# Patient Record
Sex: Male | Born: 1955
Health system: Southern US, Community
[De-identification: ages and names within clinical notes are randomized; demographics above are authoritative.]

## PROBLEM LIST (undated history)

## (undated) DIAGNOSIS — L219 Seborrheic dermatitis, unspecified: Secondary | ICD-10-CM

## (undated) DIAGNOSIS — N529 Male erectile dysfunction, unspecified: Secondary | ICD-10-CM

## (undated) DIAGNOSIS — Z8669 Personal history of other diseases of the nervous system and sense organs: Secondary | ICD-10-CM

## (undated) DIAGNOSIS — Z9989 Dependence on other enabling machines and devices: Secondary | ICD-10-CM

## (undated) DIAGNOSIS — E669 Obesity, unspecified: Secondary | ICD-10-CM

## (undated) DIAGNOSIS — Z8701 Personal history of pneumonia (recurrent): Secondary | ICD-10-CM

## (undated) DIAGNOSIS — K439 Ventral hernia without obstruction or gangrene: Secondary | ICD-10-CM

## (undated) DIAGNOSIS — G4733 Obstructive sleep apnea (adult) (pediatric): Secondary | ICD-10-CM

## (undated) DIAGNOSIS — K529 Noninfective gastroenteritis and colitis, unspecified: Secondary | ICD-10-CM

## (undated) DIAGNOSIS — I10 Essential (primary) hypertension: Secondary | ICD-10-CM

## (undated) DIAGNOSIS — F909 Attention-deficit hyperactivity disorder, unspecified type: Secondary | ICD-10-CM

## (undated) DIAGNOSIS — E119 Type 2 diabetes mellitus without complications: Secondary | ICD-10-CM

## (undated) DIAGNOSIS — E785 Hyperlipidemia, unspecified: Secondary | ICD-10-CM

## (undated) DIAGNOSIS — R030 Elevated blood-pressure reading, without diagnosis of hypertension: Secondary | ICD-10-CM

## (undated) DIAGNOSIS — K5090 Crohn's disease, unspecified, without complications: Secondary | ICD-10-CM

## (undated) DIAGNOSIS — K635 Polyp of colon: Secondary | ICD-10-CM

## (undated) DIAGNOSIS — K649 Unspecified hemorrhoids: Secondary | ICD-10-CM

## (undated) DIAGNOSIS — L02215 Cutaneous abscess of perineum: Secondary | ICD-10-CM

## (undated) DIAGNOSIS — Z973 Presence of spectacles and contact lenses: Secondary | ICD-10-CM

## (undated) DIAGNOSIS — I82401 Acute embolism and thrombosis of unspecified deep veins of right lower extremity: Secondary | ICD-10-CM

## (undated) DIAGNOSIS — J189 Pneumonia, unspecified organism: Secondary | ICD-10-CM

## (undated) DIAGNOSIS — G629 Polyneuropathy, unspecified: Secondary | ICD-10-CM

## (undated) DIAGNOSIS — R740 Nonspecific elevation of levels of transaminase and lactic acid dehydrogenase [LDH]: Secondary | ICD-10-CM

## (undated) DIAGNOSIS — R809 Proteinuria, unspecified: Secondary | ICD-10-CM

## (undated) DIAGNOSIS — E1129 Type 2 diabetes mellitus with other diabetic kidney complication: Secondary | ICD-10-CM

## (undated) DIAGNOSIS — F419 Anxiety disorder, unspecified: Secondary | ICD-10-CM

## (undated) HISTORY — DX: Seborrheic dermatitis, unspecified: L21.9

## (undated) HISTORY — PX: EYE SURGERY: SHX253

## (undated) HISTORY — DX: Cutaneous abscess of perineum: L02.215

## (undated) HISTORY — DX: Male erectile dysfunction, unspecified: N52.9

## (undated) HISTORY — DX: Anxiety disorder, unspecified: F41.9

## (undated) HISTORY — PX: COLONOSCOPY W/ POLYPECTOMY: SHX1380

## (undated) HISTORY — PX: TRANSTHORACIC ECHOCARDIOGRAM: SHX275

## (undated) HISTORY — DX: Elevated blood-pressure reading, without diagnosis of hypertension: R03.0

## (undated) HISTORY — DX: Hyperlipidemia, unspecified: E78.5

## (undated) HISTORY — DX: Attention-deficit hyperactivity disorder, unspecified type: F90.9

## (undated) HISTORY — DX: Dependence on other enabling machines and devices: Z99.89

## (undated) HISTORY — DX: Type 2 diabetes mellitus without complications: E11.9

## (undated) HISTORY — DX: Nonspecific elevation of levels of transaminase and lactic acid dehydrogenase (ldh): R74.0

## (undated) HISTORY — DX: Proteinuria, unspecified: R80.9

## (undated) HISTORY — DX: Proteinuria, unspecified: E11.29

## (undated) HISTORY — DX: Polyp of colon: K63.5

## (undated) HISTORY — DX: Obesity, unspecified: E66.9

## (undated) HISTORY — PX: MOUTH SURGERY: SHX715

## (undated) HISTORY — DX: Essential (primary) hypertension: I10

## (undated) HISTORY — DX: Obstructive sleep apnea (adult) (pediatric): G47.33

## (undated) HISTORY — DX: Unspecified hemorrhoids: K64.9

## (undated) HISTORY — DX: Crohn's disease, unspecified, without complications: K50.90

## (undated) HISTORY — DX: Noninfective gastroenteritis and colitis, unspecified: K52.9

## (undated) HISTORY — DX: Acute embolism and thrombosis of unspecified deep veins of right lower extremity: I82.401

## (undated) HISTORY — DX: Personal history of other diseases of the nervous system and sense organs: Z86.69

---

## 2005-10-08 ENCOUNTER — Emergency Department (HOSPITAL_COMMUNITY): Admission: EM | Admit: 2005-10-08 | Discharge: 2005-10-08 | Payer: Self-pay | Admitting: Family Medicine

## 2006-01-10 ENCOUNTER — Ambulatory Visit: Payer: Self-pay | Admitting: Internal Medicine

## 2006-12-09 ENCOUNTER — Ambulatory Visit: Payer: Self-pay | Admitting: Internal Medicine

## 2009-02-24 ENCOUNTER — Telehealth (INDEPENDENT_AMBULATORY_CARE_PROVIDER_SITE_OTHER): Payer: Self-pay | Admitting: *Deleted

## 2009-03-06 DIAGNOSIS — L259 Unspecified contact dermatitis, unspecified cause: Secondary | ICD-10-CM

## 2009-03-06 DIAGNOSIS — J309 Allergic rhinitis, unspecified: Secondary | ICD-10-CM

## 2009-03-07 ENCOUNTER — Ambulatory Visit: Payer: Self-pay | Admitting: Internal Medicine

## 2009-03-07 DIAGNOSIS — F909 Attention-deficit hyperactivity disorder, unspecified type: Secondary | ICD-10-CM

## 2010-02-06 ENCOUNTER — Ambulatory Visit: Payer: Self-pay | Admitting: Internal Medicine

## 2010-02-06 ENCOUNTER — Encounter: Payer: Self-pay | Admitting: Gastroenterology

## 2010-02-06 DIAGNOSIS — G479 Sleep disorder, unspecified: Secondary | ICD-10-CM | POA: Insufficient documentation

## 2010-02-06 LAB — CONVERTED CEMR LAB
ALT: 39 units/L (ref 0–53)
AST: 24 units/L (ref 0–37)
BUN: 17 mg/dL (ref 6–23)
Bilirubin Urine: NEGATIVE
CO2: 25 meq/L (ref 19–32)
Calcium: 9.5 mg/dL (ref 8.4–10.5)
Chloride: 103 meq/L (ref 96–112)
Cholesterol: 203 mg/dL — ABNORMAL HIGH (ref 0–200)
Creatinine, Ser: 0.8 mg/dL (ref 0.4–1.5)
Glucose, Bld: 105 mg/dL — ABNORMAL HIGH (ref 70–99)
Ketones, ur: NEGATIVE mg/dL
Leukocytes, UA: NEGATIVE
Lymphocytes Relative: 23.4 % (ref 12.0–46.0)
Lymphs Abs: 1.9 10*3/uL (ref 0.7–4.0)
MCHC: 35 g/dL (ref 30.0–36.0)
MCV: 98.9 fL (ref 78.0–100.0)
Neutro Abs: 5.3 10*3/uL (ref 1.4–7.7)
Neutrophils Relative %: 64.7 % (ref 43.0–77.0)
RDW: 12.7 % (ref 11.5–14.6)
TSH: 1.18 microintl units/mL (ref 0.35–5.50)
Total Bilirubin: 0.6 mg/dL (ref 0.3–1.2)
Total Protein, Urine: NEGATIVE mg/dL
Triglycerides: 85 mg/dL (ref 0.0–149.0)
pH: 5.5 (ref 5.0–8.0)

## 2010-02-07 ENCOUNTER — Telehealth (INDEPENDENT_AMBULATORY_CARE_PROVIDER_SITE_OTHER): Payer: Self-pay | Admitting: *Deleted

## 2010-02-22 ENCOUNTER — Ambulatory Visit (HOSPITAL_BASED_OUTPATIENT_CLINIC_OR_DEPARTMENT_OTHER)
Admission: RE | Admit: 2010-02-22 | Discharge: 2010-02-22 | Payer: Self-pay | Source: Home / Self Care | Attending: Internal Medicine | Admitting: Internal Medicine

## 2010-03-07 ENCOUNTER — Encounter (INDEPENDENT_AMBULATORY_CARE_PROVIDER_SITE_OTHER): Payer: Self-pay | Admitting: *Deleted

## 2010-03-07 ENCOUNTER — Telehealth (INDEPENDENT_AMBULATORY_CARE_PROVIDER_SITE_OTHER): Payer: Self-pay | Admitting: *Deleted

## 2010-03-13 ENCOUNTER — Ambulatory Visit: Payer: Self-pay | Admitting: Gastroenterology

## 2010-03-20 ENCOUNTER — Encounter: Payer: Self-pay | Admitting: Internal Medicine

## 2010-03-23 ENCOUNTER — Ambulatory Visit: Admit: 2010-03-23 | Payer: Self-pay | Admitting: Gastroenterology

## 2010-03-28 ENCOUNTER — Encounter: Payer: Self-pay | Admitting: Internal Medicine

## 2010-03-30 ENCOUNTER — Ambulatory Visit
Admission: RE | Admit: 2010-03-30 | Discharge: 2010-03-30 | Payer: Self-pay | Source: Home / Self Care | Attending: Pulmonary Disease | Admitting: Pulmonary Disease

## 2010-03-30 DIAGNOSIS — G4733 Obstructive sleep apnea (adult) (pediatric): Secondary | ICD-10-CM | POA: Insufficient documentation

## 2010-04-17 NOTE — Progress Notes (Signed)
Summary: ? ritalin rx  ---- Converted from flag ---- ---- 02/07/2010 6:58 AM, Nyoka Cowden MD wrote: I forgot to give him the info on the phone tree - I put all the info on it ------------------------------  Prescriptions: RITALIN SR 20 MG CR-TABS (METHYLPHENIDATE HCL) one twice daily as needed  #60 x 0   Entered by:   Vernie Murders   Authorized by:   Nyoka Cowden MD   Signed by:   Vernie Murders on 02/09/2010   Method used:   Print then Give to Patient   RxID:   [id number]  I spoke with pt and made aware that all results are on PT and gave number to call.  He verbalized understanding.  He states that he was supposed to have been given rx for ritalin at [facility] and this was not done.  Pls advise thanks! Vernie Murders  February 07, 2010 12:08 PM ok to fill x  one year \  Appended Document: ? ritalin rx Rx mailed to pt per his request.

## 2010-04-17 NOTE — Letter (Signed)
Summary: Pre Visit Letter Revised  [facility]  [address] [place], [place] [address]   Phone: [phone]  Fax: [phone]        02/06/2010 MRN: [id number]  Richard [name] [address] [place], [place]  [address]             Procedure Date:  03-23-2010 9am           Dr Arlyce Dice   Welcome to the Gastroenterology Division at [facility].    You are scheduled to see a nurse for your pre-procedure visit on 03-13-10 at 10:30am on the 3rd floor at [facility], [address]  We ask that you try to arrive at our office 15 minutes prior to your appointment time to allow for check-in.  Please take a minute to review the attached form.  If you answer "Yes" to one or more of the questions on the first page, we ask that you call the person listed at your earliest opportunity.  If you answer "No" to all of the questions, please complete the rest of the form and bring it to your appointment.    Your nurse visit will consist of discussing your medical and surgical history, your immediate family medical history, and your medications.   If you are unable to list all of your medications on the form, please bring the medication bottles to your appointment and we will list them.  We will need to be aware of both prescribed and over the counter drugs.  We will need to know exact dosage information as well.    Please be prepared to read and sign documents such as consent forms, a financial agreement, and acknowledgement forms.  If necessary, and with your consent, a friend or relative is welcome to sit-in on the nurse visit with you.  Please bring your insurance card so that we may make a copy of it.  If your insurance requires a referral to see a specialist, please bring your referral form from your primary care physician.  No co-pay is required for this nurse visit.     If you cannot keep your appointment, please call [phone] to cancel or reschedule prior to your  appointment date.  This allows [place] the opportunity to schedule an appointment for another patient in need of care.    Thank you for choosing [facility] for your medical needs.  We appreciate the opportunity to care for you.  Please visit [place] at our website  to learn more about our practice.  Sincerely, The Gastroenterology Division

## 2010-04-17 NOTE — Assessment & Plan Note (Signed)
Summary: Primary svc/ cpx    Primary Provider/Referring Provider:  Sherene Sires  CC:  Rash-resolved.  [name] Kitchen  History of Present Illness: 71  yowm with moderate obesity and h/o mild eczema.  March 07, 2009  to re-establish Acute visit.  Pt c/o itchy rash on left wrist x 2 wks.  Also concerned about "knot" on right  knee.  Also he has noticed some numbess in right hand x 6 months- relates to working with computers.     February 06, 2010 cpx  cc excessive sleepiness, no cp or sob. Pt denies any significant sore throat, dysphagia, itching, sneezing,  nasal congestion or excess secretions,  fever, chills, sweats, unintended wt loss, pleuritic or exertional cp, hempoptysis, change in activity tolerance  orthopnea pnd or leg swelling   Current Medications (verified): 1)  Ritalin Sr 20 Mg Cr-Tabs (Methylphenidate Hcl) .... One Twice Daily As Needed 2)  Triamcinolone Acetonide 0.1 % Crea (Triamcinolone Acetonide) .... Apply Twice Daily As Needed  Allergies (verified): No Known Drug Allergies  Past History:  Past Medical History: ALLERGIC RHINITIS (ICD-477.9) DERMATITIS (ICD-692.9) OBESITY    - Target wt  =   208  for BMI < 30  ? OSA      - PSS ordered February 06, 2010  Health Maintenance...................................[name] KitchenWert     - Td February 06, 2010     - Colonoscopy ordered February 06, 2010      - CPX February 06, 2010 declined  psa study  Family History: Reviewed history from 03/06/2009 and no changes required. cancer: father (lung)   Social History: Reviewed history from 03/07/2009 and no changes required. Patient states former smoker.  Quit May 2006. Pt works as an Special educational needs teacher. ETOH daily Married  Vital Signs:  Patient profile:   55 year old male Weight:      268 pounds BMI:     37.51 O2 Sat:      93 % on Room air Pulse rate:   115 / minute BP sitting:   128 / 80  (left arm)  Vitals Entered By: Vernie Murders (February 06, 2010 10:40 AM)  O2 Flow:  Room  air  Physical Exam  Additional Exam:   Ambulatory obese wm  in no acute distress.  wt 265 > 268 February 06, 2010  HEENT: nl dentition, turbinates, and orophanx. Nl external ear canals without cough reflex Neck without JVD/Nodes/TM Lungs clear to A and P bilaterally without cough on insp or exp maneuvers RRR no s3 or murmur or increase in P2 Abd soft and benign with nl excursion in the supine position. No bruits or organomegaly Ext warm without calf tenderness, cyanosis clubbing or edema Skin warm and dry with seb keratoses Neuro nl sensorium, no nl gait, no motor deficits MS  no joint limitations.   Cholesterol          [H]  203 mg/dL                   1-610     ATP III Classification            Desirable:  < 200 mg/dL                    Borderline High:  200 - 239 mg/dL               High:  > = 240 mg/dL   Triglycerides             85.0 mg/dL  0.0-149.0     Normal:  <150 mg/dL     Borderline High:  161 - 199 mg/dL   HDL                       09.60 mg/dL                 >45.40   VLDL Cholesterol          17.0 mg/dL                  9.8-11.9  CHO/HDL Ratio:  CHD Risk                             4                    Men          Women     1/2 Average Risk     3.4          3.3     Average Risk          5.0          4.4     2X Average Risk          9.6          7.1     3X Average Risk          15.0          11.0                           Tests: (2) BMP (METABOL)   Sodium                    138 mEq/L                   135-145   Potassium                 4.5 mEq/L                   3.5-5.1   Chloride                  103 mEq/L                   96-112   Carbon Dioxide            25 mEq/L                    19-32   Glucose              [H]  105 mg/dL                   14-78   BUN                       17 mg/dL                    2-95   Creatinine                0.8 mg/dL                   6.2-1.3   Calcium  9.5 mg/dL                   5.7-32.2    GFR                       110.31 mL/min               >60  Tests: (3) CBC Platelet w/Diff (CBCD)   White Cell Count          8.1 K/uL                    4.5-10.5   Red Cell Count            4.61 Mil/uL                 4.22-5.81   Hemoglobin                15.9 g/dL                   02.5-42.7   Hematocrit                45.6 %                      39.0-52.0   MCV                       98.9 fl                     78.0-100.0   MCHC                      35.0 g/dL                   06.2-37.6   RDW                       12.7 %                      11.5-14.6   Platelet Count            242.0 K/uL                  150.0-400.0   Neutrophil %              64.7 %                      43.0-77.0   Lymphocyte %              23.4 %                      12.0-46.0   Monocyte %                8.7 %                       3.0-12.0   Eosinophils%              2.8 %                       0.0-5.0   Basophils %               0.4 %  0.0-3.0   Neutrophill Absolute      5.3 K/uL                    1.4-7.7   Lymphocyte Absolute       1.9 K/uL                    0.7-4.0   Monocyte Absolute         0.7 K/uL                    0.1-1.0  Eosinophils, Absolute                             0.2 K/uL                    0.0-0.7   Basophils Absolute        0.0 K/uL                    0.0-0.1  Tests: (4) Hepatic/Liver Function Panel (HEPATIC)   Total Bilirubin           0.6 mg/dL                   0.3-4.7   Direct Bilirubin          0.1 mg/dL                   4.2-5.9   Alkaline Phosphatase      67 U/L                      39-117   AST                       24 U/L                      0-37   ALT                       39 U/L                      0-53   Total Protein             7.0 g/dL                    5.6-3.8   Albumin                   4.1 g/dL                    7.5-6.4  Tests: (5) TSH (TSH)   FastTSH                   1.18 uIU/mL                 0.35-5.50  Tests: (6) UDip Only (UDIP)   Color                      LT. YELLOW       RANGE:  Yellow;Lt. Yellow   Clarity                   CLEAR  Clear   Specific Gravity          1.025                       1.000 - 1.030   Urine Ph                  5.5                         5.0-8.0   Protein                   NEGATIVE                    Negative   Urine Glucose             NEGATIVE                    Negative   Ketones                   NEGATIVE                    Negative   Urine Bilirubin           NEGATIVE                    Negative   Blood                     NEGATIVE                    Negative   Urobilinogen              0.2                         0.0 - 1.0   Leukocyte Esterace        NEGATIVE                    Negative   Nitrite                   NEGATIVE                    Negative  Tests: (7) Cholesterol LDL - Direct (DIRLDL)  Cholesterol LDL - Direct                             131.3 mg/dL     Optimal:  <295 mg/dL     Near or Above Optimal:  100-129 mg/dL     Borderline High:  621-308 mg/dL     High:  657-846 mg/dL     Very High:  >962 mg/dL  CXR  Procedure date:  02/06/2010  Findings:        Findings: There is cardiomegaly but no edema.  Lungs are clear.  No effusion.   IMPRESSION: Cardiomegaly without acute disease.  Impression & Recommendations:  Problem # 1:  ATTENTION DEFICIT DISORDER, ADULT (ICD-314.00) ok to continue rx  Problem # 2:  SLEEP DISORDER UNSPECIFIED (ICD-780.50)  needs formal sleep study  Problem # 3:  MORBID OBESITY (ICD-278.01)  Weight control is a matter of calorie balance which needs to be tilted in the pt's favor by eating less and exercising more.  Specifically, I recommended  exercise  at a level where pt  is short of breath but not out of breath 30 minutes daily.  If not losing weight on this program, I would strongly recommend pt see a nutritionist with a food diary recorded for two weeks prior to the visit.     Medications Added to Medication List This  Visit: 1)  Ritalin Sr 20 Mg Cr-tabs (Methylphenidate hcl) .... One twice daily as needed 2)  Triamcinolone Acetonide 0.1 % Crea (Triamcinolone acetonide) .... Apply twice daily as needed  Other Orders: Misc. Referral (Misc. Ref) Misc. Referral (Misc. Ref) Tdap => 59yrs IM (40981) Admin 1st Vaccine (19147) Admin 1st Vaccine (State) [phone]) TLB-Lipid Panel (80061-LIPID) TLB-BMP (Basic Metabolic Panel-BMET) (80048-METABOL) TLB-CBC Platelet - w/Differential (85025-CBCD) TLB-Hepatic/Liver Function Pnl (80076-HEPATIC) TLB-TSH (Thyroid Stimulating Hormone) (84443-TSH) TLB-Udip ONLY (81003-UDIP) Est. Patient 40-64 years (13086) T-2 View CXR (71020TC)  Patient Instructions: 1)  Weight control is simply a matter of calorie balance which needs to be tilted in your favor by eating less and exercising more.  To get the most out of exercise, you need to be continuously aware that you are short of breath, but never out of breath, for 30 minutes daily. As you improve, it will actually be easier for you to do the same amount in  30 minutes so always push to the level where you are short of breath.  If this does not result in gradual weight reduction,  I recommend  a nutritionist for a food diary 2)  See Patient Care Coordinator before leaving for colonsocopy and sleep study.   Tetanus/Td Vaccine    Vaccine Type: Tdap    Site: left deltoid    Mfr: GlaxoSmithKline    Dose: 0.5 ml    Route: IM    Given by: Michel Bickers CMA    Exp. Date: 06/10/2011    Lot #: [id number]

## 2010-04-19 NOTE — Miscellaneous (Signed)
Summary: [facility] Previsit/prep  Clinical Lists Changes  Medications: Added new medication of MOVIPREP 100 GM  SOLR (PEG-KCL-NACL-NASULF-NA ASC-C) As per prep instructions. - Signed Rx of MOVIPREP 100 GM  SOLR (PEG-KCL-NACL-NASULF-NA ASC-C) As per prep instructions.;  #1 x 0;  Signed;  Entered by: Wyona Almas RN;  Authorized by: Louis Meckel MD;  Method used: Electronically to [facility]  [phone]*, [address] [place], [place], [place]  [address], Ph: [phone] or [phone], Fax: [phone] Observations: Added new observation of NKA: T (03/13/2010 10:21)    Prescriptions: MOVIPREP 100 GM  SOLR (PEG-KCL-NACL-NASULF-NA ASC-C) As per prep instructions.  #1 x 0   Entered by:   Wyona Almas RN   Authorized by:   Louis Meckel MD   Signed by:   Wyona Almas RN on 03/13/2010   Method used:   [facility]ronically to        [facility]  [phone][facility] (retail)       [address]       [place], [place]  [address]       Ph: [phone] or [phone]       Fax: [phone]   RxID:   [id number]

## 2010-04-19 NOTE — Progress Notes (Signed)
Summary: Severe sleep apnea, refer to [facility]  Phone Note Outgoing Call   Summary of Call: let him know he has severe sleep apnea and needs to get started on 10 cm and get set up with [facility] for next available sleep consult Initial call taken by: Nyoka Cowden MD,  March 07, 2010 1:39 PM  Follow-up for Phone Call        appt to see dr clance 03/30/10@1 :30pm pt aware of this appt Follow-up by: Oneita Jolly,  March 07, 2010 3:47 PM

## 2010-04-19 NOTE — Letter (Signed)
Summary: [facility] Instructions  [facility]  [address] [place], [place] [address]   Phone: [phone]  Fax: [phone]       Richard [name]    Mar 21, 1955    MRN: [id number]        Procedure Day Dorna Bloom:  Farrell Ours  04/24/10     Arrival Time: 10:30AM     Procedure Time: 11:30AM     Location of Procedure:                    Juliann Pares _  [facility] (4th Floor)                      PREPARATION FOR COLONOSCOPY WITH MOVIPREP   Starting 5 days prior to your procedure 04/19/10 do not eat nuts, seeds, popcorn, corn, beans, peas,  salads, or any raw vegetables.  Do not take any fiber supplements (e.g. Metamucil, Citrucel, and Benefiber).  THE DAY BEFORE YOUR PROCEDURE         DATE: 04/23/10  DAY: THURSDAY  1.  Drink clear liquids the entire day-NO SOLID FOOD  2.  Do not drink anything colored red or purple.  Avoid juices with pulp.  No orange juice.  3.  Drink at least 64 oz. (8 glasses) of fluid/clear liquids during the day to prevent dehydration and help the prep work efficiently.  CLEAR LIQUIDS INCLUDE: Water Jello Ice Popsicles Tea (sugar ok, no milk/cream) Powdered fruit flavored drinks Coffee (sugar ok, no milk/cream) Gatorade Juice: apple, white grape, white cranberry  Lemonade Clear bullion, consomm, broth Carbonated beverages (any kind) Strained chicken noodle soup Hard Candy                             4.  In the morning, mix first dose of MoviPrep solution:    Empty 1 Pouch A and 1 Pouch B into the disposable container    Add lukewarm drinking water to the top line of the container. Mix to dissolve    Refrigerate (mixed solution should be used within 24 hrs)  5.  Begin drinking the prep at 5:00 p.m. The MoviPrep container is divided by 4 marks.   Every 15 minutes drink the solution down to the next mark (approximately 8 oz) until the full liter is complete.   6.  Follow completed prep with 16 oz of clear liquid of your choice (Nothing red  or purple).  Continue to drink clear liquids until bedtime.  7.  Before going to bed, mix second dose of MoviPrep solution:    Empty 1 Pouch A and 1 Pouch B into the disposable container    Add lukewarm drinking water to the top line of the container. Mix to dissolve    Refrigerate  THE DAY OF YOUR PROCEDURE      DATE: 04/24/10   DAY: FRIDAY  Beginning at 6:30AM (5 hours before procedure):         1. Every 15 minutes, drink the solution down to the next mark (approx 8 oz) until the full liter is complete.  2. Follow completed prep with 16 oz. of clear liquid of your choice.    3. You may drink clear liquids until 9:30AM (2 HOURS BEFORE PROCEDURE).   MEDICATION INSTRUCTIONS  Unless otherwise instructed, you should take regular prescription medications with a small sip of water   as early as possible the morning of your procedure.  OTHER INSTRUCTIONS  You will need a responsible adult at least 54 years of age to accompany you and drive you home.   This person must remain in the waiting room during your procedure.  Wear loose fitting clothing that is easily removed.  Leave jewelry and other valuables at home.  However, you may wish to bring a book to read or  an iPod/MP3 player to listen to music as you wait for your procedure to start.  Remove all body piercing jewelry and leave at home.  Total time from sign-in until discharge is approximately 2-3 hours.  You should go home directly after your procedure and rest.  You can resume normal activities the  day after your procedure.  The day of your procedure you should not:   Drive   Make legal decisions   Operate machinery   Drink alcohol   Return to work  You will receive specific instructions about eating, activities and medications before you leave.    The above instructions have been reviewed and explained to me by   Wyona Almas RN  March 13, 2010 11:08 AM     I fully understand and can  verbalize these instructions _____________________________ Date _________

## 2010-04-19 NOTE — Letter (Signed)
Summary: [facility] for Oxygen / [facility]  CMN for Oxygen / [facility]   Imported By: Lennie Odor 04/06/2010 16:37:00  _____________________________________________________________________  External Attachment:    Type:   Image     Comment:   External Document

## 2010-04-19 NOTE — Assessment & Plan Note (Signed)
Summary: consult for management of osa   Visit Type:  Initial Consult Copy to:  Sandrea Hughs MD Primary Provider/Referring Provider:  Sandrea Hughs MD  CC:  Sleep Consult. pt currently on cpap x 1 week. pt states he wears cpap everynight x 5-7 hrs a night. .  History of Present Illness: The pt is a 55y/o male who I have been asked to see for management of osa.  He recently underwent npsg, which showed very severe osa with AHI 94/hr and desat as low as 75%.  The pt notes that he has had loud snoring, fragmented and nonrestorative sleep, and significant daytime sleepiness with periods of inactivity.  He has been started on cpap on auto mode with full face mask, and has seen a significant improvement in his sleep and daytime alertness.  He states that it is almost "life changing".  He currently denies sleepiness with inactivity during the day, and does not get sleepy driving.  He does not have issues with mask fit currently or pressure.    Current Medications (verified): 1)  Ritalin Sr 20 Mg Cr-Tabs (Methylphenidate Hcl) .... One Twice Daily As Needed 2)  Triamcinolone Acetonide 0.1 % Crea (Triamcinolone Acetonide) .... Apply Twice Daily As Needed  Allergies (verified): No Known Drug Allergies  Past History:  Past Medical History: ALLERGIC RHINITIS (ICD-477.9) DERMATITIS (ICD-692.9) OBESITY    - Target wt  =   208  for BMI < 30  OSA--AHI 94/hr with desat 75%      - PSS ordered February 06, 2010  Health Maintenance...................................[name] KitchenWert     - Td February 06, 2010     - Colonoscopy ordered February 06, 2010      - CPX February 06, 2010 declined  psa study  Past Surgical History: none  Family History: Reviewed history from 03/06/2009 and no changes required. cancer: father (lung and nasal) Allergies: father  Social History: Reviewed history from 03/07/2009 and no changes required. Patient states former smoker.  Quit May 2004. 1 ppd. 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  The patient denies shortness of breath with activity, shortness of breath at rest, productive cough, non-productive cough, coughing up blood, chest pain, irregular heartbeats, indigestion, loss of appetite, weight change, abdominal pain, difficulty swallowing, sore throat, tooth/dental problems, headaches, sneezing, itching, ear ache, depression, hand/feet swelling, joint stiffness or pain, rash, change in color of mucus, and fever.    Vital Signs:  Patient profile:   55 year old male Height:      71 inches Weight:      274.13 pounds BMI:     38.37 O2 Sat:      95 % on Room air Temp:     98.4 degrees F oral Pulse rate:   100 / minute BP sitting:   122 / 78  (left arm) Cuff size:   large  Vitals Entered By: Carver Fila (March 30, 2010 1:26 PM)  O2 Flow:  Room air CC: Sleep Consult. pt currently on cpap x 1 week. pt states he wears cpap everynight x 5-7 hrs a night.  Comments meds and allergies updated Phone number updated Carver Fila  March 30, 2010 1:26 PM    Physical Exam  General:  obese male in nad Eyes:  PERRLA and EOMI.   Nose:  narrowed but patent.  no  purulence or crusting noted.   Mouth:  severe soft tissue redundancy in posterior pharynx, with crowding.  Elongation of soft palate and uvula Neck:  no jvd, tmg, LN Lungs:  clear to auscultation Heart:  rrr, no mrg Abdomen:  soft and nontender, bs+ Extremities:  mild edema, no cyanosis  pulses intact distally Neurologic:  alert and oriented, moves all 4.   Impression & Recommendations:  Problem # 1:  OBSTRUCTIVE SLEEP APNEA (ICD-327.23) the pt has very severe osa that is much improved since he has been on cpap.  He has seen a dramatic improvment in his sleep and daytime alertness.  He is having no issue with his pressure or mask  fit, but currently is on auto mode.  Most pt's prefer a set pressure ultimately, and therefore would like to get a download off his current machine in order to establish his set pressure.  Once this is done, the pt can decide for himself which mode he prefers.  I have also encouraged him to work on weight reduction.  Other Orders: Consultation Level IV (11914) DME Referral (DME)  Patient Instructions: 1)  will get your pressure optimized for you over the next few weeks.  I will let you know your optimal pressure. 2)  work on weight loss 3)  followup with me in 6mos, but call if having issues.

## 2010-04-24 ENCOUNTER — Other Ambulatory Visit: Payer: Self-pay | Admitting: Gastroenterology

## 2010-04-24 ENCOUNTER — Other Ambulatory Visit (AMBULATORY_SURGERY_CENTER): Payer: BC Managed Care – PPO | Admitting: Gastroenterology

## 2010-04-24 DIAGNOSIS — K5730 Diverticulosis of large intestine without perforation or abscess without bleeding: Secondary | ICD-10-CM

## 2010-04-24 DIAGNOSIS — D126 Benign neoplasm of colon, unspecified: Secondary | ICD-10-CM

## 2010-04-24 DIAGNOSIS — Z1211 Encounter for screening for malignant neoplasm of colon: Secondary | ICD-10-CM

## 2010-05-01 ENCOUNTER — Encounter: Payer: Self-pay | Admitting: Gastroenterology

## 2010-05-05 ENCOUNTER — Encounter: Payer: Self-pay | Admitting: Pulmonary Disease

## 2010-05-09 NOTE — Procedures (Signed)
Summary: Colonoscopy   Colonoscopy  Procedure date:  04/24/2010  Findings:      Location:  [facility].   COLONOSCOPY PROCEDURE REPORT  PATIENT:  Richard [name], Richard [name]  MR#:  [id number] BIRTHDATE:   07/24/1955, 55 yrs. old   GENDER:   male  ENDOSCOPIST:   Barbette Hair. Arlyce Dice, MD Referred by: Charlaine Dalton. Sherene Sires, M.D.  PROCEDURE DATE:  04/24/2010 PROCEDURE:  Colonoscopy with polypectomy and submucosal injection, Colon with cold biopsy polypectomy ASA CLASS:   Class II INDICATIONS: 1) Routine Risk Screening   MEDICATIONS:    Fentanyl 75 mcg IV, Versed 10 mg IV  DESCRIPTION OF PROCEDURE:   After the risks benefits and alternatives of the procedure were thoroughly explained, informed consent was obtained.  Digital rectal exam was performed and revealed no abnormalities.   The  endoscope was introduced through the anus and advanced to the cecum, which was identified by both the appendix and ileocecal valve, without limitations.  The quality of the prep was good, using MoviPrep.  The instrument was then slowly withdrawn as the colon was fully examined. <<PROCEDUREIMAGES>>                        <<OLD IMAGES>>  FINDINGS:  A sessile polyp was found in the ascending colon. It was 2 cm in size. Largest portion of polyp could not be located. Remnant of the polyp was removed with hot bx forceps, submitted to pathology submucosal injection Polyp was snared, then cauterized with monopolar cautery. Retrieval was unsuccessful (see image4 and image6). snare polyp 7cc NS  Two polyps were found in the descending colon (see image9 and image14). 1cm and 5mm polyps - removed with cold polypectomy snare, submitted to patholgy  There were multiple polyps identified and removed. in the sigmoid colon. Multiple smooth hyperplastic appearing polyps (see image16). 2 polyps measuring 4mm adenomatous appearing) at 15cm were removed and submitted to pathology  Moderate diverticulosis was found in the sigmoid to  descending colon segments (see image10).  This was otherwise a normal examination of the colon (see image2, image5, image7, image8, image17, and image18).  A diverticulum was found in the ascending colon (see image3).   Retroflexed views in the rectum revealed no abnormalities.    The time to cecum =  2.25  minutes. The scope was then withdrawn (time =  26.50  min) from the patient and the procedure completed.  COMPLICATIONS:   None  ENDOSCOPIC IMPRESSION:  1) 2 cm sessile polyp in the ascending colon  2) Two polyps in the descending colon  3) Polyps, multiple in the sigmoid colon  4) Moderate diverticulosis in the sigmoid to descending colon segments  5) Diverticulum in the ascending colon  6) Otherwise normal examination RECOMMENDATIONS:  1) Colonoscopy 1 year due to multiplicity and size of polyps  REPEAT EXAM:    1 year(s) Colonoscopy   _______________________________ Barbette Hair. Arlyce Dice, MD    CC:     Appended Document: Colonoscopy     Procedures Next Due Date:    Colonoscopy: 04/2011

## 2010-05-09 NOTE — Letter (Signed)
Summary: Patient Notice- Polyp Results  [facility]  [address] [place], [place] [address]   Phone: [phone]  Fax: [phone]        May 01, 2010 MRN: [id number]    Richard PARISEAU [address] [place], [place]  [address]    Dear Mr. FERNS,  I am pleased to inform you that the colon polyp(s) removed during your recent colonoscopy was (were) found to be benign (no cancer detected) upon pathologic examination.  I recommend you have a repeat colonoscopy examination in 1_ years to look for recurrent polyps, as having colon polyps increases your risk for having recurrent polyps or even colon cancer in the future.  Should you develop new or worsening symptoms of abdominal pain, bowel habit changes or bleeding from the rectum or bowels, please schedule an evaluation with either your primary care physician or with me.  Additional information/recommendations:  __ No further action with gastroenterology is needed at this time. Please      follow-up with your primary care physician for your other healthcare      needs.  __ Please call [phone] to schedule a return visit to review your      situation.  __ Please keep your follow-up visit as already scheduled.  _x_ Continue treatment plan as outlined the day of your exam.  Please call us if you are having persistent problems or have questions about your condition that have not been fully answered at this time.  Sincerely,  Louis Meckel MD  This letter has been electronically signed by your physician.  Appended Document: Patient Notice- Polyp Results letter mailed

## 2010-05-09 NOTE — Miscellaneous (Signed)
Summary: optimal pressure 10cm  Clinical Lists Changes  Orders: Added new Referral order of DME Referral (DME) - Signed optimal pressure 10cm, no big leaks, good compliance

## 2010-07-02 ENCOUNTER — Telehealth: Payer: Self-pay | Admitting: Internal Medicine

## 2010-07-02 NOTE — Telephone Encounter (Signed)
Pt states he was just here a few months ago but would now like RX for either Viagra or Cialis. Pt would like to speak with MW if possible. Pt aware MW is not in the office this afternoon and is okay to wait for callback.

## 2010-07-03 MED ORDER — SILDENAFIL CITRATE 50 MG PO TABS: 50.0000 mg | ORAL_TABLET | Freq: Every day | ORAL | Status: DC | PRN

## 2010-07-03 NOTE — Telephone Encounter (Signed)
Discussed in detail all the  indications, usual  risks and alternatives  relative to the benefits with patient who agrees to proceed with trial of viagra 50 taken one hour before activity

## 2010-07-05 ENCOUNTER — Telehealth: Payer: Self-pay | Admitting: Internal Medicine

## 2010-07-05 NOTE — Telephone Encounter (Signed)
Per phone note on 07/02/2010, MW ok'd to start pt on Viagra and rx was sent to pharmacy.  However, pt is stating the pharmacy never received this rx.  Therefore [facility].  Pt aware.

## 2010-07-09 ENCOUNTER — Telehealth: Payer: Self-pay | Admitting: *Deleted

## 2010-07-09 MED ORDER — SILDENAFIL CITRATE 50 MG PO TABS: 50.0000 mg | ORAL_TABLET | Freq: Every day | ORAL | Status: DC | PRN

## 2010-07-09 NOTE — Telephone Encounter (Signed)
Member ID # [id number]. Pt's plan will only cover 6 pills in a 30 day period. New rx called to the pharmacy to reflect this and pt is aware.

## 2010-07-10 ENCOUNTER — Telehealth: Payer: Self-pay | Admitting: Pulmonary Disease

## 2010-07-10 NOTE — Telephone Encounter (Signed)
Spoke with Richard [name] and she states that even the quantity of 6 pills for Viagra would require PA. She also spoke with [employer] and they would not initiate PA with her. [facility] [phone]. Member ID # [id number]. Called for PA and awaiting faxed form.

## 2010-07-10 NOTE — Telephone Encounter (Signed)
PA form received and placed in MW look at. Will forward to Taylor so she is aware.

## 2010-07-12 NOTE — Telephone Encounter (Signed)
PA completed and faxed back to [employer].

## 2010-07-13 ENCOUNTER — Telehealth: Payer: Self-pay | Admitting: *Deleted

## 2010-07-13 NOTE — Telephone Encounter (Signed)
Pt is aware of denial for Viagra and has already picked up the prescription and paid for it out of pocket. Nothing further is need.

## 2010-07-31 NOTE — Assessment & Plan Note (Signed)
Richard [name]                             PULMONARY OFFICE NOTE   Richard [name], Richard [name]                       MRN:          [id number]  DATE:12/09/2006                            DOB:          07/01/1955    HISTORY OF PRESENT ILLNESS:  The patient is a 55 year old white male  patient of Dr. Thurston Hole who has a history of attention-deficit disorder,  mild hypertension - diet controlled that presents today for an acute  office visit. The patient complains of a one-week history of productive  cough with thick, white-yellow sputum, nasal congestion, fever, chills  and wheezing. The patient denies any hemoptysis, orthopnea, PND or leg  swelling.   PAST MEDICAL HISTORY:  Reviewed.   CURRENT MEDICATIONS:  Reviewed.   PHYSICAL EXAMINATION:  The patient is a pleasant male in no acute  distress. He is afebrile with stable vital signs. O2 saturation 93% on  room air.  HEENT:  Nasal mucosa with mild erythema. Nontender sinuses. Posterior  pharynx is clear.  NECK:  Is supple without adenopathy. No JVD.  LUNGS:  Sounds reveal some course rhonchi bilaterally with a few  expiratory wheezes.  CARDIAC:  Regular rate.  ABDOMEN:  Morbidly obese and soft.  EXTREMITIES:  Warm without any edema.   IMPRESSION AND PLAN:  Acute tracheobronchitis. The patient is to begin  Augmentin for 7 days. Mucinex DM twice daily. Prednisone taper over the  next week. A Xopenex nebulizer treatment was given in the office. Chest  x-ray is pending at time of dictation. Will follow up accordingly. The  patient is to return back in two weeks with Dr. Sherene Sires or sooner if  needed. The patient is to contact the office if symptoms do not improve  or worsen.      Rubye Oaks, NP  Electronically Signed      Charlaine Dalton. Sherene Sires, MD, [facility]  Electronically Signed   TP/MedQ  DD: 12/09/2006  DT: 12/10/2006  Job #: [id number]

## 2010-08-03 NOTE — Assessment & Plan Note (Signed)
[name]                               PULMONARY OFFICE NOTE   Richard [name], Richard [name]                       MRN:          [id number]  DATE:01/10/2006                            DOB:          [birth year]    HISTORY:  A 55 year old white male in for evaluation of an itchy rash on the  hand for the last two months.  He has tried antifungals and over-the-counter  cortisone with minimal improvement.  He has no previous history of eczema or  unusual exposure.   In addition, he has been having problems with nasal congestion for the last  month or two with slightly unusual smell sensation.  He denies the  appearance of secretions, sinus pain or cough.   PHYSICAL EXAMINATION:  GENERAL:  He is a pleasant, ambulatory, white male in  no acute distress.  VITAL SIGNS:  Stable.  HEENT:  Moderate nonspecific turbinate edema with minimal mucoid secretions  present.  Oropharynx is clear with no cobblestoning, tonsillar enlargement.  NECK:  No cervical adenopathy.  Trachea benign.  CHEST:  Lung fields clear bilaterally to auscultation and percussion.  HEART:  Regular rhythm without murmur, gallop or rub.  ABDOMEN:  Soft, benign.  EXTREMITIES:  Warm without calf tenderness, cyanosis or clubbing or edema.  He did have one patch of scaly erythema over the dorsum of the middle finger  and several satellite lesions as well similar to it.   IMPRESSION:  1. Dermatitis of unknown clear etiology, possibly eczematous, possibly      fungal.  Recommended Lotrisone cream b.i.d. for 14 days and followup by      dermatology if needed.  2. Acute rhinitis superimposed on a patient who has a history of allergic      rhinitis.  Recommended nasal saline, Advil Cold and Sinus and five to      10 day course of Omnicef (five days if better, 10 days if not) and a      sinus CT scan to follow this up if not better after 10 days of Omnicef.  3. Chart read indicates the patient is due for  a comprehensive medical      evaluation which is at age 26 which will come up in January 2008.  We      will see him sooner if needed.    ______________________________  Charlaine Dalton Sherene Sires, MD, [facility]    MBW/MedQ  DD: 01/10/2006  DT: 01/11/2006  Job #: [id number]

## 2010-09-25 ENCOUNTER — Encounter: Payer: Self-pay | Admitting: Pulmonary Disease

## 2010-09-26 ENCOUNTER — Encounter: Payer: Self-pay | Admitting: Pulmonary Disease

## 2010-09-26 ENCOUNTER — Ambulatory Visit (INDEPENDENT_AMBULATORY_CARE_PROVIDER_SITE_OTHER): Payer: BC Managed Care – PPO | Admitting: Pulmonary Disease

## 2010-09-26 VITALS — BP 124/82 | HR 105 | Temp 99.1°F | Ht 71.0 in | Wt 273.0 lb

## 2010-09-26 DIAGNOSIS — G4733 Obstructive sleep apnea (adult) (pediatric): Secondary | ICD-10-CM

## 2010-09-26 NOTE — Progress Notes (Signed)
  Subjective:    Patient ID: Richard [name], male    DOB: 1955/06/22, 55 y.o.   MRN: [id number]  HPI The pt comes in today for f/u of his known osa.  He is wearing cpap compliantly, and feels he is sleeping well with adequate daytime alertness.  His only complaint is that his mask has to be pulled tightly in order to keep from leaking, and leaves "marks on my face".  I have asked him to discuss trying different masks with his dme.    Review of Systems  Constitutional: Negative for fever and unexpected weight change.  HENT: Negative for ear pain, nosebleeds, congestion, sore throat, rhinorrhea, sneezing, trouble swallowing, dental problem, postnasal drip and sinus pressure.   Eyes: Negative for redness and itching.  Respiratory: Negative for cough, chest tightness, shortness of breath and wheezing.   Cardiovascular: Negative for palpitations and leg swelling.  Gastrointestinal: Negative for nausea and vomiting.  Genitourinary: Negative for dysuria.  Musculoskeletal: Negative for joint swelling.  Skin: Negative for rash.  Neurological: Negative for headaches.  Hematological: Does not bruise/bleed easily.  Psychiatric/Behavioral: Negative for dysphoric mood. The patient is not nervous/anxious.        Objective:   Physical Exam Obese male in nad No skin breakdown or pressure necrosis from cpap mask LE without edema, no cyanosis noted. Alert, does not appear sleepy, moves all 4        Assessment & Plan:

## 2010-09-26 NOTE — Patient Instructions (Signed)
Continue with cpap, but try different masks to see if better fit. Work on weight loss followup with me in one year.

## 2010-09-26 NOTE — Assessment & Plan Note (Addendum)
The pt is doing well with cpap, and has seen great improvement in his sleep and daytime alertness. He is having no issues with pressure, but is having some mask leaking that is requiring pulling tight.  Unclear if he is not replacing the seals soon enough, or if the mask fit is the issue.  I have asked him to discuss this with his dme, and have encouraged him to work on weight loss.

## 2010-11-15 ENCOUNTER — Telehealth: Payer: Self-pay | Admitting: Internal Medicine

## 2010-11-15 NOTE — Telephone Encounter (Signed)
Dr. Sherene Sires, are you okay with refilling ritalin for him? I do not see where you have given him this before. Please advise, thanks!

## 2010-11-15 NOTE — Telephone Encounter (Signed)
Strongly prefer this come from a psychologist - if doesn't have one we can give him enough refills until next ov with me or his psych and regroup then

## 2010-11-16 MED ORDER — METHYLPHENIDATE HCL 20 MG PO TBCR: 20.0000 mg | EXTENDED_RELEASE_TABLET | Freq: Two times a day (BID) | ORAL | Status: DC | PRN

## 2010-11-16 NOTE — Telephone Encounter (Signed)
That will be fine, thanks.

## 2010-11-16 NOTE — Telephone Encounter (Signed)
Pt aware rx is ready for pick up adn will pick up on Tuesday. Rx is put upfront

## 2010-11-16 NOTE — Telephone Encounter (Signed)
Called and spoke with pt. Pt states he has spoke with Dr. Sherene Sires about this in the past.  He states his psychiatrist retired and had asked MW to refill the Ritalin.  Pt was last seen by [facility] Nov 2011 for a cpx.  He has no pending appts. MW, please advise.  Do you want to refill until his yearly cpx with you in Nov?

## 2010-11-16 NOTE — Telephone Encounter (Signed)
Printed rx and put on MW's dictation desk for him to sign.

## 2011-04-16 ENCOUNTER — Encounter: Payer: Self-pay | Admitting: Gastroenterology

## 2011-05-15 ENCOUNTER — Telehealth: Payer: Self-pay | Admitting: Pulmonary Disease

## 2011-05-15 ENCOUNTER — Telehealth: Payer: Self-pay | Admitting: Internal Medicine

## 2011-05-15 NOTE — Telephone Encounter (Signed)
Ok to refill and schedule cpx next available

## 2011-05-15 NOTE — Telephone Encounter (Signed)
Reviewed pt's chart. Pt hasn't seen MW since 01/2010!!! And has no pending appts.  First avail appt with MW isn't until 05/29/11.  MW, please advise if ok to refill Ritalin rx or not or does pt need appt with you first?

## 2011-05-15 NOTE — Telephone Encounter (Signed)
Error. Will start new msg. Richard [name]

## 2011-05-16 MED ORDER — METHYLPHENIDATE HCL ER 20 MG PO TBCR: 20.0000 mg | EXTENDED_RELEASE_TABLET | Freq: Two times a day (BID) | ORAL | Status: DC | PRN

## 2011-05-16 NOTE — Telephone Encounter (Signed)
Pt informed that rx refill for Ritalin was approved by Dr Sherene Sires.  Pt to schedule cps with Dr Sherene Sires.  Rx mailed to pt's home.

## 2011-05-21 ENCOUNTER — Telehealth: Payer: Self-pay | Admitting: Internal Medicine

## 2011-05-21 NOTE — Telephone Encounter (Signed)
Spoke with patient-aware that MW is out of office until Thursday morning; pt is fine with waiting for MW to return and respond to this message.

## 2011-05-23 MED ORDER — METHYLPHENIDATE HCL ER 20 MG PO TBCR: 20.0000 mg | EXTENDED_RELEASE_TABLET | ORAL | Status: DC

## 2011-05-23 NOTE — Telephone Encounter (Signed)
Per MW- okay to refill the ritilin sr 20 mg. Rx mailed to pt per his request and he is aware. Nothing further needed.

## 2011-05-23 NOTE — Telephone Encounter (Signed)
This is not something we normally prescribe so I don't know how to help him - if he can find a source for the med he wants I'll be happy to write the rx

## 2011-06-13 ENCOUNTER — Encounter: Payer: Self-pay | Admitting: Internal Medicine

## 2011-06-13 ENCOUNTER — Other Ambulatory Visit (INDEPENDENT_AMBULATORY_CARE_PROVIDER_SITE_OTHER): Payer: BC Managed Care – PPO

## 2011-06-13 ENCOUNTER — Ambulatory Visit (INDEPENDENT_AMBULATORY_CARE_PROVIDER_SITE_OTHER): Payer: BC Managed Care – PPO | Admitting: Internal Medicine

## 2011-06-13 ENCOUNTER — Ambulatory Visit (INDEPENDENT_AMBULATORY_CARE_PROVIDER_SITE_OTHER)
Admission: RE | Admit: 2011-06-13 | Discharge: 2011-06-13 | Disposition: A | Discharge status: Home / Self Care | Payer: BC Managed Care – PPO | Source: Ambulatory Visit | Attending: Internal Medicine | Admitting: Internal Medicine

## 2011-06-13 DIAGNOSIS — G4733 Obstructive sleep apnea (adult) (pediatric): Secondary | ICD-10-CM

## 2011-06-13 DIAGNOSIS — Z0000 Encounter for general adult medical examination without abnormal findings: Secondary | ICD-10-CM

## 2011-06-13 DIAGNOSIS — F988 Other specified behavioral and emotional disorders with onset usually occurring in childhood and adolescence: Secondary | ICD-10-CM

## 2011-06-13 DIAGNOSIS — E785 Hyperlipidemia, unspecified: Secondary | ICD-10-CM

## 2011-06-13 LAB — URINALYSIS
Leukocytes, UA: NEGATIVE
Nitrite: NEGATIVE
Specific Gravity, Urine: 1.02 (ref 1.000–1.030)
Urobilinogen, UA: 0.2 (ref 0.0–1.0)

## 2011-06-13 LAB — CBC WITH DIFFERENTIAL/PLATELET
Basophils Relative: 0.4 % (ref 0.0–3.0)
Eosinophils Relative: 3.4 % (ref 0.0–5.0)
HCT: 45.6 % (ref 39.0–52.0)
Lymphs Abs: 1.3 10*3/uL (ref 0.7–4.0)
Monocytes Relative: 9.3 % (ref 3.0–12.0)
Neutrophils Relative %: 67.8 % (ref 43.0–77.0)
Platelets: 200 10*3/uL (ref 150.0–400.0)
RBC: 4.47 Mil/uL (ref 4.22–5.81)
WBC: 6.7 10*3/uL (ref 4.5–10.5)

## 2011-06-13 LAB — HEPATIC FUNCTION PANEL
ALT: 62 U/L — ABNORMAL HIGH (ref 0–53)
AST: 45 U/L — ABNORMAL HIGH (ref 0–37)
Bilirubin, Direct: 0.1 mg/dL (ref 0.0–0.3)
Total Bilirubin: 0.4 mg/dL (ref 0.3–1.2)

## 2011-06-13 LAB — LDL CHOLESTEROL, DIRECT: Direct LDL: 139.9 mg/dL

## 2011-06-13 LAB — BASIC METABOLIC PANEL
BUN: 12 mg/dL (ref 6–23)
GFR: 124.36 mL/min (ref >60.00)
Potassium: 4.3 mEq/L (ref 3.5–5.1)

## 2011-06-13 LAB — LIPID PANEL
Total CHOL/HDL Ratio: 4
VLDL: 20.6 mg/dL (ref 0.0–40.0)

## 2011-06-13 NOTE — Patient Instructions (Addendum)
Please remember to go to the lab and x-ray department downstairs for your tests - we will call you with the results when they are available.    You have declined psa screening and I am in agreement.  You have seborrheic keratoses on your trunk they are cosmetic but can be easily removed if they bother you - call for referral  See Tammy NP for ear wax if the over the counter ear wax kits aren't helping  Weight control is simply a matter of calorie balance which needs to be tilted in your favor by eating less and exercising more.  To get the most out of exercise, you need to be continuously aware that you are short of breath, but never out of breath, for 30 minutes daily. As you improve, it will actually be easier for you to do the same amount of exercise  in  30 minutes so always push to the level where you are short of breath.  If this does not result in gradual weight reduction then I strongly recommend you see a nutritionist with a food diary x 2 weeks so that we can work out a negative calorie balance which is universally effective in steady weight loss programs.  Think of your calorie balance like you do your bank account where in this case you want the balance to go down so you must take in less calories than you burn up.  It's just that simple:  Hard to do, but easy to understand.  Good luck!   Follow up yearly

## 2011-06-13 NOTE — Progress Notes (Signed)
  Subjective:    Patient ID: Richard [name], male    DOB: Mar 31, 1955   MRN: [id number]  HPI  22 yowm with ADHD moderate obesity and h/o mild eczema.   February 06, 2010 cpx cc excessive sleepiness, no cp or sob. rec sleep eval > pos severe osa, see eval by [facility] for autoset cpap  06/13/11 CPX no new complaints, sleeping better.   Sleeping ok without nocturnal  or early am exacerbation  of respiratoryAlso denies any obvious fluctuation of symptoms with weather or environmental changes or other aggravating or alleviating factors except as outlined above     Past Medical History:  ALLERGIC RHINITIS (ICD-477.9)  DERMATITIS (ICD-692.9)  OBESITY  - Target wt = 208 for BMI < 30  ? OSA  - PSS  2011 Severe OSA > autoset cpap per [facility] Maintenance...................................[name] KitchenWert  - Td February 06, 2010  - Colonoscopy rec February 06, 2010 and again 06/13/11 - CPX June 13 2011 declined psa study   Family History:  cancer: father (lung)   Social History:  Patient states former smoker. Quit May 2006.  Pt works as an Special educational needs teacher.  ETOH daily  Married     Review of Systems  Constitutional: Negative for fever, chills, diaphoresis, activity change, appetite change, fatigue and unexpected weight change.  HENT: Negative for hearing loss, ear pain, nosebleeds, congestion, sore throat, facial swelling, rhinorrhea, sneezing, mouth sores, trouble swallowing, neck pain, neck stiffness, dental problem, voice change, postnasal drip, sinus pressure, tinnitus and ear discharge.   Eyes: Positive for visual disturbance. Negative for photophobia, discharge and itching.  Respiratory: Negative for apnea, cough, choking, chest tightness, shortness of breath, wheezing and stridor.   Cardiovascular: Negative for chest pain, palpitations and leg swelling.  Gastrointestinal: Negative for nausea, vomiting, abdominal pain, constipation, blood in stool and abdominal distention.    Genitourinary: Negative for dysuria, urgency, frequency, hematuria, flank pain, decreased urine volume and difficulty urinating.  Musculoskeletal: Negative for myalgias, back pain, joint swelling, arthralgias and gait problem.  Skin: Negative for color change, pallor and rash.  Neurological: Negative for dizziness, tremors, seizures, syncope, speech difficulty, weakness, light-headedness, numbness and headaches.  Hematological: Negative for adenopathy. Does not bruise/bleed easily.  Psychiatric/Behavioral: Positive for sleep disturbance and agitation. Negative for confusion. The patient is nervous/anxious.        Objective:   Physical Exam   Wt Readings from Last 3 Encounters:  06/13/11 271 lb (122.925 kg)  09/26/10 273 lb (123.832 kg)  03/30/10 274 lb 2.1 oz (124.345 kg)     Ambulatory obese wm in no acute distress.  wt 265 > 268 February 06, 2010 > 271 06/13/11 HEENT: nl dentition, turbinates, and orophanx. Nl external ear canals without cough reflex  Neck without JVD/Nodes/TM  Lungs clear to A and P bilaterally without cough on insp or exp maneuvers  RRR no s3 or murmur or increase in P2  Abd soft and benign with nl excursion in the supine position. No bruits or organomegaly  Ext warm without calf tenderness, cyanosis clubbing or edema  Skin warm and dry with seb keratoses  Neuro nl sensorium, no nl gait, no motor deficits  MS no joint limitations and nl gait GU Testes down bilaterally, no nodules or IH Rectal stool g neg, no nodules, min bph changes   CXR  06/13/2011 :  Stable chest x-ray with no evidence of acute cardiac or pulmonary process.      Assessment & Plan:

## 2011-06-15 DIAGNOSIS — E785 Hyperlipidemia, unspecified: Secondary | ICD-10-CM | POA: Insufficient documentation

## 2011-06-15 NOTE — Assessment & Plan Note (Addendum)
-   Target wt = 208 for BMI < 30  Complicated by borderline resting hyperglycemia  Discussed with pt: Weight control is simply a matter of calorie balance which needs to be tilted in your favor by eating less and exercising more.  To get the most out of exercise, you need to be continuously aware that you are short of breath, but never out of breath, for 30 minutes daily. As you improve, it will actually be easier for you to do the same amount of exercise  in  30 minutes so always push to the level where you are short of breath.  If this does not result in gradual weight reduction then I strongly recommend you see a nutritionist with a food diary x 2 weeks so that we can work out a negative calorie balance which is universally effective in steady weight loss programs.  Think of your calorie balance like you do your bank account where in this case you want the balance to go down so you must take in less calories than you burn up.  It's just that simple:  Hard to do, but easy to understand.  Good luck!

## 2011-06-15 NOTE — Assessment & Plan Note (Signed)
Adequate control on present rx, reviewed limited options, strongly prefer he be under the care of a psychologist but he declined

## 2011-06-15 NOTE — Assessment & Plan Note (Addendum)
NPSG 2011:  AHI 94/hr >  cpap per Dr Shelle Iron  Adequate control on present rx, reviewed absence of hypersomnolence

## 2011-06-15 NOTE — Assessment & Plan Note (Signed)
Ideally LDL should be < 130 and easily reached by diet and ex, reviewed

## 2011-06-17 NOTE — Progress Notes (Signed)
Quick Note:  Spoke with pt and notified of results per Dr. Wert. Pt verbalized understanding and denied any questions.  ______ 

## 2011-09-17 ENCOUNTER — Telehealth: Payer: Self-pay | Admitting: Internal Medicine

## 2011-09-17 MED ORDER — METHYLPHENIDATE HCL ER 20 MG PO TBCR: 20.0000 mg | EXTENDED_RELEASE_TABLET | ORAL | Status: DC

## 2011-09-17 NOTE — Telephone Encounter (Signed)
I have printed off rx and was placed in MW look at for him to sign. Please advise Dr. Sherene Sires, thanks

## 2011-09-17 NOTE — Telephone Encounter (Signed)
Rx was signed. Spoke with pt and verified his address. Rx mailed.

## 2011-09-23 ENCOUNTER — Telehealth: Payer: Self-pay | Admitting: Internal Medicine

## 2011-09-23 NOTE — Telephone Encounter (Signed)
Returning call can be reached at [phone].Richard [name] ° °

## 2011-09-23 NOTE — Telephone Encounter (Signed)
Called spoke with patient who stated that the last time his rx had "ER" on it, he couldn't find a pharmacy to fill it and is concerned this will be the issue.  Per pt's chart, methylphenidate ER was filled on 2.27.13.  Pt called back on 3.5.13 and stated that he was having difficulty finding a pharmacy that would fill the ER and was subsequently given the SR that was just filled.  Pt okay with this and verbalized his understanding to call back if he runs into any issues.  Will sign off.

## 2011-09-23 NOTE — Telephone Encounter (Signed)
LMOM TCB x1.  Per pt's med list, he is taking methylphenidate (RITALIN SR) 20mg  ER tablet, 1 tab by mouth every morning.  So the SR is what was filled; this is also what was last filled on 3.7.13.  Not sure what the actual script says, but it looks like the correct medication was filled.

## 2011-10-01 ENCOUNTER — Ambulatory Visit: Payer: BC Managed Care – PPO | Admitting: Pulmonary Disease

## 2011-10-04 ENCOUNTER — Ambulatory Visit: Payer: BC Managed Care – PPO | Admitting: Pulmonary Disease

## 2011-10-14 ENCOUNTER — Encounter: Payer: Self-pay | Admitting: Gastroenterology

## 2011-10-25 ENCOUNTER — Ambulatory Visit (AMBULATORY_SURGERY_CENTER): Payer: BC Managed Care – PPO | Admitting: *Deleted

## 2011-10-25 VITALS — Ht 71.0 in | Wt 265.0 lb

## 2011-10-25 DIAGNOSIS — Z1211 Encounter for screening for malignant neoplasm of colon: Secondary | ICD-10-CM

## 2011-10-25 MED ORDER — SUPREP BOWEL PREP KIT 17.5-3.13-1.6 GM/177ML PO SOLN: ORAL | Status: DC

## 2011-10-28 ENCOUNTER — Encounter: Payer: Self-pay | Admitting: Gastroenterology

## 2011-11-05 ENCOUNTER — Encounter: Payer: Self-pay | Admitting: Pulmonary Disease

## 2011-11-05 ENCOUNTER — Ambulatory Visit (INDEPENDENT_AMBULATORY_CARE_PROVIDER_SITE_OTHER): Payer: BC Managed Care – PPO | Admitting: Pulmonary Disease

## 2011-11-05 VITALS — BP 120/80 | HR 78 | Temp 98.9°F | Ht 71.0 in | Wt 265.0 lb

## 2011-11-05 DIAGNOSIS — G4733 Obstructive sleep apnea (adult) (pediatric): Secondary | ICD-10-CM

## 2011-11-05 NOTE — Assessment & Plan Note (Signed)
The patient is doing very well on his current CPAP, and has no issues with the device or mask.  I have asked him to keep up with supplies and mask changes, and to continue working on weight loss.

## 2011-11-05 NOTE — Patient Instructions (Addendum)
Continue with cpap, keep up with supplies Work on weight loss followup with me in one year. 

## 2011-11-05 NOTE — Progress Notes (Signed)
  Subjective:    Patient ID: Richard [name], male    DOB: [birth year], 56 y.o.   MRN: [id number]  HPI The patient comes in today for followup of his known sleep apnea.  He has been wearing CPAP compliantly, and is having no issues with his mask fit or pressure.  He feels that he sleeps well, and is satisfied with his daytime alertness.  He has lost 8 pounds since last visit.   Review of Systems  Constitutional: Negative for fever and unexpected weight change.  HENT: Positive for sneezing and postnasal drip. Negative for ear pain, nosebleeds, congestion, sore throat, rhinorrhea, trouble swallowing, dental problem and sinus pressure.   Eyes: Negative for redness and itching.  Respiratory: Negative for cough, chest tightness, shortness of breath and wheezing.   Cardiovascular: Negative for palpitations and leg swelling.  Gastrointestinal: Negative for nausea and vomiting.  Genitourinary: Negative for dysuria.  Musculoskeletal: Negative for joint swelling.  Skin: Negative for rash.  Neurological: Negative for headaches.  Hematological: Does not bruise/bleed easily.  Psychiatric/Behavioral: Negative for dysphoric mood. The patient is not nervous/anxious.   All other systems reviewed and are negative.       Objective:   Physical Exam Overweight male in no acute distress No skin breakdown or pressure necrosis from the CPAP mask Lower extremities with no significant edema, no cyanosis Alert and oriented, moves all 4 extremities.       Assessment & Plan:

## 2011-11-12 ENCOUNTER — Telehealth: Payer: Self-pay | Admitting: *Deleted

## 2011-11-12 ENCOUNTER — Encounter: Payer: Self-pay | Admitting: Gastroenterology

## 2011-11-12 ENCOUNTER — Ambulatory Visit (AMBULATORY_SURGERY_CENTER): Payer: BC Managed Care – PPO | Admitting: Gastroenterology

## 2011-11-12 VITALS — BP 141/60 | HR 83 | Temp 97.7°F | Resp 18 | Ht 71.0 in | Wt 265.0 lb

## 2011-11-12 DIAGNOSIS — K5730 Diverticulosis of large intestine without perforation or abscess without bleeding: Secondary | ICD-10-CM

## 2011-11-12 DIAGNOSIS — Z1211 Encounter for screening for malignant neoplasm of colon: Secondary | ICD-10-CM

## 2011-11-12 DIAGNOSIS — D126 Benign neoplasm of colon, unspecified: Secondary | ICD-10-CM

## 2011-11-12 DIAGNOSIS — K635 Polyp of colon: Secondary | ICD-10-CM

## 2011-11-12 MED ORDER — SODIUM CHLORIDE 0.9 % IV SOLN: 500.0000 mL | INTRAVENOUS | Status: DC

## 2011-11-12 NOTE — Op Note (Signed)
[facility] [address] [place], [address]   COLONOSCOPY PROCEDURE REPORT  PATIENT: [name], [name]  MR#: [id number] BIRTHDATE: Jun 08, 1955 , 56  yrs. old GENDER: Male ENDOSCOPIST: Louis Meckel, MD REFERRED BY: PROCEDURE DATE:  11/12/2011 PROCEDURE:   Colonoscopy with snare polypectomy ASA CLASS:   Class II INDICATIONS:follow up for previously diagnosed colonic polyps. MEDICATIONS: MAC sedation, administered by CRNA and Propofol (Diprivan) 160 mg IV  DESCRIPTION OF PROCEDURE:   After the risks benefits and alternatives of the procedure were thoroughly explained, informed consent was obtained.  A digital rectal exam revealed no abnormalities of the rectum.   The LB CF-[id number]  endoscope was introduced through the anus and advanced to the cecum, which was identified by both the appendix and ileocecal valve. No adverse events experienced.   The quality of the prep was good, using MoviPrep  The instrument was then slowly withdrawn as the colon was fully examined.      COLON FINDINGS: A sessile polyp measuring 3 mm in size was found in the ascending colon.  A polypectomy was performed with a cold snare.  The resection was complete and the polyp tissue was completely retrieved.   Mild diverticulosis was noted in the descending colon and transverse colon.   The colon mucosa was otherwise normal.  Retroflexed views revealed no abnormalities. The time to cecum=1 minutes 57 seconds.  Withdrawal time=9 minutes 40 seconds.  The scope was withdrawn and the procedure completed. COMPLICATIONS: There were no complications.  ENDOSCOPIC IMPRESSION: 1.   Sessile polyp measuring 3-4 mm in size was found in the ascending colon; polypectomy was performed with a cold snare 2.   Mild diverticulosis was noted in the descending colon and transverse colon 3.   The colon  was otherwise normal  RECOMMENDATIONS: Colonoscopy in 5 years   eSigned:  Louis Meckel,  MD 11/12/2011 3:20 PM   cc: Nyoka Cowden, MD   PATIENT NAME:  Richard [name], Richard [name] MR#: [id number]

## 2011-11-12 NOTE — Telephone Encounter (Signed)
Called patient back and he had already started the second half of his prep.  Wasn't suppose to start until 9:30 am.  He states the rash has gotten better so I advised him to finish the prep he has started and call if the rash gets worse again.  I also advised him to use OTC anti itching cream for rash to see if it helps.  He agreed to do so.

## 2011-11-12 NOTE — Patient Instructions (Addendum)
YOU HAD AN ENDOSCOPIC PROCEDURE TODAY AT THE [facility]: Refer to the procedure report that was given to you for any specific questions about what was found during the examination.  If the procedure report does not answer your questions, please call your gastroenterologist to clarify.  If you requested that your care partner not be given the details of your procedure findings, then the procedure report has been included in a sealed envelope for you to review at your convenience later.  YOU SHOULD EXPECT: Some feelings of bloating in the abdomen. Passage of more gas than usual.  Walking can help get rid of the air that was put into your GI tract during the procedure and reduce the bloating. If you had a lower endoscopy (such as a colonoscopy or flexible sigmoidoscopy) you may notice spotting of blood in your stool or on the toilet paper. If you underwent a bowel prep for your procedure, then you may not have a normal bowel movement for a few days.  DIET: Your first meal following the procedure should be a light meal and then it is ok to progress to your normal diet.  A half-sandwich or bowl of soup is an example of a good first meal.  Heavy or fried foods are harder to digest and may make you feel nauseous or bloated.  Likewise meals heavy in dairy and vegetables can cause extra gas to form and this can also increase the bloating.  Drink plenty of fluids but you should avoid alcoholic beverages for 24 hours.  ACTIVITY: Your care partner should take you home directly after the procedure.  You should plan to take it easy, moving slowly for the rest of the day.  You can resume normal activity the day after the procedure however you should NOT DRIVE or use heavy machinery for 24 hours (because of the sedation medicines used during the test).    SYMPTOMS TO REPORT IMMEDIATELY: A gastroenterologist can be reached at any hour.  During normal business hours, 8:30 AM to 5:00 PM Monday through Friday,  call [phone].  After hours and on weekends, please call the GI answering service at [phone] who will take a message and have the physician on call contact you.   Following lower endoscopy (colonoscopy or flexible sigmoidoscopy):  Excessive amounts of blood in the stool  Significant tenderness or worsening of abdominal pains  Swelling of the abdomen that is new, acute  Fever of 100F or higher  Following upper endoscopy (EGD)  Vomiting of blood or coffee ground material  New chest pain or pain under the shoulder blades  Painful or persistently difficult swallowing  New shortness of breath  Fever of 100F or higher  Black, tarry-looking stools  FOLLOW UP: If any biopsies were taken you will be contacted by phone or by letter within the next 1-3 weeks.  Call your gastroenterologist if you have not heard about the biopsies in 3 weeks.  Our staff will call the home number listed on your records the next business day following your procedure to check on you and address any questions or concerns that you may have at that time regarding the information given to you following your procedure. This is a courtesy call and so if there is no answer at the home number and we have not heard from you through the emergency physician on call, we will assume that you have returned to your regular daily activities without incident.  SIGNATURES/CONFIDENTIALITY: You and/or your care   partner have signed paperwork which will be entered into your electronic medical record.  These signatures attest to the fact that that the information above on your After Visit Summary has been reviewed and is understood.  Full responsibility of the confidentiality of this discharge information lies with you and/or your care-partner.  

## 2011-11-12 NOTE — Telephone Encounter (Signed)
Patient's wife called and stated the patient took his first half of prep yesterday as ordered and woke up this a.m. With a rash on both forearms.  His wife states that it woke him up itching and has small bumps on the forearms.  She was concerned about him taking the second half of the prep as she says it may be a reaction to the prep.  What do you advise?

## 2011-11-12 NOTE — Progress Notes (Signed)
Patient did not experience any of the following events: a burn prior to discharge; a fall within the facility; wrong site/side/patient/procedure/implant event; or a hospital transfer or hospital admission upon discharge from the facility. (G8907) Patient did not have preoperative order for IV antibiotic SSI prophylaxis. (G8918)  

## 2011-11-13 ENCOUNTER — Telehealth: Payer: Self-pay | Admitting: *Deleted

## 2011-11-13 NOTE — Telephone Encounter (Signed)
  Follow up Call-  Call back number 11/12/2011  Post procedure Call Back phone  # [phone]  Permission to leave phone message Yes     Patient questions:  Do you have a fever, pain , or abdominal swelling? no Pain Score  0 *  Have you tolerated food without any problems? yes  Have you been able to return to your normal activities? yes  Do you have any questions about your discharge instructions: Diet   no Medications  no Follow up visit  no  Do you have questions or concerns about your Care? no  Actions: * If pain score is 4 or above: No action needed, pain <4.

## 2011-11-20 ENCOUNTER — Encounter: Payer: Self-pay | Admitting: Gastroenterology

## 2012-01-31 ENCOUNTER — Telehealth: Payer: Self-pay | Admitting: Internal Medicine

## 2012-01-31 MED ORDER — METHYLPHENIDATE HCL ER (CD) 20 MG PO CPCR: 20.0000 mg | ORAL_CAPSULE | Freq: Every day | ORAL | Status: DC | PRN

## 2012-01-31 NOTE — Telephone Encounter (Signed)
I spoke with pt and he stated he needed his RX for methylphenidate mailed to his home address. I have confirmed address. rx was placed in MW look at.

## 2012-02-05 NOTE — Telephone Encounter (Signed)
Done

## 2012-04-14 ENCOUNTER — Telehealth: Payer: Self-pay | Admitting: Internal Medicine

## 2012-04-14 MED ORDER — TRIAMCINOLONE ACETONIDE 0.1 % EX CREA: TOPICAL_CREAM | Freq: Two times a day (BID) | CUTANEOUS | Status: DC | PRN

## 2012-04-14 MED ORDER — METHYLPHENIDATE HCL ER (CD) 20 MG PO CPCR: 20.0000 mg | ORAL_CAPSULE | Freq: Every day | ORAL | Status: DC | PRN

## 2012-04-14 NOTE — Telephone Encounter (Signed)
I spoke with spouse and confirmed rx's and directions. I also confirmed mailing address. She stated it was fine to send the kenalog cream to the pharmacy. RX for ritalin has been printed out and placed in MW look out for signature.

## 2012-04-14 NOTE — Telephone Encounter (Signed)
Last ov 3.28.13 w/ MW for physical > follow up yearly.  LMOM TCB x1 > would like to verify ritalin strength and directions.  Can also send the kenalog cream electronically to verified pharmacy.  MW not in office today but will return tomorrow.

## 2012-04-14 NOTE — Telephone Encounter (Signed)
Returning call can be reached at [phone].Richard [name]

## 2012-04-16 MED ORDER — METHYLPHENIDATE HCL ER (CD) 20 MG PO CPCR: 20.0000 mg | ORAL_CAPSULE | Freq: Every day | ORAL | Status: DC | PRN

## 2012-04-16 NOTE — Telephone Encounter (Signed)
Dr. Sherene Sires, have you had a chance to sign this prescription? Did this get placed in the mail or did you give this to someone to handle?

## 2012-04-16 NOTE — Telephone Encounter (Signed)
Don't see it in my look at stack

## 2012-04-16 NOTE — Telephone Encounter (Signed)
RX reprinted, MW signed and this was placed in the mail.

## 2012-04-17 ENCOUNTER — Telehealth: Payer: Self-pay | Admitting: Internal Medicine

## 2012-04-17 MED ORDER — METHYLPHENIDATE HCL ER (CD) 20 MG PO CPCR: 20.0000 mg | ORAL_CAPSULE | Freq: Every day | ORAL | Status: DC | PRN

## 2012-04-17 NOTE — Telephone Encounter (Signed)
Called Dr. Sherene Sires.  Spoke with him regarding blow situation.  Per Dr. Sherene Sires, ok to give pt this rx today and have another provider in office sign rx.  If provider has questions, they can call Dr. Sherene Sires.  Spoke with TP.  She will sign rx for pt.  Rx printed and signed by TP and placed at front for pick up.  Called, spoke with pt.  Informed him rx has been signed by TP and ok to come pick up.  Pt states it may take him close to 1 hour to get here now with the "rush hr traffic."  Pt was advised to call office BEFORE 5:30 pm if he will not be here by 5:30 and advise how far out he is.  He verbalized understanding of this and states he will leave home now.

## 2012-04-17 NOTE — Telephone Encounter (Signed)
Called, spoke with pt.  (Pls see phone note from 04/14/12 for additional information.)  Ritalin SR 20 mg rx was placed in the mail yesterday.  We verified this was ok with pt's spouse.  Pt calling today stating this rx did not come in the mail today, and his insurance will expire in "7 hours and 4 mins."  Pt states he knew his insurance would expire when he called in on Tuesday and "thought I made it clear that I needed it."  I see it mentioned in phone msg from 04/17/12 that pt requested this to be done prior to 1/31 but no mention of insurance expiring.  Pt is requesting to pick up ritalin sr 20 mg rx TODAY.  Dr. Sherene Sires, if we can get another provider in office to sign rx, are you ok with [place] giving pt another rx considering there is one in the mail?  Pls advise.  Thank you.    **Note:  Pt states it will take him 30-45 minutes to get to office once called if this is approved.

## 2012-08-12 ENCOUNTER — Encounter: Payer: Self-pay | Admitting: Nurse Practitioner

## 2012-08-12 ENCOUNTER — Ambulatory Visit (INDEPENDENT_AMBULATORY_CARE_PROVIDER_SITE_OTHER): Payer: BC Managed Care – PPO | Admitting: Nurse Practitioner

## 2012-08-12 VITALS — BP 130/70 | HR 113 | Temp 98.4°F | Ht 70.18 in | Wt 265.5 lb

## 2012-08-12 DIAGNOSIS — S80869A Insect bite (nonvenomous), unspecified lower leg, initial encounter: Secondary | ICD-10-CM

## 2012-08-12 DIAGNOSIS — S90569A Insect bite (nonvenomous), unspecified ankle, initial encounter: Secondary | ICD-10-CM

## 2012-08-12 MED ORDER — MUPIROCIN 2 % EX OINT: TOPICAL_OINTMENT | Freq: Three times a day (TID) | CUTANEOUS | Status: DC

## 2012-08-12 NOTE — Patient Instructions (Signed)
Wash areas twice daily with mild soap & water. Pat dry. Apply bactroban cream & cover. Watch for signs of infection like fever, increase pain, enlarging redness. Also, you must monitor for symptoms of tick fever: Headache, joint pain, muscle aches, fatigue, fever within 21 days from exposure. Feel better!

## 2012-08-12 NOTE — Progress Notes (Signed)
Subjective:     Richard [name] is a 57 y.o. male who presents for evaluation of a rash involving the leg. Rash started a few days ago. Lesions are pink, purulent and weeping, and raised in texture. Rash has changed over time. Rash is pruritic. Associated symptoms: none. Patient denies: abdominal pain, arthralgia, decrease in appetite, fever, headache, irritability and myalgia. Patient has not had contacts with similar rash. Patient has had new exposures (pulled tick off of his L leg 3 days ago. Also noted that he slept in unfinished basement 4 nights ago).  The following portions of the patient's history were reviewed and updated as appropriate: allergies, current medications, past medical history and problem list.  Review of Systems A comprehensive review of systems was negative except for: Integument/breast: positive for rash    Objective:    BP 130/70  Pulse 113  Temp(Src) 98.4 F (36.9 C) (Oral)  Ht 5' 10.18" (1.783 m)  Wt 265 lb 8 oz (120.43 kg)  BMI 37.88 kg/m2  SpO2 96% General:  alert, cooperative, appears stated age and no distress  Skin:  pustules noted on 3, 4mm lesions R thigh; 1, 6 mm lesion L lower leg     Assessment:    bites, insect    Plan:    Medications: bactroban. Written patient instruction given. [name] Kitchen

## 2012-11-04 ENCOUNTER — Ambulatory Visit: Payer: BC Managed Care – PPO | Admitting: Pulmonary Disease

## 2012-12-07 ENCOUNTER — Ambulatory Visit (INDEPENDENT_AMBULATORY_CARE_PROVIDER_SITE_OTHER): Payer: BC Managed Care – PPO | Admitting: Pulmonary Disease

## 2012-12-07 ENCOUNTER — Encounter: Payer: Self-pay | Admitting: Pulmonary Disease

## 2012-12-07 VITALS — BP 132/80 | HR 83 | Temp 98.9°F | Ht 71.0 in | Wt 271.4 lb

## 2012-12-07 DIAGNOSIS — G4733 Obstructive sleep apnea (adult) (pediatric): Secondary | ICD-10-CM

## 2012-12-07 NOTE — Patient Instructions (Addendum)
continue with cpap, and keep up with mask cushions and supplies. Work on weight loss followup with me in one year.

## 2012-12-07 NOTE — Progress Notes (Signed)
  Subjective:    Patient ID: Richard [name], male    DOB: 04/19/1955, 57 y.o.   MRN: [id number]  HPI The patient comes in today for followup of his obstructive sleep apnea.  He is wearing CPAP compliantly, and is having no issues with mask fit or pressure.  He feels that he sleeps well with the device, and is satisfied with his daytime alertness.  He has gained some weight since the last visit.   Review of Systems  Constitutional: Negative for fever and unexpected weight change.  HENT: Negative for ear pain, nosebleeds, congestion, sore throat, rhinorrhea, sneezing, trouble swallowing, dental problem, postnasal drip and sinus pressure.        Allergies//sinus  Eyes: Negative for redness and itching.  Respiratory: Negative for cough, chest tightness, shortness of breath and wheezing.   Cardiovascular: Negative for palpitations and leg swelling.  Gastrointestinal: Negative for nausea and vomiting.  Genitourinary: Negative for dysuria.  Musculoskeletal: Negative for joint swelling.  Skin: Negative for rash.  Neurological: Negative for headaches.  Hematological: Does not bruise/bleed easily.  Psychiatric/Behavioral: Negative for dysphoric mood. The patient is not nervous/anxious.        Objective:   Physical Exam Obese male in no acute distress Nose without purulence or discharge noted No skin breakdown or pressure necrosis from the CPAP mask Neck without lymphadenopathy or thyromegaly Lower extremities without edema, cyanosis Alert and oriented, moves all 4 extremities.       Assessment & Plan:

## 2012-12-07 NOTE — Assessment & Plan Note (Signed)
The patient is doing very well on CPAP, and feels that it is totally controlling his symptoms.  I have asked him to keep up with his mask changes and supplies, and to work aggressively on weight loss.  I will see him back in one year if he is doing well.

## 2013-02-26 ENCOUNTER — Encounter: Payer: Self-pay | Admitting: Family Medicine

## 2013-02-26 ENCOUNTER — Ambulatory Visit (INDEPENDENT_AMBULATORY_CARE_PROVIDER_SITE_OTHER): Payer: 59 | Admitting: Family Medicine

## 2013-02-26 VITALS — BP 148/86 | HR 105 | Temp 98.8°F | Ht 70.18 in | Wt 271.0 lb

## 2013-02-26 DIAGNOSIS — Z0389 Encounter for observation for other suspected diseases and conditions ruled out: Secondary | ICD-10-CM

## 2013-02-26 DIAGNOSIS — Z0000 Encounter for general adult medical examination without abnormal findings: Secondary | ICD-10-CM

## 2013-02-26 MED ORDER — METHYLPHENIDATE HCL ER (CD) 20 MG PO CPCR: 20.0000 mg | ORAL_CAPSULE | Freq: Every day | ORAL | Status: DC | PRN

## 2013-02-26 NOTE — Progress Notes (Signed)
Pre-visit discussion using our clinic review tool. No additional management support is needed unless otherwise documented below in the visit note.       Office Note 03/02/2013  CC:  Chief Complaint  Patient presents with  . Establish Care    HPI:  Richard [name] is a 57 y.o. White male who is here today for CPE. He is new to our practice but has been established with [facility]. No acute complaints.  Wants insurance CPE verification form signed to help with his rates. Gets regular dental visits as well as nearly annual vision exams. No hearing complaints.  Says he tolerates his ADD for the most part, but on occasion needs to take a metadate CD, need RF of this med today.  Denies any adverse side effects from this med.  Says pulm MD Dr. Sherene Sires has been refilling this for him in the past since he has not had a PMD.   Past Medical History  Diagnosis Date  . Allergic rhinitis   . Dermatitis   . Obesity   . OSA (obstructive sleep apnea)     wears CPAP  . Anxiety     claustrophobic  . Colon polyps 2012 and 2013    All hyperplastic except one adenomatous w/out high grade dysplasia (recall 2023 per Dr. Arlyce Dice)    Past Surgical History  Procedure Laterality Date  . Mouth surgery    . Colonoscopy w/ polypectomy  04/2010; 10/2011    Initial screening TCS showed 6 polyps (one adenomatous).  Pt says he returned for repeat TCS 10/2011 showed one hyperplastic polyp: Recall 10 yrs per Dr. Arlyce Dice    Family History  Problem Relation Age of Onset  . Lung cancer Father   . Cancer Father     nasal  . Allergies Father   . Colon cancer Neg Hx   . Esophageal cancer Neg Hx   . Rectal cancer Neg Hx   . Stomach cancer Neg Hx     History   Social History  . Marital Status: Married    Spouse Name: N/A    Number of Children: N/A  . Years of Education: N/A   Occupational History  . Special educational needs teacher    Social History Main Topics  . Smoking status: Former Smoker -- 1.00  packs/day    Types: Cigarettes    Quit date: 07/17/2002  . Smokeless tobacco: Never Used  . Alcohol Use: 3.5 oz/week    7 drink(s) per week     Comment: wine every night with dinner  . Drug Use: No  . Sexual Activity: Not on file   Other Topics Concern  . Not on file   Social History Narrative   Married, 1 child--grown.   Occupation: Child psychotherapist"   Tobacco: 20 pack-yr hx; quit 2004.   Alcohol: nightly cocktail   Drugs: none   Exercise: "walk some when I can".   Diet: normal.          Outpatient Prescriptions Prior to Visit  Medication Sig Dispense Refill  . mupirocin ointment (BACTROBAN) 2 % Apply topically 3 (three) times daily.  22 g  0  . triamcinolone cream (KENALOG) 0.1 % Apply topically 2 (two) times daily as needed.  30 g  0  . methylphenidate (METADATE CD) 20 MG CR capsule Take 1 capsule (20 mg total) by mouth daily as needed.  30 capsule  0   No facility-administered medications prior to visit.    No Known Allergies  ROS Review of Systems  Constitutional: Negative for fever, chills, appetite change and fatigue.  HENT: Negative for congestion, dental problem, ear pain and sore throat.   Eyes: Negative for discharge, redness and visual disturbance.  Respiratory: Negative for cough, chest tightness, shortness of breath and wheezing.   Cardiovascular: Negative for chest pain, palpitations and leg swelling.  Gastrointestinal: Negative for nausea, vomiting, abdominal pain, diarrhea and blood in stool.  Genitourinary: Negative for dysuria, urgency, frequency, hematuria, flank pain and difficulty urinating.  Musculoskeletal: Negative for arthralgias, back pain, joint swelling, myalgias and neck stiffness.  Skin: Negative for pallor and rash.  Neurological: Negative for dizziness, speech difficulty, weakness and headaches.  Hematological: Negative for adenopathy. Does not bruise/bleed easily.  Psychiatric/Behavioral: Negative for confusion and sleep disturbance. The  patient is not nervous/anxious.      PE; Blood pressure 148/86, pulse 105, temperature 98.8 F (37.1 C), temperature source Oral, height 5' 10.18" (1.783 m), weight 271 lb (122.925 kg), SpO2 96.00%. Gen: Alert, well appearing, obese-appearing WM in NAD.  Patient is oriented to person, place, time, and situation. AFFECT: pleasant, lucid thought and speech. ENT: Ears: EACs clear, normal epithelium.  TMs with good light reflex and landmarks bilaterally.  Eyes: no injection, icteris, swelling, or exudate.  EOMI, PERRLA. Nose: no drainage or turbinate edema/swelling.  No injection or focal lesion.  Mouth: lips without lesion/swelling.  Oral mucosa pink and moist.  Dentition intact and without obvious caries or gingival swelling.  Oropharynx without erythema, exudate, or swelling.  Neck: supple/nontender.  No LAD, mass, or TM.  Carotid pulses 2+ bilaterally, without bruits. CV: RRR, no m/r/g.   LUNGS: CTA bilat, nonlabored resps, good aeration in all lung fields. ABD: soft, NT, rotund but ND, BS normal.  No hepatospenomegaly or mass.  No bruits. EXT: no clubbing, cyanosis, or edema.  Musculoskeletal: no joint swelling, erythema, warmth, or tenderness.  ROM of all joints intact. Skin - no sores or suspicious lesions or rashes or color changes Rectal exam: negative without mass, lesions or tenderness, PROSTATE EXAM: smooth and symmetric without nodules or tenderness.    Pertinent labs:  None today  ASSESSMENT AND PLAN:   Health maintenance examination Reviewed age and gender appropriate health maintenance issues (prudent diet, regular exercise, health risks of tobacco and excessive alcohol, use of seatbelts, fire alarms in home, use of sunscreen).  Also reviewed age and gender appropriate health screening as well as vaccine recommendations. UTD on vacc's. Signed pt's wellness paperwork for his insurance plan today. Ordered health panel labs + screening PSA for [name]. DRE today normal.   He is UTD on colon cancer screening.  An After Visit Summary was printed and given to the patient.  FOLLOW UP:  Return if symptoms worsen or fail to improve.

## 2013-02-26 NOTE — Assessment & Plan Note (Addendum)
Reviewed age and gender appropriate health maintenance issues (prudent diet, regular exercise, health risks of tobacco and excessive alcohol, use of seatbelts, fire alarms in home, use of sunscreen).  Also reviewed age and gender appropriate health screening as well as vaccine recommendations. UTD on vacc's except he declined flu vaccine today. Signed pt's wellness paperwork for his insurance plan today. Ordered health panel labs + screening PSA for Richard [name]. DRE today normal.  He is UTD on colon cancer screening.

## 2013-03-02 ENCOUNTER — Encounter: Payer: Self-pay | Admitting: Family Medicine

## 2013-07-26 ENCOUNTER — Telehealth: Payer: Self-pay | Admitting: Family Medicine

## 2013-07-26 MED ORDER — METHYLPHENIDATE HCL ER (CD) 20 MG PO CPCR: 20.0000 mg | ORAL_CAPSULE | Freq: Every day | ORAL | Status: DC | PRN

## 2013-07-26 NOTE — Telephone Encounter (Signed)
Rx for [employer].

## 2013-07-26 NOTE — Telephone Encounter (Signed)
Pt requesting refill of metadate.  Pt last OV was 02/26/13.  Last Rx was printed on 02/26/13.  Pt has no upcoming OV. Please advise rf.

## 2013-07-26 NOTE — Telephone Encounter (Signed)
Please contact patient when ready for pu

## 2013-07-27 NOTE — Telephone Encounter (Signed)
Rx at front desk.  Pt aware it is ready for pick up.

## 2013-10-04 ENCOUNTER — Encounter: Payer: Self-pay | Admitting: Family Medicine

## 2013-10-04 ENCOUNTER — Ambulatory Visit (INDEPENDENT_AMBULATORY_CARE_PROVIDER_SITE_OTHER): Payer: 59 | Admitting: Family Medicine

## 2013-10-04 VITALS — BP 148/91 | HR 98 | Temp 99.4°F | Resp 18 | Ht 70.0 in | Wt 267.0 lb

## 2013-10-04 DIAGNOSIS — M25521 Pain in right elbow: Secondary | ICD-10-CM | POA: Insufficient documentation

## 2013-10-04 DIAGNOSIS — H9190 Unspecified hearing loss, unspecified ear: Secondary | ICD-10-CM

## 2013-10-04 DIAGNOSIS — M25519 Pain in unspecified shoulder: Secondary | ICD-10-CM

## 2013-10-04 DIAGNOSIS — M25529 Pain in unspecified elbow: Secondary | ICD-10-CM

## 2013-10-04 DIAGNOSIS — F988 Other specified behavioral and emotional disorders with onset usually occurring in childhood and adolescence: Secondary | ICD-10-CM

## 2013-10-04 DIAGNOSIS — IMO0001 Reserved for inherently not codable concepts without codable children: Secondary | ICD-10-CM | POA: Insufficient documentation

## 2013-10-04 DIAGNOSIS — M25511 Pain in right shoulder: Secondary | ICD-10-CM | POA: Insufficient documentation

## 2013-10-04 DIAGNOSIS — H9193 Unspecified hearing loss, bilateral: Secondary | ICD-10-CM

## 2013-10-04 MED ORDER — METHYLPHENIDATE HCL ER (CD) 20 MG PO CPCR: 20.0000 mg | ORAL_CAPSULE | Freq: Every day | ORAL | Status: DC | PRN

## 2013-10-04 NOTE — Assessment & Plan Note (Signed)
Lateral epicondylitis, possibly a tear in extensor tendon complex right where it inserts on lat epicondyle. Continue tennis elbow strap for now. Refer to Dr. Tamala Julian in Sports Medicine for further evaluation and management.

## 2013-10-04 NOTE — Assessment & Plan Note (Signed)
Mild presbycusia on audiometry testing today. Reassured pt, no further testing needed at this time.

## 2013-10-04 NOTE — Progress Notes (Signed)
Pre visit review using our clinic review tool, if applicable. No additional management support is needed unless otherwise documented below in the visit note. 

## 2013-10-04 NOTE — Progress Notes (Signed)
OFFICE NOTE  10/04/2013  CC:  Chief Complaint  Patient presents with  . Arm Injury    R sided in May  . Medication Refill    ritalin rf   HPI: Patient is a 58 y.o. Caucasian male who is here for right arm injury. Hurt it a couple of months ago when he fell on it setting up his CPAP machine. No arm swelling.  He got a few wrist abrasions that have now healed.   He is not really sure the mechanism of the arm injury.  Repetitive use of right arm with keyboard during day has it aggravated. Right tennis elbow band not helping this time.  Needs metadate CD refill, uses this rarely, no adverse side effects (has a wedding coming up with a lot of family members).  Wants hearing screen today b/c wife says he doesn't hear her well.  He thinks he hears just fine.  Pertinent PMH:  Past Medical History  Diagnosis Date  . Allergic rhinitis   . Dermatitis   . Obesity   . OSA (obstructive sleep apnea)     Severe.  Wears CPAP but pt does not know his setting.  (Dr. Melvyn Novas follows him)  . Anxiety     claustrophobic  . Colon polyps 2012 and 2013    All hyperplastic except one adenomatous w/out high grade dysplasia (recall 2023 per Dr. Deatra Ina)  . Adult ADHD    Past Surgical History  Procedure Laterality Date  . Mouth surgery    . Colonoscopy w/ polypectomy  04/2010; 10/2011    Initial screening TCS showed 6 polyps (one adenomatous).  Pt says he returned for repeat TCS 10/2011 showed one hyperplastic polyp: Recall 10 yrs per Dr. Deatra Ina   History   Social History Narrative   Married, 1 child--grown.   Occupation: Financial planner"   Tobacco: 20 pack-yr hx; quit 2004.   Alcohol: nightly cocktail   Drugs: none   Exercise: "walk some when I can".   Diet: normal.          MEDS:  Outpatient Prescriptions Prior to Visit  Medication Sig Dispense Refill  . methylphenidate (METADATE CD) 20 MG CR capsule Take 1 capsule (20 mg total) by mouth daily as needed.  30 capsule  0  . mupirocin ointment  (BACTROBAN) 2 % Apply topically 3 (three) times daily.  22 g  0  . triamcinolone cream (KENALOG) 0.1 % Apply topically 2 (two) times daily as needed.  30 g  0   No facility-administered medications prior to visit.    PE: Blood pressure 148/91, pulse 98, temperature 99.4 F (37.4 C), temperature source Temporal, resp. rate 18, height 5\' 10"  (1.778 m), weight 267 lb (121.11 kg), SpO2 95.00%. Gen: Alert, well appearing.  Patient is oriented to person, place, time, and situation. ROM of shoulders intact, with the exception of slight pain in Right AC joint area with full EF and abduction of right arm. Mild pain with palpation of biceps tendon near where it runs under edge of deltoid medially.  Mild pain in this same area with resisted supination of right forearm.   Right elbow with point tenderness over the proximal 1-2 cm of the extensor mechanism near it's insertion into right elbow. No swelling or erythema or limitation of ROM.  NO right arm weakness. Pain in right elbow with resisted supination of right forearm.  Hand grip is strong, no muscle atrophy.  LAB: none today   Hearing Screening   125Hz  250Hz  500Hz   1000Hz  2000Hz  4000Hz  8000Hz   Right ear:   20 20 20  40   Left ear:   20 20 20  35    IMPRESSION AND PLAN:  Right elbow pain Lateral epicondylitis, possibly a tear in extensor tendon complex right where it inserts on lat epicondyle. Continue tennis elbow strap for now. Refer to Dr. Tamala Julian in Sports Medicine for further evaluation and management.  Right shoulder pain Suspect combination of mild right AC joint strain plus right biceps tendon strain. Reassured pt, this should just need more time to completely heal.  Hearing impairment Mild presbycusia on audiometry testing today. Reassured pt, no further testing needed at this time.  ATTENTION DEFICIT DISORDER, ADULT The current medical regimen is effective;  continue present plan and medications. Rx for #30 metadate CD 20mg   printed for pt today.  He rarely uses this med.   An After Visit Summary was printed and given to the patient.  FOLLOW UP: 61mo for CPE

## 2013-10-04 NOTE — Assessment & Plan Note (Signed)
The current medical regimen is effective;  continue present plan and medications. Rx for #30 metadate CD 20mg  printed for pt today.  He rarely uses this med.

## 2013-10-04 NOTE — Assessment & Plan Note (Signed)
Suspect combination of mild right AC joint strain plus right biceps tendon strain. Reassured pt, this should just need more time to completely heal.

## 2013-10-25 ENCOUNTER — Other Ambulatory Visit (INDEPENDENT_AMBULATORY_CARE_PROVIDER_SITE_OTHER): Payer: 59

## 2013-10-25 ENCOUNTER — Encounter: Payer: Self-pay | Admitting: Family Medicine

## 2013-10-25 ENCOUNTER — Ambulatory Visit (INDEPENDENT_AMBULATORY_CARE_PROVIDER_SITE_OTHER): Payer: 59 | Admitting: Family Medicine

## 2013-10-25 VITALS — BP 150/84 | HR 81 | Ht 71.0 in | Wt 270.0 lb

## 2013-10-25 DIAGNOSIS — M25521 Pain in right elbow: Secondary | ICD-10-CM

## 2013-10-25 DIAGNOSIS — G5631 Lesion of radial nerve, right upper limb: Secondary | ICD-10-CM

## 2013-10-25 DIAGNOSIS — G5630 Lesion of radial nerve, unspecified upper limb: Secondary | ICD-10-CM | POA: Insufficient documentation

## 2013-10-25 DIAGNOSIS — M25519 Pain in unspecified shoulder: Secondary | ICD-10-CM

## 2013-10-25 DIAGNOSIS — M25511 Pain in right shoulder: Secondary | ICD-10-CM

## 2013-10-25 DIAGNOSIS — M25529 Pain in unspecified elbow: Secondary | ICD-10-CM

## 2013-10-25 MED ORDER — MELOXICAM 15 MG PO TABS: 15.0000 mg | ORAL_TABLET | Freq: Every day | ORAL | Status: DC

## 2013-10-25 NOTE — Assessment & Plan Note (Signed)
Patient shoulder pain he showing impingement is likely secondary to subacromial bursitis. Patient has full strength so I do not think a rotator cuff injury was occurring. Patient given home exercise program and will to anti-inflammatories. Patient continues to have pain like to see him again in 2-3 weeks. At that time we'll to ultrasound likely an intra-articular injection.

## 2013-10-25 NOTE — Patient Instructions (Addendum)
Very nice to meet you.  Ice 20 minutes 2 times daily. Usually after activity and before bed Wear brace day and night for next 2 weeks then nightly for 2 weeks.  Meloxicam daily for 10 days then as needed. . Exercises 3 times a week. Start that in 3 days  Come back in 2 weeks and we will look at shoulder.

## 2013-10-25 NOTE — Assessment & Plan Note (Signed)
Patient has not improved because he has been using a counterforce over the nerve which is causing worsening pain. Patient was given a wrist brace that he can wear daily and night for the next 2 weeks and then daily for 2 weeks. We discussed icing regimen as well as a home exercise program 3 times a week. Patient is also to do prescription anti-inflammatories were prescribed today. Patient and will come back in 23 weeks. If continued pain we may need to consider nitroglycerin.

## 2013-10-25 NOTE — Progress Notes (Signed)
[facility] [address] [place], [place] [address] Phone: [phone] Subjective:    I'm seeing this patient by the request  of:  MCGOWEN,PHILIP H, MD   CC: Right elbow pain, right shoulder pain  [id number] Richard [name] is a 58 y.o. male coming in with complaint of right elbow pain and right shoulder pain. Patient did have a fall back in May. Patient states that he has had this pain for multiple months.  regarding patient's right elbow pain he states that it hurts while he is doing work. Patient does use a keyboard repetitively. Patient states that he's been trying encounter brace with no significant improvement. Patient has not tried icing or any over-the-counter medication. Patient states that this is very sore over the lateral epicondylar region. Denies any weakness or numbness.  Regarding patient's right shoulder pain he notices when he tries to move his arm in internal rotation he has some discomfort mostly over the anterior lateral aspect of the shoulder. Denies any radiation down the arm or any numbness or weakness. Patient states that it is only with certain movements and can have no pain at rest he has not tried any over-the-counter medications at this time. Severity is 7/10.     Past medical history, social, surgical and family history all reviewed in electronic medical record.   Review of Systems: No headache, visual changes, nausea, vomiting, diarrhea, constipation, dizziness, abdominal pain, skin rash, fevers, chills, night sweats, weight loss, swollen lymph nodes, body aches, joint swelling, muscle aches, chest pain, shortness of breath, mood changes.   Objective Blood pressure 150/84, pulse 81, height 5\' 11"  (1.803 m), weight 270 lb (122.471 kg).  General: No apparent distress alert and oriented x3 mood and affect normal, dressed appropriately.  HEENT: Pupils equal, extraocular movements intact  Respiratory: Patient's speak in full  sentences and does not appear short of breath  Cardiovascular: No lower extremity edema, non tender, no erythema  Skin: Warm dry intact with no signs of infection or rash on extremities or on axial skeleton.  Abdomen: Soft nontender  Neuro: Cranial nerves II through XII are intact, neurovascularly intact in all extremities with 2+ DTRs and 2+ pulses.  Lymph: No lymphadenopathy of posterior or anterior cervical chain or axillae bilaterally.  Gait normal with good balance and coordination.  MSK:  Non tender with full range of motion and good stability and symmetric strength and tone of  wrist, hip, knee and ankles bilaterally.  Shoulder: Right Inspection reveals no abnormalities, atrophy or asymmetry. Palpation is normal with no tenderness over AC joint or bicipital groove. ROM is full in all planes. Rotator cuff strength normal throughout. signs of impingement with positive Neer and Hawkin's tests, empty can sign. Speeds and Yergason's tests normal. No labral pathology noted with negative Obrien's, negative clunk and good stability. Normal scapular function observed. No painful arc and no drop arm sign. No apprehension sign    Elbow: Right Unremarkable to inspection. Range of motion full pronation, supination, flexion, extension. Strength is full to all of the above directions Stable to varus, valgus stress. Negative moving valgus stress test. Tenderness over the posterior interosseous nerve as well as mildly over the lateral epicondylar region. Ulnar nerve does not sublux. Negative cubital tunnel Tinel's. Contralateral elbow unremarkable  Musculoskeletal ultrasound was performed and interpreted by Charlann Boxer D.O.   Elbow: Right Lateral epicondyle and common extensor tendon origin visualized. Patient does have some mild hypoechoic changes over the lateral epicondylar tendon.  Patient though does have severe hypoechoic changes over the posterior interosseous nerve approximately 3 cm  from epicondylar region .  Radial head unremarkable and located in annular ligament Medial epicondyle and common flexor tendon origin visualized.  No edema, effusions, or avulsions seen. Ulnar nerve in cubital tunnel unremarkable. Olecranon and triceps insertion visualized and unremarkable without edema, effusion, or avulsion.  No signs olecranon bursitis. Power doppler signal normal.  IMPRESSION: Mild lateral epicondylitis as well as posterior interosseous nerve entrapment    Impression and Recommendations:     This case required medical decision making of moderate complexity.

## 2013-11-03 ENCOUNTER — Encounter: Payer: Self-pay | Admitting: Family Medicine

## 2013-11-03 ENCOUNTER — Ambulatory Visit (INDEPENDENT_AMBULATORY_CARE_PROVIDER_SITE_OTHER): Payer: 59 | Admitting: Family Medicine

## 2013-11-03 VITALS — BP 154/90 | HR 118 | Temp 99.6°F | Resp 24 | Ht 70.0 in | Wt 265.0 lb

## 2013-11-03 DIAGNOSIS — H6120 Impacted cerumen, unspecified ear: Secondary | ICD-10-CM | POA: Insufficient documentation

## 2013-11-03 DIAGNOSIS — H6123 Impacted cerumen, bilateral: Secondary | ICD-10-CM

## 2013-11-03 DIAGNOSIS — J180 Bronchopneumonia, unspecified organism: Secondary | ICD-10-CM

## 2013-11-03 HISTORY — DX: Bronchopneumonia, unspecified organism: J18.0

## 2013-11-03 MED ORDER — IPRATROPIUM BROMIDE 0.02 % IN SOLN
0.5000 mg | Freq: Once | RESPIRATORY_TRACT | Status: AC
Start: 2013-11-03 — End: 2013-11-03
  Administered 2013-11-03: 0.5 mg via RESPIRATORY_TRACT

## 2013-11-03 MED ORDER — ALBUTEROL SULFATE (2.5 MG/3ML) 0.083% IN NEBU
2.5000 mg | INHALATION_SOLUTION | Freq: Once | RESPIRATORY_TRACT | Status: AC
  Administered 2013-11-03: 2.5 mg via RESPIRATORY_TRACT

## 2013-11-03 MED ORDER — ALBUTEROL SULFATE HFA 108 (90 BASE) MCG/ACT IN AERS: 1.0000 | INHALATION_SPRAY | RESPIRATORY_TRACT | Status: DC | PRN

## 2013-11-03 MED ORDER — AZITHROMYCIN 250 MG PO TABS: ORAL_TABLET | ORAL | Status: DC

## 2013-11-03 MED ORDER — HYDROCODONE-HOMATROPINE 5-1.5 MG/5ML PO SYRP: ORAL_SOLUTION | ORAL | Status: DC

## 2013-11-03 MED ORDER — PREDNISONE 20 MG PO TABS: ORAL_TABLET | ORAL | Status: DC

## 2013-11-03 NOTE — Patient Instructions (Signed)
Take OTC mucinex DM every morning for cough. Stop alka seltzer cold plus. Stop tessalon perles (benzonatate).

## 2013-11-03 NOTE — Progress Notes (Signed)
OFFICE NOTE  11/03/2013  CC:  Chief Complaint  Patient presents with  . URI    x 10 days   . Cough     HPI: Patient is a 58 y.o. Caucasian male who is here for resp complaints--getting worse.   Onset 10d/a with nasal mucous/PND, cough, watery eyes, some cloggy feeling in ears, has some fatigue and SOB. Cough worse hs when supine, lots of mucous production.  +Wheezing --coarse girgle--intermittently.  Low grade fever for about the last week or so.  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  Pertinent PMH:  Past medical, surgical, social, and family history reviewed and no changes are noted since last office visit.  MEDS:  Outpatient Prescriptions Prior to Visit  Medication Sig Dispense Refill  . meloxicam (MOBIC) 15 MG tablet Take 1 tablet (15 mg total) by mouth daily.  30 tablet  0  . methylphenidate (METADATE CD) 20 MG CR capsule Take 1 capsule (20 mg total) by mouth daily as needed.  30 capsule  0  . mupirocin ointment (BACTROBAN) 2 % Apply topically 3 (three) times daily.  22 g  0  . triamcinolone cream (KENALOG) 0.1 % Apply topically 2 (two) times daily as needed.  30 g  0   No facility-administered medications prior to visit.    PE: Blood pressure 154/90, pulse 118, temperature 99.6 F (37.6 C), temperature source Temporal, resp. rate 24, height 5\' 10"  (1.778 m), weight 265 lb (120.203 kg), SpO2 91.00%. Gen: tired appearing but Nontoxic.  RR 40s but no distress. HEENT: eyes without injection, drainage, or swelling.  Ears: EACs with 100 % impactions of cerumen bilat.   After nurse irrigated them, TMs with normal light reflex and landmarks.  Nose: Clear rhinorrhea, with some dried, crusty exudate adherent to mildly injected mucosa.  No purulent d/c.  No paranasal sinus TTP.  No facial swelling.  Throat and mouth without focal lesion.  No pharyngial swelling, erythema, or exudate.   Neck: supple, no LAD.   LUNGS: Diffuse coarse insp exp rhonchi which  clear with coughing.  However, some coarse exp wheezing and prolonged exp phase are present and he has mildly diminished breath sounds in both bases, nonlabored resps.  +Post-exhalation coughing. He had great improvement of aeration and clearing of wheezing s/p alb/atr neb in office today. CV: Regular, tachy to 120, no m/r/g. EXT: no c/c/e SKIN: no rash  LAB: none   IMPRESSION AND PLAN:  1) Bronchopneumonia, suspect bacterial etiology. Alb/atr neb in office today: Prednisone 40mg  qd x 5d, then 20mg  qd x 5d. Z-pack. Albut HFA rx'd.   Recommended mucinex DM qAM and may use hycodan susp qhs (#120 ml). Signs/symptoms to call or return for were reviewed and pt expressed understanding.  2) Cerumen impaction: nurse irrigated both ears today 100% clear.  An After Visit Summary was printed and given to the patient.  FOLLOW UP: prn

## 2013-11-03 NOTE — Progress Notes (Signed)
Pre visit review using our clinic review tool, if applicable. No additional management support is needed unless otherwise documented below in the visit note. 

## 2013-11-09 ENCOUNTER — Ambulatory Visit (INDEPENDENT_AMBULATORY_CARE_PROVIDER_SITE_OTHER): Payer: 59 | Admitting: Family Medicine

## 2013-11-09 ENCOUNTER — Other Ambulatory Visit (INDEPENDENT_AMBULATORY_CARE_PROVIDER_SITE_OTHER): Payer: 59

## 2013-11-09 ENCOUNTER — Encounter: Payer: Self-pay | Admitting: Family Medicine

## 2013-11-09 VITALS — BP 126/84 | HR 76 | Ht 70.0 in | Wt 264.0 lb

## 2013-11-09 DIAGNOSIS — G5631 Lesion of radial nerve, right upper limb: Secondary | ICD-10-CM

## 2013-11-09 DIAGNOSIS — G5630 Lesion of radial nerve, unspecified upper limb: Secondary | ICD-10-CM

## 2013-11-09 DIAGNOSIS — M25511 Pain in right shoulder: Secondary | ICD-10-CM

## 2013-11-09 DIAGNOSIS — M7551 Bursitis of right shoulder: Secondary | ICD-10-CM

## 2013-11-09 DIAGNOSIS — M25519 Pain in unspecified shoulder: Secondary | ICD-10-CM

## 2013-11-09 DIAGNOSIS — IMO0002 Reserved for concepts with insufficient information to code with codable children: Secondary | ICD-10-CM

## 2013-11-09 DIAGNOSIS — M7550 Bursitis of unspecified shoulder: Secondary | ICD-10-CM | POA: Insufficient documentation

## 2013-11-09 DIAGNOSIS — M75100 Unspecified rotator cuff tear or rupture of unspecified shoulder, not specified as traumatic: Secondary | ICD-10-CM

## 2013-11-09 NOTE — Assessment & Plan Note (Signed)
The patient is doing remarkably well with conservative therapy. We will continue with bracing, icing, and home exercises. Patient is doing very well at this time and will come back in 3 weeks for further evaluation and treatment.

## 2013-11-09 NOTE — Progress Notes (Signed)
[facility] [address] [place], [place] [address] Phone: [phone] Subjective:      CC: Right elbow pain, right shoulder pain follow up  [id number] Richard [name] is a 58 y.o. male coming in with complaint of right elbow pain and right shoulder pain.   Patient did have lateral epicondylitis at last visit. Patient was given home exercises, bracing, icing protocol. Patient states he is approximately 90% better. Patient has some soreness when he is not wearing the wrist brace but overall doing very well. Denies any nighttime awakening. Denies any radiation of pain or any weakness. Patient is very happy with the results so far.  Patient also last time had what appeared to be more impingement signs of his shoulder. Patient was given home exercises to attempt. Patient was also given a prescription for anti-inflammatories. Patient states that unfortunately his shoulder continues to give him pain. Worse with certain movements. He lays on that side is in some pain. Denies any radiation to his neck though. Did respond minorly to the anti-inflammatories.    Past medical history, social, surgical and family history all reviewed in electronic medical record.   Review of Systems: No headache, visual changes, nausea, vomiting, diarrhea, constipation, dizziness, abdominal pain, skin rash, fevers, chills, night sweats, weight loss, swollen lymph nodes, body aches, joint swelling, muscle aches, chest pain, shortness of breath, mood changes.   Objective Blood pressure 126/84, pulse 76, height 5\' 10"  (1.778 m), weight 264 lb (119.75 kg), SpO2 93.00%.  General: No apparent distress alert and oriented x3 mood and affect normal, dressed appropriately.  HEENT: Pupils equal, extraocular movements intact  Respiratory: Patient's speak in full sentences and does not appear short of breath  Cardiovascular: No lower extremity edema, non tender, no erythema  Skin: Warm dry intact  with no signs of infection or rash on extremities or on axial skeleton.  Abdomen: Soft nontender  Neuro: Cranial nerves II through XII are intact, neurovascularly intact in all extremities with 2+ DTRs and 2+ pulses.  Lymph: No lymphadenopathy of posterior or anterior cervical chain or axillae bilaterally.  Gait normal with good balance and coordination.  MSK:  Non tender with full range of motion and good stability and symmetric strength and tone of  wrist, hip, knee and ankles bilaterally.  Shoulder: Right Inspection reveals no abnormalities, atrophy or asymmetry. Palpation is normal with no tenderness over AC joint or bicipital groove. ROM is full in all planes passively. Rotator cuff strength normal throughout. signs of impingement with positive Neer and Hawkin's tests, but negative empty can sign. Speeds and Yergason's tests normal. No labral pathology noted with negative Obrien's, negative clunk and good stability. Normal scapular function observed. No painful arc and no drop arm sign. No apprehension sign  MSK US performed of: Right This study was ordered, performed, and interpreted by Charlann Boxer D.O.  Shoulder:   Supraspinatus:  Appears normal on long and transverse views, Bursal bulge seen with shoulder abduction on impingement view. Infraspinatus:  Appears normal on long and transverse views. Significant increase in Doppler flow Subscapularis:  Appears normal on long and transverse views. Positive bursa Teres Minor:  Appears normal on long and transverse views. AC joint:  Capsule undistended, no geyser sign. Glenohumeral Joint:  Appears normal without effusion. Glenoid Labrum:  Intact without visualized tears. Biceps Tendon:  Appears normal on long and transverse views, no fraying of tendon, tendon located in intertubercular groove, no subluxation with shoulder internal or external rotation.  Impression: Subacromial bursitis  Procedure: Real-time Ultrasound Guided Injection  of right glenohumeral joint Device: GE Logiq E  Ultrasound guided injection is preferred based studies that show increased duration, increased effect, greater accuracy, decreased procedural pain, increased response rate with ultrasound guided versus blind injection.  Verbal informed consent obtained.  Time-out conducted.  Noted no overlying erythema, induration, or other signs of local infection.  Skin prepped in a sterile fashion.  Local anesthesia: Topical Ethyl chloride.  With sterile technique and under real time ultrasound guidance:  Joint visualized.  23g 1  inch needle inserted posterior approach. Pictures taken for needle placement. Patient did have injection of 2 cc of 1% lidocaine, 2 cc of 0.5% Marcaine, and 1.0 cc of Kenalog 40 mg/dL. Completed without difficulty  Pain immediately resolved suggesting accurate placement of the medication.  Advised to call if fevers/chills, erythema, induration, drainage, or persistent bleeding.  Images permanently stored and available for review in the ultrasound unit.  Impression: Technically successful ultrasound guided injection.     Elbow: Right Unremarkable to inspection. Range of motion full pronation, supination, flexion, extension. Strength is full to all of the above directions Stable to varus, valgus stress. Negative moving valgus stress test. Tender mildly over the lateral epicondylar region but significantly better. Ulnar nerve does not sublux. Negative cubital tunnel Tinel's. Contralateral elbow unremarkable     Impression and Recommendations:     This case required medical decision making of moderate complexity.

## 2013-11-09 NOTE — Patient Instructions (Signed)
Great to see you Try to wear the brace less during the day.  Slowly decrease 30 minutes daily.  Wear the brace  Ice is still your friend.  Meloxicam if you need it.  See me again in 3 weeks.

## 2013-11-09 NOTE — Assessment & Plan Note (Signed)
Patient was given an injection today. Patient tolerated the procedure well with good resolution of pain. Home exercise programs given and we discussed icing protocol. Patient is going to start these interventions are more regular basis and come back and see me again in 3 weeks.  Spent greater than 25 minutes with patient face-to-face and had greater than 50% of counseling including as described above in assessment and plan.

## 2013-12-07 ENCOUNTER — Ambulatory Visit: Payer: BC Managed Care – PPO | Admitting: Pulmonary Disease

## 2013-12-07 ENCOUNTER — Ambulatory Visit: Payer: 59 | Admitting: Family Medicine

## 2013-12-14 ENCOUNTER — Ambulatory Visit (INDEPENDENT_AMBULATORY_CARE_PROVIDER_SITE_OTHER): Payer: 59 | Admitting: Family Medicine

## 2013-12-14 ENCOUNTER — Encounter: Payer: Self-pay | Admitting: Family Medicine

## 2013-12-14 VITALS — BP 132/82 | HR 76 | Ht 71.0 in | Wt 258.0 lb

## 2013-12-14 DIAGNOSIS — IMO0002 Reserved for concepts with insufficient information to code with codable children: Secondary | ICD-10-CM

## 2013-12-14 DIAGNOSIS — M75100 Unspecified rotator cuff tear or rupture of unspecified shoulder, not specified as traumatic: Secondary | ICD-10-CM

## 2013-12-14 DIAGNOSIS — M7551 Bursitis of right shoulder: Secondary | ICD-10-CM

## 2013-12-14 NOTE — Progress Notes (Signed)
  [facility] [address] [place], [place] [address] Phone: [phone] Subjective:    CC: Right elbow pain, right shoulder pain follow up  [id number] Richard [name] is a 58 y.o. male coming in with complaint of right elbow pain and right shoulder pain.   Patient did have lateral epicondylitis. Is doing well with conservative therapy. Patient denies any re\re exacerbation and overall able to do all ADL's. Patient denies any radiation of pain or any numbness.   Patient also last time had what appeared to be more impingement signs of his shoulder. Patient was found to have a subacromial bursitis on ultrasound and was given a corticosteroid injection. Patient was to do home exercises, icing, as well as topical medication. Patient states he is approximately 99% better. Patient states it does have sometimes a sharp pain that lasts second tendon seems to go away. Patient states it only happens when he reaches down. Patient denies though any weakness. States that he is able to sleep comfortably. Is not taking any medications for pain at all at this time.    Past medical history, social, surgical and family history all reviewed in electronic medical record.   Review of Systems: No headache, visual changes, nausea, vomiting, diarrhea, constipation, dizziness, abdominal pain, skin rash, fevers, chills, night sweats, weight loss, swollen lymph nodes, body aches, joint swelling, muscle aches, chest pain, shortness of breath, mood changes.   Objective Blood pressure 132/82, pulse 76, height 5\' 11"  (1.803 m), weight 258 lb (117.028 kg), SpO2 97.00%.  General: No apparent distress alert and oriented x3 mood and affect normal, dressed appropriately.  HEENT: Pupils equal, extraocular movements intact  Respiratory: Patient's speak in full sentences and does not appear short of breath  Cardiovascular: No lower extremity edema, non tender, no erythema  Skin: Warm dry intact  with no signs of infection or rash on extremities or on axial skeleton.  Abdomen: Soft nontender  Neuro: Cranial nerves II through XII are intact, neurovascularly intact in all extremities with 2+ DTRs and 2+ pulses.  Lymph: No lymphadenopathy of posterior or anterior cervical chain or axillae bilaterally.  Gait normal with good balance and coordination.  MSK:  Non tender with full range of motion and good stability and symmetric strength and tone of  wrist, hip, knee and ankles bilaterally.   Elbow: Right Unremarkable to inspection. Range of motion full pronation, supination, flexion, extension. Strength is full to all of the above directions Stable to varus, valgus stress. Negative moving valgus stress test. Non tender Ulnar nerve does not sublux. Negative cubital tunnel Tinel's. Contralateral elbow unremarkable Shoulder: right Inspection reveals no abnormalities, atrophy or asymmetry. Palpation is normal with no tenderness over AC joint or bicipital groove. ROM is full in all planes. Rotator cuff strength normal throughout. No signs of impingement with negative Neer and Hawkin's tests, empty can sign. Speeds and Yergason's tests normal. No labral pathology noted with negative Obrien's, negative clunk and good stability. Normal scapular function observed. No painful arc and no drop arm sign. No apprehension sign        Impression and Recommendations:     This case required medical decision making of moderate complexity.

## 2013-12-14 NOTE — Patient Instructions (Signed)
It is good to see you Compression to upper arm if pain continues Stop brace at night.  Continue any activity except heavy bench press or flys See me when you need me.

## 2013-12-14 NOTE — Assessment & Plan Note (Signed)
To have resolved after a steroid injection. We are going to continue to monitor. Encourage him to continue to do the exercises 3 times a week for the next 6 weeks. Patient will followup on an as-needed basis.

## 2013-12-15 ENCOUNTER — Encounter: Payer: Self-pay | Admitting: Pulmonary Disease

## 2013-12-15 ENCOUNTER — Ambulatory Visit (INDEPENDENT_AMBULATORY_CARE_PROVIDER_SITE_OTHER): Payer: 59 | Admitting: Pulmonary Disease

## 2013-12-15 VITALS — BP 122/64 | HR 96 | Temp 98.0°F | Ht 71.0 in | Wt 257.6 lb

## 2013-12-15 DIAGNOSIS — G4733 Obstructive sleep apnea (adult) (pediatric): Secondary | ICD-10-CM

## 2013-12-15 NOTE — Progress Notes (Signed)
   Subjective:    Patient ID: Richard [name], male    DOB: July 03, 1955, 58 y.o.   MRN: [id number]  HPI The patient comes in today for followup of his obstructive sleep apnea. He is wearing CPAP compliantly, and is having no issues with his mask fit or pressure. He feels that he sleeps well with the device, and is satisfied with his daytime alertness. He has lost 14 pounds since the last visit.   Review of Systems  Constitutional: Negative for fever and unexpected weight change.  HENT: Negative for congestion, dental problem, ear pain, nosebleeds, postnasal drip, rhinorrhea, sinus pressure, sneezing, sore throat and trouble swallowing.   Eyes: Negative for redness and itching.  Respiratory: Negative for cough, chest tightness, shortness of breath and wheezing.   Cardiovascular: Negative for palpitations and leg swelling.  Gastrointestinal: Negative for nausea and vomiting.  Genitourinary: Negative for dysuria.  Musculoskeletal: Negative for joint swelling.  Skin: Negative for rash.  Neurological: Negative for headaches.  Hematological: Does not bruise/bleed easily.  Psychiatric/Behavioral: Negative for dysphoric mood. The patient is not nervous/anxious.        Objective:   Physical Exam Overweight male in no acute distress Nose without purulence or discharge noted No skin breakdown or pressure necrosis from the CPAP mask Neck without lymphadenopathy or thyromegaly Lower extremities without edema, no cyanosis Alert and oriented, moves all 4 extremities.       Assessment & Plan:

## 2013-12-15 NOTE — Assessment & Plan Note (Signed)
The patient is doing very well from a sleep apnea standpoint on CPAP. He has lost 14 pounds since last visit, and is staying compliant on his device.

## 2013-12-15 NOTE — Patient Instructions (Signed)
Stay on cpap, and keep up with mask changes and supplies Keep working on weight loss, you are doing great. followup with me again in one year.

## 2014-02-23 ENCOUNTER — Encounter: Payer: 59 | Admitting: Family Medicine

## 2014-02-28 ENCOUNTER — Ambulatory Visit (INDEPENDENT_AMBULATORY_CARE_PROVIDER_SITE_OTHER): Payer: 59 | Admitting: Family Medicine

## 2014-02-28 ENCOUNTER — Encounter: Payer: Self-pay | Admitting: Family Medicine

## 2014-02-28 VITALS — BP 170/90 | HR 115 | Temp 99.6°F | Resp 20 | Ht 70.0 in | Wt 256.0 lb

## 2014-02-28 DIAGNOSIS — F900 Attention-deficit hyperactivity disorder, predominantly inattentive type: Secondary | ICD-10-CM

## 2014-02-28 DIAGNOSIS — Z0000 Encounter for general adult medical examination without abnormal findings: Secondary | ICD-10-CM

## 2014-02-28 DIAGNOSIS — L219 Seborrheic dermatitis, unspecified: Secondary | ICD-10-CM

## 2014-02-28 DIAGNOSIS — Z125 Encounter for screening for malignant neoplasm of prostate: Secondary | ICD-10-CM

## 2014-02-28 DIAGNOSIS — F909 Attention-deficit hyperactivity disorder, unspecified type: Secondary | ICD-10-CM

## 2014-02-28 MED ORDER — KETOCONAZOLE 2 % EX CREA: TOPICAL_CREAM | CUTANEOUS | Status: DC

## 2014-02-28 MED ORDER — METHYLPHENIDATE HCL ER (CD) 20 MG PO CPCR: 20.0000 mg | ORAL_CAPSULE | Freq: Every day | ORAL | Status: DC | PRN

## 2014-02-28 NOTE — Assessment & Plan Note (Signed)
Trial of ketoconazole 2% cream bid prn.

## 2014-02-28 NOTE — Assessment & Plan Note (Signed)
The current medical regimen is effective;  continue present plan and medications. I printed rx's for Metadate CD 20mg , 1 qd #30 today for this month, Jan 2016, and Feb 2016.  Appropriate fill on/after date was noted on each rx.

## 2014-02-28 NOTE — Progress Notes (Signed)
Office Note 02/28/2014  CC:  Chief Complaint  Patient presents with  . Annual Exam   HPI:  Richard [name] is a 58 y.o. White male who is here for annual CPE. Feeling well, no acute complaints except for dry and flaky skin on face and scalp lately that feels somewhat irritated.  No red or flesh colored bumps, no itching. He is not fasting. He takes his metadate CD intermittently "when I have to behave", says he needs RF. Pt says occ bp check outside of medical office has been normal in the past.   Past Medical History  Diagnosis Date  . Allergic rhinitis   . Dermatitis   . Obesity   . OSA on CPAP     Severe.  Wears CPAP but pt does not know his setting.  (Dr. Melvyn Novas follows him)  . Anxiety     claustrophobic  . Colon polyps 2012 and 2013    All hyperplastic except one adenomatous w/out high grade dysplasia (recall 2023 per Dr. Deatra Ina)  . Adult ADHD   . Hyperlipidemia     Past Surgical History  Procedure Laterality Date  . Mouth surgery    . Colonoscopy w/ polypectomy  04/2010; 10/2011    Initial screening TCS showed 6 polyps (one adenomatous).  Pt says he returned for repeat TCS 10/2011 showed one hyperplastic polyp: Recall 10 yrs per Dr. Deatra Ina    Family History  Problem Relation Age of Onset  . Lung cancer Father   . Cancer Father     nasal  . Allergies Father   . Colon cancer Neg Hx   . Esophageal cancer Neg Hx   . Rectal cancer Neg Hx   . Stomach cancer Neg Hx     History   Social History  . Marital Status: Married    Spouse Name: N/A    Number of Children: N/A  . Years of Education: N/A   Occupational History  . Counselling psychologist    Social History Main Topics  . Smoking status: Former Smoker -- 1.00 packs/day    Types: Cigarettes    Quit date: 07/17/2002  . Smokeless tobacco: Never Used  . Alcohol Use: 3.5 oz/week    7 drink(s) per week     Comment: wine every night with dinner  . Drug Use: No  . Sexual Activity: Not on file   Other  Topics Concern  . Not on file   Social History Narrative   Married, 1 child--grown.   Occupation: Financial planner"   Tobacco: 20 pack-yr hx; quit 2004.   Alcohol: nightly cocktail   Drugs: none   Exercise: "walk some when I can".   Diet: normal.          Outpatient Prescriptions Prior to Visit  Medication Sig Dispense Refill  . mupirocin ointment (BACTROBAN) 2 % Apply topically 3 (three) times daily. 22 g 0  . triamcinolone cream (KENALOG) 0.1 % Apply topically 2 (two) times daily as needed. 30 g 0  . methylphenidate (METADATE CD) 20 MG CR capsule Take 1 capsule (20 mg total) by mouth daily as needed. 30 capsule 0   No facility-administered medications prior to visit.    No Known Allergies  ROS Review of Systems  Constitutional: Negative for fever, chills, appetite change and fatigue.  HENT: Negative for congestion, dental problem, ear pain and sore throat.   Eyes: Negative for discharge, redness and visual disturbance.  Respiratory: Negative for cough, chest tightness, shortness of breath and wheezing.  Cardiovascular: Negative for chest pain, palpitations and leg swelling.  Gastrointestinal: Negative for nausea, vomiting, abdominal pain, diarrhea and blood in stool.  Genitourinary: Negative for dysuria, urgency, frequency, hematuria, flank pain and difficulty urinating.  Musculoskeletal: Negative for myalgias, back pain, joint swelling, arthralgias and neck stiffness.  Skin: Negative for pallor and rash.  Neurological: Negative for dizziness, speech difficulty, weakness and headaches.  Hematological: Negative for adenopathy. Does not bruise/bleed easily.  Psychiatric/Behavioral: Negative for confusion and sleep disturbance. The patient is not nervous/anxious.     PE; Blood pressure 170/90, pulse 115, temperature 99.6 F (37.6 C), temperature source Temporal, resp. rate 20, height 5\' 10"  (1.778 m), weight 256 lb (116.121 kg), SpO2 95 %. Gen: Alert, well appearing.  Patient  is oriented to person, place, time, and situation. AFFECT: pleasant, lucid thought and speech. ENT: Ears: EACs clear, normal epithelium.  TMs with good light reflex and landmarks bilaterally.  Eyes: no injection, icteris, swelling, or exudate.  EOMI, PERRLA. Nose: no drainage or turbinate edema/swelling.  No injection or focal lesion.  Mouth: lips without lesion/swelling.  Oral mucosa pink and moist.  Dentition intact and without obvious caries or gingival swelling.  Oropharynx without erythema, exudate, or swelling.  Neck: supple/nontender.  No LAD, mass, or TM.  Carotid pulses 2+ bilaterally, without bruits. CV: RRR, no m/r/g.   LUNGS: CTA bilat, nonlabored resps, good aeration in all lung fields. ABD: soft, NT, ND (large diastasis recti protrusion when he attempts to sit up, BS normal.  No hepatospenomegaly or mass.  No bruits. EXT: no clubbing, cyanosis, or edema.  Musculoskeletal: no joint swelling, erythema, warmth, or tenderness.  ROM of all joints intact. Skin - no sores or suspicious lesions or rashes or color changes  Pertinent labs:  none  ASSESSMENT AND PLAN:   Health maintenance examination Reviewed age and gender appropriate health maintenance issues (prudent diet, regular exercise, health risks of tobacco and excessive alcohol, use of seatbelts, fire alarms in home, use of sunscreen).  Also reviewed age and gender appropriate health screening as well as vaccine recommendations. Pt declines flu vaccine.  Pt to return when fasting to get CBC, CMET, TSH, FLP, and screening PSA. DRE normal today.  He is UTD on colon cancer screening. He'll call his resp therapy/CPAP supplier and inquire about getting mask change/help with fitting.  If he needs our office's help he'll call and let us know.  Seborrheic dermatitis Trial of ketoconazole 2% cream bid prn.  Adult ADHD The current medical regimen is effective;  continue present plan and medications. I printed rx's for Metadate CD  20mg , 1 qd #30 today for this month, Jan 2016, and Feb 2016.  Appropriate fill on/after date was noted on each rx.   An After Visit Summary was printed and given to the patient.  FOLLOW UP:  Return for pt to return for fasting labs at his convenience.

## 2014-02-28 NOTE — Progress Notes (Signed)
Pre visit review using our clinic review tool, if applicable. No additional management support is needed unless otherwise documented below in the visit note. 

## 2014-02-28 NOTE — Assessment & Plan Note (Signed)
Reviewed age and gender appropriate health maintenance issues (prudent diet, regular exercise, health risks of tobacco and excessive alcohol, use of seatbelts, fire alarms in home, use of sunscreen).  Also reviewed age and gender appropriate health screening as well as vaccine recommendations. Pt declines flu vaccine.  Pt to return when fasting to get CBC, CMET, TSH, FLP, and screening PSA. DRE normal today.  He is UTD on colon cancer screening. He'll call his resp therapy/CPAP supplier and inquire about getting mask change/help with fitting.  If he needs our office's help he'll call and let us know.

## 2014-04-18 ENCOUNTER — Telehealth: Payer: Self-pay | Admitting: Family Medicine

## 2014-04-18 DIAGNOSIS — L219 Seborrheic dermatitis, unspecified: Secondary | ICD-10-CM

## 2014-04-18 NOTE — Telephone Encounter (Signed)
Derm referral ordered as per pt request.

## 2014-04-18 NOTE — Telephone Encounter (Signed)
Please advise 

## 2014-04-18 NOTE — Telephone Encounter (Signed)
Pt is requestin a referral to the Dermatologist as his rash is not getting better and is very bothersome. Please contact him with information/DH

## 2014-04-20 ENCOUNTER — Encounter: Payer: Self-pay | Admitting: Family Medicine

## 2014-05-09 ENCOUNTER — Telehealth: Payer: Self-pay | Admitting: Pulmonary Disease

## 2014-05-09 NOTE — Telephone Encounter (Signed)
Spoke with pt, states that he will be doing some international travel in the next couple of months and needs a travel cpap through [place] as well as a letter stating the necessity of him bringing his cpap with him on his flight.  This letter can be mailed to his home address, verified address on file.    Pt also wants to know if [facility] has a recommendation on a travel cpap.  [facility] had suggested either the Z1 or Transcend.    [place] please advise if you are ok with this letter, and if you have any recs on a travel cpap.  Thanks!

## 2014-05-09 NOTE — Telephone Encounter (Signed)
Letter done. I do not know a lot about the various travel device.

## 2014-05-09 NOTE — Telephone Encounter (Signed)
Called spoke w/ pt and made aware. Letter mailed to pt. Nothing further needed

## 2014-05-11 ENCOUNTER — Ambulatory Visit (HOSPITAL_BASED_OUTPATIENT_CLINIC_OR_DEPARTMENT_OTHER)
Admission: RE | Admit: 2014-05-11 | Discharge: 2014-05-11 | Disposition: A | Discharge status: Home / Self Care | Payer: 59 | Source: Ambulatory Visit | Attending: Nurse Practitioner | Admitting: Nurse Practitioner

## 2014-05-11 ENCOUNTER — Encounter: Payer: Self-pay | Admitting: Nurse Practitioner

## 2014-05-11 ENCOUNTER — Ambulatory Visit (INDEPENDENT_AMBULATORY_CARE_PROVIDER_SITE_OTHER): Payer: 59 | Admitting: Nurse Practitioner

## 2014-05-11 VITALS — BP 130/64 | HR 110 | Temp 98.7°F | Ht 70.0 in | Wt 259.0 lb

## 2014-05-11 DIAGNOSIS — M79671 Pain in right foot: Secondary | ICD-10-CM

## 2014-05-11 DIAGNOSIS — S92424A Nondisplaced fracture of distal phalanx of right great toe, initial encounter for closed fracture: Secondary | ICD-10-CM | POA: Diagnosis not present

## 2014-05-11 DIAGNOSIS — M79674 Pain in right toe(s): Secondary | ICD-10-CM | POA: Diagnosis present

## 2014-05-11 DIAGNOSIS — W109XXA Fall (on) (from) unspecified stairs and steps, initial encounter: Secondary | ICD-10-CM | POA: Insufficient documentation

## 2014-05-11 NOTE — Progress Notes (Signed)
   Subjective:    Patient ID: Richard [name], male    DOB: 1956-02-04, 59 y.o.   MRN: [id number]  Foot Injury  The incident occurred 3 to 5 days ago. The incident occurred at home (slipped on top stair, fell down 16 steps w/R foot under body). The injury mechanism was a fall. The pain is present in the right foot. The pain is mild. The pain has been intermittent since onset. Pertinent negatives include no inability to bear weight, loss of motion, loss of sensation, muscle weakness, numbness or tingling. Associated symptoms comments: Pain in top of foot for plantar flexes. He reports no foreign bodies present. The symptoms are aggravated by movement. He has tried NSAIDs for the symptoms. The treatment provided mild relief.      Review of Systems  Musculoskeletal:       Bruising & swelling of R foot & toes No c/o pain in R knee & hip  Neurological: Negative for tingling and numbness.       Objective:   Physical Exam  Constitutional: He is oriented to person, place, and time. He appears well-developed and well-nourished. No distress.  Obese, fidgety  HENT:  Head: Normocephalic and atraumatic.  Eyes: Conjunctivae are normal. Right eye exhibits no discharge. Left eye exhibits no discharge.  Wearing glasses  Cardiovascular:  Mild tachycardia, takes metadate  Pulmonary/Chest: Effort normal.  Musculoskeletal: He exhibits edema and tenderness.       Feet:  Tender at top of foot & 1st MTP. FROM in ankle. No malleolar tenderness.  Neurological: He is alert and oriented to person, place, and time.  Skin: Skin is warm and dry.  Superficial abrasions just above ankle, scabbed.  Psychiatric: He has a normal mood and affect. His behavior is normal. Thought content normal.  Vitals reviewed.         Assessment & Plan:  1. Foot pain, right Fall, injury, 16 steps - DG Foot Complete Right; Future Continue wearing hard bottomed shoe Wear nonslip socks or shoes in house F/u based on  xrays.

## 2014-05-11 NOTE — Progress Notes (Signed)
Pre visit review using our clinic review tool, if applicable. No additional management support is needed unless otherwise documented below in the visit note. 

## 2014-05-11 NOTE — Patient Instructions (Signed)
pleASE GET xRAY OF FOOT. My office will call with results and follow up. Continue to wear stiff bottom shoe.

## 2014-05-12 ENCOUNTER — Encounter: Payer: Self-pay | Admitting: Family Medicine

## 2014-05-12 ENCOUNTER — Telehealth: Payer: Self-pay | Admitting: Nurse Practitioner

## 2014-05-12 DIAGNOSIS — S92911A Unspecified fracture of right toe(s), initial encounter for closed fracture: Secondary | ICD-10-CM

## 2014-05-12 NOTE — Telephone Encounter (Signed)
Patient is scheduled with Dr. Andrez [facility] 05/13/14

## 2014-05-12 NOTE — Telephone Encounter (Signed)
Patient notified of results and referral 

## 2014-05-18 ENCOUNTER — Encounter: Payer: Self-pay | Admitting: Nurse Practitioner

## 2014-05-18 DIAGNOSIS — S92919A Unspecified fracture of unspecified toe(s), initial encounter for closed fracture: Secondary | ICD-10-CM | POA: Insufficient documentation

## 2014-05-25 ENCOUNTER — Ambulatory Visit: Payer: Self-pay | Admitting: Nurse Practitioner

## 2014-08-09 ENCOUNTER — Encounter: Payer: Self-pay | Admitting: Gastroenterology

## 2014-12-14 ENCOUNTER — Ambulatory Visit: Payer: 59 | Admitting: Pulmonary Disease

## 2015-01-26 ENCOUNTER — Other Ambulatory Visit: Payer: Self-pay | Admitting: *Deleted

## 2015-01-26 MED ORDER — METHYLPHENIDATE HCL ER (CD) 20 MG PO CPCR: 20.0000 mg | ORAL_CAPSULE | Freq: Every day | ORAL | Status: DC | PRN

## 2015-01-26 NOTE — Telephone Encounter (Signed)
Pt LMOM on 01/26/15 at 2:22pm  RF request for ritalin LOV: 02/28/14 Next ov: 03/03/15 Last written: 02/28/14 #30 w/ 0RF  Please advise. Thanks.

## 2015-01-27 ENCOUNTER — Other Ambulatory Visit (INDEPENDENT_AMBULATORY_CARE_PROVIDER_SITE_OTHER): Payer: 59

## 2015-01-27 ENCOUNTER — Encounter: Payer: Self-pay | Admitting: Family Medicine

## 2015-01-27 ENCOUNTER — Ambulatory Visit (INDEPENDENT_AMBULATORY_CARE_PROVIDER_SITE_OTHER): Payer: 59 | Admitting: Family Medicine

## 2015-01-27 VITALS — BP 136/78 | HR 90 | Ht 70.0 in | Wt 265.0 lb

## 2015-01-27 DIAGNOSIS — M25522 Pain in left elbow: Secondary | ICD-10-CM

## 2015-01-27 DIAGNOSIS — M7712 Lateral epicondylitis, left elbow: Secondary | ICD-10-CM

## 2015-01-27 NOTE — Assessment & Plan Note (Signed)
Lateral Epicondylitis: Elbow anatomy was reviewed, and tendinopathy was explained.  Pt. given a formal rehab program. Series of concentric and eccentric exercises should be done starting with no weight, work up to 1 lb, hammer, etc.  Use counterforce strap if working or using hands.  Formal PT would be beneficial. Emphasized stretching an cross-friction massage Emphasized proper palms up lifting biomechanics to unload ECRB Topical anti-inflammatories given, icing protocol, wrist immobilizer return in 3-4 weeks.

## 2015-01-27 NOTE — Patient Instructions (Addendum)
Great to see you it has been a long time.  Wear brace day and night for next week then nightly for 2 weeks.  Ice 20 minutes 2 times daily. Usually after activity and before bed. Exercises 3 times a week.  pennsaid pinkie amount topically 2 times daily as needed.   See me againi in 3 weeks and we may consider injection or patches.

## 2015-01-27 NOTE — Progress Notes (Signed)
Pre visit review using our clinic review tool, if applicable. No additional management support is needed unless otherwise documented below in the visit note. 

## 2015-01-27 NOTE — Progress Notes (Addendum)
[facility] [address], [place] [address] Phone: [phone] Subjective:    I'm seeing this patient by the request  of:  MCGOWEN,PHILIP H, MD   CC: Left elbow pain  [id number] Richard [name] is a 59 y.o. male coming in with complaint of left elbow pain. Patient states that this is more of an insidious onset. Starting to occur over the course of time. Does not remember any true injury. Patient rates this pain is 8 out of 10. Worse with certain movements. Denies any weakness or numbness. Seems to be localized over the lateral aspect of the elbow. No swelling. No bruising. Not waking him up at night but starting to affect his daily activities.  Past Medical History  Diagnosis Date  . Allergic rhinitis   . Seborrheic dermatitis of scalp     and face: hydrocort 2% cream bid prn to face, and fluocinonide 0.5% sol'n to scalp bid prn per Dr. Allyson Sabal (04/2014)  . Obesity   . OSA on CPAP     Severe.  Wears CPAP but pt does not know his setting.  (Dr. Melvyn Novas follows him)  . Anxiety     claustrophobic  . Colon polyps 2012 and 2013    All hyperplastic except one adenomatous w/out high grade dysplasia (recall 2023 per Dr. Deatra Ina)  . Adult ADHD   . Hyperlipidemia    Past Surgical History  Procedure Laterality Date  . Mouth surgery    . Colonoscopy w/ polypectomy  04/2010; 10/2011    Initial screening TCS showed 6 polyps (one adenomatous).  Pt says he returned for repeat TCS 10/2011 showed one hyperplastic polyp: Recall 10 yrs per Dr. Deatra Ina   Social History  Substance Use Topics  . Smoking status: Former Smoker -- 1.00 packs/day    Types: Cigarettes    Quit date: 07/17/2002  . Smokeless tobacco: Never Used  . Alcohol Use: 3.5 oz/week    7 drink(s) per week     Comment: wine every night with dinner   No Known Allergies Family History  Problem Relation Age of Onset  . Lung cancer Father   . Cancer Father     nasal  . Allergies Father   . Colon  cancer Neg Hx   . Esophageal cancer Neg Hx   . Rectal cancer Neg Hx   . Stomach cancer Neg Hx         Past medical history, social, surgical and family history all reviewed in electronic medical record.   Review of Systems: No headache, visual changes, nausea, vomiting, diarrhea, constipation, dizziness, abdominal pain, skin rash, fevers, chills, night sweats, weight loss, swollen lymph nodes, body aches, joint swelling, muscle aches, chest pain, shortness of breath, mood changes.   Objective Blood pressure 136/78, pulse 90, height 5\' 10"  (1.778 m), weight 265 lb (120.203 kg), SpO2 95 %.  General: No apparent distress alert and oriented x3 mood and affect normal, dressed appropriately.  HEENT: Pupils equal, extraocular movements intact  Respiratory: Patient's speak in full sentences and does not appear short of breath  Cardiovascular: No lower extremity edema, non tender, no erythema  Skin: Warm dry intact with no signs of infection or rash on extremities or on axial skeleton.  Abdomen: Soft nontender  Neuro: Cranial nerves II through XII are intact, neurovascularly intact in all extremities with 2+ DTRs and 2+ pulses.  Lymph: No lymphadenopathy of posterior or anterior cervical chain or axillae bilaterally.  Gait normal with good  balance and coordination.  MSK:  Non tender with full range of motion and good stability and symmetric strength and tone of shoulders,  wrist, hip, knee and ankles bilaterally.  Elbow: Left Unremarkable to inspection. Range of motion full pronation, supination, flexion, extension. Strength is full to all of the above directions Stable to varus, valgus stress. Negative moving valgus stress test. Tender to palpation over the lateral epicondylar region and patient does have pain when trying to flex the extensor common tendon against resistance. Ulnar nerve does not sublux. Negative cubital tunnel Tinel's. Contralateral elbow unremarkable  Musculoskeletal  ultrasound was performed and interpreted by Charlann Boxer D.O.   Elbow:  Lateral epicondyle and common extensor tendon origin visualized.  Partial tear noted. Patient also has what seems to be chronic changes of the bone at the insertion with calcific changes. Mild increase in hypoechoic changes but no increase in Doppler flow. Radial head unremarkable and located in annular ligament Medial epicondyle and common flexor tendon origin visualized.  No edema, effusions, or avulsions seen. Ulnar nerve in cubital tunnel unremarkable. Olecranon and triceps insertion visualized and unremarkable without edema, effusion, or avulsion.  No signs olecranon bursitis. Images saved in hard drive.   IMPRESSION:  Lateral epicondylitis with very small tear of the extensor common tendon.        Impression and Recommendations:     This case required medical decision making of moderate complexity.

## 2015-02-17 ENCOUNTER — Encounter: Payer: Self-pay | Admitting: Family Medicine

## 2015-02-17 ENCOUNTER — Ambulatory Visit (INDEPENDENT_AMBULATORY_CARE_PROVIDER_SITE_OTHER): Payer: 59 | Admitting: Family Medicine

## 2015-02-17 VITALS — BP 138/80 | HR 99 | Ht 70.0 in | Wt 268.0 lb

## 2015-02-17 DIAGNOSIS — M7712 Lateral epicondylitis, left elbow: Secondary | ICD-10-CM | POA: Diagnosis not present

## 2015-02-17 NOTE — Progress Notes (Signed)
[facility] [address] [place], [place] [address] Phone: [phone] Subjective:    I'm seeing this patient by the request  of:  MCGOWEN,PHILIP H, MD   CC: Left elbow pain  [id number] Richard [name] is a 59 y.o. male coming in with complaint of left elbow pain. Patient states that this is more of an insidious onset. Patient had lateral epicondylitis. Patient was to do home exercises, bracing, icing protocol, as well as topical anti-inflammatory. Patient states he is 85% better. Still some minimal discomfort. Nothing that his stopping him from his daily activities. Has not changed his ergonomics  Past Medical History  Diagnosis Date  . Allergic rhinitis   . Seborrheic dermatitis of scalp     and face: hydrocort 2% cream bid prn to face, and fluocinonide 0.5% sol'n to scalp bid prn per Dr. Allyson Sabal (04/2014)  . Obesity   . OSA on CPAP     Severe.  Wears CPAP but pt does not know his setting.  (Dr. Melvyn Novas follows him)  . Anxiety     claustrophobic  . Colon polyps 2012 and 2013    All hyperplastic except one adenomatous w/out high grade dysplasia (recall 2023 per Dr. Deatra Ina)  . Adult ADHD   . Hyperlipidemia    Past Surgical History  Procedure Laterality Date  . Mouth surgery    . Colonoscopy w/ polypectomy  04/2010; 10/2011    Initial screening TCS showed 6 polyps (one adenomatous).  Pt says he returned for repeat TCS 10/2011 showed one hyperplastic polyp: Recall 10 yrs per Dr. Deatra Ina   Social History  Substance Use Topics  . Smoking status: Former Smoker -- 1.00 packs/day    Types: Cigarettes    Quit date: 07/17/2002  . Smokeless tobacco: Never Used  . Alcohol Use: 3.5 oz/week    7 drink(s) per week     Comment: wine every night with dinner   No Known Allergies Family History  Problem Relation Age of Onset  . Lung cancer Father   . Cancer Father     nasal  . Allergies Father   . Colon cancer Neg Hx   . Esophageal cancer Neg Hx   . Rectal  cancer Neg Hx   . Stomach cancer Neg Hx         Past medical history, social, surgical and family history all reviewed in electronic medical record.   Review of Systems: No headache, visual changes, nausea, vomiting, diarrhea, constipation, dizziness, abdominal pain, skin rash, fevers, chills, night sweats, weight loss, swollen lymph nodes, body aches, joint swelling, muscle aches, chest pain, shortness of breath, mood changes.   Objective Blood pressure 138/80, pulse 99, height 5\' 10"  (1.778 m), weight 268 lb (121.564 kg), SpO2 94 %.  General: No apparent distress alert and oriented x3 mood and affect normal, dressed appropriately.  HEENT: Pupils equal, extraocular movements intact  Respiratory: Patient's speak in full sentences and does not appear short of breath  Cardiovascular: No lower extremity edema, non tender, no erythema  Skin: Warm dry intact with no signs of infection or rash on extremities or on axial skeleton.  Abdomen: Soft nontender  Neuro: Cranial nerves II through XII are intact, neurovascularly intact in all extremities with 2+ DTRs and 2+ pulses.  Lymph: No lymphadenopathy of posterior or anterior cervical chain or axillae bilaterally.  Gait normal with good balance and coordination.  MSK:  Non tender with full range of motion and good stability and symmetric  strength and tone of shoulders,  wrist, hip, knee and ankles bilaterally.  Elbow: Left Unremarkable to inspection. Range of motion full pronation, supination, flexion, extension. Strength is full to all of the above directions Stable to varus, valgus stress. Negative moving valgus stress test. Minimal tenderness to palpation over the lateral epicondylar region. Improved from previous exam. common tendon against resistance. Ulnar nerve does not sublux. Negative cubital tunnel Tinel's. Contralateral elbow unremarkable         Impression and Recommendations:     This case required medical decision  making of moderate complexity.

## 2015-02-17 NOTE — Assessment & Plan Note (Signed)
Making improvements overall. We discussed icing regimen and continuing the bracing at night for the next 2-4 weeks. Patient has been trying to continue to do the exercises fairly regularly. We discussed the ergonomics with him being on the computer. Patient will come back and see me again in 6 weeks if not completely resolved.

## 2015-02-17 NOTE — Progress Notes (Signed)
Pre visit review using our clinic review tool, if applicable. No additional management support is needed unless otherwise documented below in the visit note. 

## 2015-02-17 NOTE — Patient Instructions (Signed)
Verbal instructions given

## 2015-02-27 ENCOUNTER — Other Ambulatory Visit (INDEPENDENT_AMBULATORY_CARE_PROVIDER_SITE_OTHER): Payer: 59

## 2015-02-27 DIAGNOSIS — Z125 Encounter for screening for malignant neoplasm of prostate: Secondary | ICD-10-CM

## 2015-02-27 DIAGNOSIS — Z0000 Encounter for general adult medical examination without abnormal findings: Secondary | ICD-10-CM | POA: Diagnosis not present

## 2015-02-27 LAB — COMPREHENSIVE METABOLIC PANEL
ALT: 49 U/L (ref 0–53)
AST: 47 U/L — ABNORMAL HIGH (ref 0–37)
Albumin: 4.5 g/dL (ref 3.5–5.2)
Alkaline Phosphatase: 76 U/L (ref 39–117)
BUN: 10 mg/dL (ref 6–23)
CHLORIDE: 98 meq/L (ref 96–112)
CO2: 28 meq/L (ref 19–32)
Calcium: 10 mg/dL (ref 8.4–10.5)
Creatinine, Ser: 0.68 mg/dL (ref 0.40–1.50)
GFR: 126.9 mL/min (ref >60.00)
GLUCOSE: 117 mg/dL — AB (ref 70–99)
POTASSIUM: 4.1 meq/L (ref 3.5–5.1)
SODIUM: 136 meq/L (ref 135–145)
Total Bilirubin: 0.6 mg/dL (ref 0.2–1.2)
Total Protein: 7.2 g/dL (ref 6.0–8.3)

## 2015-02-27 LAB — CBC WITH DIFFERENTIAL/PLATELET
Basophils Absolute: 0.1 10*3/uL (ref 0.0–0.1)
Basophils Relative: 0.6 % (ref 0.0–3.0)
EOS PCT: 2 % (ref 0.0–5.0)
Eosinophils Absolute: 0.2 10*3/uL (ref 0.0–0.7)
HCT: 47.3 % (ref 39.0–52.0)
Hemoglobin: 16 g/dL (ref 13.0–17.0)
Lymphocytes Relative: 21.4 % (ref 12.0–46.0)
Lymphs Abs: 2.1 10*3/uL (ref 0.7–4.0)
MCHC: 33.8 g/dL (ref 30.0–36.0)
MCV: 104.9 fl — AB (ref 78.0–100.0)
MONOS PCT: 9.2 % (ref 3.0–12.0)
Monocytes Absolute: 0.9 10*3/uL (ref 0.1–1.0)
NEUTROS ABS: 6.5 10*3/uL (ref 1.4–7.7)
Neutrophils Relative %: 66.8 % (ref 43.0–77.0)
PLATELETS: 229 10*3/uL (ref 150.0–400.0)
RBC: 4.51 Mil/uL (ref 4.22–5.81)
RDW: 14.3 % (ref 11.5–15.5)
WBC: 9.8 10*3/uL (ref 4.0–10.5)

## 2015-02-27 LAB — LIPID PANEL
CHOLESTEROL: 208 mg/dL — AB (ref 0–200)
HDL: 69.4 mg/dL (ref >39.00)
LDL CALC: 121 mg/dL — AB (ref 0–99)
NonHDL: 138.8
TRIGLYCERIDES: 88 mg/dL (ref 0.0–149.0)
Total CHOL/HDL Ratio: 3
VLDL: 17.6 mg/dL (ref 0.0–40.0)

## 2015-02-28 LAB — PSA: PSA: 0.34 ng/mL (ref 0.10–4.00)

## 2015-02-28 LAB — TSH: TSH: 1.58 u[IU]/mL (ref 0.35–4.50)

## 2015-03-02 ENCOUNTER — Other Ambulatory Visit (INDEPENDENT_AMBULATORY_CARE_PROVIDER_SITE_OTHER): Payer: 59

## 2015-03-02 ENCOUNTER — Other Ambulatory Visit: Payer: Self-pay | Admitting: *Deleted

## 2015-03-02 DIAGNOSIS — R7301 Impaired fasting glucose: Secondary | ICD-10-CM

## 2015-03-02 LAB — HEMOGLOBIN A1C: Hgb A1c MFr Bld: 6.2 % (ref 4.6–6.5)

## 2015-03-03 ENCOUNTER — Ambulatory Visit (INDEPENDENT_AMBULATORY_CARE_PROVIDER_SITE_OTHER): Payer: 59 | Admitting: Family Medicine

## 2015-03-03 ENCOUNTER — Encounter: Payer: Self-pay | Admitting: Family Medicine

## 2015-03-03 VITALS — BP 150/90 | HR 107 | Temp 98.5°F | Resp 16 | Ht 69.5 in | Wt 265.5 lb

## 2015-03-03 DIAGNOSIS — Z0000 Encounter for general adult medical examination without abnormal findings: Secondary | ICD-10-CM

## 2015-03-03 DIAGNOSIS — Z125 Encounter for screening for malignant neoplasm of prostate: Secondary | ICD-10-CM

## 2015-03-03 DIAGNOSIS — R7303 Prediabetes: Secondary | ICD-10-CM

## 2015-03-03 DIAGNOSIS — S39012D Strain of muscle, fascia and tendon of lower back, subsequent encounter: Secondary | ICD-10-CM

## 2015-03-03 DIAGNOSIS — Z0001 Encounter for general adult medical examination with abnormal findings: Secondary | ICD-10-CM

## 2015-03-03 DIAGNOSIS — R030 Elevated blood-pressure reading, without diagnosis of hypertension: Secondary | ICD-10-CM

## 2015-03-03 NOTE — Progress Notes (Signed)
Office Note 03/04/2015  CC:  Chief Complaint  Patient presents with  . Annual Exam    Labs done on 02/27/15    HPI:  Richard [name] is a 59 y.o. White male who is here for annual health maintenance exam. Recent fasting labs reviewed in detail (see below, most significant was fasting gluc of 117 and HbA1c of 6.2%). He does not diet and does not exercise any.   Hx of recurrent musculoskeletal LBP, most recent episode lasting from 04/2014 until now, happened after he fell down stairs awkwardly and broke a toe and hurt his foot.  Since then his LB and L hip have hurt. Saw ortho/sports med and was told he had LB strain.  He has not done any PT.  He takes ibup 600mg  1-2 time a day.    He has hx of elevated bp on some occasions in MD offices.  Has never monitored bp at home.  Currently the only med he takes is metadate CD on a prn basis.   Past Medical History  Diagnosis Date  . Allergic rhinitis   . Seborrheic dermatitis of scalp     and face: hydrocort 2% cream bid prn to face, and fluocinonide 0.5% sol'n to scalp bid prn per Dr. Allyson Sabal (04/2014)  . Obesity   . OSA on CPAP     Severe.  Wears CPAP but pt does not know his setting.  (Dr. Melvyn Novas follows him)  . Anxiety     claustrophobic  . Colon polyps 2012 and 2013    All hyperplastic except one adenomatous w/out high grade dysplasia (recall 2023 per Dr. Deatra Ina)  . Adult ADHD   . Hyperlipidemia     Past Surgical History  Procedure Laterality Date  . Mouth surgery    . Colonoscopy w/ polypectomy  04/2010; 10/2011    Initial screening TCS showed 6 polyps (one adenomatous).  Pt says he returned for repeat TCS 10/2011 showed one hyperplastic polyp: Recall 10 yrs per Dr. Deatra Ina    Family History  Problem Relation Age of Onset  . Lung cancer Father   . Cancer Father     nasal  . Allergies Father   . Colon cancer Neg Hx   . Esophageal cancer Neg Hx   . Rectal cancer Neg Hx   . Stomach cancer Neg Hx     Social History    Social History  . Marital Status: Married    Spouse Name: N/A  . Number of Children: N/A  . Years of Education: N/A   Occupational History  . Counselling psychologist    Social History Main Topics  . Smoking status: Former Smoker -- 1.00 packs/day    Types: Cigarettes    Quit date: 07/17/2002  . Smokeless tobacco: Never Used  . Alcohol Use: 3.5 oz/week    7 drink(s) per week     Comment: wine every night with dinner  . Drug Use: No  . Sexual Activity: Not on file   Other Topics Concern  . Not on file   Social History Narrative   Married, 1 child--grown.   Occupation: Financial planner"   Tobacco: 20 pack-yr hx; quit 2004.   Alcohol: nightly cocktail   Drugs: none   Exercise: "walk some when I can".   Diet: normal.        Pt typically drinks 2 glasses of wine per night  MED: methylphenidate CD 20mg  qd prn  No Known Allergies  ROS Review of Systems  Constitutional: Negative for  fever, chills, appetite change and fatigue.  HENT: Negative for congestion, dental problem, ear pain and sore throat.   Eyes: Negative for discharge, redness and visual disturbance.  Respiratory: Negative for cough, chest tightness, shortness of breath and wheezing.   Cardiovascular: Negative for chest pain, palpitations and leg swelling.  Gastrointestinal: Negative for nausea, vomiting, abdominal pain, diarrhea and blood in stool.  Genitourinary: Negative for dysuria, urgency, frequency, hematuria, flank pain and difficulty urinating.  Musculoskeletal: Positive for back pain and arthralgias (left hip/glut). Negative for myalgias, joint swelling and neck stiffness.  Skin: Negative for pallor and rash.  Neurological: Negative for dizziness, speech difficulty, weakness and headaches.  Hematological: Negative for adenopathy. Does not bruise/bleed easily.  Psychiatric/Behavioral: Negative for confusion and sleep disturbance. The patient is not nervous/anxious.     PE; Blood pressure 150/90, pulse  107, temperature 98.5 F (36.9 C), temperature source Oral, resp. rate 16, height 5' 9.5" (1.765 m), weight 265 lb 8 oz (120.43 kg), SpO2 94 %. BMI 38.6  Repeat bp 150/94 Gen: Alert, well appearing, obese-appearing WM in NAD.  Patient is oriented to person, place, time, and situation. AFFECT: pleasant, lucid thought and speech. ENT: Ears: EACs clear, normal epithelium.  TMs with good light reflex and landmarks bilaterally.  Eyes: no injection, icteris, swelling, or exudate.  EOMI, PERRLA. Nose: no drainage or turbinate edema/swelling.  No injection or focal lesion.  Mouth: lips without lesion/swelling.  Oral mucosa pink and moist.  Dentition intact and without obvious caries or gingival swelling.  Oropharynx without erythema, exudate, or swelling.  Neck: supple/nontender.  No LAD, mass, or TM.  Carotid pulses 2+ bilaterally, without bruits. CV: RRR, no m/r/g.   LUNGS: CTA bilat, nonlabored resps, good aeration in all lung fields. ABD: soft, NT, rotund but ND, BS normal.  No hepatospenomegaly or mass.  No bruits. EXT: no clubbing, cyanosis, or edema.  Musculoskeletal: no joint swelling, erythema, warmth, or tenderness.  ROM of all joints intact. Skin - no sores or suspicious lesions or rashes or color changes Rectal exam: negative without mass, lesions or tenderness, PROSTATE EXAM: smooth and symmetric without nodules or tenderness. BACK: pain with all ROM, flexion limited due to pain.  No signif TTP of L/s spine.  Mild discomfort with firm palpation over L greater troch region.  No groin TTP.  NO pain with L hip ER/IR/Flexion/Extension. Sitting SLR neg bilat.  Pertinent labs:  Lab Results  Component Value Date   TSH 1.58 02/27/2015   Lab Results  Component Value Date   WBC 9.8 02/27/2015   HGB 16.0 02/27/2015   HCT 47.3 02/27/2015   MCV 104.9* 02/27/2015   PLT 229.0 02/27/2015   Lab Results  Component Value Date   CREATININE 0.68 02/27/2015   BUN 10 02/27/2015   NA 136 02/27/2015    K 4.1 02/27/2015   CL 98 02/27/2015   CO2 28 02/27/2015  Glucose 02/27/15 was 117  Lab Results  Component Value Date   ALT 49 02/27/2015   AST 47* 02/27/2015   ALKPHOS 76 02/27/2015   BILITOT 0.6 02/27/2015   Lab Results  Component Value Date   CHOL 208* 02/27/2015   Lab Results  Component Value Date   HDL 69.40 02/27/2015   Lab Results  Component Value Date   LDLCALC 121* 02/27/2015   Lab Results  Component Value Date   TRIG 88.0 02/27/2015   Lab Results  Component Value Date   CHOLHDL 3 02/27/2015   Lab Results  Component Value  Date   PSA 0.34 02/27/2015   Lab Results  Component Value Date   HGBA1C 6.2 03/02/2015    ASSESSMENT AND PLAN:   1) IFG/Prediabetes: nutritionist referral discussed.  Will get him into PT for his back/hip and hopefully he can start meaningful exercise in attempts to lose wt soon. Plan on repeating fasting gluc, A1c, and lipid panel in 6 mo.  2) Low back strain, recurrent, with some L SI joint pain.  No sign of L hip arthritis or lumbar radiculopathy: [facility] PT referral ordered.  May continue ibup 600 qd-bid with food.  3) Elevated bp w/out dx of HTN: Buy blood pressure cuff OTC at drug store. Check blood pressure and pulse at least 3 times per week and write these numbers down. Bring numbers to follow up appointment in 6 wks  4) Health maintenance exam: Reviewed age and gender appropriate health maintenance issues (prudent diet, regular exercise, health risks of tobacco and excessive alcohol, use of seatbelts, fire alarms in home, use of sunscreen).  Also reviewed age and gender appropriate health screening as well as vaccine recommendations. He declined flu vaccine today. Next colonoscopy due 2023. DRE normal, PSA normal.  An After Visit Summary was printed and given to the patient.  FOLLOW UP:  Return in about 6 weeks (around 04/14/2015) for 30 min f/u BP.

## 2015-03-03 NOTE — Patient Instructions (Signed)
Buy blood pressure cuff OTC at drug store. Check blood pressure and pulse at least 3 times per week and write these numbers down. Bring numbers to follow up appointment in 6 wks

## 2015-03-03 NOTE — Progress Notes (Signed)
Pre visit review using our clinic review tool, if applicable. No additional management support is needed unless otherwise documented below in the visit note. 

## 2015-03-19 DIAGNOSIS — R7401 Elevation of levels of liver transaminase levels: Secondary | ICD-10-CM

## 2015-03-19 HISTORY — DX: Elevation of levels of liver transaminase levels: R74.01

## 2015-04-05 ENCOUNTER — Encounter: Payer: Self-pay | Admitting: Skilled Nursing Facility1

## 2015-04-05 ENCOUNTER — Encounter: Payer: 59 | Attending: Family Medicine | Admitting: Skilled Nursing Facility1

## 2015-04-05 VITALS — Ht 69.0 in | Wt 265.0 lb

## 2015-04-05 DIAGNOSIS — Z713 Dietary counseling and surveillance: Secondary | ICD-10-CM | POA: Insufficient documentation

## 2015-04-05 DIAGNOSIS — R7303 Prediabetes: Secondary | ICD-10-CM | POA: Diagnosis not present

## 2015-04-05 NOTE — Progress Notes (Signed)
  Medical Nutrition Therapy:  Appt start time: 1600 end time:  1700.   Assessment:  Primary concerns today: referred for prediabetes. Pt states he would like information on prediabetes. Pt states he Works at home. Pt states he naps for three hours and then a couple hours goes to sleep until 9am. Pt states he does not like to take his ADD medications because it "makes him like everyone else", he "likes this mid all over the place". Pt states he has been working with PT for about 3 weeks and has seen a lot of improvement in his back pain.  Pts A1C 6.2.  Preferred Learning Style:   No preference indicated   Learning Readiness:   Not ready  Contemplating   MEDICATIONS: See List   DIETARY INTAKE:  Usual eating pattern includes 3 meals and 0 snacks per day.  Everyday foods include none stated.  Avoided foods include none stated.    24-hr recall:  B ( AM): leftovers Snk ( AM): none L ( PM): sadnwhich Snk ( PM): chips D ( PM): spagetti----bbq chicken and green beans Snk ( PM): none Beverages: water, unsweet tea, wine  Usual physical activity: stretching/balaning exercises   Estimated energy needs: 1800 calories 200 g carbohydrates 135 g protein 50 g fat  Progress Towards Goal(s):  In progress.   Nutritional Diagnosis:  [place]-3.3 Overweight/obesity As related to overconsumption at meal times.  As evidenced by pt report, 24 hr recall, and BMI 39.13.    Intervention:  Nutrition counseling for prediabetes. Dietitian educated the pt on prediabetes/diabetes/A1C, balanced meals, eating throughout the day, and physical activity.   Goals: -Try to go for a walk three days a week -Be sure your meal is a meal: carbohydrate, vegetables, meat -Set an alarm to remind you to eat when you are on your ADD medication  Teaching Method Utilized:  Visual Auditory  Handouts given during visit include:  Snack sheet  Barriers to learning/adherence to lifestyle change: Attention deficit    Demonstrated degree of understanding via:  Teach Back   Monitoring/Evaluation:  Dietary intake, exercise, A1C, and body weight prn.

## 2015-04-05 NOTE — Patient Instructions (Signed)
-  Try to go for a walk three days a week -Be sure your meal is a meal: carbohydrate, vegetables, meat -Set an alarm to remind you to eat when you are on your ADD medication

## 2015-05-03 ENCOUNTER — Ambulatory Visit (INDEPENDENT_AMBULATORY_CARE_PROVIDER_SITE_OTHER): Payer: 59 | Admitting: Family Medicine

## 2015-05-03 ENCOUNTER — Encounter: Payer: Self-pay | Admitting: Family Medicine

## 2015-05-03 VITALS — BP 156/94 | HR 111 | Temp 99.3°F | Resp 16 | Ht 69.5 in | Wt 264.5 lb

## 2015-05-03 DIAGNOSIS — M545 Low back pain, unspecified: Secondary | ICD-10-CM

## 2015-05-03 DIAGNOSIS — G8929 Other chronic pain: Secondary | ICD-10-CM

## 2015-05-03 DIAGNOSIS — R030 Elevated blood-pressure reading, without diagnosis of hypertension: Secondary | ICD-10-CM | POA: Diagnosis not present

## 2015-05-03 MED ORDER — METHYLPHENIDATE HCL ER (CD) 20 MG PO CPCR: 20.0000 mg | ORAL_CAPSULE | Freq: Every day | ORAL | Status: DC | PRN

## 2015-05-03 NOTE — Progress Notes (Signed)
OFFICE VISIT  05/03/2015   CC:  Chief Complaint  Patient presents with  . Follow-up    BP. Pt is not fasting.      HPI:    Patient is a 60 y.o. Caucasian male who presents for 2 mo f/u HTN, chronic episodic LBP, adult ADHD.  Of note, his back feels much better after his 10 sessions of PT. Saw a nutritionist and feels like he can make the changes necessary to be healthier. Home bp monitoring from 03/23/15 to 05/03/15 shows avg 140 over avg 82, with a few measurements in stage 1 range and only one in stage 2 range.    Adult ADHD: focus/concentration/motivation/behavior good on current dosing of metadate CD, needs RF of this med today. He does not take this med daily, never has.  Past Medical History  Diagnosis Date  . Allergic rhinitis   . Seborrheic dermatitis of scalp     and face: hydrocort 2% cream bid prn to face, and fluocinonide 0.5% sol'n to scalp bid prn per Dr. Allyson Sabal (04/2014)  . Obesity   . OSA on CPAP     Severe.  Wears CPAP but pt does not know his setting.  (Dr. Melvyn Novas follows him)  . Anxiety     claustrophobic  . Colon polyps 2012 and 2013    All hyperplastic except one adenomatous w/out high grade dysplasia (recall 2023 per Dr. Deatra Ina)  . Adult ADHD   . Hyperlipidemia   . Elevated blood pressure reading without diagnosis of hypertension   . IFG (impaired fasting glucose)     repeat A1c 08/2015    Past Surgical History  Procedure Laterality Date  . Mouth surgery    . Colonoscopy w/ polypectomy  04/2010; 10/2011    Initial screening TCS showed 6 polyps (one adenomatous).  Pt says he returned for repeat TCS 10/2011 showed one hyperplastic polyp: Recall 10 yrs per Dr. Deatra Ina    Outpatient Prescriptions Prior to Visit  Medication Sig Dispense Refill  . methylphenidate (METADATE CD) 20 MG CR capsule Take 1 capsule (20 mg total) by mouth daily as needed. 30 capsule 0   No facility-administered medications prior to visit.    No Known Allergies  ROS As per  HPI  PE: Blood pressure 156/94, pulse 111, temperature 99.3 F (37.4 C), temperature source Oral, resp. rate 16, height 5' 9.5" (1.765 m), weight 264 lb 8 oz (119.976 kg), SpO2 95 %. Gen: Alert, well appearing.  Patient is oriented to person, place, time, and situation. AFFECT: pleasant, lucid thought and speech. No further exam today.  LABS:  None today  IMPRESSION AND PLAN:  1) Elev bp w/out dx of HTN: he is definitely borderline, but I want to give him a chance with diet/exercise to manage things, plus this is the way he prefers to proceed as well.  Continue to monitor bp at home, call if avg >140 over>90 persistently.  2) Chronic episodice LBP: much improved s/p PT: continue home stretching/strengthening, work on wt loss.  3) Adult ADHD: The current medical regimen is effective;  continue present plan and medications. I printed rx for metadate CD 20 mg, 1 cap po qd prn today, #30.  Appropriate fill on/after date was noted on each rx.  An After Visit Summary was printed and given to the patient.  FOLLOW UP: Return in about 4 months (around 08/31/2015) for routine chronic illness f/u (30 min)--fasting.

## 2015-05-03 NOTE — Progress Notes (Signed)
Pre visit review using our clinic review tool, if applicable. No additional management support is needed unless otherwise documented below in the visit note. 

## 2015-05-10 ENCOUNTER — Telehealth: Payer: Self-pay | Admitting: Family Medicine

## 2015-05-10 NOTE — Telephone Encounter (Signed)
Pt needs a PA for his Methylphenidate please.

## 2015-05-10 NOTE — Telephone Encounter (Signed)
Working on [place]. Pt is to contact pharmacy and give them his updated insurance information.

## 2015-09-13 ENCOUNTER — Ambulatory Visit: Payer: 59 | Admitting: Family Medicine

## 2015-09-27 ENCOUNTER — Ambulatory Visit (INDEPENDENT_AMBULATORY_CARE_PROVIDER_SITE_OTHER): Payer: 59 | Admitting: Family Medicine

## 2015-09-27 ENCOUNTER — Encounter: Payer: Self-pay | Admitting: Family Medicine

## 2015-09-27 VITALS — BP 148/99 | HR 102 | Temp 99.3°F | Resp 16 | Ht 69.5 in | Wt 262.5 lb

## 2015-09-27 DIAGNOSIS — R7301 Impaired fasting glucose: Secondary | ICD-10-CM | POA: Diagnosis not present

## 2015-09-27 DIAGNOSIS — R030 Elevated blood-pressure reading, without diagnosis of hypertension: Secondary | ICD-10-CM | POA: Diagnosis not present

## 2015-09-27 DIAGNOSIS — F909 Attention-deficit hyperactivity disorder, unspecified type: Secondary | ICD-10-CM

## 2015-09-27 DIAGNOSIS — F900 Attention-deficit hyperactivity disorder, predominantly inattentive type: Secondary | ICD-10-CM

## 2015-09-27 DIAGNOSIS — E785 Hyperlipidemia, unspecified: Secondary | ICD-10-CM

## 2015-09-27 LAB — LIPID PANEL
CHOLESTEROL: 217 mg/dL — AB (ref 0–200)
HDL: 56.9 mg/dL (ref >39.00)
LDL CALC: 129 mg/dL — AB (ref 0–99)
NonHDL: 159.63
TRIGLYCERIDES: 151 mg/dL — AB (ref 0.0–149.0)
Total CHOL/HDL Ratio: 4
VLDL: 30.2 mg/dL (ref 0.0–40.0)

## 2015-09-27 LAB — HEMOGLOBIN A1C: HEMOGLOBIN A1C: 7 % — AB (ref 4.6–6.5)

## 2015-09-27 MED ORDER — METHYLPHENIDATE HCL ER (CD) 20 MG PO CPCR: 20.0000 mg | ORAL_CAPSULE | Freq: Every day | ORAL | Status: DC | PRN

## 2015-09-27 NOTE — Progress Notes (Signed)
OFFICE VISIT  09/27/2015   CC:  Chief Complaint  Patient presents with  . Follow-up    Pt is fasting.    HPI:    Patient is a 60 y.o. Caucasian male who presents for 4 mo f/u hyperlipidemia, borderline HTN, IFG, and adult ADHD. Home bp monitoring shows avg <140/90.   Diet has gotten better, exercising more.  Adult ADHD: doing well with current med--concentrates well, focuses well, low frustration level on it. He uses this sporadically only----as per his usual history with this med.  ROS: no CP, no SOB, no HA's, no dizziness, no blurry vision, no myalgias.   Past Medical History  Diagnosis Date  . Allergic rhinitis   . Seborrheic dermatitis of scalp     and face: hydrocort 2% cream bid prn to face, and fluocinonide 0.5% sol'n to scalp bid prn per Dr. Allyson Sabal (04/2014)  . Obesity   . OSA on CPAP     Severe.  Wears CPAP but pt does not know his setting.  (Dr. Melvyn Novas follows him)  . Anxiety     claustrophobic  . Colon polyps 2012 and 2013    All hyperplastic except one adenomatous w/out high grade dysplasia (recall 2023 per Dr. Deatra Ina)  . Adult ADHD   . Hyperlipidemia   . Elevated blood pressure reading without diagnosis of hypertension   . IFG (impaired fasting glucose)     repeat A1c 08/2015    Past Surgical History  Procedure Laterality Date  . Mouth surgery    . Colonoscopy w/ polypectomy  04/2010; 10/2011    Initial screening TCS showed 6 polyps (one adenomatous).  Pt says he returned for repeat TCS 10/2011 showed one hyperplastic polyp: Recall 10 yrs per Dr. Deatra Ina    Outpatient Prescriptions Prior to Visit  Medication Sig Dispense Refill  . methylphenidate (METADATE CD) 20 MG CR capsule Take 1 capsule (20 mg total) by mouth daily as needed. 30 capsule 0   No facility-administered medications prior to visit.    No Known Allergies  ROS As per HPI  PE: Blood pressure 148/99, pulse 102, temperature 99.3 F (37.4 C), temperature source Oral, resp. rate 16, height  5' 9.5" (1.765 m), weight 262 lb 8 oz (119.069 kg), SpO2 94 %. Gen: Alert, well appearing.  Patient is oriented to person, place, time, and situation. AFFECT: pleasant, lucid thought and speech. CV: RRR, no m/r/g.   LUNGS: CTA bilat, nonlabored resps, good aeration in all lung fields. EXT: no clubbing or cyanosis.  Trace bilater LL pitting edema  LABS:  Lab Results  Component Value Date   TSH 1.58 02/27/2015   Lab Results  Component Value Date   WBC 9.8 02/27/2015   HGB 16.0 02/27/2015   HCT 47.3 02/27/2015   MCV 104.9* 02/27/2015   PLT 229.0 02/27/2015   Lab Results  Component Value Date   CREATININE 0.68 02/27/2015   BUN 10 02/27/2015   NA 136 02/27/2015   K 4.1 02/27/2015   CL 98 02/27/2015   CO2 28 02/27/2015   Lab Results  Component Value Date   ALT 49 02/27/2015   AST 47* 02/27/2015   ALKPHOS 76 02/27/2015   BILITOT 0.6 02/27/2015   Lab Results  Component Value Date   CHOL 208* 02/27/2015   Lab Results  Component Value Date   HDL 69.40 02/27/2015   Lab Results  Component Value Date   LDLCALC 121* 02/27/2015   Lab Results  Component Value Date   TRIG 88.0  02/27/2015   Lab Results  Component Value Date   CHOLHDL 3 02/27/2015   Lab Results  Component Value Date   PSA 0.34 02/27/2015   Lab Results  Component Value Date   HGBA1C 6.2 03/02/2015   IMPRESSION AND PLAN:  1) Hyperlipidemia: he chose TLC over meds. Recheck lipid panel today.  2) IFG: he has seen nutritionist, has picked up a bit on his TLC. Recheck HbA1c today. He is down 2 lb from last o/v.  3) Borderline HTN: home bp monitoring once per week is showing bp wnl.  His bp is up in stage I HTN range at many of his office visits.  We discussed this and decided to continue with home monitoring, no meds at this time.  4) Adult ADHD: he uses metadate CD on a prn basis.  His most recent rx was done 04/2015.  I gave him a rx for this medication today, #30.  An After Visit Summary was  printed and given to the patient.  FOLLOW UP: Return in about 6 months (around 03/29/2016) for annual CPE (fasting).  Signed:  Crissie Sickles, MD           09/27/2015

## 2015-09-27 NOTE — Progress Notes (Signed)
Pre visit review using our clinic review tool, if applicable. No additional management support is needed unless otherwise documented below in the visit note. 

## 2015-09-28 ENCOUNTER — Encounter: Payer: Self-pay | Admitting: Family Medicine

## 2015-09-28 ENCOUNTER — Other Ambulatory Visit: Payer: Self-pay | Admitting: Family Medicine

## 2015-09-28 MED ORDER — METFORMIN HCL 500 MG PO TABS: 500.0000 mg | ORAL_TABLET | Freq: Two times a day (BID) | ORAL | Status: DC

## 2015-09-29 ENCOUNTER — Telehealth: Payer: Self-pay | Admitting: Family Medicine

## 2015-09-29 NOTE — Telephone Encounter (Addendum)
Patient called he went [facility] to pick up methylphenidate. They no longer have the medication. Please advise

## 2015-09-29 NOTE — Telephone Encounter (Signed)
Spoke to John at [facility] OR and he stated that the brand Metadate which pts insurance prefers has been d/c by the manufacture. He stated that they can fill Rx for generic methylphenidate but they will have to contact pts insurance to get override. John stated that pt will need to bring back the Rx for them to do this. Pt advised and voiced understanding.

## 2016-01-03 ENCOUNTER — Encounter: Payer: Self-pay | Admitting: Family Medicine

## 2016-01-03 ENCOUNTER — Ambulatory Visit (INDEPENDENT_AMBULATORY_CARE_PROVIDER_SITE_OTHER): Payer: 59 | Admitting: Family Medicine

## 2016-01-03 VITALS — BP 145/92 | HR 102 | Temp 98.2°F | Resp 16 | Wt 254.0 lb

## 2016-01-03 DIAGNOSIS — F909 Attention-deficit hyperactivity disorder, unspecified type: Secondary | ICD-10-CM | POA: Diagnosis not present

## 2016-01-03 DIAGNOSIS — Z23 Encounter for immunization: Secondary | ICD-10-CM

## 2016-01-03 DIAGNOSIS — E7800 Pure hypercholesterolemia, unspecified: Secondary | ICD-10-CM | POA: Diagnosis not present

## 2016-01-03 DIAGNOSIS — E119 Type 2 diabetes mellitus without complications: Secondary | ICD-10-CM

## 2016-01-03 LAB — LIPID PANEL
CHOLESTEROL: 219 mg/dL — AB (ref 0–200)
HDL: 63.4 mg/dL (ref >39.00)
LDL CALC: 132 mg/dL — AB (ref 0–99)
NonHDL: 156.07
TRIGLYCERIDES: 122 mg/dL (ref 0.0–149.0)
Total CHOL/HDL Ratio: 3
VLDL: 24.4 mg/dL (ref 0.0–40.0)

## 2016-01-03 LAB — HEMOGLOBIN A1C: Hgb A1c MFr Bld: 6.2 % (ref 4.6–6.5)

## 2016-01-03 MED ORDER — METFORMIN HCL 500 MG PO TABS: 500.0000 mg | ORAL_TABLET | Freq: Two times a day (BID) | ORAL | 6 refills | Status: DC

## 2016-01-03 MED ORDER — METHYLPHENIDATE HCL ER (CD) 20 MG PO CPCR: 20.0000 mg | ORAL_CAPSULE | Freq: Every day | ORAL | 0 refills | Status: DC | PRN

## 2016-01-03 NOTE — Progress Notes (Signed)
OFFICE VISIT  01/03/2016   CC:  Chief Complaint  Patient presents with  . Follow-up    labs   HPI:    Patient is a 60 y.o. Caucasian male who presents for f/u DM 2, HLD, adult ADHD. New dx DM 2 last visit 3 mo ago, started metformin.   Tolerating metformin fine.  Has lost 8 lbs.  He is walking a lot more but definitely has changed his diet.  Fasting today.  No new complaints. We discussed routine diabetic testing throughout every year: A1c, feet exam, urine microalb/cr, and diabetic retinopathy screening exam.  I discussed his elevated cholesterol and said I would be touching base with him periodically to start a med for this: statin.  He is not open to this med at this time.  Doing well on metadate CD 20mg  qd, takes it infrequently--"just when I need to behave".  No side effects.  Past Medical History:  Diagnosis Date  . Adult ADHD   . Allergic rhinitis   . Anxiety    claustrophobic  . Colon polyps 2012 and 2013   All hyperplastic except one adenomatous w/out high grade dysplasia (recall 2023 per Dr. Deatra Ina)  . Diabetes mellitus, type 2 ([place]) 09/27/2015   A1c 7.0%   . Elevated blood pressure reading without diagnosis of hypertension   . Hyperlipidemia    No improvement with TLC 2017  . Obesity   . OSA on CPAP    Severe.  Wears CPAP but pt does not know his setting.  (Dr. Melvyn Novas follows him)  . Seborrheic dermatitis of scalp    and face: hydrocort 2% cream bid prn to face, and fluocinonide 0.5% sol'n to scalp bid prn per Dr. Allyson Sabal (04/2014)    Past Surgical History:  Procedure Laterality Date  . COLONOSCOPY W/ POLYPECTOMY  04/2010; 10/2011   Initial screening TCS showed 6 polyps (one adenomatous).  Pt says he returned for repeat TCS 10/2011 showed one hyperplastic polyp: Recall 10 yrs per Dr. Deatra Ina  . MOUTH SURGERY      Outpatient Medications Prior to Visit  Medication Sig Dispense Refill  . metFORMIN (GLUCOPHAGE) 500 MG tablet Take 1 tablet (500 mg total) by mouth 2  (two) times daily with a meal. 60 tablet 3  . methylphenidate (METADATE CD) 20 MG CR capsule Take 1 capsule (20 mg total) by mouth daily as needed. 30 capsule 0   No facility-administered medications prior to visit.     No Known Allergies  ROS As per HPI  PE: Blood pressure (!) 145/92, pulse (!) 102, temperature 98.2 F (36.8 C), temperature source Oral, resp. rate 16, weight 254 lb (115.2 kg), SpO2 96 %. Gen: Alert, well appearing.  Patient is oriented to person, place, time, and situation. AFFECT: pleasant, lucid thought and speech. No further exam today.  LABS:  Lab Results  Component Value Date   HGBA1C 7.0 (H) 09/27/2015    Lab Results  Component Value Date   TSH 1.58 02/27/2015   Lab Results  Component Value Date   WBC 9.8 02/27/2015   HGB 16.0 02/27/2015   HCT 47.3 02/27/2015   MCV 104.9 (H) 02/27/2015   PLT 229.0 02/27/2015   Lab Results  Component Value Date   CREATININE 0.68 02/27/2015   BUN 10 02/27/2015   NA 136 02/27/2015   K 4.1 02/27/2015   CL 98 02/27/2015   CO2 28 02/27/2015   Lab Results  Component Value Date   ALT 49 02/27/2015   AST  47 (H) 02/27/2015   ALKPHOS 76 02/27/2015   BILITOT 0.6 02/27/2015   Lab Results  Component Value Date   CHOL 217 (H) 09/27/2015   Lab Results  Component Value Date   HDL 56.90 09/27/2015   Lab Results  Component Value Date   LDLCALC 129 (H) 09/27/2015   Lab Results  Component Value Date   TRIG 151.0 (H) 09/27/2015   Lab Results  Component Value Date   CHOLHDL 4 09/27/2015   Lab Results  Component Value Date   PSA 0.34 02/27/2015    IMPRESSION AND PLAN:  1) New dx DM 3 mo ago: doing well on metformin and diet/exercise. Continue these.  Recheck HbA1c today.  2) Hyperlipidemia: pt not open to statin trial at this time, but I'll keep harping on this in the future.  Recheck FLP today.  3) ADHD, stable.  The current medical regimen is effective;  continue present plan and medications. I  printed rx's for metadate CD generic 20mg , 1 qd, #30 today.    Flu vaccine today.  An After Visit Summary was printed and given to the patient.  FOLLOW UP: Return for keep appt 03/04/16.  At this appt I'll do feet exam and urine microalb/cr.  Signed:  Crissie Sickles, MD           01/03/2016

## 2016-01-03 NOTE — Addendum Note (Signed)
Addended by: Gordy Councilman on: 01/03/2016 11:55 AM   Modules accepted: Orders

## 2016-01-03 NOTE — Progress Notes (Signed)
Pre visit review using our clinic review tool, if applicable. No additional management support is needed unless otherwise documented below in the visit note. 

## 2016-03-04 ENCOUNTER — Encounter: Payer: Self-pay | Admitting: Family Medicine

## 2016-03-04 ENCOUNTER — Ambulatory Visit (INDEPENDENT_AMBULATORY_CARE_PROVIDER_SITE_OTHER): Payer: 59 | Admitting: Family Medicine

## 2016-03-04 VITALS — BP 168/82 | HR 104 | Temp 98.6°F | Resp 16 | Ht 69.5 in | Wt 258.5 lb

## 2016-03-04 DIAGNOSIS — Z125 Encounter for screening for malignant neoplasm of prostate: Secondary | ICD-10-CM

## 2016-03-04 DIAGNOSIS — Z0000 Encounter for general adult medical examination without abnormal findings: Secondary | ICD-10-CM

## 2016-03-04 DIAGNOSIS — Z23 Encounter for immunization: Secondary | ICD-10-CM

## 2016-03-04 DIAGNOSIS — E119 Type 2 diabetes mellitus without complications: Secondary | ICD-10-CM

## 2016-03-04 LAB — CBC WITH DIFFERENTIAL/PLATELET
BASOS ABS: 0 10*3/uL (ref 0.0–0.1)
Basophils Relative: 0.4 % (ref 0.0–3.0)
Eosinophils Absolute: 0.3 10*3/uL (ref 0.0–0.7)
Eosinophils Relative: 4 % (ref 0.0–5.0)
HEMATOCRIT: 44.4 % (ref 39.0–52.0)
Hemoglobin: 15.3 g/dL (ref 13.0–17.0)
LYMPHS PCT: 20.2 % (ref 12.0–46.0)
Lymphs Abs: 1.6 10*3/uL (ref 0.7–4.0)
MCHC: 34.5 g/dL (ref 30.0–36.0)
MCV: 106.1 fl — AB (ref 78.0–100.0)
MONOS PCT: 9.7 % (ref 3.0–12.0)
Monocytes Absolute: 0.8 10*3/uL (ref 0.1–1.0)
NEUTROS ABS: 5.1 10*3/uL (ref 1.4–7.7)
Neutrophils Relative %: 65.7 % (ref 43.0–77.0)
PLATELETS: 202 10*3/uL (ref 150.0–400.0)
RBC: 4.19 Mil/uL — AB (ref 4.22–5.81)
RDW: 14.4 % (ref 11.5–15.5)
WBC: 7.8 10*3/uL (ref 4.0–10.5)

## 2016-03-04 LAB — COMPREHENSIVE METABOLIC PANEL
ALBUMIN: 4.5 g/dL (ref 3.5–5.2)
ALT: 61 U/L — AB (ref 0–53)
AST: 55 U/L — AB (ref 0–37)
Alkaline Phosphatase: 79 U/L (ref 39–117)
BILIRUBIN TOTAL: 0.6 mg/dL (ref 0.2–1.2)
BUN: 13 mg/dL (ref 6–23)
CALCIUM: 9.6 mg/dL (ref 8.4–10.5)
CO2: 24 mEq/L (ref 19–32)
CREATININE: 0.8 mg/dL (ref 0.40–1.50)
Chloride: 102 mEq/L (ref 96–112)
GFR: 104.83 mL/min (ref >60.00)
Glucose, Bld: 124 mg/dL — ABNORMAL HIGH (ref 70–99)
Potassium: 4.2 mEq/L (ref 3.5–5.1)
Sodium: 138 mEq/L (ref 135–145)
Total Protein: 7.1 g/dL (ref 6.0–8.3)

## 2016-03-04 LAB — TSH: TSH: 1.78 u[IU]/mL (ref 0.35–4.50)

## 2016-03-04 LAB — MICROALBUMIN / CREATININE URINE RATIO
Creatinine,U: 171.7 mg/dL
Microalb Creat Ratio: 5.9 mg/g (ref 0.0–30.0)
Microalb, Ur: 10.2 mg/dL — ABNORMAL HIGH (ref 0.0–1.9)

## 2016-03-04 LAB — PSA: PSA: 0.54 ng/mL (ref 0.10–4.00)

## 2016-03-04 MED ORDER — METHYLPHENIDATE HCL ER (CD) 20 MG PO CPCR: 20.0000 mg | ORAL_CAPSULE | Freq: Every day | ORAL | 0 refills | Status: DC | PRN

## 2016-03-04 NOTE — Progress Notes (Signed)
Pre visit review using our clinic review tool, if applicable. No additional management support is needed unless otherwise documented below in the visit note. 

## 2016-03-04 NOTE — Progress Notes (Signed)
Office Note 03/04/2016  CC:  Chief Complaint  Patient presents with  . Annual Exam    Pt is not fasting, pt did have some labs done in October 2017.     HPI:  Richard [name] is a 60 y.o. White male who is here for annual health maintenance exam. Last visit 2 mo ago his lipids were elevated and I recommended a statin.  He said he wanted to think about it and discuss or call with his decision later. Home bp monitoring usually normal per pt. He is upset about something today and says this is why his bp is up.  No signif exerc, not focusing on anything in particular from dietary standpoint.  Past Medical History:  Diagnosis Date  . Adult ADHD   . Allergic rhinitis   . Anxiety    claustrophobic  . Colon polyps 2012 and 2013   All hyperplastic except one adenomatous w/out high grade dysplasia (recall 2023 per Dr. Deatra Ina)  . Diabetes mellitus, type 2 ([place]) 09/27/2015   A1c 7.0%   . Elevated blood pressure reading without diagnosis of hypertension   . Hyperlipidemia    No improvement with TLC 2017  . Obesity   . OSA on CPAP    Severe.  Wears CPAP but pt does not know his setting.  (Dr. Melvyn Novas follows him)  . Seborrheic dermatitis of scalp    and face: hydrocort 2% cream bid prn to face, and fluocinonide 0.5% sol'n to scalp bid prn per Dr. Allyson Sabal (04/2014)    Past Surgical History:  Procedure Laterality Date  . COLONOSCOPY W/ POLYPECTOMY  04/2010; 10/2011   Initial screening TCS showed 6 polyps (one adenomatous).  Pt says he returned for repeat TCS 10/2011 showed one hyperplastic polyp: Recall 10 yrs per Dr. Deatra Ina  . MOUTH SURGERY      Family History  Problem Relation Age of Onset  . Lung cancer Father   . Cancer Father     nasal  . Allergies Father   . Colon cancer Neg Hx   . Esophageal cancer Neg Hx   . Rectal cancer Neg Hx   . Stomach cancer Neg Hx     Social History   Social History  . Marital status: Married    Spouse name: N/A  . Number of children: N/A  .  Years of education: N/A   Occupational History  . Counselling psychologist    Social History Main Topics  . Smoking status: Former Smoker    Packs/day: 1.00    Types: Cigarettes    Quit date: 07/17/2002  . Smokeless tobacco: Never Used  . Alcohol use 3.5 oz/week    7 Standard drinks or equivalent per week     Comment: wine every night with dinner  . Drug use: No  . Sexual activity: Not on file   Other Topics Concern  . Not on file   Social History Narrative   Married, 1 child--grown.   Occupation: Financial planner"   Tobacco: 20 pack-yr hx; quit 2004.   Alcohol: nightly cocktail   Drugs: none   Exercise: "walk some when I can".   Diet: normal.          Outpatient Medications Prior to Visit  Medication Sig Dispense Refill  . metFORMIN (GLUCOPHAGE) 500 MG tablet Take 1 tablet (500 mg total) by mouth 2 (two) times daily with a meal. 60 tablet 6  . methylphenidate (METADATE CD) 20 MG CR capsule Take 1 capsule (20 mg total) by  mouth daily as needed. 30 capsule 0   No facility-administered medications prior to visit.     No Known Allergies  ROS Review of Systems  Constitutional: Negative for appetite change, chills, fatigue and fever.  HENT: Negative for congestion, dental problem, ear pain and sore throat.   Eyes: Negative for discharge, redness and visual disturbance.  Respiratory: Negative for cough, chest tightness, shortness of breath and wheezing.   Cardiovascular: Negative for chest pain, palpitations and leg swelling.  Gastrointestinal: Negative for abdominal pain, blood in stool, diarrhea, nausea and vomiting.  Genitourinary: Negative for difficulty urinating, dysuria, flank pain, frequency, hematuria and urgency.  Musculoskeletal: Negative for arthralgias, back pain, joint swelling, myalgias and neck stiffness.  Skin: Negative for pallor and rash.  Neurological: Negative for dizziness, speech difficulty, weakness and headaches.  Hematological: Negative for  adenopathy. Does not bruise/bleed easily.  Psychiatric/Behavioral: Negative for confusion and sleep disturbance. The patient is not nervous/anxious.     PE; Blood pressure (!) 168/82, pulse (!) 104, temperature 98.6 F (37 C), temperature source Oral, resp. rate 16, height 5' 9.5" (1.765 m), weight 258 lb 8 oz (117.3 kg), SpO2 95 %. Gen: Alert, well appearing.  Patient is oriented to person, place, time, and situation. AFFECT: pleasant, lucid thought and speech. ENT: Ears: EACs clear, normal epithelium.  TMs with good light reflex and landmarks bilaterally.  Eyes: no injection, icteris, swelling, or exudate.  EOMI, PERRLA. Nose: no drainage or turbinate edema/swelling.  No injection or focal lesion.  Mouth: lips without lesion/swelling.  Oral mucosa pink and moist.  Dentition intact and without obvious caries or gingival swelling.  Oropharynx without erythema, exudate, or swelling.  Neck: supple/nontender.  No LAD, mass, or TM.  Carotid pulses 2+ bilaterally, without bruits. CV: RRR, no m/r/g.   LUNGS: CTA bilat, nonlabored resps, good aeration in all lung fields. ABD: soft, NT, ND, BS normal.  No hepatospenomegaly or mass.  No bruits. EXT: no clubbing, cyanosis, or edema.  Musculoskeletal: no joint swelling, erythema, warmth, or tenderness.  ROM of all joints intact. Skin - no sores or suspicious lesions or rashes or color changes Rectal exam: negative without mass, lesions or tenderness, PROSTATE EXAM: smooth and symmetric without nodules or tenderness.   Pertinent labs:  Lab Results  Component Value Date   TSH 1.58 02/27/2015   Lab Results  Component Value Date   WBC 9.8 02/27/2015   HGB 16.0 02/27/2015   HCT 47.3 02/27/2015   MCV 104.9 (H) 02/27/2015   PLT 229.0 02/27/2015   Lab Results  Component Value Date   CREATININE 0.68 02/27/2015   BUN 10 02/27/2015   NA 136 02/27/2015   K 4.1 02/27/2015   CL 98 02/27/2015   CO2 28 02/27/2015   Lab Results  Component Value Date    ALT 49 02/27/2015   AST 47 (H) 02/27/2015   ALKPHOS 76 02/27/2015   BILITOT 0.6 02/27/2015   Lab Results  Component Value Date   CHOL 219 (H) 01/03/2016   Lab Results  Component Value Date   HDL 63.40 01/03/2016   Lab Results  Component Value Date   LDLCALC 132 (H) 01/03/2016   Lab Results  Component Value Date   TRIG 122.0 01/03/2016   Lab Results  Component Value Date   CHOLHDL 3 01/03/2016   Lab Results  Component Value Date   PSA 0.34 02/27/2015   Lab Results  Component Value Date   HGBA1C 6.2 01/03/2016    ASSESSMENT AND PLAN:  Health maintenance exam: Reviewed age and gender appropriate health maintenance issues (prudent diet, regular exercise, health risks of tobacco and excessive alcohol, use of seatbelts, fire alarms in home, use of sunscreen).  Also reviewed age and gender appropriate health screening as well as vaccine recommendations. Pneumovax 23 given today.  Flu UTD. HP labs (minus the lipid panel--pt is not fasting) drawn. Prostate ca screening: DRE normal today, PSA drawn. Colon cancer screening: next colonoscopy due 10/2021 ([facility]).  Regarding pt's hyperlipidemia, he still needs time to consider my recommendation of starting a statin.  An After Visit Summary was printed and given to the patient.  FOLLOW UP:  Return in about 3 months (around 06/02/2016) for routine chronic illness f/u.  Signed:  Crissie Sickles, MD           03/04/2016

## 2016-03-05 ENCOUNTER — Other Ambulatory Visit: Payer: 59

## 2016-03-05 ENCOUNTER — Other Ambulatory Visit: Payer: Self-pay | Admitting: Family Medicine

## 2016-03-05 DIAGNOSIS — R7401 Elevation of levels of liver transaminase levels: Secondary | ICD-10-CM

## 2016-03-05 DIAGNOSIS — R740 Nonspecific elevation of levels of transaminase and lactic acid dehydrogenase [LDH]: Principal | ICD-10-CM

## 2016-03-06 ENCOUNTER — Encounter: Payer: Self-pay | Admitting: *Deleted

## 2016-03-06 LAB — HEPATITIS C ANTIBODY: HCV AB: NEGATIVE

## 2016-03-06 LAB — HEPATITIS B SURFACE ANTIGEN: Hepatitis B Surface Ag: NEGATIVE

## 2016-06-03 ENCOUNTER — Ambulatory Visit: Payer: 59 | Admitting: Family Medicine

## 2016-06-12 ENCOUNTER — Encounter: Payer: Self-pay | Admitting: Family Medicine

## 2016-06-12 ENCOUNTER — Ambulatory Visit (INDEPENDENT_AMBULATORY_CARE_PROVIDER_SITE_OTHER): Payer: 59 | Admitting: Family Medicine

## 2016-06-12 VITALS — BP 149/90 | HR 85 | Temp 99.3°F | Resp 16 | Ht 69.5 in | Wt 257.8 lb

## 2016-06-12 DIAGNOSIS — E7800 Pure hypercholesterolemia, unspecified: Secondary | ICD-10-CM | POA: Diagnosis not present

## 2016-06-12 DIAGNOSIS — E119 Type 2 diabetes mellitus without complications: Secondary | ICD-10-CM | POA: Diagnosis not present

## 2016-06-12 DIAGNOSIS — F909 Attention-deficit hyperactivity disorder, unspecified type: Secondary | ICD-10-CM | POA: Diagnosis not present

## 2016-06-12 LAB — POCT GLYCOSYLATED HEMOGLOBIN (HGB A1C): HEMOGLOBIN A1C: 6

## 2016-06-12 MED ORDER — ATORVASTATIN CALCIUM 40 MG PO TABS
40.0000 mg | ORAL_TABLET | Freq: Every day | ORAL | 6 refills | Status: DC
Start: 2016-06-12 — End: 2017-01-05

## 2016-06-12 MED ORDER — METHYLPHENIDATE HCL ER (CD) 20 MG PO CPCR: 20.0000 mg | ORAL_CAPSULE | Freq: Every day | ORAL | 0 refills | Status: DC | PRN

## 2016-06-12 NOTE — Progress Notes (Signed)
OFFICE VISIT  06/12/2016   CC:  Chief Complaint  Patient presents with  . Follow-up    RCI, pt is not fasting.   HPI:    Patient is a 61 y.o. Caucasian male who presents for 3 mo f/u adult ADHD, DM 2, hyperlipidemia. Lipids have been elevated, did not improve with TLC.  I recommended statin back in 12/2015 and he has repeatedly declined this and wanted to continue to think med option over.  ADHD: metadate CD 20mg  helping sufficiently with focus/concentration/motivation when he needs it. He uses it fairly infrequently---for certain situations.  DM: no home glucose monitoring.  Diabetic diet--doing well.  Exercise: very little.  Cholest: discussed this today, benefits in people with DM regardless of cholesterol levels.   Past Medical History:  Diagnosis Date  . Adult ADHD   . Allergic rhinitis   . Anxiety    claustrophobic  . Colon polyps 2012 and 2013   All hyperplastic except one adenomatous w/out high grade dysplasia (recall 2023 per Dr. Deatra Ina)  . Diabetes mellitus, type 2 ([place]) 09/27/2015   A1c 7.0%   . Elevated blood pressure reading without diagnosis of hypertension   . Elevated transaminase level 2017   Viral hep screening neg.  Suspect fatty liver.  . Hyperlipidemia    No improvement with TLC 2017  . Obesity   . OSA on CPAP    Severe.  Wears CPAP but pt does not know his setting.  (Dr. Melvyn Novas follows him)  . Seborrheic dermatitis of scalp    and face: hydrocort 2% cream bid prn to face, and fluocinonide 0.5% sol'n to scalp bid prn per Dr. Allyson Sabal (04/2014)    Past Surgical History:  Procedure Laterality Date  . COLONOSCOPY W/ POLYPECTOMY  04/2010; 10/2011   Initial screening TCS showed 6 polyps (one adenomatous).  Pt says he returned for repeat TCS 10/2011 showed one hyperplastic polyp: Recall 10 yrs per Dr. Deatra Ina  . MOUTH SURGERY      Outpatient Medications Prior to Visit  Medication Sig Dispense Refill  . metFORMIN (GLUCOPHAGE) 500 MG tablet Take 1 tablet (500  mg total) by mouth 2 (two) times daily with a meal. 60 tablet 6  . methylphenidate (METADATE CD) 20 MG CR capsule Take 1 capsule (20 mg total) by mouth daily as needed. 30 capsule 0   No facility-administered medications prior to visit.     No Known Allergies  ROS As per HPI  PE: Blood pressure (!) 149/90, pulse 85, temperature 99.3 F (37.4 C), temperature source Oral, resp. rate 16, height 5' 9.5" (1.765 m), weight 257 lb 12 oz (116.9 kg), SpO2 93 %. Wt Readings from Last 2 Encounters:  06/12/16 257 lb 12 oz (116.9 kg)  03/04/16 258 lb 8 oz (117.3 kg)    Gen: alert, oriented x 4, affect pleasant.  Lucid thinking and conversation noted. HEENT: PERRLA, EOMI.   Neck: no LAD, mass, or thyromegaly. CV: RRR, no m/r/g LUNGS: CTA bilat, nonlabored. NEURO: no tremor or tics noted on observation.  Coordination intact. CN 2-12 grossly intact bilaterally, strength 5/5 in all extremeties.  No ataxia.   LABS:  Lab Results  Component Value Date   TSH 1.78 03/04/2016   Lab Results  Component Value Date   WBC 7.8 03/04/2016   HGB 15.3 03/04/2016   HCT 44.4 03/04/2016   MCV 106.1 (H) 03/04/2016   PLT 202.0 03/04/2016   Lab Results  Component Value Date   CREATININE 0.80 03/04/2016   BUN  13 03/04/2016   NA 138 03/04/2016   K 4.2 03/04/2016   CL 102 03/04/2016   CO2 24 03/04/2016   Lab Results  Component Value Date   ALT 61 (H) 03/04/2016   AST 55 (H) 03/04/2016   ALKPHOS 79 03/04/2016   BILITOT 0.6 03/04/2016   Lab Results  Component Value Date   CHOL 219 (H) 01/03/2016   Lab Results  Component Value Date   HDL 63.40 01/03/2016   Lab Results  Component Value Date   LDLCALC 132 (H) 01/03/2016   Lab Results  Component Value Date   TRIG 122.0 01/03/2016   Lab Results  Component Value Date   CHOLHDL 3 01/03/2016   Lab Results  Component Value Date   PSA 0.54 03/04/2016   PSA 0.34 02/27/2015   Lab Results  Component Value Date   HGBA1C 6.2 01/03/2016    POC HbA1c today = 6.0 %  IMPRESSION AND PLAN:  1) DM 2; relatively recent dx: control has been good on metformin 500mg  bid. Hba1c today: 6.0 %.  Excellent.  No changes in med. Eye exam: needs to get diabetic retinopathy screening exam.  Says last eye exam was 18 mo ago.  2) Adult ADHD: The current medical regimen is effective;  continue present plan and medications. Metadate CD 20mg , 1 qd prn, #30 given today.  3) Hyperlipidemia in a diabetic: discussed the fact that statins decrease CV risk in pt's with DM 2 even if their cholesterol is normal.  He agreed to trial of atorvastatin 40mg  once daily today. Therapeutic expectations and side effect profile of medication discussed today.  Patient's questions answered.  An After Visit Summary was printed and given to the patient.  FOLLOW UP: Return in about 6 months (around 12/13/2016) for annual CPE (fasting).  Signed:  Crissie Sickles, MD           06/12/2016

## 2016-06-12 NOTE — Progress Notes (Signed)
Pre visit review using our clinic review tool, if applicable. No additional management support is needed unless otherwise documented below in the visit note. 

## 2016-09-06 ENCOUNTER — Other Ambulatory Visit: Payer: Self-pay

## 2016-09-06 MED ORDER — METFORMIN HCL 500 MG PO TABS: 500.0000 mg | ORAL_TABLET | Freq: Two times a day (BID) | ORAL | 3 refills | Status: DC

## 2016-09-06 NOTE — Telephone Encounter (Signed)
Refill sent on Metformin.

## 2016-11-26 ENCOUNTER — Encounter: Payer: Self-pay | Admitting: Family Medicine

## 2016-11-26 ENCOUNTER — Ambulatory Visit (INDEPENDENT_AMBULATORY_CARE_PROVIDER_SITE_OTHER): Payer: 59 | Admitting: Family Medicine

## 2016-11-26 VITALS — BP 128/74 | HR 101 | Temp 98.7°F | Resp 16 | Ht 69.5 in | Wt 257.5 lb

## 2016-11-26 DIAGNOSIS — J069 Acute upper respiratory infection, unspecified: Secondary | ICD-10-CM | POA: Diagnosis not present

## 2016-11-26 DIAGNOSIS — J209 Acute bronchitis, unspecified: Secondary | ICD-10-CM

## 2016-11-26 MED ORDER — PREDNISONE 20 MG PO TABS: ORAL_TABLET | ORAL | 0 refills | Status: DC

## 2016-11-26 MED ORDER — HYDROCODONE-HOMATROPINE 5-1.5 MG/5ML PO SYRP: ORAL_SOLUTION | ORAL | 0 refills | Status: DC

## 2016-11-26 NOTE — Progress Notes (Signed)
OFFICE VISIT  11/26/2016   CC:  Chief Complaint  Patient presents with  . URI  . Cough   HPI:    Patient is a 61 y.o. Caucasian male who presents for respiratory complaints. Onset 4 d/a, started with cough.  A little nasal cong and runny nose.  Slight ST and HA (yesterday). No fever.  Flew back from [facility] yesterday. No SOB or wheezing but hears a "rattle" when coughs  Worse cough when lying supine.  No face pain or upper teeth pain. Cough productive of thickish white mucous. Alka seltzer cold not helping much.  Eating and drinking ok.  Wife with similar, milder resp sx's.  Past Medical History:  Diagnosis Date  . Adult ADHD   . Allergic rhinitis   . Anxiety    claustrophobic  . Colon polyps 2012 and 2013   All hyperplastic except one adenomatous w/out high grade dysplasia (recall 2023 per Dr. Deatra Ina)  . Diabetes mellitus, type 2 ([place]) 09/27/2015   A1c 7.0%   . Elevated blood pressure reading without diagnosis of hypertension   . Elevated transaminase level 2017   Viral hep screening neg.  Suspect fatty liver.  . Hyperlipidemia    No improvement with TLC 2017  . Obesity   . OSA on CPAP    Severe.  Wears CPAP but pt does not know his setting.  (Dr. Melvyn Novas follows him)  . Seborrheic dermatitis of scalp    and face: hydrocort 2% cream bid prn to face, and fluocinonide 0.5% sol'n to scalp bid prn per Dr. Allyson Sabal (04/2014)    Past Surgical History:  Procedure Laterality Date  . COLONOSCOPY W/ POLYPECTOMY  04/2010; 10/2011   Initial screening TCS showed 6 polyps (one adenomatous).  Pt says he returned for repeat TCS 10/2011 showed one hyperplastic polyp: Recall 10 yrs per Dr. Deatra Ina  . MOUTH SURGERY     Social History   Social History  . Marital status: Married    Spouse name: N/A  . Number of children: N/A  . Years of education: N/A   Occupational History  . Counselling psychologist    Social History Main Topics  . Smoking status: Former Smoker    Packs/day: 1.00   Types: Cigarettes    Quit date: 07/17/2002  . Smokeless tobacco: Never Used  . Alcohol use 3.5 oz/week    7 Standard drinks or equivalent per week     Comment: wine every night with dinner  . Drug use: No  . Sexual activity: Not on file   Other Topics Concern  . Not on file   Social History Narrative   Married, 1 child--grown.   Occupation: Financial planner"   Tobacco: 20 pack-yr hx; quit 2004.   Alcohol: nightly cocktail   Drugs: none   Exercise: "walk some when I can".   Diet: normal.          Outpatient Medications Prior to Visit  Medication Sig Dispense Refill  . atorvastatin (LIPITOR) 40 MG tablet Take 1 tablet (40 mg total) by mouth daily. 30 tablet 6  . metFORMIN (GLUCOPHAGE) 500 MG tablet Take 1 tablet (500 mg total) by mouth 2 (two) times daily with a meal. 60 tablet 3  . methylphenidate (METADATE CD) 20 MG CR capsule Take 1 capsule (20 mg total) by mouth daily as needed. 30 capsule 0   No facility-administered medications prior to visit.     No Known Allergies  ROS As per HPI  PE: Blood pressure 128/74, pulse [name] Kitchen)  101, temperature 98.7 F (37.1 C), temperature source Oral, resp. rate 16, height 5' 9.5" (1.765 m), weight 257 lb 8 oz (116.8 kg), SpO2 96 %. VS: noted--normal. Gen: alert, NAD, NONTOXIC APPEARING. HEENT: eyes without injection, drainage, or swelling.  Ears: EACs clear, TMs with normal light reflex and landmarks.  Nose: Clear rhinorrhea, with some dried, crusty exudate adherent to injected and edematous mucosa.  No purulent d/c.  No paranasal sinus TTP.  No facial swelling.  Throat and mouth without focal lesion.  No pharyngial swelling but mild diffuse soft palate and pharyngeal erythema.  No exudate.   Neck: supple, no LAD.   LUNGS: CTA bilat, nonlabored resps.  Exp phase not prolonged. CV: RRR, no m/r/g. EXT: no c/c/e SKIN: no rash   LABS:    Chemistry      Component Value Date/Time   NA 138 03/04/2016 1334   K 4.2 03/04/2016 1334   CL 102  03/04/2016 1334   CO2 24 03/04/2016 1334   BUN 13 03/04/2016 1334   CREATININE 0.80 03/04/2016 1334      Component Value Date/Time   CALCIUM 9.6 03/04/2016 1334   ALKPHOS 79 03/04/2016 1334   AST 55 (H) 03/04/2016 1334   ALT 61 (H) 03/04/2016 1334   BILITOT 0.6 03/04/2016 1334       IMPRESSION AND PLAN:  1) Viral URI with acute bronchitis. Prednisone taper: 40x 5, 20 x 5, 10 x 4. Hycodan syrup: 1-2 tsp bid prn cough, #180 ml. Therapeutic expectations and side effect profile of medications discussed today.  Patient's questions answered. Push fluids, rest. Signs/symptoms to call or return for were reviewed and pt expressed understanding.  FOLLOW UP: Return if symptoms worsen (temp> 100.5 or shortness of breath) or fail to improve in 7d.  Signed:  Crissie Sickles, MD           11/26/2016

## 2016-12-02 ENCOUNTER — Telehealth: Payer: Self-pay | Admitting: Family Medicine

## 2016-12-02 ENCOUNTER — Ambulatory Visit (INDEPENDENT_AMBULATORY_CARE_PROVIDER_SITE_OTHER): Payer: 59 | Admitting: Family Medicine

## 2016-12-02 ENCOUNTER — Encounter: Payer: Self-pay | Admitting: Family Medicine

## 2016-12-02 VITALS — BP 149/78 | HR 98 | Temp 98.8°F | Resp 16 | Ht 69.5 in | Wt 256.5 lb

## 2016-12-02 DIAGNOSIS — J180 Bronchopneumonia, unspecified organism: Secondary | ICD-10-CM

## 2016-12-02 DIAGNOSIS — J069 Acute upper respiratory infection, unspecified: Secondary | ICD-10-CM

## 2016-12-02 MED ORDER — ALBUTEROL SULFATE HFA 108 (90 BASE) MCG/ACT IN AERS: 1.0000 | INHALATION_SPRAY | RESPIRATORY_TRACT | 0 refills | Status: DC | PRN

## 2016-12-02 MED ORDER — METHYLPREDNISOLONE ACETATE 80 MG/ML IJ SUSP
80.0000 mg | Freq: Once | INTRAMUSCULAR | Status: AC
  Administered 2016-12-02: 80 mg via INTRAMUSCULAR

## 2016-12-02 MED ORDER — CEFDINIR 300 MG PO CAPS: 300.0000 mg | ORAL_CAPSULE | Freq: Two times a day (BID) | ORAL | 0 refills | Status: DC

## 2016-12-02 NOTE — Telephone Encounter (Signed)
Patient states that he is not able to get rid of cough since he saw you.  Patient states cough is keeping him awake at night.  Please advise.

## 2016-12-02 NOTE — Addendum Note (Signed)
Addended by: Gordy Councilman on: 12/02/2016 01:55 PM   Modules accepted: Orders

## 2016-12-02 NOTE — Patient Instructions (Signed)
Get otc generic robitussin DM OR Mucinex DM and use as directed on the packaging for cough and congestion. Use otc generic saline nasal spray 2-3 times per day to irrigate/moisturize your nasal passages.  Finish your prednisone as prescribed on the bottle.

## 2016-12-02 NOTE — Telephone Encounter (Signed)
SW pt and advised him that Dr. Anitra Lauth wanted to see him back in the office if he was not improving in 7 days. Apt made for today at 1:15pm.

## 2016-12-02 NOTE — Progress Notes (Signed)
OFFICE VISIT  12/02/2016   CC:  Chief Complaint  Patient presents with  . Cough   HPI:    Patient is a 61 y.o. Caucasian male who presents for ongoing respiratory symptoms. I saw him 11/26/16 for a 4d hx of rattly cough and dx'd him with acute viral URI with bronchitis, rx'd prednisone taper and hycodan cough syrup. He improved some but cough is still very bothersome, esp in morning and hs. No fevers.  Cough productive of whitish, thick sputum, sometimes copious.  Rattling still described.  Feels like lungs "filled up with gunk".  ROS: no CP, no dizziness, no HA, no ST, no HA.   Past Medical History:  Diagnosis Date  . Adult ADHD   . Allergic rhinitis   . Anxiety    claustrophobic  . Colon polyps 2012 and 2013   All hyperplastic except one adenomatous w/out high grade dysplasia (recall 2023 per Dr. Deatra Ina)  . Diabetes mellitus, type 2 ([place]) 09/27/2015   A1c 7.0%   . Elevated blood pressure reading without diagnosis of hypertension   . Elevated transaminase level 2017   Viral hep screening neg.  Suspect fatty liver.  . Hyperlipidemia    No improvement with TLC 2017  . Obesity   . OSA on CPAP    Severe.  Wears CPAP but pt does not know his setting.  (Dr. Melvyn Novas follows him)  . Seborrheic dermatitis of scalp    and face: hydrocort 2% cream bid prn to face, and fluocinonide 0.5% sol'n to scalp bid prn per Dr. Allyson Sabal (04/2014)    Past Surgical History:  Procedure Laterality Date  . COLONOSCOPY W/ POLYPECTOMY  04/2010; 10/2011   Initial screening TCS showed 6 polyps (one adenomatous).  Pt says he returned for repeat TCS 10/2011 showed one hyperplastic polyp: Recall 10 yrs per Dr. Deatra Ina  . MOUTH SURGERY      Outpatient Medications Prior to Visit  Medication Sig Dispense Refill  . atorvastatin (LIPITOR) 40 MG tablet Take 1 tablet (40 mg total) by mouth daily. 30 tablet 6  . HYDROcodone-homatropine (HYCODAN) 5-1.5 MG/5ML syrup 1-2 tsp po bid as needed for cough 180 mL 0  .  metFORMIN (GLUCOPHAGE) 500 MG tablet Take 1 tablet (500 mg total) by mouth 2 (two) times daily with a meal. 60 tablet 3  . methylphenidate (METADATE CD) 20 MG CR capsule Take 1 capsule (20 mg total) by mouth daily as needed. 30 capsule 0  . predniSONE (DELTASONE) 20 MG tablet 2 tabs po qd x 5d, then 1 tab po qd x 5d, then 1/2 tab po qd x 4d 17 tablet 0   No facility-administered medications prior to visit.     No Known Allergies  ROS As per HPI  PE: Blood pressure (!) 149/78, pulse 98, temperature 98.8 F (37.1 C), temperature source Oral, resp. rate 16, height 5' 9.5" (1.765 m), weight 256 lb 8 oz (116.3 kg), SpO2 95 %. VS: noted--normal. Gen: alert, NAD, NONTOXIC APPEARING. HEENT: eyes without injection, drainage, or swelling.  Ears: EACs clear, TMs with normal light reflex and landmarks.  Nose: Clear rhinorrhea, with some dried, crusty exudate adherent to mildly injected mucosa.  No purulent d/c.  No paranasal sinus TTP.  No facial swelling.  Throat and mouth without focal lesion.  No pharyngial swelling, erythema, or exudate.   Neck: supple, no LAD.   LUNGS: CTA bilat except for trace, coarse end exp wheeze that partially clears with coughing, nonlabored resps.  Mildly prolonged exp  phase with forced exp maneuver.  Lots of coughing with foreced expiration.  CV: RRR, no m/r/g. EXT: no c/c/e SKIN: no rash   LABS:    Chemistry      Component Value Date/Time   NA 138 03/04/2016 1334   K 4.2 03/04/2016 1334   CL 102 03/04/2016 1334   CO2 24 03/04/2016 1334   BUN 13 03/04/2016 1334   CREATININE 0.80 03/04/2016 1334      Component Value Date/Time   CALCIUM 9.6 03/04/2016 1334   ALKPHOS 79 03/04/2016 1334   AST 55 (H) 03/04/2016 1334   ALT 61 (H) 03/04/2016 1334   BILITOT 0.6 03/04/2016 1334       IMPRESSION AND PLAN:  1) URI improving but acute bronchitis not--suspect possible bacterial bronchopneumonia. Depo medrol 80mg  IM in office today. Cefdinir 300 mg bid x  10d. Albuterol HFA, 1-2 p q4h prn. Get otc generic robitussin DM OR Mucinex DM and use as directed on the packaging for cough and congestion. Use otc generic saline nasal spray 2-3 times per day to irrigate/moisturize your nasal passages.  An After Visit Summary was printed and given to the patient.  FOLLOW UP: Return if symptoms worsen or fail to improve.  Signed:  Crissie Sickles, MD           12/02/2016

## 2016-12-12 ENCOUNTER — Ambulatory Visit (INDEPENDENT_AMBULATORY_CARE_PROVIDER_SITE_OTHER): Payer: 59 | Admitting: Family Medicine

## 2016-12-12 ENCOUNTER — Encounter: Payer: Self-pay | Admitting: Family Medicine

## 2016-12-12 VITALS — BP 132/80 | HR 86 | Temp 98.6°F | Resp 16 | Ht 69.5 in | Wt 249.5 lb

## 2016-12-12 DIAGNOSIS — E7800 Pure hypercholesterolemia, unspecified: Secondary | ICD-10-CM | POA: Diagnosis not present

## 2016-12-12 DIAGNOSIS — J180 Bronchopneumonia, unspecified organism: Secondary | ICD-10-CM

## 2016-12-12 DIAGNOSIS — R740 Nonspecific elevation of levels of transaminase and lactic acid dehydrogenase [LDH]: Secondary | ICD-10-CM

## 2016-12-12 DIAGNOSIS — E119 Type 2 diabetes mellitus without complications: Secondary | ICD-10-CM

## 2016-12-12 DIAGNOSIS — R7401 Elevation of levels of liver transaminase levels: Secondary | ICD-10-CM

## 2016-12-12 LAB — COMPREHENSIVE METABOLIC PANEL
ALK PHOS: 92 U/L (ref 39–117)
ALT: 42 U/L (ref 0–53)
AST: 31 U/L (ref 0–37)
Albumin: 4.2 g/dL (ref 3.5–5.2)
BUN: 10 mg/dL (ref 6–23)
CO2: 26 mEq/L (ref 19–32)
Calcium: 9.5 mg/dL (ref 8.4–10.5)
Chloride: 102 mEq/L (ref 96–112)
Creatinine, Ser: 0.58 mg/dL (ref 0.40–1.50)
GFR: 151.54 mL/min (ref >60.00)
GLUCOSE: 132 mg/dL — AB (ref 70–99)
POTASSIUM: 4.3 meq/L (ref 3.5–5.1)
SODIUM: 138 meq/L (ref 135–145)
TOTAL PROTEIN: 6.8 g/dL (ref 6.0–8.3)
Total Bilirubin: 0.5 mg/dL (ref 0.2–1.2)

## 2016-12-12 LAB — LIPID PANEL
Cholesterol: 130 mg/dL (ref 0–200)
HDL: 65.5 mg/dL (ref >39.00)
LDL Cholesterol: 48 mg/dL (ref 0–99)
NONHDL: 64.18
Total CHOL/HDL Ratio: 2
Triglycerides: 79 mg/dL (ref 0.0–149.0)
VLDL: 15.8 mg/dL (ref 0.0–40.0)

## 2016-12-12 LAB — HEMOGLOBIN A1C: HEMOGLOBIN A1C: 6.6 % — AB (ref 4.6–6.5)

## 2016-12-12 MED ORDER — METHYLPHENIDATE HCL ER (CD) 20 MG PO CPCR: 20.0000 mg | ORAL_CAPSULE | Freq: Every day | ORAL | 0 refills | Status: DC | PRN

## 2016-12-12 NOTE — Progress Notes (Signed)
OFFICE VISIT  12/12/2016   CC:  Chief Complaint  Patient presents with  . Follow-up    RCI, pt is fasting.     HPI:    Patient is a 61 y.o. Caucasian male who presents for DM 2, HLD, hx of mildly elevated transaminases, adult ADD. Last f/u 05/2016 he agreed to a trial of atorvastatin. Feeling much better regarding resp sx's.  "I was able to give a 4 hour presentation without coughing".   DM: eats fairly good diet.  No polyuria or polydipsia.   Still is overdue for his diab retpthy eye exam. No signif exercise.  HLD: tolerating atorva well.  Elev trans: Hep B and Hep C screening neg 2017 (prior to starting statin).  Suspect dx of fatty liver.  Adult ADD: doing well, still using this only prn/infrequently.  He says he takes this only when he has to "behave himself".  ROS: no CP, no myalgias or arthalgias, no melena or hematochezia, no SOB/wheeze, no fevers.  No tremor, palpitations, or HA's.     Past Medical History:  Diagnosis Date  . Adult ADHD   . Allergic rhinitis   . Anxiety    claustrophobic  . Colon polyps 2012 and 2013   All hyperplastic except one adenomatous w/out high grade dysplasia (recall 2023 per Dr. Deatra Ina)  . Diabetes mellitus, type 2 ([place]) 09/27/2015   A1c 7.0%   . Elevated blood pressure reading without diagnosis of hypertension   . Elevated transaminase level 2017   Viral hep screening neg.  Suspect fatty liver.  . Hyperlipidemia    No improvement with TLC 2017  . Obesity   . OSA on CPAP    Severe.  Wears CPAP but pt does not know his setting.  (Dr. Melvyn Novas follows him)  . Seborrheic dermatitis of scalp    and face: hydrocort 2% cream bid prn to face, and fluocinonide 0.5% sol'n to scalp bid prn per Dr. Allyson Sabal (04/2014)    Past Surgical History:  Procedure Laterality Date  . COLONOSCOPY W/ POLYPECTOMY  04/2010; 10/2011   Initial screening TCS showed 6 polyps (one adenomatous).  Pt says he returned for repeat TCS 10/2011 showed one hyperplastic polyp:  Recall 10 yrs per Dr. Deatra Ina  . MOUTH SURGERY      Outpatient Medications Prior to Visit  Medication Sig Dispense Refill  . albuterol (VENTOLIN HFA) 108 (90 Base) MCG/ACT inhaler Inhale 1-2 puffs into the lungs every 4 (four) hours as needed for wheezing or shortness of breath. 1 Inhaler 0  . atorvastatin (LIPITOR) 40 MG tablet Take 1 tablet (40 mg total) by mouth daily. 30 tablet 6  . metFORMIN (GLUCOPHAGE) 500 MG tablet Take 1 tablet (500 mg total) by mouth 2 (two) times daily with a meal. 60 tablet 3  . cefdinir (OMNICEF) 300 MG capsule Take 1 capsule (300 mg total) by mouth 2 (two) times daily. 20 capsule 0  . methylphenidate (METADATE CD) 20 MG CR capsule Take 1 capsule (20 mg total) by mouth daily as needed. 30 capsule 0  . HYDROcodone-homatropine (HYCODAN) 5-1.5 MG/5ML syrup 1-2 tsp po bid as needed for cough (Patient not taking: Reported on 12/12/2016) 180 mL 0  . predniSONE (DELTASONE) 20 MG tablet 2 tabs po qd x 5d, then 1 tab po qd x 5d, then 1/2 tab po qd x 4d (Patient not taking: Reported on 12/12/2016) 17 tablet 0   No facility-administered medications prior to visit.     No Known Allergies  ROS  As per HPI  PE: Blood pressure 132/80, pulse 86, temperature 98.6 F (37 C), temperature source Oral, resp. rate 16, height 5' 9.5" (1.765 m), weight 249 lb 8 oz (113.2 kg), SpO2 95 %. Wt Readings from Last 2 Encounters:  12/12/16 249 lb 8 oz (113.2 kg)  12/02/16 256 lb 8 oz (116.3 kg)    Gen: alert, oriented x 4, affect pleasant.  Lucid thinking and conversation noted. HEENT: PERRLA, EOMI.   Neck: no LAD, mass, or thyromegaly. CV: RRR, no m/r/g LUNGS: CTA bilat, nonlabored. NEURO: no tremor or tics noted on observation.  Coordination intact. CN 2-12 grossly intact bilaterally, strength 5/5 in all extremeties.  No ataxia.   LABS:    Chemistry      Component Value Date/Time   NA 138 03/04/2016 1334   K 4.2 03/04/2016 1334   CL 102 03/04/2016 1334   CO2 24 03/04/2016  1334   BUN 13 03/04/2016 1334   CREATININE 0.80 03/04/2016 1334      Component Value Date/Time   CALCIUM 9.6 03/04/2016 1334   ALKPHOS 79 03/04/2016 1334   AST 55 (H) 03/04/2016 1334   ALT 61 (H) 03/04/2016 1334   BILITOT 0.6 03/04/2016 1334     Lab Results  Component Value Date   WBC 7.8 03/04/2016   HGB 15.3 03/04/2016   HCT 44.4 03/04/2016   MCV 106.1 (H) 03/04/2016   PLT 202.0 03/04/2016   Lab Results  Component Value Date   TSH 1.78 03/04/2016   Lab Results  Component Value Date   CHOL 219 (H) 01/03/2016   HDL 63.40 01/03/2016   LDLCALC 132 (H) 01/03/2016   LDLDIRECT 139.9 06/13/2011   TRIG 122.0 01/03/2016   CHOLHDL 3 01/03/2016   Lab Results  Component Value Date   HGBA1C 6.0 06/12/2016   IMPRESSION AND PLAN:  1) Acute bronchopneumonia: MUCH improved.  Finish out prednisone taper, finish abx.  2) DM 2, hx of good control on metformin 500 mg bid. Reminded pt again of need for diab retpthy screening exam. HbA1c today. BMET today.  3) HLD: tolerating atorvastatin well.  Check FLP today.  4) hx of elevated transaminases: mild.  Hep B and C screening neg. Recheck these today.  5) Adult ADD: The current medical regimen is effective;  continue present plan and medications. I printed rx for metadate cd 20mg , 1 qd prn, #30 today.  This generally lasts him 6 mo.    An After Visit Summary was printed and given to the patient.  FOLLOW UP: Return for pt to make CPE appt for 02/2017.  Signed:  Crissie Sickles, MD           12/12/2016

## 2016-12-13 ENCOUNTER — Other Ambulatory Visit: Payer: Self-pay | Admitting: *Deleted

## 2016-12-13 MED ORDER — METFORMIN HCL 1000 MG PO TABS: 1000.0000 mg | ORAL_TABLET | Freq: Two times a day (BID) | ORAL | 6 refills | Status: DC

## 2016-12-20 ENCOUNTER — Telehealth: Payer: Self-pay | Admitting: *Deleted

## 2016-12-20 DIAGNOSIS — G4733 Obstructive sleep apnea (adult) (pediatric): Secondary | ICD-10-CM | POA: Diagnosis not present

## 2016-12-20 NOTE — Telephone Encounter (Signed)
OK to continue the metformin at 500 mg twice daily like he is currently doing. No new med at this time. We'll reconsider adding a new med at next f/u visit if his Hba1c continues to rise.-thx

## 2016-12-20 NOTE — Telephone Encounter (Signed)
Pt called to let Dr. Anitra Lauth know that increased his metformin to 1000mg  BID. He stated that he started having sever abdominal pain/cramps, diarrhea and noticed some blood in his stool. He did say he was taking this medication with food. He stated that he has dropped back down to the 500mg  BID. Please advise. Thanks.

## 2016-12-23 NOTE — Telephone Encounter (Signed)
Patient advised of information, expressed understanding.

## 2017-01-05 ENCOUNTER — Other Ambulatory Visit: Payer: Self-pay | Admitting: Family Medicine

## 2017-01-08 ENCOUNTER — Telehealth: Payer: Self-pay | Admitting: Family Medicine

## 2017-01-08 MED ORDER — METFORMIN HCL 1000 MG PO TABS: 1000.0000 mg | ORAL_TABLET | Freq: Two times a day (BID) | ORAL | 1 refills | Status: DC

## 2017-01-08 NOTE — Telephone Encounter (Signed)
Received fax from [facility] requesting 90 day supply for pts metformin. Rx sent.

## 2017-01-08 NOTE — Telephone Encounter (Signed)
SW John at [facility] and he stated that they did not receive the Rx from 01/06/17. Gave John verbal order to fill Rx atorvastatin. Pt should still have refills for metformin. Tried calling pharmacy back, on hold for 6 minutes had to end call.

## 2017-01-08 NOTE — Telephone Encounter (Signed)
Patient is out of Metformin & [facility] does not have lipitor Rx either. Please send in Rx.

## 2017-01-09 MED ORDER — METFORMIN HCL 500 MG PO TABS: 500.0000 mg | ORAL_TABLET | Freq: Two times a day (BID) | ORAL | 1 refills | Status: DC

## 2017-01-09 NOTE — Addendum Note (Signed)
Addended by: Onalee Hua on: 01/09/2017 08:43 AM   Modules accepted: Orders

## 2017-01-09 NOTE — Telephone Encounter (Signed)
Received fax from [facility] stating that pt told them he is taking Metformin 500mg  BID. Reviewed chart and seen in phone note from 12/20/16 pt reduced metformin to 500mg  because he did not tolerate 1000mg . Okay per Dr. Anitra Lauth. Rx resent for 500mg  BID.

## 2017-02-26 ENCOUNTER — Other Ambulatory Visit: Payer: Self-pay

## 2017-02-26 ENCOUNTER — Encounter: Payer: Self-pay | Admitting: Family Medicine

## 2017-02-26 ENCOUNTER — Ambulatory Visit (INDEPENDENT_AMBULATORY_CARE_PROVIDER_SITE_OTHER): Payer: 59 | Admitting: Family Medicine

## 2017-02-26 VITALS — BP 135/75 | HR 78 | Temp 98.8°F | Resp 16 | Ht 69.5 in | Wt 251.2 lb

## 2017-02-26 DIAGNOSIS — Z23 Encounter for immunization: Secondary | ICD-10-CM | POA: Diagnosis not present

## 2017-02-26 DIAGNOSIS — Z125 Encounter for screening for malignant neoplasm of prostate: Secondary | ICD-10-CM | POA: Diagnosis not present

## 2017-02-26 DIAGNOSIS — E119 Type 2 diabetes mellitus without complications: Secondary | ICD-10-CM

## 2017-02-26 DIAGNOSIS — E7800 Pure hypercholesterolemia, unspecified: Secondary | ICD-10-CM

## 2017-02-26 DIAGNOSIS — Z0000 Encounter for general adult medical examination without abnormal findings: Secondary | ICD-10-CM

## 2017-02-26 LAB — BASIC METABOLIC PANEL
BUN: 12 mg/dL (ref 6–23)
CALCIUM: 9.6 mg/dL (ref 8.4–10.5)
CHLORIDE: 102 meq/L (ref 96–112)
CO2: 25 meq/L (ref 19–32)
CREATININE: 0.59 mg/dL (ref 0.40–1.50)
GFR: 148.48 mL/min (ref >60.00)
Glucose, Bld: 141 mg/dL — ABNORMAL HIGH (ref 70–99)
POTASSIUM: 4.4 meq/L (ref 3.5–5.1)
SODIUM: 138 meq/L (ref 135–145)

## 2017-02-26 LAB — HEMOGLOBIN A1C: HEMOGLOBIN A1C: 6.3 % (ref 4.6–6.5)

## 2017-02-26 LAB — PSA: PSA: 0.4 ng/mL (ref 0.10–4.00)

## 2017-02-26 MED ORDER — METHYLPHENIDATE HCL ER (CD) 20 MG PO CPCR: 20.0000 mg | ORAL_CAPSULE | Freq: Every day | ORAL | 0 refills | Status: DC | PRN

## 2017-02-26 MED ORDER — ZOSTER VAC RECOMB ADJUVANTED 50 MCG/0.5ML IM SUSR: 0.5000 mL | Freq: Once | INTRAMUSCULAR | 1 refills | Status: AC

## 2017-02-26 NOTE — Progress Notes (Signed)
Office Note 02/26/2017  CC:  Chief Complaint  Patient presents with  . Annual Exam    Pt is fasting.    HPI:  Richard [name] is a 61 y.o. White male who is here for annual health maintenance exam. Of note, last f/u visit his HbA1c was up from 6.0 to 6.6% so I recommended he increase metformin to 1000 mg bid. He did not increase his dose as I had intended b/c we recalled he had lots of diarrhea and other GI effects from high doses of metformin.  DM: no home monitoring.   No formal exercise but he is pretty active. Diet: trying to make gradual changes c/w diabetic diet. Eyes: due for diab retin screening exam.   Due for metadate today.  Doing well on this med, only takes it on prn basis, usually 30 tabs lasts about 3 mo. No adverse side effects from the med.   Past Medical History:  Diagnosis Date  . Adult ADHD   . Allergic rhinitis   . Anxiety    claustrophobic  . Colon polyps 2012 and 2013   All hyperplastic except one adenomatous w/out high grade dysplasia (recall 2023 per Dr. Deatra Ina)  . Diabetes mellitus, type 2 ([place]) 09/27/2015   A1c 7.0%   . Elevated blood pressure reading without diagnosis of hypertension   . Elevated transaminase level 2017   Viral hep screening neg.  Suspect fatty liver.  . Hyperlipidemia    No improvement with TLC 2017  . Obesity   . OSA on CPAP    Severe.  Wears CPAP but pt does not know his setting.  (Dr. Melvyn Novas follows him)  . Seborrheic dermatitis of scalp    and face: hydrocort 2% cream bid prn to face, and fluocinonide 0.5% sol'n to scalp bid prn per Dr. Allyson Sabal (04/2014)    Past Surgical History:  Procedure Laterality Date  . COLONOSCOPY W/ POLYPECTOMY  04/2010; 10/2011   Initial screening TCS showed 6 polyps (one adenomatous).  Pt says he returned for repeat TCS 10/2011 showed one hyperplastic polyp: Recall 10 yrs per Dr. Deatra Ina  . MOUTH SURGERY      Family History  Problem Relation Age of Onset  . Lung cancer Father   . Cancer  Father        nasal  . Allergies Father   . Colon cancer Neg Hx   . Esophageal cancer Neg Hx   . Rectal cancer Neg Hx   . Stomach cancer Neg Hx     Social History   Socioeconomic History  . Marital status: Married    Spouse name: Not on file  . Number of children: Not on file  . Years of education: Not on file  . Highest education level: Not on file  Social Needs  . Financial resource strain: Not on file  . Food insecurity - worry: Not on file  . Food insecurity - inability: Not on file  . Transportation needs - medical: Not on file  . Transportation needs - non-medical: Not on file  Occupational History  . Occupation: Counselling psychologist  Tobacco Use  . Smoking status: Former Smoker    Packs/day: 1.00    Types: Cigarettes    Last attempt to quit: 07/17/2002    Years since quitting: 14.6  . Smokeless tobacco: Never Used  Substance and Sexual Activity  . Alcohol use: Yes    Alcohol/week: 3.5 oz    Types: 7 Standard drinks or equivalent per week  Comment: wine every night with dinner  . Drug use: No  . Sexual activity: Not on file  Other Topics Concern  . Not on file  Social History Narrative   Married, 1 child--grown.   Occupation: Financial planner"   Tobacco: 20 pack-yr hx; quit 2004.   Alcohol: nightly cocktail   Drugs: none   Exercise: "walk some when I can".   Diet: normal.          Outpatient Medications Prior to Visit  Medication Sig Dispense Refill  . albuterol (VENTOLIN HFA) 108 (90 Base) MCG/ACT inhaler Inhale 1-2 puffs into the lungs every 4 (four) hours as needed for wheezing or shortness of breath. 1 Inhaler 0  . atorvastatin (LIPITOR) 40 MG tablet TAKE 1 TABLET BY MOUTH EVERY DAY 30 tablet 6  . metFORMIN (GLUCOPHAGE) 500 MG tablet Take 1 tablet (500 mg total) by mouth 2 (two) times daily with a meal. 180 tablet 1  . methylphenidate (METADATE CD) 20 MG CR capsule Take 1 capsule (20 mg total) by mouth daily as needed. 30 capsule 0   No  facility-administered medications prior to visit.     No Known Allergies  ROS Review of Systems  Constitutional: Negative for appetite change, chills, fatigue and fever.  HENT: Negative for congestion, dental problem, ear pain and sore throat.   Eyes: Negative for discharge, redness and visual disturbance.  Respiratory: Negative for cough, chest tightness, shortness of breath and wheezing.   Cardiovascular: Negative for chest pain, palpitations and leg swelling.  Gastrointestinal: Negative for abdominal pain, blood in stool, diarrhea, nausea and vomiting.  Genitourinary: Negative for difficulty urinating, dysuria, flank pain, frequency, hematuria and urgency.  Musculoskeletal: Negative for arthralgias, back pain, joint swelling, myalgias and neck stiffness.  Skin: Negative for pallor and rash.  Neurological: Negative for dizziness, speech difficulty, weakness and headaches.  Hematological: Negative for adenopathy. Does not bruise/bleed easily.  Psychiatric/Behavioral: Negative for confusion and sleep disturbance. The patient is not nervous/anxious.     PE; Blood pressure 135/75, pulse 78, temperature 98.8 F (37.1 C), temperature source Oral, resp. rate 16, height 5' 9.5" (1.765 m), weight 251 lb 4 oz (114 kg), SpO2 94 %. Gen: Alert, well appearing.  Patient is oriented to person, place, time, and situation. AFFECT: pleasant, lucid thought and speech. ENT: Ears: EACs clear, normal epithelium.  TMs with good light reflex and landmarks bilaterally.  Eyes: no injection, icteris, swelling, or exudate.  EOMI, PERRLA. Nose: no drainage or turbinate edema/swelling.  No injection or focal lesion.  Mouth: lips without lesion/swelling.  Oral mucosa pink and moist.  Dentition intact and without obvious caries or gingival swelling.  Oropharynx without erythema, exudate, or swelling.  Neck: supple/nontender.  No LAD, mass, or TM.  Carotid pulses 2+ bilaterally, without bruits. CV: RRR, no m/r/g.    LUNGS: CTA bilat, nonlabored resps, good aeration in all lung fields. ABD: soft, NT, ND, BS normal.  No hepatospenomegaly or mass.  No bruits. EXT: no clubbing, cyanosis, or edema.  Musculoskeletal: no joint swelling, erythema, warmth, or tenderness.  ROM of all joints intact. Skin - no sores or suspicious lesions or rashes or color changes Rectal exam: negative without mass, lesions or tenderness, PROSTATE EXAM: smooth and symmetric without nodules or tenderness.   Pertinent labs:  Lab Results  Component Value Date   TSH 1.78 03/04/2016   Lab Results  Component Value Date   WBC 7.8 03/04/2016   HGB 15.3 03/04/2016   HCT 44.4 03/04/2016   MCV  106.1 (H) 03/04/2016   PLT 202.0 03/04/2016   Lab Results  Component Value Date   CREATININE 0.58 12/12/2016   BUN 10 12/12/2016   NA 138 12/12/2016   K 4.3 12/12/2016   CL 102 12/12/2016   CO2 26 12/12/2016   Lab Results  Component Value Date   ALT 42 12/12/2016   AST 31 12/12/2016   ALKPHOS 92 12/12/2016   BILITOT 0.5 12/12/2016   Lab Results  Component Value Date   CHOL 130 12/12/2016   Lab Results  Component Value Date   HDL 65.50 12/12/2016   Lab Results  Component Value Date   LDLCALC 48 12/12/2016   Lab Results  Component Value Date   TRIG 79.0 12/12/2016   Lab Results  Component Value Date   CHOLHDL 2 12/12/2016   Lab Results  Component Value Date   PSA 0.54 03/04/2016   PSA 0.34 02/27/2015   Lab Results  Component Value Date   HGBA1C 6.6 (H) 12/12/2016    ASSESSMENT AND PLAN:   Health maintenance exam:  Reviewed age and gender appropriate health maintenance issues (prudent diet, regular exercise, health risks of tobacco and excessive alcohol, use of seatbelts, fire alarms in home, use of sunscreen).  Also reviewed age and gender appropriate health screening as well as vaccine recommendations. Vaccines: Tetanus UTD.  Flu -- given today.  Shingrix discussed: rx printed and handed to pt. LABS: BMET,  HbA1c, PSA today (fasting). Prostate ca screening: DRE normal today , PSA. Colon ca screening: next colonoscopy due 2023.  An After Visit Summary was printed and given to the patient.  FOLLOW UP:  Return in about 3 months (around 05/27/2017) for routine chronic illness f/u.  Signed:  Crissie Sickles, MD           02/26/2017

## 2017-02-26 NOTE — Patient Instructions (Signed)

## 2017-02-26 NOTE — Addendum Note (Signed)
Addended by: Gordy Councilman on: 02/26/2017 11:45 AM   Modules accepted: Orders

## 2017-04-29 ENCOUNTER — Ambulatory Visit: Payer: 59 | Admitting: Family Medicine

## 2017-04-29 ENCOUNTER — Encounter: Payer: Self-pay | Admitting: Family Medicine

## 2017-04-29 VITALS — BP 145/75 | HR 119 | Temp 99.0°F | Ht 69.5 in | Wt 261.0 lb

## 2017-04-29 DIAGNOSIS — H6123 Impacted cerumen, bilateral: Secondary | ICD-10-CM | POA: Diagnosis not present

## 2017-04-29 DIAGNOSIS — R030 Elevated blood-pressure reading, without diagnosis of hypertension: Secondary | ICD-10-CM | POA: Diagnosis not present

## 2017-04-29 NOTE — Patient Instructions (Addendum)
The cerumen was removed today. If this does not improve you hearing or ringing in ears, then please be seen by PCP for further evaluation and baseline hearing test. The wax load was minimal today.    Earwax Buildup, Adult The ears produce a substance called earwax that helps keep bacteria out of the ear and protects the skin in the ear canal. Occasionally, earwax can build up in the ear and cause discomfort or hearing loss. What increases the risk? This condition is more likely to develop in people who:  Are male.  Are elderly.  Naturally produce more earwax.  Clean their ears often with cotton swabs.  Use earplugs often.  Use in-ear headphones often.  Wear hearing aids.  Have narrow ear canals.  Have earwax that is overly thick or sticky.  Have eczema.  Are dehydrated.  Have excess hair in the ear canal.  What are the signs or symptoms? Symptoms of this condition include:  Reduced or muffled hearing.  A feeling of fullness in the ear or feeling that the ear is plugged.  Fluid coming from the ear.  Ear pain.  Ear itch.  Ringing in the ear.  Coughing.  An obvious piece of earwax that can be seen inside the ear canal.  How is this diagnosed? This condition may be diagnosed based on:  Your symptoms.  Your medical history.  An ear exam. During the exam, your health care provider will look into your ear with an instrument called an otoscope.  You may have tests, including a hearing test. How is this treated? This condition may be treated by:  Using ear drops to soften the earwax.  Having the earwax removed by a health care provider. The health care provider may: ? Flush the ear with water. ? Use an instrument that has a loop on the end (curette). ? Use a suction device.  Surgery to remove the wax buildup. This may be done in severe cases.  Follow these instructions at home:  Take over-the-counter and prescription medicines only as told by your  health care provider.  Do not put any objects, including cotton swabs, into your ear. You can clean the opening of your ear canal with a washcloth or facial tissue.  Follow instructions from your health care provider about cleaning your ears. Do not over-clean your ears.  Drink enough fluid to keep your urine clear or pale yellow. This will help to thin the earwax.  Keep all follow-up visits as told by your health care provider. If earwax builds up in your ears often or if you use hearing aids, consider seeing your health care provider for routine, preventive ear cleanings. Ask your health care provider how often you should schedule your cleanings.  If you have hearing aids, clean them according to instructions from the manufacturer and your health care provider. Contact a health care provider if:  You have ear pain.  You develop a fever.  You have blood, pus, or other fluid coming from your ear.  You have hearing loss.  You have ringing in your ears that does not go away.  Your symptoms do not improve with treatment.  You feel like the room is spinning (vertigo). Summary  Earwax can build up in the ear and cause discomfort or hearing loss.  The most common symptoms of this condition include reduced or muffled hearing and a feeling of fullness in the ear or feeling that the ear is plugged.  This condition may be diagnosed  based on your symptoms, your medical history, and an ear exam.  This condition may be treated by using ear drops to soften the earwax or by having the earwax removed by a health care provider.  Do not put any objects, including cotton swabs, into your ear. You can clean the opening of your ear canal with a washcloth or facial tissue. This information is not intended to replace advice given to you by your health care provider. Make sure you discuss any questions you have with your health care provider. Document Released: 04/11/2004 Document Revised: 05/15/2016  Document Reviewed: 05/15/2016 Elsevier Interactive Patient Education  [employer].

## 2017-04-29 NOTE — Progress Notes (Signed)
Richard [name] , 07-29-1955, 62 y.o., male MRN: [id number] Patient Care Team    Relationship Specialty Notifications Start End  McGowen, Richard Blackwater, MD PCP - General Family Medicine  03/01/13   Richard Pulley, DO Consulting Physician Sports Medicine  10/25/13   Richard Brownie, MD Consulting Physician Dermatology  05/12/14     Chief Complaint  Patient presents with  . Cerumen Impaction    pt c/o of difficulty in hearing with both ears and ringing X 12 days.     Subjective: Pt presents for an OV with complaints of difficulty hearing and ringing of ears of 12 days duration.  He has had cerumen impaction needing lavage many times.   Depression screen [facility] 2/9 12/12/2016 04/05/2015  Decreased Interest 0 0  Down, Depressed, Hopeless 0 0  PHQ - 2 Score 0 0    No Known Allergies Social History   Tobacco Use  . Smoking status: Former Smoker    Packs/day: 1.00    Types: Cigarettes    Last attempt to quit: 07/17/2002    Years since quitting: 14.7  . Smokeless tobacco: Never Used  Substance Use Topics  . Alcohol use: Yes    Alcohol/week: 3.5 oz    Types: 7 Standard drinks or equivalent per week    Comment: wine every night with dinner   Past Medical History:  Diagnosis Date  . Adult ADHD   . Allergic rhinitis   . Anxiety    claustrophobic  . Colon polyps 2012 and 2013   All hyperplastic except one adenomatous w/out high grade dysplasia (recall 2023 per Dr. Deatra [name])  . Diabetes mellitus, type 2 ([place]) 09/27/2015   A1c 7.0%   . Elevated blood pressure reading without diagnosis of hypertension   . Elevated transaminase level 2017   Viral hep screening neg.  Suspect fatty liver.  . Hyperlipidemia    No improvement with TLC 2017  . Obesity   . OSA on CPAP    Severe.  Wears CPAP but pt does not know his setting.  (Dr. Melvyn [name] follows him)  . Seborrheic dermatitis of scalp    and face: hydrocort 2% cream bid prn to face, and fluocinonide 0.5% sol'n to scalp bid prn per Dr. Allyson [name]  (04/2014)   Past Surgical History:  Procedure Laterality Date  . COLONOSCOPY W/ POLYPECTOMY  04/2010; 10/2011   Initial screening TCS showed 6 polyps (one adenomatous).  Pt says he returned for repeat TCS 10/2011 showed one hyperplastic polyp: Recall 10 yrs per Dr. Deatra [name]  . MOUTH SURGERY     Family History  Problem Relation Age of Onset  . Lung cancer Father   . Cancer Father        nasal  . Allergies Father   . Colon cancer Neg Hx   . Esophageal cancer Neg Hx   . Rectal cancer Neg Hx   . Stomach cancer Neg Hx    Allergies as of 04/29/2017   No Known Allergies     Medication List        Accurate as of 04/29/17  4:49 PM. Always use your most recent med list.          albuterol 108 (90 Base) MCG/ACT inhaler Commonly known as:  VENTOLIN HFA Inhale 1-2 puffs into the lungs every 4 (four) hours as needed for wheezing or shortness of breath.   atorvastatin 40 MG tablet Commonly known as:  LIPITOR TAKE 1 TABLET BY MOUTH EVERY DAY   metFORMIN  500 MG tablet Commonly known as:  GLUCOPHAGE Take 1 tablet (500 mg total) by mouth 2 (two) times daily with a meal.   methylphenidate 20 MG CR capsule Commonly known as:  METADATE CD Take 1 capsule (20 mg total) by mouth daily as needed.       All past medical history, surgical history, allergies, family history, immunizations andmedications were updated in the EMR today and reviewed under the history and medication portions of their EMR.     ROS: Negative, with the exception of above mentioned in HPI   Objective:  BP (!) 145/75 (BP Location: Left Arm, Patient Position: Sitting, Cuff Size: Large)   Pulse (!) 119   Temp 99 F (37.2 C) (Oral)   Ht 5' 9.5" (1.765 m)   Wt 261 lb (118.4 kg)   SpO2 96%   BMI 37.99 kg/m  Body mass index is 37.99 kg/m. Gen: Afebrile. No acute distress. Nontoxic in appearance, well developed, well nourished.  HENT: AT. [place]. Bilateral TM visualized with out erythema, fullness of membrane or debris  against TM. Minimal ear cerumen build up in canal.  Eyes:Pupils Equal Round Reactive to light, Extraocular movements intact,  Conjunctiva without redness, discharge or icterus.  No exam data present No results found. No results found for this or any previous visit (from the past 24 hour(s)).  Assessment/Plan: Richard [name] is a 62 y.o. male present for OV for  Bilateral impacted cerumen Cerumen load is minimal today. Lavage bilateral ears for him per his request. He is to followup with PCP in 2 weeks if his symptoms are not resolved, so a hearing test can be completed.  Elevated BP/tachycardia: - pt encouraged to follow PCP. Possibly secondary to ADHD tx.   Reviewed expectations re: course of current medical issues.  Discussed self-management of symptoms.  Outlined signs and symptoms indicating need for more acute intervention.  Patient verbalized understanding and all questions were answered.  Patient received an After-Visit Summary.    No orders of the defined types were placed in this encounter.    Note is dictated utilizing voice recognition software. Although note has been proof read prior to signing, occasional typographical errors still can be missed. If any questions arise, please do not hesitate to call for verification.   electronically signed by:  Richard Pouch, DO  [place]

## 2017-05-13 DIAGNOSIS — G4733 Obstructive sleep apnea (adult) (pediatric): Secondary | ICD-10-CM | POA: Diagnosis not present

## 2017-05-27 ENCOUNTER — Ambulatory Visit: Payer: 59 | Admitting: Family Medicine

## 2017-06-06 ENCOUNTER — Ambulatory Visit: Payer: 59 | Admitting: Family Medicine

## 2017-06-06 ENCOUNTER — Encounter: Payer: Self-pay | Admitting: Family Medicine

## 2017-06-06 VITALS — BP 150/80 | HR 83 | Temp 98.5°F | Ht 69.5 in | Wt 258.0 lb

## 2017-06-06 DIAGNOSIS — E7800 Pure hypercholesterolemia, unspecified: Secondary | ICD-10-CM | POA: Diagnosis not present

## 2017-06-06 DIAGNOSIS — I10 Essential (primary) hypertension: Secondary | ICD-10-CM | POA: Diagnosis not present

## 2017-06-06 DIAGNOSIS — E119 Type 2 diabetes mellitus without complications: Secondary | ICD-10-CM

## 2017-06-06 DIAGNOSIS — F909 Attention-deficit hyperactivity disorder, unspecified type: Secondary | ICD-10-CM

## 2017-06-06 LAB — LIPID PANEL
CHOL/HDL RATIO: 2
CHOLESTEROL: 128 mg/dL (ref 0–200)
HDL: 55.5 mg/dL (ref >39.00)
LDL CALC: 45 mg/dL (ref 0–99)
NonHDL: 72.15
Triglycerides: 136 mg/dL (ref 0.0–149.0)
VLDL: 27.2 mg/dL (ref 0.0–40.0)

## 2017-06-06 LAB — HEMOGLOBIN A1C: Hgb A1c MFr Bld: 6.3 % (ref 4.6–6.5)

## 2017-06-06 LAB — MICROALBUMIN / CREATININE URINE RATIO
Creatinine,U: 189 mg/dL
MICROALB/CREAT RATIO: 17.9 mg/g (ref 0.0–30.0)
Microalb, Ur: 33.8 mg/dL — ABNORMAL HIGH (ref 0.0–1.9)

## 2017-06-06 MED ORDER — METHYLPHENIDATE HCL ER (CD) 20 MG PO CPCR: 20.0000 mg | ORAL_CAPSULE | Freq: Every day | ORAL | 0 refills | Status: DC | PRN

## 2017-06-06 NOTE — Progress Notes (Signed)
OFFICE VISIT  06/06/2017   CC:  Chief Complaint  Patient presents with  . Follow-up    RCI   HPI:    Patient is a 62 y.o. Caucasian male who presents for 3 mo f/u adult ADHD, DM 2, HLD.  Adult ADHD: Pt states all is going well with the med at current dosing: much improved focus, concentration, task completion.  Less frustration, better multitasking, less impulsivity and restlessness.  Mood is stable. No side effects from the medication.  As usual he uses this med infrequently--last rx was filled 3 mo ago, needs new rx today.   DM 2: no home glucose monitoring but is compliant with metformin 500 mg bid. Denies polyuria or polydipsia. Wants to put feet exam off until next f/u visit.  HLD: tolerating statin. Fasting today.  BPs: avg home systolic high 295J, doesn't recall diastolics or HR's. Review of avg bp here in office is 140s over 90, HRs avg 90 or so.  ROS: no CP, SOB, fatigue, dizziness, palpitations, or HAs.  No melena/hematochezia.  No myalgias or arthralgias.  Past Medical History:  Diagnosis Date  . Adult ADHD   . Allergic rhinitis   . Anxiety    claustrophobic  . Colon polyps 2012 and 2013   All hyperplastic except one adenomatous w/out high grade dysplasia (recall 2023 per Dr. Deatra Ina)  . Diabetes mellitus, type 2 ([place]) 09/27/2015   A1c 7.0%   . Elevated blood pressure reading without diagnosis of hypertension   . Elevated transaminase level 2017   Viral hep screening neg.  Suspect fatty liver.  . Hyperlipidemia    No improvement with TLC 2017  . Obesity   . OSA on CPAP    Severe.  Wears CPAP but pt does not know his setting.  (Dr. Melvyn Novas follows him)  . Seborrheic dermatitis of scalp    and face: hydrocort 2% cream bid prn to face, and fluocinonide 0.5% sol'n to scalp bid prn per Dr. Allyson Sabal (04/2014)    Past Surgical History:  Procedure Laterality Date  . COLONOSCOPY W/ POLYPECTOMY  04/2010; 10/2011   Initial screening TCS showed 6 polyps (one adenomatous).   Pt says he returned for repeat TCS 10/2011 showed one hyperplastic polyp: Recall 10 yrs per Dr. Deatra Ina  . MOUTH SURGERY      Outpatient Medications Prior to Visit  Medication Sig Dispense Refill  . atorvastatin (LIPITOR) 40 MG tablet TAKE 1 TABLET BY MOUTH EVERY DAY 30 tablet 6  . metFORMIN (GLUCOPHAGE) 500 MG tablet Take 1 tablet (500 mg total) by mouth 2 (two) times daily with a meal. 180 tablet 1  . methylphenidate (METADATE CD) 20 MG CR capsule Take 1 capsule (20 mg total) by mouth daily as needed. 30 capsule 0  . albuterol (VENTOLIN HFA) 108 (90 Base) MCG/ACT inhaler Inhale 1-2 puffs into the lungs every 4 (four) hours as needed for wheezing or shortness of breath. (Patient not taking: Reported on 06/06/2017) 1 Inhaler 0   No facility-administered medications prior to visit.     No Known Allergies  ROS As per HPI  PE: Blood pressure (!) 150/80, pulse 83, temperature 98.5 F (36.9 C), temperature source Oral, height 5' 9.5" (1.765 m), weight 258 lb (117 kg), SpO2 95 %. Gen: Alert, well appearing.  Patient is oriented to person, place, time, and situation. AFFECT: pleasant, lucid thought and speech. CV: RRR, no m/r/g.   LUNGS: CTA bilat, nonlabored resps, good aeration in all lung fields. EXT: no clubbing or  cyanosis.  R LL 2+ pitting edema.  L LL no edema.  LABS:  Lab Results  Component Value Date   TSH 1.78 03/04/2016   Lab Results  Component Value Date   WBC 7.8 03/04/2016   HGB 15.3 03/04/2016   HCT 44.4 03/04/2016   MCV 106.1 (H) 03/04/2016   PLT 202.0 03/04/2016   Lab Results  Component Value Date   CREATININE 0.59 02/26/2017   BUN 12 02/26/2017   NA 138 02/26/2017   K 4.4 02/26/2017   CL 102 02/26/2017   CO2 25 02/26/2017   Lab Results  Component Value Date   ALT 42 12/12/2016   AST 31 12/12/2016   ALKPHOS 92 12/12/2016   BILITOT 0.5 12/12/2016   Lab Results  Component Value Date   CHOL 130 12/12/2016   Lab Results  Component Value Date   HDL  65.50 12/12/2016   Lab Results  Component Value Date   LDLCALC 48 12/12/2016   Lab Results  Component Value Date   TRIG 79.0 12/12/2016   Lab Results  Component Value Date   CHOLHDL 2 12/12/2016   Lab Results  Component Value Date   PSA 0.40 02/26/2017   PSA 0.54 03/04/2016   PSA 0.34 02/27/2015   Lab Results  Component Value Date   HGBA1C 6.3 02/26/2017    IMPRESSION AND PLAN:  1) Adult ADHD: The current medical regimen is effective;  continue present plan and medications. Metadate CD 20mg , 1 tab qd ---he uses this on a prn basis such as work [employer], extended work from home.  2) DM 2: compliant with med. He deferred feet exam until next visit. Will do HbA1c and urine microalb/cr today. He is aware of need to arrange diab retpthy screening exam.  3) HLD: tolerating statin.  Repeat FLP today.  4) HTN: he is against a trial of med today, but will consider it in the future. He will take more consistent records of his home bp's and bring these to next o/v.  An After Visit Summary was printed and given to the patient.  FOLLOW UP: Return in about 3 months (around 09/06/2017) for routine chronic illness f/u.  Signed:  Crissie Sickles, MD           06/06/2017

## 2017-06-09 ENCOUNTER — Encounter: Payer: Self-pay | Admitting: *Deleted

## 2017-07-09 ENCOUNTER — Other Ambulatory Visit: Payer: Self-pay | Admitting: Family Medicine

## 2017-08-09 ENCOUNTER — Other Ambulatory Visit: Payer: Self-pay | Admitting: Family Medicine

## 2017-09-09 ENCOUNTER — Ambulatory Visit: Payer: 59 | Admitting: Family Medicine

## 2017-09-11 ENCOUNTER — Encounter: Payer: Self-pay | Admitting: Family Medicine

## 2017-09-11 ENCOUNTER — Ambulatory Visit: Payer: 59 | Admitting: Family Medicine

## 2017-09-11 VITALS — BP 150/79 | HR 79 | Temp 98.9°F | Resp 16 | Ht 69.5 in | Wt 255.0 lb

## 2017-09-11 DIAGNOSIS — I872 Venous insufficiency (chronic) (peripheral): Secondary | ICD-10-CM

## 2017-09-11 DIAGNOSIS — E7800 Pure hypercholesterolemia, unspecified: Secondary | ICD-10-CM | POA: Diagnosis not present

## 2017-09-11 DIAGNOSIS — F988 Other specified behavioral and emotional disorders with onset usually occurring in childhood and adolescence: Secondary | ICD-10-CM | POA: Diagnosis not present

## 2017-09-11 DIAGNOSIS — E119 Type 2 diabetes mellitus without complications: Secondary | ICD-10-CM | POA: Diagnosis not present

## 2017-09-11 DIAGNOSIS — E669 Obesity, unspecified: Secondary | ICD-10-CM | POA: Diagnosis not present

## 2017-09-11 LAB — BASIC METABOLIC PANEL
BUN: 9 mg/dL (ref 6–23)
CHLORIDE: 101 meq/L (ref 96–112)
CO2: 27 meq/L (ref 19–32)
CREATININE: 0.62 mg/dL (ref 0.40–1.50)
Calcium: 10 mg/dL (ref 8.4–10.5)
GFR: 139.97 mL/min (ref >60.00)
Glucose, Bld: 133 mg/dL — ABNORMAL HIGH (ref 70–99)
POTASSIUM: 4.4 meq/L (ref 3.5–5.1)
Sodium: 137 mEq/L (ref 135–145)

## 2017-09-11 LAB — HEMOGLOBIN A1C: Hgb A1c MFr Bld: 6.5 % (ref 4.6–6.5)

## 2017-09-11 MED ORDER — METHYLPHENIDATE HCL ER (CD) 20 MG PO CPCR: 20.0000 mg | ORAL_CAPSULE | Freq: Every day | ORAL | 0 refills | Status: DC | PRN

## 2017-09-11 NOTE — Progress Notes (Signed)
OFFICE VISIT  09/11/2017   CC:  Chief Complaint  Patient presents with  . Follow-up    RCI, pt is fasting.    HPI:    Patient is a 62 y.o. Caucasian male who presents for 3 mo f/u DM 2, HLD, and adult ADD.  Adult ADD: Pt states all is going well with the med at current dosing: much improved focus, concentration, task completion.  Less frustration, better multitasking, less impulsivity and restlessness.  Mood is stable. No side effects from the medication.  DM: no home glucose checks.  Compliant with metformin 500mg  bid. No burning, tingling, or numbness in feet.  Wife noted his right foot swollen a [place] ago at the beach.  HLD: compliant with statin, no side effects. Diet fairly good. Exercise: walking a lot more now.  ROS: no CP, no SOB, no myalgias, no polyuria or polydipsia, no HA's, no palpitations, no dizziness.  Past Medical History:  Diagnosis Date  . Adult ADHD   . Allergic rhinitis   . Anxiety    claustrophobic  . Colon polyps 2012 and 2013   All hyperplastic except one adenomatous w/out high grade dysplasia (recall 2023 per Dr. Deatra Ina)  . Diabetes mellitus, type 2 ([place]) 09/27/2015   A1c 7.0%   . Elevated blood pressure reading without diagnosis of hypertension   . Elevated transaminase level 2017   Viral hep screening neg.  Suspect fatty liver.  . Hyperlipidemia    No improvement with TLC 2017  . Obesity   . OSA on CPAP    Severe.  Wears CPAP but pt does not know his setting.  (Dr. Melvyn Novas follows him)  . Seborrheic dermatitis of scalp    and face: hydrocort 2% cream bid prn to face, and fluocinonide 0.5% sol'n to scalp bid prn per Dr. Allyson Sabal (04/2014)    Past Surgical History:  Procedure Laterality Date  . COLONOSCOPY W/ POLYPECTOMY  04/2010; 10/2011   Initial screening TCS showed 6 polyps (one adenomatous).  Pt says he returned for repeat TCS 10/2011 showed one hyperplastic polyp: Recall 10 yrs per Dr. Deatra Ina  . MOUTH SURGERY      Outpatient  Medications Prior to Visit  Medication Sig Dispense Refill  . atorvastatin (LIPITOR) 40 MG tablet TAKE 1 TABLET BY MOUTH EVERY DAY 90 tablet 1  . metFORMIN (GLUCOPHAGE) 500 MG tablet TAKE 1 TABLET (500 MG TOTAL) BY MOUTH 2 (TWO) TIMES DAILY WITH A MEAL. 180 tablet 1  . methylphenidate (METADATE CD) 20 MG CR capsule Take 1 capsule (20 mg total) by mouth daily as needed. 30 capsule 0  . albuterol (VENTOLIN HFA) 108 (90 Base) MCG/ACT inhaler Inhale 1-2 puffs into the lungs every 4 (four) hours as needed for wheezing or shortness of breath. (Patient not taking: Reported on 06/06/2017) 1 Inhaler 0   No facility-administered medications prior to visit.     No Known Allergies  ROS As per HPI  PE: Blood pressure (!) 150/79, pulse 79, temperature 98.9 F (37.2 C), temperature source Oral, resp. rate 16, height 5' 9.5" (1.765 m), weight 255 lb (115.7 kg), SpO2 96 %. Body mass index is 37.12 kg/m.  Wt Readings from Last 2 Encounters:  09/11/17 255 lb (115.7 kg)  06/06/17 258 lb (117 kg)    Gen: alert, oriented x 4, affect pleasant.  Lucid thinking and conversation noted. HEENT: PERRLA, EOMI.   Neck: no LAD, mass, or thyromegaly. CV: RRR, no m/r/g LUNGS: CTA bilat, nonlabored. NEURO: no tremor or tics  noted on observation.  Coordination intact. CN 2-12 grossly intact bilaterally, strength 5/5 in all extremeties.  No ataxia. LL's: trace pitting L LL, none in foot.  1-2 + pitting R LL into foot.  Diffuse bronzing/freckling hemosiderin skin changes in pretibial regions and dorsum of both feet.  Some small varicosities noted.  LABS:  Lab Results  Component Value Date   TSH 1.78 03/04/2016   Lab Results  Component Value Date   WBC 7.8 03/04/2016   HGB 15.3 03/04/2016   HCT 44.4 03/04/2016   MCV 106.1 (H) 03/04/2016   PLT 202.0 03/04/2016   Lab Results  Component Value Date   CREATININE 0.59 02/26/2017   BUN 12 02/26/2017   NA 138 02/26/2017   K 4.4 02/26/2017   CL 102 02/26/2017    CO2 25 02/26/2017   Lab Results  Component Value Date   ALT 42 12/12/2016   AST 31 12/12/2016   ALKPHOS 92 12/12/2016   BILITOT 0.5 12/12/2016   Lab Results  Component Value Date   CHOL 128 06/06/2017   Lab Results  Component Value Date   HDL 55.50 06/06/2017   Lab Results  Component Value Date   LDLCALC 45 06/06/2017   Lab Results  Component Value Date   TRIG 136.0 06/06/2017   Lab Results  Component Value Date   CHOLHDL 2 06/06/2017   Lab Results  Component Value Date   PSA 0.40 02/26/2017   PSA 0.54 03/04/2016   PSA 0.34 02/27/2015   Lab Results  Component Value Date   HGBA1C 6.3 06/06/2017    IMPRESSION AND PLAN:  1) DM 2: has been well controlled. HbA1c today. Feet exam normal today. Lytes/cr today.  2) HLD: tolerating statin.  Last lipid panel excellent 3 mo ago. Repeat in 6 mo at CPE.  3) Adult ADD: The current medical regimen is effective;  continue present plan and medications. Controlled substance contract reviewed with patient today.  Patient signed this and it will be placed in the chart.   Metadate CD 20mg , 1 qd prn, #30 rx given to pt today.  4) Chronic venous insufficiency edema: R>L.  Reassured pt. Low Na diet encouraged.  Elevate legs prn. No compression stockings or diuretics needed.  An After Visit Summary was printed and given to the patient.  FOLLOW UP: Return in about 6 months (around 03/13/2018) for annual CPE (fasting).  Signed:  Crissie Sickles, MD           09/11/2017

## 2017-09-12 ENCOUNTER — Encounter: Payer: Self-pay | Admitting: *Deleted

## 2017-10-20 DIAGNOSIS — G4733 Obstructive sleep apnea (adult) (pediatric): Secondary | ICD-10-CM | POA: Diagnosis not present

## 2017-11-10 DIAGNOSIS — H40013 Open angle with borderline findings, low risk, bilateral: Secondary | ICD-10-CM | POA: Diagnosis not present

## 2017-11-10 DIAGNOSIS — E119 Type 2 diabetes mellitus without complications: Secondary | ICD-10-CM | POA: Diagnosis not present

## 2017-11-10 LAB — HM DIABETES EYE EXAM

## 2017-11-12 ENCOUNTER — Encounter: Payer: Self-pay | Admitting: Family Medicine

## 2017-11-27 ENCOUNTER — Other Ambulatory Visit: Payer: Self-pay | Admitting: Family Medicine

## 2017-11-27 NOTE — Telephone Encounter (Signed)
Refill of methylphenidate  LOV 09/11/17  LRF 09/11/17  #30  0 refills  [id number] - [place], [place] - [address] (Phone) [phone] (Fax)

## 2017-11-27 NOTE — Telephone Encounter (Signed)
Copied from [place] [id number]. Topic: Quick Communication - Rx Refill/Question >> Nov 27, 2017  4:33 PM Mcneil, Ja-Kwan wrote: Medication: methylphenidate (METADATE CD) 20 MG CR capsule  Has the patient contacted their pharmacy? No  Preferred Pharmacy (with phone number or street name): [id number] - [place], [place] [phone] (Phone) [phone] (Fax)  Agent: Please be advised that RX refills may take up to 3 business days. We ask that you follow-up with your pharmacy.

## 2017-11-28 NOTE — Telephone Encounter (Signed)
RF request for methylphenidate LOV: 09/11/17 Next ov: 03/03/18 Last written: 09/11/17 #30 w/ 0RF  Please advise. Thanks.

## 2017-12-01 MED ORDER — METHYLPHENIDATE HCL ER (CD) 20 MG PO CPCR: 20.0000 mg | ORAL_CAPSULE | Freq: Every day | ORAL | 0 refills | Status: DC | PRN

## 2017-12-12 ENCOUNTER — Ambulatory Visit (INDEPENDENT_AMBULATORY_CARE_PROVIDER_SITE_OTHER): Payer: 59

## 2017-12-12 DIAGNOSIS — Z23 Encounter for immunization: Secondary | ICD-10-CM | POA: Diagnosis not present

## 2017-12-19 ENCOUNTER — Encounter: Payer: Self-pay | Admitting: Family Medicine

## 2017-12-19 ENCOUNTER — Ambulatory Visit: Payer: 59 | Admitting: Family Medicine

## 2017-12-19 VITALS — BP 131/82 | HR 116 | Temp 99.3°F | Resp 16 | Ht 69.5 in | Wt 255.0 lb

## 2017-12-19 DIAGNOSIS — J069 Acute upper respiratory infection, unspecified: Secondary | ICD-10-CM | POA: Diagnosis not present

## 2017-12-19 DIAGNOSIS — J209 Acute bronchitis, unspecified: Secondary | ICD-10-CM | POA: Diagnosis not present

## 2017-12-19 MED ORDER — PREDNISONE 20 MG PO TABS: ORAL_TABLET | ORAL | 0 refills | Status: DC

## 2017-12-19 MED ORDER — AZITHROMYCIN 250 MG PO TABS: ORAL_TABLET | ORAL | 0 refills | Status: DC

## 2017-12-19 MED ORDER — HYDROCODONE-HOMATROPINE 5-1.5 MG/5ML PO SYRP: ORAL_SOLUTION | ORAL | 0 refills | Status: DC

## 2017-12-19 MED ORDER — ALBUTEROL SULFATE HFA 108 (90 BASE) MCG/ACT IN AERS: 1.0000 | INHALATION_SPRAY | RESPIRATORY_TRACT | 0 refills | Status: DC | PRN

## 2017-12-19 NOTE — Progress Notes (Signed)
OFFICE VISIT  12/19/2017   CC:  Chief Complaint  Patient presents with  . Cough   HPI:    Patient is a 62 y.o. Caucasian male who presents for cough. Onset 4-5d ago, nasal congestion/runny nose, PND, chest feels tight, hears rattle but no wheezing. No fever.  Took some mucinex dm and albuterol ---  Past Medical History:  Diagnosis Date  . Adult ADHD   . Allergic rhinitis   . Anxiety    claustrophobic  . Colon polyps 2012 and 2013   All hyperplastic except one adenomatous w/out high grade dysplasia (recall 2023 per Richard [name])  . Diabetes mellitus, type 2 ([place]) 09/27/2015   A1c 7.0%   . Elevated blood pressure reading without diagnosis of hypertension   . Elevated transaminase level 2017   Viral hep screening neg.  Suspect fatty liver.  . Hyperlipidemia    No improvement with TLC 2017  . Obesity   . OSA on CPAP    Severe.  Wears CPAP but pt does not know his setting.  (Richard [name] follows him)  . Seborrheic dermatitis of scalp    and face: hydrocort 2% cream bid prn to face, and fluocinonide 0.5% sol'n to scalp bid prn per Richard [name] (04/2014)    Past Surgical History:  Procedure Laterality Date  . COLONOSCOPY W/ POLYPECTOMY  04/2010; 10/2011   Initial screening TCS showed 6 polyps (one adenomatous).  Pt says he returned for repeat TCS 10/2011 showed one hyperplastic polyp: Recall 10 yrs per Richard [name]  . MOUTH SURGERY      Outpatient Medications Prior to Visit  Medication Sig Dispense Refill  . atorvastatin (LIPITOR) 40 MG tablet TAKE 1 TABLET BY MOUTH EVERY DAY 90 tablet 1  . metFORMIN (GLUCOPHAGE) 500 MG tablet TAKE 1 TABLET (500 MG TOTAL) BY MOUTH 2 (TWO) TIMES DAILY WITH A MEAL. 180 tablet 1  . methylphenidate (METADATE CD) 20 MG CR capsule Take 1 capsule (20 mg total) by mouth daily as needed. 30 capsule 0   No facility-administered medications prior to visit.     No Known Allergies  ROS As per HPI  PE: Blood pressure 131/82, pulse (!) 116, temperature 99.3 F  (37.4 C), temperature source Oral, resp. rate 16, height 5' 9.5" (1.765 m), weight 255 lb (115.7 kg), SpO2 93 %. Body mass index is 37.12 kg/m.  VS: noted--normal. Gen: alert, NAD, NONTOXIC APPEARING. HEENT: eyes without injection, drainage, or swelling.  Ears: EACs clear, TMs with normal light reflex and landmarks.  Nose: Clear rhinorrhea, with some dried, crusty exudate adherent to mildly injected mucosa.  No purulent d/c.  No paranasal sinus TTP.  No facial swelling.  Throat and mouth without focal lesion.  No pharyngial swelling, erythema, or exudate.   Neck: supple, no LAD.   LUNGS: CTA bilat, nonlabored resps.  He has coarse exp rhonchi that clear with coughing, but also has trace end-exp wheeze with mildly prolonged exp phase.  Excessive coughing on exhalation. CV: RRR, no m/r/g. EXT: no c/c/e SKIN: no rash    LABS:   Lab Results  Component Value Date   CHOL 128 06/06/2017   HDL 55.50 06/06/2017   LDLCALC 45 06/06/2017   LDLDIRECT 139.9 06/13/2011   TRIG 136.0 06/06/2017   CHOLHDL 2 06/06/2017    Lab Results  Component Value Date   HGBA1C 6.5 09/11/2017      Chemistry      Component Value Date/Time   NA 137 09/11/2017 1156   K  4.4 09/11/2017 1156   CL 101 09/11/2017 1156   CO2 27 09/11/2017 1156   BUN 9 09/11/2017 1156   CREATININE 0.62 09/11/2017 1156      Component Value Date/Time   CALCIUM 10.0 09/11/2017 1156   ALKPHOS 92 12/12/2016 1134   AST 31 12/12/2016 1134   ALT 42 12/12/2016 1134   BILITOT 0.5 12/12/2016 1134      IMPRESSION AND PLAN:  Viral URI with acute bronchitis--RAD component present. Prednisone 40mg  qd x 5d, then 20mg  qd x 5d. Albuterol HFA eRx'd. Get otc generic robitussin DM OR Mucinex DM and use as directed on the packaging for cough and congestion. Use otc generic saline nasal spray 2-3 times per day to irrigate/moisturize your nasal passages. Hycodan syrup, 1-2 tsp po qhs prn cough, #120 ml.  Therapeutic expectations and side  effect profile of medication discussed today.  Patient's questions answered.  An After Visit Summary was printed and given to the patient.  FOLLOW UP: Return if symptoms worsen or fail to improve.  Signed:  Crissie Sickles, MD           12/19/2017

## 2018-01-09 ENCOUNTER — Other Ambulatory Visit: Payer: Self-pay | Admitting: Family Medicine

## 2018-02-07 ENCOUNTER — Other Ambulatory Visit: Payer: Self-pay | Admitting: Family Medicine

## 2018-02-24 DIAGNOSIS — G4733 Obstructive sleep apnea (adult) (pediatric): Secondary | ICD-10-CM | POA: Diagnosis not present

## 2018-03-03 ENCOUNTER — Ambulatory Visit (INDEPENDENT_AMBULATORY_CARE_PROVIDER_SITE_OTHER): Payer: 59 | Admitting: Family Medicine

## 2018-03-03 ENCOUNTER — Encounter: Payer: Self-pay | Admitting: Family Medicine

## 2018-03-03 VITALS — BP 130/77 | HR 94 | Temp 99.2°F | Resp 16 | Ht 69.5 in | Wt 257.0 lb

## 2018-03-03 DIAGNOSIS — Z23 Encounter for immunization: Secondary | ICD-10-CM

## 2018-03-03 DIAGNOSIS — Z0000 Encounter for general adult medical examination without abnormal findings: Secondary | ICD-10-CM

## 2018-03-03 DIAGNOSIS — E7800 Pure hypercholesterolemia, unspecified: Secondary | ICD-10-CM | POA: Diagnosis not present

## 2018-03-03 DIAGNOSIS — E669 Obesity, unspecified: Secondary | ICD-10-CM

## 2018-03-03 DIAGNOSIS — E119 Type 2 diabetes mellitus without complications: Secondary | ICD-10-CM | POA: Diagnosis not present

## 2018-03-03 DIAGNOSIS — Z125 Encounter for screening for malignant neoplasm of prostate: Secondary | ICD-10-CM | POA: Diagnosis not present

## 2018-03-03 LAB — COMPREHENSIVE METABOLIC PANEL
ALBUMIN: 4.6 g/dL (ref 3.5–5.2)
ALK PHOS: 86 U/L (ref 39–117)
ALT: 32 U/L (ref 0–53)
AST: 32 U/L (ref 0–37)
BILIRUBIN TOTAL: 0.5 mg/dL (ref 0.2–1.2)
BUN: 10 mg/dL (ref 6–23)
CO2: 26 mEq/L (ref 19–32)
CREATININE: 0.58 mg/dL (ref 0.40–1.50)
Calcium: 10.1 mg/dL (ref 8.4–10.5)
Chloride: 103 mEq/L (ref 96–112)
GFR: 150.93 mL/min (ref >60.00)
Glucose, Bld: 119 mg/dL — ABNORMAL HIGH (ref 70–99)
Potassium: 4.6 mEq/L (ref 3.5–5.1)
Sodium: 138 mEq/L (ref 135–145)
Total Protein: 7.2 g/dL (ref 6.0–8.3)

## 2018-03-03 LAB — CBC WITH DIFFERENTIAL/PLATELET
Basophils Absolute: 0.2 10*3/uL — ABNORMAL HIGH (ref 0.0–0.1)
Basophils Relative: 2.5 % (ref 0.0–3.0)
Eosinophils Absolute: 0.2 10*3/uL (ref 0.0–0.7)
Eosinophils Relative: 3.5 % (ref 0.0–5.0)
HCT: 46.4 % (ref 39.0–52.0)
Hemoglobin: 15.6 g/dL (ref 13.0–17.0)
LYMPHS ABS: 1.3 10*3/uL (ref 0.7–4.0)
Lymphocytes Relative: 20.3 % (ref 12.0–46.0)
MCHC: 33.7 g/dL (ref 30.0–36.0)
MCV: 109.3 fl — ABNORMAL HIGH (ref 78.0–100.0)
MONO ABS: 0.5 10*3/uL (ref 0.1–1.0)
Monocytes Relative: 7.3 % (ref 3.0–12.0)
Neutro Abs: 4.2 10*3/uL (ref 1.4–7.7)
Neutrophils Relative %: 66.4 % (ref 43.0–77.0)
Platelets: 215 10*3/uL (ref 150.0–400.0)
RBC: 4.25 Mil/uL (ref 4.22–5.81)
RDW: 14.3 % (ref 11.5–15.5)
WBC: 6.4 10*3/uL (ref 4.0–10.5)

## 2018-03-03 LAB — PSA: PSA: 0.3 ng/mL (ref 0.10–4.00)

## 2018-03-03 LAB — TSH: TSH: 1.67 u[IU]/mL (ref 0.35–4.50)

## 2018-03-03 LAB — LIPID PANEL
CHOL/HDL RATIO: 2
CHOLESTEROL: 131 mg/dL (ref 0–200)
HDL: 59.1 mg/dL (ref >39.00)
LDL Cholesterol: 56 mg/dL (ref 0–99)
NonHDL: 72.1
TRIGLYCERIDES: 80 mg/dL (ref 0.0–149.0)
VLDL: 16 mg/dL (ref 0.0–40.0)

## 2018-03-03 LAB — HEMOGLOBIN A1C: HEMOGLOBIN A1C: 6.3 % (ref 4.6–6.5)

## 2018-03-03 MED ORDER — METHYLPHENIDATE HCL ER (CD) 20 MG PO CPCR: 20.0000 mg | ORAL_CAPSULE | Freq: Every day | ORAL | 0 refills | Status: DC | PRN

## 2018-03-03 NOTE — Patient Instructions (Signed)

## 2018-03-03 NOTE — Addendum Note (Signed)
Addended by: Onalee Hua on: 03/03/2018 11:43 AM   Modules accepted: Orders

## 2018-03-03 NOTE — Progress Notes (Signed)
Office Note 03/03/2018  CC:  Chief Complaint  Patient presents with  . Annual Exam    Pt is not fasting.     HPI:  Richard [name] is a 62 y.o. White male who is here for annual health maintenance exam and f/u adult ADD.  Exercise: none Diet: minimizing carbs, drinking lots of water. Dentals UTD. Diab retpthy screening NEG recently.   Past Medical History:  Diagnosis Date  . Adult ADHD   . Allergic rhinitis   . Anxiety    claustrophobic  . Colon polyps 2012 and 2013   All hyperplastic except one adenomatous w/out high grade dysplasia (recall 2023 per Dr. Deatra Ina)  . Diabetes mellitus, type 2 ([place]) 09/27/2015   A1c 7.0%   . Elevated blood pressure reading without diagnosis of hypertension   . Elevated transaminase level 2017   Viral hep screening neg.  Suspect fatty liver.  . Hyperlipidemia    No improvement with TLC 2017  . Obesity   . OSA on CPAP    Severe.  Wears CPAP but pt does not know his setting.  (Dr. Melvyn Novas follows him)  . Seborrheic dermatitis of scalp    and face: hydrocort 2% cream bid prn to face, and fluocinonide 0.5% sol'n to scalp bid prn per Dr. Allyson Sabal (04/2014)    Past Surgical History:  Procedure Laterality Date  . COLONOSCOPY W/ POLYPECTOMY  04/2010; 10/2011   Initial screening TCS showed 6 polyps (one adenomatous).  Pt says he returned for repeat TCS 10/2011 showed one hyperplastic polyp: Recall 10 yrs per Dr. Deatra Ina  . MOUTH SURGERY      Family History  Problem Relation Age of Onset  . Lung cancer Father   . Cancer Father        nasal  . Allergies Father   . Colon cancer Neg Hx   . Esophageal cancer Neg Hx   . Rectal cancer Neg Hx   . Stomach cancer Neg Hx     Social History   Socioeconomic History  . Marital status: Married    Spouse name: Not on file  . Number of children: Not on file  . Years of education: Not on file  . Highest education level: Not on file  Occupational History  . Occupation: Counselling psychologist  Social  Needs  . Financial resource strain: Not on file  . Food insecurity:    Worry: Not on file    Inability: Not on file  . Transportation needs:    Medical: Not on file    Non-medical: Not on file  Tobacco Use  . Smoking status: Former Smoker    Packs/day: 1.00    Types: Cigarettes    Last attempt to quit: 07/17/2002    Years since quitting: 15.6  . Smokeless tobacco: Never Used  Substance and Sexual Activity  . Alcohol use: Yes    Alcohol/week: 7.0 standard drinks    Types: 7 Standard drinks or equivalent per week    Comment: wine every night with dinner  . Drug use: No  . Sexual activity: Not on file  Lifestyle  . Physical activity:    Days per week: Not on file    Minutes per session: Not on file  . Stress: Not on file  Relationships  . Social connections:    Talks on phone: Not on file    Gets together: Not on file    Attends religious service: Not on file    Active member of club or  organization: Not on file    Attends meetings of clubs or organizations: Not on file    Relationship status: Not on file  . Intimate partner violence:    Fear of current or ex partner: Not on file    Emotionally abused: Not on file    Physically abused: Not on file    Forced sexual activity: Not on file  Other Topics Concern  . Not on file  Social History Narrative   Married, 1 child--grown.   Occupation: Financial planner"   Tobacco: 20 pack-yr hx; quit 2004.   Alcohol: nightly cocktail   Drugs: none   Exercise: "walk some when I can".   Diet: normal.          Outpatient Medications Prior to Visit  Medication Sig Dispense Refill  . albuterol (VENTOLIN HFA) 108 (90 Base) MCG/ACT inhaler Inhale 1-2 puffs into the lungs every 4 (four) hours as needed for wheezing or shortness of breath. 1 Inhaler 0  . atorvastatin (LIPITOR) 40 MG tablet TAKE 1 TABLET BY MOUTH EVERY DAY 30 tablet 5  . metFORMIN (GLUCOPHAGE) 500 MG tablet TAKE 1 TABLET (500 MG TOTAL) BY MOUTH 2 (TWO) TIMES DAILY WITH A MEAL.  60 tablet 5  . methylphenidate (METADATE CD) 20 MG CR capsule Take 1 capsule (20 mg total) by mouth daily as needed. 30 capsule 0  . HYDROcodone-homatropine (HYCODAN) 5-1.5 MG/5ML syrup 1-2 tsp po qhs prn cough (Patient not taking: Reported on 03/03/2018) 120 mL 0  . predniSONE (DELTASONE) 20 MG tablet 2 tabs po qd x 5d, then 1 tab po qd x 5d (Patient not taking: Reported on 03/03/2018) 15 tablet 0   No facility-administered medications prior to visit.     No Known Allergies  ROS Review of Systems  Constitutional: Negative for appetite change, chills, fatigue and fever.  HENT: Negative for congestion, dental problem, ear pain and sore throat.   Eyes: Negative for discharge, redness and visual disturbance.  Respiratory: Negative for cough, chest tightness, shortness of breath and wheezing.   Cardiovascular: Negative for chest pain, palpitations and leg swelling.  Gastrointestinal: Negative for abdominal pain, blood in stool, diarrhea, nausea and vomiting.  Genitourinary: Negative for difficulty urinating, dysuria, flank pain, frequency, hematuria and urgency.  Musculoskeletal: Negative for arthralgias, back pain, joint swelling, myalgias and neck stiffness.  Skin: Negative for pallor and rash.  Neurological: Negative for dizziness, speech difficulty, weakness and headaches.  Hematological: Negative for adenopathy. Does not bruise/bleed easily.  Psychiatric/Behavioral: Negative for confusion and sleep disturbance. The patient is not nervous/anxious.     PE; Blood pressure 130/77, pulse 94, temperature 99.2 F (37.3 C), temperature source Oral, resp. rate 16, height 5' 9.5" (1.765 m), weight 257 lb (116.6 kg), SpO2 94 %. Gen: Alert, well appearing.  Patient is oriented to person, place, time, and situation. AFFECT: pleasant, lucid thought and speech. ENT: Ears: EACs clear, normal epithelium.  TMs with good light reflex and landmarks bilaterally.  Eyes: no injection, icteris, swelling, or  exudate.  EOMI, PERRLA. Nose: no drainage or turbinate edema/swelling.  No injection or focal lesion.  Mouth: lips without lesion/swelling.  Oral mucosa pink and moist.  Dentition intact and without obvious caries or gingival swelling.  Oropharynx without erythema, exudate, or swelling.  Neck: supple/nontender.  No LAD, mass, or TM.  Carotid pulses 2+ bilaterally, without bruits. CV: RRR, no m/r/g.   LUNGS: CTA bilat, nonlabored resps, good aeration in all lung fields. ABD: soft, NT, ND, BS normal.  No hepatospenomegaly  or mass.  No bruits. EXT: no clubbing, cyanosis, or edema.  Musculoskeletal: no joint swelling, erythema, warmth, or tenderness.  ROM of all joints intact. Skin - no sores or suspicious lesions or rashes or color changes Rectal exam: negative without mass, lesions or tenderness, PROSTATE EXAM: smooth and symmetric without nodules or tenderness.   Pertinent labs:  Lab Results  Component Value Date   TSH 1.78 03/04/2016   Lab Results  Component Value Date   WBC 7.8 03/04/2016   HGB 15.3 03/04/2016   HCT 44.4 03/04/2016   MCV 106.1 (H) 03/04/2016   PLT 202.0 03/04/2016   Lab Results  Component Value Date   CREATININE 0.62 09/11/2017   BUN 9 09/11/2017   NA 137 09/11/2017   K 4.4 09/11/2017   CL 101 09/11/2017   CO2 27 09/11/2017   Lab Results  Component Value Date   ALT 42 12/12/2016   AST 31 12/12/2016   ALKPHOS 92 12/12/2016   BILITOT 0.5 12/12/2016   Lab Results  Component Value Date   CHOL 128 06/06/2017   Lab Results  Component Value Date   HDL 55.50 06/06/2017   Lab Results  Component Value Date   LDLCALC 45 06/06/2017   Lab Results  Component Value Date   TRIG 136.0 06/06/2017   Lab Results  Component Value Date   CHOLHDL 2 06/06/2017   Lab Results  Component Value Date   PSA 0.40 02/26/2017   PSA 0.54 03/04/2016   PSA 0.34 02/27/2015   Lab Results  Component Value Date   HGBA1C 6.5 09/11/2017    ASSESSMENT AND PLAN:    Health maintenance exam: Reviewed age and gender appropriate health maintenance issues (prudent diet, regular exercise, health risks of tobacco and excessive alcohol, use of seatbelts, fire alarms in home, use of sunscreen).  Also reviewed age and gender appropriate health screening as well as vaccine recommendations. Vaccines: all UTD.  Shingrix discussed-->#1 today. Labs: fasting HP + PSA+ HbA1c. Prostate ca screening: DRE normal today, PSA. Colon ca screening: next colonoscopy 2023.  Adult ADD: The current medical regimen is effective;  continue present plan and medications. Uses metadate CD pretty infrequently.  Most recent rx fill was 12/01/17. Erx'd metadate CD 20mg , 1 cap po qd prn, #30 today.  An After Visit Summary was printed and given to the patient.  FOLLOW UP:  Return in about 3 months (around 06/02/2018).  Signed:  Crissie Sickles, MD           03/03/2018

## 2018-04-09 ENCOUNTER — Ambulatory Visit: Payer: 59 | Admitting: Family Medicine

## 2018-04-09 ENCOUNTER — Encounter: Payer: Self-pay | Admitting: Family Medicine

## 2018-04-09 VITALS — BP 142/84 | HR 117 | Temp 98.1°F | Resp 17 | Ht 69.5 in | Wt 257.5 lb

## 2018-04-09 DIAGNOSIS — R062 Wheezing: Secondary | ICD-10-CM

## 2018-04-09 DIAGNOSIS — J209 Acute bronchitis, unspecified: Secondary | ICD-10-CM | POA: Diagnosis not present

## 2018-04-09 DIAGNOSIS — R000 Tachycardia, unspecified: Secondary | ICD-10-CM

## 2018-04-09 DIAGNOSIS — J069 Acute upper respiratory infection, unspecified: Secondary | ICD-10-CM | POA: Diagnosis not present

## 2018-04-09 MED ORDER — PREDNISONE 20 MG PO TABS: ORAL_TABLET | ORAL | 0 refills | Status: DC

## 2018-04-09 NOTE — Patient Instructions (Signed)
Get otc generic robitussin DM OR Mucinex DM and use as directed on the packaging for cough and congestion. Use otc generic saline nasal spray 2-3 times per day to irrigate/moisturize your nasal passages.   

## 2018-04-09 NOTE — Progress Notes (Signed)
OFFICE VISIT  04/09/2018   CC:  Chief Complaint  Patient presents with  . Cough    productive cough x6 days, SOB all the time, Pt has been taking mucinex    HPI:    Patient is a 63 y.o. Caucasian male who presents for cough.  (Of note, he has a hx of RAD when he gets URI/bronchitis, most recently 12/2017-->required prednisone x 10d, albuterol).  Onset 5-6 d/a, gradual onset. Nasal congestion/runny nose w/PND.  Then cough came along.  Cough mostly productive now, esp in mornings. No subjective fever.  Chest tightness and wheezing.  No ST or HA.   Albut inhaler helps some, for a short period.  Mucinex DM.  ROS: no CP, no n/v/d, no dizziness, no rashes.  No myalgias or arthralgias.   Past Medical History:  Diagnosis Date  . Adult ADHD   . Allergic rhinitis   . Anxiety    claustrophobic  . Colon polyps 2012 and 2013   All hyperplastic except one adenomatous w/out high grade dysplasia (recall 2023 per Dr. Deatra Ina)  . Diabetes mellitus, type 2 ([place]) 09/27/2015   A1c 7.0%   . Elevated blood pressure reading without diagnosis of hypertension   . Elevated transaminase level 2017   Viral hep screening neg.  Suspect fatty liver.  . Hyperlipidemia    No improvement with TLC 2017  . Obesity   . OSA on CPAP    Severe.  Wears CPAP but pt does not know his setting.  (Dr. Melvyn Novas follows him)  . Seborrheic dermatitis of scalp    and face: hydrocort 2% cream bid prn to face, and fluocinonide 0.5% sol'n to scalp bid prn per Dr. Allyson Sabal (04/2014)    Past Surgical History:  Procedure Laterality Date  . COLONOSCOPY W/ POLYPECTOMY  04/2010; 10/2011   Initial screening TCS showed 6 polyps (one adenomatous).  Pt says he returned for repeat TCS 10/2011 showed one hyperplastic polyp: Recall 10 yrs per Dr. Deatra Ina  . MOUTH SURGERY      Outpatient Medications Prior to Visit  Medication Sig Dispense Refill  . albuterol (VENTOLIN HFA) 108 (90 Base) MCG/ACT inhaler Inhale 1-2 puffs into the lungs every 4  (four) hours as needed for wheezing or shortness of breath. 1 Inhaler 0  . atorvastatin (LIPITOR) 40 MG tablet TAKE 1 TABLET BY MOUTH EVERY DAY 30 tablet 5  . metFORMIN (GLUCOPHAGE) 500 MG tablet TAKE 1 TABLET (500 MG TOTAL) BY MOUTH 2 (TWO) TIMES DAILY WITH A MEAL. 60 tablet 5  . methylphenidate (METADATE CD) 20 MG CR capsule Take 1 capsule (20 mg total) by mouth daily as needed. 30 capsule 0   No facility-administered medications prior to visit.     No Known Allergies  ROS As per HPI  PE: Temp initially 98.1. oral.  Recheck 99.0 with temporal thermometer. Blood pressure (!) 142/84, pulse (!) 117, temperature 98.1 F (36.7 C), temperature source Oral, resp. rate 17, height 5' 9.5" (1.765 m), weight 257 lb 8 oz (116.8 kg), SpO2 94 %. Body mass index is 37.48 kg/m.  VS: noted--normal. Gen: alert, NAD, NONTOXIC APPEARING. HEENT: eyes without injection, drainage, or swelling.  Ears: EACs clear, TMs with normal light reflex and landmarks.  Nose: Clear rhinorrhea, with some dried, crusty exudate adherent to mildly injected mucosa.  No purulent d/c.  No paranasal sinus TTP.  No facial swelling.  Throat and mouth without focal lesion.  No pharyngial swelling, erythema, or exudate.   Neck: supple, no LAD.  LUNGS: Insp rhonchi diffusely that clear with cough.  He has coarse exp wheezing that clears with coughing, but some fine, end-exp wheezing remains.  Aeration is fine, exp phase is not prolonged.  Nonlabored resps.   CV: RR, tachy to 115-120, no m/r/g. EXT: no c/c/e SKIN: no rash    LABS:    Chemistry      Component Value Date/Time   NA 138 03/03/2018 1140   K 4.6 03/03/2018 1140   CL 103 03/03/2018 1140   CO2 26 03/03/2018 1140   BUN 10 03/03/2018 1140   CREATININE 0.58 03/03/2018 1140      Component Value Date/Time   CALCIUM 10.1 03/03/2018 1140   ALKPHOS 86 03/03/2018 1140   AST 32 03/03/2018 1140   ALT 32 03/03/2018 1140   BILITOT 0.5 03/03/2018 1140      IMPRESSION  AND PLAN:  Viral URI with acute bronchitis, with RAD. He is comfortable, mildly tachycardic. Prednisone 40mg  qd x 5d, then 20mg  qd x 5d. Continue albut HFA 2p q4h prn. Continue mucinex DM q12h prn. Signs/symptoms to call or return for were reviewed and pt expressed understanding.  An After Visit Summary was printed and given to the patient.  FOLLOW UP: Return if symptoms worsen or fail to improve.  Signed:  Crissie Sickles, MD           04/09/2018

## 2018-06-02 ENCOUNTER — Ambulatory Visit: Payer: 59 | Admitting: Family Medicine

## 2018-06-04 ENCOUNTER — Telehealth: Payer: Self-pay

## 2018-06-04 ENCOUNTER — Ambulatory Visit: Payer: 59 | Admitting: Family Medicine

## 2018-06-04 ENCOUNTER — Other Ambulatory Visit: Payer: Self-pay

## 2018-06-04 ENCOUNTER — Encounter: Payer: Self-pay | Admitting: Family Medicine

## 2018-06-04 VITALS — BP 143/80 | HR 78 | Temp 98.6°F | Resp 16 | Ht 69.5 in | Wt 260.4 lb

## 2018-06-04 DIAGNOSIS — E119 Type 2 diabetes mellitus without complications: Secondary | ICD-10-CM

## 2018-06-04 DIAGNOSIS — F988 Other specified behavioral and emotional disorders with onset usually occurring in childhood and adolescence: Secondary | ICD-10-CM

## 2018-06-04 DIAGNOSIS — E7800 Pure hypercholesterolemia, unspecified: Secondary | ICD-10-CM | POA: Diagnosis not present

## 2018-06-04 DIAGNOSIS — S60222A Contusion of left hand, initial encounter: Secondary | ICD-10-CM | POA: Diagnosis not present

## 2018-06-04 LAB — POCT GLYCOSYLATED HEMOGLOBIN (HGB A1C): Hemoglobin A1C: 6.1 % — AB (ref 4.0–5.6)

## 2018-06-04 MED ORDER — METHYLPHENIDATE HCL ER (CD) 20 MG PO CPCR: 20.0000 mg | ORAL_CAPSULE | Freq: Every day | ORAL | 0 refills | Status: DC | PRN

## 2018-06-04 NOTE — Progress Notes (Signed)
OFFICE VISIT  06/04/2018   CC:  Chief Complaint  Patient presents with  . Follow-up    RCI, pt is fasting   HPI:    Patient is a 63 y.o. Caucasian male who presents for 3 mo f/u adult ADD., DM 2, and HLD.  ADD: Pt states all is going well with the med at current dosing: much improved focus, concentration, task completion.  Less frustration, better multitasking, less impulsivity and restlessness.  Mood is stable. No side effects from the medication.   DM 2: tolerating metformin fine.  No home gluc monitoring. Doing diabetic diet. Walking for exercise.  HLD: tolerating statin.  Large board fell on left hand and forearm about 1 mo ago.  Some swelling still present and has numbness and tenderness on top of left hand.  He has been using the hand normally w/out pain.  ROS: no CP, no SOB, no wheezing, no cough, no dizziness, no HAs, no rashes, no melena/hematochezia.  No polyuria or polydipsia.  No myalgias or arthralgias.   Past Medical History:  Diagnosis Date  . Adult ADHD   . Allergic rhinitis   . Anxiety    claustrophobic  . Colon polyps 2012 and 2013   All hyperplastic except one adenomatous w/out high grade dysplasia (recall 2023 per Dr. Deatra Ina)  . Diabetes mellitus, type 2 ([place]) 09/27/2015   A1c 7.0%   . Elevated blood pressure reading without diagnosis of hypertension   . Elevated transaminase level 2017   Viral hep screening neg.  Suspect fatty liver.  . Hyperlipidemia    No improvement with TLC 2017  . Obesity   . OSA on CPAP    Severe.  Wears CPAP but pt does not know his setting.  (Dr. Melvyn Novas follows him)  . Seborrheic dermatitis of scalp    and face: hydrocort 2% cream bid prn to face, and fluocinonide 0.5% sol'n to scalp bid prn per Dr. Allyson Sabal (04/2014)    Past Surgical History:  Procedure Laterality Date  . COLONOSCOPY W/ POLYPECTOMY  04/2010; 10/2011   Initial screening TCS showed 6 polyps (one adenomatous).  Pt says he returned for repeat TCS 10/2011 showed  one hyperplastic polyp: Recall 10 yrs per Dr. Deatra Ina  . MOUTH SURGERY      Outpatient Medications Prior to Visit  Medication Sig Dispense Refill  . albuterol (VENTOLIN HFA) 108 (90 Base) MCG/ACT inhaler Inhale 1-2 puffs into the lungs every 4 (four) hours as needed for wheezing or shortness of breath. 1 Inhaler 0  . atorvastatin (LIPITOR) 40 MG tablet TAKE 1 TABLET BY MOUTH EVERY DAY 30 tablet 5  . metFORMIN (GLUCOPHAGE) 500 MG tablet TAKE 1 TABLET (500 MG TOTAL) BY MOUTH 2 (TWO) TIMES DAILY WITH A MEAL. 60 tablet 5  . methylphenidate (METADATE CD) 20 MG CR capsule Take 1 capsule (20 mg total) by mouth daily as needed. 30 capsule 0  . predniSONE (DELTASONE) 20 MG tablet 2 tabs po qd x 5d, then 1 tab po qd x 5d (Patient not taking: Reported on 06/04/2018) 15 tablet 0   No facility-administered medications prior to visit.     No Known Allergies  ROS As per HPI  PE: Blood pressure (!) 143/80, pulse 78, temperature 98.6 F (37 C), temperature source Oral, resp. rate 16, height 5' 9.5" (1.765 m), weight 260 lb 6.4 oz (118.1 kg), SpO2 97 %. Body mass index is 37.9 kg/m.  Gen: Alert, well appearing.  Patient is oriented to person, place, time, and situation.  Left hand: mild soft tissue swelling over dorsal aspect of wrist and prox thumb. +Finkelstein's (mild).  Mild TTP over anatomic snuff box.  Sensation intact/normal. ROM of wrist and fingers all normal.  Pulses intact, skin is normal warmth. No erythema.  LABS:  Lab Results  Component Value Date   TSH 1.67 03/03/2018   Lab Results  Component Value Date   WBC 6.4 03/03/2018   HGB 15.6 03/03/2018   HCT 46.4 03/03/2018   MCV 109.3 (H) 03/03/2018   PLT 215.0 03/03/2018   Lab Results  Component Value Date   CREATININE 0.58 03/03/2018   BUN 10 03/03/2018   NA 138 03/03/2018   K 4.6 03/03/2018   CL 103 03/03/2018   CO2 26 03/03/2018   Lab Results  Component Value Date   ALT 32 03/03/2018   AST 32 03/03/2018   ALKPHOS 86  03/03/2018   BILITOT 0.5 03/03/2018   Lab Results  Component Value Date   CHOL 131 03/03/2018   Lab Results  Component Value Date   HDL 59.10 03/03/2018   Lab Results  Component Value Date   LDLCALC 56 03/03/2018   Lab Results  Component Value Date   TRIG 80.0 03/03/2018   Lab Results  Component Value Date   CHOLHDL 2 03/03/2018   Lab Results  Component Value Date   PSA 0.30 03/03/2018   PSA 0.40 02/26/2017   PSA 0.54 03/04/2016   Lab Results  Component Value Date   HGBA1C 6.3 03/03/2018   POC HbA1c today= 6.1%  IMPRESSION AND PLAN:  1) DM: doing great!  HbA1c in office today= 6.1%.  2) HLD: The current medical regimen is effective;  continue present plan and medications. Tolerating statin.  Lipids 02/2018 excellent.  3) Adult ADD: The current medical regimen is effective;  continue present plan and medications. I did electronic rx's for metadate CD 20mg , 1 qd prn, #30 today.  Only 1 rx given b/c he typically only needs this med prn/RF on quarterly basis approximately.   4) Left hand contusion, dorsal aspect: need to r/o scaphoid fx or scapholunate lig prob with x-ray today. Fortunately, he is relatively asymptomatic with this except when the area is pressed on + has some focal numbness over the area.  An After Visit Summary was printed and given to the patient.  FOLLOW UP: Return in about 3 months (around 09/04/2018) for routine chronic illness f/u.  Signed:  Crissie Sickles, MD           06/04/2018

## 2018-06-04 NOTE — Telephone Encounter (Signed)
PA sent via covermymed on 06/04/18   Key: [id number]   Medication: Methylphenidate HCl ER   Dx: F90.9   Per Dr. Anitra Lauth pt has tried and failed none.   Waiting for response.

## 2018-06-08 NOTE — Telephone Encounter (Signed)
PA approved for Methylphenidate on 06/05/18  05/2018-05/2019 #30 w/ 12 RF.

## 2018-06-10 ENCOUNTER — Encounter: Payer: Self-pay | Admitting: Family Medicine

## 2018-07-10 ENCOUNTER — Other Ambulatory Visit: Payer: Self-pay | Admitting: Family Medicine

## 2018-07-31 ENCOUNTER — Other Ambulatory Visit: Payer: Self-pay

## 2018-07-31 ENCOUNTER — Ambulatory Visit (HOSPITAL_BASED_OUTPATIENT_CLINIC_OR_DEPARTMENT_OTHER)
Admission: RE | Admit: 2018-07-31 | Discharge: 2018-07-31 | Disposition: A | Discharge status: Home / Self Care | Payer: 59 | Source: Ambulatory Visit | Attending: Family Medicine | Admitting: Family Medicine

## 2018-07-31 DIAGNOSIS — S60222A Contusion of left hand, initial encounter: Secondary | ICD-10-CM | POA: Diagnosis present

## 2018-08-06 ENCOUNTER — Other Ambulatory Visit: Payer: Self-pay | Admitting: Family Medicine

## 2018-08-06 ENCOUNTER — Other Ambulatory Visit: Payer: Self-pay

## 2018-08-06 MED ORDER — ATORVASTATIN CALCIUM 40 MG PO TABS: 40.0000 mg | ORAL_TABLET | Freq: Every day | ORAL | 1 refills | Status: DC

## 2018-08-07 ENCOUNTER — Other Ambulatory Visit: Payer: Self-pay | Admitting: Family Medicine

## 2018-08-07 NOTE — Telephone Encounter (Signed)
Copied from [place] [id number]. Topic: Quick Communication - Rx Refill/Question >> Aug 07, 2018  3:26 PM Mathis Bud wrote: Medication: atorvastatin (LIPITOR) 40 MG tablet   Has the patient contacted their pharmacy? Yes, no more refills  Preferred Pharmacy (with phone number or street name): [id number] - [place], [place] [phone] (Phone) [phone] (Fax)    Agent: Please be advised that RX refills may take up to 3 business days. We ask that you follow-up with your pharmacy.

## 2018-08-07 NOTE — Telephone Encounter (Signed)
Pt was notified refill has been sent.

## 2018-08-07 NOTE — Telephone Encounter (Signed)
Pt was notified refill sent.

## 2018-08-07 NOTE — Telephone Encounter (Signed)
Copied from [place] [id number]. Topic: Quick Communication - Rx Refill/Question >> Aug 07, 2018  3:27 PM Mathis Bud wrote: Medication: methylphenidate (METADATE CD) 20 MG CR capsule  Has the patient contacted their pharmacy? Yes, no refill  Preferred Pharmacy (with phone number or street name): [id number] - [place], [place] [phone] (Phone) [phone] (Fax)    Agent: Please be advised that RX refills may take up to 3 business days. We ask that you follow-up with your pharmacy.

## 2018-08-13 ENCOUNTER — Other Ambulatory Visit: Payer: Self-pay | Admitting: Family Medicine

## 2018-08-14 MED ORDER — METHYLPHENIDATE HCL ER (CD) 20 MG PO CPCR: 20.0000 mg | ORAL_CAPSULE | Freq: Every day | ORAL | 0 refills | Status: DC | PRN

## 2018-08-14 NOTE — Telephone Encounter (Signed)
RF request for methylphenidate 20mg . Last RF 06/04/2018 # 30, 0 RF.  Last OV 06/04/2018 Next OV 09/04/2018  Please advise.

## 2018-09-04 ENCOUNTER — Other Ambulatory Visit: Payer: Self-pay

## 2018-09-04 ENCOUNTER — Encounter: Payer: Self-pay | Admitting: Family Medicine

## 2018-09-04 ENCOUNTER — Other Ambulatory Visit: Payer: Self-pay | Admitting: Family Medicine

## 2018-09-04 ENCOUNTER — Ambulatory Visit (INDEPENDENT_AMBULATORY_CARE_PROVIDER_SITE_OTHER): Payer: 59 | Admitting: Family Medicine

## 2018-09-04 ENCOUNTER — Other Ambulatory Visit (INDEPENDENT_AMBULATORY_CARE_PROVIDER_SITE_OTHER): Payer: 59

## 2018-09-04 VITALS — BP 161/80 | HR 100 | Temp 98.0°F | Resp 16 | Ht 69.5 in | Wt 262.4 lb

## 2018-09-04 DIAGNOSIS — E7800 Pure hypercholesterolemia, unspecified: Secondary | ICD-10-CM | POA: Diagnosis not present

## 2018-09-04 DIAGNOSIS — Z23 Encounter for immunization: Secondary | ICD-10-CM

## 2018-09-04 DIAGNOSIS — E119 Type 2 diabetes mellitus without complications: Secondary | ICD-10-CM

## 2018-09-04 DIAGNOSIS — F988 Other specified behavioral and emotional disorders with onset usually occurring in childhood and adolescence: Secondary | ICD-10-CM

## 2018-09-04 DIAGNOSIS — I10 Essential (primary) hypertension: Secondary | ICD-10-CM

## 2018-09-04 DIAGNOSIS — Z79899 Other long term (current) drug therapy: Secondary | ICD-10-CM

## 2018-09-04 LAB — POCT GLYCOSYLATED HEMOGLOBIN (HGB A1C): Hemoglobin A1C: 6.2 % — AB (ref 4.0–5.6)

## 2018-09-04 NOTE — Progress Notes (Signed)
OFFICE VISIT  09/04/2018   CC:  Chief Complaint  Patient presents with  . Follow-up    RCI, pt is fasting   HPI:    Patient is a 63 y.o. Caucasian male who presents for 3 mo f/u adult ADD, DM 2, HLD, hx of HTN vs elevated bp w/out dx htn.  Most recent methylphenidate rx filled 08/14/18, #30 per review of PMP aware.  This amount typically lasts him 2-3 mo. Pt states all is going well with the med at current dosing: much improved focus, concentration, task completion.  Less frustration, better multitasking, less impulsivity and restlessness.  Mood is stable. No side effects from the medication.   DM2: no home gluc monitoring.  Compliant with metformin 500 mg bid. Diabetic diet--not really.  HLD: compliant with statin w/out side effects. Diet is fair, exercise-->essentially none.  Elevated bp w/out dx HTN: rare home bp check 140'ish.  Doesn't recall diastolics or HR. Review of past BPs in emr avg 140s/80, hr avg 105.    ROS: no CP, no SOB, no wheezing, no cough, no dizziness, no HAs, no rashes, no melena/hematochezia.  No polyuria or polydipsia.  No myalgias or arthralgias.    Past Medical History:  Diagnosis Date  . Adult ADHD   . Allergic rhinitis   . Anxiety    claustrophobic  . Colon polyps 2012 and 2013   All hyperplastic except one adenomatous w/out high grade dysplasia (recall 2023 per Dr. Deatra Ina)  . Diabetes mellitus, type 2 ([place]) 09/27/2015   A1c 7.0%   . Elevated blood pressure reading without diagnosis of hypertension   . Elevated transaminase level 2017   Viral hep screening neg.  Suspect fatty liver.  . Hyperlipidemia    No improvement with TLC 2017  . Obesity   . OSA on CPAP    Severe.  Wears CPAP but pt does not know his setting.  (Dr. Melvyn Novas follows him)  . Seborrheic dermatitis of scalp    and face: hydrocort 2% cream bid prn to face, and fluocinonide 0.5% sol'n to scalp bid prn per Dr. Allyson Sabal (04/2014)    Past Surgical History:  Procedure Laterality  Date  . COLONOSCOPY W/ POLYPECTOMY  04/2010; 10/2011   Initial screening TCS showed 6 polyps (one adenomatous).  Pt says he returned for repeat TCS 10/2011 showed one hyperplastic polyp: Recall 10 yrs per Dr. Deatra Ina  . MOUTH SURGERY      Outpatient Medications Prior to Visit  Medication Sig Dispense Refill  . albuterol (VENTOLIN HFA) 108 (90 Base) MCG/ACT inhaler Inhale 1-2 puffs into the lungs every 4 (four) hours as needed for wheezing or shortness of breath. 1 Inhaler 0  . atorvastatin (LIPITOR) 40 MG tablet Take 1 tablet (40 mg total) by mouth daily. 30 tablet 1  . metFORMIN (GLUCOPHAGE) 500 MG tablet TAKE 1 TABLET (500 MG TOTAL) BY MOUTH 2 (TWO) TIMES DAILY WITH A MEAL. 60 tablet 1  . methylphenidate (METADATE CD) 20 MG CR capsule Take 1 capsule (20 mg total) by mouth daily as needed. 30 capsule 0   No facility-administered medications prior to visit.     Allergies  Allergen Reactions  . Lobster [Shellfish Allergy] Nausea Only  . Poison Ivy Extract Itching    ROS As per HPI  PE: Blood pressure (!) 161/80, pulse 100, temperature 98 F (36.7 C), temperature source Temporal, resp. rate 16, height 5' 9.5" (1.765 m), weight 262 lb 6.4 oz (119 kg), SpO2 95 %. Gen: Alert, well appearing.  Patient is oriented to person, place, time, and situation. AFFECT: pleasant, lucid thought and speech.   LABS:  Lab Results  Component Value Date   TSH 1.67 03/03/2018   Lab Results  Component Value Date   WBC 6.4 03/03/2018   HGB 15.6 03/03/2018   HCT 46.4 03/03/2018   MCV 109.3 (H) 03/03/2018   PLT 215.0 03/03/2018   Lab Results  Component Value Date   CREATININE 0.58 03/03/2018   BUN 10 03/03/2018   NA 138 03/03/2018   K 4.6 03/03/2018   CL 103 03/03/2018   CO2 26 03/03/2018   Lab Results  Component Value Date   ALT 32 03/03/2018   AST 32 03/03/2018   ALKPHOS 86 03/03/2018   BILITOT 0.5 03/03/2018   Lab Results  Component Value Date   CHOL 131 03/03/2018   Lab Results   Component Value Date   HDL 59.10 03/03/2018   Lab Results  Component Value Date   LDLCALC 56 03/03/2018   Lab Results  Component Value Date   TRIG 80.0 03/03/2018   Lab Results  Component Value Date   CHOLHDL 2 03/03/2018   Lab Results  Component Value Date   PSA 0.30 03/03/2018   PSA 0.40 02/26/2017   PSA 0.54 03/04/2016   Lab Results  Component Value Date   HGBA1C 6.1 (A) 06/04/2018   POC HBA1c today= 6.2%  IMPRESSION AND PLAN:  1) HTN: new dx, although he's had this for a while and has been in denial for the most part.  He wants to decline bp med at this time and work on diet, exercise, wt loss. Will obtain lytes/cr at next f/u visit in 6 mo.  2) DM 2: compliant with med.   Great A1c last visit. POC A1c today: 6.2%. No changes recommended.  3) Adult ADD: The current medical regimen is effective;  continue present plan and medications. CSC 09/11/17--will update this at next f/u visit in 6 mo. UDS today (last metadate dose was 2 d/a or so). He does not need metadate rx today.  4) HLD: tolerating statin.  Not doing well with exercise, diet is fair. Will repeat hepatic panel and FLP 6 mo.  An After Visit Summary was printed and given to the patient.  FOLLOW UP: Return in about 6 months (around 03/06/2019) for annual CPE (fasting).  Signed:  Crissie Sickles, MD           09/04/2018

## 2018-09-04 NOTE — Patient Instructions (Signed)
Check your blood pressure and heart rate 2 to 3 times per week and write these numbers down to review with me at your next visit in 6 mo.    DASH Eating Plan DASH stands for "Dietary Approaches to Stop Hypertension." The DASH eating plan is a healthy eating plan that has been shown to reduce high blood pressure (hypertension). It may also reduce your risk for type 2 diabetes, heart disease, and stroke. The DASH eating plan may also help with weight loss. What are tips for following this plan?  General guidelines  Avoid eating more than 2,300 mg (milligrams) of salt (sodium) a day. If you have hypertension, you may need to reduce your sodium intake to 1,500 mg a day.  Limit alcohol intake to no more than 1 drink a day for nonpregnant women and 2 drinks a day for men. One drink equals 12 oz of beer, 5 oz of wine, or 1 oz of hard liquor.  Work with your health care provider to maintain a healthy body weight or to lose weight. Ask what an ideal weight is for you.  Get at least 30 minutes of exercise that causes your heart to beat faster (aerobic exercise) most days of the week. Activities may include walking, swimming, or biking.  Work with your health care provider or diet and nutrition specialist (dietitian) to adjust your eating plan to your individual calorie needs. Reading food labels   Check food labels for the amount of sodium per serving. Choose foods with less than 5 percent of the Daily Value of sodium. Generally, foods with less than 300 mg of sodium per serving fit into this eating plan.  To find whole grains, look for the word "whole" as the first word in the ingredient list. Shopping  Buy products labeled as "low-sodium" or "no salt added."  Buy fresh foods. Avoid canned foods and premade or frozen meals. Cooking  Avoid adding salt when cooking. Use salt-free seasonings or herbs instead of table salt or sea salt. Check with your health care provider or pharmacist before  using salt substitutes.  Do not fry foods. Cook foods using healthy methods such as baking, boiling, grilling, and broiling instead.  Cook with heart-healthy oils, such as olive, canola, soybean, or sunflower oil. Meal planning  Eat a balanced diet that includes: ? 5 or more servings of fruits and vegetables each day. At each meal, try to fill half of your plate with fruits and vegetables. ? Up to 6-8 servings of whole grains each day. ? Less than 6 oz of lean meat, poultry, or fish each day. A 3-oz serving of meat is about the same size as a deck of cards. One egg equals 1 oz. ? 2 servings of low-fat dairy each day. ? A serving of nuts, seeds, or beans 5 times each week. ? Heart-healthy fats. Healthy fats called Omega-3 fatty acids are found in foods such as flaxseeds and coldwater fish, like sardines, salmon, and mackerel.  Limit how much you eat of the following: ? Canned or prepackaged foods. ? Food that is high in trans fat, such as fried foods. ? Food that is high in saturated fat, such as fatty meat. ? Sweets, desserts, sugary drinks, and other foods with added sugar. ? Full-fat dairy products.  Do not salt foods before eating.  Try to eat at least 2 vegetarian meals each week.  Eat more home-cooked food and less restaurant, buffet, and fast food.  When eating at a restaurant,  ask that your food be prepared with less salt or no salt, if possible. What foods are recommended? The items listed may not be a complete list. Talk with your dietitian about what dietary choices are best for you. Grains Whole-grain or whole-wheat bread. Whole-grain or whole-wheat pasta. Brown rice. [place]. Bulgur. Whole-grain and low-sodium cereals. Pita bread. Low-fat, low-sodium crackers. Whole-wheat flour tortillas. Vegetables Fresh or frozen vegetables (raw, steamed, roasted, or grilled). Low-sodium or reduced-sodium tomato and vegetable juice. Low-sodium or reduced-sodium tomato sauce and  tomato paste. Low-sodium or reduced-sodium canned vegetables. Fruits All fresh, dried, or frozen fruit. Canned fruit in natural juice (without added sugar). Meat and other protein foods Skinless chicken or [place]. Ground chicken or [place]. Pork with fat trimmed off. Fish and seafood. Egg whites. Dried beans, peas, or lentils. Unsalted nuts, nut butters, and seeds. Unsalted canned beans. Lean cuts of beef with fat trimmed off. Low-sodium, lean deli meat. Dairy Low-fat (1%) or fat-free (skim) milk. Fat-free, low-fat, or reduced-fat cheeses. Nonfat, low-sodium ricotta or cottage cheese. Low-fat or nonfat yogurt. Low-fat, low-sodium cheese. Fats and oils Soft margarine without trans fats. Vegetable oil. Low-fat, reduced-fat, or light mayonnaise and salad dressings (reduced-sodium). Canola, safflower, olive, soybean, and sunflower oils. Avocado. Seasoning and other foods Herbs. Spices. Seasoning mixes without salt. Unsalted popcorn and pretzels. Fat-free sweets. What foods are not recommended? The items listed may not be a complete list. Talk with your dietitian about what dietary choices are best for you. Grains Baked goods made with fat, such as croissants, muffins, or some breads. Dry pasta or rice meal packs. Vegetables Creamed or fried vegetables. Vegetables in a cheese sauce. Regular canned vegetables (not low-sodium or reduced-sodium). Regular canned tomato sauce and paste (not low-sodium or reduced-sodium). Regular tomato and vegetable juice (not low-sodium or reduced-sodium). Angie Fava. Olives. Fruits Canned fruit in a light or heavy syrup. Fried fruit. Fruit in cream or butter sauce. Meat and other protein foods Fatty cuts of meat. Ribs. Fried meat. Berniece Salines. Sausage. Bologna and other processed lunch meats. Salami. Fatback. Hotdogs. Bratwurst. Salted nuts and seeds. Canned beans with added salt. Canned or smoked fish. Whole eggs or egg yolks. Chicken or [place] with skin. Dairy Whole or 2%  milk, cream, and half-and-half. Whole or full-fat cream cheese. Whole-fat or sweetened yogurt. Full-fat cheese. Nondairy creamers. Whipped toppings. Processed cheese and cheese spreads. Fats and oils Butter. Stick margarine. Lard. Shortening. Ghee. Bacon fat. Tropical oils, such as coconut, palm kernel, or palm oil. Seasoning and other foods Salted popcorn and pretzels. Onion salt, garlic salt, seasoned salt, table salt, and sea salt. Worcestershire sauce. Tartar sauce. Barbecue sauce. Teriyaki sauce. Soy sauce, including reduced-sodium. Steak sauce. Canned and packaged gravies. Fish sauce. Oyster sauce. Cocktail sauce. Horseradish that you find on the shelf. Ketchup. Mustard. Meat flavorings and tenderizers. Bouillon cubes. Hot sauce and Tabasco sauce. Premade or packaged marinades. Premade or packaged taco seasonings. Relishes. Regular salad dressings. Where to find more information:  National Heart, Lung, and [place]: https://wilson-eaton.com/  American Heart Association: www.heart.org Summary  The DASH eating plan is a healthy eating plan that has been shown to reduce high blood pressure (hypertension). It may also reduce your risk for type 2 diabetes, heart disease, and stroke.  With the DASH eating plan, you should limit salt (sodium) intake to 2,300 mg a day. If you have hypertension, you may need to reduce your sodium intake to 1,500 mg a day.  When on the DASH eating plan, aim to eat more fresh  fruits and vegetables, whole grains, lean proteins, low-fat dairy, and heart-healthy fats.  Work with your health care provider or diet and nutrition specialist (dietitian) to adjust your eating plan to your individual calorie needs. This information is not intended to replace advice given to you by your health care provider. Make sure you discuss any questions you have with your health care provider. Document Released: 02/21/2011 Document Revised: 02/26/2016 Document Reviewed: 02/26/2016  Elsevier Interactive Patient Education  2019 [employer].

## 2018-09-06 LAB — PAIN MGMT, PROFILE 8 W/CONF, U
6 Acetylmorphine: NEGATIVE ng/mL
Alcohol Metabolites: POSITIVE ng/mL — AB (ref <500)
Amphetamines: NEGATIVE ng/mL
Benzodiazepines: NEGATIVE ng/mL
Buprenorphine, Urine: NEGATIVE ng/mL
Cocaine Metabolite: NEGATIVE ng/mL
Creatinine: 106.2 mg/dL
Ethyl Glucuronide (ETG): 93570 ng/mL
Ethyl Sulfate (ETS): 13532 ng/mL
MDMA: NEGATIVE ng/mL
Marijuana Metabolite: NEGATIVE ng/mL
Opiates: NEGATIVE ng/mL
Oxidant: NEGATIVE ug/mL
Oxycodone: NEGATIVE ng/mL
pH: 5.2 (ref 4.5–9.0)

## 2018-09-07 ENCOUNTER — Telehealth: Payer: Self-pay

## 2018-09-07 NOTE — Telephone Encounter (Signed)
My Chart message sent

## 2018-10-19 ENCOUNTER — Telehealth: Payer: Self-pay | Admitting: Family Medicine

## 2018-10-19 MED ORDER — METHYLPHENIDATE HCL ER (CD) 20 MG PO CPCR: 20.0000 mg | ORAL_CAPSULE | Freq: Every day | ORAL | 0 refills | Status: DC | PRN

## 2018-10-19 NOTE — Telephone Encounter (Signed)
metadate cd eRx'd.

## 2018-10-19 NOTE — Telephone Encounter (Signed)
RF request for Methylphenidate LOV: 09/14/18 Next ov: advised to f/u 6 mo. Last written: 08/14/18 (30,0) Last CSC: 09/11/17 w/ no UDS  Please advise, thanks.

## 2018-10-19 NOTE — Telephone Encounter (Signed)
Copied from [place] [id number]. Topic: Quick Communication - Rx Refill/Question >> Oct 19, 2018  3:49 PM Erick Blinks wrote: Medication: methylphenidate (METADATE CD) 20 MG CR capsule  Has the patient contacted their pharmacy? Yes.   (Agent: If no, request that the patient contact the pharmacy for the refill.) (Agent: If yes, when and what did the pharmacy advise?)  Preferred Pharmacy (with phone number or street name): [id number] - [place], [place] [address] [place]  [address] Phone: [phone] Fax: [phone]    Agent: Please be advised that RX refills may take up to 3 business days. We ask that you follow-up with your pharmacy.

## 2018-10-20 NOTE — Telephone Encounter (Signed)
MyChart message read.

## 2018-12-07 ENCOUNTER — Other Ambulatory Visit: Payer: Self-pay | Admitting: Family Medicine

## 2018-12-07 NOTE — Telephone Encounter (Signed)
Patient requests methylphenidate (METADATE CD) 20 MG CR capsule sent to [facility]

## 2018-12-07 NOTE — Telephone Encounter (Signed)
RF request for Methylphenidate LOV: 09/04/18 Next ov: advised to f/u Dec for CPE Last written: 10/19/18 (30,0) Last CSC: 08/2717 w/ UDS: 09/04/18  Please advise, thanks. Medication pending

## 2018-12-08 MED ORDER — METHYLPHENIDATE HCL ER (CD) 20 MG PO CPCR: 20.0000 mg | ORAL_CAPSULE | Freq: Every day | ORAL | 0 refills | Status: DC | PRN

## 2019-01-15 ENCOUNTER — Other Ambulatory Visit: Payer: Self-pay

## 2019-01-15 ENCOUNTER — Ambulatory Visit (INDEPENDENT_AMBULATORY_CARE_PROVIDER_SITE_OTHER): Payer: 59

## 2019-01-15 DIAGNOSIS — Z23 Encounter for immunization: Secondary | ICD-10-CM | POA: Diagnosis not present

## 2019-01-25 ENCOUNTER — Other Ambulatory Visit: Payer: Self-pay | Admitting: Family Medicine

## 2019-01-25 MED ORDER — METHYLPHENIDATE HCL ER (CD) 20 MG PO CPCR: 20.0000 mg | ORAL_CAPSULE | Freq: Every day | ORAL | 0 refills | Status: DC | PRN

## 2019-01-25 NOTE — Telephone Encounter (Signed)
Requesting: Metadate Contract: 09/11/17 UDS: 09/04/18 Last Visit: 09/04/18 Next Visit: advised to f/u 6 mo. Last Refill: 12/08/18 (30,0)  Please Advise. Medication pending

## 2019-01-25 NOTE — Telephone Encounter (Signed)
Patient is requesting refill for methylphenidate (METADATE CD) 20 MG CR capsule to be sent to [facility].

## 2019-03-02 ENCOUNTER — Other Ambulatory Visit: Payer: Self-pay | Admitting: Family Medicine

## 2019-03-02 NOTE — Telephone Encounter (Signed)
Requesting: methylphenidate Contract:09/11/17 UDS:09/04/18 Last Visit:09/04/18 Next Visit:03/10/19 Last Refill:01/25/19 (30,0)  Please Advise. Medication pending

## 2019-03-03 MED ORDER — METHYLPHENIDATE HCL ER (CD) 20 MG PO CPCR: 20.0000 mg | ORAL_CAPSULE | Freq: Every day | ORAL | 0 refills | Status: DC | PRN

## 2019-03-08 ENCOUNTER — Other Ambulatory Visit: Payer: Self-pay | Admitting: Family Medicine

## 2019-03-10 ENCOUNTER — Encounter: Payer: Self-pay | Admitting: Family Medicine

## 2019-03-10 ENCOUNTER — Ambulatory Visit (INDEPENDENT_AMBULATORY_CARE_PROVIDER_SITE_OTHER): Payer: 59 | Admitting: Family Medicine

## 2019-03-10 ENCOUNTER — Other Ambulatory Visit: Payer: Self-pay

## 2019-03-10 VITALS — BP 132/82 | HR 99 | Temp 98.8°F | Resp 16 | Ht 69.5 in | Wt 264.4 lb

## 2019-03-10 DIAGNOSIS — E119 Type 2 diabetes mellitus without complications: Secondary | ICD-10-CM

## 2019-03-10 DIAGNOSIS — F909 Attention-deficit hyperactivity disorder, unspecified type: Secondary | ICD-10-CM

## 2019-03-10 DIAGNOSIS — R030 Elevated blood-pressure reading, without diagnosis of hypertension: Secondary | ICD-10-CM

## 2019-03-10 DIAGNOSIS — Z125 Encounter for screening for malignant neoplasm of prostate: Secondary | ICD-10-CM | POA: Diagnosis not present

## 2019-03-10 DIAGNOSIS — Z0000 Encounter for general adult medical examination without abnormal findings: Secondary | ICD-10-CM | POA: Diagnosis not present

## 2019-03-10 DIAGNOSIS — E7800 Pure hypercholesterolemia, unspecified: Secondary | ICD-10-CM

## 2019-03-10 MED ORDER — METHYLPHENIDATE HCL ER (CD) 20 MG PO CPCR: 20.0000 mg | ORAL_CAPSULE | Freq: Every day | ORAL | 0 refills | Status: DC | PRN

## 2019-03-10 NOTE — Progress Notes (Signed)
Office Note 03/10/2019  CC:  Chief Complaint  Patient presents with  . Annual Exam    pt is fasting    HPI:  Richard [name] is a 63 y.o. White male who is here for annual health maintenance exam.  Just had to put a cat down, is sad about this obviously.  Pt states all is going well with the med at current dosing: much improved focus, concentration, task completion.  Less frustration, better multitasking, less impulsivity and restlessness.  Mood is stable. No side effects from the medication.  He is taking the med more now that his wife and him are at home, more distracted.  Taking it avg of 5 d/week now. PMP AWARE reviewed today: most recent rx for metadate CD was filled 03/03/19, # 10, rx by me. No red flags.  DM: No home glucose checks.  Diet is fair. Hx of elev bp: No home bp measurements like we had planned after last visit.  Left knee "twisted" a couple months ago stepping on something in his house.  Gradually getting better. Takes ibup occ and this helps.  No buckling or popping or locking up.  Pain mild at most.  Past Medical History:  Diagnosis Date  . Adult ADHD   . Allergic rhinitis   . Anxiety    claustrophobic  . Colon polyps 2012 and 2013   All hyperplastic except one adenomatous w/out high grade dysplasia (recall 2023 per Dr. Deatra Ina)  . Diabetes mellitus, type 2 ([place]) 09/27/2015   A1c 7.0%   . Elevated blood pressure reading without diagnosis of hypertension   . Elevated transaminase level 2017   Viral hep screening neg.  Suspect fatty liver.  . Hyperlipidemia    No improvement with TLC 2017  . Obesity   . OSA on CPAP    Severe.  Wears CPAP but pt does not know his setting.  (Dr. Melvyn Novas follows him)  . Seborrheic dermatitis of scalp    and face: hydrocort 2% cream bid prn to face, and fluocinonide 0.5% sol'n to scalp bid prn per Dr. Allyson Sabal (04/2014)    Past Surgical History:  Procedure Laterality Date  . COLONOSCOPY W/ POLYPECTOMY  04/2010; 10/2011    Initial screening TCS showed 6 polyps (one adenomatous).  Pt says he returned for repeat TCS 10/2011 showed one hyperplastic polyp: Recall 10 yrs per Dr. Deatra Ina  . MOUTH SURGERY      Family History  Problem Relation Age of Onset  . Lung cancer Father   . Cancer Father        nasal  . Allergies Father   . Colon cancer Neg Hx   . Esophageal cancer Neg Hx   . Rectal cancer Neg Hx   . Stomach cancer Neg Hx     Social History   Socioeconomic History  . Marital status: Married    Spouse name: Not on file  . Number of children: Not on file  . Years of education: Not on file  . Highest education level: Not on file  Occupational History  . Occupation: Counselling psychologist  Tobacco Use  . Smoking status: Former Smoker    Packs/day: 1.00    Types: Cigarettes    Quit date: 07/17/2002    Years since quitting: 16.6  . Smokeless tobacco: Never Used  Substance and Sexual Activity  . Alcohol use: Yes    Alcohol/week: 7.0 standard drinks    Types: 7 Standard drinks or equivalent per week    Comment:  wine every night with dinner  . Drug use: No  . Sexual activity: Not on file  Other Topics Concern  . Not on file  Social History Narrative   Married, 1 child--grown.   Occupation: Financial planner"   Tobacco: 20 pack-yr hx; quit 2004.   Alcohol: nightly cocktail   Drugs: none   Exercise: "walk some when I can".   Diet: normal.         Social Determinants of Health   Financial Resource Strain:   . Difficulty of Paying Living Expenses: Not on file  Food Insecurity:   . Worried About Charity fundraiser in the Last Year: Not on file  . Ran Out of Food in the Last Year: Not on file  Transportation Needs:   . Lack of Transportation (Medical): Not on file  . Lack of Transportation (Non-Medical): Not on file  Physical Activity:   . Days of Exercise per Week: Not on file  . Minutes of Exercise per Session: Not on file  Stress:   . Feeling of Stress : Not on file  Social Connections:    . Frequency of Communication with Friends and Family: Not on file  . Frequency of Social Gatherings with Friends and Family: Not on file  . Attends Religious Services: Not on file  . Active Member of Clubs or Organizations: Not on file  . Attends Archivist Meetings: Not on file  . Marital Status: Not on file  Intimate Partner Violence:   . Fear of Current or Ex-Partner: Not on file  . Emotionally Abused: Not on file  . Physically Abused: Not on file  . Sexually Abused: Not on file    Outpatient Medications Prior to Visit  Medication Sig Dispense Refill  . albuterol (VENTOLIN HFA) 108 (90 Base) MCG/ACT inhaler Inhale 1-2 puffs into the lungs every 4 (four) hours as needed for wheezing or shortness of breath. 1 Inhaler 0  . atorvastatin (LIPITOR) 40 MG tablet TAKE 1 TABLET BY MOUTH EVERY DAY 90 tablet 0  . metFORMIN (GLUCOPHAGE) 500 MG tablet TAKE 1 TABLET (500 MG TOTAL) BY MOUTH 2 (TWO) TIMES DAILY WITH A MEAL. 180 tablet 0  . methylphenidate (METADATE CD) 20 MG CR capsule Take 1 capsule (20 mg total) by mouth daily as needed. 30 capsule 0   No facility-administered medications prior to visit.    Allergies  Allergen Reactions  . Lobster [Shellfish Allergy] Nausea Only  . Poison Ivy Extract Itching    ROS Review of Systems  Constitutional: Negative for appetite change, chills, fatigue and fever.  HENT: Negative for congestion, dental problem, ear pain and sore throat.   Eyes: Negative for discharge, redness and visual disturbance.  Respiratory: Negative for cough, chest tightness, shortness of breath and wheezing.   Cardiovascular: Negative for chest pain, palpitations and leg swelling.  Gastrointestinal: Negative for abdominal pain, blood in stool, diarrhea, nausea and vomiting.  Genitourinary: Negative for difficulty urinating, dysuria, flank pain, frequency, hematuria and urgency.  Musculoskeletal: Positive for arthralgias (left knee pain). Negative for back pain,  joint swelling, myalgias and neck stiffness.  Skin: Negative for pallor and rash.  Neurological: Negative for dizziness, speech difficulty, weakness and headaches.  Hematological: Negative for adenopathy. Does not bruise/bleed easily.  Psychiatric/Behavioral: Negative for confusion and sleep disturbance. The patient is not nervous/anxious.     PE; Blood pressure 132/82, pulse 99, temperature 98.8 F (37.1 C), temperature source Temporal, resp. rate 16, height 5' 9.5" (1.765 m), weight 264 lb  6.4 oz (119.9 kg), SpO2 96 %. Body mass index is 38.49 kg/m.  Gen: Alert, well appearing.  Patient is oriented to person, place, time, and situation. AFFECT: pleasant, lucid thought and speech. ENT: Ears: EACs clear, normal epithelium.  TMs with good light reflex and landmarks bilaterally.  Eyes: no injection, icteris, swelling, or exudate.  EOMI, PERRLA. Nose: no drainage or turbinate edema/swelling.  No injection or focal lesion.  Mouth: lips without lesion/swelling.  Oral mucosa pink and moist.  Dentition intact and without obvious caries or gingival swelling.  Oropharynx without erythema, exudate, or swelling.  Neck: supple/nontender.  No LAD, mass, or TM.  Carotid pulses 2+ bilaterally, without bruits. CV: RRR, no m/r/g.   LUNGS: CTA bilat, nonlabored resps, good aeration in all lung fields. ABD: soft, NT, ND, BS normal.  No hepatospenomegaly or mass.  No bruits. EXT: no clubbing, cyanosis, or edema.  Some brauny pretibial skin changesR>L w/out pitting,  Left knee with full ROM, no swelling, no erythema, no instability.  Mild TTP over Med collat ligament.  No laxity varus or valgus. Musculoskeletal: no joint swelling, erythema, warmth, or tenderness.  ROM of all joints intact. Skin - no sores or suspicious lesions or rashes or color changes   Pertinent labs:  Lab Results  Component Value Date   TSH 1.67 03/03/2018   Lab Results  Component Value Date   WBC 6.4 03/03/2018   HGB 15.6  03/03/2018   HCT 46.4 03/03/2018   MCV 109.3 (H) 03/03/2018   PLT 215.0 03/03/2018   Lab Results  Component Value Date   CREATININE 0.58 03/03/2018   BUN 10 03/03/2018   NA 138 03/03/2018   K 4.6 03/03/2018   CL 103 03/03/2018   CO2 26 03/03/2018   Lab Results  Component Value Date   ALT 32 03/03/2018   AST 32 03/03/2018   ALKPHOS 86 03/03/2018   BILITOT 0.5 03/03/2018   Lab Results  Component Value Date   CHOL 131 03/03/2018   Lab Results  Component Value Date   HDL 59.10 03/03/2018   Lab Results  Component Value Date   LDLCALC 56 03/03/2018   Lab Results  Component Value Date   TRIG 80.0 03/03/2018   Lab Results  Component Value Date   CHOLHDL 2 03/03/2018   Lab Results  Component Value Date   PSA 0.30 03/03/2018   PSA 0.40 02/26/2017   PSA 0.54 03/04/2016    Lab Results  Component Value Date   HGBA1C 6.2 (A) 09/04/2018    ASSESSMENT AND PLAN:   1) Adult ADD: using metadate more frequently b/c of home/work circumstances interfering with focus. Sent in [place] CD 20mg , 1 every day prn, #30 today, fill on or after 04/02/19.  2) Med collat ligament strain, L knee: continue ROM, strengthening, and prn NSAID.  3) Health maintenance exam: Reviewed age and gender appropriate health maintenance issues (prudent diet, regular exercise, health risks of tobacco and excessive alcohol, use of seatbelts, fire alarms in home, use of sunscreen).  Also reviewed age and gender appropriate health screening as well as vaccine recommendations. Vaccines:All UTD. Labs: HP + HbA1c (DM) and PSA. Prostate ca screening:  PSA. Colon ca screening: recall 2023.  An After Visit Summary was printed and given to the patient.  FOLLOW UP:  Return in about 2 weeks (around 03/24/2019) for telemed f/u High blood pressure.  He is to check bp and hr daily x 2 wks and discuss at next visit in 2 wks.  Signed:  Crissie Sickles, MD           03/10/2019

## 2019-03-12 LAB — LIPID PANEL
Cholesterol: 121 mg/dL (ref <200)
HDL: 52 mg/dL (ref >=40)
LDL Cholesterol (Calc): 52 mg/dL (calc)
Non-HDL Cholesterol (Calc): 69 mg/dL (calc) (ref <130)
Total CHOL/HDL Ratio: 2.3 (calc) (ref <5.0)
Triglycerides: 86 mg/dL (ref <150)

## 2019-03-12 LAB — COMPREHENSIVE METABOLIC PANEL
AG Ratio: 1.8 (calc) (ref 1.0–2.5)
ALT: 38 U/L (ref 9–46)
AST: 55 U/L — ABNORMAL HIGH (ref 10–35)
Albumin: 4.4 g/dL (ref 3.6–5.1)
Alkaline phosphatase (APISO): 100 U/L (ref 35–144)
BUN/Creatinine Ratio: 19 (calc) (ref 6–22)
BUN: 12 mg/dL (ref 7–25)
CO2: 20 mmol/L (ref 20–32)
Calcium: 9.8 mg/dL (ref 8.6–10.3)
Chloride: 103 mmol/L (ref 98–110)
Creat: 0.63 mg/dL — ABNORMAL LOW (ref 0.70–1.25)
Globulin: 2.5 g/dL (calc) (ref 1.9–3.7)
Glucose, Bld: 133 mg/dL — ABNORMAL HIGH (ref 65–99)
Potassium: 4.4 mmol/L (ref 3.5–5.3)
Sodium: 137 mmol/L (ref 135–146)
Total Bilirubin: 0.6 mg/dL (ref 0.2–1.2)
Total Protein: 6.9 g/dL (ref 6.1–8.1)

## 2019-03-12 LAB — CBC WITH DIFFERENTIAL/PLATELET
Absolute Monocytes: 705 cells/uL (ref 200–950)
Basophils Absolute: 41 cells/uL (ref 0–200)
Basophils Relative: 0.5 %
Eosinophils Absolute: 259 cells/uL (ref 15–500)
Eosinophils Relative: 3.2 %
HCT: 41.7 % (ref 38.5–50.0)
Hemoglobin: 14.6 g/dL (ref 13.2–17.1)
Lymphs Abs: 1685 cells/uL (ref 850–3900)
MCH: 36.3 pg — ABNORMAL HIGH (ref 27.0–33.0)
MCHC: 35 g/dL (ref 32.0–36.0)
MCV: 103.7 fL — ABNORMAL HIGH (ref 80.0–100.0)
MPV: 10.5 fL (ref 7.5–12.5)
Monocytes Relative: 8.7 %
Neutro Abs: 5411 cells/uL (ref 1500–7800)
Neutrophils Relative %: 66.8 %
Platelets: 201 10*3/uL (ref 140–400)
RBC: 4.02 10*6/uL — ABNORMAL LOW (ref 4.20–5.80)
RDW: 13 % (ref 11.0–15.0)
Total Lymphocyte: 20.8 %
WBC: 8.1 10*3/uL (ref 3.8–10.8)

## 2019-03-12 LAB — TSH: TSH: 2.58 mIU/L (ref 0.40–4.50)

## 2019-03-12 LAB — TEST AUTHORIZATION

## 2019-03-12 LAB — HEMOGLOBIN A1C W/OUT EAG: Hgb A1c MFr Bld: 6.8 % of total Hgb — ABNORMAL HIGH (ref <5.7)

## 2019-03-12 LAB — PSA: PSA: 0.2 ng/mL (ref <=4.0)

## 2019-03-12 LAB — MICROALBUMIN / CREATININE URINE RATIO
Creatinine, Urine: 73 mg/dL (ref 20–320)
Microalb Creat Ratio: 127 mcg/mg creat — ABNORMAL HIGH (ref <30)
Microalb, Ur: 9.3 mg/dL

## 2019-03-14 ENCOUNTER — Encounter: Payer: Self-pay | Admitting: Family Medicine

## 2019-04-19 ENCOUNTER — Encounter (HOSPITAL_BASED_OUTPATIENT_CLINIC_OR_DEPARTMENT_OTHER): Payer: Self-pay | Admitting: *Deleted

## 2019-04-19 ENCOUNTER — Emergency Department (HOSPITAL_BASED_OUTPATIENT_CLINIC_OR_DEPARTMENT_OTHER)
Admission: EM | Admit: 2019-04-19 | Discharge: 2019-04-19 | Disposition: A | Discharge status: Home / Self Care | Payer: 59 | Attending: Emergency Medicine | Admitting: Emergency Medicine

## 2019-04-19 ENCOUNTER — Other Ambulatory Visit: Payer: Self-pay

## 2019-04-19 DIAGNOSIS — Z79899 Other long term (current) drug therapy: Secondary | ICD-10-CM | POA: Insufficient documentation

## 2019-04-19 DIAGNOSIS — R102 Pelvic and perineal pain: Secondary | ICD-10-CM | POA: Diagnosis present

## 2019-04-19 DIAGNOSIS — Z87891 Personal history of nicotine dependence: Secondary | ICD-10-CM | POA: Diagnosis not present

## 2019-04-19 DIAGNOSIS — L02215 Cutaneous abscess of perineum: Secondary | ICD-10-CM | POA: Insufficient documentation

## 2019-04-19 DIAGNOSIS — E119 Type 2 diabetes mellitus without complications: Secondary | ICD-10-CM | POA: Diagnosis not present

## 2019-04-19 MED ORDER — CEPHALEXIN 500 MG PO CAPS: 500.0000 mg | ORAL_CAPSULE | Freq: Four times a day (QID) | ORAL | 0 refills | Status: DC

## 2019-04-19 MED ORDER — SULFAMETHOXAZOLE-TRIMETHOPRIM 800-160 MG PO TABS: 1.0000 | ORAL_TABLET | Freq: Two times a day (BID) | ORAL | 0 refills | Status: AC

## 2019-04-19 NOTE — ED Triage Notes (Signed)
C/o scrotum/ rectum x 3 days

## 2019-04-19 NOTE — ED Provider Notes (Signed)
[facility] EMERGENCY DEPARTMENT Provider Note   CSN: [id number] Arrival date & time: 04/19/19  1628     History Chief Complaint  Patient presents with  . Abscess    Richard [name] is a 64 y.o. male.  Patient is a 64 year old male with past medical history of hyperlipidemia, type 2 diabetes.  He presents today for evaluation of pain and swelling in the area between his testicles and rectum for the past 2 days.  This afternoon he was walking when he felt a wet sensation in his underwear.  He looked and noted there was blood.  This continued and he presents for evaluation of it.  He denies any fevers or chills.  He denies any trouble with defecation or urination.  The history is provided by the patient.  Abscess Abscess quality: draining, induration and painful   Progression:  Improving Pain details:    Severity:  Moderate Chronicity:  New      Past Medical History:  Diagnosis Date  . Adult ADHD   . Allergic rhinitis   . Anxiety    claustrophobic  . Colon polyps 2012 and 2013   All hyperplastic except one adenomatous w/out high grade dysplasia (recall 2023 per Dr. Deatra Ina)  . Elevated blood pressure reading without diagnosis of hypertension   . Elevated transaminase level 2017   Viral hep screening neg.  Suspect fatty liver.  . Hyperlipidemia    No improvement with TLC 2017  . Obesity   . OSA on CPAP    Severe.  Wears CPAP but pt does not know his setting.  (Dr. Melvyn Novas follows him)  . Seborrheic dermatitis of scalp    and face: hydrocort 2% cream bid prn to face, and fluocinonide 0.5% sol'n to scalp bid prn per Dr. Allyson Sabal (04/2014)  . Type 2 diabetes mellitus with microalbuminuria ([facility]) DM dx-09/27/2015. Microalb 2020    Patient Active Problem List   Diagnosis Date Noted  . IFG (impaired fasting glucose) 09/27/2015  . Borderline hypertension 09/27/2015  . Lateral epicondylitis of left elbow 01/27/2015  . Fracture of toe, closed 05/18/2014  . Seborrheic  dermatitis 02/28/2014  . Subacromial bursitis 11/09/2013  . Bronchopneumonia 11/03/2013  . Cerumen impaction 11/03/2013  . Posterior interosseous nerve syndrome 10/25/2013  . Hearing impairment 10/04/2013  . Right shoulder pain 10/04/2013  . Health maintenance examination 02/26/2013  . Hyperlipidemia 06/15/2011  . SEVERE OBSTRUCTIVE SLEEP APNEA with AHI 94 03/30/2010  . Morbid obesity ([place]) 02/06/2010  . Adult ADHD 03/07/2009  . ALLERGIC RHINITIS 03/06/2009  . DERMATITIS 03/06/2009    Past Surgical History:  Procedure Laterality Date  . COLONOSCOPY W/ POLYPECTOMY  04/2010; 10/2011   Initial screening TCS showed 6 polyps (one adenomatous).  Pt says he returned for repeat TCS 10/2011 showed one hyperplastic polyp: Recall 10 yrs per Dr. Deatra Ina  . MOUTH SURGERY         Family History  Problem Relation Age of Onset  . Lung cancer Father   . Cancer Father        nasal  . Allergies Father   . Colon cancer Neg Hx   . Esophageal cancer Neg Hx   . Rectal cancer Neg Hx   . Stomach cancer Neg Hx     Social History   Tobacco Use  . Smoking status: Former Smoker    Packs/day: 1.00    Types: Cigarettes    Quit date: 07/17/2002    Years since quitting: 16.7  . Smokeless tobacco: Never  Used  Substance Use Topics  . Alcohol use: Yes    Alcohol/week: 7.0 standard drinks    Types: 7 Standard drinks or equivalent per week    Comment: wine every night with dinner  . Drug use: No    Home Medications Prior to Admission medications   Medication Sig Start Date End Date Taking? Authorizing Provider  albuterol (VENTOLIN HFA) 108 (90 Base) MCG/ACT inhaler Inhale 1-2 puffs into the lungs every 4 (four) hours as needed for wheezing or shortness of breath. 12/19/17   McGowen, Adrian Blackwater, MD  atorvastatin (LIPITOR) 40 MG tablet TAKE 1 TABLET BY MOUTH EVERY DAY 03/08/19   McGowen, Adrian Blackwater, MD  metFORMIN (GLUCOPHAGE) 500 MG tablet TAKE 1 TABLET (500 MG TOTAL) BY MOUTH 2 (TWO) TIMES DAILY WITH A  MEAL. 03/08/19   McGowen, Adrian Blackwater, MD  methylphenidate (METADATE CD) 20 MG CR capsule Take 1 capsule (20 mg total) by mouth daily as needed. 03/10/19   McGowen, Adrian Blackwater, MD    Allergies    Lobster [shellfish allergy] and Poison ivy extract  Review of Systems   Review of Systems  All other systems reviewed and are negative.   Physical Exam Updated Vital Signs BP (!) 161/100   Pulse (!) 110   Temp 99.8 F (37.7 C) (Oral)   Resp 18   Ht 5' 10.5" (1.791 m)   Wt 117.5 kg   SpO2 96%   BMI 36.64 kg/m   Physical Exam Vitals and nursing note reviewed.  Constitutional:      General: He is not in acute distress.    Appearance: Normal appearance. He is not ill-appearing.  HENT:     Head: Normocephalic and atraumatic.  Pulmonary:     Effort: Pulmonary effort is normal.  Genitourinary:    Comments: In the soft tissues between the testicle and anus, there is an area of indurated skin with underlying fluctuance.  I was able to express a moderate quantity of dark blood/pus mixed fluid.  The fluctuance has significantly improved. Skin:    General: Skin is warm and dry.  Neurological:     Mental Status: He is alert.     ED Results / Procedures / Treatments   Labs (all labs ordered are listed, but only abnormal results are displayed) Labs Reviewed - No data to display  EKG None  Radiology No results found.  Procedures Procedures (including critical care time)  Medications Ordered in ED Medications - No data to display  ED Course  I have reviewed the triage vital signs and the nursing notes.  Pertinent labs & imaging results that were available during my care of the patient were reviewed by me and considered in my medical decision making (see chart for details).    MDM Rules/Calculators/A&P  Patient with an abscess of the perineum that has spontaneously ruptured.  I was also able to squeeze additional quantity of bloody/purulent material.  This does not appear to  involve the the rectum to my exam.  Patient will be treated with antibiotics, sitz baths, and return as needed for any problems.  Final Clinical Impression(s) / ED Diagnoses Final diagnoses:  None    Rx / DC Orders ED Discharge Orders    None       Veryl Speak, MD 04/19/19 1744

## 2019-04-19 NOTE — ED Notes (Signed)
PT changed into down and I&D tray to bedside

## 2019-04-19 NOTE — Discharge Instructions (Addendum)
Begin taking Keflex and Bactrim as prescribed.  Perform sitz baths as frequently as possible for the next several days.  Return to the emergency department if you develop worsening pain, high fever, or other new and concerning symptoms.

## 2019-04-24 ENCOUNTER — Other Ambulatory Visit: Payer: Self-pay | Admitting: Family Medicine

## 2019-04-26 NOTE — Telephone Encounter (Signed)
Requesting: metadate Contract:03/10/19 UDS:09/04/18 Last Visit:03/10/19 Next Visit:advised to f/u 2 wks BP Last Refill:03/10/19(30,0) fill on/after 04/02/19  Please Advise. Medication pending

## 2019-04-26 NOTE — Telephone Encounter (Signed)
Pt should have a rx on file at pharmacy. I'm going to deny the RF.

## 2019-06-08 ENCOUNTER — Other Ambulatory Visit: Payer: Self-pay | Admitting: Family Medicine

## 2019-06-08 NOTE — Telephone Encounter (Signed)
RF request for Metadate  LOV:03/10/2019 Next ov: Not scheduled  Last written: 03/10/2019 #30 x0 refills    Please advise

## 2019-06-09 MED ORDER — METHYLPHENIDATE HCL ER (CD) 20 MG PO CPCR: 20.0000 mg | ORAL_CAPSULE | Freq: Every day | ORAL | 0 refills | Status: DC | PRN

## 2019-06-14 ENCOUNTER — Other Ambulatory Visit: Payer: Self-pay

## 2019-06-14 MED ORDER — METFORMIN HCL 500 MG PO TABS: 500.0000 mg | ORAL_TABLET | Freq: Two times a day (BID) | ORAL | 0 refills | Status: DC

## 2019-06-15 ENCOUNTER — Other Ambulatory Visit: Payer: Self-pay

## 2019-06-15 MED ORDER — ATORVASTATIN CALCIUM 40 MG PO TABS: 40.0000 mg | ORAL_TABLET | Freq: Every day | ORAL | 0 refills | Status: DC

## 2019-06-17 ENCOUNTER — Telehealth: Payer: Self-pay

## 2019-06-17 NOTE — Telephone Encounter (Signed)
PA sent via covermymed on 06/10/19   Key: [id number]   Medication: Methylphenidate 20mg  capsule   Dx: Adult ADHD, F90.9   Per Dr. Anitra Lauth pt has tried and failed N/A   Waiting for response.    PA approved 06/14/19-06/12/20. Reference # [id number]. Pharmacy notified.

## 2019-06-19 ENCOUNTER — Ambulatory Visit: Payer: 59 | Attending: Internal Medicine

## 2019-06-19 DIAGNOSIS — Z23 Encounter for immunization: Secondary | ICD-10-CM

## 2019-06-19 NOTE — Progress Notes (Signed)
   Covid-19 Vaccination Clinic  Name:  Richard [name]    MRN: [id number] DOB: 01/27/1956  06/19/2019  Richard [name] was observed post Covid-19 immunization for 15 minutes without incident. He was provided with Vaccine Information Sheet and instruction to access the V-Safe system.   Richard [name] was instructed to call 911 with any severe reactions post vaccine: [name] Kitchen Difficulty breathing  . Swelling of face and throat  . A fast heartbeat  . A bad rash all over body  . Dizziness and weakness   Immunizations Administered    Name Date Dose VIS Date Route   Pfizer COVID-19 Vaccine 06/19/2019 11:15 AM 0.3 mL 02/26/2019 Intramuscular   Manufacturer: [employer]   Lot: [id number]   [place]: [id number]

## 2019-07-14 ENCOUNTER — Ambulatory Visit: Payer: 59 | Attending: Internal Medicine

## 2019-07-14 DIAGNOSIS — Z23 Encounter for immunization: Secondary | ICD-10-CM

## 2019-07-14 NOTE — Progress Notes (Signed)
   Covid-19 Vaccination Clinic  Name:  Richard [name]    MRN: [id number] DOB: 10/13/1955  07/14/2019  [name] was observed post Covid-19 immunization for 15 minutes without incident. He was provided with Vaccine Information Sheet and instruction to access the V-Safe system.   [name] was instructed to call 911 with any severe reactions post vaccine: [name] Kitchen Difficulty breathing  . Swelling of face and throat  . A fast heartbeat  . A bad rash all over body  . Dizziness and weakness   Immunizations Administered    Name Date Dose VIS Date Route   Pfizer COVID-19 Vaccine 07/14/2019 11:50 AM 0.3 mL 05/12/2018 Intramuscular   Manufacturer: [place]   Lot: [id number]   [place]: [id number]

## 2019-07-26 ENCOUNTER — Other Ambulatory Visit: Payer: Self-pay | Admitting: Family Medicine

## 2019-07-26 MED ORDER — METHYLPHENIDATE HCL ER (CD) 20 MG PO CPCR: 20.0000 mg | ORAL_CAPSULE | Freq: Every day | ORAL | 0 refills | Status: DC | PRN

## 2019-07-26 NOTE — Telephone Encounter (Signed)
Requesting: methylphenidate Contract:03/10/19 UDS:09/04/18 Last Visit:03/10/19 Next Visit: advised to f/u 2 weeks BP Last Refill:06/09/19(30,0)  Please Advise. Medication pending

## 2019-07-27 NOTE — Telephone Encounter (Signed)
MyChart message read.

## 2019-08-01 ENCOUNTER — Other Ambulatory Visit: Payer: Self-pay

## 2019-08-01 ENCOUNTER — Encounter (HOSPITAL_BASED_OUTPATIENT_CLINIC_OR_DEPARTMENT_OTHER): Payer: Self-pay | Admitting: Emergency Medicine

## 2019-08-01 ENCOUNTER — Emergency Department (HOSPITAL_BASED_OUTPATIENT_CLINIC_OR_DEPARTMENT_OTHER)
Admission: EM | Admit: 2019-08-01 | Discharge: 2019-08-01 | Disposition: A | Discharge status: Home / Self Care | Payer: 59 | Attending: Emergency Medicine | Admitting: Emergency Medicine

## 2019-08-01 DIAGNOSIS — Z79899 Other long term (current) drug therapy: Secondary | ICD-10-CM | POA: Insufficient documentation

## 2019-08-01 DIAGNOSIS — Z87891 Personal history of nicotine dependence: Secondary | ICD-10-CM | POA: Insufficient documentation

## 2019-08-01 DIAGNOSIS — L0291 Cutaneous abscess, unspecified: Secondary | ICD-10-CM | POA: Diagnosis present

## 2019-08-01 DIAGNOSIS — E119 Type 2 diabetes mellitus without complications: Secondary | ICD-10-CM | POA: Diagnosis not present

## 2019-08-01 DIAGNOSIS — L02215 Cutaneous abscess of perineum: Secondary | ICD-10-CM | POA: Diagnosis not present

## 2019-08-01 MED ORDER — CLINDAMYCIN PHOSPHATE 600 MG/50ML IV SOLN
600.0000 mg | Freq: Once | INTRAVENOUS | Status: AC
  Administered 2019-08-01: 600 mg via INTRAVENOUS
  Filled 2019-08-01: qty 50

## 2019-08-01 MED ORDER — CLINDAMYCIN HCL 300 MG PO CAPS
300.0000 mg | ORAL_CAPSULE | Freq: Four times a day (QID) | ORAL | 0 refills | Status: DC
Start: 2019-08-01 — End: 2019-09-01

## 2019-08-01 NOTE — Discharge Instructions (Signed)
Begin taking clindamycin as prescribed.  Perform sitz baths or apply warm compresses as frequently as possible for the next several days.  Follow-up with general surgery if the abscess recurs or does not improve.

## 2019-08-01 NOTE — ED Provider Notes (Signed)
[facility] EMERGENCY DEPARTMENT Provider Note   CSN: [id number] Arrival date & time: 08/01/19  [id number]     History Chief Complaint  Patient presents with  . Abscess    Richard [name] is a 64 y.o. male.  Patient is a 64 year old male with past medical history of diabetes, hyperlipidemia, hypertension.  He presents today for evaluation of pain and swelling to the area between his testicles and anus.  He was seen with similar complaints 3-1/2 months ago and was diagnosed with an abscess.  He was given antibiotics after the abscess spontaneously ruptured and his symptoms improved.  They have returned over the past 2 days.  He denies any fevers or chills.  The history is provided by the patient.  Abscess Abscess location: Perineum. Abscess quality: draining, fluctuance, induration and painful   Duration:  2 days Progression:  Worsening Pain details:    Quality:  Throbbing   Severity:  Moderate   Timing:  Constant   Progression:  Worsening      Past Medical History:  Diagnosis Date  . Adult ADHD   . Allergic rhinitis   . Anxiety    claustrophobic  . Colon polyps 2012 and 2013   All hyperplastic except one adenomatous w/out high grade dysplasia (recall 2023 per Dr. Deatra Ina)  . Elevated blood pressure reading without diagnosis of hypertension   . Elevated transaminase level 2017   Viral hep screening neg.  Suspect fatty liver.  . Hyperlipidemia    No improvement with TLC 2017  . Obesity   . OSA on CPAP    Severe.  Wears CPAP but pt does not know his setting.  (Dr. Melvyn Novas follows him)  . Seborrheic dermatitis of scalp    and face: hydrocort 2% cream bid prn to face, and fluocinonide 0.5% sol'n to scalp bid prn per Dr. Allyson Sabal (04/2014)  . Type 2 diabetes mellitus with microalbuminuria (HCC) DM dx-09/27/2015. Microalb 2020    Patient Active Problem List   Diagnosis Date Noted  . IFG (impaired fasting glucose) 09/27/2015  . Borderline hypertension 09/27/2015  .  Lateral epicondylitis of left elbow 01/27/2015  . Fracture of toe, closed 05/18/2014  . Seborrheic dermatitis 02/28/2014  . Subacromial bursitis 11/09/2013  . Bronchopneumonia 11/03/2013  . Cerumen impaction 11/03/2013  . Posterior interosseous nerve syndrome 10/25/2013  . Hearing impairment 10/04/2013  . Right shoulder pain 10/04/2013  . Health maintenance examination 02/26/2013  . Hyperlipidemia 06/15/2011  . SEVERE OBSTRUCTIVE SLEEP APNEA with AHI 94 03/30/2010  . Morbid obesity ([place]) 02/06/2010  . Adult ADHD 03/07/2009  . ALLERGIC RHINITIS 03/06/2009  . DERMATITIS 03/06/2009    Past Surgical History:  Procedure Laterality Date  . COLONOSCOPY W/ POLYPECTOMY  04/2010; 10/2011   Initial screening TCS showed 6 polyps (one adenomatous).  Pt says he returned for repeat TCS 10/2011 showed one hyperplastic polyp: Recall 10 yrs per Dr. Deatra Ina  . MOUTH SURGERY         Family History  Problem Relation Age of Onset  . Lung cancer Father   . Cancer Father        nasal  . Allergies Father   . Colon cancer Neg Hx   . Esophageal cancer Neg Hx   . Rectal cancer Neg Hx   . Stomach cancer Neg Hx     Social History   Tobacco Use  . Smoking status: Former Smoker    Packs/day: 1.00    Types: Cigarettes    Quit date: 07/17/2002  Years since quitting: 17.0  . Smokeless tobacco: Never Used  Substance Use Topics  . Alcohol use: Yes    Alcohol/week: 7.0 standard drinks    Types: 7 Standard drinks or equivalent per week    Comment: wine every night with dinner  . Drug use: No    Home Medications Prior to Admission medications   Medication Sig Start Date End Date Taking? Authorizing Provider  albuterol (VENTOLIN HFA) 108 (90 Base) MCG/ACT inhaler Inhale 1-2 puffs into the lungs every 4 (four) hours as needed for wheezing or shortness of breath. 12/19/17   McGowen, Adrian Blackwater, MD  atorvastatin (LIPITOR) 40 MG tablet Take 1 tablet (40 mg total) by mouth daily. 06/15/19   McGowen, Adrian Blackwater,  MD  cephALEXin (KEFLEX) 500 MG capsule Take 1 capsule (500 mg total) by mouth 4 (four) times daily. 04/19/19   Veryl Speak, MD  metFORMIN (GLUCOPHAGE) 500 MG tablet Take 1 tablet (500 mg total) by mouth 2 (two) times daily with a meal. 06/14/19   McGowen, Adrian Blackwater, MD  methylphenidate (METADATE CD) 20 MG CR capsule Take 1 capsule (20 mg total) by mouth daily as needed. 07/26/19   McGowen, Adrian Blackwater, MD    Allergies    Lobster [shellfish allergy] and Poison ivy extract  Review of Systems   Review of Systems  All other systems reviewed and are negative.   Physical Exam Updated Vital Signs BP (!) 147/94 (BP Location: Left Arm)   Pulse (!) 112   Temp 99.6 F (37.6 C) (Oral)   Resp 20   Ht 5\' 11"  (1.803 m)   Wt 118 kg   SpO2 99%   BMI 36.28 kg/m   Physical Exam Vitals and nursing note reviewed.  Constitutional:      General: He is not in acute distress.    Appearance: Normal appearance. He is not ill-appearing.  HENT:     Head: Normocephalic and atraumatic.  Pulmonary:     Effort: Pulmonary effort is normal.  Genitourinary:    Comments: There is a swollen, fluctuant, tender area to the perineum just behind the testicles.  There is dark, malodorous, purulent material draining from this area. Skin:    General: Skin is warm and dry.  Neurological:     Mental Status: He is alert.     ED Results / Procedures / Treatments   Labs (all labs ordered are listed, but only abnormal results are displayed) Labs Reviewed - No data to display  EKG None  Radiology No results found.  Procedures Procedures (including critical care time)  Medications Ordered in ED Medications  clindamycin (CLEOCIN) IVPB 600 mg (has no administration in time range)    ED Course  I have reviewed the triage vital signs and the nursing notes.  Pertinent labs & imaging results that were available during my care of the patient were reviewed by me and considered in my medical decision making (see  chart for details).    MDM Rules/Calculators/A&P  Patient presenting here with complaints of a perineal abscess.  This has spontaneously ruptured while he was in the ER waiting to be seen by provider.  A moderate amount of pus was expressed and it appears to be draining adequately.  I do not feel as though further incision is indicated at this time.  He will be given IV clindamycin and discharged with clindamycin.  He is to perform sitz baths or warm compresses as frequently as possible for the next several days.  This  is the second time he has had this happen in the past 4 months.  Referral to general surgery to discuss options if patient so desires.  Final Clinical Impression(s) / ED Diagnoses Final diagnoses:  None    Rx / DC Orders ED Discharge Orders    None       Veryl Speak, MD 08/01/19 1202

## 2019-08-01 NOTE — ED Triage Notes (Signed)
Pt reports recurring abscess to testicles that returned last night.

## 2019-08-28 ENCOUNTER — Other Ambulatory Visit: Payer: Self-pay | Admitting: Family Medicine

## 2019-08-30 MED ORDER — METHYLPHENIDATE HCL ER (CD) 20 MG PO CPCR: 20.0000 mg | ORAL_CAPSULE | Freq: Every day | ORAL | 0 refills | Status: DC | PRN

## 2019-08-30 NOTE — Telephone Encounter (Signed)
RF request for Methylphenidate. Last OV 03/10/2019 Next OV 09/01/19 Last RX 07/26/19 # 30  Please advise.

## 2019-09-01 ENCOUNTER — Encounter: Payer: Self-pay | Admitting: Family Medicine

## 2019-09-01 ENCOUNTER — Encounter: Payer: Self-pay | Admitting: Neurology

## 2019-09-01 ENCOUNTER — Other Ambulatory Visit: Payer: Self-pay

## 2019-09-01 ENCOUNTER — Ambulatory Visit (INDEPENDENT_AMBULATORY_CARE_PROVIDER_SITE_OTHER): Payer: 59 | Admitting: Family Medicine

## 2019-09-01 VITALS — BP 155/85 | HR 104 | Temp 98.0°F | Resp 16 | Ht 69.5 in | Wt 268.2 lb

## 2019-09-01 DIAGNOSIS — I10 Essential (primary) hypertension: Secondary | ICD-10-CM

## 2019-09-01 DIAGNOSIS — R202 Paresthesia of skin: Secondary | ICD-10-CM

## 2019-09-01 DIAGNOSIS — E7800 Pure hypercholesterolemia, unspecified: Secondary | ICD-10-CM

## 2019-09-01 DIAGNOSIS — E119 Type 2 diabetes mellitus without complications: Secondary | ICD-10-CM

## 2019-09-01 DIAGNOSIS — L02215 Cutaneous abscess of perineum: Secondary | ICD-10-CM

## 2019-09-01 DIAGNOSIS — R200 Anesthesia of skin: Secondary | ICD-10-CM

## 2019-09-01 DIAGNOSIS — I872 Venous insufficiency (chronic) (peripheral): Secondary | ICD-10-CM

## 2019-09-01 MED ORDER — LISINOPRIL 10 MG PO TABS
10.0000 mg | ORAL_TABLET | Freq: Every day | ORAL | 0 refills | Status: DC
Start: 2019-09-01 — End: 2019-10-04

## 2019-09-01 MED ORDER — MUPIROCIN 2 % EX OINT
TOPICAL_OINTMENT | CUTANEOUS | 3 refills | Status: DC
Start: 2019-09-01 — End: 2021-08-06

## 2019-09-01 NOTE — Progress Notes (Signed)
OFFICE VISIT  09/17/2019   CC:  Chief Complaint  Patient presents with  . Discuss feet and abscess    he has been having numbness and loss of feeling for 1 year now off and on. The abscess first occurred 4 months ago and has had it drained since then but reocurring 1 month ago   HPI:    Patient is a 64 y.o. Caucasian male who presents for "I want to have a discussion about my feet and an abscess".  Hx of recurrent perineum abscesses that have drained spontaneously, went to ED x 2 this year, abx given each time and it completely resolved.  He is wondering why they occur and what can be done to prevent it, if anything. He had no f/c/malaise or other signs of systemic illness during these. Prior to this year he never had this problem before.  Bottoms of both feet feeling numb on and off, hard to feel the accelerator when driving, some burning and tingling in feet and toes as well.  BP's at home 381 systolic avg, he doesn't know any diastolics or HR's. Not on bp med at this time. Not dieting, no exercise.  ROS: no fevers, no CP, no SOB, no wheezing, no cough, no dizziness, no HAs, no rashes, no melena/hematochezia.  No polyuria or polydipsia.  No myalgias or arthralgias.  No focal weakness or tremors.  No acute vision or hearing abnormalities. No n/v/d or abd pain.  No palpitations.     Past Medical History:  Diagnosis Date  . Adult ADHD   . Allergic rhinitis   . Anxiety    claustrophobic  . Colon polyps 2012 and 2013   All hyperplastic except one adenomatous w/out high grade dysplasia (recall 2023 per Dr. Deatra Ina)  . Elevated blood pressure reading without diagnosis of hypertension   . Elevated transaminase level 2017   Viral hep screening neg.  Suspect fatty liver.  . Hyperlipidemia    No improvement with TLC 2017  . Obesity   . OSA on CPAP    Severe.  Wears CPAP but pt does not know his setting.  (Dr. Melvyn Novas follows him)  . Seborrheic dermatitis of scalp    and face: hydrocort  2% cream bid prn to face, and fluocinonide 0.5% sol'n to scalp bid prn per Dr. Allyson Sabal (04/2014)  . Type 2 diabetes mellitus with microalbuminuria (HCC) DM dx-09/27/2015. Microalb 2020    Past Surgical History:  Procedure Laterality Date  . COLONOSCOPY W/ POLYPECTOMY  04/2010; 10/2011   Initial screening TCS showed 6 polyps (one adenomatous).  Pt says he returned for repeat TCS 10/2011 showed one hyperplastic polyp: Recall 10 yrs per Dr. Deatra Ina  . MOUTH SURGERY      Outpatient Medications Prior to Visit  Medication Sig Dispense Refill  . methylphenidate (METADATE CD) 20 MG CR capsule Take 1 capsule (20 mg total) by mouth daily as needed. 30 capsule 0  . atorvastatin (LIPITOR) 40 MG tablet Take 1 tablet (40 mg total) by mouth daily. 90 tablet 0  . metFORMIN (GLUCOPHAGE) 500 MG tablet Take 1 tablet (500 mg total) by mouth 2 (two) times daily with a meal. 180 tablet 0  . albuterol (VENTOLIN HFA) 108 (90 Base) MCG/ACT inhaler Inhale 1-2 puffs into the lungs every 4 (four) hours as needed for wheezing or shortness of breath. (Patient not taking: Reported on 09/01/2019) 1 Inhaler 0  . cephALEXin (KEFLEX) 500 MG capsule Take 1 capsule (500 mg total) by mouth 4 (four)  times daily. (Patient not taking: Reported on 09/01/2019) 28 capsule 0  . clindamycin (CLEOCIN) 300 MG capsule Take 1 capsule (300 mg total) by mouth 4 (four) times daily. X 7 days (Patient not taking: Reported on 09/01/2019) 28 capsule 0   No facility-administered medications prior to visit.    Allergies  Allergen Reactions  . Lobster [Shellfish Allergy] Nausea Only  . Poison Ivy Extract Itching    ROS As per HPI  PE: Vitals with BMI 09/01/2019 08/01/2019 04/19/2019  Height 5' 9.5" 5\' 11"  5' 10.5"  Weight 268 lbs 3 oz 260 lbs 2 oz 259 lbs  BMI 39.05 78.2 95.62  Systolic 130 865 784  Diastolic 85 94 696  Pulse 104 112 110    Gen: Alert, well appearing.  Patient is oriented to person, place, time, and situation. AFFECT: pleasant,  lucid thought and speech. Foot exam -  no swelling, tenderness or skin lesions.  Some spider veins are present as are a couple of small, non-inflamed varicosities in both LL's.  Some 1+ pitting edema in both LL's with slight freckling pigmentation changes. No erythema or tenderness.  R a bit worse than L. Color and temperature is normal. Impaired sensation to monofilament testing bilat.. Peripheral pulses are palpable. Toenails are normal. GU: no focal lesions, nodules, or lymphadenopathy. Penis and testes/scrotum normal.  Anus normal, no fistula. Small hypopigmented patch of skin in perineum with question of small (bee-bee sized) subQ mobile nodule palpable at periphery of the hypopigmented area. No tenderness, no abscess, no cyst.  No erythema, no pustule or vesicle.   LABS:  Lab Results  Component Value Date   TSH 2.58 03/10/2019   Lab Results  Component Value Date   WBC 8.1 03/10/2019   HGB 14.6 03/10/2019   HCT 41.7 03/10/2019   MCV 103.7 (H) 03/10/2019   PLT 201 03/10/2019   Lab Results  Component Value Date   CREATININE 0.67 09/17/2019   BUN 15 09/17/2019   NA 135 09/17/2019   K 4.7 09/17/2019   CL 99 09/17/2019   CO2 25 09/17/2019   Lab Results  Component Value Date   ALT 38 03/10/2019   AST 55 (H) 03/10/2019   ALKPHOS 86 03/03/2018   BILITOT 0.6 03/10/2019   Lab Results  Component Value Date   CHOL 121 03/10/2019   Lab Results  Component Value Date   HDL 52 03/10/2019   Lab Results  Component Value Date   LDLCALC 52 03/10/2019   Lab Results  Component Value Date   TRIG 86 03/10/2019   Lab Results  Component Value Date   CHOLHDL 2.3 03/10/2019   Lab Results  Component Value Date   PSA 0.2 03/10/2019   PSA 0.30 03/03/2018   PSA 0.40 02/26/2017   Lab Results  Component Value Date   HGBA1C 6.8 (H) 03/10/2019   IMPRESSION AND PLAN:  1) Recurrent perineum abscess, currently w/out any: no known culture done in the past. ? MRSA. Will do  bactroban ointment decolonization attempt: apply bid to nostrils, axillae, groin, anal crease, perineum for 7d for two consecutive months.  2) HTN: not controlled.  Start lisinopril 10mg  qd.  BMET 1 wk. ONgoing home bp monitoring.  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 Discussed options: pt in favor of referral to neurologist for further evaluation of this.  4) Lower ext venous insuff edema: discussed low Na intake, elevation of legs prn, otc compression hose.  An After Visit Summary was printed and given  to the patient.  FOLLOW UP: Return in about 4 weeks (around 09/29/2019) for f/u HTN. and DM.  Signed:  Crissie Sickles, MD           09/17/2019

## 2019-09-08 ENCOUNTER — Other Ambulatory Visit: Payer: Self-pay | Admitting: Family Medicine

## 2019-09-17 ENCOUNTER — Other Ambulatory Visit: Payer: Self-pay

## 2019-09-17 ENCOUNTER — Ambulatory Visit (INDEPENDENT_AMBULATORY_CARE_PROVIDER_SITE_OTHER): Payer: 59 | Admitting: Family Medicine

## 2019-09-17 DIAGNOSIS — I10 Essential (primary) hypertension: Secondary | ICD-10-CM

## 2019-09-17 LAB — BASIC METABOLIC PANEL
BUN: 15 mg/dL (ref 6–23)
CO2: 25 mEq/L (ref 19–32)
Calcium: 10.6 mg/dL — ABNORMAL HIGH (ref 8.4–10.5)
Chloride: 99 mEq/L (ref 96–112)
Creatinine, Ser: 0.67 mg/dL (ref 0.40–1.50)
GFR: 119.63 mL/min (ref >60.00)
Glucose, Bld: 159 mg/dL — ABNORMAL HIGH (ref 70–99)
Potassium: 4.7 mEq/L (ref 3.5–5.1)
Sodium: 135 mEq/L (ref 135–145)

## 2019-09-23 ENCOUNTER — Other Ambulatory Visit: Payer: Self-pay | Admitting: Family Medicine

## 2019-09-23 NOTE — Telephone Encounter (Signed)
Patient should have enough rx to make it until his appt 10/01/19.

## 2019-10-01 ENCOUNTER — Encounter: Payer: Self-pay | Admitting: Family Medicine

## 2019-10-01 ENCOUNTER — Ambulatory Visit (INDEPENDENT_AMBULATORY_CARE_PROVIDER_SITE_OTHER): Payer: 59 | Admitting: Family Medicine

## 2019-10-01 ENCOUNTER — Other Ambulatory Visit: Payer: Self-pay

## 2019-10-01 VITALS — BP 126/83 | HR 115 | Temp 97.6°F | Resp 16 | Ht 69.5 in | Wt 263.2 lb

## 2019-10-01 DIAGNOSIS — I10 Essential (primary) hypertension: Secondary | ICD-10-CM

## 2019-10-01 DIAGNOSIS — E7800 Pure hypercholesterolemia, unspecified: Secondary | ICD-10-CM

## 2019-10-01 DIAGNOSIS — F988 Other specified behavioral and emotional disorders with onset usually occurring in childhood and adolescence: Secondary | ICD-10-CM | POA: Diagnosis not present

## 2019-10-01 DIAGNOSIS — E1149 Type 2 diabetes mellitus with other diabetic neurological complication: Secondary | ICD-10-CM | POA: Diagnosis not present

## 2019-10-01 LAB — POCT GLYCOSYLATED HEMOGLOBIN (HGB A1C)
HbA1c POC (<> result, manual entry): 8.3 % (ref 4.0–5.6)
HbA1c, POC (controlled diabetic range): 8.3 % — AB (ref 0.0–7.0)
HbA1c, POC (prediabetic range): 8.3 % — AB (ref 5.7–6.4)
Hemoglobin A1C: 8.3 % — AB (ref 4.0–5.6)

## 2019-10-01 MED ORDER — METHYLPHENIDATE HCL ER (CD) 20 MG PO CPCR: 20.0000 mg | ORAL_CAPSULE | Freq: Every day | ORAL | 0 refills | Status: DC | PRN

## 2019-10-01 NOTE — Progress Notes (Signed)
OFFICE VISIT  10/01/2019   CC:  Chief Complaint  Patient presents with  . Follow-up    HTN, 4 week    HPI:    Patient is a 64 y.o. Caucasian male who presents for 1 mo f/u DM 2, HTN, and adult ADD. A/P as of last visit: "1) Recurrent perineum abscess, currently w/out any: no known culture done in the past. ? MRSA. Will do bactroban ointment decolonization attempt: apply bid to nostrils, axillae, groin, anal crease, perineum for 7d for two consecutive months.  2) HTN: not controlled.  Start lisinopril 10mg  qd.  BMET 1 wk. ONgoing home bp monitoring.  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 Discussed options: pt in favor of referral to neurologist for further evaluation of this.  4) Lower ext venous insuff edema: discussed low Na intake, elevation of legs prn, otc compression hose."  INTERIM HX: F/u BMET was normal except mildly elevated glucose. No home bp checks--misplaced his cuff. Drinks 3/4 gallon of tea per day.  No home glucose monitoring being done. Taking metformin 500 mg bid. Shooting for low carb diet. He walks daily w/out CP, tightness, wheezing, or SOB. Taking atorva well w/out complaints.  Pt states all is going well with the med at current dosing: much improved focus, concentration, task completion.  Less frustration, better multitasking, less impulsivity and restlessness.  Mood is stable. No side effects from the medication.   PMP AWARE reviewed today: most recent rx for methylphenidate was filled 08/30/19, # 30, rx by me. No red flags.  ROS: no fevers, no cough, no dizziness, no HAs, no rashes, no melena/hematochezia.  No polyuria or polydipsia.  No myalgias or arthralgias.  No focal weakness, paresthesias, or tremors.  No acute vision or hearing abnormalities. No n/v/d or abd pain.  No palpitations.     Past Medical History:  Diagnosis Date  . Adult ADHD   . Allergic rhinitis   . Anxiety    claustrophobic  . Colon polyps 2012 and  2013   All hyperplastic except one adenomatous w/out high grade dysplasia (recall 2023 per Dr. Deatra Ina)  . Elevated blood pressure reading without diagnosis of hypertension   . Elevated transaminase level 2017   Viral hep screening neg.  Suspect fatty liver.  . Hyperlipidemia    No improvement with TLC 2017  . Obesity   . OSA on CPAP    Severe.  Wears CPAP but pt does not know his setting.  (Dr. Melvyn Novas follows him)  . Seborrheic dermatitis of scalp    and face: hydrocort 2% cream bid prn to face, and fluocinonide 0.5% sol'n to scalp bid prn per Dr. Allyson Sabal (04/2014)  . Type 2 diabetes mellitus with microalbuminuria (HCC) DM dx-09/27/2015. Microalb 2020    Past Surgical History:  Procedure Laterality Date  . COLONOSCOPY W/ POLYPECTOMY  04/2010; 10/2011   Initial screening TCS showed 6 polyps (one adenomatous).  Pt says he returned for repeat TCS 10/2011 showed one hyperplastic polyp: Recall 10 yrs per Dr. Deatra Ina  . MOUTH SURGERY      Outpatient Medications Prior to Visit  Medication Sig Dispense Refill  . atorvastatin (LIPITOR) 40 MG tablet TAKE 1 TABLET BY MOUTH EVERY DAY 90 tablet 0  . lisinopril (ZESTRIL) 10 MG tablet Take 1 tablet (10 mg total) by mouth daily. 30 tablet 0  . metFORMIN (GLUCOPHAGE) 500 MG tablet TAKE 1 TABLET (500 MG TOTAL) BY MOUTH 2 (TWO) TIMES DAILY WITH A MEAL. 180 tablet 0  .  methylphenidate (METADATE CD) 20 MG CR capsule Take 1 capsule (20 mg total) by mouth daily as needed. 30 capsule 0  . mupirocin ointment (BACTROBAN) 2 % 1 application twice a day to each nostril, each armpit, both groin creases, anal crease, and perineum for 7 days Repeat this process in 1 month 30 g 3  . albuterol (VENTOLIN HFA) 108 (90 Base) MCG/ACT inhaler Inhale 1-2 puffs into the lungs every 4 (four) hours as needed for wheezing or shortness of breath. (Patient not taking: Reported on 09/01/2019) 1 Inhaler 0   No facility-administered medications prior to visit.    Allergies  Allergen  Reactions  . Lobster [Shellfish Allergy] Nausea Only  . Poison Ivy Extract Itching    ROS As per HPI  PE: Vitals with BMI 10/01/2019 09/01/2019 08/01/2019  Height 5' 9.5" 5' 9.5" 5\' 11"   Weight 263 lbs 3 oz 268 lbs 3 oz 260 lbs 2 oz  BMI 38.32 44.03 47.4  Systolic 259 563 875  Diastolic 83 85 94  Pulse 643 104 112  O2 sat on RA today is 95%  Gen: Alert, well appearing.  Patient is oriented to person, place, time, and situation. AFFECT: pleasant, lucid thought and speech. CV: Regular, tachy to 110, no m/r/g.   LUNGS: CTA bilat, nonlabored resps, good aeration in all lung fields. EXT: no clubbing or cyanosis.  1+ pitting edema.    LABS:  Lab Results  Component Value Date   TSH 2.58 03/10/2019   Lab Results  Component Value Date   WBC 8.1 03/10/2019   HGB 14.6 03/10/2019   HCT 41.7 03/10/2019   MCV 103.7 (H) 03/10/2019   PLT 201 03/10/2019   Lab Results  Component Value Date   CREATININE 0.67 09/17/2019   BUN 15 09/17/2019   NA 135 09/17/2019   K 4.7 09/17/2019   CL 99 09/17/2019   CO2 25 09/17/2019   Lab Results  Component Value Date   ALT 38 03/10/2019   AST 55 (H) 03/10/2019   ALKPHOS 86 03/03/2018   BILITOT 0.6 03/10/2019   Lab Results  Component Value Date   CHOL 121 03/10/2019   Lab Results  Component Value Date   HDL 52 03/10/2019   Lab Results  Component Value Date   LDLCALC 52 03/10/2019   Lab Results  Component Value Date   TRIG 86 03/10/2019   Lab Results  Component Value Date   CHOLHDL 2.3 03/10/2019   Lab Results  Component Value Date   PSA 0.2 03/10/2019   PSA 0.30 03/03/2018   PSA 0.40 02/26/2017   Lab Results  Component Value Date   HGBA1C 6.8 (H) 03/10/2019   POC Hba1c today is 8.3%  IMPRESSION AND PLAN:  1) DM 2, 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  He has appt with a neurologist in a few months to further evaluate his peripheral neuropathy sx's. POC a1c today 8.3%, up 1.5% from 7 months ago. Will keep metformin at 500 mg bid b/c he  had diarrhea on higher dosing. I want to add a [id number]-Inh. Instructions: Call your insurer and see which of these three meds are on your formulary: Noni Saupe Call us back and tell which one and I'll send in rx.  2) HTN: well controlled now on lisinopril 10mg  qd. Encouraged him to monitor this a couple times per week at home.  3) HLD: tolerating statin.  LDL was 52 back in 02/2019. Plan repeat 6 mo.  4) Adult ADD: stable. I did  electronic rx's for methylphenidate  CR 20 mg, 1 qd, #30 today for each of the next 3 mo.  Appropriate fill on/after date was noted on each rx.  An After Visit Summary was printed and given to the patient.  FOLLOW UP: No follow-ups on file.  Signed:  Crissie Sickles, MD           10/01/2019

## 2019-10-01 NOTE — Patient Instructions (Signed)
Call your insurer and see which of these three meds are on your formulary: Noni Saupe  Call us back and tell which one and I'll send in rx.

## 2019-10-04 ENCOUNTER — Other Ambulatory Visit: Payer: Self-pay

## 2019-10-04 ENCOUNTER — Telehealth: Payer: Self-pay

## 2019-10-04 MED ORDER — LISINOPRIL 10 MG PO TABS: 10.0000 mg | ORAL_TABLET | Freq: Every day | ORAL | 0 refills | Status: DC

## 2019-10-04 NOTE — Telephone Encounter (Signed)
RF sent for 90 day supply

## 2019-10-04 NOTE — Telephone Encounter (Signed)
Patient was seen on Friday, he thought that his prescription would have be sent to his pharmacy since that why he was here.  He has not received any messages from [facility] saying that a med was ready, so he called, and says he cannot get filled until after appt. (again, his appt was on Friday 7/16 Dr.McGowen)   New prescription needed.   90 d/s please  lisinopril (ZESTRIL) 10 MG tablet [[id number]]    [facility]   Patient okay to wait until tomorrow 7/20 because of time of call today is after 4pm. Patient can be reached at [phone].

## 2019-10-05 NOTE — Telephone Encounter (Signed)
Tried to contact patient to inform refill sent but unable to leave message. VM is not set up

## 2019-12-07 ENCOUNTER — Other Ambulatory Visit: Payer: Self-pay | Admitting: Family Medicine

## 2019-12-13 ENCOUNTER — Encounter: Payer: Self-pay | Admitting: Neurology

## 2019-12-13 ENCOUNTER — Other Ambulatory Visit: Payer: Self-pay

## 2019-12-13 ENCOUNTER — Ambulatory Visit (INDEPENDENT_AMBULATORY_CARE_PROVIDER_SITE_OTHER): Payer: 59 | Admitting: Neurology

## 2019-12-13 ENCOUNTER — Other Ambulatory Visit: Payer: 59

## 2019-12-13 VITALS — BP 170/96 | HR 108 | Ht 69.0 in | Wt 264.0 lb

## 2019-12-13 DIAGNOSIS — E1142 Type 2 diabetes mellitus with diabetic polyneuropathy: Secondary | ICD-10-CM

## 2019-12-13 DIAGNOSIS — Z7289 Other problems related to lifestyle: Secondary | ICD-10-CM | POA: Diagnosis not present

## 2019-12-13 DIAGNOSIS — G629 Polyneuropathy, unspecified: Secondary | ICD-10-CM

## 2019-12-13 DIAGNOSIS — Z789 Other specified health status: Secondary | ICD-10-CM

## 2019-12-13 LAB — SEDIMENTATION RATE: Sed Rate: 31 mm/hr — ABNORMAL HIGH (ref 0–20)

## 2019-12-13 LAB — C-REACTIVE PROTEIN: CRP: 1.4 mg/dL (ref 0.5–20.0)

## 2019-12-13 NOTE — Progress Notes (Signed)
[facility] Clinic Note - Initial Visit   Date: 12/13/19  Richard [name] MRN: [id number] DOB: 1956-01-12   Dear Dr. Anitra Lauth:  Thank you for your kind referral of Richard [name] for consultation of neuropathy. Although his history is well known to you, please allow [place] to reiterate it for the purpose of our medical record. The patient was accompanied to the clinic by self.   History of Present Illness: Richard [name] is a 64 y.o. right-handed male with diabetes mellitus, hypertension, hyperlipidemia, ADHD,  presenting for evaluation of bilateral feet numbness.   Starting in summer 2020, he began having tight sensation in the feet and by the winter, he noticed reduced sensation in the feet especially when using car pedals.  He feels as if he is walking on legos.  He also complaints of imbalance, which is worse on uneven ground or going down a ramp. He has numbness involving both feet and intermittent tingling/burning.   He drinks 2 glasses of wine nightly x 30 years.  He has been diabetic since July 2017, last HbA1c is 8.3.  Out-side paper records, electronic medical record, and images have been reviewed where available and summarized as:  Lab Results  Component Value Date   HGBA1C 8.3 (A) 10/01/2019   HGBA1C 8.3 10/01/2019   HGBA1C 8.3 (A) 10/01/2019   HGBA1C 8.3 (A) 10/01/2019   No results found for: [id number] Lab Results  Component Value Date   TSH 2.58 03/10/2019     Past Medical History:  Diagnosis Date  . Adult ADHD   . Allergic rhinitis   . Anxiety    claustrophobic  . Colon polyps 2012 and 2013   All hyperplastic except one adenomatous w/out high grade dysplasia (recall 2023 per Dr. Deatra Ina)  . Elevated blood pressure reading without diagnosis of hypertension   . Elevated transaminase level 2017   Viral hep screening neg.  Suspect fatty liver.  . Hyperlipidemia    No improvement with TLC 2017  . Hypertension   . Obesity   . OSA on  CPAP    Severe.  Wears CPAP but pt does not know his setting.  (Dr. Melvyn Novas follows him)  . Seborrheic dermatitis of scalp    and face: hydrocort 2% cream bid prn to face, and fluocinonide 0.5% sol'n to scalp bid prn per Dr. Allyson Sabal (04/2014)  . Type 2 diabetes mellitus with microalbuminuria (HCC) DM dx-09/27/2015. Microalb 2020    Past Surgical History:  Procedure Laterality Date  . COLONOSCOPY W/ POLYPECTOMY  04/2010; 10/2011   Initial screening TCS showed 6 polyps (one adenomatous).  Pt says he returned for repeat TCS 10/2011 showed one hyperplastic polyp: Recall 10 yrs per Dr. Deatra Ina  . MOUTH SURGERY       Medications:  Outpatient Encounter Medications as of 12/13/2019  Medication Sig  . atorvastatin (LIPITOR) 40 MG tablet TAKE 1 TABLET BY MOUTH EVERY DAY  . lisinopril (ZESTRIL) 10 MG tablet Take 1 tablet (10 mg total) by mouth daily.  . metFORMIN (GLUCOPHAGE) 500 MG tablet TAKE 1 TABLET (500 MG TOTAL) BY MOUTH 2 (TWO) TIMES DAILY WITH A MEAL.  . methylphenidate (METADATE CD) 20 MG CR capsule Take 1 capsule (20 mg total) by mouth daily as needed.  . mupirocin ointment (BACTROBAN) 2 % 1 application twice a day to each nostril, each armpit, both groin creases, anal crease, and perineum for 7 days Repeat this process in 1 month  . albuterol (VENTOLIN HFA) 108 (90 Base)  MCG/ACT inhaler Inhale 1-2 puffs into the lungs every 4 (four) hours as needed for wheezing or shortness of breath. (Patient not taking: Reported on 09/01/2019)   No facility-administered encounter medications on file as of 12/13/2019.    Allergies:  Allergies  Allergen Reactions  . Lobster [Shellfish Allergy] Nausea Only  . Poison Ivy Extract Itching    Family History: Family History  Problem Relation Age of Onset  . Lung cancer Father   . Cancer Father        nasal  . Allergies Father   . Colon cancer Neg Hx   . Esophageal cancer Neg Hx   . Rectal cancer Neg Hx   . Stomach cancer Neg Hx     Social  History: Social History   Tobacco Use  . Smoking status: Former Smoker    Packs/day: 1.00    Types: Cigarettes    Quit date: 07/17/2002    Years since quitting: 17.4  . Smokeless tobacco: Never Used  Vaping Use  . Vaping Use: Never used  Substance Use Topics  . Alcohol use: Yes    Alcohol/week: 7.0 standard drinks    Types: 7 Standard drinks or equivalent per week    Comment: wine every night with dinner  . Drug use: No   Social History   Social History Narrative   Married, 1 child--grown.   Occupation: Financial planner"   Tobacco: 20 pack-yr hx; quit 2004.   Alcohol: nightly cocktail   Drugs: none   Exercise: "walk some when I can".   Diet: normal.   Right Handed. Used to be left handed   Three story home   Drinks a lot of tea and occasional coffee    Vital Signs:  BP (!) 170/96   Pulse (!) 108   Ht 5' 9"  (1.753 m)   Wt 264 lb (119.7 kg)   SpO2 95%   BMI 38.99 kg/m   Neurological Exam: MENTAL STATUS including orientation to time, place, person, recent and remote memory, attention span and concentration, language, and fund of knowledge is normal.  Speech is not dysarthric.  CRANIAL NERVES: II:  No visual field defects.   III-IV-VI: Pupils equal round and reactive.  Normal conjugate, extra-ocular eye movements in all directions of gaze.  No nystagmus.  No ptosis.   V:  Normal facial sensation.    VII:  Normal facial symmetry and movements.   VIII:  Normal hearing and vestibular function.   IX-X:  Normal palatal movement.   XI:  Normal shoulder shrug and head rotation.   XII:  Normal tongue strength and range of motion, no deviation or fasciculation.  MOTOR:  No atrophy, fasciculations or abnormal movements.  No pronator drift.   Upper Extremity:  Right  Left  Deltoid  5/5   5/5   Biceps  5/5   5/5   Triceps  5/5   5/5   Infraspinatus 5/5  5/5  Medial pectoralis 5/5  5/5  Wrist extensors  5/5   5/5   Wrist flexors  5/5   5/5   Finger extensors  5/5   5/5    Finger flexors  5/5   5/5   Dorsal interossei  5/5   5/5   Abductor pollicis  5/5   5/5   Tone (Ashworth scale)  0  0   Lower Extremity:  Right  Left  Hip flexors  5/5   5/5   Hip extensors  5/5   5/5   Adductor 5/5  5/5  Abductor 5/5  5/5  Knee flexors  5/5   5/5   Knee extensors  5/5   5/5   Dorsiflexors  5/5   5/5   Plantarflexors  5/5   5/5   Toe extensors  5/5   5/5   Toe flexors  5/5   5/5   Tone (Ashworth scale)  0  0   MSRs:  Right        Left                  brachioradialis 2+  2+  biceps 2+  2+  triceps 2+  2+  patellar 2+  2+  ankle jerk tr  tr  Hoffman no  no  plantar response down  down   SENSORY:  Absent vibration distal to feet bilaterally, gradient pattern of temperature and pin prick loss from the mid-lower leg down.  Rhomberg testing is positive.  COORDINATION/GAIT: Normal finger-to- nose-finger.  Intact rapid alternating movements bilaterally.  Gait is mildly wide-based, unassisted and stable. Unsteady with tandem gait. Stressed gait intact.  IMPRESSION: Peripheral neuropathy manifesting with distal sensory loss and sensory ataxia. His neurological examination shows a distal large fiber peripheral neuropathy. I had extensive discussion with the patient regarding the pathogenesis, etiology, management, and natural course of neuropathy. Neuropathy tends to be slowly progressive, especially if underlying etiology is not adequately managed.  Neuropathy risk factors include: diabetes mellitus, alcohol use.  Management is symptomatic.    PLAN/RECOMMENDATIONS:  1.  NCS/EMG bilateral legs 2.  Check ESR, CRP, vitamin B12, folate, vitamin B1, copper, SPEP with IFE 3.  Consider PT for balance pending EMG results 4.  Patient educated on daily foot inspection, fall prevention, and safety precautions around the home. 5.  Tight diabetes management 6.  Reduce alcohol consumption 7.  Driving precautions  Return to clinic in 4 months   Thank you for allowing me to  participate in patient's care.  If I can answer any additional questions, I would be pleased to do so.    Sincerely,    Shine Mikes K. Posey Pronto, DO

## 2019-12-13 NOTE — Patient Instructions (Signed)
1.  Nerve testing of the legs 2.  Check labs 3.  Cut back on alcohol  4.  Tight diabetes management 5.  Check soles of the feet daily to monitor for cuts/bruises 6.  Take extra caution on uneven ground 7.  Based on your results, we can decide about balance therapy  Return to clinic in 4 months

## 2019-12-14 LAB — B12 AND FOLATE PANEL
Folate: 7 ng/mL (ref >5.9)
Vitamin B-12: 359 pg/mL (ref 211–911)

## 2019-12-17 ENCOUNTER — Other Ambulatory Visit: Payer: Self-pay | Admitting: Family Medicine

## 2019-12-17 ENCOUNTER — Telehealth: Payer: Self-pay

## 2019-12-17 LAB — PROTEIN ELECTROPHORESIS, SERUM
Albumin ELP: 3.6 g/dL — ABNORMAL LOW (ref 3.8–4.8)
Alpha 1: 0.3 g/dL (ref 0.2–0.3)
Alpha 2: 0.7 g/dL (ref 0.5–0.9)
Beta 2: 0.7 g/dL — ABNORMAL HIGH (ref 0.2–0.5)
Beta Globulin: 0.5 g/dL (ref 0.4–0.6)
Gamma Globulin: 1 g/dL (ref 0.8–1.7)
Total Protein: 6.8 g/dL (ref 6.1–8.1)

## 2019-12-17 LAB — VITAMIN B1: Vitamin B1 (Thiamine): 7 nmol/L — ABNORMAL LOW (ref 8–30)

## 2019-12-17 LAB — COPPER, SERUM: Copper: 102 ug/dL (ref 70–175)

## 2019-12-17 NOTE — Telephone Encounter (Signed)
OK, rx's denied.

## 2019-12-17 NOTE — Telephone Encounter (Signed)
Called patient and informed him of results and patient  verbalized understanding.

## 2019-12-17 NOTE — Telephone Encounter (Signed)
I spoke with the pharmacist at [facility] regarding requested medications. The methylphenidate was filled today and is available for pick up. The other requested medications were filled for a 90 day supply and should last him until his appointment on the 15th. Please deny refill request at this time.

## 2019-12-17 NOTE — Telephone Encounter (Signed)
-----   Message from Alda Berthold, DO sent at 12/17/2019 11:55 AM EDT ----- Please inform patient that labs show thiamine deficiency which can be seen with alcohol use.  Thiamine deficiency also worsens neuropathy. Remaining labs are normal. Please start over the counter vitamin B1 100mg  daily. Thanks.

## 2019-12-31 ENCOUNTER — Ambulatory Visit (INDEPENDENT_AMBULATORY_CARE_PROVIDER_SITE_OTHER): Payer: 59 | Admitting: Family Medicine

## 2019-12-31 ENCOUNTER — Encounter: Payer: Self-pay | Admitting: Family Medicine

## 2019-12-31 ENCOUNTER — Other Ambulatory Visit: Payer: Self-pay

## 2019-12-31 VITALS — BP 144/80 | HR 110 | Temp 98.6°F | Resp 16 | Ht 69.0 in | Wt 265.2 lb

## 2019-12-31 DIAGNOSIS — J069 Acute upper respiratory infection, unspecified: Secondary | ICD-10-CM

## 2019-12-31 DIAGNOSIS — R809 Proteinuria, unspecified: Secondary | ICD-10-CM

## 2019-12-31 DIAGNOSIS — F988 Other specified behavioral and emotional disorders with onset usually occurring in childhood and adolescence: Secondary | ICD-10-CM | POA: Diagnosis not present

## 2019-12-31 DIAGNOSIS — E7800 Pure hypercholesterolemia, unspecified: Secondary | ICD-10-CM

## 2019-12-31 DIAGNOSIS — I10 Essential (primary) hypertension: Secondary | ICD-10-CM

## 2019-12-31 DIAGNOSIS — E1129 Type 2 diabetes mellitus with other diabetic kidney complication: Secondary | ICD-10-CM | POA: Diagnosis not present

## 2019-12-31 LAB — COMPREHENSIVE METABOLIC PANEL WITH GFR
ALT: 34 U/L (ref 0–53)
AST: 63 U/L — ABNORMAL HIGH (ref 0–37)
Albumin: 4.1 g/dL (ref 3.5–5.2)
Alkaline Phosphatase: 110 U/L (ref 39–117)
BUN: 13 mg/dL (ref 6–23)
CO2: 25 meq/L (ref 19–32)
Calcium: 9.7 mg/dL (ref 8.4–10.5)
Chloride: 96 meq/L (ref 96–112)
Creatinine, Ser: 0.62 mg/dL (ref 0.40–1.50)
GFR: 105.02 mL/min (ref >60.00)
Glucose, Bld: 186 mg/dL — ABNORMAL HIGH (ref 70–99)
Potassium: 4.2 meq/L (ref 3.5–5.1)
Sodium: 132 meq/L — ABNORMAL LOW (ref 135–145)
Total Bilirubin: 0.6 mg/dL (ref 0.2–1.2)
Total Protein: 7.2 g/dL (ref 6.0–8.3)

## 2019-12-31 LAB — LIPID PANEL
Cholesterol: 124 mg/dL (ref 0–200)
HDL: 41.5 mg/dL (ref >39.00)
LDL Cholesterol: 52 mg/dL (ref 0–99)
NonHDL: 82.71
Total CHOL/HDL Ratio: 3
Triglycerides: 156 mg/dL — ABNORMAL HIGH (ref 0.0–149.0)
VLDL: 31.2 mg/dL (ref 0.0–40.0)

## 2019-12-31 LAB — HEMOGLOBIN A1C: Hgb A1c MFr Bld: 9.9 % — ABNORMAL HIGH (ref 4.6–6.5)

## 2019-12-31 MED ORDER — EMPAGLIFLOZIN 10 MG PO TABS
10.0000 mg | ORAL_TABLET | Freq: Every day | ORAL | 2 refills | Status: DC
Start: 2019-12-31 — End: 2020-03-07

## 2019-12-31 MED ORDER — LISINOPRIL 10 MG PO TABS: 10.0000 mg | ORAL_TABLET | Freq: Every day | ORAL | 3 refills | Status: DC

## 2019-12-31 NOTE — Progress Notes (Signed)
OFFICE VISIT  12/31/2019  CC:  Chief Complaint  Patient presents with  . Follow-up    RCI, pt is fasting    HPI:    Patient is a 64 y.o. Caucasian male who presents for f/u adult ADD, HTN, DM 2. I last saw him 5 mo ago. A/P as of last visit: ") DM 2, 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  He has appt with a neurologist in a few months to further evaluate his peripheral neuropathy sx's. POC a1c today 8.3%, up 1.5% from 7 months ago. Will keep metformin at 500 mg bid b/c he had diarrhea on higher dosing. I want to add a SGLT2-Inh. Instructions: Call your insurer and see which of these three meds are on your formulary: Noni Saupe Call us back and tell which one and I'll send in rx.  2) HTN: well controlled now on lisinopril 10mg  qd. Encouraged him to monitor this a couple times per week at home.  3) HLD: tolerating statin.  LDL was 52 back in 02/2019. Plan repeat 6 mo.  4) Adult ADD: stable. I did electronic rx's for methylphenidate  CR 20 mg, 1 qd, #30 today for each of the next 3 mo.  Appropriate fill on/after date was noted on each rx."  INTERIM HX: Has had 4-5 d nasal congestion, PND, some rattly cough.  No wheezing or SOB or fevers.  Alka seltzer cold/cough, no decongestant (?) in it. Says "I know its not covid".  He somehow slipped through our office screening process regarding protocol around sx's with resp sx's needing to be seen virtually. I gowned appropriately.  He has been covid vaccinated, as has his family members he lives with. He does not leave his home--is quarantining other than coming in here today.  He is pleasant and says all else seems to be going fine.   No home gluc monitoring or bp monitoring. Compliant with all meds, tolerating statin. He is fasting today.  Pt states all is going well with the med at current dosing: much improved focus, concentration, task completion.  Less frustration, better multitasking, less impulsivity and restlessness.  Mood  is stable. No side effects from the medication.  PMP AWARE reviewed today: most recent rx for methylph CD was filled 12/17/19, # 65, rx by me. No red flags.  Past Medical History:  Diagnosis Date  . Adult ADHD   . Allergic rhinitis   . Anxiety    claustrophobic  . Colon polyps 2012 and 2013   All hyperplastic except one adenomatous w/out high grade dysplasia (recall 2023 per Dr. Deatra Ina)  . Elevated blood pressure reading without diagnosis of hypertension   . Elevated transaminase level 2017   Viral hep screening neg.  Suspect fatty liver.  . Hyperlipidemia    No improvement with TLC 2017  . Hypertension   . Obesity   . OSA on CPAP    Severe.  Wears CPAP but pt does not know his setting.  (Dr. Melvyn Novas follows him)  . Seborrheic dermatitis of scalp    and face: hydrocort 2% cream bid prn to face, and fluocinonide 0.5% sol'n to scalp bid prn per Dr. Allyson Sabal (04/2014)  . Type 2 diabetes mellitus with microalbuminuria (HCC) DM dx-09/27/2015. Microalb 2020    Past Surgical History:  Procedure Laterality Date  . COLONOSCOPY W/ POLYPECTOMY  04/2010; 10/2011   Initial screening TCS showed 6 polyps (one adenomatous).  Pt says he returned for repeat TCS 10/2011 showed one hyperplastic polyp: Recall 10 yrs  per Dr. Deatra Ina  . MOUTH SURGERY      Outpatient Medications Prior to Visit  Medication Sig Dispense Refill  . atorvastatin (LIPITOR) 40 MG tablet TAKE 1 TABLET BY MOUTH EVERY DAY 90 tablet 0  . azithromycin (ZITHROMAX) 500 MG tablet Take 500 mg by mouth daily.    . metFORMIN (GLUCOPHAGE) 500 MG tablet TAKE 1 TABLET (500 MG TOTAL) BY MOUTH 2 (TWO) TIMES DAILY WITH A MEAL. 180 tablet 0  . methylphenidate (METADATE CD) 20 MG CR capsule Take 1 capsule (20 mg total) by mouth daily as needed. 30 capsule 0  . mupirocin ointment (BACTROBAN) 2 % 1 application twice a day to each nostril, each armpit, both groin creases, anal crease, and perineum for 7 days Repeat this process in 1 month 30 g 3  .  lisinopril (ZESTRIL) 10 MG tablet Take 1 tablet (10 mg total) by mouth daily. 90 tablet 0  . albuterol (VENTOLIN HFA) 108 (90 Base) MCG/ACT inhaler Inhale 1-2 puffs into the lungs every 4 (four) hours as needed for wheezing or shortness of breath. (Patient not taking: Reported on 09/01/2019) 1 Inhaler 0   No facility-administered medications prior to visit.    Allergies  Allergen Reactions  . Lobster [Shellfish Allergy] Nausea Only  . Poison Ivy Extract Itching    ROS As per HPI  PE: Vitals with BMI 12/31/2019 12/13/2019 10/01/2019  Height 5\' 9"  5\' 9"  5' 9.5"  Weight 265 lbs 3 oz 264 lbs 263 lbs 3 oz  BMI 39.15 22.02 54.27  Systolic 062 376 283  Diastolic 80 96 83  Pulse 151 108 115  bp repeat manually 140/90   Gen: Alert, well appearing.  Patient is oriented to person, place, time, and situation. AFFECT: pleasant, lucid thought and speech. CV: Regular,tachy to 105, no m/r/g LUNGS: CTA bilat, nonlabored.  Occ rhonchi sounds that clear with coughing. EXT: no clubbing or cyanosis.  1+ bilat LL pitting edema.    LABS:  Lab Results  Component Value Date   TSH 2.58 03/10/2019   Lab Results  Component Value Date   WBC 8.1 03/10/2019   HGB 14.6 03/10/2019   HCT 41.7 03/10/2019   MCV 103.7 (H) 03/10/2019   PLT 201 03/10/2019   Lab Results  Component Value Date   CREATININE 0.67 09/17/2019   BUN 15 09/17/2019   NA 135 09/17/2019   K 4.7 09/17/2019   CL 99 09/17/2019   CO2 25 09/17/2019   Lab Results  Component Value Date   ALT 38 03/10/2019   AST 55 (H) 03/10/2019   ALKPHOS 86 03/03/2018   BILITOT 0.6 03/10/2019   Lab Results  Component Value Date   CHOL 121 03/10/2019   Lab Results  Component Value Date   HDL 52 03/10/2019   Lab Results  Component Value Date   LDLCALC 52 03/10/2019   Lab Results  Component Value Date   TRIG 86 03/10/2019   Lab Results  Component Value Date   CHOLHDL 2.3 03/10/2019   Lab Results  Component Value Date   PSA 0.2  03/10/2019   PSA 0.30 03/03/2018   PSA 0.40 02/26/2017   Lab Results  Component Value Date   HGBA1C 8.3 (A) 10/01/2019   HGBA1C 8.3 10/01/2019   HGBA1C 8.3 (A) 10/01/2019   HGBA1C 8.3 (A) 10/01/2019    IMPRESSION AND PLAN:  1) Adult ADD: doing well on methylphenidate CR 20mg  qd. CSC UTD, needs renewed at next f/u visit. No new rx's today.  When he calls/e-mails for RF request in a couple weeks I will eRx this med for 30d supply for each of the next 3 mo, appropriate fill on/after dates will be on rx's.  2) DM 2: no home monitoring. Last a1c 8.3%. He checked with insurer about SGLT-I meds and they all require prior auth. I'll start off with jardiance 10mg  qd.  Continue metformin 500 mg bid. HbA1c today.  Lytes/cr today.  3) HTN: not controlled as per measurements here today. However, I suspect his cold med does have a decongestant in it, plus I'd like some home bp measurements to compare to.  Encouraged regular home bp checks, write down numbers and review with me in 3 mo, earlier if >150/90 consistently.  RF'd lisinopril 10mg  qd today. Lytes/cr today.  4) HLD: tolerating crestor 40mg  qd. FLP today, hepatic panel today.  5) Viral URI with cough: symptomatic care discussed. Quarantine discussed.  He has been covid vaccinated. Looks well.  No covid testing recommended at this time.  An After Visit Summary was printed and given to the patient.  FOLLOW UP: Return in about 3 months (around 04/01/2020) for routine chronic illness f/u.  Signed:  Crissie Sickles, MD           12/31/2019

## 2020-01-03 ENCOUNTER — Telehealth: Payer: Self-pay

## 2020-01-03 NOTE — Telephone Encounter (Signed)
Noted  

## 2020-01-03 NOTE — Telephone Encounter (Signed)
Spoke with pt regarding labs and instructions. Pt will call to sched for education glucometer NV.

## 2020-01-03 NOTE — Telephone Encounter (Signed)
-----   Message from [name], MD sent at 01/02/2020  7:04 PM EDT ----- Cholesterol levels excellent. Electrolytes and kidney function normal. His sugar was 186, and his Hba1c is up from 8.3% last time to 9.9% this time. I rx'd jardiance at his recent visit so we'll see if prior auth needed. No matter which med I end up starting him on in the next couple days I want him to start checking his sugars at home.  Pls eRx glucometer with strips (check glucose twice per day: fasting in morning and again 2 hours after his largest meal of the day).  Write numbers down for review with me at an appt in the office in 1 month (fasting NOT necessary).   Also, tell him to write down the bp and heart rate checks that I talked about last visit and we'll review these at that time as well. -thx

## 2020-01-18 ENCOUNTER — Ambulatory Visit (INDEPENDENT_AMBULATORY_CARE_PROVIDER_SITE_OTHER): Payer: 59 | Admitting: Neurology

## 2020-01-18 ENCOUNTER — Other Ambulatory Visit: Payer: Self-pay

## 2020-01-18 DIAGNOSIS — G629 Polyneuropathy, unspecified: Secondary | ICD-10-CM

## 2020-01-18 NOTE — Progress Notes (Signed)
Follow-up Visit   Date: 01/18/20   Richard [name] MRN: [id number] DOB: 02-11-1956   Interim History: Richard [name] is a 64 y.o. right-handed Caucasian male with diabetes mellitus, hypertension, hyperlipidemia, ADHD returning to the clinic for follow-up of neuropathy.  The patient was accompanied to the clinic by self.  History of present illness: Starting in summer 2020, he began having tight sensation in the feet and by the winter, he noticed reduced sensation in the feet especially when using car pedals.  He feels as if he is walking on legos.  He also complaints of imbalance, which is worse on uneven ground or going down a ramp. He has numbness involving both feet and intermittent tingling/burning.   He drinks 2 glasses of wine nightly x 30 years.  He has been diabetic since July 2017, last HbA1c is 8.3.  UPDATE 01/18/2020:  He is here for EDX of the legs and to discuss results (see below).  Labs indicated thiamine deficiency and he has started supplementation.  He reports that the supplements are very foul tasting, but he has noticed some improvement in sensation.  No new complaints.   Medications:  Current Outpatient Medications on File Prior to Visit  Medication Sig Dispense Refill  . albuterol (VENTOLIN HFA) 108 (90 Base) MCG/ACT inhaler Inhale 1-2 puffs into the lungs every 4 (four) hours as needed for wheezing or shortness of breath. (Patient not taking: Reported on 09/01/2019) 1 Inhaler 0  . atorvastatin (LIPITOR) 40 MG tablet TAKE 1 TABLET BY MOUTH EVERY DAY 90 tablet 0  . azithromycin (ZITHROMAX) 500 MG tablet Take 500 mg by mouth daily.    . empagliflozin (JARDIANCE) 10 MG TABS tablet Take 1 tablet (10 mg total) by mouth daily before breakfast. 30 tablet 2  . lisinopril (ZESTRIL) 10 MG tablet Take 1 tablet (10 mg total) by mouth daily. 90 tablet 3  . metFORMIN (GLUCOPHAGE) 500 MG tablet TAKE 1 TABLET (500 MG TOTAL) BY MOUTH 2 (TWO) TIMES DAILY WITH A MEAL. 180 tablet 0   . methylphenidate (METADATE CD) 20 MG CR capsule Take 1 capsule (20 mg total) by mouth daily as needed. 30 capsule 0  . mupirocin ointment (BACTROBAN) 2 % 1 application twice a day to each nostril, each armpit, both groin creases, anal crease, and perineum for 7 days Repeat this process in 1 month 30 g 3   No current facility-administered medications on file prior to visit.    Allergies:  Allergies  Allergen Reactions  . Lobster [Shellfish Allergy] Nausea Only  . Poison Ivy Extract Itching    Vital Signs:  There were no vitals taken for this visit.  Neurological Exam: Deferred   Data: NCS/EMG of the legs 01/18/2020: The electrophysiologic findings are consistent with a sensorimotor polyneuropathy, with predominantly axonal features, affecting the lower extremities.   Labs 12/13/2019:  Vitamin B1 <7*,  ESR 31, CRP 1.4, SPEP no M protein  IMPRESSION/PLAN: Subacute onset of sensory predominant neuropathy affecting the feet, confirmed by EDX.  No active denervation is seen.  Risk factors:  Diabetes, alcohol use, thiamine deficiency Results of EMG and labs discussed.  Given that his onset was subacute and not insidious as expected with classic neuropathy, I offered CSF testing to be sure a immune-mediated neuropathy is not missed.  He would like to think about this and will contact the office, if he chooses to pursue testing. In the meantime, encouraged tight diabetes management, limiting alcohol, and continue vitamin B1 $RemoveBe'100mg'[id number]$ /d.  Thank you for allowing me to participate in patient's care.  If I can answer any additional questions, I would be pleased to do so.    Sincerely,    Tahani Potier K. Posey Pronto, DO

## 2020-01-18 NOTE — Procedures (Signed)
[facility]  [place] [place] [place]  [place], [place] [address] Tel: [phone] Fax:  [phone] Test Date:  01/18/2020  Patient: Richard [name] DOB: 07/04/1955 Physician: Narda Amber, DO  Sex: Male Height: 5\' 9"  Ref Phys: Narda Amber, DO  ID#: [id number]   Technician:    Patient Complaints: This is a 64 year old man referred for evaluation of bilateral feet paresthesias.  NCV & EMG Findings: Extensive electrodiagnostic testing of the right lower extremity and additional studies of the left shows:  1. Bilateral sural and superficial peroneal sensory responses are absent. 2. Bilateral peroneal motor responses are within normal limits.  Bilateral tibial motor responses show reduced amplitude (L2.6, R1.5 mV) and decreased conduction velocity (Knee-Ankle, L38, R37 m/s).   3. Bilateral tibial H reflex studies show prolonged latency.   4. Sparse chronic motor axonal changes are seen affecting bilateral medial gastrocnemius and flexor digitorum longus muscles, without accompanied active denervation.    Impression: The electrophysiologic findings are consistent with a sensorimotor polyneuropathy, with predominantly axonal features, affecting the lower extremities.    ___________________________ Narda Amber, DO    Nerve Conduction Studies Anti Sensory Summary Table   Stim Site NR Peak (ms) Norm Peak (ms) P-T Amp (V) Norm P-T Amp  Left Sup Peroneal Anti Sensory (Ant Lat Mall)  32C  12 cm NR  <4.6  >3  Right Sup Peroneal Anti Sensory (Ant Lat Mall)  12 cm NR  <4.6  >3  Left Sural Anti Sensory (Lat Mall)  32C  Calf NR  <4.6  >3  Right Sural Anti Sensory (Lat Mall)  Calf NR  <4.6  >3   Motor Summary Table   Stim Site NR Onset (ms) Norm Onset (ms) O-P Amp (mV) Norm O-P Amp Site1 Site2 Delta-0 (ms) Dist (cm) Vel (m/s) Norm Vel (m/s)  Left Peroneal Motor (Ext Dig Brev)  32C  Ankle    5.5 <6.0 2.5 >2.5 B Fib Ankle 7.9 37.0 47 >40  B Fib    13.4  1.5  Poplt B Fib  2.5 10.0 40 >40  Poplt    15.9  1.5         Right Peroneal Motor (Ext Dig Brev)  Ankle    4.5 <6.0 3.0 >2.5 B Fib Ankle 10.1 40.0 40 >40  B Fib    14.6  2.9  Poplt B Fib 2.1 9.0 43 >40  Poplt    16.7  3.0         Left Tibial Motor (Abd Hall Brev)  32C  Ankle    5.2 <6.0 2.6 >4 Knee Ankle 10.8 41.0 38 >40  Knee    16.0  2.3         Right Tibial Motor (Abd Hall Brev)  Ankle    6.0 <6.0 1.5 >4 Knee Ankle 11.7 43.0 37 >40  Knee    17.7  1.4          H Reflex Studies   NR H-Lat (ms) Lat Norm (ms) L-R H-Lat (ms)  Left Tibial (Gastroc)  32C     40.00 <35 0.00  Right Tibial (Gastroc)  32C     40.00 <35 0.00   EMG   Side Muscle Ins Act Fibs Psw Fasc Number Recrt Dur Dur. Amp Amp. Poly Poly. Comment  Left BicepsFemS Nml Nml Nml Nml Nml Nml Nml Nml Nml Nml Nml Nml N/A  Left AntTibialis Nml Nml Nml Nml Nml Nml Nml Nml Nml Nml Nml Nml N/A  Left Gastroc  Nml Nml Nml Nml 1- Rapid Few 1+ Few 1+ Nml Nml N/A  Left Flex Dig Long Nml Nml Nml Nml 1- Rapid Few 1+ Few 1+ Nml Nml N/A  Left RectFemoris Nml Nml Nml Nml Nml Nml Nml Nml Nml Nml Nml Nml N/A  Right AntTibialis Nml Nml Nml Nml Nml Nml Nml Nml Nml Nml Nml Nml N/A  Right BicepsFemS Nml Nml Nml Nml Nml Nml Nml Nml Nml Nml Nml Nml N/A  Right Gastroc Nml Nml Nml Nml 1- Rapid Few 1+ Few 1+ Nml Nml N/A  Right RectFemoris Nml Nml Nml Nml Nml Nml Nml Nml Nml Nml Nml Nml N/A  Right Flex Dig Long Nml Nml Nml Nml 1- Rapid Few 1+ Few 1+ Nml Nml N/A      Waveforms:

## 2020-01-19 ENCOUNTER — Other Ambulatory Visit: Payer: Self-pay

## 2020-01-19 ENCOUNTER — Telehealth: Payer: Self-pay | Admitting: Family Medicine

## 2020-01-19 DIAGNOSIS — E1129 Type 2 diabetes mellitus with other diabetic kidney complication: Secondary | ICD-10-CM

## 2020-01-19 DIAGNOSIS — R809 Proteinuria, unspecified: Secondary | ICD-10-CM

## 2020-01-19 MED ORDER — BLOOD GLUCOSE MONITOR KIT: PACK | 0 refills | Status: DC

## 2020-01-19 MED ORDER — ALBUTEROL SULFATE HFA 108 (90 BASE) MCG/ACT IN AERS: 1.0000 | INHALATION_SPRAY | RESPIRATORY_TRACT | 1 refills | Status: DC | PRN

## 2020-01-19 NOTE — Telephone Encounter (Signed)
Patient states at last visit, Dr. Anitra Lauth instructed him to get a glucose meter. Patient was under the impression this was a prescription item and needs help getting this. Please call patient to advise.

## 2020-01-19 NOTE — Telephone Encounter (Signed)
Patient advised we were supposed to send in a prescription for glucometer and supplies, checking sugar twice per day. I apologized for the delay. He voiced understanding. Rx faxed to pharmacy. I tried to explain how to use glucometer but told patient he could ask pharmacist if further assistance needed. He also inquired about refill for albuterol inhaler as well. Refill submitted. Patient made aware both will be available at the pharmacy.

## 2020-01-20 ENCOUNTER — Ambulatory Visit (INDEPENDENT_AMBULATORY_CARE_PROVIDER_SITE_OTHER): Payer: 59

## 2020-01-20 ENCOUNTER — Other Ambulatory Visit: Payer: Self-pay

## 2020-01-20 DIAGNOSIS — Z23 Encounter for immunization: Secondary | ICD-10-CM | POA: Diagnosis not present

## 2020-01-24 IMAGING — DX LEFT HAND - COMPLETE 3+ VIEW
4 series · 4 of 4 positions shown · non-contrast
Comparison: None.

CLINICAL DATA: Trauma, pain

EXAM:
LEFT HAND - COMPLETE 3+ VIEW

[hand pa]
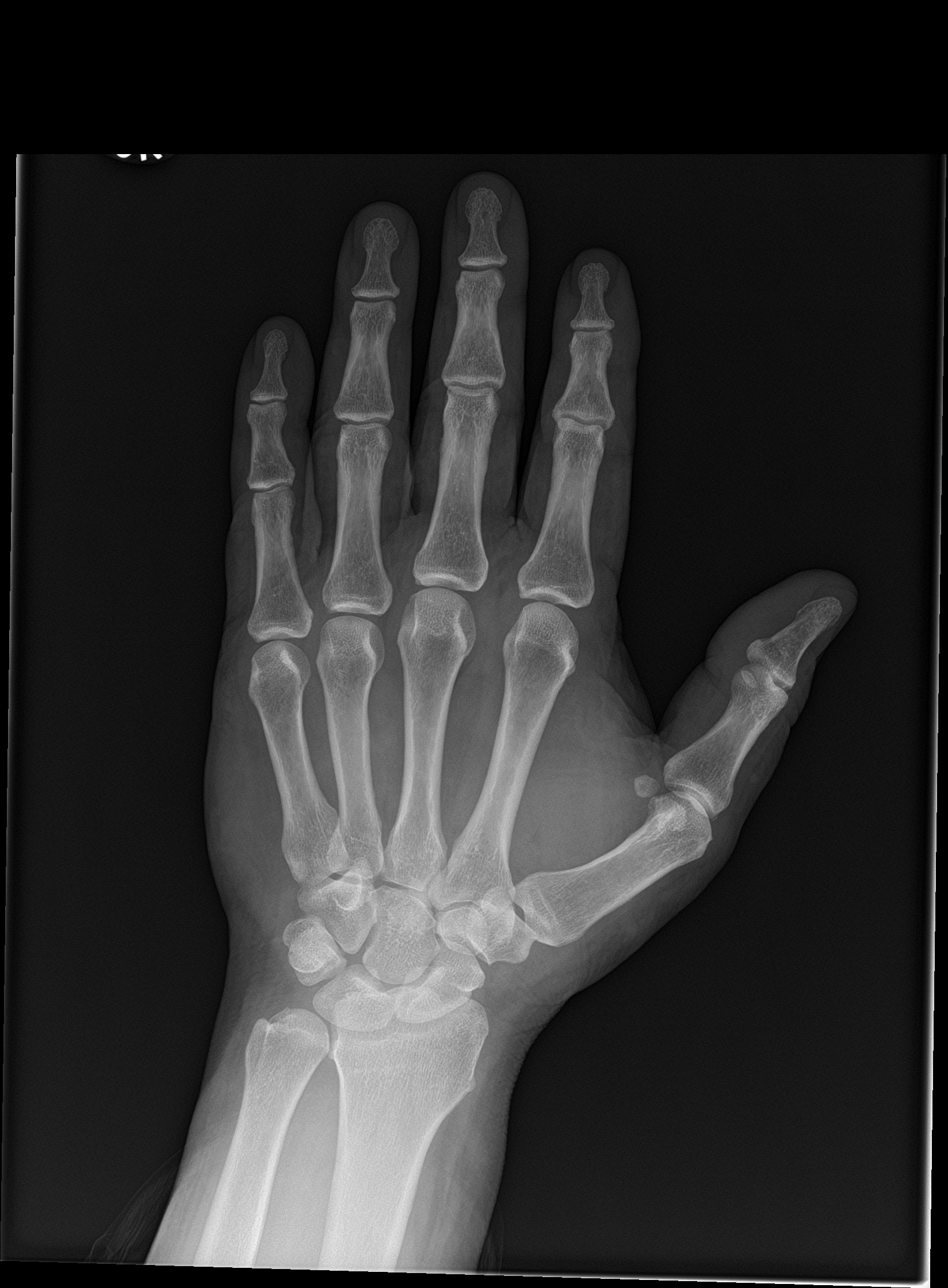

[hand obl]
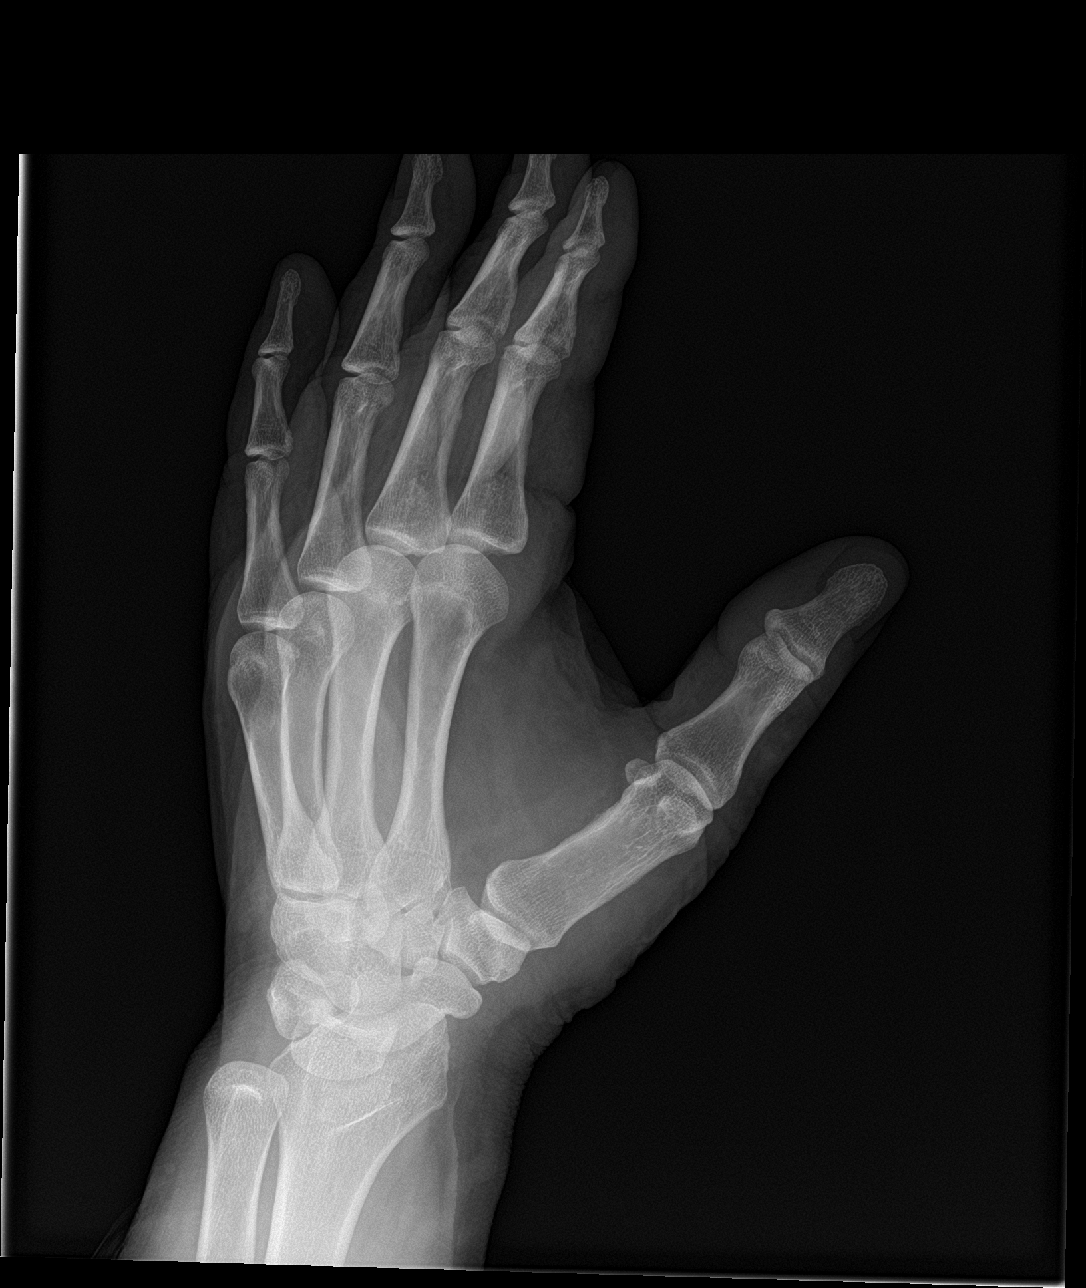

[hand lat]
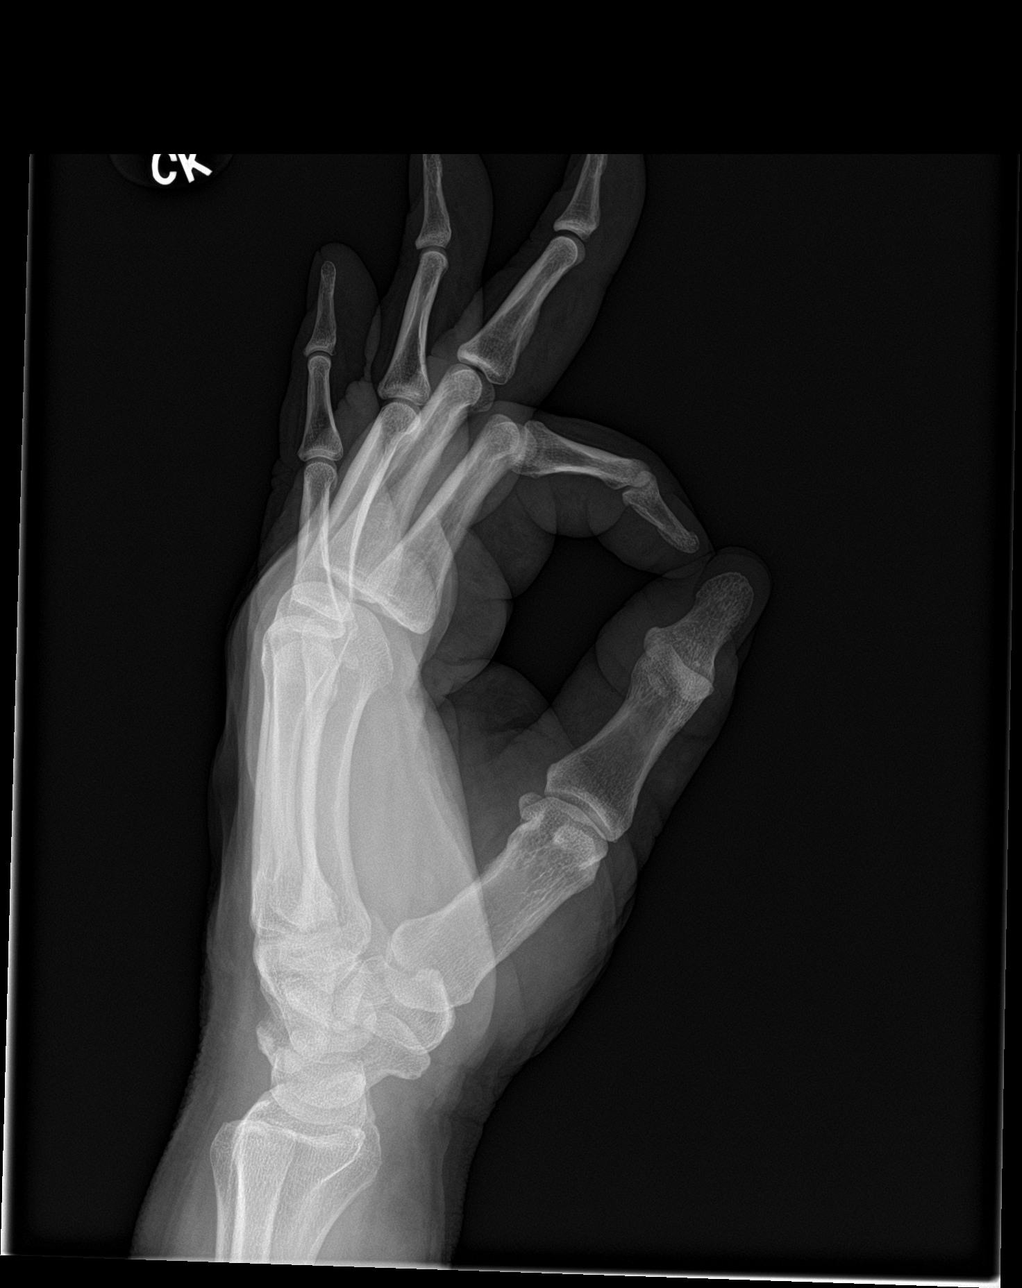

[wrist navicular]
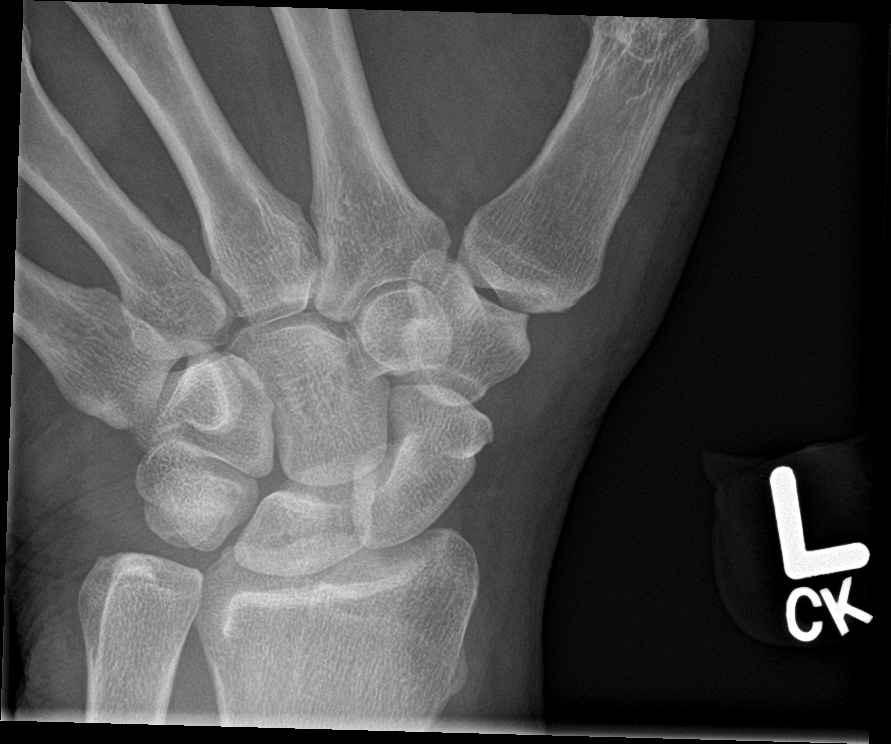

[4 of 4 positions shown; findings below may reference images not displayed]

FINDINGS: There is no evidence of fracture or dislocation. There is no
evidence of arthropathy or other focal bone abnormality. Soft
tissues are unremarkable.
IMPRESSION: No fracture or dislocation of the left hand. Joint spaces are well
preserved.

## 2020-01-31 ENCOUNTER — Other Ambulatory Visit: Payer: Self-pay | Admitting: Family Medicine

## 2020-02-02 ENCOUNTER — Ambulatory Visit: Payer: 59 | Admitting: Family Medicine

## 2020-02-03 MED ORDER — METHYLPHENIDATE HCL ER (CD) 20 MG PO CPCR: 20.0000 mg | ORAL_CAPSULE | Freq: Every day | ORAL | 0 refills | Status: DC | PRN

## 2020-02-03 MED ORDER — METHYLPHENIDATE HCL ER (CD) 20 MG PO CPCR
20.0000 mg | ORAL_CAPSULE | Freq: Every day | ORAL | 0 refills | Status: DC | PRN
Start: 2020-02-03 — End: 2020-04-11

## 2020-02-03 MED ORDER — METHYLPHENIDATE HCL ER (CD) 20 MG PO CPCR
20.0000 mg | ORAL_CAPSULE | Freq: Every day | ORAL | 0 refills | Status: DC | PRN
Start: 2020-02-03 — End: 2020-02-03

## 2020-02-03 NOTE — Telephone Encounter (Signed)
Requesting: Metadate Contract: UDS: Last Visit: Next Visit: Last Refill:10/01/19(30,0) electronic refill for August and Sept  Please Advise. Medication pending

## 2020-02-03 NOTE — Telephone Encounter (Signed)
OK, metadate rx's for each of the next 3 mo eRx'd today.

## 2020-02-18 ENCOUNTER — Other Ambulatory Visit: Payer: Self-pay

## 2020-02-23 ENCOUNTER — Ambulatory Visit (INDEPENDENT_AMBULATORY_CARE_PROVIDER_SITE_OTHER): Payer: 59 | Admitting: Family Medicine

## 2020-02-23 ENCOUNTER — Other Ambulatory Visit: Payer: Self-pay

## 2020-02-23 ENCOUNTER — Encounter: Payer: Self-pay | Admitting: Family Medicine

## 2020-02-23 VITALS — BP 135/73 | HR 80 | Temp 98.3°F | Resp 16 | Ht 69.0 in | Wt 259.2 lb

## 2020-02-23 DIAGNOSIS — E119 Type 2 diabetes mellitus without complications: Secondary | ICD-10-CM | POA: Diagnosis not present

## 2020-02-23 NOTE — Progress Notes (Signed)
OFFICE VISIT  02/23/2020  CC:  Chief Complaint  Patient presents with  . Follow-up    DM, HTN. Pt is fasting    HPI:    Patient is a 64 y.o. Caucasian male who presents for 2 mo f/u DM and HTN.  A/P as of last visit: "1) Adult ADD: doing well on methylphenidate CR 35m qd. CSC UTD, needs renewed at next f/u visit. No new rx's today.  When he calls/e-mails for RF request in a couple weeks I will eRx this med for 30d supply for each of the next 3 mo, appropriate fill on/after dates will be on rx's.  2) DM 2: no home monitoring. Last a1c 8.3%. He checked with insurer about SGLT-I meds and they all require prior auth. I'll start off with jardiance 167mqd.  Continue metformin 500 mg bid. HbA1c today.  Lytes/cr today.  3) HTN: not controlled as per measurements here today. However, I suspect his cold med does have a decongestant in it, plus I'd like some home bp measurements to compare to.  Encouraged regular home bp checks, write down numbers and review with me in 3 mo, earlier if >150/90 consistently.  RF'd lisinopril 1051md today. Lytes/cr today.  4) HLD: tolerating crestor 25m32m. FLP today, hepatic panel today.  5) Viral URI with cough: symptomatic care discussed. Quarantine discussed.  He has been covid vaccinated. Looks well.  No covid testing recommended at this time."  INTERIM HX: A1c up to 9.9% 2 mo ago, jardiance started, started bid home glucose monitoring.  He just started jardiance 1 week ago, avg fasting 125, avg 2h pp 135. He has cut back quite a bit on carbs. No probs with meds at this time.  No home bp monitoring lately at all.   PMP AWARE reviewed today: most recent rx for metadate CD was filled 02/03/20, # 30, 27 by me. No red flags.  Past Medical History:  Diagnosis Date  . Adult ADHD   . Allergic rhinitis   . Anxiety    claustrophobic  . Colon polyps 2012 and 2013   All hyperplastic except one adenomatous w/out high grade dysplasia  (recall 2023 per Dr. KaplDeatra Ina Elevated blood pressure reading without diagnosis of hypertension   . Elevated transaminase level 2017   Viral hep screening neg.  Suspect fatty liver.  . Hyperlipidemia    No improvement with TLC 2017  . Hypertension   . Obesity   . OSA on CPAP    Severe.  Wears CPAP but pt does not know his setting.  (Dr. WertMelvyn Novaslows him)  . Seborrheic dermatitis of scalp    and face: hydrocort 2% cream bid prn to face, and fluocinonide 0.5% sol'n to scalp bid prn per Dr. LuptAllyson Sabal2016)  . Type 2 diabetes mellitus with microalbuminuria (HCC) DM dx-09/27/2015. Microalb 2020    Past Surgical History:  Procedure Laterality Date  . COLONOSCOPY W/ POLYPECTOMY  04/2010; 10/2011   Initial screening TCS showed 6 polyps (one adenomatous).  Pt says he returned for repeat TCS 10/2011 showed one hyperplastic polyp: Recall 10 yrs per Dr. KaplDeatra InaMOUTH SURGERY      Outpatient Medications Prior to Visit  Medication Sig Dispense Refill  . ACCU-CHEK GUIDE test strip     . Accu-Chek Softclix Lancets lancets SMARTSIG:Topical    . albuterol (VENTOLIN HFA) 108 (90 Base) MCG/ACT inhaler Inhale 1-2 puffs into the lungs every 4 (four) hours as needed for wheezing or shortness of  breath. 18 g 1  . atorvastatin (LIPITOR) 40 MG tablet TAKE 1 TABLET BY MOUTH EVERY DAY 90 tablet 0  . blood glucose meter kit and supplies KIT Use to check sugar twice daily. 1 each 0  . empagliflozin (JARDIANCE) 10 MG TABS tablet Take 1 tablet (10 mg total) by mouth daily before breakfast. 30 tablet 2  . lisinopril (ZESTRIL) 10 MG tablet Take 1 tablet (10 mg total) by mouth daily. 90 tablet 3  . metFORMIN (GLUCOPHAGE) 500 MG tablet TAKE 1 TABLET (500 MG TOTAL) BY MOUTH 2 (TWO) TIMES DAILY WITH A MEAL. 180 tablet 0  . methylphenidate (METADATE CD) 20 MG CR capsule Take 1 capsule (20 mg total) by mouth daily as needed. 30 capsule 0  . mupirocin ointment (BACTROBAN) 2 % 1 application twice a day to each nostril, each  armpit, both groin creases, anal crease, and perineum for 7 days Repeat this process in 1 month (Patient not taking: Reported on 02/23/2020) 30 g 3  . azithromycin (ZITHROMAX) 500 MG tablet Take 500 mg by mouth daily. (Patient not taking: Reported on 02/23/2020)     No facility-administered medications prior to visit.    Allergies  Allergen Reactions  . Lobster [Shellfish Allergy] Nausea Only  . Poison Ivy Extract Itching    ROS As per HPI  PE: Vitals with BMI 02/23/2020 12/31/2019 12/13/2019  Height _0  _1  _2   Weight 259 lbs 3 oz 265 lbs 3 oz 264 lbs  BMI 38.26 88.91 69.45  Systolic 038 882 800  Diastolic 73 80 96  Pulse 80 110 108     Gen: Alert, well appearing.  Patient is oriented to person, place, time, and situation. AFFECT: pleasant, lucid thought and speech. No further exam today.  LABS:  Lab Results  Component Value Date   TSH 2.58 03/10/2019   Lab Results  Component Value Date   WBC 8.1 03/10/2019   HGB 14.6 03/10/2019   HCT 41.7 03/10/2019   MCV 103.7 (H) 03/10/2019   PLT 201 03/10/2019   Lab Results  Component Value Date   CREATININE 0.62 12/31/2019   BUN 13 12/31/2019   NA 132 (L) 12/31/2019   K 4.2 12/31/2019   CL 96 12/31/2019   CO2 25 12/31/2019   Lab Results  Component Value Date   ALT 34 12/31/2019   AST 63 (H) 12/31/2019   ALKPHOS 110 12/31/2019   BILITOT 0.6 12/31/2019   Lab Results  Component Value Date   CHOL 124 12/31/2019   Lab Results  Component Value Date   HDL 41.50 12/31/2019   Lab Results  Component Value Date   LDLCALC 52 12/31/2019   Lab Results  Component Value Date   TRIG 156.0 (H) 12/31/2019   Lab Results  Component Value Date   CHOLHDL 3 12/31/2019   Lab Results  Component Value Date   PSA 0.2 03/10/2019   PSA 0.30 03/03/2018   PSA 0.40 02/26/2017   Lab Results  Component Value Date   HGBA1C 9.9 (H) 12/31/2019   IMPRESSION AND PLAN:  DM 2; glucoses improving lately with addition of  jardiance 29m qd plus some dietary changes. Cont home gluc monitoring and jardiance 134mqd + metformin 500 mg bid.  No repeat labs due today.  An After Visit Summary was printed and given to the patient.  FOLLOW UP: Return for 6-8 wk f/u dm/hld (will do fasting labs).  Signed:  PhCrissie SicklesMD  02/23/2020

## 2020-03-06 ENCOUNTER — Telehealth: Payer: Self-pay | Admitting: Family Medicine

## 2020-03-06 NOTE — Telephone Encounter (Signed)
Patient states Vania Rea has become too expensive at current pharmacy. Please send RX to: [facility], phone [phone]. Patient states he is currently out of medication.

## 2020-03-07 ENCOUNTER — Other Ambulatory Visit: Payer: Self-pay

## 2020-03-07 ENCOUNTER — Other Ambulatory Visit: Payer: Self-pay | Admitting: Family Medicine

## 2020-03-07 MED ORDER — EMPAGLIFLOZIN 10 MG PO TABS: 10.0000 mg | ORAL_TABLET | Freq: Every day | ORAL | 1 refills | Status: DC

## 2020-03-07 NOTE — Telephone Encounter (Signed)
[facility]. Wife (DPR) notified and advised to speak with the pharmacy about what can be done in the mean time while waiting on [facility].

## 2020-04-07 ENCOUNTER — Other Ambulatory Visit: Payer: Self-pay

## 2020-04-07 ENCOUNTER — Encounter: Payer: Self-pay | Admitting: Neurology

## 2020-04-07 ENCOUNTER — Telehealth (INDEPENDENT_AMBULATORY_CARE_PROVIDER_SITE_OTHER): Payer: 59 | Admitting: Neurology

## 2020-04-07 VITALS — Ht 69.0 in | Wt 260.0 lb

## 2020-04-07 DIAGNOSIS — E519 Thiamine deficiency, unspecified: Secondary | ICD-10-CM | POA: Diagnosis not present

## 2020-04-07 DIAGNOSIS — E1142 Type 2 diabetes mellitus with diabetic polyneuropathy: Secondary | ICD-10-CM | POA: Diagnosis not present

## 2020-04-07 DIAGNOSIS — G629 Polyneuropathy, unspecified: Secondary | ICD-10-CM | POA: Diagnosis not present

## 2020-04-07 NOTE — Progress Notes (Signed)
   Virtual Visit via Video Note The purpose of this virtual visit is to provide medical care while limiting exposure to the novel coronavirus.    Consent was obtained for video visit:  Yes.   Answered questions that patient had about telehealth interaction:  Yes.   I discussed the limitations, risks, security and privacy concerns of performing an evaluation and management service by telemedicine. I also discussed with the patient that there may be a patient responsible charge related to this service. The patient expressed understanding and agreed to proceed.  Pt location: Home Physician Location: office Name of referring provider:  [name], MD I connected with Richard [name] at patients initiation/request on 04/07/2020 at 12:50 PM EST by video enabled telemedicine application and verified that I am speaking with the correct person using two identifiers. Pt MRN:  [id number] Pt DOB:  July 10, 1955 Video Participants:  Richard [name]   History of Present Illness: This is a 65 y.o. male returning for follow-up of peripheral neuropathy. He underwent electrodiagnostic testing which was consistent with axonal neuropathy affecting the legs.  Labs notable for thiamine deficiency. Since starting thiamine supplementation, his legs feel slightly better and less painful.  His primary concern is ongoing issues with balance and concern for falls.  Fortunately, he has not suffered a fall ever since he was last here but is very cautious, especially on stairs.   Observations/Objective:   Vitals:   04/07/20 1113  Weight: 260 lb (117.9 kg)  Height: 5\' 9"  (1.753 m)   Patient is awake, alert, and appears comfortable.  Oriented x 4.   Extraocular muscles are intact. No ptosis.  Face is symmetric.   Antigravity in all extremities.    Assessment and Plan:  Peripheral neuropathy contributed by diabetes, thiamine deficiency, and alcohol use manifesting with sensory loss (numbness) and ataxia. I had  extensive discussion with the patient regarding the pathogenesis, etiology, management, and natural course of neuropathy. Neuropathy tends to be slowly progressive.  It is important to keep sugars under optimal control and limit alcohol to minimize progression. For thiamine deficiency, continue thiamine 100mg  daily. Discussed physical therapy for balance training and he will contact my office, if he chooses to proceed.  He has exercise that he may do from doing PT in the past, so will try to be more compliant with this. Patient educated on daily foot inspection, fall prevention, and safety precautions around the home.       Follow Up Instructions:   I discussed the assessment and treatment plan with the patient. The patient was provided an opportunity to ask questions and all were answered. The patient agreed with the plan and demonstrated an understanding of the instructions.   The patient was advised to call back or seek an in-person evaluation if the symptoms worsen or if the condition fails to improve as anticipated.  Follow-up as needed    Alda Berthold, DO

## 2020-04-10 ENCOUNTER — Other Ambulatory Visit: Payer: Self-pay

## 2020-04-11 ENCOUNTER — Encounter: Payer: Self-pay | Admitting: Family Medicine

## 2020-04-11 ENCOUNTER — Ambulatory Visit (INDEPENDENT_AMBULATORY_CARE_PROVIDER_SITE_OTHER): Payer: 59 | Admitting: Family Medicine

## 2020-04-11 VITALS — BP 148/82 | HR 106 | Temp 98.3°F | Resp 16 | Ht 69.0 in | Wt 258.4 lb

## 2020-04-11 DIAGNOSIS — N529 Male erectile dysfunction, unspecified: Secondary | ICD-10-CM

## 2020-04-11 DIAGNOSIS — F988 Other specified behavioral and emotional disorders with onset usually occurring in childhood and adolescence: Secondary | ICD-10-CM

## 2020-04-11 DIAGNOSIS — R809 Proteinuria, unspecified: Secondary | ICD-10-CM

## 2020-04-11 DIAGNOSIS — E7800 Pure hypercholesterolemia, unspecified: Secondary | ICD-10-CM | POA: Diagnosis not present

## 2020-04-11 DIAGNOSIS — I10 Essential (primary) hypertension: Secondary | ICD-10-CM | POA: Diagnosis not present

## 2020-04-11 DIAGNOSIS — E1129 Type 2 diabetes mellitus with other diabetic kidney complication: Secondary | ICD-10-CM | POA: Diagnosis not present

## 2020-04-11 LAB — BASIC METABOLIC PANEL
BUN: 17 mg/dL (ref 6–23)
CO2: 26 mEq/L (ref 19–32)
Calcium: 10.8 mg/dL — ABNORMAL HIGH (ref 8.4–10.5)
Chloride: 98 mEq/L (ref 96–112)
Creatinine, Ser: 0.67 mg/dL (ref 0.40–1.50)
GFR: 99.01 mL/min (ref >60.00)
Glucose, Bld: 128 mg/dL — ABNORMAL HIGH (ref 70–99)
Potassium: 4.8 mEq/L (ref 3.5–5.1)
Sodium: 134 mEq/L — ABNORMAL LOW (ref 135–145)

## 2020-04-11 LAB — HEMOGLOBIN A1C: Hgb A1c MFr Bld: 7.2 % — ABNORMAL HIGH (ref 4.6–6.5)

## 2020-04-11 MED ORDER — METHYLPHENIDATE HCL ER (CD) 20 MG PO CPCR: 20.0000 mg | ORAL_CAPSULE | Freq: Every day | ORAL | 0 refills | Status: DC | PRN

## 2020-04-11 NOTE — Progress Notes (Signed)
OFFICE VISIT  04/11/2020  CC:  Chief Complaint  Patient presents with  . Follow-up    DM, pt is fasting   HPI:    Patient is a 65 y.o. Caucasian male who presents for 6 wk f/u DM. Main chronic issues are DM, HTN, HLD, and adult ADD. A/P as of last visit: "DM 2; glucoses improving lately with addition of jardiance 34m qd plus some dietary changes. Cont home gluc monitoring and jardiance 118mqd + metformin 500 mg bid."  INTERIM HX: Feeling well. Has some dec ability to maintain erections lately, thinks maybe it came on after starting one of his meds but not sure which one (not a gradual/insidious onset over months/years).  Libido intact.   DM: has been on jardiance 1068mor about 3 mo now (also metformin 500 mg bid).  Home glucoses : last two months fasting avg 140 and 2H PP avg 150 .  No hypoglycemia.  Was off jardiance for a week and glucoses were definitely higher.  HTN: takes lisinopril 70m24m, no home bp monitoring.  Pt states all is going well with the med at current dosing--methylphenidate cd 20mg73m much improved focus, concentration, task completion.  Less frustration, better multitasking, less impulsivity and restlessness.  Mood is stable. No side effects from the medication.  PMP AWARE reviewed today: most recent rx for methylphenidate cd 20mg 12mfilled 03/07/20, # 30, rx by me. No red flags.  Past Medical History:  Diagnosis Date  . Adult ADHD   . Allergic rhinitis   . Anxiety    claustrophobic  . Colon polyps 2012 and 2013   All hyperplastic except one adenomatous w/out high grade dysplasia (recall 2023 per Dr. KaplanDeatra Inalevated blood pressure reading without diagnosis of hypertension   . Elevated transaminase level 2017   Viral hep screening neg.  Suspect fatty liver.  . Hyperlipidemia    No improvement with TLC 2017  . Hypertension   . Obesity   . OSA on CPAP    Severe.  Wears CPAP but pt does not know his setting.  (Dr. Wert fMelvyn Novasws him)  .  Seborrheic dermatitis of scalp    and face: hydrocort 2% cream bid prn to face, and fluocinonide 0.5% sol'n to scalp bid prn per Dr. LuptonAllyson Sabal16)  . Type 2 diabetes mellitus with microalbuminuria (HCC) DM dx-09/27/2015. Microalb 2020    Past Surgical History:  Procedure Laterality Date  . COLONOSCOPY W/ POLYPECTOMY  04/2010; 10/2011   Initial screening TCS showed 6 polyps (one adenomatous).  Pt says he returned for repeat TCS 10/2011 showed one hyperplastic polyp: Recall 10 yrs per Dr. KaplanDeatra InaUTH SURGERY      Outpatient Medications Prior to Visit  Medication Sig Dispense Refill  . Accu-Chek Softclix Lancets lancets SMARTSIG:Topical    . albuterol (VENTOLIN HFA) 108 (90 Base) MCG/ACT inhaler Inhale 1-2 puffs into the lungs every 4 (four) hours as needed for wheezing or shortness of breath. 18 g 1  . atorvastatin (LIPITOR) 40 MG tablet TAKE 1 TABLET BY MOUTH EVERY DAY 90 tablet 0  . empagliflozin (JARDIANCE) 10 MG TABS tablet Take 1 tablet (10 mg total) by mouth daily before breakfast. 90 tablet 1  . glucose blood (ACCU-CHEK GUIDE) test strip USE TO TEST BLOOD SUGAR TWICE DAILY 200 strip 0  . lisinopril (ZESTRIL) 10 MG tablet Take 1 tablet (10 mg total) by mouth daily. 90 tablet 3  . metFORMIN (GLUCOPHAGE) 500 MG tablet TAKE 1 TABLET (  500 MG TOTAL) BY MOUTH 2 (TWO) TIMES DAILY WITH A MEAL. 180 tablet 0  . mupirocin ointment (BACTROBAN) 2 % 1 application twice a day to each nostril, each armpit, both groin creases, anal crease, and perineum for 7 days Repeat this process in 1 month 30 g 3  . methylphenidate (METADATE CD) 20 MG CR capsule Take 1 capsule (20 mg total) by mouth daily as needed. 30 capsule 0  . blood glucose meter kit and supplies KIT Use to check sugar twice daily. (Patient not taking: Reported on 04/11/2020) 1 each 0   No facility-administered medications prior to visit.    Allergies  Allergen Reactions  . Lobster [Shellfish Allergy] Nausea Only  . Poison Ivy Extract  Itching    ROS As per HPI  PE: Vitals with BMI 04/11/2020 04/07/2020 02/23/2020  Height 5' 9"  5' 9"  5' 9"   Weight 258 lbs 6 oz 260 lbs 259 lbs 3 oz  BMI 38.14 77.82 42.35  Systolic 361 - 443  Diastolic 82 - 73  Pulse 154 - 80    Gen: Alert, well appearing.  Patient is oriented to person, place, time, and situation. AFFECT: pleasant, lucid thought and speech. No further exam today.  LABS:    Chemistry      Component Value Date/Time   NA 132 (L) 12/31/2019 1338   K 4.2 12/31/2019 1338   CL 96 12/31/2019 1338   CO2 25 12/31/2019 1338   BUN 13 12/31/2019 1338   CREATININE 0.62 12/31/2019 1338   CREATININE 0.63 (L) 03/10/2019 1436      Component Value Date/Time   CALCIUM 9.7 12/31/2019 1338   ALKPHOS 110 12/31/2019 1338   AST 63 (H) 12/31/2019 1338   ALT 34 12/31/2019 1338   BILITOT 0.6 12/31/2019 1338     Lab Results  Component Value Date   WBC 8.1 03/10/2019   HGB 14.6 03/10/2019   HCT 41.7 03/10/2019   MCV 103.7 (H) 03/10/2019   PLT 201 03/10/2019   Lab Results  Component Value Date   TSH 2.58 03/10/2019   Lab Results  Component Value Date   CHOL 124 12/31/2019   HDL 41.50 12/31/2019   LDLCALC 52 12/31/2019   LDLDIRECT 139.9 06/13/2011   TRIG 156.0 (H) 12/31/2019   CHOLHDL 3 12/31/2019   Lab Results  Component Value Date   HGBA1C 9.9 (H) 12/31/2019    IMPRESSION AND PLAN:  1) DM 2, improved control.  Jardiance 34m added to metfomin 5029mbid 3 mo ago. Hba1c and lytes/cr today.  Will do feet exam and Urine microalb/cr at next check in 3 mo. OK to cut back on frequency of home gluc checks to qod or so.  2) HTN, bp elevated today. OK to start home bp monitoring and have some numbers to compare to at next f/u visit.  Cont lisin 10110md. BMET today.  3) ED; maintenance issue. Possibly related to med (lisin?, ?jardiance) vs vascular. Pt wants to hold off on med holiday trial-->obs for now.  4) HLD: tolerating atorva 8m37m. LDL 52 about 3 mo  ago. Plan recheck flp and hepatic panel in 3 mo.  5) Adult ADD: doing well on methylphenidate cd 20mg81m Has rx for this month at pharmacy on hold. Sent in rx today for Feb for pharmacy to hold.  An After Visit Summary was printed and given to the patient.  FOLLOW UP: Return in about 3 months (around 07/10/2020) for annual CPE (fasting) + RCI.  Signed:  Crissie Sickles, MD           04/11/2020

## 2020-04-14 ENCOUNTER — Ambulatory Visit: Payer: 59 | Admitting: Neurology

## 2020-05-24 ENCOUNTER — Other Ambulatory Visit: Payer: Self-pay

## 2020-05-24 MED ORDER — METFORMIN HCL 500 MG PO TABS: 500.0000 mg | ORAL_TABLET | Freq: Two times a day (BID) | ORAL | 0 refills | Status: DC

## 2020-05-24 MED ORDER — ATORVASTATIN CALCIUM 40 MG PO TABS: 40.0000 mg | ORAL_TABLET | Freq: Every day | ORAL | 0 refills | Status: DC

## 2020-05-25 ENCOUNTER — Other Ambulatory Visit: Payer: Self-pay | Admitting: Family Medicine

## 2020-05-26 MED ORDER — METHYLPHENIDATE HCL ER (CD) 20 MG PO CPCR: 20.0000 mg | ORAL_CAPSULE | Freq: Every day | ORAL | 0 refills | Status: DC | PRN

## 2020-05-26 NOTE — Telephone Encounter (Signed)
Requesting: methylphenidate Contract: 02/23/20 UDS: 09/04/18 Last Visit: 04/11/20 Next Visit: 07/11/20 Last Refill: 04/11/20 (30,0) electronic refill for Feb  Please Advise. Medication pending

## 2020-05-29 ENCOUNTER — Telehealth: Payer: Self-pay

## 2020-05-29 ENCOUNTER — Other Ambulatory Visit: Payer: Self-pay

## 2020-05-29 MED ORDER — LISINOPRIL 10 MG PO TABS: 10.0000 mg | ORAL_TABLET | Freq: Every day | ORAL | 1 refills | Status: DC

## 2020-05-29 MED ORDER — ALBUTEROL SULFATE HFA 108 (90 BASE) MCG/ACT IN AERS: 1.0000 | INHALATION_SPRAY | RESPIRATORY_TRACT | 1 refills | Status: DC | PRN

## 2020-05-29 MED ORDER — ALBUTEROL SULFATE HFA 108 (90 BASE) MCG/ACT IN AERS
1.0000 | INHALATION_SPRAY | RESPIRATORY_TRACT | 1 refills | Status: DC | PRN
Start: 2020-05-29 — End: 2020-05-29

## 2020-05-29 NOTE — Telephone Encounter (Signed)
LM for pt to return call regarding rx. Refill sent for lisinopril 90 day supply with 1 refill, all other medications are current with [facility].

## 2020-05-29 NOTE — Telephone Encounter (Signed)
Patient calling in regards to phone call he rec'd over the weekend from his new mail order pharmacy "[facility]'.  [facility] stated that [facility] stated they have sent in a request to have meds filled and have not rec'd approval from his provider.  I told patient that I could not see a request from them but if it happen over the weekend, it should have been placed on Dr. Idelle Leech desk for approval to fill meds. He requested for medication to be approved and sent to [facility]. And for any other meds he is taking, place prescription on file, until he needs. Patient can be reached at [phone].   lisinopril (ZESTRIL) 10 MG tablet [[id number]]   [place], [place]

## 2020-05-30 ENCOUNTER — Other Ambulatory Visit: Payer: Self-pay | Admitting: Family Medicine

## 2020-05-30 NOTE — Telephone Encounter (Signed)
MyChart message read.

## 2020-07-05 ENCOUNTER — Other Ambulatory Visit: Payer: Self-pay

## 2020-07-05 NOTE — Telephone Encounter (Signed)
Patient wife, [name] [facility]) calling regarding medication that is having some issues with getting refilled.   methylphenidate (METADATE CD) 20 MG CR capsule [[id number]]   1) [facility] not [facility] any in [facility].   2) requiring Prior Authorization   Please send to [place]

## 2020-07-06 MED ORDER — METHYLPHENIDATE HCL ER (CD) 20 MG PO CPCR: 20.0000 mg | ORAL_CAPSULE | Freq: Every day | ORAL | 0 refills | Status: DC | PRN

## 2020-07-06 NOTE — Telephone Encounter (Signed)
Patient notified PA still pending review, we will receive determination notice by fax.

## 2020-07-06 NOTE — Telephone Encounter (Signed)
Spoke with patient regarding new PA completion and [facility] d supply [facility] different [facility] due to being [facility] of [facility]. Will call patient back to notify if PA approved yet

## 2020-07-06 NOTE — Telephone Encounter (Signed)
New PA submitted thru rx benefits portal instead of covermymeds due to pt's insurance. Out of pocket cost of med is $170. [facility] [place] [facility] d [facility].   Contract: 02/23/20 UDS: 09/04/18 Last Visit: 04/11/20 Next Visit: 07/11/20 Last Refill: 05/26/20 (30,0)  Please Advise. Medication pending

## 2020-07-07 NOTE — Telephone Encounter (Signed)
PA approval notification received by fax. Pt notified med coverage approval

## 2020-07-11 ENCOUNTER — Encounter: Payer: Self-pay | Admitting: Family Medicine

## 2020-07-11 ENCOUNTER — Other Ambulatory Visit: Payer: Self-pay

## 2020-07-11 ENCOUNTER — Ambulatory Visit (INDEPENDENT_AMBULATORY_CARE_PROVIDER_SITE_OTHER): Payer: 59 | Admitting: Family Medicine

## 2020-07-11 VITALS — BP 120/72 | HR 95 | Temp 98.6°F | Resp 16 | Ht 69.5 in | Wt 259.4 lb

## 2020-07-11 DIAGNOSIS — Z125 Encounter for screening for malignant neoplasm of prostate: Secondary | ICD-10-CM | POA: Diagnosis not present

## 2020-07-11 DIAGNOSIS — E7800 Pure hypercholesterolemia, unspecified: Secondary | ICD-10-CM

## 2020-07-11 DIAGNOSIS — I10 Essential (primary) hypertension: Secondary | ICD-10-CM | POA: Diagnosis not present

## 2020-07-11 DIAGNOSIS — R809 Proteinuria, unspecified: Secondary | ICD-10-CM

## 2020-07-11 DIAGNOSIS — Z0000 Encounter for general adult medical examination without abnormal findings: Secondary | ICD-10-CM | POA: Diagnosis not present

## 2020-07-11 DIAGNOSIS — E1129 Type 2 diabetes mellitus with other diabetic kidney complication: Secondary | ICD-10-CM | POA: Diagnosis not present

## 2020-07-11 DIAGNOSIS — Z1211 Encounter for screening for malignant neoplasm of colon: Secondary | ICD-10-CM

## 2020-07-11 DIAGNOSIS — Z23 Encounter for immunization: Secondary | ICD-10-CM | POA: Diagnosis not present

## 2020-07-11 LAB — PSA: PSA: 0.25 ng/mL (ref 0.10–4.00)

## 2020-07-11 LAB — COMPREHENSIVE METABOLIC PANEL
ALT: 25 U/L (ref 0–53)
AST: 33 U/L (ref 0–37)
Albumin: 4.3 g/dL (ref 3.5–5.2)
Alkaline Phosphatase: 99 U/L (ref 39–117)
BUN: 19 mg/dL (ref 6–23)
CO2: 23 mEq/L (ref 19–32)
Calcium: 10.2 mg/dL (ref 8.4–10.5)
Chloride: 100 mEq/L (ref 96–112)
Creatinine, Ser: 0.66 mg/dL (ref 0.40–1.50)
GFR: 99.28 mL/min (ref >60.00)
Glucose, Bld: 104 mg/dL — ABNORMAL HIGH (ref 70–99)
Potassium: 4.7 mEq/L (ref 3.5–5.1)
Sodium: 135 mEq/L (ref 135–145)
Total Bilirubin: 0.6 mg/dL (ref 0.2–1.2)
Total Protein: 7.5 g/dL (ref 6.0–8.3)

## 2020-07-11 LAB — LIPID PANEL
Cholesterol: 122 mg/dL (ref 0–200)
HDL: 55.4 mg/dL (ref >39.00)
LDL Cholesterol: 48 mg/dL (ref 0–99)
NonHDL: 66.68
Total CHOL/HDL Ratio: 2
Triglycerides: 92 mg/dL (ref 0.0–149.0)
VLDL: 18.4 mg/dL (ref 0.0–40.0)

## 2020-07-11 LAB — CBC WITH DIFFERENTIAL/PLATELET
Basophils Absolute: 0.1 10*3/uL (ref 0.0–0.1)
Basophils Relative: 0.8 % (ref 0.0–3.0)
Eosinophils Absolute: 0.2 10*3/uL (ref 0.0–0.7)
Eosinophils Relative: 2.5 % (ref 0.0–5.0)
HCT: 42.8 % (ref 39.0–52.0)
Hemoglobin: 14.5 g/dL (ref 13.0–17.0)
Lymphocytes Relative: 18.7 % (ref 12.0–46.0)
Lymphs Abs: 1.3 10*3/uL (ref 0.7–4.0)
MCHC: 33.9 g/dL (ref 30.0–36.0)
MCV: 105.8 fl — ABNORMAL HIGH (ref 78.0–100.0)
Monocytes Absolute: 0.6 10*3/uL (ref 0.1–1.0)
Monocytes Relative: 8.6 % (ref 3.0–12.0)
Neutro Abs: 4.9 10*3/uL (ref 1.4–7.7)
Neutrophils Relative %: 69.4 % (ref 43.0–77.0)
Platelets: 194 10*3/uL (ref 150.0–400.0)
RBC: 4.05 Mil/uL — ABNORMAL LOW (ref 4.22–5.81)
RDW: 14.3 % (ref 11.5–15.5)
WBC: 7 10*3/uL (ref 4.0–10.5)

## 2020-07-11 LAB — MICROALBUMIN / CREATININE URINE RATIO
Creatinine,U: 77.1 mg/dL
Microalb Creat Ratio: 9.9 mg/g (ref 0.0–30.0)
Microalb, Ur: 7.7 mg/dL — ABNORMAL HIGH (ref 0.0–1.9)

## 2020-07-11 LAB — HEMOGLOBIN A1C: Hgb A1c MFr Bld: 7.1 % — ABNORMAL HIGH (ref 4.6–6.5)

## 2020-07-11 NOTE — Patient Instructions (Signed)

## 2020-07-11 NOTE — Progress Notes (Signed)
Office Note 07/11/2020  CC:  Chief Complaint  Patient presents with  . Annual Exam    fasting    HPI:  Richard [name] is a 65 y.o. White male who is here for annual health maintenance exam and f/u DM 2, HTN, HLD. A/P as of last visit: "1) DM 2, improved control.  Jardiance 10mg  added to metfomin 500mg  bid 3 mo ago. Hba1c and lytes/cr today.  Will do feet exam and Urine microalb/cr at next check in 3 mo. OK to cut back on frequency of home gluc checks to qod or so.  2) HTN, bp elevated today. OK to start home bp monitoring and have some numbers to compare to at next f/u visit.  Cont lisin 10mg  qd. BMET today.  3) ED; maintenance issue. Possibly related to med (lisin?, ?jardiance) vs vascular. Pt wants to hold off on med holiday trial-->obs for now.  4) HLD: tolerating atorva 40mg  qd. LDL 52 about 3 mo ago. Plan recheck flp and hepatic panel in 3 mo.  5) Adult ADD: doing well on methylphenidate cd 20mg  qd. Has rx for this month at pharmacy on hold. Sent in rx today for Feb for pharmacy to hold."  INTERIM HX: Doing fine, no acute complaints.  No home bp monitoring.  DM: Hba1c down to 7.2% last check after having added jardiance 10mg  qd to his metformin. Avg fasting gluc 129, max 157, min 109.  No postprandial checks. Any burning, tingling, or numbness of feet?->no.   PMP AWARE reviewed today: most recent rx for metadate cd 20mg  was filled 07/06/20, # 30, rx by me. No red flags.  Past Medical History:  Diagnosis Date  . Adult ADHD   . Allergic rhinitis   . Anxiety    claustrophobic  . Colon polyps 2012 and 2013   All hyperplastic except one adenomatous w/out high grade dysplasia (recall 2023 per Dr. Deatra Ina)  . Elevated transaminase level 2017   Viral hep screening neg.  Suspect fatty liver.  . Hyperlipidemia   . Hypertension   . Obesity   . OSA on CPAP    Severe.  Wears CPAP but pt does not know his setting.  (Dr. Melvyn Novas follows him)  . Seborrheic  dermatitis of scalp    and face: hydrocort 2% cream bid prn to face, and fluocinonide 0.5% sol'n to scalp bid prn per Dr. Allyson Sabal (04/2014)  . Type 2 diabetes mellitus with microalbuminuria ([facility]) DM dx-09/27/2015. Microalb 2020    Past Surgical History:  Procedure Laterality Date  . COLONOSCOPY W/ POLYPECTOMY  04/2010; 10/2011   Initial screening TCS showed 6 polyps (one adenomatous).  Pt says he returned for repeat TCS 10/2011 showed one hyperplastic polyp: Recall 10 yrs per Dr. Deatra Ina  . MOUTH SURGERY      Family History  Problem Relation Age of Onset  . Lung cancer Father   . Cancer Father        nasal  . Allergies Father   . Colon cancer Neg Hx   . Esophageal cancer Neg Hx   . Rectal cancer Neg Hx   . Stomach cancer Neg Hx     Social History   Socioeconomic History  . Marital status: Married    Spouse name: Not on file  . Number of children: Not on file  . Years of education: Not on file  . Highest education level: Not on file  Occupational History  . Occupation: Counselling psychologist  Tobacco Use  . Smoking status: Former  Smoker    Packs/day: 1.00    Types: Cigarettes    Quit date: 07/17/2002    Years since quitting: 17.9  . Smokeless tobacco: Never Used  Vaping Use  . Vaping Use: Never used  Substance and Sexual Activity  . Alcohol use: Yes    Alcohol/week: 7.0 standard drinks    Types: 7 Standard drinks or equivalent per week    Comment: wine every night with dinner  . Drug use: No  . Sexual activity: Not on file  Other Topics Concern  . Not on file  Social History Narrative   Married, 1 child--grown.   Occupation: Financial planner"   Tobacco: 20 pack-yr hx; quit 2004.   Alcohol: nightly cocktail   Drugs: none   Exercise: "walk some when I can".   Diet: normal.   Right Handed. Used to be left handed   Three story home   Drinks a lot of tea and occasional coffee   Social Determinants of Radio broadcast assistant Strain: Not on file  Food Insecurity:  Not on file  Transportation Needs: Not on file  Physical Activity: Not on file  Stress: Not on file  Social Connections: Not on file  Intimate Partner Violence: Not on file    Outpatient Medications Prior to Visit  Medication Sig Dispense Refill  . Accu-Chek Softclix Lancets lancets SMARTSIG:Topical    . atorvastatin (LIPITOR) 40 MG tablet Take 1 tablet (40 mg total) by mouth daily. 90 tablet 0  . empagliflozin (JARDIANCE) 10 MG TABS tablet Take 1 tablet (10 mg total) by mouth daily before breakfast. 90 tablet 1  . glucose blood (ACCU-CHEK GUIDE) test strip USE TO TEST BLOOD SUGAR TWICE DAILY 200 strip 0  . lisinopril (ZESTRIL) 10 MG tablet Take 1 tablet (10 mg total) by mouth daily. 90 tablet 1  . metFORMIN (GLUCOPHAGE) 500 MG tablet Take 1 tablet (500 mg total) by mouth 2 (two) times daily with a meal. 180 tablet 0  . methylphenidate (METADATE CD) 20 MG CR capsule Take 1 capsule (20 mg total) by mouth daily as needed. 30 capsule 0  . mupirocin ointment (BACTROBAN) 2 % 1 application twice a day to each nostril, each armpit, both groin creases, anal crease, and perineum for 7 days Repeat this process in 1 month 30 g 3  . albuterol (VENTOLIN HFA) 108 (90 Base) MCG/ACT inhaler Inhale 1-2 puffs into the lungs every 4 (four) hours as needed for wheezing or shortness of breath. 48 g 1   No facility-administered medications prior to visit.    Allergies  Allergen Reactions  . Lobster [Shellfish Allergy] Nausea Only  . Poison Ivy Extract Itching    ROS Review of Systems  Constitutional: Negative for appetite change, chills, fatigue and fever.  HENT: Negative for congestion, dental problem, ear pain and sore throat.   Eyes: Negative for discharge, redness and visual disturbance.  Respiratory: Negative for cough, chest tightness, shortness of breath and wheezing.   Cardiovascular: Negative for chest pain, palpitations and leg swelling.  Gastrointestinal: Negative for abdominal pain, blood  in stool, diarrhea, nausea and vomiting.  Genitourinary: Negative for difficulty urinating, dysuria, flank pain, frequency, hematuria and urgency.  Musculoskeletal: Negative for arthralgias, back pain, joint swelling, myalgias and neck stiffness.  Skin: Negative for pallor and rash.  Neurological: Negative for dizziness, speech difficulty, weakness and headaches.  Hematological: Negative for adenopathy. Does not bruise/bleed easily.  Psychiatric/Behavioral: Negative for confusion and sleep disturbance. The patient is not nervous/anxious.  PE; Vitals with BMI 07/11/2020 04/11/2020 04/07/2020  Height 5' 9.5" 5\' 9"  5\' 9"   Weight 259 lbs 6 oz 258 lbs 6 oz 260 lbs  BMI 37.77 98.11 91.47  Systolic 829 562 -  Diastolic 72 82 -  Pulse 95 106 -     Gen: Alert, well appearing.  Patient is oriented to person, place, time, and situation. AFFECT: pleasant, lucid thought and speech. ENT: Ears: EACs clear, normal epithelium.  TMs with good light reflex and landmarks bilaterally.  Eyes: no injection, icteris, swelling, or exudate.  EOMI, PERRLA. Nose: no drainage or turbinate edema/swelling.  No injection or focal lesion.  Mouth: lips without lesion/swelling.  Oral mucosa pink and moist.  Dentition intact and without obvious caries or gingival swelling.  Oropharynx without erythema, exudate, or swelling.  Neck: supple/nontender.  No LAD, mass, or TM.  Carotid pulses 2+ bilaterally, without bruits. CV: RRR, no m/r/g.   LUNGS: CTA bilat, nonlabored resps, good aeration in all lung fields. ABD: soft, NT, ND, BS normal.  No hepatospenomegaly or mass.  No bruits. EXT: no clubbing or cyanosis.  He has 2+ bilat LL pitting edema with some bronzing and spider veins.  No skin breakdown or inflamed varicosities. Musculoskeletal: no joint swelling, erythema, warmth, or tenderness.  ROM of all joints intact. Skin - no sores or suspicious lesions or rashes or color changes   Pertinent labs:  Lab Results   Component Value Date   TSH 2.58 03/10/2019   Lab Results  Component Value Date   WBC 8.1 03/10/2019   HGB 14.6 03/10/2019   HCT 41.7 03/10/2019   MCV 103.7 (H) 03/10/2019   PLT 201 03/10/2019   Lab Results  Component Value Date   CREATININE 0.67 04/11/2020   BUN 17 04/11/2020   NA 134 (L) 04/11/2020   K 4.8 04/11/2020   CL 98 04/11/2020   CO2 26 04/11/2020   Lab Results  Component Value Date   ALT 34 12/31/2019   AST 63 (H) 12/31/2019   ALKPHOS 110 12/31/2019   BILITOT 0.6 12/31/2019   Lab Results  Component Value Date   CHOL 124 12/31/2019   Lab Results  Component Value Date   HDL 41.50 12/31/2019   Lab Results  Component Value Date   LDLCALC 52 12/31/2019   Lab Results  Component Value Date   TRIG 156.0 (H) 12/31/2019   Lab Results  Component Value Date   CHOLHDL 3 12/31/2019   Lab Results  Component Value Date   PSA 0.2 03/10/2019   PSA 0.30 03/03/2018   PSA 0.40 02/26/2017   Lab Results  Component Value Date   HGBA1C 7.2 (H) 04/11/2020   ASSESSMENT AND PLAN:   1) DM: good control per fastings. Feet exam today normal. Hba1c and urine microalb/cr today.  2) HTN: well controlled on lisin 10mg  qd. Lytes/cr today.  3) HLD: tolerating atorva 40mg  qd. LDL goal<70 and it was 52 about 6 mo ago. FLP and hepatic panel today.  4) Health maintenance exam: Reviewed age and gender appropriate health maintenance issues (prudent diet, regular exercise, health risks of tobacco and excessive alcohol, use of seatbelts, fire alarms in home, use of sunscreen).  Also reviewed age and gender appropriate health screening as well as vaccine recommendations. Vaccines: Tdap due->booster today.  Prevnar 20 is indicated->given today.  All other vaccines UTD. Labs: cbc, cmet, flp, psa, Hba1c, urine microalb/cr. Prostate ca screening: PSA today. Colon ca screening: recall 10/2021.  An After Visit Summary was  printed and given to the patient.  FOLLOW UP:  No  follow-ups on file.  Signed:  Crissie Sickles, MD           07/11/2020

## 2020-07-11 NOTE — Addendum Note (Signed)
Addended by: Deveron Furlong D on: 07/11/2020 12:13 PM   Modules accepted: Orders

## 2020-08-11 ENCOUNTER — Other Ambulatory Visit: Payer: Self-pay | Admitting: Family Medicine

## 2020-08-16 ENCOUNTER — Encounter: Payer: Self-pay | Admitting: Family Medicine

## 2020-08-16 NOTE — Telephone Encounter (Signed)
FYI  Please see below

## 2020-08-28 ENCOUNTER — Other Ambulatory Visit: Payer: Self-pay | Admitting: Family Medicine

## 2020-08-28 MED ORDER — METHYLPHENIDATE HCL ER (CD) 20 MG PO CPCR: 20.0000 mg | ORAL_CAPSULE | Freq: Every day | ORAL | 0 refills | Status: DC | PRN

## 2020-08-28 NOTE — Telephone Encounter (Signed)
Requesting: Metadate Contract: 02/23/20 UDS:09/04/18 Last Visit:07/11/20 Next Visit:10/10/20 Last Refill: 07/06/20(30,0)  Please Advise. Medication pending

## 2020-09-29 DIAGNOSIS — U071 COVID-19: Secondary | ICD-10-CM

## 2020-09-29 HISTORY — DX: COVID-19: U07.1

## 2020-10-02 ENCOUNTER — Encounter: Payer: Self-pay | Admitting: Family Medicine

## 2020-10-02 ENCOUNTER — Telehealth (INDEPENDENT_AMBULATORY_CARE_PROVIDER_SITE_OTHER): Payer: 59 | Admitting: Family Medicine

## 2020-10-02 ENCOUNTER — Other Ambulatory Visit: Payer: Self-pay

## 2020-10-02 VITALS — Temp 97.6°F

## 2020-10-02 DIAGNOSIS — U071 COVID-19: Secondary | ICD-10-CM

## 2020-10-02 MED ORDER — NIRMATRELVIR/RITONAVIR (PAXLOVID)TABLET: 3.0000 | ORAL_TABLET | Freq: Two times a day (BID) | ORAL | 0 refills | Status: AC

## 2020-10-02 MED ORDER — METHYLPHENIDATE HCL ER (CD) 20 MG PO CPCR: 20.0000 mg | ORAL_CAPSULE | Freq: Every day | ORAL | 0 refills | Status: DC | PRN

## 2020-10-02 NOTE — Progress Notes (Signed)
Virtual Visit via Video Note  I connected withNAME pt on 10/02/20 at  8:30 AM EDT by a video enabled telemedicine application and verified that I am speaking with the correct person using two identifiers.  Location patient: home, [place] Location provider:work or home office Persons participating in the virtual visit: patient, provider  I discussed the limitations of evaluation and management by telemedicine and the availability of in person appointments. The patient expressed understanding and agreed to proceed.   HPI: 65 y/o WM being seen today for "cough, covid test positive 7/15". Onset of nasal congestion, cough about 4 days ago.  Mild SOB and chest tightness that he relates to his signif cough.  Very tired, slight HA. No fever. Taking mucinex dm.  Wife tested pos a few days prior to him.  ROS: See pertinent positives and negatives per HPI.  Past Medical History:  Diagnosis Date   Adult ADHD    Allergic rhinitis    Anxiety    claustrophobic   Colon polyps 2012 and 2013   All hyperplastic except one adenomatous w/out high grade dysplasia (recall 2023 per Dr. Deatra Ina)   COVID-19 virus infection 09/29/2020   Elevated transaminase level 2017   Viral hep screening neg.  Suspect fatty liver.   Hyperlipidemia    Hypertension    Obesity    OSA on CPAP    Severe.  Wears CPAP but pt does not know his setting.  (Dr. Melvyn Novas follows him)   Seborrheic dermatitis of scalp    and face: hydrocort 2% cream bid prn to face, and fluocinonide 0.5% sol'n to scalp bid prn per Dr. Allyson Sabal (04/2014)   Type 2 diabetes mellitus with microalbuminuria ([facility]) DM dx-09/27/2015. Microalb 2020    Past Surgical History:  Procedure Laterality Date   COLONOSCOPY W/ POLYPECTOMY  04/2010; 10/2011   Initial screening TCS showed 6 polyps (one adenomatous).  Pt says he returned for repeat TCS 10/2011 showed one hyperplastic polyp: Recall 10 yrs per Dr. Deatra Ina   MOUTH SURGERY       Current Outpatient Medications:     Accu-Chek Softclix Lancets lancets, SMARTSIG:Topical, Disp: , Rfl:    atorvastatin (LIPITOR) 40 MG tablet, TAKE 1 TABLET DAILY, Disp: 90 tablet, Rfl: 1   glucose blood (ACCU-CHEK GUIDE) test strip, USE TO TEST BLOOD SUGAR TWICE DAILY, Disp: 200 strip, Rfl: 0   JARDIANCE 10 MG TABS tablet, TAKE 1 TABLET DAILY BEFORE BREAKFAST, Disp: 90 tablet, Rfl: 1   lisinopril (ZESTRIL) 10 MG tablet, Take 1 tablet (10 mg total) by mouth daily., Disp: 90 tablet, Rfl: 1   metFORMIN (GLUCOPHAGE) 500 MG tablet, TAKE 1 TABLET TWICE A DAY WITH MEALS, Disp: 180 tablet, Rfl: 1   methylphenidate (METADATE CD) 20 MG CR capsule, Take 1 capsule (20 mg total) by mouth daily as needed., Disp: 30 capsule, Rfl: 0   mupirocin ointment (BACTROBAN) 2 %, 1 application twice a day to each nostril, each armpit, both groin creases, anal crease, and perineum for 7 days Repeat this process in 1 month, Disp: 30 g, Rfl: 3  EXAM:  VITALS per patient if applicable:  Vitals with BMI 07/11/2020 04/11/2020 04/07/2020  Height 5' 9.5" 5\' 9"  5\' 9"   Weight 259 lbs 6 oz 258 lbs 6 oz 260 lbs  BMI 37.77 55.73 22.02  Systolic 542 706 -  Diastolic 72 82 -  Pulse 95 106 -     GENERAL: alert, oriented, appears well and in no acute distress  HEENT: atraumatic, conjunttiva clear, no  obvious abnormalities on inspection of external nose and ears  NECK: normal movements of the head and neck  LUNGS: on inspection no signs of respiratory distress, breathing rate appears normal, no obvious gross SOB, gasping or wheezing  CV: no obvious cyanosis  MS: moves all visible extremities without noticeable abnormality  PSYCH/NEURO: pleasant and cooperative, no obvious depression or anxiety, speech and thought processing grossly intact  LABS: none today    Chemistry      Component Value Date/Time   NA 135 07/11/2020 1151   K 4.7 07/11/2020 1151   CL 100 07/11/2020 1151   CO2 23 07/11/2020 1151   BUN 19 07/11/2020 1151   CREATININE 0.66 07/11/2020  1151   CREATININE 0.63 (L) 03/10/2019 1436      Component Value Date/Time   CALCIUM 10.2 07/11/2020 1151   ALKPHOS 99 07/11/2020 1151   AST 33 07/11/2020 1151   ALT 25 07/11/2020 1151   BILITOT 0.6 07/11/2020 1151     ASSESSMENT AND PLAN:  Discussed the following assessment and plan:  Covid 19 respiratory infection.  Mild/mod sx's. Pt characteristics that make him high risk for complications from covid infection: age>55 with obesity, DM. Pt's GFR is 100 (07/11/20). Vaccinated with 4 covid injections at this time. Paxlovid eRx'd today.  Cont current symptomatic care. Hold atorvastatin while taking paxlovid. Quarantine precautions discussed.   I discussed the assessment and treatment plan with the patient. The patient was provided an opportunity to ask questions and all were answered. The patient agreed with the plan and demonstrated an understanding of the instructions.   F/U: if not signif improving in 5-6 days.  Signed:  Crissie Sickles, MD           10/02/2020

## 2020-10-10 ENCOUNTER — Ambulatory Visit: Payer: 59 | Admitting: Family Medicine

## 2020-10-17 ENCOUNTER — Ambulatory Visit (INDEPENDENT_AMBULATORY_CARE_PROVIDER_SITE_OTHER): Payer: 59 | Admitting: Family Medicine

## 2020-10-17 ENCOUNTER — Encounter: Payer: Self-pay | Admitting: Family Medicine

## 2020-10-17 ENCOUNTER — Other Ambulatory Visit: Payer: Self-pay

## 2020-10-17 VITALS — BP 126/73 | HR 99 | Temp 98.1°F | Resp 16 | Ht 69.5 in | Wt 255.6 lb

## 2020-10-17 DIAGNOSIS — F988 Other specified behavioral and emotional disorders with onset usually occurring in childhood and adolescence: Secondary | ICD-10-CM | POA: Diagnosis not present

## 2020-10-17 DIAGNOSIS — E118 Type 2 diabetes mellitus with unspecified complications: Secondary | ICD-10-CM

## 2020-10-17 DIAGNOSIS — E1140 Type 2 diabetes mellitus with diabetic neuropathy, unspecified: Secondary | ICD-10-CM | POA: Diagnosis not present

## 2020-10-17 DIAGNOSIS — I10 Essential (primary) hypertension: Secondary | ICD-10-CM | POA: Diagnosis not present

## 2020-10-17 LAB — POCT GLYCOSYLATED HEMOGLOBIN (HGB A1C)
HbA1c POC (<> result, manual entry): 6.6 % (ref 4.0–5.6)
HbA1c, POC (controlled diabetic range): 6.6 % (ref 0.0–7.0)
HbA1c, POC (prediabetic range): 6.6 % — AB (ref 5.7–6.4)
Hemoglobin A1C: 6.6 % — AB (ref 4.0–5.6)

## 2020-10-17 MED ORDER — LISINOPRIL 10 MG PO TABS: 10.0000 mg | ORAL_TABLET | Freq: Every day | ORAL | 3 refills | Status: DC

## 2020-10-17 MED ORDER — METHYLPHENIDATE HCL ER (CD) 20 MG PO CPCR: 20.0000 mg | ORAL_CAPSULE | Freq: Every day | ORAL | 0 refills | Status: DC | PRN

## 2020-10-17 NOTE — Progress Notes (Signed)
OFFICE VISIT  10/17/2020  CC:  Chief Complaint  Patient presents with   Follow-up    RCI, pt is fasting   HPI:    Patient is a 66 y.o. Caucasian male who presents for 3 mo f/u DM 2, HTN, and adult ADD. A/P as of last visit: "1) DM: good control per fastings. Feet exam today normal. Hba1c and urine microalb/cr today.   2) HTN: well controlled on lisin '10mg'$  qd. Lytes/cr today.   3) HLD: tolerating atorva '40mg'$  qd. LDL goal<70 and it was 52 about 6 mo ago. FLP and hepatic panel today.   4) Health maintenance exam: Reviewed age and gender appropriate health maintenance issues (prudent diet, regular exercise, health risks of tobacco and excessive alcohol, use of seatbelts, fire alarms in home, use of sunscreen).  Also reviewed age and gender appropriate health screening as well as vaccine recommendations. Vaccines: Tdap due->booster today.  Prevnar 20 is indicated->given today.  All other vaccines UTD. Labs: cbc, cmet, flp, psa, Hba1c, urine microalb/cr. Prostate ca screening: PSA today. Colon ca screening: recall 10/2021."  INTERIM HX: All labs great last visit, Hba1c 7.1%.  DM: no home glucoses b/c it had been good for so long.  Compliant with jardiance 10 qd and metformin 500 bid.  HTN: no home monitoring b/c it had been stable/normal for so long. Compliant with lisin 10 qd Adult ADD: Pt states all is going well with the med at current dosing: much improved focus, concentration, task completion.  Less frustration, better multitasking, less impulsivity and restlessness.  Mood is stable. No side effects from the medication.   PMP AWARE reviewed today: most recent rx for metadate cd '20mg'$  was filled 10/02/20, # 30, rx by me. No red flags.   Past Medical History:  Diagnosis Date   Adult ADHD    Allergic rhinitis    Anxiety    claustrophobic   Colon polyps 2012 and 2013   All hyperplastic except one adenomatous w/out high grade dysplasia (recall 2023 per Dr. Deatra Ina)    COVID-19 virus infection 09/29/2020   Elevated transaminase level 2017   Viral hep screening neg.  Suspect fatty liver.   Hyperlipidemia    Hypertension    Obesity    OSA on CPAP    Severe.  Wears CPAP but pt does not know his setting.  (Dr. Melvyn Novas follows him)   Seborrheic dermatitis of scalp    and face: hydrocort 2% cream bid prn to face, and fluocinonide 0.5% sol'n to scalp bid prn per Dr. Allyson Sabal (04/2014)   Type 2 diabetes mellitus with microalbuminuria ([facility]) DM dx-09/27/2015. Microalb 2020    Past Surgical History:  Procedure Laterality Date   COLONOSCOPY W/ POLYPECTOMY  04/2010; 10/2011   Initial screening TCS showed 6 polyps (one adenomatous).  Pt says he returned for repeat TCS 10/2011 showed one hyperplastic polyp: Recall 10 yrs per Dr. Deatra Ina   MOUTH SURGERY      Outpatient Medications Prior to Visit  Medication Sig Dispense Refill   Accu-Chek Softclix Lancets lancets SMARTSIG:Topical     atorvastatin (LIPITOR) 40 MG tablet TAKE 1 TABLET DAILY 90 tablet 1   glucose blood (ACCU-CHEK GUIDE) test strip USE TO TEST BLOOD SUGAR TWICE DAILY 200 strip 0   JARDIANCE 10 MG TABS tablet TAKE 1 TABLET DAILY BEFORE BREAKFAST 90 tablet 1   metFORMIN (GLUCOPHAGE) 500 MG tablet TAKE 1 TABLET TWICE A DAY WITH MEALS 180 tablet 1   mupirocin ointment (BACTROBAN) 2 % 1 application twice  a day to each nostril, each armpit, both groin creases, anal crease, and perineum for 7 days Repeat this process in 1 month 30 g 3   lisinopril (ZESTRIL) 10 MG tablet Take 1 tablet (10 mg total) by mouth daily. 90 tablet 1   methylphenidate (METADATE CD) 20 MG CR capsule Take 1 capsule (20 mg total) by mouth daily as needed. 30 capsule 0   No facility-administered medications prior to visit.    Allergies  Allergen Reactions   Lobster [Shellfish Allergy] Nausea Only   Poison Ivy Extract Itching    ROS As per HPI  PE: Vitals with BMI 10/17/2020 07/11/2020 04/11/2020  Height 5' 9.5" 5' 9.5" '5\' 9"'$   Weight 255  lbs 10 oz 259 lbs 6 oz 258 lbs 6 oz  BMI 37.22 [id number]  Systolic 123XX123 123456 123456  Diastolic 73 72 82  Pulse 99 95 106   Gen: Alert, well appearing.  Patient is oriented to person, place, time, and situation. AFFECT: pleasant, lucid thought and speech. No further exam today.  LABS:  Lab Results  Component Value Date   TSH 2.58 03/10/2019   Lab Results  Component Value Date   WBC 7.0 07/11/2020   HGB 14.5 07/11/2020   HCT 42.8 07/11/2020   MCV 105.8 (H) 07/11/2020   PLT 194.0 07/11/2020   Lab Results  Component Value Date   CREATININE 0.66 07/11/2020   BUN 19 07/11/2020   NA 135 07/11/2020   K 4.7 07/11/2020   CL 100 07/11/2020   CO2 23 07/11/2020   Lab Results  Component Value Date   ALT 25 07/11/2020   AST 33 07/11/2020   ALKPHOS 99 07/11/2020   BILITOT 0.6 07/11/2020   Lab Results  Component Value Date   CHOL 122 07/11/2020   Lab Results  Component Value Date   HDL 55.40 07/11/2020   Lab Results  Component Value Date   LDLCALC 48 07/11/2020   Lab Results  Component Value Date   TRIG 92.0 07/11/2020   Lab Results  Component Value Date   CHOLHDL 2 07/11/2020   Lab Results  Component Value Date   PSA 0.25 07/11/2020   PSA 0.2 03/10/2019   PSA 0.30 03/03/2018   Lab Results  Component Value Date   HGBA1C 6.6 (A) 10/17/2020   HGBA1C 6.6 10/17/2020   HGBA1C 6.6 (A) 10/17/2020   HGBA1C 6.6 10/17/2020   POC Hba1c today->6.6%.  IMPRESSION AND PLAN:  1) DM 2, well controlled. POC Hba1c today 6.6%. Cont jardiance 10 qd and metformin 500 bid. Lytes/cr repeatedly normal, most recent 3 mo ago. Plan rpt next f/u in 3 mo.  2) HLD: tolerating atorvastatin 40 mg qd. LDL was 48 three mo ago, hepatic panel normal. Plan rpt FLP and hepatic panel 3 mo.  3) HTN: well controlled on lisinopril '10mg'$  qd. Lytes/cr repeatedly normal, most recent 3 mo ago. Plan rpt next f/u in 25mo  4) Adult ADD: stable on metadate CD '20mg'$  qd. I did electronic rx's for this  med today for each of the next 3 months.  Appropriate fill on/after date was noted on each rx.  An After Visit Summary was printed and given to the patient.  FOLLOW UP: Return in about 3 months (around 01/17/2021) for routine chronic illness f/u. Next cpe 06/2021  Signed:  PCrissie Sickles MD           10/17/2020

## 2020-10-20 ENCOUNTER — Telehealth: Payer: Self-pay | Admitting: Family Medicine

## 2020-10-20 MED ORDER — PREDNISONE 20 MG PO TABS: ORAL_TABLET | ORAL | 0 refills | Status: DC

## 2020-10-20 MED ORDER — BENZONATATE 200 MG PO CAPS
200.0000 mg | ORAL_CAPSULE | Freq: Three times a day (TID) | ORAL | 1 refills | Status: DC | PRN
Start: 2020-10-20 — End: 2021-04-25

## 2020-10-20 NOTE — Telephone Encounter (Signed)
LM for pt to returncall

## 2020-10-20 NOTE — Telephone Encounter (Signed)
Patient returned call and I relayed Dr. Idelle Leech message as follows: Prednisone and tessalon pearls eRx'd. Please call patient back if other information needed.

## 2020-10-20 NOTE — Telephone Encounter (Signed)
Noted. Nothing further needed. 

## 2020-10-20 NOTE — Telephone Encounter (Signed)
Prednisone and tessalon pearls eRx'd

## 2020-10-20 NOTE — Telephone Encounter (Signed)
Pt was last seen 07/18 for covid, rx given for paxlovid.    Please Advise

## 2020-10-20 NOTE — Telephone Encounter (Signed)
Caller Name: pt Call back phone #: [phone]  Reason for Call: Pt had OV 8/2 and said he is post-covid and still has a cough. He's been using albuterol inhaler and mucinex dm. Pt is not getting relief and coughing thru the night. Pt asking for a prescription to help reduce the cough.   Please call back.   [id number] [place]:  [phone] Fax:  [phone]

## 2020-11-16 ENCOUNTER — Other Ambulatory Visit: Payer: Self-pay | Admitting: Family Medicine

## 2021-01-25 ENCOUNTER — Ambulatory Visit: Payer: 59 | Admitting: Family Medicine

## 2021-01-26 ENCOUNTER — Other Ambulatory Visit: Payer: Self-pay | Admitting: Family Medicine

## 2021-01-26 ENCOUNTER — Other Ambulatory Visit: Payer: Self-pay

## 2021-01-26 ENCOUNTER — Ambulatory Visit: Payer: 59 | Admitting: Family Medicine

## 2021-01-26 ENCOUNTER — Encounter: Payer: Self-pay | Admitting: Family Medicine

## 2021-01-26 VITALS — BP 126/81 | HR 103 | Temp 98.7°F | Ht 69.5 in | Wt 249.8 lb

## 2021-01-26 DIAGNOSIS — E1129 Type 2 diabetes mellitus with other diabetic kidney complication: Secondary | ICD-10-CM

## 2021-01-26 DIAGNOSIS — F988 Other specified behavioral and emotional disorders with onset usually occurring in childhood and adolescence: Secondary | ICD-10-CM

## 2021-01-26 DIAGNOSIS — Z23 Encounter for immunization: Secondary | ICD-10-CM

## 2021-01-26 DIAGNOSIS — R052 Subacute cough: Secondary | ICD-10-CM

## 2021-01-26 DIAGNOSIS — I10 Essential (primary) hypertension: Secondary | ICD-10-CM

## 2021-01-26 DIAGNOSIS — R809 Proteinuria, unspecified: Secondary | ICD-10-CM

## 2021-01-26 DIAGNOSIS — E7800 Pure hypercholesterolemia, unspecified: Secondary | ICD-10-CM

## 2021-01-26 DIAGNOSIS — Z79899 Other long term (current) drug therapy: Secondary | ICD-10-CM

## 2021-01-26 LAB — LIPID PANEL
Cholesterol: 118 mg/dL (ref 0–200)
HDL: 53.3 mg/dL (ref >39.00)
LDL Cholesterol: 45 mg/dL (ref 0–99)
NonHDL: 65.14
Total CHOL/HDL Ratio: 2
Triglycerides: 101 mg/dL (ref 0.0–149.0)
VLDL: 20.2 mg/dL (ref 0.0–40.0)

## 2021-01-26 LAB — COMPREHENSIVE METABOLIC PANEL
ALT: 27 U/L (ref 0–53)
AST: 38 U/L — ABNORMAL HIGH (ref 0–37)
Albumin: 4.6 g/dL (ref 3.5–5.2)
Alkaline Phosphatase: 95 U/L (ref 39–117)
BUN: 16 mg/dL (ref 6–23)
CO2: 23 mEq/L (ref 19–32)
Calcium: 9.6 mg/dL (ref 8.4–10.5)
Chloride: 99 mEq/L (ref 96–112)
Creatinine, Ser: 0.65 mg/dL (ref 0.40–1.50)
GFR: 99.36 mL/min (ref >60.00)
Glucose, Bld: 97 mg/dL (ref 70–99)
Potassium: 4.3 mEq/L (ref 3.5–5.1)
Sodium: 135 mEq/L (ref 135–145)
Total Bilirubin: 0.6 mg/dL (ref 0.2–1.2)
Total Protein: 7.4 g/dL (ref 6.0–8.3)

## 2021-01-26 LAB — HEMOGLOBIN A1C: Hgb A1c MFr Bld: 6.5 % (ref 4.6–6.5)

## 2021-01-26 MED ORDER — METHYLPHENIDATE HCL ER (CD) 20 MG PO CPCR: 20.0000 mg | ORAL_CAPSULE | Freq: Every day | ORAL | 0 refills | Status: DC | PRN

## 2021-01-26 MED ORDER — PREDNISONE 20 MG PO TABS: ORAL_TABLET | ORAL | 0 refills | Status: DC

## 2021-01-26 NOTE — Progress Notes (Signed)
OFFICE VISIT  01/26/2021  CC:  Chief Complaint  Patient presents with   Follow-up    RCI; pt is fasting    HPI:    Patient is a 65 y.o. male who presents for 3 mo f/u DM 2, HTN, HLD, and adult ADD. A/P as of last visit: "1) DM 2, well controlled. POC Hba1c today 6.6%. Cont jardiance 10 qd and metformin 500 bid. Lytes/cr repeatedly normal, most recent 3 mo ago. Plan rpt next f/u in 3 mo.   2) HLD: tolerating atorvastatin 40 mg qd. LDL was 48 three mo ago, hepatic panel normal. Plan rpt FLP and hepatic panel 3 mo.   3) HTN: well controlled on lisinopril 10mg  qd. Lytes/cr repeatedly normal, most recent 3 mo ago. Plan rpt next f/u in 15mo.   4) Adult ADD: stable on metadate CD 20mg  qd. I did electronic rx's for this med today for each of the next 3 months.  Appropriate fill on/after date was noted on each rx."  INTERIM HX: Richard [name] feeling well other than being pretty stressed out for the last 6 to 8 months.  Lots of changes with his son (divorced and then remarried this year and has baby on the way) and is trying to get things arranged for his mom and uncle to get into an assisted living facility.  Richard [name] continues to work full-time. No home blood pressure or glucose monitoring.  Richard [name] is compliant with all his meds.  Pt states all is going well with the med at current dosing: much improved focus, concentration, task completion.  Less frustration, better multitasking, less impulsivity and restlessness.  Mood is stable. No side effects from the medication.  PMP AWARE reviewed today: most recent rx for methylphenidate cd 20 was filled 12/23/20, # 30, rx by me. No red flags.  Richard [name] continues to have cough (since having covid 4 mo ago) that mostly bothers him at night and then in the morning.  Says it is kind of rattly.  No wheezing, chest tightness, chest pain, or shortness of breath.  No malaise and no fevers.  Richard [name] takes Mucinex and uses his albuterol inhaler and this seems to help  well.  ROS as above, plus--> no fevers, no CP, no SOB, no wheezing, no cough, no dizziness, no HAs, no rashes, no melena/hematochezia.  No polyuria or polydipsia.  No myalgias or arthralgias.  No focal weakness, paresthesias, or tremors.  No acute vision or hearing abnormalities.  No dysuria or unusual/new urinary urgency or frequency.  No recent changes in lower legs. No n/v/d or abd pain.  No palpitations.    Past Medical History:  Diagnosis Date   Adult ADHD    Allergic rhinitis    Anxiety    claustrophobic   Colon polyps 2012 and 2013   All hyperplastic except one adenomatous w/out high grade dysplasia (recall 2023 per Dr. Deatra Ina)   COVID-19 virus infection 09/29/2020   Elevated transaminase level 2017   Viral hep screening neg.  Suspect fatty liver.   Hyperlipidemia    Hypertension    Obesity    OSA on CPAP    Severe.  Wears CPAP but pt does not know his setting.  (Dr. Melvyn Novas follows him)   Seborrheic dermatitis of scalp    and face: hydrocort 2% cream bid prn to face, and fluocinonide 0.5% sol'n to scalp bid prn per Dr. Allyson Sabal (04/2014)   Type 2 diabetes mellitus with microalbuminuria ([place]) DM dx-09/27/2015. Microalb 2020    Past Surgical  History:  Procedure Laterality Date   COLONOSCOPY W/ POLYPECTOMY  04/2010; 10/2011   Initial screening TCS showed 6 polyps (one adenomatous).  Pt says Richard [name] returned for repeat TCS 10/2011 showed one hyperplastic polyp: Recall 10 yrs per Dr. Deatra Ina   MOUTH SURGERY      Outpatient Medications Prior to Visit  Medication Sig Dispense Refill   Accu-Chek Softclix Lancets lancets SMARTSIG:Topical     benzonatate (TESSALON) 200 MG capsule Take 1 capsule (200 mg total) by mouth 3 (three) times daily as needed for cough. 20 capsule 1   glucose blood (ACCU-CHEK GUIDE) test strip USE TO TEST BLOOD SUGAR TWICE DAILY 200 strip 0   JARDIANCE 10 MG TABS tablet TAKE 1 TABLET DAILY BEFORE BREAKFAST 90 tablet 1   lisinopril (ZESTRIL) 10 MG tablet Take 1 tablet (10  mg total) by mouth daily. 90 tablet 3   mupirocin ointment (BACTROBAN) 2 % 1 application twice a day to each nostril, each armpit, both groin creases, anal crease, and perineum for 7 days Repeat this process in 1 month 30 g 3   atorvastatin (LIPITOR) 40 MG tablet TAKE 1 TABLET DAILY 90 tablet 1   metFORMIN (GLUCOPHAGE) 500 MG tablet TAKE 1 TABLET TWICE A DAY WITH MEALS 180 tablet 1   methylphenidate (METADATE CD) 20 MG CR capsule Take 1 capsule (20 mg total) by mouth daily as needed. 30 capsule 0   predniSONE (DELTASONE) 20 MG tablet 2 tabs po qd x 5d (Patient not taking: Reported on 01/26/2021) 10 tablet 0   No facility-administered medications prior to visit.    Allergies  Allergen Reactions   Lobster [Shellfish Allergy] Nausea Only   Poison Ivy Extract Itching    PE: Vitals with BMI 01/26/2021 10/17/2020 07/11/2020  Height 5' 9.5" 5' 9.5" 5' 9.5"  Weight 249 lbs 13 oz 255 lbs 10 oz 259 lbs 6 oz  BMI 36.37 71.24 58.09  Systolic 983 382 505  Diastolic 81 73 72  Pulse 397 99 95   Wt Readings from Last 2 Encounters:  01/26/21 249 lb 12.8 oz (113.3 kg)  10/17/20 255 lb 9.6 oz (115.9 kg)    Gen: alert, oriented x 4, affect pleasant.  Lucid thinking and conversation noted. HEENT: PERRLA, EOMI.   Neck: no LAD, mass, or thyromegaly. CV: RRR, no m/r/g LUNGS: CTA bilat, nonlabored. NEURO: no tremor or tics noted on observation.  Coordination intact. CN 2-12 grossly intact bilaterally, strength 5/5 in all extremeties.  No ataxia.   LABS:    Chemistry      Component Value Date/Time   NA 135 07/11/2020 1151   K 4.7 07/11/2020 1151   CL 100 07/11/2020 1151   CO2 23 07/11/2020 1151   BUN 19 07/11/2020 1151   CREATININE 0.66 07/11/2020 1151   CREATININE 0.63 (L) 03/10/2019 1436      Component Value Date/Time   CALCIUM 10.2 07/11/2020 1151   ALKPHOS 99 07/11/2020 1151   AST 33 07/11/2020 1151   ALT 25 07/11/2020 1151   BILITOT 0.6 07/11/2020 1151     Lab Results  Component  Value Date   HGBA1C 6.6 (A) 10/17/2020   HGBA1C 6.6 10/17/2020   HGBA1C 6.6 (A) 10/17/2020   HGBA1C 6.6 10/17/2020   Lab Results  Component Value Date   CHOL 122 07/11/2020   HDL 55.40 07/11/2020   LDLCALC 48 07/11/2020   LDLDIRECT 139.9 06/13/2011   TRIG 92.0 07/11/2020   CHOLHDL 2 07/11/2020    IMPRESSION AND PLAN:  #  1 diabetes, well controlled.  Continue Jardiance 10 mg a day and metformin 500 twice a day.  Checking hemoglobin A1c and fasting glucose today.  2.  Hypertension.  Well-controlled on lisinopril 10 mg a day. Electrolytes and creatinine checked today.  3.  Hyperlipidemia.  Last LDL 6 months ago was 48.  Tolerating atorvastatin 40 mg a day.  Lipid panel and hepatic panel check today.  #4.  Persistent cough--for the last 4 months since having COVID.  I think this is a postviral pneumonitis.  We will do trial of prednisone 40 mg a day for 5 days.  Continue albuterol inhaler and Mucinex as needed.  5. adult ADD.  Richard [name] is doing well on Metadate CD20 milligrams a day long-term. CSC UTD. UDS today.  An After Visit Summary was printed and given to the patient.  FOLLOW UP: Return in about 3 months (around 04/28/2021) for routine chronic illness f/u. Next cpe 06/2021  Signed:  Crissie Sickles, MD           01/26/2021

## 2021-01-26 NOTE — Telephone Encounter (Signed)
Pt has pending appt today with provider. Will verify pharmacy for refills

## 2021-01-28 ENCOUNTER — Other Ambulatory Visit: Payer: Self-pay | Admitting: Family Medicine

## 2021-01-28 LAB — DRUG MONITORING PANEL 376104, URINE
Amphetamines: NEGATIVE ng/mL (ref <500)
Barbiturates: NEGATIVE ng/mL (ref <300)
Benzodiazepines: NEGATIVE ng/mL (ref <100)
Cocaine Metabolite: NEGATIVE ng/mL (ref <150)
Desmethyltramadol: NEGATIVE ng/mL (ref <100)
Opiates: NEGATIVE ng/mL (ref <100)
Oxycodone: NEGATIVE ng/mL (ref <100)
Tramadol: NEGATIVE ng/mL (ref <100)

## 2021-01-28 LAB — DM TEMPLATE

## 2021-03-27 ENCOUNTER — Other Ambulatory Visit: Payer: Self-pay

## 2021-03-27 ENCOUNTER — Encounter: Payer: Self-pay | Admitting: Pulmonary Disease

## 2021-03-27 ENCOUNTER — Ambulatory Visit (INDEPENDENT_AMBULATORY_CARE_PROVIDER_SITE_OTHER): Payer: 59 | Admitting: Pulmonary Disease

## 2021-03-27 VITALS — BP 128/80 | HR 99 | Temp 99.1°F | Ht 69.5 in | Wt 253.0 lb

## 2021-03-27 DIAGNOSIS — Z9989 Dependence on other enabling machines and devices: Secondary | ICD-10-CM

## 2021-03-27 DIAGNOSIS — G4733 Obstructive sleep apnea (adult) (pediatric): Secondary | ICD-10-CM

## 2021-03-27 NOTE — Progress Notes (Signed)
Richard [name]    [id number]    Aug 29, 1955  Primary Care Physician:McGowen, Adrian Blackwater, MD  Referring Physician: [name], MD [id number] [place],  [place] [id number]  Chief complaint:   History of obstructive sleep apnea  HPI:  Uses CPAP nightly Benefit from CPAP nightly  Other health problems are well controlled including diabetes, hypercholesterolemia, hypertension  Usually goes to bed between 830 and 11:30 PM Wakes up about 9 AM  Falls asleep in minutes 1-2 awakenings Weight has been relatively stable  Last sleep study was 2011 at [facility]  He has a machine that he has at home and a travel machine His travel machine recently quit working he is compliant with CPAP and continues to benefit from it on a regular basis   Outpatient Encounter Medications as of 03/27/2021  Medication Sig   Accu-Chek Softclix Lancets lancets SMARTSIG:Topical   albuterol (VENTOLIN HFA) 108 (90 Base) MCG/ACT inhaler Inhale into the lungs.   atorvastatin (LIPITOR) 40 MG tablet TAKE 1 TABLET DAILY   benzonatate (TESSALON) 200 MG capsule Take 1 capsule (200 mg total) by mouth 3 (three) times daily as needed for cough.   glucose blood (ACCU-CHEK GUIDE) test strip USE TO TEST BLOOD SUGAR TWICE DAILY   JARDIANCE 10 MG TABS tablet TAKE 1 TABLET DAILY BEFORE BREAKFAST   lisinopril (ZESTRIL) 10 MG tablet Take 1 tablet (10 mg total) by mouth daily.   metFORMIN (GLUCOPHAGE) 500 MG tablet TAKE 1 TABLET TWICE A DAY WITH MEALS   methylphenidate (METADATE CD) 20 MG CR capsule Take 1 capsule (20 mg total) by mouth daily as needed.   mupirocin ointment (BACTROBAN) 2 % 1 application twice a day to each nostril, each armpit, both groin creases, anal crease, and perineum for 7 days Repeat this process in 1 month   [DISCONTINUED] predniSONE (DELTASONE) 20 MG tablet 2 tabs po qd x 5d (Patient not taking: Reported on 03/27/2021)   No facility-administered encounter medications on file as of  03/27/2021.    Allergies as of 03/27/2021 - Review Complete 03/27/2021  Allergen Reaction Noted   Lobster [shellfish allergy] Nausea Only 09/04/2018   Poison ivy extract Itching 09/04/2018    Past Medical History:  Diagnosis Date   Adult ADHD    Allergic rhinitis    Anxiety    claustrophobic   Colon polyps 2012 and 2013   All hyperplastic except one adenomatous w/out high grade dysplasia (recall 2023 per Dr. Deatra Ina)   COVID-19 virus infection 09/29/2020   Elevated transaminase level 2017   Viral hep screening neg.  Suspect fatty liver.   Hyperlipidemia    Hypertension    Obesity    OSA on CPAP    Severe.  Wears CPAP but pt does not know his setting.  (Dr. Melvyn Novas follows him)   Seborrheic dermatitis of scalp    and face: hydrocort 2% cream bid prn to face, and fluocinonide 0.5% sol'n to scalp bid prn per Dr. Allyson Sabal (04/2014)   Type 2 diabetes mellitus with microalbuminuria ([place]) DM dx-09/27/2015. Microalb 2020    Past Surgical History:  Procedure Laterality Date   COLONOSCOPY W/ POLYPECTOMY  04/2010; 10/2011   Initial screening TCS showed 6 polyps (one adenomatous).  Pt says he returned for repeat TCS 10/2011 showed one hyperplastic polyp: Recall 10 yrs per Dr. Deatra Ina   MOUTH SURGERY      Family History  Problem Relation Age of Onset  Lung cancer Father    Cancer Father        nasal   Allergies Father    Colon cancer Neg Hx    Esophageal cancer Neg Hx    Rectal cancer Neg Hx    Stomach cancer Neg Hx     Social History   Socioeconomic History   Marital status: Married    Spouse name: Not on file   Number of children: Not on file   Years of education: Not on file   Highest education level: Not on file  Occupational History   Occupation: Counselling psychologist  Tobacco Use   Smoking status: Former    Packs/day: 1.00    Types: Cigarettes    Quit date: 07/17/2002    Years since quitting: 18.7   Smokeless tobacco: Never  Vaping Use   Vaping Use: Never used   Substance and Sexual Activity   Alcohol use: Yes    Alcohol/week: 7.0 standard drinks    Types: 7 Standard drinks or equivalent per week    Comment: wine every night with dinner   Drug use: No   Sexual activity: Not on file  Other Topics Concern   Not on file  Social History Narrative   Married, 1 child--grown.   Occupation: Financial planner"   Tobacco: 20 pack-yr hx; quit 2004.   Alcohol: nightly cocktail   Drugs: none   Exercise: "walk some when I can".   Diet: normal.   Right Handed. Used to be left handed   Three story home   Drinks a lot of tea and occasional coffee   Social Determinants of Radio broadcast assistant Strain: Not on file  Food Insecurity: Not on file  Transportation Needs: Not on file  Physical Activity: Not on file  Stress: Not on file  Social Connections: Not on file  Intimate Partner Violence: Not on file    Review of Systems  Constitutional:  Negative for fatigue.  Psychiatric/Behavioral:  Positive for sleep disturbance.    There were no vitals filed for this visit.   Physical Exam Constitutional:      Appearance: He is obese.  HENT:     Head: Normocephalic.     Nose: Nose normal.     Mouth/Throat:     Mouth: Mucous membranes are moist.     Comments: Crowded oropharynx, Mallampati 3 Eyes:     Pupils: Pupils are equal, round, and reactive to light.  Cardiovascular:     Rate and Rhythm: Normal rate and regular rhythm.     Heart sounds: No murmur heard.   No friction rub.  Pulmonary:     Effort: No respiratory distress.     Breath sounds: No stridor. No wheezing or rhonchi.  Musculoskeletal:     Cervical back: No rigidity or tenderness.  Neurological:     Mental Status: He is alert.  Psychiatric:        Mood and Affect: Mood normal.     Data Reviewed: Previous study reveals severe obstructive sleep apnea  Assessment:  Severe obstructive sleep apnea  Compliant with CPAP use  Continues to benefit from CPAP  Machine recently  quit working, will contact medical supply company  Plan/Recommendations: Contact DME company for CPAP settings and CPAP prescription  Encouraged to continue using CPAP  He has been self monitoring for many years and continues to benefit from it  I will see him as needed  Encouraged to give [place] a call if he has any significant concerns  Sherrilyn Rist MD [facility] 03/27/2021, 3:47 PM  CC: McGowen, Adrian Blackwater, MD

## 2021-03-27 NOTE — Addendum Note (Signed)
Addended by: Dessie Coma on: 03/27/2021 04:16 PM   Modules accepted: Orders

## 2021-03-27 NOTE — Patient Instructions (Signed)
We will contact your [facility] to ascertain what is needed  Prescription for a Z2 travel machine  Continue using your CPAP on a regular basis  Weight loss efforts  Call us with any significant concerns  Will be glad to see you as needed

## 2021-04-03 ENCOUNTER — Other Ambulatory Visit: Payer: Self-pay | Admitting: Family Medicine

## 2021-04-04 MED ORDER — METHYLPHENIDATE HCL ER (CD) 20 MG PO CPCR: 20.0000 mg | ORAL_CAPSULE | Freq: Every day | ORAL | 0 refills | Status: DC | PRN

## 2021-04-04 NOTE — Telephone Encounter (Signed)
Requesting: methylphenidate 20mg  Contract:01/26/21 UDS:01/26/21 Last Visit: 01/26/21 Next Visit: 04/25/21 Last Refill: 01/26/21(30,0)  Please Advise. Med pending

## 2021-04-18 DIAGNOSIS — E291 Testicular hypofunction: Secondary | ICD-10-CM

## 2021-04-18 HISTORY — DX: Testicular hypofunction: E29.1

## 2021-04-25 ENCOUNTER — Other Ambulatory Visit: Payer: Self-pay

## 2021-04-25 ENCOUNTER — Ambulatory Visit: Payer: 59 | Admitting: Family Medicine

## 2021-04-25 ENCOUNTER — Encounter: Payer: Self-pay | Admitting: Family Medicine

## 2021-04-25 VITALS — BP 130/74 | HR 78 | Temp 98.2°F | Ht 69.5 in | Wt 251.0 lb

## 2021-04-25 DIAGNOSIS — R6882 Decreased libido: Secondary | ICD-10-CM

## 2021-04-25 DIAGNOSIS — F988 Other specified behavioral and emotional disorders with onset usually occurring in childhood and adolescence: Secondary | ICD-10-CM

## 2021-04-25 DIAGNOSIS — I10 Essential (primary) hypertension: Secondary | ICD-10-CM | POA: Diagnosis not present

## 2021-04-25 DIAGNOSIS — E119 Type 2 diabetes mellitus without complications: Secondary | ICD-10-CM | POA: Diagnosis not present

## 2021-04-25 LAB — BASIC METABOLIC PANEL
BUN: 14 mg/dL (ref 6–23)
CO2: 27 mEq/L (ref 19–32)
Calcium: 9.8 mg/dL (ref 8.4–10.5)
Chloride: 102 mEq/L (ref 96–112)
Creatinine, Ser: 0.69 mg/dL (ref 0.40–1.50)
GFR: 97.42 mL/min (ref >60.00)
Glucose, Bld: 108 mg/dL — ABNORMAL HIGH (ref 70–99)
Potassium: 4.7 mEq/L (ref 3.5–5.1)
Sodium: 138 mEq/L (ref 135–145)

## 2021-04-25 LAB — HEMOGLOBIN A1C: Hgb A1c MFr Bld: 6.9 % — ABNORMAL HIGH (ref 4.6–6.5)

## 2021-04-25 LAB — LUTEINIZING HORMONE: LH: 6.97 m[IU]/mL (ref 1.50–9.30)

## 2021-04-25 MED ORDER — METHYLPHENIDATE HCL ER (CD) 20 MG PO CPCR: 20.0000 mg | ORAL_CAPSULE | Freq: Every day | ORAL | 0 refills | Status: DC | PRN

## 2021-04-25 NOTE — Progress Notes (Signed)
OFFICE VISIT  04/25/2021  CC:  Chief Complaint  Patient presents with   Follow-up    RCI; pt is fasting   HPI:    Patient is a 66 y.o. male who presents for 3 mo f/u adult ADD, HTN, and DM2. A/P as of last visit: "#1 diabetes, well controlled.  Continue Jardiance 10 mg a day and metformin 500 twice a day.  Checking hemoglobin A1c and fasting glucose today.  2.  Hypertension.  Well-controlled on lisinopril 10 mg a day. Electrolytes and creatinine checked today.  3.  Hyperlipidemia.  Last LDL 6 months ago was 48.  Tolerating atorvastatin 40 mg a day.  Lipid panel and hepatic panel check today.   #4.  Persistent cough--for the last 4 months since having COVID.  I think this is a postviral pneumonitis.  We will do trial of prednisone 40 mg a day for 5 days.  Continue albuterol inhaler and Mucinex as needed.   5. adult ADD.  He is doing well on Metadate CD20 milligrams a day long-term. CSC UTD. UDS today."  INTERIM HX: Feeling well.  The only thing he has complaint of his a pretty rapid loss of his libido and energy over the last year.  He has erectile dysfunction as well.  He is not doing any home blood pressure or diabetic monitoring because everything had been stable for long time.  Pt states all is going well with the med at current dosing: much improved focus, concentration, task completion.  Less frustration, better multitasking, less impulsivity and restlessness.  Mood is stable. No side effects from the medication.  PMP AWARE reviewed today: most recent rx for methylphenidate cd was filled 04/04/21, # 30, rx by me. No red flags.  Past Medical History:  Diagnosis Date   Adult ADHD    Allergic rhinitis    Anxiety    claustrophobic   Colon polyps 2012 and 2013   All hyperplastic except one adenomatous w/out high grade dysplasia (recall 2023 per Dr. Deatra Ina)   COVID-19 virus infection 09/29/2020   Elevated transaminase level 2017   Viral hep screening neg.  Suspect fatty  liver.   Hyperlipidemia    Hypertension    Obesity    OSA on CPAP    Severe.  Wears CPAP but pt does not know his setting.  (Dr. Melvyn Novas follows him)   Seborrheic dermatitis of scalp    and face: hydrocort 2% cream bid prn to face, and fluocinonide 0.5% sol'n to scalp bid prn per Dr. Allyson Sabal (04/2014)   Type 2 diabetes mellitus with microalbuminuria ([place]) DM dx-09/27/2015. Microalb 2020    Past Surgical History:  Procedure Laterality Date   COLONOSCOPY W/ POLYPECTOMY  04/2010; 10/2011   Initial screening TCS showed 6 polyps (one adenomatous).  Pt says he returned for repeat TCS 10/2011 showed one hyperplastic polyp: Recall 10 yrs per Dr. Deatra Ina   MOUTH SURGERY      Outpatient Medications Prior to Visit  Medication Sig Dispense Refill   Accu-Chek Softclix Lancets lancets SMARTSIG:Topical     albuterol (VENTOLIN HFA) 108 (90 Base) MCG/ACT inhaler Inhale into the lungs.     atorvastatin (LIPITOR) 40 MG tablet TAKE 1 TABLET DAILY 90 tablet 1   glucose blood (ACCU-CHEK GUIDE) test strip USE TO TEST BLOOD SUGAR TWICE DAILY 200 strip 0   JARDIANCE 10 MG TABS tablet TAKE 1 TABLET DAILY BEFORE BREAKFAST 90 tablet 1   lisinopril (ZESTRIL) 10 MG tablet Take 1 tablet (10 mg total) by  mouth daily. 90 tablet 3   metFORMIN (GLUCOPHAGE) 500 MG tablet TAKE 1 TABLET TWICE A DAY WITH MEALS 180 tablet 1   mupirocin ointment (BACTROBAN) 2 % 1 application twice a day to each nostril, each armpit, both groin creases, anal crease, and perineum for 7 days Repeat this process in 1 month 30 g 3   methylphenidate (METADATE CD) 20 MG CR capsule Take 1 capsule (20 mg total) by mouth daily as needed. 30 capsule 0   benzonatate (TESSALON) 200 MG capsule Take 1 capsule (200 mg total) by mouth 3 (three) times daily as needed for cough. (Patient not taking: Reported on 04/25/2021) 20 capsule 1   No facility-administered medications prior to visit.    Allergies  Allergen Reactions   Lobster [Shellfish Allergy] Nausea Only    Poison Ivy Extract Itching    ROS As per HPI  PE: Vitals with BMI 04/25/2021 03/27/2021 01/26/2021  Height 5' 9.5" 5' 9.5" 5' 9.5"  Weight 251 lbs 253 lbs 249 lbs 13 oz  BMI 36.55 77.93 90.30  Systolic 092 330 076  Diastolic 74 80 81  Pulse 78 99 103    Physical Exam  Gen: Alert, well appearing.  Patient is oriented to person, place, time, and situation. AFFECT: pleasant, lucid thought and speech. No further exam today.  LABS:  Last CBC Lab Results  Component Value Date   WBC 7.0 07/11/2020   HGB 14.5 07/11/2020   HCT 42.8 07/11/2020   MCV 105.8 (H) 07/11/2020   MCH 36.3 (H) 03/10/2019   RDW 14.3 07/11/2020   PLT 194.0 22/63/3354   Last metabolic panel Lab Results  Component Value Date   GLUCOSE 97 01/26/2021   NA 135 01/26/2021   K 4.3 01/26/2021   CL 99 01/26/2021   CO2 23 01/26/2021   BUN 16 01/26/2021   CREATININE 0.65 01/26/2021   CALCIUM 9.6 01/26/2021   PROT 7.4 01/26/2021   ALBUMIN 4.6 01/26/2021   BILITOT 0.6 01/26/2021   ALKPHOS 95 01/26/2021   AST 38 (H) 01/26/2021   ALT 27 01/26/2021   Last lipids Lab Results  Component Value Date   CHOL 118 01/26/2021   HDL 53.30 01/26/2021   LDLCALC 45 01/26/2021   LDLDIRECT 139.9 06/13/2011   TRIG 101.0 01/26/2021   CHOLHDL 2 01/26/2021   Last hemoglobin A1c Lab Results  Component Value Date   HGBA1C 6.5 01/26/2021   Last thyroid functions Lab Results  Component Value Date   TSH 2.58 03/10/2019   IMPRESSION AND PLAN:  #1 loss of libido.  Erectile dysfunction accompanying this. Ordered testosterone level, prolactin, and LH today.  2.  Diabetes type 2.  Doing well on Glucophage 500 mg twice a day and Jardiance 10 mg a day.  Hemoglobin A1c and electrolytes/creatinine today.  3.  Hypertension, well controlled on lisinopril 10 mg a day. Electrolytes and creatinine today.  #4 hyperlipidemia.  Lipid panel excellent 3 months ago.  Tolerating atorvastatin 40 mg a day well. Recheck lipids and  hepatic panel at next follow-up in 3 months.  5.  Adult ADD.  Stable. Controlled substance contract up-to-date. Methylphenidate CD20 MG, 1 cap daily, prescription for each of the next 3 months sent in today  An After Visit Summary was printed and given to the patient.  FOLLOW UP: Return for annual CPE (fasting). Next cpe 66mo  Signed:  Crissie Sickles, MD           04/25/2021

## 2021-04-26 ENCOUNTER — Encounter: Payer: Self-pay | Admitting: Family Medicine

## 2021-04-26 ENCOUNTER — Other Ambulatory Visit: Payer: Self-pay | Admitting: Family Medicine

## 2021-04-26 LAB — TESTOSTERONE TOTAL,FREE,BIO, MALES
Albumin: 4.1 g/dL (ref 3.6–5.1)
Sex Hormone Binding: 34 nmol/L (ref 22–77)
Testosterone, Bioavailable: 66.6 ng/dL — ABNORMAL LOW (ref 110.0–575.0)
Testosterone, Free: 35.4 pg/mL — ABNORMAL LOW (ref 46.0–224.0)
Testosterone: 281 ng/dL (ref 250–827)

## 2021-04-26 LAB — PROLACTIN: Prolactin: 4.7 ng/mL (ref 2.0–18.0)

## 2021-04-26 MED ORDER — TESTOSTERONE CYPIONATE 200 MG/ML IJ SOLN: INTRAMUSCULAR | 0 refills | Status: DC

## 2021-04-27 ENCOUNTER — Telehealth: Payer: Self-pay

## 2021-04-27 DIAGNOSIS — E291 Testicular hypofunction: Secondary | ICD-10-CM

## 2021-04-27 NOTE — Telephone Encounter (Signed)
-----   Message from [name], MD sent at 04/26/2021  4:57 PM EST ----- Faythe Ghee, prescription sent in. Nonfasting lab visit as early in the morning as possible about 7 days after first injection.  Diagnosis secondary hypogonadism.

## 2021-05-16 ENCOUNTER — Encounter: Payer: Self-pay | Admitting: Family Medicine

## 2021-05-17 ENCOUNTER — Telehealth: Payer: Self-pay | Admitting: Family Medicine

## 2021-05-17 MED ORDER — DOXYCYCLINE HYCLATE 100 MG PO CAPS: 100.0000 mg | ORAL_CAPSULE | Freq: Two times a day (BID) | ORAL | 0 refills | Status: AC

## 2021-05-17 NOTE — Telephone Encounter (Signed)
Noted  

## 2021-05-17 NOTE — Telephone Encounter (Signed)
My fault. ?I called patient to let him know that I just sent in a prescription for doxycycline. ?

## 2021-05-17 NOTE — Telephone Encounter (Signed)
Pt sent mychart message requesting abx for recurrent abscess. No recent rx sent in. ? ? ?Please review and advise ? ?

## 2021-05-17 NOTE — Telephone Encounter (Signed)
Pt calling about antibiotic that he said would be called in for him. ? ?[facility], they informed him to call his PCP. ?  ? ?Pt cell: [phone] ? ? ?[id number] [place], [place] - [address] AT [address] Phone:  [phone]  ?Fax:  [phone]  ?  ? ? ?

## 2021-05-18 ENCOUNTER — Encounter: Payer: Self-pay | Admitting: Family Medicine

## 2021-06-04 ENCOUNTER — Telehealth: Payer: Self-pay | Admitting: Family Medicine

## 2021-06-04 NOTE — Telephone Encounter (Signed)
Yes he can just make a nurse appointment for the testosterone shot. ?His last Tdap was 07/11/2020.  He does not need another one for 10 years. ?Okay for diabetic supplies ?

## 2021-06-04 NOTE — Telephone Encounter (Signed)
Spoke with pt regarding medication and instructions.   ?Note:  ?Pt will like to know if he can have needle and supplies [facility] to [facility].  ?

## 2021-06-04 NOTE — Telephone Encounter (Signed)
Pt called about when is the last time he got his hooping cough shot.  ?Pt has question about Testerone shot. ?His last visit he was suppose to get the shot. ?However he went out of town, he wants to know can he still get the shot?  ? ?This is his first shot of Testerone & how to properly inject his self with it ? ?Pt cell: [phone] ?

## 2021-06-05 NOTE — Telephone Encounter (Signed)
Pt is scheduled for nurse visit and is aware of date of tdap ?

## 2021-06-06 ENCOUNTER — Other Ambulatory Visit: Payer: Self-pay

## 2021-06-06 ENCOUNTER — Ambulatory Visit (INDEPENDENT_AMBULATORY_CARE_PROVIDER_SITE_OTHER): Payer: 59

## 2021-06-06 DIAGNOSIS — E291 Testicular hypofunction: Secondary | ICD-10-CM | POA: Diagnosis not present

## 2021-06-06 MED ORDER — TESTOSTERONE CYPIONATE 200 MG/ML IM SOLN
200.0000 mg | Freq: Once | INTRAMUSCULAR | Status: AC
  Administered 2021-06-06: 200 mg via INTRAMUSCULAR

## 2021-06-06 MED ORDER — "SYRINGE/NEEDLE (DISP) 22G X 1-1/2"" 3 ML MISC": 1 refills | Status: DC

## 2021-06-06 MED ORDER — "SYRINGE/NEEDLE (DISP) 18G X 1"" 3 ML MISC": 1 refills | Status: DC

## 2021-06-06 NOTE — Progress Notes (Signed)
Pt came for education on how to do testosterone injection. Injection was done in office today by CMA. Pt tolerated injection well. ?

## 2021-06-14 ENCOUNTER — Ambulatory Visit (INDEPENDENT_AMBULATORY_CARE_PROVIDER_SITE_OTHER): Payer: 59

## 2021-06-14 ENCOUNTER — Ambulatory Visit: Payer: 59 | Admitting: Family Medicine

## 2021-06-14 DIAGNOSIS — E291 Testicular hypofunction: Secondary | ICD-10-CM

## 2021-06-15 ENCOUNTER — Telehealth: Payer: Self-pay | Admitting: Family Medicine

## 2021-06-15 LAB — TESTOSTERONE TOTAL,FREE,BIO, MALES
Albumin: 4.1 g/dL (ref 3.6–5.1)
Sex Hormone Binding: 30 nmol/L (ref 22–77)
Testosterone, Bioavailable: 143.1 ng/dL (ref 110.0–575.0)
Testosterone, Free: 76 pg/mL (ref 46.0–224.0)
Testosterone: 504 ng/dL (ref 250–827)

## 2021-06-15 NOTE — Telephone Encounter (Signed)
Spoke with patient regarding results/recommendations.  

## 2021-06-15 NOTE — Telephone Encounter (Signed)
Pt calling about lab results. ?Told pt someone will call him regarding results. ?Pt cell: [phone] ?

## 2021-06-26 ENCOUNTER — Telehealth: Payer: Self-pay | Admitting: Family Medicine

## 2021-06-26 MED ORDER — METHYLPHENIDATE HCL ER (CD) 20 MG PO CPCR: 20.0000 mg | ORAL_CAPSULE | Freq: Every day | ORAL | 0 refills | Status: DC | PRN

## 2021-06-26 NOTE — Telephone Encounter (Signed)
Requesting: methylphenidate ?Contract:01/26/21 ?UDS:01/26/21 ?Last Visit:04/25/21 ?Next Visit:07/12/21 ?Last Refill:04/25/21(30,0) ? ?Please Advise. Med pending ?

## 2021-07-05 ENCOUNTER — Other Ambulatory Visit: Payer: Self-pay | Admitting: Family Medicine

## 2021-07-05 MED ORDER — METHYLPHENIDATE HCL ER (CD) 20 MG PO CPCR: 20.0000 mg | ORAL_CAPSULE | Freq: Every day | ORAL | 0 refills | Status: DC | PRN

## 2021-07-05 NOTE — Telephone Encounter (Signed)
Please review and advise.

## 2021-07-05 NOTE — Telephone Encounter (Signed)
[facility] to [place] ? ?[facility] does not have med in stock.  [place] location does have med in stock ? ? ? methylphenidate (METADATE CD) 20 MG CR capsule ?

## 2021-07-05 NOTE — Telephone Encounter (Signed)
Ok, new methylphenidate rx sent ?

## 2021-07-06 NOTE — Telephone Encounter (Signed)
Pt advised refill sent. °

## 2021-07-12 ENCOUNTER — Telehealth: Payer: Self-pay

## 2021-07-12 ENCOUNTER — Encounter: Payer: Self-pay | Admitting: Family Medicine

## 2021-07-12 ENCOUNTER — Ambulatory Visit (INDEPENDENT_AMBULATORY_CARE_PROVIDER_SITE_OTHER): Payer: 59 | Admitting: Family Medicine

## 2021-07-12 VITALS — BP 117/68 | HR 98 | Temp 99.1°F | Ht 69.0 in | Wt 251.4 lb

## 2021-07-12 DIAGNOSIS — I10 Essential (primary) hypertension: Secondary | ICD-10-CM | POA: Diagnosis not present

## 2021-07-12 DIAGNOSIS — E291 Testicular hypofunction: Secondary | ICD-10-CM | POA: Diagnosis not present

## 2021-07-12 DIAGNOSIS — F988 Other specified behavioral and emotional disorders with onset usually occurring in childhood and adolescence: Secondary | ICD-10-CM

## 2021-07-12 DIAGNOSIS — E119 Type 2 diabetes mellitus without complications: Secondary | ICD-10-CM

## 2021-07-12 DIAGNOSIS — Z0000 Encounter for general adult medical examination without abnormal findings: Secondary | ICD-10-CM | POA: Diagnosis not present

## 2021-07-12 DIAGNOSIS — E7800 Pure hypercholesterolemia, unspecified: Secondary | ICD-10-CM | POA: Diagnosis not present

## 2021-07-12 DIAGNOSIS — Z125 Encounter for screening for malignant neoplasm of prostate: Secondary | ICD-10-CM | POA: Diagnosis not present

## 2021-07-12 DIAGNOSIS — R7989 Other specified abnormal findings of blood chemistry: Secondary | ICD-10-CM

## 2021-07-12 LAB — COMPREHENSIVE METABOLIC PANEL
ALT: 29 U/L (ref 0–53)
AST: 33 U/L (ref 0–37)
Albumin: 4.5 g/dL (ref 3.5–5.2)
Alkaline Phosphatase: 90 U/L (ref 39–117)
BUN: 19 mg/dL (ref 6–23)
CO2: 23 mEq/L (ref 19–32)
Calcium: 9.9 mg/dL (ref 8.4–10.5)
Chloride: 99 mEq/L (ref 96–112)
Creatinine, Ser: 0.72 mg/dL (ref 0.40–1.50)
GFR: 96.03 mL/min (ref >60.00)
Glucose, Bld: 117 mg/dL — ABNORMAL HIGH (ref 70–99)
Potassium: 5.1 mEq/L (ref 3.5–5.1)
Sodium: 133 mEq/L — ABNORMAL LOW (ref 135–145)
Total Bilirubin: 0.7 mg/dL (ref 0.2–1.2)
Total Protein: 7.3 g/dL (ref 6.0–8.3)

## 2021-07-12 LAB — LIPID PANEL
Cholesterol: 109 mg/dL (ref 0–200)
HDL: 54.6 mg/dL (ref >39.00)
LDL Cholesterol: 34 mg/dL (ref 0–99)
NonHDL: 54.62
Total CHOL/HDL Ratio: 2
Triglycerides: 105 mg/dL (ref 0.0–149.0)
VLDL: 21 mg/dL (ref 0.0–40.0)

## 2021-07-12 LAB — CBC
HCT: 46.2 % (ref 39.0–52.0)
Hemoglobin: 15.4 g/dL (ref 13.0–17.0)
MCHC: 33.4 g/dL (ref 30.0–36.0)
MCV: 108.3 fl — ABNORMAL HIGH (ref 78.0–100.0)
Platelets: 191 10*3/uL (ref 150.0–400.0)
RBC: 4.27 Mil/uL (ref 4.22–5.81)
RDW: 15.3 % (ref 11.5–15.5)
WBC: 6.6 10*3/uL (ref 4.0–10.5)

## 2021-07-12 LAB — MICROALBUMIN / CREATININE URINE RATIO
Creatinine,U: 61.8 mg/dL
Microalb Creat Ratio: 11.4 mg/g (ref 0.0–30.0)
Microalb, Ur: 7 mg/dL — ABNORMAL HIGH (ref 0.0–1.9)

## 2021-07-12 LAB — HEMOGLOBIN A1C: Hgb A1c MFr Bld: 6.6 % — ABNORMAL HIGH (ref 4.6–6.5)

## 2021-07-12 LAB — PSA, MEDICARE: PSA: 0.36 ng/ml (ref 0.10–4.00)

## 2021-07-12 MED ORDER — METHYLPHENIDATE HCL ER (CD) 20 MG PO CPCR: 20.0000 mg | ORAL_CAPSULE | Freq: Every day | ORAL | 0 refills | Status: DC | PRN

## 2021-07-12 MED ORDER — EMPAGLIFLOZIN 10 MG PO TABS: 10.0000 mg | ORAL_TABLET | Freq: Every day | ORAL | 1 refills | Status: DC

## 2021-07-12 MED ORDER — ACCU-CHEK GUIDE VI STRP: ORAL_STRIP | 0 refills | Status: DC

## 2021-07-12 MED ORDER — METFORMIN HCL 500 MG PO TABS: 500.0000 mg | ORAL_TABLET | Freq: Two times a day (BID) | ORAL | 1 refills | Status: DC

## 2021-07-12 MED ORDER — ATORVASTATIN CALCIUM 40 MG PO TABS: 40.0000 mg | ORAL_TABLET | Freq: Every day | ORAL | 1 refills | Status: DC

## 2021-07-12 NOTE — Telephone Encounter (Signed)
FYI ? ?[place] from [facility] called because they received a fax from our office regarding medical records for said patient.  ? ?Jilda Roche stated patient has not been seen in over 6 years there. ?No current records on file for patient.  ?

## 2021-07-12 NOTE — Patient Instructions (Addendum)
Call your insurer and ask about coverage for cialis, levitra, and viagra. ? ?Health Maintenance, Male ?Adopting a healthy lifestyle and getting preventive care are important in promoting health and wellness. Ask your health care provider about: ?The right schedule for you to have regular tests and exams. ?Things you can do on your own to prevent diseases and keep yourself healthy. ?What should I know about diet, weight, and exercise? ?Eat a healthy diet ? ?Eat a diet that includes plenty of vegetables, fruits, low-fat dairy products, and lean protein. ?Do not eat a lot of foods that are high in solid fats, added sugars, or sodium. ?Maintain a healthy weight ?Body mass index (BMI) is a measurement that can be used to identify possible weight problems. It estimates body fat based on height and weight. Your health care provider can help determine your BMI and help you achieve or maintain a healthy weight. ?Get regular exercise ?Get regular exercise. This is one of the most important things you can do for your health. Most adults should: ?Exercise for at least 150 minutes each week. The exercise should increase your heart rate and make you sweat (moderate-intensity exercise). ?Do strengthening exercises at least twice a week. This is in addition to the moderate-intensity exercise. ?Spend less time sitting. Even light physical activity can be beneficial. ?Watch cholesterol and blood lipids ?Have your blood tested for lipids and cholesterol at 65 years of age, then have this test every 5 years. ?You may need to have your cholesterol levels checked more often if: ?Your lipid or cholesterol levels are high. ?You are older than 65 years of age. ?You are at high risk for heart disease. ?What should I know about cancer screening? ?Many types of cancers can be detected early and may often be prevented. Depending on your health history and family history, you may need to have cancer screening at various ages. This may include  screening for: ?Colorectal cancer. ?Prostate cancer. ?Skin cancer. ?Lung cancer. ?What should I know about heart disease, diabetes, and high blood pressure? ?Blood pressure and heart disease ?High blood pressure causes heart disease and increases the risk of stroke. This is more likely to develop in people who have high blood pressure readings or are overweight. ?Talk with your health care provider about your target blood pressure readings. ?Have your blood pressure checked: ?Every 3-5 years if you are 49-64 years of age. ?Every year if you are 71 years old or older. ?If you are between the ages of 36 and 87 and are a current or former smoker, ask your health care provider if you should have a one-time screening for abdominal aortic aneurysm (AAA). ?Diabetes ?Have regular diabetes screenings. This checks your fasting blood sugar level. Have the screening done: ?Once every three years after age 36 if you are at a normal weight and have a low risk for diabetes. ?More often and at a younger age if you are overweight or have a high risk for diabetes. ?What should I know about preventing infection? ?Hepatitis B ?If you have a higher risk for hepatitis B, you should be screened for this virus. Talk with your health care provider to find out if you are at risk for hepatitis B infection. ?Hepatitis C ?Blood testing is recommended for: ?Everyone born from [birth year] through 1964. ?Anyone with known risk factors for hepatitis C. ?Sexually transmitted infections (STIs) ?You should be screened each year for STIs, including gonorrhea and chlamydia, if: ?You are sexually active and are younger than 65  years of age. ?You are older than 65 years of age and your health care provider tells you that you are at risk for this type of infection. ?Your sexual activity has changed since you were last screened, and you are at increased risk for chlamydia or gonorrhea. Ask your health care provider if you are at risk. ?Ask your health care  provider about whether you are at high risk for HIV. Your health care provider may recommend a prescription medicine to help prevent HIV infection. If you choose to take medicine to prevent HIV, you should first get tested for HIV. You should then be tested every 3 months for as long as you are taking the medicine. ?Follow these instructions at home: ?Alcohol use ?Do not drink alcohol if your health care provider tells you not to drink. ?If you drink alcohol: ?Limit how much you have to 0-2 drinks a day. ?Know how much alcohol is in your drink. In the [place] one drink equals one 12 oz bottle of beer (355 mL), one 5 oz glass of wine (148 mL), or one 1? oz glass of hard liquor (44 mL). ?Lifestyle ?Do not use any products that contain nicotine or tobacco. These products include cigarettes, chewing tobacco, and vaping devices, such as e-cigarettes. If you need help quitting, ask your health care provider. ?Do not use street drugs. ?Do not share needles. ?Ask your health care provider for help if you need support or information about quitting drugs. ?General instructions ?Schedule regular health, dental, and eye exams. ?Stay current with your vaccines. ?Tell your health care provider if: ?You often feel depressed. ?You have ever been abused or do not feel safe at home. ?Summary ?Adopting a healthy lifestyle and getting preventive care are important in promoting health and wellness. ?Follow your health care provider's instructions about healthy diet, exercising, and getting tested or screened for diseases. ?Follow your health care provider's instructions on monitoring your cholesterol and blood pressure. ?This information is not intended to replace advice given to you by your health care provider. Make sure you discuss any questions you have with your health care provider. ?Document Revised: 07/24/2020 Document Reviewed: 07/24/2020 ?Elsevier Patient Education ? [place]. ? ?

## 2021-07-12 NOTE — Progress Notes (Signed)
Office Note ?07/12/2021 ? ?CC:  ?Chief Complaint  ?Patient presents with  ? Annual Exam  ?  Pt is fasting  ? ?Patient is a 66 y.o. male who is here for annual health maintenance exam and 2 and 1/2 mo f/u DM 2, male hypogonadism, adult ADD. ?A/P as of last visit: ?"#1 loss of libido.  Erectile dysfunction accompanying this. ?Ordered testosterone level, prolactin, and LH today. ? ?2.  Diabetes type 2.  Doing well on Glucophage 500 mg twice a day and Jardiance 10 mg a day.  Hemoglobin A1c and electrolytes/creatinine today. ? ?3.  Hypertension, well controlled on lisinopril 10 mg a day. ?Electrolytes and creatinine today. ?  ?#4 hyperlipidemia.  Lipid panel excellent 3 months ago.  Tolerating atorvastatin 40 mg a day well. ?Recheck lipids and hepatic panel at next follow-up in 3 months. ? ?5.  Adult ADD.  Stable. ?Controlled substance contract up-to-date. ?Methylphenidate CD20 MG, 1 cap daily, prescription for each of the next 3 months sent in today" ? ?INTERIM HX: ?Kidneys feeling well. ?Compliant with all medications. ? ?He states that the testosterone injections have not helped any with his libido or energy level. ?Ongoing ED problems, asks about alternative to viagra (cost). ? ?ADD: Pt states all is going well with the med at current dosing: much improved focus, concentration, task completion.  Less frustration, better multitasking, less impulsivity and restlessness.  Mood is stable. ?No side effects from the medication. ? ? ?PMP AWARE reviewed today: most recent rx for methylphenidate was filled 07/05/2021, #30, rx by me. ?Most recent testosterone prescription filled 06/04/2021, 2 mL, prescription by me. ?No red flags. ? ? ?Past Medical History:  ?Diagnosis Date  ? Adult ADHD   ? Allergic rhinitis   ? Anxiety   ? claustrophobic  ? Colon polyps 2012 and 2013  ? All hyperplastic except one adenomatous w/out high grade dysplasia (recall 2023 per Dr. Deatra Ina)  ? COVID-19 virus infection 09/29/2020  ? Elevated  transaminase level 2017  ? Viral hep screening neg.  Suspect fatty liver.  ? Hyperlipidemia   ? Hypertension   ? Obesity   ? OSA on CPAP   ? Severe.  Wears CPAP but pt does not know his setting.  (Dr. Melvyn Novas follows him)  ? Seborrheic dermatitis of scalp   ? and face: hydrocort 2% cream bid prn to face, and fluocinonide 0.5% sol'n to scalp bid prn per Dr. Allyson Sabal (04/2014)  ? Secondary male hypogonadism 04/2021  ? Type 2 diabetes mellitus with microalbuminuria (HCC) DM dx-09/27/2015. Microalb 2020  ? ? ?Past Surgical History:  ?Procedure Laterality Date  ? COLONOSCOPY W/ POLYPECTOMY  04/2010; 10/2011  ? Initial screening TCS showed 6 polyps (one adenomatous).  Pt says he returned for repeat TCS 10/2011 showed one hyperplastic polyp: Recall 10 yrs per Dr. Deatra Ina  ? MOUTH SURGERY    ? ? ?Family History  ?Problem Relation Age of Onset  ? Lung cancer Father   ? Cancer Father   ?     nasal  ? Allergies Father   ? Colon cancer Neg Hx   ? Esophageal cancer Neg Hx   ? Rectal cancer Neg Hx   ? Stomach cancer Neg Hx   ? ? ?Social History  ? ?Socioeconomic History  ? Marital status: Married  ?  Spouse name: Not on file  ? Number of children: Not on file  ? Years of education: Not on file  ? Highest education level: Not on file  ?Occupational History  ?  Occupation: Counselling psychologist  ?Tobacco Use  ? Smoking status: Former  ?  Packs/day: 1.00  ?  Types: Cigarettes  ?  Quit date: 07/17/2002  ?  Years since quitting: 19.0  ? Smokeless tobacco: Never  ?Vaping Use  ? Vaping Use: Never used  ?Substance and Sexual Activity  ? Alcohol use: Yes  ?  Alcohol/week: 7.0 standard drinks  ?  Types: 7 Standard drinks or equivalent per week  ?  Comment: wine every night with dinner  ? Drug use: No  ? Sexual activity: Not on file  ?Other Topics Concern  ? Not on file  ?Social History Narrative  ? Married, 1 child--grown.  ? Occupation: Financial planner"  ? Tobacco: 20 pack-yr hx; quit 2004.  ? Alcohol: nightly cocktail  ? Drugs: none  ? Exercise: "walk  some when I can".  ? Diet: normal.  ? Right Handed. Used to be left handed  ? Three story home  ? Drinks a lot of tea and occasional coffee  ? ?Social Determinants of Health  ? ?Financial Resource Strain: Not on file  ?Food Insecurity: Not on file  ?Transportation Needs: Not on file  ?Physical Activity: Not on file  ?Stress: Not on file  ?Social Connections: Not on file  ?Intimate Partner Violence: Not on file  ? ? ?Outpatient Medications Prior to Visit  ?Medication Sig Dispense Refill  ? Accu-Chek Softclix Lancets lancets SMARTSIG:Topical    ? albuterol (VENTOLIN HFA) 108 (90 Base) MCG/ACT inhaler Inhale into the lungs.    ? lisinopril (ZESTRIL) 10 MG tablet Take 1 tablet (10 mg total) by mouth daily. 90 tablet 3  ? mupirocin ointment (BACTROBAN) 2 % 1 application twice a day to each nostril, each armpit, both groin creases, anal crease, and perineum for 7 days Repeat this process in 1 month 30 g 3  ? SYRINGE-NEEDLE, DISP, 3 ML 22G X 1-1/2" 3 ML MISC USE TO ADMINISTER TESTOSTERONE INJECTION EVERY 2 WEEKS 50 each 1  ? Syringe/Needle, Disp, 18G X 1" 3 ML MISC USE TO ADMINISTER TESTOSTERONE INJECTION EVERY 2 WEEKS 50 each 1  ? methylphenidate (METADATE CD) 20 MG CR capsule Take 1 capsule (20 mg total) by mouth daily as needed. 30 capsule 0  ? Testosterone Cypionate 200 MG/ML SOLN 200 mg (1 ml) SQ q 2 weeks 5 mL 0  ? atorvastatin (LIPITOR) 40 MG tablet TAKE 1 TABLET DAILY 90 tablet 1  ? glucose blood (ACCU-CHEK GUIDE) test strip USE TO TEST BLOOD SUGAR TWICE DAILY 200 strip 0  ? JARDIANCE 10 MG TABS tablet TAKE 1 TABLET DAILY BEFORE BREAKFAST 90 tablet 1  ? metFORMIN (GLUCOPHAGE) 500 MG tablet TAKE 1 TABLET TWICE A DAY WITH MEALS 180 tablet 1  ? ?No facility-administered medications prior to visit.  ? ? ?Allergies  ?Allergen Reactions  ? Lobster [Shellfish Allergy] Nausea Only  ? Poison Ivy Extract Itching  ? ? ?ROS ?Review of Systems  ?Constitutional:  Negative for appetite change, chills, fatigue and fever.  ?HENT:   Negative for congestion, dental problem, ear pain and sore throat.   ?Eyes:  Negative for discharge, redness and visual disturbance.  ?Respiratory:  Negative for cough, chest tightness, shortness of breath and wheezing.   ?Cardiovascular:  Negative for chest pain, palpitations and leg swelling.  ?Gastrointestinal:  Negative for abdominal pain, blood in stool, diarrhea, nausea and vomiting.  ?Genitourinary:  Negative for difficulty urinating, dysuria, flank pain, frequency, hematuria and urgency.  ?Musculoskeletal:  Negative for arthralgias, back pain, joint  swelling, myalgias and neck stiffness.  ?Skin:  Negative for pallor and rash.  ?Neurological:  Negative for dizziness, speech difficulty, weakness and headaches.  ?Hematological:  Negative for adenopathy. Does not bruise/bleed easily.  ?Psychiatric/Behavioral:  Negative for confusion and sleep disturbance. The patient is not nervous/anxious.   ? ?PE; ? ?  07/12/2021  ? 10:59 AM 04/25/2021  ? 11:28 AM 03/27/2021  ?  3:44 PM  ?Vitals with BMI  ?Height '5\' 9"'$  5' 9.5" 5' 9.5"  ?Weight 251 lbs 6 oz 251 lbs 253 lbs  ?BMI 37.11 36.55 36.84  ?Systolic 233 435 686  ?Diastolic 68 74 80  ?Pulse 98 78 99  ? ? ?Gen: Alert, well appearing.  Patient is oriented to person, place, time, and situation. ?AFFECT: pleasant, lucid thought and speech. ?ENT: Ears: EACs clear, normal epithelium.  TMs with good light reflex and landmarks bilaterally.  Eyes: no injection, icteris, swelling, or exudate.  EOMI, PERRLA. ?Nose: no drainage or turbinate edema/swelling.  No injection or focal lesion.  Mouth: lips without lesion/swelling.  Oral mucosa pink and moist.  Dentition intact and without obvious caries or gingival swelling.  Oropharynx without erythema, exudate, or swelling.  ?Neck: supple/nontender.  No LAD, mass, or TM.  Carotid pulses 2+ bilaterally, without bruits. ?CV: RRR, no m/r/g.   ?LUNGS: CTA bilat, nonlabored resps, good aeration in all lung fields. ?ABD: soft, NT, ND, BS normal.   No hepatospenomegaly or mass.  No bruits. ?EXT: no clubbing, cyanosis, or edema.  ?Musculoskeletal: no joint swelling, erythema, warmth, or tenderness.  ROM of all joints intact. ?Skin - no sores or suspiciou

## 2021-07-12 NOTE — Telephone Encounter (Signed)
noted 

## 2021-07-16 ENCOUNTER — Other Ambulatory Visit: Payer: Self-pay | Admitting: Family Medicine

## 2021-07-17 ENCOUNTER — Telehealth: Payer: Self-pay

## 2021-07-17 ENCOUNTER — Other Ambulatory Visit: Payer: Self-pay

## 2021-07-17 MED ORDER — LISINOPRIL 10 MG PO TABS: 10.0000 mg | ORAL_TABLET | Freq: Every day | ORAL | 1 refills | Status: DC

## 2021-07-17 NOTE — Telephone Encounter (Signed)
Refill sent to [facility], pt's wife advised. ?

## 2021-07-17 NOTE — Telephone Encounter (Signed)
Patient states  he should have 6 prescriptions at pharmacy.  They only have 5.  Missing Lisinopril.  Please call patient when completed. ? ?Please send to [facility] (NOT mail order) ? ?lisinopril (ZESTRIL) 10 MG tablet [[id number]]  ?

## 2021-08-04 ENCOUNTER — Other Ambulatory Visit: Payer: Self-pay | Admitting: Family Medicine

## 2021-08-06 ENCOUNTER — Other Ambulatory Visit: Payer: Self-pay

## 2021-08-06 MED ORDER — MUPIROCIN 2 % EX OINT: TOPICAL_OINTMENT | CUTANEOUS | 3 refills | Status: DC

## 2021-08-06 MED ORDER — METHYLPHENIDATE HCL ER (CD) 20 MG PO CPCR: 20.0000 mg | ORAL_CAPSULE | Freq: Every day | ORAL | 0 refills | Status: DC | PRN

## 2021-08-06 MED ORDER — DOXYCYCLINE HYCLATE 100 MG PO CAPS: 100.0000 mg | ORAL_CAPSULE | Freq: Two times a day (BID) | ORAL | 0 refills | Status: AC

## 2021-08-06 NOTE — Telephone Encounter (Signed)
Okay, I sent in doxycycline pills and authorized refill of Bactroban ointment today.

## 2021-08-06 NOTE — Addendum Note (Signed)
Addended by: [name] on: 08/06/2021 04:05 PM   Modules accepted: Orders

## 2021-08-06 NOTE — Telephone Encounter (Signed)
Pt advised refills sent. °

## 2021-08-06 NOTE — Telephone Encounter (Signed)
Patient states perineum abscess is back and he is leaving to go out of town tomorrow.  Patient requesting prescription for the antibiodic that Dr. Anitra Lauth prescribes for this.  [facility]

## 2021-08-06 NOTE — Telephone Encounter (Signed)
Pt called stating that he is needing a Rx for abscess and will be leaving to go out of town tomorrow. Pt states that he has had abx for it before and did not need to come in office then. Asked pt if he would be able to come for appt before leaving town and pt states he would not be able to make appt.

## 2021-08-06 NOTE — Telephone Encounter (Signed)
Pt advised.

## 2021-08-06 NOTE — Telephone Encounter (Signed)
Last refill completed 09/01/19 (30g,3). Please fill, if appropriate.

## 2021-08-06 NOTE — Telephone Encounter (Signed)
Requesting: methylphenidate Contract: 01/26/21 UDS: 01/26/21 Last Visit: 07/12/21 Next Visit: 10/11/21 Last Refill:07/12/21(30,0)  Please Advise. Med pending

## 2021-08-30 ENCOUNTER — Ambulatory Visit: Payer: 59 | Admitting: Family Medicine

## 2021-08-30 ENCOUNTER — Encounter: Payer: Self-pay | Admitting: Family Medicine

## 2021-08-30 VITALS — BP 126/72 | HR 105 | Temp 99.1°F | Ht 69.0 in | Wt 252.4 lb

## 2021-08-30 DIAGNOSIS — L02215 Cutaneous abscess of perineum: Secondary | ICD-10-CM

## 2021-08-30 DIAGNOSIS — L988 Other specified disorders of the skin and subcutaneous tissue: Secondary | ICD-10-CM

## 2021-08-30 MED ORDER — DOXYCYCLINE HYCLATE 100 MG PO CAPS: 100.0000 mg | ORAL_CAPSULE | Freq: Two times a day (BID) | ORAL | 0 refills | Status: AC

## 2021-08-30 MED ORDER — TERBINAFINE HCL 250 MG PO TABS: 250.0000 mg | ORAL_TABLET | Freq: Every day | ORAL | 0 refills | Status: DC

## 2021-08-30 NOTE — Progress Notes (Signed)
OFFICE VISIT  08/30/2021  CC:  Chief Complaint  Patient presents with   Perineal Abscess    Had one in Feb and May 2023; recurrent    Patient is a 66 y.o. male who presents for a boil.  HPI: She has had a problem with recurrent boil in the perineal area, near the anal opening. We have done a couple of rounds of antibiotics and it does get better for a while but then recurs a day or 2 after finishing the antibiotic.  It is draining pretty profusely, soiling his pants and underwear regularly, has to sit on towels, is in a lot of discomfort/pain.  No fevers.  Of note, this problem did seem to become more frequent/recurrent when we got him on Jardiance last year.   Past Medical History:  Diagnosis Date   Adult ADHD    Allergic rhinitis    Anxiety    claustrophobic   Colon polyps 2012 and 2013   All hyperplastic except one adenomatous w/out high grade dysplasia (recall 2023 per Dr. Deatra Ina)   COVID-19 virus infection 09/29/2020   Elevated transaminase level 2017   Viral hep screening neg.  Suspect fatty liver.   Hyperlipidemia    Hypertension    Obesity    OSA on CPAP    Severe.  Wears CPAP but pt does not know his setting.  (Dr. Melvyn Novas follows him)   Seborrheic dermatitis of scalp    and face: hydrocort 2% cream bid prn to face, and fluocinonide 0.5% sol'n to scalp bid prn per Dr. Allyson Sabal (04/2014)   Secondary male hypogonadism 04/2021   Type 2 diabetes mellitus with microalbuminuria ([place]) DM dx-09/27/2015. Microalb 2020    Past Surgical History:  Procedure Laterality Date   COLONOSCOPY W/ POLYPECTOMY  04/2010; 10/2011   Initial screening TCS showed 6 polyps (one adenomatous).  Pt says he returned for repeat TCS 10/2011 showed one hyperplastic polyp: Recall 10 yrs per Dr. Deatra Ina   MOUTH SURGERY      Outpatient Medications Prior to Visit  Medication Sig Dispense Refill   Accu-Chek Softclix Lancets lancets SMARTSIG:Topical     atorvastatin (LIPITOR) 40 MG tablet Take 1 tablet (40  mg total) by mouth daily. 90 tablet 1   lisinopril (ZESTRIL) 10 MG tablet Take 1 tablet (10 mg total) by mouth daily. 90 tablet 1   metFORMIN (GLUCOPHAGE) 500 MG tablet Take 1 tablet (500 mg total) by mouth 2 (two) times daily with a meal. 180 tablet 1   methylphenidate (METADATE CD) 20 MG CR capsule Take 1 capsule (20 mg total) by mouth daily as needed. 30 capsule 0   mupirocin ointment (BACTROBAN) 2 % 1 application twice a day to each nostril, each armpit, both groin creases, anal crease, and perineum for 7 days Repeat this process in 1 month 30 g 3   SYRINGE-NEEDLE, DISP, 3 ML 22G X 1-1/2" 3 ML MISC USE TO ADMINISTER TESTOSTERONE INJECTION EVERY 2 WEEKS 50 each 1   Syringe/Needle, Disp, 18G X 1" 3 ML MISC USE TO ADMINISTER TESTOSTERONE INJECTION EVERY 2 WEEKS 50 each 1   empagliflozin (JARDIANCE) 10 MG TABS tablet Take 1 tablet (10 mg total) by mouth daily before breakfast. 90 tablet 1   albuterol (VENTOLIN HFA) 108 (90 Base) MCG/ACT inhaler Inhale into the lungs. (Patient not taking: Reported on 08/30/2021)     glucose blood (ACCU-CHEK GUIDE) test strip USE TO TEST BLOOD SUGAR TWICE DAILY (Patient not taking: Reported on 08/30/2021) 200 strip 0  No facility-administered medications prior to visit.    Allergies  Allergen Reactions   Lobster [Shellfish Allergy] Nausea Only   Poison Ivy Extract Itching    ROS As per HPI  PE:    08/30/2021   11:15 AM 07/12/2021   10:59 AM 04/25/2021   11:28 AM  Vitals with BMI  Height '5\' 9"'$  '5\' 9"'$  5' 9.5"  Weight 252 lbs 6 oz 251 lbs 6 oz 251 lbs  BMI 37.26 54.98 26.41  Systolic 583 094 076  Diastolic 72 68 74  Pulse 808 98 78     Physical Exam  Gen: Alert, well appearing.  Patient is oriented to person, place, time, and situation. AFFECT: pleasant, lucid thought and speech. In the center of suprapubic area there is a 1 cm superficial ulceration with some induration around it but no fluctuance to suggest abscess.  Minimal drainage at the  surface. Diffuse tinea rash in the groin. In the perineum there is a 2 to 3 cm lobular subcutaneous fullness palpable and then a fistula is noted that 5 centimeters inferior to this, essentially at the anal verge. This fistula is continuously draining thin, clear fluid. Rectal exam: No fluctuant mass palpable.  LABS:  Last CBC Lab Results  Component Value Date   WBC 6.6 07/12/2021   HGB 15.4 07/12/2021   HCT 46.2 07/12/2021   MCV 108.3 (H) 07/12/2021   MCH 36.3 (H) 03/10/2019   RDW 15.3 07/12/2021   PLT 191.0 81/12/3157   Last metabolic panel Lab Results  Component Value Date   GLUCOSE 117 (H) 07/12/2021   NA 133 (L) 07/12/2021   K 5.1 07/12/2021   CL 99 07/12/2021   CO2 23 07/12/2021   BUN 19 07/12/2021   CREATININE 0.72 07/12/2021   CALCIUM 9.9 07/12/2021   PROT 7.3 07/12/2021   ALBUMIN 4.5 07/12/2021   BILITOT 0.7 07/12/2021   ALKPHOS 90 07/12/2021   AST 33 07/12/2021   ALT 29 07/12/2021   Last hemoglobin A1c Lab Results  Component Value Date   HGBA1C 6.6 (H) 07/12/2021   IMPRESSION AND PLAN:  #1 recurrent perineal abscess.  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 Restart doxycycline 100 twice daily.  Referral to general surgery. Stop Jardiance. I also prescribed terbinafine 250 mg to take once a day for 2 weeks for his tinea cruris.  An After Visit Summary was printed and given to the patient.  FOLLOW UP: Return if symptoms worsen or fail to improve.  Signed:  Crissie Sickles, MD           08/30/2021

## 2021-09-04 ENCOUNTER — Telehealth: Payer: Self-pay

## 2021-09-04 NOTE — Telephone Encounter (Signed)
Pt called stating that he is wanting to find out if any progress has been made for general surgery. Pt states he already has the number to call and was told that if he did not hear from office to call us. Pt is wanting to be scheduled before he goes on vacation on 09/20/21. Please advise

## 2021-10-01 ENCOUNTER — Ambulatory Visit: Payer: Self-pay | Admitting: General Surgery

## 2021-10-01 NOTE — H&P (Signed)
PROVIDER:  Monico Blitz, MD  MRN: [id number] DOB: [birth year] DATE OF ENCOUNTER: 10/01/2021  Subjective   Chief Complaint: No chief complaint on file.    History of Present Illness: Richard [name] is a 66 y.o. male who is presenting per request of McGowen, Adrian Blackwater, MD for evaluation of recurrent perineal abscesses.  He states his first occurrence was in February 2021 at which time he was evaluated in the ER and was found to have a spontaneously draining perineal abscess.  He was prescribed antibiotics.  The second occurrence was in May 2021 at which time it spontaneously drained in the ER and he had another round of antibiotics.  He did not have any issues in this region until February 2023.  He was given a course of antibiotics but he feels like his symptoms have been lingering since then.  He complains of constant mild pain but worse with movement.  Denies any obvious rectal bleeding but states this area has been draining intermittently for several months.  Has been taking antibiotics for the last couple weeks  He states he had a colonoscopy in 2012 and 2013.  He is due for colonoscopy in August 2023.  Review of Systems: A complete review of systems was obtained from the patient.  I have reviewed this information and discussed as appropriate with the patient.  See HPI as well for other ROS.  All other review of systems negative.   Medical History: Past Medical History:  Diagnosis Date   Diabetes mellitus without complication (CMS-HCC)    Sleep apnea     There is no problem list on file for this patient.   History reviewed. No pertinent surgical history.   No Known Allergies  Current Outpatient Medications on File Prior to Visit  Medication Sig Dispense Refill   albuterol 90 mcg/actuation inhaler Inhale into the lungs     atorvastatin (LIPITOR) 40 MG tablet Take 40 mg by mouth once daily     lisinopriL (ZESTRIL) 10 MG tablet Take 1 tablet by mouth once daily      metFORMIN (GLUCOPHAGE) 500 MG tablet Take by mouth     methylphenidate HCl (METADATE CD) 20 MG CR capsule Take by mouth     mupirocin (BACTROBAN) 2 % ointment 1 application twice a day to each nostril, each armpit, both groin creases, anal crease, and perineum for 7 days Repeat this process in 1 month     terbinafine HCL (LAMISIL) 250 mg tablet Take by mouth     No current facility-administered medications on file prior to visit.    Family History  Problem Relation Age of Onset   Breast cancer Mother    Skin cancer Mother      Social History   Tobacco Use  Smoking Status Former   Types: Cigarettes  Smokeless Tobacco Never     Social History   Socioeconomic History   Marital status: Married  Tobacco Use   Smoking status: Former    Types: Cigarettes   Smokeless tobacco: Never  Vaping Use   Vaping Use: Never used  Substance and Sexual Activity   Alcohol use: Yes   Drug use: Never    Objective:   There were no vitals taken for this visit. There is no height or weight on file to calculate BMI.  General: Well-developed, well-nourished, in no acute distress.   Eyes: No scleral icterus.  Pupils equal, lids normal Respiratory: Normal effort, no use of accessory muscles Musculoskeletal: Normal gait. Grossly  normal ROM upper and lower extremities  Psychiatric: Normal judgement and insight.  Normal mood and affect.  Alert, oriented x 3  Rectal: There is skin irritation/rawness around the rectum.  No external hemorrhoids or masses Perineum: Thin purulent drainage from multiple sites in the posterior scrotum, previously seen right-sided perianal sinus could not be visualized. Assessment and Plan:   Diagnoses and all orders for this visit:  Perineal abscess     Patient is presenting to the office with a draining anterior perineal wound.  This may be due to a perianal fistula or it could be due to a scrotal abscess.  Either way I recommend exam under anesthesia with I&D and  evaluation for possible fistula.  We discussed that if a fistula is found, we may be able to do a fistulotomy or place a seton depending on the amount of sphincter involvement.  This will be decided during the procedure.  If a scrotal abscess is found, we will drain this as much as possible and have him follow-up with urology.  No follow-ups on file.  Rosario Adie, MD Colon and Rectal Surgery [facility]

## 2021-10-04 ENCOUNTER — Encounter: Payer: Self-pay | Admitting: Family Medicine

## 2021-10-11 ENCOUNTER — Encounter: Payer: Self-pay | Admitting: Family Medicine

## 2021-10-11 ENCOUNTER — Ambulatory Visit: Payer: 59 | Admitting: Family Medicine

## 2021-10-11 VITALS — BP 133/77 | HR 79 | Temp 99.2°F | Ht 69.0 in | Wt 256.4 lb

## 2021-10-11 DIAGNOSIS — I10 Essential (primary) hypertension: Secondary | ICD-10-CM

## 2021-10-11 DIAGNOSIS — E291 Testicular hypofunction: Secondary | ICD-10-CM | POA: Diagnosis not present

## 2021-10-11 DIAGNOSIS — N529 Male erectile dysfunction, unspecified: Secondary | ICD-10-CM

## 2021-10-11 DIAGNOSIS — E1129 Type 2 diabetes mellitus with other diabetic kidney complication: Secondary | ICD-10-CM

## 2021-10-11 DIAGNOSIS — I872 Venous insufficiency (chronic) (peripheral): Secondary | ICD-10-CM

## 2021-10-11 DIAGNOSIS — R809 Proteinuria, unspecified: Secondary | ICD-10-CM

## 2021-10-11 DIAGNOSIS — F988 Other specified behavioral and emotional disorders with onset usually occurring in childhood and adolescence: Secondary | ICD-10-CM | POA: Diagnosis not present

## 2021-10-11 DIAGNOSIS — H9193 Unspecified hearing loss, bilateral: Secondary | ICD-10-CM

## 2021-10-11 LAB — POCT GLYCOSYLATED HEMOGLOBIN (HGB A1C)
HbA1c POC (<> result, manual entry): 7.2 % (ref 4.0–5.6)
HbA1c, POC (controlled diabetic range): 7.2 % — AB (ref 0.0–7.0)
HbA1c, POC (prediabetic range): 7.2 % — AB (ref 5.7–6.4)
Hemoglobin A1C: 7.2 % — AB (ref 4.0–5.6)

## 2021-10-11 MED ORDER — TESTOSTERONE CYPIONATE 200 MG/ML IJ SOLN
INTRAMUSCULAR | 1 refills | Status: DC
Start: 2021-10-11 — End: 2021-12-12

## 2021-10-11 MED ORDER — EMPAGLIFLOZIN 10 MG PO TABS: 10.0000 mg | ORAL_TABLET | Freq: Every day | ORAL | 1 refills | Status: DC

## 2021-10-11 NOTE — Progress Notes (Signed)
OFFICE VISIT  10/11/2021  CC:  Chief Complaint  Patient presents with   Diabetes   Hypertension   Patient is a 66 y.o. male who presents for 35-monthfollow-up diabetes, hypertension, and adult ADD.  INTERIM HX: Feeling well.  Diabetes: We stopped his Jardiance about 6 weeks ago due to suspicion that it may be contributing to his recurrent perineal abscess.  However, he said the surgeon that he saw does not think the Jardiance was related.  Currently says his abscess is not draining but still bothers him from a standpoint of soreness/pain. There are plans for him to have surgery on this in August.  No home glucose monitoring  HTN: No home blood pressure monitoring  Hypogonadism: He started testosterone supplements earlier this year, 200 mg IM every 2 weeks and this did bring his testosterone level up into the mid 500s and he did feel significant improvement in his libido However, he states he still has erectile dysfunction and wonders if he can use any of the Viagra preparations offered/advertised online.  ADD: Pt states all is going well with the med at current dosing: much improved focus, concentration, task completion.  Less frustration, better multitasking, less impulsivity and restlessness.  Mood is stable. No side effects from the medication. PMP AWARE reviewed today: most recent rx for Metadate was filled 10/06/2021, #30, rx by me. No red flags.  ROS as above, plus--> no fevers, no CP, no SOB, no wheezing, no cough, no dizziness, no HAs, no rashes, no melena/hematochezia.  No polyuria or polydipsia.  No myalgias or arthralgias.  No focal weakness, paresthesias, or tremors.  No acute vision or hearing abnormalities.  No dysuria or unusual/new urinary urgency or frequency.  He has chronic bilateral lower extremity edema that is little bit worse lately after going to the beach and eating excessive sodium-laden foods. No n/v/d or abd pain.  No palpitations.     Past Medical History:   Diagnosis Date   Adult ADHD    Allergic rhinitis    Anxiety    claustrophobic   Colon polyps 2012 and 2013   All hyperplastic except one adenomatous w/out high grade dysplasia (recall 2023 per Dr. KDeatra Ina   COVID-19 virus infection 09/29/2020   Elevated transaminase level 2017   Viral hep screening neg.  Suspect fatty liver.   Hyperlipidemia    Hypertension    Obesity    OSA on CPAP    Severe.  Wears CPAP but pt does not know his setting.  (Dr. WMelvyn Novasfollows him)   Perineal abscess    2022/2023, recurrent-->fistula->Dr. TMarcello Mooresto do surg   Seborrheic dermatitis of scalp    and face: hydrocort 2% cream bid prn to face, and fluocinonide 0.5% sol'n to scalp bid prn per Dr. LAllyson Sabal(04/2014)   Secondary male hypogonadism 04/2021   Type 2 diabetes mellitus with microalbuminuria ([facility]-09/27/2015. Microalb 2020    Past Surgical History:  Procedure Laterality Date   COLONOSCOPY W/ POLYPECTOMY  04/2010; 10/2011   Initial screening TCS showed 6 polyps (one adenomatous).  Pt says he returned for repeat TCS 10/2011 showed one hyperplastic polyp: Recall 10 yrs per Dr. KDeatra Ina  MOUTH SURGERY      Outpatient Medications Prior to Visit  Medication Sig Dispense Refill   Accu-Chek Softclix Lancets lancets SMARTSIG:Topical     atorvastatin (LIPITOR) 40 MG tablet Take 1 tablet (40 mg total) by mouth daily. 90 tablet 1   lisinopril (ZESTRIL) 10 MG tablet Take 1 tablet (  10 mg total) by mouth daily. 90 tablet 1   metFORMIN (GLUCOPHAGE) 500 MG tablet Take 1 tablet (500 mg total) by mouth 2 (two) times daily with a meal. 180 tablet 1   methylphenidate (METADATE CD) 20 MG CR capsule Take 1 capsule (20 mg total) by mouth daily as needed. 30 capsule 0   mupirocin ointment (BACTROBAN) 2 % 1 application twice a day to each nostril, each armpit, both groin creases, anal crease, and perineum for 7 days Repeat this process in 1 month 30 g 3   SYRINGE-NEEDLE, DISP, 3 ML 22G X 1-1/2" 3 ML MISC USE TO ADMINISTER  TESTOSTERONE INJECTION EVERY 2 WEEKS 50 each 1   Syringe/Needle, Disp, 18G X 1" 3 ML MISC USE TO ADMINISTER TESTOSTERONE INJECTION EVERY 2 WEEKS 50 each 1   terbinafine (LAMISIL) 250 MG tablet Take 1 tablet (250 mg total) by mouth daily. 1 tab po qd x 14d 14 tablet 0   albuterol (VENTOLIN HFA) 108 (90 Base) MCG/ACT inhaler Inhale into the lungs. (Patient not taking: Reported on 08/30/2021)     glucose blood (ACCU-CHEK GUIDE) test strip USE TO TEST BLOOD SUGAR TWICE DAILY (Patient not taking: Reported on 08/30/2021) 200 strip 0   No facility-administered medications prior to visit.    Allergies  Allergen Reactions   Lobster [Shellfish Allergy] Nausea Only   Poison Ivy Extract Itching    ROS As per HPI  PE:    10/11/2021    1:58 PM 10/11/2021    1:51 PM 08/30/2021   11:15 AM  Vitals with BMI  Height  '5\' 9"'$  '5\' 9"'$   Weight  256 lbs 6 oz 252 lbs 6 oz  BMI  08.67 61.95  Systolic 093 267 124  Diastolic 77 70 72  Pulse  79 105     Physical Exam  Gen: Alert, well appearing.  Patient is oriented to person, place, time, and situation.. AFFECT: pleasant, lucid thought and speech. EXT: 2-3 + bilat LL pitting edema, bronzing and fibrotic skin changes mild/mod degree  No weeping.  LABS:  Last CBC Lab Results  Component Value Date   WBC 6.6 07/12/2021   HGB 15.4 07/12/2021   HCT 46.2 07/12/2021   MCV 108.3 (H) 07/12/2021   MCH 36.3 (H) 03/10/2019   RDW 15.3 07/12/2021   PLT 191.0 58/11/9831   Last metabolic panel Lab Results  Component Value Date   GLUCOSE 117 (H) 07/12/2021   NA 133 (L) 07/12/2021   K 5.1 07/12/2021   CL 99 07/12/2021   CO2 23 07/12/2021   BUN 19 07/12/2021   CREATININE 0.72 07/12/2021   CALCIUM 9.9 07/12/2021   PROT 7.3 07/12/2021   ALBUMIN 4.5 07/12/2021   BILITOT 0.7 07/12/2021   ALKPHOS 90 07/12/2021   AST 33 07/12/2021   ALT 29 07/12/2021   Last lipids Lab Results  Component Value Date   CHOL 109 07/12/2021   HDL 54.60 07/12/2021   LDLCALC  34 07/12/2021   LDLDIRECT 139.9 06/13/2011   TRIG 105.0 07/12/2021   CHOLHDL 2 07/12/2021   Last hemoglobin A1c Lab Results  Component Value Date   HGBA1C 7.2 (A) 10/11/2021   HGBA1C 7.2 10/11/2021   HGBA1C 7.2 (A) 10/11/2021   HGBA1C 7.2 (A) 10/11/2021   Last thyroid functions Lab Results  Component Value Date   TSH 2.58 03/10/2019   Lab Results  Component Value Date   TESTOSTERONE 504 06/14/2021    IMPRESSION AND PLAN:  #1 diabetes type 2, control is  pretty good but worsened since being off Jardiance POC Hba1c today is 7.2%. Restart Jardiance 10 mg a day.  Continue metformin 500 mg twice daily.  #2 hypertension, well controlled on lisinopril 10 mg a day. Electrolytes and creatinine today.  3.  Adult ADD.  Long-term stability on Metadate CD 20 milligrams daily.  A new prescription was not needed for this today.  #4 secondary male hypogonadism.  Good response to testosterone 200 mg IM every 2 weeks. He has been a month or so since he had an injection.  I recommend he restarts this.  #5 erectile dysfunction.  Okay to try online advertised Viagra preparations.  #6 bilateral lower extremity edema/venous insufficiency. Reassured. Emphasized importance of low-sodium diet and periodic elevation.  No diuretics at this time.  #7 recurrent perineal abscess. Surgery planned for next month  #8 hearing impairment.  His wife complained that he cannot hear but he says he has no concern about his hearing.  He simply states he is focused/distracte. Audiogram here today showed adequate hearing at the 500, 1000, and 2000 Hz ranges but the 4000 Hz range is required 55 dB bilat for him to respond.  Consistent with presbycusis, no treatment needed at this time  An After Visit Summary was printed and given to the patient.  FOLLOW UP: Return in about 3 months (around 01/11/2022) for routine chronic illness f/u. Next cpe 06/2022  Signed:  Crissie Sickles, MD           10/11/2021

## 2021-10-12 ENCOUNTER — Other Ambulatory Visit: Payer: Self-pay | Admitting: Family Medicine

## 2021-10-12 LAB — BASIC METABOLIC PANEL
BUN: 17 mg/dL (ref 6–23)
CO2: 25 mEq/L (ref 19–32)
Calcium: 10.1 mg/dL (ref 8.4–10.5)
Chloride: 100 mEq/L (ref 96–112)
Creatinine, Ser: 0.65 mg/dL (ref 0.40–1.50)
GFR: 98.87 mL/min (ref >60.00)
Glucose, Bld: 121 mg/dL — ABNORMAL HIGH (ref 70–99)
Potassium: 4.5 mEq/L (ref 3.5–5.1)
Sodium: 135 mEq/L (ref 135–145)

## 2021-10-31 NOTE — Progress Notes (Signed)
COVID Vaccine Completed: yes x3  Date of COVID positive in last 90 days:  PCP - Shawnie Dapper, MD Cardiologist -   Chest x-ray -  EKG -  Stress Test -  ECHO -  Cardiac Cath -  Pacemaker/ICD device last checked: Spinal Cord Stimulator:  Bowel Prep -   Sleep Study -  CPAP -   Fasting Blood Sugar -  Checks Blood Sugar _____ times a day  Blood Thinner Instructions: Aspirin Instructions: Last Dose:  Activity level:  Can go up a flight of stairs and perform activities of daily living without stopping and without symptoms of chest pain or shortness of breath.  Able to exercise without symptoms  Unable to go up a flight of stairs without symptoms of     Anesthesia review:   Patient denies shortness of breath, fever, cough and chest pain at PAT appointment  Patient verbalized understanding of instructions that were given to them at the PAT appointment. Patient was also instructed that they will need to review over the PAT instructions again at home before surgery.

## 2021-11-01 ENCOUNTER — Encounter (HOSPITAL_COMMUNITY): Payer: Self-pay

## 2021-11-01 ENCOUNTER — Encounter (HOSPITAL_COMMUNITY)
Admission: RE | Admit: 2021-11-01 | Discharge: 2021-11-01 | Disposition: A | Discharge status: Home / Self Care | Payer: 59 | Source: Ambulatory Visit | Attending: General Surgery | Admitting: General Surgery

## 2021-11-01 VITALS — BP 161/85 | HR 91 | Temp 99.0°F | Resp 14 | Ht 70.0 in | Wt 251.0 lb

## 2021-11-01 DIAGNOSIS — E119 Type 2 diabetes mellitus without complications: Secondary | ICD-10-CM

## 2021-11-01 DIAGNOSIS — R030 Elevated blood-pressure reading, without diagnosis of hypertension: Secondary | ICD-10-CM | POA: Insufficient documentation

## 2021-11-01 DIAGNOSIS — Z01818 Encounter for other preprocedural examination: Secondary | ICD-10-CM | POA: Diagnosis present

## 2021-11-01 HISTORY — DX: Pneumonia, unspecified organism: J18.9

## 2021-11-01 LAB — CBC
HCT: 40.3 % (ref 39.0–52.0)
Hemoglobin: 14.1 g/dL (ref 13.0–17.0)
MCH: 36.4 pg — ABNORMAL HIGH (ref 26.0–34.0)
MCHC: 35 g/dL (ref 30.0–36.0)
MCV: 104.1 fL — ABNORMAL HIGH (ref 80.0–100.0)
Platelets: 200 10*3/uL (ref 150–400)
RBC: 3.87 MIL/uL — ABNORMAL LOW (ref 4.22–5.81)
RDW: 13.7 % (ref 11.5–15.5)
WBC: 6.9 10*3/uL (ref 4.0–10.5)
nRBC: 0 % (ref 0.0–0.2)

## 2021-11-01 LAB — GLUCOSE, CAPILLARY: Glucose-Capillary: 132 mg/dL — ABNORMAL HIGH (ref 70–99)

## 2021-11-01 NOTE — Patient Instructions (Addendum)
SURGICAL WAITING ROOM VISITATION Patients having surgery or a procedure may have no more than 2 support people in the waiting area - these visitors may rotate.   Children under the age of 17 must have an adult with them who is not the patient. If the patient needs to stay at the hospital during part of their recovery, the visitor guidelines for inpatient rooms apply. Pre-op nurse will coordinate an appropriate time for 1 support person to accompany patient in pre-op.  This support person may not rotate.    Please refer to the [facility] website for the visitor guidelines for Inpatients (after your surgery is over and you are in a regular room).    Your procedure is scheduled on: 11/07/21   Report to [facility] Main Entrance    Report to admitting at 4:30 AM   Call this number if you have problems the morning of surgery [phone]   Do not eat food :After Midnight.   After Midnight you may have the following liquids until 5:15 AM DAY OF SURGERY  Water Non-Citrus Juices (without pulp, NO RED) Carbonated Beverages Black Coffee (NO MILK/CREAM OR CREAMERS, sugar ok)  Clear Tea (NO MILK/CREAM OR CREAMERS, sugar ok) regular and decaf                             Plain Jell-O (NO RED)                                           Fruit ices (not with fruit pulp, NO RED)                                     Popsicles (NO RED)                                                               Sports drinks like Gatorade (NO RED)  FOLLOW BOWEL PREP AND ANY ADDITIONAL PRE OP INSTRUCTIONS YOU RECEIVED FROM YOUR SURGEON'S OFFICE!!!     Oral Hygiene is also important to reduce your risk of infection.                                    Remember - BRUSH YOUR TEETH THE MORNING OF SURGERY WITH YOUR REGULAR TOOTHPASTE   Take these medicines the morning of surgery with A SIP OF WATER: Inhalers, Atorvastatin   DO NOT TAKE ANY ORAL DIABETIC MEDICATIONS DAY OF YOUR SURGERY  How to Manage Your  Diabetes Before and After Surgery  Why is it important to control my blood sugar before and after surgery? Improving blood sugar levels before and after surgery helps healing and can limit problems. A way of improving blood sugar control is eating a healthy diet by:  Eating less sugar and carbohydrates  Increasing activity/exercise  Talking with your doctor about reaching your blood sugar goals High blood sugars (greater than 180 mg/dL) can raise your risk of infections and slow your recovery, so you  will need to focus on controlling your diabetes during the weeks before surgery. Make sure that the doctor who takes care of your diabetes knows about your planned surgery including the date and location.  How do I manage my blood sugar before surgery? Check your blood sugar at least 4 times a day, starting 2 days before surgery, to make sure that the level is not too high or low. Check your blood sugar the morning of your surgery when you wake up and every 2 hours until you get to the Short Stay unit. If your blood sugar is less than 70 mg/dL, you will need to treat for low blood sugar: Do not take insulin. Treat a low blood sugar (less than 70 mg/dL) with  cup of clear juice (cranberry or apple), 4 glucose tablets, OR glucose gel. Recheck blood sugar in 15 minutes after treatment (to make sure it is greater than 70 mg/dL). If your blood sugar is not greater than 70 mg/dL on recheck, call [phone] for further instructions. Report your blood sugar to the short stay nurse when you get to Short Stay.  If you are admitted to the hospital after surgery: Your blood sugar will be checked by the staff and you will probably be given insulin after surgery (instead of oral diabetes medicines) to make sure you have good blood sugar levels. The goal for blood sugar control after surgery is 80-180 mg/dL.   WHAT DO I DO ABOUT MY DIABETES MEDICATION?  Do not take oral diabetes medicines (pills) the  morning of surgery.  DO NOT TAKE JARDIANCE FOR 72 HOURS.  THE NIGHT BEFORE SURGERY, take Metformin as prescribed .      THE MORNING OF SURGERY, do not take Metformin.  Reviewed and Endorsed by [facility] Patient Education Committee, August 2015   Bring CPAP mask and tubing day of surgery.                              You may not have any metal on your body including hair pins, jewelry, and body piercing             Do not wear make-up, lotions, powders, perfumes/cologne, or deodorant  Do not wear nail polish including gel and S&S, artificial/acrylic nails, or any other type of covering on natural nails including finger and toenails. If you have artificial nails, gel coating, etc. that needs to be removed by a nail salon please have this removed prior to surgery or surgery may need to be canceled/ delayed if the surgeon/ anesthesia feels like they are unable to be safely monitored.   Do not shave  48 hours prior to surgery.               Men may shave face and neck.   Do not bring valuables to the hospital. [place].   Bring small overnight bag day of surgery.   DO NOT [place]. PHARMACY WILL DISPENSE MEDICATIONS LISTED ON YOUR MEDICATION LIST TO YOU DURING YOUR ADMISSION [place]!    Patients discharged on the day of surgery will not be allowed to drive home.  Someone NEEDS to stay with you for the first 24 hours after anesthesia.   Special Instructions: Bring a copy of your healthcare power of attorney and living  will documents         the day of surgery if you haven't scanned them before.              Please read over the following fact sheets you were given: IF YOU HAVE QUESTIONS ABOUT YOUR PRE-OP INSTRUCTIONS PLEASE CALL [phone]     [facility] - Preparing for Surgery Before surgery, you can play an important role.  Because skin is not sterile, your skin needs to be as free of  germs as possible.  You can reduce the number of germs on your skin by washing with CHG (chlorahexidine gluconate) soap before surgery.  CHG is an antiseptic cleaner which kills germs and bonds with the skin to continue killing germs even after washing. Please DO NOT use if you have an allergy to CHG or antibacterial soaps.  If your skin becomes reddened/irritated stop using the CHG and inform your nurse when you arrive at Short Stay. Do not shave (including legs and underarms) for at least 48 hours prior to the first CHG shower.  You may shave your face/neck.  Please follow these instructions carefully:  1.  Shower with CHG Soap the night before surgery and the  morning of surgery.  2.  If you choose to wash your hair, wash your hair first as usual with your normal  shampoo.  3.  After you shampoo, rinse your hair and body thoroughly to remove the shampoo.                             4.  Use CHG as you would any other liquid soap.  You can apply chg directly to the skin and wash.  Gently with a scrungie or clean washcloth.  5.  Apply the CHG Soap to your body ONLY FROM THE NECK DOWN.   Do   not use on face/ open                           Wound or open sores. Avoid contact with eyes, ears mouth and   genitals (private parts).                       Wash face,  Genitals (private parts) with your normal soap.             6.  Wash thoroughly, paying special attention to the area where your    surgery  will be performed.  7.  Thoroughly rinse your body with warm water from the neck down.  8.  DO NOT shower/wash with your normal soap after using and rinsing off the CHG Soap.                9.  Pat yourself dry with a clean towel.            10.  Wear clean pajamas.            11.  Place clean sheets on your bed the night of your first shower and do not  sleep with pets. Day of Surgery : Do not apply any lotions/deodorants the morning of surgery.  Please wear clean clothes to the hospital/surgery  center.  FAILURE TO FOLLOW THESE INSTRUCTIONS MAY RESULT IN THE CANCELLATION OF YOUR SURGERY  PATIENT SIGNATURE_________________________________  NURSE SIGNATURE__________________________________  ________________________________________________________________________

## 2021-11-06 NOTE — Anesthesia Preprocedure Evaluation (Signed)
Anesthesia Evaluation  Patient identified by MRN, date of birth, ID band Patient awake    Reviewed: Allergy & Precautions, Patient's Chart, lab work & pertinent test results  History of Anesthesia Complications Negative for: history of anesthetic complications  Airway Mallampati: III  TM Distance: >3 FB Neck ROM: Full    Dental  (+) Teeth Intact, Dental Advisory Given   Pulmonary sleep apnea and Continuous Positive Airway Pressure Ventilation , former smoker,    Pulmonary exam normal breath sounds clear to auscultation       Cardiovascular hypertension, Pt. on medications (-) angina(-) Past MI Normal cardiovascular exam Rhythm:Regular Rate:Normal     Neuro/Psych PSYCHIATRIC DISORDERS Anxiety negative neurological ROS     GI/Hepatic Neg liver ROS, PERINEAL ABSCESS   Endo/Other  diabetes, Type 2, Oral Hypoglycemic AgentsObesity   Renal/GU negative Renal ROS     Musculoskeletal  (+) Arthritis ,   Abdominal   Peds  Hematology negative hematology ROS (+)   Anesthesia Other Findings   Reproductive/Obstetrics                            Anesthesia Physical Anesthesia Plan  ASA: 3  Anesthesia Plan: General   Post-op Pain Management: Tylenol PO (pre-op)* and Toradol IV (intra-op)*   Induction: Intravenous  PONV Risk Score and Plan: 2 and Midazolam, Dexamethasone and Ondansetron  Airway Management Planned: Oral ETT  Additional Equipment:   Intra-op Plan:   Post-operative Plan: Extubation in OR  Informed Consent: I have reviewed the patients History and Physical, chart, labs and discussed the procedure including the risks, benefits and alternatives for the proposed anesthesia with the patient or authorized representative who has indicated his/her understanding and acceptance.     Dental advisory given  Plan Discussed with: CRNA  Anesthesia Plan Comments:        Anesthesia  Quick Evaluation

## 2021-11-07 ENCOUNTER — Ambulatory Visit (HOSPITAL_COMMUNITY)
Admission: RE | Admit: 2021-11-07 | Discharge: 2021-11-07 | Disposition: A | Discharge status: Home / Self Care | Payer: 59 | Attending: General Surgery | Admitting: General Surgery

## 2021-11-07 ENCOUNTER — Ambulatory Visit (HOSPITAL_COMMUNITY): Payer: 59 | Admitting: Anesthesiology

## 2021-11-07 ENCOUNTER — Other Ambulatory Visit: Payer: Self-pay

## 2021-11-07 ENCOUNTER — Ambulatory Visit (HOSPITAL_BASED_OUTPATIENT_CLINIC_OR_DEPARTMENT_OTHER): Payer: 59 | Admitting: Anesthesiology

## 2021-11-07 ENCOUNTER — Encounter (HOSPITAL_COMMUNITY): Payer: Self-pay | Admitting: General Surgery

## 2021-11-07 ENCOUNTER — Encounter (HOSPITAL_COMMUNITY)
Admission: RE | Disposition: A | Discharge status: Home / Self Care | Payer: Self-pay | Source: Home / Self Care | Attending: General Surgery

## 2021-11-07 DIAGNOSIS — Z87891 Personal history of nicotine dependence: Secondary | ICD-10-CM | POA: Insufficient documentation

## 2021-11-07 DIAGNOSIS — E119 Type 2 diabetes mellitus without complications: Secondary | ICD-10-CM | POA: Insufficient documentation

## 2021-11-07 DIAGNOSIS — Z9989 Dependence on other enabling machines and devices: Secondary | ICD-10-CM | POA: Diagnosis not present

## 2021-11-07 DIAGNOSIS — Z79899 Other long term (current) drug therapy: Secondary | ICD-10-CM | POA: Insufficient documentation

## 2021-11-07 DIAGNOSIS — F419 Anxiety disorder, unspecified: Secondary | ICD-10-CM | POA: Insufficient documentation

## 2021-11-07 DIAGNOSIS — Z7984 Long term (current) use of oral hypoglycemic drugs: Secondary | ICD-10-CM | POA: Insufficient documentation

## 2021-11-07 DIAGNOSIS — I10 Essential (primary) hypertension: Secondary | ICD-10-CM

## 2021-11-07 DIAGNOSIS — N492 Inflammatory disorders of scrotum: Secondary | ICD-10-CM | POA: Insufficient documentation

## 2021-11-07 DIAGNOSIS — G4733 Obstructive sleep apnea (adult) (pediatric): Secondary | ICD-10-CM | POA: Diagnosis not present

## 2021-11-07 DIAGNOSIS — M1990 Unspecified osteoarthritis, unspecified site: Secondary | ICD-10-CM | POA: Insufficient documentation

## 2021-11-07 HISTORY — PX: INCISION AND DRAINAGE ABSCESS: SHX5864

## 2021-11-07 LAB — GLUCOSE, CAPILLARY: Glucose-Capillary: 167 mg/dL — ABNORMAL HIGH (ref 70–99)

## 2021-11-07 SURGERY — INCISION AND DRAINAGE, ABSCESS
Anesthesia: General

## 2021-11-07 MED ORDER — CHLORHEXIDINE GLUCONATE 0.12 % MT SOLN
15.0000 mL | Freq: Once | OROMUCOSAL | Status: AC
  Administered 2021-11-07: 15 mL via OROMUCOSAL

## 2021-11-07 MED ORDER — OXYCODONE HCL 5 MG PO TABS: 5.0000 mg | ORAL_TABLET | ORAL | Status: DC | PRN

## 2021-11-07 MED ORDER — SODIUM CHLORIDE 0.9 % IV SOLN: 250.0000 mL | INTRAVENOUS | Status: DC | PRN

## 2021-11-07 MED ORDER — FENTANYL CITRATE PF 50 MCG/ML IJ SOSY: 25.0000 ug | PREFILLED_SYRINGE | INTRAMUSCULAR | Status: DC | PRN

## 2021-11-07 MED ORDER — DEXAMETHASONE SODIUM PHOSPHATE 10 MG/ML IJ SOLN
INTRAMUSCULAR | Status: AC
  Filled 2021-11-07: qty 1

## 2021-11-07 MED ORDER — BUPIVACAINE-EPINEPHRINE 0.5% -1:200000 IJ SOLN
INTRAMUSCULAR | Status: DC | PRN
  Administered 2021-11-07: 30 mL

## 2021-11-07 MED ORDER — LIDOCAINE 2% (20 MG/ML) 5 ML SYRINGE
INTRAMUSCULAR | Status: DC | PRN
  Administered 2021-11-07: 100 mg via INTRAVENOUS

## 2021-11-07 MED ORDER — ACETAMINOPHEN 650 MG RE SUPP: 650.0000 mg | RECTAL | Status: DC | PRN

## 2021-11-07 MED ORDER — PROPOFOL 10 MG/ML IV BOLUS
INTRAVENOUS | Status: DC | PRN
  Administered 2021-11-07: 50 mg via INTRAVENOUS
  Administered 2021-11-07: 150 mg via INTRAVENOUS

## 2021-11-07 MED ORDER — PROPOFOL 10 MG/ML IV BOLUS
INTRAVENOUS | Status: AC
  Filled 2021-11-07: qty 20

## 2021-11-07 MED ORDER — ONDANSETRON HCL 4 MG/2ML IJ SOLN
INTRAMUSCULAR | Status: AC
  Filled 2021-11-07: qty 2

## 2021-11-07 MED ORDER — BUPIVACAINE-EPINEPHRINE (PF) 0.5% -1:200000 IJ SOLN
INTRAMUSCULAR | Status: AC
  Filled 2021-11-07: qty 30

## 2021-11-07 MED ORDER — LACTATED RINGERS IV SOLN: INTRAVENOUS | Status: DC

## 2021-11-07 MED ORDER — BUPIVACAINE LIPOSOME 1.3 % IJ SUSP
INTRAMUSCULAR | Status: AC
  Filled 2021-11-07: qty 20

## 2021-11-07 MED ORDER — ORAL CARE MOUTH RINSE: 15.0000 mL | Freq: Once | OROMUCOSAL | Status: AC

## 2021-11-07 MED ORDER — ONDANSETRON HCL 4 MG/2ML IJ SOLN: 4.0000 mg | Freq: Once | INTRAMUSCULAR | Status: DC | PRN

## 2021-11-07 MED ORDER — 0.9 % SODIUM CHLORIDE (POUR BTL) OPTIME
TOPICAL | Status: DC | PRN
  Administered 2021-11-07: 1000 mL

## 2021-11-07 MED ORDER — PHENYLEPHRINE 80 MCG/ML (10ML) SYRINGE FOR IV PUSH (FOR BLOOD PRESSURE SUPPORT)
PREFILLED_SYRINGE | INTRAVENOUS | Status: AC
  Filled 2021-11-07: qty 10

## 2021-11-07 MED ORDER — ACETAMINOPHEN 325 MG PO TABS: 650.0000 mg | ORAL_TABLET | ORAL | Status: DC | PRN

## 2021-11-07 MED ORDER — ONDANSETRON HCL 4 MG/2ML IJ SOLN
INTRAMUSCULAR | Status: DC | PRN
  Administered 2021-11-07: 4 mg via INTRAVENOUS

## 2021-11-07 MED ORDER — TRAMADOL HCL 50 MG PO TABS: 50.0000 mg | ORAL_TABLET | Freq: Four times a day (QID) | ORAL | 0 refills | Status: DC | PRN

## 2021-11-07 MED ORDER — MIDAZOLAM HCL 2 MG/2ML IJ SOLN
INTRAMUSCULAR | Status: AC
  Filled 2021-11-07: qty 2

## 2021-11-07 MED ORDER — FENTANYL CITRATE (PF) 100 MCG/2ML IJ SOLN
INTRAMUSCULAR | Status: AC
  Filled 2021-11-07: qty 2

## 2021-11-07 MED ORDER — SODIUM CHLORIDE 0.9% FLUSH: 3.0000 mL | Freq: Two times a day (BID) | INTRAVENOUS | Status: DC

## 2021-11-07 MED ORDER — SUGAMMADEX SODIUM 200 MG/2ML IV SOLN
INTRAVENOUS | Status: DC | PRN
  Administered 2021-11-07: 200 mg via INTRAVENOUS

## 2021-11-07 MED ORDER — FENTANYL CITRATE (PF) 100 MCG/2ML IJ SOLN
INTRAMUSCULAR | Status: DC | PRN
  Administered 2021-11-07: 100 ug via INTRAVENOUS

## 2021-11-07 MED ORDER — LIDOCAINE 2% (20 MG/ML) 5 ML SYRINGE
INTRAMUSCULAR | Status: AC
  Filled 2021-11-07: qty 5

## 2021-11-07 MED ORDER — MIDAZOLAM HCL 5 MG/5ML IJ SOLN
INTRAMUSCULAR | Status: DC | PRN
  Administered 2021-11-07: 2 mg via INTRAVENOUS

## 2021-11-07 MED ORDER — ROCURONIUM BROMIDE 10 MG/ML (PF) SYRINGE
PREFILLED_SYRINGE | INTRAVENOUS | Status: AC
  Filled 2021-11-07: qty 10

## 2021-11-07 MED ORDER — ACETAMINOPHEN 500 MG PO TABS
1000.0000 mg | ORAL_TABLET | ORAL | Status: AC
  Administered 2021-11-07: 1000 mg via ORAL
  Filled 2021-11-07: qty 2

## 2021-11-07 MED ORDER — ROCURONIUM BROMIDE 10 MG/ML (PF) SYRINGE
PREFILLED_SYRINGE | INTRAVENOUS | Status: DC | PRN
  Administered 2021-11-07: 40 mg via INTRAVENOUS
  Administered 2021-11-07: 10 mg via INTRAVENOUS

## 2021-11-07 MED ORDER — SODIUM CHLORIDE 0.9% FLUSH: 3.0000 mL | INTRAVENOUS | Status: DC | PRN

## 2021-11-07 MED ORDER — BUPIVACAINE LIPOSOME 1.3 % IJ SUSP
INTRAMUSCULAR | Status: DC | PRN
  Administered 2021-11-07: 20 mL

## 2021-11-07 MED ORDER — DEXAMETHASONE SODIUM PHOSPHATE 10 MG/ML IJ SOLN
INTRAMUSCULAR | Status: DC | PRN
  Administered 2021-11-07: 10 mg via INTRAVENOUS

## 2021-11-07 MED ORDER — PHENYLEPHRINE 80 MCG/ML (10ML) SYRINGE FOR IV PUSH (FOR BLOOD PRESSURE SUPPORT)
PREFILLED_SYRINGE | INTRAVENOUS | Status: DC | PRN
  Administered 2021-11-07 (×3): 80 ug via INTRAVENOUS

## 2021-11-07 SURGICAL SUPPLY — 55 items
ADH SKN CLS APL DERMABOND .7 (GAUZE/BANDAGES/DRESSINGS)
APL SKNCLS STERI-STRIP NONHPOA (GAUZE/BANDAGES/DRESSINGS) ×1
BAG COUNTER SPONGE SURGICOUNT (BAG) IMPLANT
BAG SPNG CNTER NS LX DISP (BAG)
BENZOIN TINCTURE PRP APPL 2/3 (GAUZE/BANDAGES/DRESSINGS) ×1 IMPLANT
BLADE SURG 15 STRL LF DISP TIS (BLADE) IMPLANT
BLADE SURG 15 STRL SS (BLADE)
BLADE SURG SZ10 CARB STEEL (BLADE) ×1 IMPLANT
COVER SURGICAL LIGHT HANDLE (MISCELLANEOUS) ×1 IMPLANT
DERMABOND ADVANCED (GAUZE/BANDAGES/DRESSINGS)
DERMABOND ADVANCED .7 DNX12 (GAUZE/BANDAGES/DRESSINGS) IMPLANT
DRAIN PENROSE 0.5X18 (DRAIN) IMPLANT
DRAPE LAPAROTOMY TRNSV 102X78 (DRAPES) IMPLANT
DRAPE UTILITY XL STRL (DRAPES) ×1 IMPLANT
DRSG PAD ABDOMINAL 8X10 ST (GAUZE/BANDAGES/DRESSINGS) IMPLANT
DRSG VASELINE 3X18 (GAUZE/BANDAGES/DRESSINGS) IMPLANT
ELECT REM PT RETURN 15FT ADLT (MISCELLANEOUS) ×1 IMPLANT
GAUZE 4X4 16PLY ~~LOC~~+RFID DBL (SPONGE) IMPLANT
GAUZE SPONGE 4X4 12PLY STRL (GAUZE/BANDAGES/DRESSINGS) ×1 IMPLANT
GLOVE BIO SURGEON STRL SZ 6.5 (GLOVE) ×1 IMPLANT
GLOVE BIOGEL PI IND STRL 7.0 (GLOVE) ×1 IMPLANT
GLOVE BIOGEL PI INDICATOR 7.0 (GLOVE) ×1
GLOVE INDICATOR 6.5 STRL GRN (GLOVE) ×1 IMPLANT
GOWN STRL REUS W/ TWL XL LVL3 (GOWN DISPOSABLE) ×2 IMPLANT
GOWN STRL REUS W/TWL XL LVL3 (GOWN DISPOSABLE) ×2
KIT BASIN OR (CUSTOM PROCEDURE TRAY) ×1 IMPLANT
KIT TURNOVER KIT A (KITS) IMPLANT
LEGGING LITHOTOMY PAIR STRL (DRAPES) IMPLANT
LOOP VESSEL MAXI BLUE (MISCELLANEOUS) IMPLANT
NDL HYPO 25X1 1.5 SAFETY (NEEDLE) ×1 IMPLANT
NDL SAFETY ECLIPSE 18X1.5 (NEEDLE) IMPLANT
NEEDLE HYPO 18GX1.5 SHARP (NEEDLE)
NEEDLE HYPO 22GX1.5 SAFETY (NEEDLE) ×1 IMPLANT
NEEDLE HYPO 25X1 1.5 SAFETY (NEEDLE) ×1 IMPLANT
PACK BASIC VI WITH GOWN DISP (CUSTOM PROCEDURE TRAY) ×1 IMPLANT
PACK LITHOTOMY IV (CUSTOM PROCEDURE TRAY) ×1 IMPLANT
PANTS MESH DISP LRG (UNDERPADS AND DIAPERS) ×2 IMPLANT
PENCIL SMOKE EVACUATOR (MISCELLANEOUS) IMPLANT
SPIKE FLUID TRANSFER (MISCELLANEOUS) ×1 IMPLANT
SPONGE SURGIFOAM ABS GEL 12-7 (HEMOSTASIS) IMPLANT
STAPLER VISISTAT 35W (STAPLE) IMPLANT
SUT CHROMIC 2 0 SH (SUTURE) IMPLANT
SUT ETHIBOND 0 (SUTURE) IMPLANT
SUT ETHILON 2 0 PS N (SUTURE) IMPLANT
SUT GUT CHROMIC 3 0 (SUTURE) IMPLANT
SUT MON AB 3-0 SH 27 (SUTURE)
SUT MON AB 3-0 SH27 (SUTURE) ×1 IMPLANT
SUT VIC AB 3-0 SH 18 (SUTURE) IMPLANT
SUT VIC AB 4-0 P-3 18XBRD (SUTURE) IMPLANT
SUT VIC AB 4-0 P3 18 (SUTURE)
SUT VIC AB 4-0 PS2 18 (SUTURE) IMPLANT
SYR 20ML LL LF (SYRINGE) ×1 IMPLANT
SYR CONTROL 10ML LL (SYRINGE) ×1 IMPLANT
TOWEL OR 17X26 10 PK STRL BLUE (TOWEL DISPOSABLE) ×1 IMPLANT
TOWEL OR NON WOVEN STRL DISP B (DISPOSABLE) ×1 IMPLANT

## 2021-11-07 NOTE — Transfer of Care (Signed)
Immediate Anesthesia Transfer of Care Note  Patient: Richard [name]  Procedure(s) Performed: Incision and drainage of scrotal abscess  Patient Location: PACU  Anesthesia Type:General  Level of Consciousness: sedated  Airway & Oxygen Therapy: Patient Spontanous Breathing and Patient connected to face mask oxygen  Post-op Assessment: Report given to RN and Post -op Vital signs reviewed and stable  Post vital signs: Reviewed and stable  Last Vitals:  Vitals Value Taken Time  BP 150/88 11/07/21 0827  Temp    Pulse 108 11/07/21 0828  Resp 26 11/07/21 0828  SpO2 98 % 11/07/21 0828  Vitals shown include unvalidated device data.  Last Pain:  Vitals:   11/07/21 0630  TempSrc: Oral  PainSc:          Complications: No notable events documented.

## 2021-11-07 NOTE — Discharge Instructions (Addendum)

## 2021-11-07 NOTE — Anesthesia Postprocedure Evaluation (Signed)
Anesthesia Post Note  Patient: Richard [name]  Procedure(s) Performed: Incision and drainage of scrotal abscess     Patient location during evaluation: PACU Anesthesia Type: General Level of consciousness: awake and alert Pain management: pain level controlled Vital Signs Assessment: post-procedure vital signs reviewed and stable Respiratory status: spontaneous breathing, nonlabored ventilation, respiratory function stable and patient connected to nasal cannula oxygen Cardiovascular status: blood pressure returned to baseline and stable Postop Assessment: no apparent nausea or vomiting Anesthetic complications: no   No notable events documented.  Last Vitals:  Vitals:   11/07/21 0900 11/07/21 0904  BP: 119/72 119/72  Pulse: 75 70  Resp: 18 18  Temp: 36.7 C 36.7 C  SpO2: 94% 95%    Last Pain:  Vitals:   11/07/21 0915  TempSrc:   PainSc: 0-No pain                 Santa Lighter

## 2021-11-07 NOTE — Op Note (Signed)
11/07/2021  8:31 AM  PATIENT:  Richard [name]  66 y.o. male  Patient Care Team: [name], MD as PCP - General (Family Medicine) Lyndal Pulley, DO as Consulting Physician (Sports Medicine) Druscilla Brownie, MD as Consulting Physician (Dermatology) Idolina Primer, Warnell Bureau (Optometry) Alda Berthold, DO as Consulting Physician (Neurology) Maczis, Carlena Hurl, PA-C as Physician Assistant (General Surgery) Leighton Ruff, MD as Consulting Physician (General Surgery)  PRE-OPERATIVE DIAGNOSIS:  PERINEAL ABSCESS  POST-OPERATIVE DIAGNOSIS:  SCROTAL ABSCESS  PROCEDURE:  Incision and drainage of scrotal abscess    Surgeon(s): Leighton Ruff, MD  ASSISTANT: none   ANESTHESIA:   local and general  SPECIMEN:  No Specimen  DISPOSITION OF SPECIMEN:  N/A  COUNTS:  YES  PLAN OF CARE: Discharge to home after PACU  PATIENT DISPOSITION:  PACU - hemodynamically stable.  INDICATION: 66 y.o. M with draining perineal sinus   OR FINDINGS: posterior scrotal abscess  DESCRIPTION: the patient was identified in the preoperative holding area and taken to the OR where they were laid on the operating room table.  General anesthesia was induced without difficulty. The patient was then positioned in prone jackknife position with buttocks gently taped apart.  The patient was then prepped and draped in usual sterile fashion.  SCDs were noted to be in place prior to the initiation of anesthesia. A surgical timeout was performed indicating the correct patient, procedure, positioning and need for preoperative antibiotics.  A field block was performed using Marcaine with epinephrine mixed with Experel.    I began by identifying the draining sinus.  I then used an S shaped fistula probe to tract the fistula.  This easily tracked into the scrotal region.  It did not track towards the anal canal at first.  An incision was made over the posterior scrotum with electrocautery.  The cavity was opened and drained.   There was no communication with the scrotal contents.  I used a fistula probe to identify any further fistula tracts.  There was one tract that headed anteriorly toward the anal canal for approximately 2 to 3 cm.  This was opened as well.  It did not track any further towards the anal canal.  It did not traverse the sphincter complex.  With this information in mind, it was felt to be a scrotal abscess.  Given the size of the incision I decided to place a Penrose drain within the abscess cavity and closed the skin over this using 2-0 nylon mattress sutures.  Additional Marcaine was placed around the incision sites.  A sterile dressing was applied.  The patient was awakened from anesthesia and sent to the postanesthesia care unit in stable condition.  All counts were correct per operating room staff.  Rosario Adie, MD  [facility]

## 2021-11-07 NOTE — Anesthesia Procedure Notes (Signed)
Procedure Name: Intubation Date/Time: 11/07/2021 7:33 AM  Performed by: Lind Covert, CRNAPre-anesthesia Checklist: Patient identified, Emergency Drugs available, Patient being monitored, Suction available and Timeout performed Patient Re-evaluated:Patient Re-evaluated prior to induction Oxygen Delivery Method: Circle system utilized Preoxygenation: Pre-oxygenation with 100% oxygen Induction Type: IV induction Ventilation: Mask ventilation without difficulty and Oral airway inserted - appropriate to patient size Laryngoscope Size: Mac and 4 Grade View: Grade II Tube type: Oral Tube size: 8.0 mm Number of attempts: 1 Airway Equipment and Method: Stylet Placement Confirmation: ETT inserted through vocal cords under direct vision, positive ETCO2 and breath sounds checked- equal and bilateral Secured at: 23 cm Tube secured with: Tape Dental Injury: Teeth and Oropharynx as per pre-operative assessment

## 2021-11-07 NOTE — H&P (Signed)
PROVIDER:  Monico Blitz, MD   MRN: [id number] DOB: January 13, 1956    Subjective    Chief Complaint: No chief complaint on file.     History of Present Illness: Richard [name] is a 66 y.o. male who is presenting per request of McGowen, Adrian Blackwater, MD for evaluation of recurrent perineal abscesses.  He states his first occurrence was in February 2021 at which time he was evaluated in the ER and was found to have a spontaneously draining perineal abscess.  He was prescribed antibiotics.  The second occurrence was in May 2021 at which time it spontaneously drained in the ER and he had another round of antibiotics.  He did not have any issues in this region until February 2023.  He was given a course of antibiotics but he feels like his symptoms have been lingering since then.  He complains of constant mild pain but worse with movement.  Denies any obvious rectal bleeding but states this area has been draining intermittently for several months.  Has been taking antibiotics for the last couple weeks   He states he had a colonoscopy in 2012 and 2013.  He is due for colonoscopy in August 2023.   Review of Systems: A complete review of systems was obtained from the patient.  I have reviewed this information and discussed as appropriate with the patient.  See HPI as well for other ROS.   All other review of systems negative.     Medical History:     Past Medical History:  Diagnosis Date   Diabetes mellitus without complication (CMS-HCC)     Sleep apnea        There is no problem list on file for this patient.     History reviewed. No pertinent surgical history.    No Known Allergies         Current Outpatient Medications on File Prior to Visit  Medication Sig Dispense Refill   albuterol 90 mcg/actuation inhaler Inhale into the lungs       atorvastatin (LIPITOR) 40 MG tablet Take 40 mg by mouth once daily       lisinopriL (ZESTRIL) 10 MG tablet Take 1 tablet by mouth once daily        metFORMIN (GLUCOPHAGE) 500 MG tablet Take by mouth       methylphenidate HCl (METADATE CD) 20 MG CR capsule Take by mouth       mupirocin (BACTROBAN) 2 % ointment 1 application twice a day to each nostril, each armpit, both groin creases, anal crease, and perineum for 7 days Repeat this process in 1 month       terbinafine HCL (LAMISIL) 250 mg tablet Take by mouth        No current facility-administered medications on file prior to visit.           Family History  Problem Relation Age of Onset   Breast cancer Mother     Skin cancer Mother        Social History        Tobacco Use  Smoking Status Former   Types: Cigarettes  Smokeless Tobacco Never      Social History         Socioeconomic History   Marital status: Married  Tobacco Use   Smoking status: Former      Types: Cigarettes   Smokeless tobacco: Never  Vaping Use   Vaping Use: Never used  Substance and Sexual Activity   Alcohol  use: Yes   Drug use: Never      Objective:    Vitals:   11/07/21 0630  BP: (!) 150/73  Pulse: (!) 114  Resp: 18  Temp: 99.2 F (37.3 C)  SpO2: 93%      General: Well-developed, well-nourished, in no acute distress.   Eyes: No scleral icterus.  Pupils equal, lids normal Respiratory: Normal effort, no use of accessory muscles Musculoskeletal: Normal gait. Grossly normal ROM upper and lower extremities  Psychiatric: Normal judgement and insight.  Normal mood and affect.  Alert, oriented x 3   Rectal: There is skin irritation/rawness around the rectum.  No external hemorrhoids or masses Perineum: Thin purulent drainage from multiple sites in the posterior scrotum, previously seen right-sided perianal sinus could not be visualized. Assessment and Plan:    Diagnoses and all orders for this visit:   Perineal abscess       Patient is presenting to the office with a draining anterior perineal wound.  This may be due to a perianal fistula or it could be due to a scrotal  abscess.  Either way I recommend exam under anesthesia with I&D and evaluation for possible fistula.  We discussed that if a fistula is found, we may be able to do a fistulotomy or place a seton depending on the amount of sphincter involvement.  This will be decided during the procedure.  If a scrotal abscess is found, we will drain this as much as possible and have him follow-up with urology.   No follow-ups on file.   Rosario Adie, MD Colon and Rectal Surgery [facility]

## 2021-11-08 ENCOUNTER — Encounter (HOSPITAL_COMMUNITY): Payer: Self-pay | Admitting: General Surgery

## 2021-11-28 ENCOUNTER — Encounter: Payer: Self-pay | Admitting: Gastroenterology

## 2021-12-12 ENCOUNTER — Other Ambulatory Visit: Payer: Self-pay | Admitting: Family Medicine

## 2021-12-12 MED ORDER — METHYLPHENIDATE HCL ER (CD) 20 MG PO CPCR: 20.0000 mg | ORAL_CAPSULE | Freq: Every day | ORAL | 0 refills | Status: DC | PRN

## 2021-12-12 NOTE — Telephone Encounter (Signed)
Requesting: methylphenidate '20mg'$  Contract: 01/26/21 UDS: 01/26/21 Last Visit: 10/11/21 Next Visit: 01/23/22 Last Refill: 08/06/21(30,0)  Please Advise. Medication pending

## 2021-12-12 NOTE — Telephone Encounter (Signed)
Requesting: testosterone Contract: n/a UDS: labs 06/14/21 Last Visit: 10/11/21 Next Visit: 01/23/22 Last Refill: 10/11/21 (56m,1)  Please Advise. Med pending

## 2021-12-13 NOTE — Telephone Encounter (Signed)
Pt advised refill sent via mychart.

## 2021-12-25 ENCOUNTER — Telehealth: Payer: Self-pay | Admitting: Family Medicine

## 2021-12-25 NOTE — Telephone Encounter (Signed)
Pt is requesting medication for diarrhea, he has been having it for about 4 days now. He reports that he will be traveling out of town this weekend for work, and that something can be called in to help with this ongoing issue.

## 2021-12-26 MED ORDER — DIPHENOXYLATE-ATROPINE 2.5-0.025 MG PO TABS: ORAL_TABLET | ORAL | 0 refills | Status: DC

## 2021-12-26 NOTE — Telephone Encounter (Signed)
Okay, Lomotil prescription sent.

## 2021-12-26 NOTE — Telephone Encounter (Signed)
Spoke with patient regarding results/recommendations.  

## 2021-12-26 NOTE — Telephone Encounter (Signed)
LM for pt to return call to discuss.  

## 2022-01-09 ENCOUNTER — Other Ambulatory Visit: Payer: Self-pay | Admitting: Family Medicine

## 2022-01-10 ENCOUNTER — Other Ambulatory Visit: Payer: Self-pay | Admitting: Family Medicine

## 2022-01-12 ENCOUNTER — Telehealth: Payer: Self-pay | Admitting: Internal Medicine

## 2022-01-12 MED ORDER — DIPHENOXYLATE-ATROPINE 2.5-0.025 MG PO TABS: 1.0000 | ORAL_TABLET | Freq: Four times a day (QID) | ORAL | 0 refills | Status: DC | PRN

## 2022-01-12 NOTE — Telephone Encounter (Signed)
Prior patient of Dr. Deatra Ina.  He called me as the on-call provider today.  He has had ongoing diarrhea for the last 3 weeks.  Started somewhat abruptly without fever or other illness.  Has fluctuated some but been rather consistent.  Watery, brown, nonbloody and nonmelenic.  Some occasional mid abdominal pain but not a major component.  No response to probiotic.  Primary care tried Lomotil but it does not seem to help.  He also has tried Pepto-Bismol without much relief.  Is planning to try loperamide today.  Bowel movements can occur more than 6 times per day but not nocturnal. Urgent and somewhat explosive  Requesting further evaluation and to establish with a new [facility] provider  At this point I recommend the following:  Establish with new [facility] provider; I will ask Dr. [name] Stool studies to include GI pathogen panel and fecal leukocytes I will refill Lomotil until test results

## 2022-01-14 ENCOUNTER — Telehealth: Payer: Self-pay

## 2022-01-14 ENCOUNTER — Other Ambulatory Visit: Payer: Self-pay

## 2022-01-14 DIAGNOSIS — R197 Diarrhea, unspecified: Secondary | ICD-10-CM

## 2022-01-14 NOTE — Telephone Encounter (Signed)
Cirigliano, Dominic Pea, DO  Marice Potter, RN Mickel Baas,   Can you please assist with the following for this patient:  - GI PCR panel, calprotectin, ESR, CRP, CBC, BMP  - Expedited OV with me. 10 or 2:40 appt perfectly fine. To have labs done first.

## 2022-01-14 NOTE — Telephone Encounter (Signed)
Spoke with pt to see when he would be able to come in for labs. Pt stated that he could come to lab on Friday 01/18/22. Appointment scheduled Tuesday November 7th at 10 am. Pt verbalized understanding and had no other concerns at end of call.

## 2022-01-16 ENCOUNTER — Other Ambulatory Visit: Payer: Self-pay | Admitting: Family Medicine

## 2022-01-16 DIAGNOSIS — Z933 Colostomy status: Secondary | ICD-10-CM

## 2022-01-16 HISTORY — PX: COLECTOMY: SHX59

## 2022-01-16 HISTORY — DX: Colostomy status: Z93.3

## 2022-01-16 MED ORDER — METHYLPHENIDATE HCL ER (CD) 20 MG PO CPCR
20.0000 mg | ORAL_CAPSULE | Freq: Every day | ORAL | 0 refills | Status: DC | PRN
Start: 2022-01-16 — End: 2022-03-04

## 2022-01-16 NOTE — Telephone Encounter (Signed)
Pt needing refill on Methylphenidate.

## 2022-01-16 NOTE — Telephone Encounter (Signed)
Requesting:Methylphenidate Contract:01/26/21 UDS:01/26/21 Last Visit:10/11/21 Next Visit:01/23/22 Last Refill:12/12/21 (30,0)  Please Advise

## 2022-01-18 ENCOUNTER — Other Ambulatory Visit (INDEPENDENT_AMBULATORY_CARE_PROVIDER_SITE_OTHER): Payer: 59

## 2022-01-18 DIAGNOSIS — R197 Diarrhea, unspecified: Secondary | ICD-10-CM

## 2022-01-18 LAB — SEDIMENTATION RATE: Sed Rate: 72 mm/hr — ABNORMAL HIGH (ref 0–20)

## 2022-01-18 LAB — BASIC METABOLIC PANEL
BUN: 12 mg/dL (ref 6–23)
CO2: 26 mEq/L (ref 19–32)
Calcium: 9 mg/dL (ref 8.4–10.5)
Chloride: 95 mEq/L — ABNORMAL LOW (ref 96–112)
Creatinine, Ser: 0.52 mg/dL (ref 0.40–1.50)
GFR: 105.56 mL/min (ref >60.00)
Glucose, Bld: 108 mg/dL — ABNORMAL HIGH (ref 70–99)
Potassium: 5.2 mEq/L — ABNORMAL HIGH (ref 3.5–5.1)
Sodium: 132 mEq/L — ABNORMAL LOW (ref 135–145)

## 2022-01-18 LAB — C-REACTIVE PROTEIN: CRP: 17.9 mg/dL (ref 0.5–20.0)

## 2022-01-18 LAB — CBC WITH DIFFERENTIAL/PLATELET
Basophils Absolute: 0.1 10*3/uL (ref 0.0–0.1)
Basophils Relative: 0.6 % (ref 0.0–3.0)
Eosinophils Absolute: 0.2 10*3/uL (ref 0.0–0.7)
Eosinophils Relative: 2 % (ref 0.0–5.0)
HCT: 39.2 % (ref 39.0–52.0)
Hemoglobin: 13.3 g/dL (ref 13.0–17.0)
Lymphocytes Relative: 8.2 % — ABNORMAL LOW (ref 12.0–46.0)
Lymphs Abs: 0.9 10*3/uL (ref 0.7–4.0)
MCHC: 34 g/dL (ref 30.0–36.0)
MCV: 105.5 fl — ABNORMAL HIGH (ref 78.0–100.0)
Monocytes Absolute: 0.9 10*3/uL (ref 0.1–1.0)
Monocytes Relative: 8.3 % (ref 3.0–12.0)
Neutro Abs: 8.6 10*3/uL — ABNORMAL HIGH (ref 1.4–7.7)
Neutrophils Relative %: 80.9 % — ABNORMAL HIGH (ref 43.0–77.0)
Platelets: 282 10*3/uL (ref 150.0–400.0)
RBC: 3.71 Mil/uL — ABNORMAL LOW (ref 4.22–5.81)
RDW: 14.1 % (ref 11.5–15.5)
WBC: 10.6 10*3/uL — ABNORMAL HIGH (ref 4.0–10.5)

## 2022-01-21 ENCOUNTER — Other Ambulatory Visit: Payer: 59

## 2022-01-21 DIAGNOSIS — R197 Diarrhea, unspecified: Secondary | ICD-10-CM

## 2022-01-22 ENCOUNTER — Ambulatory Visit (INDEPENDENT_AMBULATORY_CARE_PROVIDER_SITE_OTHER): Payer: 59 | Admitting: Gastroenterology

## 2022-01-22 ENCOUNTER — Encounter: Payer: Self-pay | Admitting: Gastroenterology

## 2022-01-22 VITALS — BP 122/82 | HR 124 | Ht 70.0 in | Wt 243.0 lb

## 2022-01-22 DIAGNOSIS — R197 Diarrhea, unspecified: Secondary | ICD-10-CM | POA: Diagnosis not present

## 2022-01-22 DIAGNOSIS — R152 Fecal urgency: Secondary | ICD-10-CM

## 2022-01-22 DIAGNOSIS — Z860100 Personal history of colon polyps, unspecified: Secondary | ICD-10-CM

## 2022-01-22 DIAGNOSIS — R194 Change in bowel habit: Secondary | ICD-10-CM

## 2022-01-22 NOTE — Progress Notes (Signed)
Chief Complaint: Diarrhea   Referring Provider:     Self   HPI:     Richard [name] is a 66 y.o. male referred to the Gastroenterology Clinic for evaluation of nonbloody diarrhea.  Symptoms started abruptly on 10/7.  No associated fever, chills, hematochezia, melena.  Does have some associated mid/upper abdominal pain, but this is not terribly bothersome.    Having up to 4-8 loose, watery, nonbloody stools per day with urgency. +fecal leakage. No nocturnal stools. No n/v/f/c.   No response to trial of probiotics, Pepto-Bismol.  Has had improvement with Lomotil and Imodium.  Still with fecal leakage.  Has been wearing depends, but has actually been wearing them since July due to scrotal abscess.  He underwent scrotal abscess surgery by Dr. [name] on 11/07/2021.  Did receive doxycycline in June.  No other antibiotic exposure.  Has follow-up with Dr. [name] later today.  No prior similar symptoms.  Otherwise his usual state of health prior to 10/7.    - WBC 10.6, H/H 13.3/39, PLT 282, MCV 105 (stable from previous), RDW 14 - NA 132, K 5.2, BUN/creatinine 12/0.5 - ESR 72, CRP 17.9 (ULN 20) - GI PCR panel and calprotectin collected yesterday.  Results pending - No abdominal imaging for review    Endoscopic History: - 04/24/2010: Colonoscopy: 2 cm polyp in the ascending colon resected with submucosal injection and hot snare (path HP).  2 polyps in the descending colon measuring 5-10 mm removed with cold snare (path TA).  Multiple subcentimeter polyps in the sigmoid colon (path HP).  Moderate left-sided diverticulosis and single diverticulum in ascending colon.  Recommended repeat in 1 year -11/12/2011: Colonoscopy: 3 mm ascending polyp removed with cold snare (path benign).  Mild diverticulosis in descending and transverse colon.  Recommended repeat in 10 years    Past Medical History:  Diagnosis Date   Adult ADHD    Allergic rhinitis    Anxiety    claustrophobic   Colon  polyps 2012 and 2013   All hyperplastic except one adenomatous w/out high grade dysplasia (recall 2023 per Dr. Deatra Ina)   COVID-19 virus infection 09/29/2020   Elevated transaminase level 2017   Viral hep screening neg.  Suspect fatty liver.   Hyperlipidemia    Hypertension    Obesity    OSA on CPAP    Severe.  Wears CPAP but pt does not know his setting.  (Dr. 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 [name] to do surg   Pneumonia    Seborrheic dermatitis of scalp    and face: hydrocort 2% cream bid prn to face, and fluocinonide 0.5% sol'n to scalp bid prn per Dr. Allyson Sabal (04/2014)   Secondary male hypogonadism 04/2021   Type 2 diabetes mellitus with microalbuminuria ([place]) DM dx-09/27/2015. Microalb 2020     Past Surgical History:  Procedure Laterality Date   COLONOSCOPY W/ POLYPECTOMY  04/2010; 10/2011   Initial screening TCS showed 6 polyps (one adenomatous).  Pt says he returned for repeat TCS 10/2011 showed one hyperplastic polyp: Recall 10 yrs per Dr. Deatra Ina   INCISION AND DRAINAGE ABSCESS N/A 11/07/2021   Procedure: Incision and drainage of scrotal abscess;  Surgeon: Leighton Ruff, MD;  Location: [facility];  Service: General;  Laterality: N/A;   MOUTH SURGERY     Family History  Problem Relation Age of Onset   Lung cancer Father  Cancer Father        nasal   Allergies Father    Colon cancer Neg Hx    Esophageal cancer Neg Hx    Rectal cancer Neg Hx    Stomach cancer Neg Hx    Social History   Tobacco Use   Smoking status: Former    Packs/day: 1.00    Types: Cigarettes    Quit date: 07/17/2002    Years since quitting: 19.5   Smokeless tobacco: Never  Vaping Use   Vaping Use: Never used  Substance Use Topics   Alcohol use: Yes    Alcohol/week: 7.0 standard drinks of alcohol    Types: 7 Standard drinks or equivalent per week    Comment: wine every night with dinner   Drug use: No   Current Outpatient Medications  Medication Sig  Dispense Refill   Accu-Chek Softclix Lancets lancets SMARTSIG:Topical     albuterol (VENTOLIN HFA) 108 (90 Base) MCG/ACT inhaler Inhale 1-2 puffs into the lungs every 4 (four) hours as needed for wheezing or shortness of breath.     atorvastatin (LIPITOR) 40 MG tablet TAKE 1 TABLET BY MOUTH EVERY DAY 30 tablet 0   diphenoxylate-atropine (LOMOTIL) 2.5-0.025 MG tablet Take 1-2 tablets by mouth 4 (four) times daily as needed for diarrhea or loose stools. 1-2 tabs po qid prn diarrhea 60 tablet 0   JARDIANCE 10 MG TABS tablet TAKE 1 TABLET BY MOUTH DAILY BEFORE BREAKFAST. 30 tablet 0   lisinopril (ZESTRIL) 10 MG tablet TAKE 1 TABLET DAILY 90 tablet 0   metFORMIN (GLUCOPHAGE) 500 MG tablet TAKE 1 TABLET BY MOUTH 2 TIMES DAILY WITH A MEAL. 60 tablet 0   methylphenidate (METADATE CD) 20 MG CR capsule Take 1 capsule (20 mg total) by mouth daily as needed. 30 capsule 0   SYRINGE-NEEDLE, DISP, 3 ML 22G X 1-1/2" 3 ML MISC USE TO ADMINISTER TESTOSTERONE INJECTION EVERY 2 WEEKS 50 each 1   Syringe/Needle, Disp, 18G X 1" 3 ML MISC USE TO ADMINISTER TESTOSTERONE INJECTION EVERY 2 WEEKS 50 each 1   testosterone cypionate (DEPOTESTOSTERONE CYPIONATE) 200 MG/ML injection 200 MG (1 ML) SQ Q 2 WEEKS 5 mL 1   traMADol (ULTRAM) 50 MG tablet Take 1-2 tablets (50-100 mg total) by mouth every 6 (six) hours as needed. 30 tablet 0   glucose blood (ACCU-CHEK GUIDE) test strip USE TO TEST BLOOD SUGAR TWICE DAILY (Patient not taking: Reported on 08/30/2021) 200 strip 0   mupirocin ointment (BACTROBAN) 2 % 1 application twice a day to each nostril, each armpit, both groin creases, anal crease, and perineum for 7 days Repeat this process in 1 month (Patient not taking: Reported on 10/29/2021) 30 g 3   terbinafine (LAMISIL) 250 MG tablet Take 1 tablet (250 mg total) by mouth daily. 1 tab po qd x 14d (Patient not taking: Reported on 10/29/2021) 14 tablet 0   No current facility-administered medications for this visit.   Allergies   Allergen Reactions   Lobster [Shellfish Allergy] Nausea Only   Poison Ivy Extract Itching     Review of Systems: All systems reviewed and negative except where noted in HPI.     Physical Exam:    Wt Readings from Last 3 Encounters:  01/22/22 243 lb (110.2 kg)  11/07/21 251 lb 1.7 oz (113.9 kg)  11/01/21 251 lb (113.9 kg)    BP 122/82   Pulse (!) 124   Ht _0  (1.778 m)   Wt 243 lb (110.2  kg)   BMI 34.87 kg/m  Constitutional:  Pleasant, in no acute distress. Psychiatric: Normal mood and affect. Behavior is normal. Cardiovascular: Normal rate, regular rhythm. No edema Pulmonary/chest: Effort normal and breath sounds normal. No wheezing, rales or rhonchi. Abdominal: Soft, nondistended, nontender. Bowel sounds active throughout. There are no masses palpable. No hepatomegaly. Neurological: Alert and oriented to person place and time. Skin: Skin is warm and dry. No rashes noted.   ASSESSMENT AND PLAN;   1) Diarrhea 2) Fecal Urgency 3) Fecal leakage/seepage 4) Change in bowel habits  Abrupt onset change in bowel habits with watery, nonbloody diarrhea, fecal urgency, fecal seepage.  Potentially related to antibiotic exposure back in June.  Not sure if he received additional antibiotics during his scrotal abscess surgery in August.  Mildly elevated WBC, elevated ESR with otherwise normal CRP.  Discussed additional etiologies with plan as follows: - Follow-up on pending stool studies - If stool studies unremarkable, plan for expedited colonoscopy with random and directed biopsies - If stool studies unremarkable for infectious etiology, ok to resume Imodium and Lomotil - Continue adequate hydration  5) History of colon polyps - Would be due for repeat colonoscopy this year for polyp surveillance.  Timing pending above evaluation.    Lavena Bullion, DO, FACG  01/22/2022, 10:19 AM   McGowen, Adrian Blackwater, MD

## 2022-01-22 NOTE — Patient Instructions (Addendum)
If you are age 65 or older, your body mass index should be between 23-30. Your Body mass index is 34.87 kg/m. If this is out of the aforementioned range listed, please consider follow up with your Primary Care Provider. __________________________________________________________  The [facility] providers would like to encourage you to use [facility] to communicate with providers for non-urgent requests or questions.  Due to long hold times on the telephone, sending your provider a message by [facility] may be a faster and more efficient way to get a response.  Please allow 48 business hours for a response.  Please remember that this is for non-urgent requests.   Due to recent changes in healthcare laws, you may see the results of your imaging and laboratory studies on MyChart before your provider has had a chance to review them.  We understand that in some cases there may be results that are confusing or concerning to you. Not all laboratory results come back in the same time frame and the provider may be waiting for multiple results in order to interpret others.  Please give [place] 48 hours in order for your provider to thoroughly review all the results before contacting the office for clarification of your results.    Thank you for choosing me and [facility].  Vito Cirigliano, D.O.

## 2022-01-23 ENCOUNTER — Ambulatory Visit: Payer: 59 | Admitting: Family Medicine

## 2022-01-23 ENCOUNTER — Encounter: Payer: Self-pay | Admitting: Family Medicine

## 2022-01-23 VITALS — BP 129/80 | HR 113 | Temp 98.7°F | Ht 70.0 in | Wt 241.0 lb

## 2022-01-23 DIAGNOSIS — R197 Diarrhea, unspecified: Secondary | ICD-10-CM | POA: Diagnosis not present

## 2022-01-23 DIAGNOSIS — I10 Essential (primary) hypertension: Secondary | ICD-10-CM | POA: Diagnosis not present

## 2022-01-23 DIAGNOSIS — Z23 Encounter for immunization: Secondary | ICD-10-CM | POA: Diagnosis not present

## 2022-01-23 DIAGNOSIS — E119 Type 2 diabetes mellitus without complications: Secondary | ICD-10-CM | POA: Diagnosis not present

## 2022-01-23 DIAGNOSIS — F900 Attention-deficit hyperactivity disorder, predominantly inattentive type: Secondary | ICD-10-CM | POA: Diagnosis not present

## 2022-01-23 LAB — GI PROFILE, STOOL, PCR

## 2022-01-23 LAB — POCT GLYCOSYLATED HEMOGLOBIN (HGB A1C)
HbA1c POC (<> result, manual entry): 6.6 % (ref 4.0–5.6)
HbA1c, POC (controlled diabetic range): 6.6 % (ref 0.0–7.0)
HbA1c, POC (prediabetic range): 6.6 % — AB (ref 5.7–6.4)
Hemoglobin A1C: 6.6 % — AB (ref 4.0–5.6)

## 2022-01-23 MED ORDER — CLOTRIMAZOLE-BETAMETHASONE 1-0.05 % EX CREA: 1.0000 | TOPICAL_CREAM | Freq: Two times a day (BID) | CUTANEOUS | 1 refills | Status: DC

## 2022-01-23 NOTE — Progress Notes (Addendum)
OFFICE VISIT  01/23/2022  CC:  Chief Complaint  Patient presents with   Diabetes   Hypertension    Patient is a 66 y.o. male who presents for 26-monthfollow-up diabetes is, hypertension, and adult ADD. A/P as of last visit: "1 diabetes type 2, control is pretty good but worsened since being off Jardiance POC Hba1c today is 7.2%. Restart Jardiance 10 mg a day.  Continue metformin 500 mg twice daily.   #2 hypertension, well controlled on lisinopril 10 mg a day. Electrolytes and creatinine today.  3.  Adult ADD.  Long-term stability on Metadate CD 20 milligrams daily.  A new prescription was not needed for this today.   #4 secondary male hypogonadism.  Good response to testosterone 200 mg IM every 2 weeks. He has been a month or so since he had an injection.  I recommend he restarts this.   #5 erectile dysfunction.  Okay to try online advertised Viagra preparations.   #6 bilateral lower extremity edema/venous insufficiency. Reassured. Emphasized importance of low-sodium diet and periodic elevation.  No diuretics at this time.   #7 recurrent perineal abscess. Surgery planned for next month   #8 hearing impairment.  His wife complained that he cannot hear but he says he has no concern about his hearing.  He simply states he is focused/distracte. Audiogram here today showed adequate hearing at the 500, 1000, and 2000 Hz ranges but the 4000 Hz range is required 55 dB bilat for him to respond.  Consistent with presbycusis, no treatment needed at this time"  INTERIM HX: Since I last saw him he got incision and drainage of his scrotal abscess on 11/07/2021. He is nearly 100% recovered.  Diarrhea started 12/22/2021, many loose stools per day. He got evaluated by GI yesterday and the plan is for stool evaluation and most likely colonoscopy for biopsies.  No home glucose or blood pressure monitoring. Pt states all is going well with the med at current dosing-> methylphenidate CD20  milligrams a day: much improved focus, concentration, task completion.  Less frustration, better multitasking, less impulsivity and restlessness.  Mood is stable. No side effects from the medication.  PMP AWARE reviewed today: most recent rx methylphenidate CD was filled 01/16/2022, #30, rx by me. No red flags.  For about a week now he has had an itchy patch of skin under left breast region.  A little bit of blood-tinged fluid noted the last few days.    ROS as above, plus--> no fevers, no CP, no SOB, no wheezing, no cough, no dizziness, no HAs,  no melena/hematochezia.  No polyuria or polydipsia.  No myalgias or arthralgias.  No focal weakness, paresthesias, or tremors.  No acute vision or hearing abnormalities.  No dysuria or unusual/new urinary urgency or frequency.  No recent changes in lower legs. No n/v/d or abd pain.  No palpitations.    Past Medical History:  Diagnosis Date   Adult ADHD    Allergic rhinitis    Anxiety    claustrophobic   Colon polyps 2012 and 2013   All hyperplastic except one adenomatous w/out high grade dysplasia (recall 2023 per Dr. KDeatra Ina   COVID-19 virus infection 09/29/2020   Elevated transaminase level 2017   Viral hep screening neg.  Suspect fatty liver.   Hyperlipidemia    Hypertension    Obesity    OSA on CPAP    Severe.  Wears CPAP but pt does not know his setting.  (Dr. WMelvyn Novasfollows him)  Perineal abscess    2022/2023, recurrent-->fistula->Dr. [name] to do surg   Pneumonia    Seborrheic dermatitis of scalp    and face: hydrocort 2% cream bid prn to face, and fluocinonide 0.5% sol'n to scalp bid prn per Dr. Allyson Sabal (04/2014)   Secondary male hypogonadism 04/2021   Type 2 diabetes mellitus with microalbuminuria ([place]) DM dx-09/27/2015. Microalb 2020    Past Surgical History:  Procedure Laterality Date   COLONOSCOPY W/ POLYPECTOMY  04/2010; 10/2011   Initial screening TCS showed 6 polyps (one adenomatous).  Pt says he returned for repeat TCS 10/2011  showed one hyperplastic polyp: Recall 10 yrs per Dr. Deatra Ina   INCISION AND DRAINAGE ABSCESS N/A 11/07/2021   Procedure: Incision and drainage of scrotal abscess;  Surgeon: Leighton Ruff, MD;  Location: [facility];  Service: General;  Laterality: N/A;   MOUTH SURGERY      Outpatient Medications Prior to Visit  Medication Sig Dispense Refill   Accu-Chek Softclix Lancets lancets SMARTSIG:Topical     atorvastatin (LIPITOR) 40 MG tablet TAKE 1 TABLET BY MOUTH EVERY DAY 30 tablet 0   diphenoxylate-atropine (LOMOTIL) 2.5-0.025 MG tablet Take 1-2 tablets by mouth 4 (four) times daily as needed for diarrhea or loose stools. 1-2 tabs po qid prn diarrhea 60 tablet 0   JARDIANCE 10 MG TABS tablet TAKE 1 TABLET BY MOUTH DAILY BEFORE BREAKFAST. 30 tablet 0   lisinopril (ZESTRIL) 10 MG tablet TAKE 1 TABLET DAILY 90 tablet 0   metFORMIN (GLUCOPHAGE) 500 MG tablet TAKE 1 TABLET BY MOUTH 2 TIMES DAILY WITH A MEAL. 60 tablet 0   methylphenidate (METADATE CD) 20 MG CR capsule Take 1 capsule (20 mg total) by mouth daily as needed. 30 capsule 0   SYRINGE-NEEDLE, DISP, 3 ML 22G X 1-1/2" 3 ML MISC USE TO ADMINISTER TESTOSTERONE INJECTION EVERY 2 WEEKS 50 each 1   Syringe/Needle, Disp, 18G X 1" 3 ML MISC USE TO ADMINISTER TESTOSTERONE INJECTION EVERY 2 WEEKS 50 each 1   testosterone cypionate (DEPOTESTOSTERONE CYPIONATE) 200 MG/ML injection 200 MG (1 ML) SQ Q 2 WEEKS 5 mL 1   traMADol (ULTRAM) 50 MG tablet Take 1-2 tablets (50-100 mg total) by mouth every 6 (six) hours as needed. 30 tablet 0   albuterol (VENTOLIN HFA) 108 (90 Base) MCG/ACT inhaler Inhale 1-2 puffs into the lungs every 4 (four) hours as needed for wheezing or shortness of breath. (Patient not taking: Reported on 01/23/2022)     glucose blood (ACCU-CHEK GUIDE) test strip USE TO TEST BLOOD SUGAR TWICE DAILY (Patient not taking: Reported on 08/30/2021) 200 strip 0   mupirocin ointment (BACTROBAN) 2 % 1 application twice a day to each nostril, each armpit, both  groin creases, anal crease, and perineum for 7 days Repeat this process in 1 month (Patient not taking: Reported on 10/29/2021) 30 g 3   terbinafine (LAMISIL) 250 MG tablet Take 1 tablet (250 mg total) by mouth daily. 1 tab po qd x 14d (Patient not taking: Reported on 10/29/2021) 14 tablet 0   No facility-administered medications prior to visit.    Allergies  Allergen Reactions   Lobster [Shellfish Allergy] Nausea Only   Poison Ivy Extract Itching    ROS As per HPI  PE:    01/23/2022    1:04 PM 01/22/2022   10:07 AM 11/07/2021    9:04 AM  Vitals with BMI  Height '5\' 10"'$  '5\' 10"'$    Weight 241 lbs 243 lbs   BMI 34.58 34.87  Systolic 161 096 045  Diastolic 80 82 72  Pulse 409 124 70   Physical Exam  Wt Readings from Last 2 Encounters:  01/23/22 241 lb (109.3 kg)  01/22/22 243 lb (110.2 kg)    Gen: alert, oriented x 4, affect pleasant.  Lucid thinking and conversation noted. HEENT: PERRLA, EOMI.   Neck: no LAD, mass, or thyromegaly. CV: RRR, no m/r/g LUNGS: CTA bilat, nonlabored. NEURO: no tremor or tics noted on observation.  Coordination intact. CN 2-12 grossly intact bilaterally, strength 5/5 in all extremeties.  No ataxia. Skin: Left upper chest wall with small approximately 10 cm x 4 cm area of pinkish/hyperpigmented macerated skin between skin folds.  Small area in the center with superficial ulcer.  No nodularity or fluctuance. The area of rash well-demarcated.  LABS:  Last CBC Lab Results  Component Value Date   WBC 10.6 (H) 01/18/2022   HGB 13.3 01/18/2022   HCT 39.2 01/18/2022   MCV 105.5 (H) 01/18/2022   MCH 36.4 (H) 11/01/2021   RDW 14.1 01/18/2022   PLT 282.0 81/19/1478   Last metabolic panel Lab Results  Component Value Date   GLUCOSE 108 (H) 01/18/2022   NA 132 (L) 01/18/2022   K 5.2 No hemolysis seen (H) 01/18/2022   CL 95 (L) 01/18/2022   CO2 26 01/18/2022   BUN 12 01/18/2022   CREATININE 0.52 01/18/2022   CALCIUM 9.0 01/18/2022   PROT 7.3  07/12/2021   ALBUMIN 4.5 07/12/2021   BILITOT 0.7 07/12/2021   ALKPHOS 90 07/12/2021   AST 33 07/12/2021   ALT 29 07/12/2021   Last lipids Lab Results  Component Value Date   CHOL 109 07/12/2021   HDL 54.60 07/12/2021   LDLCALC 34 07/12/2021   LDLDIRECT 139.9 06/13/2011   TRIG 105.0 07/12/2021   CHOLHDL 2 07/12/2021   Last hemoglobin A1c Lab Results  Component Value Date   HGBA1C 6.6 (A) 01/23/2022   HGBA1C 6.6 01/23/2022   HGBA1C 6.6 (A) 01/23/2022   HGBA1C 6.6 01/23/2022   Lab Results  Component Value Date   TESTOSTERONE 504 06/14/2021   IMPRESSION AND PLAN:  #1 diabetes with microalbuminuria.  Improved control. POC A1c today is 6.6% Continue Jardiance 10 mg a day and metformin 500 mg twice a day.  #2 hypertension, well controlled on lisinopril 10 mg a day. Electrolytes and kidney function stable 5 days ago.  #3 adult ADD. Doing well long-term on methylphenidate CD20 milligrams once daily.  #4 secondary male hypogonadism.  Good response to testosterone 200 mg IM every 2 weeks in the past. He has not had any injections in the last several months but does plan to restart this soon.  #5 scrotal abscess, doing great status post incision and drainage a couple of months ago.  #6 intertrigo, left upper chest wall. Lotrisone cream prescribed. Also recommended over-the-counter zinc oxide ointment.  #7 diarrhea, he is status post GI evaluation yesterday. Stool studies to be done. Possible colonoscopy if stool studies negative.  An After Visit Summary was printed and given to the patient.  FOLLOW UP: Return in about 3 months (around 04/25/2022) for routine chronic illness f/u. Next CPE 06/2022  Signed:  Crissie Sickles, MD           01/23/2022

## 2022-01-24 ENCOUNTER — Telehealth: Payer: Self-pay

## 2022-01-24 NOTE — Telephone Encounter (Signed)
Received call from pt. Pt saw lab results in mychart and was requesting further recommendations. Let pt know that it looks like we are still waiting on the results of the fecal calprotectin. Also let pt know that Dr. [name] is out of the office today and tomorrow but once he reviews the results we would contact him with recommendations.

## 2022-01-28 ENCOUNTER — Other Ambulatory Visit: Payer: Self-pay

## 2022-01-28 DIAGNOSIS — Z860100 Personal history of colon polyps, unspecified: Secondary | ICD-10-CM

## 2022-01-28 DIAGNOSIS — R197 Diarrhea, unspecified: Secondary | ICD-10-CM

## 2022-01-28 DIAGNOSIS — R152 Fecal urgency: Secondary | ICD-10-CM

## 2022-01-28 DIAGNOSIS — R194 Change in bowel habit: Secondary | ICD-10-CM

## 2022-01-28 LAB — CALPROTECTIN, FECAL: Calprotectin, Fecal: 3570 ug/g — ABNORMAL HIGH (ref 0–120)

## 2022-01-28 MED ORDER — NA SULFATE-K SULFATE-MG SULF 17.5-3.13-1.6 GM/177ML PO SOLN: 1.0000 | Freq: Once | ORAL | 0 refills | Status: AC

## 2022-01-28 NOTE — Telephone Encounter (Signed)
See separate message, but significantly elevated fecal calprotectin of 3570.  GI PCR panel negative for infection.  Plan for colonoscopy with me. Ok to [place] into 7:30 slot if no current availability.

## 2022-01-28 NOTE — Telephone Encounter (Signed)
Patient calling to get stool test results.

## 2022-01-29 NOTE — Telephone Encounter (Signed)
Noted. See 01/21/22 stool test result note.

## 2022-01-31 ENCOUNTER — Encounter: Payer: Self-pay | Admitting: Family Medicine

## 2022-02-01 ENCOUNTER — Other Ambulatory Visit: Payer: Self-pay | Admitting: Family Medicine

## 2022-02-04 ENCOUNTER — Other Ambulatory Visit: Payer: Self-pay

## 2022-02-04 ENCOUNTER — Ambulatory Visit (AMBULATORY_SURGERY_CENTER): Payer: 59 | Admitting: Gastroenterology

## 2022-02-04 ENCOUNTER — Encounter: Payer: Self-pay | Admitting: Gastroenterology

## 2022-02-04 ENCOUNTER — Other Ambulatory Visit (INDEPENDENT_AMBULATORY_CARE_PROVIDER_SITE_OTHER): Payer: 59

## 2022-02-04 VITALS — BP 118/67 | HR 99 | Temp 99.1°F | Resp 18 | Ht 70.0 in | Wt 243.0 lb

## 2022-02-04 DIAGNOSIS — K633 Ulcer of intestine: Secondary | ICD-10-CM | POA: Diagnosis not present

## 2022-02-04 DIAGNOSIS — K529 Noninfective gastroenteritis and colitis, unspecified: Secondary | ICD-10-CM

## 2022-02-04 DIAGNOSIS — D62 Acute posthemorrhagic anemia: Secondary | ICD-10-CM | POA: Diagnosis not present

## 2022-02-04 DIAGNOSIS — K921 Melena: Secondary | ICD-10-CM

## 2022-02-04 DIAGNOSIS — K5289 Other specified noninfective gastroenteritis and colitis: Secondary | ICD-10-CM

## 2022-02-04 DIAGNOSIS — R194 Change in bowel habit: Secondary | ICD-10-CM | POA: Diagnosis not present

## 2022-02-04 DIAGNOSIS — R197 Diarrhea, unspecified: Secondary | ICD-10-CM

## 2022-02-04 DIAGNOSIS — Z86010 Personal history of colonic polyps: Secondary | ICD-10-CM

## 2022-02-04 DIAGNOSIS — K50119 Crohn's disease of large intestine with unspecified complications: Secondary | ICD-10-CM

## 2022-02-04 DIAGNOSIS — K6289 Other specified diseases of anus and rectum: Secondary | ICD-10-CM

## 2022-02-04 LAB — CBC WITH DIFFERENTIAL/PLATELET
Basophils Absolute: 0 10*3/uL (ref 0.0–0.1)
Basophils Relative: 0.2 % (ref 0.0–3.0)
Eosinophils Absolute: 0 10*3/uL (ref 0.0–0.7)
Eosinophils Relative: 0.1 % (ref 0.0–5.0)
HCT: 28.8 % — ABNORMAL LOW (ref 39.0–52.0)
Hemoglobin: 9.9 g/dL — ABNORMAL LOW (ref 13.0–17.0)
Lymphocytes Relative: 9 % — ABNORMAL LOW (ref 12.0–46.0)
Lymphs Abs: 0.4 10*3/uL — ABNORMAL LOW (ref 0.7–4.0)
MCHC: 34.3 g/dL (ref 30.0–36.0)
MCV: 102.4 fl — ABNORMAL HIGH (ref 78.0–100.0)
Monocytes Absolute: 0.9 10*3/uL (ref 0.1–1.0)
Monocytes Relative: 19.9 % — ABNORMAL HIGH (ref 3.0–12.0)
Neutro Abs: 3.1 10*3/uL (ref 1.4–7.7)
Neutrophils Relative %: 70.8 % (ref 43.0–77.0)
Platelets: 263 10*3/uL (ref 150.0–400.0)
RBC: 2.81 Mil/uL — ABNORMAL LOW (ref 4.22–5.81)
RDW: 13.8 % (ref 11.5–15.5)
WBC: 4.4 10*3/uL (ref 4.0–10.5)

## 2022-02-04 MED ORDER — SODIUM CHLORIDE 0.9 % IV SOLN: 500.0000 mL | Freq: Once | INTRAVENOUS | Status: DC

## 2022-02-04 MED ORDER — PREDNISONE 10 MG PO TABS: 40.0000 mg | ORAL_TABLET | Freq: Every day | ORAL | 0 refills | Status: DC

## 2022-02-04 NOTE — Progress Notes (Signed)
Pt resting comfortably. VSS. Airway intact. SBAR complete to RN. All questions answered.   

## 2022-02-04 NOTE — Progress Notes (Signed)
Called to room to assist during endoscopic procedure.  Patient ID and intended procedure confirmed with present staff. Received instructions for my participation in the procedure from the performing physician.  

## 2022-02-04 NOTE — Progress Notes (Signed)
Pt has been having bloody stools frequently  since his office visit. He is dizzy and weak this am. B 128/76, HR 110 and regular O2 sat 97%. Dr. [name] made aware and at bedside.

## 2022-02-04 NOTE — Progress Notes (Signed)
RN attempted to make f/u appointment w/ Dr. [name] for 3 weeks out, no availability until January 2024.  MD was notified and states he will have his office nurse, Mickel Baas, work patient into his schedule.

## 2022-02-04 NOTE — Progress Notes (Signed)
Per Dr. [name], pt needs a CBC prior to procedure.  Lab notified and stat CBC order put in.  Lab will come up to admitting [facility] as soon as they can

## 2022-02-04 NOTE — Op Note (Signed)
Richard [name] Patient Name: Richard [name] Procedure Date: 02/04/2022 10:22 AM MRN: [id number] Endoscopist: Gerrit Heck , MD, [id number] Age: 66 Referring MD:  Date of Birth: 1956/01/01 Gender: Male Account #: [id number] Procedure:                Colonoscopy Indications:              Hematochezia, Change in bowel habits, Diarrhea,                            Acute blood loss anemia                           Elevated CRP and fecal calprotectin with                            normal/negative GI PCR panel Medicines:                Monitored Anesthesia Care Procedure:                Pre-Anesthesia Assessment:                           - Prior to the procedure, a History and Physical                            was performed, and patient medications and                            allergies were reviewed. The patient's tolerance of                            previous anesthesia was also reviewed. The risks                            and benefits of the procedure and the sedation                            options and risks were discussed with the patient.                            All questions were answered, and informed consent                            was obtained. Prior Anticoagulants: The patient has                            taken no anticoagulant or antiplatelet agents. ASA                            Grade Assessment: III - A patient with severe                            systemic disease. After reviewing the risks and  benefits, the patient was deemed in satisfactory                            condition to undergo the procedure.                           After obtaining informed consent, the colonoscope                            was passed under direct vision. Throughout the                            procedure, the patient's blood pressure, pulse, and                            oxygen saturations were monitored continuously. The                             CF HQ190L #[id number] was introduced through the anus                            and advanced to the 5 cm into the ileum. The CF                            HQ190L #[id number] was introduced through the and                            advanced to the. The colonoscopy was performed                            without difficulty. The patient tolerated the                            procedure well. The quality of the bowel                            preparation was fair, but adequate for the purposes                            of this study. The terminal ileum, ileocecal valve,                            appendiceal orifice, and rectum were photographed. Scope In: 12:11:38 PM Scope Out: 12:35:28 PM Scope Withdrawal Time: 0 hours 17 minutes 53 seconds  Total Procedure Duration: 0 hours 23 minutes 50 seconds  Findings:                 An anal fissure was found on perianal exam.                           Diffuse severe inflammation characterized by                            congestion (edema), erythema, and deep  ulcerations                            was found in the entire colon. There was mucosal                            friability. Biopsies were taken with a cold forceps                            for histology. Estimated blood loss was minimal.                           Retroflexion in the rectum was not performed due to                            the extensive mucosal edema. Anterograde views of                            the rectum were notable for erythematous mucosa,                            but no ulcers in the rectum.                           The terminal ileum appeared normal. Biopsies were                            taken with a cold forceps for histology. Estimated                            blood loss was minimal. Complications:            No immediate complications. Estimated Blood Loss:     Estimated blood loss was minimal. Impression:               - Preparation of  the colon was fair, but adequate                            for the purposes of this study.                           - Anal fissure found on perianal exam.                           - Diffuse severe inflammation was found in the                            entire examined colon secondary to Crohn's disease                            with colonic involvement. Biopsied.                           - The examined portion of the ileum was normal.  Biopsied. Recommendation:           - Patient has a contact number available for                            emergencies. The signs and symptoms of potential                            delayed complications were discussed with the                            patient. Return to normal activities tomorrow.                            Written discharge instructions were provided to the                            patient.                           - Resume previous diet.                           - Continue present medications.                           - Await pathology results.                           - Use prednisone 40 mg PO once a day for 2 weeks,                            then slow taper by 5 mg every 7 days.                           - Check quantiferon gold, TPMT, Hepatitis BsAb,                            Hepatitis BsAg in anticipation of starting biologic                            therapy/                           - Check CT Enterography to evaluate for additional                            small bowel disease and possible fistulizing                            disease given depth of cratered ulcers and history                            of scrotal abscess.                           -  Return to GI clinic in 3 weeks. Gerrit Heck, MD 02/04/2022 12:45:41 PM

## 2022-02-04 NOTE — Patient Instructions (Signed)
Await pathology results.  A RX for Prednisone has been sent to your pharmacy.  Take '40mg'$  once a day for 2 weeks, then slow taper by '5mg'$  every 7 days.  Please call Dr. Creta Levin office and ask for his nurse to schedule an appt w/ him in 3 weeks.  She will also order the CT enteropgraphy.  YOU HAD AN ENDOSCOPIC PROCEDURE TODAY AT [facility]:   Refer to the procedure report that was given to you for any specific questions about what was found during the examination.  If the procedure report does not answer your questions, please call your gastroenterologist to clarify.  If you requested that your care partner not be given the details of your procedure findings, then the procedure report has been included in a sealed envelope for you to review at your convenience later.  YOU SHOULD EXPECT: Some feelings of bloating in the abdomen. Passage of more gas than usual.  Walking can help get rid of the air that was put into your GI tract during the procedure and reduce the bloating. If you had a lower endoscopy (such as a colonoscopy or flexible sigmoidoscopy) you may notice spotting of blood in your stool or on the toilet paper. If you underwent a bowel prep for your procedure, you may not have a normal bowel movement for a few days.  Please Note:  You might notice some irritation and congestion in your nose or some drainage.  This is from the oxygen used during your procedure.  There is no need for concern and it should clear up in a day or so.  SYMPTOMS TO REPORT IMMEDIATELY:  Following lower endoscopy (colonoscopy or flexible sigmoidoscopy):  Excessive amounts of blood in the stool  Significant tenderness or worsening of abdominal pains  Swelling of the abdomen that is new, acute  Fever of 100F or higher  For urgent or emergent issues, a gastroenterologist can be reached at any hour by calling [phone]. Do not use MyChart messaging for urgent concerns.    DIET:  We do  recommend a small meal at first, but then you may proceed to your regular diet.  Drink plenty of fluids but you should avoid alcoholic beverages for 24 hours.  ACTIVITY:  You should plan to take it easy for the rest of today and you should NOT DRIVE or use heavy machinery until tomorrow (because of the sedation medicines used during the test).    FOLLOW UP: Our staff will call the number listed on your records the next business day following your procedure.  We will call around 7:15- 8:00 am to check on you and address any questions or concerns that you may have regarding the information given to you following your procedure. If we do not reach you, we will leave a message.     If any biopsies were taken you will be contacted by phone or by letter within the next 1-3 weeks.  Please call us at [phone] if you have not heard about the biopsies in 3 weeks.    SIGNATURES/CONFIDENTIALITY: You and/or your care partner have signed paperwork which will be entered into your electronic medical record.  These signatures attest to the fact that that the information above on your After Visit Summary has been reviewed and is understood.  Full responsibility of the confidentiality of this discharge information lies with you and/or your care-partner.

## 2022-02-04 NOTE — Progress Notes (Signed)
GASTROENTEROLOGY PROCEDURE H&P NOTE   Primary Care Physician: [name], MD    Reason for Procedure:  Diarrhea, hematochezia, change in bowel habits, elevated inflammatory markers  Plan:    Colonoscopy  Patient is appropriate for endoscopic procedure(s) in the ambulatory ([place]) setting.  The nature of the procedure, as well as the risks, benefits, and alternatives were carefully and thoroughly reviewed with the patient. Ample time for discussion and questions allowed. The patient understood, was satisfied, and agreed to proceed.     HPI: Richard [name] is a 66 y.o. male who presents for colonoscopy for evaluation of relatively acute onset diarrhea.  He was seen in the office by me on 01/22/2022 with c/o essentially acute onset nonbloody diarrhea, fecal urgency, and intermittent fecal leakage (please refer to that note for full details).  Evaluation notable for elevated ESR (72), elevated fecal calprotectin (> 3500), and WBC 10.6.  GI PCR panel was negative for infection.  Over the last week, has developed bloody stools which have been progressively worsening.  Completed bowel prep last night with watery and bloody stools.  Does report having increasing fatigue, lightheadedness,  DOE over the last couple of days, but no chest pain, syncope, falls.  Per staff discussion with his wife today, she reports that she has been away for the last week or so caring for her family member in [place], and when she returned noticed a decline in his overall state of health.  In preoperative check-in, BP 120s over 70s, HR low 110s.  Started IV fluids and elected for stat CBC prior to proceeding with any procedure.  Stat CBC today with H/H 9.9/28.8, WBC 4.4, PLT 263.  Comparison lab from 01/18/2022 with WBC 10.6, H/H 13.3/39.2.  Endoscopic History: - 04/24/2010: Colonoscopy: 2 cm polyp in the ascending colon resected with submucosal injection and hot snare (path HP).  2 polyps in the descending colon  measuring 5-10 mm removed with cold snare (path TA).  Multiple subcentimeter polyps in the sigmoid colon (path HP).  Moderate left-sided diverticulosis and single diverticulum in ascending colon.  Recommended repeat in 1 year -11/12/2011: Colonoscopy: 3 mm ascending polyp removed with cold snare (path benign).  Mild diverticulosis in descending and transverse colon.  Recommended repeat in 10 years   Past Medical History:  Diagnosis Date   Adult ADHD    Allergic rhinitis    Anxiety    claustrophobic   Colon polyps 2012 and 2013   All hyperplastic except one adenomatous w/out high grade dysplasia (recall 2023 per Dr. Deatra Ina)   COVID-19 virus infection 09/29/2020   Elevated transaminase level 2017   Viral hep screening neg.  Suspect fatty liver.   Hyperlipidemia    Hypertension    Obesity    OSA on CPAP    Severe.  Wears CPAP but pt does not know his setting.  (Dr. 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 [name] to do surg   Pneumonia    Seborrheic dermatitis of scalp    and face: hydrocort 2% cream bid prn to face, and fluocinonide 0.5% sol'n to scalp bid prn per Dr. Allyson Sabal (04/2014)   Secondary male hypogonadism 04/2021   Type 2 diabetes mellitus with microalbuminuria ([place]) DM dx-09/27/2015. Microalb 2020    Past Surgical History:  Procedure Laterality Date   COLONOSCOPY W/ POLYPECTOMY  04/2010; 10/2011   Initial screening TCS showed 6 polyps (one adenomatous).  Pt says he returned for repeat TCS 10/2011 showed  one hyperplastic polyp: Recall 10 yrs per Dr. Deatra Ina   INCISION AND DRAINAGE ABSCESS N/A 11/07/2021   Procedure: Incision and drainage of scrotal abscess;  Surgeon: Leighton Ruff, MD;  Location: [facility];  Service: General;  Laterality: N/A;   MOUTH SURGERY      Prior to Admission medications   Medication Sig Start Date End Date Taking? Authorizing Provider  Accu-Chek Softclix Lancets lancets SMARTSIG:Topical 01/19/20  Yes [provider]  atorvastatin (LIPITOR) 40 MG tablet TAKE 1 TABLET BY MOUTH EVERY DAY 02/01/22  Yes McGowen, Adrian Blackwater, MD  diphenoxylate-atropine (LOMOTIL) 2.5-0.025 MG tablet Take 1-2 tablets by mouth 4 (four) times daily as needed for diarrhea or loose stools. 1-2 tabs po qid prn diarrhea 01/12/22  Yes Pyrtle, Lajuan Lines, MD  glucose blood (ACCU-CHEK GUIDE) test strip USE TO TEST BLOOD SUGAR TWICE DAILY 07/12/21  Yes McGowen, Adrian Blackwater, MD  JARDIANCE 10 MG TABS tablet TAKE 1 TABLET BY MOUTH DAILY BEFORE BREAKFAST. 01/09/22  Yes McGowen, Adrian Blackwater, MD  lisinopril (ZESTRIL) 10 MG tablet TAKE 1 TABLET DAILY 01/10/22  Yes McGowen, Adrian Blackwater, MD  metFORMIN (GLUCOPHAGE) 500 MG tablet TAKE 1 TABLET BY MOUTH TWICE A DAY WITH FOOD 02/01/22  Yes McGowen, Adrian Blackwater, MD  SYRINGE-NEEDLE, DISP, 3 ML 22G X 1-1/2" 3 ML MISC USE TO ADMINISTER TESTOSTERONE INJECTION EVERY 2 WEEKS 06/06/21  Yes McGowen, Adrian Blackwater, MD  Syringe/Needle, Disp, 18G X 1" 3 ML MISC USE TO ADMINISTER TESTOSTERONE INJECTION EVERY 2 WEEKS 06/06/21  Yes McGowen, Adrian Blackwater, MD  albuterol (VENTOLIN HFA) 108 (90 Base) MCG/ACT inhaler Inhale 1-2 puffs into the lungs every 4 (four) hours as needed for wheezing or shortness of breath. Patient not taking: Reported on 01/23/2022 02/02/21   [provider]  clotrimazole-betamethasone (LOTRISONE) cream Apply 1 Application topically 2 (two) times daily. 01/23/22   McGowen, Adrian Blackwater, MD  methylphenidate (METADATE CD) 20 MG CR capsule Take 1 capsule (20 mg total) by mouth daily as needed. 01/16/22   McGowen, Adrian Blackwater, MD  testosterone cypionate (DEPOTESTOSTERONE CYPIONATE) 200 MG/ML injection 200 MG (1 ML) SQ Q 2 WEEKS 12/12/21   McGowen, Adrian Blackwater, MD    Current Outpatient Medications  Medication Sig Dispense Refill   Accu-Chek Softclix Lancets lancets SMARTSIG:Topical     atorvastatin (LIPITOR) 40 MG tablet TAKE 1 TABLET BY MOUTH EVERY DAY 90 tablet 1   diphenoxylate-atropine (LOMOTIL) 2.5-0.025 MG tablet Take 1-2 tablets  by mouth 4 (four) times daily as needed for diarrhea or loose stools. 1-2 tabs po qid prn diarrhea 60 tablet 0   glucose blood (ACCU-CHEK GUIDE) test strip USE TO TEST BLOOD SUGAR TWICE DAILY 200 strip 0   JARDIANCE 10 MG TABS tablet TAKE 1 TABLET BY MOUTH DAILY BEFORE BREAKFAST. 30 tablet 0   lisinopril (ZESTRIL) 10 MG tablet TAKE 1 TABLET DAILY 90 tablet 0   metFORMIN (GLUCOPHAGE) 500 MG tablet TAKE 1 TABLET BY MOUTH TWICE A DAY WITH FOOD 180 tablet 1   SYRINGE-NEEDLE, DISP, 3 ML 22G X 1-1/2" 3 ML MISC USE TO ADMINISTER TESTOSTERONE INJECTION EVERY 2 WEEKS 50 each 1   Syringe/Needle, Disp, 18G X 1" 3 ML MISC USE TO ADMINISTER TESTOSTERONE INJECTION EVERY 2 WEEKS 50 each 1   albuterol (VENTOLIN HFA) 108 (90 Base) MCG/ACT inhaler Inhale 1-2 puffs into the lungs every 4 (four) hours as needed for wheezing or shortness of breath. (Patient not taking: Reported on 01/23/2022)     clotrimazole-betamethasone (LOTRISONE) cream Apply 1 Application  topically 2 (two) times daily. 30 g 1   methylphenidate (METADATE CD) 20 MG CR capsule Take 1 capsule (20 mg total) by mouth daily as needed. 30 capsule 0   testosterone cypionate (DEPOTESTOSTERONE CYPIONATE) 200 MG/ML injection 200 MG (1 ML) SQ Q 2 WEEKS 5 mL 1   Current Facility-Administered Medications  Medication Dose Route Frequency Provider Last Rate Last Admin   0.9 %  sodium chloride infusion  500 mL Intravenous Once Jaquelyn Sakamoto V, DO        Allergies as of 02/04/2022 - Review Complete 02/04/2022  Allergen Reaction Noted   Lobster [shellfish allergy] Nausea Only 09/04/2018   Poison ivy extract Itching 09/04/2018    Family History  Problem Relation Age of Onset   Lung cancer Father    Cancer Father        nasal   Allergies Father    Colon cancer Neg Hx    Esophageal cancer Neg Hx    Rectal cancer Neg Hx    Stomach cancer Neg Hx     Social History   Socioeconomic History   Marital status: Married    Spouse name: Not on file    Number of children: Not on file   Years of education: Not on file   Highest education level: Not on file  Occupational History   Occupation: Counselling psychologist  Tobacco Use   Smoking status: Former    Packs/day: 1.00    Types: Cigarettes    Quit date: 07/17/2002    Years since quitting: 19.5   Smokeless tobacco: Never  Vaping Use   Vaping Use: Never used  Substance and Sexual Activity   Alcohol use: Yes    Alcohol/week: 7.0 standard drinks of alcohol    Types: 7 Standard drinks or equivalent per week    Comment: wine every night with dinner   Drug use: No   Sexual activity: Not on file  Other Topics Concern   Not on file  Social History Narrative   Married, 1 child--grown.   Occupation: Financial planner"   Tobacco: 20 pack-yr hx; quit 2004.   Alcohol: nightly cocktail   Drugs: none   Exercise: "walk some when I can".   Diet: normal.   Right Handed. Used to be left handed   Three story home   Drinks a lot of tea and occasional coffee   Social Determinants of Health   Financial Resource Strain: Unknown (10/11/2021)   Overall Financial Resource Strain (CARDIA)    Difficulty of Paying Living Expenses: Patient refused  Food Insecurity: Unknown (10/11/2021)   Hunger Vital Sign    Worried About Running Out of Food in the Last Year: Patient refused    [place] in the Last Year: Patient refused  Transportation Needs: Unknown (10/11/2021)   [place] - Hydrologist (Medical): Patient refused    Lack of Transportation (Non-Medical): Patient refused  Physical Activity: Unknown (10/11/2021)   Exercise Vital Sign    Days of Exercise per Week: Patient refused    Minutes of Exercise per Session: Not on file  Stress: No Stress Concern Present (10/11/2021)   [place] of Stress : Not at all  Social Connections: Unknown (10/11/2021)   Social Connection and Isolation Panel  [NHANES]    Frequency of Communication with Friends and Family: Patient refused    Frequency of Social Gatherings with Friends and Family: Patient refused  Attends Religious Services: Patient refused    Active Member of Clubs or Organizations: Patient refused    Attends Archivist Meetings: Not on file    Marital Status: Patient refused  Intimate Partner Violence: Not on file    Physical Exam: Vital signs in last 24 hours: _0  128/72   Pulse (!) 115   Temp 99.1 F (37.3 C)   Ht _1  (1.778 m)   Wt 243 lb (110.2 kg)   SpO2 94%   BMI 34.87 kg/m  GEN: NAD EYE: Sclerae anicteric ENT: MMM CV: Non-tachycardic Pulm: CTA b/l GI: Soft, NT/ND NEURO:  Alert & Oriented x 3   Gerrit Heck, DO [facility]   02/04/2022 10:29 AM

## 2022-02-05 ENCOUNTER — Other Ambulatory Visit: Payer: Self-pay | Admitting: Family Medicine

## 2022-02-05 ENCOUNTER — Telehealth: Payer: Self-pay | Admitting: *Deleted

## 2022-02-05 NOTE — Telephone Encounter (Signed)
Attempted to call patient for their post-procedure follow-up call. No answer. Left voicemail.   

## 2022-02-06 ENCOUNTER — Telehealth: Payer: Self-pay | Admitting: Gastroenterology

## 2022-02-06 ENCOUNTER — Other Ambulatory Visit: Payer: Self-pay

## 2022-02-06 DIAGNOSIS — R197 Diarrhea, unspecified: Secondary | ICD-10-CM

## 2022-02-06 DIAGNOSIS — R194 Change in bowel habit: Secondary | ICD-10-CM

## 2022-02-06 DIAGNOSIS — K50119 Crohn's disease of large intestine with unspecified complications: Secondary | ICD-10-CM

## 2022-02-06 NOTE — Telephone Encounter (Signed)
Spoke with pt. Pt had colonoscopy on 02/04/22. Pt reports he has had increased weakness recently. Pt reports that yesterday he started to feel weak and his legs started shaking and he had to drop to the floor. Pt states he stayed on the floor for an hour and a half before getting up. Pt also has a band of pain in his upper abdomen and he has not been able to eat much due to pain. Pt reports he is able to drink fluids and has been trying to drink enough fluids. Pt has taken prednisone for the past 3 days. Pt reports diarrhea is improving. Pt also stated he has felt a little dizzy as well.  Dr. [name] patient. Dr. Fuller Plan as DOD please advise.

## 2022-02-06 NOTE — Telephone Encounter (Signed)
Order placed for CT enterography as requested on 11/20 colonoscopy report. Message sent to radiology scheduling for scheduling purposes. Pt also scheduled for follow up on 02/20/22 at 10:00 am. Called pt and left voicemail for pt to return call.

## 2022-02-06 NOTE — Telephone Encounter (Signed)
Spoke with pt and gave pt recommendations. Pt verbalized understanding.

## 2022-02-06 NOTE — Telephone Encounter (Signed)
Recently diagnosed Crohn's colitis, started prednisone. ED evaluation for weakness, abdominal pain, possible inadequate po intake.

## 2022-02-06 NOTE — Telephone Encounter (Signed)
Inbound call from patient stating that he had a colonoscopy on 11/20 and stated that he had some questions and concerns. Requesting a call back to discuss. Please advise.

## 2022-02-07 ENCOUNTER — Emergency Department (HOSPITAL_COMMUNITY): Payer: 59

## 2022-02-07 ENCOUNTER — Inpatient Hospital Stay (HOSPITAL_COMMUNITY)
Admission: EM | Admit: 2022-02-07 | Discharge: 2022-03-01 | DRG: 853 | Disposition: A | Discharge status: Rehabilitation Facility | Payer: 59 | Attending: Internal Medicine | Admitting: Internal Medicine

## 2022-02-07 ENCOUNTER — Encounter (HOSPITAL_COMMUNITY)
Admission: EM | Disposition: A | Discharge status: Rehabilitation Facility | Payer: Self-pay | Source: Home / Self Care | Attending: Internal Medicine

## 2022-02-07 ENCOUNTER — Other Ambulatory Visit: Payer: Self-pay

## 2022-02-07 DIAGNOSIS — E8770 Fluid overload, unspecified: Secondary | ICD-10-CM | POA: Diagnosis present

## 2022-02-07 DIAGNOSIS — Z794 Long term (current) use of insulin: Secondary | ICD-10-CM | POA: Diagnosis present

## 2022-02-07 DIAGNOSIS — R7612 Nonspecific reaction to cell mediated immunity measurement of gamma interferon antigen response without active tuberculosis: Secondary | ICD-10-CM | POA: Diagnosis present

## 2022-02-07 DIAGNOSIS — Z9989 Dependence on other enabling machines and devices: Secondary | ICD-10-CM

## 2022-02-07 DIAGNOSIS — E873 Alkalosis: Secondary | ICD-10-CM | POA: Diagnosis present

## 2022-02-07 DIAGNOSIS — Z8616 Personal history of COVID-19: Secondary | ICD-10-CM

## 2022-02-07 DIAGNOSIS — R0689 Other abnormalities of breathing: Secondary | ICD-10-CM | POA: Diagnosis not present

## 2022-02-07 DIAGNOSIS — J9811 Atelectasis: Secondary | ICD-10-CM | POA: Diagnosis present

## 2022-02-07 DIAGNOSIS — I10 Essential (primary) hypertension: Secondary | ICD-10-CM | POA: Diagnosis present

## 2022-02-07 DIAGNOSIS — R2689 Other abnormalities of gait and mobility: Secondary | ICD-10-CM | POA: Diagnosis present

## 2022-02-07 DIAGNOSIS — R198 Other specified symptoms and signs involving the digestive system and abdomen: Secondary | ICD-10-CM

## 2022-02-07 DIAGNOSIS — R933 Abnormal findings on diagnostic imaging of other parts of digestive tract: Secondary | ICD-10-CM

## 2022-02-07 DIAGNOSIS — Z79899 Other long term (current) drug therapy: Secondary | ICD-10-CM

## 2022-02-07 DIAGNOSIS — R21 Rash and other nonspecific skin eruption: Secondary | ICD-10-CM | POA: Insufficient documentation

## 2022-02-07 DIAGNOSIS — K529 Noninfective gastroenteritis and colitis, unspecified: Secondary | ICD-10-CM | POA: Diagnosis present

## 2022-02-07 DIAGNOSIS — J90 Pleural effusion, not elsewhere classified: Secondary | ICD-10-CM | POA: Diagnosis present

## 2022-02-07 DIAGNOSIS — E860 Dehydration: Secondary | ICD-10-CM | POA: Diagnosis present

## 2022-02-07 DIAGNOSIS — K922 Gastrointestinal hemorrhage, unspecified: Secondary | ICD-10-CM | POA: Diagnosis present

## 2022-02-07 DIAGNOSIS — K631 Perforation of intestine (nontraumatic): Secondary | ICD-10-CM | POA: Diagnosis present

## 2022-02-07 DIAGNOSIS — L539 Erythematous condition, unspecified: Secondary | ICD-10-CM | POA: Diagnosis present

## 2022-02-07 DIAGNOSIS — G4733 Obstructive sleep apnea (adult) (pediatric): Secondary | ICD-10-CM | POA: Diagnosis present

## 2022-02-07 DIAGNOSIS — R030 Elevated blood-pressure reading, without diagnosis of hypertension: Secondary | ICD-10-CM | POA: Diagnosis present

## 2022-02-07 DIAGNOSIS — K626 Ulcer of anus and rectum: Secondary | ICD-10-CM

## 2022-02-07 DIAGNOSIS — R188 Other ascites: Secondary | ICD-10-CM | POA: Diagnosis present

## 2022-02-07 DIAGNOSIS — K5790 Diverticulosis of intestine, part unspecified, without perforation or abscess without bleeding: Secondary | ICD-10-CM | POA: Diagnosis present

## 2022-02-07 DIAGNOSIS — E875 Hyperkalemia: Secondary | ICD-10-CM | POA: Diagnosis present

## 2022-02-07 DIAGNOSIS — Z933 Colostomy status: Secondary | ICD-10-CM

## 2022-02-07 DIAGNOSIS — A419 Sepsis, unspecified organism: Principal | ICD-10-CM | POA: Diagnosis present

## 2022-02-07 DIAGNOSIS — K641 Second degree hemorrhoids: Secondary | ICD-10-CM | POA: Diagnosis present

## 2022-02-07 DIAGNOSIS — F909 Attention-deficit hyperactivity disorder, unspecified type: Secondary | ICD-10-CM | POA: Diagnosis present

## 2022-02-07 DIAGNOSIS — K6389 Other specified diseases of intestine: Secondary | ICD-10-CM | POA: Diagnosis present

## 2022-02-07 DIAGNOSIS — Z6837 Body mass index (BMI) 37.0-37.9, adult: Secondary | ICD-10-CM

## 2022-02-07 DIAGNOSIS — Z7952 Long term (current) use of systemic steroids: Secondary | ICD-10-CM

## 2022-02-07 DIAGNOSIS — K50111 Crohn's disease of large intestine with rectal bleeding: Secondary | ICD-10-CM | POA: Diagnosis present

## 2022-02-07 DIAGNOSIS — E1129 Type 2 diabetes mellitus with other diabetic kidney complication: Secondary | ICD-10-CM | POA: Diagnosis present

## 2022-02-07 DIAGNOSIS — E43 Unspecified severe protein-calorie malnutrition: Secondary | ICD-10-CM | POA: Diagnosis not present

## 2022-02-07 DIAGNOSIS — I872 Venous insufficiency (chronic) (peripheral): Secondary | ICD-10-CM | POA: Diagnosis present

## 2022-02-07 DIAGNOSIS — E785 Hyperlipidemia, unspecified: Secondary | ICD-10-CM | POA: Diagnosis present

## 2022-02-07 DIAGNOSIS — Z1152 Encounter for screening for COVID-19: Secondary | ICD-10-CM

## 2022-02-07 DIAGNOSIS — Z7984 Long term (current) use of oral hypoglycemic drugs: Secondary | ICD-10-CM

## 2022-02-07 DIAGNOSIS — E876 Hypokalemia: Secondary | ICD-10-CM | POA: Diagnosis present

## 2022-02-07 DIAGNOSIS — K635 Polyp of colon: Secondary | ICD-10-CM | POA: Diagnosis present

## 2022-02-07 DIAGNOSIS — K651 Peritoneal abscess: Secondary | ICD-10-CM | POA: Diagnosis present

## 2022-02-07 DIAGNOSIS — E8809 Other disorders of plasma-protein metabolism, not elsewhere classified: Secondary | ICD-10-CM | POA: Diagnosis present

## 2022-02-07 DIAGNOSIS — Z91013 Allergy to seafood: Secondary | ICD-10-CM

## 2022-02-07 DIAGNOSIS — E861 Hypovolemia: Secondary | ICD-10-CM | POA: Diagnosis present

## 2022-02-07 DIAGNOSIS — H9190 Unspecified hearing loss, unspecified ear: Secondary | ICD-10-CM | POA: Diagnosis present

## 2022-02-07 DIAGNOSIS — G9341 Metabolic encephalopathy: Secondary | ICD-10-CM | POA: Diagnosis present

## 2022-02-07 DIAGNOSIS — E1142 Type 2 diabetes mellitus with diabetic polyneuropathy: Secondary | ICD-10-CM | POA: Diagnosis present

## 2022-02-07 DIAGNOSIS — Z79890 Hormone replacement therapy (postmenopausal): Secondary | ICD-10-CM

## 2022-02-07 DIAGNOSIS — F40240 Claustrophobia: Secondary | ICD-10-CM | POA: Diagnosis present

## 2022-02-07 DIAGNOSIS — D62 Acute posthemorrhagic anemia: Secondary | ICD-10-CM

## 2022-02-07 DIAGNOSIS — E1165 Type 2 diabetes mellitus with hyperglycemia: Secondary | ICD-10-CM | POA: Diagnosis present

## 2022-02-07 DIAGNOSIS — D539 Nutritional anemia, unspecified: Secondary | ICD-10-CM | POA: Diagnosis present

## 2022-02-07 DIAGNOSIS — Z87891 Personal history of nicotine dependence: Secondary | ICD-10-CM

## 2022-02-07 DIAGNOSIS — F419 Anxiety disorder, unspecified: Secondary | ICD-10-CM | POA: Diagnosis present

## 2022-02-07 DIAGNOSIS — D7589 Other specified diseases of blood and blood-forming organs: Secondary | ICD-10-CM | POA: Diagnosis present

## 2022-02-07 DIAGNOSIS — K567 Ileus, unspecified: Secondary | ICD-10-CM | POA: Diagnosis present

## 2022-02-07 DIAGNOSIS — K50114 Crohn's disease of large intestine with abscess: Secondary | ICD-10-CM | POA: Diagnosis present

## 2022-02-07 DIAGNOSIS — K650 Generalized (acute) peritonitis: Secondary | ICD-10-CM | POA: Diagnosis present

## 2022-02-07 DIAGNOSIS — D638 Anemia in other chronic diseases classified elsewhere: Secondary | ICD-10-CM | POA: Diagnosis present

## 2022-02-07 DIAGNOSIS — K668 Other specified disorders of peritoneum: Secondary | ICD-10-CM

## 2022-02-07 DIAGNOSIS — E291 Testicular hypofunction: Secondary | ICD-10-CM | POA: Diagnosis present

## 2022-02-07 DIAGNOSIS — K50118 Crohn's disease of large intestine with other complication: Secondary | ICD-10-CM

## 2022-02-07 DIAGNOSIS — J9589 Other postprocedural complications and disorders of respiratory system, not elsewhere classified: Secondary | ICD-10-CM | POA: Diagnosis not present

## 2022-02-07 DIAGNOSIS — K435 Parastomal hernia without obstruction or  gangrene: Secondary | ICD-10-CM | POA: Diagnosis present

## 2022-02-07 DIAGNOSIS — K625 Hemorrhage of anus and rectum: Secondary | ICD-10-CM

## 2022-02-07 DIAGNOSIS — Z86010 Personal history of colonic polyps: Secondary | ICD-10-CM

## 2022-02-07 DIAGNOSIS — K50119 Crohn's disease of large intestine with unspecified complications: Secondary | ICD-10-CM | POA: Diagnosis present

## 2022-02-07 DIAGNOSIS — K9189 Other postprocedural complications and disorders of digestive system: Secondary | ICD-10-CM | POA: Diagnosis present

## 2022-02-07 HISTORY — PX: LAPAROTOMY: SHX154

## 2022-02-07 LAB — I-STAT CHEM 8, ED
BUN: 15 mg/dL (ref 8–23)
Calcium, Ion: 1.06 mmol/L — ABNORMAL LOW (ref 1.15–1.40)
Chloride: 95 mmol/L — ABNORMAL LOW (ref 98–111)
Creatinine, Ser: 0.5 mg/dL — ABNORMAL LOW (ref 0.61–1.24)
Glucose, Bld: 155 mg/dL — ABNORMAL HIGH (ref 70–99)
HCT: 41 % (ref 39.0–52.0)
Hemoglobin: 13.9 g/dL (ref 13.0–17.0)
Potassium: 3.9 mmol/L (ref 3.5–5.1)
Sodium: 131 mmol/L — ABNORMAL LOW (ref 135–145)
TCO2: 22 mmol/L (ref 22–32)

## 2022-02-07 LAB — COMPREHENSIVE METABOLIC PANEL
ALT: 29 U/L (ref 0–44)
AST: 40 U/L (ref 15–41)
Albumin: 2.3 g/dL — ABNORMAL LOW (ref 3.5–5.0)
Alkaline Phosphatase: 87 U/L (ref 38–126)
Anion gap: 18 — ABNORMAL HIGH (ref 5–15)
BUN: 16 mg/dL (ref 8–23)
CO2: 19 mmol/L — ABNORMAL LOW (ref 22–32)
Calcium: 8.3 mg/dL — ABNORMAL LOW (ref 8.9–10.3)
Chloride: 96 mmol/L — ABNORMAL LOW (ref 98–111)
Creatinine, Ser: 0.67 mg/dL (ref 0.61–1.24)
GFR, Estimated: >60 mL/min (ref >60)
Glucose, Bld: 155 mg/dL — ABNORMAL HIGH (ref 70–99)
Potassium: 4 mmol/L (ref 3.5–5.1)
Sodium: 133 mmol/L — ABNORMAL LOW (ref 135–145)
Total Bilirubin: 1.4 mg/dL — ABNORMAL HIGH (ref 0.3–1.2)
Total Protein: 7.2 g/dL (ref 6.5–8.1)

## 2022-02-07 LAB — CBC WITH DIFFERENTIAL/PLATELET
Abs Immature Granulocytes: 0.02 10*3/uL (ref 0.00–0.07)
Basophils Absolute: 0 10*3/uL (ref 0.0–0.1)
Basophils Relative: 1 %
Eosinophils Absolute: 0 10*3/uL (ref 0.0–0.5)
Eosinophils Relative: 0 %
HCT: 38.1 % — ABNORMAL LOW (ref 39.0–52.0)
Hemoglobin: 12.9 g/dL — ABNORMAL LOW (ref 13.0–17.0)
Immature Granulocytes: 1 %
Lymphocytes Relative: 12 %
Lymphs Abs: 0.3 10*3/uL — ABNORMAL LOW (ref 0.7–4.0)
MCH: 34.4 pg — ABNORMAL HIGH (ref 26.0–34.0)
MCHC: 33.9 g/dL (ref 30.0–36.0)
MCV: 101.6 fL — ABNORMAL HIGH (ref 80.0–100.0)
Monocytes Absolute: 0.1 10*3/uL (ref 0.1–1.0)
Monocytes Relative: 6 %
Neutro Abs: 1.8 10*3/uL (ref 1.7–7.7)
Neutrophils Relative %: 80 %
Platelets: 404 10*3/uL — ABNORMAL HIGH (ref 150–400)
RBC: 3.75 MIL/uL — ABNORMAL LOW (ref 4.22–5.81)
RDW: 13.2 % (ref 11.5–15.5)
WBC: 2.2 10*3/uL — ABNORMAL LOW (ref 4.0–10.5)
nRBC: 0 % (ref 0.0–0.2)

## 2022-02-07 LAB — URINALYSIS, ROUTINE W REFLEX MICROSCOPIC
Bacteria, UA: NONE SEEN
Bilirubin Urine: NEGATIVE
Glucose, UA: >=500 mg/dL — AB
Ketones, ur: 20 mg/dL — AB
Leukocytes,Ua: NEGATIVE
Nitrite: NEGATIVE
Protein, ur: 30 mg/dL — AB
Specific Gravity, Urine: 1.036 — ABNORMAL HIGH (ref 1.005–1.030)
pH: 6 (ref 5.0–8.0)

## 2022-02-07 LAB — RESP PANEL BY RT-PCR (FLU A&B, COVID) ARPGX2
Influenza A by PCR: NEGATIVE
Influenza B by PCR: NEGATIVE
SARS Coronavirus 2 by RT PCR: NEGATIVE

## 2022-02-07 LAB — LACTIC ACID, PLASMA
Lactic Acid, Venous: 2.3 mmol/L (ref 0.5–1.9)
Lactic Acid, Venous: 1.3 mmol/L (ref 0.5–1.9)

## 2022-02-07 LAB — TYPE AND SCREEN
ABO/RH(D): O NEG
Antibody Screen: NEGATIVE

## 2022-02-07 LAB — TROPONIN I (HIGH SENSITIVITY)
Troponin I (High Sensitivity): 10 ng/L (ref <18)
Troponin I (High Sensitivity): 14 ng/L (ref <18)

## 2022-02-07 LAB — APTT: aPTT: 30 seconds (ref 24–36)

## 2022-02-07 LAB — PROTIME-INR
INR: 1.3 — ABNORMAL HIGH (ref 0.8–1.2)
Prothrombin Time: 15.8 seconds — ABNORMAL HIGH (ref 11.4–15.2)

## 2022-02-07 LAB — CK: Total CK: 231 U/L (ref 49–397)

## 2022-02-07 LAB — D-DIMER, QUANTITATIVE: D-Dimer, Quant: 11.05 ug/mL-FEU — ABNORMAL HIGH (ref 0.00–0.50)

## 2022-02-07 LAB — CBG MONITORING, ED: Glucose-Capillary: 145 mg/dL — ABNORMAL HIGH (ref 70–99)

## 2022-02-07 SURGERY — LAPAROTOMY, EXPLORATORY
Anesthesia: General | Site: Abdomen

## 2022-02-07 MED ORDER — SODIUM CHLORIDE 0.9 % IV SOLN
2.0000 g | Freq: Once | INTRAVENOUS | Status: AC
  Administered 2022-02-07: 2 g via INTRAVENOUS
  Filled 2022-02-07: qty 12.5

## 2022-02-07 MED ORDER — PROPOFOL 10 MG/ML IV BOLUS
INTRAVENOUS | Status: AC
  Filled 2022-02-07: qty 20

## 2022-02-07 MED ORDER — LACTATED RINGERS IV BOLUS (SEPSIS)
1000.0000 mL | Freq: Once | INTRAVENOUS | Status: AC
  Administered 2022-02-07: 1000 mL via INTRAVENOUS

## 2022-02-07 MED ORDER — LACTATED RINGERS IV BOLUS
1000.0000 mL | Freq: Once | INTRAVENOUS | Status: AC
  Administered 2022-02-07: 1000 mL via INTRAVENOUS

## 2022-02-07 MED ORDER — IOHEXOL 350 MG/ML SOLN
100.0000 mL | Freq: Once | INTRAVENOUS | Status: AC | PRN
  Administered 2022-02-07: 100 mL via INTRAVENOUS

## 2022-02-07 MED ORDER — ACETAMINOPHEN 500 MG PO TABS
1000.0000 mg | ORAL_TABLET | Freq: Once | ORAL | Status: AC
  Administered 2022-02-07: 1000 mg via ORAL
  Filled 2022-02-07: qty 2

## 2022-02-07 MED ORDER — METRONIDAZOLE 500 MG/100ML IV SOLN
500.0000 mg | Freq: Once | INTRAVENOUS | Status: AC
  Administered 2022-02-07: 500 mg via INTRAVENOUS
  Filled 2022-02-07: qty 100

## 2022-02-07 MED ORDER — LACTATED RINGERS IV SOLN: INTRAVENOUS | Status: AC

## 2022-02-07 MED ORDER — VANCOMYCIN HCL IN DEXTROSE 1-5 GM/200ML-% IV SOLN
1000.0000 mg | Freq: Once | INTRAVENOUS | Status: AC
  Administered 2022-02-07: 1000 mg via INTRAVENOUS
  Filled 2022-02-07: qty 200

## 2022-02-07 MED ORDER — ONDANSETRON HCL 4 MG/2ML IJ SOLN
4.0000 mg | Freq: Once | INTRAMUSCULAR | Status: AC
  Administered 2022-02-07: 4 mg via INTRAVENOUS
  Filled 2022-02-07: qty 2

## 2022-02-07 MED ORDER — FENTANYL CITRATE PF 50 MCG/ML IJ SOSY
50.0000 ug | PREFILLED_SYRINGE | Freq: Once | INTRAMUSCULAR | Status: AC
  Administered 2022-02-07: 50 ug via INTRAVENOUS
  Filled 2022-02-07: qty 1

## 2022-02-07 SURGICAL SUPPLY — 50 items
APL PRP STRL LF DISP 70% ISPRP (MISCELLANEOUS) ×2
BAG COUNTER SPONGE SURGICOUNT (BAG) IMPLANT
BAG SPNG CNTER NS LX DISP (BAG)
BLADE EXTENDED COATED 6.5IN (ELECTRODE) IMPLANT
CELLS DAT CNTRL 66122 CELL SVR (MISCELLANEOUS) IMPLANT
CHLORAPREP W/TINT 26 (MISCELLANEOUS) ×1 IMPLANT
DRAIN CHANNEL 19F RND (DRAIN) IMPLANT
DRAPE LAPAROSCOPIC ABDOMINAL (DRAPES) ×1 IMPLANT
DRAPE SHEET LG 3/4 BI-LAMINATE (DRAPES) IMPLANT
DRSG OPSITE POSTOP 4X10 (GAUZE/BANDAGES/DRESSINGS) IMPLANT
DRSG OPSITE POSTOP 4X6 (GAUZE/BANDAGES/DRESSINGS) IMPLANT
DRSG OPSITE POSTOP 4X8 (GAUZE/BANDAGES/DRESSINGS) IMPLANT
ELECT REM PT RETURN 15FT ADLT (MISCELLANEOUS) ×1 IMPLANT
EVACUATOR SILICONE 100CC (DRAIN) IMPLANT
GAUZE PAD ABD 8X10 STRL (GAUZE/BANDAGES/DRESSINGS) IMPLANT
GAUZE SPONGE 4X4 12PLY STRL (GAUZE/BANDAGES/DRESSINGS) IMPLANT
GLOVE BIO SURGEON STRL SZ 6.5 (GLOVE) ×2 IMPLANT
GLOVE BIOGEL PI IND STRL 7.0 (GLOVE) ×2 IMPLANT
GLOVE INDICATOR 6.5 STRL GRN (GLOVE) ×1 IMPLANT
GOWN STRL REUS W/ TWL XL LVL3 (GOWN DISPOSABLE) ×3 IMPLANT
GOWN STRL REUS W/TWL XL LVL3 (GOWN DISPOSABLE) ×3
HANDLE SUCTION POOLE (INSTRUMENTS) IMPLANT
KIT TURNOVER KIT A (KITS) IMPLANT
LEGGING LITHOTOMY PAIR STRL (DRAPES) IMPLANT
LIGASURE IMPACT 36 18CM CVD LR (INSTRUMENTS) IMPLANT
PACK COLON (CUSTOM PROCEDURE TRAY) ×1 IMPLANT
PENCIL SMOKE EVACUATOR (MISCELLANEOUS) IMPLANT
RELOAD PROXIMATE 75MM BLUE (ENDOMECHANICALS) ×1 IMPLANT
RELOAD STAPLE 75 3.8 BLU REG (ENDOMECHANICALS) IMPLANT
RETRACTOR WND ALEXIS 18 MED (MISCELLANEOUS) IMPLANT
RETRACTOR WND ALEXIS 25 LRG (MISCELLANEOUS) IMPLANT
RTRCTR WOUND ALEXIS 18CM MED (MISCELLANEOUS)
RTRCTR WOUND ALEXIS 25CM LRG (MISCELLANEOUS)
STAPLER PROXIMATE 75MM BLUE (STAPLE) IMPLANT
STAPLER VISISTAT 35W (STAPLE) ×1 IMPLANT
SUCTION POOLE HANDLE (INSTRUMENTS) ×1
SUT ETHILON 3 0 PS 1 (SUTURE) IMPLANT
SUT NOVA 1 T20/GS 25DT (SUTURE) ×2 IMPLANT
SUT PDS AB 0 CTX 36 PDP370T (SUTURE) IMPLANT
SUT SILK 2 0 (SUTURE) ×1
SUT SILK 2 0 SH CR/8 (SUTURE) ×1 IMPLANT
SUT SILK 2-0 18XBRD TIE 12 (SUTURE) ×1 IMPLANT
SUT SILK 3 0 (SUTURE) ×1
SUT SILK 3 0 SH CR/8 (SUTURE) ×1 IMPLANT
SUT SILK 3-0 18XBRD TIE 12 (SUTURE) ×1 IMPLANT
SUT VIC AB 2-0 SH 18 (SUTURE) ×1 IMPLANT
TOWEL OR 17X26 10 PK STRL BLUE (TOWEL DISPOSABLE) IMPLANT
TOWEL OR NON WOVEN STRL DISP B (DISPOSABLE) ×1 IMPLANT
TRAY FOLEY MTR SLVR 16FR STAT (SET/KITS/TRAYS/PACK) ×1 IMPLANT
TUBING CONNECTING 10 (TUBING) ×2 IMPLANT

## 2022-02-07 NOTE — Sepsis Progress Note (Signed)
Elink monitoring for the code sepsis protocol.  

## 2022-02-07 NOTE — ED Provider Notes (Signed)
[facility] DEPT Provider Note   CSN: [id number] Arrival date & time: 02/07/22  2000     History  Chief Complaint  Patient presents with   Fall   Respiratory Distress    Richard [name] is a 66 y.o. male.  Level 5 Appreciated condition.  Patient with a history of hypertension, diabetes, recent diagnosis of probable Crohn's disease after colonoscopy 3 days ago presenting with loss of consciousness, dizziness, lightheadedness, abdominal pain.  Today he had an episode of near syncope falling to his knees but did not lose consciousness did not hit his head.  Has had lightheadedness, dizziness, difficulty breathing and difficulty walking for the past several days worsening since his colonoscopy.  He is tachycardic, tachypneic and febrile on arrival.  Denies chest pain.  States he been having upper abdominal pain for the past several weeks which is worsened since his colonoscopy.  Has had rectal bleeding and bloody stools prior to his colonoscopy but this is improving.  Denies blood thinner use. Does not think he hit his head.  Complains of pain to his abdomen and lower chest wall.  Denies any head, neck, back pain.  Wife at bedside reports he is not been eating or drinking very well for several days.  She was here earlier with a different patient did not realize that this patient was so ill.  The history is provided by the patient and a relative. The history is limited by the condition of the patient.  Fall Associated symptoms include abdominal pain. Pertinent negatives include no shortness of breath.       Home Medications Prior to Admission medications   Medication Sig Start Date End Date Taking? Authorizing Provider  Accu-Chek Softclix Lancets lancets SMARTSIG:Topical 01/19/20   [provider]  albuterol (VENTOLIN HFA) 108 (90 Base) MCG/ACT inhaler Inhale 1-2 puffs into the lungs every 4 (four) hours as needed for wheezing or shortness of  breath. Patient not taking: Reported on 01/23/2022 02/02/21   [provider]  atorvastatin (LIPITOR) 40 MG tablet TAKE 1 TABLET BY MOUTH EVERY DAY 02/01/22   McGowen, Adrian Blackwater, MD  clotrimazole-betamethasone (LOTRISONE) cream Apply 1 Application topically 2 (two) times daily. 01/23/22   McGowen, Adrian Blackwater, MD  diphenoxylate-atropine (LOMOTIL) 2.5-0.025 MG tablet Take 1-2 tablets by mouth 4 (four) times daily as needed for diarrhea or loose stools. 1-2 tabs po qid prn diarrhea 01/12/22   Pyrtle, Lajuan Lines, MD  empagliflozin (JARDIANCE) 10 MG TABS tablet TAKE 1 TABLET BY MOUTH EVERY DAY BEFORE BREAKFAST 02/05/22   McGowen, Adrian Blackwater, MD  glucose blood (ACCU-CHEK GUIDE) test strip USE TO TEST BLOOD SUGAR TWICE DAILY 07/12/21   McGowen, Adrian Blackwater, MD  lisinopril (ZESTRIL) 10 MG tablet TAKE 1 TABLET DAILY 01/10/22   McGowen, Adrian Blackwater, MD  metFORMIN (GLUCOPHAGE) 500 MG tablet TAKE 1 TABLET BY MOUTH TWICE A DAY WITH FOOD 02/01/22   McGowen, Adrian Blackwater, MD  methylphenidate (METADATE CD) 20 MG CR capsule Take 1 capsule (20 mg total) by mouth daily as needed. 01/16/22   McGowen, Adrian Blackwater, MD  predniSONE (DELTASONE) 10 MG tablet Take 4 tablets (40 mg total) by mouth daily with breakfast for 14 days. After 14 days, then slow taper by '5mg'$  every 7 days. 02/04/22 02/18/22  Cirigliano, Vito V, DO  SYRINGE-NEEDLE, DISP, 3 ML 22G X 1-1/2" 3 ML MISC USE TO ADMINISTER TESTOSTERONE INJECTION EVERY 2 WEEKS 06/06/21   McGowen, Adrian Blackwater, MD  Syringe/Needle, Disp, 18G X 1"  3 ML MISC USE TO ADMINISTER TESTOSTERONE INJECTION EVERY 2 WEEKS 06/06/21   McGowen, Adrian Blackwater, MD  testosterone cypionate (DEPOTESTOSTERONE CYPIONATE) 200 MG/ML injection 200 MG (1 ML) SQ Q 2 WEEKS 12/12/21   McGowen, Adrian Blackwater, MD      Allergies    Lobster [shellfish allergy] and Poison ivy extract    Review of Systems   Review of Systems  Constitutional:  Positive for activity change, appetite change and fever.  Respiratory:  Negative for cough, chest  tightness and shortness of breath.   Gastrointestinal:  Positive for abdominal pain, blood in stool, diarrhea and nausea. Negative for vomiting.  Musculoskeletal:  Negative for arthralgias and myalgias.  Skin:  Negative for wound.  Neurological:  Positive for dizziness and weakness.   all other systems are negative except as noted in the HPI and PMH.    Physical Exam Updated Vital Signs BP 115/64   Pulse (!) 132   Temp (!) 102.1 F (38.9 C) (Oral)   Resp (!) 29   SpO2 95%  Physical Exam Vitals and nursing note reviewed.  Constitutional:      General: He is not in acute distress.    Appearance: He is well-developed. He is ill-appearing.     Comments: Ill-appearing, tachypneic, tachycardic  HENT:     Head: Normocephalic and atraumatic.     Mouth/Throat:     Pharynx: No oropharyngeal exudate.  Eyes:     Conjunctiva/sclera: Conjunctivae normal.     Pupils: Pupils are equal, round, and reactive to light.  Neck:     Comments: No meningismus. Cardiovascular:     Rate and Rhythm: Regular rhythm. Tachycardia present.     Heart sounds: Normal heart sounds. No murmur heard. Pulmonary:     Effort: Pulmonary effort is normal. No respiratory distress.     Breath sounds: Normal breath sounds.  Abdominal:     General: There is distension.     Palpations: Abdomen is soft.     Tenderness: There is abdominal tenderness. There is no guarding or rebound.     Comments: Large distention of abdomen, diffusely tender with guarding, worse on the right side.  Musculoskeletal:        General: No tenderness. Normal range of motion.     Cervical back: Normal range of motion and neck supple.     Right lower leg: Edema present.     Left lower leg: Edema present.  Skin:    General: Skin is warm.  Neurological:     Mental Status: He is alert and oriented to person, place, and time.     Cranial Nerves: No cranial nerve deficit.     Motor: No abnormal muscle tone.     Coordination: Coordination  normal.     Comments: No ataxia on finger to nose bilaterally. No pronator drift. 5/5 strength throughout. CN 2-12 intact.Equal grip strength. Sensation intact.   Psychiatric:        Behavior: Behavior normal.     ED Results / Procedures / Treatments   Labs (all labs ordered are listed, but only abnormal results are displayed) Labs Reviewed  LACTIC ACID, PLASMA - Abnormal; Notable for the following components:      Result Value   Lactic Acid, Venous 2.3 (*)    All other components within normal limits  COMPREHENSIVE METABOLIC PANEL - Abnormal; Notable for the following components:   Sodium 133 (*)    Chloride 96 (*)    CO2 19 (*)  Glucose, Bld 155 (*)    Calcium 8.3 (*)    Albumin 2.3 (*)    Total Bilirubin 1.4 (*)    Anion gap 18 (*)    All other components within normal limits  CBC WITH DIFFERENTIAL/PLATELET - Abnormal; Notable for the following components:   WBC 2.2 (*)    RBC 3.75 (*)    Hemoglobin 12.9 (*)    HCT 38.1 (*)    MCV 101.6 (*)    MCH 34.4 (*)    Platelets 404 (*)    Lymphs Abs 0.3 (*)    All other components within normal limits  PROTIME-INR - Abnormal; Notable for the following components:   Prothrombin Time 15.8 (*)    INR 1.3 (*)    All other components within normal limits  URINALYSIS, ROUTINE W REFLEX MICROSCOPIC - Abnormal; Notable for the following components:   Specific Gravity, Urine 1.036 (*)    Glucose, UA >=500 (*)    Hgb urine dipstick SMALL (*)    Ketones, ur 20 (*)    Protein, ur 30 (*)    All other components within normal limits  D-DIMER, QUANTITATIVE - Abnormal; Notable for the following components:   D-Dimer, Quant 11.05 (*)    All other components within normal limits  CBG MONITORING, ED - Abnormal; Notable for the following components:   Glucose-Capillary 145 (*)    All other components within normal limits  I-STAT CHEM 8, ED - Abnormal; Notable for the following components:   Sodium 131 (*)    Chloride 95 (*)     Creatinine, Ser 0.50 (*)    Glucose, Bld 155 (*)    Calcium, Ion 1.06 (*)    All other components within normal limits  RESP PANEL BY RT-PCR (FLU A&B, COVID) ARPGX2  CULTURE, BLOOD (ROUTINE X 2)  CULTURE, BLOOD (ROUTINE X 2)  URINE CULTURE  LACTIC ACID, PLASMA  APTT  CK  TYPE AND SCREEN  ABO/RH  TROPONIN I (HIGH SENSITIVITY)  TROPONIN I (HIGH SENSITIVITY)    EKG EKG Interpretation  Date/Time:  Thursday February 07 2022 20:08:37 EST Ventricular Rate:  149 PR Interval:  148 QRS Duration: 86 QT Interval:  316 QTC Calculation: 498 R Axis:   50 Text Interpretation: Sinus tachycardia Probable left atrial enlargement Low voltage, extremity and precordial leads Borderline prolonged QT interval Artifact in lead(s) I II III aVR aVL aVF and baseline wander in lead(s) V2 Rate faster Confirmed by Ezequiel Essex [phone]) on 02/07/2022 8:16:06 PM  Radiology DG Abd Portable 2 Views  Result Date: 02/07/2022 CLINICAL DATA:  sepsis EXAM: PORTABLE ABDOMEN - 2 VIEW COMPARISON:  None Available. FINDINGS: Limited evaluation due to overlapping osseous structures and overlying soft tissues. Gaseous distension of the large bowel. Question gaseous dilatation of a focal loop of small bowel within the right lower abdomen. There is no evidence of free air. No radio-opaque calculi or other significant radiographic abnormality is seen. IMPRESSION: Question gaseous dilatation of a focal loop of small bowel within the right lower abdomen. Limited evaluation due to overlapping osseous structures and overlying soft tissues. If clinically indicated, please consider CT abdomen pelvis with intravenous contrast for further evaluation. Electronically Signed   By: Iven Finn M.D.   On: 02/07/2022 21:28   DG Chest Port 1 View  Result Date: 02/07/2022 CLINICAL DATA:  Questionable sepsis - evaluate for abnormality EXAM: PORTABLE CHEST 1 VIEW COMPARISON:  Chest x-ray 06/13/2011 FINDINGS: The heart and mediastinal  contours are unchanged. Low lung  volumes with bibasilar airspace opacities likely representing atelectasis. No focal consolidation. No pulmonary edema. No pleural effusion. No pneumothorax. No acute osseous abnormality. IMPRESSION: Low lung volumes with no definite acute cardiopulmonary abnormality. Electronically Signed   By: Iven Finn M.D.   On: 02/07/2022 21:19    Procedures .Critical Care  Performed by: Ezequiel Essex, MD Authorized by: Ezequiel Essex, MD   Critical care provider statement:    Critical care time (minutes):  45   Critical care time was exclusive of:  Separately billable procedures and treating other patients   Critical care was necessary to treat or prevent imminent or life-threatening deterioration of the following conditions:  Sepsis   Critical care was time spent personally by me on the following activities:  Development of treatment plan with patient or surrogate, discussions with consultants, evaluation of patient's response to treatment, examination of patient, ordering and review of laboratory studies, ordering and review of radiographic studies, ordering and performing treatments and interventions, pulse oximetry, re-evaluation of patient's condition and review of old charts   I assumed direction of critical care for this patient from another provider in my specialty: no     Care discussed with: admitting provider       Medications Ordered in ED Medications  lactated ringers infusion (has no administration in time range)  lactated ringers bolus 1,000 mL (has no administration in time range)  ceFEPIme (MAXIPIME) 2 g in sodium chloride 0.9 % 100 mL IVPB (has no administration in time range)  metroNIDAZOLE (FLAGYL) IVPB 500 mg (has no administration in time range)  vancomycin (VANCOCIN) IVPB 1000 mg/200 mL premix (has no administration in time range)  lactated ringers bolus 1,000 mL (has no administration in time range)  acetaminophen (TYLENOL) tablet 1,000  mg (has no administration in time range)    ED Course/ Medical Decision Making/ A&P                           Medical Decision Making Amount and/or Complexity of Data Reviewed Independent Historian: spouse Labs: ordered. Decision-making details documented in ED Course. Radiology: ordered and independent interpretation performed. Decision-making details documented in ED Course. ECG/medicine tests: ordered and independent interpretation performed. Decision-making details documented in ED Course.  Risk OTC drugs. Prescription drug management. Decision regarding hospitalization.  Generalized weakness, near syncope, lightheadedness, dizziness, difficulty breathing for the past 3 days since colonoscopy.  Did not fall or hit his head.  Code sepsis activated on arrival given his fever, tachypnea, tachycardia.  Initiate broad-spectrum antibiotics and IV fluids after cultures are obtained.  Will rule out surgical complication from his colonoscopy.  Lactate mildly elevated 2.6.  Labs show leukopenia of 2.2.  Creatinine is normal.  X-rays obtained to rule out free air or bowel obstruction.  There is no obvious free air.  Results reviewed interpreted by me.  Heart rate remains elevated.  Febrile to 102.1.  CT scan obtained to evaluate for surgical pathology after his colonoscopy versus other source of infection.  Will also rule out pneumonia and pulmonary embolism given his tachycardia and tachypnea.  Will require admission to the hospital after CT scans.  Imaging is concerning for pneumoperitoneum with free fluid as well.  Results reviewed interpreted by me.  Discussed with Dr. Ardeen Garland of radiology.  He suspect perforation of transverse colon or small intestine.  Discussed with Dr. [name] of general surgery.  She will evaluate patient and recommends ICU admission.  ICU admission discussed with Dr.  Sood.  He will evaluate.  Unclear whether patient will be going to the OR tonight.  Appears to have  perforation of transverse colon versus small intestine. Blood pressure and mental status remained stable.  Patient remains tachycardic and tachypneic.  Will likely require ex lap tonight and ICU admission.        Final Clinical Impression(s) / ED Diagnoses Final diagnoses:  Sepsis with encephalopathy without septic shock, due to unspecified organism [facility])  Pneumoperitoneum    Rx / DC Orders ED Discharge Orders     None         Keoki Mchargue, Annie Main, MD 02/07/22 2333

## 2022-02-07 NOTE — Progress Notes (Signed)
A consult was received from an ED physician for vancomycin and cefepime per pharmacy dosing (for an indication other than meningitis). The patient's profile has been reviewed for ht/wt/allergies/indication/available labs. A one time order has been placed for the above antibiotics.  Further antibiotics/pharmacy consults should be ordered by admitting physician if indicated.                       Reuel Boom, PharmD, BCPS [phone] 02/07/2022, 8:32 PM

## 2022-02-07 NOTE — ED Triage Notes (Signed)
Patient coming to ED for evaluation of "something running high and it causes me to fall."  Reports he has had dizziness, difficulty breathing, increased "shaking," and has been losing his balance.  Wife reports he fall today and fire department had to assist him up.  No reports of chest pain.  States symptoms having been going on for a few days.  Unable to state chief complaint.  States "I really don't know."  Increased work of breathing noted in triage.  HR 150.  No reports of palpations.

## 2022-02-08 ENCOUNTER — Other Ambulatory Visit: Payer: Self-pay

## 2022-02-08 ENCOUNTER — Encounter (HOSPITAL_COMMUNITY): Payer: Self-pay | Admitting: Pulmonary Disease

## 2022-02-08 ENCOUNTER — Inpatient Hospital Stay (HOSPITAL_COMMUNITY): Payer: 59 | Admitting: Certified Registered Nurse Anesthetist

## 2022-02-08 DIAGNOSIS — K529 Noninfective gastroenteritis and colitis, unspecified: Secondary | ICD-10-CM | POA: Diagnosis not present

## 2022-02-08 DIAGNOSIS — K626 Ulcer of anus and rectum: Secondary | ICD-10-CM | POA: Diagnosis present

## 2022-02-08 DIAGNOSIS — K50119 Crohn's disease of large intestine with unspecified complications: Secondary | ICD-10-CM | POA: Diagnosis not present

## 2022-02-08 DIAGNOSIS — J9589 Other postprocedural complications and disorders of respiratory system, not elsewhere classified: Secondary | ICD-10-CM | POA: Diagnosis not present

## 2022-02-08 DIAGNOSIS — Z9989 Dependence on other enabling machines and devices: Secondary | ICD-10-CM | POA: Diagnosis not present

## 2022-02-08 DIAGNOSIS — K631 Perforation of intestine (nontraumatic): Secondary | ICD-10-CM | POA: Diagnosis present

## 2022-02-08 DIAGNOSIS — D8989 Other specified disorders involving the immune mechanism, not elsewhere classified: Secondary | ICD-10-CM | POA: Diagnosis not present

## 2022-02-08 DIAGNOSIS — E669 Obesity, unspecified: Secondary | ICD-10-CM | POA: Diagnosis not present

## 2022-02-08 DIAGNOSIS — K50114 Crohn's disease of large intestine with abscess: Secondary | ICD-10-CM | POA: Diagnosis not present

## 2022-02-08 DIAGNOSIS — Z1152 Encounter for screening for COVID-19: Secondary | ICD-10-CM | POA: Diagnosis not present

## 2022-02-08 DIAGNOSIS — J90 Pleural effusion, not elsewhere classified: Secondary | ICD-10-CM | POA: Diagnosis present

## 2022-02-08 DIAGNOSIS — K51018 Ulcerative (chronic) pancolitis with other complication: Secondary | ICD-10-CM | POA: Diagnosis not present

## 2022-02-08 DIAGNOSIS — T8141XA Infection following a procedure, superficial incisional surgical site, initial encounter: Secondary | ICD-10-CM | POA: Diagnosis present

## 2022-02-08 DIAGNOSIS — K921 Melena: Secondary | ICD-10-CM | POA: Diagnosis not present

## 2022-02-08 DIAGNOSIS — G9341 Metabolic encephalopathy: Secondary | ICD-10-CM | POA: Diagnosis present

## 2022-02-08 DIAGNOSIS — K50118 Crohn's disease of large intestine with other complication: Secondary | ICD-10-CM | POA: Diagnosis present

## 2022-02-08 DIAGNOSIS — K50111 Crohn's disease of large intestine with rectal bleeding: Secondary | ICD-10-CM | POA: Diagnosis present

## 2022-02-08 DIAGNOSIS — K625 Hemorrhage of anus and rectum: Secondary | ICD-10-CM | POA: Diagnosis not present

## 2022-02-08 DIAGNOSIS — J9621 Acute and chronic respiratory failure with hypoxia: Secondary | ICD-10-CM | POA: Diagnosis not present

## 2022-02-08 DIAGNOSIS — R198 Other specified symptoms and signs involving the digestive system and abdomen: Secondary | ICD-10-CM | POA: Diagnosis present

## 2022-02-08 DIAGNOSIS — R933 Abnormal findings on diagnostic imaging of other parts of digestive tract: Secondary | ICD-10-CM | POA: Diagnosis not present

## 2022-02-08 DIAGNOSIS — Z87891 Personal history of nicotine dependence: Secondary | ICD-10-CM | POA: Diagnosis not present

## 2022-02-08 DIAGNOSIS — I110 Hypertensive heart disease with heart failure: Secondary | ICD-10-CM | POA: Diagnosis present

## 2022-02-08 DIAGNOSIS — E1165 Type 2 diabetes mellitus with hyperglycemia: Secondary | ICD-10-CM | POA: Diagnosis not present

## 2022-02-08 DIAGNOSIS — E440 Moderate protein-calorie malnutrition: Secondary | ICD-10-CM | POA: Diagnosis present

## 2022-02-08 DIAGNOSIS — E1169 Type 2 diabetes mellitus with other specified complication: Secondary | ICD-10-CM | POA: Diagnosis not present

## 2022-02-08 DIAGNOSIS — G4733 Obstructive sleep apnea (adult) (pediatric): Secondary | ICD-10-CM | POA: Diagnosis not present

## 2022-02-08 DIAGNOSIS — K650 Generalized (acute) peritonitis: Secondary | ICD-10-CM | POA: Diagnosis present

## 2022-02-08 DIAGNOSIS — I10 Essential (primary) hypertension: Secondary | ICD-10-CM

## 2022-02-08 DIAGNOSIS — K50918 Crohn's disease, unspecified, with other complication: Secondary | ICD-10-CM | POA: Diagnosis not present

## 2022-02-08 DIAGNOSIS — L02211 Cutaneous abscess of abdominal wall: Secondary | ICD-10-CM | POA: Diagnosis present

## 2022-02-08 DIAGNOSIS — K922 Gastrointestinal hemorrhage, unspecified: Secondary | ICD-10-CM | POA: Diagnosis not present

## 2022-02-08 DIAGNOSIS — D638 Anemia in other chronic diseases classified elsewhere: Secondary | ICD-10-CM | POA: Diagnosis present

## 2022-02-08 DIAGNOSIS — Z8616 Personal history of COVID-19: Secondary | ICD-10-CM | POA: Diagnosis not present

## 2022-02-08 DIAGNOSIS — R5381 Other malaise: Secondary | ICD-10-CM | POA: Diagnosis present

## 2022-02-08 DIAGNOSIS — A419 Sepsis, unspecified organism: Secondary | ICD-10-CM | POA: Diagnosis present

## 2022-02-08 DIAGNOSIS — K668 Other specified disorders of peritoneum: Secondary | ICD-10-CM | POA: Diagnosis present

## 2022-02-08 DIAGNOSIS — D62 Acute posthemorrhagic anemia: Secondary | ICD-10-CM | POA: Diagnosis not present

## 2022-02-08 DIAGNOSIS — I509 Heart failure, unspecified: Secondary | ICD-10-CM | POA: Diagnosis present

## 2022-02-08 DIAGNOSIS — L02215 Cutaneous abscess of perineum: Secondary | ICD-10-CM | POA: Diagnosis present

## 2022-02-08 DIAGNOSIS — R6520 Severe sepsis without septic shock: Secondary | ICD-10-CM | POA: Diagnosis not present

## 2022-02-08 DIAGNOSIS — E873 Alkalosis: Secondary | ICD-10-CM | POA: Diagnosis present

## 2022-02-08 DIAGNOSIS — E1142 Type 2 diabetes mellitus with diabetic polyneuropathy: Secondary | ICD-10-CM | POA: Diagnosis present

## 2022-02-08 DIAGNOSIS — F909 Attention-deficit hyperactivity disorder, unspecified type: Secondary | ICD-10-CM | POA: Diagnosis not present

## 2022-02-08 DIAGNOSIS — Z933 Colostomy status: Secondary | ICD-10-CM | POA: Diagnosis not present

## 2022-02-08 DIAGNOSIS — K659 Peritonitis, unspecified: Secondary | ICD-10-CM | POA: Diagnosis not present

## 2022-02-08 DIAGNOSIS — R609 Edema, unspecified: Secondary | ICD-10-CM | POA: Diagnosis not present

## 2022-02-08 DIAGNOSIS — A4151 Sepsis due to Escherichia coli [E. coli]: Secondary | ICD-10-CM | POA: Diagnosis not present

## 2022-02-08 DIAGNOSIS — K567 Ileus, unspecified: Secondary | ICD-10-CM | POA: Diagnosis present

## 2022-02-08 DIAGNOSIS — K9189 Other postprocedural complications and disorders of digestive system: Secondary | ICD-10-CM | POA: Diagnosis present

## 2022-02-08 DIAGNOSIS — E119 Type 2 diabetes mellitus without complications: Secondary | ICD-10-CM | POA: Diagnosis present

## 2022-02-08 DIAGNOSIS — R188 Other ascites: Secondary | ICD-10-CM | POA: Diagnosis present

## 2022-02-08 DIAGNOSIS — Z9049 Acquired absence of other specified parts of digestive tract: Secondary | ICD-10-CM | POA: Diagnosis not present

## 2022-02-08 DIAGNOSIS — J9811 Atelectasis: Secondary | ICD-10-CM | POA: Diagnosis present

## 2022-02-08 DIAGNOSIS — K651 Peritoneal abscess: Secondary | ICD-10-CM | POA: Diagnosis present

## 2022-02-08 DIAGNOSIS — E43 Unspecified severe protein-calorie malnutrition: Secondary | ICD-10-CM | POA: Diagnosis present

## 2022-02-08 LAB — CBC
HCT: 31.7 % — ABNORMAL LOW (ref 39.0–52.0)
Hemoglobin: 10.5 g/dL — ABNORMAL LOW (ref 13.0–17.0)
MCH: 34.1 pg — ABNORMAL HIGH (ref 26.0–34.0)
MCHC: 33.1 g/dL (ref 30.0–36.0)
MCV: 102.9 fL — ABNORMAL HIGH (ref 80.0–100.0)
Platelets: 302 10*3/uL (ref 150–400)
RBC: 3.08 MIL/uL — ABNORMAL LOW (ref 4.22–5.81)
RDW: 13.7 % (ref 11.5–15.5)
WBC: 7 10*3/uL (ref 4.0–10.5)
nRBC: 0 % (ref 0.0–0.2)

## 2022-02-08 LAB — COMPREHENSIVE METABOLIC PANEL
ALT: 18 U/L (ref 0–44)
AST: 22 U/L (ref 15–41)
Albumin: 1.8 g/dL — ABNORMAL LOW (ref 3.5–5.0)
Alkaline Phosphatase: 44 U/L (ref 38–126)
Anion gap: 14 (ref 5–15)
BUN: 17 mg/dL (ref 8–23)
CO2: 16 mmol/L — ABNORMAL LOW (ref 22–32)
Calcium: 7.3 mg/dL — ABNORMAL LOW (ref 8.9–10.3)
Chloride: 104 mmol/L (ref 98–111)
Creatinine, Ser: 0.65 mg/dL (ref 0.61–1.24)
GFR, Estimated: >60 mL/min (ref >60)
Glucose, Bld: 158 mg/dL — ABNORMAL HIGH (ref 70–99)
Potassium: 4.4 mmol/L (ref 3.5–5.1)
Sodium: 134 mmol/L — ABNORMAL LOW (ref 135–145)
Total Bilirubin: 1.6 mg/dL — ABNORMAL HIGH (ref 0.3–1.2)
Total Protein: 4.7 g/dL — ABNORMAL LOW (ref 6.5–8.1)

## 2022-02-08 LAB — ABO/RH: ABO/RH(D): O NEG

## 2022-02-08 LAB — MRSA NEXT GEN BY PCR, NASAL: MRSA by PCR Next Gen: NOT DETECTED

## 2022-02-08 LAB — GLUCOSE, CAPILLARY
Glucose-Capillary: 137 mg/dL — ABNORMAL HIGH (ref 70–99)
Glucose-Capillary: 135 mg/dL — ABNORMAL HIGH (ref 70–99)
Glucose-Capillary: 166 mg/dL — ABNORMAL HIGH (ref 70–99)
Glucose-Capillary: 157 mg/dL — ABNORMAL HIGH (ref 70–99)

## 2022-02-08 MED ORDER — PHENYLEPHRINE 80 MCG/ML (10ML) SYRINGE FOR IV PUSH (FOR BLOOD PRESSURE SUPPORT)
PREFILLED_SYRINGE | INTRAVENOUS | Status: DC | PRN
  Administered 2022-02-08 (×2): 160 ug via INTRAVENOUS
  Administered 2022-02-08: 80 ug via INTRAVENOUS
  Administered 2022-02-08: 240 ug via INTRAVENOUS
  Administered 2022-02-08: 160 ug via INTRAVENOUS
  Administered 2022-02-08: 240 ug via INTRAVENOUS
  Administered 2022-02-08: 160 ug via INTRAVENOUS

## 2022-02-08 MED ORDER — HYDROMORPHONE HCL 1 MG/ML IJ SOLN: 0.2500 mg | INTRAMUSCULAR | Status: DC | PRN

## 2022-02-08 MED ORDER — PHENYLEPHRINE HCL (PRESSORS) 10 MG/ML IV SOLN
INTRAVENOUS | Status: AC
  Filled 2022-02-08: qty 1

## 2022-02-08 MED ORDER — PIPERACILLIN-TAZOBACTAM 3.375 G IVPB
3.3750 g | Freq: Three times a day (TID) | INTRAVENOUS | Status: DC
  Administered 2022-02-08: 3.375 g via INTRAVENOUS
  Filled 2022-02-08: qty 50

## 2022-02-08 MED ORDER — ORAL CARE MOUTH RINSE: 15.0000 mL | OROMUCOSAL | Status: DC | PRN

## 2022-02-08 MED ORDER — SUCCINYLCHOLINE CHLORIDE 200 MG/10ML IV SOSY
PREFILLED_SYRINGE | INTRAVENOUS | Status: DC | PRN
  Administered 2022-02-08: 200 mg via INTRAVENOUS

## 2022-02-08 MED ORDER — KETAMINE HCL 10 MG/ML IJ SOLN
INTRAMUSCULAR | Status: DC | PRN
  Administered 2022-02-08: 20 mg via INTRAVENOUS
  Administered 2022-02-08: 30 mg via INTRAVENOUS

## 2022-02-08 MED ORDER — ROCURONIUM BROMIDE 10 MG/ML (PF) SYRINGE
PREFILLED_SYRINGE | INTRAVENOUS | Status: AC
  Filled 2022-02-08: qty 10

## 2022-02-08 MED ORDER — HYDROMORPHONE HCL 1 MG/ML IJ SOLN
INTRAMUSCULAR | Status: DC | PRN
  Administered 2022-02-08 (×2): .5 mg via INTRAVENOUS

## 2022-02-08 MED ORDER — ONDANSETRON HCL 4 MG/2ML IJ SOLN
4.0000 mg | Freq: Four times a day (QID) | INTRAMUSCULAR | Status: DC | PRN
  Administered 2022-02-10: 4 mg via INTRAVENOUS
  Filled 2022-02-08: qty 2

## 2022-02-08 MED ORDER — LIDOCAINE HCL (PF) 2 % IJ SOLN
INTRAMUSCULAR | Status: AC
  Filled 2022-02-08: qty 5

## 2022-02-08 MED ORDER — 0.9 % SODIUM CHLORIDE (POUR BTL) OPTIME
TOPICAL | Status: DC | PRN
  Administered 2022-02-08: 3000 mL
  Administered 2022-02-08: 5000 mL

## 2022-02-08 MED ORDER — FENTANYL CITRATE (PF) 100 MCG/2ML IJ SOLN
INTRAMUSCULAR | Status: AC
  Filled 2022-02-08: qty 2

## 2022-02-08 MED ORDER — LACTATED RINGERS IV SOLN: INTRAVENOUS | Status: DC | PRN

## 2022-02-08 MED ORDER — CHLORHEXIDINE GLUCONATE CLOTH 2 % EX PADS
6.0000 | MEDICATED_PAD | Freq: Every day | CUTANEOUS | Status: DC
  Administered 2022-02-08 – 2022-02-28 (×23): 6 via TOPICAL

## 2022-02-08 MED ORDER — ONDANSETRON HCL 4 MG/2ML IJ SOLN
INTRAMUSCULAR | Status: AC
  Filled 2022-02-08: qty 2

## 2022-02-08 MED ORDER — ONDANSETRON HCL 4 MG/2ML IJ SOLN
INTRAMUSCULAR | Status: DC | PRN
  Administered 2022-02-08: 4 mg via INTRAVENOUS

## 2022-02-08 MED ORDER — PIPERACILLIN-TAZOBACTAM 3.375 G IVPB
3.3750 g | Freq: Three times a day (TID) | INTRAVENOUS | Status: DC
  Administered 2022-02-08 – 2022-02-15 (×22): 3.375 g via INTRAVENOUS
  Filled 2022-02-08 (×22): qty 50

## 2022-02-08 MED ORDER — CHLORHEXIDINE GLUCONATE 0.12 % MT SOLN
15.0000 mL | Freq: Once | OROMUCOSAL | Status: AC
  Administered 2022-02-08: 15 mL via OROMUCOSAL
  Filled 2022-02-08: qty 15

## 2022-02-08 MED ORDER — ALBUMIN HUMAN 5 % IV SOLN: INTRAVENOUS | Status: DC | PRN

## 2022-02-08 MED ORDER — CHLORHEXIDINE GLUCONATE 0.12 % MT SOLN
15.0000 mL | Freq: Once | OROMUCOSAL | Status: DC
  Filled 2022-02-08: qty 15

## 2022-02-08 MED ORDER — ORAL CARE MOUTH RINSE
15.0000 mL | OROMUCOSAL | Status: DC
  Administered 2022-02-08 – 2022-02-10 (×7): 15 mL via OROMUCOSAL

## 2022-02-08 MED ORDER — KETAMINE HCL 50 MG/5ML IJ SOSY
PREFILLED_SYRINGE | INTRAMUSCULAR | Status: AC
  Filled 2022-02-08: qty 5

## 2022-02-08 MED ORDER — HYDROMORPHONE HCL 1 MG/ML IJ SOLN
0.5000 mg | INTRAMUSCULAR | Status: DC | PRN
  Administered 2022-02-08 – 2022-02-17 (×2): 1 mg via INTRAVENOUS
  Filled 2022-02-08 (×2): qty 1

## 2022-02-08 MED ORDER — SUCCINYLCHOLINE CHLORIDE 200 MG/10ML IV SOSY
PREFILLED_SYRINGE | INTRAVENOUS | Status: AC
  Filled 2022-02-08: qty 10

## 2022-02-08 MED ORDER — FENTANYL CITRATE (PF) 100 MCG/2ML IJ SOLN
INTRAMUSCULAR | Status: DC | PRN
  Administered 2022-02-08: 100 ug via INTRAVENOUS

## 2022-02-08 MED ORDER — ALBUMIN HUMAN 5 % IV SOLN
INTRAVENOUS | Status: AC
  Filled 2022-02-08: qty 500

## 2022-02-08 MED ORDER — INSULIN ASPART 100 UNIT/ML IJ SOLN
0.0000 [IU] | INTRAMUSCULAR | Status: DC
  Administered 2022-02-08: 4 [IU] via SUBCUTANEOUS
  Administered 2022-02-08: 3 [IU] via SUBCUTANEOUS
  Administered 2022-02-08 – 2022-02-09 (×3): 4 [IU] via SUBCUTANEOUS
  Administered 2022-02-09 (×2): 3 [IU] via SUBCUTANEOUS
  Administered 2022-02-09 (×2): 4 [IU] via SUBCUTANEOUS
  Administered 2022-02-10: 3 [IU] via SUBCUTANEOUS
  Administered 2022-02-10: 11 [IU] via SUBCUTANEOUS
  Administered 2022-02-10: 4 [IU] via SUBCUTANEOUS
  Administered 2022-02-10 (×2): 3 [IU] via SUBCUTANEOUS
  Administered 2022-02-10: 4 [IU] via SUBCUTANEOUS
  Administered 2022-02-11 – 2022-02-13 (×7): 3 [IU] via SUBCUTANEOUS
  Administered 2022-02-13: 4 [IU] via SUBCUTANEOUS
  Administered 2022-02-13 (×2): 3 [IU] via SUBCUTANEOUS
  Administered 2022-02-14 (×6): 4 [IU] via SUBCUTANEOUS
  Administered 2022-02-15: 3 [IU] via SUBCUTANEOUS
  Administered 2022-02-15 – 2022-02-16 (×11): 4 [IU] via SUBCUTANEOUS
  Administered 2022-02-17 (×3): 3 [IU] via SUBCUTANEOUS
  Administered 2022-02-17 – 2022-02-18 (×2): 4 [IU] via SUBCUTANEOUS
  Administered 2022-02-18 (×2): 3 [IU] via SUBCUTANEOUS
  Administered 2022-02-18 (×3): 4 [IU] via SUBCUTANEOUS
  Administered 2022-02-18 – 2022-02-19 (×2): 3 [IU] via SUBCUTANEOUS
  Administered 2022-02-19: 4 [IU] via SUBCUTANEOUS
  Administered 2022-02-19 (×2): 3 [IU] via SUBCUTANEOUS
  Administered 2022-02-19: 4 [IU] via SUBCUTANEOUS
  Administered 2022-02-19: 3 [IU] via SUBCUTANEOUS
  Administered 2022-02-20: 4 [IU] via SUBCUTANEOUS
  Administered 2022-02-20: 3 [IU] via SUBCUTANEOUS
  Administered 2022-02-20 – 2022-02-21 (×8): 4 [IU] via SUBCUTANEOUS
  Administered 2022-02-21: 3 [IU] via SUBCUTANEOUS
  Administered 2022-02-21: 4 [IU] via SUBCUTANEOUS
  Administered 2022-02-22: 3 [IU] via SUBCUTANEOUS
  Administered 2022-02-22: 4 [IU] via SUBCUTANEOUS
  Administered 2022-02-22: 3 [IU] via SUBCUTANEOUS
  Administered 2022-02-22 (×2): 4 [IU] via SUBCUTANEOUS
  Administered 2022-02-23 (×2): 3 [IU] via SUBCUTANEOUS
  Filled 2022-02-08: qty 0.2

## 2022-02-08 MED ORDER — ROCURONIUM BROMIDE 100 MG/10ML IV SOLN
INTRAVENOUS | Status: DC | PRN
  Administered 2022-02-08: 10 mg via INTRAVENOUS
  Administered 2022-02-08: 60 mg via INTRAVENOUS

## 2022-02-08 MED ORDER — ORAL CARE MOUTH RINSE
15.0000 mL | OROMUCOSAL | Status: DC
  Administered 2022-02-08: 15 mL via OROMUCOSAL

## 2022-02-08 MED ORDER — PIPERACILLIN-TAZOBACTAM 3.375 G IVPB
INTRAVENOUS | Status: AC
  Filled 2022-02-08: qty 50

## 2022-02-08 MED ORDER — SUGAMMADEX SODIUM 200 MG/2ML IV SOLN
INTRAVENOUS | Status: DC | PRN
  Administered 2022-02-08: 200 mg via INTRAVENOUS

## 2022-02-08 MED ORDER — PROPOFOL 10 MG/ML IV BOLUS
INTRAVENOUS | Status: DC | PRN
  Administered 2022-02-08: 130 mg via INTRAVENOUS

## 2022-02-08 MED ORDER — PHENYLEPHRINE HCL-NACL 20-0.9 MG/250ML-% IV SOLN
INTRAVENOUS | Status: DC | PRN
  Administered 2022-02-08: 40 ug/min via INTRAVENOUS

## 2022-02-08 MED ORDER — LIDOCAINE HCL (CARDIAC) PF 100 MG/5ML IV SOSY
PREFILLED_SYRINGE | INTRAVENOUS | Status: DC | PRN
  Administered 2022-02-08: 80 mg via INTRAVENOUS

## 2022-02-08 MED ORDER — HYDROMORPHONE HCL 2 MG/ML IJ SOLN
INTRAMUSCULAR | Status: AC
  Filled 2022-02-08: qty 1

## 2022-02-08 NOTE — Progress Notes (Signed)
eLink Physician-Brief Progress Note Patient Name: Richard [name] DOB: 02/08/1956 MRN: [id number]   Date of Service  02/08/2022  HPI/Events of Note  41 M HTN, dyslipidemia, DM, OSA on CPAP, presented with abdominal pain. pre-syncopal episode and fever after recent colonoscopy (11/20) and diagnosed with Crohn's colitis on prednisone 40 daily. CT with perforated bowel underwent exlap with mobilization fo splenic flexure, descending and sigmoid colon resection, transverse colostomy. EBL 100 ml.  eICU Interventions  Perforated bowel, recently diagnosed crohn's colitis on steroids. Given vancomycin, cefepime and metronidazole, continued on piperacillin-tazobactam. LR at 150 cc/hr ongoing.     Intervention Category Evaluation Type: New Patient Evaluation  Judd Lien 02/08/2022, 4:56 AM

## 2022-02-08 NOTE — Consult Note (Signed)
Consultation  Referring Provider: CCM/Sood Primary Care Physician:  [name], MD Primary Gastroenterologist:  Dr. [name]  Reason for Consultation: New diagnosis of inflammatory bowel disease, acute bowel perforation  HPI: Richard [name] is a 66 y.o. male, recently established with Dr. [name] who was initially seen in the office on 01/22/2022 with complaints of diarrhea which she said had been present for about a month with at least 6-8 bowel movements per day most of which contained blood, no nocturnal episodes of diarrhea.  Over the 3 to 4 months prior to that he was having some mild diarrhea. Had been having some upper abdominal cramping but not severe and had some episodes of significant weakness at home just over the past few weeks, 1 with presyncope. After he was seen on 01/22/2022 he had stool studies done which were negative for infectious etiologies, he did have a markedly elevated sed rate at 72, and markedly elevated fecal calprotectin at 3570 .the underwent colonoscopy on 02/04/2022 with finding of a diffuse colitis with deep ulcerations throughout the entire colon and friable mucosa, on retroflex he had extensive mucosal edema in the rectum but no ulcers.  He had been started on prednisone 40 mg daily at the time of colonoscopy, path is still pending today He did develop worsening abdominal pain after the colonoscopy though they are unable to say whether he felt worse immediately after the colonoscopy or just progressively worse over the past few days.  He had an episode of presyncope at home yesterday, and had developed a fever and expressed feeling very weak over the past few days complaining of abdominal pain in the upper and lower abdomen.  He had not been eating or drinking very well over the previous few days. Temp 102 on admit, tachycardic to 130  Labs show WBC of 2.2/hemoglobin 12.9/hematocrit 38.1/platelets 404 Lactate 2.3 Blood culture pending BUN  16/creatinine 0.67 Albumin 2.3 T. bili 1.4/transaminases normal INR 1.3  CT angio of the chest was done with no evidence for pulmonary embolus CT abdomen and pelvis with findings compatible with perforated bowel with moderate free fluid and free air exact source not definitely visualized, there was a thick walled small bowel loop in the lower abdomen/upper pelvis.  He was taken to the OR last night by Dr. [name] and had exploratory lap, mobilization of the splenic flexure, descending and sigmoid colon resection and transverse colostomy.  There was a pinhole perforation identified in the proximal sigmoid colon, also noted purulent ascites throughout the abdomen and bile staining in the left lower quadrant.  Today WBC 7.0/hemoglobin 10.5/ Potassium 4.4 BUN 17/creatinine 0.65 albumin 1.8  Patient does have history of previous scrotal abscess, most recent in July 2023  He is currently alert postoperatively, says his pain level is a 3-4, just has a dull constant full feeling in his abdomen, already asking how long he will be here.    Past Medical History:  Diagnosis Date   Adult ADHD    Allergic rhinitis    Anxiety    claustrophobic   Colon polyps 2012 and 2013   All hyperplastic except one adenomatous w/out high grade dysplasia (recall 2023 per Dr. Deatra Ina)   COVID-19 virus infection 09/29/2020   Elevated transaminase level 2017   Viral hep screening neg.  Suspect fatty liver.   Hyperlipidemia    Hypertension    Obesity    OSA on CPAP    Severe.  CPAP 10 cm H2O.  Follows Dr. Ander Slade.  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 [name] to do surg   Pneumonia    Seborrheic dermatitis of scalp    and face: hydrocort 2% cream bid prn to face, and fluocinonide 0.5% sol'n to scalp bid prn per Dr. Allyson Sabal (04/2014)   Secondary male hypogonadism 04/2021   Type 2 diabetes mellitus with microalbuminuria ([place]) DM dx-09/27/2015. Microalb 2020    Past Surgical History:   Procedure Laterality Date   COLONOSCOPY W/ POLYPECTOMY  04/2010; 10/2011   Initial screening TCS showed 6 polyps (one adenomatous).  Pt says he returned for repeat TCS 10/2011 showed one hyperplastic polyp: Recall 10 yrs per Dr. Deatra Ina   INCISION AND DRAINAGE ABSCESS N/A 11/07/2021   Procedure: Incision and drainage of scrotal abscess;  Surgeon: Leighton Ruff, MD;  Location: [facility];  Service: General;  Laterality: N/A;   MOUTH SURGERY      Prior to Admission medications   Medication Sig Start Date End Date Taking? Authorizing Provider  acetaminophen (TYLENOL) 500 MG tablet Take 1,000 mg by mouth every 6 (six) hours as needed for mild pain.   Yes [provider]  atorvastatin (LIPITOR) 40 MG tablet TAKE 1 TABLET BY MOUTH EVERY DAY 02/01/22  Yes McGowen, Adrian Blackwater, MD  clotrimazole-betamethasone (LOTRISONE) cream Apply 1 Application topically 2 (two) times daily. 01/23/22  Yes McGowen, Adrian Blackwater, MD  diphenhydrAMINE (BENADRYL) 25 mg capsule Take 25 mg by mouth daily as needed for allergies.   Yes [provider]  diphenoxylate-atropine (LOMOTIL) 2.5-0.025 MG tablet Take 1-2 tablets by mouth 4 (four) times daily as needed for diarrhea or loose stools. 1-2 tabs po qid prn diarrhea 01/12/22  Yes Pyrtle, Lajuan Lines, MD  empagliflozin (JARDIANCE) 10 MG TABS tablet TAKE 1 TABLET BY MOUTH EVERY DAY BEFORE BREAKFAST Patient taking differently: Take 10 mg by mouth daily. 02/05/22  Yes McGowen, Adrian Blackwater, MD  lisinopril (ZESTRIL) 10 MG tablet TAKE 1 TABLET DAILY 01/10/22  Yes McGowen, Adrian Blackwater, MD  metFORMIN (GLUCOPHAGE) 500 MG tablet TAKE 1 TABLET BY MOUTH TWICE A DAY WITH FOOD 02/01/22  Yes McGowen, Adrian Blackwater, MD  methylphenidate (METADATE CD) 20 MG CR capsule Take 1 capsule (20 mg total) by mouth daily as needed. Patient taking differently: Take 20 mg by mouth daily as needed (for focuswhile at work). 01/16/22  Yes McGowen, Adrian Blackwater, MD  predniSONE (DELTASONE) 10 MG tablet Take 4 tablets (40 mg  total) by mouth daily with breakfast for 14 days. After 14 days, then slow taper by '5mg'$  every 7 days. 02/04/22 02/18/22 Yes Cirigliano, Vito V, DO  Accu-Chek Softclix Lancets lancets SMARTSIG:Topical 01/19/20   [provider]  glucose blood (ACCU-CHEK GUIDE) test strip USE TO TEST BLOOD SUGAR TWICE DAILY 07/12/21   McGowen, Adrian Blackwater, MD  SYRINGE-NEEDLE, DISP, 3 ML 22G X 1-1/2" 3 ML MISC USE TO ADMINISTER TESTOSTERONE INJECTION EVERY 2 WEEKS 06/06/21   McGowen, Adrian Blackwater, MD  Syringe/Needle, Disp, 18G X 1" 3 ML MISC USE TO ADMINISTER TESTOSTERONE INJECTION EVERY 2 WEEKS 06/06/21   McGowen, Adrian Blackwater, MD  testosterone cypionate (DEPOTESTOSTERONE CYPIONATE) 200 MG/ML injection 200 MG (1 ML) SQ Q 2 WEEKS Patient not taking: Reported on 02/08/2022 12/12/21   [name], MD    Current Facility-Administered Medications  Medication Dose Route Frequency Provider Last Rate Last Admin   chlorhexidine (PERIDEX) 0.12 % solution 15 mL  15 mL Mouth/Throat Once Leighton Ruff, MD       Chlorhexidine Gluconate Cloth 2 % PADS 6  each  6 each Topical Daily Chesley Mires, MD   6 each at 02/08/22 0502   HYDROmorphone (DILAUDID) injection 0.5-1 mg  0.5-1 mg Intravenous K2H PRN Leighton Ruff, MD       insulin aspart (novoLOG) injection 0-20 Units  0-20 Units Subcutaneous C6C Leighton Ruff, MD   3 Units at 02/08/22 3762   lactated ringers infusion   Intravenous Continuous Leighton Ruff, MD 831 mL/hr at 02/08/22 0824 New Bag at 02/08/22 0824   ondansetron (ZOFRAN) injection 4 mg  4 mg Intravenous D1V PRN Leighton Ruff, MD       Oral care mouth rinse  15 mL Mouth Rinse 4 times per day Chesley Mires, MD   15 mL at 02/08/22 1201   Oral care mouth rinse  15 mL Mouth Rinse PRN Chesley Mires, MD       Oral care mouth rinse  15 mL Mouth Rinse PRN Chesley Mires, MD       piperacillin-tazobactam (ZOSYN) IVPB 3.375 g  3.375 g Intravenous Q8H Chesley Mires, MD 12.5 mL/hr at 02/08/22 1201 3.375 g at 02/08/22 1201     Allergies as of 02/07/2022 - Review Complete 02/07/2022  Allergen Reaction Noted   Lobster [shellfish allergy] Nausea Only 09/04/2018   Poison ivy extract Itching 09/04/2018    Family History  Problem Relation Age of Onset   Lung cancer Father    Cancer Father        nasal   Allergies Father    Colon cancer Neg Hx    Esophageal cancer Neg Hx    Rectal cancer Neg Hx    Stomach cancer Neg Hx     Social History   Socioeconomic History   Marital status: Married    Spouse name: Not on file   Number of children: Not on file   Years of education: Not on file   Highest education level: Not on file  Occupational History   Occupation: Counselling psychologist  Tobacco Use   Smoking status: Former    Packs/day: 1.00    Types: Cigarettes    Quit date: 07/17/2002    Years since quitting: 19.5   Smokeless tobacco: Never  Vaping Use   Vaping Use: Never used  Substance and Sexual Activity   Alcohol use: Yes    Alcohol/week: 7.0 standard drinks of alcohol    Types: 7 Standard drinks or equivalent per week    Comment: wine every night with dinner   Drug use: No   Sexual activity: Yes    Birth control/protection: None  Other Topics Concern   Not on file  Social History Narrative   Married, 1 child--grown.   Occupation: Financial planner"   Tobacco: 20 pack-yr hx; quit 2004.   Alcohol: nightly cocktail   Drugs: none   Exercise: "walk some when I can".   Diet: normal.   Right Handed. Used to be left handed   Three story home   Drinks a lot of tea and occasional coffee   Social Determinants of Health   Financial Resource Strain: Unknown (10/11/2021)   Overall Financial Resource Strain (CARDIA)    Difficulty of Paying Living Expenses: Patient refused  Food Insecurity: No Food Insecurity (02/08/2022)   Hunger Vital Sign    Worried About Running Out of Food in the Last Year: Never true    Ran Out of Food in the Last Year: Never true  Transportation Needs: No Transportation  Needs (02/08/2022)   PRAPARE - Hydrologist (  Medical): No    Lack of Transportation (Non-Medical): No  Physical Activity: Unknown (10/11/2021)   Exercise Vital Sign    Days of Exercise per Week: Patient refused    Minutes of Exercise per Session: Not on file  Stress: No Stress Concern Present (10/11/2021)   [place]    Feeling of Stress : Not at all  Social Connections: Unknown (10/11/2021)   Social Connection and Isolation Panel [NHANES]    Frequency of Communication with Friends and Family: Patient refused    Frequency of Social Gatherings with Friends and Family: Patient refused    Attends Religious Services: Patient refused    Active Member of Clubs or Organizations: Patient refused    Attends Archivist Meetings: Not on file    Marital Status: Patient refused  Intimate Partner Violence: Not At Risk (02/08/2022)   Humiliation, Afraid, Rape, and Kick questionnaire    Fear of Current or Ex-Partner: No    Emotionally Abused: No    Physically Abused: No    Sexually Abused: No    Review of Systems:Pertinent positive and negative review of systems were noted in the above HPI section.  All other review of systems was otherwise negative.    Physical Exam: Vital signs in last 24 hours: Temp:  [97.4 F (36.3 C)-102.1 F (38.9 C)] 98.9 F (37.2 C) (11/24 0700) Pulse Rate:  [107-142] 123 (11/24 1100) Resp:  [11-42] 21 (11/24 1100) BP: (89-132)/(54-102) 120/69 (11/24 1100) SpO2:  [85 %-100 %] 92 % (11/24 1100) Weight:  [109.1 kg] 109.1 kg (11/24 0500) Last BM Date :  (PTA) General:   Alert,  Well-developed, well-nourished, acutely ill-appearing older white male, pleasant and cooperative in NAD-wife at bedside NG in place Head:  Normocephalic and atraumatic. Eyes:  Sclera clear, no icterus.   Conjunctiva pink. Ears:  Normal auditory acuity. Nose:  No deformity, discharge,  or  lesions. Mouth:  No deformity or lesions.   Neck:  Supple; no masses or thyromegaly. Lungs:  Clear throughout to auscultation.   No wheezes, crackles, or rhonchi. Heart: tachy Regular rate and rhythm; no murmurs, clicks, rubs,  or gallops. Abdomen: Obese, protuberant , surgical incision covered, colostomy left mid quadrant, bowel sounds quiet Rectal: Not done Msk:  Symmetrical without gross deformities. .  Extremities:  Without clubbing or edema. Neurologic:  Alert and  oriented x4;  grossly normal neurologically. Skin:  Intact without significant lesions or rashes.. Psych:  Alert and cooperative. Normal mood and affect.  Intake/Output from previous day: 11/23 0701 - 11/24 0700 In: 4885.1 [I.V.:4362.2; IV Piggyback:522.9] Out: 975 [Urine:175; Emesis/NG output:500; Blood:300] Intake/Output this shift: Total I/O In: 387.5 [I.V.:387.5] Out: -   Lab Results: Recent Labs    02/07/22 2030 02/07/22 2031 02/08/22 0726  WBC 2.2*  --  7.0  HGB 12.9* 13.9 10.5*  HCT 38.1* 41.0 31.7*  PLT 404*  --  302   BMET Recent Labs    02/07/22 2030 02/07/22 2031 02/08/22 0726  NA 133* 131* 134*  K 4.0 3.9 4.4  CL 96* 95* 104  CO2 19*  --  16*  GLUCOSE 155* 155* 158*  BUN '16 15 17  '$ CREATININE 0.67 0.50* 0.65  CALCIUM 8.3*  --  7.3*   LFT Recent Labs    02/08/22 0726  PROT 4.7*  ALBUMIN 1.8*  AST 22  ALT 18  ALKPHOS 44  BILITOT 1.6*   PT/INR Recent Labs    02/07/22 2030  LABPROT 15.8*  INR 1.3*   Hepatitis Panel No results for input(s): "HEPBSAG", "HCVAB", "HEPAIGM", "HEPBIGM" in the last 72 hours.    IMPRESSION:  #67 66 year old white male with new diagnosis of inflammatory bowel disease made at the time of colonoscopy 4 days ago on 02/04/2022 at which time he was found to have diffuse colitis with deep ulcerations throughout the entire colon and extensive mucosal edema in the rectum, normal-appearing TI. He was started on prednisone 40 mg daily, path is still  pending Unfortunately he had progressive worsening of symptoms over the past 3 days, then developed fever to 102, weakness, presyncope and complaints of upper and lower abdominal pain and presented to the emergency room last night. Work-up confirmed bowel perforation site unclear on imaging but had moderate free fluid and moderate free air  He is now status post exploratory lap, mobilization of the splenic flexure, resection of the descending and sigmoid colon where there was found to be a pinhole perforation in the sigmoid, and transverse colostomy created. Did have a lot of of purulent ascites noted at the time of surgery  It is unclear at this time whether he has ulcerative colitis or Crohn's disease, he has had a prior scrotal abscess as recently as July 2023 somewhat raising the concern for underlying Crohn's disease  Stable postoperatively, on IV Zosyn  #2 diverticulosis #3.  Adult onset diabetes mellitus with peripheral neuropathy #4.  hx Hypertension   PLAN: Await path from colonoscopy and surgical path Steroids have been discontinued  Continue IV Zosyn  QuantiFERON gold and hepatitis serologies had been drawn as an outpatient.  He will eventually need biologic therapy, clearly not until he has resolved peritonitis, and likely at least 4 weeks out from surgery He still has diseased bowel and the remaining portion of his colon. No other specific therapy from a GI perspective over the next few weeks, until he recovers fully from surgery.    Denzel Etienne PA-C 02/08/2022, 12:17 PM

## 2022-02-08 NOTE — Anesthesia Procedure Notes (Signed)
Procedure Name: Intubation Date/Time: 02/08/2022 12:51 AM  Performed by: [place] Manus Rudd, CRNAPre-anesthesia Checklist: Patient identified, Emergency Drugs available, Suction available and Patient being monitored Patient Re-evaluated:Patient Re-evaluated prior to induction Oxygen Delivery Method: Circle system utilized Preoxygenation: Pre-oxygenation with 100% oxygen Induction Type: IV induction Ventilation: Mask ventilation without difficulty Laryngoscope Size: Mac and 4 Grade View: Grade I Tube type: Oral Tube size: 7.5 mm Number of attempts: 1 Airway Equipment and Method: Stylet and Oral airway Placement Confirmation: ETT inserted through vocal cords under direct vision, positive ETCO2 and breath sounds checked- equal and bilateral Secured at: 22 cm Tube secured with: Tape Dental Injury: Teeth and Oropharynx as per pre-operative assessment

## 2022-02-08 NOTE — Progress Notes (Signed)
Pharmacy Antibiotic Note  Richard [name] is a 66 y.o. male admitted on 02/07/2022 with intra-abdominal infection.  In the ED patient received Vancomycin, Cefepime and Metronidazole x 1 dose each.  Upon admission, Pharmacy has been consulted for Zosyn dosing.  Plan: Zosyn 3.375g IV q8h (4 hour infusion). Need for further dosage adjustment appears unlikely at present.    Will sign off at this time.  Please reconsult if a change in clinical status warrants re-evaluation of dosage.     Temp (24hrs), Avg:102.1 F (38.9 C), Min:102.1 F (38.9 C), Max:102.1 F (38.9 C)  Recent Labs  Lab 02/04/22 1038 02/07/22 2030 02/07/22 2031 02/07/22 2224  WBC 4.4 2.2*  --   --   CREATININE  --  0.67 0.50*  --   LATICACIDVEN  --  2.3*  --  1.3    Estimated Creatinine Clearance: 114.5 mL/min (A) (by C-G formula based on SCr of 0.5 mg/dL (L)).    Allergies  Allergen Reactions   Lobster [Shellfish Allergy] Nausea Only   Poison Ivy Extract Itching    Thank you for allowing pharmacy to be a part of this patient's care.  Everette Rank, PharmD 02/08/2022 12:06 AM

## 2022-02-08 NOTE — Progress Notes (Signed)
Pt refused cpap tonight. Pt still got NGT in place and said it will be very uncomfortable. Hold cpap until NG tube removed.

## 2022-02-08 NOTE — Progress Notes (Signed)
1 Day Post-Op Ex lap, L colectomy and colostomy Subjective: Feeling better, Good UOP, NG output clearing  Objective: Vital signs in last 24 hours: Temp:  [97.4 F (36.3 C)-102.1 F (38.9 C)] 98.9 F (37.2 C) (11/24 0700) Pulse Rate:  [107-142] 112 (11/24 0700) Resp:  [11-42] 14 (11/24 0700) BP: (89-132)/(54-102) 90/58 (11/24 0700) SpO2:  [85 %-100 %] 96 % (11/24 0700) Weight:  [109.1 kg] 109.1 kg (11/24 0500)   Intake/Output from previous day: 11/23 0701 - 11/24 0700 In: 4885.1 [I.V.:4362.2; IV Piggyback:522.9] Out: 975 [Urine:175; Emesis/NG output:500; Blood:300] Intake/Output this shift: No intake/output data recorded.   General appearance: alert and cooperative GI: normal findings: soft, ostomy viable  Incision: no significant drainage  Lab Results:  Recent Labs    02/07/22 2030 02/07/22 2031 02/08/22 0726  WBC 2.2*  --  7.0  HGB 12.9* 13.9 10.5*  HCT 38.1* 41.0 31.7*  PLT 404*  --  302   BMET Recent Labs    02/07/22 2030 02/07/22 2031 02/08/22 0726  NA 133* 131* 134*  K 4.0 3.9 4.4  CL 96* 95* 104  CO2 19*  --  16*  GLUCOSE 155* 155* 158*  BUN '16 15 17  '$ CREATININE 0.67 0.50* 0.65  CALCIUM 8.3*  --  7.3*   PT/INR Recent Labs    02/07/22 2030  LABPROT 15.8*  INR 1.3*   ABG No results for input(s): "PHART", "HCO3" in the last 72 hours.  Invalid input(s): "PCO2", "PO2"  MEDS, Scheduled  chlorhexidine  15 mL Mouth/Throat Once   Chlorhexidine Gluconate Cloth  6 each Topical Daily   insulin aspart  0-20 Units Subcutaneous Q4H   mouth rinse  15 mL Mouth Rinse 4 times per day   mouth rinse  15 mL Mouth Rinse 4 times per day    Studies/Results: CT Angio Chest PE W and/or Wo Contrast  Result Date: 02/07/2022 CLINICAL DATA:  Pulmonary embolism (PE) suspected, high prob EXAM: CT ANGIOGRAPHY CHEST WITH CONTRAST TECHNIQUE: Multidetector CT imaging of the chest was performed using the standard protocol during bolus administration of intravenous  contrast. Multiplanar CT image reconstructions and MIPs were obtained to evaluate the vascular anatomy. RADIATION DOSE REDUCTION: This exam was performed according to the departmental dose-optimization program which includes automated exposure control, adjustment of the mA and/or kV according to patient size and/or use of iterative reconstruction technique. CONTRAST:  182m OMNIPAQUE IOHEXOL 350 MG/ML SOLN COMPARISON:  None Available. FINDINGS: Cardiovascular: Mild cardiomegaly. Scattered coronary artery and aortic calcifications. No aneurysm. Mediastinum/Nodes: No mediastinal, hilar, or axillary adenopathy. Trachea and esophagus are unremarkable. Thyroid unremarkable. Lungs/Pleura: No pleural effusions.  Bibasilar atelectasis. Upper Abdomen: See abdominal CT report Musculoskeletal: Chest wall soft tissues are unremarkable. No acute bony abnormality. Review of the MIP images confirms the above findings. IMPRESSION: No evidence of pulmonary embolus. Bibasilar atelectasis. No acute cardiopulmonary disease. Electronically Signed   By: KRolm BaptiseM.D.   On: 02/07/2022 23:32   CT ABDOMEN PELVIS W CONTRAST  Result Date: 02/07/2022 CLINICAL DATA:  Sepsis EXAM: CT ABDOMEN AND PELVIS WITH CONTRAST TECHNIQUE: Multidetector CT imaging of the abdomen and pelvis was performed using the standard protocol following bolus administration of intravenous contrast. RADIATION DOSE REDUCTION: This exam was performed according to the departmental dose-optimization program which includes automated exposure control, adjustment of the mA and/or kV according to patient size and/or use of iterative reconstruction technique. CONTRAST:  1026mOMNIPAQUE IOHEXOL 350 MG/ML SOLN COMPARISON:  None Available. FINDINGS: Lower chest: Bibasilar atelectasis. Hepatobiliary: No  focal hepatic abnormality. Gallbladder unremarkable. Pancreas: No focal abnormality or ductal dilatation. Spleen: No focal abnormality.  Normal size. Adrenals/Urinary Tract:  No adrenal abnormality. No focal renal abnormality. No stones or hydronephrosis. Urinary bladder is unremarkable. Stomach/Bowel: Areas of bowel thickening within the transverse colon and descending colon. There also several mid small bowel loops which are thick walled in the lower abdomen and upper pelvis. Stomach grossly unremarkable. No bowel obstruction. Vascular/Lymphatic: Scattered aortic calcifications. No evidence of aneurysm or adenopathy. Reproductive: No visible focal abnormality. Other: There is moderate free fluid around the liver, spleen, in the right paracolic gutter and in the pelvis. Moderate free air is noted. Exact source is difficult to determine. Fluid and gas noted near the thick walled small bowel loop in the lower abdomen/upper pelvis which is possibly the source. Musculoskeletal: No acute bony abnormality. IMPRESSION: Findings compatible with perforated bowel with moderate free fluid and free air. Exact source not definitely visualized. Possibilities include a thick walled small bowel loop in the lower abdomen/upper pelvis, likely mid small bowel for the transverse colon/descending colon. Bibasilar atelectasis. Aortic atherosclerosis. Critical Value/emergent results were called by telephone at the time of interpretation on 02/07/2022 at 11:30 pm to provider [facility] , who verbally acknowledged these results. Electronically Signed   By: Rolm Baptise M.D.   On: 02/07/2022 23:30   DG Abd Portable 2 Views  Result Date: 02/07/2022 CLINICAL DATA:  sepsis EXAM: PORTABLE ABDOMEN - 2 VIEW COMPARISON:  None Available. FINDINGS: Limited evaluation due to overlapping osseous structures and overlying soft tissues. Gaseous distension of the large bowel. Question gaseous dilatation of a focal loop of small bowel within the right lower abdomen. There is no evidence of free air. No radio-opaque calculi or other significant radiographic abnormality is seen. IMPRESSION: Question gaseous dilatation  of a focal loop of small bowel within the right lower abdomen. Limited evaluation due to overlapping osseous structures and overlying soft tissues. If clinically indicated, please consider CT abdomen pelvis with intravenous contrast for further evaluation. Electronically Signed   By: Iven Finn M.D.   On: 02/07/2022 21:28   DG Chest Port 1 View  Result Date: 02/07/2022 CLINICAL DATA:  Questionable sepsis - evaluate for abnormality EXAM: PORTABLE CHEST 1 VIEW COMPARISON:  Chest x-ray 06/13/2011 FINDINGS: The heart and mediastinal contours are unchanged. Low lung volumes with bibasilar airspace opacities likely representing atelectasis. No focal consolidation. No pulmonary edema. No pleural effusion. No pneumothorax. No acute osseous abnormality. IMPRESSION: Low lung volumes with no definite acute cardiopulmonary abnormality. Electronically Signed   By: Iven Finn M.D.   On: 02/07/2022 21:19    Assessment: s/p Procedure(s): EXPLORATORY LAPAROTOMY, COLON RESECTION, OSTOMY Patient Active Problem List   Diagnosis Date Noted   Perforated abdominal viscus 02/08/2022   IFG (impaired fasting glucose) 09/27/2015   Borderline hypertension 09/27/2015   Lateral epicondylitis of left elbow 01/27/2015   Fracture of toe, closed 05/18/2014   Seborrheic dermatitis 02/28/2014   Subacromial bursitis 11/09/2013   Bronchopneumonia 11/03/2013   Cerumen impaction 11/03/2013   Posterior interosseous nerve syndrome 10/25/2013   Hearing impairment 10/04/2013   Right shoulder pain 10/04/2013   Health maintenance examination 02/26/2013   Hyperlipidemia 06/15/2011   SEVERE OBSTRUCTIVE SLEEP APNEA with AHI 94 03/30/2010   Morbid obesity ([place]) 02/06/2010   Adult ADHD 03/07/2009   ALLERGIC RHINITIS 03/06/2009   DERMATITIS 03/06/2009    Expected post op course  Plan: Cont IV Abx x 5 days Cont NG: await return of bowel function Ok  for sips of clears for comfort Ambulate  Cont foley Per CCM for  medical issues   LOS: 0 days     .Rosario Adie, [facility], [place]    02/08/2022 8:52 AM

## 2022-02-08 NOTE — Progress Notes (Addendum)
NAME:  Richard [name], MRN:  [id number], DOB:  May 10, 1955, LOS: 0 ADMISSION DATE:  02/07/2022, CONSULTATION DATE:  02/08/2022 REFERRING MD:  Dr. Wyvonnia Dusky, ER, CHIEF COMPLAINT:  Abdominal pain   History of Present Illness:  66 y/o male, former smoker, developed diarrhea about 3 to 4 weeks prior to admission.  He was seen by Dr. [name] with GI.  He had colonoscopy on 02/04/22.  This showed diffuse, severe inflammation throughout the colon with mucosal friability and biopsies were performed.  He was found to have Crohn's colitis and started on prednisone.  He started having discomfort in his abdomen on 02/06/22.  This started in his lower abdomen and migrated toward his upper abdomen.  He started feeling bloated as well.  He had trouble getting off the commode due to feeling weak "like he was frozen".  His wife called EMS and he was brought to the ER.  He had fever 102.1 F with tachycardia.  CT abd/pelvis showed findings compatible with perforated bowel with moderate free fluid and free air.  He was started on antibiotics and IV fluids.  Surgery consulted to assess for emergent laparotomy.  PCCM consulted to admit to ICU.  Pertinent  Medical History  Scrotal abscess August 2023, Colon polyp, Diverticulosis, ADHD, Anxiety, Rhinitis, COVID July 2022, HLD, HTN, OSA, PNA, Seborrheic dermatitis, Hypogonadism, DM type 2, Peripheral neuropathy  Significant Hospital Events: Including procedures, antibiotic start and stop dates in addition to other pertinent events   11/24 Admit, surgery consulted  Interim History / Subjective:  Pt reports he feels thirsty, doing "ok considering everything" Afebrile  Wife at bedside  4L [place]  Blood cultures pending  I/O 143m UOP, 500 ml NGT, ~300 EBL from surgery, +3.9L since admit   Objective   Blood pressure 116/64, pulse (!) 122, temperature 98.9 F (37.2 C), temperature source Axillary, resp. rate 18, height '5\' 9"'$  (1.753 m), weight 109.1 kg, SpO2 92 %.         Intake/Output Summary (Last 24 hours) at 02/08/2022 0914 Last data filed at 02/08/2022 0800 Gross per 24 hour  Intake 5272.6 ml  Output 975 ml  Net 4297.6 ml   Filed Weights   02/08/22 0500  Weight: 109.1 kg    Examination: General: adult male sitting in bed in NAD, wife at bedside HEENT: MM pink/dry, pupils =/reactive, anicteric , NGT in place  Neuro: AAOx4, speech clear, MAE CV: s1s2 RRR, no m/r/g PULM: non-labored at rest, lungs bilaterally clear with good air entry  GI: soft, bsx4 hypoactive, dressing middle lower abd clean/dry, ostomy on left with pink stoma Extremities: warm/dry, trace to 1+ edema  Skin: no rashes or lesions  Resolved Hospital Problem list     Assessment & Plan:   Acute peritonitis with sepsis from perforated abdominal viscus s/p emergent laparotomy 11/24. Recent colonoscopy with diagnosis of Crohn's colitis.   -appreciate CCS  -NPO x ice chips -NGT in place LIS -PRN dilaudid for pain  -continue zosyn -follow culture results  -continue IVF, LR at 159mhr for 20 hr, may need some IVF given NPO status after  -[facility] consulted to assist in ongoing management of Chron's Colitis   At Risk AKI  In setting of contrast administration -remove foley if sr cr remains stable  -Trend BMP / urinary output -Replace electrolytes as indicated -Avoid nephrotoxic agents, ensure adequate renal perfusion  Hx of HTN, HLD. -hold outpatient lisinopril, lipitor   DM type 2 poorly controlled with hyperglycemia. -SSI  -hold outpatient metformin,  jardiance  Hx of OSA. Baseline CPAP 10 cm H20, followed by Dr. Ander Slade -QSH CPAP  -NGT may impede mask seal   Hx of ADHD, Anxiety. -hold home methylphenidate   Hx of Hypogonadism. -hold outpatient testosterone   Moderate Protein Calorie Malnutrition (POA) -nutrition when ok per CCS  Best Practice (right click and "Reselect all SmartList Selections" daily)  Diet/type: NPO DVT prophylaxis: SCD GI  prophylaxis: N/A Lines: N/A Foley:  Yes, and it is still needed Code Status:  full code Last date of multidisciplinary goals of care discussion: wife updated on plan of care 11/24   Critical care time: n/a    Noe Gens, MSN, APRN, NP-C, AGACNP-BC [facility] 02/08/2022, 9:14 AM   Please see [email or web address] for pager details.   From 7A-7P if no response, please call [phone] After hours, please call Warren Lacy [phone]    I have taken an interval history, reviewed the chart and examined the patient. I agree with the Advanced Practitioner's note, impression, and recommendations as outlined.   Briefly, 66 year old male with Chrohns colitis on prednisone s/p ex-lap for perforated abdomen. No complaints this morning. Not on pressors and on supplemental O2. On exam, well appearing male laying in bed, nad. Lungs CTA no wheezing or rhonchi. Heart RRR, no murmur. Abdomen soft with midline dressing in place, clean and dry, ostomy. Extremities trace edema, warm to touch.  Imaging, labs and test noted above have been reviewed independently by me.  Assessment/Plan  Acute peritonitis 2/2 perforated abdomen s/p ex-lap with bowel resection and ostomy creation - post-op care and pain management per surgical team. Continue Zosyn x 5days, f/u cultures. Maintenance fluids. Crohn's colitis - GI consulted. OSA - CPAP nightly  No critical care needs. Will transfer to [facility] for pick up tomorrow for further medical managment  Independent Care Time: 35 Minutes.   Rodman Pickle, M.D. [facility]Critical Care Medicine 02/08/2022 12:24 PM   Please see Amion for pager number to reach on-call Pulmonary and Critical Care Team.

## 2022-02-08 NOTE — Plan of Care (Signed)
Discussed with patient plan of care, pain management and admission questions with some teach back displayed  Problem: Education: Goal: Ability to describe self-care measures that may prevent or decrease complications (Diabetes Survival Skills Education) will improve Outcome: Progressing

## 2022-02-08 NOTE — Anesthesia Preprocedure Evaluation (Addendum)
Anesthesia Evaluation  Patient identified by MRN, date of birth, ID band Patient awake    Reviewed: Allergy & Precautions, NPO status , Patient's Chart, lab work & pertinent test results  Airway Mallampati: III  TM Distance: >3 FB Neck ROM: Full    Dental no notable dental hx. (+) Teeth Intact, Dental Advisory Given   Pulmonary sleep apnea and Continuous Positive Airway Pressure Ventilation , former smoker   Pulmonary exam normal breath sounds clear to auscultation       Cardiovascular hypertension, Pt. on medications Normal cardiovascular exam Rhythm:Regular Rate:Normal     Neuro/Psych  PSYCHIATRIC DISORDERS Anxiety     negative neurological ROS     GI/Hepatic negative GI ROS, Neg liver ROS,,,Recent diagnosis of Crohn's    Endo/Other  diabetes, Type 2, Oral Hypoglycemic Agents    Renal/GU negative Renal ROS  negative genitourinary   Musculoskeletal negative musculoskeletal ROS (+)    Abdominal   Peds  Hematology negative hematology ROS (+)   Anesthesia Other Findings Acute peritonitis with sepsis from perforated abdominal viscus after recent colonoscopy with diagnosis of Crohn's colitis.  Reproductive/Obstetrics                             Anesthesia Physical Anesthesia Plan  ASA: 3 and emergent  Anesthesia Plan: General   Post-op Pain Management: Ketamine IV* and Dilaudid IV   Induction: Intravenous and Rapid sequence  PONV Risk Score and Plan: 2 and Midazolam, Dexamethasone and Ondansetron  Airway Management Planned: Oral ETT  Additional Equipment:   Intra-op Plan:   Post-operative Plan: Extubation in OR and Possible Post-op intubation/ventilation  Informed Consent: I have reviewed the patients History and Physical, chart, labs and discussed the procedure including the risks, benefits and alternatives for the proposed anesthesia with the patient or authorized representative  who has indicated his/her understanding and acceptance.     Dental advisory given  Plan Discussed with: CRNA  Anesthesia Plan Comments:        Anesthesia Quick Evaluation

## 2022-02-08 NOTE — H&P (Signed)
NAME:  Richard [name], MRN:  [id number], DOB:  27-Jun-1955, LOS: 0 ADMISSION DATE:  02/07/2022, CONSULTATION DATE:  02/08/2022 REFERRING MD:  Dr. Wyvonnia Dusky, ER, CHIEF COMPLAINT:  Abdominal pain   History of Present Illness:  66 yo male former smoker developed diarrhea about 3 to 4 weeks prior to admission.  He was seen by Dr. [name] with GI.  He had colonoscopy on 02/04/22.  This showed diffuse, severe inflammation throughout the colon with mucosal friability and biopsies were performed.  He was found to have Crohn's colitis and started on prednisone.  He started having discomfort in his abdomen on 02/06/22.  This started in his lower abdomen and migrated toward his upper abdomen.  He started feeling bloated as well.  He had trouble getting off the commode due to feeling weak "like he was frozen".  His wife called EMS and he was brought to the ER.  He had fever 102.1 F with tachycardia.  CT abd/pelvis showed findings compatible with perforated bowel with moderate free fluid and free air.  He was started on antibiotics and IV fluids.  Surgery consulted to assess for emergent laparotomy.  PCCM consulted to admit to ICU.  Pertinent  Medical History  Scrotal abscess August 2023, Colon polyp, Diverticulosis, ADHD, Anxiety, Rhinitis, COVID July 2022, HLD, HTN, OSA, PNA, Seborrheic dermatitis, Hypogonadism, DM type 2, Peripheral neuropathy  Significant Hospital Events: Including procedures, antibiotic start and stop dates in addition to other pertinent events   11/24 Admit, surgery consulted  Interim History / Subjective:  He feels thirsty.  Has discomfort throughout his abdomen.  Not having nausea, chest pain, or dyspnea.  Objective   Blood pressure 122/71, pulse (!) 130, temperature (!) 102.1 F (38.9 C), temperature source Oral, resp. rate (!) 29, SpO2 96 %.       No intake or output data in the 24 hours ending 02/08/22 0003 There were no vitals filed for this visit.  Examination:  General  - alert Eyes - pupils reactive ENT - no sinus tenderness, no stridor Cardiac - regular, tachycardic Chest - equal breath sounds b/l, no wheezing or rales Abdomen - distended, increased tympany, absent bowel sounds Extremities - 1 +edema Skin - no rashes Neuro - normal strength, moves extremities, follows commands Psych - normal mood and behavior  Resolved Hospital Problem list     Assessment & Plan:   Acute peritonitis with sepsis from perforated abdominal viscus after recent colonoscopy with diagnosis of Crohn's colitis. - surgery assessing for emergent laparotomy - continue ABx, IV fluids - f/u culture results - will need to consult [facility] in the morning to assist with ongoing management of Crohn's colitis  Hx of HTN, HLD. - hold outpt lipitor, lisinopril  DM type 2 poorly controlled with hyperglycemia. - SSI - hold outpt jardiance, metformin  Hx of OSA. - will need to resume CPAP after surgery - uses CPAP 10 cm H2O and followed by Dr. Ander Slade  Hx of ADHD, Anxiety. - hold outpt methylphenidate  Hx of Hypogonadism. - hold outpt testosterone  Best Practice (right click and "Reselect all SmartList Selections" daily)   Diet/type: NPO DVT prophylaxis: SCD GI prophylaxis: N/A Lines: N/A Foley:  N/A Code Status:  full code Last date of multidisciplinary goals of care discussion [updated his wife at bedside]  Labs   CBC: Recent Labs  Lab 02/04/22 1038 02/07/22 2030 02/07/22 2031  WBC 4.4 2.2*  --   NEUTROABS 3.1 1.8  --   HGB 9.9* 12.9*  13.9  HCT 28.8* 38.1* 41.0  MCV 102.4* 101.6*  --   PLT 263.0 404*  --     Basic Metabolic Panel: Recent Labs  Lab 02/07/22 2030 02/07/22 2031  NA 133* 131*  K 4.0 3.9  CL 96* 95*  CO2 19*  --   GLUCOSE 155* 155*  BUN 16 15  CREATININE 0.67 0.50*  CALCIUM 8.3*  --    GFR: Estimated Creatinine Clearance: 114.5 mL/min (A) (by C-G formula based on SCr of 0.5 mg/dL (L)). Recent Labs  Lab 02/04/22 1038  02/07/22 2030 02/07/22 2224  WBC 4.4 2.2*  --   LATICACIDVEN  --  2.3* 1.3    Liver Function Tests: Recent Labs  Lab 02/07/22 2030  AST 40  ALT 29  ALKPHOS 87  BILITOT 1.4*  PROT 7.2  ALBUMIN 2.3*   No results for input(s): "LIPASE", "AMYLASE" in the last 168 hours. No results for input(s): "AMMONIA" in the last 168 hours.  ABG    Component Value Date/Time   TCO2 22 02/07/2022 2031     Coagulation Profile: Recent Labs  Lab 02/07/22 2030  INR 1.3*    Cardiac Enzymes: Recent Labs  Lab 02/07/22 2255  CKTOTAL 231    HbA1C: Hemoglobin A1C  Date/Time Value Ref Range Status  01/23/2022 01:58 PM 6.6 (A) 4.0 - 5.6 % Final  10/11/2021 02:12 PM 7.2 (A) 4.0 - 5.6 % Final   HbA1c, POC (prediabetic range)  Date/Time Value Ref Range Status  01/23/2022 01:58 PM 6.6 (A) 5.7 - 6.4 % Final  10/11/2021 02:12 PM 7.2 (A) 5.7 - 6.4 % Final   HbA1c, POC (controlled diabetic range)  Date/Time Value Ref Range Status  01/23/2022 01:58 PM 6.6 0.0 - 7.0 % Final  10/11/2021 02:12 PM 7.2 (A) 0.0 - 7.0 % Final   HbA1c POC (<> result, manual entry)  Date/Time Value Ref Range Status  01/23/2022 01:58 PM 6.6 4.0 - 5.6 % Final  10/11/2021 02:12 PM 7.2 4.0 - 5.6 % Final    CBG: Recent Labs  Lab 02/07/22 2010  GLUCAP 145*    Review of Systems:   Reviewed and negative  Past Medical History:  He,  has a past medical history of Adult ADHD, Allergic rhinitis, Anxiety, Colon polyps (2012 and 2013), COVID-19 virus infection (09/29/2020), Elevated transaminase level (2017), Hyperlipidemia, Hypertension, Obesity, OSA on CPAP, Perineal abscess, Pneumonia, Seborrheic dermatitis of scalp, Secondary male hypogonadism (04/2021), and Type 2 diabetes mellitus with microalbuminuria ([place]) (DM dx-09/27/2015. Microalb 2020).   Surgical History:   Past Surgical History:  Procedure Laterality Date   COLONOSCOPY W/ POLYPECTOMY  04/2010; 10/2011   Initial screening TCS showed 6 polyps (one  adenomatous).  Pt says he returned for repeat TCS 10/2011 showed one hyperplastic polyp: Recall 10 yrs per Dr. Deatra Ina   INCISION AND DRAINAGE ABSCESS N/A 11/07/2021   Procedure: Incision and drainage of scrotal abscess;  Surgeon: Leighton Ruff, MD;  Location: [facility];  Service: General;  Laterality: N/A;   MOUTH SURGERY       Social History:   reports that he quit smoking about 19 years ago. His smoking use included cigarettes. He smoked an average of 1 pack per day. He has never used smokeless tobacco. He reports current alcohol use of about 7.0 standard drinks of alcohol per week. He reports that he does not use drugs.   Family History:  His family history includes Allergies in his father; Cancer in his father; Lung cancer in his father.  There is no history of Colon cancer, Esophageal cancer, Rectal cancer, or Stomach cancer.   Allergies Allergies  Allergen Reactions   Lobster [Shellfish Allergy] Nausea Only   Poison Ivy Extract Itching     Home Medications  Prior to Admission medications   Medication Sig Start Date End Date Taking? Authorizing Provider  Accu-Chek Softclix Lancets lancets SMARTSIG:Topical 01/19/20   [provider]  atorvastatin (LIPITOR) 40 MG tablet TAKE 1 TABLET BY MOUTH EVERY DAY 02/01/22   McGowen, Adrian Blackwater, MD  clotrimazole-betamethasone (LOTRISONE) cream Apply 1 Application topically 2 (two) times daily. 01/23/22   McGowen, Adrian Blackwater, MD  diphenoxylate-atropine (LOMOTIL) 2.5-0.025 MG tablet Take 1-2 tablets by mouth 4 (four) times daily as needed for diarrhea or loose stools. 1-2 tabs po qid prn diarrhea 01/12/22   Pyrtle, Lajuan Lines, MD  empagliflozin (JARDIANCE) 10 MG TABS tablet TAKE 1 TABLET BY MOUTH EVERY DAY BEFORE BREAKFAST 02/05/22   McGowen, Adrian Blackwater, MD  glucose blood (ACCU-CHEK GUIDE) test strip USE TO TEST BLOOD SUGAR TWICE DAILY 07/12/21   McGowen, Adrian Blackwater, MD  lisinopril (ZESTRIL) 10 MG tablet TAKE 1 TABLET DAILY 01/10/22   McGowen, Adrian Blackwater, MD   metFORMIN (GLUCOPHAGE) 500 MG tablet TAKE 1 TABLET BY MOUTH TWICE A DAY WITH FOOD 02/01/22   McGowen, Adrian Blackwater, MD  methylphenidate (METADATE CD) 20 MG CR capsule Take 1 capsule (20 mg total) by mouth daily as needed. 01/16/22   McGowen, Adrian Blackwater, MD  predniSONE (DELTASONE) 10 MG tablet Take 4 tablets (40 mg total) by mouth daily with breakfast for 14 days. After 14 days, then slow taper by '5mg'$  every 7 days. 02/04/22 02/18/22  Cirigliano, Vito V, DO  SYRINGE-NEEDLE, DISP, 3 ML 22G X 1-1/2" 3 ML MISC USE TO ADMINISTER TESTOSTERONE INJECTION EVERY 2 WEEKS 06/06/21   McGowen, Adrian Blackwater, MD  Syringe/Needle, Disp, 18G X 1" 3 ML MISC USE TO ADMINISTER TESTOSTERONE INJECTION EVERY 2 WEEKS 06/06/21   McGowen, Adrian Blackwater, MD  testosterone cypionate (DEPOTESTOSTERONE CYPIONATE) 200 MG/ML injection 200 MG (1 ML) SQ Q 2 WEEKS 12/12/21   McGowen, Adrian Blackwater, MD     Critical care time: 36 minutes  Chesley Mires, MD [place] Pager - [phone] 02/08/2022, 12:21 AM

## 2022-02-08 NOTE — H&P (Signed)
CC: abd pain  Requesting provider: Dr Wyvonnia Dusky  HPI: Richard [name] is an 66 y.o. male who is here for worsening abd pain, near syncope and fevers after recent colonoscopy.  Pt with new diagnosis of IBD and taking '40mg'$  Prednisone for the past few days.    Past Medical History:  Diagnosis Date   Adult ADHD    Allergic rhinitis    Anxiety    claustrophobic   Colon polyps 2012 and 2013   All hyperplastic except one adenomatous w/out high grade dysplasia (recall 2023 per Dr. Deatra Ina)   COVID-19 virus infection 09/29/2020   Elevated transaminase level 2017   Viral hep screening neg.  Suspect fatty liver.   Hyperlipidemia    Hypertension    Obesity    OSA on CPAP    Severe.  Wears CPAP but pt does not know his setting.  (Dr. 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 [name] to do surg   Pneumonia    Seborrheic dermatitis of scalp    and face: hydrocort 2% cream bid prn to face, and fluocinonide 0.5% sol'n to scalp bid prn per Dr. 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 Microalb 2020    Past Surgical History:  Procedure Laterality Date   COLONOSCOPY W/ POLYPECTOMY  04/2010; 10/2011   Initial screening TCS showed 6 polyps (one adenomatous).  Pt says he returned for repeat TCS 10/2011 showed one hyperplastic polyp: Recall 10 yrs per Dr. Deatra Ina   INCISION AND DRAINAGE ABSCESS N/A 11/07/2021   Procedure: Incision and drainage of scrotal abscess;  Surgeon: Leighton Ruff, MD;  Location: [facility];  Service: General;  Laterality: N/A;   MOUTH SURGERY      Family History  Problem Relation Age of Onset   Lung cancer Father    Cancer Father        nasal   Allergies Father    Colon cancer Neg Hx    Esophageal cancer Neg Hx    Rectal cancer Neg Hx    Stomach cancer Neg Hx     Social:  reports that he quit smoking about 19 years ago. His smoking use included  cigarettes. He smoked an average of 1 pack per day. He has never used smokeless tobacco. He reports current alcohol use of about 7.0 standard drinks of alcohol per week. He reports that he does not use drugs.  Allergies:  Allergies  Allergen Reactions   Lobster [Shellfish Allergy] Nausea Only   Poison Ivy Extract Itching    Medications: I have reviewed the patient's current medications.  Results for orders placed or performed during the hospital encounter of 02/07/22 (from the past 48 hour(s))  CBG monitoring, ED     Status: Abnormal   Collection Time: 02/07/22  8:10 PM  Result Value Ref Range   Glucose-Capillary 145 (H) 70 - 99 mg/dL    Comment: Glucose reference range applies only to samples taken after fasting for at least 8 hours.  Lactic acid, plasma     Status: Abnormal   Collection Time: 02/07/22  8:30 PM  Result Value Ref Range   Lactic Acid, Venous 2.3 (HH) 0.5 - 1.9 mmol/L    Comment: CRITICAL RESULT CALLED TO, READ BACK BY AND VERIFIED WITH GIBSON,K RN AT 2129 ON 02/07/22 BY VAZQUEZJ Performed at [facility], [place] [address], [place], [place] [address]   Comprehensive  metabolic panel     Status: Abnormal   Collection Time: 02/07/22  8:30 PM  Result Value Ref Range   Sodium 133 (L) 135 - 145 mmol/L   Potassium 4.0 3.5 - 5.1 mmol/L   Chloride 96 (L) 98 - 111 mmol/L   CO2 19 (L) 22 - 32 mmol/L   Glucose, Bld 155 (H) 70 - 99 mg/dL    Comment: Glucose reference range applies only to samples taken after fasting for at least 8 hours.   BUN 16 8 - 23 mg/dL   Creatinine, Ser 0.67 0.61 - 1.24 mg/dL   Calcium 8.3 (L) 8.9 - 10.3 mg/dL   Total Protein 7.2 6.5 - 8.1 g/dL   Albumin 2.3 (L) 3.5 - 5.0 g/dL   AST 40 15 - 41 U/L   ALT 29 0 - 44 U/L   Alkaline Phosphatase 87 38 - 126 U/L   Total Bilirubin 1.4 (H) 0.3 - 1.2 mg/dL   GFR, Estimated >60 >60 mL/min    Comment: (NOTE) Calculated using the CKD-EPI Creatinine Equation (2021)    Anion gap 18 (H) 5 - 15     Comment: Performed at [facility], [place] [address], [place], [place] [address]  CBC with Differential     Status: Abnormal   Collection Time: 02/07/22  8:30 PM  Result Value Ref Range   WBC 2.2 (L) 4.0 - 10.5 K/uL   RBC 3.75 (L) 4.22 - 5.81 MIL/uL   Hemoglobin 12.9 (L) 13.0 - 17.0 g/dL   HCT 38.1 (L) 39.0 - 52.0 %   MCV 101.6 (H) 80.0 - 100.0 fL   MCH 34.4 (H) 26.0 - 34.0 pg   MCHC 33.9 30.0 - 36.0 g/dL   RDW 13.2 11.5 - 15.5 %   Platelets 404 (H) 150 - 400 K/uL   nRBC 0.0 0.0 - 0.2 %   Neutrophils Relative % 80 %   Neutro Abs 1.8 1.7 - 7.7 K/uL   Lymphocytes Relative 12 %   Lymphs Abs 0.3 (L) 0.7 - 4.0 K/uL   Monocytes Relative 6 %   Monocytes Absolute 0.1 0.1 - 1.0 K/uL   Eosinophils Relative 0 %   Eosinophils Absolute 0.0 0.0 - 0.5 K/uL   Basophils Relative 1 %   Basophils Absolute 0.0 0.0 - 0.1 K/uL   Immature Granulocytes 1 %   Abs Immature Granulocytes 0.02 0.00 - 0.07 K/uL    Comment: Performed at [facility], [place] [address], [place], [place] [address]  Protime-INR     Status: Abnormal   Collection Time: 02/07/22  8:30 PM  Result Value Ref Range   Prothrombin Time 15.8 (H) 11.4 - 15.2 seconds   INR 1.3 (H) 0.8 - 1.2    Comment: (NOTE) INR goal varies based on device and disease states. Performed at [facility], [place] [address] [place], [place] [address]   APTT     Status: None   Collection Time: 02/07/22  8:30 PM  Result Value Ref Range   aPTT 30 24 - 36 seconds    Comment: Performed at [facility], [place] [address], [place], [place] [address]  Blood Culture (routine x 2)     Status: None (Preliminary result)   Collection Time: 02/07/22  8:30 PM   Specimen: BLOOD LEFT ARM  Result Value Ref Range   Specimen Description      BLOOD LEFT ARM Performed at [place],  [place] [address]    Special Requests      BOTTLES DRAWN AEROBIC AND ANAEROBIC Blood  Culture adequate volume Performed at [place] [address], [place], [place] [address]    Culture PENDING    Report Status PENDING   D-dimer, quantitative     Status: Abnormal   Collection Time: 02/07/22  8:30 PM  Result Value Ref Range   D-Dimer, Quant 11.05 (H) 0.00 - 0.50 ug/mL-FEU    Comment: (NOTE) At the manufacturer cut-off value of 0.5 g/mL FEU, this assay has a negative predictive value of 95-100%.This assay is intended for use in conjunction with a clinical pretest probability (PTP) assessment model to exclude pulmonary embolism (PE) and deep venous thrombosis (DVT) in outpatients suspected of PE or DVT. Results should be correlated with clinical presentation. Performed at [facility], [place] [address] [place], [place] [address]   Troponin I (High Sensitivity)     Status: None   Collection Time: 02/07/22  8:30 PM  Result Value Ref Range   Troponin I (High Sensitivity) 10 <18 ng/L    Comment: (NOTE) Elevated high sensitivity troponin I (hsTnI) values and significant  changes across serial measurements may suggest ACS but many other  chronic and acute conditions are known to elevate hsTnI results.  Refer to the "Links" section for chest pain algorithms and additional  guidance. Performed at [facility], [place] [address] [place], [place] [address]   Type and screen [place]     Status: None   Collection Time: 02/07/22  8:30 PM  Result Value Ref Range   ABO/RH(D) O NEG    Antibody Screen NEG    Sample Expiration      02/10/2022,2359 Performed at [facility], [place] [address], [place], [place] [address]   I-stat chem 8, ED (not at [facility] or [facility])     Status: Abnormal   Collection Time: 02/07/22  8:31 PM  Result Value Ref Range   Sodium 131 (L) 135 - 145 mmol/L   Potassium 3.9 3.5 - 5.1 mmol/L   Chloride 95 (L) 98 - 111 mmol/L   BUN 15 8 - 23 mg/dL   Creatinine,  Ser 0.50 (L) 0.61 - 1.24 mg/dL   Glucose, Bld 155 (H) 70 - 99 mg/dL    Comment: Glucose reference range applies only to samples taken after fasting for at least 8 hours.   Calcium, Ion 1.06 (L) 1.15 - 1.40 mmol/L   TCO2 22 22 - 32 mmol/L   Hemoglobin 13.9 13.0 - 17.0 g/dL   HCT 41.0 39.0 - 52.0 %  Urinalysis, Routine w reflex microscopic Urine, Clean Catch     Status: Abnormal   Collection Time: 02/07/22  9:14 PM  Result Value Ref Range   Color, Urine YELLOW YELLOW   APPearance CLEAR CLEAR   Specific Gravity, Urine 1.036 (H) 1.005 - 1.030   pH 6.0 5.0 - 8.0   Glucose, UA >=500 (A) NEGATIVE mg/dL   Hgb urine dipstick SMALL (A) NEGATIVE   Bilirubin Urine NEGATIVE NEGATIVE   Ketones, ur 20 (A) NEGATIVE mg/dL   Protein, ur 30 (A) NEGATIVE mg/dL   Nitrite NEGATIVE NEGATIVE   Leukocytes,Ua NEGATIVE NEGATIVE   RBC / HPF 0-5 0 - 5 RBC/hpf   WBC, UA 0-5 0 - 5 WBC/hpf   Bacteria, UA NONE SEEN NONE SEEN    Comment: Performed at [facility], [place] [address], [place], [place] [address]  Resp Panel by RT-PCR (Flu A&B, Covid) Anterior Nasal Swab     Status: None   Collection Time: 02/07/22  9:25 PM   Specimen: Anterior Nasal Swab  Result Value Ref Range   SARS Coronavirus 2 by RT PCR NEGATIVE NEGATIVE    Comment: (NOTE) SARS-CoV-2 target nucleic acids are NOT DETECTED.  The SARS-CoV-2 RNA is generally detectable in upper respiratory specimens during the acute phase of infection. The lowest concentration of SARS-CoV-2 viral copies this assay can detect is 138 copies/mL. A negative result does not preclude SARS-Cov-2 infection and should not be used as the sole basis for treatment or other patient management decisions. A negative result may occur with  improper specimen collection/handling, submission of specimen other than nasopharyngeal swab, presence of viral mutation(s) within the areas targeted by this assay, and inadequate number of viral copies(<138 copies/mL). A  negative result must be combined with clinical observations, patient history, and epidemiological information. The expected result is Negative.  Fact Sheet for Patients:  EntrepreneurPulse.com.au  Fact Sheet for Healthcare Providers:  IncredibleEmployment.be  This test is no t yet approved or cleared by the [place] FDA and  has been authorized for detection and/or diagnosis of SARS-CoV-2 by FDA under an Emergency Use Authorization (EUA). This EUA will remain  in effect (meaning this test can be used) for the duration of the COVID-19 declaration under Section 564(b)(1) of the Act, 21 U.S.C.section 360bbb-3(b)(1), unless the authorization is terminated  or revoked sooner.       Influenza A by PCR NEGATIVE NEGATIVE   Influenza B by PCR NEGATIVE NEGATIVE    Comment: (NOTE) The Xpert Xpress SARS-CoV-2/FLU/RSV plus assay is intended as an aid in the diagnosis of influenza from Nasopharyngeal swab specimens and should not be used as a sole basis for treatment. Nasal washings and aspirates are unacceptable for Xpert Xpress SARS-CoV-2/FLU/RSV testing.  Fact Sheet for Patients: EntrepreneurPulse.com.au  Fact Sheet for Healthcare Providers: IncredibleEmployment.be  This test is not yet approved or cleared by the [place] FDA and has been authorized for detection and/or diagnosis of SARS-CoV-2 by FDA under an Emergency Use Authorization (EUA). This EUA will remain in effect (meaning this test can be used) for the duration of the COVID-19 declaration under Section 564(b)(1) of the Act, 21 U.S.C. section 360bbb-3(b)(1), unless the authorization is terminated or revoked.  Performed at [facility], [place] [address], [place], [place] [address]   Lactic acid, plasma     Status: None   Collection Time: 02/07/22 10:24 PM  Result Value Ref Range   Lactic Acid, Venous 1.3 0.5 - 1.9 mmol/L     Comment: Performed at [facility], [place] [address] [place], [place] [address]  CK     Status: None   Collection Time: 02/07/22 10:55 PM  Result Value Ref Range   Total CK 231 49 - 397 U/L    Comment: Performed at [facility], [place] [address], [place], [place] [address]  Troponin I (High Sensitivity)     Status: None   Collection Time: 02/07/22 10:55 PM  Result Value Ref Range   Troponin I (High Sensitivity) 14 <18 ng/L    Comment: (NOTE) Elevated high sensitivity troponin I (hsTnI) values and significant  changes across serial measurements may suggest ACS but many other  chronic and acute conditions are known to elevate hsTnI results.  Refer to the "Links" section for chest pain algorithms and additional  guidance. Performed at [facility], [place] [address], [place], [place] [address]     CT Angio Chest PE W and/or Wo Contrast  Result Date: 02/07/2022 CLINICAL DATA:  Pulmonary embolism (PE) suspected, high prob EXAM: CT ANGIOGRAPHY CHEST WITH CONTRAST TECHNIQUE: Multidetector CT imaging of the chest was performed using the standard protocol during bolus administration of intravenous contrast. Multiplanar CT image reconstructions and MIPs were obtained to evaluate the vascular anatomy. RADIATION DOSE REDUCTION: This exam was performed according to the departmental dose-optimization program which includes automated exposure control, adjustment of the mA and/or kV according to patient size and/or use of iterative reconstruction technique. CONTRAST:  151m OMNIPAQUE IOHEXOL 350 MG/ML SOLN COMPARISON:  None Available. FINDINGS: Cardiovascular: Mild cardiomegaly. Scattered coronary artery and aortic calcifications. No aneurysm. Mediastinum/Nodes: No mediastinal, hilar, or axillary adenopathy. Trachea and esophagus are unremarkable. Thyroid unremarkable. Lungs/Pleura: No pleural effusions.  Bibasilar atelectasis. Upper Abdomen: See abdominal  CT report Musculoskeletal: Chest wall soft tissues are unremarkable. No acute bony abnormality. Review of the MIP images confirms the above findings. IMPRESSION: No evidence of pulmonary embolus. Bibasilar atelectasis. No acute cardiopulmonary disease. Electronically Signed   By: KRolm BaptiseM.D.   On: 02/07/2022 23:32   CT ABDOMEN PELVIS W CONTRAST  Result Date: 02/07/2022 CLINICAL DATA:  Sepsis EXAM: CT ABDOMEN AND PELVIS WITH CONTRAST TECHNIQUE: Multidetector CT imaging of the abdomen and pelvis was performed using the standard protocol following bolus administration of intravenous contrast. RADIATION DOSE REDUCTION: This exam was performed according to the departmental dose-optimization program which includes automated exposure control, adjustment of the mA and/or kV according to patient size and/or use of iterative reconstruction technique. CONTRAST:  1061mOMNIPAQUE IOHEXOL 350 MG/ML SOLN COMPARISON:  None Available. FINDINGS: Lower chest: Bibasilar atelectasis. Hepatobiliary: No focal hepatic abnormality. Gallbladder unremarkable. Pancreas: No focal abnormality or ductal dilatation. Spleen: No focal abnormality.  Normal size. Adrenals/Urinary Tract: No adrenal abnormality. No focal renal abnormality. No stones or hydronephrosis. Urinary bladder is unremarkable. Stomach/Bowel: Areas of bowel thickening within the transverse colon and descending colon. There also several mid small bowel loops which are thick walled in the lower abdomen and upper pelvis. Stomach grossly unremarkable. No bowel obstruction. Vascular/Lymphatic: Scattered aortic calcifications. No evidence of aneurysm or adenopathy. Reproductive: No visible focal abnormality. Other: There is moderate free fluid around the liver, spleen, in the right paracolic gutter and in the pelvis. Moderate free air is noted. Exact source is difficult to determine. Fluid and gas noted near the thick walled small bowel loop in the lower abdomen/upper pelvis  which is possibly the source. Musculoskeletal: No acute bony abnormality. IMPRESSION: Findings compatible with perforated bowel with moderate free fluid and free air. Exact source not definitely visualized. Possibilities include a thick walled small bowel loop in the lower abdomen/upper pelvis, likely mid small bowel for the transverse colon/descending colon. Bibasilar atelectasis. Aortic atherosclerosis. Critical Value/emergent results were called by telephone at the time of interpretation on 02/07/2022 at 11:30 pm to provider [facility] who verbally acknowledged these results. Electronically Signed   By: KeRolm Baptise.D.   On: 02/07/2022 23:30   DG Abd Portable 2 Views  Result Date: 02/07/2022 CLINICAL DATA:  sepsis EXAM: PORTABLE ABDOMEN - 2 VIEW COMPARISON:  None Available. FINDINGS: Limited evaluation due to overlapping osseous structures and overlying soft tissues. Gaseous distension of the large bowel. Question gaseous dilatation of a focal loop of small bowel within the right lower abdomen. There is no evidence of free air. No radio-opaque calculi or other significant radiographic  abnormality is seen. IMPRESSION: Question gaseous dilatation of a focal loop of small bowel within the right lower abdomen. Limited evaluation due to overlapping osseous structures and overlying soft tissues. If clinically indicated, please consider CT abdomen pelvis with intravenous contrast for further evaluation. Electronically Signed   By: Iven Finn M.D.   On: 02/07/2022 21:28   DG Chest Port 1 View  Result Date: 02/07/2022 CLINICAL DATA:  Questionable sepsis - evaluate for abnormality EXAM: PORTABLE CHEST 1 VIEW COMPARISON:  Chest x-ray 06/13/2011 FINDINGS: The heart and mediastinal contours are unchanged. Low lung volumes with bibasilar airspace opacities likely representing atelectasis. No focal consolidation. No pulmonary edema. No pleural effusion. No pneumothorax. No acute osseous abnormality.  IMPRESSION: Low lung volumes with no definite acute cardiopulmonary abnormality. Electronically Signed   By: Iven Finn M.D.   On: 02/07/2022 21:19    ROS - all of the below systems have been reviewed with the patient and positives are indicated with bold text General: chills, fever or night sweats Eyes: blurry vision or double vision ENT: epistaxis or sore throat Hematologic/Lymphatic: bleeding problems, blood clots or swollen lymph nodes Endocrine: temperature intolerance or unexpected weight changes Breast: new or changing breast lumps or nipple discharge Resp: cough, shortness of breath, or wheezing CV: chest pain or dyspnea on exertion GI: as per HPI GU: dysuria, trouble voiding, or hematuria Neuro: TIA or stroke symptoms    PE Blood pressure 122/71, pulse (!) 130, temperature (!) 102.1 F (38.9 C), temperature source Oral, resp. rate (!) 29, SpO2 96 %. Constitutional: NAD; conversant; no deformities Eyes: Moist conjunctiva; no lid lag; anicteric; PERRL Neck: Trachea midline; no thyromegaly Lungs: Normal respiratory effort CV: RRR GI: Abd diffuse TTP with mild peritonitis  MSK: Normal range of motion of extremities; no clubbing/cyanosis Psychiatric: Appropriate affect; alert and oriented x3  Results for orders placed or performed during the hospital encounter of 02/07/22 (from the past 48 hour(s))  CBG monitoring, ED     Status: Abnormal   Collection Time: 02/07/22  8:10 PM  Result Value Ref Range   Glucose-Capillary 145 (H) 70 - 99 mg/dL    Comment: Glucose reference range applies only to samples taken after fasting for at least 8 hours.  Lactic acid, plasma     Status: Abnormal   Collection Time: 02/07/22  8:30 PM  Result Value Ref Range   Lactic Acid, Venous 2.3 (HH) 0.5 - 1.9 mmol/L    Comment: CRITICAL RESULT CALLED TO, READ BACK BY AND VERIFIED WITH GIBSON,K RN AT 2129 ON 02/07/22 BY VAZQUEZJ Performed at [facility], [place] [address],  [place], [place] [address]   Comprehensive metabolic panel     Status: Abnormal   Collection Time: 02/07/22  8:30 PM  Result Value Ref Range   Sodium 133 (L) 135 - 145 mmol/L   Potassium 4.0 3.5 - 5.1 mmol/L   Chloride 96 (L) 98 - 111 mmol/L   CO2 19 (L) 22 - 32 mmol/L   Glucose, Bld 155 (H) 70 - 99 mg/dL    Comment: Glucose reference range applies only to samples taken after fasting for at least 8 hours.   BUN 16 8 - 23 mg/dL   Creatinine, Ser 0.67 0.61 - 1.24 mg/dL   Calcium 8.3 (L) 8.9 - 10.3 mg/dL   Total Protein 7.2 6.5 - 8.1 g/dL   Albumin 2.3 (L) 3.5 - 5.0 g/dL   AST 40 15 - 41 U/L   ALT 29 0 - 44  U/L   Alkaline Phosphatase 87 38 - 126 U/L   Total Bilirubin 1.4 (H) 0.3 - 1.2 mg/dL   GFR, Estimated >60 >60 mL/min    Comment: (NOTE) Calculated using the CKD-EPI Creatinine Equation (2021)    Anion gap 18 (H) 5 - 15    Comment: Performed at [facility], [place] [address] [place], [place] [address]  CBC with Differential     Status: Abnormal   Collection Time: 02/07/22  8:30 PM  Result Value Ref Range   WBC 2.2 (L) 4.0 - 10.5 K/uL   RBC 3.75 (L) 4.22 - 5.81 MIL/uL   Hemoglobin 12.9 (L) 13.0 - 17.0 g/dL   HCT 38.1 (L) 39.0 - 52.0 %   MCV 101.6 (H) 80.0 - 100.0 fL   MCH 34.4 (H) 26.0 - 34.0 pg   MCHC 33.9 30.0 - 36.0 g/dL   RDW 13.2 11.5 - 15.5 %   Platelets 404 (H) 150 - 400 K/uL   nRBC 0.0 0.0 - 0.2 %   Neutrophils Relative % 80 %   Neutro Abs 1.8 1.7 - 7.7 K/uL   Lymphocytes Relative 12 %   Lymphs Abs 0.3 (L) 0.7 - 4.0 K/uL   Monocytes Relative 6 %   Monocytes Absolute 0.1 0.1 - 1.0 K/uL   Eosinophils Relative 0 %   Eosinophils Absolute 0.0 0.0 - 0.5 K/uL   Basophils Relative 1 %   Basophils Absolute 0.0 0.0 - 0.1 K/uL   Immature Granulocytes 1 %   Abs Immature Granulocytes 0.02 0.00 - 0.07 K/uL    Comment: Performed at [facility], [place] [address], [place], [place] [address]  Protime-INR     Status: Abnormal   Collection Time:  02/07/22  8:30 PM  Result Value Ref Range   Prothrombin Time 15.8 (H) 11.4 - 15.2 seconds   INR 1.3 (H) 0.8 - 1.2    Comment: (NOTE) INR goal varies based on device and disease states. Performed at [facility], [place] [address], [place], [place] [address]   APTT     Status: None   Collection Time: 02/07/22  8:30 PM  Result Value Ref Range   aPTT 30 24 - 36 seconds    Comment: Performed at [facility], [place] [address] [place], [place] [address]  Blood Culture (routine x 2)     Status: None (Preliminary result)   Collection Time: 02/07/22  8:30 PM   Specimen: BLOOD LEFT ARM  Result Value Ref Range   Specimen Description      BLOOD LEFT ARM Performed at [place] [address], [place], [place] [address]    Special Requests      BOTTLES DRAWN AEROBIC AND ANAEROBIC Blood Culture adequate volume Performed at [place] [address], [place], [place] [address]    Culture PENDING    Report Status PENDING   D-dimer, quantitative     Status: Abnormal   Collection Time: 02/07/22  8:30 PM  Result Value Ref Range   D-Dimer, Quant 11.05 (H) 0.00 - 0.50 ug/mL-FEU    Comment: (NOTE) At the manufacturer cut-off value of 0.5 g/mL FEU, this assay has a negative predictive value of 95-100%.This assay is intended for use in conjunction with a clinical pretest probability (PTP) assessment model to exclude pulmonary embolism (PE) and deep venous thrombosis (DVT) in outpatients suspected of PE or DVT. Results should be correlated with clinical presentation. Performed at [facility], [place] [address], [place], [place] [address]   Troponin I (High Sensitivity)     Status: None   Collection Time: 02/07/22  8:30 PM  Result Value Ref Range   Troponin I (High Sensitivity) 10 <18 ng/L    Comment: (NOTE) Elevated high sensitivity troponin I (hsTnI) values and significant  changes across serial measurements  may suggest ACS but many other  chronic and acute conditions are known to elevate hsTnI results.  Refer to the "Links" section for chest pain algorithms and additional  guidance. Performed at [facility], [place] [address] [place], [place] [address]   Type and screen [place]     Status: None   Collection Time: 02/07/22  8:30 PM  Result Value Ref Range   ABO/RH(D) O NEG    Antibody Screen NEG    Sample Expiration      02/10/2022,2359 Performed at [facility], [place] [address] [place], [place] [address]   I-stat chem 8, ED (not at [facility] or [facility])     Status: Abnormal   Collection Time: 02/07/22  8:31 PM  Result Value Ref Range   Sodium 131 (L) 135 - 145 mmol/L   Potassium 3.9 3.5 - 5.1 mmol/L   Chloride 95 (L) 98 - 111 mmol/L   BUN 15 8 - 23 mg/dL   Creatinine, Ser 0.50 (L) 0.61 - 1.24 mg/dL   Glucose, Bld 155 (H) 70 - 99 mg/dL    Comment: Glucose reference range applies only to samples taken after fasting for at least 8 hours.   Calcium, Ion 1.06 (L) 1.15 - 1.40 mmol/L   TCO2 22 22 - 32 mmol/L   Hemoglobin 13.9 13.0 - 17.0 g/dL   HCT 41.0 39.0 - 52.0 %  Urinalysis, Routine w reflex microscopic Urine, Clean Catch     Status: Abnormal   Collection Time: 02/07/22  9:14 PM  Result Value Ref Range   Color, Urine YELLOW YELLOW   APPearance CLEAR CLEAR   Specific Gravity, Urine 1.036 (H) 1.005 - 1.030   pH 6.0 5.0 - 8.0   Glucose, UA >=500 (A) NEGATIVE mg/dL   Hgb urine dipstick SMALL (A) NEGATIVE   Bilirubin Urine NEGATIVE NEGATIVE   Ketones, ur 20 (A) NEGATIVE mg/dL   Protein, ur 30 (A) NEGATIVE mg/dL   Nitrite NEGATIVE NEGATIVE   Leukocytes,Ua NEGATIVE NEGATIVE   RBC / HPF 0-5 0 - 5 RBC/hpf   WBC, UA 0-5 0 - 5 WBC/hpf   Bacteria, UA NONE SEEN NONE SEEN    Comment: Performed at [facility], [place] [address] [place], [place] [address]  Resp Panel by RT-PCR (Flu A&B, Covid) Anterior Nasal Swab      Status: None   Collection Time: 02/07/22  9:25 PM   Specimen: Anterior Nasal Swab  Result Value Ref Range   SARS Coronavirus 2 by RT PCR NEGATIVE NEGATIVE    Comment: (NOTE) SARS-CoV-2 target nucleic acids are NOT DETECTED.  The SARS-CoV-2 RNA is generally detectable in upper respiratory specimens during the acute phase of infection. The lowest concentration of SARS-CoV-2 viral copies this assay can detect is 138 copies/mL. A negative result does not preclude SARS-Cov-2 infection and should not be used as the sole basis for treatment or other patient management decisions. A negative result may occur with  improper specimen collection/handling, submission of specimen other than nasopharyngeal swab, presence of viral mutation(s) within the areas targeted by this assay, and inadequate number  of viral copies(<138 copies/mL). A negative result must be combined with clinical observations, patient history, and epidemiological information. The expected result is Negative.  Fact Sheet for Patients:  EntrepreneurPulse.com.au  Fact Sheet for Healthcare Providers:  IncredibleEmployment.be  This test is no t yet approved or cleared by the [place] FDA and  has been authorized for detection and/or diagnosis of SARS-CoV-2 by FDA under an Emergency Use Authorization (EUA). This EUA will remain  in effect (meaning this test can be used) for the duration of the COVID-19 declaration under Section 564(b)(1) of the Act, 21 U.S.C.section 360bbb-3(b)(1), unless the authorization is terminated  or revoked sooner.       Influenza A by PCR NEGATIVE NEGATIVE   Influenza B by PCR NEGATIVE NEGATIVE    Comment: (NOTE) The Xpert Xpress SARS-CoV-2/FLU/RSV plus assay is intended as an aid in the diagnosis of influenza from Nasopharyngeal swab specimens and should not be used as a sole basis for treatment. Nasal washings and aspirates are unacceptable for Xpert Xpress  SARS-CoV-2/FLU/RSV testing.  Fact Sheet for Patients: EntrepreneurPulse.com.au  Fact Sheet for Healthcare Providers: IncredibleEmployment.be  This test is not yet approved or cleared by the [place] FDA and has been authorized for detection and/or diagnosis of SARS-CoV-2 by FDA under an Emergency Use Authorization (EUA). This EUA will remain in effect (meaning this test can be used) for the duration of the COVID-19 declaration under Section 564(b)(1) of the Act, 21 U.S.C. section 360bbb-3(b)(1), unless the authorization is terminated or revoked.  Performed at [facility], [place] [address], [place], [place] [address]   Lactic acid, plasma     Status: None   Collection Time: 02/07/22 10:24 PM  Result Value Ref Range   Lactic Acid, Venous 1.3 0.5 - 1.9 mmol/L    Comment: Performed at [facility], [place] [address] [place], [place] [address]  CK     Status: None   Collection Time: 02/07/22 10:55 PM  Result Value Ref Range   Total CK 231 49 - 397 U/L    Comment: Performed at [facility], [place] [address], [place], [place] [address]  Troponin I (High Sensitivity)     Status: None   Collection Time: 02/07/22 10:55 PM  Result Value Ref Range   Troponin I (High Sensitivity) 14 <18 ng/L    Comment: (NOTE) Elevated high sensitivity troponin I (hsTnI) values and significant  changes across serial measurements may suggest ACS but many other  chronic and acute conditions are known to elevate hsTnI results.  Refer to the "Links" section for chest pain algorithms and additional  guidance. Performed at [facility], [place] [address], [place], [place] [address]     CT Angio Chest PE W and/or Wo Contrast  Result Date: 02/07/2022 CLINICAL DATA:  Pulmonary embolism (PE) suspected, high prob EXAM: CT ANGIOGRAPHY CHEST WITH CONTRAST TECHNIQUE: Multidetector CT imaging of the chest  was performed using the standard protocol during bolus administration of intravenous contrast. Multiplanar CT image reconstructions and MIPs were obtained to evaluate the vascular anatomy. RADIATION DOSE REDUCTION: This exam was performed according to the departmental dose-optimization program which includes automated exposure control, adjustment of the mA and/or kV according to patient size and/or use of iterative reconstruction technique. CONTRAST:  132m OMNIPAQUE IOHEXOL 350 MG/ML SOLN COMPARISON:  None Available. FINDINGS: Cardiovascular: Mild cardiomegaly. Scattered coronary artery and aortic calcifications. No aneurysm. Mediastinum/Nodes: No mediastinal, hilar, or axillary adenopathy. Trachea and esophagus are unremarkable. Thyroid unremarkable.  Lungs/Pleura: No pleural effusions.  Bibasilar atelectasis. Upper Abdomen: See abdominal CT report Musculoskeletal: Chest wall soft tissues are unremarkable. No acute bony abnormality. Review of the MIP images confirms the above findings. IMPRESSION: No evidence of pulmonary embolus. Bibasilar atelectasis. No acute cardiopulmonary disease. Electronically Signed   By: Rolm Baptise M.D.   On: 02/07/2022 23:32   CT ABDOMEN PELVIS W CONTRAST  Result Date: 02/07/2022 CLINICAL DATA:  Sepsis EXAM: CT ABDOMEN AND PELVIS WITH CONTRAST TECHNIQUE: Multidetector CT imaging of the abdomen and pelvis was performed using the standard protocol following bolus administration of intravenous contrast. RADIATION DOSE REDUCTION: This exam was performed according to the departmental dose-optimization program which includes automated exposure control, adjustment of the mA and/or kV according to patient size and/or use of iterative reconstruction technique. CONTRAST:  178m OMNIPAQUE IOHEXOL 350 MG/ML SOLN COMPARISON:  None Available. FINDINGS: Lower chest: Bibasilar atelectasis. Hepatobiliary: No focal hepatic abnormality. Gallbladder unremarkable. Pancreas: No focal abnormality or  ductal dilatation. Spleen: No focal abnormality.  Normal size. Adrenals/Urinary Tract: No adrenal abnormality. No focal renal abnormality. No stones or hydronephrosis. Urinary bladder is unremarkable. Stomach/Bowel: Areas of bowel thickening within the transverse colon and descending colon. There also several mid small bowel loops which are thick walled in the lower abdomen and upper pelvis. Stomach grossly unremarkable. No bowel obstruction. Vascular/Lymphatic: Scattered aortic calcifications. No evidence of aneurysm or adenopathy. Reproductive: No visible focal abnormality. Other: There is moderate free fluid around the liver, spleen, in the right paracolic gutter and in the pelvis. Moderate free air is noted. Exact source is difficult to determine. Fluid and gas noted near the thick walled small bowel loop in the lower abdomen/upper pelvis which is possibly the source. Musculoskeletal: No acute bony abnormality. IMPRESSION: Findings compatible with perforated bowel with moderate free fluid and free air. Exact source not definitely visualized. Possibilities include a thick walled small bowel loop in the lower abdomen/upper pelvis, likely mid small bowel for the transverse colon/descending colon. Bibasilar atelectasis. Aortic atherosclerosis. Critical Value/emergent results were called by telephone at the time of interpretation on 02/07/2022 at 11:30 pm to provider [facility], who verbally acknowledged these results. Electronically Signed   By: KRolm BaptiseM.D.   On: 02/07/2022 23:30   DG Abd Portable 2 Views  Result Date: 02/07/2022 CLINICAL DATA:  sepsis EXAM: PORTABLE ABDOMEN - 2 VIEW COMPARISON:  None Available. FINDINGS: Limited evaluation due to overlapping osseous structures and overlying soft tissues. Gaseous distension of the large bowel. Question gaseous dilatation of a focal loop of small bowel within the right lower abdomen. There is no evidence of free air. No radio-opaque calculi or other  significant radiographic abnormality is seen. IMPRESSION: Question gaseous dilatation of a focal loop of small bowel within the right lower abdomen. Limited evaluation due to overlapping osseous structures and overlying soft tissues. If clinically indicated, please consider CT abdomen pelvis with intravenous contrast for further evaluation. Electronically Signed   By: MIven FinnM.D.   On: 02/07/2022 21:28   DG Chest Port 1 View  Result Date: 02/07/2022 CLINICAL DATA:  Questionable sepsis - evaluate for abnormality EXAM: PORTABLE CHEST 1 VIEW COMPARISON:  Chest x-ray 06/13/2011 FINDINGS: The heart and mediastinal contours are unchanged. Low lung volumes with bibasilar airspace opacities likely representing atelectasis. No focal consolidation. No pulmonary edema. No pleural effusion. No pneumothorax. No acute osseous abnormality. IMPRESSION: Low lung volumes with no definite acute cardiopulmonary abnormality. Electronically Signed   By: MIven FinnM.D.   On:  02/07/2022 21:19    CT images, ED reports, recent colonoscopy and CT reports reviewed in planning for surgery and treatment afterwards.  CCM also contacted for post operative care.    A/P: Richard [name] is an 66 y.o. male with recent colonoscopy and diagnosis of IBD with severe inflammation of the entire colon with skip lesions and sparing of the TI.  Pt has been on prednisone since 11/20.  CT scan today shows perforated bowel with free fluid and air.  Pt with mild peritonitis on physical exam with significant tachycardia, fevers and decreased wbc of 2.2.  Pt was determined to have a life threatening condition with sepsis and was given IV antibiotics.  I recommended emergent surgery with ex lap, probable bowel resection and probable ostomy.  Significant risk of continued infection, leak of resected margins, hernia and systemic damage to lungs and kidneys were discussed with the patient.  We also discussed the possibility of doing a total or  subtotal colectomy depending on the state of his inflammation.  All questions were answered.    The surgery and anatomy were described to the patient as well as the risks of surgery and the possible complications.  These include: Bleeding, deep abdominal infections and possible wound complications such as hernia and infection, damage to adjacent structures, possible need for other procedures, such as abscess drains in radiology, possible prolonged hospital stay, prolonged fatigue/weakness or appetite loss, possible complications of their medical problems such as heart disease or arrhythmias or lung problems, death. I believe the patient understands and wishes to proceed with the surgery.      Rosario Adie, MD  Colorectal and General Surgery [facility] Surgery   high medical decision making

## 2022-02-08 NOTE — Transfer of Care (Signed)
Immediate Anesthesia Transfer of Care Note  Patient: Richard [name]  Procedure(s) Performed: EXPLORATORY LAPAROTOMY, COLON RESECTION, OSTOMY (Abdomen)  Patient Location: PACU  Anesthesia Type:General  Level of Consciousness: awake, alert , and oriented  Airway & Oxygen Therapy: Patient Spontanous Breathing and Patient connected to face mask oxygen  Post-op Assessment: Report given to RN and Post -op Vital signs reviewed and stable  Post vital signs: Reviewed and stable  Last Vitals:  Vitals Value Taken Time  BP 114/80 02/08/22 0315  Temp 36.3 C 02/08/22 0306  Pulse 109 02/08/22 0318  Resp 14 02/08/22 0318  SpO2 97 % 02/08/22 0318  Vitals shown include unvalidated device data.  Last Pain:  Vitals:   02/07/22 2242  TempSrc: Oral  PainSc:          Complications: No notable events documented.

## 2022-02-08 NOTE — Op Note (Signed)
02/07/2022 - 02/08/2022  2:57 AM  PATIENT:  Richard [name]  66 y.o. male  Patient Care Team: [name], MD as PCP - General (Family Medicine) Lyndal Pulley, DO as Consulting Physician (Sports Medicine) Druscilla Brownie, MD as Consulting Physician (Dermatology) Idolina Primer, Warnell Bureau (Optometry) Alda Berthold, DO as Consulting Physician (Neurology) Maczis, Carlena Hurl, PA-C as Physician Assistant (General Surgery) Leighton Ruff, MD as Consulting Physician (General Surgery)  PRE-OPERATIVE DIAGNOSIS:  perforated viscus  POST-OPERATIVE DIAGNOSIS:  perforated sigmoid colon  PROCEDURE:  EXPLORATORY LAPAROTOMY, [place], DESCENDING AND SIGMOID COLON RESECTION, TRANSVERSE COLOSTOMY    Surgeon(s): Leighton Ruff, MD  ASSISTANT: none   ANESTHESIA:   general  EBL:125m  Total I/O In: 4500 [I.V.:4000; IV Piggyback:500] Out: -   DRAINS: none   SPECIMEN:  Source of Specimen:  Descending and Sigmoid colon  DISPOSITION OF SPECIMEN:  PATHOLOGY  COUNTS:  YES  PLAN OF CARE: Admit to inpatient   PATIENT DISPOSITION:  ICU - extubated and stable.  INDICATION: 66year old male status post recent diagnosis of Crohn's disease on a steroid taper who developed worsening abdominal pain and presented to the emergency department with sepsis.  CT showed perforated viscus.  Exploratory laparotomy was recommended.  Consent was signed and placed on chart.   OR FINDINGS: Pinhole perforation in the sigmoid colon with significant inflammation of the descending and sigmoid colon  DESCRIPTION: the patient was identified in the preoperative holding area and taken to the OR where they were laid supine on the operating room table.  General anesthesia was induced without difficulty. SCDs were also noted to be in place prior to the initiation of anesthesia.  The patient was then prepped and draped in the usual sterile fashion.   A surgical timeout was performed indicating the  correct patient, procedure, positioning and need for preoperative antibiotics.   I made an incision at midline using a 10 blade scalpel.  Dissection was carried down through the subcutaneous tissues using electrocautery.  The peritoneum was entered.  Upon entering the abdomen (organ space), I encountered purulent ascites throughout the abdomen.  There was bile staining noted in the left lower quadrant.  There was no obvious source of perforation upon aspiration of the ascites.  I began by evaluating the entire colon.  The pelvis appeared normal.  There was significant thickening of the sigmoid colon and descending colon.  This was bluntly mobilized.  The remainder of the transverse colon and ascending colon were inspected as well.  There was no sign of inflammation in these areas.  The entire small bowel was ran from the terminal ileum to the ligament of Treitz.  There was significant bile staining on the bowel but no inflammation or perforation was noted.  I evaluated the stomach.  Confirmed placement of the NG tube into the body of the stomach.  There was no sign of perforated gastric or duodenal ulcer.  The pylorus was palpated and felt to be normal.  I turned my attention to mobilizing the left colon.  I used electrocautery to divide the paracolic gutter up the left side of the abdomen.  The entire splenic flexure was then mobilized using blunt dissection and electrocautery.  The omentum was taken down from the transverse colon to allow for complete visualization.  Once this was complete and the colon was able to be mobilized out of the abdomen, we identified a small pinhole perforation in the proximal sigmoid colon.  Given the significant inflammation in the  descending colon I decided to resect this as well as the splenic flexure to allow for a well-perfused ostomy.  The colon was divided using a 75 mm GIA blue load stapler.  The mesentery was then divided using the LigaSure device down to the level of the  distal sigmoid colon which was soft and noninflamed.  I divided this using a second 75 mm GIA blue load stapler.  The remaining mesentery was also divided using the LigaSure.  The specimen was marked and sent to pathology for further examination.  Once this was complete, the abdomen was irrigated with approximately 5 L of warm normal saline.  The splenic flexure and left paracolic gutter were inspected for hemostasis.  There was no sign of active bleeding after warm irrigation.  At this point an ostomy aperture was created in the upper portion of the left abdomen.  I identified the rectus muscle and divided the skin over top of this.  Dissection was carried down through the subcutaneous tissues using electrocautery.  The fascia was divided in a cruciate manner.  The rectus muscle was then split and the peritoneum was divided longitudinally.  The transverse colon was brought out through this ostomy site and secured into place.  I then closed the fascia of the midline wound using #0 running PDS sutures with interrupted #1 Novafil internal retention sutures.  The skin was left open and packed.  The colostomy was matured in standard [place] fashion using interrupted 2-0 Vicryl sutures.  The patient was then awakened from anesthesia and sent to the postanesthesia esthesia care unit in stable condition.  He will be admitted to the ICU for ongoing care and resuscitation.    Rosario Adie, MD  Colorectal and [place] Surgery

## 2022-02-09 DIAGNOSIS — K529 Noninfective gastroenteritis and colitis, unspecified: Secondary | ICD-10-CM | POA: Diagnosis not present

## 2022-02-09 DIAGNOSIS — A419 Sepsis, unspecified organism: Secondary | ICD-10-CM

## 2022-02-09 DIAGNOSIS — G9341 Metabolic encephalopathy: Secondary | ICD-10-CM

## 2022-02-09 DIAGNOSIS — R6520 Severe sepsis without septic shock: Secondary | ICD-10-CM

## 2022-02-09 DIAGNOSIS — R198 Other specified symptoms and signs involving the digestive system and abdomen: Secondary | ICD-10-CM

## 2022-02-09 DIAGNOSIS — K668 Other specified disorders of peritoneum: Secondary | ICD-10-CM | POA: Diagnosis not present

## 2022-02-09 LAB — URINALYSIS, ROUTINE W REFLEX MICROSCOPIC
Bilirubin Urine: NEGATIVE
Glucose, UA: >=500 mg/dL — AB
Hgb urine dipstick: NEGATIVE
Ketones, ur: 20 mg/dL — AB
Leukocytes,Ua: NEGATIVE
Nitrite: NEGATIVE
Protein, ur: 30 mg/dL — AB
Specific Gravity, Urine: 1.041 — ABNORMAL HIGH (ref 1.005–1.030)
pH: 5 (ref 5.0–8.0)

## 2022-02-09 LAB — GLUCOSE, CAPILLARY
Glucose-Capillary: 153 mg/dL — ABNORMAL HIGH (ref 70–99)
Glucose-Capillary: 151 mg/dL — ABNORMAL HIGH (ref 70–99)
Glucose-Capillary: 152 mg/dL — ABNORMAL HIGH (ref 70–99)
Glucose-Capillary: 138 mg/dL — ABNORMAL HIGH (ref 70–99)
Glucose-Capillary: 152 mg/dL — ABNORMAL HIGH (ref 70–99)

## 2022-02-09 LAB — URINE CULTURE: Culture: 1000 — AB

## 2022-02-09 LAB — CBC
HCT: 29 % — ABNORMAL LOW (ref 39.0–52.0)
Hemoglobin: 10.1 g/dL — ABNORMAL LOW (ref 13.0–17.0)
MCH: 34.4 pg — ABNORMAL HIGH (ref 26.0–34.0)
MCHC: 34.8 g/dL (ref 30.0–36.0)
MCV: 98.6 fL (ref 80.0–100.0)
Platelets: 320 10*3/uL (ref 150–400)
RBC: 2.94 MIL/uL — ABNORMAL LOW (ref 4.22–5.81)
RDW: 13.3 % (ref 11.5–15.5)
WBC: 10.1 10*3/uL (ref 4.0–10.5)
nRBC: 0 % (ref 0.0–0.2)

## 2022-02-09 LAB — BASIC METABOLIC PANEL
Anion gap: 10 (ref 5–15)
BUN: 22 mg/dL (ref 8–23)
CO2: 28 mmol/L (ref 22–32)
Calcium: 7.5 mg/dL — ABNORMAL LOW (ref 8.9–10.3)
Chloride: 100 mmol/L (ref 98–111)
Creatinine, Ser: 0.68 mg/dL (ref 0.61–1.24)
GFR, Estimated: >60 mL/min (ref >60)
Glucose, Bld: 145 mg/dL — ABNORMAL HIGH (ref 70–99)
Potassium: 3.8 mmol/L (ref 3.5–5.1)
Sodium: 138 mmol/L (ref 135–145)

## 2022-02-09 MED ORDER — LACTATED RINGERS IV SOLN: INTRAVENOUS | Status: AC

## 2022-02-09 NOTE — Progress Notes (Signed)
NGT still in place. No cpap for tonight.

## 2022-02-09 NOTE — Anesthesia Postprocedure Evaluation (Signed)
Anesthesia Post Note  Patient: Richard [name]  Procedure(s) Performed: EXPLORATORY LAPAROTOMY, COLON RESECTION, OSTOMY (Abdomen)     Patient location during evaluation: PACU Anesthesia Type: General Level of consciousness: awake and alert Pain management: pain level controlled Vital Signs Assessment: post-procedure vital signs reviewed and stable Respiratory status: spontaneous breathing, nonlabored ventilation, respiratory function stable and patient connected to nasal cannula oxygen Cardiovascular status: blood pressure returned to baseline and stable Postop Assessment: no apparent nausea or vomiting Anesthetic complications: no  No notable events documented.  Last Vitals:  Vitals:   02/09/22 0600 02/09/22 0700  BP: 137/70 (!) 145/68  Pulse: (!) 117 (!) 107  Resp: (!) 25 16  Temp:    SpO2: 94% 94%    Last Pain:  Vitals:   02/09/22 0350  TempSrc: Oral  PainSc:                  Richard [name]

## 2022-02-09 NOTE — Progress Notes (Addendum)
Daily Progress Note  Hospital Day: 3  Chief Complaint: new IBD, bowel perforation  Brief History 66 y.o. male with a pmh not limited to DM, HTN, HLLDADD  Assessment:  # 32 male with newly diagnosed IBD ( probably Crohn's colitis) with bowel perforation s/p exploratory lap, sigmoid resection and transverse colostomy on 11/23 Today's labs reviewed: Hgb stable at 10.1, WBC 10.1. He has been macrocytic with MCV of ~ 103, Renal function normal, K+ normal.  Temp 99, slightly tachycardic, normotensive ~ 400 ml NGT output in cannister, + small amount of stool in ostomy bag.  Overall he feels okay. No significant abdominal pain    # Urine dark, appears concentrated.  He is NPO, not getting IVF. Will let TRH know I am starting starting LR at 100 ml /hr   Plan:    Await colon biopsies On Zosyn Continue NGT. He is taking some ice chips Hopefully will get return of bowel function soon and TNA will not be necessary.  Once biopsies are back will think about long term treatment plan for what is likely IBD  Subjective   Overall feels okay. No significant abdominal pain   Objective   Endoscopic studies:  02/04/22 colonoscopy -Preparation of the colon was fair, but adequate for the purposes of this study. - Anal fissure found on perianal exam. - Diffuse severe inflammation was found in the entire examined colon secondary to Crohn's disease with colonic involvement. Biopsied. - The examined portion of the ileum was normal. Biopsied  Imaging:  CT Angio Chest PE W and/or Wo Contrast  Result Date: 02/07/2022 CLINICAL DATA:  Pulmonary embolism (PE) suspected, high prob EXAM: CT ANGIOGRAPHY CHEST WITH CONTRAST TECHNIQUE: Multidetector CT imaging of the chest was performed using the standard protocol during bolus administration of intravenous contrast. Multiplanar CT image reconstructions and MIPs were obtained to evaluate the vascular anatomy. RADIATION DOSE REDUCTION: This exam was  performed according to the departmental dose-optimization program which includes automated exposure control, adjustment of the mA and/or kV according to patient size and/or use of iterative reconstruction technique. CONTRAST:  137m OMNIPAQUE IOHEXOL 350 MG/ML SOLN COMPARISON:  None Available. FINDINGS: Cardiovascular: Mild cardiomegaly. Scattered coronary artery and aortic calcifications. No aneurysm. Mediastinum/Nodes: No mediastinal, hilar, or axillary adenopathy. Trachea and esophagus are unremarkable. Thyroid unremarkable. Lungs/Pleura: No pleural effusions.  Bibasilar atelectasis. Upper Abdomen: See abdominal CT report Musculoskeletal: Chest wall soft tissues are unremarkable. No acute bony abnormality. Review of the MIP images confirms the above findings. IMPRESSION: No evidence of pulmonary embolus. Bibasilar atelectasis. No acute cardiopulmonary disease. Electronically Signed   By: KRolm BaptiseM.D.   On: 02/07/2022 23:32   CT ABDOMEN PELVIS W CONTRAST  Result Date: 02/07/2022 CLINICAL DATA:  Sepsis EXAM: CT ABDOMEN AND PELVIS WITH CONTRAST TECHNIQUE: Multidetector CT imaging of the abdomen and pelvis was performed using the standard protocol following bolus administration of intravenous contrast. RADIATION DOSE REDUCTION: This exam was performed according to the departmental dose-optimization program which includes automated exposure control, adjustment of the mA and/or kV according to patient size and/or use of iterative reconstruction technique. CONTRAST:  1037mOMNIPAQUE IOHEXOL 350 MG/ML SOLN COMPARISON:  None Available. FINDINGS: Lower chest: Bibasilar atelectasis. Hepatobiliary: No focal hepatic abnormality. Gallbladder unremarkable. Pancreas: No focal abnormality or ductal dilatation. Spleen: No focal abnormality.  Normal size. Adrenals/Urinary Tract: No adrenal abnormality. No focal renal abnormality. No stones or hydronephrosis. Urinary bladder is unremarkable. Stomach/Bowel: Areas of bowel  thickening within the transverse colon and descending colon.  There also several mid small bowel loops which are thick walled in the lower abdomen and upper pelvis. Stomach grossly unremarkable. No bowel obstruction. Vascular/Lymphatic: Scattered aortic calcifications. No evidence of aneurysm or adenopathy. Reproductive: No visible focal abnormality. Other: There is moderate free fluid around the liver, spleen, in the right paracolic gutter and in the pelvis. Moderate free air is noted. Exact source is difficult to determine. Fluid and gas noted near the thick walled small bowel loop in the lower abdomen/upper pelvis which is possibly the source. Musculoskeletal: No acute bony abnormality. IMPRESSION: Findings compatible with perforated bowel with moderate free fluid and free air. Exact source not definitely visualized. Possibilities include a thick walled small bowel loop in the lower abdomen/upper pelvis, likely mid small bowel for the transverse colon/descending colon. Bibasilar atelectasis. Aortic atherosclerosis. Critical Value/emergent results were called by telephone at the time of interpretation on 02/07/2022 at 11:30 pm to provider [facility] , who verbally acknowledged these results. Electronically Signed   By: Rolm Baptise M.D.   On: 02/07/2022 23:30   DG Abd Portable 2 Views  Result Date: 02/07/2022 CLINICAL DATA:  sepsis EXAM: PORTABLE ABDOMEN - 2 VIEW COMPARISON:  None Available. FINDINGS: Limited evaluation due to overlapping osseous structures and overlying soft tissues. Gaseous distension of the large bowel. Question gaseous dilatation of a focal loop of small bowel within the right lower abdomen. There is no evidence of free air. No radio-opaque calculi or other significant radiographic abnormality is seen. IMPRESSION: Question gaseous dilatation of a focal loop of small bowel within the right lower abdomen. Limited evaluation due to overlapping osseous structures and overlying soft  tissues. If clinically indicated, please consider CT abdomen pelvis with intravenous contrast for further evaluation. Electronically Signed   By: Iven Finn M.D.   On: 02/07/2022 21:28   DG Chest Port 1 View  Result Date: 02/07/2022 CLINICAL DATA:  Questionable sepsis - evaluate for abnormality EXAM: PORTABLE CHEST 1 VIEW COMPARISON:  Chest x-ray 06/13/2011 FINDINGS: The heart and mediastinal contours are unchanged. Low lung volumes with bibasilar airspace opacities likely representing atelectasis. No focal consolidation. No pulmonary edema. No pleural effusion. No pneumothorax. No acute osseous abnormality. IMPRESSION: Low lung volumes with no definite acute cardiopulmonary abnormality. Electronically Signed   By: Iven Finn M.D.   On: 02/07/2022 21:19    Lab Results: Recent Labs    02/07/22 2030 02/07/22 2031 02/08/22 0726 02/09/22 0246  WBC 2.2*  --  7.0 10.1  HGB 12.9* 13.9 10.5* 10.1*  HCT 38.1* 41.0 31.7* 29.0*  PLT 404*  --  302 320   BMET Recent Labs    02/07/22 2030 02/07/22 2031 02/08/22 0726 02/09/22 0246  NA 133* 131* 134* 138  K 4.0 3.9 4.4 3.8  CL 96* 95* 104 100  CO2 19*  --  16* 28  GLUCOSE 155* 155* 158* 145*  BUN '16 15 17 22  '$ CREATININE 0.67 0.50* 0.65 0.68  CALCIUM 8.3*  --  7.3* 7.5*   LFT Recent Labs    02/08/22 0726  PROT 4.7*  ALBUMIN 1.8*  AST 22  ALT 18  ALKPHOS 44  BILITOT 1.6*   PT/INR Recent Labs    02/07/22 2030  LABPROT 15.8*  INR 1.3*   01/20/22 Fecal calprotectin 3,570  Scheduled inpatient medications:   chlorhexidine  15 mL Mouth/Throat Once   Chlorhexidine Gluconate Cloth  6 each Topical Daily   insulin aspart  0-20 Units Subcutaneous Q4H   mouth rinse  15 mL  Mouth Rinse 4 times per day   Continuous inpatient infusions:   piperacillin-tazobactam (ZOSYN)  IV Stopped (02/09/22 0757)   PRN inpatient medications: HYDROmorphone (DILAUDID) injection, ondansetron (ZOFRAN) IV, mouth rinse, mouth rinse  Vital signs in  last 24 hours: Temp:  [98.5 F (36.9 C)-100.2 F (37.9 C)] 100.2 F (37.9 C) (11/25 0350) Pulse Rate:  [107-130] 107 (11/25 0700) Resp:  [16-25] 16 (11/25 0700) BP: (111-145)/(59-86) 145/68 (11/25 0700) SpO2:  [88 %-95 %] 94 % (11/25 0700) Last BM Date :  (PTA)  Intake/Output Summary (Last 24 hours) at 02/09/2022 0919 Last data filed at 02/09/2022 0700 Gross per 24 hour  Intake 3495.05 ml  Output 4450 ml  Net -954.95 ml    Intake/Output from previous day: 11/24 0701 - 11/25 0700 In: 3882.6 [P.O.:2400; I.V.:1391.4; IV Piggyback:91.2] Out: 4450 [Urine:800; Emesis/NG output:3650] Intake/Output this shift: No intake/output data recorded.   Physical Exam:  General: Alert male in NAD. NGT with ~ 400 ml dark brown drainage in cannister.  Heart:  Sinus tachycardia. No lower extremity edema Pulmonary: Normal respiratory effort Abdomen: Soft, nondistended, nontender. A few bowel sounds present . Small amount of green output in ostomy bag  Neurologic: Alert and oriented Psych: Pleasant. Cooperative.    Principal Problem:   Perforated abdominal viscus     LOS: 1 day   Richard [name] ,NP 02/09/2022, 9:19 AM    I have taken an interval history, thoroughly reviewed the chart and examined the patient. I agree with the Advanced Practitioner's note, impression and recommendations, and have recorded additional findings, impressions and recommendations below. I performed a substantive portion of this encounter (>50% time spent), including a complete performance of the medical decision making.  My additional thoughts are as follows:  Signout received from Dr. Rush Landmark, extensive chart review performed. The APP and I saw this patient together.  Newly diagnosed IBD colitis, in my opinion the appearance favors a Crohn's morphology. Status post partial colectomy (descending and sigmoid) and transverse colostomy with rectal pouch for perforation  Clinically stable on bowel rest, NG  tube drainage, antibiotics.  Persistently tachycardic, though expected with this physiologic stress.  We had a long conversation with him and his wife.  Eventual treatment plan for his colitis on hold while we await pathology results and allow more time for recovery before he starts any immunosuppressive therapy such as steroids or anti-TNF agent.  We noted he was on no IV fluids, his oral intake has been poor, so we started him on normal saline and message his hospitalist about this.  In addition, it sounds like the patient has had little or no nutrition since his colonoscopy on 02/04/2022 because he was feeling poorly.  The day after tomorrow will be 7 days from the colonoscopy, and if his postop ileus is not yet sufficiently improved to allow consideration of oral intake, he would benefit from TPN (already has a midline catheter).  We will follow him throughout his hospital stay.  Nelida Meuse III Office:[phone]

## 2022-02-09 NOTE — Progress Notes (Signed)
PROGRESS NOTE    Richard [name]  KTG:[id number] DOB: 03/15/1956 DOA: 02/07/2022 PCP: [name], MD     Brief Narrative:  66 y/o WM, PMHx Scrotal abscess August 2023, Colon polyp, Diverticulosis, ADHD, Anxiety, Rhinitis, COVID July 2022, HLD, HTN, OSA, PNA, Seborrheic dermatitis, Hypogonadism, DM type 2, Peripheral neuropathy, former smoker,   Developed diarrhea about 3 to 4 weeks prior to admission.  He was seen by Dr. [name] with GI.  He had colonoscopy on 02/04/22.  This showed diffuse, severe inflammation throughout the colon with mucosal friability and biopsies were performed.  He was found to have Crohn's colitis and started on prednisone.  He started having discomfort in his abdomen on 02/06/22.  This started in his lower abdomen and migrated toward his upper abdomen.  He started feeling bloated as well.  He had trouble getting off the commode due to feeling weak "like he was frozen".  His wife called EMS and he was brought to the ER.  He had fever 102.1 F with tachycardia.  CT abd/pelvis showed findings compatible with perforated bowel with moderate free fluid and free air.  He was started on antibiotics and IV fluids.  Surgery consulted to assess for emergent laparotomy.  PCCM consulted to admit to ICU.    Subjective: 11/25 A/O x4, states negative pain.  Big concern is that he cannot drink water or tea at this time.   Assessment & Plan: Covid vaccination;   Principal Problem:   Perforated abdominal viscus  Acute peritonitis with sepsis from perforated abdominal viscus -11/24. s/p emergent laparotomy Recent colonoscopy with diagnosis of Crohn's colitis.   -NPO x ice chips -NGT in place LIS -PRN dilaudid for pain  -continue zosyn -follow culture results  -[facility] consulted to assist in ongoing management of Chron's Colitis  -Lactated Ringer's 198m/hr   At Risk AKI  In setting of contrast administration -remove foley if sr cr remains stable  -Strict in and  out - Daily weight -Avoid nephrotoxic agents, ensure adequate renal perfusion   Essential HTN - Hold lisinopril  HLD -hold Lipitor    DM type 2 poorly controlled with hyperglycemia. -11/8 Hemoglobin A1c= 6.6 -hold outpatient metformin, jardiance CBG (last 3)  Recent Labs    02/09/22 0006 02/09/22 0339 02/09/22 0853  GLUCAP 153* 151* 152*  -Resistant SSI   OSA Baseline CPAP 10 cm H20, followed by Dr. OAnder Slade-CPAP QSH   -NGT may impede mask seal    Hx of ADHD, Anxiety. -hold home methylphenidate    Hx of Hypogonadism. -hold outpatient testosterone  -11/25 testosterone level pending   Moderate Protein Calorie Malnutrition (POA) -nutrition when ok per CCS   Obesity (BMI 35.7 to m/kg) -Address as outpatient     Mobility Assessment (last 72 hours)     Mobility Assessment   No documentation.                    DVT prophylaxis: SCD Code Status: Full Family Communication: 11/25 wife at bedside for discussion of plan of care all questions answered Status is: Inpatient    Dispo: The patient is from: Home              Anticipated d/c is to: Home              Anticipated d/c date is: > 3 days              Patient currently is not medically stable to d/c.  Consultants:  PCCM CCS    Procedures/Significant Events:  11/23 s/p exploratory lap, sigmoid resection and transverse colostomy      I have personally reviewed and interpreted all radiology studies and my findings are as above.  VENTILATOR SETTINGS:    Cultures 11/23 ex lap Path pending 11/25 urine pending    Antimicrobials: Anti-infectives (From admission, onward)    Start     Dose/Rate Route Frequency Ordered Stop   02/08/22 1200  piperacillin-tazobactam (ZOSYN) IVPB 3.375 g        3.375 g 12.5 mL/hr over 240 Minutes Intravenous Every 8 hours 02/08/22 0505     02/08/22 0500  piperacillin-tazobactam (ZOSYN) IVPB 3.375 g  Status:  Discontinued        3.375 g 12.5  mL/hr over 240 Minutes Intravenous Every 8 hours 02/08/22 0006 02/08/22 0505   02/08/22 0334  piperacillin-tazobactam (ZOSYN) 3.375 GM/50ML IVPB       Note to Pharmacy: Enis Gash W: cabinet override      02/08/22 0334 02/08/22 0345   02/07/22 2030  ceFEPIme (MAXIPIME) 2 g in sodium chloride 0.9 % 100 mL IVPB        2 g 200 mL/hr over 30 Minutes Intravenous  Once 02/07/22 2023 02/07/22 2248   02/07/22 2030  metroNIDAZOLE (FLAGYL) IVPB 500 mg        500 mg 100 mL/hr over 60 Minutes Intravenous  Once 02/07/22 2023 02/08/22 0506   02/07/22 2030  vancomycin (VANCOCIN) IVPB 1000 mg/200 mL premix        1,000 mg 200 mL/hr over 60 Minutes Intravenous  Once 02/07/22 2023 02/08/22 0506         Devices    LINES / TUBES:      Continuous Infusions:  piperacillin-tazobactam (ZOSYN)  IV Stopped (02/09/22 0757)     Objective: Vitals:   02/09/22 0400 02/09/22 0500 02/09/22 0600 02/09/22 0700  BP: 136/73 137/78 137/70 (!) 145/68  Pulse: (!) 121 (!) 119 (!) 117 (!) 107  Resp: 19 (!) 23 (!) 25 16  Temp:      TempSrc:      SpO2: 94% 93% 94% 94%  Weight:      Height:        Intake/Output Summary (Last 24 hours) at 02/09/2022 5621 Last data filed at 02/09/2022 0700 Gross per 24 hour  Intake 3495.05 ml  Output 4450 ml  Net -954.95 ml   Filed Weights   02/08/22 0500  Weight: 109.1 kg    Examination:  General: A/O x4, No acute respiratory distress Eyes: negative scleral hemorrhage, negative anisocoria, negative icterus ENT: Negative Runny nose, negative gingival bleeding, Neck:  Negative scars, masses, torticollis, lymphadenopathy, JVD Lungs: Clear to auscultation bilaterally without wheezes or crackles Cardiovascular: Tachycardic without murmur gallop or rub normal S1 and S2 Abdomen: negative abdominal pain, nondistended, positive soft, bowel sounds, no rebound, no ascites, no appreciable mass, midline ex lap incision covered in clean no sign of leakage.  Ostomy pouch in  place. Extremities: No significant cyanosis, clubbing, or edema bilateral lower extremities Skin: Negative rashes, lesions, ulcers Psychiatric:  Negative depression, negative anxiety, negative fatigue, negative mania  Central nervous system:  Cranial nerves II through XII intact, tongue/uvula midline, all extremities muscle strength 5/5, sensation intact throughout, negative dysarthria, negative expressive aphasia, negative receptive aphasia.  .     Data Reviewed: Care during the described time interval was provided by me .  I have reviewed this patient's available data, including medical history, events of note,  physical examination, and all test results as part of my evaluation.  CBC: Recent Labs  Lab 02/04/22 1038 02/07/22 2030 02/07/22 2031 02/08/22 0726 02/09/22 0246  WBC 4.4 2.2*  --  7.0 10.1  NEUTROABS 3.1 1.8  --   --   --   HGB 9.9* 12.9* 13.9 10.5* 10.1*  HCT 28.8* 38.1* 41.0 31.7* 29.0*  MCV 102.4* 101.6*  --  102.9* 98.6  PLT 263.0 404*  --  302 185   Basic Metabolic Panel: Recent Labs  Lab 02/07/22 2030 02/07/22 2031 02/08/22 0726 02/09/22 0246  NA 133* 131* 134* 138  K 4.0 3.9 4.4 3.8  CL 96* 95* 104 100  CO2 19*  --  16* 28  GLUCOSE 155* 155* 158* 145*  BUN '16 15 17 22  '$ CREATININE 0.67 0.50* 0.65 0.68  CALCIUM 8.3*  --  7.3* 7.5*   GFR: Estimated Creatinine Clearance: 112.1 mL/min (by C-G formula based on SCr of 0.68 mg/dL). Liver Function Tests: Recent Labs  Lab 02/07/22 2030 02/08/22 0726  AST 40 22  ALT 29 18  ALKPHOS 87 44  BILITOT 1.4* 1.6*  PROT 7.2 4.7*  ALBUMIN 2.3* 1.8*   No results for input(s): "LIPASE", "AMYLASE" in the last 168 hours. No results for input(s): "AMMONIA" in the last 168 hours. Coagulation Profile: Recent Labs  Lab 02/07/22 2030  INR 1.3*   Cardiac Enzymes: Recent Labs  Lab 02/07/22 2255  CKTOTAL 231   BNP (last 3 results) No results for input(s): "PROBNP" in the last 8760 hours. HbA1C: No results for  input(s): "HGBA1C" in the last 72 hours. CBG: Recent Labs  Lab 02/08/22 1558 02/08/22 2009 02/09/22 0006 02/09/22 0339 02/09/22 0853  GLUCAP 166* 157* 153* 151* 152*   Lipid Profile: No results for input(s): "CHOL", "HDL", "LDLCALC", "TRIG", "CHOLHDL", "LDLDIRECT" in the last 72 hours. Thyroid Function Tests: No results for input(s): "TSH", "T4TOTAL", "FREET4", "T3FREE", "THYROIDAB" in the last 72 hours. Anemia Panel: No results for input(s): "VITAMINB12", "FOLATE", "FERRITIN", "TIBC", "IRON", "RETICCTPCT" in the last 72 hours. Sepsis Labs: Recent Labs  Lab 02/07/22 2030 02/07/22 2224  LATICACIDVEN 2.3* 1.3    Recent Results (from the past 240 hour(s))  Urine Culture     Status: None (Preliminary result)   Collection Time: 02/07/22  9:14 AM   Specimen: In/Out Cath Urine  Result Value Ref Range Status   Specimen Description   Final    IN/OUT CATH URINE Performed at [facility], [place] [address], [place], [place] [address]    Special Requests   Final    NONE Performed at [facility], [place] [address], [place], [place] [address]    Culture   Final    CULTURE REINCUBATED FOR BETTER GROWTH Performed at [facility], [place] [address], [place], [place] [address]    Report Status PENDING  Incomplete  Blood Culture (routine x 2)     Status: None (Preliminary result)   Collection Time: 02/07/22  8:29 PM   Specimen: BLOOD  Result Value Ref Range Status   Specimen Description   Final    BLOOD LEFT ANTECUBITAL Performed at [place] [address], [place], [place] [address]    Special Requests   Final    BOTTLES DRAWN AEROBIC AND ANAEROBIC Blood Culture results may not be optimal due to an excessive volume of blood received in culture bottles Performed at [place] [address], [place],  [address]  Culture   Final    NO GROWTH 2 DAYS Performed at [facility], [place] [address], [place], [place] [address]    Report Status PENDING  Incomplete  Blood Culture (routine x 2)     Status: None (Preliminary result)   Collection Time: 02/07/22  8:30 PM   Specimen: BLOOD LEFT ARM  Result Value Ref Range Status   Specimen Description   Final    BLOOD LEFT ARM Performed at [facility], [place] [address], [place], [place] [address]    Special Requests   Final    BOTTLES DRAWN AEROBIC AND ANAEROBIC Blood Culture adequate volume Performed at [place] [address] [place], [place] [address]    Culture   Final    NO GROWTH 2 DAYS Performed at [place] [address] [place],  [address]    Report Status PENDING  Incomplete  Resp Panel by RT-PCR (Flu A&B, Covid) Anterior Nasal Swab     Status: None   Collection Time: 02/07/22  9:25 PM   Specimen: Anterior Nasal Swab  Result Value Ref Range Status   SARS Coronavirus 2 by RT PCR NEGATIVE NEGATIVE Final    Comment: (NOTE) SARS-CoV-2 target nucleic acids are NOT DETECTED.  The SARS-CoV-2 RNA is generally detectable in upper respiratory specimens during the acute phase of infection. The lowest concentration of SARS-CoV-2 viral copies this assay can detect is 138 copies/mL. A negative result does not preclude SARS-Cov-2 infection and should not be used as the sole basis for treatment or other patient management decisions. A negative result may occur with  improper specimen collection/handling, submission of specimen other than nasopharyngeal swab, presence of viral mutation(s) within the areas targeted by this assay, and inadequate number of viral copies(<138 copies/mL). A negative result must be combined with clinical observations, patient history, and epidemiological information. The expected result is Negative.  Fact Sheet for Patients:  EntrepreneurPulse.com.au  Fact Sheet for Healthcare Providers:   IncredibleEmployment.be  This test is no t yet approved or cleared by the [place] FDA and  has been authorized for detection and/or diagnosis of SARS-CoV-2 by FDA under an Emergency Use Authorization (EUA). This EUA will remain  in effect (meaning this test can be used) for the duration of the COVID-19 declaration under Section 564(b)(1) of the Act, 21 U.S.C.section 360bbb-3(b)(1), unless the authorization is terminated  or revoked sooner.       Influenza A by PCR NEGATIVE NEGATIVE Final   Influenza B by PCR NEGATIVE NEGATIVE Final    Comment: (NOTE) The Xpert Xpress SARS-CoV-2/FLU/RSV plus assay is intended as an aid in the diagnosis of influenza from Nasopharyngeal swab specimens and should not be used as a sole basis for treatment. Nasal washings and aspirates are unacceptable for Xpert Xpress SARS-CoV-2/FLU/RSV testing.  Fact Sheet for Patients: EntrepreneurPulse.com.au  Fact Sheet for Healthcare Providers: IncredibleEmployment.be  This test is not yet approved or cleared by the [place] FDA and has been authorized for detection and/or diagnosis of SARS-CoV-2 by FDA under an Emergency Use Authorization (EUA). This EUA will remain in effect (meaning this test can be used) for the duration of the COVID-19 declaration under Section 564(b)(1) of the Act, 21 U.S.C. section 360bbb-3(b)(1), unless the authorization is terminated or revoked.  Performed at [facility], [place] [address], [place],  [address]   MRSA Next Gen by PCR, Nasal     Status: None   Collection Time:  02/08/22  4:41 AM   Specimen: Nasal Mucosa; Nasal Swab  Result Value Ref Range Status   MRSA by PCR Next Gen NOT DETECTED NOT DETECTED Final    Comment: (NOTE) The GeneXpert MRSA Assay (FDA approved for NASAL specimens only), is one component of a comprehensive MRSA colonization surveillance program. It is not intended  to diagnose MRSA infection nor to guide or monitor treatment for MRSA infections. Test performance is not FDA approved in patients less than 84 years old. Performed at [facility], [place] [address], [place], [place] [address]          Radiology Studies: CT Angio Chest PE W and/or Wo Contrast  Result Date: 02/07/2022 CLINICAL DATA:  Pulmonary embolism (PE) suspected, high prob EXAM: CT ANGIOGRAPHY CHEST WITH CONTRAST TECHNIQUE: Multidetector CT imaging of the chest was performed using the standard protocol during bolus administration of intravenous contrast. Multiplanar CT image reconstructions and MIPs were obtained to evaluate the vascular anatomy. RADIATION DOSE REDUCTION: This exam was performed according to the departmental dose-optimization program which includes automated exposure control, adjustment of the mA and/or kV according to patient size and/or use of iterative reconstruction technique. CONTRAST:  163m OMNIPAQUE IOHEXOL 350 MG/ML SOLN COMPARISON:  None Available. FINDINGS: Cardiovascular: Mild cardiomegaly. Scattered coronary artery and aortic calcifications. No aneurysm. Mediastinum/Nodes: No mediastinal, hilar, or axillary adenopathy. Trachea and esophagus are unremarkable. Thyroid unremarkable. Lungs/Pleura: No pleural effusions.  Bibasilar atelectasis. Upper Abdomen: See abdominal CT report Musculoskeletal: Chest wall soft tissues are unremarkable. No acute bony abnormality. Review of the MIP images confirms the above findings. IMPRESSION: No evidence of pulmonary embolus. Bibasilar atelectasis. No acute cardiopulmonary disease. Electronically Signed   By: KRolm BaptiseM.D.   On: 02/07/2022 23:32   CT ABDOMEN PELVIS W CONTRAST  Result Date: 02/07/2022 CLINICAL DATA:  Sepsis EXAM: CT ABDOMEN AND PELVIS WITH CONTRAST TECHNIQUE: Multidetector CT imaging of the abdomen and pelvis was performed using the standard protocol following bolus administration of  intravenous contrast. RADIATION DOSE REDUCTION: This exam was performed according to the departmental dose-optimization program which includes automated exposure control, adjustment of the mA and/or kV according to patient size and/or use of iterative reconstruction technique. CONTRAST:  1094mOMNIPAQUE IOHEXOL 350 MG/ML SOLN COMPARISON:  None Available. FINDINGS: Lower chest: Bibasilar atelectasis. Hepatobiliary: No focal hepatic abnormality. Gallbladder unremarkable. Pancreas: No focal abnormality or ductal dilatation. Spleen: No focal abnormality.  Normal size. Adrenals/Urinary Tract: No adrenal abnormality. No focal renal abnormality. No stones or hydronephrosis. Urinary bladder is unremarkable. Stomach/Bowel: Areas of bowel thickening within the transverse colon and descending colon. There also several mid small bowel loops which are thick walled in the lower abdomen and upper pelvis. Stomach grossly unremarkable. No bowel obstruction. Vascular/Lymphatic: Scattered aortic calcifications. No evidence of aneurysm or adenopathy. Reproductive: No visible focal abnormality. Other: There is moderate free fluid around the liver, spleen, in the right paracolic gutter and in the pelvis. Moderate free air is noted. Exact source is difficult to determine. Fluid and gas noted near the thick walled small bowel loop in the lower abdomen/upper pelvis which is possibly the source. Musculoskeletal: No acute bony abnormality. IMPRESSION: Findings compatible with perforated bowel with moderate free fluid and free air. Exact source not definitely visualized. Possibilities include a thick walled small bowel loop in the lower abdomen/upper pelvis, likely mid small bowel for the transverse colon/descending colon. Bibasilar atelectasis. Aortic atherosclerosis. Critical Value/emergent results were called by telephone at the time of interpretation on 02/07/2022 at 11:30 pm  to provider Ezequiel Essex , who verbally acknowledged these  results. Electronically Signed   By: Rolm Baptise M.D.   On: 02/07/2022 23:30   DG Abd Portable 2 Views  Result Date: 02/07/2022 CLINICAL DATA:  sepsis EXAM: PORTABLE ABDOMEN - 2 VIEW COMPARISON:  None Available. FINDINGS: Limited evaluation due to overlapping osseous structures and overlying soft tissues. Gaseous distension of the large bowel. Question gaseous dilatation of a focal loop of small bowel within the right lower abdomen. There is no evidence of free air. No radio-opaque calculi or other significant radiographic abnormality is seen. IMPRESSION: Question gaseous dilatation of a focal loop of small bowel within the right lower abdomen. Limited evaluation due to overlapping osseous structures and overlying soft tissues. If clinically indicated, please consider CT abdomen pelvis with intravenous contrast for further evaluation. Electronically Signed   By: Iven Finn M.D.   On: 02/07/2022 21:28   DG Chest Port 1 View  Result Date: 02/07/2022 CLINICAL DATA:  Questionable sepsis - evaluate for abnormality EXAM: PORTABLE CHEST 1 VIEW COMPARISON:  Chest x-ray 06/13/2011 FINDINGS: The heart and mediastinal contours are unchanged. Low lung volumes with bibasilar airspace opacities likely representing atelectasis. No focal consolidation. No pulmonary edema. No pleural effusion. No pneumothorax. No acute osseous abnormality. IMPRESSION: Low lung volumes with no definite acute cardiopulmonary abnormality. Electronically Signed   By: Iven Finn M.D.   On: 02/07/2022 21:19        Scheduled Meds:  chlorhexidine  15 mL Mouth/Throat Once   Chlorhexidine Gluconate Cloth  6 each Topical Daily   insulin aspart  0-20 Units Subcutaneous Q4H   mouth rinse  15 mL Mouth Rinse 4 times per day   Continuous Infusions:  piperacillin-tazobactam (ZOSYN)  IV Stopped (02/09/22 0757)     LOS: 1 day    Time spent:40 min    Ashanti Littles, Geraldo Docker, MD [facility]   If 7PM-7AM, please contact  night-coverage 02/09/2022, 9:05 AM

## 2022-02-09 NOTE — Consult Note (Signed)
[place] Nurse ostomy consult note Patient consult received today for new ostomy created on 11/24 (Dr. [name]). Supply orders are provided for Nursing and guidance for the care of the ostomy and pouching system.  Provision of guidance for PI prevention (floatation of heels, placement of a sacral foam prophylactic dressing, and routine turning and repositioning) is done today. A pressure redistribution chair cushion for use when the patient is OOB in the chair is requested.  First [place] Nursing visit will be on Monday, 11/27 for first post operative pouch change, stoma assessment, and initiation of patient education.  [place] nursing team will follow, and will remain available to this patient, the nursing and medical teams.    Thank you for inviting [place] to participate in this patient's Plan of Care.  Maudie Flakes, MSN, RN, CNS, [place], Serita Grammes, [employer], [employer] phone:  [phone]

## 2022-02-09 NOTE — Progress Notes (Signed)
2 Days Post-Op Ex lap, L colectomy and colostomy Subjective: Feeling better, Good UOP, NG high but pt taking PO clears  Objective: Vital signs in last 24 hours: Temp:  [98.5 F (36.9 C)-100.2 F (37.9 C)] 100.2 F (37.9 C) (11/25 0350) Pulse Rate:  [107-130] 107 (11/25 0700) Resp:  [15-25] 16 (11/25 0700) BP: (108-145)/(59-86) 145/68 (11/25 0700) SpO2:  [88 %-95 %] 94 % (11/25 0700)   Intake/Output from previous day: 11/24 0701 - 11/25 0700 In: 3882.6 [P.O.:2400; I.V.:1391.4; IV Piggyback:91.2] Out: 4450 [Urine:800; Emesis/NG output:3650] Intake/Output this shift: No intake/output data recorded.   General appearance: alert and cooperative GI: normal findings: soft, ostomy viable  Incision: no significant drainage  Lab Results:  Recent Labs    02/08/22 0726 02/09/22 0246  WBC 7.0 10.1  HGB 10.5* 10.1*  HCT 31.7* 29.0*  PLT 302 320    BMET Recent Labs    02/08/22 0726 02/09/22 0246  NA 134* 138  K 4.4 3.8  CL 104 100  CO2 16* 28  GLUCOSE 158* 145*  BUN 17 22  CREATININE 0.65 0.68  CALCIUM 7.3* 7.5*    PT/INR Recent Labs    02/07/22 2030  LABPROT 15.8*  INR 1.3*    ABG No results for input(s): "PHART", "HCO3" in the last 72 hours.  Invalid input(s): "PCO2", "PO2"  MEDS, Scheduled  chlorhexidine  15 mL Mouth/Throat Once   Chlorhexidine Gluconate Cloth  6 each Topical Daily   insulin aspart  0-20 Units Subcutaneous Q4H   mouth rinse  15 mL Mouth Rinse 4 times per day    Studies/Results: CT Angio Chest PE W and/or Wo Contrast  Result Date: 02/07/2022 CLINICAL DATA:  Pulmonary embolism (PE) suspected, high prob EXAM: CT ANGIOGRAPHY CHEST WITH CONTRAST TECHNIQUE: Multidetector CT imaging of the chest was performed using the standard protocol during bolus administration of intravenous contrast. Multiplanar CT image reconstructions and MIPs were obtained to evaluate the vascular anatomy. RADIATION DOSE REDUCTION: This exam was performed according  to the departmental dose-optimization program which includes automated exposure control, adjustment of the mA and/or kV according to patient size and/or use of iterative reconstruction technique. CONTRAST:  128m OMNIPAQUE IOHEXOL 350 MG/ML SOLN COMPARISON:  None Available. FINDINGS: Cardiovascular: Mild cardiomegaly. Scattered coronary artery and aortic calcifications. No aneurysm. Mediastinum/Nodes: No mediastinal, hilar, or axillary adenopathy. Trachea and esophagus are unremarkable. Thyroid unremarkable. Lungs/Pleura: No pleural effusions.  Bibasilar atelectasis. Upper Abdomen: See abdominal CT report Musculoskeletal: Chest wall soft tissues are unremarkable. No acute bony abnormality. Review of the MIP images confirms the above findings. IMPRESSION: No evidence of pulmonary embolus. Bibasilar atelectasis. No acute cardiopulmonary disease. Electronically Signed   By: KRolm BaptiseM.D.   On: 02/07/2022 23:32   CT ABDOMEN PELVIS W CONTRAST  Result Date: 02/07/2022 CLINICAL DATA:  Sepsis EXAM: CT ABDOMEN AND PELVIS WITH CONTRAST TECHNIQUE: Multidetector CT imaging of the abdomen and pelvis was performed using the standard protocol following bolus administration of intravenous contrast. RADIATION DOSE REDUCTION: This exam was performed according to the departmental dose-optimization program which includes automated exposure control, adjustment of the mA and/or kV according to patient size and/or use of iterative reconstruction technique. CONTRAST:  1045mOMNIPAQUE IOHEXOL 350 MG/ML SOLN COMPARISON:  None Available. FINDINGS: Lower chest: Bibasilar atelectasis. Hepatobiliary: No focal hepatic abnormality. Gallbladder unremarkable. Pancreas: No focal abnormality or ductal dilatation. Spleen: No focal abnormality.  Normal size. Adrenals/Urinary Tract: No adrenal abnormality. No focal renal abnormality. No stones or hydronephrosis. Urinary bladder is unremarkable. Stomach/Bowel: Areas  of bowel thickening within the  transverse colon and descending colon. There also several mid small bowel loops which are thick walled in the lower abdomen and upper pelvis. Stomach grossly unremarkable. No bowel obstruction. Vascular/Lymphatic: Scattered aortic calcifications. No evidence of aneurysm or adenopathy. Reproductive: No visible focal abnormality. Other: There is moderate free fluid around the liver, spleen, in the right paracolic gutter and in the pelvis. Moderate free air is noted. Exact source is difficult to determine. Fluid and gas noted near the thick walled small bowel loop in the lower abdomen/upper pelvis which is possibly the source. Musculoskeletal: No acute bony abnormality. IMPRESSION: Findings compatible with perforated bowel with moderate free fluid and free air. Exact source not definitely visualized. Possibilities include a thick walled small bowel loop in the lower abdomen/upper pelvis, likely mid small bowel for the transverse colon/descending colon. Bibasilar atelectasis. Aortic atherosclerosis. Critical Value/emergent results were called by telephone at the time of interpretation on 02/07/2022 at 11:30 pm to provider [facility] , who verbally acknowledged these results. Electronically Signed   By: Rolm Baptise M.D.   On: 02/07/2022 23:30   DG Abd Portable 2 Views  Result Date: 02/07/2022 CLINICAL DATA:  sepsis EXAM: PORTABLE ABDOMEN - 2 VIEW COMPARISON:  None Available. FINDINGS: Limited evaluation due to overlapping osseous structures and overlying soft tissues. Gaseous distension of the large bowel. Question gaseous dilatation of a focal loop of small bowel within the right lower abdomen. There is no evidence of free air. No radio-opaque calculi or other significant radiographic abnormality is seen. IMPRESSION: Question gaseous dilatation of a focal loop of small bowel within the right lower abdomen. Limited evaluation due to overlapping osseous structures and overlying soft tissues. If clinically  indicated, please consider CT abdomen pelvis with intravenous contrast for further evaluation. Electronically Signed   By: Iven Finn M.D.   On: 02/07/2022 21:28   DG Chest Port 1 View  Result Date: 02/07/2022 CLINICAL DATA:  Questionable sepsis - evaluate for abnormality EXAM: PORTABLE CHEST 1 VIEW COMPARISON:  Chest x-ray 06/13/2011 FINDINGS: The heart and mediastinal contours are unchanged. Low lung volumes with bibasilar airspace opacities likely representing atelectasis. No focal consolidation. No pulmonary edema. No pleural effusion. No pneumothorax. No acute osseous abnormality. IMPRESSION: Low lung volumes with no definite acute cardiopulmonary abnormality. Electronically Signed   By: Iven Finn M.D.   On: 02/07/2022 21:19    Assessment: s/p Procedure(s): EXPLORATORY LAPAROTOMY, COLON RESECTION, OSTOMY Patient Active Problem List   Diagnosis Date Noted   Perforated abdominal viscus 02/08/2022   IFG (impaired fasting glucose) 09/27/2015   Borderline hypertension 09/27/2015   Lateral epicondylitis of left elbow 01/27/2015   Fracture of toe, closed 05/18/2014   Seborrheic dermatitis 02/28/2014   Subacromial bursitis 11/09/2013   Bronchopneumonia 11/03/2013   Cerumen impaction 11/03/2013   Posterior interosseous nerve syndrome 10/25/2013   Hearing impairment 10/04/2013   Right shoulder pain 10/04/2013   Health maintenance examination 02/26/2013   Hyperlipidemia 06/15/2011   SEVERE OBSTRUCTIVE SLEEP APNEA with AHI 94 03/30/2010   Morbid obesity ([place]) 02/06/2010   Adult ADHD 03/07/2009   ALLERGIC RHINITIS 03/06/2009   DERMATITIS 03/06/2009    Expected post op course  Plan: Cont IV Abx x 5 days Cont NG: await return of bowel function Ok for sips of clears for comfort Ambulate  Cont foley today to monitor UOP Per CCM for medical issues   LOS: 1 day     .Rosario Adie, MD [facility], [place]  02/09/2022 7:50 AM

## 2022-02-10 ENCOUNTER — Encounter (HOSPITAL_COMMUNITY): Payer: Self-pay | Admitting: General Surgery

## 2022-02-10 ENCOUNTER — Inpatient Hospital Stay (HOSPITAL_COMMUNITY): Payer: 59

## 2022-02-10 DIAGNOSIS — K668 Other specified disorders of peritoneum: Secondary | ICD-10-CM | POA: Diagnosis not present

## 2022-02-10 DIAGNOSIS — Z9049 Acquired absence of other specified parts of digestive tract: Secondary | ICD-10-CM

## 2022-02-10 DIAGNOSIS — K51018 Ulcerative (chronic) pancolitis with other complication: Secondary | ICD-10-CM | POA: Diagnosis not present

## 2022-02-10 DIAGNOSIS — R198 Other specified symptoms and signs involving the digestive system and abdomen: Secondary | ICD-10-CM | POA: Diagnosis not present

## 2022-02-10 DIAGNOSIS — A419 Sepsis, unspecified organism: Secondary | ICD-10-CM | POA: Diagnosis not present

## 2022-02-10 DIAGNOSIS — R6520 Severe sepsis without septic shock: Secondary | ICD-10-CM | POA: Diagnosis not present

## 2022-02-10 LAB — COMPREHENSIVE METABOLIC PANEL
ALT: 17 U/L (ref 0–44)
AST: 19 U/L (ref 15–41)
Albumin: 1.6 g/dL — ABNORMAL LOW (ref 3.5–5.0)
Alkaline Phosphatase: 59 U/L (ref 38–126)
Anion gap: 8 (ref 5–15)
BUN: 22 mg/dL (ref 8–23)
CO2: 40 mmol/L — ABNORMAL HIGH (ref 22–32)
Calcium: 7.8 mg/dL — ABNORMAL LOW (ref 8.9–10.3)
Chloride: 95 mmol/L — ABNORMAL LOW (ref 98–111)
Creatinine, Ser: 0.6 mg/dL — ABNORMAL LOW (ref 0.61–1.24)
GFR, Estimated: >60 mL/min (ref >60)
Glucose, Bld: 104 mg/dL — ABNORMAL HIGH (ref 70–99)
Potassium: 3.1 mmol/L — ABNORMAL LOW (ref 3.5–5.1)
Sodium: 143 mmol/L (ref 135–145)
Total Bilirubin: 0.7 mg/dL (ref 0.3–1.2)
Total Protein: 5.4 g/dL — ABNORMAL LOW (ref 6.5–8.1)

## 2022-02-10 LAB — CBC WITH DIFFERENTIAL/PLATELET
Abs Immature Granulocytes: 0.1 10*3/uL — ABNORMAL HIGH (ref 0.00–0.07)
Basophils Absolute: 0 10*3/uL (ref 0.0–0.1)
Basophils Relative: 0 %
Eosinophils Absolute: 0 10*3/uL (ref 0.0–0.5)
Eosinophils Relative: 0 %
HCT: 28.5 % — ABNORMAL LOW (ref 39.0–52.0)
Hemoglobin: 9.8 g/dL — ABNORMAL LOW (ref 13.0–17.0)
Immature Granulocytes: 1 %
Lymphocytes Relative: 4 %
Lymphs Abs: 0.3 10*3/uL — ABNORMAL LOW (ref 0.7–4.0)
MCH: 34.1 pg — ABNORMAL HIGH (ref 26.0–34.0)
MCHC: 34.4 g/dL (ref 30.0–36.0)
MCV: 99.3 fL (ref 80.0–100.0)
Monocytes Absolute: 0.3 10*3/uL (ref 0.1–1.0)
Monocytes Relative: 3 %
Neutro Abs: 8.2 10*3/uL — ABNORMAL HIGH (ref 1.7–7.7)
Neutrophils Relative %: 92 %
Platelets: 282 10*3/uL (ref 150–400)
RBC: 2.87 MIL/uL — ABNORMAL LOW (ref 4.22–5.81)
RDW: 13.4 % (ref 11.5–15.5)
WBC: 8.9 10*3/uL (ref 4.0–10.5)
nRBC: 0 % (ref 0.0–0.2)

## 2022-02-10 LAB — GLUCOSE, CAPILLARY
Glucose-Capillary: 115 mg/dL — ABNORMAL HIGH (ref 70–99)
Glucose-Capillary: 131 mg/dL — ABNORMAL HIGH (ref 70–99)
Glucose-Capillary: 139 mg/dL — ABNORMAL HIGH (ref 70–99)
Glucose-Capillary: 142 mg/dL — ABNORMAL HIGH (ref 70–99)
Glucose-Capillary: 149 mg/dL — ABNORMAL HIGH (ref 70–99)
Glucose-Capillary: 157 mg/dL — ABNORMAL HIGH (ref 70–99)
Glucose-Capillary: 162 mg/dL — ABNORMAL HIGH (ref 70–99)
Glucose-Capillary: 235 mg/dL — ABNORMAL HIGH (ref 70–99)

## 2022-02-10 LAB — QUANTIFERON-TB GOLD PLUS
Mitogen-NIL: 0.08 IU/mL
NIL: 0.07 IU/mL
QuantiFERON-TB Gold Plus: UNDETERMINED — AB
TB1-NIL: 0.01 IU/mL
TB2-NIL: 0 IU/mL

## 2022-02-10 LAB — URINE CULTURE: Culture: NO GROWTH

## 2022-02-10 LAB — MAGNESIUM: Magnesium: 2.2 mg/dL (ref 1.7–2.4)

## 2022-02-10 LAB — PHOSPHORUS: Phosphorus: 3 mg/dL (ref 2.5–4.6)

## 2022-02-10 MED ORDER — ACETAMINOPHEN 160 MG/5ML PO SOLN
650.0000 mg | Freq: Four times a day (QID) | ORAL | Status: DC | PRN
  Administered 2022-02-11 – 2022-02-15 (×6): 650 mg
  Filled 2022-02-10 (×8): qty 20.3

## 2022-02-10 MED ORDER — ACETAMINOPHEN 325 MG PO TABS
650.0000 mg | ORAL_TABLET | Freq: Four times a day (QID) | ORAL | Status: DC | PRN
  Administered 2022-02-10 (×2): 650 mg via ORAL
  Filled 2022-02-10 (×2): qty 2

## 2022-02-10 MED ORDER — POTASSIUM CHLORIDE 10 MEQ/100ML IV SOLN
10.0000 meq | INTRAVENOUS | Status: AC
  Administered 2022-02-10 (×6): 10 meq via INTRAVENOUS
  Filled 2022-02-10 (×6): qty 100

## 2022-02-10 NOTE — Progress Notes (Signed)
Spoke to Dr. Sherral Hammers, pt needs to remain NPO & NG tube stays in place. Per Dr. Sherral Hammers order. Do not remove NG tube or change diet without speaking to him first.

## 2022-02-10 NOTE — Progress Notes (Signed)
NGT still in place no cpap tonight.

## 2022-02-10 NOTE — Progress Notes (Addendum)
Pt's wife notified this RN that pt was nauseous & spitting some gastric content up. Pt was also shaking. Tempt checked. 101.3 oral. Ice applied & Dr. Sherral Hammers notified. Tylenol given.Pt belching. Very little was in barf bag, looks like bile in the bag. Pt stable at this time. Surgery notified.

## 2022-02-10 NOTE — Progress Notes (Signed)
PROGRESS NOTE    Richard [name]  INO:[id number] DOB: 1955-03-28 DOA: 02/07/2022 PCP: [name], MD     Brief Narrative:  66 y/o WM, PMHx Scrotal abscess August 2023, Colon polyp, Diverticulosis, ADHD, Anxiety, Rhinitis, COVID July 2022, HLD, HTN, OSA, PNA, Seborrheic dermatitis, Hypogonadism, DM type 2, Peripheral neuropathy, former smoker,   Developed diarrhea about 3 to 4 weeks prior to admission.  He was seen by Dr. [name] with GI.  He had colonoscopy on 02/04/22.  This showed diffuse, severe inflammation throughout the colon with mucosal friability and biopsies were performed.  He was found to have Crohn's colitis and started on prednisone.  He started having discomfort in his abdomen on 02/06/22.  This started in his lower abdomen and migrated toward his upper abdomen.  He started feeling bloated as well.  He had trouble getting off the commode due to feeling weak "like he was frozen".  His wife called EMS and he was brought to the ER.  He had fever 102.1 F with tachycardia.  CT abd/pelvis showed findings compatible with perforated bowel with moderate free fluid and free air.  He was started on antibiotics and IV fluids.  Surgery consulted to assess for emergent laparotomy.  PCCM consulted to admit to ICU.    Subjective: 11/26 afebrile overnight, tachycardic. ADDENDUM: Paged by RN patient with acute abdominal pain, diaphoretic, positive nausea, positive vomiting.  NG tube reinserted immediately 2072m of bilious fluid obtained     Assessment & Plan: Covid vaccination;   Principal Problem:   Perforated abdominal viscus  Acute peritonitis with sepsis from perforated abdominal viscus -11/24. s/p emergent laparotomy Recent colonoscopy with diagnosis of Crohn's colitis.   -NPO x ice chips -NGT in place LIS -PRN dilaudid for pain  -continue zosyn -follow culture results  -[place] [facility] consulted to assist in ongoing management of Chron's Colitis  -Lactated Ringer's  1036mhr   At Risk AKI  In setting of contrast administration -remove foley if sr cr remains stable  -Strict in and out - Daily weight -Avoid nephrotoxic agents, ensure adequate renal perfusion   Essential HTN - Hold lisinopril  HLD -hold Lipitor    DM type 2 poorly controlled with hyperglycemia. -11/8 Hemoglobin A1c= 6.6 -hold outpatient metformin, jardiance CBG (last 3)  Recent Labs    02/09/22 1622 02/10/22 0013 02/10/22 0327  GLUCAP 152* 139* 115*   -Resistant SSI   OSA Baseline CPAP 10 cm H20, followed by Dr. OlAnder SladeCPAP QSH   -NGT may impede mask seal    Hx of ADHD, Anxiety. -hold home methylphenidate    Hx of Hypogonadism. -hold outpatient testosterone  -11/25 testosterone level pending   Moderate Protein Calorie Malnutrition (POA) -nutrition when ok per CCS   Obesity (BMI 35.7 to m/kg) -Address as outpatient  Hypokalemia - Potassium goal> 4 - 11/26 Potassium IV 60 mEq     Mobility Assessment (last 72 hours)     Mobility Assessment   No documentation.                    DVT prophylaxis: SCD Code Status: Full Family Communication: 11/25 wife at bedside for discussion of plan of care all questions answered Status is: Inpatient    Dispo: The patient is from: Home              Anticipated d/c is to: Home              Anticipated d/c date is: > 3 days  Patient currently is not medically stable to d/c.      Consultants:  PCCM CCS    Procedures/Significant Events:  11/23 s/p exploratory lap, sigmoid resection and transverse colostomy      I have personally reviewed and interpreted all radiology studies and my findings are as above.  VENTILATOR SETTINGS: Flow 5 L/min 11/26 SPO2 94%   Cultures 11/23 ex lap Path pending 11/25 urine pending    Antimicrobials: Anti-infectives (From admission, onward)    Start     Ordered Stop   02/08/22 1200  piperacillin-tazobactam (ZOSYN) IVPB 3.375 g         02/08/22 0505     02/08/22 0500  piperacillin-tazobactam (ZOSYN) IVPB 3.375 g  Status:  Discontinued        02/08/22 0006 02/08/22 0505   02/08/22 0334  piperacillin-tazobactam (ZOSYN) 3.375 GM/50ML IVPB       Note to Pharmacy: Enis Gash W: cabinet override   02/08/22 0334 02/08/22 0345   02/07/22 2030  ceFEPIme (MAXIPIME) 2 g in sodium chloride 0.9 % 100 mL IVPB        02/07/22 2023 02/07/22 2248   02/07/22 2030  metroNIDAZOLE (FLAGYL) IVPB 500 mg        02/07/22 2023 02/08/22 0506   02/07/22 2030  vancomycin (VANCOCIN) IVPB 1000 mg/200 mL premix        02/07/22 2023 02/08/22 0506         Devices    LINES / TUBES:      Continuous Infusions:  lactated ringers 100 mL/hr at 02/10/22 0822   piperacillin-tazobactam (ZOSYN)  IV Stopped (02/10/22 0817)     Objective: Vitals:   02/10/22 0600 02/10/22 0700 02/10/22 0800 02/10/22 0900  BP: 131/68 129/63 (!) 152/72 133/73  Pulse: (!) 105 (!) 107 (!) 110 (!) 115  Resp: 18 19 (!) 21 (!) 25  Temp:   98 F (36.7 C)   TempSrc:   Oral   SpO2: 94% 92% 93% (!) 88%  Weight:      Height:        Intake/Output Summary (Last 24 hours) at 02/10/2022 0930 Last data filed at 02/10/2022 2130 Gross per 24 hour  Intake 2188.02 ml  Output 7275 ml  Net -5086.98 ml    Filed Weights   02/08/22 0500  Weight: 109.1 kg    Examination:  General: A/O x4, No acute respiratory distress Eyes: negative scleral hemorrhage, negative anisocoria, negative icterus ENT: Negative Runny nose, negative gingival bleeding, Neck:  Negative scars, masses, torticollis, lymphadenopathy, JVD Lungs: Clear to auscultation bilaterally without wheezes or crackles Cardiovascular: Tachycardic without murmur gallop or rub normal S1 and S2 Abdomen: negative abdominal pain, nondistended, positive soft, bowel sounds, no rebound, no ascites, no appreciable mass, midline ex lap incision covered in clean no sign of leakage.  Ostomy pouch in place. Extremities: No  significant cyanosis, clubbing, or edema bilateral lower extremities Skin: Negative rashes, lesions, ulcers Psychiatric:  Negative depression, negative anxiety, negative fatigue, negative mania  Central nervous system:  Cranial nerves II through XII intact, tongue/uvula midline, all extremities muscle strength 5/5, sensation intact throughout, negative dysarthria, negative expressive aphasia, negative receptive aphasia.  .     Data Reviewed: Care during the described time interval was provided by me .  I have reviewed this patient's available data, including medical history, events of note, physical examination, and all test results as part of my evaluation.  CBC: Recent Labs  Lab 02/04/22 1038 02/07/22 2030 02/07/22 2031 02/08/22 0726 02/09/22  0246 02/10/22 0252  WBC 4.4 2.2*  --  7.0 10.1 8.9  NEUTROABS 3.1 1.8  --   --   --  8.2*  HGB 9.9* 12.9* 13.9 10.5* 10.1* 9.8*  HCT 28.8* 38.1* 41.0 31.7* 29.0* 28.5*  MCV 102.4* 101.6*  --  102.9* 98.6 99.3  PLT 263.0 404*  --  302 320 323    Basic Metabolic Panel: Recent Labs  Lab 02/07/22 2030 02/07/22 2031 02/08/22 0726 02/09/22 0246 02/10/22 0252  NA 133* 131* 134* 138 143  K 4.0 3.9 4.4 3.8 3.1*  CL 96* 95* 104 100 95*  CO2 19*  --  16* 28 40*  GLUCOSE 155* 155* 158* 145* 104*  BUN '16 15 17 22 22  '$ CREATININE 0.67 0.50* 0.65 0.68 0.60*  CALCIUM 8.3*  --  7.3* 7.5* 7.8*  MG  --   --   --   --  2.2  PHOS  --   --   --   --  3.0    GFR: Estimated Creatinine Clearance: 112.1 mL/min (A) (by C-G formula based on SCr of 0.6 mg/dL (L)). Liver Function Tests: Recent Labs  Lab 02/07/22 2030 02/08/22 0726 02/10/22 0252  AST 40 22 19  ALT '29 18 17  '$ ALKPHOS 87 44 59  BILITOT 1.4* 1.6* 0.7  PROT 7.2 4.7* 5.4*  ALBUMIN 2.3* 1.8* 1.6*    No results for input(s): "LIPASE", "AMYLASE" in the last 168 hours. No results for input(s): "AMMONIA" in the last 168 hours. Coagulation Profile: Recent Labs  Lab 02/07/22 2030  INR  1.3*    Cardiac Enzymes: Recent Labs  Lab 02/07/22 2255  CKTOTAL 231    BNP (last 3 results) No results for input(s): "PROBNP" in the last 8760 hours. HbA1C: No results for input(s): "HGBA1C" in the last 72 hours. CBG: Recent Labs  Lab 02/09/22 0853 02/09/22 1138 02/09/22 1622 02/10/22 0013 02/10/22 0327  GLUCAP 152* 138* 152* 139* 115*    Lipid Profile: No results for input(s): "CHOL", "HDL", "LDLCALC", "TRIG", "CHOLHDL", "LDLDIRECT" in the last 72 hours. Thyroid Function Tests: No results for input(s): "TSH", "T4TOTAL", "FREET4", "T3FREE", "THYROIDAB" in the last 72 hours. Anemia Panel: No results for input(s): "VITAMINB12", "FOLATE", "FERRITIN", "TIBC", "IRON", "RETICCTPCT" in the last 72 hours. Sepsis Labs: Recent Labs  Lab 02/07/22 2030 02/07/22 2224  LATICACIDVEN 2.3* 1.3     Recent Results (from the past 240 hour(s))  Urine Culture     Status: Abnormal   Collection Time: 02/07/22  9:14 AM   Specimen: In/Out Cath Urine  Result Value Ref Range Status   Specimen Description   Final    IN/OUT CATH URINE Performed at [facility], [place] [address], [place], [place] [address]    Special Requests   Final    NONE Performed at [facility], [place] [address], [place], [place] [address]    Culture (A)  Final    1,000 COLONIES/mL STAPHYLOCOCCUS EPIDERMIDIS CALL MICROBIOLOGY LAB IF SENSITIVITIES ARE REQUIRED. Performed at [facility], [place] [address], [place], [place] [address]    Report Status 02/09/2022 FINAL  Final  Blood Culture (routine x 2)     Status: None (Preliminary result)   Collection Time: 02/07/22  8:29 PM   Specimen: BLOOD  Result Value Ref Range Status   Specimen Description   Final    BLOOD LEFT ANTECUBITAL Performed at [place] [address] [place], [place] [address]    Special  Requests   Final    BOTTLES DRAWN AEROBIC AND ANAEROBIC Blood Culture results may not be  optimal due to an excessive volume of blood received in culture bottles Performed at [place] [address], [place], [place] [address]    Culture   Final    NO GROWTH 3 DAYS Performed at [facility], [place] [address] [place], [place] [address]    Report Status PENDING  Incomplete  Blood Culture (routine x 2)     Status: None (Preliminary result)   Collection Time: 02/07/22  8:30 PM   Specimen: BLOOD LEFT ARM  Result Value Ref Range Status   Specimen Description   Final    BLOOD LEFT ARM Performed at [facility], [place] [address], [place], [place] [address]    Special Requests   Final    BOTTLES DRAWN AEROBIC AND ANAEROBIC Blood Culture adequate volume Performed at [place] [address], [place], [place] [address]    Culture   Final    NO GROWTH 3 DAYS Performed at [facility], [place] [address] [place],  [address]    Report Status PENDING  Incomplete  Resp Panel by RT-PCR (Flu A&B, Covid) Anterior Nasal Swab     Status: None   Collection Time: 02/07/22  9:25 PM   Specimen: Anterior Nasal Swab  Result Value Ref Range Status   SARS Coronavirus 2 by RT PCR NEGATIVE NEGATIVE Final    Comment: (NOTE) SARS-CoV-2 target nucleic acids are NOT DETECTED.  The SARS-CoV-2 RNA is generally detectable in upper respiratory specimens during the acute phase of infection. The lowest concentration of SARS-CoV-2 viral copies this assay can detect is 138 copies/mL. A negative result does not preclude SARS-Cov-2 infection and should not be used as the sole basis for treatment or other patient management decisions. A negative result may occur with  improper specimen collection/handling, submission of specimen other than nasopharyngeal swab, presence of viral mutation(s) within the areas targeted by this assay, and inadequate number of viral copies(<138 copies/mL). A negative result must be combined with clinical  observations, patient history, and epidemiological information. The expected result is Negative.  Fact Sheet for Patients:  EntrepreneurPulse.com.au  Fact Sheet for Healthcare Providers:  IncredibleEmployment.be  This test is no t yet approved or cleared by the [place] FDA and  has been authorized for detection and/or diagnosis of SARS-CoV-2 by FDA under an Emergency Use Authorization (EUA). This EUA will remain  in effect (meaning this test can be used) for the duration of the COVID-19 declaration under Section 564(b)(1) of the Act, 21 U.S.C.section 360bbb-3(b)(1), unless the authorization is terminated  or revoked sooner.       Influenza A by PCR NEGATIVE NEGATIVE Final   Influenza B by PCR NEGATIVE NEGATIVE Final    Comment: (NOTE) The Xpert Xpress SARS-CoV-2/FLU/RSV plus assay is intended as an aid in the diagnosis of influenza from Nasopharyngeal swab specimens and should not be used as a sole basis for treatment. Nasal washings and aspirates are unacceptable for Xpert Xpress SARS-CoV-2/FLU/RSV testing.  Fact Sheet for Patients: EntrepreneurPulse.com.au  Fact Sheet for Healthcare Providers: IncredibleEmployment.be  This test is not yet approved or cleared by the [place] FDA and has been authorized for detection and/or diagnosis of SARS-CoV-2 by FDA under an Emergency Use Authorization (EUA). This EUA will remain in effect (meaning this test can be used) for the duration of the COVID-19 declaration under Section 564(b)(1)  of the Act, 21 U.S.C. section 360bbb-3(b)(1), unless the authorization is terminated or revoked.  Performed at [facility], [place] [address], [place], [place] [address]   MRSA Next Gen by PCR, Nasal     Status: None   Collection Time: 02/08/22  4:41 AM   Specimen: Nasal Mucosa; Nasal Swab  Result Value Ref Range Status   MRSA by PCR Next Gen NOT  DETECTED NOT DETECTED Final    Comment: (NOTE) The GeneXpert MRSA Assay (FDA approved for NASAL specimens only), is one component of a comprehensive MRSA colonization surveillance program. It is not intended to diagnose MRSA infection nor to guide or monitor treatment for MRSA infections. Test performance is not FDA approved in patients less than 1 years old. Performed at [facility], [place] [address], [place], [place] [address]          Radiology Studies: No results found.      Scheduled Meds:  chlorhexidine  15 mL Mouth/Throat Once   Chlorhexidine Gluconate Cloth  6 each Topical Daily   insulin aspart  0-20 Units Subcutaneous Q4H   mouth rinse  15 mL Mouth Rinse 4 times per day   Continuous Infusions:  lactated ringers 100 mL/hr at 02/10/22 0822   piperacillin-tazobactam (ZOSYN)  IV Stopped (02/10/22 0817)     LOS: 2 days    Time spent:40 min    Evangelyne Loja, Geraldo Docker, MD [facility]   If 7PM-7AM, please contact night-coverage 02/10/2022, 9:30 AM

## 2022-02-10 NOTE — Progress Notes (Addendum)
Daily Progress Note  Hospital Day: 4  Chief Complaint: New IBD, bowel peforation  Brief History 66 y.o. male with a pmh not limited to DM, HTN, HLD, ADD   Assessment:  # 32 male with newly diagnosed IBD ( probably Crohn's colitis) with bowel perforation s/p exploratory lap, sigmoid resection and transverse colostomy on 11/23 Today's labs reviewed: Hgb stable at 9.8.  WBC 8.9.  He has been macrocytic with MCV of ~ 103, Renal function normal, K+ normal.  Afebrile, remains tachycardic at 116, normotensive NGT is out, tolerating ice chips. Brown stool in ostomy bag.   # Hypokalemia, K+ 3.1  Plan:    Awaiting colon path. IBD treatment plan to be determined after path reviewed.  Surgery starting clears.  Transferring to the floor Surgery wants total of 5 days of Zosyn ( on day # 3)  K+ repletion per TRH  Subjective   Overall feels okay. NGT out, tolerating ice chips. Ostomy bag with liquid brown stool  Objective   Endoscopic studies:   02/04/22 colonoscopy -Preparation of the colon was fair, but adequate for the purposes of this study. - Anal fissure found on perianal exam. - Diffuse severe inflammation was found in the entire examined colon secondary to Crohn's disease with colonic involvement. Biopsied. - The examined portion of the ileum was normal. Biopsied  Imaging:  CT Angio Chest PE W and/or Wo Contrast  Result Date: 02/07/2022 CLINICAL DATA:  Pulmonary embolism (PE) suspected, high prob EXAM: CT ANGIOGRAPHY CHEST WITH CONTRAST TECHNIQUE: Multidetector CT imaging of the chest was performed using the standard protocol during bolus administration of intravenous contrast. Multiplanar CT image reconstructions and MIPs were obtained to evaluate the vascular anatomy. RADIATION DOSE REDUCTION: This exam was performed according to the departmental dose-optimization program which includes automated exposure control, adjustment of the mA and/or kV according to patient size  and/or use of iterative reconstruction technique. CONTRAST:  137m OMNIPAQUE IOHEXOL 350 MG/ML SOLN COMPARISON:  None Available. FINDINGS: Cardiovascular: Mild cardiomegaly. Scattered coronary artery and aortic calcifications. No aneurysm. Mediastinum/Nodes: No mediastinal, hilar, or axillary adenopathy. Trachea and esophagus are unremarkable. Thyroid unremarkable. Lungs/Pleura: No pleural effusions.  Bibasilar atelectasis. Upper Abdomen: See abdominal CT report Musculoskeletal: Chest wall soft tissues are unremarkable. No acute bony abnormality. Review of the MIP images confirms the above findings. IMPRESSION: No evidence of pulmonary embolus. Bibasilar atelectasis. No acute cardiopulmonary disease. Electronically Signed   By: KRolm BaptiseM.D.   On: 02/07/2022 23:32   CT ABDOMEN PELVIS W CONTRAST  Result Date: 02/07/2022 CLINICAL DATA:  Sepsis EXAM: CT ABDOMEN AND PELVIS WITH CONTRAST TECHNIQUE: Multidetector CT imaging of the abdomen and pelvis was performed using the standard protocol following bolus administration of intravenous contrast. RADIATION DOSE REDUCTION: This exam was performed according to the departmental dose-optimization program which includes automated exposure control, adjustment of the mA and/or kV according to patient size and/or use of iterative reconstruction technique. CONTRAST:  104mOMNIPAQUE IOHEXOL 350 MG/ML SOLN COMPARISON:  None Available. FINDINGS: Lower chest: Bibasilar atelectasis. Hepatobiliary: No focal hepatic abnormality. Gallbladder unremarkable. Pancreas: No focal abnormality or ductal dilatation. Spleen: No focal abnormality.  Normal size. Adrenals/Urinary Tract: No adrenal abnormality. No focal renal abnormality. No stones or hydronephrosis. Urinary bladder is unremarkable. Stomach/Bowel: Areas of bowel thickening within the transverse colon and descending colon. There also several mid small bowel loops which are thick walled in the lower abdomen and upper pelvis.  Stomach grossly unremarkable. No bowel obstruction. Vascular/Lymphatic: Scattered aortic calcifications. No  evidence of aneurysm or adenopathy. Reproductive: No visible focal abnormality. Other: There is moderate free fluid around the liver, spleen, in the right paracolic gutter and in the pelvis. Moderate free air is noted. Exact source is difficult to determine. Fluid and gas noted near the thick walled small bowel loop in the lower abdomen/upper pelvis which is possibly the source. Musculoskeletal: No acute bony abnormality. IMPRESSION: Findings compatible with perforated bowel with moderate free fluid and free air. Exact source not definitely visualized. Possibilities include a thick walled small bowel loop in the lower abdomen/upper pelvis, likely mid small bowel for the transverse colon/descending colon. Bibasilar atelectasis. Aortic atherosclerosis. Critical Value/emergent results were called by telephone at the time of interpretation on 02/07/2022 at 11:30 pm to provider [facility] , who verbally acknowledged these results. Electronically Signed   By: Rolm Baptise M.D.   On: 02/07/2022 23:30    Lab Results: Recent Labs    02/08/22 0726 02/09/22 0246 02/10/22 0252  WBC 7.0 10.1 8.9  HGB 10.5* 10.1* 9.8*  HCT 31.7* 29.0* 28.5*  PLT 302 320 282   BMET Recent Labs    02/08/22 0726 02/09/22 0246 02/10/22 0252  NA 134* 138 143  K 4.4 3.8 3.1*  CL 104 100 95*  CO2 16* 28 40*  GLUCOSE 158* 145* 104*  BUN '17 22 22  '$ CREATININE 0.65 0.68 0.60*  CALCIUM 7.3* 7.5* 7.8*   LFT Recent Labs    02/10/22 0252  PROT 5.4*  ALBUMIN 1.6*  AST 19  ALT 17  ALKPHOS 59  BILITOT 0.7   PT/INR Recent Labs    02/07/22 2030  LABPROT 15.8*  INR 1.3*     Scheduled inpatient medications:   chlorhexidine  15 mL Mouth/Throat Once   Chlorhexidine Gluconate Cloth  6 each Topical Daily   insulin aspart  0-20 Units Subcutaneous Q4H   mouth rinse  15 mL Mouth Rinse 4 times per day    Continuous inpatient infusions:   lactated ringers 100 mL/hr at 02/10/22 0822   piperacillin-tazobactam (ZOSYN)  IV Stopped (02/10/22 0817)   PRN inpatient medications: HYDROmorphone (DILAUDID) injection, ondansetron (ZOFRAN) IV, mouth rinse, mouth rinse  Vital signs in last 24 hours: Temp:  [97.4 F (36.3 C)-99.8 F (37.7 C)] 98 F (36.7 C) (11/26 0800) Pulse Rate:  [97-117] 115 (11/26 0900) Resp:  [13-25] 25 (11/26 0900) BP: (118-152)/(57-80) 133/73 (11/26 0900) SpO2:  [88 %-95 %] 88 % (11/26 0900) Last BM Date :  (PTA)  Intake/Output Summary (Last 24 hours) at 02/10/2022 0927 Last data filed at 02/10/2022 6812 Gross per 24 hour  Intake 2188.02 ml  Output 7275 ml  Net -5086.98 ml    Intake/Output from previous day: 11/25 0701 - 11/26 0700 In: 1937.8 [I.V.:1804.8; IV Piggyback:133.1] Out: 7517 [Urine:1025; Emesis/NG output:5600] Intake/Output this shift: Total I/O In: 250.2 [I.V.:221.7; IV Piggyback:28.5] Out: 650 [Emesis/NG output:650]   Physical Exam:  General: Alert male in NAD. Wife in room Heart:  sinus tachycardia.  Pulmonary: Normal respiratory effort Abdomen: Soft, nondistended.  Normal bowel sounds.  Neurologic: Alert and oriented Psych: Pleasant. Cooperative.    Principal Problem:   Perforated abdominal viscus     LOS: 2 days   Tye Savoy ,NP 02/10/2022, 9:27 AM

## 2022-02-10 NOTE — Progress Notes (Signed)
3 Days Post-Op Ex lap, L colectomy and colostomy Subjective: Feeling better, NG high but pt taking PO clears, having bowel function from ostomy  Objective: Vital signs in last 24 hours: Temp:  [97.4 F (36.3 C)-99.8 F (37.7 C)] 99.8 F (37.7 C) (11/26 0400) Pulse Rate:  [97-117] 107 (11/26 0700) Resp:  [13-22] 19 (11/26 0700) BP: (118-150)/(57-80) 129/63 (11/26 0700) SpO2:  [90 %-95 %] 92 % (11/26 0700)   Intake/Output from previous day: 11/25 0701 - 11/26 0700 In: 1937.8 [I.V.:1804.8; IV Piggyback:133.1] Out: 5009 [Urine:1025; Emesis/NG output:5600] Intake/Output this shift: No intake/output data recorded.   General appearance: alert and cooperative GI: normal findings: soft, ostomy viable  Incision: no significant drainage  Lab Results:  Recent Labs    02/09/22 0246 02/10/22 0252  WBC 10.1 8.9  HGB 10.1* 9.8*  HCT 29.0* 28.5*  PLT 320 282    BMET Recent Labs    02/09/22 0246 02/10/22 0252  NA 138 143  K 3.8 3.1*  CL 100 95*  CO2 28 40*  GLUCOSE 145* 104*  BUN 22 22  CREATININE 0.68 0.60*  CALCIUM 7.5* 7.8*    PT/INR Recent Labs    02/07/22 2030  LABPROT 15.8*  INR 1.3*    ABG No results for input(s): "PHART", "HCO3" in the last 72 hours.  Invalid input(s): "PCO2", "PO2"  MEDS, Scheduled  chlorhexidine  15 mL Mouth/Throat Once   Chlorhexidine Gluconate Cloth  6 each Topical Daily   insulin aspart  0-20 Units Subcutaneous Q4H   mouth rinse  15 mL Mouth Rinse 4 times per day    Studies/Results: No results found.  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   Hearing impairment 10/04/2013   Right shoulder pain 10/04/2013   Health maintenance examination 02/26/2013   Hyperlipidemia 06/15/2011   SEVERE OBSTRUCTIVE SLEEP APNEA with AHI 94 03/30/2010   Morbid obesity ([place]) 02/06/2010   Adult ADHD 03/07/2009   ALLERGIC RHINITIS 03/06/2009   DERMATITIS 03/06/2009    Expected post op course  Plan: Cont IV Abx x 5 days from surgery D/C NG and start clears now that he is having bowel function Ambulate, Pt very weak, PT consult ordered D/C foley today  [place] for xfer to floor from surgical standpoint IBD: awaiting path report, GI on board and following.  Hold on steroids for now to allow for post surgical healing   LOS: 2 days     .Rosario Adie, [facility], [place]    02/10/2022 8:09 AM

## 2022-02-11 ENCOUNTER — Inpatient Hospital Stay (HOSPITAL_COMMUNITY): Payer: 59

## 2022-02-11 DIAGNOSIS — R6520 Severe sepsis without septic shock: Secondary | ICD-10-CM | POA: Diagnosis not present

## 2022-02-11 DIAGNOSIS — K668 Other specified disorders of peritoneum: Secondary | ICD-10-CM | POA: Diagnosis not present

## 2022-02-11 DIAGNOSIS — R198 Other specified symptoms and signs involving the digestive system and abdomen: Secondary | ICD-10-CM | POA: Diagnosis not present

## 2022-02-11 DIAGNOSIS — A419 Sepsis, unspecified organism: Secondary | ICD-10-CM | POA: Diagnosis not present

## 2022-02-11 LAB — CBC WITH DIFFERENTIAL/PLATELET
Abs Immature Granulocytes: 0.09 10*3/uL — ABNORMAL HIGH (ref 0.00–0.07)
Basophils Absolute: 0 10*3/uL (ref 0.0–0.1)
Basophils Relative: 0 %
Eosinophils Absolute: 0 10*3/uL (ref 0.0–0.5)
Eosinophils Relative: 0 %
HCT: 29.9 % — ABNORMAL LOW (ref 39.0–52.0)
Hemoglobin: 9.8 g/dL — ABNORMAL LOW (ref 13.0–17.0)
Immature Granulocytes: 1 %
Lymphocytes Relative: 5 %
Lymphs Abs: 0.5 10*3/uL — ABNORMAL LOW (ref 0.7–4.0)
MCH: 33.6 pg (ref 26.0–34.0)
MCHC: 32.8 g/dL (ref 30.0–36.0)
MCV: 102.4 fL — ABNORMAL HIGH (ref 80.0–100.0)
Monocytes Absolute: 0.3 10*3/uL (ref 0.1–1.0)
Monocytes Relative: 2 %
Neutro Abs: 10 10*3/uL — ABNORMAL HIGH (ref 1.7–7.7)
Neutrophils Relative %: 92 %
Platelets: 276 10*3/uL (ref 150–400)
RBC: 2.92 MIL/uL — ABNORMAL LOW (ref 4.22–5.81)
RDW: 13.5 % (ref 11.5–15.5)
WBC: 10.9 10*3/uL — ABNORMAL HIGH (ref 4.0–10.5)
nRBC: 0 % (ref 0.0–0.2)

## 2022-02-11 LAB — GLUCOSE, CAPILLARY
Glucose-Capillary: 86 mg/dL (ref 70–99)
Glucose-Capillary: 133 mg/dL — ABNORMAL HIGH (ref 70–99)
Glucose-Capillary: 121 mg/dL — ABNORMAL HIGH (ref 70–99)
Glucose-Capillary: 125 mg/dL — ABNORMAL HIGH (ref 70–99)
Glucose-Capillary: 114 mg/dL — ABNORMAL HIGH (ref 70–99)

## 2022-02-11 LAB — COMPREHENSIVE METABOLIC PANEL
ALT: 15 U/L (ref 0–44)
AST: 21 U/L (ref 15–41)
Albumin: 1.6 g/dL — ABNORMAL LOW (ref 3.5–5.0)
Alkaline Phosphatase: 67 U/L (ref 38–126)
Anion gap: 10 (ref 5–15)
BUN: 15 mg/dL (ref 8–23)
CO2: 42 mmol/L — ABNORMAL HIGH (ref 22–32)
Calcium: 7.9 mg/dL — ABNORMAL LOW (ref 8.9–10.3)
Chloride: 91 mmol/L — ABNORMAL LOW (ref 98–111)
Creatinine, Ser: 0.6 mg/dL — ABNORMAL LOW (ref 0.61–1.24)
GFR, Estimated: >60 mL/min (ref >60)
Glucose, Bld: 86 mg/dL (ref 70–99)
Potassium: 2.9 mmol/L — ABNORMAL LOW (ref 3.5–5.1)
Sodium: 143 mmol/L (ref 135–145)
Total Bilirubin: 0.6 mg/dL (ref 0.3–1.2)
Total Protein: 5.6 g/dL — ABNORMAL LOW (ref 6.5–8.1)

## 2022-02-11 LAB — MAGNESIUM: Magnesium: 2 mg/dL (ref 1.7–2.4)

## 2022-02-11 LAB — PHOSPHORUS: Phosphorus: 4.1 mg/dL (ref 2.5–4.6)

## 2022-02-11 MED ORDER — POTASSIUM CHLORIDE 10 MEQ/100ML IV SOLN
10.0000 meq | INTRAVENOUS | Status: AC
  Administered 2022-02-11 (×6): 10 meq via INTRAVENOUS
  Filled 2022-02-11 (×6): qty 100

## 2022-02-11 MED ORDER — POTASSIUM CHLORIDE 10 MEQ/100ML IV SOLN
10.0000 meq | INTRAVENOUS | Status: AC
  Administered 2022-02-11 (×2): 10 meq via INTRAVENOUS
  Filled 2022-02-11 (×2): qty 100

## 2022-02-11 MED ORDER — ORAL CARE MOUTH RINSE
15.0000 mL | OROMUCOSAL | Status: DC | PRN
  Administered 2022-02-16: 15 mL via OROMUCOSAL

## 2022-02-11 NOTE — Progress Notes (Signed)
PROGRESS NOTE    Richard [name]  SEG:[id number] DOB: 1955/10/12 DOA: 02/07/2022 PCP: [name], MD     Brief Narrative:  66 y/o WM, PMHx Scrotal abscess August 2023, Colon polyp, Diverticulosis, ADHD, Anxiety, Rhinitis, COVID July 2022, HLD, HTN, OSA, PNA, Seborrheic dermatitis, Hypogonadism, DM type 2, Peripheral neuropathy, former smoker,   Developed diarrhea about 3 to 4 weeks prior to admission.  He was seen by Dr. [name] with GI.  He had colonoscopy on 02/04/22.  This showed diffuse, severe inflammation throughout the colon with mucosal friability and biopsies were performed.  He was found to have Crohn's colitis and started on prednisone.  He started having discomfort in his abdomen on 02/06/22.  This started in his lower abdomen and migrated toward his upper abdomen.  He started feeling bloated as well.  He had trouble getting off the commode due to feeling weak "like he was frozen".  His wife called EMS and he was brought to the ER.  He had fever 102.1 F with tachycardia.  CT abd/pelvis showed findings compatible with perforated bowel with moderate free fluid and free air.  He was started on antibiotics and IV fluids.  Surgery consulted to assess for emergent laparotomy.  PCCM consulted to admit to ICU.    Subjective: 11/27 Tmax overnight 39.1 C.  A/O x4.  Positive abdominal discomfort.   Assessment & Plan: Covid vaccination;   Principal Problem:   Perforated abdominal viscus  Acute peritonitis with sepsis from perforated abdominal viscus -11/24. s/p emergent laparotomy Recent colonoscopy with diagnosis of Crohn's colitis.   -NPO x ice chips -NGT in place LIS -PRN dilaudid for pain  -continue zosyn -follow culture results  -[facility] consulted to assist in ongoing management of Chron's Colitis  -Lactated Ringer's 157m/hr -11/27 NG tube urgently replaced 20047mbilious fluid immediately suctioned.  Consult to maintain NG tube in place until PRIMARY TEAM  removed.   At Risk AKI  In setting of contrast administration -remove foley if sr cr remains stable  -Strict in and out -4.8 L - Daily weight Filed Weights   02/08/22 0500  Weight: 109.1 kg  -Avoid nephrotoxic agents, ensure adequate renal perfusion   Essential HTN - Hold lisinopril  HLD -hold Lipitor    DM type 2 poorly controlled with hyperglycemia. -11/8 Hemoglobin A1c= 6.6 -hold outpatient metformin, jardiance CBG (last 3)  Recent Labs    02/10/22 2333 02/11/22 0339 02/11/22 0749  GLUCAP 131* 86 133*   -Resistant SSI   OSA Baseline CPAP 10 cm H20, followed by Dr. OlAnder SladeCPAP QSH   -NGT may impede mask seal    Hx of ADHD, Anxiety. -hold home methylphenidate    Hx of Hypogonadism. -hold outpatient testosterone  -11/25 testosterone level pending   Moderate Protein Calorie Malnutrition (POA) -nutrition when ok per CCS   Obesity (BMI 35.7 to m/kg) -Address as outpatient  Hypokalemia - Potassium goal> 4 - 11/26 Potassium IV 60 mEq -11/27 Potassium IV 80 mEq      Mobility Assessment (last 72 hours)     Mobility Assessment   No documentation.                    DVT prophylaxis: SCD Code Status: Full Family Communication: 11/27 wife at bedside for discussion of plan of care all questions answered Status is: Inpatient    Dispo: The patient is from: Home              Anticipated d/c is  to: Home              Anticipated d/c date is: > 3 days              Patient currently is not medically stable to d/c.      Consultants:  PCCM CCS    Procedures/Significant Events:  11/23 s/p exploratory lap, sigmoid resection and transverse colostomy      I have personally reviewed and interpreted all radiology studies and my findings are as above.  VENTILATOR SETTINGS: Flow 5 L/min 11/27 SPO2 98%   Cultures 11/23 ex lap Path pending 11/25 urine pending 11/27 QuantiFERON gold pending   Antimicrobials: Anti-infectives (From  admission, onward)    Start     Ordered Stop   02/08/22 1200  piperacillin-tazobactam (ZOSYN) IVPB 3.375 g        02/08/22 0505     02/08/22 0500  piperacillin-tazobactam (ZOSYN) IVPB 3.375 g  Status:  Discontinued        02/08/22 0006 02/08/22 0505   02/08/22 0334  piperacillin-tazobactam (ZOSYN) 3.375 GM/50ML IVPB       Note to Pharmacy: Enis Gash W: cabinet override   02/08/22 0334 02/08/22 0345   02/07/22 2030  ceFEPIme (MAXIPIME) 2 g in sodium chloride 0.9 % 100 mL IVPB        02/07/22 2023 02/07/22 2248   02/07/22 2030  metroNIDAZOLE (FLAGYL) IVPB 500 mg        02/07/22 2023 02/08/22 0506   02/07/22 2030  vancomycin (VANCOCIN) IVPB 1000 mg/200 mL premix        02/07/22 2023 02/08/22 0506         Devices    LINES / TUBES:      Continuous Infusions:  lactated ringers 100 mL/hr at 02/11/22 1000   piperacillin-tazobactam (ZOSYN)  IV Stopped (02/11/22 5638)   potassium chloride 10 mEq (02/11/22 1001)     Objective: Vitals:   02/11/22 0555 02/11/22 0600 02/11/22 0700 02/11/22 0800  BP:  120/63 96/62   Pulse:  (!) 110 (!) 108   Resp:  18 20   Temp: (!) 101.4 F (38.6 C)   98.7 F (37.1 C)  TempSrc: Oral   Oral  SpO2:  96% 94%   Weight:      Height:        Intake/Output Summary (Last 24 hours) at 02/11/2022 1030 Last data filed at 02/11/2022 1000 Gross per 24 hour  Intake 2993.36 ml  Output 5075 ml  Net -2081.64 ml    Filed Weights   02/08/22 0500  Weight: 109.1 kg    Examination:  General: A/O x4, No acute respiratory distress Eyes: negative scleral hemorrhage, negative anisocoria, negative icterus ENT: Negative Runny nose, negative gingival bleeding, Neck:  Negative scars, masses, torticollis, lymphadenopathy, JVD Lungs: Clear to auscultation bilaterally without wheezes or crackles Cardiovascular: Tachycardic without murmur gallop or rub normal S1 and S2 Abdomen: Positive abdominal pain, nondistended, positive soft, bowel sounds, no  rebound, no ascites, no appreciable mass, midline ex lap incision covered in clean no sign of leakage.  Ostomy pouch in place. Extremities: No significant cyanosis, clubbing, or edema bilateral lower extremities Skin: Negative rashes, lesions, ulcers Psychiatric:  Negative depression, negative anxiety, negative fatigue, negative mania  Central nervous system:  Cranial nerves II through XII intact, tongue/uvula midline, all extremities muscle strength 5/5, sensation intact throughout, negative dysarthria, negative expressive aphasia, negative receptive aphasia.  .     Data Reviewed: Care during the described time interval was  provided by me .  I have reviewed this patient's available data, including medical history, events of note, physical examination, and all test results as part of my evaluation.  CBC: Recent Labs  Lab 02/04/22 1038 02/07/22 2030 02/07/22 2031 02/08/22 0726 02/09/22 0246 02/10/22 0252 02/11/22 0249  WBC 4.4 2.2*  --  7.0 10.1 8.9 10.9*  NEUTROABS 3.1 1.8  --   --   --  8.2* 10.0*  HGB 9.9* 12.9* 13.9 10.5* 10.1* 9.8* 9.8*  HCT 28.8* 38.1* 41.0 31.7* 29.0* 28.5* 29.9*  MCV 102.4* 101.6*  --  102.9* 98.6 99.3 102.4*  PLT 263.0 404*  --  302 320 282 440    Basic Metabolic Panel: Recent Labs  Lab 02/07/22 2030 02/07/22 2031 02/08/22 0726 02/09/22 0246 02/10/22 0252 02/11/22 0249  NA 133* 131* 134* 138 143 143  K 4.0 3.9 4.4 3.8 3.1* 2.9*  CL 96* 95* 104 100 95* 91*  CO2 19*  --  16* 28 40* 42*  GLUCOSE 155* 155* 158* 145* 104* 86  BUN '16 15 17 22 22 15  '$ CREATININE 0.67 0.50* 0.65 0.68 0.60* 0.60*  CALCIUM 8.3*  --  7.3* 7.5* 7.8* 7.9*  MG  --   --   --   --  2.2 2.0  PHOS  --   --   --   --  3.0 4.1    GFR: Estimated Creatinine Clearance: 112.1 mL/min (A) (by C-G formula based on SCr of 0.6 mg/dL (L)). Liver Function Tests: Recent Labs  Lab 02/07/22 2030 02/08/22 0726 02/10/22 0252 02/11/22 0249  AST 40 '22 19 21  '$ ALT '29 18 17 15  '$ ALKPHOS 87  44 59 67  BILITOT 1.4* 1.6* 0.7 0.6  PROT 7.2 4.7* 5.4* 5.6*  ALBUMIN 2.3* 1.8* 1.6* 1.6*    No results for input(s): "LIPASE", "AMYLASE" in the last 168 hours. No results for input(s): "AMMONIA" in the last 168 hours. Coagulation Profile: Recent Labs  Lab 02/07/22 2030  INR 1.3*    Cardiac Enzymes: Recent Labs  Lab 02/07/22 2255  CKTOTAL 231    BNP (last 3 results) No results for input(s): "PROBNP" in the last 8760 hours. HbA1C: No results for input(s): "HGBA1C" in the last 72 hours. CBG: Recent Labs  Lab 02/10/22 1605 02/10/22 1934 02/10/22 2333 02/11/22 0339 02/11/22 0749  GLUCAP 235* 162* 131* 86 133*    Lipid Profile: No results for input(s): "CHOL", "HDL", "LDLCALC", "TRIG", "CHOLHDL", "LDLDIRECT" in the last 72 hours. Thyroid Function Tests: No results for input(s): "TSH", "T4TOTAL", "FREET4", "T3FREE", "THYROIDAB" in the last 72 hours. Anemia Panel: No results for input(s): "VITAMINB12", "FOLATE", "FERRITIN", "TIBC", "IRON", "RETICCTPCT" in the last 72 hours. Sepsis Labs: Recent Labs  Lab 02/07/22 2030 02/07/22 2224  LATICACIDVEN 2.3* 1.3     Recent Results (from the past 240 hour(s))  Urine Culture     Status: Abnormal   Collection Time: 02/07/22  9:14 AM   Specimen: In/Out Cath Urine  Result Value Ref Range Status   Specimen Description   Final    IN/OUT CATH URINE Performed at [facility], [place] [address], [place], [place] [address]    Special Requests   Final    NONE Performed at [facility], [place] [address] [place], [place] [address]    Culture (A)  Final    1,000 COLONIES/mL STAPHYLOCOCCUS EPIDERMIDIS CALL MICROBIOLOGY LAB IF SENSITIVITIES ARE REQUIRED. Performed at [facility], [place] [address] [place], [place]  [address]    Report Status 02/09/2022 FINAL  Final  Blood Culture (routine x 2)     Status: None (Preliminary result)   Collection Time: 02/07/22  8:29 PM   Specimen: BLOOD   Result Value Ref Range Status   Specimen Description   Final    BLOOD LEFT ANTECUBITAL Performed at [facility] [address] [place], [place] [address]    Special Requests   Final    BOTTLES DRAWN AEROBIC AND ANAEROBIC Blood Culture results may not be optimal due to an excessive volume of blood received in culture bottles Performed at [facility] [address], [place], [place] [address]    Culture   Final    NO GROWTH 4 DAYS Performed at [facility], [place] [address] [place], [place] [address]    Report Status PENDING  Incomplete  Blood Culture (routine x 2)     Status: None (Preliminary result)   Collection Time: 02/07/22  8:30 PM   Specimen: BLOOD LEFT ARM  Result Value Ref Range Status   Specimen Description   Final    BLOOD LEFT ARM Performed at [facility], [place] [address], [place], [place] [address]    Special Requests   Final    BOTTLES DRAWN AEROBIC AND ANAEROBIC Blood Culture adequate volume Performed at [facility] [address] [place], [place] [address]    Culture   Final    NO GROWTH 4 DAYS Performed at [facility], [place] [address], [place],  [address]    Report Status PENDING  Incomplete  Resp Panel by RT-PCR (Flu A&B, Covid) Anterior Nasal Swab     Status: None   Collection Time: 02/07/22  9:25 PM   Specimen: Anterior Nasal Swab  Result Value Ref Range Status   SARS Coronavirus 2 by RT PCR NEGATIVE NEGATIVE Final    Comment: (NOTE) SARS-CoV-2 target nucleic acids are NOT DETECTED.  The SARS-CoV-2 RNA is generally detectable in upper respiratory specimens during the acute phase of infection. The lowest concentration of SARS-CoV-2 viral copies this assay can detect is 138 copies/mL. A negative result does not preclude SARS-Cov-2 infection and should not be used as the sole basis for treatment or other patient management decisions. A negative result may occur with   improper specimen collection/handling, submission of specimen other than nasopharyngeal swab, presence of viral mutation(s) within the areas targeted by this assay, and inadequate number of viral copies(<138 copies/mL). A negative result must be combined with clinical observations, patient history, and epidemiological information. The expected result is Negative.  Fact Sheet for Patients:  EntrepreneurPulse.com.au  Fact Sheet for Healthcare Providers:  IncredibleEmployment.be  This test is no t yet approved or cleared by the [place] FDA and  has been authorized for detection and/or diagnosis of SARS-CoV-2 by FDA under an Emergency Use Authorization (EUA). This EUA will remain  in effect (meaning this test can be used) for the duration of the COVID-19 declaration under Section 564(b)(1) of the Act, 21 U.S.C.section 360bbb-3(b)(1), unless the authorization is terminated  or revoked sooner.       Influenza A by PCR NEGATIVE NEGATIVE Final   Influenza B by PCR NEGATIVE NEGATIVE Final    Comment: (NOTE) The Xpert Xpress SARS-CoV-2/FLU/RSV plus assay is intended as an aid in the diagnosis of influenza from Nasopharyngeal swab specimens and should not be used as a sole basis for treatment. Nasal washings and aspirates are unacceptable  for Xpert Xpress SARS-CoV-2/FLU/RSV testing.  Fact Sheet for Patients: EntrepreneurPulse.com.au  Fact Sheet for Healthcare Providers: IncredibleEmployment.be  This test is not yet approved or cleared by the [place] FDA and has been authorized for detection and/or diagnosis of SARS-CoV-2 by FDA under an Emergency Use Authorization (EUA). This EUA will remain in effect (meaning this test can be used) for the duration of the COVID-19 declaration under Section 564(b)(1) of the Act, 21 U.S.C. section 360bbb-3(b)(1), unless the authorization is terminated  or revoked.  Performed at [facility], [place] [address], [place], [place] [address]   MRSA Next Gen by PCR, Nasal     Status: None   Collection Time: 02/08/22  4:41 AM   Specimen: Nasal Mucosa; Nasal Swab  Result Value Ref Range Status   MRSA by PCR Next Gen NOT DETECTED NOT DETECTED Final    Comment: (NOTE) The GeneXpert MRSA Assay (FDA approved for NASAL specimens only), is one component of a comprehensive MRSA colonization surveillance program. It is not intended to diagnose MRSA infection nor to guide or monitor treatment for MRSA infections. Test performance is not FDA approved in patients less than 41 years old. Performed at [facility], [place] [address] [place], [place] [address]   Urine Culture     Status: None   Collection Time: 02/09/22 12:56 PM   Specimen: Urine, Clean Catch  Result Value Ref Range Status   Specimen Description   Final    URINE, CLEAN CATCH Performed at [facility], [place] [address], [place], [place] [address]    Special Requests   Final    NONE Performed at [facility], [place] [address] [place], [place] [address]    Culture   Final    NO GROWTH Performed at [facility], [place] [address] [place], [place] [address]    Report Status 02/10/2022 FINAL  Final         Radiology Studies: DG CHEST PORT 1 VIEW  Result Date: 02/11/2022 CLINICAL DATA:  Fever. EXAM: PORTABLE CHEST 1 VIEW COMPARISON:  AP chest 02/07/2022, CT chest 02/07/2022 FINDINGS: An enteric tube descends below the diaphragm with the tip excluded by collimation, new from prior 02/07/2022 radiograph. Cardiac silhouette is again moderately enlarged. Mediastinal contours are within normal limits. Moderately decreased lung volumes. Lateral left lower lung horizontal linear subsegmental atelectasis versus scarring, similar to prior radiograph and CT. No pneumothorax. No definite pleural effusion. No acute  skeletal abnormality. IMPRESSION: 1. Moderately decreased lung volumes with unchanged left basilar subsegmental atelectasis. 2. New enteric tube descends below the diaphragm. Electronically Signed   By: Yvonne Kendall M.D.   On: 02/11/2022 09:48   DG Abd 1 View  Result Date: 02/10/2022 CLINICAL DATA:  Check gastric catheter placement EXAM: ABDOMEN - 1 VIEW COMPARISON:  None Available. FINDINGS: Gastric catheter is noted extending into the stomach. Scattered large and small bowel gas is noted. No free air is seen. IMPRESSION: Gastric catheter within the stomach. Electronically Signed   By: Inez Catalina M.D.   On: 02/10/2022 19:49        Scheduled Meds:  chlorhexidine  15 mL Mouth/Throat Once   Chlorhexidine Gluconate Cloth  6 each Topical Daily   insulin aspart  0-20 Units Subcutaneous Q4H   Continuous Infusions:  lactated ringers 100 mL/hr at 02/11/22 1000   piperacillin-tazobactam (ZOSYN)  IV Stopped (02/11/22 9326)   potassium chloride 10 mEq (02/11/22 1001)     LOS: 3  days    Time spent:40 min    Samariyah Cowles, Geraldo Docker, MD [facility]   If 7PM-7AM, please contact night-coverage 02/11/2022, 10:30 AM

## 2022-02-11 NOTE — Consult Note (Signed)
[facility] Nurse ostomy consult note Stoma type/location: end colostomy Stomal assessment/size: 2 1/2" round, budded, tension along medial aspect of mucocutaneous junction  Peristomal assessment: intact, mild contact dermatitis; along tape border  Treatment options for stomal/peristomal skin: 2" skin barrier ring Output liquid brown stool  Ostomy pouching: 2pc. 2 3/4" used with 2" skin barrier ring, however opening can only be cut to 2 1/4 and I feel like this is a little too snug around stoma, would prefer to have a little clearance between stoma and cut to fit skin barrier  Education provided:  Explained role of ostomy nurse and creation of stoma  Explained stoma characteristics (budded, flush, color, texture, care) Demonstrated pouch change (cutting new skin barrier, measuring stoma, cleaning peristomal skin and stoma, use of barrier ring) Education on emptying when 1/3 to 1/2 full and how to empty Demonstrated use of wick to clean spout  Wife at bedside she is engaged to learn and very supportive of care.  Educational materials left and reviewed.    Enrolled patient in [place] program: Yes  [place] Nurse will follow along with you for continued support with ostomy teaching and care [place] MSN, [place], [place], [place], [place]

## 2022-02-11 NOTE — Evaluation (Addendum)
Physical Therapy Evaluation Patient Details Name: Richard [name] MRN: [id number] DOB: [birth year] Today's Date: 02/11/2022  History of Present Illness  66yo  male with newly diagnosed IBD ( probably Crohn's colitis)admitted 02/07/22  with bowel perforation , s/p exploratory lap, sigmoid resection and transverse colostomy on 11/23  Clinical Impression  Pt admitted with above diagnosis.  Pt currently with functional limitations due to the deficits listed below (see PT Problem List). Pt will benefit from skilled PT to increase their independence and safety with mobility to allow discharge to the venue listed below.    The patient reports that he is ready to get OOB and room. Patient  required mod assistance to sit up to bed edge, Using Rw, able  to step to recliner with min assist and +1 for lines.  Patient has multiple lines and NG to suction so not able to mobilize beyond  recliner. Patient should  progress to return home.  Patient's wife present.  SPO2 dropped to 88 % on RA at rest so remained on  4 LPM for activity. HR noted up to 127  , BP stable, no dizziness.         Recommendations for follow up therapy are one component of a multi-disciplinary discharge planning process, led by the attending physician.  Recommendations may be updated based on patient status, additional functional criteria and insurance authorization.  Follow Up Recommendations Home health PT      Assistance Recommended at Discharge Frequent or constant Supervision/Assistance  Patient can return home with the following  Two people to help with walking and/or transfers;A lot of help with bathing/dressing/bathroom;Assistance with cooking/housework;Assist for transportation;Help with stairs or ramp for entrance    Equipment Recommendations Rolling walker (2 wheels)  Recommendations for Other Services  OT consult    Functional Status Assessment Patient has had a recent decline in their functional status and demonstrates  the ability to make significant improvements in function in a reasonable and predictable amount of time.     Precautions / Restrictions Precautions Precaution Comments: NG suction, needs O2, Required Braces or Orthoses:  (abdominal binder) Placed in supine\ Left colostomy     Mobility  Bed Mobility Overal bed mobility: Needs Assistance Bed Mobility: Rolling, Sidelying to Sit Rolling: Mod assist Sidelying to sit: Mod assist, +2 for safety/equipment       General bed mobility comments: moves slowly, able to roll and pull on therapist, mod assist to move to upright    Transfers Overall transfer level: Needs assistance Equipment used: Rolling walker (2 wheels) Transfers: Sit to/from Stand, Bed to chair/wheelchair/BSC Sit to Stand: Min assist, +2 safety/equipment, From elevated surface   Step pivot transfers: Min assist, +2 safety/equipment       General transfer comment: able to steady and step to recliner, +2 for lines and tubes/safety    Ambulation/Gait                  Stairs            Wheelchair Mobility    Modified Rankin (Stroke Patients Only)       Balance Overall balance assessment: Needs assistance Sitting-balance support: Bilateral upper extremity supported, Feet supported Sitting balance-Leahy Scale: Fair     Standing balance support: Bilateral upper extremity supported, During functional activity, Reliant on assistive device for balance Standing balance-Leahy Scale: Poor  Pertinent Vitals/Pain Pain Assessment Pain Assessment: 0-10 Faces Pain Scale: Hurts little more Pain Location: abdomen when rolling and sitting up Pain Descriptors / Indicators: Grimacing, Guarding, Discomfort Pain Intervention(s): Monitored during session, Repositioned, Limited activity within patient's tolerance    Home Living Family/patient expects to be discharged to:: Private residence Living Arrangements:  Spouse/significant other Available Help at Discharge: Family Type of Home: House Home Access: Stairs to enter Entrance Stairs-Rails: Psychiatric nurse of Steps: 5 Alternate Level Stairs-Number of Steps: 15 down to his  office Home Layout: Two level Home Equipment: None      Prior Function Prior Level of Function : Independent/Modified Independent             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 ,       Journalist, newspaper   Dominant Hand: Right    Extremity/Trunk Assessment   Upper Extremity Assessment Upper Extremity Assessment: Overall WFL for tasks assessed    Lower Extremity Assessment Lower Extremity Assessment: RLE deficits/detail;LLE deficits/detail RLE Sensation: history of peripheral neuropathy LLE Sensation: history of peripheral neuropathy    Cervical / Trunk Assessment Cervical / Trunk Assessment: Other exceptions Cervical / Trunk Exceptions: guarded- surgery  Communication   Communication: HOH  Cognition Arousal/Alertness: Awake/alert Behavior During Therapy: WFL for tasks assessed/performed Overall Cognitive Status: Within Functional Limits for tasks assessed                                          General Comments      Exercises     Assessment/Plan    PT Assessment Patient needs continued PT services  PT Problem List Decreased strength;Decreased activity tolerance;Decreased mobility;Cardiopulmonary status limiting activity;Decreased knowledge of precautions;Pain;Impaired sensation       PT Treatment Interventions DME instruction;Functional mobility training;Therapeutic activities;Patient/family education    PT Goals (Current goals can be found in the Care Plan section)  Acute Rehab PT Goals Patient Stated Goal: to go outside, get out of room. PT Goal Formulation: With patient/family Time For Goal Achievement: 02/25/22 Potential to Achieve Goals: Good    Frequency Min 3X/week     Co-evaluation                AM-PAC PT "6 Clicks" Mobility  Outcome Measure Help needed turning from your back to your side while in a flat bed without using bedrails?: A Lot Help needed moving from lying on your back to sitting on the side of a flat bed without using bedrails?: A Lot Help needed moving to and from a bed to a chair (including a wheelchair)?: A Lot Help needed standing up from a chair using your arms (e.g., wheelchair or bedside chair)?: A Lot Help needed to walk in hospital room?: A Lot Help needed climbing 3-5 steps with a railing? : Total 6 Click Score: 11    End of Session Equipment Utilized During Treatment: Oxygen Activity Tolerance: Patient tolerated treatment well Patient left: in chair;with chair alarm set;with call bell/phone within reach;with family/visitor present Nurse Communication: Mobility status PT Visit Diagnosis: Difficulty in walking, not elsewhere classified (R26.2);Pain    Time: 1010-1050 PT Time Calculation (min) (ACUTE ONLY): 40 min   Charges:   PT Evaluation $PT Eval Moderate Complexity: 1 Mod PT Treatments $Therapeutic Activity: 23-37 mins        Tresa Endo PT Acute Rehabilitation Services Office [phone] Weekend [phone]   Claretha Cooper 02/11/2022, 1:19  PM

## 2022-02-11 NOTE — Progress Notes (Addendum)
    OVERNIGHT PROGRESS REPORT  Notified for "Indeterminate " value on Quantiferon TB test. Redraw required . Original test is from GI procedural Lab. Resulted currently by lab notification::      Component Ref Range & Units 7 d ago  QuantiFERON-TB Gold Plus NEGATIVE INDETERMINATE Abnormal        Reordered as advised, Precautions ordered and in place.   Gershon Cull MSNA MSN ACNPC-AG Acute Care Nurse Practitioner [place]

## 2022-02-11 NOTE — Progress Notes (Signed)
4 Days Post-Op Ex lap, L colectomy and colostomy Subjective: Pt and wife state he looked much better yesterday than today. Pt had emesis yesterday after ng was removed and then, once it was replaced, NG put out over 2 cannister of dark bile. Patient does report some stool in ostomy pouch but not really any gas. Wife reports his abdomen is more distended and his incision looks bigger. Pt expressed concern about missing work deadlines.    Objective: Vital signs in last 24 hours: Temp:  [97.5 F (36.4 C)-102.4 F (39.1 C)] 98.7 F (37.1 C) (11/27 0800) Pulse Rate:  [94-127] 108 (11/27 0700) Resp:  [13-27] 20 (11/27 0700) BP: (96-162)/(61-96) 96/62 (11/27 0700) SpO2:  [89 %-98 %] 94 % (11/27 0700)   Intake/Output from previous day: 11/26 0701 - 11/27 0700 In: 3047.9 [P.O.:420; I.V.:1735.6; IV Piggyback:892.2] Out: 9485 [Urine:1350; Emesis/NG output:4000; Stool:375] Intake/Output this shift: No intake/output data recorded.   Gen: alert, NAD  CV: HR !02 bpm  Pulm: normal effort on 5L [place] Abd: soft, protuberant, midline incision c/d/I with fascial sutures in tact. End colostomy with viable, edematous stoma, some light brown stool in pouch. No gas. Appropriately tender without peritonitis  NG- 800 cc  Ostomy -375 cc documented in epic last 24h  Lab Results:  Recent Labs    02/10/22 0252 02/11/22 0249  WBC 8.9 10.9*  HGB 9.8* 9.8*  HCT 28.5* 29.9*  PLT 282 276   BMET Recent Labs    02/10/22 0252 02/11/22 0249  NA 143 143  K 3.1* 2.9*  CL 95* 91*  CO2 40* 42*  GLUCOSE 104* 86  BUN 22 15  CREATININE 0.60* 0.60*  CALCIUM 7.8* 7.9*   PT/INR No results for input(s): "LABPROT", "INR" in the last 72 hours. ABG No results for input(s): "PHART", "HCO3" in the last 72 hours.  Invalid input(s): "PCO2", "PO2"  MEDS, Scheduled  chlorhexidine  15 mL Mouth/Throat Once   Chlorhexidine Gluconate Cloth  6 each Topical Daily   insulin aspart  0-20 Units Subcutaneous Q4H     Studies/Results: DG Abd 1 View  Result Date: 02/10/2022 CLINICAL DATA:  Check gastric catheter placement EXAM: ABDOMEN - 1 VIEW COMPARISON:  None Available. FINDINGS: Gastric catheter is noted extending into the stomach. Scattered large and small bowel gas is noted. No free air is seen. IMPRESSION: Gastric catheter within the stomach. Electronically Signed   By: Inez Catalina M.D.   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 [name] - await path - TMAX101.4, tachycardic, WBC 10.9 (up from 8)  - NG removed yesterday, replaced for nausea and  emesis. Continue NG to LIWS today and allow a few bites of ice chips. Await further bowel function. - Cont IV Abx x 5 days from surgery - PT/OT  IBD: awaiting path report, GI on board and following.  Hold on steroids for now to allow for post surgical healing  FEN: NPO, IVF, NG LIWS, ok for limited ICE ID: Zosyn 11/24 >> ; febrile, tachycardic, WBC  10.9; check CXR today VTE: SCDs, hgb stable - ok for chemical DVT ppx from CCS standpoint Foley: removed yesterday Dispo: ICU    LOS: 3 days     Obie Dredge, [facility], [place]    02/11/2022 9:25 AM

## 2022-02-12 ENCOUNTER — Inpatient Hospital Stay (HOSPITAL_COMMUNITY): Payer: 59

## 2022-02-12 ENCOUNTER — Encounter (HOSPITAL_COMMUNITY): Payer: Self-pay | Admitting: Pulmonary Disease

## 2022-02-12 DIAGNOSIS — K668 Other specified disorders of peritoneum: Secondary | ICD-10-CM | POA: Diagnosis not present

## 2022-02-12 DIAGNOSIS — R198 Other specified symptoms and signs involving the digestive system and abdomen: Secondary | ICD-10-CM | POA: Diagnosis not present

## 2022-02-12 DIAGNOSIS — K567 Ileus, unspecified: Secondary | ICD-10-CM | POA: Diagnosis not present

## 2022-02-12 DIAGNOSIS — A419 Sepsis, unspecified organism: Secondary | ICD-10-CM | POA: Diagnosis not present

## 2022-02-12 LAB — CBC WITH DIFFERENTIAL/PLATELET
Abs Immature Granulocytes: 0.07 10*3/uL (ref 0.00–0.07)
Basophils Absolute: 0 10*3/uL (ref 0.0–0.1)
Basophils Relative: 0 %
Eosinophils Absolute: 0 10*3/uL (ref 0.0–0.5)
Eosinophils Relative: 0 %
HCT: 27.6 % — ABNORMAL LOW (ref 39.0–52.0)
Hemoglobin: 9 g/dL — ABNORMAL LOW (ref 13.0–17.0)
Immature Granulocytes: 1 %
Lymphocytes Relative: 3 %
Lymphs Abs: 0.3 10*3/uL — ABNORMAL LOW (ref 0.7–4.0)
MCH: 33.5 pg (ref 26.0–34.0)
MCHC: 32.6 g/dL (ref 30.0–36.0)
MCV: 102.6 fL — ABNORMAL HIGH (ref 80.0–100.0)
Monocytes Absolute: 0.2 10*3/uL (ref 0.1–1.0)
Monocytes Relative: 2 %
Neutro Abs: 8.2 10*3/uL — ABNORMAL HIGH (ref 1.7–7.7)
Neutrophils Relative %: 94 %
Platelets: 251 10*3/uL (ref 150–400)
RBC: 2.69 MIL/uL — ABNORMAL LOW (ref 4.22–5.81)
RDW: 13.7 % (ref 11.5–15.5)
WBC: 8.8 10*3/uL (ref 4.0–10.5)
nRBC: 0 % (ref 0.0–0.2)

## 2022-02-12 LAB — MAGNESIUM: Magnesium: 2 mg/dL (ref 1.7–2.4)

## 2022-02-12 LAB — ACID FAST SMEAR (AFB, MYCOBACTERIA)
Acid Fast Smear: NEGATIVE
Source (AFB): UNDETERMINED

## 2022-02-12 LAB — COMPREHENSIVE METABOLIC PANEL
ALT: 14 U/L (ref 0–44)
AST: 18 U/L (ref 15–41)
Albumin: <1.5 g/dL — ABNORMAL LOW (ref 3.5–5.0)
Alkaline Phosphatase: 64 U/L (ref 38–126)
Anion gap: 12 (ref 5–15)
BUN: 16 mg/dL (ref 8–23)
CO2: 42 mmol/L — ABNORMAL HIGH (ref 22–32)
Calcium: 7.4 mg/dL — ABNORMAL LOW (ref 8.9–10.3)
Chloride: 87 mmol/L — ABNORMAL LOW (ref 98–111)
Creatinine, Ser: 0.67 mg/dL (ref 0.61–1.24)
GFR, Estimated: >60 mL/min (ref >60)
Glucose, Bld: 115 mg/dL — ABNORMAL HIGH (ref 70–99)
Potassium: 2.6 mmol/L — CL (ref 3.5–5.1)
Sodium: 141 mmol/L (ref 135–145)
Total Bilirubin: 0.9 mg/dL (ref 0.3–1.2)
Total Protein: 5.2 g/dL — ABNORMAL LOW (ref 6.5–8.1)

## 2022-02-12 LAB — QUANTIFERON-TB GOLD PLUS (RQFGPL)
QuantiFERON Mitogen Value: 0.9 IU/mL
QuantiFERON Nil Value: 0.76 IU/mL
QuantiFERON TB1 Ag Value: 0.72 IU/mL
QuantiFERON TB2 Ag Value: 0.77 IU/mL

## 2022-02-12 LAB — CULTURE, BLOOD (ROUTINE X 2)
Culture: NO GROWTH
Culture: NO GROWTH
Special Requests: ADEQUATE

## 2022-02-12 LAB — QUANTIFERON-TB GOLD PLUS: QuantiFERON-TB Gold Plus: UNDETERMINED — AB

## 2022-02-12 LAB — GLUCOSE, CAPILLARY
Glucose-Capillary: 110 mg/dL — ABNORMAL HIGH (ref 70–99)
Glucose-Capillary: 113 mg/dL — ABNORMAL HIGH (ref 70–99)
Glucose-Capillary: 120 mg/dL — ABNORMAL HIGH (ref 70–99)
Glucose-Capillary: 126 mg/dL — ABNORMAL HIGH (ref 70–99)
Glucose-Capillary: 130 mg/dL — ABNORMAL HIGH (ref 70–99)
Glucose-Capillary: 139 mg/dL — ABNORMAL HIGH (ref 70–99)
Glucose-Capillary: 152 mg/dL — ABNORMAL HIGH (ref 70–99)

## 2022-02-12 LAB — SURGICAL PATHOLOGY

## 2022-02-12 LAB — PHOSPHORUS: Phosphorus: 3.1 mg/dL (ref 2.5–4.6)

## 2022-02-12 MED ORDER — POTASSIUM CHLORIDE 10 MEQ/100ML IV SOLN
10.0000 meq | INTRAVENOUS | Status: AC
  Administered 2022-02-12 (×6): 10 meq via INTRAVENOUS
  Filled 2022-02-12 (×6): qty 100

## 2022-02-12 MED ORDER — POTASSIUM CHLORIDE 10 MEQ/100ML IV SOLN: 10.0000 meq | INTRAVENOUS | Status: DC

## 2022-02-12 MED ORDER — ENOXAPARIN SODIUM 60 MG/0.6ML IJ SOSY
0.5000 mg/kg | PREFILLED_SYRINGE | Freq: Every day | INTRAMUSCULAR | Status: DC
  Administered 2022-02-12 – 2022-02-21 (×10): 55 mg via SUBCUTANEOUS
  Filled 2022-02-12 (×10): qty 0.6

## 2022-02-12 NOTE — Progress Notes (Signed)
NGT in place no CPAP

## 2022-02-12 NOTE — Progress Notes (Addendum)
PROGRESS NOTE    Richard [name]  XQJ:[id number] DOB: 01-01-1956 DOA: 02/07/2022 PCP: [name], MD     Brief Narrative:  66 y/o WM, PMHx Scrotal abscess August 2023, Colon polyp, Diverticulosis, ADHD, Anxiety, Rhinitis, COVID July 2022, HLD, HTN, OSA, PNA, Seborrheic dermatitis, Hypogonadism, DM type 2, Peripheral neuropathy, former smoker,   Developed diarrhea about 3 to 4 weeks prior to admission.  He was seen by Dr. [name] with GI.  He had colonoscopy on 02/04/22.  This showed diffuse, severe inflammation throughout the colon with mucosal friability and biopsies were performed.  He was found to have Crohn's colitis and started on prednisone.  He started having discomfort in his abdomen on 02/06/22.  This started in his lower abdomen and migrated toward his upper abdomen.  He started feeling bloated as well.  He had trouble getting off the commode due to feeling weak "like he was frozen".  His wife called EMS and he was brought to the ER.  He had fever 102.1 F with tachycardia.  CT abd/pelvis showed findings compatible with perforated bowel with moderate free fluid and free air.  He was started on antibiotics and IV fluids.  Surgery consulted to assess for emergent laparotomy.  PCCM consulted to admit to ICU.    Subjective: 11/28 Tmax overnight 38.6 C.  Tachycardic overnight.  A/O x4.  Negative nausea, negative abdominal pain with ice chips.    Assessment & Plan: Covid vaccination;   Principal Problem:   Perforated abdominal viscus  Acute peritonitis with sepsis from perforated abdominal viscus -11/24. s/p emergent laparotomy Recent colonoscopy with diagnosis of Crohn's colitis.   -NPO x ice chips -NGT in place LIS -PRN dilaudid for pain  -continue zosyn -follow culture results  -[facility] consulted to assist in ongoing management of Chron's Colitis  -Lactated Ringer's 139m/hr -11/27 NG tube urgently replaced 20031mbilious fluid immediately suctioned.  Consult to  maintain NG tube in place until PRIMARY TEAM removed. -11/28 NG tube clamped -11/28 patient with continued fever without leukocytosis.  CT abdomen and pelvis pending -11/28 ADDENDUM: Failed NG tube clamp trial.  Discussed case with PA in the [facility] Will begin TPN in the A.m. -11/28 PICC line order placed   At Risk AKI  In setting of contrast administration -remove foley if sr cr remains stable  -Strict in and out -5.3 L - Daily weight Filed Weights   02/08/22 0500  Weight: 109.1 kg  -Avoid nephrotoxic agents, ensure adequate renal perfusion   Essential HTN - Hold lisinopril  HLD -hold Lipitor    DM type 2 poorly controlled with hyperglycemia. -11/8 Hemoglobin A1c= 6.6 -hold outpatient metformin, jardiance CBG (last 3)  Recent Labs    02/12/22 0014 02/12/22 0119 02/12/22 0323  GLUCAP 152* 139* 110*   -Resistant SSI   OSA Baseline CPAP 10 cm H20, followed by Dr. OlAnder SladeCPAP QSH   -NGT may impede mask seal    Hx of ADHD, Anxiety. -hold home methylphenidate    Hx of Hypogonadism. -hold outpatient testosterone  -11/28 testosterone level pending   Moderate Protein Calorie Malnutrition (POA) -nutrition when ok per CCS   Obesity (BMI 35.7 to m/kg) -Address as outpatient  Hypokalemia - Potassium goal> 4 - 11/26 Potassium IV 60 mEq -11/27 Potassium IV 80 mEq  -11/28 Potassium IV 100 mEq     Mobility Assessment (last 72 hours)     Mobility Assessment     Row Name 02/11/22 1316  What is the highest level of mobility based on the progressive mobility assessment? Level 4 (Walks with assist in room) - Balance while marching in place and cannot step forward and back - Complete                         DVT prophylaxis: SCD Code Status: Full Family Communication: 11/28 wife at bedside for discussion of plan of care all questions answered Status is: Inpatient    Dispo: The patient is from: Home              Anticipated d/c is to:  Home              Anticipated d/c date is: > 3 days              Patient currently is not medically stable to d/c.      Consultants:  PCCM CCS    Procedures/Significant Events:  11/23 s/p exploratory lap, sigmoid resection and transverse colostomy      I have personally reviewed and interpreted all radiology studies and my findings are as above.  VENTILATOR SETTINGS: Flow 5 L/min 11/27 SPO2 98%   Cultures 11/23 ex lap Path pending 11/25 urine pending 11/27 QuantiFERON gold pending 11/27 blood NGTD 11/28 AFB sputum pending x2     Antimicrobials: Anti-infectives (From admission, onward)    Start     Ordered Stop   02/08/22 1200  piperacillin-tazobactam (ZOSYN) IVPB 3.375 g        02/08/22 0505     02/08/22 0500  piperacillin-tazobactam (ZOSYN) IVPB 3.375 g  Status:  Discontinued        02/08/22 0006 02/08/22 0505   02/08/22 0334  piperacillin-tazobactam (ZOSYN) 3.375 GM/50ML IVPB       Note to Pharmacy: Enis Gash W: cabinet override   02/08/22 0334 02/08/22 0345   02/07/22 2030  ceFEPIme (MAXIPIME) 2 g in sodium chloride 0.9 % 100 mL IVPB        02/07/22 2023 02/07/22 2248   02/07/22 2030  metroNIDAZOLE (FLAGYL) IVPB 500 mg        02/07/22 2023 02/08/22 0506   02/07/22 2030  vancomycin (VANCOCIN) IVPB 1000 mg/200 mL premix        02/07/22 2023 02/08/22 0506         Devices    LINES / TUBES:      Continuous Infusions:  lactated ringers 100 mL/hr at 02/12/22 0837   piperacillin-tazobactam (ZOSYN)  IV Stopped (02/12/22 0803)   potassium chloride 10 mEq (02/12/22 0846)     Objective: Vitals:   02/12/22 0500 02/12/22 0649 02/12/22 0700 02/12/22 0800  BP: (!) 101/41 (!) 116/51 107/64 108/63  Pulse: 98 97 (!) 109 (!) 106  Resp: 18 (!) '21 20 16  '$ Temp:    100.3 F (37.9 C)  TempSrc:    Oral  SpO2: 95% 95% 95% 95%  Weight:      Height:        Intake/Output Summary (Last 24 hours) at 02/12/2022 0919 Last data filed at 02/12/2022  9163 Gross per 24 hour  Intake 2663.46 ml  Output 4025 ml  Net -1361.54 ml    Filed Weights   02/08/22 0500  Weight: 109.1 kg    Examination:  General: A/O x4, No acute respiratory distress Eyes: negative scleral hemorrhage, negative anisocoria, negative icterus ENT: Negative Runny nose, negative gingival bleeding, Neck:  Negative scars, masses, torticollis, lymphadenopathy, JVD Lungs: Clear to auscultation bilaterally  without wheezes or crackles Cardiovascular: Tachycardic without murmur gallop or rub normal S1 and S2 Abdomen: Positive abdominal pain minimal, nondistended, positive soft, bowel sounds, no rebound, no ascites, no appreciable mass, midline ex lap incision covered in clean no sign of leakage.  Ostomy pouch in place. Extremities: No significant cyanosis, clubbing, or edema bilateral lower extremities Skin: Negative rashes, lesions, ulcers Psychiatric:  Negative depression, negative anxiety, negative fatigue, negative mania  Central nervous system:  Cranial nerves II through XII intact, tongue/uvula midline, all extremities muscle strength 5/5, sensation intact throughout, negative dysarthria, negative expressive aphasia, negative receptive aphasia.  .     Data Reviewed: Care during the described time interval was provided by me .  I have reviewed this patient's available data, including medical history, events of note, physical examination, and all test results as part of my evaluation.  CBC: Recent Labs  Lab 02/07/22 2030 02/07/22 2031 02/08/22 0726 02/09/22 0246 02/10/22 0252 02/11/22 0249 02/12/22 0300  WBC 2.2*  --  7.0 10.1 8.9 10.9* 8.8  NEUTROABS 1.8  --   --   --  8.2* 10.0* 8.2*  HGB 12.9*   < > 10.5* 10.1* 9.8* 9.8* 9.0*  HCT 38.1*   < > 31.7* 29.0* 28.5* 29.9* 27.6*  MCV 101.6*  --  102.9* 98.6 99.3 102.4* 102.6*  PLT 404*  --  302 320 282 276 251   < > = values in this interval not displayed.    Basic Metabolic Panel: Recent Labs  Lab  02/08/22 0726 02/09/22 0246 02/10/22 0252 02/11/22 0249 02/12/22 0300  NA 134* 138 143 143 141  K 4.4 3.8 3.1* 2.9* 2.6*  CL 104 100 95* 91* 87*  CO2 16* 28 40* 42* 42*  GLUCOSE 158* 145* 104* 86 115*  BUN '17 22 22 15 16  '$ CREATININE 0.65 0.68 0.60* 0.60* 0.67  CALCIUM 7.3* 7.5* 7.8* 7.9* 7.4*  MG  --   --  2.2 2.0 2.0  PHOS  --   --  3.0 4.1 3.1    GFR: Estimated Creatinine Clearance: 112.1 mL/min (by C-G formula based on SCr of 0.67 mg/dL). Liver Function Tests: Recent Labs  Lab 02/07/22 2030 02/08/22 0726 02/10/22 0252 02/11/22 0249 02/12/22 0300  AST 40 '22 19 21 18  '$ ALT '29 18 17 15 14  '$ ALKPHOS 87 44 59 67 64  BILITOT 1.4* 1.6* 0.7 0.6 0.9  PROT 7.2 4.7* 5.4* 5.6* 5.2*  ALBUMIN 2.3* 1.8* 1.6* 1.6* <1.5*    No results for input(s): "LIPASE", "AMYLASE" in the last 168 hours. No results for input(s): "AMMONIA" in the last 168 hours. Coagulation Profile: Recent Labs  Lab 02/07/22 2030  INR 1.3*    Cardiac Enzymes: Recent Labs  Lab 02/07/22 2255  CKTOTAL 231    BNP (last 3 results) No results for input(s): "PROBNP" in the last 8760 hours. HbA1C: No results for input(s): "HGBA1C" in the last 72 hours. CBG: Recent Labs  Lab 02/11/22 1606 02/11/22 2051 02/12/22 0014 02/12/22 0119 02/12/22 0323  GLUCAP 125* 114* 152* 139* 110*    Lipid Profile: No results for input(s): "CHOL", "HDL", "LDLCALC", "TRIG", "CHOLHDL", "LDLDIRECT" in the last 72 hours. Thyroid Function Tests: No results for input(s): "TSH", "T4TOTAL", "FREET4", "T3FREE", "THYROIDAB" in the last 72 hours. Anemia Panel: No results for input(s): "VITAMINB12", "FOLATE", "FERRITIN", "TIBC", "IRON", "RETICCTPCT" in the last 72 hours. Sepsis Labs: Recent Labs  Lab 02/07/22 2030 02/07/22 2224  LATICACIDVEN 2.3* 1.3     Recent Results (from the  past 240 hour(s))  Urine Culture     Status: Abnormal   Collection Time: 02/07/22  9:14 AM   Specimen: In/Out Cath Urine  Result Value Ref Range  Status   Specimen Description   Final    IN/OUT CATH URINE Performed at [facility] [address], [place], [place] [address]    Special Requests   Final    NONE Performed at [facility], [place] [address] [place], [place] [address]    Culture (A)  Final    1,000 COLONIES/mL STAPHYLOCOCCUS EPIDERMIDIS CALL MICROBIOLOGY LAB IF SENSITIVITIES ARE REQUIRED. Performed at [facility], [place] [address], [place], [place] [address]    Report Status 02/09/2022 FINAL  Final  Blood Culture (routine x 2)     Status: None   Collection Time: 02/07/22  8:29 PM   Specimen: BLOOD  Result Value Ref Range Status   Specimen Description   Final    BLOOD LEFT ANTECUBITAL Performed at [facility] [address] [place], [place] [address]    Special Requests   Final    BOTTLES DRAWN AEROBIC AND ANAEROBIC Blood Culture results may not be optimal due to an excessive volume of blood received in culture bottles Performed at [facility] [address] [place], [place] [address]    Culture   Final    NO GROWTH 5 DAYS Performed at [facility], [place] [address], [place], [place] [address]    Report Status 02/12/2022 FINAL  Final  Blood Culture (routine x 2)     Status: None   Collection Time: 02/07/22  8:30 PM   Specimen: BLOOD LEFT ARM  Result Value Ref Range Status   Specimen Description   Final    BLOOD LEFT ARM Performed at [facility], [place] [address] [place], [place] [address]    Special Requests   Final    BOTTLES DRAWN AEROBIC AND ANAEROBIC Blood Culture adequate volume Performed at [place] [address], [place], [place] [address]    Culture   Final    NO GROWTH 5 DAYS Performed at [facility], [place] [address], [place], [place] [address]    Report Status 02/12/2022 FINAL  Final  Resp Panel by RT-PCR (Flu A&B, Covid) Anterior Nasal Swab     Status: None    Collection Time: 02/07/22  9:25 PM   Specimen: Anterior Nasal Swab  Result Value Ref Range Status   SARS Coronavirus 2 by RT PCR NEGATIVE NEGATIVE Final    Comment: (NOTE) SARS-CoV-2 target nucleic acids are NOT DETECTED.  The SARS-CoV-2 RNA is generally detectable in upper respiratory specimens during the acute phase of infection. The lowest concentration of SARS-CoV-2 viral copies this assay can detect is 138 copies/mL. A negative result does not preclude SARS-Cov-2 infection and should not be used as the sole basis for treatment or other patient management decisions. A negative result may occur with  improper specimen collection/handling, submission of specimen other than nasopharyngeal swab, presence of viral mutation(s) within the areas targeted by this assay, and inadequate number of viral copies(<138 copies/mL). A negative result must be combined with clinical observations, patient history, and epidemiological information. The expected result is Negative.  Fact Sheet for Patients:  EntrepreneurPulse.com.au  Fact Sheet for Healthcare Providers:  IncredibleEmployment.be  This test is no t yet approved or cleared by the [place] FDA and  has been authorized for detection  and/or diagnosis of SARS-CoV-2 by FDA under an Emergency Use Authorization (EUA). This EUA will remain  in effect (meaning this test can be used) for the duration of the COVID-19 declaration under Section 564(b)(1) of the Act, 21 U.S.C.section 360bbb-3(b)(1), unless the authorization is terminated  or revoked sooner.       Influenza A by PCR NEGATIVE NEGATIVE Final   Influenza B by PCR NEGATIVE NEGATIVE Final    Comment: (NOTE) The Xpert Xpress SARS-CoV-2/FLU/RSV plus assay is intended as an aid in the diagnosis of influenza from Nasopharyngeal swab specimens and should not be used as a sole basis for treatment. Nasal washings and aspirates are unacceptable for  Xpert Xpress SARS-CoV-2/FLU/RSV testing.  Fact Sheet for Patients: EntrepreneurPulse.com.au  Fact Sheet for Healthcare Providers: IncredibleEmployment.be  This test is not yet approved or cleared by the [place] FDA and has been authorized for detection and/or diagnosis of SARS-CoV-2 by FDA under an Emergency Use Authorization (EUA). This EUA will remain in effect (meaning this test can be used) for the duration of the COVID-19 declaration under Section 564(b)(1) of the Act, 21 U.S.C. section 360bbb-3(b)(1), unless the authorization is terminated or revoked.  Performed at [facility], [place] [address] [place], [place] [address]   MRSA Next Gen by PCR, Nasal     Status: None   Collection Time: 02/08/22  4:41 AM   Specimen: Nasal Mucosa; Nasal Swab  Result Value Ref Range Status   MRSA by PCR Next Gen NOT DETECTED NOT DETECTED Final    Comment: (NOTE) The GeneXpert MRSA Assay (FDA approved for NASAL specimens only), is one component of a comprehensive MRSA colonization surveillance program. It is not intended to diagnose MRSA infection nor to guide or monitor treatment for MRSA infections. Test performance is not FDA approved in patients less than 32 years old. Performed at [facility], [place] [address] [place], [place] [address]   Urine Culture     Status: None   Collection Time: 02/09/22 12:56 PM   Specimen: Urine, Clean Catch  Result Value Ref Range Status   Specimen Description   Final    URINE, CLEAN CATCH Performed at [facility], [place] [address] [place], [place] [address]    Special Requests   Final    NONE Performed at [facility], [place] [address] [place], [place] [address]    Culture   Final    NO GROWTH Performed at [facility], [place] [address] [place], [place] [address]    Report Status 02/10/2022 FINAL  Final  Culture, blood (Routine  X 2) w Reflex to ID Panel     Status: None (Preliminary result)   Collection Time: 02/11/22  6:33 AM   Specimen: BLOOD  Result Value Ref Range Status   Specimen Description   Final    BLOOD SITE NOT SPECIFIED Performed at [place] [facility] [place] [address] [place], [place] [address]    Special Requests   Final    IN PEDIATRIC BOTTLE Blood Culture results may not be optimal due to an inadequate volume of blood received in culture bottles Performed at [facility] [address] [place], [place] [address]    Culture   Final    NO GROWTH < 24 HOURS Performed at [place] [address], [place], [place] [address]    Report Status PENDING  Incomplete  Culture, blood (Routine X 2) w Reflex to ID  Panel     Status: None (Preliminary result)   Collection Time: 02/11/22  6:33 AM   Specimen: BLOOD  Result Value Ref Range Status   Specimen Description   Final    BLOOD SITE NOT SPECIFIED Performed at [facility] [address], [place], [place] [address]    Special Requests   Final    IN PEDIATRIC BOTTLE Blood Culture results may not be optimal due to an inadequate volume of blood received in culture bottles Performed at [facility] [address], [place], [place] [address]    Culture   Final    NO GROWTH < 24 HOURS Performed at [facility] [address] [place], [place] [address]    Report Status PENDING  Incomplete         Radiology Studies: DG CHEST PORT 1 VIEW  Result Date: 02/11/2022 CLINICAL DATA:  Fever. EXAM: PORTABLE CHEST 1 VIEW COMPARISON:  AP chest 02/07/2022, CT chest 02/07/2022 FINDINGS: An enteric tube descends below the diaphragm with the tip excluded by collimation, new from prior 02/07/2022 radiograph. Cardiac silhouette is again moderately enlarged. Mediastinal contours are within normal limits. Moderately decreased lung volumes. Lateral left lower lung horizontal  linear subsegmental atelectasis versus scarring, similar to prior radiograph and CT. No pneumothorax. No definite pleural effusion. No acute skeletal abnormality. IMPRESSION: 1. Moderately decreased lung volumes with unchanged left basilar subsegmental atelectasis. 2. New enteric tube descends below the diaphragm. Electronically Signed   By: Yvonne Kendall M.D.   On: 02/11/2022 09:48   DG Abd 1 View  Result Date: 02/10/2022 CLINICAL DATA:  Check gastric catheter placement EXAM: ABDOMEN - 1 VIEW COMPARISON:  None Available. FINDINGS: Gastric catheter is noted extending into the stomach. Scattered large and small bowel gas is noted. No free air is seen. IMPRESSION: Gastric catheter within the stomach. Electronically Signed   By: Inez Catalina M.D.   On: 02/10/2022 19:49        Scheduled Meds:  chlorhexidine  15 mL Mouth/Throat Once   Chlorhexidine Gluconate Cloth  6 each Topical Daily   insulin aspart  0-20 Units Subcutaneous Q4H   Continuous Infusions:  lactated ringers 100 mL/hr at 02/12/22 0837   piperacillin-tazobactam (ZOSYN)  IV Stopped (02/12/22 0803)   potassium chloride 10 mEq (02/12/22 0846)     LOS: 4 days    Time spent:40 min    Siddhi Dornbush, Geraldo Docker, MD [facility]   If 7PM-7AM, please contact night-coverage 02/12/2022, 9:19 AM

## 2022-02-12 NOTE — Progress Notes (Signed)
5 Days Post-Op Ex lap, L colectomy and colostomy Subjective:  Patient states he overall feels better today. He and his wife report increased gas in his colostomy bag. He says he has been compliant with limited ice chips but continues to have high NG output. He reports a low grade temp of 100 this AM.    Objective: Vital signs in last 24 hours: Temp:  [97.8 F (36.6 C)-101.4 F (38.6 C)] 100.3 F (37.9 C) (11/28 0800) Pulse Rate:  [93-123] 99 (11/28 1000) Resp:  [13-25] 15 (11/28 1000) BP: (92-151)/(41-79) 113/59 (11/28 1000) SpO2:  [92 %-98 %] 94 % (11/28 1000)   Intake/Output from previous day: 11/27 0701 - 11/28 0700 In: 2376 [I.V.:1575.1; IV Piggyback:800.9] Out: 2650 [Urine:500; Emesis/NG output:2150] Intake/Output this shift: Total I/O In: 750.8 [I.V.:524.9; IV Piggyback:225.9] Out: 1775 [Urine:400; Emesis/NG output:1150; Stool:225]   Gen: alert, NAD  CV: HR !02 bpm  Pulm: normal effort on 5L [place] Abd: soft, mild to omderate distention, midline dressing c/d/I, End colostomy with viable, edematous stoma, some light brown stool in pouch and gas. Appropriately tender without peritonitis  NG- >2,200 cc/ 24h  Ostomy -225 cc documented in epic last 24h  Lab Results:  Recent Labs    02/11/22 0249 02/12/22 0300  WBC 10.9* 8.8  HGB 9.8* 9.0*  HCT 29.9* 27.6*  PLT 276 251   BMET Recent Labs    02/11/22 0249 02/12/22 0300  NA 143 141  K 2.9* 2.6*  CL 91* 87*  CO2 42* 42*  GLUCOSE 86 115*  BUN 15 16  CREATININE 0.60* 0.67  CALCIUM 7.9* 7.4*   PT/INR No results for input(s): "LABPROT", "INR" in the last 72 hours. ABG No results for input(s): "PHART", "HCO3" in the last 72 hours.  Invalid input(s): "PCO2", "PO2"  MEDS, Scheduled  chlorhexidine  15 mL Mouth/Throat Once   Chlorhexidine Gluconate Cloth  6 each Topical Daily   insulin aspart  0-20 Units Subcutaneous Q4H    Studies/Results: DG CHEST PORT 1 VIEW  Result Date: 02/11/2022 CLINICAL DATA:   Fever. EXAM: PORTABLE CHEST 1 VIEW COMPARISON:  AP chest 02/07/2022, CT chest 02/07/2022 FINDINGS: An enteric tube descends below the diaphragm with the tip excluded by collimation, new from prior 02/07/2022 radiograph. Cardiac silhouette is again moderately enlarged. Mediastinal contours are within normal limits. Moderately decreased lung volumes. Lateral left lower lung horizontal linear subsegmental atelectasis versus scarring, similar to prior radiograph and CT. No pneumothorax. No definite pleural effusion. No acute skeletal abnormality. IMPRESSION: 1. Moderately decreased lung volumes with unchanged left basilar subsegmental atelectasis. 2. New enteric tube descends below the diaphragm. Electronically Signed   By: Yvonne Kendall M.D.   On: 02/11/2022 09:48   DG Abd 1 View  Result Date: 02/10/2022 CLINICAL DATA:  Check gastric catheter placement EXAM: ABDOMEN - 1 VIEW COMPARISON:  None Available. FINDINGS: Gastric catheter is noted extending into the stomach. Scattered large and small bowel gas is noted. No free air is seen. IMPRESSION: Gastric catheter within the stomach. Electronically Signed   By: Inez Catalina M.D.   On: 02/10/2022 19:49    Assessment: s/p Procedure(s): EXPLORATORY LAPAROTOMY, COLON RESECTION, OSTOMY Patient Active Problem List   Diagnosis Date Noted   Perforated abdominal viscus 02/08/2022   IFG (impaired fasting glucose) 09/27/2015   Borderline hypertension 09/27/2015   Lateral epicondylitis of left elbow 01/27/2015   Fracture of toe, closed 05/18/2014   Seborrheic dermatitis 02/28/2014   Subacromial bursitis 11/09/2013   Bronchopneumonia 11/03/2013   Cerumen  impaction 11/03/2013   Posterior interosseous nerve syndrome 10/25/2013   Hearing impairment 10/04/2013   Right shoulder pain 10/04/2013   Health maintenance examination 02/26/2013   Hyperlipidemia 06/15/2011   SEVERE OBSTRUCTIVE SLEEP APNEA with AHI 94 03/30/2010   Morbid obesity ([place]) 02/06/2010   Adult  ADHD 03/07/2009   ALLERGIC RHINITIS 03/06/2009   DERMATITIS 03/06/2009    Expected post op course  Plan: Perforated sigmoid colon S/p EXPLORATORY LAPAROTOMY, MOBILIZATION OF SPLENIC FLEXURE, DESCENDING AND SIGMOID COLON RESECTION, TRANSVERSE COLOSTOMY 02/08/22 Dr. [name] - POD#4 - await path - fever curve and HR are improved today. WBC 8.8 from 10.9 - patient with increasing bowel function but also with high output NG. Ok to attempt clamp trial today. Plan to resume LIWS at 1600, or sooner if he develops nausea.  - for ongoing ileus, or if he has signs of increasing infection (fever, tachycardia, increasing abd pain) he will need a repeat CT scan with IV contrast. I do not seen an urgent need to get this today. Would try to wait at least another 24-48h.  - Cont IV Abx x 5 days from surgery, tentatively  - PT/OT  IBD: awaiting path report, GI on board and following.  Hold on steroids for now to allow for post surgical healing  FEN: NPO, IVF, NG clamp trial, ok for limited ICE; hypokalemia (2.6) - 6 runs KCl ordered, will likely need more. Mg is 2.0 ID: Zosyn 11/24 >> ; febrile, tachycardic, WBC 10.9; check CXR today VTE: SCDs, recommend DVT ppx with SQH or lovenox Foley: removed yesterday Dispo: ICU    LOS: 4 days     Richard [name], [facility], [place]    02/12/2022 11:17 AM

## 2022-02-12 NOTE — Evaluation (Addendum)
Occupational Therapy Evaluation Patient Details Name: BRANNON LEVENE MRN: [id number] DOB: 1956/01/26 Today's Date: 02/12/2022   History of Present Illness Patient is a 66 year old male who was admitted on 11/23 with bowel preforation. patient underwent exploratogy lap, sigmoid resection and transverse colostomy. PMH: IBD, crohn's colitis,COVID July 2022, HLD, HTN, OSA, PNA, Seborrheic dermatitis, Hypogonadism, DM type 2, and peripheral neuopathy.   Clinical Impression   Patient is a 66 year old male who was admitted for above. Patient was living at home independently with wife prior level. Patient was noted to have increased pain impacting participation in session today. Patient is motivated to participate in therapy even with fatigue and pain in place. Patient was TD for LB ADLs and max A to don abdominal binder. Patients HR was noted to be 110s sitting EOB. Patient stated goal was to be able to go outside with wife soon. Patient would continue to benefit from skilled OT services at this time while admitted and after d/c to address noted deficits in order to improve overall safety and independence in ADLs.        Recommendations for follow up therapy are one component of a multi-disciplinary discharge planning process, led by the attending physician.  Recommendations may be updated based on patient status, additional functional criteria and insurance authorization.   Follow Up Recommendations  Skilled nursing-short term rehab (<3 hours/day)     Assistance Recommended at Discharge Frequent or constant Supervision/Assistance  Patient can return home with the following Two people to help with walking and/or transfers;A lot of help with bathing/dressing/bathroom;Assistance with cooking/housework;Direct supervision/assist for medications management;Assist for transportation;Direct supervision/assist for financial management;Help with stairs or ramp for entrance    Functional Status Assessment   Patient has had a recent decline in their functional status and demonstrates the ability to make significant improvements in function in a reasonable and predictable amount of time.  Equipment Recommendations  Other (comment) (RW)    Recommendations for Other Services       Precautions / Restrictions Precautions Precaution Comments: NG suction, L colostomy,needs O2, Required Braces or Orthoses: Other Brace Other Brace: abdominal binder Restrictions Weight Bearing Restrictions: No      Mobility Bed Mobility Overal bed mobility: Needs Assistance Bed Mobility: Rolling, Sidelying to Sit Rolling: Mod assist Sidelying to sit: Mod assist       General bed mobility comments: requried education on log rolling to sequence transition to EOB. needed increased physical A to complete task.    Transfers                          Balance Overall balance assessment: Needs assistance Sitting-balance support: Bilateral upper extremity supported, Feet supported Sitting balance-Leahy Scale: Fair         Standing balance comment: unable                           ADL either performed or assessed with clinical judgement   ADL Overall ADL's : Needs assistance/impaired   Eating/Feeding Details (indicate cue type and reason): can only have some ice chips at this time. Grooming: Set up;Sitting   Upper Body Bathing: Moderate assistance Upper Body Bathing Details (indicate cue type and reason): too many lines and leads to assess fully at this time Lower Body Bathing: Total assistance;Bed level   Upper Body Dressing : Maximal assistance;Sitting   Lower Body Dressing: Bed level;Total assistance   Toilet Transfer: +  2 for safety/equipment;+2 for physical assistance Toilet Transfer Details (indicate cue type and reason): patient attempted sitting EOB with noted cramps in BLE with repositioning on edge of bed. returned to supine with +2 Toileting- Clothing Manipulation and  Hygiene: Total assistance;Bed level       Functional mobility during ADLs: +2 for physical assistance;+2 for safety/equipment       Vision Patient Visual Report: No change from baseline       Perception     Praxis      Pertinent Vitals/Pain Pain Assessment Pain Assessment: 0-10 Faces Pain Scale: Hurts little more Pain Location: abdomen when rolling and sitting up Pain Descriptors / Indicators: Grimacing, Guarding, Discomfort Pain Intervention(s): Limited activity within patient's tolerance, Monitored during session, Repositioned     Hand Dominance Right   Extremity/Trunk Assessment Upper Extremity Assessment Upper Extremity Assessment: Overall WFL for tasks assessed   Lower Extremity Assessment Lower Extremity Assessment: Defer to PT evaluation   Cervical / Trunk Assessment Cervical / Trunk Assessment: Other exceptions Cervical / Trunk Exceptions: abdominal surgery and new colostomy   Communication Communication Communication: HOH   Cognition Arousal/Alertness: Awake/alert Behavior During Therapy: WFL for tasks assessed/performed Overall Cognitive Status: Within Functional Limits for tasks assessed                                 General Comments: wife is present in room as well.     General Comments       Exercises     Shoulder Instructions      Home Living Family/patient expects to be discharged to:: Private residence Living Arrangements: Spouse/significant other Available Help at Discharge: Family Type of Home: House Home Access: Stairs to enter Technical brewer of Steps: 5 Entrance Stairs-Rails: Right;Left Home Layout: Two level Alternate Level Stairs-Number of Steps: 15 down to his  office Alternate Level Stairs-Rails: Left Bathroom Shower/Tub: Walk-in shower         Home Equipment: None          Prior Functioning/Environment Prior Level of Function : Independent/Modified Independent             Mobility  Comments: works from home ,          OT Problem List: Decreased activity tolerance;Decreased safety awareness;Decreased knowledge of precautions;Pain;Cardiopulmonary status limiting activity;Decreased knowledge of use of DME or AE;Decreased strength;Impaired balance (sitting and/or standing);Increased edema      OT Treatment/Interventions: Self-care/ADL training;Therapeutic exercise;Energy conservation;DME and/or AE instruction;Neuromuscular education;Therapeutic activities;Patient/family education;Balance training    OT Goals(Current goals can be found in the care plan section) Acute Rehab OT Goals Patient Stated Goal: to get better OT Goal Formulation: With patient/family Time For Goal Achievement: 02/26/22 Potential to Achieve Goals: Fair ADL Goals Pt Will Perform Grooming: with supervision;sitting Pt Will Perform Lower Body Dressing: with min assist;with adaptive equipment;sit to/from stand;sitting/lateral leans Pt Will Transfer to Toilet: with supervision;ambulating;regular height toilet  OT Frequency: Min 2X/week    Co-evaluation              AM-PAC OT "6 Clicks" Daily Activity     Outcome Measure Help from another person eating meals?: Total Help from another person taking care of personal grooming?: A Little Help from another person toileting, which includes using toliet, bedpan, or urinal?: Total Help from another person bathing (including washing, rinsing, drying)?: Total Help from another person to put on and taking off regular upper body clothing?: A Lot Help from  another person to put on and taking off regular lower body clothing?: Total 6 Click Score: 9   End of Session Equipment Utilized During Treatment: Rolling walker (2 wheels);Other (comment) (abdominal binder) Nurse Communication: Mobility status;Other (comment) (nurse ok for patient to participate in session and  in room at end of session to assit with repositioning)  Activity Tolerance: Patient  tolerated treatment well Patient left: in bed;with call bell/phone within reach;with family/visitor present;with nursing/sitter in room  OT Visit Diagnosis: Unsteadiness on feet (R26.81);Other abnormalities of gait and mobility (R26.89);Pain                Time: 1530-1603 OT Time Calculation (min): 33 min Charges:  OT General Charges $OT Visit: 1 Visit OT Evaluation $OT Eval Moderate Complexity: 1 Mod OT Treatments $Self Care/Home Management : 8-22 mins  Rennie Plowman, MS Acute Rehabilitation Department Office# [phone]   Willa Rough 02/12/2022, 4:37 PM

## 2022-02-12 NOTE — Progress Notes (Addendum)
Patient ID: Richard [name], male   DOB: 1955-11-26, 66 y.o.   MRN: [id number]    Progress Note   Subjective   Day # 5 CC; new diagnosis of IBD, colonic perforation status post exploratory lap, partial colectomy and transverse colostomy 02/08/2022 IV Zosyn  Surgical path pending Colonoscopic biopsies pending  WBC 8.8/hemoglobin 9.0/hematocrit 27.6 Potassium 2.6/BUN 16/creatinine 0.67/albumin less than 1.5 Blood cultures 02/11/2022 no growth thus far QuantiFERON gold indeterminant-AFB smear and culture in progress  Chest x-ray 02/11/2022 moderately decreased lung volumes unchanged left basilar atelectasis  Tmax 100.3 NG output 750 cc past shift-clamped currently  He is having ostomy output but denies any significant abdominal pain today very thirsty and wants fluids.    Objective   Vital signs in last 24 hours: Temp:  [97.8 F (36.6 C)-101.4 F (38.6 C)] 100.3 F (37.9 C) (11/28 0800) Pulse Rate:  [93-123] 106 (11/28 0800) Resp:  [13-25] 16 (11/28 0800) BP: (92-151)/(41-79) 108/63 (11/28 0800) SpO2:  [92 %-98 %] 95 % (11/28 0800) Last BM Date : 02/11/22 General:    Older white male in NAD, NG in place with bilious effluent Heart:  Regular rate and rhythm; no murmurs Lungs: Respirations even and unlabored, lungs CTA bilaterally Abdomen: Protuberant, few bowel sounds present, diffusely tender though no guarding or rebound at present , ostomy with brown liquid stool Extremities:  Without edema. Neurologic:  Alert and oriented,  grossly normal neurologically. Psych:  Cooperative. Normal mood and affect.  Intake/Output from previous day: 11/27 0701 - 11/28 0700 In: 2376 [I.V.:1575.1; IV Piggyback:800.9] Out: 2650 [Urine:500; Emesis/NG output:2150] Intake/Output this shift: Total I/O In: 287.5 [I.V.:261.6; IV Piggyback:25.8] Out: 1150 [Urine:400; Emesis/NG output:750]  Lab Results: Recent Labs    02/10/22 0252 02/11/22 0249 02/12/22 0300  WBC 8.9 10.9* 8.8  HGB  9.8* 9.8* 9.0*  HCT 28.5* 29.9* 27.6*  PLT 282 276 251   BMET Recent Labs    02/10/22 0252 02/11/22 0249 02/12/22 0300  NA 143 143 141  K 3.1* 2.9* 2.6*  CL 95* 91* 87*  CO2 40* 42* 42*  GLUCOSE 104* 86 115*  BUN '22 15 16  '$ CREATININE 0.60* 0.60* 0.67  CALCIUM 7.8* 7.9* 7.4*   LFT Recent Labs    02/12/22 0300  PROT 5.2*  ALBUMIN <1.5*  AST 18  ALT 14  ALKPHOS 64  BILITOT 0.9   PT/INR No results for input(s): "LABPROT", "INR" in the last 72 hours.  Studies/Results: DG CHEST PORT 1 VIEW  Result Date: 02/11/2022 CLINICAL DATA:  Fever. EXAM: PORTABLE CHEST 1 VIEW COMPARISON:  AP chest 02/07/2022, CT chest 02/07/2022 FINDINGS: An enteric tube descends below the diaphragm with the tip excluded by collimation, new from prior 02/07/2022 radiograph. Cardiac silhouette is again moderately enlarged. Mediastinal contours are within normal limits. Moderately decreased lung volumes. Lateral left lower lung horizontal linear subsegmental atelectasis versus scarring, similar to prior radiograph and CT. No pneumothorax. No definite pleural effusion. No acute skeletal abnormality. IMPRESSION: 1. Moderately decreased lung volumes with unchanged left basilar subsegmental atelectasis. 2. New enteric tube descends below the diaphragm. Electronically Signed   By: Yvonne Kendall M.D.   On: 02/11/2022 09:48   DG Abd 1 View  Result Date: 02/10/2022 CLINICAL DATA:  Check gastric catheter placement EXAM: ABDOMEN - 1 VIEW COMPARISON:  None Available. FINDINGS: Gastric catheter is noted extending into the stomach. Scattered large and small bowel gas is noted. No free air is seen. IMPRESSION: Gastric catheter within the stomach. Electronically Signed  By: Inez Catalina M.D.   On: 02/10/2022 19:49       Assessment / Plan:    #70 66 year old white male with new diagnosis of IBD, suspect Crohn's but surgical path and colonoscopic biopsies still pending Acute bowel perforation/sigmoid colon  02/08/2022-descending and sigmoid resection with transverse colostomy Significant purulent ascites at time of surgery  Patient has had intermittent low-grade fevers, WBC currently normal On IV Zosyn   If Has further temp spike, will need CT abdomen and pelvis  Await path Eventually will need biologic Rx probable Remicade but this cannot be entertained until he has recovered postoperatively, and we are assured he has not developed any abscesses.  Expect will be at least 6 weeks out  Also note QuantiFERON gold was indeterminant and is undergoing further TB testing while inpatient.  #2-malnutrition-albumin quite low and patient has not had any significant p.o. intake in 7 days-if unable to get NG out and start liquids today then need to start parenteral nutrition  #3 hypokalemia-being replaced IV   GI will continue to follow peripherally during hospitalization      Principal Problem:   Perforated abdominal viscus     LOS: 4 days   Amy Esterwood PA-C 02/12/2022, 8:56 AM     Attending physician's note   I have taken history, reviewed the chart and examined the patient. I performed a substantive portion of this encounter, including complete performance of at least one of the key components, in conjunction with the APP. I agree with the Advanced Practitioner's note, impression and recommendations.   Continue supportive care OK to have ice chips. Maintain accurate NG output Hopefully clears in 24 hrs, otherwise TPN for nurtition Correct lytes. Keep K>4, Mg>2 Rpt imaging per Surgery. Follow final path.   Carmell Austria, MD [facility] [phone]

## 2022-02-12 NOTE — Progress Notes (Signed)
Pharmacy consulted to dose TPN  Consult submitted after compounding cutoff time for today; will start tomorrow. TPN labs and ancillary orders entered.  Reuel Boom, PharmD, BCPS [phone] 02/12/2022, 6:21 PM

## 2022-02-12 NOTE — Progress Notes (Signed)
Pt still with Nasogastric tube in and unable to tolerate CPAP.

## 2022-02-13 ENCOUNTER — Inpatient Hospital Stay: Payer: Self-pay

## 2022-02-13 DIAGNOSIS — R198 Other specified symptoms and signs involving the digestive system and abdomen: Secondary | ICD-10-CM | POA: Diagnosis not present

## 2022-02-13 DIAGNOSIS — E43 Unspecified severe protein-calorie malnutrition: Secondary | ICD-10-CM | POA: Diagnosis not present

## 2022-02-13 LAB — COMPREHENSIVE METABOLIC PANEL
ALT: 14 U/L (ref 0–44)
AST: 26 U/L (ref 15–41)
Albumin: 1.7 g/dL — ABNORMAL LOW (ref 3.5–5.0)
Alkaline Phosphatase: 73 U/L (ref 38–126)
Anion gap: 14 (ref 5–15)
BUN: 14 mg/dL (ref 8–23)
CO2: 43 mmol/L — ABNORMAL HIGH (ref 22–32)
Calcium: 7.7 mg/dL — ABNORMAL LOW (ref 8.9–10.3)
Chloride: 86 mmol/L — ABNORMAL LOW (ref 98–111)
Creatinine, Ser: 0.65 mg/dL (ref 0.61–1.24)
GFR, Estimated: >60 mL/min (ref >60)
Glucose, Bld: 140 mg/dL — ABNORMAL HIGH (ref 70–99)
Potassium: 2.9 mmol/L — ABNORMAL LOW (ref 3.5–5.1)
Sodium: 143 mmol/L (ref 135–145)
Total Bilirubin: 1 mg/dL (ref 0.3–1.2)
Total Protein: 6 g/dL — ABNORMAL LOW (ref 6.5–8.1)

## 2022-02-13 LAB — CBC WITH DIFFERENTIAL/PLATELET
Abs Immature Granulocytes: 0.04 10*3/uL (ref 0.00–0.07)
Basophils Absolute: 0 10*3/uL (ref 0.0–0.1)
Basophils Relative: 0 %
Eosinophils Absolute: 0 10*3/uL (ref 0.0–0.5)
Eosinophils Relative: 1 %
HCT: 32.1 % — ABNORMAL LOW (ref 39.0–52.0)
Hemoglobin: 10.2 g/dL — ABNORMAL LOW (ref 13.0–17.0)
Immature Granulocytes: 1 %
Lymphocytes Relative: 4 %
Lymphs Abs: 0.3 10*3/uL — ABNORMAL LOW (ref 0.7–4.0)
MCH: 33.4 pg (ref 26.0–34.0)
MCHC: 31.8 g/dL (ref 30.0–36.0)
MCV: 105.2 fL — ABNORMAL HIGH (ref 80.0–100.0)
Monocytes Absolute: 0.1 10*3/uL (ref 0.1–1.0)
Monocytes Relative: 2 %
Neutro Abs: 7.2 10*3/uL (ref 1.7–7.7)
Neutrophils Relative %: 92 %
Platelets: 281 10*3/uL (ref 150–400)
RBC: 3.05 MIL/uL — ABNORMAL LOW (ref 4.22–5.81)
RDW: 13.7 % (ref 11.5–15.5)
WBC: 7.8 10*3/uL (ref 4.0–10.5)
nRBC: 0 % (ref 0.0–0.2)

## 2022-02-13 LAB — ACID FAST SMEAR (AFB, MYCOBACTERIA): Acid Fast Smear: NEGATIVE

## 2022-02-13 LAB — GLUCOSE, CAPILLARY
Glucose-Capillary: 113 mg/dL — ABNORMAL HIGH (ref 70–99)
Glucose-Capillary: 139 mg/dL — ABNORMAL HIGH (ref 70–99)
Glucose-Capillary: 132 mg/dL — ABNORMAL HIGH (ref 70–99)
Glucose-Capillary: 116 mg/dL — ABNORMAL HIGH (ref 70–99)
Glucose-Capillary: 147 mg/dL — ABNORMAL HIGH (ref 70–99)
Glucose-Capillary: 151 mg/dL — ABNORMAL HIGH (ref 70–99)

## 2022-02-13 LAB — HEPATITIS B SURFACE ANTIBODY,QUALITATIVE: Hep B S Ab: NONREACTIVE

## 2022-02-13 LAB — PHOSPHORUS: Phosphorus: 2.7 mg/dL (ref 2.5–4.6)

## 2022-02-13 LAB — THIOPURINE METHYLTRANSFERASE (TPMT), RBC: Thiopurine Methyltransferase, RBC: 7 nmol/hr/mL RBC — ABNORMAL LOW

## 2022-02-13 LAB — HEPATITIS B SURFACE ANTIGEN: Hepatitis B Surface Ag: NONREACTIVE

## 2022-02-13 LAB — MAGNESIUM: Magnesium: 1.9 mg/dL (ref 1.7–2.4)

## 2022-02-13 LAB — TRIGLYCERIDES: Triglycerides: 143 mg/dL (ref <150)

## 2022-02-13 MED ORDER — POTASSIUM CHLORIDE 10 MEQ/100ML IV SOLN
10.0000 meq | INTRAVENOUS | Status: DC
  Administered 2022-02-13: 10 meq via INTRAVENOUS
  Filled 2022-02-13: qty 100

## 2022-02-13 MED ORDER — SODIUM CHLORIDE 0.9 % IV SOLN
12.5000 mg | Freq: Four times a day (QID) | INTRAVENOUS | Status: DC | PRN
  Administered 2022-02-13 – 2022-02-14 (×2): 12.5 mg via INTRAVENOUS
  Filled 2022-02-13 (×2): qty 0.5

## 2022-02-13 MED ORDER — MAGNESIUM SULFATE 2 GM/50ML IV SOLN
2.0000 g | Freq: Once | INTRAVENOUS | Status: AC
  Administered 2022-02-13: 2 g via INTRAVENOUS
  Filled 2022-02-13: qty 50

## 2022-02-13 MED ORDER — THIAMINE HCL 100 MG/ML IJ SOLN
100.0000 mg | Freq: Once | INTRAMUSCULAR | Status: AC
  Administered 2022-02-13: 100 mg via INTRAVENOUS
  Filled 2022-02-13: qty 2

## 2022-02-13 MED ORDER — POTASSIUM CHLORIDE 10 MEQ/100ML IV SOLN
10.0000 meq | INTRAVENOUS | Status: DC
  Administered 2022-02-13 (×4): 10 meq via INTRAVENOUS
  Filled 2022-02-13 (×4): qty 100

## 2022-02-13 MED ORDER — SODIUM CHLORIDE 0.9% FLUSH
10.0000 mL | Freq: Two times a day (BID) | INTRAVENOUS | Status: DC
  Administered 2022-02-13 – 2022-02-25 (×21): 10 mL
  Administered 2022-02-25: 20 mL
  Administered 2022-02-26 – 2022-02-27 (×4): 10 mL
  Administered 2022-02-28: 20 mL
  Administered 2022-02-28: 30 mL
  Administered 2022-03-01: 10 mL

## 2022-02-13 MED ORDER — POTASSIUM CHLORIDE 10 MEQ/100ML IV SOLN
10.0000 meq | INTRAVENOUS | Status: AC
  Administered 2022-02-13 (×3): 10 meq via INTRAVENOUS
  Filled 2022-02-13 (×3): qty 100

## 2022-02-13 MED ORDER — LACTATED RINGERS IV SOLN: INTRAVENOUS | Status: DC

## 2022-02-13 MED ORDER — TRAVASOL 10 % IV SOLN
INTRAVENOUS | Status: AC
  Filled 2022-02-13: qty 576

## 2022-02-13 MED ORDER — SODIUM CHLORIDE 0.9% FLUSH
10.0000 mL | INTRAVENOUS | Status: DC | PRN
  Administered 2022-02-16: 10 mL

## 2022-02-13 NOTE — Progress Notes (Addendum)
PHARMACY - TOTAL PARENTERAL NUTRITION CONSULT NOTE   Indication: Prolonged ileus  Patient Measurements: Height: '5\' 9"'$  (175.3 cm) Weight: 104.5 kg (230 lb 6.1 oz) IBW/kg (Calculated) : 70.7 TPN AdjBW (KG): 80.3 Body mass index is 34.02 kg/m. Usual Weight:   Assessment: 66 yo M s/p colonic perforation status post exploratory lap, partial colectomy and transverse colostomy 02/08/2022  Pharmacy consulted to manage TPN for prolonged ileus.  Failed NGT clamping trial 11/28.   PMHx Scrotal abscess August 2023, Colon polyp, Diverticulosis, ADHD, Anxiety, Rhinitis, COVID July 2022, HLD, HTN, OSA, PNA, Seborrheic dermatitis, Hypogonadism, DM type 2, Peripheral neuropathy, former smoker,    Glucose / Insulin: hx DM2 on metformin, Jardiance PTA Goal CBG < 180. CBGs all < 140 prior to TPN Q4h resistant SSI Electrolytes: K 2.9, 6 runs ordered by TRH; Phos 2.7, Mag 1.9, CoCa 9.54 Renal: SCr WNL Hepatic: LFT WNL, Trig 143 (11/29), ALb 1.7 Intake / Output;I/O neg 1748 NGO: 3000/24 hrs. Stool out 470/24 hrs. UOP 1350/24 hrs.   MIVF: LR 100 ml/hr GI Imaging:  11/28 CT A/P: post op ileus GI Surgeries / Procedures:  02/08/22:  colonic perforation status post exploratory lap, partial colectomy and transverse colostomy  Central access: PICC placed 11/28 for TPN TPN start date: 02/13/22  Nutritional Goals: Goal TPN rate is 96 mL/hr (provides 138 g of protein and 2304 kcals per day)  RD Assessment: 11/29  Kcal: 2300-2600 kcals Protein: 135-155 grams  Fluid: >/= 2.3  Current Nutrition:  NPO  Plan:  6 runs K per MD, will add 2 more for total of 8 Mag 2 gm IV x 1,  Start TPN at 40 mL/hr at 1800 to provide 47.6 g protein & 960 Kcal and assess tolerance Electrolytes in TPN: Na 82mq/L, K 532m/L, Ca 71m58mL, Mg 71mE11m, and Phos 171mm81m. Cl:Ac 1:1 Add standard MVI and trace elements to TPN continue Resistant q4h SSI and adjust as needed  Reduce MIVF to 60 mL/hr at 1800 to keep total IVF 100 ml/hr  or per MD Monitor TPN labs on Mon/Thurs  MicheEudelia [email or web address] Use secure chat for questions 02/13/2022 9:13 AM  Addendum: add thiamine per RD request

## 2022-02-13 NOTE — Progress Notes (Signed)
Pt still with NG tube in and unable to wear CPAP.

## 2022-02-13 NOTE — Progress Notes (Addendum)
Patient ID: Richard [name], male   DOB: [birth year], 66 y.o.   MRN: [id number]     Progress Note   Subjective   Day # 6 CC; new diagnosis of IBD, acute sigmoid perforation Day #6 status post exploratory lap, left colectomy and transverse colostomy  IV Zosyn TPN to start today  WBC 7.8/hemoglobin 10.2/hematocrit 32.1/platelets 281 Potassium 2.9/BUN 14/creatinine 0.65/LFTs within normal limits AFB culture in progress  Blood cultures 02/11/2022 no growth  Surgical path- Colonic mucosa with focal transmural inflammation/edema and serositis compatible with perforation margins appear viable 4 benign lymph nodes  Colon Path from 02/04/2022-all of the colonic specimen showed chronic active colitis, mild changes of chronicity, no granulomas, no dysplasia-consistent with nonspecific IBD  CT abdomen and pelvis without contrast yesterday showed status post left colectomy with transverse colostomy and Hartmann pouch scattered free fluid in the abdomen and pelvis and small amount of free air and free fluid in the hepatorenal fossa no discrete drainable abscess, multiple mildly dilated small bowel loops and scattered air-fluid level fulls consistent with postop ileus, small bilateral effusions/moderate atelectasis  Patient is comfortable continues to ask for liquids, able to have some ice chips, intermittent hiccups. Continues to have high NG output Colostomy functioning Tmax 100.9 last p.m.     Objective   Vital signs in last 24 hours: Temp:  [98.6 F (37 C)-100.9 F (38.3 C)] 100 F (37.8 C) (11/29 0800) Pulse Rate:  [92-115] 110 (11/29 0900) Resp:  [13-26] 19 (11/29 0900) BP: (108-146)/(55-111) 146/71 (11/29 0800) SpO2:  [94 %-99 %] 94 % (11/29 0900) Weight:  [104.5 kg-105 kg] 104.5 kg (11/29 0436) Last BM Date : 02/13/22 General:    Older white male in NAD wife at bedside Heart: tachy Regular rate and rhythm; no murmurs Lungs: Respirations even and unlabored, lungs CTA  bilaterally Abdomen: Not reexamined this morning Extremities:  Without edema. Neurologic:  Alert and oriented,  grossly normal neurologically. Psych:  Cooperative. Normal mood and affect.  Intake/Output from previous day: 11/28 0701 - 11/29 0700 In: 3071.8 [P.O.:230; I.V.:1956.3; NG/GT:40; IV Piggyback:845.5] Out: 0034 [Urine:1350; Emesis/NG output:3000; Stool:470] Intake/Output this shift: Total I/O In: 822.2 [P.O.:50; I.V.:416.7; IV Piggyback:355.5] Out: 875 [Urine:175; Emesis/NG output:700]  Lab Results: Recent Labs    02/11/22 0249 02/12/22 0300 02/13/22 0303  WBC 10.9* 8.8 7.8  HGB 9.8* 9.0* 10.2*  HCT 29.9* 27.6* 32.1*  PLT 276 251 281   BMET Recent Labs    02/11/22 0249 02/12/22 0300 02/13/22 0303  NA 143 141 143  K 2.9* 2.6* 2.9*  CL 91* 87* 86*  CO2 42* 42* 43*  GLUCOSE 86 115* 140*  BUN '15 16 14  '$ CREATININE 0.60* 0.67 0.65  CALCIUM 7.9* 7.4* 7.7*   LFT Recent Labs    02/13/22 0303  PROT 6.0*  ALBUMIN 1.7*  AST 26  ALT 14  ALKPHOS 73  BILITOT 1.0   PT/INR No results for input(s): "LABPROT", "INR" in the last 72 hours.  Studies/Results: [place] EKG SITE RITE  Result Date: 02/12/2022 If Site Rite image not attached, placement could not be confirmed due to current cardiac rhythm.  CT ABDOMEN PELVIS WO CONTRAST  Result Date: 02/12/2022 CLINICAL DATA:  Five days postop exploratory laparotomy and left colectomy and colostomy. Rising white blood cell count. EXAM: CT ABDOMEN AND PELVIS WITHOUT CONTRAST TECHNIQUE: Multidetector CT imaging of the abdomen and pelvis was performed following the standard protocol without IV contrast. RADIATION DOSE REDUCTION: This exam was performed according to the departmental dose-optimization program  which includes automated exposure control, adjustment of the mA and/or kV according to patient size and/or use of iterative reconstruction technique. COMPARISON:  CT scan 02/07/2022 FINDINGS: Lower chest: Small bilateral pleural  effusions and moderate overlying atelectasis. Hepatobiliary: No hepatic lesions or intrahepatic biliary dilatation. High attenuation material in the gallbladder is likely vicarious excretion related to the prior CT scan. No common bile duct dilatation. Pancreas: No mass, inflammation or ductal dilatation. Spleen: Normal size.  No focal lesions. Adrenals/Urinary Tract: The adrenal glands and kidneys are unremarkable. The bladder is unremarkable. Stomach/Bowel: The stomach contains an NG tube. The duodenum is unremarkable. There are mildly dilated small bowel loops with scattered air-fluid levels, likely postop ileus. Patient has had a left colectomy. There is a left transverse colon colostomy and a Hartmann's pouch. Vascular/Lymphatic: The aorta is normal in caliber. Small scattered mesenteric and retroperitoneal lymph nodes. Reproductive: The prostate gland and seminal vesicles are unremarkable. Other: Scattered free fluid is noted in the abdomen and pelvis. There is also a small amount of free air and free fluid in the hepatic renal fossa but I do not see a discrete drainable abscess. I do not see a discrete Musculoskeletal: No significant bony findings. IMPRESSION: 1. Status post left colectomy with a left transverse colon colostomy and a Hartmann's pouch. 2. Scattered free fluid in the abdomen and pelvis and a small amount of free air and free fluid in the hepatorenal fossa but I do not see a discrete drainable abscess. 3. Mildly dilated small bowel loops with scattered air-fluid levels, likely postop ileus. 4. Small bilateral pleural effusions and moderate overlying atelectasis. Electronically Signed   By: Marijo Sanes M.D.   On: 02/12/2022 15:34       Assessment / Plan:    #73 66 year old white male, 6 days status post exploratory lap, left colectomy and transverse colostomy for acute bowel perforation/sigmoid colon setting of brand-new diagnosis of IBD at time of colonoscopy 02/04/2022  Patient has had  a persistent postop small bowel ileus and has had continued high NG output slowing his progress  Continues to have intermittent fevers, temp to 100.9 last p.m. no source identified to date, though certainly still at risk for abscess development given finding of peritonitis at the time of surgery  Surgery has suggested discontinuing Zosyn today  Surgical and colonoscopy path has returned as outlined above diffuse active chronic colitis on colonoscopic biopsies, no dysplasia, no granulomas-nonspecific IBD  #2 malnutrition-TPN to start today #3  Persistent hypokalemia-has been receiving IV potassium, magnesium and phosphorus normal #4 anemia acute on chronic secondary to above, stable  Plan; GI will continue to follow every other day Postop management as directed by surgery     Principal Problem:   Perforated abdominal viscus     LOS: 5 days   Amy Esterwood PA-C 02/13/2022, 11:00 AM     Attending physician's note   I have taken history, reviewed the chart and examined the patient. I performed a substantive portion of this encounter, including complete performance of at least one of the key components, in conjunction with the APP. I agree with the Advanced Practitioner's note, impression and recommendations.   TPN for nutrition As above   Carmell Austria, MD [facility] [phone]

## 2022-02-13 NOTE — Progress Notes (Signed)
PROGRESS NOTE  Richard [name]  DOB: 04-22-1955  PCP: Richard Sou, MD ZOX:[id number]  DOA: 02/07/2022  LOS: 5 days  Hospital Day: 7  Brief narrative: Richard [name] is a 66 y.o. male with PMH significant for DM2, HTN, HLD, obesity, OSA, hypogonadism, peripheral neuropathy.  11/7, seen in the office by Dr. Bryan [name] with complaint of diarrhea for about 3 to 4 months, 6-8 episodes a day, mixed with blood associated with abdominal cramping and progressive weakness.  Stool studies at that time were negative for infectious etiology.  Sed rate was elevated 72, fecal calprotectin was elevated to 3570. 11/20, underwent colonoscopy, found to have diffuse colitis with deep ulcerations throughout the entire colon friable mucosa as well as extensive mucosal edema in the rectum.  He was started on prednisone 40 mg daily. After colonoscopy, patient developed worsening abdominal pain. 11/23, at home patient had 1 episode of syncope.  Also had a fever.  Abdominal pain worsened and hence came to the ED. In the ED he had fever of 102.1 with tachycardia. CT abdomen pelvis showed perforated bowel with moderate free fluid and free air.  Patient was started on IV antibiotics, IV fluid Patient was admitted to ICU. General surgery was consulted 11/24, patient underwent exploratory laparotomy, descending and sigmoid colon resection, transverse colostomy by Dr. Marcello [name] See below for details.  Subjective: Patient was seen and examined this morning.  Pleasant elderly Caucasian male.  Propped up in bed.  Weak but alert, awake, able to answer questions.  Wife at bedside. Has NG tube to suction. Noted plan to start TPN today. Chart reviewed.  Tmax 100.9 last night, remains tachycardic to low 100s, blood pressure 140s this morning, breathing on 4 L oxygen by nasal cannula Last set of labs from this morning with WC count normal, hemoglobin at 10.2 with macrocytosis, potassium low at 2.9, renal function  normal  Assessment and plan: Acute peritonitis d/t perforated sigmoid colon S/p exploratory laparotomy, descending and sigmoid colon resection, transverse colostomy - 11/24 Sepsis - POA Currently on IV Zosyn, IV hydration Tmax 100.9 last night.  WBC count, lactic acid level normalized.  Blood culture 11/27 with no growth so far Pending surgical pathology report Pain management per general surgery.  Currently on as needed IV Dilaudid every 2 hours. Recent Labs  Lab 02/07/22 2030 02/07/22 2224 02/08/22 0726 02/09/22 0246 02/10/22 0252 02/11/22 0249 02/12/22 0300 02/13/22 0303  WBC 2.2*  --    < > 10.1 8.9 10.9* 8.8 7.8  LATICACIDVEN 2.3* 1.3  --   --   --   --   --   --    < > = values in this interval not displayed.   Postop ileus Has NG tube to suction.  General surgery following. 11/28, CT abdomen showed mild dilated small bowel loops with scattered air-fluid levels likely postop ileus.  It also showed scattered free fluid in the abdomen pelvis and small amount of free air and free fluid in the hepatorenal fossa without discrete drainable abscess. 11/28, PICC line placed for TPN  Suspected Crohn's colitis Recent colonoscopy with diffuse colitis and deep ulcerations through the entire colon suspicious for Crohn's colitis Colonoscopy biopsy pending report Currently not on steroids. Defer to GI.  Postop respiratory insufficiency Currently on 4 L oxygen nasal cannula. 11/28, CT scan showed small bilateral pleural effusion and moderate overlying atelectasis. Incentive spirometry, flutter valve, ambulation as tolerated Continue to wean down as tolerated  At Risk AKI  In setting of  n.p.o. status, high output from NG tube third spacing, contrast administration. Currently on IV hydration and TPN So far renal function is normal.  Continue to monitor. Recent Labs    10/11/21 1445 01/18/22 1449 02/07/22 2030 02/07/22 2031 02/08/22 0726 02/09/22 0246 02/10/22 0252  02/11/22 0249 02/12/22 0300 02/13/22 0303  BUN '17 12 16 15 17 22 22 15 16 14  '$ CREATININE 0.65 0.52 0.67 0.50* 0.65 0.68 0.60* 0.60* 0.67 0.65   Severe hypokalemia Continues to have low potassium level.  It is low at 2.9 today.  IV replacement ordered. Recent Labs  Lab 02/09/22 0246 02/10/22 0252 02/11/22 0249 02/12/22 0300 02/13/22 0303  K 3.8 3.1* 2.9* 2.6* 2.9*  MG  --  2.2 2.0 2.0 1.9  PHOS  --  3.0 4.1 3.1 2.7   Type 2 diabetes mellitus A1c 6.6 on 01/23/2022 PTA on metformin, Jardiance Currently on sliding scale insulin with Accu-Cheks every 4 hours.  May need long-acting insulin on TPN.  Continue to monitor Recent Labs  Lab 02/12/22 2029 02/13/22 0018 02/13/22 0514 02/13/22 0731 02/13/22 1213  GLUCAP 130* 113* 139* 132* 116*   Essential HTN lisinopril remains on hold   HLD Lipitor remains on hold  Obesity -Body mass index is 34.02 kg/m. Patient has been advised to make an attempt to improve diet and exercise patterns to aid in weight loss.  OSA Baseline CPAP 10 cm H20, followed by Dr. Ander [name] CPAP QSH   NGT may impede mask seal    Hx of ADHD, Anxiety methylphenidate on hold   Hx of Hypogonadism. Was on outpatient testosterone    Moderate Protein Calorie Malnutrition nutrition when ok per [facility]   Goals of care   Code Status: Full Code    Mobility: PT follow-up needed  Infusions:   chlorproMAZINE (THORAZINE) 12.5 mg in sodium chloride 0.9 % 25 mL IVPB Stopped (02/13/22 1005)   lactated ringers 100 mL/hr at 02/13/22 1217   lactated ringers     piperacillin-tazobactam (ZOSYN)  IV 3.375 g (02/13/22 1216)   potassium chloride 10 mEq (02/13/22 1215)   TPN ADULT (ION)      Scheduled Meds:  Chlorhexidine Gluconate Cloth  6 each Topical Daily   enoxaparin (LOVENOX) injection  0.5 mg/kg Subcutaneous Daily   insulin aspart  0-20 Units Subcutaneous Q4H   sodium chloride flush  10-40 mL Intracatheter Q12H   thiamine (VITAMIN B1) injection  100 mg  Intravenous Once    PRN meds: acetaminophen, chlorproMAZINE (THORAZINE) 12.5 mg in sodium chloride 0.9 % 25 mL IVPB, HYDROmorphone (DILAUDID) injection, ondansetron (ZOFRAN) IV, mouth rinse, sodium chloride flush   Skin assessment:     Nutritional status:  Body mass index is 34.02 kg/m.  Nutrition Problem: Severe Malnutrition Etiology: acute illness (new dx Crohn's colitis; bowel perforation) Signs/Symptoms: moderate fat depletion, moderate muscle depletion, percent weight loss Percent weight loss: 10 % (in 4 months)     Diet:  Diet Order             Diet NPO time specified Except for: Other (See Comments)  Diet effective now                   DVT prophylaxis:  Place and maintain sequential compression device Start: 02/08/22 0001   Antimicrobials: IV Zosyn Fluid: LR at 100 mill per hour and TPN Consultants: General surgery Family Communication: Wife at bedside  Status is: Inpatient  Continue in-hospital care because: Postop ileus, requiring NG tube decompression. Level of care: ICU  Dispo: The patient is from: Home              Anticipated d/c is to: Pending clinical course              Patient currently is not medically stable to d/c.   Difficult to place patient No       Antimicrobials: Anti-infectives (From admission, onward)    Start     Dose/Rate Route Frequency Ordered Stop   02/08/22 1200  piperacillin-tazobactam (ZOSYN) IVPB 3.375 g        3.375 g 12.5 mL/hr over 240 Minutes Intravenous Every 8 hours 02/08/22 0505     02/08/22 0500  piperacillin-tazobactam (ZOSYN) IVPB 3.375 g  Status:  Discontinued        3.375 g 12.5 mL/hr over 240 Minutes Intravenous Every 8 hours 02/08/22 0006 02/08/22 0505   02/08/22 0334  piperacillin-tazobactam (ZOSYN) 3.375 GM/50ML IVPB       Note to Pharmacy: Enis Gash W: cabinet override      02/08/22 0334 02/08/22 0345   02/07/22 2030  ceFEPIme (MAXIPIME) 2 g in sodium chloride 0.9 % 100 mL IVPB        2  g 200 mL/hr over 30 Minutes Intravenous  Once 02/07/22 2023 02/07/22 2248   02/07/22 2030  metroNIDAZOLE (FLAGYL) IVPB 500 mg        500 mg 100 mL/hr over 60 Minutes Intravenous  Once 02/07/22 2023 02/08/22 0506   02/07/22 2030  vancomycin (VANCOCIN) IVPB 1000 mg/200 mL premix        1,000 mg 200 mL/hr over 60 Minutes Intravenous  Once 02/07/22 2023 02/08/22 0506       Objective: Vitals:   02/13/22 1106 02/13/22 1200  BP: 131/64 (!) 147/92  Pulse: (!) 119 (!) 122  Resp: 11 (!) 24  Temp:  98.5 F (36.9 C)  SpO2: 92% 91%    Intake/Output Summary (Last 24 hours) at 02/13/2022 1318 Last data filed at 02/13/2022 1218 Gross per 24 hour  Intake 3235.67 ml  Output 4420 ml  Net -1184.33 ml   Filed Weights   02/08/22 0500 02/12/22 1801 02/13/22 0436  Weight: 109.1 kg 105 kg 104.5 kg   Weight change:  Body mass index is 34.02 kg/m.   Physical Exam: General exam: Pleasant elderly Caucasian male.  Not in pain currently Skin: No rashes, lesions or ulcers. HEENT: Atraumatic, normocephalic, no obvious bleeding.  NG tube attached to suction Lungs: Diminished air entry in both bases CVS: Regular rate and rhythm, no murmur GI/Abd soft, mild postop appropriate tenderness, bowel sounds minimal.  Ostomy bag with some liquid brown stool CNS: Drowsy, under the influence of pain medicine.  But able to carry out simple conversation Psychiatry: Sad affect Extremities: No pedal edema, no calf tenderness  Data Review: I have personally reviewed the laboratory data and studies available.  F/u labs ordered Unresulted Labs (From admission, onward)     Start     Ordered   02/14/22 0500  Triglycerides  (TPN Lab Panel)  Every Mon,Thu (0500),   R     Question:  Specimen collection method  Answer:  Lab=Lab collect   02/12/22 1801   02/13/22 0500  CBC with Differential/Platelet  Daily at 5am,   R     Question:  Specimen collection method  Answer:  Lab=Lab collect   02/12/22 0927   02/13/22  0500  Comprehensive metabolic panel  Daily at 5am,   R     Question:  Specimen  collection method  Answer:  Lab=Lab collect   02/12/22 0927   02/13/22 0500  Magnesium  Daily at 5am,   R     Question:  Specimen collection method  Answer:  Lab=Lab collect   02/12/22 0927   02/13/22 0500  Phosphorus  Daily at 5am,   R     Question:  Specimen collection method  Answer:  Lab=Lab collect   02/12/22 0927   02/13/22 0500  Testosterone  Tomorrow morning,   R       Question:  Specimen collection method  Answer:  Lab=Lab collect   02/12/22 1805   02/11/22 1800  Acid Fast Smear (AFB)  (AFB smear + Culture w reflexed sensitivities panel)  3 times daily,   R     See Hyperspace for full Linked Orders Report.   02/11/22 1544   02/11/22 1800  Acid Fast Culture with reflexed sensitivities  (AFB smear + Culture w reflexed sensitivities panel)  3 times daily,   R     See Hyperspace for full Linked Orders Report.   02/11/22 1544            Signed, Terrilee Croak, MD [facility] 02/13/2022

## 2022-02-13 NOTE — Progress Notes (Signed)
Physical Therapy Treatment Patient Details Name: Richard [name] MRN: [id number] DOB: February 17, 1956 Today's Date: 02/13/2022   History of Present Illness Patient is a 66 year old male who was admitted on 11/23 with bowel preforation. patient underwent exploratogy lap, sigmoid resection and transverse colostomy. PMH: IBD, crohn's colitis,COVID July 2022, HLD, HTN, OSA, PNA, Seborrheic dermatitis, Hypogonadism, DM type 2, and peripheral neuopathy.    PT Comments    Pt seen in [facility] Room # 6294 limited session Pt AxO x 3 in bed on 6 lts nasal asking for Ice Chips, c/o dry mouth.  Applied lip balm.  Pt feeling "poor;y" but willing to try to sit EOB.  Secured ABD binder which was still in place but open.  Educated pt on "Log Rolling" to minimize ABD pain.  Pt able to flex B knees AAROM and roll RIGHT with + 2 Max Assist.  Great difficulty self scooting, used bed pad to complete.  Poor sitting balance as pt required Mod Assist to maintain static sitting.  Pt c/o increased dizziness that did not decrease with time.  "I got to lay down", stated pt.  Total partial upright time EOB < 2 min.  Unable to tolerate OOB to recliner.  MAX c/o weakness/fatigue.  Assisted back to sidelying then rolling which required + 2 Total Assist pt < 10%.  Assisted with scooting to [facility] Total Assist pt 0%.  Positioned to comfort. Opened ABD binder.  Pt still requesting Ice Chips.  Reported to RN. Vitals Supine  HR 118 O2 95% on 6 lts  RR 20 BP 130/72(89) EOB      HR 132 O2 96% on 6 lts RR 20 BP 115/65(82) Supine  HR 122 O2 95% on 6 lts  RR 21 BP 143/68(91)   Recommendations for follow up therapy are one component of a multi-disciplinary discharge planning process, led by the attending physician.  Recommendations may be updated based on patient status, additional functional criteria and insurance authorization.  Follow Up Recommendations  Home health PT     Assistance Recommended at Discharge Frequent or constant  Supervision/Assistance  Patient can return home with the following Two people to help with walking and/or transfers;A lot of help with bathing/dressing/bathroom;Assistance with cooking/housework;Assist for transportation;Help with stairs or ramp for entrance   Equipment Recommendations  Rolling walker (2 wheels)    Recommendations for Other Services       Precautions / Restrictions Precautions Precaution Comments: NG suction, L colostomy,needs O2,ABD Binder Required Braces or Orthoses: Other Brace Other Brace: abdominal binder Restrictions Weight Bearing Restrictions: No     Mobility  Bed Mobility Overal bed mobility: Needs Assistance Bed Mobility: Rolling, Sidelying to Sit, Sit to Sidelying Rolling: Max assist, +2 for physical assistance, +2 for safety/equipment Sidelying to sit: Mod assist       General bed mobility comments: requried education on log rolling to sequence transition to EOB. needed increased physical A to complete task.    Transfers                   General transfer comment: unable to attempt due to increased dizziness EOB and overall feeling "really bad".    Ambulation/Gait                   Stairs             Wheelchair Mobility    Modified Rankin (Stroke Patients Only)       Balance  Cognition Arousal/Alertness: Awake/alert Behavior During Therapy: WFL for tasks assessed/performed Overall Cognitive Status: Within Functional Limits for tasks assessed                                 General Comments: AxO x 3 not feeling well.  C/O increased dizziness EOB.        Exercises      General Comments        Pertinent Vitals/Pain Pain Assessment Pain Assessment: Faces Faces Pain Scale: Hurts whole lot Pain Location: abdomen when rolling and sitting up EOB Pain Descriptors / Indicators: Grimacing, Guarding, Discomfort Pain Intervention(s):  Monitored during session, Repositioned    Home Living                          Prior Function            PT Goals (current goals can now be found in the care plan section) Progress towards PT goals: Progressing toward goals    Frequency    Min 3X/week      PT Plan Current plan remains appropriate    Co-evaluation              AM-PAC PT "6 Clicks" Mobility   Outcome Measure  Help needed turning from your back to your side while in a flat bed without using bedrails?: Total Help needed moving from lying on your back to sitting on the side of a flat bed without using bedrails?: Total Help needed moving to and from a bed to a chair (including a wheelchair)?: Total Help needed standing up from a chair using your arms (e.g., wheelchair or bedside chair)?: Total Help needed to walk in hospital room?: Total Help needed climbing 3-5 steps with a railing? : Total 6 Click Score: 6    End of Session Equipment Utilized During Treatment: Oxygen Activity Tolerance: Patient limited by fatigue Patient left: in bed;with call bell/phone within reach;with bed alarm set Nurse Communication: Mobility status PT Visit Diagnosis: Difficulty in walking, not elsewhere classified (R26.2);Pain     Time: 1415-1440 PT Time Calculation (min) (ACUTE ONLY): 25 min  Charges:  $Therapeutic Activity: 23-37 mins                     Rica Koyanagi  PTA Acute  Rehabilitation Services Office M-F          [phone] Weekend pager [phone]

## 2022-02-13 NOTE — Progress Notes (Signed)
Initial Nutrition Assessment  DOCUMENTATION CODES:   Severe malnutrition in context of acute illness/injury  INTERVENTION:  - Per Pharmacy, plan to start TPN tonight at 80m/hr: will provide 960 calories (9kcal/kg) and 47.6 (0.5g/kg).  - TPN management per pharmacy.   - Monitor magnesium, potassium, and phosphorus BID for at least 3 days, MD to replete as needed, as pt is at risk for refeeding syndrome given malnutrition, significant weight loss of 10% in the past 4 months, inadequate intake for several weeks with almost no intake ~1 week.  - K+ has been consistently low, being replaced per MD and pharmacy. - Recommend 1025mthiamine daily due to risk of refeeding risk.  - Monitor weights.   NUTRITION DIAGNOSIS:   Severe Malnutrition related to acute illness (new dx Crohn's colitis; bowel perforation) as evidenced by moderate fat depletion, moderate muscle depletion, 10% percent weight loss in 4 months.  GOAL:   Patient will meet greater than or equal to 90% of their needs  MONITOR:   Labs, Weight trends, Diet advancement  REASON FOR ASSESSMENT:   Consult New TPN/TNA  ASSESSMENT:   [age]/o male with PMH HTN, HLD, DMII, diverticulosis, and newly diagnosed Crohn's colitis who developed diarrhea about 3 to 4 weeks prior to admission and presented 11/24 with worsening abdominal pain, found to have bowel perforation.  11/24 admit, S/p exploratory lap, mobilization of the splenic flexure, descending and sigmoid colon resection and transverse colostomy; NG placed 11/26 having bowel function from ostomy, NG removed; clear liquid diet  Later in day patient had N/V, NG reinserted and 2L bilious fluid removed 11/28 Failed NG clamp trial, PICC placed-plan to start TPN 11/29 11/29 TPN to start tonight  Met with patient at bedside. He is unsure of an exact UBW but endorses weight loss over the past year, specifically the past few months. Per EMR, patient weighed between 250-260# over the  past 2 years and was last weighed at 256# in July before dropping to current weight of 230# this admission. This could show a 26# or 10% weight loss in the past 4 months, clinically significant.   Patient reports that over the past few weeks he has been eating poorly, mostly only able to tolerate chicken broth and saltine crackers. He notes he has had no intake for almost 1 week.   Patient is aware of plan to start TPN today, no questions noted.   Suspect patient is at a high risk of refeeding syndrome due to malnutrition, significant weight loss of 10% in the past 4 months, inadequate intake for several weeks with almost no intake ~1 week.  Discussed concern for refeeding syndrome with Pharmacist. Plan to start TPN tonight at 4062mr which will provide 960 calories (9kcal/kg) and 47.6 (0.5g/kg) and assess tolerance and electrolytes.   Medications reviewed and include: Insulin, K+ replacement every 1hr x6 + every 1 hr x3  Labs reviewed:   Latest Reference Range & Units 02/10/22 02:52 02/11/22 02:49 02/12/22 03:00 02/13/22 03:03  Potassium 3.5 - 5.1 mmol/L 3.1 (L) 2.9 (L) 2.6 (LL) 2.9 (L)  (LL): Data is critically low (L): Data is abnormally low  HA1C 6.6 Blood Glucose 113-139 x24 hours Triglycerides 143    NUTRITION - FOCUSED PHYSICAL EXAM:  Flowsheet Row Most Recent Value  Orbital Region Mild depletion  Upper Arm Region Moderate depletion  Thoracic and Lumbar Region Mild depletion  Buccal Region Moderate depletion  Temple Region Moderate depletion  Clavicle Bone Region Mild depletion  Clavicle and Acromion Bone  Region Mild depletion  Scapular Bone Region Unable to assess  Dorsal Hand No depletion  Patellar Region Moderate depletion  Anterior Thigh Region Moderate depletion  Posterior Calf Region Moderate depletion  Edema (RD Assessment) None  Hair Reviewed  Eyes Reviewed  Mouth Reviewed  Skin Reviewed  Nails Reviewed       Diet Order:   Diet Order             Diet  NPO time specified Except for: Other (See Comments)  Diet effective now                   EDUCATION NEEDS:  Education needs have been addressed  Skin:  Skin Assessment: Reviewed RN Assessment  Last BM:  11/29 - colostomy  Height:  Ht Readings from Last 1 Encounters:  02/08/22 _0  (1.753 m)   Weight:  Wt Readings from Last 1 Encounters:  02/13/22 104.5 kg   Ideal Body Weight:     BMI:  Body mass index is 34.02 kg/m.  Estimated Nutritional Needs:  Kcal:  4259-5638 kcals Protein:  135-155 grams Fluid:  >/= 2.3L    Samson Frederic RD, LDN For contact information, refer to [facility].

## 2022-02-13 NOTE — Consult Note (Signed)
[place] Nurse ostomy follow up Stoma type/location: LLQ, transverse colostomy Planned teaching visit with patient and wife, however upon my arrival patient has had many recent MD visits and PICC line placement, reports feeling poorly and not resting much last night.  Wife request later today or preferably in the am. I will plan to see them at 10am per her request 02/14/22. Stomal assessment/size: 2" round, budded  Peristomal assessment: NA Treatment options for stomal/peristomal skin: 2" skin barrier Output liquid brown Ostomy pouching: 2pc. 2  3/4 in place from pouch change Monday, however this skin barrier was snug at ostomy border and I wanted to assess stoma today for sure. Appears moist and pink, not compromised at all. Will plan for peristomal skin assessment with pouch change tomorrow  Education provided: none Enrolled patient in [place] Start Discharge program: Yes  [place] Nurse will follow along with you for continued support with ostomy teaching and care [place] MSN, [place], [place], [place], [place]

## 2022-02-13 NOTE — Progress Notes (Signed)
Peripherally Inserted Central Catheter Placement  The IV Nurse has discussed with the patient and/or persons authorized to consent for the patient, the purpose of this procedure and the potential benefits and risks involved with this procedure.  The benefits include less needle sticks, lab draws from the catheter, and the patient may be discharged home with the catheter. Risks include, but not limited to, infection, bleeding, blood clot (thrombus formation), and puncture of an artery; nerve damage and irregular heartbeat and possibility to perform a PICC exchange if needed/ordered by physician.  Alternatives to this procedure were also discussed.  Bard Power PICC patient education guide, fact sheet on infection prevention and patient information card has been provided to patient /or left at bedside.    PICC Placement Documentation  PICC Double Lumen 59/16/38 Right Basilic 41 cm 0 cm (Active)  Indication for Insertion or Continuance of Line Administration of hyperosmolar/irritating solutions (i.e. TPN, Vancomycin, etc.) 02/13/22 1030  Exposed Catheter (cm) 0 cm 02/13/22 1030  Site Assessment Clean, Dry, Intact 02/13/22 1030  Lumen #1 Status Flushed;Blood return noted;Saline locked 02/13/22 1030  Lumen #2 Status Blood return noted;Saline locked 02/13/22 1030  Dressing Type Transparent 02/13/22 1030  Dressing Status Antimicrobial disc in place 02/13/22 1030  Dressing Intervention New dressing 02/13/22 1030  Dressing Change Due 02/20/22 02/13/22 1030       Jyren Cerasoli Ramos 02/13/2022, 10:36 AM

## 2022-02-13 NOTE — Progress Notes (Signed)
6 Days Post-Op Ex lap, L colectomy and colostomy Subjective:  NAEO. No significant changes today. Ongoing hiccups. Pt with ongoing high output NG. Continue to have gas/stool via ostomy. Wants ice chips. Wife reports ongoing fevers and expresses concern about his generalized weakness.   Objective: Vital signs in last 24 hours: Temp:  [98.6 F (37 C)-100.9 F (38.3 C)] 100 F (37.8 C) (11/29 0800) Pulse Rate:  [92-115] 110 (11/29 0900) Resp:  [13-26] 19 (11/29 0900) BP: (108-146)/(53-111) 146/71 (11/29 0800) SpO2:  [94 %-99 %] 94 % (11/29 0900) Weight:  [104.5 kg-105 kg] 104.5 kg (11/29 0436)   Intake/Output from previous day: 11/28 0701 - 11/29 0700 In: 3071.8 [P.O.:230; I.V.:1956.3; NG/GT:40; IV Piggyback:845.5] Out: 5400 [Urine:1350; Emesis/NG output:3000; Stool:470] Intake/Output this shift: Total I/O In: 718.8 [P.O.:50; I.V.:352.9; IV Piggyback:315.9] Out: 867 [Urine:175; Emesis/NG output:700]   Gen: alert, NAD  CV: sinus tach Pulm: normal effort on  Abd: soft, mild to omderate distention, midline wound clean with fascia in tact, End colostomy with viable, edematous stoma, some light brown stool in pouch and gas. Appropriately tender without peritonitis  NG- >3,000 mL/24h  Ostomy -470 cc documented in epic last 24h  Lab Results:  Recent Labs    02/12/22 0300 02/13/22 0303  WBC 8.8 7.8  HGB 9.0* 10.2*  HCT 27.6* 32.1*  PLT 251 281   BMET Recent Labs    02/12/22 0300 02/13/22 0303  NA 141 143  K 2.6* 2.9*  CL 87* 86*  CO2 42* 43*  GLUCOSE 115* 140*  BUN 16 14  CREATININE 0.67 0.65  CALCIUM 7.4* 7.7*   PT/INR No results for input(s): "LABPROT", "INR" in the last 72 hours. ABG No results for input(s): "PHART", "HCO3" in the last 72 hours.  Invalid input(s): "PCO2", "PO2"  MEDS, Scheduled  Chlorhexidine Gluconate Cloth  6 each Topical Daily   enoxaparin (LOVENOX) injection  0.5 mg/kg Subcutaneous Daily   insulin aspart  0-20 Units Subcutaneous Q4H     Studies/Results: [place] EKG SITE RITE  Result Date: 02/12/2022 If Site Rite image not attached, placement could not be confirmed due to current cardiac rhythm.  CT ABDOMEN PELVIS WO CONTRAST  Result Date: 02/12/2022 CLINICAL DATA:  Five days postop exploratory laparotomy and left colectomy and colostomy. Rising white blood cell count. EXAM: CT ABDOMEN AND PELVIS WITHOUT CONTRAST TECHNIQUE: Multidetector CT imaging of the abdomen and pelvis was performed following the standard protocol without IV contrast. RADIATION DOSE REDUCTION: This exam was performed according to the departmental dose-optimization program which includes automated exposure control, adjustment of the mA and/or kV according to patient size and/or use of iterative reconstruction technique. COMPARISON:  CT scan 02/07/2022 FINDINGS: Lower chest: Small bilateral pleural effusions and moderate overlying atelectasis. Hepatobiliary: No hepatic lesions or intrahepatic biliary dilatation. High attenuation material in the gallbladder is likely vicarious excretion related to the prior CT scan. No common bile duct dilatation. Pancreas: No mass, inflammation or ductal dilatation. Spleen: Normal size.  No focal lesions. Adrenals/Urinary Tract: The adrenal glands and kidneys are unremarkable. The bladder is unremarkable. Stomach/Bowel: The stomach contains an NG tube. The duodenum is unremarkable. There are mildly dilated small bowel loops with scattered air-fluid levels, likely postop ileus. Patient has had a left colectomy. There is a left transverse colon colostomy and a Hartmann's pouch. Vascular/Lymphatic: The aorta is normal in caliber. Small scattered mesenteric and retroperitoneal lymph nodes. Reproductive: The prostate gland and seminal vesicles are unremarkable. Other: Scattered free fluid is noted in the abdomen and  pelvis. There is also a small amount of free air and free fluid in the hepatic renal fossa but I do not see a discrete  drainable abscess. I do not see a discrete Musculoskeletal: No significant bony findings. IMPRESSION: 1. Status post left colectomy with a left transverse colon colostomy and a Hartmann's pouch. 2. Scattered free fluid in the abdomen and pelvis and a small amount of free air and free fluid in the hepatorenal fossa but I do not see a discrete drainable abscess. 3. Mildly dilated small bowel loops with scattered air-fluid levels, likely postop ileus. 4. Small bilateral pleural effusions and moderate overlying atelectasis. Electronically Signed   By: Marijo Sanes M.D.   On: 02/12/2022 15:34    Assessment: s/p Procedure(s): EXPLORATORY LAPAROTOMY, COLON RESECTION, OSTOMY Patient Active Problem List   Diagnosis Date Noted   Perforated abdominal viscus 02/08/2022   IFG (impaired fasting glucose) 09/27/2015   Borderline hypertension 09/27/2015   Lateral epicondylitis of left elbow 01/27/2015   Fracture of toe, closed 05/18/2014   Seborrheic dermatitis 02/28/2014   Subacromial bursitis 11/09/2013   Bronchopneumonia 11/03/2013   Cerumen impaction 11/03/2013   Posterior interosseous nerve syndrome 10/25/2013   Hearing impairment 10/04/2013   Right shoulder pain 10/04/2013   Health maintenance examination 02/26/2013   Hyperlipidemia 06/15/2011   SEVERE OBSTRUCTIVE SLEEP APNEA with AHI 94 03/30/2010   Morbid obesity ([place]) 02/06/2010   Adult ADHD 03/07/2009   ALLERGIC RHINITIS 03/06/2009   DERMATITIS 03/06/2009    Expected post op course  Plan: Perforated sigmoid colon S/p EXPLORATORY LAPAROTOMY, MOBILIZATION OF SPLENIC FLEXURE, DESCENDING AND SIGMOID COLON RESECTION, TRANSVERSE COLOSTOMY 02/08/22 Dr. [name] - POD#5 - path: Benign colonic mucosa with focal transmural inflammation, edema and serositis compatible with perforation  - fever curve and HR are stable today.  - patient with increasing bowel function but also with high output NG. Continue clamp trials today. Plan to resume LIWS this  evening. - CT scan was ordered by [facility] yesterday due to fever/tachycardia. Shows ileus. No surgical complication or fluid collection.  - PT/OT - thorazine ordered for hiccups   IBD: awaiting path report, GI on board and following.  Hold on steroids for now to allow for post surgical healing  FEN: NPO, IVF, NG clamp trial, ok for limited ICE; hypokalemia being repleted by [facility] ID: Zosyn 11/24 >> ok to stop abx for intra-abdominal coverage today at 2359 from ccs standpoint. Pt with low grade fever and no clear source of infection at this point- it is possible that low grade temps are attributable to atelectasis. TB test came back indeterminate again - defer to medicine.  VTE: SCDs, Lovenox Foley:external cath Dispo: SDU   LOS: 5 days     Obie Dredge, [facility], [place]    02/13/2022 10:34 AM

## 2022-02-13 NOTE — TOC Initial Note (Addendum)
Transition of Care [facility]) - Initial/Assessment Note    Patient Details  Name: Richard [name] MRN: [id number] Date of Birth: 03-10-1956  [name]) CM/SW Contact:    Richard Dine, RN Phone Number: 02/13/2022, 10:42 AM  Clinical Narrative:                 [name] consult for HHPT; unable to speak to pt d/t sterile procedure in progress; spoke with pt's wife Richard [name] ([phone]); she says the pt is from home and plans to return at d/c; the pt wears glasses; she says he does not have [place] services or DME; Mrs Montoya agrees to [employer] services; she says she does not have an agency preference; also explained to pt's wife Richard [name] agency contact information will be placed in follow up provider section of d/c instructions contacted Richard [name] at [facility] and she says they accept pt; pt's wife notified; TOC will con't to follow.    Expected Discharge Plan: Home/Self Care Barriers to Discharge: Continued Medical Work up   Patient Goals and CMS Choice Patient states their goals for this hospitalization and ongoing recovery are:: home      Expected Discharge Plan and Services Expected Discharge Plan: Home/Self Care   Discharge Planning Services: CM Consult   Living arrangements for the past 2 months: Single Family Home                                      Prior Living Arrangements/Services Living arrangements for the past 2 months: Single Family Home Lives with:: Spouse Patient language and need for interpreter reviewed:: Yes Do you feel safe going back to the place where you live?: Yes      Need for Family Participation in Patient Care: Yes (Comment) Care giver support system in place?: Yes (comment)   Criminal Activity/Legal Involvement Pertinent to Current Situation/Hospitalization: No - Comment as needed  Activities of Daily Living Home Assistive Devices/Equipment: Eyeglasses ADL Screening (condition at time of admission) Patient's cognitive ability adequate  to safely complete daily activities?: Yes Is the patient deaf or have difficulty hearing?: No Does the patient have difficulty seeing, even when wearing glasses/contacts?: Yes Does the patient have difficulty concentrating, remembering, or making decisions?: No Patient able to express need for assistance with ADLs?: Yes Does the patient have difficulty dressing or bathing?: No Independently performs ADLs?: Yes (appropriate for developmental age) Does the patient have difficulty walking or climbing stairs?: No Weakness of Legs: None Weakness of Arms/Hands: None  Permission Sought/Granted Permission sought to share information with : Richard [name] Permission granted to share information with : Yes, Verbal Permission Granted  Share Information with NAME: Richard Coffin, RN, CM           Emotional Assessment         Alcohol / Substance Use: Not Applicable Psych Involvement: No (comment)  Admission diagnosis:  Pneumoperitoneum [K66.8] Perforated abdominal viscus [R19.8] Sepsis with encephalopathy without septic shock, due to unspecified organism ([place]) [A41.9, R65.20, G93.41] Patient Active Problem List   Diagnosis Date Noted   Perforated abdominal viscus 02/08/2022   IFG (impaired fasting glucose) 09/27/2015   Borderline hypertension 09/27/2015   Lateral epicondylitis of left elbow 01/27/2015   Fracture of toe, closed 05/18/2014   Seborrheic dermatitis 02/28/2014   Subacromial bursitis 11/09/2013   Bronchopneumonia 11/03/2013   Cerumen impaction 11/03/2013   Posterior interosseous nerve syndrome 10/25/2013  Hearing impairment 10/04/2013   Right shoulder pain 10/04/2013   Health maintenance examination 02/26/2013   Hyperlipidemia 06/15/2011   SEVERE OBSTRUCTIVE SLEEP APNEA with AHI 94 03/30/2010   Morbid obesity ([place]) 02/06/2010   Adult ADHD 03/07/2009   ALLERGIC RHINITIS 03/06/2009   DERMATITIS 03/06/2009   PCP:  Richard Sou, MD Pharmacy:   [id number]  [place], [address] [address]Phone: [phone]: [phone] [facility] [address]SS[place]Phone: [phone]: [phone]    Social Determinants of Health (SDOH) Interventions    Readmission Risk Interventions     No data to display

## 2022-02-14 DIAGNOSIS — R198 Other specified symptoms and signs involving the digestive system and abdomen: Secondary | ICD-10-CM | POA: Diagnosis not present

## 2022-02-14 LAB — COMPREHENSIVE METABOLIC PANEL
ALT: 13 U/L (ref 0–44)
AST: 24 U/L (ref 15–41)
Albumin: 1.5 g/dL — ABNORMAL LOW (ref 3.5–5.0)
Alkaline Phosphatase: 69 U/L (ref 38–126)
BUN: 13 mg/dL (ref 8–23)
CO2: >45 mmol/L — ABNORMAL HIGH (ref 22–32)
Calcium: 7.7 mg/dL — ABNORMAL LOW (ref 8.9–10.3)
Chloride: 86 mmol/L — ABNORMAL LOW (ref 98–111)
Creatinine, Ser: 0.48 mg/dL — ABNORMAL LOW (ref 0.61–1.24)
GFR, Estimated: >60 mL/min (ref >60)
Glucose, Bld: 188 mg/dL — ABNORMAL HIGH (ref 70–99)
Potassium: 2.2 mmol/L — CL (ref 3.5–5.1)
Sodium: 140 mmol/L (ref 135–145)
Total Bilirubin: 0.6 mg/dL (ref 0.3–1.2)
Total Protein: 5.6 g/dL — ABNORMAL LOW (ref 6.5–8.1)

## 2022-02-14 LAB — BLOOD GAS, ARTERIAL
Acid-Base Excess: 31.2 mmol/L — ABNORMAL HIGH (ref 0.0–2.0)
Bicarbonate: 57.9 mmol/L — ABNORMAL HIGH (ref 20.0–28.0)
Drawn by: 22052
FIO2: 40 %
O2 Content: 6 L/min
O2 Saturation: 95.2 %
Patient temperature: 37
pCO2 arterial: 59 mmHg — ABNORMAL HIGH (ref 32–48)
pH, Arterial: 7.6 — ABNORMAL HIGH (ref 7.35–7.45)
pO2, Arterial: 69 mmHg — ABNORMAL LOW (ref 83–108)

## 2022-02-14 LAB — GLUCOSE, CAPILLARY
Glucose-Capillary: 160 mg/dL — ABNORMAL HIGH (ref 70–99)
Glucose-Capillary: 162 mg/dL — ABNORMAL HIGH (ref 70–99)
Glucose-Capillary: 170 mg/dL — ABNORMAL HIGH (ref 70–99)
Glucose-Capillary: 183 mg/dL — ABNORMAL HIGH (ref 70–99)
Glucose-Capillary: 185 mg/dL — ABNORMAL HIGH (ref 70–99)
Glucose-Capillary: 196 mg/dL — ABNORMAL HIGH (ref 70–99)
Glucose-Capillary: 200 mg/dL — ABNORMAL HIGH (ref 70–99)

## 2022-02-14 LAB — CBC WITH DIFFERENTIAL/PLATELET
Abs Immature Granulocytes: 0.07 10*3/uL (ref 0.00–0.07)
Basophils Absolute: 0 10*3/uL (ref 0.0–0.1)
Basophils Relative: 0 %
Eosinophils Absolute: 0 10*3/uL (ref 0.0–0.5)
Eosinophils Relative: 0 %
HCT: 26.4 % — ABNORMAL LOW (ref 39.0–52.0)
Hemoglobin: 8.5 g/dL — ABNORMAL LOW (ref 13.0–17.0)
Immature Granulocytes: 1 %
Lymphocytes Relative: 3 %
Lymphs Abs: 0.2 10*3/uL — ABNORMAL LOW (ref 0.7–4.0)
MCH: 33.3 pg (ref 26.0–34.0)
MCHC: 32.2 g/dL (ref 30.0–36.0)
MCV: 103.5 fL — ABNORMAL HIGH (ref 80.0–100.0)
Monocytes Absolute: 0.2 10*3/uL (ref 0.1–1.0)
Monocytes Relative: 2 %
Neutro Abs: 7.4 10*3/uL (ref 1.7–7.7)
Neutrophils Relative %: 94 %
Platelets: 206 10*3/uL (ref 150–400)
RBC: 2.55 MIL/uL — ABNORMAL LOW (ref 4.22–5.81)
RDW: 13.8 % (ref 11.5–15.5)
WBC: 8 10*3/uL (ref 4.0–10.5)
nRBC: 0 % (ref 0.0–0.2)

## 2022-02-14 LAB — BASIC METABOLIC PANEL
Anion gap: 7 (ref 5–15)
BUN: 11 mg/dL (ref 8–23)
CO2: 43 mmol/L — ABNORMAL HIGH (ref 22–32)
Calcium: 7.6 mg/dL — ABNORMAL LOW (ref 8.9–10.3)
Chloride: 88 mmol/L — ABNORMAL LOW (ref 98–111)
Creatinine, Ser: 0.48 mg/dL — ABNORMAL LOW (ref 0.61–1.24)
GFR, Estimated: >60 mL/min (ref >60)
Glucose, Bld: 202 mg/dL — ABNORMAL HIGH (ref 70–99)
Potassium: 2.8 mmol/L — ABNORMAL LOW (ref 3.5–5.1)
Sodium: 138 mmol/L (ref 135–145)

## 2022-02-14 LAB — MAGNESIUM: Magnesium: 2 mg/dL (ref 1.7–2.4)

## 2022-02-14 LAB — TESTOSTERONE: Testosterone: 57 ng/dL — ABNORMAL LOW (ref 264–916)

## 2022-02-14 LAB — PHOSPHORUS: Phosphorus: 1.9 mg/dL — ABNORMAL LOW (ref 2.5–4.6)

## 2022-02-14 LAB — TRIGLYCERIDES: Triglycerides: 142 mg/dL (ref <150)

## 2022-02-14 MED ORDER — POTASSIUM CHLORIDE IN NACL 40-0.9 MEQ/L-% IV SOLN
INTRAVENOUS | Status: DC
  Filled 2022-02-14: qty 1000

## 2022-02-14 MED ORDER — POTASSIUM PHOSPHATES 15 MMOLE/5ML IV SOLN
30.0000 mmol | Freq: Once | INTRAVENOUS | Status: AC
  Administered 2022-02-14: 30 mmol via INTRAVENOUS
  Filled 2022-02-14: qty 10

## 2022-02-14 MED ORDER — SODIUM CHLORIDE 0.45 % IV SOLN
INTRAVENOUS | Status: DC
  Filled 2022-02-14: qty 1000

## 2022-02-14 MED ORDER — TRAVASOL 10 % IV SOLN
INTRAVENOUS | Status: AC
  Filled 2022-02-14: qty 576

## 2022-02-14 MED ORDER — POTASSIUM CHLORIDE IN NACL 40-0.9 MEQ/L-% IV SOLN
INTRAVENOUS | Status: DC
  Filled 2022-02-14 (×2): qty 1000

## 2022-02-14 MED ORDER — POTASSIUM CHLORIDE 10 MEQ/50ML IV SOLN
10.0000 meq | INTRAVENOUS | Status: AC
  Administered 2022-02-14 – 2022-02-15 (×6): 10 meq via INTRAVENOUS
  Filled 2022-02-14 (×6): qty 50

## 2022-02-14 MED ORDER — SODIUM CHLORIDE 0.9 % IV SOLN: INTRAVENOUS | Status: DC

## 2022-02-14 MED ORDER — PANTOPRAZOLE SODIUM 40 MG IV SOLR
40.0000 mg | INTRAVENOUS | Status: DC
  Administered 2022-02-14 – 2022-02-21 (×8): 40 mg via INTRAVENOUS
  Filled 2022-02-14 (×8): qty 10

## 2022-02-14 MED ORDER — SODIUM CHLORIDE 0.45 % IV SOLN
INTRAVENOUS | Status: DC
  Filled 2022-02-14 (×7): qty 1000

## 2022-02-14 MED ORDER — POTASSIUM CHLORIDE 10 MEQ/100ML IV SOLN
10.0000 meq | INTRAVENOUS | Status: DC
  Administered 2022-02-14 (×2): 10 meq via INTRAVENOUS
  Filled 2022-02-14 (×4): qty 100

## 2022-02-14 MED ORDER — POTASSIUM CHLORIDE 10 MEQ/100ML IV SOLN
10.0000 meq | INTRAVENOUS | Status: AC
  Administered 2022-02-14 (×4): 10 meq via INTRAVENOUS
  Filled 2022-02-14 (×3): qty 100

## 2022-02-14 NOTE — Progress Notes (Signed)
PROGRESS NOTE  Richard [name]  DOB: [birth year]  PCP: [name], MD VCB:[id number]  DOA: 02/07/2022  LOS: 6 days  Hospital Day: 8  Brief narrative: Richard [name] is a 66 y.o. male with PMH significant for DM2, HTN, HLD, obesity, OSA, hypogonadism, peripheral neuropathy.  11/7, seen in the office by Dr. [name] with complaint of diarrhea for about 3 to 4 months, 6-8 episodes a day, mixed with blood associated with abdominal cramping and progressive weakness.  Stool studies at that time were negative for infectious etiology.  Sed rate was elevated 72, fecal calprotectin was elevated to 3570. 11/20, underwent colonoscopy, found to have diffuse colitis with deep ulcerations throughout the entire colon friable mucosa as well as extensive mucosal edema in the rectum.  He was started on prednisone 40 mg daily. After colonoscopy, pat ient developed worsening abdominal pain. 11/23, at home patient had 1 episode of syncope.  Also had a fever.  Abdominal pain worsened and hence came to the ED. In the ED he had fever of 102.1 with tachycardia. CT abdomen pelvis showed perforated bowel with moderate free fluid and free air.  Patient was started on IV antibiotics, IV fluid Patient was admitted to ICU. General surgery was consulted 11/24, patient underwent exploratory laparotomy, descending and sigmoid colon resection, transverse colostomy by Dr. [name] See below for details.  Subjective: Patient was seen and examined this morning.  Somnolent today.  Tachycardic mostly.  On 6 L oxygen nasal cannula  NG tube remains to suction.  Significant output.  Has some outputting ostomy bag as well. Tmax 101 last night. A.m. lab with low potassium, low phosphorus on TPN Had ABG done around noon today showed significantly alkalotic pH at 7.6 with both pCO2 and serum bicarb elevated.  Assessment and plan: Acute peritonitis d/t perforated sigmoid colon Inflammatory bowel disease S/p exploratory  laparotomy, descending and sigmoid colon resection, transverse colostomy - 11/24 Sepsis - POA Currently on IV Zosyn, IV hydration Tmax 101 last night.  WBC count, lactic acid level normalized.  Blood culture 11/27 with no growth so far surgical pathology report showed transmural inflammation consistent with IBD Pain management per general surgery.  Currently on as needed IV Dilaudid. Recent Labs  Lab 02/07/22 2030 02/07/22 2224 02/08/22 0726 02/10/22 0252 02/11/22 0249 02/12/22 0300 02/13/22 0303 02/14/22 0500  WBC 2.2*  --    < > 8.9 10.9* 8.8 7.8 8.0  LATICACIDVEN 2.3* 1.3  --   --   --   --   --   --    < > = values in this interval not displayed.   Postop ileus 11/28, CT abdomen showed mild dilated small bowel loops with scattered air-fluid levels likely postop ileus.  It also showed scattered free fluid in the abdomen pelvis and small amount of free air and free fluid in the hepatorenal fossa without discrete drainable abscess. Continues to have high volume NG tube on suction.  General surgery following. 11/28, PICC line placed for TPN  Acute metabolic alkalosis Blood gas today with pH 7.6, pCO2 59, serum bicarb 58 Patient probably has primary metabolic alkalosis -due to NG tube loss.  CO2 rise is probably as a compensation. Nephrology consulted.  May need Diamox.  Postop respiratory insufficiency Currently on 6L oxygen nasal cannula. 11/28, CT scan showed small bilateral pleural effusion and moderate overlying atelectasis. Incentive spirometry, flutter valve, ambulation as tolerated Continue to wean down as tolerated  At Risk AKI  In setting of n.p.o. status,  high output from NG tube third spacing, contrast administration. Currently on IV hydration and TPN So far renal function is normal.  Continue to monitor. Recent Labs    01/18/22 1449 02/07/22 2030 02/07/22 2031 02/08/22 0726 02/09/22 0246 02/10/22 0252 02/11/22 0249 02/12/22 0300 02/13/22 0303  02/14/22 0500  BUN '12 16 15 17 22 22 15 16 14 13  '$ CREATININE 0.52 0.67 0.50* 0.65 0.68 0.60* 0.60* 0.67 0.65 0.48*   Severe hypokalemia/hypophosphatemia Continues to have low potassium level, very low at 2.2 today.  Phosphorus level is also low at 1.9.  Probably due to refeeding syndrome after TPN was started yesterday.  Discussed with pharmacy.  Electrolytes replaced.  IV potassium piggyback added to IV fluid as well Recent Labs  Lab 02/10/22 0252 02/11/22 0249 02/12/22 0300 02/13/22 0303 02/14/22 0500  K 3.1* 2.9* 2.6* 2.9* 2.2*  MG 2.2 2.0 2.0 1.9 2.0  PHOS 3.0 4.1 3.1 2.7 1.9*   Type 2 diabetes mellitus A1c 6.6 on 01/23/2022 PTA on metformin, Jardiance Currently on sliding scale insulin with Accu-Cheks every 4 hours.  May need long-acting insulin on TPN.  Continue to monitor Recent Labs  Lab 02/13/22 1956 02/14/22 0038 02/14/22 0359 02/14/22 0836 02/14/22 1141  GLUCAP 151* 162* 196* 183* 200*   Essential HTN lisinopril remains on hold   HLD Lipitor remains on hold  Obesity -Body mass index is 34.02 kg/m. Patient has been advised to make an attempt to improve diet and exercise patterns to aid in weight loss.  OSA Baseline CPAP 10 cm H20, followed by Dr. Ander Slade CPAP QSH   NGT may impede mask seal    Hx of ADHD, Anxiety methylphenidate on hold   Hx of Hypogonadism. Was on outpatient testosterone    Moderate Protein Calorie Malnutrition nutrition when ok per [facility]   Goals of care   Code Status: Full Code    Mobility: PT follow-up needed  Infusions:   0.9 % NaCl with KCl 40 mEq / L 75 mL/hr at 02/14/22 1441   chlorproMAZINE (THORAZINE) 12.5 mg in sodium chloride 0.9 % 25 mL IVPB Stopped (02/14/22 0913)   piperacillin-tazobactam (ZOSYN)  IV 12.5 mL/hr at 02/14/22 1441   potassium chloride 100 mL/hr at 02/14/22 1441   potassium PHOSPHATE IVPB (in mmol) 85 mL/hr at 02/14/22 1441   TPN ADULT (ION) 40 mL/hr at 02/14/22 1441   TPN ADULT (ION)       Scheduled Meds:  Chlorhexidine Gluconate Cloth  6 each Topical Daily   enoxaparin (LOVENOX) injection  0.5 mg/kg Subcutaneous Daily   insulin aspart  0-20 Units Subcutaneous Q4H   sodium chloride flush  10-40 mL Intracatheter Q12H    PRN meds: acetaminophen, chlorproMAZINE (THORAZINE) 12.5 mg in sodium chloride 0.9 % 25 mL IVPB, HYDROmorphone (DILAUDID) injection, ondansetron (ZOFRAN) IV, mouth rinse, sodium chloride flush   Skin assessment:     Nutritional status:  Body mass index is 34.02 kg/m.  Nutrition Problem: Severe Malnutrition Etiology: acute illness (new dx Crohn's colitis; bowel perforation) Signs/Symptoms: moderate fat depletion, moderate muscle depletion, percent weight loss Percent weight loss: 10 % (in 4 months)     Diet:  Diet Order             Diet NPO time specified Except for: Other (See Comments)  Diet effective now                   DVT prophylaxis:  Place and maintain sequential compression device Start: 02/08/22 0001   Antimicrobials:  IV Zosyn Fluid: NS at 31 mill per hour with K along with TPN Consultants: General surgery Family Communication: Wife at bedside  Status is: Inpatient  Continue in-hospital care because: Postop ileus, requiring NG tube decompression. Level of care: ICU   Dispo: The patient is from: Home              Anticipated d/c is to: Pending clinical course              Patient currently is not medically stable to d/c.   Difficult to place patient No       Antimicrobials: Anti-infectives (From admission, onward)    Start     Dose/Rate Route Frequency Ordered Stop   02/08/22 1200  piperacillin-tazobactam (ZOSYN) IVPB 3.375 g        3.375 g 12.5 mL/hr over 240 Minutes Intravenous Every 8 hours 02/08/22 0505     02/08/22 0500  piperacillin-tazobactam (ZOSYN) IVPB 3.375 g  Status:  Discontinued        3.375 g 12.5 mL/hr over 240 Minutes Intravenous Every 8 hours 02/08/22 0006 02/08/22 0505   02/08/22 0334   piperacillin-tazobactam (ZOSYN) 3.375 GM/50ML IVPB       Note to Pharmacy: Enis Gash W: cabinet override      02/08/22 0334 02/08/22 0345   02/07/22 2030  ceFEPIme (MAXIPIME) 2 g in sodium chloride 0.9 % 100 mL IVPB        2 g 200 mL/hr over 30 Minutes Intravenous  Once 02/07/22 2023 02/07/22 2248   02/07/22 2030  metroNIDAZOLE (FLAGYL) IVPB 500 mg        500 mg 100 mL/hr over 60 Minutes Intravenous  Once 02/07/22 2023 02/08/22 0506   02/07/22 2030  vancomycin (VANCOCIN) IVPB 1000 mg/200 mL premix        1,000 mg 200 mL/hr over 60 Minutes Intravenous  Once 02/07/22 2023 02/08/22 0506       Objective: Vitals:   02/14/22 1200 02/14/22 1400  BP: 131/64 132/64  Pulse: (!) 107 (!) 116  Resp: 16 (!) 21  Temp: (!) 100.8 F (38.2 C) 99.4 F (37.4 C)  SpO2: 99% 99%    Intake/Output Summary (Last 24 hours) at 02/14/2022 1520 Last data filed at 02/14/2022 1441 Gross per 24 hour  Intake 4015.15 ml  Output 2575 ml  Net 1440.15 ml   Filed Weights   02/08/22 0500 02/12/22 1801 02/13/22 0436  Weight: 109.1 kg 105 kg 104.5 kg   Weight change:  Body mass index is 34.02 kg/m.   Physical Exam: General exam: Pleasant elderly Caucasian male.  Lethargic today Skin: No rashes, lesions or ulcers. HEENT: Atraumatic, normocephalic, no obvious bleeding.  NG tube attached to suction with significant bilious output Lungs: Diminished air entry in both bases CVS: Regular rate and rhythm, no murmur GI/Abd soft, mild postop appropriate tenderness, bowel sounds minimal.  Ostomy bag with some liquid brown stool CNS: Drowsy, under the influence of pain medicine.  But able to carry out simple conversation Psychiatry: Sad affect Extremities: No pedal edema, no calf tenderness  Data Review: I have personally reviewed the laboratory data and studies available.  F/u labs ordered Unresulted Labs (From admission, onward)     Start     Ordered   02/14/22 0500  Triglycerides  (TPN Lab Panel)   Every Mon,Thu (0500),   R     Question:  Specimen collection method  Answer:  Lab=Lab collect   02/12/22 1801   02/13/22 0500  CBC with  Differential/Platelet  Daily at 5am,   R     Question:  Specimen collection method  Answer:  Lab=Lab collect   02/12/22 0927   02/13/22 0500  Comprehensive metabolic panel  Daily at 5am,   R     Question:  Specimen collection method  Answer:  Lab=Lab collect   02/12/22 0927   02/13/22 0500  Magnesium  Daily at 5am,   R     Question:  Specimen collection method  Answer:  Lab=Lab collect   02/12/22 0927   02/13/22 0500  Phosphorus  Daily at 5am,   R     Question:  Specimen collection method  Answer:  Lab=Lab collect   02/12/22 0927   02/11/22 1800  Acid Fast Smear (AFB)  (AFB smear + Culture w reflexed sensitivities panel)  3 times daily,   R     See Hyperspace for full Linked Orders Report.   02/11/22 1544   02/11/22 1800  Acid Fast Culture with reflexed sensitivities  (AFB smear + Culture w reflexed sensitivities panel)  3 times daily,   R     See Hyperspace for full Linked Orders Report.   02/11/22 1544            Signed, Terrilee Croak, MD [facility] 02/14/2022

## 2022-02-14 NOTE — Progress Notes (Signed)
Patient saturation on 6L was 98%. ABG was done

## 2022-02-14 NOTE — Consult Note (Signed)
[employer] Nurse ostomy follow up Stoma type/location: LUQ, transverse colostomy  Stomal assessment/size: 2", pink, edematous  Peristomal assessment: pink/purple discoloration circumferentially (matching pattern of contact with barrier ring and skin barrier pectin.  ? Contact dermatitis vs pressure from abdominal distention. May need to convert to flat one piece or even 4" to accommodate stoma and relieve pressure. Tape border does not seem to be affecting the skin at all  Treatment options for stomal/peristomal skin: used skin barrier wipe to treat discolored skin Output liquid brown Ostomy pouching: 2pc. 2 3/4" with skin barrier ring. 4" pouches and extra 2 3/4" in the room  Education provided:  Met with patient and his wife.  He is very lethargic and has audible congestion with breathing.  He barely awakens with pouch change. He is not able to participate at all in his care currently.  Wife is very engaged to learn care. Discussed normal mucous production to alleviate fears of wife. And presence of sutures and slough along mucocutaneous border.  Wife performed most of pouch change (cutting new skin barrier, placement of skin barrier ring.   I explained discoloration of the peristoma skin;possible contact dermatitis and rationale for use of skin barrier wipe to protect skin.  I place ostomy powder in the room for use next week should this not resolve.  Education on emptying when 1/3 to 1/2 full and how to empty Demonstrated "burping" flatus from pouch Demonstrated use of wick to clean spout and rationale for cleaning spout Discussed bathing, diet, gas.    Wife needs further support with placement of barrier ring as she was a little anxious and tore barrier ring apart with stretching.   Enrolled patient in [employer] Start Discharge program: Yes  Wife aware planning next teaching visit early next week.   Thanks  Lisia Westbay [employer], RN,CWOCN, CNS, [place] [phone])

## 2022-02-14 NOTE — Progress Notes (Signed)
Pt still has NG tube in place.  Cpap held at this time.

## 2022-02-14 NOTE — Progress Notes (Deleted)
Patient oxygen saturation at 125 on 6L =99% ABG drawn

## 2022-02-14 NOTE — Progress Notes (Addendum)
PHARMACY - TOTAL PARENTERAL NUTRITION CONSULT NOTE   Indication: Prolonged ileus  Patient Measurements: Height: '5\' 9"'$  (175.3 cm) Weight: 104.5 kg (230 lb 6.1 oz) IBW/kg (Calculated) : 70.7 TPN AdjBW (KG): 80.3 Body mass index is 34.02 kg/m. Usual Weight:   Assessment: 66 yo M s/p colonic perforation status post exploratory lap, partial colectomy and transverse colostomy 02/08/2022  Pharmacy consulted to manage TPN for prolonged ileus.  Failed NGT clamping trial 11/28.   PMHx Scrotal abscess August 2023, Colon polyp, Diverticulosis, ADHD, Anxiety, Rhinitis, COVID July 2022, HLD, HTN, OSA, PNA, Seborrheic dermatitis, Hypogonadism, DM type 2, Peripheral neuropathy, former smoker,    Glucose / Insulin: hx DM2 on metformin, Jardiance PTA Goal CBG < 180. CBGs all < 140 prior to TPN CBGs: 151,162, 196 on TPN @ 40 ml/hr, serum gluc 188 Q4h resistant SSI: 18 units/24 hrs Electrolytes: K 2.2 after 8 runs yesterday, 6 runs ordered by TRH; Phos 1.9, Mag 2, CoCa 9.7 Renal: SCr WNL Hepatic: LFT WNL, Trig 142, ALb 1.5 Intake / Output;I/O neg 1748 NGO: 2400/24 hrs. Stool out 75/24 hrs. UOP 625/24 hrs.   MIVF: LR 60 ml/hr GI Imaging:  11/28 CT A/P: post op ileus GI Surgeries / Procedures:  02/08/22:  colonic perforation status post exploratory lap, partial colectomy and transverse colostomy  Central access: PICC placed 11/28 for TPN TPN start date: 02/13/22  Nutritional Goals: Goal TPN rate is 96 mL/hr (provides 138 g of protein and 2304 kcals per day)  RD Assessment: 11/29 Estimated Needs Total Energy Estimated Needs: 2300-2600 kcals Total Protein Estimated Needs: 135-155 grams Total Fluid Estimated Needs: >/= 2.3LKcal: 2300-2600 kcals Protein: 135-155 grams  Fluid: >/= 2.3  Current Nutrition:  NPO  Plan:  6 runs K per MD, KPhos 30 mM provides 44 meq K  Continue TPN at 40 mL/hr until electrolytes stabilized (provides 47.6 g protein & 960 Kcal) Electrolytes in TPN: Na 98mq/L, K  566m/L, Ca 24m46mL, Mg 24mE66m, and Phos 124mm30m. Cl:Ac 1:1 Add standard MVI and trace elements to TPN Add thiamine to TPN  continue Resistant q4h SSI and adjust as needed  MIVFL LR @ 60 mL/hr to keep total IVF 100 ml/hr or per MD - consider changing to IVF with K Monitor TPN labs on Mon/Thurs, CMET, Mg & Phos in AM ordered by MD  MicheEudelia Bunchrm.D Use secure chat for questions 02/14/2022 8:27 AM

## 2022-02-14 NOTE — Progress Notes (Signed)
ABG completed , called and sent to lab 

## 2022-02-14 NOTE — Progress Notes (Signed)
7 Days Post-Op   Chief Complaint/Subjective: Pain issues overnight, fevers this morning  Objective: Vital signs in last 24 hours: Temp:  [98.2 F (36.8 C)-101 F (38.3 C)] 99.2 F (37.3 C) (11/30 0400) Pulse Rate:  [60-122] 108 (11/30 0600) Resp:  [11-29] 15 (11/30 0600) BP: (117-147)/(55-92) 128/58 (11/30 0600) SpO2:  [91 %-100 %] 97 % (11/30 0600) Last BM Date : 02/13/22 Intake/Output from previous day: 11/29 0701 - 11/30 0700 In: 2303.2 [P.O.:430; I.V.:1112.2; IV Piggyback:760.9] Out: 3550 [Urine:1075; Emesis/NG output:2400; Stool:75]  PE: Gen: NAD Resp: nonlabored Card: tachycardic Abd: ostomy pink with small amount of output, slight distension, wound with clean base  Lab Results:  Recent Labs    02/13/22 0303 02/14/22 0500  WBC 7.8 8.0  HGB 10.2* 8.5*  HCT 32.1* 26.4*  PLT 281 206   Recent Labs    02/13/22 0303 02/14/22 0500  NA 143 140  K 2.9* 2.2*  CL 86* 86*  CO2 43* >45*  GLUCOSE 140* 188*  BUN 14 13  CREATININE 0.65 0.48*  CALCIUM 7.7* 7.7*   No results for input(s): "LABPROT", "INR" in the last 72 hours.    Component Value Date/Time   NA 140 02/14/2022 0500   K 2.2 (LL) 02/14/2022 0500   CL 86 (L) 02/14/2022 0500   CO2 >45 (H) 02/14/2022 0500   GLUCOSE 188 (H) 02/14/2022 0500   BUN 13 02/14/2022 0500   CREATININE 0.48 (L) 02/14/2022 0500   CREATININE 0.63 (L) 03/10/2019 1436   CALCIUM 7.7 (L) 02/14/2022 0500   PROT 5.6 (L) 02/14/2022 0500   ALBUMIN 1.5 (L) 02/14/2022 0500   AST 24 02/14/2022 0500   ALT 13 02/14/2022 0500   ALKPHOS 69 02/14/2022 0500   BILITOT 0.6 02/14/2022 0500   GFRNONAA >60 02/14/2022 0500    Anti-infectives: Anti-infectives (From admission, onward)    Start     Dose/Rate Route Frequency Ordered Stop   02/08/22 1200  piperacillin-tazobactam (ZOSYN) IVPB 3.375 g        3.375 g 12.5 mL/hr over 240 Minutes Intravenous Every 8 hours 02/08/22 0505     02/08/22 0500  piperacillin-tazobactam (ZOSYN) IVPB 3.375 g   Status:  Discontinued        3.375 g 12.5 mL/hr over 240 Minutes Intravenous Every 8 hours 02/08/22 0006 02/08/22 0505   02/08/22 0334  piperacillin-tazobactam (ZOSYN) 3.375 GM/50ML IVPB       Note to Pharmacy: Enis Gash W: cabinet override      02/08/22 0334 02/08/22 0345   02/07/22 2030  ceFEPIme (MAXIPIME) 2 g in sodium chloride 0.9 % 100 mL IVPB        2 g 200 mL/hr over 30 Minutes Intravenous  Once 02/07/22 2023 02/07/22 2248   02/07/22 2030  metroNIDAZOLE (FLAGYL) IVPB 500 mg        500 mg 100 mL/hr over 60 Minutes Intravenous  Once 02/07/22 2023 02/08/22 0506   02/07/22 2030  vancomycin (VANCOCIN) IVPB 1000 mg/200 mL premix        1,000 mg 200 mL/hr over 60 Minutes Intravenous  Once 02/07/22 2023 02/08/22 0506       Assessment/Plan  s/p Procedure(s): EXPLORATORY LAPAROTOMY, COLON RESECTION, OSTOMY 02/07/2022   FEN - NG tube, TPN, NPO ok for ice chips VTE - SCDs, lovenox ID - zosyn 11/24 -> 11/29 Disposition - ileus after major resection, path with transmural inflammation consistent with IBD   LOS: 6 days   I reviewed last 24 h vitals and pain scores, last  48 h intake and output, last 24 h labs and trends, and last 24 h imaging results.  This care required high  level of medical decision making.   [facility] 02/14/2022, 9:17 AM Please see Amion for pager number during day hours 7:00am-4:30pm or 7:00am -11:30am on weekends

## 2022-02-14 NOTE — Consult Note (Signed)
Renal Service Consult Note [facility]  Richard [name] 02/14/2022 Richard Blazing, [name] Requesting Physician: Dr. Pietro [name]  Reason for Consult: Acid-base problem  HPI: The patient is a 66 y.o. year-old w/ hx of HL, HTN, obesity, OSA, PNA, DM2, smoker who presented 02/07/22 w/ diarrhea for about 3-4 weeks. Colonscopy done outpatient 11/20 showed severe diffuse inflammation. Biopsy showed Crohn's and prednisone was started. Developed abd pain and presented to ED 11/23. In ED temp was 102 deg F, HR high and CT abd /pelvis showed free air and free fluid suggesting perforated bowel. IV abx and IVF's were started. On 11/24 gen surgery did exlap w/ descending and sigmoid colon resection w/ transverse colostomy. Pt has post-op ileus and high volume NG tube on suction. On 11/28 PICC placed for TPN. Since admission CO2 has increased from 19 ---> up to >45 today, along w/ hypokalemia, Cl 86, BUN 13 and creat 0.48.  AG wnl. Mg 2.0, K+ 2.2. Asked to see for metabolic alkalosis.   Pt seen in ICU. Wt's are down 109 --> 104 kg.  I/O neg since admission are 24.7 L in and 29.3 L out = net neg 4.5 L BP's are normal to slightly low, HR 100- 115 range, RR 18-24, temps hectic. Getting IV abx w/ zosyn.   Seen in room, pt a bit groggy, wife at bedside.   ROS - denies CP, no joint pain, no HA, no blurry vision, no rash, no diarrhea, no nausea/ vomiting, no dysuria, no difficulty voiding   Past Medical History  Past Medical History:  Diagnosis Date   Adult ADHD    Allergic rhinitis    Anxiety    claustrophobic   Colon polyps 2012 and 2013   All hyperplastic except one adenomatous w/out high grade dysplasia (recall 2023 per Dr. Deatra [name])   COVID-19 virus infection 09/29/2020   Elevated transaminase level 2017   Viral hep screening neg.  Suspect fatty liver.   Hyperlipidemia    Hypertension    Obesity    OSA on CPAP    Severe.  CPAP 10 cm H2O.  Follows Dr. Ander [name].   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 Marcello [name] to do surg   Pneumonia    Seborrheic dermatitis of scalp    and face: hydrocort 2% cream bid prn to face, and fluocinonide 0.5% Richard'n to scalp bid prn per Dr. Allyson [name] (04/2014)   Secondary male hypogonadism 04/2021   Type 2 diabetes mellitus with microalbuminuria ([place]) DM dx-09/27/2015. Microalb 2020   Past Surgical History  Past Surgical History:  Procedure Laterality Date   COLONOSCOPY W/ POLYPECTOMY  04/2010; 10/2011   Initial screening TCS showed 6 polyps (one adenomatous).  Pt says he returned for repeat TCS 10/2011 showed one hyperplastic polyp: Recall 10 yrs per Dr. Deatra [name]   INCISION AND DRAINAGE ABSCESS N/A 11/07/2021   Procedure: Incision and drainage of scrotal abscess;  Surgeon: Richard Ruff, [name];  Location: [facility];  Service: General;  Laterality: N/A;   LAPAROTOMY N/A 02/07/2022   Procedure: EXPLORATORY LAPAROTOMY, COLON RESECTION, OSTOMY;  Surgeon: Richard Ruff, [name];  Location: [facility];  Service: General;  Laterality: N/A;   MOUTH SURGERY     Family History  Family History  Problem Relation Age of Onset   Lung cancer Father    Cancer Father        nasal   Allergies Father    Colon cancer Neg Hx    Esophageal cancer Neg Hx    Rectal  cancer Neg Hx    Stomach cancer Neg Hx    Social History  reports that he quit smoking about 19 years ago. His smoking use included cigarettes. He smoked an average of 1 pack per day. He has never used smokeless tobacco. He reports current alcohol use of about 7.0 standard drinks of alcohol per week. He reports that he does not use drugs. Allergies  Allergies  Allergen Reactions   Lobster [Shellfish Allergy] Nausea Only   Poison Ivy Extract Itching   Home medications Prior to Admission medications   Medication Sig Start Date End Date Taking? Authorizing Provider  acetaminophen (TYLENOL) 500 MG tablet Take 1,000 mg by mouth every 6 (six) hours as needed for mild pain.   Yes Provider, Historical, [name]   atorvastatin (LIPITOR) 40 MG tablet TAKE 1 TABLET BY MOUTH EVERY DAY 02/01/22  Yes [name], Richard Blackwater, [name]  clotrimazole-betamethasone (LOTRISONE) cream Apply 1 Application topically 2 (two) times daily. 01/23/22  Yes [name], Richard Blackwater, [name]  diphenhydrAMINE (BENADRYL) 25 mg capsule Take 25 mg by mouth daily as needed for allergies.   Yes Provider, Historical, [name]  diphenoxylate-atropine (LOMOTIL) 2.5-0.025 MG tablet Take 1-2 tablets by mouth 4 (four) times daily as needed for diarrhea or loose stools. 1-2 tabs po qid prn diarrhea 01/12/22  Yes [name], Richard Lines, [name]  empagliflozin (JARDIANCE) 10 MG TABS tablet TAKE 1 TABLET BY MOUTH EVERY DAY BEFORE BREAKFAST Patient taking differently: Take 10 mg by mouth daily. 02/05/22  Yes [name], Richard Blackwater, [name]  lisinopril (ZESTRIL) 10 MG tablet TAKE 1 TABLET DAILY 01/10/22  Yes [name], Richard Blackwater, [name]  metFORMIN (GLUCOPHAGE) 500 MG tablet TAKE 1 TABLET BY MOUTH TWICE A DAY WITH FOOD 02/01/22  Yes [name], Richard Blackwater, [name]  methylphenidate (METADATE CD) 20 MG CR capsule Take 1 capsule (20 mg total) by mouth daily as needed. Patient taking differently: Take 20 mg by mouth daily as needed (for focuswhile at work). 01/16/22  Yes [name], Richard Blackwater, [name]  predniSONE (DELTASONE) 10 MG tablet Take 4 tablets (40 mg total) by mouth daily with breakfast for 14 days. After 14 days, then slow taper by '5mg'$  every 7 days. 02/04/22 02/18/22 Yes [name], Richard V, DO  Accu-Chek Softclix Lancets lancets SMARTSIG:Topical 01/19/20   Provider, Historical, [name]  glucose blood (ACCU-CHEK GUIDE) test strip USE TO TEST BLOOD SUGAR TWICE DAILY 07/12/21   [name], Richard Blackwater, [name]  SYRINGE-NEEDLE, DISP, 3 ML 22G X 1-1/2" 3 ML MISC USE TO ADMINISTER TESTOSTERONE INJECTION EVERY 2 WEEKS 06/06/21   [name], Richard Blackwater, [name]  Syringe/Needle, Disp, 18G X 1" 3 ML MISC USE TO ADMINISTER TESTOSTERONE INJECTION EVERY 2 WEEKS 06/06/21   [name], Richard Blackwater, [name]  testosterone cypionate (DEPOTESTOSTERONE CYPIONATE) 200 MG/ML injection  200 MG (1 ML) SQ Q 2 WEEKS Patient not taking: Reported on 02/08/2022 12/12/21   [name], [name]     Vitals:   02/14/22 0800 02/14/22 1000 02/14/22 1200 02/14/22 1400  BP: 127/63 (!) 129/57 131/64 132/64  Pulse: (!) 111 (!) 111 (!) 107 (!) 116  Resp: (!) 21 (!) 34 16 (!) 21  Temp: 99.5 F (37.5 C)  (!) 100.8 F (38.2 C) 99.4 F (37.4 C)  TempSrc: Axillary  Axillary Axillary  SpO2: 97% 96% 99% 99%  Weight:      Height:       Exam Gen alert, no distress ,NG tube No rash, cyanosis or gangrene Sclera anicteric, throat clear  No jvd or bruits Chest clear bilat to  bases, no rales/ wheezing RRR no MRG Abd soft ntnd no mass or ascites +bs GU defer MS no joint effusions or deformity Ext chronic skin changes c/w chronic LE edema, min 1+ pretib edema Neuro is alert, Ox 3 , nf     Assessment/ Plan: Metabolic alkalosis - related to heavy GI losses and particularly gastric secretions causing H+ loss and hypovolemia.  Hypovolemia causes abnormal renal HCO3 retention so repleting him to euvolemia is the way to fix this. Will need cc:cc replacement IVF's + another 100- 150 cc/hr since he is down several kg's since admission. Also recommend IV PPI to lower gastric H+ losses. Will follow.  Perforated colon - sp colectomy and colostomy Post op ileus - prolonged for several days, on TPN Hypokalemia - added KCL to his replacement fluids, also getting IV runs. Hypovolemia - on exam (chronic LE edema is minimal now), and by wts, thirst, I/O , etc.      Kelly Splinter  [name] 02/14/2022, 4:23 PM Recent Labs  Lab 02/13/22 0303 02/14/22 0500  HGB 10.2* 8.5*  ALBUMIN 1.7* 1.5*  CALCIUM 7.7* 7.7*  PHOS 2.7 1.9*  CREATININE 0.65 0.48*  K 2.9* 2.2*   Inpatient medications:  Chlorhexidine Gluconate Cloth  6 each Topical Daily   enoxaparin (LOVENOX) injection  0.5 mg/kg Subcutaneous Daily   insulin aspart  0-20 Units Subcutaneous Q4H   sodium chloride flush  10-40 mL Intracatheter Q12H     0.9 % NaCl with KCl 40 mEq / L 75 mL/hr at 02/14/22 1441   chlorproMAZINE (THORAZINE) 12.5 mg in sodium chloride 0.9 % 25 mL IVPB Stopped (02/14/22 0913)   piperacillin-tazobactam (ZOSYN)  IV 12.5 mL/hr at 02/14/22 1441   potassium PHOSPHATE IVPB (in mmol) 85 mL/hr at 02/14/22 1441   TPN ADULT (ION) 40 mL/hr at 02/14/22 1441   TPN ADULT (ION)     acetaminophen, chlorproMAZINE (THORAZINE) 12.5 mg in sodium chloride 0.9 % 25 mL IVPB, HYDROmorphone (DILAUDID) injection, ondansetron (ZOFRAN) IV, mouth rinse, sodium chloride flush

## 2022-02-15 ENCOUNTER — Inpatient Hospital Stay (HOSPITAL_COMMUNITY): Payer: 59

## 2022-02-15 DIAGNOSIS — K631 Perforation of intestine (nontraumatic): Secondary | ICD-10-CM

## 2022-02-15 DIAGNOSIS — R198 Other specified symptoms and signs involving the digestive system and abdomen: Secondary | ICD-10-CM | POA: Diagnosis not present

## 2022-02-15 HISTORY — DX: Perforation of intestine (nontraumatic): K63.1

## 2022-02-15 LAB — BASIC METABOLIC PANEL
Anion gap: 8 (ref 5–15)
Anion gap: 7 (ref 5–15)
BUN: 10 mg/dL (ref 8–23)
BUN: 10 mg/dL (ref 8–23)
CO2: 34 mmol/L — ABNORMAL HIGH (ref 22–32)
CO2: 30 mmol/L (ref 22–32)
Calcium: 7.3 mg/dL — ABNORMAL LOW (ref 8.9–10.3)
Calcium: 6.9 mg/dL — ABNORMAL LOW (ref 8.9–10.3)
Chloride: 94 mmol/L — ABNORMAL LOW (ref 98–111)
Chloride: 91 mmol/L — ABNORMAL LOW (ref 98–111)
Creatinine, Ser: 0.42 mg/dL — ABNORMAL LOW (ref 0.61–1.24)
Creatinine, Ser: 0.46 mg/dL — ABNORMAL LOW (ref 0.61–1.24)
GFR, Estimated: >60 mL/min (ref >60)
GFR, Estimated: >60 mL/min (ref >60)
Glucose, Bld: 164 mg/dL — ABNORMAL HIGH (ref 70–99)
Glucose, Bld: 190 mg/dL — ABNORMAL HIGH (ref 70–99)
Potassium: 3.8 mmol/L (ref 3.5–5.1)
Potassium: 3.7 mmol/L (ref 3.5–5.1)
Sodium: 136 mmol/L (ref 135–145)
Sodium: 128 mmol/L — ABNORMAL LOW (ref 135–145)

## 2022-02-15 LAB — GLUCOSE, CAPILLARY
Glucose-Capillary: 163 mg/dL — ABNORMAL HIGH (ref 70–99)
Glucose-Capillary: 140 mg/dL — ABNORMAL HIGH (ref 70–99)
Glucose-Capillary: 159 mg/dL — ABNORMAL HIGH (ref 70–99)
Glucose-Capillary: 172 mg/dL — ABNORMAL HIGH (ref 70–99)
Glucose-Capillary: 171 mg/dL — ABNORMAL HIGH (ref 70–99)
Glucose-Capillary: 156 mg/dL — ABNORMAL HIGH (ref 70–99)

## 2022-02-15 LAB — CBC WITH DIFFERENTIAL/PLATELET
Abs Immature Granulocytes: 0.07 10*3/uL (ref 0.00–0.07)
Basophils Absolute: 0 10*3/uL (ref 0.0–0.1)
Basophils Relative: 0 %
Eosinophils Absolute: 0.1 10*3/uL (ref 0.0–0.5)
Eosinophils Relative: 1 %
HCT: 25.8 % — ABNORMAL LOW (ref 39.0–52.0)
Hemoglobin: 8.2 g/dL — ABNORMAL LOW (ref 13.0–17.0)
Immature Granulocytes: 1 %
Lymphocytes Relative: 5 %
Lymphs Abs: 0.3 10*3/uL — ABNORMAL LOW (ref 0.7–4.0)
MCH: 33.3 pg (ref 26.0–34.0)
MCHC: 31.8 g/dL (ref 30.0–36.0)
MCV: 104.9 fL — ABNORMAL HIGH (ref 80.0–100.0)
Monocytes Absolute: 0.2 10*3/uL (ref 0.1–1.0)
Monocytes Relative: 3 %
Neutro Abs: 6.5 10*3/uL (ref 1.7–7.7)
Neutrophils Relative %: 90 %
Platelets: 207 10*3/uL (ref 150–400)
RBC: 2.46 MIL/uL — ABNORMAL LOW (ref 4.22–5.81)
RDW: 13.7 % (ref 11.5–15.5)
WBC: 7.2 10*3/uL (ref 4.0–10.5)
nRBC: 0 % (ref 0.0–0.2)

## 2022-02-15 LAB — PHOSPHORUS: Phosphorus: 1.8 mg/dL — ABNORMAL LOW (ref 2.5–4.6)

## 2022-02-15 LAB — MAGNESIUM: Magnesium: 1.6 mg/dL — ABNORMAL LOW (ref 1.7–2.4)

## 2022-02-15 LAB — ACID FAST SMEAR (AFB, MYCOBACTERIA): Acid Fast Smear: NEGATIVE

## 2022-02-15 MED ORDER — TRAVASOL 10 % IV SOLN
INTRAVENOUS | Status: AC
  Filled 2022-02-15: qty 708

## 2022-02-15 MED ORDER — SODIUM CHLORIDE 0.9 % IV SOLN
1.0000 g | Freq: Three times a day (TID) | INTRAVENOUS | Status: DC
  Administered 2022-02-15 – 2022-02-19 (×13): 1 g via INTRAVENOUS
  Filled 2022-02-15 (×14): qty 20

## 2022-02-15 MED ORDER — POTASSIUM PHOSPHATES 15 MMOLE/5ML IV SOLN
30.0000 mmol | Freq: Once | INTRAVENOUS | Status: AC
  Administered 2022-02-15: 30 mmol via INTRAVENOUS
  Filled 2022-02-15: qty 10

## 2022-02-15 MED ORDER — MAGNESIUM SULFATE 2 GM/50ML IV SOLN
2.0000 g | Freq: Once | INTRAVENOUS | Status: AC
  Administered 2022-02-15: 2 g via INTRAVENOUS
  Filled 2022-02-15: qty 50

## 2022-02-15 MED ORDER — VANCOMYCIN HCL IN DEXTROSE 1-5 GM/200ML-% IV SOLN
1000.0000 mg | Freq: Two times a day (BID) | INTRAVENOUS | Status: DC
  Administered 2022-02-15 – 2022-02-18 (×7): 1000 mg via INTRAVENOUS
  Filled 2022-02-15 (×7): qty 200

## 2022-02-15 MED ORDER — TRAVASOL 10 % IV SOLN: INTRAVENOUS | Status: DC

## 2022-02-15 MED ORDER — SODIUM CHLORIDE 0.9 % IV SOLN: INTRAVENOUS | Status: DC

## 2022-02-15 NOTE — Progress Notes (Signed)
Nutrition Follow-up  DOCUMENTATION CODES:   Severe malnutrition in context of acute illness/injury  INTERVENTION:  - Per pharmacy: TPN tonight at 22m/hr: will provide 70.8 (0.7g/kg), 180g CHO, 300 lipid calories = 1193 calories (11kcal/kg)  - TPN management per pharmacy.   - Monitor magnesium, potassium, and phosphorus BID for at least 3 days, MD to replete as needed, as pt is at risk for refeeding syndrome given malnutrition, significant weight loss of 10% in the past 4 months, inadequate intake for several weeks PTA.  - Due to risk of refeeding and unstable electrolytes, would consider increasing by only 200-300 calories each day if able to advance.  - Continue 1066mthiamine daily in TPN due to risk of refeeding risk.  - Monitor weights daily while on TPN.   NUTRITION DIAGNOSIS:   Severe Malnutrition related to acute illness (new dx Crohn's colitis; bowel perforation) as evidenced by moderate fat depletion, moderate muscle depletion, 10% percent weight loss in 4 months.  *ongoing  GOAL:   Patient will meet greater than or equal to 90% of their needs *not being met yet, pt advancing slowly towards goal TPN  MONITOR:   Labs, Weight trends, Diet advancement  REASON FOR ASSESSMENT:   Consult New TPN/TNA  ASSESSMENT:   [age]/o male with PMH HTN, HLD, DMII, diverticulosis, and newly diagnosed Crohn's colitis who developed diarrhea about 3 to 4 weeks prior to admission and presented 11/24 with worsening abdominal pain, found to have bowel perforation.  11/24 admit, S/p exploratory lap, mobilization of the splenic flexure, descending and sigmoid colon resection and transverse colostomy; NG placed 11/26 having bowel function from ostomy, NG removed; clear liquid diet             Later in day patient had N/V, NG reinserted and 2L bilious fluid removed 11/28 Failed NG clamp trial, PICC placed-plan to start TPN 11/29 TPN started 11/30 TPN continued at same rate due to  refeeding/electrolytes not stable  Current TPN at 4036mr: provides 57.6 (0.6g/kg), 144g dextrose, 240 lipid calories = 960 calories (9kcal/kg)  Patient remains NPO on TPN.  Labs reviewed. Electrolytes continue to be unstable. Potassium WNL this AM but phosphorus and magnesium both low. Per pharmacy, plan to increase slightly today and continue to monitor closely for ability to advance. Thiamine ordered.  Increase of 235 calories from today's TPN to tomorrow's ordered TPN.    Medications reviewed and include: Insulin  Labs reviewed:  Blood Glucose 170-200 x24 hours  Latest Reference Range & Units 02/15/22 05:59  Phosphorus 2.5 - 4.6 mg/dL 1.8 (L)  Magnesium 1.7 - 2.4 mg/dL 1.6 (L)  (L): Data is abnormally low   Diet Order:   Diet Order             Diet NPO time specified Except for: Other (See Comments)  Diet effective now                   EDUCATION NEEDS:  Education needs have been addressed  Skin:  Skin Assessment: Reviewed RN Assessment  Last BM:  11/30 - colostomy  Height:  Ht Readings from Last 1 Encounters:  02/08/22 _0  (1.753 m)   Weight:  Wt Readings from Last 1 Encounters:  02/13/22 104.5 kg    BMI:  Body mass index is 34.02 kg/m.  Estimated Nutritional Needs:  Kcal:  [id number] Protein:  135-155 grams Fluid:  >/= 2.3L    AspSamson [name], LDN For contact information, refer to [facility]

## 2022-02-15 NOTE — Progress Notes (Signed)
PHARMACY - TOTAL PARENTERAL NUTRITION CONSULT NOTE   Indication: Prolonged ileus  Patient Measurements: Height: '5\' 9"'$  (175.3 cm) Weight: 104.5 kg (230 lb 6.1 oz) IBW/kg (Calculated) : 70.7 TPN AdjBW (KG): 80.3 Body mass index is 34.02 kg/m. Usual Weight:   Assessment: 66 yo M s/p colonic perforation status post exploratory lap, partial colectomy and transverse colostomy 02/08/2022  Pharmacy consulted to manage TPN for prolonged ileus.  Failed NGT clamping trial 11/28.   PMHx Scrotal abscess August 2023, Colon polyp, Diverticulosis, ADHD, Anxiety, Rhinitis, COVID July 2022, HLD, HTN, OSA, PNA, Seborrheic dermatitis, Hypogonadism, DM type 2, Peripheral neuropathy, former smoker,    Glucose / Insulin: hx DM2 on metformin, Jardiance PTA Goal CBG < 180. CBGs all < 140 prior to TPN CBGs: 140-188 on TPN @ 40 ml/hr Q4h resistant SSI: 24 units/24 hrs Electrolytes: Na has trended down to low end of normal limits, K 3.8 after replacement, Phos low at 1.8, Mag low at 1.6, CoCa 9.7 Renal: SCr <1 Hepatic: LFT WNL, Trig 142, ALb 1.5 Intake / Output;I/O 2468 NGO: 2050/24 hrs. Stool out 0/24 hrs. UOP 850/24 hrs.   MIVF: NS at 69m/hr and 1/2NS w/KCl 434m/L at 1053mr GI Imaging:  11/28 CT A/P: post op ileus GI Surgeries / Procedures:  02/08/22:  colonic perforation status post exploratory lap, partial colectomy and transverse colostomy  Central access: PICC placed 11/28 for TPN TPN start date: 02/13/22  Nutritional Goals: Goal TPN rate is 100 mL/hr (provides 142 g of protein and 2390 kcals per day)  RD Assessment: 11/29 Estimated Needs Total Energy Estimated Needs: [id number] Total Protein Estimated Needs: 135-155 grams Total Fluid Estimated Needs: >/= 2.3L  Current Nutrition:  NPO  Plan:  This morning: Magnesium 2 g IV x 1 KPhos 30 mM IV x 1 (provides 44 meq K)   At 1800: Increase TPN slightly to 50 ml/hr (increase slowly until electrolytes stabilized) Electrolytes in TPN:  increase Na 70 mEq/L, increase K 65 mEq/L, Ca 5mE21m, increase Mg 6 mEq/L, and Phos 15mm71m. Cl:Ac 1:1 Add standard MVI and trace elements to TPN Add thiamine to TPN  continue Resistant q4h SSI and adjust as needed  MIVFL reduce NS to 65 ml/hr at 1800 and continue 1/2 NaCl with KCl 40 mEq/L at 10 ml/hr Monitor TPN labs on Mon/Thurs, BMET, Mg & Phos in AM ordered by MD   Thank you for allowing pharmacy to be a part of this patient's care.  MelisRoyetta AsalrmD, BCPS Clinical Pharmacist [place] Please utilize Amion for appropriate phone number to reach the unit pharmacist ([facility] Ph[facility]ark1/2023 9:06 AM

## 2022-02-15 NOTE — Progress Notes (Addendum)
[facility] Progress Note  CC:  New diagnosis of IBD, acute sigmoid perforation s/p exp laparoscopy, left colectomy and transverse colostomy  Subjective: He denies having any abdominal pain at this time.  He is tolerating ice chips.  His wife is at the bedside and verbalized concern regarding her husband's persistent abdominal distension.  She also noted he has intermittent confusion over the past 2 days.   Objective:   Colonoscopy 02/04/2022: -Preparation of the colon was fair, but adequate for the purposes of this study. - Anal fissure found on perianal exam. - Diffuse severe inflammation was found in the entire examined colon secondary to Crohn's disease with colonic involvement. Biopsied. - The examined portion of the ileum was normal. Biopsied 1. Surgical [P], small bowel, ileum bx - BENIGN SMALL BOWEL MUCOSA WITH NO SIGNIFICANT PATHOLOGIC CHANGES 2. Surgical [P], colon, cecum - CHRONIC ACTIVE COLITIS (SEE NOTE) - NEGATIVE FOR DYSPLASIA OR MALIGNANCY 3. Surgical [P], colon, ascending - CHRONIC ACTIVE COLITIS (SEE NOTE) - NEGATIVE FOR DYSPLASIA OR MALIGNANCY 4. Surgical [P], colon, transverse - CHRONIC ACTIVE COLITIS (SEE NOTE) - NEGATIVE FOR DYSPLASIA OR MALIGNANCY 5. Surgical [P], colon, descending - CHRONIC ACTIVE COLITIS (SEE NOTE) - NEGATIVE FOR DYSPLASIA OR MALIGNANCY 6. Surgical [P], colon, sigmoid - CHRONIC ACTIVE COLITIS (SEE NOTE) - NEGATIVE FOR DYSPLASIA OR MALIGNANCY 7. Surgical [P], colon, rectum - CHRONIC ACTIVE PROCTITIS (SEE NOTE) - NEGATIVE FOR DYSPLASIA OR MALIGNANCY   Vital signs in last 24 hours: Temp:  [99.1 F (37.3 C)-101.5 F (38.6 C)] 100 F (37.8 C) (12/01 0000) Pulse Rate:  [103-116] 107 (12/01 0400) Resp:  [15-34] 17 (12/01 0400) BP: (103-132)/(54-67) 128/57 (12/01 0400) SpO2:  [94 %-99 %] 96 % (12/01 0400) Last BM Date : 02/14/22 General: Ill-appearing 66 year old male in no acute distress. Heart: Tachycardic, no  murmurs. Pulm: Coarse breath sounds throughout, decreased in the bases.  On oxygen nasal cannula. Abdomen: Moderately distended abdomen, soft.  Nontender.  Faint scattered bowel sounds auscultated with NG tube briefly clamped.  NG tube with approximately 100 cc of green bilious drainage.  Colostomy stoma somewhat protrudes and is pale red in color with a small amount of brown nonbloody stool in the collection bag. Extremities: Mild lower extremity edema. Neurologic:  Alert and oriented x 4.  Speech is clear.  He answers questions appropriately.  Moves all extremities weakly. Psych:  Alert and cooperative. Normal mood and affect.  Intake/Output from previous day: 11/30 0701 - 12/01 0700 In: 6986.4 [P.O.:770; I.V.:4767.6; IV Piggyback:1448.8] Out: 2400 [Urine:1000; Emesis/NG output:1400] Intake/Output this shift: Total I/O In: 2792.6 [P.O.:220; I.V.:2206.9; IV Piggyback:365.7] Out: 1500 [Urine:600; Emesis/NG output:900]  Lab Results: Recent Labs    02/13/22 0303 02/14/22 0500  WBC 7.8 8.0  HGB 10.2* 8.5*  HCT 32.1* 26.4*  PLT 281 206   BMET Recent Labs    02/13/22 0303 02/14/22 0500 02/14/22 1715  NA 143 140 138  K 2.9* 2.2* 2.8*  CL 86* 86* 88*  CO2 43* >45* 43*  GLUCOSE 140* 188* 202*  BUN '14 13 11  '$ CREATININE 0.65 0.48* 0.48*  CALCIUM 7.7* 7.7* 7.6*   LFT Recent Labs    02/14/22 0500  PROT 5.6*  ALBUMIN 1.5*  AST 24  ALT 13  ALKPHOS 69  BILITOT 0.6   PT/INR No results for input(s): "LABPROT", "INR" in the last 72 hours. Hepatitis Panel No results for input(s): "HEPBSAG", "HCVAB", "HEPAIGM", "HEPBIGM" in the last 72 hours.  CT ABDOMEN PELVIS WO CONTRAST  Result  Date: 02/15/2022 CLINICAL DATA:  History of prior laparoscopy with colonic resection and colostomy, persistent abdominal pain and distension. EXAM: CT ABDOMEN AND PELVIS WITHOUT CONTRAST TECHNIQUE: Multidetector CT imaging of the abdomen and pelvis was performed following the standard protocol without  IV contrast. RADIATION DOSE REDUCTION: This exam was performed according to the departmental dose-optimization program which includes automated exposure control, adjustment of the mA and/or kV according to patient size and/or use of iterative reconstruction technique. COMPARISON:  02/12/2022 FINDINGS: Lower chest: Bibasilar consolidation is noted with small right-sided pleural effusion. Hepatobiliary: Liver is well visualized and within normal limits. Gallbladder demonstrates vicarious excretion of contrast material. Perihepatic fluid is noted similar to that seen on the prior exam. Pancreas: Unremarkable. No pancreatic ductal dilatation or surrounding inflammatory changes. Spleen: Normal in size without focal abnormality. Adrenals/Urinary Tract: Adrenal glands are within normal limits. Kidneys demonstrate no renal calculi or obstructive changes. The bladder is partially distended. Stomach/Bowel: Hartmann's pouch is again identified. Left-sided colostomy is seen. The more proximal colon is unremarkable. The appendix is within normal limits. Stomach is decompressed by a gastric catheter. Mildly dilated loops of small bowel are noted particularly within the right abdomen somewhat progressed when compared with the prior exam. No transition zone is identified. Vascular/Lymphatic: Aortic atherosclerosis. No enlarged abdominal or pelvic lymph nodes. Reproductive: Prostate is unremarkable. Other: Fluid is noted throughout the abdomen to that seen on the prior exam. This is most prominent along the right pericolic gutter and perihepatic regions although similar to the prior study. Small amount of free fluid and air is seen in the hip paddle renal fossa stable from the prior exam likely related to the prior surgery. No discrete abscess is noted. Musculoskeletal: No acute or significant osseous findings. IMPRESSION: Postsurgical changes as described. Free fluid is noted within the abdomen relatively stable in appearance from  the prior exam. No discrete abscess is noted at this time. Multiple mildly dilated loops of small bowel in the right mid abdomen slightly greater than that seen on the prior exam. This is again likely related to a postoperative ileus. Correlate with the physical exam. Right-sided pleural effusion and bibasilar consolidation. Electronically Signed   By: Inez Catalina M.D.   On: 02/15/2022 02:12    Assessment / Plan:  51) 66 year old male recently diagnosed with IBD (Crohn's colitis) per colonoscopy 02/04/2022 who was admitted to the hospital with worsening abdominal pain. CTAP 11/23 showed a perforated viscus/acute sigmoid perforation with sepsis s/p exp laparoscopy, left colectomy and transverse colostomy 02/08/2022. Path report showed benign colonic mucosa with focal transmural inflammation, edema and serositis consistent with perforation and 4 benign lymph nodes. CTAP 11/28 showed evidence of a post op ileus.  Patient with increasing abdominal pain and abdominal distention.Temperature 101F at 4 AM. On Zosyn IV. CTAP without contrast this morning showed free fluid in the abdomen which is stable when compared to prior CT, no evidence of an abscess and multiple mildly dilated loops of small bowel in the right mid abdomen slightly greater then seen on prior CT likely related to postoperative ileus.  Right-sided pleural effusion and bibasilar consolidation was also noted. -Continue Zosyn IV -IV fluids and pain management per the hospitalist -Crohn's colitis treatment deferred until he recovers from his expiratory laparoscopy/left colectomy/colostomy surgery -General surgery following  -Await further recommendations per Dr. Lyndel Safe  2) Post op ileus. CTAP 02/12/2022 showed mildly dilated small bowel loops with scattered air fluid levels.  Repeat CTAP this morning showed multiple mildly dilated loops  of small bowel in the right mid abdomen slighter greater then seen on prior exam.  NGT to LIS. On TPN.  -Keep K+ >  4, Magnesium > 2  -Document NG tube output every shift -BMP, phos and magnesium level in am  3) Macrocytic anemia. Hg 10.2 -> 8.5 -> today Hg 8.2. MCV 104.9.  No overt GI bleeding. -CBC, iron panel and B12 level in am  4) Metabolic alkalosis secondary to large output from NGT and hypovolemia. On PPI and IV fluids.  -Nephrology following  5) Hypokalemia, hypophosphatemia -Management per the hospitalist  6) Indeterminate Quantiferon Gold x 2. AFB x 3 negative.   7) Right-sided pleural effusion and bibasilar consolidation.  On oxygen 6 L nasal cannula.  8) DM II    Principal Problem:   Perforated abdominal viscus Active Problems:   Protein-calorie malnutrition, severe     LOS: 7 days   Noralyn Pick  02/15/2022, 09:23AM   Attending physician's note   I have taken history, reviewed the chart and examined the patient. I performed a substantive portion of this encounter, including complete performance of at least one of the key components, in conjunction with the APP. I agree with the Advanced Practitioner's note, impression and recommendations.   CT AP neg for any intra-abdominal abscess.  Mild ileus-appears to be resolving BC-pending No active GI bleeding. Ostomy does show deep serpentine ulcers c/w active Crohn's disease ?Post-op pneumonia vs atelectasis.   Plan: -Likely clamp NG tube in AM and resume clear liquid diet. -Continue TPN for now. -Keep K>4, Mg>2 -Trend CBC, electrolytes. -Continue IV Mero/vanc. -Follow blood cultures -Hold off on steroids until infectious WU is complete and neg. May start early next week, if OK with Sx. -Encouraged incentive spirometry. -Do plan to start expedited Remicade in 4-6 weeks, if OK with surgery. -FU with Dr [name] as outpt. He is aware and is following him closely. -Dr. Benson Norway covering this weekend.  Will follow peripherally over the weekend. Dr. Carlean Purl taking over the svc Monday. Pl call if any ?/problems over  the wkend.   Carmell Austria, MD [facility] [phone]

## 2022-02-15 NOTE — Progress Notes (Signed)
Physical Therapy Treatment Patient Details Name: Richard [name] MRN: [id number] DOB: [birth year] Today's Date: 02/15/2022   History of Present Illness Patient is a 66 year old male who was admitted on 11/23 with bowel preforation. patient underwent exploratogy lap, sigmoid resection and transverse colostomy. PMH: IBD, crohn's colitis,COVID July 2022, HLD, HTN, OSA, PNA, Seborrheic dermatitis, Hypogonadism, DM type 2, and peripheral neuopathy.    PT Comments    Pt received supine in bed, very sleepy but agreeable to mobilize, family member present. Reviewed Logroll prior to mobility and pt could not recall technique. Vitals prior to mobility: SpO2 92% on 5LO2 via Orient, RR20, HR100, BP 116/59. Required max assist +2 for bed mobility to EOB and then max assist +2 for sit to stand transfer x3, pt unable to complete step pivot transfer safely so discontinued. Pt reporting constant low-level dizziness throughout. Vitals during mobility: HR105, SpO2 92%, RR19, BP108/58. Pt required total assist to return to bed and to adjust supine positioning via bed pad. Educated pt on splinting of abdomen during mobility/coughing/sneezing/laughing, pt verbalized understanding but did not demonstrate learning during session. Pt reporting feeling generally very weak and deconditioned. Vitals at end of session: BP 103, SpO291%, RR19, BP118/61. Pt and wife very appreciative of visit and motivated to continue. Discharge destination remains appropriate. We will continue to follow acutely.    Recommendations for follow up therapy are one component of a multi-disciplinary discharge planning process, led by the attending physician.  Recommendations may be updated based on patient status, additional functional criteria and insurance authorization.  Follow Up Recommendations  Home health PT     Assistance Recommended at Discharge Frequent or constant Supervision/Assistance  Patient can return home with the following Two people to  help with walking and/or transfers;Assistance with cooking/housework;Assist for transportation;Help with stairs or ramp for entrance;Two people to help with bathing/dressing/bathroom   Equipment Recommendations  Rolling walker (2 wheels)    Recommendations for Other Services OT consult     Precautions / Restrictions Precautions Precautions: Other (comment) Precaution Comments: NG suction, L colostomy,needs O2,ABD Binder Required Braces or Orthoses: Other Brace Other Brace: abdominal binder Restrictions Weight Bearing Restrictions: No     Mobility  Bed Mobility Overal bed mobility: Needs Assistance Bed Mobility: Rolling, Sidelying to Sit, Sit to Sidelying, Sit to Supine Rolling: Max assist, +2 for physical assistance, +2 for safety/equipment Sidelying to sit: Max assist, HOB elevated, +2 for safety/equipment, +2 for physical assistance   Sit to supine: Total assist, +2 for physical assistance, +2 for safety/equipment   General bed mobility comments: Re-educated pt on logrolling, pt required max assist to assume logrolling positioning, max assist +2 for rolling to side and for trunk elevation. Sitting EOB pt complaining of dizziness that did not change. Required total assist to return to supine positioning and for repositioning in supine in bed.    Transfers Overall transfer level: Needs assistance Equipment used: Rolling walker (2 wheels) Transfers: Sit to/from Stand Sit to Stand: +2 safety/equipment, From elevated surface, +2 physical assistance, Max assist           General transfer comment: Pt attempted sit-to-stand transfer x3 with decreased ability to mobilize under his own power, near total assist by repetition three, reporting weakness but motivated to try. Furhter mobility deferred, returned to supine position with total assist.    Ambulation/Gait               General Gait Details: deferred   Stairs  Wheelchair Mobility    Modified  Rankin (Stroke Patients Only)       Balance Overall balance assessment: Needs assistance Sitting-balance support: Bilateral upper extremity supported, Feet supported Sitting balance-Leahy Scale: Fair     Standing balance support: Bilateral upper extremity supported, During functional activity, Reliant on assistive device for balance Standing balance-Leahy Scale: Zero Standing balance comment: unable                            Cognition Arousal/Alertness: Awake/alert Behavior During Therapy: WFL for tasks assessed/performed Overall Cognitive Status: Within Functional Limits for tasks assessed                                          Exercises Total Joint Exercises Ankle Circles/Pumps: AROM, Both, 10 reps    General Comments        Pertinent Vitals/Pain Pain Assessment Pain Assessment: 0-10 Pain Score: 3  Faces Pain Scale: Hurts whole lot Pain Location: abdomen when rolling and sitting up EOB Pain Descriptors / Indicators: Grimacing, Guarding, Discomfort Pain Intervention(s): Limited activity within patient's tolerance, Monitored during session, Repositioned    Home Living                          Prior Function            PT Goals (current goals can now be found in the care plan section) Acute Rehab PT Goals Patient Stated Goal: to go outside, get out of room. PT Goal Formulation: With patient/family Time For Goal Achievement: 02/25/22 Potential to Achieve Goals: Good Progress towards PT goals: Progressing toward goals    Frequency    Min 3X/week      PT Plan Current plan remains appropriate    Co-evaluation              AM-PAC PT "6 Clicks" Mobility   Outcome Measure  Help needed turning from your back to your side while in a flat bed without using bedrails?: Total Help needed moving from lying on your back to sitting on the side of a flat bed without using bedrails?: Total Help needed moving to and  from a bed to a chair (including a wheelchair)?: Total Help needed standing up from a chair using your arms (e.g., wheelchair or bedside chair)?: Total Help needed to walk in hospital room?: Total Help needed climbing 3-5 steps with a railing? : Total 6 Click Score: 6    End of Session Equipment Utilized During Treatment: Oxygen;Gait belt (ab binder) Activity Tolerance: Patient limited by fatigue Patient left: in bed;with call bell/phone within reach;with bed alarm set;with family/visitor present;with SCD's reapplied Nurse Communication: Mobility status PT Visit Diagnosis: Difficulty in walking, not elsewhere classified (R26.2);Pain Pain - part of body:  (abdomen)     Time: 4656-8127 PT Time Calculation (min) (ACUTE ONLY): 37 min  Charges:  $Therapeutic Activity: 23-37 mins                     Coolidge Breeze, PT, DPT [facility] Department Office: [phone] Weekend pager: [phone]   Coolidge Breeze 02/15/2022, 4:00 PM

## 2022-02-15 NOTE — Progress Notes (Signed)
PROGRESS NOTE  Richard [name]  DOB: 04-15-1955  PCP: [name], MD FFM:[id number]  DOA: 02/07/2022  LOS: 7 days  Hospital Day: 9  Brief narrative: Richard [name] is a 66 y.o. male with PMH significant for DM2, HTN, HLD, obesity, OSA, hypogonadism, peripheral neuropathy.  11/7, seen in the office by Dr. [name] with complaint of diarrhea for about 3 to 4 months, 6-8 episodes a day, mixed with blood associated with abdominal cramping and progressive weakness.  Stool studies at that time were negative for infectious etiology.  Sed rate was elevated 72, fecal calprotectin was elevated to 3570. 11/20, underwent colonoscopy, found to have diffuse colitis with deep ulcerations throughout the entire colon friable mucosa as well as extensive mucosal edema in the rectum.  He was started on prednisone 40 mg daily. After colonoscopy, pat ient developed worsening abdominal pain. 11/23, at home patient had 1 episode of syncope.  Also had a fever.  Abdominal pain worsened and hence came to the ED. In the ED he had fever of 102.1 with tachycardia. CT abdomen pelvis showed perforated bowel with moderate free fluid and free air.  Patient was started on IV antibiotics, IV fluid Patient was admitted to ICU. General surgery was consulted 11/24, patient underwent exploratory laparotomy, descending and sigmoid colon resection, transverse colostomy by Dr. [name] See below for details.  Subjective: Patient was seen and examined this morning.  Lying on bed.  Weak.  NG tube attached to suction.  History significant output. Had spikes of fever in last 24 hours, Tmax 101.5 yesterday afternoon.  Remains tachycardic to 100s.  On 5 L oxygen by nasal cannula Wife at bedside  Assessment and plan: Acute peritonitis d/t perforated sigmoid colon Inflammatory bowel disease S/p exploratory laparotomy, descending and sigmoid colon resection, transverse colostomy - 11/24 Sepsis - POA Being followed by GI,  general surgery. Blood culture 11/27 with no growth. WBC count normal, but patient is spiking fever. Tmax 101.5 yesterday afternoon.    Repeat blood cultures sent today. Discussed with general surgery.  Will broaden antibiotic coverage to IV meropenem and IV vancomycin surgical pathology report showed transmural inflammation consistent with IBD Pain management per general surgery.  Currently on as needed IV Dilaudid. Recent Labs  Lab 02/11/22 0249 02/12/22 0300 02/13/22 0303 02/14/22 0500 02/15/22 0559  WBC 10.9* 8.8 7.8 8.0 7.2   Postop ileus 11/28, CT abdomen showed mild dilated small bowel loops with scattered air-fluid levels likely postop ileus.  It also showed scattered free fluid in the abdomen pelvis and small amount of free air and free fluid in the hepatorenal fossa without discrete drainable abscess. Continues to have high volume NG tube on suction.  General surgery following. 11/28, PICC line placed  11/29, started TPN  Acute metabolic alkalosis 57/01, blood gas showed significant alkalosis with pH 7.6, pCO2 59, serum bicarb 58 This is likely due to gastric secretions causing loss of acid as well as hypovolemia.   Nephrology consult appreciated.  Started on PPI Aggressively hydrated. Continue fluid and electrolyte management per nephrology recommendation.  Postop respiratory insufficiency Currently on 6L oxygen nasal cannula. 11/28, CT scan showed small bilateral pleural effusion and moderate overlying atelectasis. Incentive spirometry, flutter valve, ambulation as tolerated Continue to wean down as tolerated  At Risk of AKI  In setting of n.p.o. status, high output from NG tube third spacing, contrast administration. Currently on IV hydration and TPN So far renal function is normal.  Continue to monitor. Recent Labs  02/07/22 2031 02/08/22 0726 02/09/22 0246 02/10/22 0252 02/11/22 0249 02/12/22 0300 02/13/22 0303 02/14/22 0500 02/14/22 1715  02/15/22 0559  BUN '15 17 22 22 15 16 14 13 11 10  '$ CREATININE 0.50* 0.65 0.68 0.60* 0.60* 0.67 0.65 0.48* 0.48* 0.42*   Hypokalemia/hypomagnesemia/hypophosphatemia Electrolyte trend as below.  Running low because of loss of gastric secretions, refeeding syndrome from TPN.   Pharmacy nephrology following.  Replacement given. Recent Labs  Lab 02/11/22 0249 02/12/22 0300 02/13/22 0303 02/14/22 0500 02/14/22 1715 02/15/22 0559  K 2.9* 2.6* 2.9* 2.2* 2.8* 3.8  MG 2.0 2.0 1.9 2.0  --  1.6*  PHOS 4.1 3.1 2.7 1.9*  --  1.8*   Type 2 diabetes mellitus A1c 6.6 on 01/23/2022 PTA on metformin, Jardiance Currently on sliding scale insulin with Accu-Cheks every 4 hours.  May need long-acting insulin on TPN.  Continue to monitor Recent Labs  Lab 02/14/22 1951 02/14/22 2345 02/15/22 0516 02/15/22 0847 02/15/22 1210  GLUCAP 170* 160* 163* 140* 159*   Essential HTN lisinopril remains on hold   HLD Lipitor remains on hold  Obesity -Body mass index is 34.02 kg/m. Patient has been advised to make an attempt to improve diet and exercise patterns to aid in weight loss.  OSA Baseline CPAP 10 cm H20, followed by Dr. Ander Slade CPAP QSH   NGT may impede mask seal    Hx of ADHD, Anxiety methylphenidate on hold   Hx of Hypogonadism. Was on outpatient testosterone    Moderate Protein Calorie Malnutrition nutrition when ok per CCS   Goals of care   Code Status: Full Code    Mobility: PT follow-up needed  Infusions:   sodium chloride     chlorproMAZINE (THORAZINE) 12.5 mg in sodium chloride 0.9 % 25 mL IVPB Stopped (02/14/22 0913)   meropenem (MERREM) IV     potassium PHOSPHATE IVPB (in mmol) 85 mL/hr at 02/15/22 1100   sodium chloride 0.45 % 1,000 mL with potassium chloride 40 mEq infusion 38 mL/hr at 02/15/22 1100   TPN ADULT (ION) 40 mL/hr at 02/14/22 1751   TPN ADULT (ION)     vancomycin 1,000 mg (02/15/22 1321)    Scheduled Meds:  Chlorhexidine Gluconate Cloth  6 each  Topical Daily   enoxaparin (LOVENOX) injection  0.5 mg/kg Subcutaneous Daily   insulin aspart  0-20 Units Subcutaneous Q4H   pantoprazole (PROTONIX) IV  40 mg Intravenous Q24H   sodium chloride flush  10-40 mL Intracatheter Q12H    PRN meds: acetaminophen, chlorproMAZINE (THORAZINE) 12.5 mg in sodium chloride 0.9 % 25 mL IVPB, HYDROmorphone (DILAUDID) injection, ondansetron (ZOFRAN) IV, mouth rinse, sodium chloride flush   Skin assessment:     Nutritional status:  Body mass index is 34.02 kg/m.  Nutrition Problem: Severe Malnutrition Etiology: acute illness (new dx Crohn's colitis; bowel perforation) Signs/Symptoms: moderate fat depletion, moderate muscle depletion, percent weight loss Percent weight loss: 10 % (in 4 months)     Diet:  Diet Order             Diet NPO time specified Except for: Other (See Comments)  Diet effective now                   DVT prophylaxis:  Place and maintain sequential compression device Start: 02/08/22 0001   Antimicrobials: IV meropenem and IV vancomycin Fluid: TPN per general surgery.  Fluid per nephrology Consultants: General surgery, nephrology Family Communication: Wife at bedside  Status is: Inpatient  Continue in-hospital care  because: Postop ileus, requiring NG tube decompression. Level of care: ICU   Dispo: The patient is from: Home              Anticipated d/c is to: Pending clinical course              Patient currently is not medically stable to d/c.   Difficult to place patient No       Antimicrobials: Anti-infectives (From admission, onward)    Start     Dose/Rate Route Frequency Ordered Stop   02/15/22 1400  meropenem (MERREM) 1 g in sodium chloride 0.9 % 100 mL IVPB        1 g 200 mL/hr over 30 Minutes Intravenous Every 8 hours 02/15/22 1244     02/15/22 1400  vancomycin (VANCOCIN) IVPB 1000 mg/200 mL premix        1,000 mg 200 mL/hr over 60 Minutes Intravenous Every 12 hours 02/15/22 1304      02/08/22 1200  piperacillin-tazobactam (ZOSYN) IVPB 3.375 g  Status:  Discontinued        3.375 g 12.5 mL/hr over 240 Minutes Intravenous Every 8 hours 02/08/22 0505 02/15/22 1243   02/08/22 0500  piperacillin-tazobactam (ZOSYN) IVPB 3.375 g  Status:  Discontinued        3.375 g 12.5 mL/hr over 240 Minutes Intravenous Every 8 hours 02/08/22 0006 02/08/22 0505   02/08/22 0334  piperacillin-tazobactam (ZOSYN) 3.375 GM/50ML IVPB       Note to Pharmacy: Enis Gash W: cabinet override      02/08/22 0334 02/08/22 0345   02/07/22 2030  ceFEPIme (MAXIPIME) 2 g in sodium chloride 0.9 % 100 mL IVPB        2 g 200 mL/hr over 30 Minutes Intravenous  Once 02/07/22 2023 02/07/22 2248   02/07/22 2030  metroNIDAZOLE (FLAGYL) IVPB 500 mg        500 mg 100 mL/hr over 60 Minutes Intravenous  Once 02/07/22 2023 02/08/22 0506   02/07/22 2030  vancomycin (VANCOCIN) IVPB 1000 mg/200 mL premix        1,000 mg 200 mL/hr over 60 Minutes Intravenous  Once 02/07/22 2023 02/08/22 0506       Objective: Vitals:   02/15/22 1057 02/15/22 1100  BP: 117/67 113/61  Pulse: (!) 108 (!) 108  Resp: (!) 21 20  Temp:    SpO2: 96% 96%    Intake/Output Summary (Last 24 hours) at 02/15/2022 1348 Last data filed at 02/15/2022 1337 Gross per 24 hour  Intake 8215.38 ml  Output 2575 ml  Net 5640.38 ml   Filed Weights   02/08/22 0500 02/12/22 1801 02/13/22 0436  Weight: 109.1 kg 105 kg 104.5 kg   Weight change:  Body mass index is 34.02 kg/m.   Physical Exam: General exam: Pleasant elderly Caucasian male.  Remains weak, lethargic Skin: No rashes, lesions or ulcers. HEENT: Atraumatic, normocephalic, no obvious bleeding.  NG tube attached to suction with significant bilious output Lungs: Diminished air entry in both bases CVS: Regular rate and rhythm, no murmur GI/Abd soft, mild postop appropriate tenderness, bowel sounds minimal.  Ostomy bag with minimal liquid brown stool CNS: Drowsy, under the influence of  pain medicine.  Psychiatry: Sad affect Extremities: No pedal edema, no calf tenderness  Data Review: I have personally reviewed the laboratory data and studies available.  F/u labs ordered Unresulted Labs (From admission, onward)     Start     Ordered   02/15/22 1303  MRSA Next Gen  by PCR, Nasal  (MRSA Screening)  Once,   R        02/15/22 1302   02/15/22 1234  Expectorated Sputum Assessment w Gram Stain, Rflx to Resp Cult  Once,   R        02/15/22 1233   02/15/22 7078  Basic metabolic panel  2 times daily,   R     Question:  Specimen collection method  Answer:  Unit=Unit collect   02/14/22 1704   02/15/22 0743  Culture, blood (Routine X 2) w Reflex to ID Panel  BLOOD CULTURE X 2,   R      02/15/22 0742   02/14/22 0500  Triglycerides  (TPN Lab Panel)  Every Mon,Thu (0500),   R     Question:  Specimen collection method  Answer:  Lab=Lab collect   02/12/22 1801   02/13/22 0500  CBC with Differential/Platelet  Daily at 5am,   R     Question:  Specimen collection method  Answer:  Lab=Lab collect   02/12/22 0927   02/13/22 0500  Magnesium  Daily at 5am,   R     Question:  Specimen collection method  Answer:  Lab=Lab collect   02/12/22 0927   02/13/22 0500  Phosphorus  Daily at 5am,   R     Question:  Specimen collection method  Answer:  Lab=Lab collect   02/12/22 0927   02/11/22 1800  Acid Fast Smear (AFB)  (AFB smear + Culture w reflexed sensitivities panel)  3 times daily,   R,   Status:  Canceled     See Hyperspace for full Linked Orders Report.   02/11/22 1544   02/11/22 1800  Acid Fast Culture with reflexed sensitivities  (AFB smear + Culture w reflexed sensitivities panel)  3 times daily,   R,   Status:  Canceled     See Hyperspace for full Linked Orders Report.   02/11/22 1544            Signed, Terrilee Croak, MD [facility] 02/15/2022

## 2022-02-15 NOTE — Progress Notes (Signed)
[facility] Progress Note  Subjective: seen in room  Vitals:   02/15/22 0800 02/15/22 0847 02/15/22 1057 02/15/22 1100  BP:   117/67 113/61  Pulse: (!) 107  (!) 108 (!) 108  Resp: 19  (!) 21 20  Temp:  100.3 F (37.9 C)    TempSrc:  Oral    SpO2: 98%  96% 96%  Weight:      Height:        Exam: Gen lethargic but responsive, NG tube No jvd or bruits Chest clear bilat to bases RRR no MRG Abd soft ntnd no mass or ascites +bs Ext chronic skin changes w/ minimal pretib LE edema Neuro is nonfocal, as above     Assessment/ Plan: Metabolic alkalosis - related to heavy GI losses of particularly gastric secretions causing H+ loss and hypovolemia.  Started cc:cc replacement IVF's and serum CO2 down to 34 today. Correcting hypovolemia is key part of resolving alkalosis. Started IV PPI to lower gastric fluid H+ content. Will cont cc:cc replacement for now and since he is getting TPN 85 + 40 cc/hr, will lower the additional fluids to NS at 40 cc/hr. Will follow.   Perforated colon - sp colectomy and colostomy Post op ileus - prolonged for several days, on TPN Hypokalemia - K+ better mid 3s today. Cont regimen.  Hypovolemia - on exam (chronic LE edema is minimal now), and by wts, thirst, I/O , etc.       Rob Jonnie Finner 02/15/2022, 12:47 PM   Recent Labs  Lab 02/13/22 0303 02/14/22 0500 02/14/22 1715 02/15/22 0559  HGB 10.2* 8.5*  --  8.2*  ALBUMIN 1.7* 1.5*  --   --   CALCIUM 7.7* 7.7* 7.6* 7.3*  PHOS 2.7 1.9*  --  1.8*  CREATININE 0.65 0.48* 0.48* 0.42*  K 2.9* 2.2* 2.8* 3.8   No results for input(s): "IRON", "TIBC", "FERRITIN" in the last 168 hours. Inpatient medications:  Chlorhexidine Gluconate Cloth  6 each Topical Daily   enoxaparin (LOVENOX) injection  0.5 mg/kg Subcutaneous Daily   insulin aspart  0-20 Units Subcutaneous Q4H   pantoprazole (PROTONIX) IV  40 mg Intravenous Q24H   sodium chloride flush  10-40 mL Intracatheter Q12H    sodium chloride 75  mL/hr at 02/15/22 1100   sodium chloride     chlorproMAZINE (THORAZINE) 12.5 mg in sodium chloride 0.9 % 25 mL IVPB Stopped (02/14/22 0913)   meropenem (MERREM) IV     potassium PHOSPHATE IVPB (in mmol) 85 mL/hr at 02/15/22 1100   sodium chloride 0.45 % 1,000 mL with potassium chloride 40 mEq infusion 38 mL/hr at 02/15/22 1100   TPN ADULT (ION) 40 mL/hr at 02/14/22 1751   TPN ADULT (ION)     acetaminophen, chlorproMAZINE (THORAZINE) 12.5 mg in sodium chloride 0.9 % 25 mL IVPB, HYDROmorphone (DILAUDID) injection, ondansetron (ZOFRAN) IV, mouth rinse, sodium chloride flush

## 2022-02-15 NOTE — Progress Notes (Addendum)
Pharmacy Antibiotic Note  Richard [name] is a 66 y.o. male admitted on 02/07/2022 with sepsis.  New diagnosis of IBD, acute sigmoid perforation s/p exp laparoscopy, left colectomy and transverse colostomy.  Has been on Zosyn since 11/24 but today has ongoing fever, lower grade tachycardia, and distention that per RN and wife seems worse.  Attending (who discussed with surgery) has consulted Pharmacy to switch antibiotics to meropenem and vancomycin for acute peritonitis d/t perforated sigmoid colon, IBD, and possible developing pneumonia.    Plan: Meropenem 1 g IV every 8 hours Vancomycin 1 g IV every 12 hours (Goal AUC 400-550, calculated AUC 473.7, SCr used: rounded up to 0.8) Monitor clinical progress, renal function, vancomycin levels as indicated F/U repeat MRSA PCR, C&S, abx deescalation / LOT    Height: '5\' 9"'$  (175.3 cm) Weight: 104.5 kg (230 lb 6.1 oz) IBW/kg (Calculated) : 70.7  Temp (24hrs), Avg:100.2 F (37.9 C), Min:99.1 F (37.3 C), Max:101.5 F (38.6 C)  Recent Labs  Lab 02/11/22 0249 02/12/22 0300 02/13/22 0303 02/14/22 0500 02/14/22 1715 02/15/22 0559  WBC 10.9* 8.8 7.8 8.0  --  7.2  CREATININE 0.60* 0.67 0.65 0.48* 0.48* 0.42*    Estimated Creatinine Clearance: 109.6 mL/min (A) (by C-G formula based on SCr of 0.42 mg/dL (L)).    Allergies  Allergen Reactions   Lobster [Shellfish Allergy] Nausea Only   Poison Ivy Extract Itching    Antimicrobials this admission: 11/23 Flagyl, cefepime, vancomycin x 1 11/24 Zosyn >> 12/1 12/1 meropenem >> 12/1 vancomycin >>  Microbiology results: 12/1 BCx: sent 11/30 AFB: sent 11/27 Bcx: ngx4 11/25 UCx: ngf  11/24 MRSA PCR: not detected 11/23 Bcx: ngx5   Thank you for allowing pharmacy to be a part of this patient's care.  Royetta Asal, PharmD, BCPS Clinical Pharmacist [place] Please utilize Amion for appropriate phone number to reach the unit pharmacist ([place]) 02/15/2022 1:07 PM

## 2022-02-15 NOTE — Progress Notes (Addendum)
8 Days Post-Op   Chief Complaint/Subjective: Ongoing fever, lower grade tachycardia. Per RN and per wife his distention seems worse. Wife expresses concern about his confusion.  Pt and wife state he is not using IS.  Objective: Vital signs in last 24 hours: Temp:  [99.1 F (37.3 C)-101.5 F (38.6 C)] 100.3 F (37.9 C) (12/01 0847) Pulse Rate:  [103-116] 108 (12/01 1100) Resp:  [16-23] 20 (12/01 1100) BP: (103-132)/(54-67) 113/61 (12/01 1100) SpO2:  [94 %-99 %] 96 % (12/01 1100) Last BM Date : 02/14/22 Intake/Output from previous day: 11/30 0701 - 12/01 0700 In: 6986.4 [P.O.:770; I.V.:4767.6; IV Piggyback:1448.8] Out: 2400 [Urine:1000; Emesis/NG output:1400]  PE: Gen: NAD Resp: nonlabored on [place], increased upper airway secretions and some rhonchi. Card: tachycardic Abd: ostomy pink with small amount of output - brown/pink, stable moderate distension, wound with clean base and fascia I ntact  Lab Results:  Recent Labs    02/14/22 0500 02/15/22 0559  WBC 8.0 7.2  HGB 8.5* 8.2*  HCT 26.4* 25.8*  PLT 206 207   Recent Labs    02/14/22 1715 02/15/22 0559  NA 138 136  K 2.8* 3.8  CL 88* 94*  CO2 43* 34*  GLUCOSE 202* 164*  BUN 11 10  CREATININE 0.48* 0.42*  CALCIUM 7.6* 7.3*   No results for input(s): "LABPROT", "INR" in the last 72 hours.    Component Value Date/Time   NA 136 02/15/2022 0559   K 3.8 02/15/2022 0559   CL 94 (L) 02/15/2022 0559   CO2 34 (H) 02/15/2022 0559   GLUCOSE 164 (H) 02/15/2022 0559   BUN 10 02/15/2022 0559   CREATININE 0.42 (L) 02/15/2022 0559   CREATININE 0.63 (L) 03/10/2019 1436   CALCIUM 7.3 (L) 02/15/2022 0559   PROT 5.6 (L) 02/14/2022 0500   ALBUMIN 1.5 (L) 02/14/2022 0500   AST 24 02/14/2022 0500   ALT 13 02/14/2022 0500   ALKPHOS 69 02/14/2022 0500   BILITOT 0.6 02/14/2022 0500   GFRNONAA >60 02/15/2022 0559    Anti-infectives: Anti-infectives (From admission, onward)    Start     Dose/Rate Route Frequency Ordered Stop    02/08/22 1200  piperacillin-tazobactam (ZOSYN) IVPB 3.375 g        3.375 g 12.5 mL/hr over 240 Minutes Intravenous Every 8 hours 02/08/22 0505     02/08/22 0500  piperacillin-tazobactam (ZOSYN) IVPB 3.375 g  Status:  Discontinued        3.375 g 12.5 mL/hr over 240 Minutes Intravenous Every 8 hours 02/08/22 0006 02/08/22 0505   02/08/22 0334  piperacillin-tazobactam (ZOSYN) 3.375 GM/50ML IVPB       Note to Pharmacy: Enis Gash W: cabinet override      02/08/22 0334 02/08/22 0345   02/07/22 2030  ceFEPIme (MAXIPIME) 2 g in sodium chloride 0.9 % 100 mL IVPB        2 g 200 mL/hr over 30 Minutes Intravenous  Once 02/07/22 2023 02/07/22 2248   02/07/22 2030  metroNIDAZOLE (FLAGYL) IVPB 500 mg        500 mg 100 mL/hr over 60 Minutes Intravenous  Once 02/07/22 2023 02/08/22 0506   02/07/22 2030  vancomycin (VANCOCIN) IVPB 1000 mg/200 mL premix        1,000 mg 200 mL/hr over 60 Minutes Intravenous  Once 02/07/22 2023 02/08/22 0506       Assessment/Plan Perforated sigmoid colon  s/p Procedure(s): EXPLORATORY LAPAROTOMY, COLON RESECTION, OSTOMY 02/07/2022 Dr. [name] POD#7 CT repeated overnight due to abd distention -  appears stable.   FEN - NG tube, TPN, NPO ok for ice chips VTE - SCDs, lovenox ID - zosyn 11/24 -> 11/29 Disposition - ileus after major resection, path with transmural inflammation consistent with IBD. CT scan overnight looks stable - no abscess or concern for surgical complication. I question if his underlying IBD could be contributing to his fever/tachycardia. Reviewed with Dr. [name] who states she would be ok with a trial of steroids pending GI evaluation - I will discuss with GI today and appreciate their help. Other consideration given ongoing O2 requirement, atelectasis, and increased secretions would be collecting respiratory cultures to exclude a developing pneumonia.    LOS: 7 days   I reviewed last 24 h vitals and pain scores, last 48 h intake and output,  last 24 h labs and trends, and last 24 h imaging results.  This care required high  level of medical decision making.   [facility] 02/15/2022, 11:52 AM Please see Amion for pager number during day hours 7:00am-4:30pm or 7:00am -11:30am on weekends

## 2022-02-15 NOTE — Progress Notes (Signed)
Patient is unable to wear CPAP due to NG still in place.

## 2022-02-16 DIAGNOSIS — R198 Other specified symptoms and signs involving the digestive system and abdomen: Secondary | ICD-10-CM | POA: Diagnosis not present

## 2022-02-16 LAB — BASIC METABOLIC PANEL
Anion gap: 6 (ref 5–15)
Anion gap: 6 (ref 5–15)
Anion gap: 5 (ref 5–15)
BUN: 10 mg/dL (ref 8–23)
BUN: 9 mg/dL (ref 8–23)
BUN: 10 mg/dL (ref 8–23)
CO2: 30 mmol/L (ref 22–32)
CO2: 27 mmol/L (ref 22–32)
CO2: 29 mmol/L (ref 22–32)
Calcium: 7.3 mg/dL — ABNORMAL LOW (ref 8.9–10.3)
Calcium: 7.1 mg/dL — ABNORMAL LOW (ref 8.9–10.3)
Calcium: 7.3 mg/dL — ABNORMAL LOW (ref 8.9–10.3)
Chloride: 97 mmol/L — ABNORMAL LOW (ref 98–111)
Chloride: 96 mmol/L — ABNORMAL LOW (ref 98–111)
Chloride: 100 mmol/L (ref 98–111)
Creatinine, Ser: 0.51 mg/dL — ABNORMAL LOW (ref 0.61–1.24)
Creatinine, Ser: 0.45 mg/dL — ABNORMAL LOW (ref 0.61–1.24)
Creatinine, Ser: 0.34 mg/dL — ABNORMAL LOW (ref 0.61–1.24)
GFR, Estimated: >60 mL/min (ref >60)
GFR, Estimated: >60 mL/min (ref >60)
GFR, Estimated: >60 mL/min (ref >60)
Glucose, Bld: 184 mg/dL — ABNORMAL HIGH (ref 70–99)
Glucose, Bld: 474 mg/dL — ABNORMAL HIGH (ref 70–99)
Glucose, Bld: 179 mg/dL — ABNORMAL HIGH (ref 70–99)
Potassium: 4.2 mmol/L (ref 3.5–5.1)
Potassium: 5.7 mmol/L — ABNORMAL HIGH (ref 3.5–5.1)
Potassium: 4.2 mmol/L (ref 3.5–5.1)
Sodium: 133 mmol/L — ABNORMAL LOW (ref 135–145)
Sodium: 129 mmol/L — ABNORMAL LOW (ref 135–145)
Sodium: 134 mmol/L — ABNORMAL LOW (ref 135–145)

## 2022-02-16 LAB — CBC WITH DIFFERENTIAL/PLATELET
Abs Immature Granulocytes: 0.05 10*3/uL (ref 0.00–0.07)
Basophils Absolute: 0 10*3/uL (ref 0.0–0.1)
Basophils Relative: 0 %
Eosinophils Absolute: 0.1 10*3/uL (ref 0.0–0.5)
Eosinophils Relative: 1 %
HCT: 24.4 % — ABNORMAL LOW (ref 39.0–52.0)
Hemoglobin: 7.9 g/dL — ABNORMAL LOW (ref 13.0–17.0)
Immature Granulocytes: 1 %
Lymphocytes Relative: 8 %
Lymphs Abs: 0.5 10*3/uL — ABNORMAL LOW (ref 0.7–4.0)
MCH: 33.5 pg (ref 26.0–34.0)
MCHC: 32.4 g/dL (ref 30.0–36.0)
MCV: 103.4 fL — ABNORMAL HIGH (ref 80.0–100.0)
Monocytes Absolute: 0.3 10*3/uL (ref 0.1–1.0)
Monocytes Relative: 4 %
Neutro Abs: 5.2 10*3/uL (ref 1.7–7.7)
Neutrophils Relative %: 86 %
Platelets: 209 10*3/uL (ref 150–400)
RBC: 2.36 MIL/uL — ABNORMAL LOW (ref 4.22–5.81)
RDW: 13.7 % (ref 11.5–15.5)
WBC: 6 10*3/uL (ref 4.0–10.5)
nRBC: 0 % (ref 0.0–0.2)

## 2022-02-16 LAB — ACID FAST SMEAR (AFB, MYCOBACTERIA): Acid Fast Smear: NEGATIVE

## 2022-02-16 LAB — GLUCOSE, CAPILLARY
Glucose-Capillary: 185 mg/dL — ABNORMAL HIGH (ref 70–99)
Glucose-Capillary: 188 mg/dL — ABNORMAL HIGH (ref 70–99)
Glucose-Capillary: 180 mg/dL — ABNORMAL HIGH (ref 70–99)
Glucose-Capillary: 200 mg/dL — ABNORMAL HIGH (ref 70–99)
Glucose-Capillary: 189 mg/dL — ABNORMAL HIGH (ref 70–99)

## 2022-02-16 LAB — CULTURE, BLOOD (ROUTINE X 2)
Culture: NO GROWTH
Culture: NO GROWTH

## 2022-02-16 LAB — PHOSPHORUS: Phosphorus: 1.6 mg/dL — ABNORMAL LOW (ref 2.5–4.6)

## 2022-02-16 LAB — MAGNESIUM: Magnesium: 1.7 mg/dL (ref 1.7–2.4)

## 2022-02-16 LAB — IRON AND TIBC
Iron: 17 ug/dL — ABNORMAL LOW (ref 45–182)
Saturation Ratios: 15 % — ABNORMAL LOW (ref 17.9–39.5)
TIBC: 112 ug/dL — ABNORMAL LOW (ref 250–450)
UIBC: 95 ug/dL

## 2022-02-16 LAB — VITAMIN B12: Vitamin B-12: 1429 pg/mL — ABNORMAL HIGH (ref 180–914)

## 2022-02-16 LAB — FERRITIN: Ferritin: 632 ng/mL — ABNORMAL HIGH (ref 24–336)

## 2022-02-16 MED ORDER — MAGNESIUM SULFATE 2 GM/50ML IV SOLN
2.0000 g | Freq: Once | INTRAVENOUS | Status: AC
  Administered 2022-02-16: 2 g via INTRAVENOUS
  Filled 2022-02-16: qty 50

## 2022-02-16 MED ORDER — TRAVASOL 10 % IV SOLN
INTRAVENOUS | Status: DC
  Filled 2022-02-16: qty 708

## 2022-02-16 MED ORDER — DEXTROSE 10 % IV SOLN: INTRAVENOUS | Status: AC

## 2022-02-16 MED ORDER — SODIUM PHOSPHATES 45 MMOLE/15ML IV SOLN
30.0000 mmol | Freq: Once | INTRAVENOUS | Status: AC
  Administered 2022-02-16: 30 mmol via INTRAVENOUS
  Filled 2022-02-16: qty 10

## 2022-02-16 NOTE — Progress Notes (Addendum)
PHARMACY - TOTAL PARENTERAL NUTRITION CONSULT NOTE   Indication: Prolonged ileus  Patient Measurements: Height: '5\' 9"'$  (175.3 cm) Weight: 104.5 kg (230 lb 6.1 oz) IBW/kg (Calculated) : 70.7 TPN AdjBW (KG): 80.3 Body mass index is 34.02 kg/m. Usual Weight:   Assessment: 66 yo M s/p colonic perforation status post exploratory lap, partial colectomy and transverse colostomy 02/08/2022  Pharmacy consulted to manage TPN for prolonged ileus.  Failed NGT clamping trial 11/28.   PMHx Scrotal abscess August 2023, Colon polyp, Diverticulosis, ADHD, Anxiety, Rhinitis, COVID July 2022, HLD, HTN, OSA, PNA, Seborrheic dermatitis, Hypogonadism, DM type 2, Peripheral neuropathy, former smoker,    Glucose / Insulin: hx DM2 on metformin, Jardiance PTA Goal CBG < 180. CBGs all < 140 prior to TPN CBGs: 163-190 on TPN @ 50 ml/hr Q4h resistant SSI: 24 units/24 hrs, 8 units insulin aspart/prior 24 hours Electrolytes: Na continues to trend down now at 133, K 4.2 after replacement and increasing amount in TPN, Phos low at 1.6, Mag low at 1.7 Renal: SCr <1 Hepatic: LFT WNL, Trig 142, ALb 1.5 Intake / Output;I/O 2791 NGO: down 275/24 hrs. Stool out 0/24 hrs. UOP 1700/24 hrs.   MIVF: NS at 40 ml/hr and 1/2NS w/KCl 61mq/L at 10 ml/hr GI Imaging:  11/28 CT A/P: post op ileus GI Surgeries / Procedures:  02/08/22:  colonic perforation status post exploratory lap, partial colectomy and transverse colostomy  Central access: PICC placed 11/28 for TPN TPN start date: 02/13/22  Nutritional Goals: Goal TPN rate is 100 mL/hr (provides 142 g of protein and 2390 kcals per day)  RD Assessment: 11/29 Estimated Needs Total Energy Estimated Needs: 27741-2878kcals Total Protein Estimated Needs: 135-155 grams Total Fluid Estimated Needs: >/= 2.3L  Current Nutrition:  NPO  Plan:  This morning: Magnesium 2 g IV x 1 NaPhos 30 mM IV x 1   At 1800: Continue TPN at 50 ml/hr (increase slowly until electrolytes  stabilized) Electrolytes in TPN: increase Na 100 mEq/L, K 65 mEq/L, Ca 5102m/L, increase Mg 8 mEq/L, and increase Phos 20 mmol/L. Cl:Ac 1:1 Add standard MVI and trace elements to TPN Add thiamine to TPN  continue Resistant q4h SSI and adjust as needed  MIVFL continue NS 40 ml/hr and 1/2 NaCl with KCl 40 mEq/L at 10 ml/hr per MD Monitor TPN labs on Mon/Thurs, BMET, Mg & Phos in AM as ordered by MD   Thank you for allowing pharmacy to be a part of this patient's care.  MeRoyetta AsalPharmD, BCPS Clinical Pharmacist [place] Please utilize Amion for appropriate phone number to reach the unit pharmacist ([place]12/04/2021 8:56 AM

## 2022-02-16 NOTE — Progress Notes (Signed)
PROGRESS NOTE  Richard [name]  DOB: 10-Jan-1956  PCP: [name], MD NLG:[id number]  DOA: 02/07/2022  LOS: 8 days  Hospital Day: 10  Brief narrative: Richard [name] is a 66 y.o. male with PMH significant for DM2, HTN, HLD, obesity, OSA, hypogonadism, peripheral neuropathy.  11/7, seen in the office by Dr. [name] with complaint of diarrhea for about 3 to 4 months, 6-8 episodes a day, mixed with blood associated with abdominal cramping and progressive weakness.  Stool studies at that time were negative for infectious etiology.  Sed rate was elevated 72, fecal calprotectin was elevated to 3570. 11/20, underwent colonoscopy, found to have diffuse colitis with deep ulcerations throughout the entire colon friable mucosa as well as extensive mucosal edema in the rectum.  He was started on prednisone 40 mg daily. After colonoscopy, pat ient developed worsening abdominal pain. 11/23, at home patient had 1 episode of syncope.  Also had a fever.  Abdominal pain worsened and hence came to the ED. In the ED he had fever of 102.1 with tachycardia. CT abdomen pelvis showed perforated bowel with moderate free fluid and free air.  Patient was started on IV antibiotics, IV fluid Patient was admitted to ICU. General surgery was consulted 11/24, patient underwent exploratory laparotomy, descending and sigmoid colon resection, transverse colostomy by Dr. [name] See below for details.  Subjective: Patient was seen and examined this morning.  Propped up in bed.  Drowsy, able to answer simple questions.  Wife at bedside. Per RN, 200 mL of output in ostomy bag today. Noted a plan from general surgery to clamp NG tube today. Fluid and electrolyte balance by nephrology and pharmacy No fever spike after antibiotics was broadened yesterday.  Assessment and plan: Acute peritonitis d/t perforated sigmoid colon Inflammatory bowel disease S/p exploratory laparotomy, descending and sigmoid colon  resection, transverse colostomy - 11/24 Sepsis - POA Being followed by GI, general surgery. Blood culture 11/27 with no growth. Pending report of cultures sent on 12/1. No fever spike after antibiotics was broadened yesterday to IV meropenem and IV vancomycin surgical pathology report showed transmural inflammation consistent with IBD Pain management per general surgery.  Currently on as needed IV Dilaudid. Recent Labs  Lab 02/12/22 0300 02/13/22 0303 02/14/22 0500 02/15/22 0559 02/16/22 0515  WBC 8.8 7.8 8.0 7.2 6.0    Postop ileus 11/28, CT abdomen showed mild dilated small bowel loops with scattered air-fluid levels likely postop ileus.  It also showed scattered free fluid in the abdomen pelvis and small amount of free air and free fluid in the hepatorenal fossa without discrete drainable abscess. Has NG tube to suction.  Noted a plan from general surgery to clamp NG tube today. 11/28, PICC line placed  On TPN since 11/29   Postop respiratory insufficiency Currently on 3L oxygen nasal cannula. 11/28, CT scan showed small bilateral pleural effusion and moderate overlying atelectasis. Incentive spirometry, flutter valve, ambulation as tolerated Continue to wean down as tolerated  Acute metabolic alkalosis 08/14, blood gas showed significant alkalosis with pH 7.6, pCO2 59, serum bicarb 58 This is likely due to gastric secretions causing loss of acid as well as hypovolemia.   Nephrology consult appreciated.  Started on PPI Aggressively hydrated. Continue fluid and electrolyte management per nephrology recommendation.  At Risk of AKI  In setting of n.p.o. status, high output from NG tube third spacing, contrast administration. Currently on IV hydration and TPN So far renal function is normal.  Continue to monitor. Recent Labs  02/09/22 0246 02/10/22 0252 02/11/22 0249 02/12/22 0300 02/13/22 0303 02/14/22 0500 02/14/22 1715 02/15/22 0559 02/15/22 1739 02/16/22 0500   BUN '22 22 15 16 14 13 11 10 10 10  '$ CREATININE 0.68 0.60* 0.60* 0.67 0.65 0.48* 0.48* 0.42* 0.46* 0.51*    Hypokalemia/hypomagnesemia/hypophosphatemia Electrolyte trend as below.  Running low because of loss of gastric secretions, refeeding syndrome from TPN.   Pharmacy nephrology following.  Replacement given. Recent Labs  Lab 02/12/22 0300 02/13/22 0303 02/14/22 0500 02/14/22 1715 02/15/22 0559 02/15/22 1739 02/16/22 0500 02/16/22 0515  K 2.6* 2.9* 2.2* 2.8* 3.8 3.7 4.2  --   MG 2.0 1.9 2.0  --  1.6*  --   --  1.7  PHOS 3.1 2.7 1.9*  --  1.8*  --   --  1.6*    Type 2 diabetes mellitus A1c 6.6 on 01/23/2022 PTA on metformin, Jardiance Currently on sliding scale insulin with Accu-Cheks every 4 hours.  May need long-acting insulin on TPN.  Continue to monitor Recent Labs  Lab 02/15/22 1952 02/15/22 2324 02/16/22 0340 02/16/22 0746 02/16/22 1150  GLUCAP 171* 156* 185* 188* 180*    H/o Essential HTN lisinopril remains on hold   HLD Lipitor remains on hold  Obesity -Body mass index is 34.02 kg/m. Patient has been advised to make an attempt to improve diet and exercise patterns to aid in weight loss.  OSA Baseline CPAP 10 cm H20, followed by Dr. Ander Slade CPAP QSH   NGT may impede mask seal    Hx of ADHD, Anxiety methylphenidate on hold   Hx of Hypogonadism. Was on outpatient testosterone    Moderate Protein Calorie Malnutrition nutrition when ok per [facility]   Goals of care   Code Status: Full Code    Mobility: PT follow-up needed  Infusions:   sodium chloride 40 mL/hr at 02/16/22 1033   chlorproMAZINE (THORAZINE) 12.5 mg in sodium chloride 0.9 % 25 mL IVPB Stopped (02/14/22 0913)   meropenem (MERREM) IV Stopped (02/16/22 4166)   sodium chloride 0.45 % 1,000 mL with potassium chloride 40 mEq infusion 42 mL/hr at 02/16/22 0556   sodium phosphate 30 mmol in dextrose 5 % 250 mL infusion     TPN ADULT (ION) 50 mL/hr at 02/15/22 1841   TPN ADULT (ION)      vancomycin Stopped (02/16/22 0320)    Scheduled Meds:  Chlorhexidine Gluconate Cloth  6 each Topical Daily   enoxaparin (LOVENOX) injection  0.5 mg/kg Subcutaneous Daily   insulin aspart  0-20 Units Subcutaneous Q4H   pantoprazole (PROTONIX) IV  40 mg Intravenous Q24H   sodium chloride flush  10-40 mL Intracatheter Q12H    PRN meds: acetaminophen, chlorproMAZINE (THORAZINE) 12.5 mg in sodium chloride 0.9 % 25 mL IVPB, HYDROmorphone (DILAUDID) injection, ondansetron (ZOFRAN) IV, mouth rinse, sodium chloride flush   Skin assessment:     Nutritional status:  Body mass index is 34.02 kg/m.  Nutrition Problem: Severe Malnutrition Etiology: acute illness (new dx Crohn's colitis; bowel perforation) Signs/Symptoms: moderate fat depletion, moderate muscle depletion, percent weight loss Percent weight loss: 10 % (in 4 months)     Diet:  Diet Order             Diet NPO time specified Except for: Other (See Comments)  Diet effective now                   DVT prophylaxis:  Place and maintain sequential compression device Start: 02/08/22 0001   Antimicrobials: IV  meropenem and IV vancomycin Fluid: TPN per general surgery.  Fluid per nephrology Consultants: General surgery, nephrology Family Communication: Wife at bedside  Status is: Inpatient  Continue in-hospital care because: Postop ileus, requiring NG tube decompression. Level of care: ICU   Dispo: The patient is from: Home              Anticipated d/c is to: Pending clinical course              Patient currently is not medically stable to d/c.   Difficult to place patient No       Antimicrobials: Anti-infectives (From admission, onward)    Start     Dose/Rate Route Frequency Ordered Stop   02/15/22 1400  meropenem (MERREM) 1 g in sodium chloride 0.9 % 100 mL IVPB        1 g 200 mL/hr over 30 Minutes Intravenous Every 8 hours 02/15/22 1244     02/15/22 1400  vancomycin (VANCOCIN) IVPB 1000 mg/200 mL premix         1,000 mg 200 mL/hr over 60 Minutes Intravenous Every 12 hours 02/15/22 1304     02/08/22 1200  piperacillin-tazobactam (ZOSYN) IVPB 3.375 g  Status:  Discontinued        3.375 g 12.5 mL/hr over 240 Minutes Intravenous Every 8 hours 02/08/22 0505 02/15/22 1243   02/08/22 0500  piperacillin-tazobactam (ZOSYN) IVPB 3.375 g  Status:  Discontinued        3.375 g 12.5 mL/hr over 240 Minutes Intravenous Every 8 hours 02/08/22 0006 02/08/22 0505   02/08/22 0334  piperacillin-tazobactam (ZOSYN) 3.375 GM/50ML IVPB       Note to Pharmacy: Enis Gash W: cabinet override      02/08/22 0334 02/08/22 0345   02/07/22 2030  ceFEPIme (MAXIPIME) 2 g in sodium chloride 0.9 % 100 mL IVPB        2 g 200 mL/hr over 30 Minutes Intravenous  Once 02/07/22 2023 02/07/22 2248   02/07/22 2030  metroNIDAZOLE (FLAGYL) IVPB 500 mg        500 mg 100 mL/hr over 60 Minutes Intravenous  Once 02/07/22 2023 02/08/22 0506   02/07/22 2030  vancomycin (VANCOCIN) IVPB 1000 mg/200 mL premix        1,000 mg 200 mL/hr over 60 Minutes Intravenous  Once 02/07/22 2023 02/08/22 0506       Objective: Vitals:   02/16/22 0900 02/16/22 1000  BP: 126/65 130/66  Pulse: (!) 105 (!) 108  Resp: 16 17  Temp:    SpO2: 93% 92%    Intake/Output Summary (Last 24 hours) at 02/16/2022 1204 Last data filed at 02/16/2022 1022 Gross per 24 hour  Intake 3404.37 ml  Output 1800 ml  Net 1604.37 ml    Filed Weights   02/08/22 0500 02/12/22 1801 02/13/22 0436  Weight: 109.1 kg 105 kg 104.5 kg   Weight change:  Body mass index is 34.02 kg/m.   Physical Exam: General exam: Pleasant elderly Caucasian male.  Remains weak, lethargic Skin: No rashes, lesions or ulcers. HEENT: Atraumatic, normocephalic, no obvious bleeding.  NG tube attached to suction  Lungs: Diminished air entry in both bases CVS: Regular rate and rhythm, no murmur GI/Abd soft, mild postop appropriate tenderness, bowel sounds minimal.  Ostomy bag with some  output today. CNS: Drowsy, under the influence of pain medicine.  Psychiatry: Sad affect Extremities: No pedal edema, no calf tenderness  Data Review: I have personally reviewed the laboratory data and studies available.  F/u labs ordered Unresulted Labs (From admission, onward)     Start     Ordered   02/16/22 2633  Basic metabolic panel  2 times daily,   R (with TIMED occurrences)     Question:  Specimen collection method  Answer:  Unit=Unit collect   02/16/22 0735   02/15/22 1303  MRSA Next Gen by PCR, Nasal  (MRSA Screening)  Once,   R        02/15/22 1302   02/15/22 1234  Expectorated Sputum Assessment w Gram Stain, Rflx to Resp Cult  Once,   R        02/15/22 1233   02/14/22 0500  Triglycerides  (TPN Lab Panel)  Every Mon,Thu (0500),   R     Question:  Specimen collection method  Answer:  Lab=Lab collect   02/12/22 1801   02/13/22 0500  CBC with Differential/Platelet  Daily at 5am,   R     Question:  Specimen collection method  Answer:  Lab=Lab collect   02/12/22 0927   02/13/22 0500  Magnesium  Daily at 5am,   R     Question:  Specimen collection method  Answer:  Lab=Lab collect   02/12/22 0927   02/13/22 0500  Phosphorus  Daily at 5am,   R     Question:  Specimen collection method  Answer:  Lab=Lab collect   02/12/22 0927   02/11/22 1800  Acid Fast Smear (AFB)  (AFB smear + Culture w reflexed sensitivities panel)  3 times daily,   R (with TIMED occurrences),   Status:  Canceled     See Hyperspace for full Linked Orders Report.   02/11/22 1544   02/11/22 1800  Acid Fast Culture with reflexed sensitivities  (AFB smear + Culture w reflexed sensitivities panel)  3 times daily,   R (with TIMED occurrences),   Status:  Canceled     See Hyperspace for full Linked Orders Report.   02/11/22 1544            Signed, Terrilee Croak, MD [facility] 02/16/2022

## 2022-02-16 NOTE — Progress Notes (Signed)
[facility] Progress Note  Subjective: seen in room, a little more alert per the RN today. Net I/O is 2.8 L + yesterday. UOP better and 850 today and 1700 yesterday.   Vitals:   02/16/22 0800 02/16/22 0832 02/16/22 0900 02/16/22 1000  BP: (!) 126/59  126/65 130/66  Pulse: (!) 108 (!) 107 (!) 105 (!) 108  Resp: (!) '23 14 16 17  '$ Temp:  98.2 F (36.8 C)    TempSrc:  Oral    SpO2: 92% 92% 93% 92%  Weight:      Height:        Exam: Gen NG tube, a little more alert, wet cough No jvd or bruits Chest clear bilat to bases RRR no MRG Abd soft ntnd no mass or ascites +bs Ext chronic skin changes w/ minimal pretib LE edema Neuro is nonfocal, as above     Assessment/ Plan: Metabolic alkalosis - related to heavy GI losses of gastric secretions causing H+ loss and hypovolemia.  Started cc:cc replacement IVF's and serum CO2 down to 34 yest and --> 30 today. Better. Also started IV PPI to lower gastric fluid H+ content. Will cont cc:cc replacement for now and since he is getting TPN 85 + 40 cc/hr and is closer to euvolemia, will dc the NS at 40 cc/hr. F/u labs in am.  Perforated colon - sp colectomy and colostomy Post op ileus - prolonged, on TPN Hypokalemia - K+ better, in 4s. Follow.  Hypovolemia - better. Need daily wts.        Rob Sabrin Dunlevy 02/16/2022, 12:09 PM   Recent Labs  Lab 02/13/22 0303 02/14/22 0500 02/14/22 1715 02/15/22 0559 02/15/22 1739 02/16/22 0500 02/16/22 0515  HGB 10.2* 8.5*  --  8.2*  --   --  7.9*  ALBUMIN 1.7* 1.5*  --   --   --   --   --   CALCIUM 7.7* 7.7*   < > 7.3* 6.9* 7.3*  --   PHOS 2.7 1.9*  --  1.8*  --   --  1.6*  CREATININE 0.65 0.48*   < > 0.42* 0.46* 0.51*  --   K 2.9* 2.2*   < > 3.8 3.7 4.2  --    < > = values in this interval not displayed.    Recent Labs  Lab 02/16/22 0517  IRON 17*  TIBC 112*  FERRITIN 632*   Inpatient medications:  Chlorhexidine Gluconate Cloth  6 each Topical Daily   enoxaparin (LOVENOX)  injection  0.5 mg/kg Subcutaneous Daily   insulin aspart  0-20 Units Subcutaneous Q4H   pantoprazole (PROTONIX) IV  40 mg Intravenous Q24H   sodium chloride flush  10-40 mL Intracatheter Q12H    sodium chloride 40 mL/hr at 02/16/22 1033   chlorproMAZINE (THORAZINE) 12.5 mg in sodium chloride 0.9 % 25 mL IVPB Stopped (02/14/22 0913)   meropenem (MERREM) IV Stopped (02/16/22 5465)   sodium chloride 0.45 % 1,000 mL with potassium chloride 40 mEq infusion 42 mL/hr at 02/16/22 0556   sodium phosphate 30 mmol in dextrose 5 % 250 mL infusion     TPN ADULT (ION) 50 mL/hr at 02/15/22 1841   TPN ADULT (ION)     vancomycin Stopped (02/16/22 0320)   acetaminophen, chlorproMAZINE (THORAZINE) 12.5 mg in sodium chloride 0.9 % 25 mL IVPB, HYDROmorphone (DILAUDID) injection, ondansetron (ZOFRAN) IV, mouth rinse, sodium chloride flush

## 2022-02-16 NOTE — Progress Notes (Signed)
9 Days Post-Op   Subjective/Chief Complaint: Complains of not feeling well   Objective: Vital signs in last 24 hours: Temp:  [97.6 F (36.4 C)-101.5 F (38.6 C)] 98.1 F (36.7 C) (12/02 0344) Pulse Rate:  [25-118] 108 (12/02 0600) Resp:  [16-25] 21 (12/02 0600) BP: (106-130)/(50-85) 124/60 (12/02 0600) SpO2:  [86 %-97 %] 93 % (12/02 0600) Last BM Date : 02/14/22  Intake/Output from previous day: 12/01 0701 - 12/02 0700 In: 4833.3 [I.V.:3556.3; IV Piggyback:1277] Out: 1175 [Urine:1100; Emesis/NG output:75] Intake/Output this shift: No intake/output data recorded.  General appearance: alert and cooperative Resp: rhonchi bilaterally Cardio: regular rate and rhythm GI: soft, protuberant. Good bs. Ostomy pink and productive  Lab Results:  Recent Labs    02/15/22 0559 02/16/22 0515  WBC 7.2 6.0  HGB 8.2* 7.9*  HCT 25.8* 24.4*  PLT 207 209   BMET Recent Labs    02/15/22 0559 02/15/22 1739  NA 136 128*  K 3.8 3.7  CL 94* 91*  CO2 34* 30  GLUCOSE 164* 190*  BUN 10 10  CREATININE 0.42* 0.46*  CALCIUM 7.3* 6.9*   PT/INR No results for input(s): "LABPROT", "INR" in the last 72 hours. ABG Recent Labs    02/14/22 1209  PHART 7.6*  HCO3 57.9*    Studies/Results: CT ABDOMEN PELVIS WO CONTRAST  Result Date: 02/15/2022 CLINICAL DATA:  History of prior laparoscopy with colonic resection and colostomy, persistent abdominal pain and distension. EXAM: CT ABDOMEN AND PELVIS WITHOUT CONTRAST TECHNIQUE: Multidetector CT imaging of the abdomen and pelvis was performed following the standard protocol without IV contrast. RADIATION DOSE REDUCTION: This exam was performed according to the departmental dose-optimization program which includes automated exposure control, adjustment of the mA and/or kV according to patient size and/or use of iterative reconstruction technique. COMPARISON:  02/12/2022 FINDINGS: Lower chest: Bibasilar consolidation is noted with small right-sided  pleural effusion. Hepatobiliary: Liver is well visualized and within normal limits. Gallbladder demonstrates vicarious excretion of contrast material. Perihepatic fluid is noted similar to that seen on the prior exam. Pancreas: Unremarkable. No pancreatic ductal dilatation or surrounding inflammatory changes. Spleen: Normal in size without focal abnormality. Adrenals/Urinary Tract: Adrenal glands are within normal limits. Kidneys demonstrate no renal calculi or obstructive changes. The bladder is partially distended. Stomach/Bowel: Hartmann's pouch is again identified. Left-sided colostomy is seen. The more proximal colon is unremarkable. The appendix is within normal limits. Stomach is decompressed by a gastric catheter. Mildly dilated loops of small bowel are noted particularly within the right abdomen somewhat progressed when compared with the prior exam. No transition zone is identified. Vascular/Lymphatic: Aortic atherosclerosis. No enlarged abdominal or pelvic lymph nodes. Reproductive: Prostate is unremarkable. Other: Fluid is noted throughout the abdomen to that seen on the prior exam. This is most prominent along the right pericolic gutter and perihepatic regions although similar to the prior study. Small amount of free fluid and air is seen in the hip paddle renal fossa stable from the prior exam likely related to the prior surgery. No discrete abscess is noted. Musculoskeletal: No acute or significant osseous findings. IMPRESSION: Postsurgical changes as described. Free fluid is noted within the abdomen relatively stable in appearance from the prior exam. No discrete abscess is noted at this time. Multiple mildly dilated loops of small bowel in the right mid abdomen slightly greater than that seen on the prior exam. This is again likely related to a postoperative ileus. Correlate with the physical exam. Right-sided pleural effusion and bibasilar consolidation. Electronically Signed  By: Inez Catalina M.D.    On: 02/15/2022 02:12    Anti-infectives: Anti-infectives (From admission, onward)    Start     Dose/Rate Route Frequency Ordered Stop   02/15/22 1400  meropenem (MERREM) 1 g in sodium chloride 0.9 % 100 mL IVPB        1 g 200 mL/hr over 30 Minutes Intravenous Every 8 hours 02/15/22 1244     02/15/22 1400  vancomycin (VANCOCIN) IVPB 1000 mg/200 mL premix        1,000 mg 200 mL/hr over 60 Minutes Intravenous Every 12 hours 02/15/22 1304     02/08/22 1200  piperacillin-tazobactam (ZOSYN) IVPB 3.375 g  Status:  Discontinued        3.375 g 12.5 mL/hr over 240 Minutes Intravenous Every 8 hours 02/08/22 0505 02/15/22 1243   02/08/22 0500  piperacillin-tazobactam (ZOSYN) IVPB 3.375 g  Status:  Discontinued        3.375 g 12.5 mL/hr over 240 Minutes Intravenous Every 8 hours 02/08/22 0006 02/08/22 0505   02/08/22 0334  piperacillin-tazobactam (ZOSYN) 3.375 GM/50ML IVPB       Note to Pharmacy: Enis Gash W: cabinet override      02/08/22 0334 02/08/22 0345   02/07/22 2030  ceFEPIme (MAXIPIME) 2 g in sodium chloride 0.9 % 100 mL IVPB        2 g 200 mL/hr over 30 Minutes Intravenous  Once 02/07/22 2023 02/07/22 2248   02/07/22 2030  metroNIDAZOLE (FLAGYL) IVPB 500 mg        500 mg 100 mL/hr over 60 Minutes Intravenous  Once 02/07/22 2023 02/08/22 0506   02/07/22 2030  vancomycin (VANCOCIN) IVPB 1000 mg/200 mL premix        1,000 mg 200 mL/hr over 60 Minutes Intravenous  Once 02/07/22 2023 02/08/22 0506       Assessment/Plan: s/p Procedure(s): EXPLORATORY LAPAROTOMY, COLON RESECTION, OSTOMY (N/A) Advance diet. Only allow ice chips Clamp ng EXPLORATORY LAPAROTOMY, COLON RESECTION, OSTOMY 02/07/2022 Dr. [name] POD#8 CT repeated overnight due to abd distention - appears stable.    FEN - NG tube, TPN, NPO ok for ice chips VTE - SCDs, lovenox ID - zosyn 11/24 -> 11/29 Disposition - ileus after major resection, path with transmural inflammation consistent with IBD. CT scan overnight  looks stable - no abscess or concern for surgical complication. I question if his underlying IBD could be contributing to his fever/tachycardia. Reviewed with Dr. [name] who states she would be ok with a trial of steroids pending GI evaluation - I will discuss with GI today and appreciate their help. Other consideration given ongoing O2 requirement, atelectasis, and increased secretions would be collecting respiratory cultures to exclude a developing pneumonia.   LOS: 8 days    Richard [name] 02/16/2022

## 2022-02-17 ENCOUNTER — Inpatient Hospital Stay (HOSPITAL_COMMUNITY): Payer: 59

## 2022-02-17 DIAGNOSIS — R198 Other specified symptoms and signs involving the digestive system and abdomen: Secondary | ICD-10-CM | POA: Diagnosis not present

## 2022-02-17 LAB — CBC WITH DIFFERENTIAL/PLATELET
Abs Immature Granulocytes: 0.03 10*3/uL (ref 0.00–0.07)
Basophils Absolute: 0 10*3/uL (ref 0.0–0.1)
Basophils Relative: 0 %
Eosinophils Absolute: 0.1 10*3/uL (ref 0.0–0.5)
Eosinophils Relative: 2 %
HCT: 24.5 % — ABNORMAL LOW (ref 39.0–52.0)
Hemoglobin: 7.9 g/dL — ABNORMAL LOW (ref 13.0–17.0)
Immature Granulocytes: 1 %
Lymphocytes Relative: 10 %
Lymphs Abs: 0.5 10*3/uL — ABNORMAL LOW (ref 0.7–4.0)
MCH: 33.3 pg (ref 26.0–34.0)
MCHC: 32.2 g/dL (ref 30.0–36.0)
MCV: 103.4 fL — ABNORMAL HIGH (ref 80.0–100.0)
Monocytes Absolute: 0.3 10*3/uL (ref 0.1–1.0)
Monocytes Relative: 6 %
Neutro Abs: 3.7 10*3/uL (ref 1.7–7.7)
Neutrophils Relative %: 81 %
Platelets: 215 10*3/uL (ref 150–400)
RBC: 2.37 MIL/uL — ABNORMAL LOW (ref 4.22–5.81)
RDW: 13.8 % (ref 11.5–15.5)
WBC: 4.6 10*3/uL (ref 4.0–10.5)
nRBC: 0 % (ref 0.0–0.2)

## 2022-02-17 LAB — BASIC METABOLIC PANEL
Anion gap: 5 (ref 5–15)
Anion gap: 4 — ABNORMAL LOW (ref 5–15)
Anion gap: 7 (ref 5–15)
BUN: 9 mg/dL (ref 8–23)
BUN: 9 mg/dL (ref 8–23)
BUN: 5 mg/dL — ABNORMAL LOW (ref 8–23)
CO2: 28 mmol/L (ref 22–32)
CO2: 27 mmol/L (ref 22–32)
CO2: 25 mmol/L (ref 22–32)
Calcium: 7.1 mg/dL — ABNORMAL LOW (ref 8.9–10.3)
Calcium: 6.7 mg/dL — ABNORMAL LOW (ref 8.9–10.3)
Calcium: 6.7 mg/dL — ABNORMAL LOW (ref 8.9–10.3)
Chloride: 99 mmol/L (ref 98–111)
Chloride: 100 mmol/L (ref 98–111)
Chloride: 97 mmol/L — ABNORMAL LOW (ref 98–111)
Creatinine, Ser: <0.3 mg/dL — ABNORMAL LOW (ref 0.61–1.24)
Creatinine, Ser: <0.3 mg/dL — ABNORMAL LOW (ref 0.61–1.24)
Creatinine, Ser: 0.37 mg/dL — ABNORMAL LOW (ref 0.61–1.24)
GFR, Estimated: >60 mL/min (ref >60)
Glucose, Bld: 125 mg/dL — ABNORMAL HIGH (ref 70–99)
Glucose, Bld: 129 mg/dL — ABNORMAL HIGH (ref 70–99)
Glucose, Bld: 172 mg/dL — ABNORMAL HIGH (ref 70–99)
Potassium: 4.3 mmol/L (ref 3.5–5.1)
Potassium: 6.2 mmol/L — ABNORMAL HIGH (ref 3.5–5.1)
Potassium: 3.8 mmol/L (ref 3.5–5.1)
Sodium: 132 mmol/L — ABNORMAL LOW (ref 135–145)
Sodium: 131 mmol/L — ABNORMAL LOW (ref 135–145)
Sodium: 129 mmol/L — ABNORMAL LOW (ref 135–145)

## 2022-02-17 LAB — GLUCOSE, CAPILLARY
Glucose-Capillary: 101 mg/dL — ABNORMAL HIGH (ref 70–99)
Glucose-Capillary: 113 mg/dL — ABNORMAL HIGH (ref 70–99)
Glucose-Capillary: 121 mg/dL — ABNORMAL HIGH (ref 70–99)
Glucose-Capillary: 131 mg/dL — ABNORMAL HIGH (ref 70–99)
Glucose-Capillary: 140 mg/dL — ABNORMAL HIGH (ref 70–99)
Glucose-Capillary: 154 mg/dL — ABNORMAL HIGH (ref 70–99)
Glucose-Capillary: 159 mg/dL — ABNORMAL HIGH (ref 70–99)

## 2022-02-17 LAB — PHOSPHORUS: Phosphorus: 1.7 mg/dL — ABNORMAL LOW (ref 2.5–4.6)

## 2022-02-17 LAB — MAGNESIUM: Magnesium: 2 mg/dL (ref 1.7–2.4)

## 2022-02-17 MED ORDER — TRAVASOL 10 % IV SOLN
INTRAVENOUS | Status: AC
  Filled 2022-02-17: qty 708

## 2022-02-17 MED ORDER — SODIUM PHOSPHATES 45 MMOLE/15ML IV SOLN
30.0000 mmol | Freq: Once | INTRAVENOUS | Status: AC
  Administered 2022-02-17: 30 mmol via INTRAVENOUS
  Filled 2022-02-17: qty 10

## 2022-02-17 MED ORDER — SODIUM CHLORIDE 0.45 % IV SOLN
INTRAVENOUS | Status: DC
  Administered 2022-02-19: 238 mL/h via INTRAVENOUS

## 2022-02-17 NOTE — Progress Notes (Signed)
Cpap on hold due to patient still having NG tube

## 2022-02-17 NOTE — Progress Notes (Signed)
Spoke with Dr Ninfa Linden in regards to NGT removal vs advancement. MD states to advance it, xray and put back on LWS.

## 2022-02-17 NOTE — Progress Notes (Signed)
PHARMACY - TOTAL PARENTERAL NUTRITION CONSULT NOTE   Indication: Prolonged ileus  Patient Measurements: Height: '5\' 9"'$  (175.3 cm) Weight: 104.5 kg (230 lb 6.1 oz) IBW/kg (Calculated) : 70.7 TPN AdjBW (KG): 80.3 Body mass index is 34.02 kg/m. Usual Weight:   Assessment: 66 yo M s/p colonic perforation status post exploratory lap, partial colectomy and transverse colostomy 02/08/2022  Pharmacy consulted to manage TPN for prolonged ileus.  Failed NGT clamping trial 11/28.   PMHx Scrotal abscess August 2023, Colon polyp, Diverticulosis, ADHD, Anxiety, Rhinitis, COVID July 2022, HLD, HTN, OSA, PNA, Seborrheic dermatitis, Hypogonadism, DM type 2, Peripheral neuropathy, former smoker,    Glucose / Insulin: hx DM2 on metformin, Jardiance PTA Goal CBG < 180. CBGs all < 140 prior to TPN CBGs: 163-190 on TPN @ 50 ml/hr Q4h resistant SSI: 24 units/24 hrs, 8 units insulin aspart/prior 24 hours Electrolytes: Na remains low at 132, K 5.7>4.2>4.3>6.2 (TPN held overnight through today and D10 ordered), Phos low at 1.7, Mag now WNL Renal: SCr <1 Hepatic: LFT WNL, Trig 142, ALb 1.5 Intake / Output;I/O 2791 NGO: down 275/24 hrs. Stool out 0/24 hrs. UOP 1700/24 hrs.   MIVF: NS at 40 ml/hr and 1/2NS w/KCl 8mq/L at 10 ml/hr GI Imaging:  11/28 CT A/P: post op ileus GI Surgeries / Procedures:  02/08/22:  colonic perforation status post exploratory lap, partial colectomy and transverse colostomy  Central access: PICC placed 11/28 for TPN TPN start date: 02/13/22 12/2 evening - 12/3  - stopped TPN & infused D10W due to hyperkalemia  Nutritional Goals: Goal TPN rate is 100 mL/hr (provides 142 g of protein and 2390 kcals per day)  RD Assessment: 11/29 Estimated Needs Total Energy Estimated Needs: 20258-5277kcals Total Protein Estimated Needs: 135-155 grams Total Fluid Estimated Needs: >/= 2.3L  Current Nutrition:  NPO  Plan:  This morning: NaPhos 30 mM IV x 1   At 1800: Stop Dextrose 10%  infusion Continue TPN at 50 ml/hr (increase slowly until electrolytes stabilized) Electrolytes in TPN: increase Na 120 mEq/L, remove K, Ca 578m/L, decrease Mg 6 mEq/L, and increase Phos 24 mmol/L. Cl:Ac 1:1 Add standard MVI and trace elements to TPN Add thiamine to TPN  continue Resistant q4h SSI and adjust as needed  MIVFL continue NS 40 ml/hr and [place] to reorder replacement fluids without K Monitor TPN labs on Mon/Thurs, BMET, Mg & Phos in AM as ordered by MD   Thank you for allowing pharmacy to be a part of this patient's care.  MeRoyetta AsalPharmD, BCPS Clinical Pharmacist [place] Please utilize Amion for appropriate phone number to reach the unit pharmacist ([email or web address]/05/2021 9:41 AM

## 2022-02-17 NOTE — Progress Notes (Signed)
[facility] Progress Note  Subjective: seen in ICU. UOP 1000 yest and 2150 so far today. BP's are good. Ostomy has started to function and NG losses are down. K+ was 6.2, so KCl was taken out of the replacement fluids and also out of the TPN. A repeat this evening Is 3.8. CO2 is normal at 25 today. Creat 0.37.  pt w/o any new c/o's.   Vitals:   02/17/22 1800 02/17/22 1855 02/17/22 1900 02/17/22 2000  BP: (!) 112/51  (!) 128/58 (!) 97/53  Pulse: (!) 107  (!) 110 (!) 115  Resp: (!) 23  (!) 24 17  Temp:  98.2 F (36.8 C)  100.3 F (37.9 C)  TempSrc:  Oral  Oral  SpO2: 95%  92% 94%  Weight:      Height:        Exam: Gen NG tube, alert No jvd or bruits Chest clear bilat to bases RRR no MRG Abd soft ntnd no mass or ascites +bs Ext chronic skin changes, 1+ bilat pretib edema Neuro is lethargic but responsive     Assessment/ Plan: Metabolic alkalosis - related to heavy GI losses of gastric secretions causing H+ loss and hypovolemia. With volume repletion and return of bowel, the alkalosis and hypokalemia has completely resolved. Serum CO2 25 today. NG losses are sig down and gut appears to be working now. Will dc the cc:cc replacement fluids. TPN will maintain hydration I believe, if not add-on maint IVF's. No further suggestions, will sign off.  Hyperkalemia - tranisent, resolved this afternoon Perforated colon - sp colectomy and colostomy Post op ileus - colostomy bag emptied twice today, improving    Rob Cheris Tweten 02/17/2022, 8:52 PM   Recent Labs  Lab 02/13/22 0303 02/14/22 0500 02/14/22 1715 02/16/22 0515 02/16/22 1848 02/17/22 0432 02/17/22 0852 02/17/22 1732  HGB 10.2* 8.5*   < > 7.9*  --  7.9*  --   --   ALBUMIN 1.7* 1.5*  --   --   --   --   --   --   CALCIUM 7.7* 7.7*   < >  --    < > 7.1* 6.7* 6.7*  PHOS 2.7 1.9*   < > 1.6*  --  1.7*  --   --   CREATININE 0.65 0.48*   < >  --    < > <0.30* <0.30* 0.37*  K 2.9* 2.2*   < >  --    < > 4.3 6.2* 3.8    < > = values in this interval not displayed.    Recent Labs  Lab 02/16/22 0517  IRON 17*  TIBC 112*  FERRITIN 632*    Inpatient medications:  Chlorhexidine Gluconate Cloth  6 each Topical Daily   enoxaparin (LOVENOX) injection  0.5 mg/kg Subcutaneous Daily   insulin aspart  0-20 Units Subcutaneous Q4H   pantoprazole (PROTONIX) IV  40 mg Intravenous Q24H   sodium chloride flush  10-40 mL Intracatheter Q12H    sodium chloride 110 mL/hr at 02/17/22 1800   chlorproMAZINE (THORAZINE) 12.5 mg in sodium chloride 0.9 % 25 mL IVPB Stopped (02/14/22 0913)   meropenem (MERREM) IV Stopped (02/17/22 1346)   TPN ADULT (ION) 50 mL/hr at 02/17/22 1800   vancomycin Stopped (02/17/22 1507)   acetaminophen, chlorproMAZINE (THORAZINE) 12.5 mg in sodium chloride 0.9 % 25 mL IVPB, HYDROmorphone (DILAUDID) injection, ondansetron (ZOFRAN) IV, mouth rinse, sodium chloride flush

## 2022-02-17 NOTE — Progress Notes (Signed)
PROGRESS NOTE  Richard [name]  DOB: [birth year]  PCP: [name], MD UXN:[id number]  DOA: 02/07/2022  LOS: 9 days  Hospital Day: 11  Brief narrative: Richard [name] is a 66 y.o. male with PMH significant for DM2, HTN, HLD, obesity, OSA, hypogonadism, peripheral neuropathy.  11/7, seen in the office by Dr. [name] with complaint of diarrhea for about 3 to 4 months, 6-8 episodes a day, mixed with blood associated with abdominal cramping and progressive weakness.  Stool studies at that time were negative for infectious etiology.  Sed rate was elevated 72, fecal calprotectin was elevated to 3570. 11/20, underwent colonoscopy, found to have diffuse colitis with deep ulcerations throughout the entire colon friable mucosa as well as extensive mucosal edema in the rectum.  He was started on prednisone 40 mg daily. After colonoscopy, pat ient developed worsening abdominal pain. 11/23, at home patient had 1 episode of syncope.  Also had a fever.  Abdominal pain worsened and hence came to the ED. In the ED he had fever of 102.1 with tachycardia. CT abdomen pelvis showed perforated bowel with moderate free fluid and free air.  Patient was started on IV antibiotics, IV fluid Patient was admitted to ICU. General surgery was consulted 11/24, patient underwent exploratory laparotomy, descending and sigmoid colon resection, transverse colostomy by Dr. [name] See below for details.  Subjective: Patient was seen and examined this morning.  A little more awake today.  Slow to answer to orientation questions. NG tube clamped yesterday.  Per nursing staff, as well as wife at bedside, bag was emptied multiple times last night.  Noted a plan from general surgery to remove NG tube today. No fever spikes.  Currently on 3 L oxygen by nasal cannula.  Assessment and plan: Acute peritonitis d/t perforated sigmoid colon Inflammatory bowel disease S/p exploratory laparotomy, descending and sigmoid colon  resection, transverse colostomy - 11/24 Sepsis - POA Being followed by GI, general surgery. Blood culture 11/27 with no growth. Pending report of cultures sent on 12/1. No fever spike after antibiotics was broadened to IV meropenem and IV vancomycin on 12/1 surgical pathology report showed transmural inflammation consistent with IBD Pain management per general surgery.  Currently on as needed IV Dilaudid. Recent Labs  Lab 02/13/22 0303 02/14/22 0500 02/15/22 0559 02/16/22 0515 02/17/22 0432  WBC 7.8 8.0 7.2 6.0 4.6   Postop ileus 11/28, CT abdomen showed mild dilated small bowel loops with scattered air-fluid levels likely postop ileus.  It also showed scattered free fluid in the abdomen pelvis and small amount of free air and free fluid in the hepatorenal fossa without discrete drainable abscess. Postop ileus gradually improving.  NG tube clamped yesterday morning.  Good output from ostomy bag in last 24 hours.  Noted a plan from general surgery to remove NG tube today. 11/28, PICC line placed and TPN was started on 11/29.  TPN stopped this morning.   Postop respiratory insufficiency 11/28, CT scan showed small bilateral pleural effusion and moderate overlying atelectasis. Incentive spirometry, flutter valve, ambulation as tolerated Currently on 3 L oxygen via nasal cannula. Continue to wean down as tolerated  Acute metabolic alkalosis 25/42, blood gas showed significant alkalosis with pH 7.6, pCO2 59, serum bicarb 58 This is likely due to gastric secretions causing loss of acid as well as hypovolemia.   Nephrology consult appreciated.  Started on PPI Aggressively hydrated. Continue fluid and electrolyte management per nephrology recommendation.  At Risk of AKI  In setting of n.p.o.  status, high output from NG tube third spacing, contrast administration. Currently on IV hydration and TPN So far renal function is normal.  Continue to monitor. Recent Labs    02/13/22 0303  02/14/22 0500 02/14/22 1715 02/15/22 0559 02/15/22 1739 02/16/22 0500 02/16/22 1848 02/16/22 2021 02/17/22 0432 02/17/22 0852  BUN '14 13 11 10 10 10 9 10 9 9  '$ CREATININE 0.65 0.48* 0.48* 0.42* 0.46* 0.51* 0.45* 0.34* <0.30* <0.30*   Hypokalemia/hyperkalemia  Electrolyte management challenging because of poor oral intake, high volume NG tube output and the use of TPN.   Pharmacy and nephrology following.  Potassium level elevated to 6.2 this morning.  TPN stopped.  Continue to monitor. Recent Labs  Lab 02/13/22 0303 02/14/22 0500 02/14/22 1715 02/15/22 0559 02/15/22 1739 02/16/22 0500 02/16/22 0515 02/16/22 1848 02/16/22 2021 02/17/22 0432 02/17/22 0852  K 2.9* 2.2*   < > 3.8   < > 4.2  --  5.7* 4.2 4.3 6.2*  MG 1.9 2.0  --  1.6*  --   --  1.7  --   --  2.0  --   PHOS 2.7 1.9*  --  1.8*  --   --  1.6*  --   --  1.7*  --    < > = values in this interval not displayed.   Type 2 diabetes mellitus A1c 6.6 on 01/23/2022 PTA on metformin, Jardiance Currently on sliding scale insulin with Accu-Cheks every 4 hours.  Continue to monitor.   Recent Labs  Lab 02/16/22 1605 02/16/22 1932 02/17/22 0015 02/17/22 0327 02/17/22 0800  GLUCAP 200* 189* 131* 140* 121*   Impaired mobility Significant weakness.  Multifactorial: Low-level electrolytes, no oral intake, dehydration, physical deconditioning PT eval ordered.  Acute metabolic encephalopathy Patient is slow to respond, clouded and disoriented. Expect mental status to improve with clinical improvement.  H/o Essential HTN lisinopril remains on hold   HLD Lipitor remains on hold  Obesity -Body mass index is 34.02 kg/m. Patient has been advised to make an attempt to improve diet and exercise patterns to aid in weight loss.  OSA Baseline CPAP 10 cm H20, followed by Dr. Ander Slade CPAP QSH   NGT may impede mask seal    Hx of ADHD, Anxiety methylphenidate on hold   Hx of Hypogonadism. Was on outpatient testosterone     Moderate Protein Calorie Malnutrition Dietitian consulted.   Goals of care   Code Status: Full Code    Mobility: PT follow-up needed.  Infusions:   chlorproMAZINE (THORAZINE) 12.5 mg in sodium chloride 0.9 % 25 mL IVPB Stopped (02/14/22 0913)   dextrose 50 mL/hr at 02/16/22 2104   meropenem (MERREM) IV Stopped (02/17/22 0430)   sodium chloride 0.45 % 1,000 mL with potassium chloride 40 mEq infusion 58 mL/hr at 02/17/22 0900   sodium phosphate 30 mmol in dextrose 5 % 250 mL infusion 30 mmol (02/17/22 1032)   TPN ADULT (ION)     vancomycin Stopped (02/17/22 0216)    Scheduled Meds:  Chlorhexidine Gluconate Cloth  6 each Topical Daily   enoxaparin (LOVENOX) injection  0.5 mg/kg Subcutaneous Daily   insulin aspart  0-20 Units Subcutaneous Q4H   pantoprazole (PROTONIX) IV  40 mg Intravenous Q24H   sodium chloride flush  10-40 mL Intracatheter Q12H    PRN meds: acetaminophen, chlorproMAZINE (THORAZINE) 12.5 mg in sodium chloride 0.9 % 25 mL IVPB, HYDROmorphone (DILAUDID) injection, ondansetron (ZOFRAN) IV, mouth rinse, sodium chloride flush   Skin assessment:  Nutritional status:  Body mass index is 34.02 kg/m.  Nutrition Problem: Severe Malnutrition Etiology: acute illness (new dx Crohn's colitis; bowel perforation) Signs/Symptoms: moderate fat depletion, moderate muscle depletion, percent weight loss Percent weight loss: 10 % (in 4 months)     Diet:  Diet Order             Diet NPO time specified Except for: Other (See Comments)  Diet effective now                   DVT prophylaxis:  Place and maintain sequential compression device Start: 02/08/22 0001   Antimicrobials: IV meropenem and IV vancomycin to continue Fluid: TPN per general surgery.  Fluid per nephrology Consultants: General surgery, nephrology Family Communication: Wife at bedside  Status is: Inpatient  Continue in-hospital care because: Gradually improving postop ileus. Level of  care: ICU   Dispo: The patient is from: Home              Anticipated d/c is to: Pending clinical course              Patient currently is not medically stable to d/c.   Difficult to place patient No       Antimicrobials: Anti-infectives (From admission, onward)    Start     Dose/Rate Route Frequency Ordered Stop   02/15/22 1400  meropenem (MERREM) 1 g in sodium chloride 0.9 % 100 mL IVPB        1 g 200 mL/hr over 30 Minutes Intravenous Every 8 hours 02/15/22 1244     02/15/22 1400  vancomycin (VANCOCIN) IVPB 1000 mg/200 mL premix        1,000 mg 200 mL/hr over 60 Minutes Intravenous Every 12 hours 02/15/22 1304     02/08/22 1200  piperacillin-tazobactam (ZOSYN) IVPB 3.375 g  Status:  Discontinued        3.375 g 12.5 mL/hr over 240 Minutes Intravenous Every 8 hours 02/08/22 0505 02/15/22 1243   02/08/22 0500  piperacillin-tazobactam (ZOSYN) IVPB 3.375 g  Status:  Discontinued        3.375 g 12.5 mL/hr over 240 Minutes Intravenous Every 8 hours 02/08/22 0006 02/08/22 0505   02/08/22 0334  piperacillin-tazobactam (ZOSYN) 3.375 GM/50ML IVPB       Note to Pharmacy: Enis Gash W: cabinet override      02/08/22 0334 02/08/22 0345   02/07/22 2030  ceFEPIme (MAXIPIME) 2 g in sodium chloride 0.9 % 100 mL IVPB        2 g 200 mL/hr over 30 Minutes Intravenous  Once 02/07/22 2023 02/07/22 2248   02/07/22 2030  metroNIDAZOLE (FLAGYL) IVPB 500 mg        500 mg 100 mL/hr over 60 Minutes Intravenous  Once 02/07/22 2023 02/08/22 0506   02/07/22 2030  vancomycin (VANCOCIN) IVPB 1000 mg/200 mL premix        1,000 mg 200 mL/hr over 60 Minutes Intravenous  Once 02/07/22 2023 02/08/22 0506       Objective: Vitals:   02/17/22 1000 02/17/22 1100  BP: 125/62 125/60  Pulse: (!) 106 (!) 110  Resp: (!) 23 18  Temp:    SpO2: 93% 95%    Intake/Output Summary (Last 24 hours) at 02/17/2022 1211 Last data filed at 02/17/2022 0900 Gross per 24 hour  Intake 2187.84 ml  Output 1125 ml  Net  1062.84 ml   Filed Weights   02/08/22 0500 02/12/22 1801 02/13/22 0436  Weight: 109.1 kg 105 kg 104.5  kg   Weight change:  Body mass index is 34.02 kg/m.   Physical Exam: General exam: Pleasant elderly Caucasian male.  Alert more awake today. Skin: No rashes, lesions or ulcers. HEENT: Atraumatic, normocephalic, no obvious bleeding.  NG tube remains clamped. Lungs: Diminished air entry in both bases.  No crackles or wheezing. CVS: Regular rate and rhythm, no murmur GI/Abd soft, mild postop appropriate tenderness, bowel sounds minimal.  Ostomy bag with liquid output. CNS: Eyes open, slow to answer questions. Psychiatry: Sad affect Extremities: No pedal edema, no calf tenderness  Data Review: I have personally reviewed the laboratory data and studies available.  F/u labs ordered Unresulted Labs (From admission, onward)     Start     Ordered   02/16/22 4709  Basic metabolic panel  2 times daily,   R     Question:  Specimen collection method  Answer:  Unit=Unit collect   02/16/22 0735   02/15/22 1303  MRSA Next Gen by PCR, Nasal  (MRSA Screening)  Once,   R        02/15/22 1302   02/15/22 1234  Expectorated Sputum Assessment w Gram Stain, Rflx to Resp Cult  Once,   R        02/15/22 1233   02/14/22 0500  Triglycerides  (TPN Lab Panel)  Every Mon,Thu (0500),   R     Question:  Specimen collection method  Answer:  Lab=Lab collect   02/12/22 1801   02/13/22 0500  CBC with Differential/Platelet  Daily at 5am,   R     Question:  Specimen collection method  Answer:  Lab=Lab collect   02/12/22 0927   02/13/22 0500  Magnesium  Daily at 5am,   R     Question:  Specimen collection method  Answer:  Lab=Lab collect   02/12/22 0927   02/13/22 0500  Phosphorus  Daily at 5am,   R     Question:  Specimen collection method  Answer:  Lab=Lab collect   02/12/22 0927   02/11/22 1800  Acid Fast Culture with reflexed sensitivities  (AFB smear + Culture w reflexed sensitivities panel)  3 times  daily,   R,   Status:  Canceled     See Hyperspace for full Linked Orders Report.   02/11/22 1544            Signed, Terrilee Croak, MD [facility] 02/17/2022

## 2022-02-17 NOTE — Progress Notes (Signed)
10 Days Post-Op   Subjective/Chief Complaint: Feeling tired. [name] has been clamped since yesterday and ostomy has been emptied several times per family   Objective: Vital signs in last 24 hours: Temp:  [97.7 F (36.5 C)-98.4 F (36.9 C)] 97.7 F (36.5 C) (12/03 0300) Pulse Rate:  [100-109] 105 (12/03 0800) Resp:  [13-33] 21 (12/03 0800) BP: (118-144)/(53-73) 123/68 (12/03 0800) SpO2:  [91 %-97 %] 91 % (12/03 0800) Last BM Date : 02/14/22  Intake/Output from previous day: 12/02 0701 - 12/03 0700 In: 2071.8 [I.V.:1068.1; NG/GT:50; IV Piggyback:953.8] Out: 1575 [Urine:1400; Emesis/NG output:100; Stool:75] Intake/Output this shift: No intake/output data recorded.  General appearance: alert and cooperative Resp: rhonchi bilaterally Cardio: regular rate and rhythm GI: soft, mild tenderness. Ostomy pink and productive  Lab Results:  Recent Labs    02/16/22 0515 02/17/22 0432  WBC 6.0 4.6  HGB 7.9* 7.9*  HCT 24.4* 24.5*  PLT 209 215   BMET Recent Labs    02/16/22 1848 02/16/22 2021  NA 129* 134*  K 5.7* 4.2  CL 96* 100  CO2 27 29  GLUCOSE 474* 179*  BUN 9 10  CREATININE 0.45* 0.34*  CALCIUM 7.1* 7.3*   PT/INR No results for input(s): "LABPROT", "INR" in the last 72 hours. ABG Recent Labs    02/14/22 1209  PHART 7.6*  HCO3 57.9*    Studies/Results: No results found.  Anti-infectives: Anti-infectives (From admission, onward)    Start     Dose/Rate Route Frequency Ordered Stop   02/15/22 1400  meropenem (MERREM) 1 g in sodium chloride 0.9 % 100 mL IVPB        1 g 200 mL/hr over 30 Minutes Intravenous Every 8 hours 02/15/22 1244     02/15/22 1400  vancomycin (VANCOCIN) IVPB 1000 mg/200 mL premix        1,000 mg 200 mL/hr over 60 Minutes Intravenous Every 12 hours 02/15/22 1304     02/08/22 1200  piperacillin-tazobactam (ZOSYN) IVPB 3.375 g  Status:  Discontinued        3.375 g 12.5 mL/hr over 240 Minutes Intravenous Every 8 hours 02/08/22 0505 02/15/22  1243   02/08/22 0500  piperacillin-tazobactam (ZOSYN) IVPB 3.375 g  Status:  Discontinued        3.375 g 12.5 mL/hr over 240 Minutes Intravenous Every 8 hours 02/08/22 0006 02/08/22 0505   02/08/22 0334  piperacillin-tazobactam (ZOSYN) 3.375 GM/50ML IVPB       Note to Pharmacy: Enis Gash W: cabinet override      02/08/22 0334 02/08/22 0345   02/07/22 2030  ceFEPIme (MAXIPIME) 2 g in sodium chloride 0.9 % 100 mL IVPB        2 g 200 mL/hr over 30 Minutes Intravenous  Once 02/07/22 2023 02/07/22 2248   02/07/22 2030  metroNIDAZOLE (FLAGYL) IVPB 500 mg        500 mg 100 mL/hr over 60 Minutes Intravenous  Once 02/07/22 2023 02/08/22 0506   02/07/22 2030  vancomycin (VANCOCIN) IVPB 1000 mg/200 mL premix        1,000 mg 200 mL/hr over 60 Minutes Intravenous  Once 02/07/22 2023 02/08/22 0506       Assessment/Plan: s/p Procedure(s): EXPLORATORY LAPAROTOMY, COLON RESECTION, OSTOMY (N/A) Will check abd xrays today. If improved then will consider d/c ng and starting clears EXPLORATORY LAPAROTOMY, COLON RESECTION, OSTOMY 02/07/2022 Dr. [name] POD#9 CT repeated overnight due to abd distention - appears stable.    FEN - NG tube, TPN, NPO ok for ice  chips VTE - SCDs, lovenox ID - zosyn 11/24 -> 11/29 Disposition - ileus after major resection, path with transmural inflammation consistent with IBD. CT scan overnight looks stable - no abscess or concern for surgical complication. I question if his underlying IBD could be contributing to his fever/tachycardia. Reviewed with Dr. [name] who states she would be ok with a trial of steroids pending GI evaluation - I will discuss with GI today and appreciate their help. Other consideration given ongoing O2 requirement, atelectasis, and increased secretions would be collecting respiratory cultures to exclude a developing pneumonia.   LOS: 9 days    Richard [name] 02/17/2022

## 2022-02-17 NOTE — Progress Notes (Signed)
CPAP held due to patient having NG in place

## 2022-02-18 DIAGNOSIS — R198 Other specified symptoms and signs involving the digestive system and abdomen: Secondary | ICD-10-CM | POA: Diagnosis not present

## 2022-02-18 DIAGNOSIS — K50119 Crohn's disease of large intestine with unspecified complications: Secondary | ICD-10-CM | POA: Diagnosis not present

## 2022-02-18 DIAGNOSIS — K567 Ileus, unspecified: Secondary | ICD-10-CM | POA: Diagnosis not present

## 2022-02-18 DIAGNOSIS — K9189 Other postprocedural complications and disorders of digestive system: Secondary | ICD-10-CM | POA: Diagnosis not present

## 2022-02-18 LAB — GLUCOSE, CAPILLARY
Glucose-Capillary: 129 mg/dL — ABNORMAL HIGH (ref 70–99)
Glucose-Capillary: 147 mg/dL — ABNORMAL HIGH (ref 70–99)
Glucose-Capillary: 150 mg/dL — ABNORMAL HIGH (ref 70–99)
Glucose-Capillary: 161 mg/dL — ABNORMAL HIGH (ref 70–99)
Glucose-Capillary: 163 mg/dL — ABNORMAL HIGH (ref 70–99)
Glucose-Capillary: 171 mg/dL — ABNORMAL HIGH (ref 70–99)

## 2022-02-18 LAB — CBC WITH DIFFERENTIAL/PLATELET
Abs Immature Granulocytes: 0.05 10*3/uL (ref 0.00–0.07)
Basophils Absolute: 0 10*3/uL (ref 0.0–0.1)
Basophils Relative: 0 %
Eosinophils Absolute: 0.1 10*3/uL (ref 0.0–0.5)
Eosinophils Relative: 2 %
HCT: 24.3 % — ABNORMAL LOW (ref 39.0–52.0)
Hemoglobin: 7.8 g/dL — ABNORMAL LOW (ref 13.0–17.0)
Immature Granulocytes: 1 %
Lymphocytes Relative: 9 %
Lymphs Abs: 0.5 10*3/uL — ABNORMAL LOW (ref 0.7–4.0)
MCH: 33.5 pg (ref 26.0–34.0)
MCHC: 32.1 g/dL (ref 30.0–36.0)
MCV: 104.3 fL — ABNORMAL HIGH (ref 80.0–100.0)
Monocytes Absolute: 0.4 10*3/uL (ref 0.1–1.0)
Monocytes Relative: 7 %
Neutro Abs: 4.7 10*3/uL (ref 1.7–7.7)
Neutrophils Relative %: 81 %
Platelets: 241 10*3/uL (ref 150–400)
RBC: 2.33 MIL/uL — ABNORMAL LOW (ref 4.22–5.81)
RDW: 14 % (ref 11.5–15.5)
WBC: 5.7 10*3/uL (ref 4.0–10.5)
nRBC: 0 % (ref 0.0–0.2)

## 2022-02-18 LAB — MAGNESIUM: Magnesium: 1.6 mg/dL — ABNORMAL LOW (ref 1.7–2.4)

## 2022-02-18 LAB — BASIC METABOLIC PANEL
Anion gap: 4 — ABNORMAL LOW (ref 5–15)
BUN: 7 mg/dL — ABNORMAL LOW (ref 8–23)
CO2: 28 mmol/L (ref 22–32)
Calcium: 7.1 mg/dL — ABNORMAL LOW (ref 8.9–10.3)
Chloride: 100 mmol/L (ref 98–111)
Creatinine, Ser: 0.44 mg/dL — ABNORMAL LOW (ref 0.61–1.24)
GFR, Estimated: >60 mL/min (ref >60)
Glucose, Bld: 148 mg/dL — ABNORMAL HIGH (ref 70–99)
Potassium: 3.8 mmol/L (ref 3.5–5.1)
Sodium: 132 mmol/L — ABNORMAL LOW (ref 135–145)

## 2022-02-18 LAB — PHOSPHORUS: Phosphorus: 1.8 mg/dL — ABNORMAL LOW (ref 2.5–4.6)

## 2022-02-18 LAB — TRIGLYCERIDES: Triglycerides: 116 mg/dL (ref <150)

## 2022-02-18 LAB — MRSA NEXT GEN BY PCR, NASAL: MRSA by PCR Next Gen: NOT DETECTED

## 2022-02-18 MED ORDER — TRAVASOL 10 % IV SOLN
INTRAVENOUS | Status: DC
  Filled 2022-02-18: qty 1062

## 2022-02-18 MED ORDER — MAGNESIUM SULFATE 2 GM/50ML IV SOLN
2.0000 g | Freq: Once | INTRAVENOUS | Status: AC
  Administered 2022-02-18: 2 g via INTRAVENOUS
  Filled 2022-02-18: qty 50

## 2022-02-18 MED ORDER — TRAVASOL 10 % IV SOLN
INTRAVENOUS | Status: DC
  Filled 2022-02-18: qty 849.6

## 2022-02-18 MED ORDER — TRAVASOL 10 % IV SOLN
INTRAVENOUS | Status: AC
  Filled 2022-02-18: qty 849.6

## 2022-02-18 MED ORDER — SODIUM PHOSPHATES 45 MMOLE/15ML IV SOLN
30.0000 mmol | Freq: Once | INTRAVENOUS | Status: AC
  Administered 2022-02-18: 30 mmol via INTRAVENOUS
  Filled 2022-02-18: qty 10

## 2022-02-18 NOTE — Progress Notes (Addendum)
Daily Progress Note  Hospital Day: 12  Chief Complaint: IBD, bowel perforation  Brief History 66 y.o. male with a pmh not limited to DM, HTN, HLD, ADD    Assessment:   66 yo male with newly diagnosed IBD ( non-specific IBD with active chronic and no granulomas on biopsies. Admitted with bowel bowel perforation s/p exploratory lap, sigmoid resection and transverse colostomy on 11/23.  CTAP wo contrast on 12/1 ( done for fever and increasing abdominal pain) was negative for any intra-abdominal abscess, did show post-op ileus.  We have held off on starting steroids pending infectious workup. Blood cultures negative.   # Malnutrition, getting TNA   # Macrocytic anemia.  No active bleeding. Hgb stable at 7.8 Elevated B12 level  # Post-op ileus. CT scan 12/1>>Multiple mildly dilated loops of small bowel in the right mid abdomen slightly greater than that seen on the prior exam.   # Metabolic alkalosis, resolved. Nephrology has been following.    Plan:    Plan is to start Remicade in 4-6 weeks if ok with surgery. Of note, Quantiferon Gold was indeterminate but AFB negative x3.  Surgery has rounded and clamping NGT for 6 hours and allowing clear liquids. If does okay then NGT will be removed.    GI Attending:  I have also seen and evaluated the patient and reviewed imaging, labs and the chart.  Ileus issues persist - tolerated a clamp trial today w/o vomiting though felt nauseous. Had just a little bit po.  550 cc out in canister after NG back on suction.  Cannot use Reglan due to prolonged QT on EKG but that was a new finding - will recheck  Have not started steroids or remicade at this point - especially wanted to avoid steroids given recent surgery. Could start Remicade but logistically tricky in house and doubtful it would change much re: ileus but may need to try.  If QT c ok on repeat EKG will try Reglan  KUB in AM   Gatha Mayer, MD, [facility]  Gastroenterology See [name] on call - gastroenterology for best contact person 02/18/2022 3:56 PM  Subjective    PT is with him. No specific complaints   Objective   Endoscopic studies:  Colonoscopy 02/04/2022: -Preparation of the colon was fair, but adequate for the purposes of this study. - Anal fissure found on perianal exam. - Diffuse severe inflammation was found in the entire examined colon secondary to Crohn's disease with colonic involvement. Biopsied. - The examined portion of the ileum was normal. Biopsied 1. Surgical [P], small bowel, ileum bx - BENIGN SMALL BOWEL MUCOSA WITH NO SIGNIFICANT PATHOLOGIC CHANGES 2. Surgical [P], colon, cecum - CHRONIC ACTIVE COLITIS (SEE NOTE) - NEGATIVE FOR DYSPLASIA OR MALIGNANCY 3. Surgical [P], colon, ascending - CHRONIC ACTIVE COLITIS (SEE NOTE) - NEGATIVE FOR DYSPLASIA OR MALIGNANCY 4. Surgical [P], colon, transverse - CHRONIC ACTIVE COLITIS (SEE NOTE) - NEGATIVE FOR DYSPLASIA OR MALIGNANCY 5. Surgical [P], colon, descending - CHRONIC ACTIVE COLITIS (SEE NOTE) - NEGATIVE FOR DYSPLASIA OR MALIGNANCY 6. Surgical [P], colon, sigmoid - CHRONIC ACTIVE COLITIS (SEE NOTE) - NEGATIVE FOR DYSPLASIA OR MALIGNANCY 7. Surgical [P], colon, rectum - CHRONIC ACTIVE PROCTITIS (SEE NOTE) - NEGATIVE FOR DYSPLASIA OR MALIGNANCY   Imaging:  DG Abd Portable 1V  Result Date: 02/17/2022 CLINICAL DATA:  NG tube placement. EXAM: PORTABLE ABDOMEN - 1 VIEW COMPARISON:  02/10/2022 FINDINGS: An enteric tube is noted with coiled in the proximal-mid stomach. No  other significant changes noted. IMPRESSION: Enteric tube with coiled in the proximal-mid stomach. Electronically Signed   By: Margarette Canada M.D.   On: 02/17/2022 17:21   DG Abd 2 Views  Result Date: 02/17/2022 CLINICAL DATA:  Small bowel obstruction. EXAM: ABDOMEN - 2 VIEW COMPARISON:  CT scan February 15, 2022 FINDINGS: The NG tube has been pulled back. The distal tip is just below the GE junction  and the side port is approximately 6 or 7 cm above the GE junction. Dilated loops of small bowel remain in the lower abdomen. No other abnormalities. IMPRESSION: 1. The NG tube has been pulled back with the side port 6 or 7 cm above the GE junction. Recommend advancement. 2. Dilated loops of small bowel remain consistent with ileus versus small-bowel obstruction. These results will be called to the ordering clinician or representative by the Radiologist Assistant, and communication documented in the PACS or [employer]. Electronically Signed   By: Dorise Bullion III M.D.   On: 02/17/2022 11:46   CT ABDOMEN PELVIS WO CONTRAST  Result Date: 02/15/2022 CLINICAL DATA:  History of prior laparoscopy with colonic resection and colostomy, persistent abdominal pain and distension. EXAM: CT ABDOMEN AND PELVIS WITHOUT CONTRAST TECHNIQUE: Multidetector CT imaging of the abdomen and pelvis was performed following the standard protocol without IV contrast. RADIATION DOSE REDUCTION: This exam was performed according to the departmental dose-optimization program which includes automated exposure control, adjustment of the mA and/or kV according to patient size and/or use of iterative reconstruction technique. COMPARISON:  02/12/2022 FINDINGS: Lower chest: Bibasilar consolidation is noted with small right-sided pleural effusion. Hepatobiliary: Liver is well visualized and within normal limits. Gallbladder demonstrates vicarious excretion of contrast material. Perihepatic fluid is noted similar to that seen on the prior exam. Pancreas: Unremarkable. No pancreatic ductal dilatation or surrounding inflammatory changes. Spleen: Normal in size without focal abnormality. Adrenals/Urinary Tract: Adrenal glands are within normal limits. Kidneys demonstrate no renal calculi or obstructive changes. The bladder is partially distended. Stomach/Bowel: Hartmann's pouch is again identified. Left-sided colostomy is seen. The more proximal  colon is unremarkable. The appendix is within normal limits. Stomach is decompressed by a gastric catheter. Mildly dilated loops of small bowel are noted particularly within the right abdomen somewhat progressed when compared with the prior exam. No transition zone is identified. Vascular/Lymphatic: Aortic atherosclerosis. No enlarged abdominal or pelvic lymph nodes. Reproductive: Prostate is unremarkable. Other: Fluid is noted throughout the abdomen to that seen on the prior exam. This is most prominent along the right pericolic gutter and perihepatic regions although similar to the prior study. Small amount of free fluid and air is seen in the hip paddle renal fossa stable from the prior exam likely related to the prior surgery. No discrete abscess is noted. Musculoskeletal: No acute or significant osseous findings. IMPRESSION: Postsurgical changes as described. Free fluid is noted within the abdomen relatively stable in appearance from the prior exam. No discrete abscess is noted at this time. Multiple mildly dilated loops of small bowel in the right mid abdomen slightly greater than that seen on the prior exam. This is again likely related to a postoperative ileus. Correlate with the physical exam. Right-sided pleural effusion and bibasilar consolidation. Electronically Signed   By: Inez Catalina M.D.   On: 02/15/2022 02:12    Lab Results: Recent Labs    02/16/22 0515 02/17/22 0432 02/18/22 0708  WBC 6.0 4.6 5.7  HGB 7.9* 7.9* 7.8*  HCT 24.4* 24.5* 24.3*  PLT 209  215 241   BMET Recent Labs    02/17/22 0852 02/17/22 1732 02/18/22 0709  NA 131* 129* 132*  K 6.2* 3.8 3.8  CL 100 97* 100  CO2 '27 25 28  '$ GLUCOSE 129* 172* 148*  BUN 9 5* 7*  CREATININE <0.30* 0.37* 0.44*  CALCIUM 6.7* 6.7* 7.1*   LFT No results for input(s): "PROT", "ALBUMIN", "AST", "ALT", "ALKPHOS", "BILITOT", "BILIDIR", "IBILI" in the last 72 hours. PT/INR No results for input(s): "LABPROT", "INR" in the last 72  hours.   Scheduled inpatient medications:   Chlorhexidine Gluconate Cloth  6 each Topical Daily   enoxaparin (LOVENOX) injection  0.5 mg/kg Subcutaneous Daily   insulin aspart  0-20 Units Subcutaneous Q4H   pantoprazole (PROTONIX) IV  40 mg Intravenous Q24H   sodium chloride flush  10-40 mL Intracatheter Q12H   Continuous inpatient infusions:   sodium chloride 133 mL/hr at 02/18/22 0747   chlorproMAZINE (THORAZINE) 12.5 mg in sodium chloride 0.9 % 25 mL IVPB Stopped (02/14/22 0913)   meropenem (MERREM) IV Stopped (02/18/22 0726)   TPN ADULT (ION) 50 mL/hr at 02/18/22 0900   vancomycin Stopped (02/18/22 0230)   PRN inpatient medications: acetaminophen, chlorproMAZINE (THORAZINE) 12.5 mg in sodium chloride 0.9 % 25 mL IVPB, HYDROmorphone (DILAUDID) injection, ondansetron (ZOFRAN) IV, mouth rinse, sodium chloride flush  Vital signs in last 24 hours: Temp:  [97.5 F (36.4 C)-100.3 F (37.9 C)] 98.8 F (37.1 C) (12/04 0732) Pulse Rate:  [102-115] 103 (12/04 0700) Resp:  [10-27] 13 (12/04 0700) BP: (97-143)/(51-73) 121/65 (12/04 0700) SpO2:  [92 %-98 %] 95 % (12/04 0700) Weight:  [115.8 kg] 115.8 kg (12/04 0500) Last BM Date : 02/18/22  Intake/Output Summary (Last 24 hours) at 02/18/2022 0914 Last data filed at 02/18/2022 0857 Gross per 24 hour  Intake 4563.11 ml  Output 3400 ml  Net 1163.11 ml    Intake/Output from previous day: 12/03 0701 - 12/04 0700 In: 2736.9 [I.V.:2176.8; IV Piggyback:560.1] Out: 2400 [Urine:1400; Emesis/NG output:800; Stool:200] Intake/Output this shift: Total I/O In: 1942.2 [P.O.:240; I.V.:1502.2; IV Piggyback:200] Out: 1000 [Urine:450; Emesis/NG output:550]   Physical Exam:  General: Alert male in NAD Heart:  Regular rate and rhythm Pulmonary: Normal respiratory effort Abdomen: Soft, distended, a few bowel sounds.  Neurologic: Alert and oriented Extremities:  1-2 BLE edema Psych: Pleasant. Cooperative.    Principal Problem:   Perforated  abdominal viscus Active Problems:   Protein-calorie malnutrition, severe     LOS: 10 days   Tye Savoy ,NP 02/18/2022, 9:14 AM

## 2022-02-18 NOTE — Progress Notes (Signed)
Physical Therapy Treatment Patient Details Name: Richard [name] MRN: [id number] DOB: [birth year] Today's Date: 02/18/2022   History of Present Illness Patient is a 66 year old male who was admitted on 11/23 with bowel preforation. patient underwent exploratogy lap, sigmoid resection and transverse colostomy. PMH: IBD, crohn's colitis,COVID July 2022, HLD, HTN, OSA, PNA, Seborrheic dermatitis, Hypogonadism, DM type 2, and peripheral neuopathy.    PT Comments    Pt received supine in bed with no complaints other than pain with coughing, reviewed abdominal splinting education as pt reported he was not completing this activity during coughing. Reviewed logroll technique with pt prior to mobility. Pt required mod assist +2 for bed mobility and transfers, pt declining to transfer to recliner secondary to fatigue and 10/10 pain in R knee; attempted to encourage pt to increase OOB mobility but pt adamant. Pt did have one episode of SpO2 at 85% but with poor pleth and responded very quickly with pulsed-lip breathing. Returned pt to supine with max assist +2 and placed pt in chair position in bed and emphasized the importance of ankle pumps and IS every hour. Pt did require less assistance from PT today compared to Friday. We will continue to follow acutely.  Vitals at start of session: HR104, RR20, SpO2 95% (on 3L via [place]), BP 129/69 Vitals at end of session: HR108, RR23, SpO2 92%, BP 125/67.     Recommendations for follow up therapy are one component of a multi-disciplinary discharge planning process, led by the attending physician.  Recommendations may be updated based on patient status, additional functional criteria and insurance authorization.  Follow Up Recommendations  Home health PT     Assistance Recommended at Discharge Frequent or constant Supervision/Assistance  Patient can return home with the following Two people to help with walking and/or transfers;Assistance with cooking/housework;Assist  for transportation;Help with stairs or ramp for entrance;Two people to help with bathing/dressing/bathroom   Equipment Recommendations  Rolling walker (2 wheels)    Recommendations for Other Services OT consult     Precautions / Restrictions Precautions Precautions: Other (comment) Precaution Comments: NG suction, L colostomy,needs O2, Ab Binder Required Braces or Orthoses: Other Brace Other Brace: abdominal binder Restrictions Weight Bearing Restrictions: No     Mobility  Bed Mobility Overal bed mobility: Needs Assistance Bed Mobility: Rolling, Sidelying to Sit, Sit to Sidelying, Sit to Supine Rolling: +2 for physical assistance, +2 for safety/equipment, Mod assist Sidelying to sit: HOB elevated, +2 for safety/equipment, +2 for physical assistance, Mod assist   Sit to supine: +2 for physical assistance, +2 for safety/equipment, Max assist   General bed mobility comments: Re-educated pt on logrolling, pt required min assist to assume logrolling positioning (placing L foot on bed), mod assist +2 for rolling to R side and for trunk elevation. Pt sat EOB and GI came in to evaluate pt; pt sat EOB for >80mn without complaint though did de-sat and required cuing for PLB. Sit to suping required max assist +2 for bringing BLE into bed and lowering trunk, total assist for repositioning in supine.    Transfers Overall transfer level: Needs assistance Equipment used: Rolling walker (2 wheels) Transfers: Sit to/from Stand Sit to Stand: +2 safety/equipment, From elevated surface, +2 physical assistance, Mod assist           General transfer comment: Pt completed sit-to-stand transfer with mod assist +2 from elevated surface though in standing complained of R medial knee pain described as burning. Attempted to have pt complete step pivot transfer to  recliner and pt declined secondary to pain and feeling weak. further mobilityd eferred    Ambulation/Gait               General Gait  Details: deferred   Stairs             Wheelchair Mobility    Modified Rankin (Stroke Patients Only)       Balance Overall balance assessment: Needs assistance Sitting-balance support: Bilateral upper extremity supported, Feet supported Sitting balance-Leahy Scale: Fair     Standing balance support: Bilateral upper extremity supported, During functional activity, Reliant on assistive device for balance Standing balance-Leahy Scale: Poor Standing balance comment: unable                            Cognition Arousal/Alertness: Awake/alert Behavior During Therapy: WFL for tasks assessed/performed Overall Cognitive Status: Within Functional Limits for tasks assessed                                          Exercises Total Joint Exercises Ankle Circles/Pumps: AROM, Both, 10 reps, Supine    General Comments        Pertinent Vitals/Pain Pain Assessment Pain Assessment: 0-10 Pain Score: 10-Worst pain ever Pain Location: R knee in standing Pain Descriptors / Indicators: Burning Pain Intervention(s): Limited activity within patient's tolerance, Monitored during session, Repositioned    Home Living                          Prior Function            PT Goals (current goals can now be found in the care plan section) Acute Rehab PT Goals Patient Stated Goal: to go outside, get out of room. PT Goal Formulation: With patient/family Time For Goal Achievement: 02/25/22 Potential to Achieve Goals: Good Progress towards PT goals: Progressing toward goals    Frequency    Min 3X/week      PT Plan Current plan remains appropriate    Co-evaluation              AM-PAC PT "6 Clicks" Mobility   Outcome Measure  Help needed turning from your back to your side while in a flat bed without using bedrails?: A Lot Help needed moving from lying on your back to sitting on the side of a flat bed without using bedrails?: A  Lot Help needed moving to and from a bed to a chair (including a wheelchair)?: Total Help needed standing up from a chair using your arms (e.g., wheelchair or bedside chair)?: Total Help needed to walk in hospital room?: Total Help needed climbing 3-5 steps with a railing? : Total 6 Click Score: 8    End of Session Equipment Utilized During Treatment: Oxygen;Gait belt (ab binder, 3L via [place]) Activity Tolerance: Patient limited by fatigue;Patient limited by pain Patient left: in bed;with call bell/phone within reach;with bed alarm set;with family/visitor present Nurse Communication: Mobility status PT Visit Diagnosis: Difficulty in walking, not elsewhere classified (R26.2);Pain Pain - Right/Left: Right Pain - part of body: Knee (abdomen)     Time: 9509-3267 PT Time Calculation (min) (ACUTE ONLY): 35 min  Charges:  $Therapeutic Activity: 23-37 mins                     Coolidge Breeze, PT, DPT  [facility] Department Office: [phone] Weekend pager: [phone]   Coolidge Breeze 02/18/2022, 11:21 AM

## 2022-02-18 NOTE — Progress Notes (Signed)
PROGRESS NOTE  Richard [name]  DOB: 10/30/1955  PCP: [name], MD UJW:[id number]  DOA: 02/07/2022  LOS: 10 days  Hospital Day: 12  Brief narrative: Richard [name] is a 66 y.o. male with PMH significant for DM2, HTN, HLD, obesity, OSA, hypogonadism, peripheral neuropathy.  11/7, seen in the office by Dr. [name] with complaint of diarrhea for about 3 to 4 months, 6-8 episodes a day, mixed with blood associated with abdominal cramping and progressive weakness.  Stool studies at that time were negative for infectious etiology.  Sed rate was elevated 72, fecal calprotectin was elevated to 3570. 11/20, underwent colonoscopy, found to have diffuse colitis with deep ulcerations throughout the entire colon friable mucosa as well as extensive mucosal edema in the rectum.  He was started on prednisone 40 mg daily. After colonoscopy, pat ient developed worsening abdominal pain. 11/23, at home patient had 1 episode of syncope.  Also had a fever.  Abdominal pain worsened and hence came to the ED. In the ED he had fever of 102.1 with tachycardia. CT abdomen pelvis showed perforated bowel with moderate free fluid and free air.  Patient was started on IV antibiotics, IV fluid Patient was admitted to ICU. General surgery was consulted 11/24, patient underwent exploratory laparotomy, descending and sigmoid colon resection, transverse colostomy by Dr. [name] See below for details.  Subjective: Patient was seen and examined this morning.  Propped up in bed.  Not in distress.  NG tube clamped this morning by general surgery. Ongoing TPN.  Electrolytes improving. Wife at bedside.  Assessment and plan: Acute peritonitis d/t perforated sigmoid colon Inflammatory bowel disease S/p exploratory laparotomy, descending and sigmoid colon resection, transverse colostomy - 11/24 Sepsis - POA Being followed by GI, general surgery. Blood culture 11/27 with no growth. Pending report of cultures sent on  12/1. No fever spike after antibiotics was broadened to IV meropenem and IV vancomycin on 12/1 surgical pathology report showed transmural inflammation consistent with IBD.  Noted to plan from GI to start on Remicade. Pain management per general surgery.  Currently on as needed IV Dilaudid. Recent Labs  Lab 02/14/22 0500 02/15/22 0559 02/16/22 0515 02/17/22 0432 02/18/22 0708  WBC 8.0 7.2 6.0 4.6 5.7   Postop ileus 11/28, CT abdomen showed mild dilated small bowel loops with scattered air-fluid levels likely postop ileus.  It also showed scattered free fluid in the abdomen pelvis and small amount of free air and free fluid in the hepatorenal fossa without discrete drainable abscess. Postop ileus continues.  General surgery planned NG tube clamping this morning.  Has been started on clear liquid diet.  11/28, PICC line placed and TPN was started on 11/29.  TPN stopped this morning.   Postop respiratory insufficiency 11/28, CT scan showed small bilateral pleural effusion and moderate overlying atelectasis. Incentive spirometry, flutter valve, ambulation as tolerated Currently on 3 L oxygen via nasal cannula. Continue to wean down as tolerated  Hypokalemia/hyperkalemia  Hypomagnesemia/hypophosphatemia Acute metabolic alkalosis 56/21, blood gas showed significant alkalosis with pH 7.6, pCO2 59, serum bicarb 58 This is likely due to gastric secretions causing loss of acid as well as hypovolemia.   Nephrology consult appreciated.  Started on PPI. Aggressively hydrated. Electrolyte management challenging because of poor oral intake, high volume NG tube output and the use of TPN.   Discussed with pharmacy.  Magnesium and phosphorus level low this morning.  Replacement given with adjustment in TPN.  Continue to monitor Recent Labs  Lab 02/14/22 0500  02/14/22 1715 02/15/22 0559 02/15/22 1739 02/16/22 0515 02/16/22 1848 02/16/22 2021 02/17/22 0432 02/17/22 0852 02/17/22 1732  02/18/22 0708 02/18/22 0709  K 2.2*   < > 3.8   < >  --    < > 4.2 4.3 6.2* 3.8  --  3.8  MG 2.0  --  1.6*  --  1.7  --   --  2.0  --   --  1.6*  --   PHOS 1.9*  --  1.8*  --  1.6*  --   --  1.7*  --   --  1.8*  --    < > = values in this interval not displayed.   Type 2 diabetes mellitus A1c 6.6 on 01/23/2022 PTA on metformin, Jardiance Currently on sliding scale insulin with Accu-Cheks every 4 hours.  Continue to monitor.   Recent Labs  Lab 02/17/22 1932 02/17/22 2357 02/18/22 0415 02/18/22 0829 02/18/22 1153  GLUCAP 113* 159* 171* 150* 147*   Impaired mobility Significant weakness.  Multifactorial: Low-level electrolytes, no oral intake, dehydration, physical deconditioning PT eval ordered.  Acute metabolic encephalopathy Patient is slow to respond, clouded and disoriented. Expect mental status to improve with clinical improvement.  H/o Essential HTN lisinopril remains on hold   HLD Lipitor remains on hold  Obesity -Body mass index is 37.7 kg/m. Patient has been advised to make an attempt to improve diet and exercise patterns to aid in weight loss.  OSA Baseline CPAP 10 cm H20, followed by Dr. Ander Slade CPAP QSH   NGT may impede mask seal    Hx of ADHD, Anxiety methylphenidate on hold   Hx of Hypogonadism. Was on outpatient testosterone    Moderate Protein Calorie Malnutrition Dietitian consulted.   Goals of care   Code Status: Full Code    Mobility: PT follow-up needed.  Infusions:   sodium chloride 166 mL/hr at 02/18/22 1218   chlorproMAZINE (THORAZINE) 12.5 mg in sodium chloride 0.9 % 25 mL IVPB Stopped (02/14/22 0913)   meropenem (MERREM) IV Stopped (02/18/22 0726)   sodium phosphate 30 mmol in dextrose 5 % 250 mL infusion 30 mmol (02/18/22 1331)   TPN ADULT (ION) 50 mL/hr at 02/18/22 1106   TPN ADULT (ION)     vancomycin Stopped (02/18/22 0230)    Scheduled Meds:  Chlorhexidine Gluconate Cloth  6 each Topical Daily   enoxaparin (LOVENOX)  injection  0.5 mg/kg Subcutaneous Daily   insulin aspart  0-20 Units Subcutaneous Q4H   pantoprazole (PROTONIX) IV  40 mg Intravenous Q24H   sodium chloride flush  10-40 mL Intracatheter Q12H    PRN meds: acetaminophen, chlorproMAZINE (THORAZINE) 12.5 mg in sodium chloride 0.9 % 25 mL IVPB, HYDROmorphone (DILAUDID) injection, ondansetron (ZOFRAN) IV, mouth rinse, sodium chloride flush   Skin assessment:     Nutritional status:  Body mass index is 37.7 kg/m.  Nutrition Problem: Severe Malnutrition Etiology: acute illness (new dx Crohn's colitis; bowel perforation) Signs/Symptoms: moderate fat depletion, moderate muscle depletion, percent weight loss Percent weight loss: 10 % (in 4 months)     Diet:  Diet Order             Diet clear liquid Room service appropriate? Yes with Assist; Fluid consistency: Thin  Diet effective now                   DVT prophylaxis:  Place and maintain sequential compression device Start: 02/08/22 0001   Antimicrobials: IV meropenem and IV vancomycin to continue Fluid:  TPN per general surgery.   Consultants: General surgery, nephrology Family Communication: Wife at bedside  Status is: Inpatient  Continue in-hospital care because: Gradually improving postop ileus. Level of care: ICU   Dispo: The patient is from: Home              Anticipated d/c is to: Pending clinical course              Patient currently is not medically stable to d/c.   Difficult to place patient No       Antimicrobials: Anti-infectives (From admission, onward)    Start     Dose/Rate Route Frequency Ordered Stop   02/15/22 1400  meropenem (MERREM) 1 g in sodium chloride 0.9 % 100 mL IVPB        1 g 200 mL/hr over 30 Minutes Intravenous Every 8 hours 02/15/22 1244     02/15/22 1400  vancomycin (VANCOCIN) IVPB 1000 mg/200 mL premix        1,000 mg 200 mL/hr over 60 Minutes Intravenous Every 12 hours 02/15/22 1304     02/08/22 1200  piperacillin-tazobactam  (ZOSYN) IVPB 3.375 g  Status:  Discontinued        3.375 g 12.5 mL/hr over 240 Minutes Intravenous Every 8 hours 02/08/22 0505 02/15/22 1243   02/08/22 0500  piperacillin-tazobactam (ZOSYN) IVPB 3.375 g  Status:  Discontinued        3.375 g 12.5 mL/hr over 240 Minutes Intravenous Every 8 hours 02/08/22 0006 02/08/22 0505   02/08/22 0334  piperacillin-tazobactam (ZOSYN) 3.375 GM/50ML IVPB       Note to Pharmacy: Enis Gash W: cabinet override      02/08/22 0334 02/08/22 0345   02/07/22 2030  ceFEPIme (MAXIPIME) 2 g in sodium chloride 0.9 % 100 mL IVPB        2 g 200 mL/hr over 30 Minutes Intravenous  Once 02/07/22 2023 02/07/22 2248   02/07/22 2030  metroNIDAZOLE (FLAGYL) IVPB 500 mg        500 mg 100 mL/hr over 60 Minutes Intravenous  Once 02/07/22 2023 02/08/22 0506   02/07/22 2030  vancomycin (VANCOCIN) IVPB 1000 mg/200 mL premix        1,000 mg 200 mL/hr over 60 Minutes Intravenous  Once 02/07/22 2023 02/08/22 0506       Objective: Vitals:   02/18/22 1100 02/18/22 1300  BP:    Pulse: (!) 106 (!) 105  Resp: 18 (!) 24  Temp:    SpO2: 94% 95%    Intake/Output Summary (Last 24 hours) at 02/18/2022 1332 Last data filed at 02/18/2022 1106 Gross per 24 hour  Intake 5103.57 ml  Output 3300 ml  Net 1803.57 ml   Filed Weights   02/12/22 1801 02/13/22 0436 02/18/22 0500  Weight: 105 kg 104.5 kg 115.8 kg   Weight change:  Body mass index is 37.7 kg/m.   Physical Exam: General exam: Pleasant elderly Caucasian male.  Opens eyes and verbal command.  Able to follow simple commands and answer simple questions Skin: No rashes, lesions or ulcers. HEENT: Atraumatic, normocephalic, no obvious bleeding.  NG tube remains clamped. Lungs: Diminished air entry in both bases.  No crackles or wheezing. CVS: Regular rate and rhythm, no murmur GI/Abd soft, mild postop appropriate tenderness, bowel sounds minimal.  Ostomy bag with liquid output. CNS: Eyes open, slow to answer questions.   Oriented to place. Psychiatry: Sad affect Extremities: No pedal edema, no calf tenderness  Data Review: I have personally reviewed  the laboratory data and studies available.  F/u labs ordered Unresulted Labs (From admission, onward)     Start     Ordered   02/25/22 0500  Triglycerides  (TPN Lab Panel)  Every Monday (0500),   R     Question:  Specimen collection method  Answer:  Unit=Unit collect   02/18/22 1037   02/21/22 0500  Comprehensive metabolic panel  (TPN Lab Panel)  Every Mon,Thu (0500),   R     Question:  Specimen collection method  Answer:  Unit=Unit collect   02/18/22 1037   02/21/22 0500  Magnesium  (TPN Lab Panel)  Every Mon,Thu (0500),   R     Question:  Specimen collection method  Answer:  Unit=Unit collect   02/18/22 1037   02/21/22 0500  Phosphorus  (TPN Lab Panel)  Every Mon,Thu (0500),   R     Question:  Specimen collection method  Answer:  Unit=Unit collect   02/18/22 1037   02/19/22 0500  Comprehensive metabolic panel  (TPN Lab Panel)  Tomorrow morning,   R       Question:  Specimen collection method  Answer:  Unit=Unit collect   02/18/22 1037   02/16/22 2952  Basic metabolic panel  2 times daily,   R     Question:  Specimen collection method  Answer:  Unit=Unit collect   02/16/22 0735   02/15/22 1303  MRSA Next Gen by PCR, Nasal  (MRSA Screening)  Once,   R        02/15/22 1302   02/15/22 1234  Expectorated Sputum Assessment w Gram Stain, Rflx to Resp Cult  Once,   R        02/15/22 1233   02/14/22 0500  Triglycerides  (TPN Lab Panel)  Every Mon,Thu (0500),   R     Question:  Specimen collection method  Answer:  Lab=Lab collect   02/12/22 1801   02/13/22 0500  CBC with Differential/Platelet  Daily at 5am,   R     Question:  Specimen collection method  Answer:  Lab=Lab collect   02/12/22 0927   02/13/22 0500  Magnesium  Daily at 5am,   R     Question:  Specimen collection method  Answer:  Lab=Lab collect   02/12/22 0927   02/13/22 0500  Phosphorus  Daily  at 5am,   R     Question:  Specimen collection method  Answer:  Lab=Lab collect   02/12/22 0927   02/11/22 1800  Acid Fast Culture with reflexed sensitivities  (AFB smear + Culture w reflexed sensitivities panel)  3 times daily,   R,   Status:  Canceled     See Hyperspace for full Linked Orders Report.   02/11/22 1544            Signed, Terrilee Croak, MD [facility] 02/18/2022

## 2022-02-18 NOTE — Progress Notes (Signed)
Nutrition Follow-up  DOCUMENTATION CODES:   Severe malnutrition in context of acute illness/injury  INTERVENTION:  - Per pharmacy: TPN tonight at 41m/hr: will provide 85 (0.8g/kg), 201.6g CHO, 360 lipid calories = 1385 calories (13kcal/kg)   - TPN management per pharmacy.    - Monitor magnesium, potassium, and phosphorus BID for at least 3 days, MD to replete as needed, as pt is at risk for refeeding syndrome given malnutrition, significant weight loss of 10% in the past 4 months, inadequate intake for several weeks PTA.   - Due to risk of refeeding and unstable electrolytes, would consider increasing by only 200-300 calories each day if able to advance. - Continue '100mg'$  thiamine daily in TPN due to risk of refeeding risk.   - Clear Liquid diet per MD.  - Once tolerating clear liquids and able to further advance diet, recommend nutrition supplements to support oral intake.  - Monitor weights daily while on TPN.   NUTRITION DIAGNOSIS:   Severe Malnutrition related to acute illness (new dx Crohn's colitis; bowel perforation) as evidenced by moderate fat depletion, moderate muscle depletion, percent weight loss. *ongoing  GOAL:   Patient will meet greater than or equal to 90% of their needs *on TPN, slowly advancing to goal  MONITOR:   Labs, Weight trends, Diet advancement  REASON FOR ASSESSMENT:   Consult New TPN/TNA  ASSESSMENT:   66y/o male with PMH HTN, HLD, DMII, diverticulosis, and newly diagnosed Crohn's colitis who developed diarrhea about 3 to 4 weeks prior to admission and presented 11/24 with worsening abdominal pain, found to have bowel perforation.  1/24 admit, S/p exploratory lap, mobilization of the splenic flexure, descending and sigmoid colon resection and transverse colostomy; NG placed 11/26 having bowel function from ostomy, NG removed; clear liquid diet             Later in day patient had N/V, NG reinserted and 2L bilious fluid removed 11/28 Failed  NG clamp trial, PICC placed-plan to start TPN 11/29 TPN started 11/30 TPN continued at same rate due to refeeding/electrolytes not stable 12/2 TPN stopped in evening d/t hyperkalemia, D10W ordered 12/3 TPN resumed 12/4 TPN continued, increased; clear liquid diet started  Current TPN at 519mhr: will provide 70.8 (0.7g/kg), 168g CHO, 300 lipid calories = 1154 calories (11kcal/kg)  TPN had to be stopped evening of 12/2 due to hyperkalemia. Restarted 12/3. Plan to increase slightly today, patient remains at high risk for refeeding due to malnutrition. Discussed TPN with Pharmacist.  Labs reviewed. Potassium WNL but Phosphorus and Magnesium low this AM. Replacements orders in for this afternoon. Thiamine in TPN to aid in macronutrient metabolism. Triglycerides WNL.  Patient now having bowel function. Per Surgery note today, plan to start clear liquid diet and do 6 hour NG clamp trial. If patient does well, NG to be removed.  Per discussion with Pharmacist, plan to continue TPN at this time until patient able to take ~60% of more of needs via PO intake. Would want to see patient on regular/soft diet prior to fully assessing PO intake as it will be very hard for patient to meet calorie and protein goals while on clear liquid/full liquid diet.    Medications reviewed and include: Insulin  Labs reviewed:  Na 132 Phos 1.8 Mag 1.6 Triglycerides 116   Diet Order:   Diet Order             Diet clear liquid Room service appropriate? Yes with Assist; Fluid consistency: Thin  Diet effective  now                   EDUCATION NEEDS:  Education needs have been addressed  Skin:  Skin Assessment: Reviewed RN Assessment  Last BM:  12/4  Height:  Ht Readings from Last 1 Encounters:  02/08/22 '5\' 9"'$  (1.753 m)   Weight:  Wt Readings from Last 1 Encounters:  02/18/22 115.8 kg   BMI:  Body mass index is 37.7 kg/m.  Estimated Nutritional Needs:  Kcal:  6151-8343 kcals Protein:  135-155  grams Fluid:  >/= 2.3L    Samson Frederic RD, LDN For contact information, refer to [facility].

## 2022-02-18 NOTE — Progress Notes (Signed)
Patient NG clamed this morning and clear diet ordered per provider. Patient had at most 120cc of liquids at this time but stated it made him a little nauseous. Encouraged patient to try some chicken broth or other fluids at lunch but he said he didn't feel up to it. I have continued IV fluids per nephrology order. PT abdomen remains very distended, taut, and has hypoactive bowel sounds. Due to patient nausea, continued discomfort while trying to take clear liquids I put his NG back to LIS per provider order, removed clear diet, and resumed NPO diet with ice chips. PT said after doing so he felt better and oxygenation also increased.

## 2022-02-18 NOTE — Progress Notes (Signed)
Pharmacy Antibiotic Note  Richard [name] is a 66 y.o. male admitted on 02/07/2022 with sepsis.  New diagnosis of IBD, acute sigmoid perforation s/p exp laparoscopy, left colectomy and transverse colostomy.  Has been on Zosyn since 11/24 but had ongoing fever, lower grade tachycardia, and distention that per RN and wife seems worse.  Attending (who discussed with surgery) has consulted Pharmacy to switch antibiotics to meropenem and vancomycin for acute peritonitis d/t perforated sigmoid colon, IBD, and possible developing pneumonia.    MRSA PCR :not detected Afebrile, WBC wnl Remains on 2L [place]  Plan: Meropenem 1 g IV every 8 hours D/c vancomycin  Follow up renal function, culture results, and clinical course.    Height: '5\' 9"'$  (175.3 cm) Weight: 115.8 kg (255 lb 4.7 oz) IBW/kg (Calculated) : 70.7  Temp (24hrs), Avg:98.8 F (37.1 C), Min:97.5 F (36.4 C), Max:100.3 F (37.9 C)  Recent Labs  Lab 02/14/22 0500 02/14/22 1715 02/15/22 0559 02/15/22 1739 02/16/22 0515 02/16/22 1848 02/16/22 2021 02/17/22 0432 02/17/22 0852 02/17/22 1732 02/18/22 0708 02/18/22 0709  WBC 8.0  --  7.2  --  6.0  --   --  4.6  --   --  5.7  --   CREATININE 0.48*   < > 0.42*   < >  --    < > 0.34* <0.30* <0.30* 0.37*  --  0.44*   < > = values in this interval not displayed.     Estimated Creatinine Clearance: 115.5 mL/min (A) (by C-G formula based on SCr of 0.44 mg/dL (L)).    Allergies  Allergen Reactions   Lobster [Shellfish Allergy] Nausea Only   Poison Ivy Extract Itching    Antimicrobials this admission: 11/23 Flagyl, cefepime, vancomycin x 1 11/24 Zosyn >> 12/1 12/1 meropenem >> 12/1 vancomycin >> 12/4  Microbiology results: 12/1 BCx: ngtd 11/30 AFB: Negative  11/27 Bcx: NGF 11/25 UCx: ngf  11/24 MRSA PCR: not detected 11/23 Bcx: NGF 11/23 UCx: 1000 colonies S.epidermidis 12/4 MRSA PCR: not detected   Thank you for allowing pharmacy to be a part of this patient's  care.  Gretta Arab PharmD, BCPS [facility] main pharmacy [phone] 02/18/2022 12:07 PM

## 2022-02-18 NOTE — Progress Notes (Signed)
11 Days Post-Op   Subjective/Chief Complaint: Some NG output.  But also having bowel function.  Feels bloated   Objective: Vital signs in last 24 hours: Temp:  [97.5 F (36.4 C)-100.3 F (37.9 C)] 98.8 F (37.1 C) (12/04 0732) Pulse Rate:  [101-115] 103 (12/04 0700) Resp:  [10-27] 13 (12/04 0700) BP: (97-143)/(51-73) 121/65 (12/04 0700) SpO2:  [90 %-98 %] 95 % (12/04 0700) Weight:  [115.8 kg] 115.8 kg (12/04 0500) Last BM Date : 02/17/22  Intake/Output from previous day: 12/03 0701 - 12/04 0700 In: 2736.9 [I.V.:2176.8; IV Piggyback:560.1] Out: 2400 [Urine:1400; Emesis/NG output:800; Stool:200] Intake/Output this shift: Total I/O In: 1702.2 [I.V.:1502.2; IV Piggyback:200] Out: -   General appearance: alert and cooperative Resp: rhonchi bilaterally Cardio: regular rate and rhythm GI: soft, mild tenderness. Ostomy pink and productive  Lab Results:  Recent Labs    02/17/22 0432 02/18/22 0708  WBC 4.6 5.7  HGB 7.9* 7.8*  HCT 24.5* 24.3*  PLT 215 241    BMET Recent Labs    02/17/22 1732 02/18/22 0709  NA 129* 132*  K 3.8 3.8  CL 97* 100  CO2 25 28  GLUCOSE 172* 148*  BUN 5* 7*  CREATININE 0.37* 0.44*  CALCIUM 6.7* 7.1*    PT/INR No results for input(s): "LABPROT", "INR" in the last 72 hours. ABG No results for input(s): "PHART", "HCO3" in the last 72 hours.  Invalid input(s): "PCO2", "PO2"   Studies/Results: DG Abd Portable 1V  Result Date: 02/17/2022 CLINICAL DATA:  NG tube placement. EXAM: PORTABLE ABDOMEN - 1 VIEW COMPARISON:  02/10/2022 FINDINGS: An enteric tube is noted with coiled in the proximal-mid stomach. No other significant changes noted. IMPRESSION: Enteric tube with coiled in the proximal-mid stomach. Electronically Signed   By: Margarette Canada M.D.   On: 02/17/2022 17:21   DG Abd 2 Views  Result Date: 02/17/2022 CLINICAL DATA:  Small bowel obstruction. EXAM: ABDOMEN - 2 VIEW COMPARISON:  CT scan February 15, 2022 FINDINGS: The NG tube has  been pulled back. The distal tip is just below the GE junction and the side port is approximately 6 or 7 cm above the GE junction. Dilated loops of small bowel remain in the lower abdomen. No other abnormalities. IMPRESSION: 1. The NG tube has been pulled back with the side port 6 or 7 cm above the GE junction. Recommend advancement. 2. Dilated loops of small bowel remain consistent with ileus versus small-bowel obstruction. These results will be called to the ordering clinician or representative by the Radiologist Assistant, and communication documented in the PACS or [employer]. Electronically Signed   By: Dorise Bullion III M.D.   On: 02/17/2022 11:46    Anti-infectives: Anti-infectives (From admission, onward)    Start     Dose/Rate Route Frequency Ordered Stop   02/15/22 1400  meropenem (MERREM) 1 g in sodium chloride 0.9 % 100 mL IVPB        1 g 200 mL/hr over 30 Minutes Intravenous Every 8 hours 02/15/22 1244     02/15/22 1400  vancomycin (VANCOCIN) IVPB 1000 mg/200 mL premix        1,000 mg 200 mL/hr over 60 Minutes Intravenous Every 12 hours 02/15/22 1304     02/08/22 1200  piperacillin-tazobactam (ZOSYN) IVPB 3.375 g  Status:  Discontinued        3.375 g 12.5 mL/hr over 240 Minutes Intravenous Every 8 hours 02/08/22 0505 02/15/22 1243   02/08/22 0500  piperacillin-tazobactam (ZOSYN) IVPB 3.375 g  Status:  Discontinued        3.375 g 12.5 mL/hr over 240 Minutes Intravenous Every 8 hours 02/08/22 0006 02/08/22 0505   02/08/22 0334  piperacillin-tazobactam (ZOSYN) 3.375 GM/50ML IVPB       Note to Pharmacy: Enis Gash W: cabinet override      02/08/22 0334 02/08/22 0345   02/07/22 2030  ceFEPIme (MAXIPIME) 2 g in sodium chloride 0.9 % 100 mL IVPB        2 g 200 mL/hr over 30 Minutes Intravenous  Once 02/07/22 2023 02/07/22 2248   02/07/22 2030  metroNIDAZOLE (FLAGYL) IVPB 500 mg        500 mg 100 mL/hr over 60 Minutes Intravenous  Once 02/07/22 2023 02/08/22 0506    02/07/22 2030  vancomycin (VANCOCIN) IVPB 1000 mg/200 mL premix        1,000 mg 200 mL/hr over 60 Minutes Intravenous  Once 02/07/22 2023 02/08/22 0506       Assessment/Plan: s/p Procedure(s): EXPLORATORY LAPAROTOMY, COLON RESECTION, OSTOMY (N/A) Will check abd xrays today. If improved then will consider d/c ng and starting clears EXPLORATORY LAPAROTOMY, COLON RESECTION, OSTOMY 02/07/2022 Dr. [name] POD#9  Clamp NG for 6 hours and allow clear liquids then remove if he does well Okay for steroids to treat inflammatory bowel disease if necessary per GI recommendations - I wonder if the inflammation is the cause of his symptoms Increase activity   FEN - NG tube clamp trial today, TPN VTE - SCDs, lovenox ID - [email or web address] 11/24 -> 11/29    LOS: 10 days    Richard [name] 02/18/2022

## 2022-02-18 NOTE — Progress Notes (Signed)
PHARMACY - TOTAL PARENTERAL NUTRITION CONSULT NOTE   Indication: Prolonged ileus  Patient Measurements: Height: '5\' 9"'$  (175.3 cm) Weight: 115.8 kg (255 lb 4.7 oz) IBW/kg (Calculated) : 70.7 TPN AdjBW (KG): 80.3 Body mass index is 37.7 kg/m. Usual Weight:   Assessment: 66 yo M s/p colonic perforation status post exploratory lap, partial colectomy and transverse colostomy 02/08/2022.  PMHx Scrotal abscess August 2023, Colon polyp, Diverticulosis, ADHD, Anxiety, Rhinitis, COVID July 2022, HLD, HTN, OSA, PNA, Seborrheic dermatitis, Hypogonadism, DM type 2, Peripheral neuropathy, former smoker.   Pharmacy consulted to manage TPN for prolonged ileus.    Glucose / Insulin: hx DM2 on metformin, Jardiance PTA - Goal CBG < 180. CBGs 101-171 - rSSI used/ 24 hrs: 15 units Electrolytes: Na remains low at 132, K down to 3.8, (K removed from TPN on 12/3), Phos low at 1.8 (NaPhos 30 mmol on 12/3), Mag 1.6 - TPN held 12/2 at 2030 - 12/3 at 1800.  D10 @ 50 ml/hr. - Goal K > 4, Mag > 2 for ileus Renal: SCr <1, BUN low.  Adequate UOP.  Hepatic: LFT WNL, Trig 116, Alb 1.5 Intake / Output; I/O -120 ml/ 24 hrs NG output up to 828m/24 hrs. Stool 2091m24 hrs. UOP 19501m4 hrs.  - RN reports ongoing ileus, with poor BS.  Trial NG tube clamping with clears.  - MIVF: 0.45% NaCl @ 10, stopped 12/4 GI Imaging:  11/28 CT A/P: post op ileus GI Surgeries / Procedures:  02/08/22:  colonic perforation status post exploratory lap, partial colectomy and transverse colostomy   Central access: PICC placed 11/28 for TPN TPN start date: 02/13/22  Nutritional Goals: Goal TPN rate is 100 mL/hr to provide 142 g of protein and 2390 kcals per day  RD Assessment: Estimated Needs Total Energy Estimated Needs: 2300-2600 kcals Total Protein Estimated Needs: 135-155 grams Total Fluid Estimated Needs: >/= 2.3L  Current Nutrition:  NPO and Clear liquids  Plan:  Now:   NaPhos 30 mMol IV x 1 Magnesium 2g bolus x1    At 1800: Advance to TPN at 60 ml/hr Electrolytes in TPN: Na 150 mEq/L, K 40 mEq/L, Ca 5mE28m, Mg 6 mEq/L, Phos 25 mmol/L. Cl:Ac 1:1 Add standard MVI and trace elements to TPN Add thiamine to TPN  Continue Resistant q4h SSI and adjust as needed  mIVF per MD  Monitor TPN labs on Mon/Thurs and PRN.    Thank you for allowing pharmacy to be a part of this patient's care.  ChriGretta ArabrmD, BCPS [facility] main pharmacy [phone] 10984/2023 8:12 AM

## 2022-02-18 NOTE — Progress Notes (Signed)
Ngt remains in place. CPAP remains on hold tonight

## 2022-02-19 ENCOUNTER — Inpatient Hospital Stay (HOSPITAL_COMMUNITY): Payer: 59

## 2022-02-19 DIAGNOSIS — K9189 Other postprocedural complications and disorders of digestive system: Secondary | ICD-10-CM | POA: Diagnosis present

## 2022-02-19 DIAGNOSIS — K567 Ileus, unspecified: Secondary | ICD-10-CM | POA: Diagnosis not present

## 2022-02-19 DIAGNOSIS — R198 Other specified symptoms and signs involving the digestive system and abdomen: Secondary | ICD-10-CM | POA: Diagnosis not present

## 2022-02-19 DIAGNOSIS — K50119 Crohn's disease of large intestine with unspecified complications: Secondary | ICD-10-CM | POA: Diagnosis not present

## 2022-02-19 LAB — COMPREHENSIVE METABOLIC PANEL
ALT: 12 U/L (ref 0–44)
AST: 22 U/L (ref 15–41)
Albumin: <1.5 g/dL — ABNORMAL LOW (ref 3.5–5.0)
Alkaline Phosphatase: 63 U/L (ref 38–126)
Anion gap: 4 — ABNORMAL LOW (ref 5–15)
BUN: 6 mg/dL — ABNORMAL LOW (ref 8–23)
CO2: 28 mmol/L (ref 22–32)
Calcium: 7.1 mg/dL — ABNORMAL LOW (ref 8.9–10.3)
Chloride: 101 mmol/L (ref 98–111)
Creatinine, Ser: 0.33 mg/dL — ABNORMAL LOW (ref 0.61–1.24)
GFR, Estimated: >60 mL/min (ref >60)
Glucose, Bld: 154 mg/dL — ABNORMAL HIGH (ref 70–99)
Potassium: 3.6 mmol/L (ref 3.5–5.1)
Sodium: 133 mmol/L — ABNORMAL LOW (ref 135–145)
Total Bilirubin: 0.4 mg/dL (ref 0.3–1.2)
Total Protein: 5.6 g/dL — ABNORMAL LOW (ref 6.5–8.1)

## 2022-02-19 LAB — CBC WITH DIFFERENTIAL/PLATELET
Abs Immature Granulocytes: 0.06 10*3/uL (ref 0.00–0.07)
Basophils Absolute: 0 10*3/uL (ref 0.0–0.1)
Basophils Relative: 0 %
Eosinophils Absolute: 0.1 10*3/uL (ref 0.0–0.5)
Eosinophils Relative: 2 %
HCT: 23.7 % — ABNORMAL LOW (ref 39.0–52.0)
Hemoglobin: 7.6 g/dL — ABNORMAL LOW (ref 13.0–17.0)
Immature Granulocytes: 1 %
Lymphocytes Relative: 10 %
Lymphs Abs: 0.5 10*3/uL — ABNORMAL LOW (ref 0.7–4.0)
MCH: 33.2 pg (ref 26.0–34.0)
MCHC: 32.1 g/dL (ref 30.0–36.0)
MCV: 103.5 fL — ABNORMAL HIGH (ref 80.0–100.0)
Monocytes Absolute: 0.3 10*3/uL (ref 0.1–1.0)
Monocytes Relative: 7 %
Neutro Abs: 4.1 10*3/uL (ref 1.7–7.7)
Neutrophils Relative %: 80 %
Platelets: 255 10*3/uL (ref 150–400)
RBC: 2.29 MIL/uL — ABNORMAL LOW (ref 4.22–5.81)
RDW: 14 % (ref 11.5–15.5)
WBC: 5.2 10*3/uL (ref 4.0–10.5)
nRBC: 0 % (ref 0.0–0.2)

## 2022-02-19 LAB — BASIC METABOLIC PANEL
Anion gap: 5 (ref 5–15)
BUN: 6 mg/dL — ABNORMAL LOW (ref 8–23)
CO2: 29 mmol/L (ref 22–32)
Calcium: 7 mg/dL — ABNORMAL LOW (ref 8.9–10.3)
Chloride: 103 mmol/L (ref 98–111)
Creatinine, Ser: 0.3 mg/dL — ABNORMAL LOW (ref 0.61–1.24)
GFR, Estimated: >60 mL/min (ref >60)
Glucose, Bld: 145 mg/dL — ABNORMAL HIGH (ref 70–99)
Potassium: 3.5 mmol/L (ref 3.5–5.1)
Sodium: 137 mmol/L (ref 135–145)

## 2022-02-19 LAB — GLUCOSE, CAPILLARY
Glucose-Capillary: 128 mg/dL — ABNORMAL HIGH (ref 70–99)
Glucose-Capillary: 129 mg/dL — ABNORMAL HIGH (ref 70–99)
Glucose-Capillary: 138 mg/dL — ABNORMAL HIGH (ref 70–99)
Glucose-Capillary: 145 mg/dL — ABNORMAL HIGH (ref 70–99)
Glucose-Capillary: 154 mg/dL — ABNORMAL HIGH (ref 70–99)
Glucose-Capillary: 168 mg/dL — ABNORMAL HIGH (ref 70–99)

## 2022-02-19 LAB — PHOSPHORUS: Phosphorus: 2.1 mg/dL — ABNORMAL LOW (ref 2.5–4.6)

## 2022-02-19 LAB — MAGNESIUM: Magnesium: 1.9 mg/dL (ref 1.7–2.4)

## 2022-02-19 MED ORDER — TRAVASOL 10 % IV SOLN
INTRAVENOUS | Status: AC
  Filled 2022-02-19: qty 1062

## 2022-02-19 MED ORDER — IOHEXOL 300 MG/ML  SOLN
100.0000 mL | Freq: Once | INTRAMUSCULAR | Status: AC | PRN
  Administered 2022-02-19: 100 mL via INTRAVENOUS

## 2022-02-19 MED ORDER — SODIUM PHOSPHATES 45 MMOLE/15ML IV SOLN
30.0000 mmol | Freq: Once | INTRAVENOUS | Status: AC
  Administered 2022-02-19: 30 mmol via INTRAVENOUS
  Filled 2022-02-19: qty 10

## 2022-02-19 MED ORDER — IOHEXOL 9 MG/ML PO SOLN
ORAL | Status: AC
  Administered 2022-02-19: 500 mL
  Filled 2022-02-19: qty 1000

## 2022-02-19 MED ORDER — MAGNESIUM SULFATE 2 GM/50ML IV SOLN
2.0000 g | Freq: Once | INTRAVENOUS | Status: AC
  Administered 2022-02-19: 2 g via INTRAVENOUS
  Filled 2022-02-19: qty 50

## 2022-02-19 MED ORDER — IOHEXOL 9 MG/ML PO SOLN
500.0000 mL | ORAL | Status: AC
  Administered 2022-02-19 (×2): 500 mL via ORAL

## 2022-02-19 MED ORDER — SODIUM CHLORIDE (PF) 0.9 % IJ SOLN
INTRAMUSCULAR | Status: AC
  Filled 2022-02-19: qty 50

## 2022-02-19 MED ORDER — METOCLOPRAMIDE HCL 5 MG/ML IJ SOLN
10.0000 mg | Freq: Four times a day (QID) | INTRAMUSCULAR | Status: DC
  Administered 2022-02-19 – 2022-02-21 (×8): 10 mg via INTRAVENOUS
  Filled 2022-02-19 (×8): qty 2

## 2022-02-19 NOTE — Progress Notes (Signed)
PHARMACY - TOTAL PARENTERAL NUTRITION CONSULT NOTE   Indication: Prolonged ileus  Patient Measurements: Height: '5\' 9"'$  (175.3 cm) Weight: 116.1 kg (255 lb 15.3 oz) IBW/kg (Calculated) : 70.7 TPN AdjBW (KG): 80.3 Body mass index is 37.8 kg/m. Usual Weight:   Assessment: 66 yo M s/p colonic perforation status post exploratory lap, partial colectomy and transverse colostomy 02/08/2022.  PMHx Scrotal abscess August 2023, Colon polyp, Diverticulosis, ADHD, Anxiety, Rhinitis, COVID July 2022, HLD, HTN, OSA, PNA, Seborrheic dermatitis, Hypogonadism, DM type 2, Peripheral neuropathy, former smoker.   Pharmacy consulted to manage TPN for prolonged ileus.    Glucose / Insulin: hx DM2 on metformin, Jardiance PTA - Goal CBG < 180. CBGs 129-163 - rSSI used/ 24 hrs: 21 units Electrolytes: Na remains low at 133 (max TPN conc), K 3.6, Phos low at 2.1 (max TPN conc. Outside of TPN:  NaPhos 30 mmol on 12/3, 12/4, 12/5), Mag 1.9, CorrCa 9.1 - TPN held 12/2 at 2030 - 12/3 at 1800.  D10 @ 50 ml/hr. - Goal K > 4, Mag > 2 for ileus Renal: SCr <1, BUN low.  Adequate UOP.  Hepatic: LFTs, Tbili WNL, Trig 116, Alb <1.5 Intake / Output; I/O +3.2 L/ 24 hrs - NG output up to 2200 mL/24 hrs. Stool 180 mL/24 hrs. UOP 3100 mL/24 hrs.  - 12/4 failed trial NG tube clamping with clears.  - MIVF: 0.45% NaCl @ 10, stopped 12/4 GI Imaging:  11/28 CT A/P: post op ileus GI Surgeries / Procedures:  02/08/22:  colonic perforation status post exploratory lap, partial colectomy and transverse colostomy   Central access: PICC placed 11/28 for TPN TPN start date: 02/13/22  Nutritional Goals: Goal TPN rate is 100 mL/hr to provide 142 g of protein and 2390 kcals per day  RD Assessment: Estimated Needs Total Energy Estimated Needs: 2300-2600 kcals Total Protein Estimated Needs: 135-155 grams Total Fluid Estimated Needs: >/= 2.3L  Current Nutrition:  NPO and Clear liquids  Plan:  Now:   NaPhos 30 mMol IV x  1 Magnesium 2g bolus x1   At 1800: Advance to TPN at 75 ml/hr Electrolytes in TPN: Na 154 mEq/L, K 50 mEq/L, Ca 39mq/L, Mg 6 mEq/L, Phos 25 mmol/L. Cl:Ac 1:1 Add standard MVI and trace elements to TPN Add thiamine to TPN  Continue Resistant q4h SSI and adjust as needed  mIVF per MD  Monitor TPN labs on Mon/Thurs and PRN.    Thank you for allowing pharmacy to be a part of this patient's care.  CGretta ArabPharmD, BCPS [facility] main pharmacy [phone]/07/2021 8:22 AM

## 2022-02-19 NOTE — Progress Notes (Signed)
PROGRESS NOTE  Richard [name]  DOB: [birth year]  PCP: [name], MD NOB:[id number]  DOA: 02/07/2022  LOS: 11 days  Hospital Day: 13  Brief narrative: Richard [name] is a 66 y.o. male with PMH significant for DM2, HTN, HLD, obesity, OSA, hypogonadism, peripheral neuropathy.  11/7, seen in the office by Dr. [name] with complaint of diarrhea for about 3 to 4 months, 6-8 episodes a day, mixed with blood associated with abdominal cramping and progressive weakness.  Stool studies at that time were negative for infectious etiology.  Sed rate was elevated 72, fecal calprotectin was elevated to 3570. 11/20, underwent colonoscopy, found to have diffuse colitis with deep ulcerations throughout the entire colon friable mucosa as well as extensive mucosal edema in the rectum.  He was started on prednisone 40 mg daily. After colonoscopy, pat ient developed worsening abdominal pain. 11/23, at home patient had 1 episode of syncope.  Also had a fever.  Abdominal pain worsened and hence came to the ED. In the ED he had fever of 102.1 with tachycardia. CT abdomen pelvis showed perforated bowel with moderate free fluid and free air.  Patient was started on IV antibiotics, IV fluid Patient was admitted to ICU. General surgery was consulted 11/24, patient underwent exploratory laparotomy, descending and sigmoid colon resection, transverse colostomy by Dr. [name] 11/28, CT abdomen showed mild dilated small bowel loops with scattered air-fluid levels likely postop ileus.  It also showed scattered free fluid in the abdomen pelvis and small amount of free air and free fluid in the hepatorenal fossa without discrete drainable abscess.  Postop ileus has not resolved yet..   See below for details.  Subjective: Patient was seen and examined this morning.  Propped up in bed.  NG tube clamping was tried yesterday.  Patient became nauseated.  Only was able to take minimal amount of clear liquid.  Had  significant NG tube output again this morning.  Noted a plan from general surgery to repeat CT abdomen today. Wife at bedside.  Assessment and plan: Acute peritonitis d/t perforated sigmoid colon Inflammatory bowel disease S/p exploratory laparotomy, descending and sigmoid colon resection, transverse colostomy - 11/24 Sepsis - POA Being followed by GI, general surgery. Blood culture 11/27 with no growth. Pending report of cultures sent on 12/1. No fever spike after antibiotics was broadened to IV meropenem and IV vancomycin on 12/1 surgical pathology report showed transmural inflammation consistent with IBD.  Noted a plan from GI to start on Remicade. Pain management per general surgery.  Currently on as needed IV Dilaudid. Recent Labs  Lab 02/15/22 0559 02/16/22 0515 02/17/22 0432 02/18/22 0708 02/19/22 0453  WBC 7.2 6.0 4.6 5.7 5.2   Postop ileus NG tube clamping trial done yesterday.  Patient became nauseous.  Only able to take small amount of clear liquid.  Significant NG tube output again this morning.  Noted plan from general surgery to repeat CT abdomen pelvis today. 11/28, PICC line placed and TPN was started on 11/29.  TPN stopped this morning.   Postop respiratory insufficiency 11/28, CT scan showed small bilateral pleural effusion and moderate overlying atelectasis. Incentive spirometry, flutter valve, ambulation as tolerated Currently on 3 L oxygen via nasal cannula. Continue to wean down as tolerated  Hypokalemia/hyperkalemia  Hypomagnesemia/hypophosphatemia Acute metabolic alkalosis  94/76, blood gas showed significant alkalosis with pH 7.6, pCO2 59, serum bicarb 58 This is likely due to gastric secretions causing loss of acid as well as hypovolemia.   Nephrology consult appreciated.  Started on PPI. Aggressively hydrated. Electrolyte management challenging because of poor oral intake, high volume NG tube output and the use of TPN.   Phosphorus low this morning.   Replacement by pharmacy. Recent Labs  Lab 02/15/22 0559 02/15/22 1739 02/16/22 0515 02/16/22 1848 02/17/22 0432 02/17/22 0852 02/17/22 1732 02/18/22 0708 02/18/22 0709 02/19/22 0453  K 3.8   < >  --    < > 4.3 6.2* 3.8  --  3.8 3.6  MG 1.6*  --  1.7  --  2.0  --   --  1.6*  --  1.9  PHOS 1.8*  --  1.6*  --  1.7*  --   --  1.8*  --  2.1*   < > = values in this interval not displayed.   Type 2 diabetes mellitus A1c 6.6 on 01/23/2022 PTA on metformin, Jardiance Currently on sliding scale insulin with Accu-Cheks every 4 hours.  Continue to monitor.   Recent Labs  Lab 02/18/22 2001 02/18/22 2346 02/19/22 0448 02/19/22 0838 02/19/22 1223  GLUCAP 161* 129* 154* 128* 129*   Moderate Protein Calorie Malnutrition Albumin significantly low.  Dietitian consulted.  Bilateral lower EXTR edema Related to third spacing.  Albumin significantly low.  Acute metabolic encephalopathy Patient is slow to respond, clouded and disoriented. Expect mental status to improve with clinical improvement.  Impaired mobility Significant weakness.  Multifactorial: Low-level electrolytes, no oral intake, dehydration, physical deconditioning PT eval ordered.  H/o Essential HTN lisinopril remains on hold   HLD Lipitor remains on hold  Obesity -Body mass index is 37.8 kg/m. Patient has been advised to make an attempt to improve diet and exercise patterns to aid in weight loss.  OSA Baseline CPAP 10 cm H20, followed by Dr. Ander Slade CPAP QSH   NGT may impede mask seal    Hx of ADHD, Anxiety methylphenidate on hold   Hx of Hypogonadism. Was on outpatient testosterone       Goals of care   Code Status: Full Code    Mobility: PT follow-up needed.  Infusions:   sodium chloride 266 mL/hr at 02/19/22 1230   chlorproMAZINE (THORAZINE) 12.5 mg in sodium chloride 0.9 % 25 mL IVPB Stopped (02/14/22 0913)   meropenem (MERREM) IV Stopped (02/19/22 0630)   sodium phosphate 30 mmol in dextrose 5  % 250 mL infusion 30 mmol (02/19/22 1208)   TPN ADULT (ION) 60 mL/hr at 02/19/22 0630   TPN ADULT (ION)      Scheduled Meds:  Chlorhexidine Gluconate Cloth  6 each Topical Daily   enoxaparin (LOVENOX) injection  0.5 mg/kg Subcutaneous Daily   insulin aspart  0-20 Units Subcutaneous Q4H   iohexol       metoCLOPramide (REGLAN) injection  10 mg Intravenous Q6H   pantoprazole (PROTONIX) IV  40 mg Intravenous Q24H   sodium chloride flush  10-40 mL Intracatheter Q12H    PRN meds: acetaminophen, chlorproMAZINE (THORAZINE) 12.5 mg in sodium chloride 0.9 % 25 mL IVPB, HYDROmorphone (DILAUDID) injection, iohexol, ondansetron (ZOFRAN) IV, mouth rinse, sodium chloride flush   Skin assessment:     Nutritional status:  Body mass index is 37.8 kg/m.  Nutrition Problem: Severe Malnutrition Etiology: acute illness (new dx Crohn's colitis; bowel perforation) Signs/Symptoms: moderate fat depletion, moderate muscle depletion, percent weight loss Percent weight loss: 10 % (in 4 months)     Diet:  Diet Order             Diet NPO time specified Except for: Ice Chips  Diet effective now                   DVT prophylaxis:  Place and maintain sequential compression device Start: 02/08/22 0001   Antimicrobials: IV meropenem and IV vancomycin to continue Fluid: TPN per general surgery.   Consultants: General surgery, nephrology Family Communication: Wife at bedside  Status is: Inpatient  Continue in-hospital care because: Gradually improving postop ileus. Level of care: ICU   Dispo: The patient is from: Home              Anticipated d/c is to: Pending clinical course              Patient currently is not medically stable to d/c.   Difficult to place patient No       Antimicrobials: Anti-infectives (From admission, onward)    Start     Dose/Rate Route Frequency Ordered Stop   02/15/22 1400  meropenem (MERREM) 1 g in sodium chloride 0.9 % 100 mL IVPB        1 g 200 mL/hr  over 30 Minutes Intravenous Every 8 hours 02/15/22 1244     02/15/22 1400  vancomycin (VANCOCIN) IVPB 1000 mg/200 mL premix  Status:  Discontinued        1,000 mg 200 mL/hr over 60 Minutes Intravenous Every 12 hours 02/15/22 1304 02/18/22 1445   02/08/22 1200  piperacillin-tazobactam (ZOSYN) IVPB 3.375 g  Status:  Discontinued        3.375 g 12.5 mL/hr over 240 Minutes Intravenous Every 8 hours 02/08/22 0505 02/15/22 1243   02/08/22 0500  piperacillin-tazobactam (ZOSYN) IVPB 3.375 g  Status:  Discontinued        3.375 g 12.5 mL/hr over 240 Minutes Intravenous Every 8 hours 02/08/22 0006 02/08/22 0505   02/08/22 0334  piperacillin-tazobactam (ZOSYN) 3.375 GM/50ML IVPB       Note to Pharmacy: Enis Gash W: cabinet override      02/08/22 0334 02/08/22 0345   02/07/22 2030  ceFEPIme (MAXIPIME) 2 g in sodium chloride 0.9 % 100 mL IVPB        2 g 200 mL/hr over 30 Minutes Intravenous  Once 02/07/22 2023 02/07/22 2248   02/07/22 2030  metroNIDAZOLE (FLAGYL) IVPB 500 mg        500 mg 100 mL/hr over 60 Minutes Intravenous  Once 02/07/22 2023 02/08/22 0506   02/07/22 2030  vancomycin (VANCOCIN) IVPB 1000 mg/200 mL premix        1,000 mg 200 mL/hr over 60 Minutes Intravenous  Once 02/07/22 2023 02/08/22 0506       Objective: Vitals:   02/19/22 1100 02/19/22 1200  BP:    Pulse: (!) 105 (!) 103  Resp: 15 (!) 23  Temp:  99.4 F (37.4 C)  SpO2: 93% 95%    Intake/Output Summary (Last 24 hours) at 02/19/2022 1339 Last data filed at 02/19/2022 1200 Gross per 24 hour  Intake 6241.02 ml  Output 5840 ml  Net 401.02 ml   Filed Weights   02/13/22 0436 02/18/22 0500 02/19/22 0400  Weight: 104.5 kg 115.8 kg 116.1 kg   Weight change: 0.3 kg Body mass index is 37.8 kg/m.   Physical Exam: General exam: Pleasant elderly Caucasian male.  Opens eyes and verbal command.  Able to follow simple commands and answer simple questions Skin: No rashes, lesions or ulcers. HEENT: Atraumatic,  normocephalic, no obvious bleeding.  NG tube remains clamped. Lungs: Diminished air entry in both bases.  No crackles  or wheezing. CVS: Regular rate and rhythm, no murmur GI/Abd soft, mild postop appropriate tenderness, bowel sounds minimal.  Ostomy bag with liquid output. CNS: Eyes open, slow to answer questions.  Oriented to place. Psychiatry: Sad affect Extremities: 2+ bilateral pedal edema, no calf tenderness  Data Review: I have personally reviewed the laboratory data and studies available.  F/u labs ordered Unresulted Labs (From admission, onward)     Start     Ordered   02/21/22 0500  Comprehensive metabolic panel  (TPN Lab Panel)  Every Mon,Thu (0500),   R     Question:  Specimen collection method  Answer:  Unit=Unit collect   02/18/22 1037   02/21/22 0500  Magnesium  (TPN Lab Panel)  Every Mon,Thu (0500),   R     Question:  Specimen collection method  Answer:  Unit=Unit collect   02/18/22 1037   02/21/22 0500  Phosphorus  (TPN Lab Panel)  Every Mon,Thu (0500),   R     Question:  Specimen collection method  Answer:  Unit=Unit collect   02/18/22 1037   02/20/22 9767  Basic metabolic panel  Tomorrow morning,   R       Question:  Specimen collection method  Answer:  Unit=Unit collect   02/19/22 0836   02/16/22 3419  Basic metabolic panel  2 times daily,   R     Question:  Specimen collection method  Answer:  Unit=Unit collect   02/16/22 0735   02/15/22 1234  Expectorated Sputum Assessment w Gram Stain, Rflx to Resp Cult  Once,   R        02/15/22 1233   02/14/22 0500  Triglycerides  (TPN Lab Panel)  Every Mon,Thu (0500),   R     Question:  Specimen collection method  Answer:  Lab=Lab collect   02/12/22 1801   02/13/22 0500  CBC with Differential/Platelet  Daily,   R     Question:  Specimen collection method  Answer:  Lab=Lab collect   02/12/22 0927   02/13/22 0500  Magnesium  Daily,   R     Question:  Specimen collection method  Answer:  Lab=Lab collect   02/12/22 0927    02/13/22 0500  Phosphorus  Daily,   R     Question:  Specimen collection method  Answer:  Lab=Lab collect   02/12/22 0927   02/11/22 1800  Acid Fast Culture with reflexed sensitivities  (AFB smear + Culture w reflexed sensitivities panel)  3 times daily,   R,   Status:  Canceled     See Hyperspace for full Linked Orders Report.   02/11/22 1544            Signed, Terrilee Croak, MD [facility] 02/19/2022

## 2022-02-19 NOTE — Progress Notes (Addendum)
[facility] Progress Note  CC:   IBD, bowel perforation   Subjective:  Feels about the same.  Small amounts of stool in colostomy bag, but seems to be lessening per his nurse.  Objective:  Vital signs in last 24 hours: Temp:  [98.7 F (37.1 C)-100 F (37.8 C)] 98.7 F (37.1 C) (12/05 0800) Pulse Rate:  [102-107] 107 (12/05 0900) Resp:  [14-26] 23 (12/05 0900) BP: (114-145)/(58-69) 119/61 (12/05 0800) SpO2:  [87 %-97 %] 95 % (12/05 0900) Weight:  [116.1 kg] 116.1 kg (12/05 0400) Last BM Date : 02/18/22 General:  Alert, Well-developed, in NAD Heart:  Regular rate and rhythm; no murmurs Pulm:  CTAB.  No W/R/R. Abdomen:  Soft, distended.  Appropriate TTP.  Colostomy noted. Extremities:  Without edema. Neurologic:  Alert and  oriented x4;  grossly normal neurologically. Psych:  Alert and cooperative. Normal mood and affect.  Intake/Output from previous day: 12/04 0701 - 12/05 0700 In: 8723.7 [P.O.:660; I.V.:7242.7; IV Piggyback:621] Out: 7858 [Urine:3110; Emesis/NG output:2200; Stool:180] Intake/Output this shift: Total I/O In: -  Out: 1100 [Urine:800; Emesis/NG output:300]  Lab Results: Recent Labs    02/17/22 0432 02/18/22 0708 02/19/22 0453  WBC 4.6 5.7 5.2  HGB 7.9* 7.8* 7.6*  HCT 24.5* 24.3* 23.7*  PLT 215 241 255   BMET Recent Labs    02/17/22 1732 02/18/22 0709 02/19/22 0453  NA 129* 132* 133*  K 3.8 3.8 3.6  CL 97* 100 101  CO2 '25 28 28  '$ GLUCOSE 172* 148* 154*  BUN 5* 7* 6*  CREATININE 0.37* 0.44* 0.33*  CALCIUM 6.7* 7.1* 7.1*   LFT Recent Labs    02/19/22 0453  PROT 5.6*  ALBUMIN <1.5*  AST 22  ALT 12  ALKPHOS 63  BILITOT 0.4   DG Abd Portable 1V  Result Date: 02/19/2022 CLINICAL DATA:  Ileus. EXAM: PORTABLE ABDOMEN - 1 VIEW COMPARISON:  02/17/2022 FINDINGS: Nasogastric tube unchanged as it is coiled once over the stomach with tip over the gastric fundus in the left upper quadrant. Air is present throughout the colon.  There are multiple air-filled dilated small bowel loops measuring up to 4.4 cm over the right lower quadrant. No evidence of free peritoneal air. Degenerative changes of the spine and hips. IMPRESSION: Multiple air-filled dilated small bowel loops measuring up to 4.4 cm over the right lower quadrant. Findings may be due to ileus versus early/partial small bowel obstruction. Electronically Signed   By: Marin Olp M.D.   On: 02/19/2022 08:13   DG Abd Portable 1V  Result Date: 02/17/2022 CLINICAL DATA:  NG tube placement. EXAM: PORTABLE ABDOMEN - 1 VIEW COMPARISON:  02/10/2022 FINDINGS: An enteric tube is noted with coiled in the proximal-mid stomach. No other significant changes noted. IMPRESSION: Enteric tube with coiled in the proximal-mid stomach. Electronically Signed   By: Margarette Canada M.D.   On: 02/17/2022 17:21   DG Abd 2 Views  Result Date: 02/17/2022 CLINICAL DATA:  Small bowel obstruction. EXAM: ABDOMEN - 2 VIEW COMPARISON:  CT scan February 15, 2022 FINDINGS: The NG tube has been pulled back. The distal tip is just below the GE junction and the side port is approximately 6 or 7 cm above the GE junction. Dilated loops of small bowel remain in the lower abdomen. No other abnormalities. IMPRESSION: 1. The NG tube has been pulled back with the side port 6 or 7 cm above the GE junction. Recommend advancement. 2. Dilated loops of small bowel remain  consistent with ileus versus small-bowel obstruction. These results will be called to the ordering clinician or representative by the Radiologist Assistant, and communication documented in the PACS or [employer]. Electronically Signed   By: Dorise Bullion III M.D.   On: 02/17/2022 11:46    Assessment / Plan: #66 yo male with newly diagnosed IBD (non-specific IBD with active chronic and no granulomas on biopsies. Admitted with bowel bowel perforation s/p exploratory lap, sigmoid resection, and transverse colostomy on 11/23.  CTAP w/o contrast on 12/1  (done for fever and increasing abdominal pain) was negative for any intra-abdominal abscess, did show post-op ileus.  We have held off on starting steroids pending infectious workup (and likely will interfere with/delay healing). Blood cultures negative.    # Malnutrition, getting TNA    # Macrocytic anemia.  No active bleeding. Hgb stable at 7.6 grams. Elevated B12 level   # Post-op ileus. CT scan 12/1>>Multiple mildly dilated loops of small bowel in the right mid abdomen slightly greater than that seen on the prior exam.  Abdominal x-ray this AM shows persistent ileus.   # Metabolic alkalosis, resolved. Nephrology has been following.   Plan is to start Remicade in 4-6 weeks if ok with surgery. Of note, Quantiferon Gold was indeterminate but AFB negative x3.  Felt nauseous and only took minimal PO with NGT clamped so it is still in place. Looks like QTC ok on repeat EKG.  Will start Reglan 10 mg every 6 hours. Surgery has ordered a repeat CT scan for today with PO and IV contrast.   LOS: 11 days   Laban Emperor. Zehr  02/19/2022, 10:06 AM  GI Attending:  Still not doing well - unable to tolerate clamped NG tube Adding reglan - await CT  Gatha Mayer, MD, [facility] See [name] on call - gastroenterology for best contact person 02/19/2022 2:49 PM

## 2022-02-19 NOTE — Consult Note (Signed)
[place] Nurse ostomy follow up Stoma type/location: LUQ transverse colostomy Stomal assessment/size: 2 and 1/4 inches round, raised, red, edematous Peristomal assessment: intact with 1cm purple discoloration at skin junction. Wife reports it is slightly improved (lighter) over last viewing. Treatment options for stomal/peristomal skin: skin barrier ring Output  Ostomy pouching: 1pc./2pc.  Education provided: Patient remains critically ill and in ICU. He is awake, but does not engage. Wife is emotional today. Teaching to continue with greater depth and patient engagement as he progresses.  I talk through steps of pouch change and teach the way to assess for a good seal upon application of a new pouching system I.e., to wait for the condensation to appear on the front of the newly applied pouching system. Patient's wife is able to perform Lock and Roll closure mechanism.  Enrolled patient in [place] Start Discharge program: Yes  Next pouch change is scheduled for Friday, 12/8. Supplies in room include 3 pouches, 3 skin barriers and 3 skin barrier rings.  [place] nursing team will follow, and will remain available to this patient, the nursing and medical teams.    Thank you for inviting [place] to participate in this patient's Plan of Care.  Maudie Flakes, MSN, RN, CNS, [place], Serita Grammes, [employer], [employer] phone:  [phone]

## 2022-02-19 NOTE — Progress Notes (Signed)
OT Cancellation Note  Patient Details Name: Richard [name] MRN: [id number] DOB: Apr 18, 1955   Cancelled Treatment:    Reason Eval/Treat Not Completed: Patient declined, no reason specified Patient declined to participate in session at this time. Patient was educated on benefits and importance of movement. Patient verbalized understanding and continued to decline. OT to continue to follow and check back on 12/6.  Rennie Plowman, MS Acute Rehabilitation Department Office# [phone]  02/19/2022, 2:16 PM

## 2022-02-19 NOTE — Progress Notes (Signed)
Progress Note  12 Days Post-Op  Subjective: Wife at bedside this AM. Patient became nauseated yesterday with clamping and minimal intake of clears. Having some ostomy output. Trying to mobilize but it is difficult.   Objective: Vital signs in last 24 hours: Temp:  [98.7 F (37.1 C)-100 F (37.8 C)] 98.7 F (37.1 C) (12/05 0800) Pulse Rate:  [102-116] 106 (12/05 0600) Resp:  [14-24] 19 (12/05 0600) BP: (114-145)/(58-69) 114/69 (12/05 0600) SpO2:  [92 %-97 %] 92 % (12/05 0600) Weight:  [116.1 kg] 116.1 kg (12/05 0400) Last BM Date : 02/18/22  Intake/Output from previous day: 12/04 0701 - 12/05 0700 In: 8723.7 [P.O.:660; I.V.:7242.7; IV Piggyback:621] Out: 5490 [Urine:3110; Emesis/NG output:2200; Stool:180] Intake/Output this shift: No intake/output data recorded.  PE: General: pleasant, WD, obese malewho is laying in bed in NAD Heart: regular, rate, and rhythm.   Lungs:Respiratory effort nonlabored Abd: soft, appropriately ttp, moderately distended, midline wound with some eschar centrally but overall healthy with beefy red granulation tissue, ostomy with some ulcerations but mostly viable with small amount liquid stool in bag, NGT with bilious fluid    Lab Results:  Recent Labs    02/18/22 0708 02/19/22 0453  WBC 5.7 5.2  HGB 7.8* 7.6*  HCT 24.3* 23.7*  PLT 241 255   BMET Recent Labs    02/18/22 0709 02/19/22 0453  NA 132* 133*  K 3.8 3.6  CL 100 101  CO2 28 28  GLUCOSE 148* 154*  BUN 7* 6*  CREATININE 0.44* 0.33*  CALCIUM 7.1* 7.1*   PT/INR No results for input(s): "LABPROT", "INR" in the last 72 hours. CMP     Component Value Date/Time   NA 133 (L) 02/19/2022 0453   K 3.6 02/19/2022 0453   CL 101 02/19/2022 0453   CO2 28 02/19/2022 0453   GLUCOSE 154 (H) 02/19/2022 0453   BUN 6 (L) 02/19/2022 0453   CREATININE 0.33 (L) 02/19/2022 0453   CREATININE 0.63 (L) 03/10/2019 1436   CALCIUM 7.1 (L) 02/19/2022 0453   PROT 5.6 (L) 02/19/2022 0453    ALBUMIN <1.5 (L) 02/19/2022 0453   AST 22 02/19/2022 0453   ALT 12 02/19/2022 0453   ALKPHOS 63 02/19/2022 0453   BILITOT 0.4 02/19/2022 0453   GFRNONAA >60 02/19/2022 0453   Lipase  No results found for: "LIPASE"     Studies/Results: DG Abd Portable 1V  Result Date: 02/19/2022 CLINICAL DATA:  Ileus. EXAM: PORTABLE ABDOMEN - 1 VIEW COMPARISON:  02/17/2022 FINDINGS: Nasogastric tube unchanged as it is coiled once over the stomach with tip over the gastric fundus in the left upper quadrant. Air is present throughout the colon. There are multiple air-filled dilated small bowel loops measuring up to 4.4 cm over the right lower quadrant. No evidence of free peritoneal air. Degenerative changes of the spine and hips. IMPRESSION: Multiple air-filled dilated small bowel loops measuring up to 4.4 cm over the right lower quadrant. Findings may be due to ileus versus early/partial small bowel obstruction. Electronically Signed   By: Marin Olp M.D.   On: 02/19/2022 08:13   DG Abd Portable 1V  Result Date: 02/17/2022 CLINICAL DATA:  NG tube placement. EXAM: PORTABLE ABDOMEN - 1 VIEW COMPARISON:  02/10/2022 FINDINGS: An enteric tube is noted with coiled in the proximal-mid stomach. No other significant changes noted. IMPRESSION: Enteric tube with coiled in the proximal-mid stomach. Electronically Signed   By: Margarette Canada M.D.   On: 02/17/2022 17:21   DG Abd 2 Views  Result Date: 02/17/2022 CLINICAL DATA:  Small bowel obstruction. EXAM: ABDOMEN - 2 VIEW COMPARISON:  CT scan February 15, 2022 FINDINGS: The NG tube has been pulled back. The distal tip is just below the GE junction and the side port is approximately 6 or 7 cm above the GE junction. Dilated loops of small bowel remain in the lower abdomen. No other abnormalities. IMPRESSION: 1. The NG tube has been pulled back with the side port 6 or 7 cm above the GE junction. Recommend advancement. 2. Dilated loops of small bowel remain consistent with  ileus versus small-bowel obstruction. These results will be called to the ordering clinician or representative by the Radiologist Assistant, and communication documented in the PACS or [employer]. Electronically Signed   By: Dorise Bullion III M.D.   On: 02/17/2022 11:46    Anti-infectives: Anti-infectives (From admission, onward)    Start     Dose/Rate Route Frequency Ordered Stop   02/15/22 1400  meropenem (MERREM) 1 g in sodium chloride 0.9 % 100 mL IVPB        1 g 200 mL/hr over 30 Minutes Intravenous Every 8 hours 02/15/22 1244     02/15/22 1400  vancomycin (VANCOCIN) IVPB 1000 mg/200 mL premix  Status:  Discontinued        1,000 mg 200 mL/hr over 60 Minutes Intravenous Every 12 hours 02/15/22 1304 02/18/22 1445   02/08/22 1200  piperacillin-tazobactam (ZOSYN) IVPB 3.375 g  Status:  Discontinued        3.375 g 12.5 mL/hr over 240 Minutes Intravenous Every 8 hours 02/08/22 0505 02/15/22 1243   02/08/22 0500  piperacillin-tazobactam (ZOSYN) IVPB 3.375 g  Status:  Discontinued        3.375 g 12.5 mL/hr over 240 Minutes Intravenous Every 8 hours 02/08/22 0006 02/08/22 0505   02/08/22 0334  piperacillin-tazobactam (ZOSYN) 3.375 GM/50ML IVPB       Note to Pharmacy: Enis Gash W: cabinet override      02/08/22 0334 02/08/22 0345   02/07/22 2030  ceFEPIme (MAXIPIME) 2 g in sodium chloride 0.9 % 100 mL IVPB        2 g 200 mL/hr over 30 Minutes Intravenous  Once 02/07/22 2023 02/07/22 2248   02/07/22 2030  metroNIDAZOLE (FLAGYL) IVPB 500 mg        500 mg 100 mL/hr over 60 Minutes Intravenous  Once 02/07/22 2023 02/08/22 0506   02/07/22 2030  vancomycin (VANCOCIN) IVPB 1000 mg/200 mL premix        1,000 mg 200 mL/hr over 60 Minutes Intravenous  Once 02/07/22 2023 02/08/22 0506        Assessment/Plan  Sigmoid perforation  S/P exploratory laparotomy, colon resection, end colostomy 02/07/22 Dr. [name] - persistent ileus and did not tolerate NGT clamping trial yesterday - no  leukocytosis but persistent low grade fevers - last CT 12/1 with some free fluid but no organized collection  - GI following as well for IBD, fairly new diagnosis - have been holding on steroids pending infectious workup - repeat CT AP today with PO and IV contrast to r/o IAA as source of fevers and ileus - continue TPN/abx  FEN: NPO, TPN, NGT to LIWS VTE: LMWH ID: vanc/flagyl/cefepime 11/23; zosyn 11/24>11/29; vanc 12/1>12/4; merrem 12/1>>   - below per TRH -  Post-operative respiratory insufficiency T2DM Physical deconditioning  Acute metabolic encephalopathy, improving  HTN HLD Obesity class I OSA Hypogonadism Moderate protein calorie malnutrition  ADHD/Anxiety  LOS: 11 days  Norm Parcel, [facility] Surgery 02/19/2022, 9:37 AM Please see Amion for pager number during day hours 7:00am-4:30pm

## 2022-02-19 NOTE — Progress Notes (Signed)
Ngt no longer indwelling. Patient declines nocturnal CPAP tonight. He has asked that his family have the opportunity to bring his home unit to him for use tomorrow night. He is aware that he may ask for assistance at any time, should he change his mind and wish to use a hospital supplied machine tonight. RT will continue to follow and encourage use.

## 2022-02-20 ENCOUNTER — Ambulatory Visit: Payer: 59 | Admitting: Gastroenterology

## 2022-02-20 DIAGNOSIS — K567 Ileus, unspecified: Secondary | ICD-10-CM | POA: Diagnosis not present

## 2022-02-20 DIAGNOSIS — K50119 Crohn's disease of large intestine with unspecified complications: Secondary | ICD-10-CM | POA: Diagnosis not present

## 2022-02-20 DIAGNOSIS — R198 Other specified symptoms and signs involving the digestive system and abdomen: Secondary | ICD-10-CM | POA: Diagnosis not present

## 2022-02-20 DIAGNOSIS — K9189 Other postprocedural complications and disorders of digestive system: Secondary | ICD-10-CM | POA: Diagnosis not present

## 2022-02-20 LAB — CULTURE, BLOOD (ROUTINE X 2)
Culture: NO GROWTH
Culture: NO GROWTH

## 2022-02-20 LAB — CBC WITH DIFFERENTIAL/PLATELET
Abs Immature Granulocytes: 0.05 10*3/uL (ref 0.00–0.07)
Basophils Absolute: 0 10*3/uL (ref 0.0–0.1)
Basophils Relative: 0 %
Eosinophils Absolute: 0.1 10*3/uL (ref 0.0–0.5)
Eosinophils Relative: 3 %
HCT: 23.9 % — ABNORMAL LOW (ref 39.0–52.0)
Hemoglobin: 7.5 g/dL — ABNORMAL LOW (ref 13.0–17.0)
Immature Granulocytes: 1 %
Lymphocytes Relative: 10 %
Lymphs Abs: 0.5 10*3/uL — ABNORMAL LOW (ref 0.7–4.0)
MCH: 32.8 pg (ref 26.0–34.0)
MCHC: 31.4 g/dL (ref 30.0–36.0)
MCV: 104.4 fL — ABNORMAL HIGH (ref 80.0–100.0)
Monocytes Absolute: 0.4 10*3/uL (ref 0.1–1.0)
Monocytes Relative: 8 %
Neutro Abs: 3.9 10*3/uL (ref 1.7–7.7)
Neutrophils Relative %: 78 %
Platelets: 257 10*3/uL (ref 150–400)
RBC: 2.29 MIL/uL — ABNORMAL LOW (ref 4.22–5.81)
RDW: 14.3 % (ref 11.5–15.5)
WBC: 5 10*3/uL (ref 4.0–10.5)
nRBC: 0 % (ref 0.0–0.2)

## 2022-02-20 LAB — GLUCOSE, CAPILLARY
Glucose-Capillary: 159 mg/dL — ABNORMAL HIGH (ref 70–99)
Glucose-Capillary: 154 mg/dL — ABNORMAL HIGH (ref 70–99)
Glucose-Capillary: 142 mg/dL — ABNORMAL HIGH (ref 70–99)
Glucose-Capillary: 194 mg/dL — ABNORMAL HIGH (ref 70–99)
Glucose-Capillary: 161 mg/dL — ABNORMAL HIGH (ref 70–99)
Glucose-Capillary: 158 mg/dL — ABNORMAL HIGH (ref 70–99)

## 2022-02-20 LAB — BASIC METABOLIC PANEL
Anion gap: 5 (ref 5–15)
BUN: 8 mg/dL (ref 8–23)
CO2: 28 mmol/L (ref 22–32)
Calcium: 7.4 mg/dL — ABNORMAL LOW (ref 8.9–10.3)
Chloride: 103 mmol/L (ref 98–111)
Creatinine, Ser: <0.3 mg/dL — ABNORMAL LOW (ref 0.61–1.24)
Glucose, Bld: 169 mg/dL — ABNORMAL HIGH (ref 70–99)
Potassium: 3.9 mmol/L (ref 3.5–5.1)
Sodium: 136 mmol/L (ref 135–145)

## 2022-02-20 LAB — MAGNESIUM: Magnesium: 1.8 mg/dL (ref 1.7–2.4)

## 2022-02-20 LAB — PHOSPHORUS: Phosphorus: 2.4 mg/dL — ABNORMAL LOW (ref 2.5–4.6)

## 2022-02-20 MED ORDER — POTASSIUM PHOSPHATES 15 MMOLE/5ML IV SOLN
15.0000 mmol | Freq: Once | INTRAVENOUS | Status: AC
  Administered 2022-02-20: 15 mmol via INTRAVENOUS
  Filled 2022-02-20: qty 5

## 2022-02-20 MED ORDER — ACETAMINOPHEN 160 MG/5ML PO SOLN
650.0000 mg | Freq: Four times a day (QID) | ORAL | Status: DC | PRN
  Administered 2022-02-20 – 2022-02-22 (×6): 650 mg via ORAL
  Filled 2022-02-20 (×6): qty 20.3

## 2022-02-20 MED ORDER — MAGNESIUM SULFATE 2 GM/50ML IV SOLN
2.0000 g | Freq: Once | INTRAVENOUS | Status: AC
  Administered 2022-02-20: 2 g via INTRAVENOUS
  Filled 2022-02-20: qty 50

## 2022-02-20 MED ORDER — TRAVASOL 10 % IV SOLN
INTRAVENOUS | Status: AC
  Filled 2022-02-20: qty 1062

## 2022-02-20 NOTE — Progress Notes (Signed)
Inpatient Rehab Admissions Coordinator:   Per therapy recommendations, patient was screened for CIR candidacy by Panzy Bubeck, MS, CCC-SLP. At this time, Pt. is not yet at a level to tolerate the intensity of CIR; however,   Pt. may have potential to progress to becoming a potential CIR candidate, so CIR admissions team will follow and monitor for progress and participation with therapies and place consult order if Pt. appears to be an appropriate candidate. Please contact me with any questions.   Aeson Sawyers, MS, CCC-SLP Rehab Admissions Coordinator  [phone] (celll) [phone] (office)  

## 2022-02-20 NOTE — Progress Notes (Signed)
Physical Therapy Treatment Patient Details Name: Richard [name] MRN: [id number] DOB: [birth year] Today's Date: 02/20/2022   History of Present Illness Patient is a 66 year old male who was admitted on 11/23 with bowel preforation. patient underwent exploratogy lap, sigmoid resection and transverse colostomy. PMH: IBD, crohn's colitis,COVID July 2022, HLD, HTN, OSA, PNA, Seborrheic dermatitis, Hypogonadism, DM type 2, and peripheral neuopathy.    PT Comments    Pt received in recliner with RN in room preparing to mobilize back to bed; pt able to sit up in recliner for 2.5 hours this morning and in good spirits, joking with RN, PT, and family member in room. Pt required max assist +2 with RN providing additional assist for transfers, limited by pain in BLE. Required mod assist for bed mobility via logroll technique to return to bed and total assist for repositioning in supine. Pt completed ankle pumps and SCDs reapplied, emphasized hourly completion of ankle pumps and incentive spirometry, pt verbalized understanding. 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 We will continue to follow acutely.      Recommendations for follow up therapy are one component of a multi-disciplinary discharge planning process, led by the attending physician.  Recommendations may be updated based on patient status, additional functional criteria and insurance authorization.  Follow Up Recommendations  Acute inpatient rehab (3hours/day)     Assistance Recommended at Discharge Frequent or constant Supervision/Assistance  Patient can return home with the following Two people to help with walking and/or transfers;Assistance with cooking/housework;Assist for transportation;Help with stairs or ramp for entrance;Two  people to help with bathing/dressing/bathroom   Equipment Recommendations  Rolling walker (2 wheels)    Recommendations for Other Services OT consult     Precautions / Restrictions Precautions Precautions: Other (comment) Precaution Comments: L colostomy, abd binder Required Braces or Orthoses: Other Brace Other Brace: abdominal binder Restrictions Weight Bearing Restrictions: No     Mobility  Bed Mobility Overal bed mobility: Needs Assistance Bed Mobility: Sit to Sidelying, Rolling Rolling: Min assist       Sit to sidelying: Mod assist, +2 for safety/equipment General bed mobility comments: Pt required mod assist to bring BLE into bed but completed rolling with only min assist. Pt unable to adjust hips in bed so total assist +2 for repositioning in supine with chuck pad    Transfers Overall transfer level: Needs assistance Equipment used: Rolling walker (2 wheels) Transfers: Sit to/from Stand, Bed to chair/wheelchair/BSC Sit to Stand: Max assist, From elevated surface, +2 safety/equipment   Step pivot transfers: Max assist, +2 safety/equipment       General transfer comment: Pt required max assist with +2 for safety from built-up recliner surface; required three attempts of transfer secondary to BLE pain. On third attempt, pt successful in fully upright standing with VCs for pushing up off recliner armest and using RW handgrips. Step Pivot: Max assist +2 for steadying and AD management, level of assist is appropriate as halfway through step pivot transfer pt reported "I have to sit right now" so RN and PT had to quickly assist pt to sitting position EOB. VCs for sequencing. Further mobility deferred    Ambulation/Gait               General Gait Details: deferred   Stairs  Wheelchair Mobility    Modified Rankin (Stroke Patients Only)       Balance Overall balance assessment: Needs assistance Sitting-balance support: No upper extremity  supported, Feet supported Sitting balance-Leahy Scale: Fair     Standing balance support: Bilateral upper extremity supported, During functional activity Standing balance-Leahy Scale: Poor Standing balance comment: unable                            Cognition Arousal/Alertness: Awake/alert Behavior During Therapy: WFL for tasks assessed/performed Overall Cognitive Status: Within Functional Limits for tasks assessed                                 General Comments: Cracking jokes today        Exercises Total Joint Exercises Ankle Circles/Pumps: AROM, Both, 10 reps, Supine    General Comments General comments (skin integrity, edema, etc.): Wife [name] present      Pertinent Vitals/Pain Pain Assessment Pain Assessment: 0-10 Pain Score: 9  Pain Location: bilateral thighs in standing Pain Descriptors / Indicators: Burning Pain Intervention(s): Limited activity within patient's tolerance, Monitored during session, Repositioned    Home Living                          Prior Function            PT Goals (current goals can now be found in the care plan section) Acute Rehab PT Goals Patient Stated Goal: to go outside, get out of room. PT Goal Formulation: With patient/family Time For Goal Achievement: 02/25/22 Potential to Achieve Goals: Good Progress towards PT goals: Progressing toward goals    Frequency    Min 3X/week      PT Plan Discharge plan needs to be updated    Co-evaluation              AM-PAC PT "6 Clicks" Mobility   Outcome Measure  Help needed turning from your back to your side while in a flat bed without using bedrails?: A Little Help needed moving from lying on your back to sitting on the side of a flat bed without using bedrails?: A Lot Help needed moving to and from a bed to a chair (including a wheelchair)?: Total Help needed standing up from a chair using your arms (e.g., wheelchair or bedside chair)?:  Total Help needed to walk in hospital room?: Total Help needed climbing 3-5 steps with a railing? : Total 6 Click Score: 9    End of Session Equipment Utilized During Treatment: Gait belt Activity Tolerance: Patient limited by fatigue;Patient limited by pain Patient left: in bed;with call bell/phone within reach;with bed alarm set;with family/visitor present;with nursing/sitter in room;with SCD's reapplied Nurse Communication: Mobility status PT Visit Diagnosis: Difficulty in walking, not elsewhere classified (R26.2);Pain Pain - Right/Left: Left Pain - part of body: Knee (R & L thighs, R knee, abdomen)     Time: 1130-1150 PT Time Calculation (min) (ACUTE ONLY): 20 min  Charges:  $Therapeutic Activity: 8-22 mins                     Coolidge Breeze, PT, DPT [facility] Rehabilitation Department Office: [phone] Weekend pager: [phone]   Coolidge Breeze 02/20/2022, 1:01 PM

## 2022-02-20 NOTE — Progress Notes (Addendum)
PHARMACY - TOTAL PARENTERAL NUTRITION CONSULT NOTE   Indication: Prolonged ileus  Patient Measurements: Height: '5\' 9"'$  (175.3 cm) Weight: 118.3 kg (260 lb 12.9 oz) IBW/kg (Calculated) : 70.7 TPN AdjBW (KG): 80.3 Body mass index is 38.51 kg/m.  Assessment: 66 yo M s/p colonic perforation status post exploratory lap, partial colectomy and transverse colostomy 02/08/2022.  PMHx Scrotal abscess August 2023, Colon polyp, Diverticulosis, ADHD, Anxiety, Rhinitis, COVID July 2022, HLD, HTN, OSA, PNA, Seborrheic dermatitis, Hypogonadism, DM type 2, Peripheral neuropathy, former smoker.   Pharmacy consulted to manage TPN for prolonged ileus.    Glucose / Insulin: hx DM2 on metformin, Jardiance PTA - Goal CBG < 180. CBGs 128-168 - rSSI used/ 24 hrs: 24 units Electrolytes: Na improved at 136 (max TPN conc), K 3.9, Phos low at 2.4 but improved (max TPN conc. Outside of TPN:  NaPhos 30 mmol on 12/3, 12/4, 12/5), Mag 1.8, CorrCa 9.4 ish - TPN held 12/2 at 2030 - 12/3 at 1800.  D10 @ 50 ml/hr. - Goal K > 4, Mag > 2 for ileus Renal: SCr <1, BUN 8.  Adequate UOP.  Hepatic: LFTs, Tbili WNL, Trig 116, Alb <1.5 Intake / Output; I/O +2.28 L/ 24 hrs - NG removed 12/5. Stool 110 mL/24 hrs. UOP 2650 mL/24 hrs.  - MIVF: 0.45% NaCl @ 10, stopped 12/4 GI Imaging:  11/28 CT A/P: post op ileus GI Surgeries / Procedures:  02/08/22:  colonic perforation status post exploratory lap, partial colectomy and transverse colostomy   Central access: PICC placed 11/28 for TPN TPN start date: 02/13/22  Nutritional Goals: Goal TPN rate is 100 mL/hr to provide 142 g of protein and 2390 kcals per day  RD Assessment: Estimated Needs Total Energy Estimated Needs: 2300-2600 kcals Total Protein Estimated Needs: 135-155 grams Total Fluid Estimated Needs: >/= 2.3L  Current Nutrition:  FL diet to start 12/6 per orders  Plan:  Now:   KPhos 15 mMol IV x 1 Magnesium 2g bolus x1   At 1800: Continue TPN at goal rate 75  ml/hr Electrolytes in TPN: Na 154 mEq/L, K 50 mEq/L, Ca 59mq/L, Mg 6 mEq/L, Phos 25 mmol/L. Cl:Ac 1:1 Add standard MVI and trace elements to TPN Add thiamine to TPN  Continue Resistant q4h SSI and adjust as needed  mIVF per MD  Monitor TPN labs on Mon/Thurs and PRN.    Thank you for allowing pharmacy to be a part of this patient's care.  JAdrian Saran PharmD, BCPS Secure Chat if ?s 02/20/2022 9:20 AM

## 2022-02-20 NOTE — TOC Progression Note (Signed)
Transition of Care [facility]) - Progression Note    Patient Details  Name: Richard [name] MRN: [id number] Date of Birth: 10/26/1955  Transition of [facility]) CM/SW Contact  Praveen Coia, Juliann Pulse, RN Phone Number: 02/20/2022, 10:40 AM  Clinical Narrative:patient has declined working w/OT. PT recc HHPT;has ostomy.  Will need documentation to support Inpt rehab per request. Left vm w/spouse [name] to discuss current reasonable options per documentation.       Expected Discharge Plan:  (TBD) Barriers to Discharge: Continued Medical Work up  Expected Discharge Plan and Services Expected Discharge Plan:  (TBD)   Discharge Planning Services: CM Consult   Living arrangements for the past 2 months: [place]: PT [place]: [place] Date [place]: 02/13/22 Time [place]: [phone] Representative spoke with at [place]: [place] ([place]) Interventions    Readmission Risk Interventions     No data to display

## 2022-02-20 NOTE — Progress Notes (Addendum)
Daily Progress Note  Hospital Day: 14  Chief Complaint: new IBD, perforated bowel  Assessment   Patient profile:  Richard [name] is a 66 y.o. male with a pmh of DM, HTN, HLD, OSA, hypogonadism, ADD, IBD ( recent dx) . Admitted with bowel perforation.   #66 yo male with newly diagnosed IBD ( non-specific IBD with active chronic and no granulomas on biopsies. Admitted with bowel bowel perforation s/p exploratory lap, sigmoid resection and transverse colostomy on 11/23.  CTAP wo contrast on 12/1 ( done for fever and increasing abdominal pain) was negative for any intra-abdominal abscess, did show post-op ileus.     # Post-op ileus. CT scan 12/1>>Multiple mildly dilated loops of small bowel in the right mid abdomen slightly greater than that seen on the prior exam.  12/5 Didn't tolerate NGT clamping. CT scan repeated and showed persistent moderately dilated small bowel most consistent with ileus, wall and fold thickening of distal duodenum and proximal jejunum suggesting possible edema or enteritis and mild amount of free throughout abdomen which most likely is postoperative in etiology. 12/6 Much better. Started reglan started yesterday.  NGT removed last night. Just completed full liquid breakfast and so far no N/V  # Malnutrition, getting TNA    # Macrocytic anemia.  No active bleeding. Hgb down about 1 gram over last several days.  Elevated B12 level  # Persistent tachycardia ( 103-110)   Plan:    Plan is to start Remicade in 4-6 weeks. Might decide at some point to go ahead and start in house. Of note, Quantiferon Gold was indeterminate but AFB negative x3.  Continue IV Reglan 10 mg Q6 hours. QT 298 ( improved, it was 480 on prior EKG). Renal function normal.   ---------------------------------------------------------------------------------------------------------------------  GI attending: Agree with Ms. Vanita Ingles note. He is significantly better.  The NG tube is out  and he is doing PT.  Has tolerated diet.  CT was without change.  At this point as long as he continues on this track would await discharge and outpatient follow-up to start Remicade therapy.  We will follow-up tomorrow.  Gatha Mayer, MD, [facility] See AMION on call - gastroenterology for best contact person 02/20/2022 12:08 PM   Subjective   Just completed full liquids breakfast. So far, no nausea since NGT removed.   Objective   Imaging:  CT ABDOMEN PELVIS W CONTRAST  Result Date: 02/19/2022 CLINICAL DATA:  Postoperative abdominal pain. EXAM: CT ABDOMEN AND PELVIS WITH CONTRAST TECHNIQUE: Multidetector CT imaging of the abdomen and pelvis was performed using the standard protocol following bolus administration of intravenous contrast. RADIATION DOSE REDUCTION: This exam was performed according to the departmental dose-optimization program which includes automated exposure control, adjustment of the mA and/or kV according to patient size and/or use of iterative reconstruction technique. CONTRAST:  160m OMNIPAQUE IOHEXOL 300 MG/ML  SOLN COMPARISON:  February 15, 2022. FINDINGS: Lower chest: Small bilateral pleural effusions are noted with probable bibasilar subsegmental atelectasis, right greater than left. Hepatobiliary: No cholelithiasis or biliary dilatation is noted. No acute abnormality seen in the liver. Pancreas: Unremarkable. No pancreatic ductal dilatation or surrounding inflammatory changes. Spleen: Normal in size without focal abnormality. Adrenals/Urinary Tract: Adrenal glands are unremarkable. Kidneys are normal, without renal calculi, focal lesion, or hydronephrosis. Bladder is unremarkable. Stomach/Bowel: Nasogastric tube tip is seen in proximal stomach. Status post colectomy with colostomy seen in left lower quadrant. Continued presence moderately dilated small bowel loops most consistent with ileus  or less likely obstruction. Wall thickening is seen  involving the distal duodenum and proximal jejunum suggesting enteritis. Vascular/Lymphatic: Aortic atherosclerosis. No enlarged abdominal or pelvic lymph nodes. Reproductive: Prostate is unremarkable. Other: Mild amount of free fluid is noted throughout the abdomen which most likely is postoperative in etiology. No definite hernia is noted. Musculoskeletal: No acute or significant osseous findings. IMPRESSION: Status post colectomy with colostomy seen in left lower quadrant. Continued presence of moderately dilated small bowel most consistent with ileus or less likely distal small bowel obstruction. Wall and fold thickening of distal duodenum and proximal jejunum is noted suggesting possible edema or enteritis. Mild amount of free fluid is noted throughout the abdomen which most likely is postoperative in etiology. Small bilateral pleural effusions are noted with associated bibasilar subsegmental atelectasis, right greater than left. Electronically Signed   By: Marijo Conception M.D.   On: 02/19/2022 15:45   DG Abd Portable 1V  Result Date: 02/19/2022 CLINICAL DATA:  Ileus. EXAM: PORTABLE ABDOMEN - 1 VIEW COMPARISON:  02/17/2022 FINDINGS: Nasogastric tube unchanged as it is coiled once over the stomach with tip over the gastric fundus in the left upper quadrant. Air is present throughout the colon. There are multiple air-filled dilated small bowel loops measuring up to 4.4 cm over the right lower quadrant. No evidence of free peritoneal air. Degenerative changes of the spine and hips. IMPRESSION: Multiple air-filled dilated small bowel loops measuring up to 4.4 cm over the right lower quadrant. Findings may be due to ileus versus early/partial small bowel obstruction. Electronically Signed   By: Marin Olp M.D.   On: 02/19/2022 08:13   DG Abd Portable 1V  Result Date: 02/17/2022 CLINICAL DATA:  NG tube placement. EXAM: PORTABLE ABDOMEN - 1 VIEW COMPARISON:  02/10/2022 FINDINGS: An enteric tube is noted  with coiled in the proximal-mid stomach. No other significant changes noted. IMPRESSION: Enteric tube with coiled in the proximal-mid stomach. Electronically Signed   By: Margarette Canada M.D.   On: 02/17/2022 17:21   DG Abd 2 Views  Result Date: 02/17/2022 CLINICAL DATA:  Small bowel obstruction. EXAM: ABDOMEN - 2 VIEW COMPARISON:  CT scan February 15, 2022 FINDINGS: The NG tube has been pulled back. The distal tip is just below the GE junction and the side port is approximately 6 or 7 cm above the GE junction. Dilated loops of small bowel remain in the lower abdomen. No other abnormalities. IMPRESSION: 1. The NG tube has been pulled back with the side port 6 or 7 cm above the GE junction. Recommend advancement. 2. Dilated loops of small bowel remain consistent with ileus versus small-bowel obstruction. These results will be called to the ordering clinician or representative by the Radiologist Assistant, and communication documented in the PACS or [employer]. Electronically Signed   By: Dorise Bullion III M.D.   On: 02/17/2022 11:46    Lab Results: Recent Labs    02/18/22 0708 02/19/22 0453 02/20/22 0455  WBC 5.7 5.2 5.0  HGB 7.8* 7.6* 7.5*  HCT 24.3* 23.7* 23.9*  PLT 241 255 257   BMET Recent Labs    02/19/22 0453 02/19/22 1431 02/20/22 0455  NA 133* 137 136  K 3.6 3.5 3.9  CL 101 103 103  CO2 '28 29 28  '$ GLUCOSE 154* 145* 169*  BUN 6* 6* 8  CREATININE 0.33* 0.30* <0.30*  CALCIUM 7.1* 7.0* 7.4*   LFT Recent Labs    02/19/22 0453  PROT 5.6*  ALBUMIN <1.5*  AST 22  ALT 12  ALKPHOS 63  BILITOT 0.4   PT/INR No results for input(s): "LABPROT", "INR" in the last 72 hours.   Scheduled inpatient medications:   Chlorhexidine Gluconate Cloth  6 each Topical Daily   enoxaparin (LOVENOX) injection  0.5 mg/kg Subcutaneous Daily   insulin aspart  0-20 Units Subcutaneous Q4H   metoCLOPramide (REGLAN) injection  10 mg Intravenous Q6H   pantoprazole (PROTONIX) IV  40 mg  Intravenous Q24H   sodium chloride flush  10-40 mL Intracatheter Q12H   Continuous inpatient infusions:   sodium chloride 80 mL/hr at 02/20/22 0615   chlorproMAZINE (THORAZINE) 12.5 mg in sodium chloride 0.9 % 25 mL IVPB Stopped (02/14/22 0913)   magnesium sulfate bolus IVPB     potassium PHOSPHATE IVPB (in mmol)     TPN ADULT (ION) 75 mL/hr at 02/20/22 0615   TPN ADULT (ION)     PRN inpatient medications: acetaminophen, chlorproMAZINE (THORAZINE) 12.5 mg in sodium chloride 0.9 % 25 mL IVPB, HYDROmorphone (DILAUDID) injection, ondansetron (ZOFRAN) IV, mouth rinse, sodium chloride flush  Vital signs in last 24 hours: Temp:  [98.3 F (36.8 C)-99.5 F (37.5 C)] 98.3 F (36.8 C) (12/06 0800) Pulse Rate:  [88-115] 103 (12/06 0700) Resp:  [15-26] 21 (12/06 0700) BP: (105-147)/(54-74) 120/62 (12/06 0700) SpO2:  [87 %-99 %] 93 % (12/06 0700) Weight:  [116.1 kg-118.3 kg] 118.3 kg (12/06 0400) Last BM Date : 02/19/22  Intake/Output Summary (Last 24 hours) at 02/20/2022 1017 Last data filed at 02/20/2022 0615 Gross per 24 hour  Intake 5540.49 ml  Output 2160 ml  Net 3380.49 ml    Intake/Output from previous day: 12/05 0701 - 12/06 0700 In: 5540.5 [P.O.:270; I.V.:5144; IV Piggyback:126.5] Out: 3260 [Urine:2650; Emesis/NG output:500; Stool:110] Intake/Output this shift: No intake/output data recorded.   Physical Exam:  General: Alert male in NAD in bedside chair Heart:  sinus tachycardia.   Pulmonary: Normal respiratory effort Abdomen: Soft, distended, hyperactive bowel sounds. About 200 ml liquid green ostomy output Extremities: BLE pitting edema   Neurologic: Alert and oriented Psych: Pleasant. Cooperative.    Principal Problem:   Perforated abdominal viscus Active Problems:   Protein-calorie malnutrition, severe   Ileus, postoperative ([place])   Crohn's disease of colon with complication ([place])     LOS: 12 days   Tye Savoy ,NP 02/20/2022, 10:17 AM

## 2022-02-20 NOTE — Progress Notes (Signed)
Occupational Therapy Treatment Patient Details Name: Richard [name] MRN: [id number] DOB: 08/07/1955 Today's Date: 02/20/2022   History of present illness Patient is a 66 year old male who was admitted on 11/23 with bowel preforation. patient underwent exploratogy lap, sigmoid resection and transverse colostomy. PMH: IBD, crohn's colitis,COVID July 2022, HLD, HTN, OSA, PNA, Seborrheic dermatitis, Hypogonadism, DM type 2, and peripheral neuopathy.   OT comments  Treatment focused on functional mobility in order to prepare for ADLs out of bed. Patient mod assist for trunk to transfer to edge of bed with increased time and use of bed rails. Patient attempted to stand x 2 from elevated bed height but could not power up. Required max assist from [name], [name] and +2 for safety to stand and pivot to recliner. Patient educated on exercising legs while in chair to prepare for standing and walking and also educated on why he needed to sit in chair to strengthen core. Patient reports he wants to get home and back to work.    Recommendations for follow up therapy are one component of a multi-disciplinary discharge planning process, led by the attending physician.  Recommendations may be updated based on patient status, additional functional criteria and insurance authorization.    Follow Up Recommendations  Acute inpatient rehab (3hours/day)     Assistance Recommended at Discharge Frequent or constant Supervision/Assistance  Patient can return home with the following  Two people to help with walking and/or transfers;A lot of help with bathing/dressing/bathroom;Assistance with cooking/housework;Direct supervision/assist for medications management;Assist for transportation;Direct supervision/assist for financial management;Help with stairs or ramp for entrance   Equipment Recommendations  Other (comment) (TBD)    Recommendations for Other Services      Precautions / Restrictions Precautions Precaution  Comments: , L colostomy, abd binder Other Brace: abdominal binder Restrictions Weight Bearing Restrictions: No       Mobility Bed Mobility Overal bed mobility: Needs Assistance Bed Mobility: Supine to Sit Rolling: Supervision Sidelying to sit: HOB elevated, Min assist       General bed mobility comments: MIn assist for trunk to transfer to edge of bed with significant increased time to perform with use of bed rails.    Transfers Overall transfer level: Needs assistance Equipment used: Rolling walker (2 wheels) Transfers: Sit to/from Stand, Bed to chair/wheelchair/BSC Sit to Stand: Max assist, From elevated surface, +2 safety/equipment     Step pivot transfers: Max assist, +2 safety/equipment     General transfer comment: Attempted to stand x 2  and patient could not power up due to weak legs. Required max assist from RN standing and pivot to recliner with + 2 for safety.     Balance Overall balance assessment: Needs assistance Sitting-balance support: No upper extremity supported, Feet supported Sitting balance-Leahy Scale: Fair     Standing balance support: Bilateral upper extremity supported, During functional activity Standing balance-Leahy Scale: Poor                             ADL either performed or assessed with clinical judgement   ADL                                              Extremity/Trunk Assessment Upper Extremity Assessment Upper Extremity Assessment: Generalized weakness   Lower Extremity Assessment Lower Extremity Assessment: Defer to PT evaluation  Cervical / Trunk Assessment Cervical / Trunk Exceptions: abdominal surgery and new colostomy    Vision Patient Visual Report: No change from baseline     Perception     Praxis      Cognition Arousal/Alertness: Awake/alert Behavior During Therapy: WFL for tasks assessed/performed Overall Cognitive Status: Within Functional Limits for tasks assessed                                           Exercises      Shoulder Instructions       General Comments      Pertinent Vitals/ Pain       Pain Assessment Pain Assessment: No/denies pain  Home Living                                          Prior Functioning/Environment              Frequency  Min 2X/week        Progress Toward Goals  OT Goals(current goals can now be found in the care plan section)  Progress towards OT goals: Progressing toward goals  Acute Rehab OT Goals Patient Stated Goal: to return to work OT Goal Formulation: With patient/family Time For Goal Achievement: 02/26/22 Potential to Achieve Goals: Good  Plan Discharge plan needs to be updated    Co-evaluation                 AM-PAC OT "6 Clicks" Daily Activity     Outcome Measure   Help from another person eating meals?: A Little Help from another person taking care of personal grooming?: A Little Help from another person toileting, which includes using toliet, bedpan, or urinal?: Total Help from another person bathing (including washing, rinsing, drying)?: A Lot Help from another person to put on and taking off regular upper body clothing?: A Little Help from another person to put on and taking off regular lower body clothing?: Total 6 Click Score: 13    End of Session Equipment Utilized During Treatment: Rolling walker (2 wheels)  OT Visit Diagnosis: Unsteadiness on feet (R26.81);Other abnormalities of gait and mobility (R26.89);Pain;Muscle weakness (generalized) (M62.81)   Activity Tolerance Patient limited by fatigue   Patient Left in chair;with call bell/phone within reach;with family/visitor present   Nurse Communication Mobility status        Time: 0829-0900 OT Time Calculation (min): 31 min  Charges: OT General Charges $OT Visit: 1 Visit OT Treatments $Therapeutic Activity: 23-37 mins  Richard [name], OTR/L [place]  Office [phone]   Richard [name] 02/20/2022, 11:02 AM

## 2022-02-20 NOTE — Progress Notes (Signed)
PROGRESS NOTE  Richard [name]  DOB: 12/04/1955  PCP: [name], MD EHM:[id number]  DOA: 02/07/2022  LOS: 12 days  Hospital Day: 14  Brief narrative: Richard [name] is a 66 y.o. male with PMH significant for DM2, HTN, HLD, obesity, OSA, hypogonadism, peripheral neuropathy.  11/7, seen in the office by Dr. [name] with complaint of diarrhea for about 3 to 4 months, 6-8 episodes a day, mixed with blood associated with abdominal cramping and progressive weakness.  Stool studies at that time were negative for infectious etiology.  Sed rate was elevated 72, fecal calprotectin was elevated to 3570. 11/20, underwent colonoscopy, found to have diffuse colitis with deep ulcerations throughout the entire colon friable mucosa as well as extensive mucosal edema in the rectum.  He was started on prednisone 40 mg daily. After colonoscopy, pat ient developed worsening abdominal pain. 11/23, at home Richard [name] had 1 episode of syncope.  Also had a fever.  Abdominal pain worsened and hence came to the ED. In the ED he had fever of 102.1 with tachycardia. CT abdomen pelvis showed perforated bowel with moderate free fluid and free air.  Richard [name] was started on IV antibiotics, IV fluid Richard [name] was admitted to ICU. General surgery was consulted 11/24, Richard [name] underwent exploratory laparotomy, descending and sigmoid colon resection, transverse colostomy by Dr. [name] 11/28, CT abdomen showed mild dilated small bowel loops with scattered air-fluid levels likely postop ileus.  It also showed scattered free fluid in the abdomen pelvis and small amount of free air and free fluid in the hepatorenal fossa without discrete drainable abscess.  Postop ileus has not resolved yet..   See below for details.  Subjective: Richard [name] was seen and examined this morning.  Seen out of the chair today.  NG tube removed yesterday.  Had good output from the ostomy bag.  Feels better without it.  He was not on supplemental  oxygen at the time of my evaluation but was tachypneic and tachycardic to maintain saturation close to 90%. Wife is at bedside.  Assessment and plan: Acute peritonitis d/t perforated sigmoid colon Inflammatory bowel disease S/p exploratory laparotomy, descending and sigmoid colon resection, transverse colostomy - 11/24 Sepsis - POA Being followed by GI, general surgery. Blood culture 11/27 with no growth. Pending report of cultures sent on 12/1. No fever spike after antibiotics was broadened to IV meropenem and IV vancomycin on 12/1 surgical pathology report showed transmural inflammation consistent with IBD.  Noted a plan from GI to start on Remicade. Pain management per general surgery.  Currently on as needed IV Dilaudid. Recent Labs  Lab 02/16/22 0515 02/17/22 0432 02/18/22 0708 02/19/22 0453 02/20/22 0455  WBC 6.0 4.6 5.7 5.2 5.0   Postop ileus Improved.  NG tube removed yesterday.  Good output from the ostomy.   11/28, PICC line placed and TPN was started on 11/29.  TPN stopped this morning.   Postop respiratory insufficiency 11/28, CT scan showed small bilateral pleural effusion and moderate overlying atelectasis. Incentive spirometry, flutter valve, ambulation as tolerated Currently on 3 L oxygen via nasal cannula. Continue to wean down as tolerated At the time of my evaluation, Richard [name] was on room air but was tachycardic and tachypneic to maintain saturation more than 90%.  Suggest gradual weaning.  Hypokalemia/hyperkalemia  Hypomagnesemia/hypophosphatemia Acute metabolic alkalosis  36/62, blood gas showed significant alkalosis with pH 7.6, pCO2 59, serum bicarb 58 This is likely due to gastric secretions causing loss of acid as well as hypovolemia.   Nephrology  consult appreciated.  Started on PPI. Aggressively hydrated. Electrolyte management challenging because of poor oral intake, high volume NG tube output and the use of TPN.   Phosphorus low this morning.   Replacement by pharmacy. Recent Labs  Lab 02/16/22 0515 02/16/22 1848 02/17/22 0432 02/17/22 0852 02/17/22 1732 02/18/22 0708 02/18/22 0709 02/19/22 0453 02/19/22 1431 02/20/22 0455  K  --    < > 4.3   < > 3.8  --  3.8 3.6 3.5 3.9  MG 1.7  --  2.0  --   --  1.6*  --  1.9  --  1.8  PHOS 1.6*  --  1.7*  --   --  1.8*  --  2.1*  --  2.4*   < > = values in this interval not displayed.   Type 2 diabetes mellitus A1c 6.6 on 01/23/2022 PTA on metformin, Jardiance Currently on sliding scale insulin with Accu-Cheks every 4 hours.  Continue to monitor.   Recent Labs  Lab 02/19/22 2338 02/20/22 0450 02/20/22 0722 02/20/22 1201 02/20/22 1516  GLUCAP 145* 159* 154* 142* 194*   Moderate Protein Calorie Malnutrition Albumin significantly low.  Dietitian consulted.  Bilateral lower EXTR edema Related to third spacing.  Albumin significantly low. Once clinical status improves, Richard [name] will benefit from Lasix  Acute metabolic encephalopathy Richard [name] is slow to respond, clouded and disoriented. Expect mental status to improve with clinical improvement.  Impaired mobility Significant weakness.  Multifactorial: Low-level electrolytes, no oral intake, dehydration, physical deconditioning PT eval ordered.  H/o Essential HTN lisinopril remains on hold   HLD Lipitor remains on hold  Obesity -Body mass index is 38.51 kg/m. Richard [name] has been advised to make an attempt to improve diet and exercise patterns to aid in weight loss.  OSA Baseline CPAP 10 cm H20, followed by Dr. Ander Slade CPAP QSH   NGT may impede mask seal    Hx of ADHD, Anxiety methylphenidate on hold   Hx of Hypogonadism. Was on outpatient testosterone       Goals of care   Code Status: Full Code    Mobility: PT follow-up needed.  Infusions:   sodium chloride 80 mL/hr at 02/20/22 0615   chlorproMAZINE (THORAZINE) 12.5 mg in sodium chloride 0.9 % 25 mL IVPB Stopped (02/14/22 0913)   potassium PHOSPHATE IVPB  (in mmol) 15 mmol (02/20/22 1311)   TPN ADULT (ION) 75 mL/hr at 02/20/22 0615   TPN ADULT (ION)      Scheduled Meds:  Chlorhexidine Gluconate Cloth  6 each Topical Daily   enoxaparin (LOVENOX) injection  0.5 mg/kg Subcutaneous Daily   insulin aspart  0-20 Units Subcutaneous Q4H   metoCLOPramide (REGLAN) injection  10 mg Intravenous Q6H   pantoprazole (PROTONIX) IV  40 mg Intravenous Q24H   sodium chloride flush  10-40 mL Intracatheter Q12H    PRN meds: acetaminophen, chlorproMAZINE (THORAZINE) 12.5 mg in sodium chloride 0.9 % 25 mL IVPB, HYDROmorphone (DILAUDID) injection, ondansetron (ZOFRAN) IV, mouth rinse, sodium chloride flush   Skin assessment:     Nutritional status:  Body mass index is 38.51 kg/m.  Nutrition Problem: Severe Malnutrition Etiology: acute illness (new dx Crohn's colitis; bowel perforation) Signs/Symptoms: moderate fat depletion, moderate muscle depletion, percent weight loss Percent weight loss: 10 % (in 4 months)     Diet:  Diet Order             Diet full liquid Room service appropriate? Yes; Fluid consistency: Thin  Diet effective now  DVT prophylaxis:  Place and maintain sequential compression device Start: 02/08/22 0001   Antimicrobials: IV meropenem and IV vancomycin to continue Fluid: TPN per general surgery.   Consultants: General surgery, nephrology Family Communication: Wife at bedside  Status is: Inpatient  Continue in-hospital care because: Gradually improving postop ileus. Level of care: ICU   Dispo: The Richard [name] is from: Home              Anticipated d/c is to: Pending clinical course              Richard [name] currently is not medically stable to d/c.   Difficult to place Richard [name] No       Antimicrobials: Anti-infectives (From admission, onward)    Start     Dose/Rate Route Frequency Ordered Stop   02/15/22 1400  meropenem (MERREM) 1 g in sodium chloride 0.9 % 100 mL IVPB  Status:  Discontinued         1 g 200 mL/hr over 30 Minutes Intravenous Every 8 hours 02/15/22 1244 02/19/22 1709   02/15/22 1400  vancomycin (VANCOCIN) IVPB 1000 mg/200 mL premix  Status:  Discontinued        1,000 mg 200 mL/hr over 60 Minutes Intravenous Every 12 hours 02/15/22 1304 02/18/22 1445   02/08/22 1200  piperacillin-tazobactam (ZOSYN) IVPB 3.375 g  Status:  Discontinued        3.375 g 12.5 mL/hr over 240 Minutes Intravenous Every 8 hours 02/08/22 0505 02/15/22 1243   02/08/22 0500  piperacillin-tazobactam (ZOSYN) IVPB 3.375 g  Status:  Discontinued        3.375 g 12.5 mL/hr over 240 Minutes Intravenous Every 8 hours 02/08/22 0006 02/08/22 0505   02/08/22 0334  piperacillin-tazobactam (ZOSYN) 3.375 GM/50ML IVPB       Note to Pharmacy: Enis Gash W: cabinet override      02/08/22 0334 02/08/22 0345   02/07/22 2030  ceFEPIme (MAXIPIME) 2 g in sodium chloride 0.9 % 100 mL IVPB        2 g 200 mL/hr over 30 Minutes Intravenous  Once 02/07/22 2023 02/07/22 2248   02/07/22 2030  metroNIDAZOLE (FLAGYL) IVPB 500 mg        500 mg 100 mL/hr over 60 Minutes Intravenous  Once 02/07/22 2023 02/08/22 0506   02/07/22 2030  vancomycin (VANCOCIN) IVPB 1000 mg/200 mL premix        1,000 mg 200 mL/hr over 60 Minutes Intravenous  Once 02/07/22 2023 02/08/22 0506       Objective: Vitals:   02/20/22 1500 02/20/22 1600  BP:  139/62  Pulse: (!) 118 (!) 109  Resp: 19 (!) 21  Temp:  (!) 101.3 F (38.5 C)  SpO2: 91% (!) 89%    Intake/Output Summary (Last 24 hours) at 02/20/2022 1624 Last data filed at 02/20/2022 1311 Gross per 24 hour  Intake 5540.49 ml  Output 2910 ml  Net 2630.49 ml   Filed Weights   02/19/22 0400 02/19/22 1800 02/20/22 0400  Weight: 116.1 kg 116.1 kg 118.3 kg   Weight change: 0 kg Body mass index is 38.51 kg/m.   Physical Exam: General exam: Pleasant elderly Caucasian male.  Sitting up in recliner.  NG tube out.  Not on supplemental oxygen Skin: No rashes, lesions or ulcers. HEENT:  Atraumatic, normocephalic, no obvious bleeding.  NG tube remains clamped. Lungs: Diminished air entry in both bases.  No crackles or wheezing. CVS: Regular rate and rhythm, no murmur GI/Abd soft, mild postop appropriate tenderness, bowel sounds  minimal.  Ostomy bag with liquid output. CNS: Eyes open, slow to answer questions.  Oriented to place. Psychiatry: Sad affect Extremities: 2+ bilateral pedal edema, no calf tenderness  Data Review: I have personally reviewed the laboratory data and studies available.  F/u labs ordered Unresulted Labs (From admission, onward)     Start     Ordered   02/21/22 0500  Comprehensive metabolic panel  (TPN Lab Panel)  Every Mon,Thu (0500),   R     Question:  Specimen collection method  Answer:  Unit=Unit collect   02/18/22 1037   02/21/22 0500  Magnesium  (TPN Lab Panel)  Every Mon,Thu (0500),   R     Question:  Specimen collection method  Answer:  Unit=Unit collect   02/18/22 1037   02/21/22 0500  Phosphorus  (TPN Lab Panel)  Every Mon,Thu (0500),   R     Question:  Specimen collection method  Answer:  Unit=Unit collect   02/18/22 1037   02/15/22 1234  Expectorated Sputum Assessment w Gram Stain, Rflx to Resp Cult  Once,   R        02/15/22 1233   02/14/22 0500  Triglycerides  (TPN Lab Panel)  Every Mon,Thu (0500),   R     Question:  Specimen collection method  Answer:  Lab=Lab collect   02/12/22 1801   02/13/22 0500  CBC with Differential/Platelet  Daily,   R     Question:  Specimen collection method  Answer:  Lab=Lab collect   02/12/22 0927   02/13/22 0500  Magnesium  Daily,   R     Question:  Specimen collection method  Answer:  Lab=Lab collect   02/12/22 0927   02/13/22 0500  Phosphorus  Daily,   R     Question:  Specimen collection method  Answer:  Lab=Lab collect   02/12/22 0927   02/11/22 1800  Acid Fast Culture with reflexed sensitivities  (AFB smear + Culture w reflexed sensitivities panel)  3 times daily,   R,   Status:  Canceled      See Hyperspace for full Linked Orders Report.   02/11/22 1544            Signed, Terrilee Croak, MD [facility] 02/20/2022

## 2022-02-20 NOTE — Progress Notes (Signed)
Patient has home CPAP at bedside but wishes not to use it tonight. RN aware. RT will continue to follow and encourage use.

## 2022-02-20 NOTE — Progress Notes (Signed)
Progress Note  13 Days Post-Op  Subjective: Feels good after NG removed.  Wants to try liquids this morning  Objective: Vital signs in last 24 hours: Temp:  [98.3 F (36.8 C)-99.5 F (37.5 C)] 98.3 F (36.8 C) (12/06 0800) Pulse Rate:  [88-115] 103 (12/06 0700) Resp:  [13-26] 21 (12/06 0700) BP: (105-147)/(54-74) 120/62 (12/06 0700) SpO2:  [87 %-99 %] 93 % (12/06 0700) Weight:  [116.1 kg-118.3 kg] 118.3 kg (12/06 0400) Last BM Date : 02/19/22  Intake/Output from previous day: 12/05 0701 - 12/06 0700 In: 5540.5 [P.O.:270; I.V.:5144; IV Piggyback:126.5] Out: 3260 [Urine:2650; Emesis/NG output:500; Stool:110] Intake/Output this shift: No intake/output data recorded.  PE: General: pleasant, WD, obese malewho is laying in bed in NAD Heart: regular, rate, and rhythm.   Lungs:Respiratory effort nonlabored Abd: soft, appropriately ttp, moderately distended, midline wound with healthy with beefy red granulation tissue, ostomy with some ulcerations but mostly viable with small amount liquid stool in bag   Lab Results:  Recent Labs    02/19/22 0453 02/20/22 0455  WBC 5.2 5.0  HGB 7.6* 7.5*  HCT 23.7* 23.9*  PLT 255 257    BMET Recent Labs    02/19/22 1431 02/20/22 0455  NA 137 136  K 3.5 3.9  CL 103 103  CO2 29 28  GLUCOSE 145* 169*  BUN 6* 8  CREATININE 0.30* <0.30*  CALCIUM 7.0* 7.4*    PT/INR No results for input(s): "LABPROT", "INR" in the last 72 hours. CMP     Component Value Date/Time   NA 136 02/20/2022 0455   K 3.9 02/20/2022 0455   CL 103 02/20/2022 0455   CO2 28 02/20/2022 0455   GLUCOSE 169 (H) 02/20/2022 0455   BUN 8 02/20/2022 0455   CREATININE <0.30 (L) 02/20/2022 0455   CREATININE 0.63 (L) 03/10/2019 1436   CALCIUM 7.4 (L) 02/20/2022 0455   PROT 5.6 (L) 02/19/2022 0453   ALBUMIN <1.5 (L) 02/19/2022 0453   AST 22 02/19/2022 0453   ALT 12 02/19/2022 0453   ALKPHOS 63 02/19/2022 0453   BILITOT 0.4 02/19/2022 0453   GFRNONAA NOT  CALCULATED 02/20/2022 0455   Lipase  No results found for: "LIPASE"     Studies/Results: CT ABDOMEN PELVIS W CONTRAST  Result Date: 02/19/2022 CLINICAL DATA:  Postoperative abdominal pain. EXAM: CT ABDOMEN AND PELVIS WITH CONTRAST TECHNIQUE: Multidetector CT imaging of the abdomen and pelvis was performed using the standard protocol following bolus administration of intravenous contrast. RADIATION DOSE REDUCTION: This exam was performed according to the departmental dose-optimization program which includes automated exposure control, adjustment of the mA and/or kV according to patient size and/or use of iterative reconstruction technique. CONTRAST:  138m OMNIPAQUE IOHEXOL 300 MG/ML  SOLN COMPARISON:  February 15, 2022. FINDINGS: Lower chest: Small bilateral pleural effusions are noted with probable bibasilar subsegmental atelectasis, right greater than left. Hepatobiliary: No cholelithiasis or biliary dilatation is noted. No acute abnormality seen in the liver. Pancreas: Unremarkable. No pancreatic ductal dilatation or surrounding inflammatory changes. Spleen: Normal in size without focal abnormality. Adrenals/Urinary Tract: Adrenal glands are unremarkable. Kidneys are normal, without renal calculi, focal lesion, or hydronephrosis. Bladder is unremarkable. Stomach/Bowel: Nasogastric tube tip is seen in proximal stomach. Status post colectomy with colostomy seen in left lower quadrant. Continued presence moderately dilated small bowel loops most consistent with ileus or less likely obstruction. Wall thickening is seen involving the distal duodenum and proximal jejunum suggesting enteritis. Vascular/Lymphatic: Aortic atherosclerosis. No enlarged abdominal or pelvic lymph nodes. Reproductive:  Prostate is unremarkable. Other: Mild amount of free fluid is noted throughout the abdomen which most likely is postoperative in etiology. No definite hernia is noted. Musculoskeletal: No acute or significant osseous  findings. IMPRESSION: Status post colectomy with colostomy seen in left lower quadrant. Continued presence of moderately dilated small bowel most consistent with ileus or less likely distal small bowel obstruction. Wall and fold thickening of distal duodenum and proximal jejunum is noted suggesting possible edema or enteritis. Mild amount of free fluid is noted throughout the abdomen which most likely is postoperative in etiology. Small bilateral pleural effusions are noted with associated bibasilar subsegmental atelectasis, right greater than left. Electronically Signed   By: Marijo Conception M.D.   On: 02/19/2022 15:45   DG Abd Portable 1V  Result Date: 02/19/2022 CLINICAL DATA:  Ileus. EXAM: PORTABLE ABDOMEN - 1 VIEW COMPARISON:  02/17/2022 FINDINGS: Nasogastric tube unchanged as it is coiled once over the stomach with tip over the gastric fundus in the left upper quadrant. Air is present throughout the colon. There are multiple air-filled dilated small bowel loops measuring up to 4.4 cm over the right lower quadrant. No evidence of free peritoneal air. Degenerative changes of the spine and hips. IMPRESSION: Multiple air-filled dilated small bowel loops measuring up to 4.4 cm over the right lower quadrant. Findings may be due to ileus versus early/partial small bowel obstruction. Electronically Signed   By: Marin Olp M.D.   On: 02/19/2022 08:13    Anti-infectives: Anti-infectives (From admission, onward)    Start     Dose/Rate Route Frequency Ordered Stop   02/15/22 1400  meropenem (MERREM) 1 g in sodium chloride 0.9 % 100 mL IVPB  Status:  Discontinued        1 g 200 mL/hr over 30 Minutes Intravenous Every 8 hours 02/15/22 1244 02/19/22 1709   02/15/22 1400  vancomycin (VANCOCIN) IVPB 1000 mg/200 mL premix  Status:  Discontinued        1,000 mg 200 mL/hr over 60 Minutes Intravenous Every 12 hours 02/15/22 1304 02/18/22 1445   02/08/22 1200  piperacillin-tazobactam (ZOSYN) IVPB 3.375 g  Status:   Discontinued        3.375 g 12.5 mL/hr over 240 Minutes Intravenous Every 8 hours 02/08/22 0505 02/15/22 1243   02/08/22 0500  piperacillin-tazobactam (ZOSYN) IVPB 3.375 g  Status:  Discontinued        3.375 g 12.5 mL/hr over 240 Minutes Intravenous Every 8 hours 02/08/22 0006 02/08/22 0505   02/08/22 0334  piperacillin-tazobactam (ZOSYN) 3.375 GM/50ML IVPB       Note to Pharmacy: Enis Gash W: cabinet override      02/08/22 0334 02/08/22 0345   02/07/22 2030  ceFEPIme (MAXIPIME) 2 g in sodium chloride 0.9 % 100 mL IVPB        2 g 200 mL/hr over 30 Minutes Intravenous  Once 02/07/22 2023 02/07/22 2248   02/07/22 2030  metroNIDAZOLE (FLAGYL) IVPB 500 mg        500 mg 100 mL/hr over 60 Minutes Intravenous  Once 02/07/22 2023 02/08/22 0506   02/07/22 2030  vancomycin (VANCOCIN) IVPB 1000 mg/200 mL premix        1,000 mg 200 mL/hr over 60 Minutes Intravenous  Once 02/07/22 2023 02/08/22 0506        Assessment/Plan  Sigmoid perforation  S/P exploratory laparotomy, colon resection, end colostomy 02/07/22 Dr. [name] - NG removed, having bowel function after CT yesterday - CT 12/1 with some free fluid  but no organized collection, CT 12/5 with no major changes - GI following as well for IBD, fairly new diagnosis - have been holding on steroids pending infectious workup - continue TPN/abx  FEN: FLD, TPN VTE: LMWH ID: vanc/flagyl/cefepime 11/23; zosyn 11/24>11/29; vanc 12/1>12/4; merrem 12/1>> 12/5  - below per TR[facility] -  Post-operative respiratory insufficiency T2DM Physical deconditioning  Acute metabolic encephalopathy, improving  HTN HLD Obesity class I OSA Hypogonadism Moderate protein calorie malnutrition  ADHD/Anxiety  LOS: 12 days    Felicie Morn, MD [facility] 02/20/2022, 8:27 AM Please see Amion for pager number during day hours 7:00am-4:30pm

## 2022-02-21 DIAGNOSIS — K567 Ileus, unspecified: Secondary | ICD-10-CM | POA: Diagnosis not present

## 2022-02-21 DIAGNOSIS — K9189 Other postprocedural complications and disorders of digestive system: Secondary | ICD-10-CM | POA: Diagnosis not present

## 2022-02-21 DIAGNOSIS — K50119 Crohn's disease of large intestine with unspecified complications: Secondary | ICD-10-CM | POA: Diagnosis not present

## 2022-02-21 DIAGNOSIS — R198 Other specified symptoms and signs involving the digestive system and abdomen: Secondary | ICD-10-CM | POA: Diagnosis not present

## 2022-02-21 LAB — TRIGLYCERIDES: Triglycerides: 144 mg/dL (ref <150)

## 2022-02-21 LAB — CBC WITH DIFFERENTIAL/PLATELET
Abs Immature Granulocytes: 0.04 10*3/uL (ref 0.00–0.07)
Basophils Absolute: 0 10*3/uL (ref 0.0–0.1)
Basophils Relative: 0 %
Eosinophils Absolute: 0.2 10*3/uL (ref 0.0–0.5)
Eosinophils Relative: 3 %
HCT: 23.6 % — ABNORMAL LOW (ref 39.0–52.0)
Hemoglobin: 7.4 g/dL — ABNORMAL LOW (ref 13.0–17.0)
Immature Granulocytes: 1 %
Lymphocytes Relative: 10 %
Lymphs Abs: 0.4 10*3/uL — ABNORMAL LOW (ref 0.7–4.0)
MCH: 32.9 pg (ref 26.0–34.0)
MCHC: 31.4 g/dL (ref 30.0–36.0)
MCV: 104.9 fL — ABNORMAL HIGH (ref 80.0–100.0)
Monocytes Absolute: 0.3 10*3/uL (ref 0.1–1.0)
Monocytes Relative: 7 %
Neutro Abs: 3.6 10*3/uL (ref 1.7–7.7)
Neutrophils Relative %: 79 %
Platelets: 268 10*3/uL (ref 150–400)
RBC: 2.25 MIL/uL — ABNORMAL LOW (ref 4.22–5.81)
RDW: 14.2 % (ref 11.5–15.5)
WBC: 4.5 10*3/uL (ref 4.0–10.5)
nRBC: 0 % (ref 0.0–0.2)

## 2022-02-21 LAB — COMPREHENSIVE METABOLIC PANEL
ALT: 10 U/L (ref 0–44)
AST: 16 U/L (ref 15–41)
Albumin: <1.5 g/dL — ABNORMAL LOW (ref 3.5–5.0)
Alkaline Phosphatase: 54 U/L (ref 38–126)
Anion gap: 5 (ref 5–15)
BUN: 9 mg/dL (ref 8–23)
CO2: 23 mmol/L (ref 22–32)
Calcium: 5.9 mg/dL — CL (ref 8.9–10.3)
Chloride: 113 mmol/L — ABNORMAL HIGH (ref 98–111)
Creatinine, Ser: <0.3 mg/dL — ABNORMAL LOW (ref 0.61–1.24)
Glucose, Bld: 138 mg/dL — ABNORMAL HIGH (ref 70–99)
Potassium: 2.8 mmol/L — ABNORMAL LOW (ref 3.5–5.1)
Sodium: 141 mmol/L (ref 135–145)
Total Bilirubin: 0.3 mg/dL (ref 0.3–1.2)
Total Protein: 4.1 g/dL — ABNORMAL LOW (ref 6.5–8.1)

## 2022-02-21 LAB — BASIC METABOLIC PANEL
Anion gap: 4 — ABNORMAL LOW (ref 5–15)
BUN: 10 mg/dL (ref 8–23)
CO2: 28 mmol/L (ref 22–32)
Calcium: 7.3 mg/dL — ABNORMAL LOW (ref 8.9–10.3)
Chloride: 103 mmol/L (ref 98–111)
Creatinine, Ser: <0.3 mg/dL — ABNORMAL LOW (ref 0.61–1.24)
Glucose, Bld: 179 mg/dL — ABNORMAL HIGH (ref 70–99)
Potassium: 4.1 mmol/L (ref 3.5–5.1)
Sodium: 135 mmol/L (ref 135–145)

## 2022-02-21 LAB — GLUCOSE, CAPILLARY
Glucose-Capillary: 143 mg/dL — ABNORMAL HIGH (ref 70–99)
Glucose-Capillary: 162 mg/dL — ABNORMAL HIGH (ref 70–99)
Glucose-Capillary: 157 mg/dL — ABNORMAL HIGH (ref 70–99)
Glucose-Capillary: 186 mg/dL — ABNORMAL HIGH (ref 70–99)
Glucose-Capillary: 155 mg/dL — ABNORMAL HIGH (ref 70–99)
Glucose-Capillary: 154 mg/dL — ABNORMAL HIGH (ref 70–99)

## 2022-02-21 LAB — MAGNESIUM
Magnesium: 1.5 mg/dL — ABNORMAL LOW (ref 1.7–2.4)
Magnesium: 2 mg/dL (ref 1.7–2.4)

## 2022-02-21 LAB — PHOSPHORUS
Phosphorus: 1.8 mg/dL — ABNORMAL LOW (ref 2.5–4.6)
Phosphorus: 2.7 mg/dL (ref 2.5–4.6)

## 2022-02-21 MED ORDER — POTASSIUM PHOSPHATES 15 MMOLE/5ML IV SOLN
30.0000 mmol | Freq: Once | INTRAVENOUS | Status: AC
  Administered 2022-02-21: 30 mmol via INTRAVENOUS
  Filled 2022-02-21: qty 10

## 2022-02-21 MED ORDER — MAGNESIUM SULFATE 4 GM/100ML IV SOLN
4.0000 g | Freq: Once | INTRAVENOUS | Status: AC
  Administered 2022-02-21: 4 g via INTRAVENOUS
  Filled 2022-02-21: qty 100

## 2022-02-21 MED ORDER — METOCLOPRAMIDE HCL 5 MG/ML IJ SOLN
5.0000 mg | Freq: Four times a day (QID) | INTRAMUSCULAR | Status: DC
  Administered 2022-02-21 – 2022-02-23 (×9): 5 mg via INTRAVENOUS
  Filled 2022-02-21 (×9): qty 2

## 2022-02-21 MED ORDER — TRAVASOL 10 % IV SOLN
INTRAVENOUS | Status: AC
  Filled 2022-02-21: qty 1200

## 2022-02-21 NOTE — Progress Notes (Signed)
Physical Therapy Treatment Patient Details Name: Richard [name] MRN: [id number] DOB: 1955-09-08 Today's Date: 02/21/2022   History of Present Illness Patient is a 66 year old male who was admitted on 11/23 with bowel preforation. patient underwent exploratogy lap, sigmoid resection and transverse colostomy. PMH: IBD, crohn's colitis,COVID July 2022, HLD, HTN, OSA, PNA, Seborrheic dermatitis, Hypogonadism, DM type 2, and peripheral neuopathy.    PT Comments    Patient reports feeling very tired. Noted HGB 7.4.  Patient assisted to sitting on bed edge with +2 max assist.  Patient stood x 2 with +3 to power up to stand,. Patient unable to take any stps to safely turn to recliner. Assisted back into bed. (Abdominal binder was not in place during  therapy.).   SPO2 4 l 96 %. Noted drop to  85 5 on RA during activity. Replaced to 4 L.   HR and BP stable. No report of dizziness.  Recommendations for follow up therapy are one component of a multi-disciplinary discharge planning process, led by the attending physician.  Recommendations may be updated based on patient status, additional functional criteria and insurance authorization.  Follow Up Recommendations  Acute inpatient rehab (3hours/day)     Assistance Recommended at Discharge Frequent or constant Supervision/Assistance  Patient can return home with the following Two people to help with walking and/or transfers;Assistance with cooking/housework;Assist for transportation;Help with stairs or ramp for entrance;Two people to help with bathing/dressing/bathroom   Equipment Recommendations  Rolling walker (2 wheels)    Recommendations for Other Services       Precautions / Restrictions Precautions Precaution Comments: L colostomy, abd binder Other Brace: binder was not in place     Mobility  Bed Mobility Overal bed mobility: Needs Assistance Bed Mobility: Rolling, Sidelying to Sit, Sit to Sidelying Rolling: Min assist Sidelying to  sit: HOB elevated, Mod assist, +2 for physical assistance, +2 for safety/equipment     Sit to sidelying: Total assist, +2 for physical assistance, +2 for safety/equipment General bed mobility comments: Pt required max assist to bring BLE to bed edge, use of bed pad to scoot hips foward. Patient demonstrates poor endurance. Max +2 to return to supine, decrease control of return to supine    Transfers Overall transfer level: Needs assistance Equipment used: Rolling walker (2 wheels) Transfers: Sit to/from Stand Sit to Stand: Max assist, From elevated surface, +2 safety/equipment (+3.)           General transfer comment: + 3 to use bed pad to assist patient to stand with 123 count. Stood x 2, unaable to safely take a step or shuffle feet. Returned to bed in chair position    Ambulation/Gait                   Stairs             Wheelchair Mobility    Modified Rankin (Stroke Patients Only)       Balance Overall balance assessment: Needs assistance Sitting-balance support: No upper extremity supported, Feet supported Sitting balance-Leahy Scale: Fair     Standing balance support: Bilateral upper extremity supported, During functional activity, Reliant on assistive device for balance Standing balance-Leahy Scale: Poor                              Cognition Arousal/Alertness: Awake/alert Behavior During Therapy: WFL for tasks assessed/performed Overall Cognitive Status: Within Functional Limits for tasks assessed  Exercises      General Comments        Pertinent Vitals/Pain Pain Assessment Faces Pain Scale: Hurts whole lot Pain Descriptors / Indicators: Cramping, Discomfort Pain Intervention(s): Monitored during session, Limited activity within patient's tolerance    Home Living Family/patient expects to be discharged to:: Private residence Living Arrangements: Spouse/significant  other                      Prior Function            PT Goals (current goals can now be found in the care plan section) Progress towards PT goals: Progressing toward goals    Frequency    Min 3X/week (incr. whebn off TPN)      PT Plan Current plan remains appropriate;Frequency needs to be updated    Co-evaluation              AM-PAC PT "6 Clicks" Mobility   Outcome Measure  Help needed turning from your back to your side while in a flat bed without using bedrails?: A Lot Help needed moving from lying on your back to sitting on the side of a flat bed without using bedrails?: Total Help needed moving to and from a bed to a chair (including a wheelchair)?: Total Help needed standing up from a chair using your arms (e.g., wheelchair or bedside chair)?: Total Help needed to walk in hospital room?: Total Help needed climbing 3-5 steps with a railing? : Total 6 Click Score: 7    End of Session Equipment Utilized During Treatment: Gait belt Activity Tolerance: Patient limited by fatigue;Patient limited by pain Patient left: in bed;with call bell/phone within reach;with family/visitor present;with nursing/sitter in room;with SCD's reapplied Nurse Communication: Mobility status PT Visit Diagnosis: Difficulty in walking, not elsewhere classified (R26.2);Pain;Muscle weakness (generalized) (M62.81)     Time: 6754-4920 PT Time Calculation (min) (ACUTE ONLY): 35 min  Charges:  $Therapeutic Activity: 23-37 mins                     [place] Office [phone] Weekend [phone]    Claretha Cooper 02/21/2022, 12:47 PM

## 2022-02-21 NOTE — Progress Notes (Signed)
PROGRESS NOTE  Richard [name]  DOB: [birth year]  PCP: [name], MD IRS:[id number]  DOA: 02/07/2022  LOS: 13 days  Hospital Day: 15  Brief narrative: Richard [name] is a 66 y.o. male with PMH significant for DM2, HTN, HLD, obesity, OSA, hypogonadism, peripheral neuropathy.  11/7, seen in the office by Dr. [name] with complaint of diarrhea for about 3 to 4 months, 6-8 episodes a day, mixed with blood associated with abdominal cramping and progressive weakness.  Stool studies at that time were negative for infectious etiology.  Sed rate was elevated 72, fecal calprotectin was elevated to 3570. 11/20, underwent colonoscopy, found to have diffuse colitis with deep ulcerations throughout the entire colon friable mucosa as well as extensive mucosal edema in the rectum.  He was started on prednisone 40 mg daily. After colonoscopy, pat ient developed worsening abdominal pain. 11/23, at home patient had 1 episode of syncope.  Also had a fever.  Abdominal pain worsened and hence came to the ED. In the ED he had fever of 102.1 with tachycardia. CT abdomen pelvis showed perforated bowel with moderate free fluid and free air.  Patient was started on IV antibiotics, IV fluid Patient was admitted to ICU. General surgery was consulted 11/24, patient underwent exploratory laparotomy, descending and sigmoid colon resection, transverse colostomy by Dr. [name]. Surgical pathology showed transmural inflammation consistent with IBD. His postop course was complicated by almost 2 weeks of postop ileus which gradually resolved.  In the 2 weeks duration, he had significant fluid and electrolyte imbalance, hypoxia, fever, encephalopathy and physical deconditioning.  He was kept on TPN.  Subjective: Patient was seen and examined this morning.  Propped up in bed.  On 4 L oxygen by nasal cannula Able to tolerate small amount of p.o. intake but not enough to maintain nutrition and hence TPN continues. Wife  at bedside.  Assessment and plan: Sepsis POA  Acute peritonitis d/t perforated sigmoid colon S/p exploratory laparotomy, descending and sigmoid colon resection, transverse colostomy - 11/24 Postop ileus Resolving postop ileus.  NG tube out on 12/5.  TPN continued because of poor oral intake.  Continue to monitor ostomy output Because of breakthrough fever, he was started on broad-spectrum IV antibiotic coverage with IV meropenem and IV vancomycin.  Completed 5-day course.  No growth on culture. No fever, WBC count normal. As needed pain meds  Inflammatory bowel disease GI following.   Surgical pathology report showed transmural inflammation consistent with IBD.   Noted a plan from GI to start on Remicade in 4 to 6 weeks Recent Labs  Lab 02/17/22 0432 02/18/22 0708 02/19/22 0453 02/20/22 0455 02/21/22 0445  WBC 4.6 5.7 5.2 5.0 4.5   Postop respiratory insufficiency Probably due to atelectasis from immobility, postop abdominal distention, third spacing of fluid Currently on supplemental oxygen at 3 to 4 L/min.  Wean down as tolerated  Hypokalemia/hyperkalemia  Hypomagnesemia/hypophosphatemia Acute metabolic alkalosis  His postop course was complicated by significant fluid endplate abnormalities including severe metabolic alkalosis of 7.6 due to high output gastric secretion.  Nephrology was consulted.  Acid-base balance has eventually improved Electrolytes being monitored. Labs from this morning with low potassium, magnesium and phosphorus.  Discussed with pharmacy.  Replacement given.  Continue to monitor. Recent Labs  Lab 02/17/22 0432 02/17/22 0852 02/18/22 0708 02/18/22 0709 02/19/22 0453 02/19/22 1431 02/20/22 0455 02/21/22 0445  K 4.3   < >  --  3.8 3.6 3.5 3.9 2.8*  MG 2.0  --  1.6*  --  1.9  --  1.8 1.5*  PHOS 1.7*  --  1.8*  --  2.1*  --  2.4* 1.8*   < > = values in this interval not displayed.   Moderate Protein Calorie Malnutrition Significant  hypoalbuminemia Albumin level significantly low <1.5 Patient has 2+ pedal edema as well as evidence of mesenteric edema and CT scan. Continue TPN.  Encourage oral nutrition Eventually may benefit from Lasix.  But currently will avoid it because of effective arterial hypovolemia.  Type 2 diabetes mellitus A1c 6.6 on 01/23/2022 PTA on metformin, Jardiance Currently on sliding scale insulin with Accu-Cheks every 4 hours.  Continue to monitor.   Recent Labs  Lab 02/20/22 2008 02/20/22 2308 02/21/22 0401 02/21/22 0728 02/21/22 1138  GLUCAP 161* 158* 143* 162* 053*   Acute metabolic encephalopathy Patient is slow to respond, clouded and disoriented. Expect mental status to improve with clinical improvement.  Impaired mobility Significant weakness.  Multifactorial: Low-level electrolytes, no oral intake, dehydration, physical deconditioning PT eval ordered.  H/o Essential HTN lisinopril remains on hold   HLD Lipitor remains on hold  Obesity -Body mass index is 38.42 kg/m. Patient has been advised to make an attempt to improve diet and exercise patterns to aid in weight loss.  OSA Baseline CPAP 10 cm H20, followed by Dr. Ander Slade CPAP QSH   NGT may impede mask seal    Hx of ADHD, Anxiety methylphenidate on hold   Hx of Hypogonadism. Was on outpatient testosterone       Goals of care   Code Status: Full Code    Mobility: PT follow-up needed.  Infusions:   sodium chloride 10 mL/hr at 02/21/22 1227   chlorproMAZINE (THORAZINE) 12.5 mg in sodium chloride 0.9 % 25 mL IVPB Stopped (02/14/22 0913)   potassium PHOSPHATE IVPB (in mmol) 85 mL/hr at 02/21/22 1227   TPN ADULT (ION) 75 mL/hr at 02/21/22 1227   TPN ADULT (ION)      Scheduled Meds:  Chlorhexidine Gluconate Cloth  6 each Topical Daily   enoxaparin (LOVENOX) injection  0.5 mg/kg Subcutaneous Daily   insulin aspart  0-20 Units Subcutaneous Q4H   metoCLOPramide (REGLAN) injection  5 mg Intravenous Q6H    pantoprazole (PROTONIX) IV  40 mg Intravenous Q24H   sodium chloride flush  10-40 mL Intracatheter Q12H    PRN meds: acetaminophen, chlorproMAZINE (THORAZINE) 12.5 mg in sodium chloride 0.9 % 25 mL IVPB, HYDROmorphone (DILAUDID) injection, ondansetron (ZOFRAN) IV, mouth rinse, sodium chloride flush   Skin assessment:     Nutritional status:  Body mass index is 38.42 kg/m.  Nutrition Problem: Severe Malnutrition Etiology: acute illness (new dx Crohn's colitis; bowel perforation) Signs/Symptoms: moderate fat depletion, moderate muscle depletion, percent weight loss Percent weight loss: 10 % (in 4 months)     Diet:  Diet Order             Diet regular Room service appropriate? Yes; Fluid consistency: Thin  Diet effective now                   DVT prophylaxis:  Place and maintain sequential compression device Start: 02/08/22 0001   Antimicrobials: IV meropenem and IV vancomycin course completed Fluid: TPN per general surgery.   Consultants: General surgery, GI Family Communication: Wife at bedside  Status is: Inpatient  Continue in-hospital care because: Gradually improving postop ileus. Level of care: ICU   Dispo: The patient is from: Home  Anticipated d/c is to: Pending clinical course              Patient currently is not medically stable to d/c.   Difficult to place patient No       Antimicrobials: Anti-infectives (From admission, onward)    Start     Dose/Rate Route Frequency Ordered Stop   02/15/22 1400  meropenem (MERREM) 1 g in sodium chloride 0.9 % 100 mL IVPB  Status:  Discontinued        1 g 200 mL/hr over 30 Minutes Intravenous Every 8 hours 02/15/22 1244 02/19/22 1709   02/15/22 1400  vancomycin (VANCOCIN) IVPB 1000 mg/200 mL premix  Status:  Discontinued        1,000 mg 200 mL/hr over 60 Minutes Intravenous Every 12 hours 02/15/22 1304 02/18/22 1445   02/08/22 1200  piperacillin-tazobactam (ZOSYN) IVPB 3.375 g  Status:   Discontinued        3.375 g 12.5 mL/hr over 240 Minutes Intravenous Every 8 hours 02/08/22 0505 02/15/22 1243   02/08/22 0500  piperacillin-tazobactam (ZOSYN) IVPB 3.375 g  Status:  Discontinued        3.375 g 12.5 mL/hr over 240 Minutes Intravenous Every 8 hours 02/08/22 0006 02/08/22 0505   02/08/22 0334  piperacillin-tazobactam (ZOSYN) 3.375 GM/50ML IVPB       Note to Pharmacy: Enis Gash W: cabinet override      02/08/22 0334 02/08/22 0345   02/07/22 2030  ceFEPIme (MAXIPIME) 2 g in sodium chloride 0.9 % 100 mL IVPB        2 g 200 mL/hr over 30 Minutes Intravenous  Once 02/07/22 2023 02/07/22 2248   02/07/22 2030  metroNIDAZOLE (FLAGYL) IVPB 500 mg        500 mg 100 mL/hr over 60 Minutes Intravenous  Once 02/07/22 2023 02/08/22 0506   02/07/22 2030  vancomycin (VANCOCIN) IVPB 1000 mg/200 mL premix        1,000 mg 200 mL/hr over 60 Minutes Intravenous  Once 02/07/22 2023 02/08/22 0506       Objective: Vitals:   02/21/22 1000 02/21/22 1200  BP: (!) 110/57 129/65  Pulse: (!) 107 (!) 106  Resp: (!) 23 (!) 26  Temp:    SpO2: 96% 100%    Intake/Output Summary (Last 24 hours) at 02/21/2022 1403 Last data filed at 02/21/2022 1227 Gross per 24 hour  Intake 4927.59 ml  Output 3325 ml  Net 1602.59 ml   Filed Weights   02/19/22 1800 02/20/22 0400 02/21/22 0500  Weight: 116.1 kg 118.3 kg 118 kg   Weight change: 1.9 kg Body mass index is 38.42 kg/m.   Physical Exam: General exam: Pleasant elderly Caucasian male.  Not in pain.  4 L oxygen by nasal cannula  Skin: No rashes, lesions or ulcers. HEENT: Atraumatic, normocephalic, no obvious bleeding.  NG tube remains clamped. Lungs: Diminished air entry in both bases.  No crackles or wheezing. CVS: Regular rate and rhythm, no murmur GI/Abd soft, mild postop appropriate tenderness, bowel sounds minimal.  Ostomy bag with liquid output. CNS: Eyes open, slow to answer questions.  Oriented to place. Psychiatry: Mood  appropriate Extremities: 2+ bilateral pedal edema, no calf tenderness  Data Review: I have personally reviewed the laboratory data and studies available.  F/u labs ordered Unresulted Labs (From admission, onward)     Start     Ordered   02/22/22 0500  Comprehensive metabolic panel  Tomorrow morning,   R  Question:  Specimen collection method  Answer:  Unit=Unit collect   02/21/22 1022   02/21/22 5643  Basic metabolic panel  Once-Timed,   TIMED       Question:  Specimen collection method  Answer:  Unit=Unit collect   02/21/22 1022   02/21/22 1600  Magnesium  Once-Timed,   TIMED       Question:  Specimen collection method  Answer:  Unit=Unit collect   02/21/22 1022   02/21/22 1600  Phosphorus  Once-Timed,   TIMED       Question:  Specimen collection method  Answer:  Unit=Unit collect   02/21/22 1022   02/21/22 0500  Comprehensive metabolic panel  (TPN Lab Panel)  Every Mon,Thu (0500),   R     Question:  Specimen collection method  Answer:  Unit=Unit collect   02/18/22 1037   02/21/22 0500  Magnesium  (TPN Lab Panel)  Every Mon,Thu (0500),   R     Question:  Specimen collection method  Answer:  Unit=Unit collect   02/18/22 1037   02/21/22 0500  Phosphorus  (TPN Lab Panel)  Every Mon,Thu (0500),   R     Question:  Specimen collection method  Answer:  Unit=Unit collect   02/18/22 1037   02/15/22 1234  Expectorated Sputum Assessment w Gram Stain, Rflx to Resp Cult  Once,   R        02/15/22 1233   02/14/22 0500  Triglycerides  (TPN Lab Panel)  Every Mon,Thu (0500),   R     Question:  Specimen collection method  Answer:  Lab=Lab collect   02/12/22 1801   02/13/22 0500  CBC with Differential/Platelet  Daily,   R     Question:  Specimen collection method  Answer:  Lab=Lab collect   02/12/22 0927   02/13/22 0500  Magnesium  Daily,   R     Question:  Specimen collection method  Answer:  Lab=Lab collect   02/12/22 0927   02/13/22 0500  Phosphorus  Daily,   R     Question:  Specimen  collection method  Answer:  Lab=Lab collect   02/12/22 0927   02/11/22 1800  Acid Fast Culture with reflexed sensitivities  (AFB smear + Culture w reflexed sensitivities panel)  3 times daily,   R,   Status:  Canceled     See Hyperspace for full Linked Orders Report.   02/11/22 1544            Signed, Terrilee Croak, MD [facility] 02/21/2022

## 2022-02-21 NOTE — Progress Notes (Signed)
Physical Therapy Treatment Patient Details Name: Richard [name] MRN: [id number] DOB: November 08, 1955 Today's Date: 02/21/2022   History of Present Illness Patient is a 66 year old male who was admitted on 11/23 with bowel preforation. patient underwent exploratogy lap, sigmoid resection and transverse colostomy. PMH: IBD, crohn's colitis,COVID July 2022, HLD, HTN, OSA, PNA, Seborrheic dermatitis, Hypogonadism, DM type 2, and peripheral neuopathy.    PT Comments     Patient participated in resistive exercises using blu and green theraband. Left bands at bedside, encouraged patient to use bands  every day for exercises.    Recommendations for follow up therapy are one component of a multi-disciplinary discharge planning process, led by the attending physician.  Recommendations may be updated based on patient status, additional functional criteria and insurance authorization.  Follow Up Recommendations  Acute inpatient rehab (3hours/day)     Assistance Recommended at Discharge Frequent or constant Supervision/Assistance  Patient can return home with the following Two people to help with walking and/or transfers;Assistance with cooking/housework;Assist for transportation;Help with stairs or ramp for entrance;Two people to help with bathing/dressing/bathroom   Equipment Recommendations  Rolling walker (2 wheels)    Recommendations for Other Services       Precautions / Restrictions Precautions Precaution Comments: L colostomy, abd binder Other Brace: binder was not in place     Mobility  Bed Mobility    Transfers  Ambulation/Gait                   Stairs             Wheelchair Mobility    Modified Rankin (Stroke Patients Only)       Balance                            Cognition Arousal/Alertness: Awake/alert Behavior During Therapy: WFL for tasks assessed/performed Overall Cognitive Status: Within Functional Limits for tasks assessed                                           Exercises General Exercises - Upper Extremity Shoulder Flexion: AROM, Both, 10 reps, Supine, Theraband Theraband Level (Shoulder Flexion): Level 3 (Green) Shoulder Extension: AROM, Both, 10 reps, Supine, Theraband Theraband Level (Shoulder Extension): Level 3 (Green) Shoulder ABduction: AROM, Both, 10 reps, Theraband Theraband Level (Shoulder Abduction): Level 3 (Green) Shoulder Horizontal ABduction: AROM, Both, 10 reps, Supine, Theraband Theraband Level (Shoulder Horizontal Abduction): Level 3 (Green) Elbow Flexion: AROM, 10 reps, Both, Theraband Theraband Level (Elbow Flexion): Level 3 (Green) Elbow Extension: AROM, 10 reps, Supine, Theraband Theraband Level (Elbow Extension): Level 3 (Green) General Exercises - Lower Extremity Short Arc Quad: AROM, Both, 10 reps, Supine Other Exercises Other Exercises: blue TB resisted leg extension x 10, both supine    General Comments        Pertinent Vitals/Pain Pain Assessment Pain Assessment: Faces Faces Pain Scale: Hurts little more Pain Location: abd Pain Descriptors / Indicators: Discomfort Pain Intervention(s): Monitored during session    Home Living Family/patient expects to be discharged to:: Private residence Living Arrangements: Spouse/significant other                      Prior Function            PT Goals (current goals can now be found in the care plan section) Progress  towards PT goals: Progressing toward goals    Frequency    Min 3X/week      PT Plan Current plan remains appropriate;Frequency needs to be updated    Co-evaluation              AM-PAC PT "6 Clicks" Mobility   Outcome Measure  Help needed turning from your back to your side while in a flat bed without using bedrails?: A Lot Help needed moving from lying on your back to sitting on the side of a flat bed without using bedrails?: Total Help needed moving to and from a bed to a  chair (including a wheelchair)?: Total Help needed standing up from a chair using your arms (e.g., wheelchair or bedside chair)?: Total Help needed to walk in hospital room?: Total Help needed climbing 3-5 steps with a railing? : Total 6 Click Score: 7    End of Session Equipment Utilized During Treatment: Gait belt Activity Tolerance: Patient tolerated treatment well Patient left: in bed;with call bell/phone within reach;with bed alarm set Nurse Communication: Mobility status;Need for lift equipment PT Visit Diagnosis: Difficulty in walking, not elsewhere classified (R26.2);Pain;Muscle weakness (generalized) (M62.81)     Time: 7425-9563 PT Time Calculation (min) (ACUTE ONLY): 29 min  Charges:  $Therapeutic Exercise: 23-37 mins                     [place] Office [phone] Weekend [phone]    Claretha Cooper 02/21/2022, 2:54 PM

## 2022-02-21 NOTE — Progress Notes (Addendum)
Daily Progress Note  Hospital Day: 15  Chief Complaint: new IBD, perforated bowel   Assessment   Patient profile:  INA POUPARD is a 66 y.o. male with a pmh of DM, HTN, HLD, OSA, hypogonadism, ADD, IBD ( recent dx) . Admitted with bowel perforation.     #66 yo male with newly diagnosed IBD ( non-specific IBD with active chronic and no granulomas on biopsies. Admitted with bowel bowel perforation s/p exploratory lap, sigmoid resection and transverse colostomy on 11/23. Hospital course complicated by post-op ileus and malnutrition He did spike a fever of 101.3 yesterday afternoon but afebrile since (without tylenol) and WBC is normal so not sure what happened there. Blood cx negative day 5 He is improving. Good ostomy output. NGT out for nearly 48 hours now. Had small amount of a regular diet today. Will remain on TNA until taking adequate PO intake.    # Macrocytic anemia.  No active bleeding. Hgb down about 1 gram over last several days.  Elevated B12 level   # Persistent tachycardia ( 103-110)   Plan:   Continue IV Reglan 5 mg Q 6 hours Plan for now is to start Remicade as outpatient  -----------------------------------------------------------------------------------------------  GI attending:  He continues to improve without vomiting and eating some food.  He is having increasing stool output so we reduced his Reglan which could be contributing versus Crohn's versus ileus recovery.   We will follow.   Gatha Mayer, MD, [facility] See [name] on call - gastroenterology for best contact person 02/21/2022 5:50 PM  Subjective   Feels okay. Ate 1/4 of a hamburger today. No nausea. Working with PT  Objective   Imaging:  CT ABDOMEN PELVIS W CONTRAST  Result Date: 02/19/2022 CLINICAL DATA:  Postoperative abdominal pain. EXAM: CT ABDOMEN AND PELVIS WITH CONTRAST TECHNIQUE: Multidetector CT imaging of the abdomen and pelvis was performed using  the standard protocol following bolus administration of intravenous contrast. RADIATION DOSE REDUCTION: This exam was performed according to the departmental dose-optimization program which includes automated exposure control, adjustment of the mA and/or kV according to patient size and/or use of iterative reconstruction technique. CONTRAST:  161m OMNIPAQUE IOHEXOL 300 MG/ML  SOLN COMPARISON:  February 15, 2022. FINDINGS: Lower chest: Small bilateral pleural effusions are noted with probable bibasilar subsegmental atelectasis, right greater than left. Hepatobiliary: No cholelithiasis or biliary dilatation is noted. No acute abnormality seen in the liver. Pancreas: Unremarkable. No pancreatic ductal dilatation or surrounding inflammatory changes. Spleen: Normal in size without focal abnormality. Adrenals/Urinary Tract: Adrenal glands are unremarkable. Kidneys are normal, without renal calculi, focal lesion, or hydronephrosis. Bladder is unremarkable. Stomach/Bowel: Nasogastric tube tip is seen in proximal stomach. Status post colectomy with colostomy seen in left lower quadrant. Continued presence moderately dilated small bowel loops most consistent with ileus or less likely obstruction. Wall thickening is seen involving the distal duodenum and proximal jejunum suggesting enteritis. Vascular/Lymphatic: Aortic atherosclerosis. No enlarged abdominal or pelvic lymph nodes. Reproductive: Prostate is unremarkable. Other: Mild amount of free fluid is noted throughout the abdomen which most likely is postoperative in etiology. No definite hernia is noted. Musculoskeletal: No acute or significant osseous findings. IMPRESSION: Status post colectomy with colostomy seen in left lower quadrant. Continued presence of moderately dilated small bowel most consistent with ileus or less likely distal small bowel obstruction. Wall and fold thickening of distal duodenum and proximal jejunum is noted suggesting possible edema or  enteritis. Mild amount of free fluid is  noted throughout the abdomen which most likely is postoperative in etiology. Small bilateral pleural effusions are noted with associated bibasilar subsegmental atelectasis, right greater than left. Electronically Signed   By: Marijo Conception M.D.   On: 02/19/2022 15:45   DG Abd Portable 1V  Result Date: 02/19/2022 CLINICAL DATA:  Ileus. EXAM: PORTABLE ABDOMEN - 1 VIEW COMPARISON:  02/17/2022 FINDINGS: Nasogastric tube unchanged as it is coiled once over the stomach with tip over the gastric fundus in the left upper quadrant. Air is present throughout the colon. There are multiple air-filled dilated small bowel loops measuring up to 4.4 cm over the right lower quadrant. No evidence of free peritoneal air. Degenerative changes of the spine and hips. IMPRESSION: Multiple air-filled dilated small bowel loops measuring up to 4.4 cm over the right lower quadrant. Findings may be due to ileus versus early/partial small bowel obstruction. Electronically Signed   By: Marin Olp M.D.   On: 02/19/2022 08:13   DG Abd Portable 1V  Result Date: 02/17/2022 CLINICAL DATA:  NG tube placement. EXAM: PORTABLE ABDOMEN - 1 VIEW COMPARISON:  02/10/2022 FINDINGS: An enteric tube is noted with coiled in the proximal-mid stomach. No other significant changes noted. IMPRESSION: Enteric tube with coiled in the proximal-mid stomach. Electronically Signed   By: Margarette Canada M.D.   On: 02/17/2022 17:21    Lab Results: Recent Labs    02/19/22 0453 02/20/22 0455 02/21/22 0445  WBC 5.2 5.0 4.5  HGB 7.6* 7.5* 7.4*  HCT 23.7* 23.9* 23.6*  PLT 255 257 268   BMET Recent Labs    02/19/22 1431 02/20/22 0455 02/21/22 0445  NA 137 136 141  K 3.5 3.9 2.8*  CL 103 103 113*  CO2 '29 28 23  '$ GLUCOSE 145* 169* 138*  BUN 6* 8 9  CREATININE 0.30* <0.30* <0.30*  CALCIUM 7.0* 7.4* 5.9*   LFT Recent Labs    02/21/22 0445  PROT 4.1*  ALBUMIN <1.5*  AST 16  ALT 10  ALKPHOS 54  BILITOT  0.3     Scheduled inpatient medications:   Chlorhexidine Gluconate Cloth  6 each Topical Daily   enoxaparin (LOVENOX) injection  0.5 mg/kg Subcutaneous Daily   insulin aspart  0-20 Units Subcutaneous Q4H   metoCLOPramide (REGLAN) injection  5 mg Intravenous Q6H   pantoprazole (PROTONIX) IV  40 mg Intravenous Q24H   sodium chloride flush  10-40 mL Intracatheter Q12H   Continuous inpatient infusions:   sodium chloride 10 mL/hr at 02/21/22 1227   chlorproMAZINE (THORAZINE) 12.5 mg in sodium chloride 0.9 % 25 mL IVPB Stopped (02/14/22 0913)   TPN ADULT (ION) 75 mL/hr at 02/21/22 1227   TPN ADULT (ION)     PRN inpatient medications: acetaminophen, chlorproMAZINE (THORAZINE) 12.5 mg in sodium chloride 0.9 % 25 mL IVPB, HYDROmorphone (DILAUDID) injection, ondansetron (ZOFRAN) IV, mouth rinse, sodium chloride flush  Vital signs in last 24 hours: Temp:  [97.9 F (36.6 C)-101.3 F (38.5 C)] 97.9 F (36.6 C) (12/07 0800) Pulse Rate:  [85-119] 106 (12/07 1200) Resp:  [15-26] 26 (12/07 1200) BP: (103-139)/(50-65) 129/65 (12/07 1200) SpO2:  [93 %-100 %] 100 % (12/07 1200) Weight:  [330 kg] 118 kg (12/07 0500) Last BM Date : 02/20/22  Intake/Output Summary (Last 24 hours) at 02/21/2022 1449 Last data filed at 02/21/2022 1227 Gross per 24 hour  Intake 4927.59 ml  Output 3325 ml  Net 1602.59 ml    Intake/Output from previous day: 12/06 0701 - 12/07 0700 In: 10 [  I.V.:10] Out: 3875 [Urine:1250; Stool:2625] Intake/Output this shift: Total I/O In: 4917.6 [P.O.:240; I.V.:4249.8; IV Piggyback:427.8] Out: 700 [Urine:500; Stool:200]   Physical Exam:  General: Alert male in NAD Heart:  Regular rate and rhythm.  Pulmonary: Normal respiratory effort Abdomen: Soft, moderate distention, appropriately tender.  Normal bowel sounds. Ostomy bag is nearly full of liquid green stool Extremities: BLE pitting edema.  Neurologic: Alert and oriented Psych: Pleasant. Cooperative.    Principal  Problem:   Perforated abdominal viscus Active Problems:   Protein-calorie malnutrition, severe   Ileus, postoperative ([place])   Crohn's disease of colon with complication ([place])     LOS: 13 days   Tye Savoy ,NP 02/21/2022, 2:49 PM

## 2022-02-21 NOTE — Progress Notes (Signed)
Progress Note  14 Days Post-Op  Subjective: Did well with liquids yesterday.  Having bowel function.  Objective: Vital signs in last 24 hours: Temp:  [97.9 F (36.6 C)-101.3 F (38.5 C)] 97.9 F (36.6 C) (12/07 0000) Pulse Rate:  [85-130] 96 (12/07 0800) Resp:  [15-31] 21 (12/07 0800) BP: (103-139)/(50-65) 114/60 (12/07 0800) SpO2:  [89 %-100 %] 99 % (12/07 0800) Weight:  [979 kg] 118 kg (12/07 0500) Last BM Date : 02/20/22  Intake/Output from previous day: 12/06 0701 - 12/07 0700 In: 10 [I.V.:10] Out: 3875 [Urine:1250; Stool:2625] Intake/Output this shift: No intake/output data recorded.  PE: General: pleasant, WD, obese malewho is laying in bed in NAD Heart: regular, rate, and rhythm.   Lungs:Respiratory effort nonlabored Abd: soft, appropriately ttp, moderately distended, midline wound with healthy with beefy red granulation tissue, ostomy with some ulcerations but mostly viable with small amount liquid stool in bag   Lab Results:  Recent Labs    02/20/22 0455 02/21/22 0445  WBC 5.0 4.5  HGB 7.5* 7.4*  HCT 23.9* 23.6*  PLT 257 268    BMET Recent Labs    02/20/22 0455 02/21/22 0445  NA 136 141  K 3.9 2.8*  CL 103 113*  CO2 28 23  GLUCOSE 169* 138*  BUN 8 9  CREATININE <0.30* <0.30*  CALCIUM 7.4* 5.9*    PT/INR No results for input(s): "LABPROT", "INR" in the last 72 hours. CMP     Component Value Date/Time   NA 141 02/21/2022 0445   K 2.8 (L) 02/21/2022 0445   CL 113 (H) 02/21/2022 0445   CO2 23 02/21/2022 0445   GLUCOSE 138 (H) 02/21/2022 0445   BUN 9 02/21/2022 0445   CREATININE <0.30 (L) 02/21/2022 0445   CREATININE 0.63 (L) 03/10/2019 1436   CALCIUM 5.9 (LL) 02/21/2022 0445   PROT 4.1 (L) 02/21/2022 0445   ALBUMIN <1.5 (L) 02/21/2022 0445   AST 16 02/21/2022 0445   ALT 10 02/21/2022 0445   ALKPHOS 54 02/21/2022 0445   BILITOT 0.3 02/21/2022 0445   GFRNONAA NOT CALCULATED 02/21/2022 0445   Lipase  No results found for:  "LIPASE"     Studies/Results: CT ABDOMEN PELVIS W CONTRAST  Result Date: 02/19/2022 CLINICAL DATA:  Postoperative abdominal pain. EXAM: CT ABDOMEN AND PELVIS WITH CONTRAST TECHNIQUE: Multidetector CT imaging of the abdomen and pelvis was performed using the standard protocol following bolus administration of intravenous contrast. RADIATION DOSE REDUCTION: This exam was performed according to the departmental dose-optimization program which includes automated exposure control, adjustment of the mA and/or kV according to patient size and/or use of iterative reconstruction technique. CONTRAST:  139m OMNIPAQUE IOHEXOL 300 MG/ML  SOLN COMPARISON:  February 15, 2022. FINDINGS: Lower chest: Small bilateral pleural effusions are noted with probable bibasilar subsegmental atelectasis, right greater than left. Hepatobiliary: No cholelithiasis or biliary dilatation is noted. No acute abnormality seen in the liver. Pancreas: Unremarkable. No pancreatic ductal dilatation or surrounding inflammatory changes. Spleen: Normal in size without focal abnormality. Adrenals/Urinary Tract: Adrenal glands are unremarkable. Kidneys are normal, without renal calculi, focal lesion, or hydronephrosis. Bladder is unremarkable. Stomach/Bowel: Nasogastric tube tip is seen in proximal stomach. Status post colectomy with colostomy seen in left lower quadrant. Continued presence moderately dilated small bowel loops most consistent with ileus or less likely obstruction. Wall thickening is seen involving the distal duodenum and proximal jejunum suggesting enteritis. Vascular/Lymphatic: Aortic atherosclerosis. No enlarged abdominal or pelvic lymph nodes. Reproductive: Prostate is unremarkable. Other: Mild amount of  free fluid is noted throughout the abdomen which most likely is postoperative in etiology. No definite hernia is noted. Musculoskeletal: No acute or significant osseous findings. IMPRESSION: Status post colectomy with colostomy seen  in left lower quadrant. Continued presence of moderately dilated small bowel most consistent with ileus or less likely distal small bowel obstruction. Wall and fold thickening of distal duodenum and proximal jejunum is noted suggesting possible edema or enteritis. Mild amount of free fluid is noted throughout the abdomen which most likely is postoperative in etiology. Small bilateral pleural effusions are noted with associated bibasilar subsegmental atelectasis, right greater than left. Electronically Signed   By: Marijo Conception M.D.   On: 02/19/2022 15:45    Anti-infectives: Anti-infectives (From admission, onward)    Start     Dose/Rate Route Frequency Ordered Stop   02/15/22 1400  meropenem (MERREM) 1 g in sodium chloride 0.9 % 100 mL IVPB  Status:  Discontinued        1 g 200 mL/hr over 30 Minutes Intravenous Every 8 hours 02/15/22 1244 02/19/22 1709   02/15/22 1400  vancomycin (VANCOCIN) IVPB 1000 mg/200 mL premix  Status:  Discontinued        1,000 mg 200 mL/hr over 60 Minutes Intravenous Every 12 hours 02/15/22 1304 02/18/22 1445   02/08/22 1200  piperacillin-tazobactam (ZOSYN) IVPB 3.375 g  Status:  Discontinued        3.375 g 12.5 mL/hr over 240 Minutes Intravenous Every 8 hours 02/08/22 0505 02/15/22 1243   02/08/22 0500  piperacillin-tazobactam (ZOSYN) IVPB 3.375 g  Status:  Discontinued        3.375 g 12.5 mL/hr over 240 Minutes Intravenous Every 8 hours 02/08/22 0006 02/08/22 0505   02/08/22 0334  piperacillin-tazobactam (ZOSYN) 3.375 GM/50ML IVPB       Note to Pharmacy: Enis Gash W: cabinet override      02/08/22 0334 02/08/22 0345   02/07/22 2030  ceFEPIme (MAXIPIME) 2 g in sodium chloride 0.9 % 100 mL IVPB        2 g 200 mL/hr over 30 Minutes Intravenous  Once 02/07/22 2023 02/07/22 2248   02/07/22 2030  metroNIDAZOLE (FLAGYL) IVPB 500 mg        500 mg 100 mL/hr over 60 Minutes Intravenous  Once 02/07/22 2023 02/08/22 0506   02/07/22 2030  vancomycin (VANCOCIN) IVPB  1000 mg/200 mL premix        1,000 mg 200 mL/hr over 60 Minutes Intravenous  Once 02/07/22 2023 02/08/22 0506        Assessment/Plan  Sigmoid perforation  S/P exploratory laparotomy, colon resection, end colostomy 02/07/22 Dr. [name] - Advance to regular diet today but continue TPN as PO intake has not been great yet. - CT 12/1 with some free fluid but no organized collection, CT 12/5 with no major changes - GI following as well for IBD, fairly new diagnosis  FEN: FLD, TPN VTE: LMWH ID: vanc/flagyl/cefepime 11/23; zosyn 11/24>11/29; vanc 12/1>12/4; merrem 12/1>> 12/5  - below per TRH -  Post-operative respiratory insufficiency T2DM Physical deconditioning  Acute metabolic encephalopathy, improving  HTN HLD Obesity class I OSA Hypogonadism Moderate protein calorie malnutrition  ADHD/Anxiety  LOS: 13 days    Felicie Morn, MD [facility] 02/21/2022, 8:17 AM Please see Amion for pager number during day hours 7:00am-4:30pm

## 2022-02-21 NOTE — Progress Notes (Signed)
Pt refused CPAP qhs.  Pt states that he has to sleep on his side in order to wear it comfortably and he wants to stay on his back and watch tv.  I offered to come back when he was done watching tv for the night but he declined.  Pt encouraged to have his nurse contact RT should he change his mind.

## 2022-02-21 NOTE — Progress Notes (Signed)
PHARMACY - TOTAL PARENTERAL NUTRITION CONSULT NOTE   Indication: Prolonged ileus  Patient Measurements: Height: '5\' 9"'$  (175.3 cm) Weight: 118 kg (260 lb 2.3 oz) IBW/kg (Calculated) : 70.7 TPN AdjBW (KG): 80.3 Body mass index is 38.42 kg/m.  Assessment: 66 yo M s/p colonic perforation status post exploratory lap, partial colectomy and transverse colostomy 02/08/2022.  PMHx Scrotal abscess August 2023, Colon polyp, Diverticulosis, ADHD, Anxiety, Rhinitis, COVID July 2022, HLD, HTN, OSA, PNA, Seborrheic dermatitis, Hypogonadism, DM type 2, Peripheral neuropathy, former smoker.   Pharmacy consulted to manage TPN for prolonged ileus.    Glucose / Insulin: hx DM2 on metformin, Jardiance PTA - Goal CBG < 180. CBGs 142-194 - rSSI used/ 24 hrs: 22 units Electrolytes: Na improved to 141 (max TPN conc), K 2.8, Phos 1.8 (decreased despite max TPN conc. Plus replacement outside of TPN:  NaPhos 30 mmol on 12/3, 12/4, 12/5.  KPhos 15 mmol on 12/6), Mag 1.5, CorrCa 7.9 - Goal K > 4, Mag > 2 for ileus - 12/7:  large ostomy output in last 24 hours may explain the drop in K and other lytes, less likely to be refeeding this far out from TPN start.  No further Ca correction outside of TPN - extremely low albumin corrects Ca to 7.9 and and high Phos concentration in the TPN would increase risk of Ca/Phos precipitation.    Renal: SCr <1, BUN 8.  Decreased UOP.  Hepatic: LFTs, Tbili WNL, Trig 116, Alb <1.5 Intake / Output; I/O -3.8 L/ 24 hrs - NG removed 12/5. Stool inc to 2625 mL/24 hrs. UOP 1250 mL/24 hrs.  - MIVF: 0.45% NaCl GI Imaging:  11/28 CT A/P: post op ileus 12/5 CT:  Continued ileus or less likely distal SBO.  Possible edema or enteritis. Mild amount of free fluid is noted throughout the abdomen which most likely is postoperative  GI Surgeries / Procedures:  02/08/22:  colonic perforation status post exploratory lap, partial colectomy and transverse colostomy   Central access: PICC placed 11/28  for TPN TPN start date: 02/13/22  Nutritional Goals: 12/1 Goal TPN rate is 100 mL/hr to provide 142 g of protein and 2390 kcals per day  12/7 recalculated Goal TPN rate is 120 mL/hr to provide 144 g of protein and 2471 kcals per day  RD Assessment: Estimated Needs Total Energy Estimated Needs: 2300-2600 kcals Total Protein Estimated Needs: 135-155 grams Total Fluid Estimated Needs: >/= 2.3L  Current Nutrition:  Regular diet (start 12/7) and TPN  Plan:  Now:   KPhos 30 mMol IV x 1 (provides ~ 44 mEq K+) Magnesium 4g bolus x1   At 1800: Advance TPN to 100 ml/hr with updated formula  Electrolytes in TPN: Na 75 mEq/L, K 45 mEq/L, Ca 5 mEq/L, Mg 8 mEq/L, Phos 25 mmol/L. Cl:Ac 1:2 Add standard MVI and trace elements to TPN Add thiamine to TPN  Continue Resistant q4h SSI and adjust as needed  mIVF per MD  Monitor TPN labs on Mon/Thurs and PRN.   Afternoon plan:  16:00 Bmet, Mg, and Phos  Pharmacy to provide further replacement if needed prior to next TPN bag due at 18:00.  Thank you for allowing pharmacy to be a part of this patient's care.  Gretta Arab PharmD, BCPS [facility] main pharmacy [phone] 02/21/2022 7:48 AM

## 2022-02-22 DIAGNOSIS — K9189 Other postprocedural complications and disorders of digestive system: Secondary | ICD-10-CM | POA: Diagnosis not present

## 2022-02-22 DIAGNOSIS — K567 Ileus, unspecified: Secondary | ICD-10-CM | POA: Diagnosis not present

## 2022-02-22 DIAGNOSIS — R198 Other specified symptoms and signs involving the digestive system and abdomen: Secondary | ICD-10-CM | POA: Diagnosis not present

## 2022-02-22 DIAGNOSIS — K50119 Crohn's disease of large intestine with unspecified complications: Secondary | ICD-10-CM | POA: Diagnosis not present

## 2022-02-22 LAB — CBC WITH DIFFERENTIAL/PLATELET
Abs Immature Granulocytes: 0.07 10*3/uL (ref 0.00–0.07)
Basophils Absolute: 0 10*3/uL (ref 0.0–0.1)
Basophils Relative: 0 %
Eosinophils Absolute: 0.2 10*3/uL (ref 0.0–0.5)
Eosinophils Relative: 4 %
HCT: 22.6 % — ABNORMAL LOW (ref 39.0–52.0)
Hemoglobin: 7.1 g/dL — ABNORMAL LOW (ref 13.0–17.0)
Immature Granulocytes: 2 %
Lymphocytes Relative: 10 %
Lymphs Abs: 0.5 10*3/uL — ABNORMAL LOW (ref 0.7–4.0)
MCH: 33 pg (ref 26.0–34.0)
MCHC: 31.4 g/dL (ref 30.0–36.0)
MCV: 105.1 fL — ABNORMAL HIGH (ref 80.0–100.0)
Monocytes Absolute: 0.4 10*3/uL (ref 0.1–1.0)
Monocytes Relative: 8 %
Neutro Abs: 3.7 10*3/uL (ref 1.7–7.7)
Neutrophils Relative %: 76 %
Platelets: 260 10*3/uL (ref 150–400)
RBC: 2.15 MIL/uL — ABNORMAL LOW (ref 4.22–5.81)
RDW: 14.5 % (ref 11.5–15.5)
WBC: 4.7 10*3/uL (ref 4.0–10.5)
nRBC: 0 % (ref 0.0–0.2)

## 2022-02-22 LAB — GLUCOSE, CAPILLARY
Glucose-Capillary: 160 mg/dL — ABNORMAL HIGH (ref 70–99)
Glucose-Capillary: 152 mg/dL — ABNORMAL HIGH (ref 70–99)
Glucose-Capillary: 149 mg/dL — ABNORMAL HIGH (ref 70–99)
Glucose-Capillary: 175 mg/dL — ABNORMAL HIGH (ref 70–99)
Glucose-Capillary: 161 mg/dL — ABNORMAL HIGH (ref 70–99)

## 2022-02-22 LAB — COMPREHENSIVE METABOLIC PANEL
ALT: 15 U/L (ref 0–44)
AST: 24 U/L (ref 15–41)
Albumin: <1.5 g/dL — ABNORMAL LOW (ref 3.5–5.0)
Alkaline Phosphatase: 93 U/L (ref 38–126)
Anion gap: 5 (ref 5–15)
BUN: 9 mg/dL (ref 8–23)
CO2: 28 mmol/L (ref 22–32)
Calcium: 7.1 mg/dL — ABNORMAL LOW (ref 8.9–10.3)
Chloride: 99 mmol/L (ref 98–111)
Creatinine, Ser: 0.33 mg/dL — ABNORMAL LOW (ref 0.61–1.24)
GFR, Estimated: >60 mL/min (ref >60)
Glucose, Bld: 162 mg/dL — ABNORMAL HIGH (ref 70–99)
Potassium: 4.1 mmol/L (ref 3.5–5.1)
Sodium: 132 mmol/L — ABNORMAL LOW (ref 135–145)
Total Bilirubin: 0.2 mg/dL — ABNORMAL LOW (ref 0.3–1.2)
Total Protein: 5.3 g/dL — ABNORMAL LOW (ref 6.5–8.1)

## 2022-02-22 LAB — PHOSPHORUS: Phosphorus: 2.4 mg/dL — ABNORMAL LOW (ref 2.5–4.6)

## 2022-02-22 LAB — MAGNESIUM: Magnesium: 1.8 mg/dL (ref 1.7–2.4)

## 2022-02-22 MED ORDER — ENOXAPARIN SODIUM 60 MG/0.6ML IJ SOSY
60.0000 mg | PREFILLED_SYRINGE | Freq: Every day | INTRAMUSCULAR | Status: DC
  Administered 2022-02-22 – 2022-02-25 (×4): 60 mg via SUBCUTANEOUS
  Filled 2022-02-22 (×4): qty 0.6

## 2022-02-22 MED ORDER — THIAMINE MONONITRATE 100 MG PO TABS
100.0000 mg | ORAL_TABLET | Freq: Every day | ORAL | Status: DC
  Administered 2022-02-22 – 2022-03-01 (×8): 100 mg via ORAL
  Filled 2022-02-22 (×8): qty 1

## 2022-02-22 MED ORDER — TRAVASOL 10 % IV SOLN: INTRAVENOUS | Status: DC

## 2022-02-22 MED ORDER — SODIUM PHOSPHATES 45 MMOLE/15ML IV SOLN
15.0000 mmol | Freq: Once | INTRAVENOUS | Status: AC
  Administered 2022-02-22: 15 mmol via INTRAVENOUS
  Filled 2022-02-22: qty 5

## 2022-02-22 MED ORDER — ENSURE ENLIVE PO LIQD
237.0000 mL | ORAL | Status: DC
  Administered 2022-02-22 – 2022-02-24 (×3): 237 mL via ORAL
  Filled 2022-02-22 (×2): qty 237

## 2022-02-22 MED ORDER — ADULT MULTIVITAMIN W/MINERALS CH
1.0000 | ORAL_TABLET | Freq: Every day | ORAL | Status: DC
  Administered 2022-02-22 – 2022-03-01 (×8): 1 via ORAL
  Filled 2022-02-22 (×8): qty 1

## 2022-02-22 MED ORDER — TRAVASOL 10 % IV SOLN
INTRAVENOUS | Status: AC
  Filled 2022-02-22: qty 600

## 2022-02-22 MED ORDER — TRAVASOL 10 % IV SOLN
INTRAVENOUS | Status: DC
  Filled 2022-02-22: qty 1440

## 2022-02-22 MED ORDER — PANTOPRAZOLE SODIUM 40 MG PO TBEC
40.0000 mg | DELAYED_RELEASE_TABLET | Freq: Every day | ORAL | Status: DC
  Administered 2022-02-22 – 2022-03-01 (×8): 40 mg via ORAL
  Filled 2022-02-22 (×8): qty 1

## 2022-02-22 MED ORDER — MAGNESIUM SULFATE 2 GM/50ML IV SOLN
2.0000 g | Freq: Once | INTRAVENOUS | Status: AC
  Administered 2022-02-22: 2 g via INTRAVENOUS
  Filled 2022-02-22: qty 50

## 2022-02-22 NOTE — Progress Notes (Signed)
Brief Pharmacy Note - TPN  Consult received to wean TPN.  Clarified with Dr. Thermon Leyland, since new bag already compounded for today, will decrease rate to 90m/hr today and stop after it is complete at 6pm tomorrow 12/9.  EPeggyann Juba PharmD, BCPS 02/22/2022 3:09 PM

## 2022-02-22 NOTE — Progress Notes (Addendum)
Nutrition Follow-up  DOCUMENTATION CODES:   Severe malnutrition in context of acute illness/injury  INTERVENTION:  - Continue Regular diet per MD. - Ensure Enlive po once daily to support oral intake, provides 350 kcal and 20 grams of protein. - Encourage intake as tolerated.  - 48 hour calorie count to start today (12/8) at breakfast and end after dinner tomorrow (12/9). Discussed with RN.  - Patient reported some weariness of what foods to eat with new colostomy. Diet education with handout provided for colostomy nutrition therapy.  - Per pharmacy, plan to increase to goal TPN tonight at 140m/hr: 144g protein, 345g CHO (GIR 1.96), and 720 lipid cals which provides 2471kcal/day - TPN management per pharmacy - Monitor electrolytes and replete as needed.   - Monitor weights to trend.   NUTRITION DIAGNOSIS:   Severe Malnutrition related to acute illness (new dx Crohn's colitis; bowel perforation) as evidenced by moderate fat depletion, moderate muscle depletion, percent weight loss. *ongoing  GOAL:   Patient will meet greater than or equal to 90% of their needs *being met with TPN  MONITOR:   Labs, Weight trends, Diet advancement  REASON FOR ASSESSMENT:   Consult Calorie Count  ASSESSMENT:   66y/o male with PMH HTN, HLD, DMII, diverticulosis, and newly diagnosed Crohn's colitis who developed diarrhea about 3 to 4 weeks prior to admission and presented 11/24 with worsening abdominal pain, found to have bowel perforation.  11/24 admit, S/p exploratory lap, mobilization of the splenic flexure, descending and sigmoid colon resection and transverse colostomy; NG placed 11/26 having bowel function from ostomy, NG removed; clear liquid diet             Later in day patient had N/V, NG reinserted and 2L bilious fluid removed 11/28 Failed NG clamp trial, PICC placed-plan to start TPN 11/29 TPN started 11/30 TPN continued at same rate due to refeeding/electrolytes not  stable 12/2 TPN stopped in evening d/t hyperkalemia, D10W ordered 12/3 TPN resumed 12/4 TPN continued, increased; clear liquid diet started 12/7 Diet advanced to Regular 12/8 TPN to advance to goal, calorie count to start  Received consult to start calorie count to assess patient's intake for ability to wean TPN.  Met with patient and family at bedside. Patient states he has been trying to eat something at each meal but admits his appetite isn't great and has also been weary of what to eat after colostomy. Documented to have had 100% of breakfast yesterday but no other meals documented.  Provided patient and wife with handout for colostomy management which detailed foods recommended and those not recommended as well as tips for eating habits and what foods can cause blockage, gas, and odor.   Patient hesitantly agreeable to try Ensure to support intake. Provided patient with Ensure to try at bedside, over ice and with lid for best tolerance, and patient feels he could drink 1 per day. Order placed. Stressed importance of trying to eat well to meet needs and come off TPN.   TPN to remain until patient proves adequate intake.  Per pharmacy, TPN increased to goal tonight which will provide 2471 calories and 144g protein.   Discussed plan to start calorie count with RN. RN to document intake at all meals.   Medications reviewed and include: Insulin, MVI, Thiamine  Labs reviewed:  Na 132 Phosphorus 2.4 Triglycerides (12/7) 144 Blood Glucose 152-186 x24 hours    NUTRITION - FOCUSED PHYSICAL EXAM:  Diet Order:   Diet Order  Diet regular Room service appropriate? Yes; Fluid consistency: Thin  Diet effective now                   EDUCATION NEEDS:  Education needs have been addressed  Skin:  Skin Assessment: Reviewed RN Assessment  Last BM:  12/7 - colostomy  Height:  Ht Readings from Last 1 Encounters:  02/08/22 5' 9" (1.753 m)   Weight:  Wt Readings from  Last 1 Encounters:  02/22/22 122.5 kg   Weight trends since admit: 02/22/22 0500 122.5 kg 270.06 lbs  02/21/22 0500 118 kg 260.14 lbs  02/20/22 0400 118.3 kg 260.8 lbs  02/19/22 1800 116.1 kg 255.95 lbs  02/19/22 0400 116.1 kg 255.95 lbs  02/18/22 0500 115.8 kg 255.29 lbs  02/13/22 0436 104.5 kg 230.38 lbs  02/12/22 1801 105 kg 231.48 lbs  02/08/22 0500 109.1 kg 240.52 lbs    BMI:  Body mass index is 39.88 kg/m.  Estimated Nutritional Needs:  Kcal:  2300-2600 kcals Protein:  135-155 grams Fluid:  >/= 2.3L     RD, LDN For contact information, refer to AMiON.    

## 2022-02-22 NOTE — Progress Notes (Addendum)
PHARMACY - TOTAL PARENTERAL NUTRITION CONSULT NOTE   Indication: Prolonged ileus  Patient Measurements: Height: '5\' 9"'$  (175.3 cm) Weight: 122.5 kg (270 lb 1 oz) IBW/kg (Calculated) : 70.7 TPN AdjBW (KG): 80.3 Body mass index is 39.88 kg/m.  Assessment: 66 yo M s/p colonic perforation status post exploratory lap, partial colectomy and transverse colostomy 02/08/2022.  PMHx Scrotal abscess August 2023, Colon polyp, Diverticulosis, ADHD, Anxiety, Rhinitis, COVID July 2022, HLD, HTN, OSA, PNA, Seborrheic dermatitis, Hypogonadism, DM type 2, Peripheral neuropathy, former smoker.   Pharmacy consulted to manage TPN for prolonged ileus.    Glucose / Insulin: hx DM2 on metformin, Jardiance PTA - Goal CBG < 180. CBGs 155- 186 - rSSI used/ 24 hrs: 24 units Electrolytes: Na 132, K 4,1, Phos 1.8, Mag 1.8, CorrCa 9.2 - Goal K > 4, Mag > 2 for ileus Renal: SCr <1, BUN WNL.   Hepatic: LFTs, Tbili WNL, Trig 144 (12/7), Alb <1.5 Intake / Output; I/O +2783 L/ 24 hrs - NG removed 12/5. Stool 1600 ml/24 hrs. UOP 1200 mL/24 hrs.  - MIVF: 0.45% NaCl: variable rate 92-154 ml/hr, currently at 129 ml/hr>> will DC 12/8 am GI Imaging:  11/28 CT A/P: post op ileus 12/5 CT:  Continued ileus or less likely distal SBO.  Possible edema or enteritis. Mild amount of free fluid is noted throughout the abdomen which most likely is postoperative  GI Surgeries / Procedures:  02/08/22:  colonic perforation status post exploratory lap, partial colectomy and transverse colostomy   Central access: PICC placed 11/28 for TPN TPN start date: 02/13/22  Nutritional Goals: 12/1 Goal TPN rate is 100 mL/hr to provide 142 g of protein and 2390 kcals per day  12/7 recalculated Goal TPN rate is 120 mL/hr to provide 144 g of protein and 2471 kcals per day  RD Assessment: Estimated Needs Total Energy Estimated Needs: 2300-2600 kcals Total Protein Estimated Needs: 135-155 grams Total Fluid Estimated Needs: >/= 2.3L  Current  Nutrition:  Regular diet (start 12/7) and TPN Ate 100% of 1 meal 12/7  Plan:  Now:   Magnesium 2g bolus x1 NaPhos 15 mMol IV x 1   At 1800: Advance TPN to goal rate of 120 ml/hr   Electrolytes in TPN: Na 80 mEq/L, K 45 mEq/L, Ca 5 mEq/L, Mg 8 mEq/L, Phos 25 mmol/L. Cl:Ac 1:2 Change MVI to PO Change thiamine to PO Change PPI to PO Continue Resistant q4h SSI and adjust as needed  DC variable rate mIVF ordered by renal 12/3. Further IVF per MD Monitor TPN labs on Mon/Thurs and PRN. BMET, Mg & Phos in am  Thank you for allowing pharmacy to be a part of this patient's care.  Eudelia Bunch, Pharm.D Use secure chat for questions 02/22/2022 7:30 AM

## 2022-02-22 NOTE — Progress Notes (Addendum)
Occupational Therapy Treatment Patient Details Name: Richard [name] MRN: [id number] DOB: November 15, 1955 Today's Date: 02/22/2022   History of present illness Patient is a 66 year old male who was admitted on 11/23 with bowel preforation. patient underwent exploratogy lap, sigmoid resection and transverse colostomy. PMH: IBD, crohn's colitis,COVID July 2022, HLD, HTN, OSA, PNA, Seborrheic dermatitis, Hypogonadism, DM type 2, and peripheral neuopathy.   OT comments  Patient presents today supine in bed. His affect is very flat and he appears depressed. He requires step by step instructions and encouragement as well as explanation for all tasks.  Patient able to stand with use of stedy today with min assist x 2 in order to transfer to recliner. Also only min assist to transfer to side of bed with hand hold to pull up on and extra time. He requested to be placed close to the window so he could see outside. Therapist had patient use his feet and arms to help propel himself in recliner towards window with about 25% effort. He performed UE exercises with green band. Improved physical abilities today. Therapist concerned about depressed mood effecting motivation.    Recommendations for follow up therapy are one component of a multi-disciplinary discharge planning process, led by the attending physician.  Recommendations may be updated based on patient status, additional functional criteria and insurance authorization.    Follow Up Recommendations  Acute inpatient rehab (3hours/day)     Assistance Recommended at Discharge Frequent or constant Supervision/Assistance  Patient can return home with the following  Two people to help with walking and/or transfers;A lot of help with bathing/dressing/bathroom;Assistance with cooking/housework;Direct supervision/assist for medications management;Assist for transportation;Direct supervision/assist for financial management;Help with stairs or ramp for entrance    Equipment Recommendations  Other (comment) (TBD)    Recommendations for Other Services      Precautions / Restrictions Precautions Precaution Comments: L colostomy, monitor o2/HR Restrictions Weight Bearing Restrictions: No       Mobility Bed Mobility Overal bed mobility: Needs Assistance   Rolling: Min assist Sidelying to sit: Min assist, HOB elevated       General bed mobility comments: Needed a lot of encouragement and increased time to physicall move himself. Overall min assist to pull up on therapist hand to come into sitting.    Transfers Overall transfer level: Needs assistance Equipment used: Rolling walker (2 wheels) Transfers: Sit to/from Stand Sit to Stand: Min assist, +2 physical assistance, From elevated surface           General transfer comment: Totay only min x 2 to on each site to stand with use of stedy and patient pulling up on stedy bar - once to stand from bed then to stand for placement in chair. Transfer via Lift Equipment: Stedy   Balance Overall balance assessment: Needs assistance Sitting-balance support: No upper extremity supported, Feet supported Sitting balance-Leahy Scale: Fair     Standing balance support: During functional activity, Reliant on assistive device for balance Standing balance-Leahy Scale: Poor                             ADL either performed or assessed with clinical judgement   ADL                                              Extremity/Trunk Assessment Upper  Extremity Assessment Upper Extremity Assessment: Overall WFL for tasks assessed   Lower Extremity Assessment Lower Extremity Assessment: Defer to PT evaluation RLE Sensation: history of peripheral neuropathy   Cervical / Trunk Assessment Cervical / Trunk Exceptions: colostomy    Vision Baseline Vision/History: 1 Wears glasses     Perception     Praxis      Cognition Arousal/Alertness: Awake/alert Behavior During  Therapy: Flat affect Overall Cognitive Status: Within Functional Limits for tasks assessed                                          Exercises Other Exercises Other Exercises: Bicep flexion/extension x 10 each arm with green band    Shoulder Instructions       General Comments      Pertinent Vitals/ Pain       Pain Assessment Pain Assessment: No/denies pain  Home Living                                          Prior Functioning/Environment              Frequency  Min 2X/week        Progress Toward Goals  OT Goals(current goals can now be found in the care plan section)  Progress towards OT goals: Progressing toward goals  Acute Rehab OT Goals Patient Stated Goal: to return to work OT Goal Formulation: With patient/family Time For Goal Achievement: 02/26/22 Potential to Achieve Goals: [place] Discharge plan remains appropriate    Co-evaluation                 AM-PAC OT "6 Clicks" Daily Activity     Outcome Measure   Help from another person eating meals?: A Little Help from another person taking care of personal grooming?: A Little Help from another person toileting, which includes using toliet, bedpan, or urinal?: Total Help from another person bathing (including washing, rinsing, drying)?: A Lot Help from another person to put on and taking off regular upper body clothing?: A Little Help from another person to put on and taking off regular lower body clothing?: Total 6 Click Score: 13    End of Session Equipment Utilized During Treatment: Other (comment) (stedy)  OT Visit Diagnosis: Unsteadiness on feet (R26.81);Other abnormalities of gait and mobility (R26.89);Pain;Muscle weakness (generalized) (M62.81)   Activity Tolerance Patient tolerated treatment well   Patient Left in chair;with call bell/phone within reach;with family/visitor present   Nurse Communication Mobility status        Time:  5284-1324 OT Time Calculation (min): 32 min  Charges: OT General Charges $OT Visit: 1 Visit OT Treatments $Therapeutic Activity: 8-22 mins $Therapeutic Exercise: 8-22 mins  Gustavo Lah, OTR/L Acute Care Rehab Services  Office [phone]   Lenward Chancellor 02/22/2022, 12:13 PM

## 2022-02-22 NOTE — TOC Progression Note (Signed)
Transition of Care [facility]) - Progression Note    Patient Details  Name: Richard [name] MRN: [id number] Date of Birth: 1955/10/13  Transition of Care [facility]) CM/SW Contact  Holly Iannaccone, Juliann Pulse, RN Phone Number: 02/22/2022, 10:32 AM  Clinical Narrative: Noted CIR recc-await CIR rep screening.      Expected Discharge Plan: IP Rehab Facility Barriers to Discharge: Continued Medical Work up  Expected Discharge Plan and Services Expected Discharge Plan: [place]   Discharge Planning Services: CM Consult   Living arrangements for the past 2 months: Single Family Home                           HH Arranged: PT [place]: [place] Date [place]: 02/13/22 Time [place]: 3382 Representative spoke with at [place]: [place] ([place]) Interventions    Readmission Risk Interventions     No data to display

## 2022-02-22 NOTE — Progress Notes (Signed)
Progress Note  15 Days Post-Op  Subjective: Pt tolerating diet and having ostomy output. Reports he ate at least 50% of meals yesterday.   Objective: Vital signs in last 24 hours: Temp:  [98.6 F (37 C)-100.1 F (37.8 C)] 100.1 F (37.8 C) (12/08 0834) Pulse Rate:  [97-114] 97 (12/08 0600) Resp:  [15-27] 19 (12/08 0600) BP: (110-131)/(49-67) 120/64 (12/08 0600) SpO2:  [94 %-100 %] 96 % (12/08 0600) Weight:  [122.5 kg] 122.5 kg (12/08 0500) Last BM Date : 02/21/22  Intake/Output from previous day: 12/07 0701 - 12/08 0700 In: 8337 [P.O.:240; I.V.:7491.9; IV Piggyback:605.1] Out: 2800 [Urine:1200; JKDTO:6712] Intake/Output this shift: No intake/output data recorded.  PE: General: pleasant, WD, obese male who is laying in bed in NAD Heart: regular, rate, and rhythm.   Lungs:Respiratory effort nonlabored Abd: soft, appropriately ttp, moderately distended, midline wound with healthy with beefy red granulation tissue, ostomy with some ulcerations but mostly viable with small amount liquid stool in bag   Lab Results:  Recent Labs    02/21/22 0445 02/22/22 0459  WBC 4.5 4.7  HGB 7.4* 7.1*  HCT 23.6* 22.6*  PLT 268 260   BMET Recent Labs    02/21/22 1738 02/22/22 0459  NA 135 132*  K 4.1 4.1  CL 103 99  CO2 28 28  GLUCOSE 179* 162*  BUN 10 9  CREATININE <0.30* 0.33*  CALCIUM 7.3* 7.1*   PT/INR No results for input(s): "LABPROT", "INR" in the last 72 hours. CMP     Component Value Date/Time   NA 132 (L) 02/22/2022 0459   K 4.1 02/22/2022 0459   CL 99 02/22/2022 0459   CO2 28 02/22/2022 0459   GLUCOSE 162 (H) 02/22/2022 0459   BUN 9 02/22/2022 0459   CREATININE 0.33 (L) 02/22/2022 0459   CREATININE 0.63 (L) 03/10/2019 1436   CALCIUM 7.1 (L) 02/22/2022 0459   PROT 5.3 (L) 02/22/2022 0459   ALBUMIN <1.5 (L) 02/22/2022 0459   AST 24 02/22/2022 0459   ALT 15 02/22/2022 0459   ALKPHOS 93 02/22/2022 0459   BILITOT 0.2 (L) 02/22/2022 0459   GFRNONAA >60  02/22/2022 0459   Lipase  No results found for: "LIPASE"     Studies/Results: No results found.  Anti-infectives: Anti-infectives (From admission, onward)    Start     Dose/Rate Route Frequency Ordered Stop   02/15/22 1400  meropenem (MERREM) 1 g in sodium chloride 0.9 % 100 mL IVPB  Status:  Discontinued        1 g 200 mL/hr over 30 Minutes Intravenous Every 8 hours 02/15/22 1244 02/19/22 1709   02/15/22 1400  vancomycin (VANCOCIN) IVPB 1000 mg/200 mL premix  Status:  Discontinued        1,000 mg 200 mL/hr over 60 Minutes Intravenous Every 12 hours 02/15/22 1304 02/18/22 1445   02/08/22 1200  piperacillin-tazobactam (ZOSYN) IVPB 3.375 g  Status:  Discontinued        3.375 g 12.5 mL/hr over 240 Minutes Intravenous Every 8 hours 02/08/22 0505 02/15/22 1243   02/08/22 0500  piperacillin-tazobactam (ZOSYN) IVPB 3.375 g  Status:  Discontinued        3.375 g 12.5 mL/hr over 240 Minutes Intravenous Every 8 hours 02/08/22 0006 02/08/22 0505   02/08/22 0334  piperacillin-tazobactam (ZOSYN) 3.375 GM/50ML IVPB       Note to Pharmacy: Enis Gash W: cabinet override      02/08/22 0334 02/08/22 0345   02/07/22 2030  ceFEPIme (MAXIPIME) 2  g in sodium chloride 0.9 % 100 mL IVPB        2 g 200 mL/hr over 30 Minutes Intravenous  Once 02/07/22 2023 02/07/22 2248   02/07/22 2030  metroNIDAZOLE (FLAGYL) IVPB 500 mg        500 mg 100 mL/hr over 60 Minutes Intravenous  Once 02/07/22 2023 02/08/22 0506   02/07/22 2030  vancomycin (VANCOCIN) IVPB 1000 mg/200 mL premix        1,000 mg 200 mL/hr over 60 Minutes Intravenous  Once 02/07/22 2023 02/08/22 0506        Assessment/Plan  Sigmoid perforation  S/P exploratory laparotomy, colon resection, end colostomy 02/07/22 Dr. [name] - Continue diet, will order calorie count to get a better idea of PO intake, possibly wean TPN over the weekend - CT 12/1 with some free fluid but no organized collection, CT 12/5 with no major changes - GI  following as well for IBD, fairly new diagnosis   FEN: reg diet, TPN VTE: LMWH ID: vanc/flagyl/cefepime 11/23; zosyn 11/24>11/29; vanc 12/1>12/4; merrem 12/1> 12/5   - below per TRH -  Post-operative respiratory insufficiency T2DM Physical deconditioning  Acute metabolic encephalopathy, improving  HTN HLD Obesity class I OSA Hypogonadism Moderate protein calorie malnutrition  ADHD/Anxiety   LOS: 14 days      Norm Parcel, [facility] Surgery 02/22/2022, 9:00 AM Please see Amion for pager number during day hours 7:00am-4:30pm

## 2022-02-22 NOTE — Progress Notes (Signed)
PROGRESS NOTE  Richard [name]  DOB: Nov 19, 1955  PCP: [name], MD FGH:[id number]  DOA: 02/07/2022  LOS: 14 days  Hospital Day: 16  Brief narrative: Richard [name] is a 66 y.o. male with PMH significant for DM2, HTN, HLD, obesity, OSA, hypogonadism, peripheral neuropathy.  11/7, seen in the office by Dr. [name] with complaint of diarrhea for about 3 to 4 months, 6-8 episodes a day, mixed with blood associated with abdominal cramping and progressive weakness.  Stool studies at that time were negative for infectious etiology.  Sed rate was elevated 72, fecal calprotectin was elevated to 3570. 11/20, underwent colonoscopy, found to have diffuse colitis with deep ulcerations throughout the entire colon friable mucosa as well as extensive mucosal edema in the rectum.  He was started on prednisone 40 mg daily. After colonoscopy, patient developed worsening abdominal pain. 11/23, at home patient had 1 episode of syncope.  Also had a fever.  Abdominal pain worsened and hence came to the ED. In the ED he had fever of 102.1 with tachycardia. CT abdomen pelvis showed perforated bowel with moderate free fluid and free air.  Started on empiric IV antibiotics, IV fluid, and was admitted to ICU.  General surgery was consulted 11/24, patient underwent exploratory laparotomy, descending and sigmoid colon resection, transverse colostomy by Dr. [name]. Surgical pathology showed transmural inflammation consistent with IBD. His postop course was complicated by almost 2 weeks of postop ileus which gradually resolved.  In the 2 weeks duration, he had significant fluid and electrolyte imbalance, hypoxia, fever, encephalopathy and physical deconditioning.  He was kept on TPN.  02/22/2022: The patient was seen and examined at his bedside.  Denies having any pain at the time of this visit.  He did not have any complaints.  Tolerated a consistent solid diet  Assessment and plan: Sepsis POA  Acute  peritonitis d/t perforated sigmoid colon S/p exploratory laparotomy, descending and sigmoid colon resection, transverse colostomy - 11/24 Postop ileus Resolving postop ileus.  NG tube out on 12/5.  TPN continued.  Continue to monitor ostomy output Because of breakthrough fever, he was started on broad-spectrum IV antibiotic coverage with IV meropenem and IV vancomycin.  Completed 5-day course.  No growth on culture. No fever, WBC count normal. As needed pain meds  Inflammatory bowel disease GI following.   Surgical pathology report showed transmural inflammation consistent with IBD.   Noted a plan from GI to start on Remicade in 4 to 6 weeks  Postop respiratory insufficiency Probably due to atelectasis from immobility, postop abdominal distention, third spacing of fluid Currently on supplemental oxygen at 3 to 4 L/min.  Wean down as tolerated Incentive spirometer.  Hypokalemia/hyperkalemia  Hypomagnesemia/hypophosphatemia Acute metabolic alkalosis  His postop course was complicated by significant fluid endplate abnormalities including severe metabolic alkalosis of 7.6 due to high output gastric secretion.  Nephrology was consulted.  Acid-base balance has eventually improved Electrolytes being monitored. Labs from this morning with low potassium, magnesium and phosphorus.  Discussed with pharmacy.  Replacement given.  Continue to monitor.  Moderate Protein Calorie Malnutrition Significant hypoalbuminemia Albumin level significantly low <1.5 Patient has 2+ pedal edema as well as evidence of mesenteric edema and CT scan. Continue TPN.  Encourage oral nutrition Eventually may benefit from Lasix.  But currently will avoid it because of effective arterial hypovolemia.  Type 2 diabetes mellitus with hyperglycemia A1c 6.6 on 01/23/2022 PTA on metformin, Jardiance, hold off home oral hypoglycemics Currently on sliding scale insulin with Accu-Cheks every  4 hours.  Continue to monitor.    Recent Labs  Lab 02/21/22 2329 02/22/22 0417 02/22/22 0821 02/22/22 1209 02/22/22 1600  GLUCAP 154* 160* 152* 149* 175*   Resolved acute metabolic encephalopathy Mentation back to baseline.  Impaired mobility Significant weakness.  Multifactorial: Low-level electrolytes, no oral intake, dehydration, physical deconditioning PT OT with fall precautions.  H/o Essential HTN lisinopril remains on hold Closely monitor vital signs.   HLD Lipitor remains on hold  Obesity -Body mass index is 39.88 kg/m. Patient has been advised to make an attempt to improve diet and exercise patterns to aid in weight loss.  OSA Baseline CPAP 10 cm H20, followed by Dr. Ander Slade CPAP QSH   NGT may impede mask seal    Hx of ADHD, Anxiety methylphenidate on hold   Hx of Hypogonadism. Was on outpatient testosterone       Goals of care   Code Status: Full Code    Mobility: PT follow-up needed.  Infusions:   chlorproMAZINE (THORAZINE) 12.5 mg in sodium chloride 0.9 % 25 mL IVPB Stopped (02/14/22 0913)   sodium phosphate 15 mmol in sodium chloride 0.9 % 250 mL infusion 43 mL/hr at 02/22/22 1200   TPN ADULT (ION) 100 mL/hr at 02/22/22 1200   TPN ADULT (ION)      Scheduled Meds:  Chlorhexidine Gluconate Cloth  6 each Topical Daily   enoxaparin (LOVENOX) injection  60 mg Subcutaneous Daily   feeding supplement  237 mL Oral Q24H   insulin aspart  0-20 Units Subcutaneous Q4H   metoCLOPramide (REGLAN) injection  5 mg Intravenous Q6H   multivitamin with minerals  1 tablet Oral Daily   pantoprazole  40 mg Oral Daily   sodium chloride flush  10-40 mL Intracatheter Q12H   thiamine  100 mg Oral Daily    PRN meds: acetaminophen, chlorproMAZINE (THORAZINE) 12.5 mg in sodium chloride 0.9 % 25 mL IVPB, HYDROmorphone (DILAUDID) injection, ondansetron (ZOFRAN) IV, mouth rinse, sodium chloride flush   Skin assessment:     Nutritional status:  Body mass index is 39.88 kg/m.  Nutrition Problem:  Severe Malnutrition Etiology: acute illness (new dx Crohn's colitis; bowel perforation) Signs/Symptoms: moderate fat depletion, moderate muscle depletion, percent weight loss Percent weight loss: 10 % (in 4 months)     Diet:  Diet Order             Diet regular Room service appropriate? Yes; Fluid consistency: Thin  Diet effective now                   DVT prophylaxis:  Place and maintain sequential compression device Start: 02/08/22 0001   Antimicrobials: IV meropenem and IV vancomycin course completed Fluid: TPN per general surgery.   Consultants: General surgery, GI Family Communication: Wife at bedside  Status is: Inpatient  Continue in-hospital care because: Gradually improving postop ileus. Level of care: ICU   Dispo: The patient is from: Home              Anticipated d/c is to: Pending clinical course              Patient currently is not medically stable to d/c.   Difficult to place patient No       Antimicrobials: Anti-infectives (From admission, onward)    Start     Dose/Rate Route Frequency Ordered Stop   02/15/22 1400  meropenem (MERREM) 1 g in sodium chloride 0.9 % 100 mL IVPB  Status:  Discontinued  1 g 200 mL/hr over 30 Minutes Intravenous Every 8 hours 02/15/22 1244 02/19/22 1709   02/15/22 1400  vancomycin (VANCOCIN) IVPB 1000 mg/200 mL premix  Status:  Discontinued        1,000 mg 200 mL/hr over 60 Minutes Intravenous Every 12 hours 02/15/22 1304 02/18/22 1445   02/08/22 1200  piperacillin-tazobactam (ZOSYN) IVPB 3.375 g  Status:  Discontinued        3.375 g 12.5 mL/hr over 240 Minutes Intravenous Every 8 hours 02/08/22 0505 02/15/22 1243   02/08/22 0500  piperacillin-tazobactam (ZOSYN) IVPB 3.375 g  Status:  Discontinued        3.375 g 12.5 mL/hr over 240 Minutes Intravenous Every 8 hours 02/08/22 0006 02/08/22 0505   02/08/22 0334  piperacillin-tazobactam (ZOSYN) 3.375 GM/50ML IVPB       Note to Pharmacy: Enis Gash W:  cabinet override      02/08/22 0334 02/08/22 0345   02/07/22 2030  ceFEPIme (MAXIPIME) 2 g in sodium chloride 0.9 % 100 mL IVPB        2 g 200 mL/hr over 30 Minutes Intravenous  Once 02/07/22 2023 02/07/22 2248   02/07/22 2030  metroNIDAZOLE (FLAGYL) IVPB 500 mg        500 mg 100 mL/hr over 60 Minutes Intravenous  Once 02/07/22 2023 02/08/22 0506   02/07/22 2030  vancomycin (VANCOCIN) IVPB 1000 mg/200 mL premix        1,000 mg 200 mL/hr over 60 Minutes Intravenous  Once 02/07/22 2023 02/08/22 0506       Objective: Vitals:   02/22/22 1400 02/22/22 1547  BP: (!) 134/59   Pulse: (!) 123   Resp: (!) 25   Temp:  100.2 F (37.9 C)  SpO2: 98%     Intake/Output Summary (Last 24 hours) at 02/22/2022 1606 Last data filed at 02/22/2022 1552 Gross per 24 hour  Intake 3965.32 ml  Output 3550 ml  Net 415.32 ml   Filed Weights   02/20/22 0400 02/21/22 0500 02/22/22 0500  Weight: 118.3 kg 118 kg 122.5 kg   Weight change: 4.5 kg Body mass index is 39.88 kg/m.   Physical Exam: General exam: Pleasant well-developed well-nourished in no acute distress.  Alert and interactive. Skin: No rashes, lesions or ulcers. HEENT: Atraumatic, normocephalic, no obvious bleeding.  NG tube remains clamped. Lungs: Clear to auscultation with no wheezes or rales.   CVS: Regular rate and rhythm with no rubs or gallops. GI/Abd soft, mild postop appropriate tenderness.  Ostomy bag with liquid output.  Bowel sounds, hypoactive. CNS: Eyes open, slow to answer questions.  Oriented to place. Psychiatry: Mood appropriate Extremities: 2+ pitting edema in lower extremities bilaterally.  Data Review: I have personally reviewed the laboratory data and studies available.  F/u labs ordered Unresulted Labs (From admission, onward)     Start     Ordered   02/23/22 2703  Basic metabolic panel  Tomorrow morning,   R       Question:  Specimen collection method  Answer:  Unit=Unit collect   02/22/22 0817   02/21/22  0500  Comprehensive metabolic panel  (TPN Lab Panel)  Every Mon,Thu (0500),   R     Question:  Specimen collection method  Answer:  Unit=Unit collect   02/18/22 1037   02/21/22 0500  Magnesium  (TPN Lab Panel)  Every Mon,Thu (0500),   R     Question:  Specimen collection method  Answer:  Unit=Unit collect   02/18/22 1037  02/21/22 0500  Phosphorus  (TPN Lab Panel)  Every Mon,Thu (0500),   R     Question:  Specimen collection method  Answer:  Unit=Unit collect   02/18/22 1037   02/14/22 0500  Triglycerides  (TPN Lab Panel)  Every Mon,Thu (0500),   R     Question:  Specimen collection method  Answer:  Lab=Lab collect   02/12/22 1801   02/13/22 0500  CBC with Differential/Platelet  Daily,   R     Question:  Specimen collection method  Answer:  Lab=Lab collect   02/12/22 0927   02/13/22 0500  Magnesium  Daily,   R     Question:  Specimen collection method  Answer:  Lab=Lab collect   02/12/22 0927   02/13/22 0500  Phosphorus  Daily,   R     Question:  Specimen collection method  Answer:  Lab=Lab collect   02/12/22 0927   02/11/22 1800  Acid Fast Culture with reflexed sensitivities  (AFB smear + Culture w reflexed sensitivities panel)  3 times daily,   R (with TIMED occurrences),   Status:  Canceled     See Hyperspace for full Linked Orders Report.   02/11/22 1544            Signed, Kayleen Memos, MD [facility] 02/22/2022

## 2022-02-22 NOTE — Progress Notes (Signed)
Pt refused cpap QHS again tonight. Pt said he is fine without it. He said he did not wear it last night and did well. Encouraged to let RN know if he changes his mind.

## 2022-02-22 NOTE — Progress Notes (Addendum)
Daily Progress Note  Hospital Day: 16  Chief Complaint:   Assessment   Patient profile:  Richard [name] is a 66 y.o. male with a pmh of DM, HTN, HLD, OSA, hypogonadism, ADD, IBD ( recent dx) . Admitted with bowel perforation.     #66 yo male with newly diagnosed IBD ( non-specific IBD with active chronic and no granulomas on biopsies. Admitted with bowel bowel perforation s/p exploratory lap, sigmoid resection and transverse colostomy on 11/23. Hospital course complicated by post-op ileus and malnutrition He is improving. Good ostomy output. Calorie count in progress, remaining on TNA for now.     # Macrocytic anemia.  No active bleeding. Hgb with slow dwindle ( 8.2 >7.9 > 7.5 >7.4 > 7.1) Elevated B12 level   # Persistent tachycardia , improving  Plan:   Continue IV Reglan 5 mg Q 6 hours Plan for now is to start Remicade as outpatient ---------------------------------------------------------------------------------------------------------  GI attending:  I have seen and evaluated this patient as well.  He is improved.  Still recovering.  Stool output still significant but less so over the last few days.  Suspect recovery from ileus question with component of Crohn's.  Reglan may be contributing.  Will continue that IV consider switching to p.o. soon.  He should not need that long-term.  We will follow.   Richard Mayer, MD, [facility] See Richard [name] on call - gastroenterology for best contact person 02/22/2022 5:55 PM   Subjective   Working with OT right now. Tolerating more solids now. Feels a little full from ensure but no N/V or abdominal pain. Calorie count in progress.   Objective   Imaging:  CT ABDOMEN PELVIS W CONTRAST  Result Date: 02/19/2022 CLINICAL DATA:  Postoperative abdominal pain. EXAM: CT ABDOMEN AND PELVIS WITH CONTRAST TECHNIQUE: Multidetector CT imaging of the abdomen and pelvis was performed using the standard protocol following  bolus administration of intravenous contrast. RADIATION DOSE REDUCTION: This exam was performed according to the departmental dose-optimization program which includes automated exposure control, adjustment of the mA and/or kV according to patient size and/or use of iterative reconstruction technique. CONTRAST:  133m OMNIPAQUE IOHEXOL 300 MG/ML  SOLN COMPARISON:  February 15, 2022. FINDINGS: Lower chest: Small bilateral pleural effusions are noted with probable bibasilar subsegmental atelectasis, right greater than left. Hepatobiliary: No cholelithiasis or biliary dilatation is noted. No acute abnormality seen in the liver. Pancreas: Unremarkable. No pancreatic ductal dilatation or surrounding inflammatory changes. Spleen: Normal in size without focal abnormality. Adrenals/Urinary Tract: Adrenal glands are unremarkable. Kidneys are normal, without renal calculi, focal lesion, or hydronephrosis. Bladder is unremarkable. Stomach/Bowel: Nasogastric tube tip is seen in proximal stomach. Status post colectomy with colostomy seen in left lower quadrant. Continued presence moderately dilated small bowel loops most consistent with ileus or less likely obstruction. Wall thickening is seen involving the distal duodenum and proximal jejunum suggesting enteritis. Vascular/Lymphatic: Aortic atherosclerosis. No enlarged abdominal or pelvic lymph nodes. Reproductive: Prostate is unremarkable. Other: Mild amount of free fluid is noted throughout the abdomen which most likely is postoperative in etiology. No definite hernia is noted. Musculoskeletal: No acute or significant osseous findings. IMPRESSION: Status post colectomy with colostomy seen in left lower quadrant. Continued presence of moderately dilated small bowel most consistent with ileus or less likely distal small bowel obstruction. Wall and fold thickening of distal duodenum and proximal jejunum is noted suggesting possible edema or enteritis. Mild amount of free fluid is  noted throughout the abdomen  which most likely is postoperative in etiology. Small bilateral pleural effusions are noted with associated bibasilar subsegmental atelectasis, right greater than left. Electronically Signed   By: Marijo Conception M.D.   On: 02/19/2022 15:45   DG Abd Portable 1V  Result Date: 02/19/2022 CLINICAL DATA:  Ileus. EXAM: PORTABLE ABDOMEN - 1 VIEW COMPARISON:  02/17/2022 FINDINGS: Nasogastric tube unchanged as it is coiled once over the stomach with tip over the gastric fundus in the left upper quadrant. Air is present throughout the colon. There are multiple air-filled dilated small bowel loops measuring up to 4.4 cm over the right lower quadrant. No evidence of free peritoneal air. Degenerative changes of the spine and hips. IMPRESSION: Multiple air-filled dilated small bowel loops measuring up to 4.4 cm over the right lower quadrant. Findings may be due to ileus versus early/partial small bowel obstruction. Electronically Signed   By: Marin Olp M.D.   On: 02/19/2022 08:13    Lab Results: Recent Labs    02/20/22 0455 02/21/22 0445 02/22/22 0459  WBC 5.0 4.5 4.7  HGB 7.5* 7.4* 7.1*  HCT 23.9* 23.6* 22.6*  PLT 257 268 260   BMET Recent Labs    02/21/22 0445 02/21/22 1738 02/22/22 0459  NA 141 135 132*  K 2.8* 4.1 4.1  CL 113* 103 99  CO2 '23 28 28  '$ GLUCOSE 138* 179* 162*  BUN '9 10 9  '$ CREATININE <0.30* <0.30* 0.33*  CALCIUM 5.9* 7.3* 7.1*   LFT Recent Labs    02/22/22 0459  PROT 5.3*  ALBUMIN <1.5*  AST 24  ALT 15  ALKPHOS 93  BILITOT 0.2*     Scheduled inpatient medications:   Chlorhexidine Gluconate Cloth  6 each Topical Daily   enoxaparin (LOVENOX) injection  60 mg Subcutaneous Daily   insulin aspart  0-20 Units Subcutaneous Q4H   metoCLOPramide (REGLAN) injection  5 mg Intravenous Q6H   multivitamin with minerals  1 tablet Oral Daily   pantoprazole  40 mg Oral Daily   sodium chloride flush  10-40 mL Intracatheter Q12H   thiamine  100 mg  Oral Daily   Continuous inpatient infusions:   chlorproMAZINE (THORAZINE) 12.5 mg in sodium chloride 0.9 % 25 mL IVPB Stopped (02/14/22 0913)   magnesium sulfate bolus IVPB 2 g (02/22/22 0846)   sodium phosphate 15 mmol in sodium chloride 0.9 % 250 mL infusion     TPN ADULT (ION) 100 mL/hr at 02/22/22 0700   TPN ADULT (ION)     PRN inpatient medications: acetaminophen, chlorproMAZINE (THORAZINE) 12.5 mg in sodium chloride 0.9 % 25 mL IVPB, HYDROmorphone (DILAUDID) injection, ondansetron (ZOFRAN) IV, mouth rinse, sodium chloride flush  Vital signs in last 24 hours: Temp:  [98.6 F (37 C)-100.1 F (37.8 C)] 100.1 F (37.8 C) (12/08 0834) Pulse Rate:  [97-114] 97 (12/08 0600) Resp:  [15-27] 19 (12/08 0600) BP: (110-131)/(49-67) 120/64 (12/08 0600) SpO2:  [94 %-100 %] 96 % (12/08 0600) Weight:  [122.5 kg] 122.5 kg (12/08 0500) Last BM Date : 02/21/22  Intake/Output Summary (Last 24 hours) at 02/22/2022 0934 Last data filed at 02/22/2022 0700 Gross per 24 hour  Intake 8096.95 ml  Output 2100 ml  Net 5996.95 ml    Intake/Output from previous day: 12/07 0701 - 12/08 0700 In: 8337 [P.O.:240; I.V.:7491.9; IV Piggyback:605.1] Out: 2800 [Urine:1200; GEXBM:8413] Intake/Output this shift: No intake/output data recorded.   Physical Exam:  General: Alert male in NAD Heart:  Regular rate and rhythm.  Pulmonary: Normal respiratory effort  Abdomen: Soft, distended, nontender. Normal bowel sounds. Greenish brown liquid stool in ostomy Neurologic: Alert and oriented Psych: Pleasant. Cooperative.    Principal Problem:   Perforated abdominal viscus Active Problems:   Protein-calorie malnutrition, severe   Ileus, postoperative ([place])   Crohn's disease of colon with complication ([place])    LOS: 14 days   Tye Savoy ,NP 02/22/2022, 9:34 AM

## 2022-02-23 ENCOUNTER — Encounter (HOSPITAL_COMMUNITY): Payer: Self-pay | Admitting: Pulmonary Disease

## 2022-02-23 DIAGNOSIS — Z794 Long term (current) use of insulin: Secondary | ICD-10-CM | POA: Diagnosis present

## 2022-02-23 DIAGNOSIS — K9189 Other postprocedural complications and disorders of digestive system: Secondary | ICD-10-CM | POA: Diagnosis not present

## 2022-02-23 DIAGNOSIS — D62 Acute posthemorrhagic anemia: Secondary | ICD-10-CM | POA: Diagnosis not present

## 2022-02-23 DIAGNOSIS — K631 Perforation of intestine (nontraumatic): Secondary | ICD-10-CM | POA: Diagnosis not present

## 2022-02-23 DIAGNOSIS — E1165 Type 2 diabetes mellitus with hyperglycemia: Secondary | ICD-10-CM | POA: Diagnosis present

## 2022-02-23 DIAGNOSIS — I10 Essential (primary) hypertension: Secondary | ICD-10-CM | POA: Diagnosis present

## 2022-02-23 DIAGNOSIS — F419 Anxiety disorder, unspecified: Secondary | ICD-10-CM | POA: Diagnosis present

## 2022-02-23 DIAGNOSIS — K50119 Crohn's disease of large intestine with unspecified complications: Secondary | ICD-10-CM

## 2022-02-23 DIAGNOSIS — E1129 Type 2 diabetes mellitus with other diabetic kidney complication: Secondary | ICD-10-CM | POA: Diagnosis present

## 2022-02-23 DIAGNOSIS — K635 Polyp of colon: Secondary | ICD-10-CM | POA: Diagnosis present

## 2022-02-23 DIAGNOSIS — Z933 Colostomy status: Secondary | ICD-10-CM

## 2022-02-23 LAB — CBC WITH DIFFERENTIAL/PLATELET
Abs Immature Granulocytes: 0.05 10*3/uL (ref 0.00–0.07)
Basophils Absolute: 0 10*3/uL (ref 0.0–0.1)
Basophils Relative: 0 %
Eosinophils Absolute: 0.2 10*3/uL (ref 0.0–0.5)
Eosinophils Relative: 4 %
HCT: 22.7 % — ABNORMAL LOW (ref 39.0–52.0)
Hemoglobin: 7 g/dL — ABNORMAL LOW (ref 13.0–17.0)
Immature Granulocytes: 1 %
Lymphocytes Relative: 10 %
Lymphs Abs: 0.5 10*3/uL — ABNORMAL LOW (ref 0.7–4.0)
MCH: 32.6 pg (ref 26.0–34.0)
MCHC: 30.8 g/dL (ref 30.0–36.0)
MCV: 105.6 fL — ABNORMAL HIGH (ref 80.0–100.0)
Monocytes Absolute: 0.4 10*3/uL (ref 0.1–1.0)
Monocytes Relative: 8 %
Neutro Abs: 3.8 10*3/uL (ref 1.7–7.7)
Neutrophils Relative %: 77 %
Platelets: 265 10*3/uL (ref 150–400)
RBC: 2.15 MIL/uL — ABNORMAL LOW (ref 4.22–5.81)
RDW: 14.4 % (ref 11.5–15.5)
WBC: 5 10*3/uL (ref 4.0–10.5)
nRBC: 0 % (ref 0.0–0.2)

## 2022-02-23 LAB — GLUCOSE, CAPILLARY
Glucose-Capillary: 132 mg/dL — ABNORMAL HIGH (ref 70–99)
Glucose-Capillary: 136 mg/dL — ABNORMAL HIGH (ref 70–99)
Glucose-Capillary: 153 mg/dL — ABNORMAL HIGH (ref 70–99)
Glucose-Capillary: 153 mg/dL — ABNORMAL HIGH (ref 70–99)
Glucose-Capillary: 168 mg/dL — ABNORMAL HIGH (ref 70–99)
Glucose-Capillary: 186 mg/dL — ABNORMAL HIGH (ref 70–99)

## 2022-02-23 LAB — BASIC METABOLIC PANEL
Anion gap: 3 — ABNORMAL LOW (ref 5–15)
BUN: 11 mg/dL (ref 8–23)
CO2: 32 mmol/L (ref 22–32)
Calcium: 7.7 mg/dL — ABNORMAL LOW (ref 8.9–10.3)
Chloride: 102 mmol/L (ref 98–111)
Creatinine, Ser: 0.36 mg/dL — ABNORMAL LOW (ref 0.61–1.24)
GFR, Estimated: >60 mL/min (ref >60)
Glucose, Bld: 142 mg/dL — ABNORMAL HIGH (ref 70–99)
Potassium: 4.1 mmol/L (ref 3.5–5.1)
Sodium: 137 mmol/L (ref 135–145)

## 2022-02-23 LAB — PHOSPHORUS: Phosphorus: 2.9 mg/dL (ref 2.5–4.6)

## 2022-02-23 LAB — PREPARE RBC (CROSSMATCH)

## 2022-02-23 LAB — MAGNESIUM: Magnesium: 1.8 mg/dL (ref 1.7–2.4)

## 2022-02-23 MED ORDER — MAGNESIUM SULFATE 2 GM/50ML IV SOLN
2.0000 g | Freq: Once | INTRAVENOUS | Status: AC
  Administered 2022-02-23: 2 g via INTRAVENOUS
  Filled 2022-02-23: qty 50

## 2022-02-23 MED ORDER — SODIUM CHLORIDE 0.9 % IV SOLN
125.0000 mg | Freq: Once | INTRAVENOUS | Status: AC
  Administered 2022-02-23: 125 mg via INTRAVENOUS
  Filled 2022-02-23: qty 10

## 2022-02-23 MED ORDER — CALCIUM POLYCARBOPHIL 625 MG PO TABS
625.0000 mg | ORAL_TABLET | Freq: Two times a day (BID) | ORAL | Status: DC
  Administered 2022-02-23 – 2022-03-01 (×12): 625 mg via ORAL
  Filled 2022-02-23 (×13): qty 1

## 2022-02-23 MED ORDER — SALINE SPRAY 0.65 % NA SOLN: 1.0000 | Freq: Four times a day (QID) | NASAL | Status: DC | PRN

## 2022-02-23 MED ORDER — VITAMIN C 500 MG PO TABS: 500.0000 mg | ORAL_TABLET | Freq: Two times a day (BID) | ORAL | Status: DC

## 2022-02-23 MED ORDER — VITAMIN C 500 MG PO TABS
500.0000 mg | ORAL_TABLET | Freq: Two times a day (BID) | ORAL | Status: DC
  Administered 2022-02-23 – 2022-03-01 (×12): 500 mg via ORAL
  Filled 2022-02-23 (×13): qty 1

## 2022-02-23 MED ORDER — INSULIN ASPART 100 UNIT/ML IJ SOLN
0.0000 [IU] | Freq: Three times a day (TID) | INTRAMUSCULAR | Status: DC
  Administered 2022-02-23 – 2022-02-24 (×5): 3 [IU] via SUBCUTANEOUS
  Administered 2022-02-24: 2 [IU] via SUBCUTANEOUS
  Administered 2022-02-25: 3 [IU] via SUBCUTANEOUS
  Administered 2022-02-25 – 2022-02-26 (×3): 2 [IU] via SUBCUTANEOUS
  Administered 2022-02-27 (×3): 3 [IU] via SUBCUTANEOUS
  Administered 2022-02-28 (×2): 5 [IU] via SUBCUTANEOUS
  Administered 2022-02-28: 11 [IU] via SUBCUTANEOUS
  Administered 2022-03-01: 5 [IU] via SUBCUTANEOUS
  Administered 2022-03-01: 8 [IU] via SUBCUTANEOUS

## 2022-02-23 MED ORDER — ALUM & MAG HYDROXIDE-SIMETH 200-200-20 MG/5ML PO SUSP: 30.0000 mL | Freq: Four times a day (QID) | ORAL | Status: DC | PRN

## 2022-02-23 MED ORDER — INSULIN ASPART 100 UNIT/ML IJ SOLN
0.0000 [IU] | Freq: Every day | INTRAMUSCULAR | Status: DC
  Administered 2022-02-27: 2 [IU] via SUBCUTANEOUS
  Administered 2022-02-28: 4 [IU] via SUBCUTANEOUS

## 2022-02-23 MED ORDER — ACETAMINOPHEN 500 MG PO TABS
1000.0000 mg | ORAL_TABLET | Freq: Four times a day (QID) | ORAL | Status: DC
  Administered 2022-02-23 – 2022-02-25 (×8): 1000 mg via ORAL
  Filled 2022-02-23 (×8): qty 2

## 2022-02-23 MED ORDER — NAPHAZOLINE-GLYCERIN 0.012-0.25 % OP SOLN: 1.0000 [drp] | Freq: Four times a day (QID) | OPHTHALMIC | Status: DC | PRN

## 2022-02-23 MED ORDER — MENTHOL 3 MG MT LOZG: 1.0000 | LOZENGE | OROMUCOSAL | Status: DC | PRN

## 2022-02-23 MED ORDER — PHENOL 1.4 % MT LIQD: 2.0000 | OROMUCOSAL | Status: DC | PRN

## 2022-02-23 MED ORDER — METOCLOPRAMIDE HCL 5 MG PO TABS
5.0000 mg | ORAL_TABLET | Freq: Three times a day (TID) | ORAL | Status: DC
  Administered 2022-02-23 – 2022-02-24 (×3): 5 mg via ORAL
  Filled 2022-02-23 (×4): qty 1

## 2022-02-23 MED ORDER — MAGIC MOUTHWASH: 15.0000 mL | Freq: Four times a day (QID) | ORAL | Status: DC | PRN

## 2022-02-23 MED ORDER — SODIUM CHLORIDE 0.9% IV SOLUTION: Freq: Once | INTRAVENOUS | Status: AC

## 2022-02-23 MED ORDER — LIP MEDEX EX OINT
TOPICAL_OINTMENT | Freq: Two times a day (BID) | CUTANEOUS | Status: DC
  Administered 2022-02-23 – 2022-02-27 (×5): 75 via TOPICAL
  Filled 2022-02-23 (×2): qty 7

## 2022-02-23 MED ORDER — ATORVASTATIN CALCIUM 40 MG PO TABS
40.0000 mg | ORAL_TABLET | Freq: Every day | ORAL | Status: DC
  Administered 2022-02-23 – 2022-03-01 (×7): 40 mg via ORAL
  Filled 2022-02-23 (×7): qty 1

## 2022-02-23 MED ORDER — SIMETHICONE 40 MG/0.6ML PO SUSP: 80.0000 mg | Freq: Four times a day (QID) | ORAL | Status: DC | PRN

## 2022-02-23 NOTE — Progress Notes (Signed)
Pt kept refusing cpap QHS. He said he is fine only on O2. RT encouraged if he change his mind let the RN know. Pt understood.

## 2022-02-23 NOTE — Progress Notes (Signed)
Inpatient Rehab Admissions Coordinator:    I spoke with Pt. And wife regarding potential CIR admit. They state interest and that Pt.'s wife would be home with him 24/7 following d/c. I will open case for insurance auth on Monday and pursue for admit.   Clemens Catholic, [place], [place] Admissions Coordinator  [phone] ([place]) [phone] (office)

## 2022-02-23 NOTE — Progress Notes (Addendum)
PROGRESS NOTE  Richard [name]  DOB: April 01, 1955  PCP: [name], MD IWL:[id number]  DOA: 02/07/2022  LOS: 15 days  Hospital Day: 17  Brief narrative: Richard [name] is a 66 y.o. male with PMH significant for DM2, HTN, HLD, obesity, OSA, hypogonadism, peripheral neuropathy.  11/7, seen in the office by Dr. [name] with complaint of diarrhea for about 3 to 4 months, 6-8 episodes a day, mixed with blood associated with abdominal cramping and progressive weakness.  Stool studies at that time were negative for infectious etiology.  Sed rate was elevated 72, fecal calprotectin was elevated to 3570. 11/20, underwent colonoscopy, found to have diffuse colitis with deep ulcerations throughout the entire colon friable mucosa as well as extensive mucosal edema in the rectum.  He was started on prednisone 40 mg daily.  After colonoscopy, patient developed worsening abdominal pain. 11/23, at home patient had 1 episode of syncope.  Also had a fever.  Abdominal pain worsened and hence came to the ED.  In the ED, he had fever of 102.1 with tachycardia. CT abdomen pelvis showed perforated bowel with moderate free fluid and free air.  Started on empiric IV antibiotics, IV fluid, and was admitted to ICU.  General surgery was consulted 11/24, patient underwent exploratory laparotomy, descending and sigmoid colon resection, transverse colostomy by Dr. [name]. Surgical pathology showed transmural inflammation consistent with IBD. His postop course was complicated by almost 2 weeks of postop ileus which gradually resolved.  In the 2 weeks duration, he had significant fluid and electrolyte imbalance, hypoxia, fever, encephalopathy and physical deconditioning.  He received TPN, with plan to discontinue on 02/23/2022 evening, as he is tolerating a solid diet.  02/23/2022: The patient was seen and examined at his bedside.  His wife was present in the room.  Denies having any abdominal pain or chest pain.  No new  complaints.  Dependent edema noted on exam, involving all 4 limbs.  Hemoglobin down trending.  This morning, hemoglobin 7.0.  Patient agreed to transfusing 1 unit PRBCs.  Assessment and plan: Resolved sepsis POA  Acute peritonitis d/t perforated sigmoid colon, POA S/p exploratory laparotomy, descending and sigmoid colon resection, transverse colostomy - 02/08/22 Resolving postop ileus NG tube out on 12/5.  TPN will be discontinued on 02/23/2022 evening.  Continue to monitor ostomy output Because of breakthrough fever, he was started on broad-spectrum IV antibiotic coverage with IV meropenem and IV vancomycin.  Completed 5-day course.  No growth on culture. No fever, WBC count normal. As needed pain meds  Acute blood loss anemia/anemia of chronic disease Post intra-abdominal surgery Hemoglobin 7.0 1 unit PRBCs ordered to be transfused Obtain CBC post blood transfusion Closely monitor H&H  Inflammatory bowel disease Appreciate GIs assistance. Surgical pathology report showed transmural inflammation consistent with IBD.   Noted a plan from GI to start on Remicade in 4 to 6 weeks  Dependent edema Mobilize as tolerated Elevate extremities  Postop respiratory insufficiency Probably due to atelectasis from immobility, postop abdominal distention, third spacing of fluid Currently on supplemental oxygen at 3 to 4 L/min.  Wean down as tolerated Incentive spirometer.  Hypokalemia/hyperkalemia  Hypomagnesemia/hypophosphatemia Acute metabolic alkalosis  His postop course was complicated by significant fluid endplate abnormalities including severe metabolic alkalosis of 7.6 due to high output gastric secretion.  Nephrology was consulted.  Acid-base balance has eventually improved Electrolytes being monitored.  Moderate Protein Calorie Malnutrition Significant hypoalbuminemia Albumin level significantly low <1.5 Patient has 2+ pedal edema as well as evidence  of mesenteric edema on CT  scan. Continue to encourage oral nutrition. Eventually may benefit from Lasix.  But currently will avoid it because of effective arterial hypovolemia.  Type 2 diabetes mellitus with hyperglycemia A1c 6.6 on 01/23/2022 PTA on metformin, Jardiance, hold off home oral hypoglycemics Currently on sliding scale insulin.  Resolved acute metabolic encephalopathy Mentation is back to baseline.  Impaired mobility/generalized weakness Significant weakness.  Multifactorial: Low-level electrolytes, poor oral intake, dehydration, physical deconditioning. PT OT with fall precautions. CIR evaluation for possible admission to CIR.  H/o Essential HTN lisinopril remains on hold Closely monitor vital signs.   HLD Lipitor remains on hold  Obesity -Body mass index is 39.85 kg/m. Patient has been advised to make an attempt to improve diet and exercise patterns to aid in weight loss.  OSA Baseline CPAP 10 cm H20, followed by Dr. Ander Slade CPAP QSH     Hx of ADHD methylphenidate on hold   Hx of Hypogonadism. Was on outpatient testosterone       Goals of care   Code Status: Full Code    Mobility: PT follow-up needed.  Infusions:   chlorproMAZINE (THORAZINE) 12.5 mg in sodium chloride 0.9 % 25 mL IVPB Stopped (02/14/22 0913)   ferric gluconate (FERRLECIT) IVPB 125 mg (02/23/22 1136)   TPN ADULT (ION) 50 mL/hr at 02/23/22 1044    Scheduled Meds:  sodium chloride   Intravenous Once   acetaminophen  1,000 mg Oral Q6H   ascorbic acid  500 mg Oral BID   atorvastatin  40 mg Oral Daily   Chlorhexidine Gluconate Cloth  6 each Topical Daily   enoxaparin (LOVENOX) injection  60 mg Subcutaneous Daily   feeding supplement  237 mL Oral Q24H   insulin aspart  0-15 Units Subcutaneous TID WC   insulin aspart  0-5 Units Subcutaneous QHS   lip balm   Topical BID   metoCLOPramide (REGLAN) injection  5 mg Intravenous Q6H   multivitamin with minerals  1 tablet Oral Daily   pantoprazole  40 mg Oral  Daily   polycarbophil  625 mg Oral BID   sodium chloride flush  10-40 mL Intracatheter Q12H   thiamine  100 mg Oral Daily    PRN meds: alum & mag hydroxide-simeth, chlorproMAZINE (THORAZINE) 12.5 mg in sodium chloride 0.9 % 25 mL IVPB, HYDROmorphone (DILAUDID) injection, magic mouthwash, menthol-cetylpyridinium, naphazoline-glycerin, ondansetron (ZOFRAN) IV, mouth rinse, phenol, simethicone, sodium chloride, sodium chloride flush   Skin assessment:     Nutritional status:  Body mass index is 39.85 kg/m.  Nutrition Problem: Severe Malnutrition Etiology: acute illness (new dx Crohn's colitis; bowel perforation) Signs/Symptoms: moderate fat depletion, moderate muscle depletion, percent weight loss Percent weight loss: 10 % (in 4 months)     Diet:  Diet Order             Diet regular Room service appropriate? Yes; Fluid consistency: Thin  Diet effective now                   DVT prophylaxis:  Place and maintain sequential compression device Start: 02/08/22 0001   Antimicrobials: IV meropenem and IV vancomycin course completed Fluid: TPN per general surgery, will complete on 02/23/2022.   Consultants: General surgery, GI Family Communication: Updated wife at bedside.   Status is: Inpatient  Continue in-hospital care because: Gradually improving postop ileus. Level of care: Stepdown unit  Dispo: The patient is from: Home  Anticipated d/c is to: CIR in the next 48-72 hours.              Patient currently is not medically stable to d/c.   Difficult to place patient No       Antimicrobials: Anti-infectives (From admission, onward)    Start     Dose/Rate Route Frequency Ordered Stop   02/15/22 1400  meropenem (MERREM) 1 g in sodium chloride 0.9 % 100 mL IVPB  Status:  Discontinued        1 g 200 mL/hr over 30 Minutes Intravenous Every 8 hours 02/15/22 1244 02/19/22 1709   02/15/22 1400  vancomycin (VANCOCIN) IVPB 1000 mg/200 mL premix  Status:   Discontinued        1,000 mg 200 mL/hr over 60 Minutes Intravenous Every 12 hours 02/15/22 1304 02/18/22 1445   02/08/22 1200  piperacillin-tazobactam (ZOSYN) IVPB 3.375 g  Status:  Discontinued        3.375 g 12.5 mL/hr over 240 Minutes Intravenous Every 8 hours 02/08/22 0505 02/15/22 1243   02/08/22 0500  piperacillin-tazobactam (ZOSYN) IVPB 3.375 g  Status:  Discontinued        3.375 g 12.5 mL/hr over 240 Minutes Intravenous Every 8 hours 02/08/22 0006 02/08/22 0505   02/08/22 0334  piperacillin-tazobactam (ZOSYN) 3.375 GM/50ML IVPB       Note to Pharmacy: Enis Gash W: cabinet override      02/08/22 0334 02/08/22 0345   02/07/22 2030  ceFEPIme (MAXIPIME) 2 g in sodium chloride 0.9 % 100 mL IVPB        2 g 200 mL/hr over 30 Minutes Intravenous  Once 02/07/22 2023 02/07/22 2248   02/07/22 2030  metroNIDAZOLE (FLAGYL) IVPB 500 mg        500 mg 100 mL/hr over 60 Minutes Intravenous  Once 02/07/22 2023 02/08/22 0506   02/07/22 2030  vancomycin (VANCOCIN) IVPB 1000 mg/200 mL premix        1,000 mg 200 mL/hr over 60 Minutes Intravenous  Once 02/07/22 2023 02/08/22 0506       Objective: Vitals:   02/23/22 0900 02/23/22 1000  BP:  117/63  Pulse:  (!) 116  Resp:  20  Temp: 99 F (37.2 C)   SpO2:  99%    Intake/Output Summary (Last 24 hours) at 02/23/2022 1202 Last data filed at 02/23/2022 1044 Gross per 24 hour  Intake 1361.45 ml  Output 2050 ml  Net -688.55 ml   Filed Weights   02/21/22 0500 02/22/22 0500 02/23/22 0500  Weight: 118 kg 122.5 kg 122.4 kg   Weight change: -0.1 kg Body mass index is 39.85 kg/m.   Physical Exam: General exam: Pleasant well-developed well-nourished no acute distress.  He is alert and oriented x 3.   Skin: No rashes, lesions or ulcers. HEENT: Atraumatic, normocephalic, no obvious bleeding.  NG tube remains clamped. Lungs: Clear to auscultation with no wheezes or rales. CVS: Regular rate and rhythm no rubs or gallops. GI/Abd soft, mild  postop appropriate tenderness.  Ostomy bag with liquid output.  Bowel sounds, hypoactive. CNS: Eyes open, slow to answer questions.  Oriented to place. Psychiatry: Mood is appropriate for condition and setting. Extremities: Dependent edema in all 4 extremities.  Data Review: I have personally reviewed the laboratory data and studies available.  F/u labs ordered Unresulted Labs (From admission, onward)     Start     Ordered   02/13/22 0500  CBC with Differential/Platelet  Daily,   R  Question:  Specimen collection method  Answer:  Lab=Lab collect   02/12/22 0927   02/13/22 0500  Magnesium  Daily,   R     Question:  Specimen collection method  Answer:  Lab=Lab collect   02/12/22 0927   02/13/22 0500  Phosphorus  Daily,   R     Question:  Specimen collection method  Answer:  Lab=Lab collect   02/12/22 0927   02/11/22 1800  Acid Fast Culture with reflexed sensitivities  (AFB smear + Culture w reflexed sensitivities panel)  3 times daily,   R,   Status:  Canceled     See Hyperspace for full Linked Orders Report.   02/11/22 1544            Signed, Kayleen Memos, MD [facility] 02/23/2022

## 2022-02-23 NOTE — Progress Notes (Signed)
[facility] ROUNDING NOTE   Subjective: No new complaints.  Patient continues to show slow improvement.  Tolerating soft diet, denies abdominal pain, nausea/vomiting.  Having profuse liquid ostomy output.  Bothered by persistent cough.   Objective: Vital signs in last 24 hours: Temp:  [97.6 F (36.4 C)-100.2 F (37.9 C)] 99 F (37.2 C) (12/09 0900) Pulse Rate:  [90-123] 123 (12/09 1200) Resp:  [18-31] 20 (12/09 1200) BP: (104-134)/(49-68) 115/64 (12/09 1200) SpO2:  [95 %-100 %] 100 % (12/09 1200) Weight:  [122.4 kg] 122.4 kg (12/09 0500) Last BM Date : 02/21/22 General: NAD Lungs:  CTA b/l, no w/r/r Heart:  RRR, no m/r/g Abdomen:  Soft, NT, distended, bowel sounds active but hollow, tympanic, pink protuberant ostomy with brown liquid stool in bag Ext: Bilateral pitting lower extremity edema    Intake/Output from previous day: 12/08 0701 - 12/09 0700 In: 1991.4 [P.O.:480; I.V.:1206.6; IV Piggyback:304.8] Out: 2350 [Urine:1400; Stool:950] Intake/Output this shift: Total I/O In: 461.3 [P.O.:120; I.V.:251.6; IV Piggyback:89.7] Out: 1000 [Urine:1000]   Lab Results: Recent Labs    02/21/22 0445 02/22/22 0459 02/23/22 0500  WBC 4.5 4.7 5.0  HGB 7.4* 7.1* 7.0*  PLT 268 260 265  MCV 104.9* 105.1* 105.6*   BMET Recent Labs    02/21/22 1738 02/22/22 0459 02/23/22 0500  NA 135 132* 137  K 4.1 4.1 4.1  CL 103 99 102  CO2 28 28 32  GLUCOSE 179* 162* 142*  BUN '10 9 11  '$ CREATININE <0.30* 0.33* 0.36*  CALCIUM 7.3* 7.1* 7.7*   LFT Recent Labs    02/21/22 0445 02/22/22 0459  PROT 4.1* 5.3*  ALBUMIN <1.5* <1.5*  AST 16 24  ALT 10 15  ALKPHOS 54 93  BILITOT 0.3 0.2*   PT/INR No results for input(s): "INR" in the last 72 hours.    Imaging/Other results: No results found.    Assessment and Plan:  66 yo male with newly diagnosed IBD (favor Crohn's colitis).  Admitted with delayed colonic perforation following diagnostic colonoscopy, now s/p  exploratory lap, sigmoid resection and transverse colostomy on 11/23. Hospital course complicated by post-op ileus and malnutrition His ileus seems to be improving, tolerating soft diet with no pain or nausea and with plenty of ostomy output.  Will transition Reglan to p.o. today. Plan to start Remicade as outpatient, timing TBD.  Ileus - Diet advancement and TPN wean per surgery - Transition Reglan to p.o. today  Anemia - Agree with transfusion to maintain hemoglobin greater than 7  Crohn's colitis - Plan for outpatient initiation of Remicade (indeterminant QuantiFERON, but negative AFB x 3), timing to be determined    Daryel November, MD  02/23/2022, 1:05 PM [facility]

## 2022-02-23 NOTE — Progress Notes (Signed)
16 Days Post-Op   Subjective/Chief Complaint  Tolerating diet Ostomy productive  Objective: Vital signs in last 24 hours: Temp:  [97.6 F (36.4 C)-100.2 F (37.9 C)] 97.6 F (36.4 C) (12/09 0415) Pulse Rate:  [90-129] 107 (12/09 0600) Resp:  [18-31] 21 (12/09 0600) BP: (104-134)/(49-68) 112/68 (12/09 0600) SpO2:  [95 %-99 %] 98 % (12/09 0600) Weight:  [122.4 kg] 122.4 kg (12/09 0500) Last BM Date : 02/21/22  Intake/Output from previous day: 12/08 0701 - 12/09 0700 In: 1991.4 [P.O.:480; I.V.:1206.6; IV Piggyback:304.8] Out: 2350 [Urine:1400; Stool:950] Intake/Output this shift: No intake/output data recorded.  General: pleasant, WD, obese male who is laying in bed in NAD Heart: regular, rate, and rhythm.   Lungs:Respiratory effort nonlabored Abd: soft, appropriately ttp, moderately distended,  midline wound with healthy beefy red granulation tissue,  ostomy mostly viable with liquid stool in bag    Lab Results:  Recent Labs    02/22/22 0459 02/23/22 0500  WBC 4.7 5.0  HGB 7.1* 7.0*  HCT 22.6* 22.7*  PLT 260 265   BMET Recent Labs    02/22/22 0459 02/23/22 0500  NA 132* 137  K 4.1 4.1  CL 99 102  CO2 28 32  GLUCOSE 162* 142*  BUN 9 11  CREATININE 0.33* 0.36*  CALCIUM 7.1* 7.7*   PT/INR No results for input(s): "LABPROT", "INR" in the last 72 hours. ABG No results for input(s): "PHART", "HCO3" in the last 72 hours.  Invalid input(s): "PCO2", "PO2"  Studies/Results: No results found.  Anti-infectives: Anti-infectives (From admission, onward)    Start     Dose/Rate Route Frequency Ordered Stop   02/15/22 1400  meropenem (MERREM) 1 g in sodium chloride 0.9 % 100 mL IVPB  Status:  Discontinued        1 g 200 mL/hr over 30 Minutes Intravenous Every 8 hours 02/15/22 1244 02/19/22 1709   02/15/22 1400  vancomycin (VANCOCIN) IVPB 1000 mg/200 mL premix  Status:  Discontinued        1,000 mg 200 mL/hr over 60 Minutes Intravenous Every 12 hours 02/15/22  1304 02/18/22 1445   02/08/22 1200  piperacillin-tazobactam (ZOSYN) IVPB 3.375 g  Status:  Discontinued        3.375 g 12.5 mL/hr over 240 Minutes Intravenous Every 8 hours 02/08/22 0505 02/15/22 1243   02/08/22 0500  piperacillin-tazobactam (ZOSYN) IVPB 3.375 g  Status:  Discontinued        3.375 g 12.5 mL/hr over 240 Minutes Intravenous Every 8 hours 02/08/22 0006 02/08/22 0505   02/08/22 0334  piperacillin-tazobactam (ZOSYN) 3.375 GM/50ML IVPB       Note to Pharmacy: Enis Gash W: cabinet override      02/08/22 0334 02/08/22 0345   02/07/22 2030  ceFEPIme (MAXIPIME) 2 g in sodium chloride 0.9 % 100 mL IVPB        2 g 200 mL/hr over 30 Minutes Intravenous  Once 02/07/22 2023 02/07/22 2248   02/07/22 2030  metroNIDAZOLE (FLAGYL) IVPB 500 mg        500 mg 100 mL/hr over 60 Minutes Intravenous  Once 02/07/22 2023 02/08/22 0506   02/07/22 2030  vancomycin (VANCOCIN) IVPB 1000 mg/200 mL premix        1,000 mg 200 mL/hr over 60 Minutes Intravenous  Once 02/07/22 2023 02/08/22 0506       Assessment/Plan: Sigmoid perforation  S/P exploratory laparotomy, colon resection, end colostomy 02/07/22 Dr. [name] - Continue diet, will order calorie count to get a better idea  of PO intake, wean TPN over the weekend - CT 12/1 with some free fluid but no organized collection, CT 12/5 with no major changes - GI following as well for IBD, fairly new diagnosis   FEN: reg diet, TPN VTE: LMWH ID: vanc/flagyl/cefepime 11/23; zosyn 11/24>11/29; vanc 12/1>12/4; merrem 12/1> 12/5   - below per TRH -  Post-operative respiratory insufficiency T2DM Physical deconditioning  Acute metabolic encephalopathy, improving  HTN HLD Obesity class I OSA Hypogonadism Moderate protein calorie malnutrition  ADHD/Anxiety     LOS: 15 days    Maia Petties 02/23/2022

## 2022-02-23 NOTE — Progress Notes (Signed)
PHARMACY - TOTAL PARENTERAL NUTRITION CONSULT NOTE   Indication: Prolonged ileus  Patient Measurements: Height: '5\' 9"'$  (175.3 cm) Weight: 122.4 kg (269 lb 13.5 oz) IBW/kg (Calculated) : 70.7 TPN AdjBW (KG): 80.3 Body mass index is 39.85 kg/m.  Assessment: 66 yo M s/p colonic perforation status post exploratory lap, partial colectomy and transverse colostomy 02/08/2022.  PMHx Scrotal abscess August 2023, Colon polyp, Diverticulosis, ADHD, Anxiety, Rhinitis, COVID July 2022, HLD, HTN, OSA, PNA, Seborrheic dermatitis, Hypogonadism, DM type 2, Peripheral neuropathy, former smoker.   Pharmacy consulted to manage TPN for prolonged ileus.    Glucose / Insulin: hx DM2 on metformin, Jardiance PTA - Goal CBG < 180. CBGs all < 180 - rSSI used/ 24 hrs: 20 units Electrolytes: Na 137, K 4,1, Phos 2.9, Mag 1.8,  - Goal K > 4, Mag > 2 for ileus Renal: SCr <1, BUN WNL.   Hepatic: LFTs, Tbili WNL, Trig 144 (12/7), Alb <1.5 Intake / Output; I/O +2395 L/ 24 hrs - NG removed 12/5. Stool 950 ml/24 hrs. UOP 1400 mL/24 hrs.  No MVIF GI Imaging:  11/28 CT A/P: post op ileus 12/5 CT:  Continued ileus or less likely distal SBO.  Possible edema or enteritis. Mild amount of free fluid is noted throughout the abdomen which most likely is postoperative  GI Surgeries / Procedures:  02/08/22:  colonic perforation status post exploratory lap, partial colectomy and transverse colostomy   Central access: PICC placed 11/28 for TPN TPN start date: 02/13/22  Nutritional Goals: 12/1 Goal TPN rate is 100 mL/hr to provide 142 g of protein and 2390 kcals per day  12/7 recalculated Goal TPN rate is 120 mL/hr to provide 144 g of protein and 2471 kcals per day  RD Assessment: Estimated Needs Total Energy Estimated Needs: 2300-2600 kcals Total Protein Estimated Needs: 135-155 grams Total Fluid Estimated Needs: >/= 2.3L  Current Nutrition:  Regular diet (start 12/7) and TPN @ 50 ml/hr   Plan:  Now:   Magnesium 2g  bolus x1  At 1800: DC TPN Change SSI to mod ACHS DC TPN labs  Thank you for allowing pharmacy to be a part of this patient's care.  Eudelia Bunch, Pharm.D Use secure chat for questions 02/23/2022 7:01 AM

## 2022-02-23 NOTE — Progress Notes (Signed)
Inpatient Rehab Admissions Coordinator:  ? ?Per therapy recommendations,  patient was screened for CIR candidacy by Lilie Vezina, MS, CCC-SLP. At this time, Pt. Appears to be a a potential candidate for CIR. I will place   order for rehab consult per protocol for full assessment. Please contact me any with questions. ? ?Olivene Cookston, MS, CCC-SLP ?Rehab Admissions Coordinator  ?[phone] (celll) ?[phone] (office) ? ?

## 2022-02-24 DIAGNOSIS — D62 Acute posthemorrhagic anemia: Secondary | ICD-10-CM

## 2022-02-24 DIAGNOSIS — K9189 Other postprocedural complications and disorders of digestive system: Secondary | ICD-10-CM | POA: Diagnosis not present

## 2022-02-24 DIAGNOSIS — K50119 Crohn's disease of large intestine with unspecified complications: Secondary | ICD-10-CM | POA: Diagnosis not present

## 2022-02-24 DIAGNOSIS — K631 Perforation of intestine (nontraumatic): Secondary | ICD-10-CM | POA: Diagnosis not present

## 2022-02-24 DIAGNOSIS — E43 Unspecified severe protein-calorie malnutrition: Secondary | ICD-10-CM

## 2022-02-24 LAB — CBC WITH DIFFERENTIAL/PLATELET
Abs Immature Granulocytes: 0.09 10*3/uL — ABNORMAL HIGH (ref 0.00–0.07)
Basophils Absolute: 0 10*3/uL (ref 0.0–0.1)
Basophils Relative: 0 %
Eosinophils Absolute: 0.2 10*3/uL (ref 0.0–0.5)
Eosinophils Relative: 3 %
HCT: 27 % — ABNORMAL LOW (ref 39.0–52.0)
Hemoglobin: 8.5 g/dL — ABNORMAL LOW (ref 13.0–17.0)
Immature Granulocytes: 2 %
Lymphocytes Relative: 10 %
Lymphs Abs: 0.5 10*3/uL — ABNORMAL LOW (ref 0.7–4.0)
MCH: 31.6 pg (ref 26.0–34.0)
MCHC: 31.5 g/dL (ref 30.0–36.0)
MCV: 100.4 fL — ABNORMAL HIGH (ref 80.0–100.0)
Monocytes Absolute: 0.4 10*3/uL (ref 0.1–1.0)
Monocytes Relative: 7 %
Neutro Abs: 4 10*3/uL (ref 1.7–7.7)
Neutrophils Relative %: 78 %
Platelets: 294 10*3/uL (ref 150–400)
RBC: 2.69 MIL/uL — ABNORMAL LOW (ref 4.22–5.81)
RDW: 16.4 % — ABNORMAL HIGH (ref 11.5–15.5)
WBC: 5.2 10*3/uL (ref 4.0–10.5)
nRBC: 0 % (ref 0.0–0.2)

## 2022-02-24 LAB — COMPREHENSIVE METABOLIC PANEL
ALT: 17 U/L (ref 0–44)
AST: 24 U/L (ref 15–41)
Albumin: <1.5 g/dL — ABNORMAL LOW (ref 3.5–5.0)
Alkaline Phosphatase: 138 U/L — ABNORMAL HIGH (ref 38–126)
Anion gap: 4 — ABNORMAL LOW (ref 5–15)
BUN: 12 mg/dL (ref 8–23)
CO2: 31 mmol/L (ref 22–32)
Calcium: 7.8 mg/dL — ABNORMAL LOW (ref 8.9–10.3)
Chloride: 101 mmol/L (ref 98–111)
Creatinine, Ser: 0.31 mg/dL — ABNORMAL LOW (ref 0.61–1.24)
GFR, Estimated: >60 mL/min (ref >60)
Glucose, Bld: 160 mg/dL — ABNORMAL HIGH (ref 70–99)
Potassium: 4.3 mmol/L (ref 3.5–5.1)
Sodium: 136 mmol/L (ref 135–145)
Total Bilirubin: 0.4 mg/dL (ref 0.3–1.2)
Total Protein: 5.9 g/dL — ABNORMAL LOW (ref 6.5–8.1)

## 2022-02-24 LAB — TYPE AND SCREEN
ABO/RH(D): O NEG
Antibody Screen: NEGATIVE
Unit division: 0

## 2022-02-24 LAB — GLUCOSE, CAPILLARY
Glucose-Capillary: 166 mg/dL — ABNORMAL HIGH (ref 70–99)
Glucose-Capillary: 132 mg/dL — ABNORMAL HIGH (ref 70–99)
Glucose-Capillary: 166 mg/dL — ABNORMAL HIGH (ref 70–99)
Glucose-Capillary: 145 mg/dL — ABNORMAL HIGH (ref 70–99)

## 2022-02-24 LAB — BPAM RBC
Blood Product Expiration Date: 202401102359
ISSUE DATE / TIME: 202312091424
Unit Type and Rh: 9500

## 2022-02-24 LAB — PHOSPHORUS: Phosphorus: 2.7 mg/dL (ref 2.5–4.6)

## 2022-02-24 LAB — MAGNESIUM: Magnesium: 1.7 mg/dL (ref 1.7–2.4)

## 2022-02-24 MED ORDER — SALINE SPRAY 0.65 % NA SOLN
1.0000 | NASAL | Status: DC | PRN
  Administered 2022-02-28: 2 via NASAL
  Filled 2022-02-24: qty 44

## 2022-02-24 MED ORDER — ALBUMIN HUMAN 25 % IV SOLN
25.0000 g | Freq: Four times a day (QID) | INTRAVENOUS | Status: AC
  Administered 2022-02-24 – 2022-02-27 (×12): 25 g via INTRAVENOUS
  Filled 2022-02-24 (×12): qty 100

## 2022-02-24 MED ORDER — METOCLOPRAMIDE HCL 5 MG PO TABS
5.0000 mg | ORAL_TABLET | Freq: Two times a day (BID) | ORAL | Status: DC
  Administered 2022-02-25 – 2022-03-01 (×9): 5 mg via ORAL
  Filled 2022-02-24 (×9): qty 1

## 2022-02-24 MED ORDER — METOPROLOL TARTRATE 25 MG/10 ML ORAL SUSPENSION
6.2500 mg | Freq: Two times a day (BID) | ORAL | Status: DC
  Administered 2022-02-24 – 2022-03-01 (×9): 6.25 mg via ORAL
  Filled 2022-02-24 (×12): qty 5

## 2022-02-24 MED ORDER — METOPROLOL TARTRATE 12.5 MG HALF TABLET: 12.5000 mg | ORAL_TABLET | Freq: Two times a day (BID) | ORAL | Status: DC

## 2022-02-24 MED ORDER — FLUTICASONE PROPIONATE 50 MCG/ACT NA SUSP
1.0000 | NASAL | Status: DC | PRN
  Filled 2022-02-24: qty 16

## 2022-02-24 MED ORDER — FUROSEMIDE 10 MG/ML IJ SOLN
20.0000 mg | Freq: Once | INTRAMUSCULAR | Status: AC
  Administered 2022-02-24: 20 mg via INTRAVENOUS
  Filled 2022-02-24: qty 2

## 2022-02-24 MED ORDER — MAGNESIUM SULFATE 2 GM/50ML IV SOLN
2.0000 g | Freq: Once | INTRAVENOUS | Status: AC
  Administered 2022-02-24: 2 g via INTRAVENOUS
  Filled 2022-02-24: qty 50

## 2022-02-24 MED ORDER — FUROSEMIDE 10 MG/ML IJ SOLN
40.0000 mg | Freq: Every day | INTRAMUSCULAR | Status: AC
  Administered 2022-02-25 – 2022-02-27 (×3): 40 mg via INTRAVENOUS
  Filled 2022-02-24 (×3): qty 4

## 2022-02-24 NOTE — Progress Notes (Signed)
Progress Note    Richard [name]   IOE:[id number]  DOB: [birth year]  DOA: 02/07/2022     16 PCP: [name], MD  Initial CC: abdominal pain  Hospital Course: Richard [name] is a 66 y.o. male with PMH significant for DM2, HTN, HLD, obesity, OSA, hypogonadism, peripheral neuropathy.  11/7, seen in the office by Dr. [name] with complaint of diarrhea for about 3 to 4 months, 6-8 episodes a day, mixed with blood associated with abdominal cramping and progressive weakness.  Stool studies at that time were negative for infectious etiology.  Sed rate was elevated 72, fecal calprotectin was elevated to 3570. 11/20, underwent colonoscopy, found to have diffuse colitis with deep ulcerations throughout the entire colon friable mucosa as well as extensive mucosal edema in the rectum.  He was started on prednisone 40 mg daily.  After colonoscopy, patient developed worsening abdominal pain. 11/23, at home patient had 1 episode of syncope.  Also had a fever.  Abdominal pain worsened and hence came to the ED.   In the ED, he had fever of 102.1 with tachycardia. CT abdomen pelvis showed perforated bowel with moderate free fluid and free air.  Started on empiric IV antibiotics, IV fluid, and was admitted to ICU.  General surgery was consulted 11/24, patient underwent exploratory laparotomy, descending and sigmoid colon resection, transverse colostomy by Dr. [name]. Surgical pathology showed transmural inflammation consistent with IBD. His postop course was complicated by almost 2 weeks of postop ileus which gradually resolved.  In the 2 weeks duration, he had significant fluid and electrolyte imbalance, hypoxia, fever, encephalopathy and physical deconditioning.  He received TPN, with plan to taper on 02/23/2022. He has also been evaluated by therapy and recommended for CIR with evaluation pending.  Interval History:  No events overnight.  Wife present bedside this morning.  Reviewed his workup thus  far and upcoming plan for CIR evaluation.  Assessment and Plan:  Resolved sepsis POA  Acute peritonitis d/t perforated sigmoid colon, POA S/p exploratory laparotomy, descending and sigmoid colon resection, transverse colostomy - 02/08/22 Resolved postop ileus NG tube out on 12/5.  TPN weaning off starting 02/23/22 -Continue monitoring ostomy output -Completed 5-day course of meropenem and vancomycin due to breakthrough fevers.  Other considered differential would be atelectasis given body habitus and prolonged bedbound status   Edema Moderate Protein Calorie Malnutrition Significant hypoalbuminemia -Certainly multifactorial at this point given significant amount of fluids received during hospitalization along with severe hypoalbuminemia - Some nutrition restored with TPN but suspect he is significantly depleted of protein - Given generalized edema and net positive volume status, would like to see how he tolerates trial of Lasix with albumin -Monitor renal function while on Lasix and albumin -Monitor blood pressure as well, suspect volume expansion with albumin should mitigate Lasix effect  Inflammatory bowel disease Appreciate GIs assistance. Surgical pathology report showed transmural inflammation consistent with IBD.   Noted a plan from GI to start on Remicade in 4 to 6 weeks  Acute blood loss anemia/anemia of chronic disease Post intra-abdominal surgery -Continue trending hemoglobin intermittently -Suspect anemia multifactorial in setting of surgery and critical illness - Received 1 unit PRBC on 02/23/2022   Postop respiratory insufficiency - Probably due to atelectasis from immobility, postop abdominal distention, third spacing of fluid -Continue weaning oxygen as able - Continue encouraging incentive spirometer - Need to start mobilizing as able.  CIR eval pending   Hypokalemia/hyperkalemia  Hypomagnesemia/hypophosphatemia Acute metabolic alkalosis  His postop course was  complicated by significant fluid/electrolyte abnormalities including severe metabolic alkalosis of 7.6 due to high output gastric secretion. Nephrology was consulted.  Acid-base balance has eventually improved Electrolytes being monitored.   Type 2 diabetes mellitus with hyperglycemia A1c 6.6 on 01/23/2022 PTA on metformin, Jardiance, hold off home oral hypoglycemics Currently on sliding scale insulin.   Resolved acute metabolic encephalopathy Mentation is back to baseline.   Impaired mobility/generalized weakness Significant weakness.  Multifactorial: Low-level electrolytes, poor oral intake, dehydration, physical deconditioning. PT OT with fall precautions. CIR evaluation for possible admission to CIR.   H/o Essential HTN lisinopril remains on hold Closely monitor vital signs.   HLD Lipitor remains on hold   Obesity -Body mass index is 39.85 kg/m. Patient has been advised to make an attempt to improve diet and exercise patterns to aid in weight loss.   OSA Baseline CPAP 10 cm H20, followed by Dr. Ander Slade CPAP QSH     Hx of ADHD methylphenidate on hold   Hx of Hypogonadism. Was on outpatient testosterone    Old records reviewed in assessment of this patient   DVT prophylaxis:  Place and maintain sequential compression device Start: 02/08/22 0001 Lovenox  Code Status:   Code Status: Full Code  Mobility Assessment (last 72 hours)     Mobility Assessment     Row Name 02/22/22 1209 02/21/22 1450 02/21/22 1243       What is the highest level of mobility based on the progressive mobility assessment? Level 3 (Stands with assist) - Balance while standing  and cannot march in place Level 3 (Stands with assist) - Balance while standing  and cannot march in place Level 3 (Stands with assist) - Balance while standing  and cannot march in place              Barriers to discharge:  Disposition Plan:  Possibly CIR Status is: Inpt  Objective: Blood pressure (!)  113/59, pulse (!) 116, temperature (!) 100.7 F (38.2 C), temperature source Oral, resp. rate (!) 23, height '5\' 9"'$  (1.753 m), weight 121.6 kg, SpO2 96 %.  Examination:  Physical Exam Constitutional:      General: He is not in acute distress.    Appearance: Normal appearance. He is obese. He is not ill-appearing.  HENT:     Head: Normocephalic and atraumatic.     Mouth/Throat:     Mouth: Mucous membranes are moist.  Eyes:     Extraocular Movements: Extraocular movements intact.  Cardiovascular:     Rate and Rhythm: Normal rate and regular rhythm.  Pulmonary:     Comments: Coarse breath sounds throughout, no wheezing Abdominal:     Comments: Mild distention, obese.  Soft.  Appropriately tender to palpation.  Ostomy bag in place with brown stool noted  Musculoskeletal:        General: Swelling present.     Cervical back: Normal range of motion and neck supple.     Right lower leg: Edema present.     Left lower leg: Edema present.     Comments: Diffuse and generalized edema throughout  Skin:    General: Skin is warm and dry.  Neurological:     General: No focal deficit present.     Mental Status: He is alert.  Psychiatric:        Mood and Affect: Mood normal.        Behavior: Behavior normal.      Consultants:  General surgery  Procedures:    Data Reviewed:  Results for orders placed or performed during the hospital encounter of 02/07/22 (from the past 24 hour(s))  Glucose, capillary     Status: Abnormal   Collection Time: 02/23/22 11:47 AM  Result Value Ref Range   Glucose-Capillary 136 (H) 70 - 99 mg/dL  Glucose, capillary     Status: Abnormal   Collection Time: 02/23/22  4:37 PM  Result Value Ref Range   Glucose-Capillary 168 (H) 70 - 99 mg/dL  Glucose, capillary     Status: Abnormal   Collection Time: 02/23/22  9:47 PM  Result Value Ref Range   Glucose-Capillary 186 (H) 70 - 99 mg/dL   Comment 1 Notify RN    Comment 2 Document in Chart   CBC with  Differential/Platelet     Status: Abnormal   Collection Time: 02/24/22  5:30 AM  Result Value Ref Range   WBC 5.2 4.0 - 10.5 K/uL   RBC 2.69 (L) 4.22 - 5.81 MIL/uL   Hemoglobin 8.5 (L) 13.0 - 17.0 g/dL   HCT 27.0 (L) 39.0 - 52.0 %   MCV 100.4 (H) 80.0 - 100.0 fL   MCH 31.6 26.0 - 34.0 pg   MCHC 31.5 30.0 - 36.0 g/dL   RDW 16.4 (H) 11.5 - 15.5 %   Platelets 294 150 - 400 K/uL   nRBC 0.0 0.0 - 0.2 %   Neutrophils Relative % 78 %   Neutro Abs 4.0 1.7 - 7.7 K/uL   Lymphocytes Relative 10 %   Lymphs Abs 0.5 (L) 0.7 - 4.0 K/uL   Monocytes Relative 7 %   Monocytes Absolute 0.4 0.1 - 1.0 K/uL   Eosinophils Relative 3 %   Eosinophils Absolute 0.2 0.0 - 0.5 K/uL   Basophils Relative 0 %   Basophils Absolute 0.0 0.0 - 0.1 K/uL   Immature Granulocytes 2 %   Abs Immature Granulocytes 0.09 (H) 0.00 - 0.07 K/uL  Magnesium     Status: None   Collection Time: 02/24/22  5:30 AM  Result Value Ref Range   Magnesium 1.7 1.7 - 2.4 mg/dL  Phosphorus     Status: None   Collection Time: 02/24/22  5:30 AM  Result Value Ref Range   Phosphorus 2.7 2.5 - 4.6 mg/dL  Comprehensive metabolic panel     Status: Abnormal   Collection Time: 02/24/22  5:30 AM  Result Value Ref Range   Sodium 136 135 - 145 mmol/L   Potassium 4.3 3.5 - 5.1 mmol/L   Chloride 101 98 - 111 mmol/L   CO2 31 22 - 32 mmol/L   Glucose, Bld 160 (H) 70 - 99 mg/dL   BUN 12 8 - 23 mg/dL   Creatinine, Ser 0.31 (L) 0.61 - 1.24 mg/dL   Calcium 7.8 (L) 8.9 - 10.3 mg/dL   Total Protein 5.9 (L) 6.5 - 8.1 g/dL   Albumin <1.5 (L) 3.5 - 5.0 g/dL   AST 24 15 - 41 U/L   ALT 17 0 - 44 U/L   Alkaline Phosphatase 138 (H) 38 - 126 U/L   Total Bilirubin 0.4 0.3 - 1.2 mg/dL   GFR, Estimated >60 >60 mL/min   Anion gap 4 (L) 5 - 15  Glucose, capillary     Status: Abnormal   Collection Time: 02/24/22  8:33 AM  Result Value Ref Range   Glucose-Capillary 166 (H) 70 - 99 mg/dL    I have Reviewed nursing notes, Vitals, and Lab results since pt's last  encounter. Pertinent lab results : see  above I have ordered test including BMP, CBC, Mg I have reviewed the last note from staff over past 24 hours I have discussed pt's care plan and test results with nursing staff, case manager  Time spent: Greater than 50% of the 55 minute visit was spent in counseling/coordination of care for the patient as laid out in the A&P.    LOS: 16 days   Dwyane Dee, MD [facility] 02/24/2022, 11:36 AM

## 2022-02-24 NOTE — Progress Notes (Signed)
RT inquired about cpap pt refused again.

## 2022-02-24 NOTE — Progress Notes (Signed)
Pharmacy: Electrolytes  Mg 1.7  Plan: Mag 2 gm IVPB x 1 per Elink protocol  Eudelia Bunch, Pharm.D Use secure chat for questions 02/24/2022 8:23 AM

## 2022-02-24 NOTE — Progress Notes (Signed)
Patient did not have a blue blood bank band on (old one was cut off). New type and screen ordered.

## 2022-02-24 NOTE — Progress Notes (Signed)
17 Days Post-Op   Subjective/Chief Complaint: Improved PO intake yesterday Weaning off TNA Ostomy productive Increased peripheral edema   Objective: Vital signs in last 24 hours: Temp:  [99 F (37.2 C)-100.7 F (38.2 C)] 100.7 F (38.2 C) (12/10 0400) Pulse Rate:  [100-123] 117 (12/10 0600) Resp:  [17-28] 25 (12/10 0600) BP: (110-122)/(60-68) 111/68 (12/10 0400) SpO2:  [87 %-100 %] 93 % (12/10 0600) Weight:  [121.6 kg] 121.6 kg (12/10 0500) Last BM Date : 02/24/22  Intake/Output from previous day: 12/09 0701 - 12/10 0700 In: 1219.4 [P.O.:240; I.V.:571.4; Blood:248; IV Piggyback:160] Out: 2900 [Urine:2000; Stool:900] Intake/Output this shift: No intake/output data recorded.  WDWN in NAD Awake, alert Significant peripheral and truncal edema Abd - soft, non-tender;  Wound - clean, well-granulated Ostomy pink, stool output  Lab Results:  Recent Labs    02/23/22 0500 02/24/22 0530  WBC 5.0 5.2  HGB 7.0* 8.5*  HCT 22.7* 27.0*  PLT 265 294   BMET Recent Labs    02/23/22 0500 02/24/22 0530  NA 137 136  K 4.1 4.3  CL 102 101  CO2 32 31  GLUCOSE 142* 160*  BUN 11 12  CREATININE 0.36* 0.31*  CALCIUM 7.7* 7.8*   PT/INR No results for input(s): "LABPROT", "INR" in the last 72 hours. ABG No results for input(s): "PHART", "HCO3" in the last 72 hours.  Invalid input(s): "PCO2", "PO2"  Studies/Results: No results found.  Anti-infectives: Anti-infectives (From admission, onward)    Start     Dose/Rate Route Frequency Ordered Stop   02/15/22 1400  meropenem (MERREM) 1 g in sodium chloride 0.9 % 100 mL IVPB  Status:  Discontinued        1 g 200 mL/hr over 30 Minutes Intravenous Every 8 hours 02/15/22 1244 02/19/22 1709   02/15/22 1400  vancomycin (VANCOCIN) IVPB 1000 mg/200 mL premix  Status:  Discontinued        1,000 mg 200 mL/hr over 60 Minutes Intravenous Every 12 hours 02/15/22 1304 02/18/22 1445   02/08/22 1200  piperacillin-tazobactam (ZOSYN) IVPB  3.375 g  Status:  Discontinued        3.375 g 12.5 mL/hr over 240 Minutes Intravenous Every 8 hours 02/08/22 0505 02/15/22 1243   02/08/22 0500  piperacillin-tazobactam (ZOSYN) IVPB 3.375 g  Status:  Discontinued        3.375 g 12.5 mL/hr over 240 Minutes Intravenous Every 8 hours 02/08/22 0006 02/08/22 0505   02/08/22 0334  piperacillin-tazobactam (ZOSYN) 3.375 GM/50ML IVPB       Note to Pharmacy: Enis Gash W: cabinet override      02/08/22 0334 02/08/22 0345   02/07/22 2030  ceFEPIme (MAXIPIME) 2 g in sodium chloride 0.9 % 100 mL IVPB        2 g 200 mL/hr over 30 Minutes Intravenous  Once 02/07/22 2023 02/07/22 2248   02/07/22 2030  metroNIDAZOLE (FLAGYL) IVPB 500 mg        500 mg 100 mL/hr over 60 Minutes Intravenous  Once 02/07/22 2023 02/08/22 0506   02/07/22 2030  vancomycin (VANCOCIN) IVPB 1000 mg/200 mL premix        1,000 mg 200 mL/hr over 60 Minutes Intravenous  Once 02/07/22 2023 02/08/22 0506       Assessment/Plan: Sigmoid perforation  S/P exploratory laparotomy, colon resection, end colostomy 02/07/22 Dr. [name] - Continue diet, will order calorie count to get a better idea of PO intake, wean TPN off today - CT 12/1 with some free fluid but no  organized collection, CT 12/5 with no major changes - GI following as well for IBD, fairly new diagnosis -Patient must mobilize   Significant peripheral edema - may need more aggressive diuresis; renal function/ K seems to be normal FEN: reg diet, TPN weaning off VTE: LMWH ID: vanc/flagyl/cefepime 11/23; zosyn 11/24>11/29; vanc 12/1>12/4; merrem 12/1> 12/5   - below per TRH -  Post-operative respiratory insufficiency T2DM Physical deconditioning  Acute metabolic encephalopathy, improving  HTN HLD Obesity class I OSA Hypogonadism Moderate protein calorie malnutrition  ADHD/Anxiety       LOS: 16 days    Maia Petties 02/24/2022

## 2022-02-24 NOTE — Progress Notes (Signed)
[facility] ROUNDING NOTE   Subjective: No acute events overnight.  Patient feeling about the same or a little better compared to yesterday.  He received 1 unit of blood.  He just received a dose of Lasix later this morning and has been putting a lot of urine.  He continues to tolerate soft diet without abdominal pain, nausea/vomiting.  His ostomy is putting out copious stool.   Objective: Vital signs in last 24 hours: Temp:  [99.2 F (37.3 C)-100.7 F (38.2 C)] 100.7 F (38.2 C) (12/10 0400) Pulse Rate:  [100-123] 116 (12/10 0900) Resp:  [16-28] 23 (12/10 0900) BP: (110-122)/(59-68) 113/59 (12/10 0800) SpO2:  [87 %-100 %] 96 % (12/10 0900) Weight:  [121.6 kg] 121.6 kg (12/10 0500) Last BM Date : 02/24/22 General: NAD Lungs:  CTA b/l, no w/r/r Heart:  RRR, no m/r/g Abdomen:  Soft, mildly distended, ND, bowel sounds hypoactive but normal in pitch Ext: 2+ bilateral lower extremity edema    Intake/Output from previous day: 12/09 0701 - 12/10 0700 In: 1219.4 [P.O.:240; I.V.:571.4; Blood:248; IV Piggyback:160] Out: 2900 [Urine:2000; Stool:900] Intake/Output this shift: Total I/O In: -  Out: 800 [Urine:650; Stool:150]   Lab Results: Recent Labs    02/22/22 0459 02/23/22 0500 02/24/22 0530  WBC 4.7 5.0 5.2  HGB 7.1* 7.0* 8.5*  PLT 260 265 294  MCV 105.1* 105.6* 100.4*   BMET Recent Labs    02/22/22 0459 02/23/22 0500 02/24/22 0530  NA 132* 137 136  K 4.1 4.1 4.3  CL 99 102 101  CO2 28 32 31  GLUCOSE 162* 142* 160*  BUN '9 11 12  '$ CREATININE 0.33* 0.36* 0.31*  CALCIUM 7.1* 7.7* 7.8*   LFT Recent Labs    02/22/22 0459 02/24/22 0530  PROT 5.3* 5.9*  ALBUMIN <1.5* <1.5*  AST 24 24  ALT 15 17  ALKPHOS 93 138*  BILITOT 0.2* 0.4   PT/INR No results for input(s): "INR" in the last 72 hours.    Imaging/Other results: No results found.    Assessment and Plan: 66 yo male with newly diagnosed IBD (favor Crohn's colitis).  Admitted with  delayed colonic perforation following diagnostic colonoscopy, now s/p exploratory lap, sigmoid resection and transverse colostomy on 11/23. Hospital course complicated by post-op ileus and malnutrition His ileus seems to be improving, tolerating soft diet with no pain or nausea and with plenty of ostomy output.  Will transition Reglan to p.o. today. Plan to start Remicade as outpatient, timing TBD.   Ileus - Diet advancement and TPN wean per surgery - Decrease Reglan to twice daily, hopefully discontinue by discharge   Anemia - Agree with transfusion to maintain hemoglobin greater than 7   Crohn's colitis - Plan for outpatient initiation of Remicade (indeterminant QuantiFERON, but negative AFB x 3), timing to be determined  GI will follow patient peripherally at this point.  Please contact us if there are any specific questions or changes in patient's status.  Dr. Rush Landmark will assume inpatient GI responsibilities tomorrow    Daryel November, MD  02/24/2022, 11:52 AM [facility]

## 2022-02-24 NOTE — Hospital Course (Addendum)
Richard [name] is a 66 y.o. male with PMH significant for DM2, HTN, HLD, obesity, OSA, hypogonadism, peripheral neuropathy.  11/7, seen in the office by Dr. [name] with complaint of diarrhea for about 3 to 4 months, 6-8 episodes a day, mixed with blood associated with abdominal cramping and progressive weakness.   11/20, underwent colonoscopy, found to have diffuse colitis with deep ulcerations throughout the entire colon friable mucosa as well as extensive mucosal edema in the rectum.  He was started on prednisone 40 mg daily.  After colonoscopy, patient developed worsening abdominal pain. 11/23, at home patient had 1 episode of syncope.  Also had a fever.  Abdominal pain worsened and hence came to the ED.   In the ED, he had fever of 102.1 with tachycardia. CT abdomen pelvis showed perforated bowel with moderate free fluid and free air. Started on empiric IV antibiotics, IV fluid, and was admitted to ICU.  General surgery was consulted.    11/24, patient underwent exploratory laparotomy, descending and sigmoid colon resection, transverse colostomy by Dr. [name]. Surgical pathology showed transmural inflammation consistent with IBD.  His postop course was complicated by almost 2 weeks of postop ileus which gradually resolved.   He received TPN, which was discontinued on 02/23/2022.  Patient has been tolerating oral intake since.  On 12/11 patient suddenly began to exhibit large volume lower gastrointestinal bleeding.  4 units of packed red blood cells had to be transfused.  During the urgent workup of this bleeding CT angiogram of the abdomen was performed revealing active extravasation of a branch of the SMV consistent with venous hemorrhage.  CT imaging of the abdomen pelvis did incidentally identify a 10 cm fluid filled enhancing collection consistent with early abscess in the left lower quadrant however.    Patient underwent an urgent sigmoidoscopy on 12/12 with Dr. Rush Landmark who identified at  least eight 24 to 50 mm ulcerations found throughout the mid rectum and distal rectum.  Hemostatic gel was applied to the most deep lesion in the mid rectum.  Anticoagulation was held going forward.    Concerning the patient's incidentally found 10 cm abscess in the left lower quadrant, patient was initially placed on intravenous Zosyn for antibiotic coverage.  On 12/12, Dr. Dwaine Gale with interventional radiology successfully inserted a percutaneous JP drain resulting in immediate expression of large amounts of purulent drainage.  In the days that followed patient exhibited no further episodes of bleeding.  In the multiple SIRS criteria the patient was exhibiting quickly resolved.  On 12/13, systemic steroids with intravenous Solu-Medrol were initiated which the patient tolerated well.[name] Kitchen  Hospital course was additionally complicated by the development of a diffuse erythematous rash on 12/14.  Since patient was recently initiated on Flagyl on 12/13 there was initial concern by gastroenterology that this may be the culprit.  This was discontinued at that time however rash continued to persist on 12/15.  Rash continues to be mild.  After further discussion and review of antibiotic regimen with Dr. Juleen China of infectious disease it is felt that the most likely culprit is actually the Zosyn.  Zosyn has since been discontinued.  Because of the growth of E. coli and Bacteroides Thetaiotoaomicron on the patient's 12/12 abdominal abscess culture decision was made to transition the patient to intravenous ceftriaxone and Flagyl for now.  Per Dr. Alcario Drought recommendation, patient should remain on intravenous ceftriaxone and Flagyl until interventional radiology decides to remove the patient's abscess drain.  At that point a decision can be  made whether to stop antibiotics completely or transition the patient to a short course of oral antibiotics.  Patient has gradually clinically improved in the past several days and  arrangements are being made for the patient to be discharged to [facility] for rehabilitation over the next several weeks.  While there, patient will continue to be followed by general surgery and gastroenterology.  Gastroenterology recommends the patient continue to receive intravenous Solu-Medrol before transitioning over to oral steroids.  In the next few days they would likely pursue colonoscopy via the patient's ostomy to help guide initiation of biologic therapy.  Interventional radiology should also continue to follow and discontinue the JP drain in the left lower quadrant when they deem appropriate.  Patient is currently being discharged in improved and stable condition.

## 2022-02-25 ENCOUNTER — Encounter (HOSPITAL_COMMUNITY)
Admission: EM | Disposition: A | Discharge status: Rehabilitation Facility | Payer: Self-pay | Source: Home / Self Care | Attending: Internal Medicine

## 2022-02-25 ENCOUNTER — Encounter (HOSPITAL_COMMUNITY): Payer: Self-pay | Admitting: Pulmonary Disease

## 2022-02-25 ENCOUNTER — Inpatient Hospital Stay (HOSPITAL_COMMUNITY): Payer: 59

## 2022-02-25 DIAGNOSIS — R933 Abnormal findings on diagnostic imaging of other parts of digestive tract: Secondary | ICD-10-CM

## 2022-02-25 DIAGNOSIS — D62 Acute posthemorrhagic anemia: Secondary | ICD-10-CM | POA: Diagnosis not present

## 2022-02-25 DIAGNOSIS — R609 Edema, unspecified: Secondary | ICD-10-CM

## 2022-02-25 DIAGNOSIS — K922 Gastrointestinal hemorrhage, unspecified: Secondary | ICD-10-CM | POA: Diagnosis not present

## 2022-02-25 DIAGNOSIS — Z933 Colostomy status: Secondary | ICD-10-CM | POA: Diagnosis not present

## 2022-02-25 DIAGNOSIS — K631 Perforation of intestine (nontraumatic): Secondary | ICD-10-CM | POA: Diagnosis not present

## 2022-02-25 DIAGNOSIS — K625 Hemorrhage of anus and rectum: Secondary | ICD-10-CM | POA: Diagnosis not present

## 2022-02-25 HISTORY — PX: HEMOSTASIS CONTROL: SHX6838

## 2022-02-25 HISTORY — PX: FLEXIBLE SIGMOIDOSCOPY: SHX5431

## 2022-02-25 LAB — PREPARE RBC (CROSSMATCH)

## 2022-02-25 LAB — CBC WITH DIFFERENTIAL/PLATELET
Abs Immature Granulocytes: 0.04 10*3/uL (ref 0.00–0.07)
Abs Immature Granulocytes: 0.03 10*3/uL (ref 0.00–0.07)
Basophils Absolute: 0 10*3/uL (ref 0.0–0.1)
Basophils Absolute: 0 10*3/uL (ref 0.0–0.1)
Basophils Relative: 0 %
Basophils Relative: 0 %
Eosinophils Absolute: 0.2 10*3/uL (ref 0.0–0.5)
Eosinophils Absolute: 0.2 10*3/uL (ref 0.0–0.5)
Eosinophils Relative: 4 %
Eosinophils Relative: 4 %
HCT: 24.9 % — ABNORMAL LOW (ref 39.0–52.0)
HCT: 27.2 % — ABNORMAL LOW (ref 39.0–52.0)
Hemoglobin: 7.7 g/dL — ABNORMAL LOW (ref 13.0–17.0)
Hemoglobin: 8.6 g/dL — ABNORMAL LOW (ref 13.0–17.0)
Immature Granulocytes: 1 %
Immature Granulocytes: 1 %
Lymphocytes Relative: 11 %
Lymphocytes Relative: 9 %
Lymphs Abs: 0.5 10*3/uL — ABNORMAL LOW (ref 0.7–4.0)
Lymphs Abs: 0.5 10*3/uL — ABNORMAL LOW (ref 0.7–4.0)
MCH: 31.6 pg (ref 26.0–34.0)
MCH: 32 pg (ref 26.0–34.0)
MCHC: 30.9 g/dL (ref 30.0–36.0)
MCHC: 31.6 g/dL (ref 30.0–36.0)
MCV: 102 fL — ABNORMAL HIGH (ref 80.0–100.0)
MCV: 101.1 fL — ABNORMAL HIGH (ref 80.0–100.0)
Monocytes Absolute: 0.4 10*3/uL (ref 0.1–1.0)
Monocytes Absolute: 0.4 10*3/uL (ref 0.1–1.0)
Monocytes Relative: 8 %
Monocytes Relative: 7 %
Neutro Abs: 3.7 10*3/uL (ref 1.7–7.7)
Neutro Abs: 4.7 10*3/uL (ref 1.7–7.7)
Neutrophils Relative %: 76 %
Neutrophils Relative %: 79 %
Platelets: 254 10*3/uL (ref 150–400)
Platelets: 296 10*3/uL (ref 150–400)
RBC: 2.44 MIL/uL — ABNORMAL LOW (ref 4.22–5.81)
RBC: 2.69 MIL/uL — ABNORMAL LOW (ref 4.22–5.81)
RDW: 15.7 % — ABNORMAL HIGH (ref 11.5–15.5)
RDW: 15.5 % (ref 11.5–15.5)
WBC: 4.9 10*3/uL (ref 4.0–10.5)
WBC: 5.9 10*3/uL (ref 4.0–10.5)
nRBC: 0 % (ref 0.0–0.2)
nRBC: 0.3 % — ABNORMAL HIGH (ref 0.0–0.2)

## 2022-02-25 LAB — HEMOGLOBIN AND HEMATOCRIT, BLOOD
HCT: 25.8 % — ABNORMAL LOW (ref 39.0–52.0)
HCT: 19.1 % — ABNORMAL LOW (ref 39.0–52.0)
Hemoglobin: 8.1 g/dL — ABNORMAL LOW (ref 13.0–17.0)
Hemoglobin: 6.1 g/dL — CL (ref 13.0–17.0)

## 2022-02-25 LAB — COMPREHENSIVE METABOLIC PANEL
ALT: 15 U/L (ref 0–44)
AST: 20 U/L (ref 15–41)
Albumin: 2 g/dL — ABNORMAL LOW (ref 3.5–5.0)
Alkaline Phosphatase: 138 U/L — ABNORMAL HIGH (ref 38–126)
Anion gap: 6 (ref 5–15)
BUN: 9 mg/dL (ref 8–23)
CO2: 32 mmol/L (ref 22–32)
Calcium: 8 mg/dL — ABNORMAL LOW (ref 8.9–10.3)
Chloride: 100 mmol/L (ref 98–111)
Creatinine, Ser: 0.34 mg/dL — ABNORMAL LOW (ref 0.61–1.24)
GFR, Estimated: >60 mL/min (ref >60)
Glucose, Bld: 132 mg/dL — ABNORMAL HIGH (ref 70–99)
Potassium: 3.8 mmol/L (ref 3.5–5.1)
Sodium: 138 mmol/L (ref 135–145)
Total Bilirubin: 0.6 mg/dL (ref 0.3–1.2)
Total Protein: 5.9 g/dL — ABNORMAL LOW (ref 6.5–8.1)

## 2022-02-25 LAB — GLUCOSE, CAPILLARY
Glucose-Capillary: 144 mg/dL — ABNORMAL HIGH (ref 70–99)
Glucose-Capillary: 123 mg/dL — ABNORMAL HIGH (ref 70–99)
Glucose-Capillary: 185 mg/dL — ABNORMAL HIGH (ref 70–99)
Glucose-Capillary: 134 mg/dL — ABNORMAL HIGH (ref 70–99)

## 2022-02-25 LAB — PHOSPHORUS: Phosphorus: 3.3 mg/dL (ref 2.5–4.6)

## 2022-02-25 LAB — MAGNESIUM: Magnesium: 1.6 mg/dL — ABNORMAL LOW (ref 1.7–2.4)

## 2022-02-25 SURGERY — SIGMOIDOSCOPY, FLEXIBLE
Anesthesia: Moderate Sedation

## 2022-02-25 MED ORDER — MAGNESIUM SULFATE 2 GM/50ML IV SOLN
2.0000 g | Freq: Once | INTRAVENOUS | Status: AC
  Administered 2022-02-25: 2 g via INTRAVENOUS
  Filled 2022-02-25: qty 50

## 2022-02-25 MED ORDER — LORAZEPAM 2 MG/ML IJ SOLN: 1.0000 mg | Freq: Once | INTRAMUSCULAR | Status: AC

## 2022-02-25 MED ORDER — SODIUM CHLORIDE 0.9% IV SOLUTION: Freq: Once | INTRAVENOUS | Status: DC

## 2022-02-25 MED ORDER — SODIUM CHLORIDE 0.9 % IV BOLUS
500.0000 mL | Freq: Once | INTRAVENOUS | Status: AC
  Administered 2022-02-25: 500 mL via INTRAVENOUS

## 2022-02-25 MED ORDER — METOCLOPRAMIDE HCL 5 MG/ML IJ SOLN
10.0000 mg | INTRAMUSCULAR | Status: AC
  Administered 2022-02-25: 10 mg via INTRAVENOUS
  Filled 2022-02-25: qty 2

## 2022-02-25 MED ORDER — ACETAMINOPHEN 325 MG PO TABS: 650.0000 mg | ORAL_TABLET | ORAL | Status: DC | PRN

## 2022-02-25 MED ORDER — ENSURE ENLIVE PO LIQD
237.0000 mL | Freq: Three times a day (TID) | ORAL | Status: DC
  Administered 2022-02-26 – 2022-03-01 (×9): 237 mL via ORAL

## 2022-02-25 MED ORDER — IOHEXOL 350 MG/ML SOLN
100.0000 mL | Freq: Once | INTRAVENOUS | Status: AC | PRN
  Administered 2022-02-25: 100 mL via INTRAVENOUS

## 2022-02-25 MED ORDER — PIPERACILLIN-TAZOBACTAM 3.375 G IVPB
3.3750 g | Freq: Three times a day (TID) | INTRAVENOUS | Status: DC
  Administered 2022-02-25 – 2022-03-01 (×11): 3.375 g via INTRAVENOUS
  Filled 2022-02-25 (×11): qty 50

## 2022-02-25 MED ORDER — SODIUM CHLORIDE 0.9% IV SOLUTION: Freq: Once | INTRAVENOUS | Status: AC

## 2022-02-25 MED ORDER — LORAZEPAM 2 MG/ML IJ SOLN
INTRAMUSCULAR | Status: AC
  Administered 2022-02-25: 0.5 mg
  Filled 2022-02-25: qty 1

## 2022-02-25 MED ORDER — SODIUM CHLORIDE 0.9 % IV SOLN: INTRAVENOUS | Status: DC

## 2022-02-25 MED ORDER — ACETAMINOPHEN 10 MG/ML IV SOLN: 1000.0000 mg | Freq: Four times a day (QID) | INTRAVENOUS | Status: DC

## 2022-02-25 MED ORDER — ACETAMINOPHEN 10 MG/ML IV SOLN
1000.0000 mg | Freq: Four times a day (QID) | INTRAVENOUS | Status: AC
  Administered 2022-02-25 – 2022-02-26 (×3): 1000 mg via INTRAVENOUS
  Filled 2022-02-25 (×4): qty 100

## 2022-02-25 NOTE — Progress Notes (Signed)
18 Days Post-Op   Subjective/Chief Complaint: Off TPN and with good appetite this am (cleared his breakfast tray). No pain.    Objective: Vital signs in last 24 hours: Temp:  [98.9 F (37.2 C)-100.6 F (38.1 C)] 98.9 F (37.2 C) (12/11 0400) Pulse Rate:  [93-130] 99 (12/11 0700) Resp:  [17-26] 24 (12/11 0700) BP: (97-123)/(45-61) 111/59 (12/11 0600) SpO2:  [87 %-97 %] 93 % (12/11 0700) Weight:  [120.2 kg] 120.2 kg (12/11 0500) Last BM Date : 02/24/22 (per colostomy)  Intake/Output from previous day: 12/10 0701 - 12/11 0700 In: 613.7 [P.O.:350; I.V.:20; IV Piggyback:243.7] Out: 4287 [Urine:3300; GOTLX:7262] Intake/Output this shift: No intake/output data recorded.  WDWN in NAD Awake, alert Significant peripheral edema Abd - soft, non-tender;  Wound - clean, well-granulated Ostomy pink, air and liquid stool output  Lab Results:  Recent Labs    02/24/22 0530 02/25/22 0550  WBC 5.2 4.9  HGB 8.5* 7.7*  HCT 27.0* 24.9*  PLT 294 254    BMET Recent Labs    02/24/22 0530 02/25/22 0550  NA 136 138  K 4.3 3.8  CL 101 100  CO2 31 32  GLUCOSE 160* 132*  BUN 12 9  CREATININE 0.31* 0.34*  CALCIUM 7.8* 8.0*    PT/INR No results for input(s): "LABPROT", "INR" in the last 72 hours. ABG No results for input(s): "PHART", "HCO3" in the last 72 hours.  Invalid input(s): "PCO2", "PO2"  Studies/Results: No results found.  Anti-infectives: Anti-infectives (From admission, onward)    Start     Dose/Rate Route Frequency Ordered Stop   02/15/22 1400  meropenem (MERREM) 1 g in sodium chloride 0.9 % 100 mL IVPB  Status:  Discontinued        1 g 200 mL/hr over 30 Minutes Intravenous Every 8 hours 02/15/22 1244 02/19/22 1709   02/15/22 1400  vancomycin (VANCOCIN) IVPB 1000 mg/200 mL premix  Status:  Discontinued        1,000 mg 200 mL/hr over 60 Minutes Intravenous Every 12 hours 02/15/22 1304 02/18/22 1445   02/08/22 1200  piperacillin-tazobactam (ZOSYN) IVPB 3.375 g   Status:  Discontinued        3.375 g 12.5 mL/hr over 240 Minutes Intravenous Every 8 hours 02/08/22 0505 02/15/22 1243   02/08/22 0500  piperacillin-tazobactam (ZOSYN) IVPB 3.375 g  Status:  Discontinued        3.375 g 12.5 mL/hr over 240 Minutes Intravenous Every 8 hours 02/08/22 0006 02/08/22 0505   02/08/22 0334  piperacillin-tazobactam (ZOSYN) 3.375 GM/50ML IVPB       Note to Pharmacy: Enis Gash W: cabinet override      02/08/22 0334 02/08/22 0345   02/07/22 2030  ceFEPIme (MAXIPIME) 2 g in sodium chloride 0.9 % 100 mL IVPB        2 g 200 mL/hr over 30 Minutes Intravenous  Once 02/07/22 2023 02/07/22 2248   02/07/22 2030  metroNIDAZOLE (FLAGYL) IVPB 500 mg        500 mg 100 mL/hr over 60 Minutes Intravenous  Once 02/07/22 2023 02/08/22 0506   02/07/22 2030  vancomycin (VANCOCIN) IVPB 1000 mg/200 mL premix        1,000 mg 200 mL/hr over 60 Minutes Intravenous  Once 02/07/22 2023 02/08/22 0506       Assessment/Plan: Sigmoid perforation  POD18 S/P exploratory laparotomy, colon resection, end colostomy 02/07/22 Dr. [name] - Continue diet - tolerating well and now good appetite,TPN off yesterday. Hopefully discontinue reglan soon - ostomy output high (  1221m/24h) - on bid fibercon - CT 12/1 with some free fluid but no organized collection, CT 12/5 with no major changes - GI following as well for IBD, fairly new diagnosis   FEN: reg diet, TPN stopped VTE: LMWH ID: vanc/flagyl/cefepime 11/23; zosyn 11/24>11/29; vanc 12/1>12/4; merrem 12/1> 12/5   - below per TRH -  Post-operative respiratory insufficiency T2DM Physical deconditioning  Acute metabolic encephalopathy, improving  HTN HLD Obesity class I OSA Hypogonadism Moderate protein calorie malnutrition  ADHD/Anxiety       LOS: 17 days    MWinferd Humphrey P[facility] 02/25/2022, 7:24 AM Please see Amion for pager number during day hours 7:00am-4:30pm

## 2022-02-25 NOTE — Progress Notes (Signed)
PT Cancellation Note  Patient Details Name: Richard [name] MRN: [id number] DOB: February 12, 1956   Cancelled Treatment:    Reason Eval/Treat Not Completed: Pain limiting ability to participate, recently  ate and now with abdominal pain complaints. RN will  motify PT when patient ready for therapies. [place] Office [phone] Weekend pager-[phone]    Claretha Cooper 02/25/2022, 9:37 AM

## 2022-02-25 NOTE — Brief Op Note (Signed)
Brief flex sig note as I am having issues with PROVATIO  Flexible sigmoidoscopy performed. Patient was awake and no sedation was administered. Significant clot burden encompassing greater than 75% of the rectal wall and vault for up to 9 cm After greater than 30 minutes of attempt at clot removal we were successful This revealed multiple deep cratered ulcers as well as rectal varices Rectal varices did not look to be a source of active GI blood loss The deep rectal ulcers extend from the midportion of the rectum towards the anal canal with at least 1 going towards the dentate and into the dentate line I suspect the source of active bleeding or recent bleeding was from the ulcer that had hit the hemorrhoid/dentate line I used hemostatic gel/ Purastat in an effort of trying to prevent further bleeding. Should the patient have recurrent significant GI bleeding he may contact the GI service but surgical management with rigid proctoscopy and further interventions may be required. Unfortunately we do not have sclerosant for rectal variceal hemorrhage should that occur. He has no evidence of cirrhotic changes other than hepatomegaly. TIPS would not be a procedure unless overt rectal varices were noted to be actively bleeding. Please hold Lovenox/VTE prophylaxis for at least the next 2 to 3 days as able. Patient may be on clear liquids the rest of today.   Justice Britain, MD [facility] Office # [phone]

## 2022-02-25 NOTE — Consult Note (Addendum)
[place] Nurse ostomy follow up Surgical team following for assessment and plan of care for abd wound.  Ostomy pouch change performed with patient using a hand held mirror and wife participated at the bedside Stoma type/location: Stoma is edematous, above skin level, 2 3/4 inches, too large for a standard pouch.  Peristomal assessment: intact Output: 80 cc liquid brown stool Ostomy pouching: 2pc.  Education provided:  Demonstrated pouch change. Wife assisted with stretching barrier ring to fit the opening and application of 2 piece pouching system, stoma currently too large for a standard size pouch; use supplies: 4 inch pouching system, Kellie Simmering [phone] and  skin barrier ring is Kellie Simmering # [phone]. 2 sets of supplies left at the bedside.  Pt and wife were able to open and close velcro to empty.  Discussed pouching routines and ordering supplies.  Enrolled patient in [place] Start Discharge program: Yes, previously [place] [facility] team will continue to follow later in the week for further teaching sessions.  Thank-you,  Julien Girt MSN, [place], [place], [place], [place]

## 2022-02-25 NOTE — Progress Notes (Signed)
Inpatient Rehab Admissions Coordinator:    CIR following for potential admit; however, I cannot submit case to insurance for auth until Pt. Has updated notes from PT/OT and pt. Declining tx due to pain today. Will continue to follow.   Clemens Catholic, [place], [place] Admissions Coordinator  [phone] ([place]) [phone] (office)

## 2022-02-25 NOTE — H&P (View-Only) (Signed)
Patient ID: Richard [name], male   DOB: Oct 05, 1955, 66 y.o.   MRN: [id number]    Progress Note   Subjective   Day # 18  CC; new diagnosis of IBD favoring Crohn's, status post exploratory lap, colon resection and end colostomy 02/07/2022 for sigmoid perforation  TPN weaned off yesterday High ostomy output-1225 mL last 24 hours  Labs today WBC 4.9/hemoglobin 7.7/hematocrit 24.9 post 1 unit dose past weekend Magnesium 1.6 Potassium 3.8 BUN 9/creatinine 0.34 Albumin 2.0  Tmax 100.6  Patient says he is somewhat uncomfortable after eating, trying to eat smaller amounts, did have some nausea today after he ate breakfast and then took a handful of pills, he was having difficulty urinating this morning, has received Lasix and currently urinating a lot.  He says that he feels as if he may have passed some stool from his rectum earlier today which was a first. He is frustrated with prolonged hospitalization, does not really want to go to rehab, but he is currently unable to ambulate on his own.   Objective   Vital signs in last 24 hours: Temp:  [98.9 F (37.2 C)-100.6 F (38.1 C)] 98.9 F (37.2 C) (12/11 0800) Pulse Rate:  [93-130] 99 (12/11 0700) Resp:  [17-26] 24 (12/11 0700) BP: (97-123)/(45-61) 111/59 (12/11 0600) SpO2:  [92 %-97 %] 93 % (12/11 0700) Weight:  [120.2 kg] 120.2 kg (12/11 0500) Last BM Date : 02/24/22 (per colostomy) General:    Older white male n NAD Heart:  Regular rate and rhythm; no murmurs Lungs: Respirations even and unlabored, lungs CTA bilaterally Abdomen:  Soft, protuberant, bowel sounds somewhat high-pitched active, ostomy with good output, mild rather generalized tenderness, no rebound Extremities:  Without edema. Neurologic:  Alert and oriented,  grossly normal neurologically. Psych:  Cooperative. Normal mood and affect.  Intake/Output from previous day: 12/10 0701 - 12/11 0700 In: 613.7 [P.O.:350; I.V.:20; IV Piggyback:243.7] Out: 6759 [Urine:3300;  FMBWG:6659] Intake/Output this shift: No intake/output data recorded.  Lab Results: Recent Labs    02/23/22 0500 02/24/22 0530 02/25/22 0550  WBC 5.0 5.2 4.9  HGB 7.0* 8.5* 7.7*  HCT 22.7* 27.0* 24.9*  PLT 265 294 254   BMET Recent Labs    02/23/22 0500 02/24/22 0530 02/25/22 0550  NA 137 136 138  K 4.1 4.3 3.8  CL 102 101 100  CO2 32 31 32  GLUCOSE 142* 160* 132*  BUN '11 12 9  '$ CREATININE 0.36* 0.31* 0.34*  CALCIUM 7.7* 7.8* 8.0*   LFT Recent Labs    02/25/22 0550  PROT 5.9*  ALBUMIN 2.0*  AST 20  ALT 15  ALKPHOS 138*  BILITOT 0.6   PT/INR No results for input(s): "LABPROT", "INR" in the last 72 hours.       Assessment / Plan:    #64 66 year old white male with new diagnosis of IBD made at colonoscopy just prior to this admission/favoring Crohn's colitis admitted with delayed colonic perforation and underwent emergent exploratory lap, sigmoid resection and transverse colostomy 02/07/2022.  He had overt peritonitis at the time of surgery. Prolonged hospital course complicated by malnutrition and prolonged ileus  He has made improvement over the past week, TPN was stopped yesterday He is tolerating a solid diet, will need to keep up protein intake with protein supplements etc.  Tolerating solid diet, Reglan transition to p.o.-will plan to gradually decrease dose and try to get him off of Reglan  #2 anemia multifactorial-stable posttransfusion, continue to monitor and keep hemoglobin above 7  #  3 Crohn's colitis in  residual colon-plan will be for initiation of Remicade as an outpatient once he is completely recovered from his prolonged illness  #4 debilitation-patient will benefit from inpatient rehab stay, PT to evaluate today, wife is in favor of inpatient rehab, patient hopes to be able to be discharged home.   Will make sure patient has a follow-up scheduled with Dr. [name] Decreased Reglan dosing with plan to discontinue GI will follow  peripherally  Principal Problem:   Perforation of colon s/p Hartmann colectomy/colostomy 02/08/2022 Active Problems:   Morbid obesity ([place])   Adult ADHD   Allergic rhinitis   SEVERE OBSTRUCTIVE SLEEP APNEA with AHI 94   Hyperlipidemia   Hearing impairment   Borderline hypertension   Protein-calorie malnutrition, severe   Ileus, postoperative (HCC)   Crohn's disease of colon with complication (HCC)   Type 2 diabetes mellitus with microalbuminuria (HCC)   Anxiety   Hypertension   Colon polyps   Colostomy in place [facility])   Acute blood loss anemia     LOS: 17 days   Jenee Spaugh PA-C 02/25/2022, 8:52 AM

## 2022-02-25 NOTE — Progress Notes (Signed)
Pharmacy Antibiotic Note  Richard [name] is a 67 y.o. male admitted on 02/07/2022 with sepsis.  New diagnosis of IBD, acute sigmoid perforation s/p exp laparoscopy, left colectomy and transverse colostomy.   Pharmacy consulted to dose Zosyn for LLQ abscesses Tm 102.1,  WBC 5.9, SCr 0.34  Plan: Zosyn 3.375 gm IV q8hr, infuse each dose over 4 hours Follow up renal function, culture results, and clinical course.    Height: '5\' 9"'$  (175.3 cm) Weight: 120.2 kg (264 lb 15.9 oz) IBW/kg (Calculated) : 70.7  Temp (24hrs), Avg:100.6 F (38.1 C), Min:98.9 F (37.2 C), Max:102.1 F (38.9 C)  Recent Labs  Lab 02/21/22 1738 02/22/22 0459 02/23/22 0500 02/24/22 0530 02/25/22 0550 02/25/22 1201  WBC  --  4.7 5.0 5.2 4.9 5.9  CREATININE <0.30* 0.33* 0.36* 0.31* 0.34*  --      Estimated Creatinine Clearance: 117.8 mL/min (A) (by C-G formula based on SCr of 0.34 mg/dL (L)).    Allergies  Allergen Reactions   Lobster [Shellfish Allergy] Nausea Only   Poison Ivy Extract Itching    Antimicrobials this admission: 11/23 Flagyl, cefepime, vancomycin x 1 11/24 Zosyn >> 12/1  restarted 12/11>> 12/1 meropenem >> 12/5 12/1 vancomycin >> 12/4  Microbiology results: 12/1 BCx: ngF 11/30 AFB: Negative x3 11/27 Bcx: NGF 11/25 UCx: ngf  11/24 MRSA PCR: not detected 11/23 Bcx: NGF 11/23 UCx: 1000 colonies S.epidermidis 12/1 MRSA PCR: not detected   Thank you for allowing pharmacy to be a part of this patient's care.  Eudelia Bunch, Pharm.D Use secure chat for questions 02/25/2022 7:34 PM

## 2022-02-25 NOTE — Progress Notes (Signed)
OT Cancellation Note  Patient Details Name: RAJVIR ERNSTER MRN: [id number] DOB: 29-Mar-1955   Cancelled Treatment:    Reason Eval/Treat Not Completed: Medical issues which prohibited therapy Patient was noted to have passed red colored stool when covers were removed. Nurse immediately called into room and session was stopped. OT to continue to follow and check back as schedule will allow.  Rennie Plowman, MS Acute Rehabilitation Department Office# [phone] 02/25/2022, 11:57 AM

## 2022-02-25 NOTE — Discharge Instructions (Signed)
[facility]      [facility] [phone]  OPEN ABDOMINAL SURGERY: POST OP INSTRUCTIONS  Always review your discharge instruction sheet given to you by the facility where your surgery was performed.  IF YOU HAVE DISABILITY OR FAMILY LEAVE FORMS, YOU MUST BRING THEM TO THE OFFICE FOR PROCESSING.  PLEASE DO NOT GIVE THEM TO YOUR DOCTOR.  A prescription for pain medication may be given to you upon discharge.  Take your pain medication as prescribed, if needed.  If narcotic pain medicine is not needed, then you may take acetaminophen (Tylenol) or ibuprofen (Advil) as needed. Take your usually prescribed medications unless otherwise directed. If you need a refill on your pain medication, please contact your pharmacy. They will contact our office to request authorization.  Prescriptions will not be filled after 5pm or on week-ends. You should follow a light diet the first few days after arrival home, such as soup and crackers, pudding, etc.unless your doctor has advised otherwise. A high-fiber, low fat diet can be resumed as tolerated.   Be sure to include lots of fluids daily. Most patients will experience some swelling and bruising on the chest and neck area.  Ice packs will help.  Swelling and bruising can take several days to resolve Most patients will experience some swelling and bruising in the area of the incision. Ice pack will help. Swelling and bruising can take several days to resolve..  It is common to experience some constipation if taking pain medication after surgery.  Increasing fluid intake and taking a stool softener will usually help or prevent this problem from occurring.  A mild laxative (Milk of Magnesia or Miralax) should be taken according to package directions if there are no bowel movements after 48 hours.  You may have steri-strips (small skin tapes) in place directly over the incision.  These strips should be left on the skin for 7-10 days.  If your surgeon used skin  glue on the incision, you may shower in 24 hours.  The glue will flake off over the next 2-3 weeks.  Any sutures or staples will be removed at the office during your follow-up visit. You may find that a light gauze bandage over your incision may keep your staples from being rubbed or pulled. You may shower and replace the bandage daily. ACTIVITIES:  You may resume regular (light) daily activities beginning the next day--such as daily self-care, walking, climbing stairs--gradually increasing activities as tolerated.  You may have sexual intercourse when it is comfortable.  Refrain from any heavy lifting or straining until approved by your doctor. You may drive when you no longer are taking prescription pain medication, you can comfortably wear a seatbelt, and you can safely maneuver your car and apply brakes Return to Work: ___________________________________ You should see your doctor in the office for a follow-up appointment approximately two weeks after your surgery.  Make sure that you call for this appointment within a day or two after you arrive home to insure a convenient appointment time. OTHER INSTRUCTIONS:  _____________________________________________________________ _____________________________________________________________  WHEN TO CALL YOUR DOCTOR: Fever over 101.0 Inability to urinate Nausea and/or vomiting Extreme swelling or bruising Continued bleeding from incision. Increased pain, redness, or drainage from the incision. Difficulty swallowing or breathing Muscle cramping or spasms. Numbness or tingling in hands or feet or around lips.  The clinic staff is available to answer your questions during regular business hours.  Please don't hesitate to call and ask to speak to one of   the nurses if you have concerns.  For further questions, please visit [email or web address]  MIDLINE WOUND CARE: - midline dressing to be changed twice daily - supplies: sterile saline,  kerlix/gauze, scissors, ABD pads, tape  - remove dressing and all packing carefully, moistening with sterile saline as needed to avoid packing/internal dressing sticking to the wound. - clean edges of skin around the wound with water/gauze, making sure there is no tape debris or leakage left on skin that could cause skin irritation or breakdown. - dampen and clean kerlix with sterile saline and pack wound from wound base to skin level, making sure to take note of any possible areas of wound tracking, tunneling and packing appropriately. Wound can be packed loosely. Trim kerlix to size if a whole kerlix is not required. - cover wound with a dry ABD pad and secure with tape.  - change dressing as needed if leakage occurs, wound gets contaminated, or if you would like to shower. - you may shower daily with wound open and following the shower the wound should be dried and a clean dressing placed.

## 2022-02-25 NOTE — Progress Notes (Signed)
Brief Nutrition Note  Calorie count sheet not hanging on patient's door to collect. Per discussion with RN, wife and patient both stated MDs had discontinued calorie count as patient eating well. TPN weaned off 12/9.  Patient documented to be consuming an average of 83% of meals the past 4 days. Surgery note this AM reports patient had 100% of breakfast. Patient also noted to be consuming Ensure daily. As patient eating well and TPN already weaned off, do not feel calorie count is still needed. Will continue to monitor and follow per protocol.  Samson Frederic RD, LDN For contact information, refer to [facility].

## 2022-02-25 NOTE — Progress Notes (Signed)
LUE venous duplex has been completed.   Results can be found under chart review under CV PROC. 02/25/2022 1:17 PM Arraya Buck RVT, RDMS

## 2022-02-25 NOTE — Progress Notes (Signed)
Patient ID: RYNELL CIOTTI, male   DOB: [birth year], 66 y.o.   MRN: [id number]    Progress Note   Subjective   Day # 18  CC; new diagnosis of IBD favoring Crohn's, status post exploratory lap, colon resection and end colostomy 02/07/2022 for sigmoid perforation  TPN weaned off yesterday High ostomy output-1225 mL last 24 hours  Labs today WBC 4.9/hemoglobin 7.7/hematocrit 24.9 post 1 unit dose past weekend Magnesium 1.6 Potassium 3.8 BUN 9/creatinine 0.34 Albumin 2.0  Tmax 100.6  Patient says he is somewhat uncomfortable after eating, trying to eat smaller amounts, did have some nausea today after he ate breakfast and then took a handful of pills, he was having difficulty urinating this morning, has received Lasix and currently urinating a lot.  He says that he feels as if he may have passed some stool from his rectum earlier today which was a first. He is frustrated with prolonged hospitalization, does not really want to go to rehab, but he is currently unable to ambulate on his own.   Objective   Vital signs in last 24 hours: Temp:  [98.9 F (37.2 C)-100.6 F (38.1 C)] 98.9 F (37.2 C) (12/11 0800) Pulse Rate:  [93-130] 99 (12/11 0700) Resp:  [17-26] 24 (12/11 0700) BP: (97-123)/(45-61) 111/59 (12/11 0600) SpO2:  [92 %-97 %] 93 % (12/11 0700) Weight:  [120.2 kg] 120.2 kg (12/11 0500) Last BM Date : 02/24/22 (per colostomy) General:    Older white male n NAD Heart:  Regular rate and rhythm; no murmurs Lungs: Respirations even and unlabored, lungs CTA bilaterally Abdomen:  Soft, protuberant, bowel sounds somewhat high-pitched active, ostomy with good output, mild rather generalized tenderness, no rebound Extremities:  Without edema. Neurologic:  Alert and oriented,  grossly normal neurologically. Psych:  Cooperative. Normal mood and affect.  Intake/Output from previous day: 12/10 0701 - 12/11 0700 In: 613.7 [P.O.:350; I.V.:20; IV Piggyback:243.7] Out: 2694 [Urine:3300;  WNIOE:7035] Intake/Output this shift: No intake/output data recorded.  Lab Results: Recent Labs    02/23/22 0500 02/24/22 0530 02/25/22 0550  WBC 5.0 5.2 4.9  HGB 7.0* 8.5* 7.7*  HCT 22.7* 27.0* 24.9*  PLT 265 294 254   BMET Recent Labs    02/23/22 0500 02/24/22 0530 02/25/22 0550  NA 137 136 138  K 4.1 4.3 3.8  CL 102 101 100  CO2 32 31 32  GLUCOSE 142* 160* 132*  BUN '11 12 9  '$ CREATININE 0.36* 0.31* 0.34*  CALCIUM 7.7* 7.8* 8.0*   LFT Recent Labs    02/25/22 0550  PROT 5.9*  ALBUMIN 2.0*  AST 20  ALT 15  ALKPHOS 138*  BILITOT 0.6   PT/INR No results for input(s): "LABPROT", "INR" in the last 72 hours.       Assessment / Plan:    #31 66 year old white male with new diagnosis of IBD made at colonoscopy just prior to this admission/favoring Crohn's colitis admitted with delayed colonic perforation and underwent emergent exploratory lap, sigmoid resection and transverse colostomy 02/07/2022.  He had overt peritonitis at the time of surgery. Prolonged hospital course complicated by malnutrition and prolonged ileus  He has made improvement over the past week, TPN was stopped yesterday He is tolerating a solid diet, will need to keep up protein intake with protein supplements etc.  Tolerating solid diet, Reglan transition to p.o.-will plan to gradually decrease dose and try to get him off of Reglan  #2 anemia multifactorial-stable posttransfusion, continue to monitor and keep hemoglobin above 7  #  3 Crohn's colitis in  residual colon-plan will be for initiation of Remicade as an outpatient once he is completely recovered from his prolonged illness  #4 debilitation-patient will benefit from inpatient rehab stay, PT to evaluate today, wife is in favor of inpatient rehab, patient hopes to be able to be discharged home.   Will make sure patient has a follow-up scheduled with Dr. [name] Decreased Reglan dosing with plan to discontinue GI will follow  peripherally  Principal Problem:   Perforation of colon s/p Hartmann colectomy/colostomy 02/08/2022 Active Problems:   Morbid obesity ([place])   Adult ADHD   Allergic rhinitis   SEVERE OBSTRUCTIVE SLEEP APNEA with AHI 94   Hyperlipidemia   Hearing impairment   Borderline hypertension   Protein-calorie malnutrition, severe   Ileus, postoperative (HCC)   Crohn's disease of colon with complication (HCC)   Type 2 diabetes mellitus with microalbuminuria (HCC)   Anxiety   Hypertension   Colon polyps   Colostomy in place [facility])   Acute blood loss anemia     LOS: 17 days   Mikylah Ackroyd PA-C 02/25/2022, 8:52 AM

## 2022-02-25 NOTE — Progress Notes (Signed)
Progress Note    Richard [name]   CVE:[id number]  DOB: March 29, 1955  DOA: 02/07/2022     17 PCP: [name], MD  Initial CC: abdominal pain  Hospital Course: Richard [name] is a 66 y.o. male with PMH significant for DM2, HTN, HLD, obesity, OSA, hypogonadism, peripheral neuropathy.  11/7, seen in the office by Dr. [name] with complaint of diarrhea for about 3 to 4 months, 6-8 episodes a day, mixed with blood associated with abdominal cramping and progressive weakness.  Stool studies at that time were negative for infectious etiology.  Sed rate was elevated 72, fecal calprotectin was elevated to 3570. 11/20, underwent colonoscopy, found to have diffuse colitis with deep ulcerations throughout the entire colon friable mucosa as well as extensive mucosal edema in the rectum.  He was started on prednisone 40 mg daily.  After colonoscopy, patient developed worsening abdominal pain. 11/23, at home patient had 1 episode of syncope.  Also had a fever.  Abdominal pain worsened and hence came to the ED.   In the ED, he had fever of 102.1 with tachycardia. CT abdomen pelvis showed perforated bowel with moderate free fluid and free air.  Started on empiric IV antibiotics, IV fluid, and was admitted to ICU.  General surgery was consulted 11/24, patient underwent exploratory laparotomy, descending and sigmoid colon resection, transverse colostomy by Dr. [name]. Surgical pathology showed transmural inflammation consistent with IBD. His postop course was complicated by almost 2 weeks of postop ileus which gradually resolved.  In the 2 weeks duration, he had significant fluid and electrolyte imbalance, hypoxia, fever, encephalopathy and physical deconditioning.  He received TPN, with plan to taper on 02/23/2022. He has also been evaluated by therapy and recommended for CIR with evaluation pending.  Interval History:  No events overnight and was doing well this morning. Early afternoon he developed  BRBPR multiple times. His BP downtrended some and he was also ordered 2 units PRBC. GI and surgery aware. Stat CTA ordered to evaluate as well.   I evaluated bedside as well prior to CTA. Clotted red blood noted under scrotum with soaked towel underneath.   Assessment and Plan:  Acute blood loss anemia Anemia of chronic disease - had been stable with Hgb post op; developed BRBPR on 12/11  - Hgb was actually up when checked during episode but did drop some; 2 units PRBC given due to active bleed -Continue trending hemoglobin - follow up stat CTA  Acute peritonitis d/t perforated sigmoid colon, POA Sepsis, POA - resolved  S/p exploratory laparotomy, descending and sigmoid colon resection, transverse colostomy - 02/08/22 Resolved postop ileus NG tube out on 12/5.  TPN weaned off starting 02/23/22 -Continue monitoring ostomy output -Completed 5-day course of meropenem and vancomycin due to breakthrough fevers.  Other considered differential would be atelectasis given body habitus and prolonged bedbound status   Edema Moderate Protein Calorie Malnutrition Significant hypoalbuminemia -Certainly multifactorial at this point given significant amount of fluids received during hospitalization along with severe hypoalbuminemia - Some nutrition restored with TPN but suspect he is significantly depleted of protein - Given generalized edema and net positive volume status, would like to see how he tolerates trial of Lasix with albumin -Monitor renal function while on Lasix and albumin  Inflammatory bowel disease Appreciate GIs assistance. Surgical pathology report showed transmural inflammation consistent with IBD.   Noted a plan from GI to start on Remicade in 4 to 6 weeks   Postop respiratory insufficiency - Probably due to  atelectasis from immobility, postop abdominal distention, third spacing of fluid -Continue weaning oxygen as able - Continue encouraging incentive spirometer - Need to  start mobilizing as able.  CIR eval pending   Hypokalemia/hyperkalemia  Hypomagnesemia/hypophosphatemia Acute metabolic alkalosis  His postop course was complicated by significant fluid/electrolyte abnormalities including severe metabolic alkalosis of 7.6 due to high output gastric secretion. Nephrology was consulted.  Acid-base balance has eventually improved Electrolytes being monitored.   Type 2 diabetes mellitus with hyperglycemia A1c 6.6 on 01/23/2022 PTA on metformin, Jardiance, hold off home oral hypoglycemics Currently on sliding scale insulin.   Resolved acute metabolic encephalopathy Mentation is back to baseline.   Impaired mobility/generalized weakness Significant weakness.  Multifactorial: Low-level electrolytes, poor oral intake, dehydration, physical deconditioning. PT OT with fall precautions. CIR evaluation for possible admission to CIR.   H/o Essential HTN lisinopril remains on hold Closely monitor vital signs.   HLD Lipitor remains on hold   Obesity -Body mass index is 39.85 kg/m. Patient has been advised to make an attempt to improve diet and exercise patterns to aid in weight loss.   OSA Baseline CPAP 10 cm H20, followed by Dr. Ander Slade CPAP QSH     Hx of ADHD methylphenidate on hold   Hx of Hypogonadism. Was on outpatient testosterone    Old records reviewed in assessment of this patient   DVT prophylaxis:  Place and maintain sequential compression device Start: 02/08/22 0001 Lovenox  Code Status:   Code Status: Full Code  Mobility Assessment (last 72 hours)     Mobility Assessment   No documentation.           Barriers to discharge:  Disposition Plan:  Possibly CIR Status is: Inpt  Objective: Blood pressure (!) 107/58, pulse (!) 142, temperature (!) 101.1 F (38.4 C), temperature source Axillary, resp. rate (!) 29, height '5\' 9"'$  (1.753 m), weight 120.2 kg, SpO2 96 %.  Examination:  Physical Exam Constitutional:      General:  He is not in acute distress.    Appearance: Normal appearance. He is obese. He is not ill-appearing.  HENT:     Head: Normocephalic and atraumatic.     Mouth/Throat:     Mouth: Mucous membranes are moist.  Eyes:     Extraocular Movements: Extraocular movements intact.  Cardiovascular:     Rate and Rhythm: Normal rate and regular rhythm.  Pulmonary:     Comments: Coarse breath sounds throughout, no wheezing Abdominal:     Comments: Mild distention, obese.  Soft.  Appropriately tender to palpation.  Ostomy bag in place with brown stool noted  Musculoskeletal:        General: Swelling present.     Cervical back: Normal range of motion and neck supple.     Right lower leg: Edema present.     Left lower leg: Edema present.     Comments: Diffuse and generalized edema throughout  Skin:    General: Skin is warm and dry.  Neurological:     General: No focal deficit present.     Mental Status: He is alert.  Psychiatric:        Mood and Affect: Mood normal.        Behavior: Behavior normal.      Consultants:  General surgery  Procedures:    Data Reviewed: Results for orders placed or performed during the hospital encounter of 02/07/22 (from the past 24 hour(s))  Glucose, capillary     Status: Abnormal   Collection Time:  02/24/22  6:07 PM  Result Value Ref Range   Glucose-Capillary 166 (H) 70 - 99 mg/dL  Glucose, capillary     Status: Abnormal   Collection Time: 02/24/22  9:18 PM  Result Value Ref Range   Glucose-Capillary 145 (H) 70 - 99 mg/dL   Comment 1 Notify RN    Comment 2 Document in Chart   CBC with Differential/Platelet     Status: Abnormal   Collection Time: 02/25/22  5:50 AM  Result Value Ref Range   WBC 4.9 4.0 - 10.5 K/uL   RBC 2.44 (L) 4.22 - 5.81 MIL/uL   Hemoglobin 7.7 (L) 13.0 - 17.0 g/dL   HCT 24.9 (L) 39.0 - 52.0 %   MCV 102.0 (H) 80.0 - 100.0 fL   MCH 31.6 26.0 - 34.0 pg   MCHC 30.9 30.0 - 36.0 g/dL   RDW 15.7 (H) 11.5 - 15.5 %   Platelets 254 150 -  400 K/uL   nRBC 0.0 0.0 - 0.2 %   Neutrophils Relative % 76 %   Neutro Abs 3.7 1.7 - 7.7 K/uL   Lymphocytes Relative 11 %   Lymphs Abs 0.5 (L) 0.7 - 4.0 K/uL   Monocytes Relative 8 %   Monocytes Absolute 0.4 0.1 - 1.0 K/uL   Eosinophils Relative 4 %   Eosinophils Absolute 0.2 0.0 - 0.5 K/uL   Basophils Relative 0 %   Basophils Absolute 0.0 0.0 - 0.1 K/uL   WBC Morphology DOHLE BODIES    Immature Granulocytes 1 %   Abs Immature Granulocytes 0.04 0.00 - 0.07 K/uL  Magnesium     Status: Abnormal   Collection Time: 02/25/22  5:50 AM  Result Value Ref Range   Magnesium 1.6 (L) 1.7 - 2.4 mg/dL  Phosphorus     Status: None   Collection Time: 02/25/22  5:50 AM  Result Value Ref Range   Phosphorus 3.3 2.5 - 4.6 mg/dL  Comprehensive metabolic panel     Status: Abnormal   Collection Time: 02/25/22  5:50 AM  Result Value Ref Range   Sodium 138 135 - 145 mmol/L   Potassium 3.8 3.5 - 5.1 mmol/L   Chloride 100 98 - 111 mmol/L   CO2 32 22 - 32 mmol/L   Glucose, Bld 132 (H) 70 - 99 mg/dL   BUN 9 8 - 23 mg/dL   Creatinine, Ser 0.34 (L) 0.61 - 1.24 mg/dL   Calcium 8.0 (L) 8.9 - 10.3 mg/dL   Total Protein 5.9 (L) 6.5 - 8.1 g/dL   Albumin 2.0 (L) 3.5 - 5.0 g/dL   AST 20 15 - 41 U/L   ALT 15 0 - 44 U/L   Alkaline Phosphatase 138 (H) 38 - 126 U/L   Total Bilirubin 0.6 0.3 - 1.2 mg/dL   GFR, Estimated >60 >60 mL/min   Anion gap 6 5 - 15  Glucose, capillary     Status: Abnormal   Collection Time: 02/25/22  8:03 AM  Result Value Ref Range   Glucose-Capillary 144 (H) 70 - 99 mg/dL  Glucose, capillary     Status: Abnormal   Collection Time: 02/25/22 11:59 AM  Result Value Ref Range   Glucose-Capillary 123 (H) 70 - 99 mg/dL   Comment 1 Notify RN    Comment 2 Document in Chart   CBC with Differential/Platelet     Status: Abnormal   Collection Time: 02/25/22 12:01 PM  Result Value Ref Range   WBC 5.9 4.0 - 10.5 K/uL  RBC 2.69 (L) 4.22 - 5.81 MIL/uL   Hemoglobin 8.6 (L) 13.0 - 17.0 g/dL    HCT 27.2 (L) 39.0 - 52.0 %   MCV 101.1 (H) 80.0 - 100.0 fL   MCH 32.0 26.0 - 34.0 pg   MCHC 31.6 30.0 - 36.0 g/dL   RDW 15.5 11.5 - 15.5 %   Platelets 296 150 - 400 K/uL   nRBC 0.3 (H) 0.0 - 0.2 %   Neutrophils Relative % 79 %   Neutro Abs 4.7 1.7 - 7.7 K/uL   Lymphocytes Relative 9 %   Lymphs Abs 0.5 (L) 0.7 - 4.0 K/uL   Monocytes Relative 7 %   Monocytes Absolute 0.4 0.1 - 1.0 K/uL   Eosinophils Relative 4 %   Eosinophils Absolute 0.2 0.0 - 0.5 K/uL   Basophils Relative 0 %   Basophils Absolute 0.0 0.0 - 0.1 K/uL   Immature Granulocytes 1 %   Abs Immature Granulocytes 0.03 0.00 - 0.07 K/uL  Hemoglobin and hematocrit, blood     Status: Abnormal   Collection Time: 02/25/22  1:29 PM  Result Value Ref Range   Hemoglobin 8.1 (L) 13.0 - 17.0 g/dL   HCT 25.8 (L) 39.0 - 52.0 %  Prepare RBC (crossmatch)     Status: None   Collection Time: 02/25/22  2:02 PM  Result Value Ref Range   Order Confirmation      ORDER PROCESSED BY BLOOD BANK Performed at [facility], [place] [address], [place], [place] [address]   Prepare RBC (crossmatch)     Status: None   Collection Time: 02/25/22  2:35 PM  Result Value Ref Range   Order Confirmation      ORDER PROCESSED BY BLOOD BANK Performed at [facility], [place] [address] [place], [place] [address]     I have Reviewed nursing notes, Vitals, and Lab results since pt's last encounter. Pertinent lab results : see above I have ordered test including BMP, CBC, Mg I have reviewed the last note from staff over past 24 hours I have discussed pt's care plan and test results with nursing staff, case manager  Time spent: Greater than 50% of the 55 minute visit was spent in counseling/coordination of care for the patient as laid out in the A&P.    LOS: 17 days   Dwyane Dee, MD [facility] 02/25/2022, 3:51 PM

## 2022-02-25 NOTE — Progress Notes (Signed)
PT Cancellation Note  Patient Details Name: Richard [name] MRN: [id number] DOB: [birth year]   Cancelled Treatment:    Reason Eval/Treat Not Completed: Medical issues which prohibited therapy Attempted x  3,  last attempt, blood noted in bed, RN notified. Will check back another time when stablee medically. [place] Office [phone] Weekend pager-[phone]   Claretha Cooper 02/25/2022, 12:05 PM

## 2022-02-25 NOTE — Progress Notes (Signed)
Patient ID: Richard [name], male   DOB: 10/28/1955, 66 y.o.   MRN: [id number] Irven [place] rectum twice now with tachycardia.  Will stop lovenox, recheck hb now.

## 2022-02-25 NOTE — Interval H&P Note (Signed)
History and Physical Interval Note:  02/25/2022 5:32 PM  Richard [name]  has presented today for surgery, with the diagnosis of Rectal bleeding, Positive Venous bleed.  The various methods of treatment have been discussed with the patient and family. After consideration of risks, benefits and other options for treatment, the patient has consented to  Procedure(s): FLEXIBLE SIGMOIDOSCOPY (N/A) as a surgical intervention.  The patient's history has been reviewed, patient examined, no change in status, stable for surgery.  I have reviewed the patient's chart and labs.  Questions were answered to the patient's satisfaction.     [employer]

## 2022-02-26 ENCOUNTER — Inpatient Hospital Stay (HOSPITAL_COMMUNITY): Payer: 59

## 2022-02-26 DIAGNOSIS — A419 Sepsis, unspecified organism: Secondary | ICD-10-CM | POA: Diagnosis not present

## 2022-02-26 DIAGNOSIS — K651 Peritoneal abscess: Secondary | ICD-10-CM | POA: Diagnosis not present

## 2022-02-26 DIAGNOSIS — D62 Acute posthemorrhagic anemia: Secondary | ICD-10-CM | POA: Diagnosis not present

## 2022-02-26 DIAGNOSIS — K50114 Crohn's disease of large intestine with abscess: Secondary | ICD-10-CM | POA: Diagnosis not present

## 2022-02-26 DIAGNOSIS — K631 Perforation of intestine (nontraumatic): Secondary | ICD-10-CM | POA: Diagnosis not present

## 2022-02-26 DIAGNOSIS — Z9049 Acquired absence of other specified parts of digestive tract: Secondary | ICD-10-CM | POA: Diagnosis not present

## 2022-02-26 DIAGNOSIS — K626 Ulcer of anus and rectum: Secondary | ICD-10-CM

## 2022-02-26 LAB — COMPREHENSIVE METABOLIC PANEL
ALT: 11 U/L (ref 0–44)
AST: 15 U/L (ref 15–41)
Albumin: 2.4 g/dL — ABNORMAL LOW (ref 3.5–5.0)
Alkaline Phosphatase: 84 U/L (ref 38–126)
Anion gap: 4 — ABNORMAL LOW (ref 5–15)
BUN: 11 mg/dL (ref 8–23)
CO2: 34 mmol/L — ABNORMAL HIGH (ref 22–32)
Calcium: 7.8 mg/dL — ABNORMAL LOW (ref 8.9–10.3)
Chloride: 102 mmol/L (ref 98–111)
Creatinine, Ser: <0.3 mg/dL — ABNORMAL LOW (ref 0.61–1.24)
Glucose, Bld: 115 mg/dL — ABNORMAL HIGH (ref 70–99)
Potassium: 3.5 mmol/L (ref 3.5–5.1)
Sodium: 140 mmol/L (ref 135–145)
Total Bilirubin: 0.7 mg/dL (ref 0.3–1.2)
Total Protein: 5.4 g/dL — ABNORMAL LOW (ref 6.5–8.1)

## 2022-02-26 LAB — CBC WITH DIFFERENTIAL/PLATELET
Abs Immature Granulocytes: 0.02 10*3/uL (ref 0.00–0.07)
Basophils Absolute: 0 10*3/uL (ref 0.0–0.1)
Basophils Relative: 1 %
Eosinophils Absolute: 0.2 10*3/uL (ref 0.0–0.5)
Eosinophils Relative: 4 %
HCT: 25.1 % — ABNORMAL LOW (ref 39.0–52.0)
Hemoglobin: 8.1 g/dL — ABNORMAL LOW (ref 13.0–17.0)
Immature Granulocytes: 0 %
Lymphocytes Relative: 10 %
Lymphs Abs: 0.5 10*3/uL — ABNORMAL LOW (ref 0.7–4.0)
MCH: 30.8 pg (ref 26.0–34.0)
MCHC: 32.3 g/dL (ref 30.0–36.0)
MCV: 95.4 fL (ref 80.0–100.0)
Monocytes Absolute: 0.4 10*3/uL (ref 0.1–1.0)
Monocytes Relative: 8 %
Neutro Abs: 3.7 10*3/uL (ref 1.7–7.7)
Neutrophils Relative %: 77 %
Platelets: 205 10*3/uL (ref 150–400)
RBC: 2.63 MIL/uL — ABNORMAL LOW (ref 4.22–5.81)
RDW: 16.4 % — ABNORMAL HIGH (ref 11.5–15.5)
WBC: 4.8 10*3/uL (ref 4.0–10.5)
nRBC: 0 % (ref 0.0–0.2)

## 2022-02-26 LAB — GLUCOSE, CAPILLARY
Glucose-Capillary: 118 mg/dL — ABNORMAL HIGH (ref 70–99)
Glucose-Capillary: 102 mg/dL — ABNORMAL HIGH (ref 70–99)
Glucose-Capillary: 150 mg/dL — ABNORMAL HIGH (ref 70–99)
Glucose-Capillary: 174 mg/dL — ABNORMAL HIGH (ref 70–99)

## 2022-02-26 LAB — HEMOGLOBIN AND HEMATOCRIT, BLOOD
HCT: 26 % — ABNORMAL LOW (ref 39.0–52.0)
HCT: 25.6 % — ABNORMAL LOW (ref 39.0–52.0)
Hemoglobin: 8.4 g/dL — ABNORMAL LOW (ref 13.0–17.0)
Hemoglobin: 8.3 g/dL — ABNORMAL LOW (ref 13.0–17.0)

## 2022-02-26 LAB — MAGNESIUM: Magnesium: 1.8 mg/dL (ref 1.7–2.4)

## 2022-02-26 LAB — PROTIME-INR
INR: 1.3 — ABNORMAL HIGH (ref 0.8–1.2)
Prothrombin Time: 16.3 seconds — ABNORMAL HIGH (ref 11.4–15.2)

## 2022-02-26 LAB — PREPARE RBC (CROSSMATCH)

## 2022-02-26 LAB — PHOSPHORUS: Phosphorus: 3.3 mg/dL (ref 2.5–4.6)

## 2022-02-26 MED ORDER — MAGNESIUM SULFATE 2 GM/50ML IV SOLN
2.0000 g | Freq: Once | INTRAVENOUS | Status: AC
  Administered 2022-02-26: 2 g via INTRAVENOUS
  Filled 2022-02-26: qty 50

## 2022-02-26 MED ORDER — MIDAZOLAM HCL 2 MG/2ML IJ SOLN
INTRAMUSCULAR | Status: AC
  Filled 2022-02-26: qty 2

## 2022-02-26 MED ORDER — FENTANYL CITRATE (PF) 100 MCG/2ML IJ SOLN
INTRAMUSCULAR | Status: AC | PRN
  Administered 2022-02-26: 50 ug via INTRAVENOUS
  Administered 2022-02-26: 25 ug via INTRAVENOUS

## 2022-02-26 MED ORDER — SODIUM CHLORIDE 0.9% FLUSH
5.0000 mL | Freq: Three times a day (TID) | INTRAVENOUS | Status: DC
  Administered 2022-02-26 – 2022-03-01 (×8): 5 mL

## 2022-02-26 MED ORDER — FENTANYL CITRATE (PF) 100 MCG/2ML IJ SOLN
INTRAMUSCULAR | Status: AC
  Filled 2022-02-26: qty 2

## 2022-02-26 MED ORDER — POTASSIUM CHLORIDE CRYS ER 20 MEQ PO TBCR
40.0000 meq | EXTENDED_RELEASE_TABLET | Freq: Once | ORAL | Status: AC
  Administered 2022-02-26: 40 meq via ORAL
  Filled 2022-02-26: qty 2

## 2022-02-26 MED ORDER — MIDAZOLAM HCL 2 MG/2ML IJ SOLN
INTRAMUSCULAR | Status: AC | PRN
  Administered 2022-02-26: 1 mg via INTRAVENOUS

## 2022-02-26 MED ORDER — SODIUM CHLORIDE 0.9% IV SOLUTION: Freq: Once | INTRAVENOUS | Status: DC

## 2022-02-26 NOTE — Progress Notes (Signed)
Progress Note  1 Day Post-Op  Subjective: Discussed GI findings and CT findings with patient and his wife this AM. Having ostomy output. Patient seems very overwhelmed, understandably.   Objective: Vital signs in last 24 hours: Temp:  [97.4 F (36.3 C)-102.1 F (38.9 C)] 99.4 F (37.4 C) (12/12 0806) Pulse Rate:  [83-146] 83 (12/12 0530) Resp:  [16-31] 18 (12/12 0530) BP: (77-143)/(42-73) 116/65 (12/12 0530) SpO2:  [88 %-100 %] 97 % (12/12 0530) Weight:  [118.4 kg] 118.4 kg (12/12 0500) Last BM Date : 02/25/22  Intake/Output from previous day: 12/11 0701 - 12/12 0700 In: 2261 [P.O.:120; I.V.:19; Blood:1023.8; IV Piggyback:1098.2] Out: 2353 [Urine:2475; Stool:680] Intake/Output this shift: No intake/output data recorded.  PE: General: pleasant, WD, obese male who is laying in bed in NAD Heart: regular, rate, and rhythm.   Lungs: Respiratory effort nonlabored Abd: soft, NT, mild distention, ostomy productive of stool, some ulceration on stoma but mostly viable and stoma is patent, midline wound clean with small amount fibrinous exudate centrally  MS: edema of LUE    Lab Results:  Recent Labs    02/25/22 1201 02/25/22 1329 02/25/22 1827 02/26/22 0521  WBC 5.9  --   --  4.8  HGB 8.6*   < > 6.1* 8.1*  HCT 27.2*   < > 19.1* 25.1*  PLT 296  --   --  205   < > = values in this interval not displayed.   BMET Recent Labs    02/25/22 0550 02/26/22 0521  NA 138 140  K 3.8 3.5  CL 100 102  CO2 32 34*  GLUCOSE 132* 115*  BUN 9 11  CREATININE 0.34* <0.30*  CALCIUM 8.0* 7.8*   PT/INR No results for input(s): "LABPROT", "INR" in the last 72 hours. CMP     Component Value Date/Time   NA 140 02/26/2022 0521   K 3.5 02/26/2022 0521   CL 102 02/26/2022 0521   CO2 34 (H) 02/26/2022 0521   GLUCOSE 115 (H) 02/26/2022 0521   BUN 11 02/26/2022 0521   CREATININE <0.30 (L) 02/26/2022 0521   CREATININE 0.63 (L) 03/10/2019 1436   CALCIUM 7.8 (L) 02/26/2022 0521    PROT 5.4 (L) 02/26/2022 0521   ALBUMIN 2.4 (L) 02/26/2022 0521   AST 15 02/26/2022 0521   ALT 11 02/26/2022 0521   ALKPHOS 84 02/26/2022 0521   BILITOT 0.7 02/26/2022 0521   GFRNONAA NOT CALCULATED 02/26/2022 0521   Lipase  No results found for: "LIPASE"     Studies/Results: CT ANGIO GI BLEED  Result Date: 02/25/2022 CLINICAL DATA:  Lower GI bleed (Ped 0-17y) Acute significant rectal bleeding past hour - he is s/p sigmoid resection and colostomy for perforation a couple weeks ago EXAM: CTA ABDOMEN AND PELVIS WITHOUT AND WITH CONTRAST TECHNIQUE: Multidetector CT imaging of the abdomen and pelvis was performed using the standard protocol during bolus administration of intravenous contrast. Multiplanar reconstructed images and MIPs were obtained and reviewed to evaluate the vascular anatomy. RADIATION DOSE REDUCTION: This exam was performed according to the departmental dose-optimization program which includes automated exposure control, adjustment of the mA and/or kV according to patient size and/or use of iterative reconstruction technique. CONTRAST:  127m OMNIPAQUE IOHEXOL 350 MG/ML SOLN COMPARISON:  CT AP, 02/19/2022 and 02/15/2022. FINDINGS: Suboptimal evaluation, secondary to motion degradation. VASCULAR Aorta: Normal caliber aorta without aneurysm, dissection, vasculitis or significant stenosis. Celiac: Patent without evidence of aneurysm, dissection, vasculitis or significant stenosis. SMA: Patent without evidence of aneurysm, dissection,  vasculitis or significant stenosis. Renals: Both renal arteries are patent without evidence of aneurysm, dissection, vasculitis, fibromuscular dysplasia or significant stenosis. IMA: Patent without evidence of aneurysm, dissection, vasculitis or significant stenosis. Pelvis: Patent without evidence of aneurysm, dissection, vasculitis or significant stenosis. Proximal Outflow: Bilateral common femoral and visualized portions of the superficial and profunda  femoral arteries are patent without evidence of aneurysm, dissection, vasculitis or significant stenosis. Veins: Portosystemic collateral arising off the SMV with rectal varix, and active extravasation within the rectum. Review of the MIP images confirms the above findings. NON-VASCULAR Lower chest: Small volume bilateral pleural effusions with adjacent dependent compressive atelectasis. Hepatobiliary: Hepatomegaly, with the RIGHT hepatic measuring 22 cm. No focal liver abnormality is seen. Contacted gallbladder with trace pericholecystic ascites. No gallstones, gallbladder wall thickening, or biliary dilatation. Pancreas: No pancreatic ductal dilatation or surrounding inflammatory changes. Spleen: Normal in size without focal abnormality. Adrenals/Urinary Tract: Adrenal glands are unremarkable. Kidneys are normal, without renal calculi, focal lesion, or hydronephrosis. Bladder is unremarkable. Stomach/Bowel: Stomach is within normal limits. Postsurgical changes of LEFT hemicolectomy with end colostomy at the LEFT lower quadrant. Mildly edematous loops of bowel at the LEFT lower quadrant, with the staple line difficult to assess. Rectal variceal hemorrhage, as described above. Rectosigmoid distention of stool, with additional layering hemorrhage. Lymphatic: No enlarged abdominal or pelvic lymph nodes. Reproductive: Prostate is unremarkable. Other: Small fat containing parastomal hernia. Moderate volume intra-abdominal ascites, is seen within the perihepatic and perisplenic spaces, polyclonal gutters and layering within the RIGHT lower quadrant. Additional air-and-fluid containing, rim-enhancing fluid collection at the LEFT lower quadrant measures 6.7 x 7.3 x 9.5 cm (AP by transverse by CC), consistent with early abscess. Musculoskeletal: Obese with [place] abdomen. Moderate-to-moderate body wall edema, greatest within the flanks. No acute or significant osseous findings. IMPRESSION: VASCULAR 1. Examination is  POSITIVE for acute extravasation within the rectum, with target vessel originating from a branch of the SMV consistent with venous hemorrhage. NON-VASCULAR 1. Postsurgical changes of hemicolectomy with colostomy at the LEFT lower quadrant. Small fat-containing containing parastomal hernia without evidence of obstruction. 2. Edematous loops of bowel in the LEFT lower quadrant, about the staple line, with adjacent 10 cm a-and-fluid containing, rim enhancing fluid collection consistent with early abscess. 3.  Additional incidental, chronic and senescent findings as above. These results were called by telephone at the time of interpretation on 02/25/2022 at 4:20 pm to provider Dr Rush Landmark, who verbally acknowledged these results. Michaelle Birks, MD [facility] Radiology Electronically Signed   By: Michaelle Birks M.D.   On: 02/25/2022 16:50   VAS [place] UPPER EXTREMITY VENOUS DUPLEX  Result Date: 02/25/2022 UPPER VENOUS STUDY  Patient Name:  OUMAR MARCOTT  Date of Exam:   02/25/2022 Medical Rec #: [id number]       Accession #:    [id number] Date of Birth: 1956-02-25       Patient Gender: M Patient Age:   66 years Exam Location:  [facility] Procedure:      VAS [place] UPPER EXTREMITY VENOUS DUPLEX Referring Phys: Dwyane Dee --------------------------------------------------------------------------------  Indications: Edema Other Indications: IV to LUE [facility]). Limitations: Body habitus and poor ultrasound/tissue interface. Comparison Study: No previous exams Performing Technologist: Jody Hill RVT, RDMS  Examination Guidelines: A complete evaluation includes B-mode imaging, spectral Doppler, color Doppler, and power Doppler as needed of all accessible portions of each vessel. Bilateral testing is considered an integral part of a complete examination. Limited examinations for reoccurring indications may be  performed as noted.  Right Findings:  +----------+------------+---------+-----------+----------+--------------------+ RIGHT     CompressiblePhasicitySpontaneousProperties      Summary        +----------+------------+---------+-----------+----------+--------------------+ Subclavian               Yes       Yes                   patent by                                                              color/doppler     +----------+------------+---------+-----------+----------+--------------------+  Left Findings: +----------+------------+---------+-----------+----------+--------------------+ LEFT      CompressiblePhasicitySpontaneousProperties      Summary        +----------+------------+---------+-----------+----------+--------------------+ IJV           Full       Yes       Yes                                   +----------+------------+---------+-----------+----------+--------------------+ Subclavian    Full       Yes       Yes                                   +----------+------------+---------+-----------+----------+--------------------+ Axillary      Full       Yes       Yes                                   +----------+------------+---------+-----------+----------+--------------------+ Brachial      Full       Yes       Yes                                   +----------+------------+---------+-----------+----------+--------------------+ Radial                                              patent by color only +----------+------------+---------+-----------+----------+--------------------+ Ulnar                                               patent by color only +----------+------------+---------+-----------+----------+--------------------+ Cephalic      Full                                                       +----------+------------+---------+-----------+----------+--------------------+ Basilic       Full       Yes       Yes                                    +----------+------------+---------+-----------+----------+--------------------+  Ulnar and radial veins not well visualized due to overlying edema. Patent by color only.  Summary:  Right: No evidence of thrombosis in the subclavian.  Left: Difficult exam due to poor ultrasound/ tissue interface and diffuse subcutaneous edema of forearm. No obvious evidence of deep vein thrombosis in the upper extremity in areas visualized. No obvious evidence of superficial vein thrombosis in the upper extremity in areas visualized.  *See table(s) above for measurements and observations.  Diagnosing physician: Monica Martinez MD Electronically signed by Monica Martinez MD on 02/25/2022 at 1:53:04 PM.    Final     Anti-infectives: Anti-infectives (From admission, onward)    Start     Dose/Rate Route Frequency Ordered Stop   02/25/22 2000  piperacillin-tazobactam (ZOSYN) IVPB 3.375 g        3.375 g 12.5 mL/hr over 240 Minutes Intravenous Every 8 hours 02/25/22 1928     02/15/22 1400  meropenem (MERREM) 1 g in sodium chloride 0.9 % 100 mL IVPB  Status:  Discontinued        1 g 200 mL/hr over 30 Minutes Intravenous Every 8 hours 02/15/22 1244 02/19/22 1709   02/15/22 1400  vancomycin (VANCOCIN) IVPB 1000 mg/200 mL premix  Status:  Discontinued        1,000 mg 200 mL/hr over 60 Minutes Intravenous Every 12 hours 02/15/22 1304 02/18/22 1445   02/08/22 1200  piperacillin-tazobactam (ZOSYN) IVPB 3.375 g  Status:  Discontinued        3.375 g 12.5 mL/hr over 240 Minutes Intravenous Every 8 hours 02/08/22 0505 02/15/22 1243   02/08/22 0500  piperacillin-tazobactam (ZOSYN) IVPB 3.375 g  Status:  Discontinued        3.375 g 12.5 mL/hr over 240 Minutes Intravenous Every 8 hours 02/08/22 0006 02/08/22 0505   02/08/22 0334  piperacillin-tazobactam (ZOSYN) 3.375 GM/50ML IVPB       Note to Pharmacy: Enis Gash W: cabinet override      02/08/22 0334 02/08/22 0345   02/07/22 2030  ceFEPIme (MAXIPIME) 2 g in sodium  chloride 0.9 % 100 mL IVPB        2 g 200 mL/hr over 30 Minutes Intravenous  Once 02/07/22 2023 02/07/22 2248   02/07/22 2030  metroNIDAZOLE (FLAGYL) IVPB 500 mg        500 mg 100 mL/hr over 60 Minutes Intravenous  Once 02/07/22 2023 02/08/22 0506   02/07/22 2030  vancomycin (VANCOCIN) IVPB 1000 mg/200 mL premix        1,000 mg 200 mL/hr over 60 Minutes Intravenous  Once 02/07/22 2023 02/08/22 0506        Assessment/Plan  Sigmoid perforation  POD19 S/P exploratory laparotomy, colon resection, end colostomy 02/07/22 Dr. [name] - ok to have diet from surgical standpoint  - having ostomy output (680 cc/24h) - on bid fibercon - CT 12/1 with some free fluid but no organized collection, CT 12/5 with no major changes - BRBPR yesterday - s/p flex-sig which showed bleeding ulcerations. CTA also showed LLQ abscess - hgb stable this AM, s/p 4 units PRBC yesterday - IR consult today for percutaneous drainage - ultimately needs medical management for Crohn's otherwise likely to continue to have rectal bleeding, hopefully with source control of infection would be able to initiate steroids as well for acute flare   FEN: NPO this AM VTE: LMWH on hold  ID: vanc/flagyl/cefepime 11/23; zosyn 11/24>11/29; vanc 12/1>12/4; merrem 12/1> 12/5; Zosyn 12/11>>   - below per TRH -  Post-operative respiratory insufficiency T2DM  Physical deconditioning  Acute metabolic encephalopathy, improving  HTN HLD Obesity class I OSA Hypogonadism Moderate protein calorie malnutrition  ADHD/Anxiety    LOS: 18 days     Norm Parcel, [facility] Surgery 02/26/2022, 8:22 AM Please see Amion for pager number during day hours 7:00am-4:30pm

## 2022-02-26 NOTE — Progress Notes (Signed)
Inpatient Rehab Admissions Coordinator:   CIR following for potential admit. Not yet medically ready but will follow up once stable and able to participate with therapies.   Clemens Catholic, [place], [place] Admissions Coordinator  [phone] ([place]) [phone] (office)

## 2022-02-26 NOTE — Consult Note (Signed)
Chief Complaint: Patient was seen in consultation today for image guided aspiration/drainage of left abdominal fluid collection and paracentesis   Chief Complaint  Patient presents with   Fall   Respiratory Distress    Referring Physician(s): Sandria Senter PA-C  Supervising Physician: Mir, Sharen Heck  Patient Status: [facility] - In-pt  History of Present Illness: Richard [name] is a 66 y.o. male with past medical history of ADHD, anxiety, colon polyps, hyperlipidemia, hypertension, obesity, sleep apnea on CPAP, diabetes, secondary male hypogonadism,  peripheral neuropathy admitted to [facility] on 11/23 with abdominal pain, fever, recent syncopal episode.  CT at that time showed perforated bowel with moderate free fluid and free air.  On 11/24 patient underwent exploratory laparotomy with descending and sigmoid colon resection and transverse colostomy.  Surgical path showed transmural inflammation consistent with IBD.  He has had some recent bright red blood per rectum . He underwent flex sig which showed bleeding ulcerations ( hemostatic gel used) and CTA on 12/11 which revealed:  VASCULAR   1. Examination is POSITIVE for acute extravasation within the rectum, with target vessel originating from a branch of the SMV consistent with venous hemorrhage.   NON-VASCULAR   1. Postsurgical changes of hemicolectomy with colostomy at the LEFT lower quadrant. Small fat-containing containing parastomal hernia without evidence of obstruction. 2. Edematous loops of bowel in the LEFT lower quadrant, about the staple line, with adjacent 10 cm a-and-fluid containing, rim enhancing fluid collection consistent with early abscess  Temp currently 99, WBC 4.8, hgb 8.1, plts nl, PT 16.3, INR 1.3, creat < .30, latest blood cx neg; request now received from surgery for image guided left abdominal fluid collection drainage    Past Medical History:  Diagnosis Date   Adult ADHD    Allergic rhinitis     Anxiety    claustrophobic   Bronchopneumonia 11/03/2013   Colon polyps 2012 and 2013   All hyperplastic except one adenomatous w/out high grade dysplasia (recall 2023 per Dr. Deatra Ina)   COVID-19 virus infection 09/29/2020   Elevated transaminase level 2017   Viral hep screening neg.  Suspect fatty liver.   Hyperlipidemia    Hypertension    Obesity    OSA on CPAP    Severe.  CPAP 10 cm H2O.  Follows Dr. Ander Slade.   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 [name] to do surg   Pneumonia    Seborrheic dermatitis of scalp    and face: hydrocort 2% cream bid prn to face, and fluocinonide 0.5% sol'n to scalp bid prn per Dr. Allyson Sabal (04/2014)   Secondary male hypogonadism 04/2021   Type 2 diabetes mellitus with microalbuminuria ([place]) DM dx-09/27/2015. Microalb 2020    Past Surgical History:  Procedure Laterality Date   COLONOSCOPY W/ POLYPECTOMY  04/2010; 10/2011   Initial screening TCS showed 6 polyps (one adenomatous).  Pt says he returned for repeat TCS 10/2011 showed one hyperplastic polyp: Recall 10 yrs per Dr. Deatra Ina   INCISION AND DRAINAGE ABSCESS N/A 11/07/2021   Procedure: Incision and drainage of scrotal abscess;  Surgeon: Leighton Ruff, MD;  Location: [facility];  Service: General;  Laterality: N/A;   LAPAROTOMY N/A 02/07/2022   Procedure: EXPLORATORY LAPAROTOMY, COLON RESECTION, OSTOMY;  Surgeon: Leighton Ruff, MD;  Location: [facility];  Service: General;  Laterality: N/A;   MOUTH SURGERY      Allergies: Lobster [shellfish allergy] and Poison ivy extract  Medications: Prior to Admission medications   Medication Sig Start Date End Date Taking?  Authorizing Provider  acetaminophen (TYLENOL) 500 MG tablet Take 1,000 mg by mouth every 6 (six) hours as needed for mild pain.   Yes [provider]  atorvastatin (LIPITOR) 40 MG tablet TAKE 1 TABLET BY MOUTH EVERY DAY 02/01/22  Yes McGowen, Adrian Blackwater, MD  clotrimazole-betamethasone (LOTRISONE) cream Apply 1  Application topically 2 (two) times daily. 01/23/22  Yes McGowen, Adrian Blackwater, MD  diphenhydrAMINE (BENADRYL) 25 mg capsule Take 25 mg by mouth daily as needed for allergies.   Yes [provider]  diphenoxylate-atropine (LOMOTIL) 2.5-0.025 MG tablet Take 1-2 tablets by mouth 4 (four) times daily as needed for diarrhea or loose stools. 1-2 tabs po qid prn diarrhea 01/12/22  Yes Pyrtle, Lajuan Lines, MD  empagliflozin (JARDIANCE) 10 MG TABS tablet TAKE 1 TABLET BY MOUTH EVERY DAY BEFORE BREAKFAST Patient taking differently: Take 10 mg by mouth daily. 02/05/22  Yes McGowen, Adrian Blackwater, MD  lisinopril (ZESTRIL) 10 MG tablet TAKE 1 TABLET DAILY 01/10/22  Yes McGowen, Adrian Blackwater, MD  metFORMIN (GLUCOPHAGE) 500 MG tablet TAKE 1 TABLET BY MOUTH TWICE A DAY WITH FOOD 02/01/22  Yes McGowen, Adrian Blackwater, MD  methylphenidate (METADATE CD) 20 MG CR capsule Take 1 capsule (20 mg total) by mouth daily as needed. Patient taking differently: Take 20 mg by mouth daily as needed (for focuswhile at work). 01/16/22  Yes McGowen, Adrian Blackwater, MD  Accu-Chek Softclix Lancets lancets SMARTSIG:Topical 01/19/20   [provider]  glucose blood (ACCU-CHEK GUIDE) test strip USE TO TEST BLOOD SUGAR TWICE DAILY 07/12/21   McGowen, Adrian Blackwater, MD  SYRINGE-NEEDLE, DISP, 3 ML 22G X 1-1/2" 3 ML MISC USE TO ADMINISTER TESTOSTERONE INJECTION EVERY 2 WEEKS 06/06/21   McGowen, Adrian Blackwater, MD  Syringe/Needle, Disp, 18G X 1" 3 ML MISC USE TO ADMINISTER TESTOSTERONE INJECTION EVERY 2 WEEKS 06/06/21   McGowen, Adrian Blackwater, MD  testosterone cypionate (DEPOTESTOSTERONE CYPIONATE) 200 MG/ML injection 200 MG (1 ML) SQ Q 2 WEEKS Patient not taking: Reported on 02/08/2022 12/12/21   McGowen, Adrian Blackwater, MD     Family History  Problem Relation Age of Onset   Lung cancer Father    Cancer Father        nasal   Allergies Father    Colon cancer Neg Hx    Esophageal cancer Neg Hx    Rectal cancer Neg Hx    Stomach cancer Neg Hx     Social History    Socioeconomic History   Marital status: Married    Spouse name: Not on file   Number of children: Not on file   Years of education: Not on file   Highest education level: Not on file  Occupational History   Occupation: Counselling psychologist  Tobacco Use   Smoking status: Former    Packs/day: 1.00    Types: Cigarettes    Quit date: 07/17/2002    Years since quitting: 19.6   Smokeless tobacco: Never  Vaping Use   Vaping Use: Never used  Substance and Sexual Activity   Alcohol use: Yes    Alcohol/week: 7.0 standard drinks of alcohol    Types: 7 Standard drinks or equivalent per week    Comment: wine every night with dinner   Drug use: No   Sexual activity: Yes    Birth control/protection: None  Other Topics Concern   Not on file  Social History Narrative   Married, 1 child--grown.   Occupation: Financial planner"   Tobacco: 20 pack-yr hx; quit 2004.  Alcohol: nightly cocktail   Drugs: none   Exercise: "walk some when I can".   Diet: normal.   Right Handed. Used to be left handed   Three story home   Drinks a lot of tea and occasional coffee   Social Determinants of Health   Financial Resource Strain: Unknown (10/11/2021)   Overall Financial Resource Strain (CARDIA)    Difficulty of Paying Living Expenses: Patient refused  Food Insecurity: No Food Insecurity (02/08/2022)   Hunger Vital Sign    Worried About Running Out of Food in the Last Year: Never true    Ran Out of Food in the Last Year: Never true  Transportation Needs: No Transportation Needs (02/08/2022)   PRAPARE - Hydrologist (Medical): No    Lack of Transportation (Non-Medical): No  Physical Activity: Unknown (10/11/2021)   Exercise Vital Sign    Days of Exercise per Week: Patient refused    Minutes of Exercise per Session: Not on file  Stress: No Stress Concern Present (10/11/2021)   [place]    Feeling of  Stress : Not at all  Social Connections: Unknown (10/11/2021)   Social Connection and Isolation Panel [NHANES]    Frequency of Communication with Friends and Family: Patient refused    Frequency of Social Gatherings with Friends and Family: Patient refused    Attends Religious Services: Patient refused    Marine scientist or Organizations: Patient refused    Attends Archivist Meetings: Not on file    Marital Status: Patient refused      Review of Systems currently denies high fever, chest pain, worsening dyspnea, back pain, nausea, vomiting.  He does have a mild headache, abdominal discomfort  Vital Signs: BP 116/65   Pulse (!) 107   Temp 99.4 F (37.4 C) (Oral)   Resp (!) 28   Ht '5\' 9"'$  (1.753 m)   Wt 261 lb 0.4 oz (118.4 kg)   SpO2 94%   BMI 38.55 kg/m      Physical Exam patient awake, slightly drowsy.  Wife in room.  Chest with slightly diminished breath sounds bases.  Heart with tachycardic but regular rhythm.  Abdomen soft, mildly distended, some left-sided tenderness noted to palpation, ostomy intact, midline wound noted with bandage  Imaging: CT ANGIO GI BLEED  Result Date: 02/25/2022 CLINICAL DATA:  Lower GI bleed (Ped 0-17y) Acute significant rectal bleeding past hour - he is s/p sigmoid resection and colostomy for perforation a couple weeks ago EXAM: CTA ABDOMEN AND PELVIS WITHOUT AND WITH CONTRAST TECHNIQUE: Multidetector CT imaging of the abdomen and pelvis was performed using the standard protocol during bolus administration of intravenous contrast. Multiplanar reconstructed images and MIPs were obtained and reviewed to evaluate the vascular anatomy. RADIATION DOSE REDUCTION: This exam was performed according to the departmental dose-optimization program which includes automated exposure control, adjustment of the mA and/or kV according to patient size and/or use of iterative reconstruction technique. CONTRAST:  126m OMNIPAQUE IOHEXOL 350 MG/ML SOLN  COMPARISON:  CT AP, 02/19/2022 and 02/15/2022. FINDINGS: Suboptimal evaluation, secondary to motion degradation. VASCULAR Aorta: Normal caliber aorta without aneurysm, dissection, vasculitis or significant stenosis. Celiac: Patent without evidence of aneurysm, dissection, vasculitis or significant stenosis. SMA: Patent without evidence of aneurysm, dissection, vasculitis or significant stenosis. Renals: Both renal arteries are patent without evidence of aneurysm, dissection, vasculitis, fibromuscular dysplasia or significant stenosis. IMA: Patent without evidence of aneurysm, dissection, vasculitis or  significant stenosis. Pelvis: Patent without evidence of aneurysm, dissection, vasculitis or significant stenosis. Proximal Outflow: Bilateral common femoral and visualized portions of the superficial and profunda femoral arteries are patent without evidence of aneurysm, dissection, vasculitis or significant stenosis. Veins: Portosystemic collateral arising off the SMV with rectal varix, and active extravasation within the rectum. Review of the MIP images confirms the above findings. NON-VASCULAR Lower chest: Small volume bilateral pleural effusions with adjacent dependent compressive atelectasis. Hepatobiliary: Hepatomegaly, with the RIGHT hepatic measuring 22 cm. No focal liver abnormality is seen. Contacted gallbladder with trace pericholecystic ascites. No gallstones, gallbladder wall thickening, or biliary dilatation. Pancreas: No pancreatic ductal dilatation or surrounding inflammatory changes. Spleen: Normal in size without focal abnormality. Adrenals/Urinary Tract: Adrenal glands are unremarkable. Kidneys are normal, without renal calculi, focal lesion, or hydronephrosis. Bladder is unremarkable. Stomach/Bowel: Stomach is within normal limits. Postsurgical changes of LEFT hemicolectomy with end colostomy at the LEFT lower quadrant. Mildly edematous loops of bowel at the LEFT lower quadrant, with the staple line  difficult to assess. Rectal variceal hemorrhage, as described above. Rectosigmoid distention of stool, with additional layering hemorrhage. Lymphatic: No enlarged abdominal or pelvic lymph nodes. Reproductive: Prostate is unremarkable. Other: Small fat containing parastomal hernia. Moderate volume intra-abdominal ascites, is seen within the perihepatic and perisplenic spaces, polyclonal gutters and layering within the RIGHT lower quadrant. Additional air-and-fluid containing, rim-enhancing fluid collection at the LEFT lower quadrant measures 6.7 x 7.3 x 9.5 cm (AP by transverse by CC), consistent with early abscess. Musculoskeletal: Obese with [place] abdomen. Moderate-to-moderate body wall edema, greatest within the flanks. No acute or significant osseous findings. IMPRESSION: VASCULAR 1. Examination is POSITIVE for acute extravasation within the rectum, with target vessel originating from a branch of the SMV consistent with venous hemorrhage. NON-VASCULAR 1. Postsurgical changes of hemicolectomy with colostomy at the LEFT lower quadrant. Small fat-containing containing parastomal hernia without evidence of obstruction. 2. Edematous loops of bowel in the LEFT lower quadrant, about the staple line, with adjacent 10 cm a-and-fluid containing, rim enhancing fluid collection consistent with early abscess. 3.  Additional incidental, chronic and senescent findings as above. These results were called by telephone at the time of interpretation on 02/25/2022 at 4:20 pm to provider Dr Rush Landmark, who verbally acknowledged these results. Michaelle Birks, MD [facility] Electronically Signed   By: Michaelle Birks M.D.   On: 02/25/2022 16:50   VAS [place] UPPER EXTREMITY VENOUS DUPLEX  Result Date: 02/25/2022 UPPER VENOUS STUDY  Patient Name:  RIGEL FILSINGER  Date of Exam:   02/25/2022 Medical Rec #: [id number]       Accession #:    [id number] Date of Birth: 1955-07-20        Patient Gender: M Patient Age:   45 years Exam Location:  [facility] Procedure:      VAS [place] UPPER EXTREMITY VENOUS DUPLEX Referring Phys: Dwyane Dee --------------------------------------------------------------------------------  Indications: Edema Other Indications: IV to LUE [facility]). Limitations: Body habitus and poor ultrasound/tissue interface. Comparison Study: No previous exams Performing Technologist: Jody Hill RVT, RDMS  Examination Guidelines: A complete evaluation includes B-mode imaging, spectral Doppler, color Doppler, and power Doppler as needed of all accessible portions of each vessel. Bilateral testing is considered an integral part of a complete examination. Limited examinations for reoccurring indications may be performed as noted.  Right Findings: +----------+------------+---------+-----------+----------+--------------------+ RIGHT     CompressiblePhasicitySpontaneousProperties      Summary        +----------+------------+---------+-----------+----------+--------------------+ Subclavian  Yes       Yes                   patent by                                                              color/doppler     +----------+------------+---------+-----------+----------+--------------------+  Left Findings: +----------+------------+---------+-----------+----------+--------------------+ LEFT      CompressiblePhasicitySpontaneousProperties      Summary        +----------+------------+---------+-----------+----------+--------------------+ IJV           Full       Yes       Yes                                   +----------+------------+---------+-----------+----------+--------------------+ Subclavian    Full       Yes       Yes                                   +----------+------------+---------+-----------+----------+--------------------+ Axillary      Full       Yes       Yes                                    +----------+------------+---------+-----------+----------+--------------------+ Brachial      Full       Yes       Yes                                   +----------+------------+---------+-----------+----------+--------------------+ Radial                                              patent by color only +----------+------------+---------+-----------+----------+--------------------+ Ulnar                                               patent by color only +----------+------------+---------+-----------+----------+--------------------+ Cephalic      Full                                                       +----------+------------+---------+-----------+----------+--------------------+ Basilic       Full       Yes       Yes                                   +----------+------------+---------+-----------+----------+--------------------+ Ulnar and radial veins not well visualized due to overlying edema. Patent by color only.  Summary:  Right: No evidence of thrombosis in the subclavian.  Left: Difficult exam  due to poor ultrasound/ tissue interface and diffuse subcutaneous edema of forearm. No obvious evidence of deep vein thrombosis in the upper extremity in areas visualized. No obvious evidence of superficial vein thrombosis in the upper extremity in areas visualized.  *See table(s) above for measurements and observations.  Diagnosing physician: Monica Martinez MD Electronically signed by Monica Martinez MD on 02/25/2022 at 1:53:04 PM.    Final    CT ABDOMEN PELVIS W CONTRAST  Result Date: 02/19/2022 CLINICAL DATA:  Postoperative abdominal pain. EXAM: CT ABDOMEN AND PELVIS WITH CONTRAST TECHNIQUE: Multidetector CT imaging of the abdomen and pelvis was performed using the standard protocol following bolus administration of intravenous contrast. RADIATION DOSE REDUCTION: This exam was performed according to the departmental dose-optimization program which includes automated  exposure control, adjustment of the mA and/or kV according to patient size and/or use of iterative reconstruction technique. CONTRAST:  163m OMNIPAQUE IOHEXOL 300 MG/ML  SOLN COMPARISON:  February 15, 2022. FINDINGS: Lower chest: Small bilateral pleural effusions are noted with probable bibasilar subsegmental atelectasis, right greater than left. Hepatobiliary: No cholelithiasis or biliary dilatation is noted. No acute abnormality seen in the liver. Pancreas: Unremarkable. No pancreatic ductal dilatation or surrounding inflammatory changes. Spleen: Normal in size without focal abnormality. Adrenals/Urinary Tract: Adrenal glands are unremarkable. Kidneys are normal, without renal calculi, focal lesion, or hydronephrosis. Bladder is unremarkable. Stomach/Bowel: Nasogastric tube tip is seen in proximal stomach. Status post colectomy with colostomy seen in left lower quadrant. Continued presence moderately dilated small bowel loops most consistent with ileus or less likely obstruction. Wall thickening is seen involving the distal duodenum and proximal jejunum suggesting enteritis. Vascular/Lymphatic: Aortic atherosclerosis. No enlarged abdominal or pelvic lymph nodes. Reproductive: Prostate is unremarkable. Other: Mild amount of free fluid is noted throughout the abdomen which most likely is postoperative in etiology. No definite hernia is noted. Musculoskeletal: No acute or significant osseous findings. IMPRESSION: Status post colectomy with colostomy seen in left lower quadrant. Continued presence of moderately dilated small bowel most consistent with ileus or less likely distal small bowel obstruction. Wall and fold thickening of distal duodenum and proximal jejunum is noted suggesting possible edema or enteritis. Mild amount of free fluid is noted throughout the abdomen which most likely is postoperative in etiology. Small bilateral pleural effusions are noted with associated bibasilar subsegmental atelectasis,  right greater than left. Electronically Signed   By: JMarijo ConceptionM.D.   On: 02/19/2022 15:45   DG Abd Portable 1V  Result Date: 02/19/2022 CLINICAL DATA:  Ileus. EXAM: PORTABLE ABDOMEN - 1 VIEW COMPARISON:  02/17/2022 FINDINGS: Nasogastric tube unchanged as it is coiled once over the stomach with tip over the gastric fundus in the left upper quadrant. Air is present throughout the colon. There are multiple air-filled dilated small bowel loops measuring up to 4.4 cm over the right lower quadrant. No evidence of free peritoneal air. Degenerative changes of the spine and hips. IMPRESSION: Multiple air-filled dilated small bowel loops measuring up to 4.4 cm over the right lower quadrant. Findings may be due to ileus versus early/partial small bowel obstruction. Electronically Signed   By: DMarin OlpM.D.   On: 02/19/2022 08:13   DG Abd Portable 1V  Result Date: 02/17/2022 CLINICAL DATA:  NG tube placement. EXAM: PORTABLE ABDOMEN - 1 VIEW COMPARISON:  02/10/2022 FINDINGS: An enteric tube is noted with coiled in the proximal-mid stomach. No other significant changes noted. IMPRESSION: Enteric tube with coiled in the proximal-mid stomach. Electronically Signed   By: JDellis Filbert  Hu M.D.   On: 02/17/2022 17:21   DG Abd 2 Views  Result Date: 02/17/2022 CLINICAL DATA:  Small bowel obstruction. EXAM: ABDOMEN - 2 VIEW COMPARISON:  CT scan February 15, 2022 FINDINGS: The NG tube has been pulled back. The distal tip is just below the GE junction and the side port is approximately 6 or 7 cm above the GE junction. Dilated loops of small bowel remain in the lower abdomen. No other abnormalities. IMPRESSION: 1. The NG tube has been pulled back with the side port 6 or 7 cm above the GE junction. Recommend advancement. 2. Dilated loops of small bowel remain consistent with ileus versus small-bowel obstruction. These results will be called to the ordering clinician or representative by the Radiologist Assistant, and  communication documented in the PACS or [employer]. Electronically Signed   By: Dorise Bullion III M.D.   On: 02/17/2022 11:46   CT ABDOMEN PELVIS WO CONTRAST  Result Date: 02/15/2022 CLINICAL DATA:  History of prior laparoscopy with colonic resection and colostomy, persistent abdominal pain and distension. EXAM: CT ABDOMEN AND PELVIS WITHOUT CONTRAST TECHNIQUE: Multidetector CT imaging of the abdomen and pelvis was performed following the standard protocol without IV contrast. RADIATION DOSE REDUCTION: This exam was performed according to the departmental dose-optimization program which includes automated exposure control, adjustment of the mA and/or kV according to patient size and/or use of iterative reconstruction technique. COMPARISON:  02/12/2022 FINDINGS: Lower chest: Bibasilar consolidation is noted with small right-sided pleural effusion. Hepatobiliary: Liver is well visualized and within normal limits. Gallbladder demonstrates vicarious excretion of contrast material. Perihepatic fluid is noted similar to that seen on the prior exam. Pancreas: Unremarkable. No pancreatic ductal dilatation or surrounding inflammatory changes. Spleen: Normal in size without focal abnormality. Adrenals/Urinary Tract: Adrenal glands are within normal limits. Kidneys demonstrate no renal calculi or obstructive changes. The bladder is partially distended. Stomach/Bowel: Hartmann's pouch is again identified. Left-sided colostomy is seen. The more proximal colon is unremarkable. The appendix is within normal limits. Stomach is decompressed by a gastric catheter. Mildly dilated loops of small bowel are noted particularly within the right abdomen somewhat progressed when compared with the prior exam. No transition zone is identified. Vascular/Lymphatic: Aortic atherosclerosis. No enlarged abdominal or pelvic lymph nodes. Reproductive: Prostate is unremarkable. Other: Fluid is noted throughout the abdomen to that seen on  the prior exam. This is most prominent along the right pericolic gutter and perihepatic regions although similar to the prior study. Small amount of free fluid and air is seen in the hip paddle renal fossa stable from the prior exam likely related to the prior surgery. No discrete abscess is noted. Musculoskeletal: No acute or significant osseous findings. IMPRESSION: Postsurgical changes as described. Free fluid is noted within the abdomen relatively stable in appearance from the prior exam. No discrete abscess is noted at this time. Multiple mildly dilated loops of small bowel in the right mid abdomen slightly greater than that seen on the prior exam. This is again likely related to a postoperative ileus. Correlate with the physical exam. Right-sided pleural effusion and bibasilar consolidation. Electronically Signed   By: Inez Catalina M.D.   On: 02/15/2022 02:12   [place] EKG SITE RITE  Result Date: 02/12/2022 If Site Rite image not attached, placement could not be confirmed due to current cardiac rhythm.  CT ABDOMEN PELVIS WO CONTRAST  Result Date: 02/12/2022 CLINICAL DATA:  Five days postop exploratory laparotomy and left colectomy and colostomy. Rising white blood cell count.  EXAM: CT ABDOMEN AND PELVIS WITHOUT CONTRAST TECHNIQUE: Multidetector CT imaging of the abdomen and pelvis was performed following the standard protocol without IV contrast. RADIATION DOSE REDUCTION: This exam was performed according to the departmental dose-optimization program which includes automated exposure control, adjustment of the mA and/or kV according to patient size and/or use of iterative reconstruction technique. COMPARISON:  CT scan 02/07/2022 FINDINGS: Lower chest: Small bilateral pleural effusions and moderate overlying atelectasis. Hepatobiliary: No hepatic lesions or intrahepatic biliary dilatation. High attenuation material in the gallbladder is likely vicarious excretion related to the prior CT scan. No common  bile duct dilatation. Pancreas: No mass, inflammation or ductal dilatation. Spleen: Normal size.  No focal lesions. Adrenals/Urinary Tract: The adrenal glands and kidneys are unremarkable. The bladder is unremarkable. Stomach/Bowel: The stomach contains an NG tube. The duodenum is unremarkable. There are mildly dilated small bowel loops with scattered air-fluid levels, likely postop ileus. Patient has had a left colectomy. There is a left transverse colon colostomy and a Hartmann's pouch. Vascular/Lymphatic: The aorta is normal in caliber. Small scattered mesenteric and retroperitoneal lymph nodes. Reproductive: The prostate gland and seminal vesicles are unremarkable. Other: Scattered free fluid is noted in the abdomen and pelvis. There is also a small amount of free air and free fluid in the hepatic renal fossa but I do not see a discrete drainable abscess. I do not see a discrete Musculoskeletal: No significant bony findings. IMPRESSION: 1. Status post left colectomy with a left transverse colon colostomy and a Hartmann's pouch. 2. Scattered free fluid in the abdomen and pelvis and a small amount of free air and free fluid in the hepatorenal fossa but I do not see a discrete drainable abscess. 3. Mildly dilated small bowel loops with scattered air-fluid levels, likely postop ileus. 4. Small bilateral pleural effusions and moderate overlying atelectasis. Electronically Signed   By: Marijo Sanes M.D.   On: 02/12/2022 15:34   DG CHEST PORT 1 VIEW  Result Date: 02/11/2022 CLINICAL DATA:  Fever. EXAM: PORTABLE CHEST 1 VIEW COMPARISON:  AP chest 02/07/2022, CT chest 02/07/2022 FINDINGS: An enteric tube descends below the diaphragm with the tip excluded by collimation, new from prior 02/07/2022 radiograph. Cardiac silhouette is again moderately enlarged. Mediastinal contours are within normal limits. Moderately decreased lung volumes. Lateral left lower lung horizontal linear subsegmental atelectasis versus  scarring, similar to prior radiograph and CT. No pneumothorax. No definite pleural effusion. No acute skeletal abnormality. IMPRESSION: 1. Moderately decreased lung volumes with unchanged left basilar subsegmental atelectasis. 2. New enteric tube descends below the diaphragm. Electronically Signed   By: Yvonne Kendall M.D.   On: 02/11/2022 09:48   DG Abd 1 View  Result Date: 02/10/2022 CLINICAL DATA:  Check gastric catheter placement EXAM: ABDOMEN - 1 VIEW COMPARISON:  None Available. FINDINGS: Gastric catheter is noted extending into the stomach. Scattered large and small bowel gas is noted. No free air is seen. IMPRESSION: Gastric catheter within the stomach. Electronically Signed   By: Inez Catalina M.D.   On: 02/10/2022 19:49   CT Angio Chest PE W and/or Wo Contrast  Result Date: 02/07/2022 CLINICAL DATA:  Pulmonary embolism (PE) suspected, high prob EXAM: CT ANGIOGRAPHY CHEST WITH CONTRAST TECHNIQUE: Multidetector CT imaging of the chest was performed using the standard protocol during bolus administration of intravenous contrast. Multiplanar CT image reconstructions and MIPs were obtained to evaluate the vascular anatomy. RADIATION DOSE REDUCTION: This exam was performed according to the departmental dose-optimization program which includes automated exposure control,  adjustment of the mA and/or kV according to patient size and/or use of iterative reconstruction technique. CONTRAST:  135m OMNIPAQUE IOHEXOL 350 MG/ML SOLN COMPARISON:  None Available. FINDINGS: Cardiovascular: Mild cardiomegaly. Scattered coronary artery and aortic calcifications. No aneurysm. Mediastinum/Nodes: No mediastinal, hilar, or axillary adenopathy. Trachea and esophagus are unremarkable. Thyroid unremarkable. Lungs/Pleura: No pleural effusions.  Bibasilar atelectasis. Upper Abdomen: See abdominal CT report Musculoskeletal: Chest wall soft tissues are unremarkable. No acute bony abnormality. Review of the MIP images confirms  the above findings. IMPRESSION: No evidence of pulmonary embolus. Bibasilar atelectasis. No acute cardiopulmonary disease. Electronically Signed   By: KRolm BaptiseM.D.   On: 02/07/2022 23:32   CT ABDOMEN PELVIS W CONTRAST  Result Date: 02/07/2022 CLINICAL DATA:  Sepsis EXAM: CT ABDOMEN AND PELVIS WITH CONTRAST TECHNIQUE: Multidetector CT imaging of the abdomen and pelvis was performed using the standard protocol following bolus administration of intravenous contrast. RADIATION DOSE REDUCTION: This exam was performed according to the departmental dose-optimization program which includes automated exposure control, adjustment of the mA and/or kV according to patient size and/or use of iterative reconstruction technique. CONTRAST:  1067mOMNIPAQUE IOHEXOL 350 MG/ML SOLN COMPARISON:  None Available. FINDINGS: Lower chest: Bibasilar atelectasis. Hepatobiliary: No focal hepatic abnormality. Gallbladder unremarkable. Pancreas: No focal abnormality or ductal dilatation. Spleen: No focal abnormality.  Normal size. Adrenals/Urinary Tract: No adrenal abnormality. No focal renal abnormality. No stones or hydronephrosis. Urinary bladder is unremarkable. Stomach/Bowel: Areas of bowel thickening within the transverse colon and descending colon. There also several mid small bowel loops which are thick walled in the lower abdomen and upper pelvis. Stomach grossly unremarkable. No bowel obstruction. Vascular/Lymphatic: Scattered aortic calcifications. No evidence of aneurysm or adenopathy. Reproductive: No visible focal abnormality. Other: There is moderate free fluid around the liver, spleen, in the right paracolic gutter and in the pelvis. Moderate free air is noted. Exact source is difficult to determine. Fluid and gas noted near the thick walled small bowel loop in the lower abdomen/upper pelvis which is possibly the source. Musculoskeletal: No acute bony abnormality. IMPRESSION: Findings compatible with perforated bowel  with moderate free fluid and free air. Exact source not definitely visualized. Possibilities include a thick walled small bowel loop in the lower abdomen/upper pelvis, likely mid small bowel for the transverse colon/descending colon. Bibasilar atelectasis. Aortic atherosclerosis. Critical Value/emergent results were called by telephone at the time of interpretation on 02/07/2022 at 11:30 pm [facility] these results. Electronically Signed   By: KeRolm Baptise.D.   On: 02/07/2022 23:30   DG Abd Portable 2 Views  Result Date: 02/07/2022 CLINICAL DATA:  sepsis EXAM: PORTABLE ABDOMEN - 2 VIEW COMPARISON:  None Available. FINDINGS: Limited evaluation due to overlapping osseous structures and overlying soft tissues. Gaseous distension of the large bowel. Question gaseous dilatation of a focal loop of small bowel within the right lower abdomen. There is no evidence of free air. No radio-opaque calculi or other significant radiographic abnormality is seen. IMPRESSION: Question gaseous dilatation of a focal loop of small bowel within the right lower abdomen. Limited evaluation due to overlapping osseous structures and overlying soft tissues. If clinically indicated, please consider CT abdomen pelvis with intravenous contrast for further evaluation. Electronically Signed   By: MoIven Finn.D.   On: 02/07/2022 21:28   DG Chest Port 1 View  Result Date: 02/07/2022 CLINICAL DATA:  Questionable sepsis - evaluate for abnormality EXAM: PORTABLE CHEST 1 VIEW COMPARISON:  Chest x-ray 06/13/2011 FINDINGS:  The heart and mediastinal contours are unchanged. Low lung volumes with bibasilar airspace opacities likely representing atelectasis. No focal consolidation. No pulmonary edema. No pleural effusion. No pneumothorax. No acute osseous abnormality. IMPRESSION: Low lung volumes with no definite acute cardiopulmonary abnormality. Electronically Signed   By: Iven Finn M.D.    On: 02/07/2022 21:19    Labs:  CBC: Recent Labs    02/24/22 0530 02/25/22 0550 02/25/22 1201 02/25/22 1329 02/25/22 1827 02/26/22 0521  WBC 5.2 4.9 5.9  --   --  4.8  HGB 8.5* 7.7* 8.6* 8.1* 6.1* 8.1*  HCT 27.0* 24.9* 27.2* 25.8* 19.1* 25.1*  PLT 294 254 296  --   --  205    COAGS: Recent Labs    02/07/22 2030 02/26/22 0816  INR 1.3* 1.3*  APTT 30  --     BMP: Recent Labs    02/23/22 0500 02/24/22 0530 02/25/22 0550 02/26/22 0521  NA 137 136 138 140  K 4.1 4.3 3.8 3.5  CL 102 101 100 102  CO2 32 31 32 34*  GLUCOSE 142* 160* 132* 115*  BUN '11 12 9 11  '$ CALCIUM 7.7* 7.8* 8.0* 7.8*  CREATININE 0.36* 0.31* 0.34* <0.30*  GFRNONAA >60 >60 >60 NOT CALCULATED    LIVER FUNCTION TESTS: Recent Labs    02/22/22 0459 02/24/22 0530 02/25/22 0550 02/26/22 0521  BILITOT 0.2* 0.4 0.6 0.7  AST '24 24 20 15  '$ ALT '15 17 15 11  '$ ALKPHOS 93 138* 138* 84  PROT 5.3* 5.9* 5.9* 5.4*  ALBUMIN <1.5* <1.5* 2.0* 2.4*    TUMOR MARKERS: No results for input(s): "AFPTM", "CEA", "CA199", "CHROMGRNA" in the last 8760 hours.  Assessment and Plan: 66 y.o. male with past medical history of ADHD, anxiety, colon polyps, hyperlipidemia, hypertension, obesity, sleep apnea on CPAP, diabetes, secondary male hypogonadism,  peripheral neuropathy admitted to [facility] on 11/23 with abdominal pain, fever, recent syncopal episode.  CT at that time showed perforated bowel with moderate free fluid and free air.  On 11/24 patient underwent exploratory laparotomy with descending and sigmoid colon resection and transverse colostomy.  Surgical path showed transmural inflammation consistent with IBD.  He has had some recent bright red blood per rectum . He underwent flex sig which showed bleeding ulcerations ( hemostatic gel used) and CTA on 12/11 which revealed:  VASCULAR   1. Examination is POSITIVE for acute extravasation within the rectum, with target vessel originating from a branch of the  SMV consistent with venous hemorrhage.   NON-VASCULAR   1. Postsurgical changes of hemicolectomy with colostomy at the LEFT lower quadrant. Small fat-containing containing parastomal hernia without evidence of obstruction. 2. Edematous loops of bowel in the LEFT lower quadrant, about the staple line, with adjacent 10 cm a-and-fluid containing, rim enhancing fluid collection consistent with early abscess  Temp currently 99, WBC 4.8, hgb 8.1, plts nl, PT 16.3, INR 1.3, creat < .30, latest blood cx neg; request now received from surgery for image guided left abdominal fluid collection drainage; imaging studies have been reviewed by Dr. Dwaine Gale. Risks and benefits discussed with the patient /spouse including bleeding, infection, damage to adjacent structures, bowel perforation/fistula connection, and sepsis.  All of the patient's questions were answered, patient is agreeable to proceed. Consent signed and in chart.  Also plan for paracentesis.  Procedures planned for today.   Thank you for this interesting consult.  I greatly enjoyed meeting Richard [name] and look forward to participating in their care.  A copy of this report was sent to the requesting provider on this date.  Electronically Signed: D. Rowe Robert, PA-C 02/26/2022, 10:05 AM   I spent a total of  25 minutes   in face to face in clinical consultation, greater than 50% of which was counseling/coordinating care for image guided drainage of left abdominal fluid collection and paracentesis

## 2022-02-26 NOTE — TOC Progression Note (Signed)
Transition of Care [facility]) - Progression Note    Patient Details  Name: Richard [name] MRN: [id number] Date of Birth: [birth year]  Transition [facility] CM/SW Contact  Servando Snare, [place] Phone Number: 02/26/2022, 9:23 AM  Clinical Narrative:    TOC following for disposition.    Expected Discharge Plan: IP Rehab Facility Barriers to Discharge: Continued Medical Work up  Expected Discharge Plan and Services Expected Discharge Plan: [place]   Discharge Planning Services: CM Consult   Living arrangements for the past 2 months: Single Family Home                           HH Arranged: PT [place]: [place] Date Opp: 02/13/22 Time [place]: 3299 Representative spoke with at [place]: [place] ([place]) Interventions    Readmission Risk Interventions     No data to display

## 2022-02-26 NOTE — Op Note (Addendum)
[facility] Patient Name: Richard [name] Procedure Date: 02/25/2022 MRN: [id number] Attending MD: Justice Britain , MD, [id number] Date of Birth: 1955-05-09 CSN: [id number] Age: 66 Admit Type: Inpatient Procedure:                Flexible Sigmoidoscopy Indications:              Hematochezia, Abnormal CT of the GI tract Providers:                Justice Britain, MD, Benetta Spar, Technician Referring MD:             Inpatient Medical/Surgical Teams Medicines:                None Complications:            No immediate complications. Estimated Blood Loss:     Estimated blood loss was minimal. Procedure:                Pre-Anesthesia Assessment:                           - Prior to the procedure, a History and Physical                            was performed, and patient medications and                            allergies were reviewed. The patient's tolerance of                            previous anesthesia was also reviewed. The risks                            and benefits of the procedure and the sedation                            options and risks were discussed with the patient.                            All questions were answered, and informed consent                            was obtained. Prior Anticoagulants: The patient has                            taken Lovenox (enoxaparin), last dose was day of                            procedure. ASA Grade Assessment: IV - A patient                            with severe systemic disease that is a constant                            threat to life. After reviewing the risks and  benefits, the patient was deemed in satisfactory                            condition to undergo the procedure.                           After obtaining informed consent, the scope was                            passed under direct vision. The [id number]                            ([id number]) Olympus  therapeutic endoscope was                            introduced through the anus and advanced to the the                            sigmoid colon. The flexible sigmoidoscopy was                            technically difficult and complex due to excessive                            bleeding and poor endoscopic visualization.                            Successful completion of the procedure was aided by                            performing the maneuvers documented (below) in this                            report. The quality of the bowel preparation was                            adequate. Scope In: Scope Out: Findings:      The digital rectal exam findings include hemorrhoids. Pertinent       negatives include no palpable rectal lesions.      Clotted blood was found in the rectum. Lavage of the area was performed       using copious amounts, resulting in clearance with fair visualization.       Suction via Endoscope was performed. Using a Jabier Mutton net and large snares       to remove the clot were performed. This took in-of-itselt, over 30       minutes.      An area of mildly congested mucosa was found in the recto-sigmoid colon       and in the sigmoid colon. No active or recent bleeding noted in this       area.      Medium sized, non-bleeding rectal varices were found.      Multiple (8) 10-50 mm ulcers were found in the mid rectum and in the       distal rectum. There was one deep ulcer that went from  the mid-rectum       and extended to the dentate line and hemorrhoidal line. No active       bleeding was present. Stigmata of recent bleeding were present in some       of the ulcer beds (noted by clot and some small pigmeented areas. To       improve chance at healing the region, Hemostatic PuraSTAT gel was       successfully injected to the ulcer beds (3 mL total) for hemostasis.      Non-bleeding non-thrombosed internal hemorrhoids were found during       retroflexion, during  perianal exam and during digital exam. The       hemorrhoids were Grade II (internal hemorrhoids that prolapse but reduce       spontaneously). Impression:               - Hemorrhoids found on digital rectal exam.                           - Blood in the rectum. Greater than 30 minutes                            taken to remove clot burden.                           - Congested mucosa in the recto-sigmoid colon and                            in the sigmoid colon - but no active bleeding..                           - Rectal varices. These were non-bleeding and not                            involved.                           - Multiple ulcers in the mid rectum and in the                            distal rectum. Recent bleeding noted from these.                            Most concern from the lesion from mid-rectum deeply                            cratered to the dentate/hemorrhoidal line. PuraSTAT                            hemostatic gel used.                           - Non-bleeding non-thrombosed internal hemorrhoids. Moderate Sedation:      Not Applicable - Patient had care per Anesthesia. Recommendation:           - The patient will be observed post-procedure,  until all discharge criteria are met.                           - Return patient to ICU for ongoing care.                           - No chemical VTE PPx for at least 48-72 hours if                            possible.                           - Minimize any type of rectal tube placement                           - Monitor Hgb/Hct.                           - If significant recurrent bleeding is occuring,                            may discuss with GI, but most likely will need a                            surgical intervention/evaluation in setting of the                            deep ulcers going to the hemorrhoidal line and                            potnetial need for rigid proctoscopy and  sewing.                           - Will consider in next few days, Rectal Canasa                            suppositories not tonight or tomorrow.                           - The findings and recommendations were discussed                            with the patient.                           - The findings and recommendations were discussed                            with the patient's family.                           - The findings and recommendations were discussed                            with the referring physician. Procedure  Code(s):        --- Professional ---                           [phone], Sigmoidoscopy, flexible; with control of                            bleeding, any method Diagnosis Code(s):        --- Professional ---                           K62.5, Hemorrhage of anus and rectum                           K63.89, Other specified diseases of intestine                           K62.6, Ulcer of anus and rectum                           K64.1, Second degree hemorrhoids                           K92.1, Melena (includes Hematochezia)                           R93.3, Abnormal findings on diagnostic imaging of                            other parts of digestive tract CPT copyright 2022 [employer]. All rights reserved. The codes documented in this report are preliminary and upon coder review may  be revised to meet current compliance requirements. Justice Britain, MD 02/26/2022 5:16:28 AM Number of Addenda: 0

## 2022-02-26 NOTE — Progress Notes (Signed)
PT Cancellation Note  Patient Details Name: Richard [name] MRN: [id number] DOB: 05-16-1955   Cancelled Treatment:    Reason Eval/Treat Not Completed: (P) Medical issues which prohibited therapy. RN & GI requesting PT hold today as pt is scheduled for CT scan secondary to abscess with related GI bleed. PT will continue to follow acutely and check back as schedule and pt status allows which likely will be another day.   Coolidge Breeze, PT, DPT [facility] Department Office: [phone] Weekend pager: [phone]  Coolidge Breeze 02/26/2022, 10:12 AM

## 2022-02-26 NOTE — PMR Pre-admission (Signed)
PMR Admission Coordinator Pre-Admission Assessment  Patient: Richard [name] is an 66 y.o., male MRN: [id number] DOB: 1956/02/19 Height: _0  (175.3 cm) Weight: 118.4 kg  Insurance Information HMO:     PPO: yes     PCP:      IPA:      80/20:      OTHER:  PRIMARY: [employer] Generic      Policy#: [id number]      Subscriber: patient CM Name: Delray Alt      Phone#: [phone] ext [phone]     Fax#: [phone] Pre-Cert#: [id number] Received approval on 02/28/22 from Delray Alt. Pt approved for 7 days starting 03/01/22. Updates due 03/08/22 by 1pm CST.     Employer:  Benefits:  Phone #: [phone]     Name:  Eff. Date: 03/18/18-still active     Deduct: $1,000 ($1,000 met)      Out of Pocket Max: $5,500 ($5,500 met)      Life Max: NA CIR: $300 co-pay/admission, then 70% coverage, 30% co-insurance      SNF: 70% coverage, 30% co-insurance Outpatient: 70% coverage, 30% co-insurance     Co-Pay:  Home Health: 70% coverage      Co-Pay: 30% co-insurance DME: 70% coverage     Co-Pay: 30% co-insurance Providers: in-network SECONDARY:  not yet enrolled in Medicare      Policy#:      Phone#:   Development worker, community:       Phone#:   The Engineer, petroleum" for patients in Inpatient Rehabilitation Facilities with attached "Privacy Act [place] Records" was provided and verbally reviewed with: Patient  Emergency Contact Information Contact Information     Name Relation Home Work Mobile   [place] Spouse [phone]  [phone]       Current Medical History  Patient Admitting Diagnosis: Peritonitis, Colon perforation  History of Present Illness: Pt is a 66 yo male former smoker who  developed diarrhea about 3 to 4 weeks prior to admission.  He was seen by Dr. [name] with GI and had colonoscopy on 02/04/22.  This showed diffuse, severe inflammation throughout the colon with mucosal friability. Biopsies were performed.  He was found to have Crohn's  colitis and started on prednisone.  He started having discomfort in his abdomen, and  on 02/08/22 Pt. Was brought to the [facility] ED. On admission, he had fever 102.1 F with tachycardia.  CT abd/pelvis showed findings compatible with perforated bowel with moderate free fluid and free air.  He was started on antibiotics and IV fluids.  Surgery consulted to assess for emergent laparotomy. On 11/24, patient underwent exploratory laparotomy, descending and sigmoid colon resection, transverse colostomy by Dr. [name].Surgical pathology showed transmural inflammation consistent with IBD. His postop course was complicated by almost 2 weeks of postop ileus which gradually resolved.  In the 2 weeks duration, he had significant fluid and electrolyte imbalance, hypoxia, fever, encephalopathy and physical deconditioning.  He received TPN, discontinued on 02/23/2022,  as he is tolerating a solid diet.. Pt. Also with ABL post op, received 1 unit PRBCS  12/9. Pt. Seen by PT/OT who recommended CIR to assist return to PLOF.    Patient's medical [facility] from [facility]  has been reviewed by the rehabilitation admission coordinator and physician.  Past Medical History  Past Medical History:  Diagnosis Date   Adult ADHD    Allergic rhinitis    Anxiety    claustrophobic   Bronchopneumonia 11/03/2013   Colon polyps 2012  and 2013   All hyperplastic except one adenomatous w/out high grade dysplasia (recall 2023 per Dr. Deatra Ina)   COVID-19 virus infection 09/29/2020   Elevated transaminase level 2017   Viral hep screening neg.  Suspect fatty liver.   Hyperlipidemia    Hypertension    Obesity    OSA on CPAP    Severe.  CPAP 10 cm H2O.  Follows Dr. Ander Slade.   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 [name] to do surg   Pneumonia    Seborrheic dermatitis of scalp    and face: hydrocort 2% cream bid prn to face, and fluocinonide 0.5% sol'n to scalp bid prn per Dr. Allyson Sabal (04/2014)    Secondary male hypogonadism 04/2021   Type 2 diabetes mellitus with microalbuminuria ([place]) DM dx-09/27/2015. Microalb 2020    Has the patient had major surgery during 100 days prior to admission? Yes  Family History   family history includes Allergies in his father; Cancer in his father; Lung cancer in his father.  Current Medications  Current Facility-Administered Medications:    0.9 %  sodium chloride infusion (Manually program via Guardrails IV Fluids), , Intravenous, Once, Dwyane Dee, MD   0.9 %  sodium chloride infusion (Manually program via Guardrails IV Fluids), , Intravenous, Once, Dwyane Dee, MD   acetaminophen (OFIRMEV) IV 1,000 mg, 1,000 mg, Intravenous, Q6H, Dwyane Dee, MD, Stopped at 02/26/22 0028   acetaminophen (TYLENOL) tablet 650 mg, 650 mg, Oral, Q4H PRN, Dwyane Dee, MD   albumin human 25 % solution 25 g, 25 g, Intravenous, Q6H, Dwyane Dee, MD, Last Rate: 60 mL/hr at 02/26/22 0526, 25 g at 02/26/22 0526   alum & mag hydroxide-simeth (MAALOX/MYLANTA) 200-200-20 MG/5ML suspension 30 mL, 30 mL, Oral, Q6H PRN, Michael Boston, MD   ascorbic acid (VITAMIN C) tablet 500 mg, 500 mg, Oral, BID, Hall, Carole N, DO, 500 mg at 02/25/22 0901   atorvastatin (LIPITOR) tablet 40 mg, 40 mg, Oral, Daily, Gross, Remo Lipps, MD, 40 mg at 02/25/22 0901   Chlorhexidine Gluconate Cloth 2 % PADS 6 each, 6 each, Topical, Daily, Chesley Mires, MD, 6 each at 02/26/22 0600   chlorproMAZINE (THORAZINE) 12.5 mg in sodium chloride 0.9 % 25 mL IVPB, 12.5 mg, Intravenous, Q6H PRN, Simaan, Elizabeth S, PA-C, Stopped at 02/14/22 0913   feeding supplement (ENSURE ENLIVE / ENSURE PLUS) liquid 237 mL, 237 mL, Oral, TID BM, Esterwood, Amy S, PA-C   furosemide (LASIX) injection 40 mg, 40 mg, Intravenous, Daily, Girguis, David, MD, 40 mg at 02/25/22 0900   HYDROmorphone (DILAUDID) injection 0.5-1 mg, 0.5-1 mg, Intravenous, Z6X PRN, Leighton Ruff, MD, 1 mg at 09/60/45 0609   insulin aspart (novoLOG)  injection 0-15 Units, 0-15 Units, Subcutaneous, TID WC, BellRonaldo Miyamoto, RPH, 3 Units at 02/25/22 1658   insulin aspart (novoLOG) injection 0-5 Units, 0-5 Units, Subcutaneous, QHS, Bell, Michelle T, RPH   lip balm (CARMEX) ointment, , Topical, BID, Michael Boston, MD, Given at 02/25/22 0902   magic mouthwash, 15 mL, Oral, QID PRN, Michael Boston, MD   magnesium sulfate IVPB 2 g 50 mL, 2 g, Intravenous, Once, Dwyane Dee, MD   menthol-cetylpyridinium (CEPACOL) lozenge 3 mg, 1 lozenge, Oral, PRN, Michael Boston, MD   metoCLOPramide (REGLAN) tablet 5 mg, 5 mg, Oral, BID WC, Dustin Flock E, MD, 5 mg at 02/25/22 1338   metoprolol tartrate (LOPRESSOR) 25 mg/10 mL oral suspension 6.25 mg, 6.25 mg, Oral, BID, Dwyane Dee, MD, 6.25 mg at 02/25/22 0909   multivitamin with minerals  tablet 1 tablet, 1 tablet, Oral, Daily, Eudelia Bunch, RPH, 1 tablet at 02/25/22 0901   naphazoline-glycerin (CLEAR EYES REDNESS) ophth solution 1-2 drop, 1-2 drop, Both Eyes, QID PRN, Michael Boston, MD   ondansetron [facility]) injection 4 mg, 4 mg, Intravenous, K5V PRN, Leighton Ruff, MD, 4 mg at 37/48/27 1446   Oral care mouth rinse, 15 mL, Mouth Rinse, PRN, Allie Bossier, MD, 15 mL at 02/16/22 1000   pantoprazole (PROTONIX) EC tablet 40 mg, 40 mg, Oral, Daily, Eudelia Bunch, RPH, 40 mg at 02/25/22 0901   phenol (CHLORASEPTIC) mouth spray 2 spray, 2 spray, Mouth/Throat, PRN, Michael Boston, MD   piperacillin-tazobactam (ZOSYN) IVPB 3.375 g, 3.375 g, Intravenous, Q8H, Bell, Ronaldo Miyamoto, RPH, Last Rate: 12.5 mL/hr at 02/26/22 0529, 3.375 g at 02/26/22 0529   polycarbophil (FIBERCON) tablet 625 mg, 625 mg, Oral, BID, Michael Boston, MD, 625 mg at 02/25/22 0901   potassium chloride SA (KLOR-CON M) CR tablet 40 mEq, 40 mEq, Oral, Once, Dwyane Dee, MD   simethicone (MYLICON) 40 MB/8.6LJ suspension 80 mg, 80 mg, Oral, QID PRN, Michael Boston, MD   sodium chloride (OCEAN) 0.65 % nasal spray 1-2 spray, 1-2 spray, Each  Nare, PRN, Dwyane Dee, MD   sodium chloride flush (NS) 0.9 % injection 10-40 mL, 10-40 mL, Intracatheter, Q12H, Dahal, Binaya, MD, 20 mL at 02/25/22 2300   sodium chloride flush (NS) 0.9 % injection 10-40 mL, 10-40 mL, Intracatheter, PRN, Dahal, Binaya, MD, 10 mL at 02/16/22 1849   thiamine (VITAMIN B1) tablet 100 mg, 100 mg, Oral, Daily, Eudelia Bunch, RPH, 100 mg at 02/25/22 0901  Patients Current Diet:  Diet Order             Diet NPO time specified  Diet effective now                   Precautions / Restrictions Precautions Precautions: Other (comment) Precaution Comments: L colostomy, monitor o2/HR Other Brace: binder was not in place Restrictions Weight Bearing Restrictions: No   Has the patient had 2 or more falls or a fall with injury in the past year? No  Prior Activity Level Community (5-7x/wk): Pt. active in the community PTA  Prior Functional Level Self Care: Did the patient need help bathing, dressing, using the toilet or eating? Independent  Indoor Mobility: Did the patient need assistance with walking from room to room (with or without device)? Independent  Stairs: Did the patient need assistance with internal or external stairs (with or without device)? Independent  Functional Cognition: Did the patient need help planning regular tasks such as shopping or remembering to take medications? Independent  Patient Information Are you of Hispanic, Latino/a,or Spanish origin?: A. No, not of Hispanic, Latino/a, or Spanish origin What is your race?: A. White Do you need or want an interpreter to communicate with a doctor or health care staff?: 0. No  Patient's Response To:  Health Literacy and Transportation Is the patient able to respond to health literacy and transportation needs?: Yes Health Literacy - How often do you need to have someone help you when you read instructions, pamphlets, or other written material from your doctor or pharmacy?: Never In  the past 12 months, has lack of transportation kept you from medical appointments or from getting medications?: No In the past 12 months, has lack of transportation kept you from meetings, work, or from getting things needed for daily living?: No  Development worker, international aid / Paramedic  Devices/Equipment: Eyeglasses Home Equipment: None  Prior Device Use: Indicate devices/aids used by the patient prior to current illness, exacerbation or injury? Walker  Current Functional Level Cognition  Overall Cognitive Status: Within Functional Limits for tasks assessed Orientation Level: Oriented to person, Oriented to situation, Oriented to place, Disoriented to time General Comments: Cracking jokes today    Extremity Assessment (includes Sensation/Coordination)  Upper Extremity Assessment: Overall WFL for tasks assessed  Lower Extremity Assessment: Defer to PT evaluation RLE Sensation: history of peripheral neuropathy LLE Sensation: history of peripheral neuropathy    ADLs  Overall ADL's : Needs assistance/impaired Eating/Feeding Details (indicate cue type and reason): can only have some ice chips at this time. Grooming: Set up, Sitting Upper Body Bathing: Moderate assistance Upper Body Bathing Details (indicate cue type and reason): too many lines and leads to assess fully at this time Lower Body Bathing: Total assistance, Bed level Upper Body Dressing : Maximal assistance, Sitting Lower Body Dressing: Bed level, Total assistance Toilet Transfer: +2 for safety/equipment, +2 for physical assistance Toilet Transfer Details (indicate cue type and reason): patient attempted sitting EOB with noted cramps in BLE with repositioning on edge of bed. returned to supine with +2 Toileting- Clothing Manipulation and Hygiene: Total assistance, Bed level Functional mobility during ADLs: +2 for physical assistance, +2 for safety/equipment    Mobility  Overal bed mobility: Needs Assistance Bed  Mobility: Rolling, Sidelying to Sit, Sit to Sidelying Rolling: Min assist Sidelying to sit: Min assist, HOB elevated Sit to supine: +2 for physical assistance, +2 for safety/equipment, Max assist Sit to sidelying: Total assist, +2 for physical assistance, +2 for safety/equipment General bed mobility comments: Needed a lot of encouragement and increased time to physicall move himself. Overall min assist to pull up on therapist hand to come into sitting.    Transfers  Overall transfer level: Needs assistance Equipment used: Rolling walker (2 wheels) Transfers: Sit to/from Stand Sit to Stand: Min assist, +2 physical assistance, From elevated surface Bed to/from chair/wheelchair/BSC transfer type:: Via Lift equipment Step pivot transfers: Max assist, +2 safety/equipment Transfer via Lift Equipment: Stedy General transfer comment: Totay only min x 2 to on each site to stand with use of stedy and patient pulling up on stedy bar - once to stand from bed then to stand for placement in chair.    Ambulation / Gait / Stairs / Wheelchair Mobility  Ambulation/Gait General Gait Details: deferred    Posture / Balance Balance Overall balance assessment: Needs assistance Sitting-balance support: No upper extremity supported, Feet supported Sitting balance-Leahy Scale: Fair Standing balance support: During functional activity, Reliant on assistive device for balance Standing balance-Leahy Scale: Poor Standing balance comment: unable    Special needs/care consideration Skin surgical incision  and Special service needs colostomy   Previous Home Environment (from acute therapy documentation) Living Arrangements: Spouse/significant other  Lives With: Spouse Available Help at Discharge: Family Type of Home: House Home Layout: Two level Alternate Level Stairs-Rails: Left Alternate Level Stairs-Number of Steps: 15 down to his  office Home Access: Stairs to enter Entrance Stairs-Rails: Right,  Left Entrance Stairs-Number of Steps: 5 Bathroom Shower/Tub: Multimedia programmer: Standard Bathroom Accessibility: Yes How Accessible: Accessible via wheelchair Home Care Services: No  Discharge Living Setting Plans for Discharge Living Setting: Patient's home Type of Home at Discharge: House Discharge Home Layout: Two level, Able to live on main level with bedroom/bathroom Alternate Level Stairs-Rails: Left Alternate Level Stairs-Number of Steps: 15 down to office Discharge Home Access: Stairs  to enter Entrance Stairs-Rails: Can reach both, Left, Right Entrance Stairs-Number of Steps: 5 Discharge Bathroom Shower/Tub: Tub/shower unit Discharge Bathroom Toilet: Standard Discharge Bathroom Accessibility: No Does the patient have any problems obtaining your medications?: No  Social/Family/Support Systems Patient Roles: Spouse Contact Information: [phone] Anticipated Caregiver: 24/7 Anticipated Caregiver's Contact Information: Ami Thornsberry Ability/Limitations of Caregiver: Min A Caregiver Availability: 24/7 Discharge Plan Discussed with Primary Caregiver: Yes Is Caregiver In Agreement with Plan?: Yes  Goals Patient/Family Goal for Rehab: PT/OT Supervision Expected length of stay: 10-12 days Pt/Family Agrees to Admission and willing to participate: Yes Program Orientation Provided & Reviewed with Pt/Caregiver Including Roles  & Responsibilities: No  Decrease burden of Care through IP rehab admission: n/a   Possible need for SNF placement upon discharge: not anticipated   Patient Condition: I have reviewed medical records from [facility] , spoken with CM, and patient and spouse. I discussed via phone for inpatient rehabilitation assessment.  Patient will benefit from ongoing PT and OT, can actively participate in 3 hours of therapy a day 5 days of the week, and can make measurable gains during the admission.  Patient will also benefit from the coordinated  team approach during an Inpatient Acute Rehabilitation admission.  The patient will receive intensive therapy as well as Rehabilitation physician, nursing, social worker, and care management interventions.  Due to safety, skin/wound care, disease management, medication administration, pain management, and patient education the patient requires 24 hour a day rehabilitation nursing.  The patient is currently Mod A with mobility and basic ADLs.  Discharge setting and therapy post discharge at home with home health is anticipated.  Patient has agreed to participate in the Acute Inpatient Rehabilitation Program and will admit today.  Preadmission Screen Completed By:  Genella Mech, with updates by Gayland Curry 02/26/2022 8:19 AM ______________________________________________________________________   Discussed status with Dr. Naaman Plummer on 03/01/22  at 10:07 AM and received approval for admission today.  Admission Coordinator:  Genella Mech, CCC-SLP, time 10:08 AM/Date 03/01/22    Assessment/Plan: Diagnosis: debility after peritonitis Does the need for close, 24 hr/day Medical supervision in concert with the patient's rehab needs make it unreasonable for this patient to be served in a less intensive setting? Yes Co-Morbidities requiring supervision/potential complications: ostomy, adhd, pain, nutrition considerations Due to bladder management, bowel management, safety, skin/wound care, disease management, medication administration, pain management, and patient education, does the patient require 24 hr/day rehab nursing? Yes Does the patient require coordinated care of a physician, rehab nurse, PT, OT to address physical and functional deficits in the context of the above medical diagnosis(es)? Yes Addressing deficits in the following areas: balance, endurance, locomotion, strength, transferring, bowel/bladder control, bathing, dressing, feeding, grooming, toileting, and psychosocial support Can  the patient actively participate in an intensive therapy program of at least 3 hrs of therapy 5 days a week? Yes The potential for patient to make measurable gains while on inpatient rehab is excellent Anticipated functional outcomes upon discharge from inpatient rehab: supervision PT, supervision OT, n/a SLP Estimated rehab length of stay to reach the above functional goals is: 10-12 days Anticipated discharge destination: Home 10. Overall Rehab/Functional Prognosis: excellent   MD Signature: Meredith Staggers, MD, [place] Director Rehabilitation Services 03/01/2022

## 2022-02-26 NOTE — Sedation Documentation (Signed)
750cc from para

## 2022-02-26 NOTE — Progress Notes (Signed)
OT Cancellation Note  Patient Details Name: AVANTE CARNEIRO MRN: [id number] DOB: 1955-10-26   Cancelled Treatment:    Reason Eval/Treat Not Completed: Medical issues which prohibited therapy GI and nurse asking for therapy to hold off at this time with pending CT for abscess. OT to continue to follow and check back for medical appropriateness.  Rennie Plowman, MS Acute Rehabilitation Department Office# [phone]  02/26/2022, 10:06 AM

## 2022-02-26 NOTE — Progress Notes (Signed)
Personal CPAP in room PT refused

## 2022-02-26 NOTE — Procedures (Signed)
Interventional Radiology Procedure Note  Procedure:  CT guided LLQ drain US guided paracentesis  Indication:  LLQ abscess Ascites  Findings: Please refer to procedural dictation for full description.  Complications: None  EBL: < 10 mL  Miachel Roux, MD [phone]

## 2022-02-26 NOTE — Progress Notes (Signed)
Patient ID: Richard [name], male   DOB: December 21, 1955, 66 y.o.   MRN: [id number]    Progress Note   Subjective   Day # 19 CC; new diagnosis of IBD favoring Crohn's, status post exploratory lap, descending and sigmoid resection with transverse colostomy  for sigmoid perforation 02/07/2022  Lovenox on hold Zosyn resumed  Labs today-WBC 4.8/hemoglobin 8.1 status post 4 units yesterday BUN 11/creatinine 0.30  Patient was seen along with Dr. Beau Fanny this morning. No further bleeding overnight, patient does not remember most of the events from yesterday.  Wife at bedside  CT angio yesterday-small bilateral pleural effusions with dependent compressive atelectasis, hepatomegaly without focal liver abnormality contracted gallbladder trace.  Postsurgical changes of left hemicolectomy end ileostomy, mildly edematous loops of bowel at the left lower quadrant staple line difficult to assess, rectal variceal hemorrhage noted, small volume intra-abdominal ascites in the perihepatic and perisplenic spaces gutters and layering of the right lower quadrant there is also a rim-enhancing fluid collection in the left lower quadrant measuring 6.7 x 7.3 x 9.5 consistent with early abscess Exam positive for acute extravasation within the rectum large target vessel originating from a branch of the SMV consistent with venous hemorrhage  Flexible sigmoidoscopy-large amount of clot in the rectum lavage performed with eventual removal of the clot, area of mildly congested mucosa in the rectosigmoid and in the sigmoid, medium sized nonbleeding rectal varices, multiple 10 to 15 mm ulcers found in the mid rectum and distal rectum 1 deep ulcer from the mid rectum extending to the dentate line no active bleeding but stigmata of of recent bleeding were present in some of the ulcer beds-reviewed with hemostatic spray   Objective   Vital signs in last 24 hours: Temp:  [97.4 F (36.3 C)-102.1 F (38.9 C)] 99.4 F (37.4  C) (12/12 0806) Pulse Rate:  [83-146] 83 (12/12 0530) Resp:  [16-31] 18 (12/12 0530) BP: (77-143)/(42-73) 116/65 (12/12 0530) SpO2:  [88 %-100 %] 97 % (12/12 0530) Weight:  [118.4 kg] 118.4 kg (12/12 0500) Last BM Date : 02/25/22 General:    Older white male in NAD, pale Heart: tachy Regular rate and rhythm; no murmurs Lungs: Respirations even and unlabored, lungs CTA bilaterally Abdomen:  Soft, mild tenderness in the lower abdomen, colostomy left mid quadrant producing brown stool.  Bowel sounds present wound dressed Extremities: Some upper extremity edema Neurologic:  Alert and oriented,  grossly normal neurologically. Psych:  Cooperative. Normal mood and affect.  Intake/Output from previous day: 12/11 0701 - 12/12 0700 In: 2261 [P.O.:120; I.V.:19; Blood:1023.8; IV Piggyback:1098.2] Out: 2706 [Urine:2475; Stool:680] Intake/Output this shift: No intake/output data recorded.  Lab Results: Recent Labs    02/25/22 0550 02/25/22 1201 02/25/22 1329 02/25/22 1827 02/26/22 0521  WBC 4.9 5.9  --   --  4.8  HGB 7.7* 8.6* 8.1* 6.1* 8.1*  HCT 24.9* 27.2* 25.8* 19.1* 25.1*  PLT 254 296  --   --  205   BMET Recent Labs    02/24/22 0530 02/25/22 0550 02/26/22 0521  NA 136 138 140  K 4.3 3.8 3.5  CL 101 100 102  CO2 31 32 34*  GLUCOSE 160* 132* 115*  BUN '12 9 11  '$ CREATININE 0.31* 0.34* <0.30*  CALCIUM 7.8* 8.0* 7.8*   LFT Recent Labs    02/26/22 0521  PROT 5.4*  ALBUMIN 2.4*  AST 15  ALT 11  ALKPHOS 84  BILITOT 0.7   PT/INR No results for input(s): "LABPROT", "INR" in the last 72  hours.  Studies/Results: CT ANGIO GI BLEED  Result Date: 02/25/2022 CLINICAL DATA:  Lower GI bleed (Ped 0-17y) Acute significant rectal bleeding past hour - he is s/p sigmoid resection and colostomy for perforation a couple weeks ago EXAM: CTA ABDOMEN AND PELVIS WITHOUT AND WITH CONTRAST TECHNIQUE: Multidetector CT imaging of the abdomen and pelvis was performed using the standard  protocol during bolus administration of intravenous contrast. Multiplanar reconstructed images and MIPs were obtained and reviewed to evaluate the vascular anatomy. RADIATION DOSE REDUCTION: This exam was performed according to the departmental dose-optimization program which includes automated exposure control, adjustment of the mA and/or kV according to patient size and/or use of iterative reconstruction technique. CONTRAST:  155m OMNIPAQUE IOHEXOL 350 MG/ML SOLN COMPARISON:  CT AP, 02/19/2022 and 02/15/2022. FINDINGS: Suboptimal evaluation, secondary to motion degradation. VASCULAR Aorta: Normal caliber aorta without aneurysm, dissection, vasculitis or significant stenosis. Celiac: Patent without evidence of aneurysm, dissection, vasculitis or significant stenosis. SMA: Patent without evidence of aneurysm, dissection, vasculitis or significant stenosis. Renals: Both renal arteries are patent without evidence of aneurysm, dissection, vasculitis, fibromuscular dysplasia or significant stenosis. IMA: Patent without evidence of aneurysm, dissection, vasculitis or significant stenosis. Pelvis: Patent without evidence of aneurysm, dissection, vasculitis or significant stenosis. Proximal Outflow: Bilateral common femoral and visualized portions of the superficial and profunda femoral arteries are patent without evidence of aneurysm, dissection, vasculitis or significant stenosis. Veins: Portosystemic collateral arising off the SMV with rectal varix, and active extravasation within the rectum. Review of the MIP images confirms the above findings. NON-VASCULAR Lower chest: Small volume bilateral pleural effusions with adjacent dependent compressive atelectasis. Hepatobiliary: Hepatomegaly, with the RIGHT hepatic measuring 22 cm. No focal liver abnormality is seen. Contacted gallbladder with trace pericholecystic ascites. No gallstones, gallbladder wall thickening, or biliary dilatation. Pancreas: No pancreatic ductal  dilatation or surrounding inflammatory changes. Spleen: Normal in size without focal abnormality. Adrenals/Urinary Tract: Adrenal glands are unremarkable. Kidneys are normal, without renal calculi, focal lesion, or hydronephrosis. Bladder is unremarkable. Stomach/Bowel: Stomach is within normal limits. Postsurgical changes of LEFT hemicolectomy with end colostomy at the LEFT lower quadrant. Mildly edematous loops of bowel at the LEFT lower quadrant, with the staple line difficult to assess. Rectal variceal hemorrhage, as described above. Rectosigmoid distention of stool, with additional layering hemorrhage. Lymphatic: No enlarged abdominal or pelvic lymph nodes. Reproductive: Prostate is unremarkable. Other: Small fat containing parastomal hernia. Moderate volume intra-abdominal ascites, is seen within the perihepatic and perisplenic spaces, polyclonal gutters and layering within the RIGHT lower quadrant. Additional air-and-fluid containing, rim-enhancing fluid collection at the LEFT lower quadrant measures 6.7 x 7.3 x 9.5 cm (AP by transverse by CC), consistent with early abscess. Musculoskeletal: Obese with [place] abdomen. Moderate-to-moderate body wall edema, greatest within the flanks. No acute or significant osseous findings. IMPRESSION: VASCULAR 1. Examination is POSITIVE for acute extravasation within the rectum, with target vessel originating from a branch of the SMV consistent with venous hemorrhage. NON-VASCULAR 1. Postsurgical changes of hemicolectomy with colostomy at the LEFT lower quadrant. Small fat-containing containing parastomal hernia without evidence of obstruction. 2. Edematous loops of bowel in the LEFT lower quadrant, about the staple line, with adjacent 10 cm a-and-fluid containing, rim enhancing fluid collection consistent with early abscess. 3.  Additional incidental, chronic and senescent findings as above. These results were called by telephone at the time of interpretation on 02/25/2022  at 4:20 pm to provider Dr MRush Landmark who verbally acknowledged these results. JMichaelle Birks MD [facility] Electronically  Signed   By: Michaelle Birks M.D.   On: 02/25/2022 16:50   VAS [place] UPPER EXTREMITY VENOUS DUPLEX  Result Date: 02/25/2022 UPPER VENOUS STUDY  Patient Name:  Richard [name]  Date of Exam:   02/25/2022 Medical Rec #: [id number]       Accession #:    [id number] Date of Birth: 1955-04-30       Patient Gender: M Patient Age:   54 years Exam Location:  [facility] Procedure:      VAS [place] UPPER EXTREMITY VENOUS DUPLEX Referring Phys: Dwyane Dee --------------------------------------------------------------------------------  Indications: Edema Other Indications: IV to LUE [facility]). Limitations: Body habitus and poor ultrasound/tissue interface. Comparison Study: No previous exams Performing Technologist: Jody Hill RVT, RDMS  Examination Guidelines: A complete evaluation includes B-mode imaging, spectral Doppler, color Doppler, and power Doppler as needed of all accessible portions of each vessel. Bilateral testing is considered an integral part of a complete examination. Limited examinations for reoccurring indications may be performed as noted.  Right Findings: +----------+------------+---------+-----------+----------+--------------------+ RIGHT     CompressiblePhasicitySpontaneousProperties      Summary        +----------+------------+---------+-----------+----------+--------------------+ Subclavian               Yes       Yes                   patent by                                                              color/doppler     +----------+------------+---------+-----------+----------+--------------------+  Left Findings: +----------+------------+---------+-----------+----------+--------------------+ LEFT      CompressiblePhasicitySpontaneousProperties      Summary         +----------+------------+---------+-----------+----------+--------------------+ IJV           Full       Yes       Yes                                   +----------+------------+---------+-----------+----------+--------------------+ Subclavian    Full       Yes       Yes                                   +----------+------------+---------+-----------+----------+--------------------+ Axillary      Full       Yes       Yes                                   +----------+------------+---------+-----------+----------+--------------------+ Brachial      Full       Yes       Yes                                   +----------+------------+---------+-----------+----------+--------------------+ Radial  patent by color only +----------+------------+---------+-----------+----------+--------------------+ Ulnar                                               patent by color only +----------+------------+---------+-----------+----------+--------------------+ Cephalic      Full                                                       +----------+------------+---------+-----------+----------+--------------------+ Basilic       Full       Yes       Yes                                   +----------+------------+---------+-----------+----------+--------------------+ Ulnar and radial veins not well visualized due to overlying edema. Patent by color only.  Summary:  Right: No evidence of thrombosis in the subclavian.  Left: Difficult exam due to poor ultrasound/ tissue interface and diffuse subcutaneous edema of forearm. No obvious evidence of deep vein thrombosis in the upper extremity in areas visualized. No obvious evidence of superficial vein thrombosis in the upper extremity in areas visualized.  *See table(s) above for measurements and observations.  Diagnosing physician: Monica Martinez MD Electronically signed by Monica Martinez MD on  02/25/2022 at 1:53:04 PM.    Final        Assessment / Plan:    #40 66 year old white male day #18 status post emergency exploratory lap, sigmoid resection and transverse colostomy 02/07/2022 for perforated sigmoid colon.  Patient had colonoscopy 4 days previous with new diagnosis of what appeared to be severe inflammatory bowel disease favoring Crohn's. Prolonged complicated hospital course with malnutrition, prolonged ileus  TPN discontinued and had been tolerating solid diet Low-dose Reglan for ileus.  #2 acute major GI hemorrhage yesterday from rectum with large amount of bright red blood and clots Patient required a total of 4 units of packed RBCs and hemoglobin stable today in 8 range  CT angiography as above pertinent for finding of venous source for bleeding in the rectum from a branch of the SMV, also noted rectal varices which did not appear to be bleeding new left lower quadrant abscess  Emergent flexible sigmoidoscopy-finding of multiple deep rectal ulcers none actively bleeding well but with stigmata of recent bleeding-treated with Hemospray.  He has not had any further bleeding overnight but is certainly at high risk for rebleeding.  #3 new left lower quadrant abscess-back on Zosyn #4 adult onset diabetes mellitus #5 debilitation  Plan; plan is for IR consultation and drainage of the left lower quadrant abscess today Okay for diet after he returns from IR Continue serial hemoglobins every 6 hours and transfuse to keep hemoglobin in the 8 range given multiple comorbidities.  Keep at least 2 units ahead available Discussed with Dr. Fredrich Romans feels that IV steroids can be started as soon as the abscess is contained/drained and would favor starting within the next 24 hours.  Good no good surgical options for management of the severe IBD in the residual lower colon bending into the rectum and also with new finding of rectal varices.    Principal Problem:    Perforation of colon s/p Hartmann colectomy/colostomy 02/08/2022  Active Problems:   Morbid obesity ([place])   Adult ADHD   Allergic rhinitis   SEVERE OBSTRUCTIVE SLEEP APNEA with AHI 94   Hyperlipidemia   Hearing impairment   Borderline hypertension   Protein-calorie malnutrition, severe   Ileus, postoperative (HCC)   Crohn's disease of colon with complication (HCC)   Type 2 diabetes mellitus with microalbuminuria (HCC)   Anxiety   Hypertension   Colon polyps   Colostomy in place [facility])   Acute blood loss anemia   Rectal bleeding   Abnormal CT scan, gastrointestinal tract     LOS: 18 days   Tommy Minichiello EsterwoodPA-C  02/26/2022, 8:57 AM

## 2022-02-26 NOTE — Sedation Documentation (Signed)
Handoff provided to bedside RN

## 2022-02-26 NOTE — Progress Notes (Signed)
Progress Note    Richard [name]   GYK:[id number]  DOB: [birth year]  DOA: 02/07/2022     18 PCP: [name], MD  Initial CC: abdominal pain  Hospital Course: Richard [name] is a 66 y.o. male with PMH significant for DM2, HTN, HLD, obesity, OSA, hypogonadism, peripheral neuropathy.  11/7, seen in the office by Dr. [name] with complaint of diarrhea for about 3 to 4 months, 6-8 episodes a day, mixed with blood associated with abdominal cramping and progressive weakness.  Stool studies at that time were negative for infectious etiology.  Sed rate was elevated 72, fecal calprotectin was elevated to 3570. 11/20, underwent colonoscopy, found to have diffuse colitis with deep ulcerations throughout the entire colon friable mucosa as well as extensive mucosal edema in the rectum.  He was started on prednisone 40 mg daily.  After colonoscopy, patient developed worsening abdominal pain. 11/23, at home patient had 1 episode of syncope.  Also had a fever.  Abdominal pain worsened and hence came to the ED.   In the ED, he had fever of 102.1 with tachycardia. CT abdomen pelvis showed perforated bowel with moderate free fluid and free air.  Started on empiric IV antibiotics, IV fluid, and was admitted to ICU.  General surgery was consulted 11/24, patient underwent exploratory laparotomy, descending and sigmoid colon resection, transverse colostomy by Dr. [name]. Surgical pathology showed transmural inflammation consistent with IBD. His postop course was complicated by almost 2 weeks of postop ileus which gradually resolved.  In the 2 weeks duration, he had significant fluid and electrolyte imbalance, hypoxia, fever, encephalopathy and physical deconditioning.  He received TPN, with plan to taper on 02/23/2022. He has also been evaluated by therapy and recommended for CIR with evaluation pending.  Interval History:  Patient developed multiple episodes of BRBPR on 12/11. Underwent 4 units PRBC  transfusion and eval with GI for flex sig.  Today undergoing paracentesis and LLQ drain placement for abscess seen on CT on 12/11.  Wife bedside and completely aware of plan. Patient also awake, alert, and understands plan.   Assessment and Plan:  Acute blood loss anemia Anemia of chronic disease - had been stable with Hgb post op; developed BRBPR on 12/11  - ultimately needed 4 units PRBC on 12/11 and Hgb stabilized after flex sig with hemostatic gel placed on ulcers found  - continue trending Hgb for now to ensure stability   LLQ abscess - noted on CTA A/P on 12/11 - patient underwent paracentesis and drain placement with radiology. -Follow-up paracentesis fluid studies - Continue Zosyn for now which was started on 02/25/2022  Acute peritonitis d/t perforated sigmoid colon, POA Sepsis, POA - resolved  S/p exploratory laparotomy, descending and sigmoid colon resection, transverse colostomy - 02/08/22 Resolved postop ileus NG tube out on 12/5.  TPN weaned off starting 02/23/22 -Continue monitoring ostomy output -Completed 5-day course of meropenem and vancomycin due to breakthrough fevers.  Other considered differential would be atelectasis given body habitus and prolonged bedbound status   Edema Moderate Protein Calorie Malnutrition Significant hypoalbuminemia -Certainly multifactorial at this point given significant amount of fluids received during hospitalization along with severe hypoalbuminemia - Some nutrition restored with TPN but suspect he is significantly depleted of protein - Given generalized edema and net positive volume status, would like to see how he tolerates trial of Lasix with albumin - LUE negative duplex (but technically difficult) but continues to have significant edema; trial of compression -Monitor renal function while on Lasix  and albumin (3 day course ordered which will complete on 12/13)  Inflammatory bowel disease Appreciate GIs assistance. Surgical  pathology report showed transmural inflammation consistent with IBD.   Noted a plan from GI to start on Remicade in 4 to 6 weeks - steroids to be started per GI   Postop respiratory insufficiency - Probably due to atelectasis from immobility, postop abdominal distention, third spacing of fluid -Continue weaning oxygen as able - Continue encouraging incentive spirometer - Need to start mobilizing as able.  CIR eval pending   Hypokalemia/hyperkalemia  Hypomagnesemia/hypophosphatemia Acute metabolic alkalosis  His postop course was complicated by significant fluid/electrolyte abnormalities including severe metabolic alkalosis of 7.6 due to high output gastric secretion. Nephrology was consulted.  Acid-base balance has eventually improved Electrolytes being monitored.   Type 2 diabetes mellitus with hyperglycemia A1c 6.6 on 01/23/2022 PTA on metformin, Jardiance, hold off home oral hypoglycemics Currently on sliding scale insulin.   Resolved acute metabolic encephalopathy Mentation is back to baseline.   Impaired mobility/generalized weakness Significant weakness.  Multifactorial: Low-level electrolytes, poor oral intake, dehydration, physical deconditioning. PT OT with fall precautions. CIR evaluation for possible admission to CIR.   H/o Essential HTN lisinopril remains on hold Closely monitor vital signs.   HLD Lipitor remains on hold   Obesity -Body mass index is 39.85 kg/m. Patient has been advised to make an attempt to improve diet and exercise patterns to aid in weight loss.   OSA Baseline CPAP 10 cm H20, followed by Dr. Ander Slade CPAP QSH     Hx of ADHD methylphenidate on hold   Hx of Hypogonadism. Was on outpatient testosterone    Old records reviewed in assessment of this patient   DVT prophylaxis:  Place and maintain sequential compression device Start: 02/08/22 0001 [employer] VTE on hold for 2-3 days post flex sig (performed 12/11)  Code Status:   Code  Status: Full Code  Mobility Assessment (last 72 hours)     Mobility Assessment     Row Name 02/25/22 2000           What is the highest level of mobility based on the progressive mobility assessment? Level 3 (Stands with assist) - Balance while standing  and cannot march in place       Is the above level different from baseline mobility prior to current illness? Yes - Recommend PT order                Barriers to discharge:  Disposition Plan:  Possibly CIR Status is: Inpt  Objective: Blood pressure 109/72, pulse (!) 119, temperature 99.8 F (37.7 C), temperature source Axillary, resp. rate 17, height '5\' 9"'$  (1.753 m), weight 118.4 kg, SpO2 92 %.  Examination:  Physical Exam Constitutional:      General: He is not in acute distress.    Appearance: Normal appearance. He is obese. He is not ill-appearing.  HENT:     Head: Normocephalic and atraumatic.     Mouth/Throat:     Mouth: Mucous membranes are moist.  Eyes:     Extraocular Movements: Extraocular movements intact.  Cardiovascular:     Rate and Rhythm: Normal rate and regular rhythm.  Pulmonary:     Comments: Coarse breath sounds throughout, no wheezing Abdominal:     Comments: Mild distention, obese.  Soft.  Appropriately tender to palpation.  Ostomy bag in place with brown stool noted  Musculoskeletal:        General: Swelling present.     Cervical  back: Normal range of motion and neck supple.     Right lower leg: Edema present.     Left lower leg: Edema present.     Comments: Improved edema throughout after starting Lasix; still significant edema in left upper extremity  Skin:    General: Skin is warm and dry.  Neurological:     General: No focal deficit present.     Mental Status: He is alert.  Psychiatric:        Mood and Affect: Mood normal.        Behavior: Behavior normal.      Consultants:  General surgery  Procedures:    Data Reviewed: Results for orders placed or performed during the  hospital encounter of 02/07/22 (from the past 24 hour(s))  Glucose, capillary     Status: Abnormal   Collection Time: 02/25/22  4:54 PM  Result Value Ref Range   Glucose-Capillary 185 (H) 70 - 99 mg/dL  Hemoglobin and hematocrit, blood     Status: Abnormal   Collection Time: 02/25/22  6:27 PM  Result Value Ref Range   Hemoglobin 6.1 (LL) 13.0 - 17.0 g/dL   HCT 19.1 (L) 39.0 - 52.0 %  Prepare RBC (crossmatch)     Status: None   Collection Time: 02/25/22  8:32 PM  Result Value Ref Range   Order Confirmation      BB SAMPLE OR UNITS ALREADY AVAILABLE Performed at [facility] [address], [place], [place] [address]   Glucose, capillary     Status: Abnormal   Collection Time: 02/25/22  9:09 PM  Result Value Ref Range   Glucose-Capillary 134 (H) 70 - 99 mg/dL  CBC with Differential/Platelet     Status: Abnormal   Collection Time: 02/26/22  5:21 AM  Result Value Ref Range   WBC 4.8 4.0 - 10.5 K/uL   RBC 2.63 (L) 4.22 - 5.81 MIL/uL   Hemoglobin 8.1 (L) 13.0 - 17.0 g/dL   HCT 25.1 (L) 39.0 - 52.0 %   MCV 95.4 80.0 - 100.0 fL   MCH 30.8 26.0 - 34.0 pg   MCHC 32.3 30.0 - 36.0 g/dL   RDW 16.4 (H) 11.5 - 15.5 %   Platelets 205 150 - 400 K/uL   nRBC 0.0 0.0 - 0.2 %   Neutrophils Relative % 77 %   Neutro Abs 3.7 1.7 - 7.7 K/uL   Lymphocytes Relative 10 %   Lymphs Abs 0.5 (L) 0.7 - 4.0 K/uL   Monocytes Relative 8 %   Monocytes Absolute 0.4 0.1 - 1.0 K/uL   Eosinophils Relative 4 %   Eosinophils Absolute 0.2 0.0 - 0.5 K/uL   Basophils Relative 1 %   Basophils Absolute 0.0 0.0 - 0.1 K/uL   Immature Granulocytes 0 %   Abs Immature Granulocytes 0.02 0.00 - 0.07 K/uL  Magnesium     Status: None   Collection Time: 02/26/22  5:21 AM  Result Value Ref Range   Magnesium 1.8 1.7 - 2.4 mg/dL  Phosphorus     Status: None   Collection Time: 02/26/22  5:21 AM  Result Value Ref Range   Phosphorus 3.3 2.5 - 4.6 mg/dL  Comprehensive metabolic panel     Status: Abnormal    Collection Time: 02/26/22  5:21 AM  Result Value Ref Range   Sodium 140 135 - 145 mmol/L   Potassium 3.5 3.5 - 5.1 mmol/L   Chloride 102 98 - 111 mmol/L   CO2 34 (H)  22 - 32 mmol/L   Glucose, Bld 115 (H) 70 - 99 mg/dL   BUN 11 8 - 23 mg/dL   Creatinine, Ser <0.30 (L) 0.61 - 1.24 mg/dL   Calcium 7.8 (L) 8.9 - 10.3 mg/dL   Total Protein 5.4 (L) 6.5 - 8.1 g/dL   Albumin 2.4 (L) 3.5 - 5.0 g/dL   AST 15 15 - 41 U/L   ALT 11 0 - 44 U/L   Alkaline Phosphatase 84 38 - 126 U/L   Total Bilirubin 0.7 0.3 - 1.2 mg/dL   GFR, Estimated NOT CALCULATED >60 mL/min   Anion gap 4 (L) 5 - 15  Glucose, capillary     Status: Abnormal   Collection Time: 02/26/22  7:41 AM  Result Value Ref Range   Glucose-Capillary 118 (H) 70 - 99 mg/dL   Comment 1 Notify RN    Comment 2 Document in Chart   Protime-INR     Status: Abnormal   Collection Time: 02/26/22  8:16 AM  Result Value Ref Range   Prothrombin Time 16.3 (H) 11.4 - 15.2 seconds   INR 1.3 (H) 0.8 - 1.2  Prepare RBC (crossmatch)     Status: None   Collection Time: 02/26/22 11:51 AM  Result Value Ref Range   Order Confirmation      ORDER PROCESSED BY BLOOD BANK Performed at [facility] [place] [address], [place], [place] [address]   Glucose, capillary     Status: Abnormal   Collection Time: 02/26/22 12:58 PM  Result Value Ref Range   Glucose-Capillary 102 (H) 70 - 99 mg/dL   Comment 1 Notify RN    Comment 2 Document in Chart     I have Reviewed nursing notes, Vitals, and Lab results since pt's last encounter. Pertinent lab results : see above I have ordered test including BMP, CBC, Mg I have reviewed the last note from staff over past 24 hours I have discussed pt's care plan and test results with nursing staff, case manager  Time spent: Greater than 50% of the 55 minute visit was spent in counseling/coordination of care for the patient as laid out in the A&P.    LOS: 18 days   Dwyane Dee, MD [facility] 02/26/2022, 2:41 PM

## 2022-02-27 DIAGNOSIS — K650 Generalized (acute) peritonitis: Secondary | ICD-10-CM

## 2022-02-27 DIAGNOSIS — A419 Sepsis, unspecified organism: Secondary | ICD-10-CM | POA: Diagnosis not present

## 2022-02-27 DIAGNOSIS — E1165 Type 2 diabetes mellitus with hyperglycemia: Secondary | ICD-10-CM

## 2022-02-27 DIAGNOSIS — K922 Gastrointestinal hemorrhage, unspecified: Secondary | ICD-10-CM

## 2022-02-27 DIAGNOSIS — K50114 Crohn's disease of large intestine with abscess: Secondary | ICD-10-CM

## 2022-02-27 DIAGNOSIS — K651 Peritoneal abscess: Secondary | ICD-10-CM | POA: Diagnosis present

## 2022-02-27 DIAGNOSIS — R198 Other specified symptoms and signs involving the digestive system and abdomen: Secondary | ICD-10-CM | POA: Diagnosis not present

## 2022-02-27 DIAGNOSIS — K50118 Crohn's disease of large intestine with other complication: Secondary | ICD-10-CM

## 2022-02-27 DIAGNOSIS — G9341 Metabolic encephalopathy: Secondary | ICD-10-CM | POA: Diagnosis not present

## 2022-02-27 DIAGNOSIS — K626 Ulcer of anus and rectum: Secondary | ICD-10-CM | POA: Diagnosis not present

## 2022-02-27 DIAGNOSIS — Z9049 Acquired absence of other specified parts of digestive tract: Secondary | ICD-10-CM | POA: Insufficient documentation

## 2022-02-27 DIAGNOSIS — D62 Acute posthemorrhagic anemia: Secondary | ICD-10-CM | POA: Diagnosis not present

## 2022-02-27 DIAGNOSIS — K529 Noninfective gastroenteritis and colitis, unspecified: Secondary | ICD-10-CM | POA: Diagnosis present

## 2022-02-27 LAB — CBC WITH DIFFERENTIAL/PLATELET
Abs Immature Granulocytes: 0.03 10*3/uL (ref 0.00–0.07)
Basophils Absolute: 0 10*3/uL (ref 0.0–0.1)
Basophils Relative: 0 %
Eosinophils Absolute: 0.3 10*3/uL (ref 0.0–0.5)
Eosinophils Relative: 6 %
HCT: 24.6 % — ABNORMAL LOW (ref 39.0–52.0)
Hemoglobin: 7.8 g/dL — ABNORMAL LOW (ref 13.0–17.0)
Immature Granulocytes: 1 %
Lymphocytes Relative: 10 %
Lymphs Abs: 0.5 10*3/uL — ABNORMAL LOW (ref 0.7–4.0)
MCH: 30.8 pg (ref 26.0–34.0)
MCHC: 31.7 g/dL (ref 30.0–36.0)
MCV: 97.2 fL (ref 80.0–100.0)
Monocytes Absolute: 0.4 10*3/uL (ref 0.1–1.0)
Monocytes Relative: 8 %
Neutro Abs: 4 10*3/uL (ref 1.7–7.7)
Neutrophils Relative %: 75 %
Platelets: 203 10*3/uL (ref 150–400)
RBC: 2.53 MIL/uL — ABNORMAL LOW (ref 4.22–5.81)
RDW: 16.1 % — ABNORMAL HIGH (ref 11.5–15.5)
WBC: 5.3 10*3/uL (ref 4.0–10.5)
nRBC: 0 % (ref 0.0–0.2)

## 2022-02-27 LAB — COMPREHENSIVE METABOLIC PANEL
ALT: 10 U/L (ref 0–44)
AST: 16 U/L (ref 15–41)
Albumin: 2.8 g/dL — ABNORMAL LOW (ref 3.5–5.0)
Alkaline Phosphatase: 80 U/L (ref 38–126)
Anion gap: 6 (ref 5–15)
BUN: 11 mg/dL (ref 8–23)
CO2: 33 mmol/L — ABNORMAL HIGH (ref 22–32)
Calcium: 8 mg/dL — ABNORMAL LOW (ref 8.9–10.3)
Chloride: 101 mmol/L (ref 98–111)
Creatinine, Ser: 0.42 mg/dL — ABNORMAL LOW (ref 0.61–1.24)
GFR, Estimated: >60 mL/min (ref >60)
Glucose, Bld: 187 mg/dL — ABNORMAL HIGH (ref 70–99)
Potassium: 3.5 mmol/L (ref 3.5–5.1)
Sodium: 140 mmol/L (ref 135–145)
Total Bilirubin: 0.4 mg/dL (ref 0.3–1.2)
Total Protein: 5.3 g/dL — ABNORMAL LOW (ref 6.5–8.1)

## 2022-02-27 LAB — GLUCOSE, CAPILLARY
Glucose-Capillary: 191 mg/dL — ABNORMAL HIGH (ref 70–99)
Glucose-Capillary: 163 mg/dL — ABNORMAL HIGH (ref 70–99)
Glucose-Capillary: 182 mg/dL — ABNORMAL HIGH (ref 70–99)
Glucose-Capillary: 224 mg/dL — ABNORMAL HIGH (ref 70–99)

## 2022-02-27 LAB — PHOSPHORUS: Phosphorus: 2.7 mg/dL (ref 2.5–4.6)

## 2022-02-27 LAB — MAGNESIUM: Magnesium: 1.6 mg/dL — ABNORMAL LOW (ref 1.7–2.4)

## 2022-02-27 LAB — HEMOGLOBIN AND HEMATOCRIT, BLOOD
HCT: 25.4 % — ABNORMAL LOW (ref 39.0–52.0)
HCT: 26 % — ABNORMAL LOW (ref 39.0–52.0)
Hemoglobin: 8.1 g/dL — ABNORMAL LOW (ref 13.0–17.0)
Hemoglobin: 8.2 g/dL — ABNORMAL LOW (ref 13.0–17.0)

## 2022-02-27 MED ORDER — METRONIDAZOLE 500 MG/100ML IV SOLN
500.0000 mg | Freq: Two times a day (BID) | INTRAVENOUS | Status: DC
  Administered 2022-02-27 – 2022-02-28 (×2): 500 mg via INTRAVENOUS
  Filled 2022-02-27 (×2): qty 100

## 2022-02-27 MED ORDER — FERROUS SULFATE 325 (65 FE) MG PO TABS
325.0000 mg | ORAL_TABLET | Freq: Two times a day (BID) | ORAL | Status: DC
  Administered 2022-02-27 – 2022-03-01 (×5): 325 mg via ORAL
  Filled 2022-02-27 (×5): qty 1

## 2022-02-27 MED ORDER — SODIUM CHLORIDE 0.9 % IV SOLN
INTRAVENOUS | Status: AC
  Filled 2022-02-27: qty 500

## 2022-02-27 MED ORDER — METHYLPREDNISOLONE SODIUM SUCC 40 MG IJ SOLR
40.0000 mg | Freq: Every day | INTRAMUSCULAR | Status: DC
  Administered 2022-02-27 – 2022-03-01 (×3): 40 mg via INTRAVENOUS
  Filled 2022-02-27 (×3): qty 1

## 2022-02-27 MED ORDER — MAGNESIUM SULFATE 2 GM/50ML IV SOLN
2.0000 g | Freq: Once | INTRAVENOUS | Status: AC
  Administered 2022-02-27: 2 g via INTRAVENOUS
  Filled 2022-02-27: qty 50

## 2022-02-27 NOTE — Progress Notes (Signed)
Progress Note  2 Days Post-Op  Subjective: S/p IR drain placement yesterday. Per pt's wife, GI wanting to hold off on steroids right now. Pt is tolerating diet and having ostomy output.   Objective: Vital signs in last 24 hours: Temp:  [97.9 F (36.6 C)-100.5 F (38.1 C)] 98.9 F (37.2 C) (12/13 0822) Pulse Rate:  [101-119] 104 (12/13 0600) Resp:  [17-30] 24 (12/13 0600) BP: (97-124)/(46-95) 112/95 (12/13 0600) SpO2:  [89 %-100 %] 96 % (12/13 0600) Weight:  [114.3 kg-115.9 kg] 114.3 kg (12/13 0447) Last BM Date : 02/25/22  Intake/Output from previous day: 12/12 0701 - 12/13 0700 In: 885.5 [P.O.:237; I.V.:25; IV Piggyback:618.5] Out: 5732 [Urine:3300; Drains:470; KGURK:2706] Intake/Output this shift: No intake/output data recorded.  PE: General: pleasant, WD, obese male who is laying in bed in NAD Heart: regular, rate, and rhythm.   Lungs: Respiratory effort nonlabored Abd: soft, NT, mild distention, ostomy productive of stool, some ulceration on stoma but mostly viable and stoma is patent, midline wound clean with small amount fibrinous exudate centrally, drain in LLQ with purulent drainage MS: edema of LUE    Lab Results:  Recent Labs    02/26/22 0521 02/26/22 1606 02/27/22 0225 02/27/22 0812  WBC 4.8  --  5.3  --   HGB 8.1*   < > 7.8* 8.1*  HCT 25.1*   < > 24.6* 25.4*  PLT 205  --  203  --    < > = values in this interval not displayed.    BMET Recent Labs    02/26/22 0521 02/27/22 0225  NA 140 140  K 3.5 3.5  CL 102 101  CO2 34* 33*  GLUCOSE 115* 187*  BUN 11 11  CREATININE <0.30* 0.42*  CALCIUM 7.8* 8.0*    PT/INR Recent Labs    02/26/22 0816  LABPROT 16.3*  INR 1.3*   CMP     Component Value Date/Time   NA 140 02/27/2022 0225   K 3.5 02/27/2022 0225   CL 101 02/27/2022 0225   CO2 33 (H) 02/27/2022 0225   GLUCOSE 187 (H) 02/27/2022 0225   BUN 11 02/27/2022 0225   CREATININE 0.42 (L) 02/27/2022 0225   CREATININE 0.63 (L)  03/10/2019 1436   CALCIUM 8.0 (L) 02/27/2022 0225   PROT 5.3 (L) 02/27/2022 0225   ALBUMIN 2.8 (L) 02/27/2022 0225   AST 16 02/27/2022 0225   ALT 10 02/27/2022 0225   ALKPHOS 80 02/27/2022 0225   BILITOT 0.4 02/27/2022 0225   GFRNONAA >60 02/27/2022 0225   Lipase  No results found for: "LIPASE"     Studies/Results: CT US GUIDED BIOPSY  Result Date: 02/26/2022 INDICATION: 66 year old gentleman with left lower quadrant abscess and right abdominal ascites presents to IR for paracentesis. EXAM: ULTRASOUND GUIDED  PARACENTESIS MEDICATIONS: None. COMPLICATIONS: None immediate. PROCEDURE: Informed written consent was obtained from the patient after a discussion of the risks, benefits and alternatives to treatment. A timeout was performed prior to the initiation of the procedure. Initial ultrasound scanning demonstrates a large amount of ascites within the right lower abdominal quadrant. The right lower abdomen was prepped and draped in the usual sterile fashion. 1% lidocaine was used for local anesthesia. Following this, a 19 gauge, 7-cm, Yueh catheter was introduced. An ultrasound image was saved for documentation purposes. The paracentesis was performed. The catheter was removed and a dressing was applied. The patient tolerated the procedure well without immediate post procedural complication. FINDINGS: A total of approximately 750 mL of  straw-colored fluid was removed. IMPRESSION: Ultrasound-guided paracentesis yielding 750 mL of clear straw-colored peritoneal fluid. Samples were sent for Gram stain and culture. Electronically Signed   By: Miachel Roux M.D.   On: 02/26/2022 13:55   CT GUIDED PERITONEAL/RETROPERITONEAL FLUID DRAIN BY PERC CATH  Result Date: 02/26/2022 INDICATION: 66 year old gentleman with history of inflammatory bowel disease recently underwent exploratory laparotomy with descending and sigmoid colectomy. Patient has had intermittent fevers and recent CT shows left lower  quadrant rim enhancing collection suspicious for abscess. EXAM: CT-guided left lower quadrant abdominal abscess drain placement TECHNIQUE: Multidetector CT imaging of the abdomen was performed following the standard protocol without IV contrast. RADIATION DOSE REDUCTION: This exam was performed according to the departmental dose-optimization program which includes automated exposure control, adjustment of the mA and/or kV according to patient size and/or use of iterative reconstruction technique. MEDICATIONS: The patient is currently admitted to the hospital and receiving intravenous antibiotics. The antibiotics were administered within an appropriate time frame prior to the initiation of the procedure. ANESTHESIA/SEDATION: Moderate (conscious) sedation was employed during this procedure. A total of Versed 1 mg and Fentanyl 75 mcg was administered intravenously by the radiology nurse. Total intra-service moderate Sedation Time: 37 minutes. The patient's level of consciousness and vital signs were monitored continuously by radiology nursing throughout the procedure under my direct supervision. COMPLICATIONS: None immediate. PROCEDURE: Informed written consent was obtained from the patient after a thorough discussion of the procedural risks, benefits and alternatives. All questions were addressed. Maximal Sterile Barrier Technique was utilized including caps, mask, sterile gowns, sterile gloves, sterile drape, hand hygiene and skin antiseptic. A timeout was performed prior to the initiation of the procedure. Patient positioned supine on the procedure table. The left lower quadrant abdominal skin prepped and draped usual fashion. Following local lidocaine administration, the left lower quadrant fluid collection was entered with a 19 gauge Yueh needle utilizing CT guidance. The 19 gauge Yueh catheter was removed over 0.035 inch guidewire and replaced with 10.2 [place] multipurpose pigtail drain. 5 mL parent sample was  aspirated and sent for Gram stain and culture. Post placement CT confirmed appropriate positioning of the abscess drain. The drain was secured to skin with suture and connected to bulb suction. IMPRESSION: CT-guided left lower quadrant abdominal 10 French abscess drain placement. Electronically Signed   By: Miachel Roux M.D.   On: 02/26/2022 13:52   CT ANGIO GI BLEED  Result Date: 02/25/2022 CLINICAL DATA:  Lower GI bleed (Ped 0-17y) Acute significant rectal bleeding past hour - he is s/p sigmoid resection and colostomy for perforation a couple weeks ago EXAM: CTA ABDOMEN AND PELVIS WITHOUT AND WITH CONTRAST TECHNIQUE: Multidetector CT imaging of the abdomen and pelvis was performed using the standard protocol during bolus administration of intravenous contrast. Multiplanar reconstructed images and MIPs were obtained and reviewed to evaluate the vascular anatomy. RADIATION DOSE REDUCTION: This exam was performed according to the departmental dose-optimization program which includes automated exposure control, adjustment of the mA and/or kV according to patient size and/or use of iterative reconstruction technique. CONTRAST:  123m OMNIPAQUE IOHEXOL 350 MG/ML SOLN COMPARISON:  CT AP, 02/19/2022 and 02/15/2022. FINDINGS: Suboptimal evaluation, secondary to motion degradation. VASCULAR Aorta: Normal caliber aorta without aneurysm, dissection, vasculitis or significant stenosis. Celiac: Patent without evidence of aneurysm, dissection, vasculitis or significant stenosis. SMA: Patent without evidence of aneurysm, dissection, vasculitis or significant stenosis. Renals: Both renal arteries are patent without evidence of aneurysm, dissection, vasculitis, fibromuscular dysplasia or significant stenosis. IMA: Patent without  evidence of aneurysm, dissection, vasculitis or significant stenosis. Pelvis: Patent without evidence of aneurysm, dissection, vasculitis or significant stenosis. Proximal Outflow: Bilateral common  femoral and visualized portions of the superficial and profunda femoral arteries are patent without evidence of aneurysm, dissection, vasculitis or significant stenosis. Veins: Portosystemic collateral arising off the SMV with rectal varix, and active extravasation within the rectum. Review of the MIP images confirms the above findings. NON-VASCULAR Lower chest: Small volume bilateral pleural effusions with adjacent dependent compressive atelectasis. Hepatobiliary: Hepatomegaly, with the RIGHT hepatic measuring 22 cm. No focal liver abnormality is seen. Contacted gallbladder with trace pericholecystic ascites. No gallstones, gallbladder wall thickening, or biliary dilatation. Pancreas: No pancreatic ductal dilatation or surrounding inflammatory changes. Spleen: Normal in size without focal abnormality. Adrenals/Urinary Tract: Adrenal glands are unremarkable. Kidneys are normal, without renal calculi, focal lesion, or hydronephrosis. Bladder is unremarkable. Stomach/Bowel: Stomach is within normal limits. Postsurgical changes of LEFT hemicolectomy with end colostomy at the LEFT lower quadrant. Mildly edematous loops of bowel at the LEFT lower quadrant, with the staple line difficult to assess. Rectal variceal hemorrhage, as described above. Rectosigmoid distention of stool, with additional layering hemorrhage. Lymphatic: No enlarged abdominal or pelvic lymph nodes. Reproductive: Prostate is unremarkable. Other: Small fat containing parastomal hernia. Moderate volume intra-abdominal ascites, is seen within the perihepatic and perisplenic spaces, polyclonal gutters and layering within the RIGHT lower quadrant. Additional air-and-fluid containing, rim-enhancing fluid collection at the LEFT lower quadrant measures 6.7 x 7.3 x 9.5 cm (AP by transverse by CC), consistent with early abscess. Musculoskeletal: Obese with [place] abdomen. Moderate-to-moderate body wall edema, greatest within the flanks. No acute or significant  osseous findings. IMPRESSION: VASCULAR 1. Examination is POSITIVE for acute extravasation within the rectum, with target vessel originating from a branch of the SMV consistent with venous hemorrhage. NON-VASCULAR 1. Postsurgical changes of hemicolectomy with colostomy at the LEFT lower quadrant. Small fat-containing containing parastomal hernia without evidence of obstruction. 2. Edematous loops of bowel in the LEFT lower quadrant, about the staple line, with adjacent 10 cm a-and-fluid containing, rim enhancing fluid collection consistent with early abscess. 3.  Additional incidental, chronic and senescent findings as above. These results were called by telephone at the time of interpretation on 02/25/2022 at 4:20 pm to provider Dr Rush Landmark, who verbally acknowledged these results. Michaelle Birks, MD [facility] Radiology Electronically Signed   By: Michaelle Birks M.D.   On: 02/25/2022 16:50   VAS [place] UPPER EXTREMITY VENOUS DUPLEX  Result Date: 02/25/2022 UPPER VENOUS STUDY  Patient Name:  Richard [name]  Date of Exam:   02/25/2022 Medical Rec #: [id number]       Accession #:    [id number] Date of Birth: 1955-07-11       Patient Gender: M Patient Age:   66 years Exam Location:  [facility] Procedure:      VAS [place] UPPER EXTREMITY VENOUS DUPLEX Referring Phys: Dwyane Dee --------------------------------------------------------------------------------  Indications: Edema Other Indications: IV to LUE [facility]). Limitations: Body habitus and poor ultrasound/tissue interface. Comparison Study: No previous exams Performing Technologist: Jody Hill RVT, RDMS  Examination Guidelines: A complete evaluation includes B-mode imaging, spectral Doppler, color Doppler, and power Doppler as needed of all accessible portions of each vessel. Bilateral testing is considered an integral part of a complete examination. Limited examinations for reoccurring indications may be performed  as noted.  Right Findings: +----------+------------+---------+-----------+----------+--------------------+ RIGHT     CompressiblePhasicitySpontaneousProperties      Summary        +----------+------------+---------+-----------+----------+--------------------+  Subclavian               Yes       Yes                   patent by                                                              color/doppler     +----------+------------+---------+-----------+----------+--------------------+  Left Findings: +----------+------------+---------+-----------+----------+--------------------+ LEFT      CompressiblePhasicitySpontaneousProperties      Summary        +----------+------------+---------+-----------+----------+--------------------+ IJV           Full       Yes       Yes                                   +----------+------------+---------+-----------+----------+--------------------+ Subclavian    Full       Yes       Yes                                   +----------+------------+---------+-----------+----------+--------------------+ Axillary      Full       Yes       Yes                                   +----------+------------+---------+-----------+----------+--------------------+ Brachial      Full       Yes       Yes                                   +----------+------------+---------+-----------+----------+--------------------+ Radial                                              patent by color only +----------+------------+---------+-----------+----------+--------------------+ Ulnar                                               patent by color only +----------+------------+---------+-----------+----------+--------------------+ Cephalic      Full                                                       +----------+------------+---------+-----------+----------+--------------------+ Basilic       Full       Yes       Yes                                    +----------+------------+---------+-----------+----------+--------------------+ Ulnar and radial veins not well visualized due to overlying edema. Patent by color only.  Summary:  Right: No evidence of thrombosis in the subclavian.  Left: Difficult exam due to poor ultrasound/ tissue interface and diffuse subcutaneous edema of forearm. No obvious evidence of deep vein thrombosis in the upper extremity in areas visualized. No obvious evidence of superficial vein thrombosis in the upper extremity in areas visualized.  *See table(s) above for measurements and observations.  Diagnosing physician: Monica Martinez MD Electronically signed by Monica Martinez MD on 02/25/2022 at 1:53:04 PM.    Final     Anti-infectives: Anti-infectives (From admission, onward)    Start     Dose/Rate Route Frequency Ordered Stop   02/25/22 2000  piperacillin-tazobactam (ZOSYN) IVPB 3.375 g        3.375 g 12.5 mL/hr over 240 Minutes Intravenous Every 8 hours 02/25/22 1928     02/15/22 1400  meropenem (MERREM) 1 g in sodium chloride 0.9 % 100 mL IVPB  Status:  Discontinued        1 g 200 mL/hr over 30 Minutes Intravenous Every 8 hours 02/15/22 1244 02/19/22 1709   02/15/22 1400  vancomycin (VANCOCIN) IVPB 1000 mg/200 mL premix  Status:  Discontinued        1,000 mg 200 mL/hr over 60 Minutes Intravenous Every 12 hours 02/15/22 1304 02/18/22 1445   02/08/22 1200  piperacillin-tazobactam (ZOSYN) IVPB 3.375 g  Status:  Discontinued        3.375 g 12.5 mL/hr over 240 Minutes Intravenous Every 8 hours 02/08/22 0505 02/15/22 1243   02/08/22 0500  piperacillin-tazobactam (ZOSYN) IVPB 3.375 g  Status:  Discontinued        3.375 g 12.5 mL/hr over 240 Minutes Intravenous Every 8 hours 02/08/22 0006 02/08/22 0505   02/08/22 0334  piperacillin-tazobactam (ZOSYN) 3.375 GM/50ML IVPB       Note to Pharmacy: Enis Gash W: cabinet override      02/08/22 0334 02/08/22 0345   02/07/22 2030  ceFEPIme (MAXIPIME) 2 g in  sodium chloride 0.9 % 100 mL IVPB        2 g 200 mL/hr over 30 Minutes Intravenous  Once 02/07/22 2023 02/07/22 2248   02/07/22 2030  metroNIDAZOLE (FLAGYL) IVPB 500 mg        500 mg 100 mL/hr over 60 Minutes Intravenous  Once 02/07/22 2023 02/08/22 0506   02/07/22 2030  vancomycin (VANCOCIN) IVPB 1000 mg/200 mL premix        1,000 mg 200 mL/hr over 60 Minutes Intravenous  Once 02/07/22 2023 02/08/22 0506        Assessment/Plan  Sigmoid perforation  POD20 S/P exploratory laparotomy, colon resection, end colostomy 02/07/22 Dr. [name] - having ostomy output (1100 cc/24h) - on bid fibercon, add iron  - CT 12/1 with some free fluid but no organized collection, CT 12/5 with no major changes - BRBPR 12/11 - s/p flex-sig which showed bleeding ulcerations. CTA also showed LLQ abscess - hgb stable this AM, s/p 4 units PRBC 12/11 - s/p IR drain placement and paracentesis 12/12 - Cxs pending, continue zosyn for now - ultimately needs medical management for Crohn's otherwise likely to continue to have rectal bleeding, hopefully with source control of infection would be able to initiate steroids as well for acute flare   FEN: reg diet; added iron supplement for ostomy output and anemia  VTE: LMWH on hold  ID: vanc/flagyl/cefepime 11/23; zosyn 11/24>11/29; vanc 12/1>12/4; merrem 12/1> 12/5; Zosyn 12/11>>   - below per TRH -  Post-operative respiratory insufficiency T2DM Physical deconditioning  Acute metabolic encephalopathy,  improving  HTN HLD Obesity class I OSA Hypogonadism Moderate protein calorie malnutrition  ADHD/Anxiety    LOS: 19 days     Norm Parcel, [facility] Surgery 02/27/2022, 8:26 AM Please see Amion for pager number during day hours 7:00am-4:30pm

## 2022-02-27 NOTE — Progress Notes (Signed)
Occupational Therapy Treatment Patient Details Name: AGRON SWINEY MRN: [id number] DOB: [birth year] Today's Date: 02/27/2022   History of present illness Patient is a 66 year old male who was admitted on 11/23 with bowel preforation. patient underwent exploratogy lap, sigmoid resection and transverse colostomy. PMH: IBD, crohn's colitis,COVID July 2022, HLD, HTN, OSA, PNA, Seborrheic dermatitis, Hypogonadism, DM type 2, and peripheral neuopathy. s/p drain placement 12/11 for abscess,  paracentesis 12/11. Patient developed multiple episodes of BRBPR on 12/11. Underwent 4 units PRBC transfusion and eval with GI for flex sig.   OT comments  Patient presents with slight improvement of depressed affect and slightly more verbal. He had a couple medical setbacks since prior treatment. He was min guard to transfer to edge of bed and mod x 2 to stand once with walker and twice in stedy. He was total via stedy to transfer to the chair. He fatigued quickly with standing and stedy use. He performed UB exercises with OT and LE exercises with PT. He was provided with encouragement and education in regards to the need to perform exercises and movement in chair and bed to improve strength and mobility. Patient and wife are still interested in CIR due to patient's goal to return home to work. He was able to tolerate more therapy today. Will continue POC.   Recommendations for follow up therapy are one component of a multi-disciplinary discharge planning process, led by the attending physician.  Recommendations may be updated based on patient status, additional functional criteria and insurance authorization.    Follow Up Recommendations  Acute inpatient rehab (3hours/day)     Assistance Recommended at Discharge Frequent or constant Supervision/Assistance  Patient can return home with the following  Two people to help with walking and/or transfers;A lot of help with bathing/dressing/bathroom;Assistance with  cooking/housework;Direct supervision/assist for medications management;Assist for transportation;Direct supervision/assist for financial management;Help with stairs or ramp for entrance   Equipment Recommendations  None recommended by OT    Recommendations for Other Services      Precautions / Restrictions Precautions Precaution Comments: L colostomy, monitor o2/HR Restrictions Weight Bearing Restrictions: No       Mobility Bed Mobility Overal bed mobility: Needs Assistance Bed Mobility: Supine to Sit Rolling: Min guard Sidelying to sit: Min guard, HOB elevated       General bed mobility comments: min guard, increased time and use of bed rail to transfer into sitting.    Transfers Overall transfer level: Needs assistance Equipment used: Rolling walker (2 wheels) Transfers: Sit to/from Stand, Bed to chair/wheelchair/BSC Sit to Stand: Mod assist, From elevated surface, +2 physical assistance           General transfer comment: mod x 2 to stand from elevated bed height with walker but sat withon 3-5 seconds. Use of stedy to stand from bed to transfer to chair. Mod x 2 to stand with stedy. Transfer via Lift Equipment: Stedy   Balance Overall balance assessment: Needs assistance Sitting-balance support: No upper extremity supported, Feet supported Sitting balance-Leahy Scale: Fair     Standing balance support: During functional activity, Reliant on assistive device for balance Standing balance-Leahy Scale: Poor                             ADL either performed or assessed with clinical judgement   ADL  Extremity/Trunk Assessment Upper Extremity Assessment Upper Extremity Assessment: Generalized weakness   Lower Extremity Assessment Lower Extremity Assessment: Generalized weakness   Cervical / Trunk Assessment Cervical / Trunk Exceptions: colostomy    Vision Baseline Vision/History: 1  Wears glasses Patient Visual Report: No change from baseline     Perception     Praxis      Cognition Arousal/Alertness: Awake/alert Behavior During Therapy: Flat affect Overall Cognitive Status: Within Functional Limits for tasks assessed                                 General Comments: Slight improvement in affect.        Exercises Other Exercises Other Exercises: Bicep flexion/extension x 10 each arm with green band    Shoulder Instructions       General Comments      Pertinent Vitals/ Pain       Pain Assessment Pain Assessment: No/denies pain  Home Living                                          Prior Functioning/Environment              Frequency  Min 2X/week        Progress Toward Goals  OT Goals(current goals can now be found in the care plan section)  Progress towards OT goals: Progressing toward goals  Acute Rehab OT Goals Patient Stated Goal: to return to work OT Goal Formulation: With patient/family Time For Goal Achievement: 03/13/22 Potential to Achieve Goals: Good  Plan Discharge plan remains appropriate    Co-evaluation    PT/OT/SLP Co-Evaluation/Treatment: Yes Reason for Co-Treatment: To address functional/ADL transfers;Complexity of the patient's impairments (multi-system involvement);For patient/therapist safety   OT goals addressed during session: Strengthening/ROM      AM-PAC OT "6 Clicks" Daily Activity     Outcome Measure   Help from another person eating meals?: None Help from another person taking care of personal grooming?: A Little Help from another person toileting, which includes using toliet, bedpan, or urinal?: Total (colostomy, external catheter) Help from another person bathing (including washing, rinsing, drying)?: A Lot Help from another person to put on and taking off regular upper body clothing?: A Little Help from another person to put on and taking off regular lower  body clothing?: Total 6 Click Score: 14    End of Session Equipment Utilized During Treatment: Other (comment) (stedy)  OT Visit Diagnosis: Unsteadiness on feet (R26.81);Other abnormalities of gait and mobility (R26.89);Pain;Muscle weakness (generalized) (M62.81)   Activity Tolerance Patient limited by fatigue   Patient Left in chair;with call bell/phone within reach;with family/visitor present   Nurse Communication Mobility status;Need for lift equipment        Time: 4098-1191 OT Time Calculation (min): 39 min  Charges: OT General Charges $OT Visit: 1 Visit OT Treatments $Therapeutic Exercise: 8-22 mins  Gustavo Lah, OTR/L [place]  Office [phone]   Lenward Chancellor 02/27/2022, 12:19 PM

## 2022-02-27 NOTE — Progress Notes (Signed)
Patient ID: Richard [name], male   DOB: Jul 04, 1955, 66 y.o.   MRN: [id number]    Progress Note   Subjective  Day # 20 CC; new diagnosis of IBD favoring Crohn's, status post emergent exploratory lap/descending and sigmoid resection with transverse colostomy for sigmoid perforation 02/07/2022  Onset GI bleeding 02/25/2022-flex-multiple deep rectal ulcerations several with stigmata of recent bleeding, treated with Hemospray, rectal varices noted  Tmax 100.5 IV Zosyn Lovenox discontinued  Status post IR drain placement into left lower quadrant abscess yesterday-Gram stain abundant WBCs predominantly PMN moderate gram-negative rods-culture pending  Status post IR paracentesis of 750 cc of fluid in the right upper abdomen-straw-colored-Gram stain WBC present predominant mononuclear no organisms-culture pending  WBC 5.3/hemoglobin 7.8/hematocrit 24.6 drifted since yesterday from 8.4 BUN 11/creatinine 0.42/albumin 2.8 LFTs normal Magnesium 1.6  Patient in better spirits today, no complaints of pain able to tolerate solid diet no nausea   Objective   Vital signs in last 24 hours: Temp:  [97.9 F (36.6 C)-100.5 F (38.1 C)] 98.9 F (37.2 C) (12/13 0822) Pulse Rate:  [101-119] 104 (12/13 0600) Resp:  [17-30] 24 (12/13 0600) BP: (97-124)/(46-95) 112/95 (12/13 0600) SpO2:  [89 %-100 %] 96 % (12/13 0600) Weight:  [114.3 kg-115.9 kg] 114.3 kg (12/13 0447) Last BM Date : 02/25/22 General:    Older white male in NAD, wife at bedside Heart:  Regular rate and rhythm; no murmurs Lungs: Respirations even and unlabored, lungs CTA bilaterally Abdomen:  Soft, large, surgical incision dressed, and nondistended. Normal bowel sounds.  Ostomy with liquid brown stool, drain in left lower quadrant with purulent material Extremities:  Without edema. Neurologic:  Alert and oriented,  grossly normal neurologically. Psych:  Cooperative. Normal mood and affect.  Intake/Output from previous day: 12/12 0701  - 12/13 0700 In: 885.5 [P.O.:237; I.V.:25; IV Piggyback:618.5] Out: 8546 [Urine:3300; Drains:470; EVOJJ:0093] Intake/Output this shift: No intake/output data recorded.  Lab Results: Recent Labs    02/25/22 1201 02/25/22 1329 02/26/22 0521 02/26/22 1606 02/26/22 2013 02/27/22 0225 02/27/22 0812  WBC 5.9  --  4.8  --   --  5.3  --   HGB 8.6*   < > 8.1*   < > 8.3* 7.8* 8.1*  HCT 27.2*   < > 25.1*   < > 25.6* 24.6* 25.4*  PLT 296  --  205  --   --  203  --    < > = values in this interval not displayed.   BMET Recent Labs    02/25/22 0550 02/26/22 0521 02/27/22 0225  NA 138 140 140  K 3.8 3.5 3.5  CL 100 102 101  CO2 32 34* 33*  GLUCOSE 132* 115* 187*  BUN '9 11 11  '$ CREATININE 0.34* <0.30* 0.42*  CALCIUM 8.0* 7.8* 8.0*   LFT Recent Labs    02/27/22 0225  PROT 5.3*  ALBUMIN 2.8*  AST 16  ALT 10  ALKPHOS 80  BILITOT 0.4   PT/INR Recent Labs    02/26/22 0816  LABPROT 16.3*  INR 1.3*    Studies/Results: CT US GUIDED BIOPSY  Result Date: 02/26/2022 INDICATION: 66 year old gentleman with left lower quadrant abscess and right abdominal ascites presents to IR for paracentesis. EXAM: ULTRASOUND GUIDED  PARACENTESIS MEDICATIONS: None. COMPLICATIONS: None immediate. PROCEDURE: Informed written consent was obtained from the patient after a discussion of the risks, benefits and alternatives to treatment. A timeout was performed prior to the initiation of the procedure. Initial ultrasound scanning demonstrates a large amount of ascites  within the right lower abdominal quadrant. The right lower abdomen was prepped and draped in the usual sterile fashion. 1% lidocaine was used for local anesthesia. Following this, a 19 gauge, 7-cm, Yueh catheter was introduced. An ultrasound image was saved for documentation purposes. The paracentesis was performed. The catheter was removed and a dressing was applied. The patient tolerated the procedure well without immediate post procedural  complication. FINDINGS: A total of approximately 750 mL of straw-colored fluid was removed. IMPRESSION: Ultrasound-guided paracentesis yielding 750 mL of clear straw-colored peritoneal fluid. Samples were sent for Gram stain and culture. Electronically Signed   By: Miachel Roux M.D.   On: 02/26/2022 13:55   CT GUIDED PERITONEAL/RETROPERITONEAL FLUID DRAIN BY PERC CATH  Result Date: 02/26/2022 INDICATION: 66 year old gentleman with history of inflammatory bowel disease recently underwent exploratory laparotomy with descending and sigmoid colectomy. Patient has had intermittent fevers and recent CT shows left lower quadrant rim enhancing collection suspicious for abscess. EXAM: CT-guided left lower quadrant abdominal abscess drain placement TECHNIQUE: Multidetector CT imaging of the abdomen was performed following the standard protocol without IV contrast. RADIATION DOSE REDUCTION: This exam was performed according to the departmental dose-optimization program which includes automated exposure control, adjustment of the mA and/or kV according to patient size and/or use of iterative reconstruction technique. MEDICATIONS: The patient is currently admitted to the hospital and receiving intravenous antibiotics. The antibiotics were administered within an appropriate time frame prior to the initiation of the procedure. ANESTHESIA/SEDATION: Moderate (conscious) sedation was employed during this procedure. A total of Versed 1 mg and Fentanyl 75 mcg was administered intravenously by the radiology nurse. Total intra-service moderate Sedation Time: 37 minutes. The patient's level of consciousness and vital signs were monitored continuously by radiology nursing throughout the procedure under my direct supervision. COMPLICATIONS: None immediate. PROCEDURE: Informed written consent was obtained from the patient after a thorough discussion of the procedural risks, benefits and alternatives. All questions were addressed.  Maximal Sterile Barrier Technique was utilized including caps, mask, sterile gowns, sterile gloves, sterile drape, hand hygiene and skin antiseptic. A timeout was performed prior to the initiation of the procedure. Patient positioned supine on the procedure table. The left lower quadrant abdominal skin prepped and draped usual fashion. Following local lidocaine administration, the left lower quadrant fluid collection was entered with a 19 gauge Yueh needle utilizing CT guidance. The 19 gauge Yueh catheter was removed over 0.035 inch guidewire and replaced with 10.2 [place] multipurpose pigtail drain. 5 mL parent sample was aspirated and sent for Gram stain and culture. Post placement CT confirmed appropriate positioning of the abscess drain. The drain was secured to skin with suture and connected to bulb suction. IMPRESSION: CT-guided left lower quadrant abdominal 10 French abscess drain placement. Electronically Signed   By: Miachel Roux M.D.   On: 02/26/2022 13:52   CT ANGIO GI BLEED  Result Date: 02/25/2022 CLINICAL DATA:  Lower GI bleed (Ped 0-17y) Acute significant rectal bleeding past hour - he is s/p sigmoid resection and colostomy for perforation a couple weeks ago EXAM: CTA ABDOMEN AND PELVIS WITHOUT AND WITH CONTRAST TECHNIQUE: Multidetector CT imaging of the abdomen and pelvis was performed using the standard protocol during bolus administration of intravenous contrast. Multiplanar reconstructed images and MIPs were obtained and reviewed to evaluate the vascular anatomy. RADIATION DOSE REDUCTION: This exam was performed according to the departmental dose-optimization program which includes automated exposure control, adjustment of the mA and/or kV according to patient size and/or use of iterative reconstruction technique.  CONTRAST:  157m OMNIPAQUE IOHEXOL 350 MG/ML SOLN COMPARISON:  CT AP, 02/19/2022 and 02/15/2022. FINDINGS: Suboptimal evaluation, secondary to motion degradation. VASCULAR Aorta:  Normal caliber aorta without aneurysm, dissection, vasculitis or significant stenosis. Celiac: Patent without evidence of aneurysm, dissection, vasculitis or significant stenosis. SMA: Patent without evidence of aneurysm, dissection, vasculitis or significant stenosis. Renals: Both renal arteries are patent without evidence of aneurysm, dissection, vasculitis, fibromuscular dysplasia or significant stenosis. IMA: Patent without evidence of aneurysm, dissection, vasculitis or significant stenosis. Pelvis: Patent without evidence of aneurysm, dissection, vasculitis or significant stenosis. Proximal Outflow: Bilateral common femoral and visualized portions of the superficial and profunda femoral arteries are patent without evidence of aneurysm, dissection, vasculitis or significant stenosis. Veins: Portosystemic collateral arising off the SMV with rectal varix, and active extravasation within the rectum. Review of the MIP images confirms the above findings. NON-VASCULAR Lower chest: Small volume bilateral pleural effusions with adjacent dependent compressive atelectasis. Hepatobiliary: Hepatomegaly, with the RIGHT hepatic measuring 22 cm. No focal liver abnormality is seen. Contacted gallbladder with trace pericholecystic ascites. No gallstones, gallbladder wall thickening, or biliary dilatation. Pancreas: No pancreatic ductal dilatation or surrounding inflammatory changes. Spleen: Normal in size without focal abnormality. Adrenals/Urinary Tract: Adrenal glands are unremarkable. Kidneys are normal, without renal calculi, focal lesion, or hydronephrosis. Bladder is unremarkable. Stomach/Bowel: Stomach is within normal limits. Postsurgical changes of LEFT hemicolectomy with end colostomy at the LEFT lower quadrant. Mildly edematous loops of bowel at the LEFT lower quadrant, with the staple line difficult to assess. Rectal variceal hemorrhage, as described above. Rectosigmoid distention of stool, with additional layering  hemorrhage. Lymphatic: No enlarged abdominal or pelvic lymph nodes. Reproductive: Prostate is unremarkable. Other: Small fat containing parastomal hernia. Moderate volume intra-abdominal ascites, is seen within the perihepatic and perisplenic spaces, polyclonal gutters and layering within the RIGHT lower quadrant. Additional air-and-fluid containing, rim-enhancing fluid collection at the LEFT lower quadrant measures 6.7 x 7.3 x 9.5 cm (AP by transverse by CC), consistent with early abscess. Musculoskeletal: Obese with [place] abdomen. Moderate-to-moderate body wall edema, greatest within the flanks. No acute or significant osseous findings. IMPRESSION: VASCULAR 1. Examination is POSITIVE for acute extravasation within the rectum, with target vessel originating from a branch of the SMV consistent with venous hemorrhage. NON-VASCULAR 1. Postsurgical changes of hemicolectomy with colostomy at the LEFT lower quadrant. Small fat-containing containing parastomal hernia without evidence of obstruction. 2. Edematous loops of bowel in the LEFT lower quadrant, about the staple line, with adjacent 10 cm a-and-fluid containing, rim enhancing fluid collection consistent with early abscess. 3.  Additional incidental, chronic and senescent findings as above. These results were called by telephone at the time of interpretation on 02/25/2022 at 4:20 pm to provider Dr MRush Landmark who verbally acknowledged these results. JMichaelle Birks MD [facility] Electronically Signed   By: JMichaelle BirksM.D.   On: 02/25/2022 16:50   VAS [place]PPER EXTREMITY VENOUS DUPLEX  Result Date: 02/25/2022 UPPER VENOUS STUDY  Patient Name:  KBRINTON BRANDEL Date of Exam:   02/25/2022 Medical Rec #: [id number]      Accession #:    [id number] of Birth: 1February 28, 1957      Patient Gender: M Patient Age:   89years [facility]      VAS [place]ER EXTREMITY VENOUS DUPLEX  Referring Phys: DDwyane Dee--------------------------------------------------------------------------------  Indications: Edema Other Indications: IV to LUE ([facility]. Limitations: Body habitus and poor ultrasound/tissue interface. Comparison  Study: No previous exams Performing Technologist: Jody Hill RVT, RDMS  Examination Guidelines: A complete evaluation includes B-mode imaging, spectral Doppler, color Doppler, and power Doppler as needed of all accessible portions of each vessel. Bilateral testing is considered an integral part of a complete examination. Limited examinations for reoccurring indications may be performed as noted.  Right Findings: +----------+------------+---------+-----------+----------+--------------------+ RIGHT     CompressiblePhasicitySpontaneousProperties      Summary        +----------+------------+---------+-----------+----------+--------------------+ Subclavian               Yes       Yes                   patent by                                                              color/doppler     +----------+------------+---------+-----------+----------+--------------------+  Left Findings: +----------+------------+---------+-----------+----------+--------------------+ LEFT      CompressiblePhasicitySpontaneousProperties      Summary        +----------+------------+---------+-----------+----------+--------------------+ IJV           Full       Yes       Yes                                   +----------+------------+---------+-----------+----------+--------------------+ Subclavian    Full       Yes       Yes                                   +----------+------------+---------+-----------+----------+--------------------+ Axillary      Full       Yes       Yes                                   +----------+------------+---------+-----------+----------+--------------------+ Brachial      Full       Yes       Yes                                    +----------+------------+---------+-----------+----------+--------------------+ Radial                                              patent by color only +----------+------------+---------+-----------+----------+--------------------+ Ulnar                                               patent by color only +----------+------------+---------+-----------+----------+--------------------+ Cephalic      Full                                                       +----------+------------+---------+-----------+----------+--------------------+  Basilic       Full       Yes       Yes                                   +----------+------------+---------+-----------+----------+--------------------+ Ulnar and radial veins not well visualized due to overlying edema. Patent by color only.  Summary:  Right: No evidence of thrombosis in the subclavian.  Left: Difficult exam due to poor ultrasound/ tissue interface and diffuse subcutaneous edema of forearm. No obvious evidence of deep vein thrombosis in the upper extremity in areas visualized. No obvious evidence of superficial vein thrombosis in the upper extremity in areas visualized.  *See table(s) above for measurements and observations.  Diagnosing physician: Monica Martinez MD Electronically signed by Monica Martinez MD on 02/25/2022 at 1:53:04 PM.    Final        Assessment / Plan:    #55 66 year old white male, day #19 status post emergency exploratory lap, sigmoid resection and transverse colostomy for perforated sigmoid colon.  Patient had colonoscopy 4 days prior to that with new diagnosis of severe inflammatory bowel disease and Crohn's A long hospital course with malnutrition prolonged ileus which has finally resolved  Patient had acute major hemorrhage on 02/25/2022 from the rectum required 4 units of packed RBCs, hemoglobin has been stable since but drifted a bit.   No further active bleeding Anemia multifactorial  No good  surgical option if has acute repeat hemorrhage.  Flexible sigmoidoscopy can be repeated to attempt control of bleeding with Hemospray.    CT angio pertinent for venous source of bleeding in the rectum from a branch of the SMV and also noted rectal varices which did not appear to be the bleeding source as well as a large left lower quadrant abscess. Lexical sigmoidoscopy 02/25/2022 with finding of multiple deep rectal ulcers none actively bleeding but with stigmata-treated with Hemospray  #2 new left lower quadrant abscess status post IR drainage yesterday-purulent material in drain gram stain positive for gram-negative rods, cultures pending-on Zosyn  #3 free peritoneal fluid, status post paracentesis yesterday per IR-straw-colored material, Gram stain no organisms but multiple WBCs, culture pending  Do Not think this represents underlying liver disease or SBP this is persistent fluid post perforation and peritonitis.  #4 adult onset diabetes mellitus #5.  Severe debilitation secondary to above #6 sleep apnea  Plan; start IV Flagyl today for treatment of Crohn's colitis this will offer anti-inflammatory effect-will plan for long course which could then be converted to oral  #2 continue Zosyn #3 continue serial hemoglobins transfuse for hemoglobin 7 or less #4, await cultures #5 Would like to know if the peritoneal fluid is infected, prior to starting steroids, the left lower abscess is controlled at this point with drain in place.  As he does not have any great options if he has repeat major hemorrhage, could start IV Solu-Medrol today, which can certainly be stopped should there be further evidence of active infection within the peritoneal fluid when cultures result.      Principal Problem:   Perforation of colon s/p Hartmann colectomy/colostomy 02/08/2022 Active Problems:   Morbid obesity ([place])   Adult ADHD   Allergic rhinitis   SEVERE OBSTRUCTIVE SLEEP APNEA with AHI 94    Hyperlipidemia   Hearing impairment   Borderline hypertension   Protein-calorie malnutrition, severe   Ileus, postoperative (HCC)   Crohn's disease of colon with  complication ([place])   Type 2 diabetes mellitus with microalbuminuria (HCC)   Anxiety   Hypertension   Colon polyps   Colostomy in place [facility])   Acute blood loss anemia   Rectal bleeding   Abnormal CT scan, gastrointestinal tract   Sepsis with encephalopathy without septic shock (HCC)   History of colectomy   Intra-abdominal abscess (HCC)   Rectal ulceration   Lower GI bleeding   Crohn's disease of colon with abscess ([place])     LOS: 19 days   Jatavion Peaster  PA-C 02/27/2022, 8:46 AM

## 2022-02-27 NOTE — Progress Notes (Signed)
PROGRESS NOTE   Richard [name]  NWG:[id number] DOB: 03-10-1956 DOA: 02/07/2022 PCP: Richard Sou, MD   Date of Service: the patient was seen and examined on 02/27/2022  Brief Narrative:  Richard [name] is a 66 y.o. male with PMH significant for DM2, HTN, HLD, obesity, OSA, hypogonadism, peripheral neuropathy.  11/7, seen in the office by Dr. Bryan [name] with complaint of diarrhea for about 3 to 4 months, 6-8 episodes a day, mixed with blood associated with abdominal cramping and progressive weakness.  Stool studies at that time were negative for infectious etiology.  Sed rate was elevated 72, fecal calprotectin was elevated to 3570. 11/20, underwent colonoscopy, found to have diffuse colitis with deep ulcerations throughout the entire colon friable mucosa as well as extensive mucosal edema in the rectum.  He was started on prednisone 40 mg daily.  After colonoscopy, patient developed worsening abdominal pain. 11/23, at home patient had 1 episode of syncope.  Also had a fever.  Abdominal pain worsened and hence came to the ED.   In the ED, he had fever of 102.1 with tachycardia. CT abdomen pelvis showed perforated bowel with moderate free fluid and free air.  Started on empiric IV antibiotics, IV fluid, and was admitted to ICU.  General surgery was consulted 11/24, patient underwent exploratory laparotomy, descending and sigmoid colon resection, transverse colostomy by Dr. Marcello [name]. Surgical pathology showed transmural inflammation consistent with IBD. His postop course was complicated by almost 2 weeks of postop ileus which gradually resolved.  In the 2 weeks duration, he had significant fluid and electrolyte imbalance, hypoxia, fever, encephalopathy and physical deconditioning.  He received TPN, with plan to taper on 02/23/2022. He has also been evaluated by therapy and recommended for CIR with evaluation pending.   Assessment and Plan: Problem  Acute Generalized Peritonitis  (Hcc)  Secondary to sigmoid colon perforation Status post exploratory laparotomy with descending and sigmoid colon resection and transverse colostomy on 02/08/2022 Continuing intravenous Zosyn Tolerating oral intake Surgery and gastroenterology following, their input is appreciated   Acute Lower GI Bleeding Patient suffered significant lower GI bleeding on 12/11 Patient required 4 unit packed red blood cell transfusion Monitoring for further evidence of bleeding Flex sigmoidoscopy performed on 12/12 revealing 810 to 50 mm ulcerations found in the mid rectum and distal rectum with application of pure stat hemostatic gel to the most deep lesion in the mid rectum. Avoiding anticoagulation Hemoglobin and hematocrit with serial CBCs with transfusion to be initiated for actively bleeding   Left Lower Quadrant Abdominal Abscess (Hcc)  10 cm left lower quadrant abscess identified via CT imaging of the abdomen pelvis on 12/11 Patient is status post IR guided drain placement on 12/12 by Dr. Dwaine [name] JP drain actively exhibiting purulent drainage Continuing intravenous Zosyn Following cultures   Sepsis (Hcc)  Continuing intravenous antibiotic therapy with Zosyn Remainder of assessment and plan as above   Perforated Sigmoid Colon (Hcc)  Status post exploratory laparotomy with descending and sigmoid colon resection and transverse colostomy on 57/84/6962   Acute Metabolic Encephalopathy  Ongoing lethargy secondary likely primarily due to to underlying infection Improving Supportive care, treating underlying conditions   Inflammatory Bowel Disease  Biopsies earlier in the hospitalization consistent with inflammatory bowel disease favorable towards Richard [name] disease Additionally, numerous ulcerations noted on flexible sigmoidoscopy performed on 12/12 Patient currently receiving intravenous Flagyl at the recommendation of gastroenterology for treatment of suspected Richard [name]  colitis Gastroenterology has initiated intravenous Solu-Medrol today which will eventually be transition to oral  therapy and outpatient immunomodulating therapy   Type 2 Diabetes Mellitus With Hyperglycemia, Without Long-Term Current Use of Insulin (Hcc)  Patient been placed on Accu-Cheks before every meal and nightly with sliding scale insulin Holding home regimen of hypoglycemics Hemoglobin A1C ordered Diabetic Diet   Protein-calorie malnutrition, severe  Patient has been Richard [name] patient off of TPN Patient is currently tolerating oral intake Nutrition following, their input is appreciated Ensure supplements 3 times a day between meals                      Subjective:  Patient complaining of severe generalized weakness.  Patient states that he has little to no abdominal pain.  Physical Exam:  Vitals:   02/27/22 1300 02/27/22 1400 02/27/22 1500 02/27/22 1600  BP:      Pulse: (!) 114 (!) 101 (!) 110 (!) 108  Resp: (!) 33 (!) 21 (!) 26 (!) 26  Temp:    99.4 F (37.4 C)  TempSrc:    Oral  SpO2: 94% 96% 93% 93%  Weight:      Height:         Constitutional: Lethargic but is when oriented x 3.  Patient is currently not in any acute distress. Skin: Dressing of the anterior abdominal wall is clean dry and intact.  Poor skin turgor noted.   Eyes: Pupils are equally reactive to light.  No evidence of scleral icterus or conjunctival pallor.  ENMT: Moist mucous membranes noted.  Posterior pharynx clear of any exudate or lesions.   Respiratory: Notable bibasilar rales.  Otherwise, no evidence of wheezing.  Normal respiratory effort. No accessory muscle use.  Cardiovascular: Regular rate and rhythm, no murmurs / rubs / gallops.  Severe lower extremity pitting edema.  Minimal pitting edema to the bilateral upper extremities.  2+ pedal pulses. No carotid bruits.  Abdomen: Notable JP drain coming from the left lower quadrant draining purulent drainage.  Abdomen is soft and  nontender.  No evidence of intra-abdominal masses.  Positive bowel sounds noted in all quadrants.   Musculoskeletal: No joint deformity upper and lower extremities. Good ROM, no contractures. Normal muscle tone.    Data Reviewed:  I have personally reviewed and interpreted labs, imaging.  Significant findings are  CBC: Recent Labs  Lab 02/24/22 0530 02/25/22 0550 02/25/22 1201 02/25/22 1329 02/26/22 0521 02/26/22 1606 02/26/22 2013 02/27/22 0225 02/27/22 0812  WBC 5.2 4.9 5.9  --  4.8  --   --  5.3  --   NEUTROABS 4.0 3.7 4.7  --  3.7  --   --  4.0  --   HGB 8.5* 7.7* 8.6*   < > 8.1* 8.4* 8.3* 7.8* 8.1*  HCT 27.0* 24.9* 27.2*   < > 25.1* 26.0* 25.6* 24.6* 25.4*  MCV 100.4* 102.0* 101.1*  --  95.4  --   --  97.2  --   PLT 294 254 296  --  205  --   --  203  --    < > = values in this interval not displayed.   Basic Metabolic Panel: Recent Labs  Lab 02/23/22 0500 02/24/22 0530 02/25/22 0550 02/26/22 0521 02/27/22 0225  NA 137 136 138 140 140  K 4.1 4.3 3.8 3.5 3.5  CL 102 101 100 102 101  CO2 32 31 32 34* 33*  GLUCOSE 142* 160* 132* 115* 187*  BUN '11 12 9 11 11  '$ CREATININE 0.36* 0.31* 0.34* <0.30* 0.42*  CALCIUM 7.7* 7.8* 8.0*  7.8* 8.0*  MG 1.8 1.7 1.6* 1.8 1.6*  PHOS 2.9 2.7 3.3 3.3 2.7   GFR: Estimated Creatinine Clearance: 114.7 mL/min (A) (by C-G formula based on SCr of 0.42 mg/dL (L)). Liver Function Tests: Recent Labs  Lab 02/22/22 0459 02/24/22 0530 02/25/22 0550 02/26/22 0521 02/27/22 0225  AST '24 24 20 15 16  '$ ALT '15 17 15 11 10  '$ ALKPHOS 93 138* 138* 84 80  BILITOT 0.2* 0.4 0.6 0.7 0.4  PROT 5.3* 5.9* 5.9* 5.4* 5.3*  ALBUMIN <1.5* <1.5* 2.0* 2.4* 2.8*    Coagulation Profile: Recent Labs  Lab 02/26/22 0816  INR 1.3*     EKG/Telemetry: Personally reviewed.  Rhythm is sinus tach with heart rate of 110 bpm.  No dynamic ST segment changes appreciated.   Code Status:  Full code.  Code status decision has been confirmed with: Wife at the  bedside Family Communication: Wife is at the bedside and has been updated on plan of care   Severity of Illness:  The appropriate patient status for this patient is INPATIENT. Inpatient status is judged to be reasonable and necessary in order to provide the required intensity of service to ensure the patient's safety. The patient's presenting symptoms, physical exam findings, and initial radiographic and laboratory data in the context of their chronic comorbidities is felt to place them at high risk for further clinical deterioration. Furthermore, it is not anticipated that the patient will be medically stable for discharge from the hospital within 2 midnights of admission.   * I certify that at the point of admission it is my clinical judgment that the patient will require inpatient hospital care spanning beyond 2 midnights from the point of admission due to high intensity of service, high risk for further deterioration and high frequency of surveillance required.*  Time spent:  66 minutes  Author:  Vernelle Emerald MD  02/27/2022 5:57 PM

## 2022-02-27 NOTE — Progress Notes (Signed)
Pharmacy Antibiotic Note  Richard [name] is a 66 y.o. male admitted on 02/07/2022 with  LLQ abscess .  Pharmacy has been consulted for Zosyn dosing.  ID: IBD, acute sigmoid perforation s/p exp lap, left colectomy and transverse colostomy - 12/5 Stopping abx since CT scan showed no fluid collections - 12/11 CTA LLQ abscess, drain placed 12/12  Vanc 1g/Cef 2g/Flagyl x 1 in ED Zosyn EI 11/24 >>12/1 restart 12/11>> 12/1 meropenem >>12/5 12/1 vancomycin >>12/4  12/1 BCx: ngF 11/30 AFB: neg 11/28 AFB:neg 11/27 AFB neg 11/27 BCx2: ngF 11/25 UCx ngF 11/24 MRSA - 11/24 BCx2 ngF 11/23 UCx  11/29 AFB: neg  Plan: Con't Zosyn 3.375g IV q8hr. CrCl>100 Pharmacy will sign off. Please reconsult for further dosing assitance.     Height: '5\' 9"'$  (175.3 cm) Weight: 114.3 kg (251 lb 15.8 oz) IBW/kg (Calculated) : 70.7  Temp (24hrs), Avg:99.3 F (37.4 C), Min:97.9 F (36.6 C), Max:100.5 F (38.1 C)  Recent Labs  Lab 02/23/22 0500 02/24/22 0530 02/25/22 0550 02/25/22 1201 02/26/22 0521 02/27/22 0225  WBC 5.0 5.2 4.9 5.9 4.8 5.3  CREATININE 0.36* 0.31* 0.34*  --  <0.30* 0.42*    Estimated Creatinine Clearance: 114.7 mL/min (A) (by C-G formula based on SCr of 0.42 mg/dL (L)).    Allergies  Allergen Reactions   Lobster [Shellfish Allergy] Nausea Only   Poison Ivy Extract Itching    [name] S. Alford Highland, PharmD, BCPS Clinical Staff Pharmacist [email or web address]  Wayland Salinas 02/27/2022 10:42 AM

## 2022-02-27 NOTE — Progress Notes (Signed)
Physical Therapy Treatment Patient Details Name: Richard [name] MRN: [id number] DOB: [birth year] Today's Date: 02/27/2022    PT Comments      Pt received supine in bed without pain complaint, BP 131/58, HR110, SpO2`100% on 4L via [place], RR20, in better spirits, pt on RA for majority of session. Pt required min guard with cuing for bed mobility, modA+2 for transfers 1x with RW but pt became fatigued after ~3s and sat heavily back on bed. ModA+2 for sit to stand transfer with STEDY. Total assist for step pivot to chair, required rest break prior to completing transfer to recliner surface onto lift pad. CL275, SpO2 dipped to 88% so resumed O2viaNC on 4L, RR28. Pt completed extremity exercises with cuing and some assistance especially for BLE (OT performing Bue and PT performing BLE exericses). 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 Pt and wife still interested in [place], will continue that recommendation. We will continue to follow acutely.     02/27/22 1220  PT Visit Information  Last PT Received On 02/27/22  Assistance Needed +2  PT/OT/SLP Co-Evaluation/Treatment Yes  Reason for Co-Treatment For patient/therapist safety;To address functional/ADL transfers  PT goals addressed during session Strengthening/ROM;Mobility/safety with mobility  OT goals addressed during session Strengthening/ROM  History of Present Illness Patient is a 66 year old male who was admitted on 11/23 with bowel preforation. patient underwent exploratogy lap, sigmoid resection and transverse colostomy. PMH: IBD, crohn's colitis,COVID July 2022, HLD, HTN, OSA, PNA, Seborrheic dermatitis, Hypogonadism, DM type 2, and peripheral neuopathy. s/p drain placement 12/11 for abscess,  paracentesis 12/11 with 750cc removed. Patient developed multiple episodes of BRBPR on 12/11. Underwent 4 units PRBC transfusion and eval with GI for  flex sig.  Subjective Data  Subjective I feel better today.  Patient Stated Goal to go outside, get out of room.  Precautions  Precautions Other (comment)  Precaution Comments L colostomy, monitor o2/HR  Required Braces or Orthoses Other Brace  Other Brace binder was not in place  Restrictions  Weight Bearing Restrictions No  Pain Assessment  Pain Assessment No/denies pain  Faces Pain Scale 2  Pain Location abd  Pain Descriptors / Indicators Discomfort  Pain Intervention(s) Monitored during session;Repositioned  Cognition  Arousal/Alertness Awake/alert  Behavior During Therapy Flat affect  Overall Cognitive Status Within Functional Limits for tasks assessed  General Comments Slight improvement in affect, less flat, but not joking like previous session  Bed Mobility  Overal bed mobility Needs Assistance  Bed Mobility Supine to Sit  Rolling Min guard  Sidelying to sit Min guard;HOB elevated  General bed mobility comments min guard, increased time, cuing, pt utilizing LUE to assist LLE and use of bed rail to transfer into sitting.  Transfers  Overall transfer level Needs assistance  Equipment used Rolling walker (2 wheels)  Transfers Sit to/from Stand;Bed to chair/wheelchair/BSC  Sit to Stand Mod assist;From elevated surface;+2 physical assistance  Bed to/from chair/wheelchair/BSC transfer type: Step pivot  Step pivot transfers +2 safety/equipment;Mod assist;+2 physical assistance  Transfer via Haematologist transfer comment Pt required mod A x 2 to stand from elevated bed height with walker but sat within 3-5 seconds. Use of stedy to stand from bed to transfer to chair. Mod x 2 to stand with stedy, lift pad placed on recliner surface  Ambulation/Gait  General Gait Details deferred  Balance  Overall balance assessment Needs assistance  Sitting-balance support No upper extremity supported;Feet supported  Sitting balance-Leahy Scale Fair  Standing balance support  During functional activity;Reliant on assistive device for balance  Standing balance-Leahy Scale Poor  Standing balance comment unable  General Comments  General comments (skin integrity, edema, etc.) Wife [name] present  Exercises  Exercises General Lower Extremity  General Exercises - Lower Extremity  Hip Flexion/Marching AROM;AAROM;Both;10 reps;Seated (5 reps AAROM with PT assistance, 5 reps just AROM)  Ankle Circles/Pumps AROM;Both;10 reps;Seated  Long Arc Quad AROM;Both;10 reps;Seated  PT - End of Session  Activity Tolerance Patient limited by fatigue  Patient left with call bell/phone within reach;in chair;with family/visitor present  Nurse Communication Mobility status;Need for lift equipment   PT - Assessment/Plan  PT Plan Current plan remains appropriate;Frequency needs to be updated  PT Visit Diagnosis Difficulty in walking, not elsewhere classified (R26.2);Pain;Muscle weakness (generalized) (M62.81)  Pain - Right/Left Left  Pain - part of body Knee (R & L thighs, R knee, abdomen)  PT Frequency (ACUTE ONLY) Min 4X/week  Recommendations for Other Services OT consult  Follow Up Recommendations Acute inpatient rehab (3hours/day)  Assistance recommended at discharge Frequent or constant Supervision/Assistance  Patient can return home with the following Two people to help with walking and/or transfers;Assistance with cooking/housework;Assist for transportation;Help with stairs or ramp for entrance;Two people to help with bathing/dressing/bathroom  PT equipment Rolling walker (2 wheels)  AM-PAC PT "6 Clicks" Mobility Outcome Measure (Version 2)  Help needed turning from your back to your side while in a flat bed without using bedrails? 2  Help needed moving from lying on your back to sitting on the side of a flat bed without using bedrails? 1  Help needed moving to and from a bed to a chair (including a wheelchair)? 1  Help needed standing up from a chair using your arms (e.g.,  wheelchair or bedside chair)? 1  Help needed to walk in hospital room? 1  Help needed climbing 3-5 steps with a railing?  1  6 Click Score 7  Consider Recommendation of Discharge To: CIR/SNF/LTACH  Progressive Mobility  What is the highest level of mobility based on the progressive mobility assessment? Level 3 (Stands with assist) - Balance while standing  and cannot march in place  Activity Stood at bedside;Transferred from bed to chair  PT Goal Progression  Progress towards PT goals Progressing toward goals  Acute Rehab PT Goals  PT Goal Formulation With patient/family  Time For Goal Achievement 02/25/22  Potential to Achieve Goals Good  PT Time Calculation  PT Start Time (ACUTE ONLY) 1117  PT Stop Time (ACUTE ONLY) 1158  PT Time Calculation (min) (ACUTE ONLY) 41 min  PT General Charges  $$ ACUTE PT VISIT 1 Visit  PT Treatments  $Therapeutic Activity 8-22 mins   Coolidge Breeze, PT, DPT [facility] Department Office: [phone] Weekend pager: [phone]

## 2022-02-27 NOTE — Progress Notes (Signed)
Referring Physician(s): Jeanmarie Hubert  Supervising Physician: Jacqulynn Cadet  Patient Status:  [name] - In-pt  Chief Complaint:  Abdominal pain/abscess  Subjective: Patient doing fairly well; denies nausea or vomiting or worsening abdominal pain; afebrile   Allergies: Lobster [shellfish allergy] and Poison ivy extract  Medications: Prior to Admission medications   Medication Sig Start Date End Date Taking? Authorizing Provider  acetaminophen (TYLENOL) 500 MG tablet Take 1,000 mg by mouth every 6 (six) hours as needed for mild pain.   Yes [provider]  atorvastatin (LIPITOR) 40 MG tablet TAKE 1 TABLET BY MOUTH EVERY DAY 02/01/22  Yes McGowen, Adrian Blackwater, MD  clotrimazole-betamethasone (LOTRISONE) cream Apply 1 Application topically 2 (two) times daily. 01/23/22  Yes McGowen, Adrian Blackwater, MD  diphenhydrAMINE (BENADRYL) 25 mg capsule Take 25 mg by mouth daily as needed for allergies.   Yes [provider]  diphenoxylate-atropine (LOMOTIL) 2.5-0.025 MG tablet Take 1-2 tablets by mouth 4 (four) times daily as needed for diarrhea or loose stools. 1-2 tabs po qid prn diarrhea 01/12/22  Yes Pyrtle, Lajuan Lines, MD  empagliflozin (JARDIANCE) 10 MG TABS tablet TAKE 1 TABLET BY MOUTH EVERY DAY BEFORE BREAKFAST Patient taking differently: Take 10 mg by mouth daily. 02/05/22  Yes McGowen, Adrian Blackwater, MD  lisinopril (ZESTRIL) 10 MG tablet TAKE 1 TABLET DAILY 01/10/22  Yes McGowen, Adrian Blackwater, MD  metFORMIN (GLUCOPHAGE) 500 MG tablet TAKE 1 TABLET BY MOUTH TWICE A DAY WITH FOOD 02/01/22  Yes McGowen, Adrian Blackwater, MD  methylphenidate (METADATE CD) 20 MG CR capsule Take 1 capsule (20 mg total) by mouth daily as needed. Patient taking differently: Take 20 mg by mouth daily as needed (for focuswhile at work). 01/16/22  Yes McGowen, Adrian Blackwater, MD  Accu-Chek Softclix Lancets lancets SMARTSIG:Topical 01/19/20   [provider]  glucose blood (ACCU-CHEK GUIDE) test strip USE TO TEST BLOOD SUGAR  TWICE DAILY 07/12/21   McGowen, Adrian Blackwater, MD  SYRINGE-NEEDLE, DISP, 3 ML 22G X 1-1/2" 3 ML MISC USE TO ADMINISTER TESTOSTERONE INJECTION EVERY 2 WEEKS 06/06/21   McGowen, Adrian Blackwater, MD  Syringe/Needle, Disp, 18G X 1" 3 ML MISC USE TO ADMINISTER TESTOSTERONE INJECTION EVERY 2 WEEKS 06/06/21   McGowen, Adrian Blackwater, MD  testosterone cypionate (DEPOTESTOSTERONE CYPIONATE) 200 MG/ML injection 200 MG (1 ML) SQ Q 2 WEEKS Patient not taking: Reported on 02/08/2022 12/12/21   [name], MD     Vital Signs: BP (!) 112/95   Pulse (!) 104   Temp 98.9 F (37.2 C) (Oral)   Resp (!) 24   Ht '5\' 9"'$  (1.753 m)   Wt 251 lb 15.8 oz (114.3 kg)   SpO2 96%   BMI 37.21 kg/m   Physical Exam awake, answering simple questions okay, left abdominal drain intact, insertion site okay, minimal tenderness, output 470 cc of turbid brown fluid, drain flushed without difficulty  Imaging: CT US GUIDED BIOPSY  Result Date: 02/26/2022 INDICATION: 66 year old gentleman with left lower quadrant abscess and right abdominal ascites presents to IR for paracentesis. EXAM: ULTRASOUND GUIDED  PARACENTESIS MEDICATIONS: None. COMPLICATIONS: None immediate. PROCEDURE: Informed written consent was obtained from the patient after a discussion of the risks, benefits and alternatives to treatment. A timeout was performed prior to the initiation of the procedure. Initial ultrasound scanning demonstrates a large amount of ascites within the right lower abdominal quadrant. The right lower abdomen was prepped and draped in the usual sterile fashion. 1% lidocaine was used for local anesthesia. Following this, a  19 gauge, 7-cm, Yueh catheter was introduced. An ultrasound image was saved for documentation purposes. The paracentesis was performed. The catheter was removed and a dressing was applied. The patient tolerated the procedure well without immediate post procedural complication. FINDINGS: A total of approximately 750 mL of straw-colored fluid  was removed. IMPRESSION: Ultrasound-guided paracentesis yielding 750 mL of clear straw-colored peritoneal fluid. Samples were sent for Gram stain and culture. Electronically Signed   By: Miachel Roux M.D.   On: 02/26/2022 13:55   CT GUIDED PERITONEAL/RETROPERITONEAL FLUID DRAIN BY PERC CATH  Result Date: 02/26/2022 INDICATION: 66 year old gentleman with history of inflammatory bowel disease recently underwent exploratory laparotomy with descending and sigmoid colectomy. Patient has had intermittent fevers and recent CT shows left lower quadrant rim enhancing collection suspicious for abscess. EXAM: CT-guided left lower quadrant abdominal abscess drain placement TECHNIQUE: Multidetector CT imaging of the abdomen was performed following the standard protocol without IV contrast. RADIATION DOSE REDUCTION: This exam was performed according to the departmental dose-optimization program which includes automated exposure control, adjustment of the mA and/or kV according to patient size and/or use of iterative reconstruction technique. MEDICATIONS: The patient is currently [facility] to the [name] and receiving intravenous antibiotics. The antibiotics were administered within an appropriate time frame prior to the initiation of the procedure. ANESTHESIA/SEDATION: Moderate (conscious) sedation was employed during this procedure. A total of Versed 1 mg and Fentanyl 75 mcg was administered intravenously by the radiology nurse. Total intra-service moderate Sedation Time: 37 minutes. The patient's level of consciousness and vital signs were monitored continuously by radiology nursing throughout the procedure under my direct supervision. COMPLICATIONS: None immediate. PROCEDURE: Informed written consent was obtained from the patient after a thorough discussion of the procedural risks, benefits and alternatives. All questions were addressed. Maximal Sterile Barrier Technique was utilized including caps, mask, sterile gowns,  sterile gloves, sterile drape, hand hygiene and skin antiseptic. A timeout was performed prior to the initiation of the procedure. Patient positioned supine on the procedure table. The left lower quadrant abdominal skin prepped and draped usual fashion. Following local lidocaine administration, the left lower quadrant fluid collection was entered with a 19 gauge Yueh needle utilizing CT guidance. The 19 gauge Yueh catheter was removed over 0.035 inch guidewire and replaced with 10.2 [place] multipurpose pigtail drain. 5 mL parent sample was aspirated and sent for Gram stain and culture. Post placement CT confirmed appropriate positioning of the abscess drain. The drain was secured to skin with suture and connected to bulb suction. IMPRESSION: CT-guided left lower quadrant abdominal 10 French abscess drain placement. Electronically Signed   By: Miachel Roux M.D.   On: 02/26/2022 13:52   CT ANGIO GI BLEED  Result Date: 02/25/2022 CLINICAL DATA:  Lower GI bleed (Ped 0-17y) Acute significant rectal bleeding past hour - he is s/p sigmoid resection and colostomy for perforation a couple weeks ago EXAM: CTA ABDOMEN AND PELVIS WITHOUT AND WITH CONTRAST TECHNIQUE: Multidetector CT imaging of the abdomen and pelvis was performed using the standard protocol during bolus administration of intravenous contrast. Multiplanar reconstructed images and MIPs were obtained and reviewed to evaluate the vascular anatomy. RADIATION DOSE REDUCTION: This exam was performed according to the departmental dose-optimization program which includes automated exposure control, adjustment of the mA and/or kV according to patient size and/or use of iterative reconstruction technique. CONTRAST:  157m OMNIPAQUE IOHEXOL 350 MG/ML SOLN COMPARISON:  CT AP, 02/19/2022 and 02/15/2022. FINDINGS: Suboptimal evaluation, secondary to motion degradation. VASCULAR Aorta: Normal caliber aorta without aneurysm,  dissection, vasculitis or significant stenosis.  Celiac: Patent without evidence of aneurysm, dissection, vasculitis or significant stenosis. SMA: Patent without evidence of aneurysm, dissection, vasculitis or significant stenosis. Renals: Both renal arteries are patent without evidence of aneurysm, dissection, vasculitis, fibromuscular dysplasia or significant stenosis. IMA: Patent without evidence of aneurysm, dissection, vasculitis or significant stenosis. Pelvis: Patent without evidence of aneurysm, dissection, vasculitis or significant stenosis. Proximal Outflow: Bilateral common femoral and visualized portions of the superficial and profunda femoral arteries are patent without evidence of aneurysm, dissection, vasculitis or significant stenosis. Veins: Portosystemic collateral arising off the SMV with rectal varix, and active extravasation within the rectum. Review of the MIP images confirms the above findings. NON-VASCULAR Lower chest: Small volume bilateral pleural effusions with adjacent dependent compressive atelectasis. Hepatobiliary: Hepatomegaly, with the RIGHT hepatic measuring 22 cm. No focal liver abnormality is seen. Contacted gallbladder with trace pericholecystic ascites. No gallstones, gallbladder wall thickening, or biliary dilatation. Pancreas: No pancreatic ductal dilatation or surrounding inflammatory changes. Spleen: Normal in size without focal abnormality. Adrenals/Urinary Tract: Adrenal glands are unremarkable. Kidneys are normal, without renal calculi, focal lesion, or hydronephrosis. Bladder is unremarkable. Stomach/Bowel: Stomach is within normal limits. Postsurgical changes of LEFT hemicolectomy with end colostomy at the LEFT lower quadrant. Mildly edematous loops of bowel at the LEFT lower quadrant, with the staple line difficult to assess. Rectal variceal hemorrhage, as described above. Rectosigmoid distention of stool, with additional layering hemorrhage. Lymphatic: No enlarged abdominal or pelvic lymph nodes. Reproductive:  Prostate is unremarkable. Other: Small fat containing parastomal hernia. Moderate volume intra-abdominal ascites, is seen within the perihepatic and perisplenic spaces, polyclonal gutters and layering within the RIGHT lower quadrant. Additional air-and-fluid containing, rim-enhancing fluid collection at the LEFT lower quadrant measures 6.7 x 7.3 x 9.5 cm (AP by transverse by CC), consistent with early abscess. Musculoskeletal: Obese with [place] abdomen. Moderate-to-moderate body wall edema, greatest within the flanks. No acute or significant osseous findings. IMPRESSION: VASCULAR 1. Examination is POSITIVE for acute extravasation within the rectum, with target vessel originating from a branch of the SMV consistent with venous hemorrhage. NON-VASCULAR 1. Postsurgical changes of hemicolectomy with colostomy at the LEFT lower quadrant. Small fat-containing containing parastomal hernia without evidence of obstruction. 2. Edematous loops of bowel in the LEFT lower quadrant, about the staple line, with adjacent 10 cm a-and-fluid containing, rim enhancing fluid collection consistent with early abscess. 3.  Additional incidental, chronic and senescent findings as above. These results were called by telephone at the time of interpretation on 02/25/2022 at 4:20 pm to provider Dr Rush Landmark, who verbally acknowledged these results. Michaelle Birks, MD [name] Electronically Signed   By: Michaelle Birks M.D.   On: 02/25/2022 16:50   VAS [place] UPPER EXTREMITY VENOUS DUPLEX  Result Date: 02/25/2022 UPPER VENOUS STUDY  Patient Name:  [name]  Date of Exam:   02/25/2022 Medical Rec #: [id number]       Accession #:    [id number] Date of Birth: 1956/01/26       Patient Gender: M Patient Age:   66 years Exam Location:  [name] Procedure:      VAS [place] UPPER EXTREMITY VENOUS DUPLEX Referring Phys: Dwyane Dee  --------------------------------------------------------------------------------  Indications: Edema Other Indications: IV to LUE [name] Limitations: Body habitus and poor ultrasound/tissue interface. Comparison Study: No previous exams Performing Technologist: Jody Hill RVT, RDMS  Examination Guidelines: A complete evaluation includes B-mode imaging, spectral Doppler, color Doppler, and power Doppler as needed of  all accessible portions of each vessel. Bilateral testing is considered an integral part of a complete examination. Limited examinations for reoccurring indications may be performed as noted.  Right Findings: +----------+------------+---------+-----------+----------+--------------------+ RIGHT     CompressiblePhasicitySpontaneousProperties      Summary        +----------+------------+---------+-----------+----------+--------------------+ Subclavian               Yes       Yes                   patent by                                                              color/doppler     +----------+------------+---------+-----------+----------+--------------------+  Left Findings: +----------+------------+---------+-----------+----------+--------------------+ LEFT      CompressiblePhasicitySpontaneousProperties      Summary        +----------+------------+---------+-----------+----------+--------------------+ IJV           Full       Yes       Yes                                   +----------+------------+---------+-----------+----------+--------------------+ Subclavian    Full       Yes       Yes                                   +----------+------------+---------+-----------+----------+--------------------+ Axillary      Full       Yes       Yes                                   +----------+------------+---------+-----------+----------+--------------------+ Brachial      Full       Yes       Yes                                    +----------+------------+---------+-----------+----------+--------------------+ Radial                                              patent by color only +----------+------------+---------+-----------+----------+--------------------+ Ulnar                                               patent by color only +----------+------------+---------+-----------+----------+--------------------+ Cephalic      Full                                                       +----------+------------+---------+-----------+----------+--------------------+ Basilic       Full       Yes  Yes                                   +----------+------------+---------+-----------+----------+--------------------+ Ulnar and radial veins not well visualized due to overlying edema. Patent by color only.  Summary:  Right: No evidence of thrombosis in the subclavian.  Left: Difficult exam due to poor ultrasound/ tissue interface and diffuse subcutaneous edema of forearm. No obvious evidence of deep vein thrombosis in the upper extremity in areas visualized. No obvious evidence of superficial vein thrombosis in the upper extremity in areas visualized.  *See table(s) above for measurements and observations.  Diagnosing physician: Monica Martinez MD Electronically signed by Monica Martinez MD on 02/25/2022 at 1:53:04 PM.    Final     Labs:  CBC: Recent Labs    02/25/22 0550 02/25/22 1201 02/25/22 1329 02/26/22 0521 02/26/22 1606 02/26/22 2013 02/27/22 0225 02/27/22 0812  WBC 4.9 5.9  --  4.8  --   --  5.3  --   HGB 7.7* 8.6*   < > 8.1* 8.4* 8.3* 7.8* 8.1*  HCT 24.9* 27.2*   < > 25.1* 26.0* 25.6* 24.6* 25.4*  PLT 254 296  --  205  --   --  203  --    < > = values in this interval not displayed.    COAGS: Recent Labs    02/07/22 2030 02/26/22 0816  INR 1.3* 1.3*  APTT 30  --     BMP: Recent Labs    02/24/22 0530 02/25/22 0550 02/26/22 0521 02/27/22 0225  NA 136 138 140 140  K 4.3 3.8  3.5 3.5  CL 101 100 102 101  CO2 31 32 34* 33*  GLUCOSE 160* 132* 115* 187*  BUN '12 9 11 11  '$ CALCIUM 7.8* 8.0* 7.8* 8.0*  CREATININE 0.31* 0.34* <0.30* 0.42*  GFRNONAA >60 >60 NOT CALCULATED >60    LIVER FUNCTION TESTS: Recent Labs    02/24/22 0530 02/25/22 0550 02/26/22 0521 02/27/22 0225  BILITOT 0.4 0.6 0.7 0.4  AST '24 20 15 16  '$ ALT '17 15 11 10  '$ ALKPHOS 138* 138* 84 80  PROT 5.9* 5.9* 5.4* 5.3*  ALBUMIN <1.5* 2.0* 2.4* 2.8*    Assessment and Plan: Patient with history of IBD,  sigmoid colon perforation with exploratory lap, colon resection, and colostomy 02/07/22; now with postop left abdominal abscess, status post drain placement on 12/12 ( 10 fr to JP); afebrile, WBC normal, hemoglobin 8.1 up from 7.8, ascitic fluid cultures pending, abdominal drain fluid cultures pending; continue with drain irrigation, close output monitoring ,lab checks; obtain follow-up imaging once output minimal or if WBC rises; may need injection study before drain removal. Other plans per [facility], GI,TRH.   Electronically Signed: D. Rowe Robert, PA-C 02/27/2022, 11:22 AM   I spent a total of 15 Minutes at the the patient's bedside AND on the patient's [name] floor or unit, greater than 50% of which was counseling/coordinating care for left abdominal abscess drain    Patient ID: [name], male   DOB: [birth year], 66 y.o.   MRN: [id number]

## 2022-02-27 NOTE — Progress Notes (Signed)
Inpatient Rehab Admissions Coordinator:    I spoke with Pt. Regarding potential CIR admit, he remains interested. Reached out to MD to see when Pt. May be stable for d/c. PT/OT to see Pt. Today. If he is likely to be ready for dc this week and participates well with therapies, I will send his case to insurance.   Clemens Catholic, [place], [place] Admissions Coordinator  [phone] ([place]) [phone] (office)

## 2022-02-28 ENCOUNTER — Encounter (HOSPITAL_COMMUNITY): Payer: Self-pay | Admitting: Gastroenterology

## 2022-02-28 ENCOUNTER — Other Ambulatory Visit: Payer: Self-pay | Admitting: Gastroenterology

## 2022-02-28 DIAGNOSIS — R198 Other specified symptoms and signs involving the digestive system and abdomen: Secondary | ICD-10-CM

## 2022-02-28 DIAGNOSIS — K651 Peritoneal abscess: Secondary | ICD-10-CM

## 2022-02-28 DIAGNOSIS — R194 Change in bowel habit: Secondary | ICD-10-CM

## 2022-02-28 DIAGNOSIS — K921 Melena: Secondary | ICD-10-CM

## 2022-02-28 DIAGNOSIS — A419 Sepsis, unspecified organism: Secondary | ICD-10-CM | POA: Diagnosis not present

## 2022-02-28 DIAGNOSIS — R933 Abnormal findings on diagnostic imaging of other parts of digestive tract: Secondary | ICD-10-CM | POA: Diagnosis not present

## 2022-02-28 DIAGNOSIS — K50119 Crohn's disease of large intestine with unspecified complications: Secondary | ICD-10-CM | POA: Diagnosis not present

## 2022-02-28 DIAGNOSIS — K922 Gastrointestinal hemorrhage, unspecified: Secondary | ICD-10-CM | POA: Diagnosis not present

## 2022-02-28 DIAGNOSIS — Z86010 Personal history of colonic polyps: Secondary | ICD-10-CM

## 2022-02-28 DIAGNOSIS — R21 Rash and other nonspecific skin eruption: Secondary | ICD-10-CM

## 2022-02-28 DIAGNOSIS — K626 Ulcer of anus and rectum: Secondary | ICD-10-CM | POA: Diagnosis not present

## 2022-02-28 DIAGNOSIS — K650 Generalized (acute) peritonitis: Secondary | ICD-10-CM | POA: Diagnosis not present

## 2022-02-28 LAB — TYPE AND SCREEN
ABO/RH(D): O NEG
Antibody Screen: NEGATIVE
Unit division: 0
Unit division: 0
Unit division: 0
Unit division: 0
Unit division: 0
Unit division: 0
Unit division: 0

## 2022-02-28 LAB — GLUCOSE, CAPILLARY
Glucose-Capillary: 244 mg/dL — ABNORMAL HIGH (ref 70–99)
Glucose-Capillary: 223 mg/dL — ABNORMAL HIGH (ref 70–99)
Glucose-Capillary: 223 mg/dL — ABNORMAL HIGH (ref 70–99)
Glucose-Capillary: 312 mg/dL — ABNORMAL HIGH (ref 70–99)
Glucose-Capillary: 317 mg/dL — ABNORMAL HIGH (ref 70–99)

## 2022-02-28 LAB — BPAM RBC
Blood Product Expiration Date: 202401132359
Blood Product Expiration Date: 202401142359
Blood Product Expiration Date: 202401142359
Blood Product Expiration Date: 202401142359
Blood Product Expiration Date: 202401162359
Blood Product Expiration Date: 202401182359
Blood Product Expiration Date: 202401182359
ISSUE DATE / TIME: 202312111410
ISSUE DATE / TIME: 202312111443
ISSUE DATE / TIME: 202312112058
ISSUE DATE / TIME: 202312120006
Unit Type and Rh: 9500
Unit Type and Rh: 9500
Unit Type and Rh: 9500
Unit Type and Rh: 9500
Unit Type and Rh: 9500
Unit Type and Rh: 9500
Unit Type and Rh: 9500

## 2022-02-28 LAB — COMPREHENSIVE METABOLIC PANEL
ALT: 10 U/L (ref 0–44)
AST: 16 U/L (ref 15–41)
Albumin: 2.7 g/dL — ABNORMAL LOW (ref 3.5–5.0)
Alkaline Phosphatase: 98 U/L (ref 38–126)
Anion gap: 9 (ref 5–15)
BUN: 11 mg/dL (ref 8–23)
CO2: 30 mmol/L (ref 22–32)
Calcium: 8 mg/dL — ABNORMAL LOW (ref 8.9–10.3)
Chloride: 101 mmol/L (ref 98–111)
Creatinine, Ser: 0.37 mg/dL — ABNORMAL LOW (ref 0.61–1.24)
GFR, Estimated: >60 mL/min (ref >60)
Glucose, Bld: 275 mg/dL — ABNORMAL HIGH (ref 70–99)
Potassium: 4.2 mmol/L (ref 3.5–5.1)
Sodium: 140 mmol/L (ref 135–145)
Total Bilirubin: 0.6 mg/dL (ref 0.3–1.2)
Total Protein: 6.2 g/dL — ABNORMAL LOW (ref 6.5–8.1)

## 2022-02-28 LAB — CBC WITH DIFFERENTIAL/PLATELET
Abs Immature Granulocytes: 0.06 10*3/uL (ref 0.00–0.07)
Basophils Absolute: 0 10*3/uL (ref 0.0–0.1)
Basophils Relative: 0 %
Eosinophils Absolute: 0 10*3/uL (ref 0.0–0.5)
Eosinophils Relative: 0 %
HCT: 26.6 % — ABNORMAL LOW (ref 39.0–52.0)
Hemoglobin: 8.4 g/dL — ABNORMAL LOW (ref 13.0–17.0)
Immature Granulocytes: 1 %
Lymphocytes Relative: 9 %
Lymphs Abs: 0.5 10*3/uL — ABNORMAL LOW (ref 0.7–4.0)
MCH: 30.9 pg (ref 26.0–34.0)
MCHC: 31.6 g/dL (ref 30.0–36.0)
MCV: 97.8 fL (ref 80.0–100.0)
Monocytes Absolute: 0.1 10*3/uL (ref 0.1–1.0)
Monocytes Relative: 2 %
Neutro Abs: 4.8 10*3/uL (ref 1.7–7.7)
Neutrophils Relative %: 88 %
Platelets: 223 10*3/uL (ref 150–400)
RBC: 2.72 MIL/uL — ABNORMAL LOW (ref 4.22–5.81)
RDW: 15.6 % — ABNORMAL HIGH (ref 11.5–15.5)
WBC: 5.4 10*3/uL (ref 4.0–10.5)
nRBC: 0 % (ref 0.0–0.2)

## 2022-02-28 LAB — PHOSPHORUS: Phosphorus: 2.7 mg/dL (ref 2.5–4.6)

## 2022-02-28 LAB — MAGNESIUM: Magnesium: 1.8 mg/dL (ref 1.7–2.4)

## 2022-02-28 LAB — HEMOGLOBIN A1C
Hgb A1c MFr Bld: 6.1 % — ABNORMAL HIGH (ref 4.8–5.6)
Mean Plasma Glucose: 128 mg/dL

## 2022-02-28 MED ORDER — SODIUM CHLORIDE 0.9 % IV SOLN: INTRAVENOUS | Status: DC

## 2022-02-28 MED ORDER — HYDROXYZINE HCL 25 MG PO TABS: 25.0000 mg | ORAL_TABLET | Freq: Three times a day (TID) | ORAL | Status: DC | PRN

## 2022-02-28 MED ORDER — HYDROCORTISONE 0.5 % EX CREA
TOPICAL_CREAM | Freq: Two times a day (BID) | CUTANEOUS | Status: DC | PRN
  Filled 2022-02-28: qty 28.35

## 2022-02-28 NOTE — Progress Notes (Deleted)
BP continue to be elevated, new orders to administer PRN hydralazine.

## 2022-02-28 NOTE — Assessment & Plan Note (Signed)
Discussed case with Dr. Rush Landmark today.  He wants to perform a colonoscopy through ostomy evaluation and since the patient is pending discharge to CIR, likely on Friday he will likely postpone this until the patient is transferred to CIR and then attempt to get this done while the patient is actively receiving rehab there. Biopsies earlier in the hospitalization consistent with inflammatory bowel disease favorable towards Crohn's disease Additionally, numerous ulcerations noted on flexible sigmoidoscopy performed on 12/12 Flagyl originally initiated for Crohn's colitis being discontinued today due to diffuse erythematous rash. Gastroenterology has initiated intravenous Solu-Medrol 12/13 which will eventually be transitioned to oral therapy and outpatient immunomodulating therapy

## 2022-02-28 NOTE — Progress Notes (Signed)
Referring Physician(s): Jeanmarie Hubert  Supervising Physician: Jacqulynn Cadet  Patient Status:  [facility] - In-pt  Chief Complaint:  Abdominal pain/abscess   Subjective: Patient doing okay, denies worsening abdominal pain, fever, nausea, vomiting   Allergies: Lobster [shellfish allergy] and Poison ivy extract  Medications: Prior to Admission medications   Medication Sig Start Date End Date Taking? Authorizing Provider  acetaminophen (TYLENOL) 500 MG tablet Take 1,000 mg by mouth every 6 (six) hours as needed for mild pain.   Yes [provider]  atorvastatin (LIPITOR) 40 MG tablet TAKE 1 TABLET BY MOUTH EVERY DAY 02/01/22  Yes McGowen, Adrian Blackwater, MD  clotrimazole-betamethasone (LOTRISONE) cream Apply 1 Application topically 2 (two) times daily. 01/23/22  Yes McGowen, Adrian Blackwater, MD  diphenhydrAMINE (BENADRYL) 25 mg capsule Take 25 mg by mouth daily as needed for allergies.   Yes [provider]  diphenoxylate-atropine (LOMOTIL) 2.5-0.025 MG tablet Take 1-2 tablets by mouth 4 (four) times daily as needed for diarrhea or loose stools. 1-2 tabs po qid prn diarrhea 01/12/22  Yes Pyrtle, Lajuan Lines, MD  empagliflozin (JARDIANCE) 10 MG TABS tablet TAKE 1 TABLET BY MOUTH EVERY DAY BEFORE BREAKFAST Patient taking differently: Take 10 mg by mouth daily. 02/05/22  Yes McGowen, Adrian Blackwater, MD  lisinopril (ZESTRIL) 10 MG tablet TAKE 1 TABLET DAILY 01/10/22  Yes McGowen, Adrian Blackwater, MD  metFORMIN (GLUCOPHAGE) 500 MG tablet TAKE 1 TABLET BY MOUTH TWICE A DAY WITH FOOD 02/01/22  Yes McGowen, Adrian Blackwater, MD  methylphenidate (METADATE CD) 20 MG CR capsule Take 1 capsule (20 mg total) by mouth daily as needed. Patient taking differently: Take 20 mg by mouth daily as needed (for focuswhile at work). 01/16/22  Yes McGowen, Adrian Blackwater, MD  Accu-Chek Softclix Lancets lancets SMARTSIG:Topical 01/19/20   [provider]  glucose blood (ACCU-CHEK GUIDE) test strip USE TO TEST BLOOD SUGAR TWICE DAILY  07/12/21   McGowen, Adrian Blackwater, MD  SYRINGE-NEEDLE, DISP, 3 ML 22G X 1-1/2" 3 ML MISC USE TO ADMINISTER TESTOSTERONE INJECTION EVERY 2 WEEKS 06/06/21   McGowen, Adrian Blackwater, MD  Syringe/Needle, Disp, 18G X 1" 3 ML MISC USE TO ADMINISTER TESTOSTERONE INJECTION EVERY 2 WEEKS 06/06/21   McGowen, Adrian Blackwater, MD  testosterone cypionate (DEPOTESTOSTERONE CYPIONATE) 200 MG/ML injection 200 MG (1 ML) SQ Q 2 WEEKS Patient not taking: Reported on 02/08/2022 12/12/21   [name], MD     Vital Signs: BP 113/65   Pulse 86   Temp 98.9 F (37.2 C) (Oral)   Resp (!) 23   Ht '5\' 9"'$  (1.753 m)   Wt 247 lb 5.7 oz (112.2 kg)   SpO2 94%   BMI 36.53 kg/m   Physical Exam awake, alert; left abdominal drain intact, insertion site okay, not sig tender,  output 60 cc of turbid brown fluid, drain flushed without difficulty   Imaging: CT US GUIDED BIOPSY  Result Date: 02/26/2022 INDICATION: 66 year old gentleman with left lower quadrant abscess and right abdominal ascites presents to IR for paracentesis. EXAM: ULTRASOUND GUIDED  PARACENTESIS MEDICATIONS: None. COMPLICATIONS: None immediate. PROCEDURE: Informed written consent was obtained from the patient after a discussion of the risks, benefits and alternatives to treatment. A timeout was performed prior to the initiation of the procedure. Initial ultrasound scanning demonstrates a large amount of ascites within the right lower abdominal quadrant. The right lower abdomen was prepped and draped in the usual sterile fashion. 1% lidocaine was used for local anesthesia. Following this, a 19 gauge, 7-cm, Teressa Lower  catheter was introduced. An ultrasound image was saved for documentation purposes. The paracentesis was performed. The catheter was removed and a dressing was applied. The patient tolerated the procedure well without immediate post procedural complication. FINDINGS: A total of approximately 750 mL of straw-colored fluid was removed. IMPRESSION: Ultrasound-guided  paracentesis yielding 750 mL of clear straw-colored peritoneal fluid. Samples were sent for Gram stain and culture. Electronically Signed   By: Miachel Roux M.D.   On: 02/26/2022 13:55   CT GUIDED PERITONEAL/RETROPERITONEAL FLUID DRAIN BY PERC CATH  Result Date: 02/26/2022 INDICATION: 66 year old gentleman with history of inflammatory bowel disease recently underwent exploratory laparotomy with descending and sigmoid colectomy. Patient has had intermittent fevers and recent CT shows left lower quadrant rim enhancing collection suspicious for abscess. EXAM: CT-guided left lower quadrant abdominal abscess drain placement TECHNIQUE: Multidetector CT imaging of the abdomen was performed following the standard protocol without IV contrast. RADIATION DOSE REDUCTION: This exam was performed according to the departmental dose-optimization program which includes automated exposure control, adjustment of the mA and/or kV according to patient size and/or use of iterative reconstruction technique. MEDICATIONS: The patient is currently admitted to the hospital and receiving intravenous antibiotics. The antibiotics were administered within an appropriate time frame prior to the initiation of the procedure. ANESTHESIA/SEDATION: Moderate (conscious) sedation was employed during this procedure. A total of Versed 1 mg and Fentanyl 75 mcg was administered intravenously by the radiology nurse. Total intra-service moderate Sedation Time: 37 minutes. The patient's level of consciousness and vital signs were monitored continuously by radiology nursing throughout the procedure under my direct supervision. COMPLICATIONS: None immediate. PROCEDURE: Informed written consent was obtained from the patient after a thorough discussion of the procedural risks, benefits and alternatives. All questions were addressed. Maximal Sterile Barrier Technique was utilized including caps, mask, sterile gowns, sterile gloves, sterile drape, hand hygiene  and skin antiseptic. A timeout was performed prior to the initiation of the procedure. Patient positioned supine on the procedure table. The left lower quadrant abdominal skin prepped and draped usual fashion. Following local lidocaine administration, the left lower quadrant fluid collection was entered with a 19 gauge Yueh needle utilizing CT guidance. The 19 gauge Yueh catheter was removed over 0.035 inch guidewire and replaced with 10.2 [place] multipurpose pigtail drain. 5 mL parent sample was aspirated and sent for Gram stain and culture. Post placement CT confirmed appropriate positioning of the abscess drain. The drain was secured to skin with suture and connected to bulb suction. IMPRESSION: CT-guided left lower quadrant abdominal 10 French abscess drain placement. Electronically Signed   By: Miachel Roux M.D.   On: 02/26/2022 13:52   CT ANGIO GI BLEED  Result Date: 02/25/2022 CLINICAL DATA:  Lower GI bleed (Ped 0-17y) Acute significant rectal bleeding past hour - he is s/p sigmoid resection and colostomy for perforation a couple weeks ago EXAM: CTA ABDOMEN AND PELVIS WITHOUT AND WITH CONTRAST TECHNIQUE: Multidetector CT imaging of the abdomen and pelvis was performed using the standard protocol during bolus administration of intravenous contrast. Multiplanar reconstructed images and MIPs were obtained and reviewed to evaluate the vascular anatomy. RADIATION DOSE REDUCTION: This exam was performed according to the departmental dose-optimization program which includes automated exposure control, adjustment of the mA and/or kV according to patient size and/or use of iterative reconstruction technique. CONTRAST:  120m OMNIPAQUE IOHEXOL 350 MG/ML SOLN COMPARISON:  CT AP, 02/19/2022 and 02/15/2022. FINDINGS: Suboptimal evaluation, secondary to motion degradation. VASCULAR Aorta: Normal caliber aorta without aneurysm, dissection, vasculitis or significant  stenosis. Celiac: Patent without evidence of aneurysm,  dissection, vasculitis or significant stenosis. SMA: Patent without evidence of aneurysm, dissection, vasculitis or significant stenosis. Renals: Both renal arteries are patent without evidence of aneurysm, dissection, vasculitis, fibromuscular dysplasia or significant stenosis. IMA: Patent without evidence of aneurysm, dissection, vasculitis or significant stenosis. Pelvis: Patent without evidence of aneurysm, dissection, vasculitis or significant stenosis. Proximal Outflow: Bilateral common femoral and visualized portions of the superficial and profunda femoral arteries are patent without evidence of aneurysm, dissection, vasculitis or significant stenosis. Veins: Portosystemic collateral arising off the SMV with rectal varix, and active extravasation within the rectum. Review of the MIP images confirms the above findings. NON-VASCULAR Lower chest: Small volume bilateral pleural effusions with adjacent dependent compressive atelectasis. Hepatobiliary: Hepatomegaly, with the RIGHT hepatic measuring 22 cm. No focal liver abnormality is seen. Contacted gallbladder with trace pericholecystic ascites. No gallstones, gallbladder wall thickening, or biliary dilatation. Pancreas: No pancreatic ductal dilatation or surrounding inflammatory changes. Spleen: Normal in size without focal abnormality. Adrenals/Urinary Tract: Adrenal glands are unremarkable. Kidneys are normal, without renal calculi, focal lesion, or hydronephrosis. Bladder is unremarkable. Stomach/Bowel: Stomach is within normal limits. Postsurgical changes of LEFT hemicolectomy with end colostomy at the LEFT lower quadrant. Mildly edematous loops of bowel at the LEFT lower quadrant, with the staple line difficult to assess. Rectal variceal hemorrhage, as described above. Rectosigmoid distention of stool, with additional layering hemorrhage. Lymphatic: No enlarged abdominal or pelvic lymph nodes. Reproductive: Prostate is unremarkable. Other: Small fat  containing parastomal hernia. Moderate volume intra-abdominal ascites, is seen within the perihepatic and perisplenic spaces, polyclonal gutters and layering within the RIGHT lower quadrant. Additional air-and-fluid containing, rim-enhancing fluid collection at the LEFT lower quadrant measures 6.7 x 7.3 x 9.5 cm (AP by transverse by CC), consistent with early abscess. Musculoskeletal: Obese with [place] abdomen. Moderate-to-moderate body wall edema, greatest within the flanks. No acute or significant osseous findings. IMPRESSION: VASCULAR 1. Examination is POSITIVE for acute extravasation within the rectum, with target vessel originating from a branch of the SMV consistent with venous hemorrhage. NON-VASCULAR 1. Postsurgical changes of hemicolectomy with colostomy at the LEFT lower quadrant. Small fat-containing containing parastomal hernia without evidence of obstruction. 2. Edematous loops of bowel in the LEFT lower quadrant, about the staple line, with adjacent 10 cm a-and-fluid containing, rim enhancing fluid collection consistent with early abscess. 3.  Additional incidental, chronic and senescent findings as above. These results were called by telephone at the time of interpretation on 02/25/2022 at 4:20 pm to provider Dr Rush Landmark, who verbally acknowledged these results. Michaelle Birks, MD [facility]iology Electronically Signed   By: Michaelle Birks M.D.   On: 02/25/2022 16:50   VAS [place] UPPER EXTREMITY VENOUS DUPLEX  Result Date: 02/25/2022 UPPER VENOUS STUDY  Patient Name:  FIRAS GUARDADO  Date of Exam:   02/25/2022 Medical Rec #: [id number]       Accession #:    [id number] Date of Birth: 11/06/1955       Patient Gender: M Patient Age:   27 years Exam Location:  [facility] Procedure:      VAS [place] UPPER EXTREMITY VENOUS DUPLEX Referring Phys: Dwyane Dee --------------------------------------------------------------------------------  Indications:  Edema Other Indications: IV to LUE [facility]). Limitations: Body habitus and poor ultrasound/tissue interface. Comparison Study: No previous exams Performing Technologist: Jody Hill RVT, RDMS  Examination Guidelines: A complete evaluation includes B-mode imaging, spectral Doppler, color Doppler, and power Doppler as needed of all accessible portions of  each vessel. Bilateral testing is considered an integral part of a complete examination. Limited examinations for reoccurring indications may be performed as noted.  Right Findings: +----------+------------+---------+-----------+----------+--------------------+ RIGHT     CompressiblePhasicitySpontaneousProperties      Summary        +----------+------------+---------+-----------+----------+--------------------+ Subclavian               Yes       Yes                   patent by                                                              color/doppler     +----------+------------+---------+-----------+----------+--------------------+  Left Findings: +----------+------------+---------+-----------+----------+--------------------+ LEFT      CompressiblePhasicitySpontaneousProperties      Summary        +----------+------------+---------+-----------+----------+--------------------+ IJV           Full       Yes       Yes                                   +----------+------------+---------+-----------+----------+--------------------+ Subclavian    Full       Yes       Yes                                   +----------+------------+---------+-----------+----------+--------------------+ Axillary      Full       Yes       Yes                                   +----------+------------+---------+-----------+----------+--------------------+ Brachial      Full       Yes       Yes                                   +----------+------------+---------+-----------+----------+--------------------+ Radial                                               patent by color only +----------+------------+---------+-----------+----------+--------------------+ Ulnar                                               patent by color only +----------+------------+---------+-----------+----------+--------------------+ Cephalic      Full                                                       +----------+------------+---------+-----------+----------+--------------------+ Basilic       Full       Yes       Yes                                   +----------+------------+---------+-----------+----------+--------------------+  Ulnar and radial veins not well visualized due to overlying edema. Patent by color only.  Summary:  Right: No evidence of thrombosis in the subclavian.  Left: Difficult exam due to poor ultrasound/ tissue interface and diffuse subcutaneous edema of forearm. No obvious evidence of deep vein thrombosis in the upper extremity in areas visualized. No obvious evidence of superficial vein thrombosis in the upper extremity in areas visualized.  *See table(s) above for measurements and observations.  Diagnosing physician: Monica Martinez MD Electronically signed by Monica Martinez MD on 02/25/2022 at 1:53:04 PM.    Final     Labs:  CBC: Recent Labs    02/25/22 1201 02/25/22 1329 02/26/22 0521 02/26/22 1606 02/27/22 0225 02/27/22 0812 02/27/22 1730 02/28/22 0410  WBC 5.9  --  4.8  --  5.3  --   --  5.4  HGB 8.6*   < > 8.1*   < > 7.8* 8.1* 8.2* 8.4*  HCT 27.2*   < > 25.1*   < > 24.6* 25.4* 26.0* 26.6*  PLT 296  --  205  --  203  --   --  223   < > = values in this interval not displayed.    COAGS: Recent Labs    02/07/22 2030 02/26/22 0816  INR 1.3* 1.3*  APTT 30  --     BMP: Recent Labs    02/25/22 0550 02/26/22 0521 02/27/22 0225 02/28/22 0410  NA 138 140 140 140  K 3.8 3.5 3.5 4.2  CL 100 102 101 101  CO2 32 34* 33* 30  GLUCOSE 132* 115* 187* 275*  BUN '9 11 11 11  '$ CALCIUM 8.0* 7.8* 8.0*  8.0*  CREATININE 0.34* <0.30* 0.42* 0.37*  GFRNONAA >60 NOT CALCULATED >60 >60    LIVER FUNCTION TESTS: Recent Labs    02/25/22 0550 02/26/22 0521 02/27/22 0225 02/28/22 0410  BILITOT 0.6 0.7 0.4 0.6  AST '20 15 16 16  '$ ALT '15 11 10 10  '$ ALKPHOS 138* 84 80 98  PROT 5.9* 5.4* 5.3* 6.2*  ALBUMIN 2.0* 2.4* 2.8* 2.7*    Assessment and Plan: Patient with history of IBD,  sigmoid colon perforation with exploratory lap, colon resection, and colostomy 02/07/22; now with postop left abdominal abscess, status post drain placement on 12/12 ( 10 fr to JP); afebrile, WBC normal, hemoglobin 8.4,  creat 0.37, ascitic fluid cultures neg to date, abdominal drain fluid cultures pending- gm neg rods; continue with drain irrigation, close output monitoring ,lab checks; obtain follow-up imaging once output minimal or if WBC rises; may need injection study before drain removal. Other plans per [facility], GI,TRH.    Electronically Signed: D. Rowe Robert, PA-C 02/28/2022, 11:47 AM   I spent a total of 15 Minutes at the the patient's bedside AND on the patient's hospital floor or unit, greater than 50% of which was counseling/coordinating care for abdominal abscess drain    Patient ID: Richard [name], male   DOB: 04-16-1955, 66 y.o.   MRN: [id number]

## 2022-02-28 NOTE — Progress Notes (Signed)
Inpatient Rehab Admissions Coordinator:  Began insurance authorization. Will continue to follow.   Gayland Curry, [place], [place] Admissions Coordinator [phone]

## 2022-02-28 NOTE — Progress Notes (Signed)
Progress Note  Primary GI: Dr. [name]   Subjective  Chief Complaint:IBD with acute bowel perforation  Patient lying in the room no acute distress.  Does have new macular erythematous pruritic rash diffusely along chest, back bilateral arms. Patient started on IV Flagyl which is most likely culprit. Also started on 40 mg IV Solu-Medrol daily since patient had peritoneal fluid negative culture for 24-hour. Patient had low-grade temperature last night 99.3.  States the room was warm. Denies nausea, vomiting. Has mild abdominal discomfort    Objective   Vital signs in last 24 hours: Temp:  [98.9 F (37.2 C)-99.4 F (37.4 C)] 99.3 F (37.4 C) (12/14 0411) Pulse Rate:  [88-114] 88 (12/14 0400) Resp:  [20-33] 22 (12/14 0400) BP: (127-150)/(53-73) 150/73 (12/14 0400) SpO2:  [92 %-99 %] 92 % (12/14 0400) Weight:  [112.2 kg] 112.2 kg (12/14 0411) Last BM Date : 02/28/22 Last BM recorded by nurses in past 5 days Stool Type: Type 7 (Liquid consistency with no solid pieces) (02/25/2022 11:49 PM)  General: Obese male in no acute distress  Heart:  Regular rate and rhythm; no murmurs Pulm: Clear anteriorly; no wheezing Abdomen:  Soft, Obese AB, Active bowel sounds. No tenderness . Without guarding and Without rebound, left upper quadrant colostomy with green/brown soft stool, pink ostomy with mild ulceration.  Left abdomen JP bulb being changed by the nurse blood present  95 output total, purulent drainage Extremities:  with  edema bilateral legs with stasis dermatitis.  Along chest, abdomen and bilateral arms macular erythematous rash diffuse. Neurologic:  Alert and  oriented x4;  No focal deficits.  Psych:  Cooperative. Normal mood and affect.  Intake/Output from previous day: 12/13 0701 - 12/14 0700 In: 340.6 [IV Piggyback:340.6] Out: 2096 [Urine:1750; Drains:95; Stool:251] Intake/Output this shift: No intake/output data recorded.  Studies/Results: CT US GUIDED  BIOPSY  Result Date: 02/26/2022 INDICATION: 66 year old gentleman with left lower quadrant abscess and right abdominal ascites presents to IR for paracentesis. EXAM: ULTRASOUND GUIDED  PARACENTESIS MEDICATIONS: None. COMPLICATIONS: None immediate. PROCEDURE: Informed written consent was obtained from the patient after a discussion of the risks, benefits and alternatives to treatment. A timeout was performed prior to the initiation of the procedure. Initial ultrasound scanning demonstrates a large amount of ascites within the right lower abdominal quadrant. The right lower abdomen was prepped and draped in the usual sterile fashion. 1% lidocaine was used for local anesthesia. Following this, a 19 gauge, 7-cm, Yueh catheter was introduced. An ultrasound image was saved for documentation purposes. The paracentesis was performed. The catheter was removed and a dressing was applied. The patient tolerated the procedure well without immediate post procedural complication. FINDINGS: A total of approximately 750 mL of straw-colored fluid was removed. IMPRESSION: Ultrasound-guided paracentesis yielding 750 mL of clear straw-colored peritoneal fluid. Samples were sent for Gram stain and culture. Electronically Signed   By: Miachel Roux M.D.   On: 02/26/2022 13:55   CT GUIDED PERITONEAL/RETROPERITONEAL FLUID DRAIN BY PERC CATH  Result Date: 02/26/2022 INDICATION: 66 year old gentleman with history of inflammatory bowel disease recently underwent exploratory laparotomy with descending and sigmoid colectomy. Patient has had intermittent fevers and recent CT shows left lower quadrant rim enhancing collection suspicious for abscess. EXAM: CT-guided left lower quadrant abdominal abscess drain placement TECHNIQUE: Multidetector CT imaging of the abdomen was performed following the standard protocol without IV contrast. RADIATION DOSE REDUCTION: This exam was performed according to the departmental dose-optimization program  which includes automated exposure control, adjustment of  the mA and/or kV according to patient size and/or use of iterative reconstruction technique. MEDICATIONS: The patient is currently admitted to the hospital and receiving intravenous antibiotics. The antibiotics were administered within an appropriate time frame prior to the initiation of the procedure. ANESTHESIA/SEDATION: Moderate (conscious) sedation was employed during this procedure. A total of Versed 1 mg and Fentanyl 75 mcg was administered intravenously by the radiology nurse. Total intra-service moderate Sedation Time: 37 minutes. The patient's level of consciousness and vital signs were monitored continuously by radiology nursing throughout the procedure under my direct supervision. COMPLICATIONS: None immediate. PROCEDURE: Informed written consent was obtained from the patient after a thorough discussion of the procedural risks, benefits and alternatives. All questions were addressed. Maximal Sterile Barrier Technique was utilized including caps, mask, sterile gowns, sterile gloves, sterile drape, hand hygiene and skin antiseptic. A timeout was performed prior to the initiation of the procedure. Patient positioned supine on the procedure table. The left lower quadrant abdominal skin prepped and draped usual fashion. Following local lidocaine administration, the left lower quadrant fluid collection was entered with a 19 gauge Yueh needle utilizing CT guidance. The 19 gauge Yueh catheter was removed over 0.035 inch guidewire and replaced with 10.2 [place] multipurpose pigtail drain. 5 mL parent sample was aspirated and sent for Gram stain and culture. Post placement CT confirmed appropriate positioning of the abscess drain. The drain was secured to skin with suture and connected to bulb suction. IMPRESSION: CT-guided left lower quadrant abdominal 10 French abscess drain placement. Electronically Signed   By: Miachel Roux M.D.   On: 02/26/2022 13:52     Lab Results: Recent Labs    02/26/22 0521 02/26/22 1606 02/27/22 0225 02/27/22 0812 02/27/22 1730 02/28/22 0410  WBC 4.8  --  5.3  --   --  5.4  HGB 8.1*   < > 7.8* 8.1* 8.2* 8.4*  HCT 25.1*   < > 24.6* 25.4* 26.0* 26.6*  PLT 205  --  203  --   --  223   < > = values in this interval not displayed.   BMET Recent Labs    02/26/22 0521 02/27/22 0225 02/28/22 0410  NA 140 140 140  K 3.5 3.5 4.2  CL 102 101 101  CO2 34* 33* 30  GLUCOSE 115* 187* 275*  BUN '11 11 11  '$ CREATININE <0.30* 0.42* 0.37*  CALCIUM 7.8* 8.0* 8.0*   LFT Recent Labs    02/28/22 0410  PROT 6.2*  ALBUMIN 2.7*  AST 16  ALT 10  ALKPHOS 98  BILITOT 0.6   PT/INR Recent Labs    02/26/22 0816  LABPROT 16.3*  INR 1.3*     Scheduled Meds:  ascorbic acid  500 mg Oral BID   atorvastatin  40 mg Oral Daily   Chlorhexidine Gluconate Cloth  6 each Topical Daily   feeding supplement  237 mL Oral TID BM   ferrous sulfate  325 mg Oral BID WC   insulin aspart  0-15 Units Subcutaneous TID WC   insulin aspart  0-5 Units Subcutaneous QHS   lip balm   Topical BID   methylPREDNISolone (SOLU-MEDROL) injection  40 mg Intravenous Daily   metoCLOPramide  5 mg Oral BID WC   metoprolol tartrate  6.25 mg Oral BID   multivitamin with minerals  1 tablet Oral Daily   pantoprazole  40 mg Oral Daily   polycarbophil  625 mg Oral BID   sodium chloride flush  10-40 mL Intracatheter Q12H  sodium chloride flush  5 mL Intracatheter Q8H   thiamine  100 mg Oral Daily   Continuous Infusions:  chlorproMAZINE (THORAZINE) 12.5 mg in sodium chloride 0.9 % 25 mL IVPB Stopped (02/14/22 0913)   metronidazole Stopped (02/28/22 0128)   piperacillin-tazobactam (ZOSYN)  IV Stopped (02/28/22 0431)      Patient profile:   66 year old male recently diagnosed IBD favoring Crohn's admitted for progressive abdominal pain post colonoscopy (11/20 with severe/diffuse ulcerations and colitis entire colon)  found a perforation  requiring urgent intervention with partial colectomy and transverse colostomy on 02/07/2022. Onset GI bleeding 02/25/2022 with flex showed multiple deep rectal ulceration status post Hemospray, rectal varices noted. IR drain placement left lower quadrant abscess 12/12 culture pending showed moderate gram-negative rods, status post IR paracentesis 750 cc culture pending.   Impression/Plan:   New diagnosis severe IBD likely Crohn's  status post perforation with emergency for [facility] laparotomy with sigmoid resection and transverse colostomy On Zosyn, added IV Flagyl 12/13 with new diffuse macular papular pruritic rash will discontinue Flagyl as this is likely the culprit.  Monitor closely Negative peritoneal fluid for culture, added on methylprednisone 40 mg IV daily 12/13 Ostium output 1900 cc over 24-hour on FiberCon twice daily, iron twice daily  Acute blood loss  Recent Labs    02/25/22 1201 02/25/22 1329 02/25/22 1827 02/26/22 0521 02/26/22 1606 02/26/22 2013 02/27/22 0225 02/27/22 0812 02/27/22 1730 02/28/22 0410  HGB 8.6* 8.1* 6.1* 8.1* 8.4* 8.3* 7.8* 8.1* 8.2* 8.4*  Anemia studies on 02/16/2022  Iron 17 Ferritin 632 B12 1,429  status post PRBCs 1 unit 12/9, 3 units 12/11 and 1 units 12/12 Recurrent bleeding 02/25/2022  CTA venous source of bleeding in rectum branch of SMV rectal varices noted, no surgical options flex sig showing deep rectal ulceration status post Hemospray rectal varices No further evidence of bleeding. currently on oral iron twice daily Hemoglobin stable today  Left lower quadrant abscess status post IR drainage on 12/12 Gram stain positive for gram-negative rods Culture reincubated for better growth, still pending On Zosyn  Free peritoneal fluid/ascites status post paracentesis 12/12 Likely representing fluid post perforation peritonitis No organisms, no growth at 24 hours  New maculopapular rash Stop Flagyl, monitor On methylprednisone 40 mg IV  since yesterday Continue hydrocortisone cream as needed  Can do Atarax 25 mg as needed for itching  Protein calorie malnutrition severe Secondary to above   Principal Problem:   Acute generalized peritonitis ([place]) Active Problems:   Perforated sigmoid colon ([place])   Protein-calorie malnutrition, severe   Ileus, postoperative ([employer])   Crohn's disease of colon with complication ([place])   Type 2 diabetes mellitus with hyperglycemia, without long-term current use of insulin (HCC)   Anxiety   Hypertension   Colon polyps   Colostomy in place [facility])   Acute blood loss anemia   Rectal bleeding   Abnormal CT scan, gastrointestinal tract   Sepsis ([place])   Left lower quadrant abdominal abscess ([place])   Rectal ulceration   Acute lower GI bleeding   Crohn's disease of colon with abscess ([place])   Inflammatory bowel disease   Acute metabolic encephalopathy   Perforated abdominal viscus   Intra-abdominal abscess ([place])    LOS: 20 days   Richard [name]  02/28/2022, 8:06 AM

## 2022-02-28 NOTE — Progress Notes (Deleted)
BP elevated and pt is moaning in pain. PRN fentanyl increased per order. Dressing change is due, pts pain not being controlled to allow him to roll to do the dressing change. Dressing remain dry and intact.

## 2022-02-28 NOTE — Assessment & Plan Note (Signed)
10 cm left lower quadrant abscess identified via CT imaging of the abdomen pelvis on 12/11 Patient is status post IR guided drain placement on 12/12 by Dr. Dwaine Gale JP drain actively exhibiting purulent drainage Continuing intravenous Zosyn, will de-escalate antibiotics based on eventual culture results Following aspirate culture

## 2022-02-28 NOTE — Progress Notes (Signed)
PROGRESS NOTE   Richard [name]  DUK:[id number] DOB: 1955-09-09 DOA: 02/07/2022 PCP: [name], MD   Date of Service: the patient was seen and examined on 02/28/2022  Brief Narrative:  Richard [name] is a 66 y.o. male with PMH significant for DM2, HTN, HLD, obesity, OSA, hypogonadism, peripheral neuropathy.  11/7, seen in the office by Dr. [name] with complaint of diarrhea for about 3 to 4 months, 6-8 episodes a day, mixed with blood associated with abdominal cramping and progressive weakness.   11/20, underwent colonoscopy, found to have diffuse colitis with deep ulcerations throughout the entire colon friable mucosa as well as extensive mucosal edema in the rectum.  He was started on prednisone 40 mg daily.  After colonoscopy, patient developed worsening abdominal pain. 11/23, at home patient had 1 episode of syncope.  Also had a fever.  Abdominal pain worsened and hence came to the ED.   In the ED, he had fever of 102.1 with tachycardia. CT abdomen pelvis showed perforated bowel with moderate free fluid and free air. Started on empiric IV antibiotics, IV fluid, and was admitted to ICU.  General surgery was consulted.    11/24, patient underwent exploratory laparotomy, descending and sigmoid colon resection, transverse colostomy by Dr. [name]. Surgical pathology showed transmural inflammation consistent with IBD.  His postop course was complicated by almost 2 weeks of postop ileus which gradually resolved.   He received TPN, with plan to taper on 02/23/2022.  He has also been evaluated by therapy and recommended for CIR with evaluation pending.   Assessment and Plan: * Acute generalized peritonitis ([place]) Secondary to sigmoid colon perforation Status post exploratory laparotomy with descending and sigmoid colon resection and transverse colostomy on 02/08/2022 Continuing intravenous Zosyn, we will de-escalate based on results of left lower quadrant abscess culture. Tolerating  oral intake Surgery and gastroenterology following, their input is appreciated  Acute lower GI bleeding Patient suffered significant lower GI bleeding on 12/11 Further episodes of bleeding since. Patient required 4 unit packed red blood cell transfusion on 12/11 Flex sigmoidoscopy performed on 12/12 revealing eight 10 to 50 mm ulcerations found in the mid rectum and distal rectum with application of pure stat hemostatic gel to the most deep lesion in the mid rectum. Avoiding anticoagulation Hemoglobin and hematocrit with serial CBCs with transfusion to be initiated for actively bleeding  Left lower quadrant abdominal abscess (HCC) 10 cm left lower quadrant abscess identified via CT imaging of the abdomen pelvis on 12/11 Patient is status post IR guided drain placement on 12/12 by Dr. Dwaine Gale JP drain actively exhibiting purulent drainage Continuing intravenous Zosyn, will de-escalate antibiotics based on eventual culture results Following aspirate culture  Sepsis ([place]) SIRS criteria resolving with draining abscess Fevers have resolved as well. Continuing intravenous antibiotic therapy with Zosyn for now, we will de-escalate based on abscess culture results Remainder of assessment and plan as above  Perforated sigmoid colon ([place]) Status post exploratory laparotomy with descending and sigmoid colon resection and transverse colostomy on 37/62/8315   Acute metabolic encephalopathy Notable daily improvement Supportive care, treating underlying conditions  Inflammatory bowel disease Discussed case with Dr. Rush Landmark today.  He wants to perform a colonoscopy through ostomy evaluation and since the patient is pending discharge to CIR, likely on Friday he will likely postpone this until the patient is transferred to CIR and then attempt to get this done while the patient is actively receiving rehab there. Biopsies earlier in the hospitalization consistent with inflammatory bowel disease  favorable towards Crohn's disease Additionally, numerous ulcerations noted on flexible sigmoidoscopy performed on 12/12 Flagyl originally initiated for Crohn's colitis being discontinued today due to diffuse erythematous rash. Gastroenterology has initiated intravenous Solu-Medrol 12/13 which will eventually be transitioned to oral therapy and outpatient immunomodulating therapy  Type 2 diabetes mellitus with hyperglycemia, without long-term current use of insulin (HCC) Worsening glycemia in the setting of ongoing steroid use for treatment of underlying inflammatory bowel disease Increasing intensity of sliding scale insulin Accu-Cheks before every meal and nightly Repeat hemoglobin A1c pending  Protein-calorie malnutrition, severe Patient has been transitioned off of TPN Patient is currently tolerating oral intake Nutrition following, their input is appreciated Ensure supplements 3 times a day between meals  Erythematous rash Development of diffuse nonpruritic erythematous rash over the past 24 hours GI feels that use of Flagyl may be the culprit since this was recently started for treatment of Crohn's colitis and has since been discontinued This is a reasonable first step and we will monitor for improvement If rash fails to improve with discontinuation of Flagyl Zosyn is also known to potentially cause rash and discontinuation will be considered at that time.    Subjective:  Patient complains of severe generalized weakness.  Patient complains of associated minimal abdominal discomfort, generalized and sore.  Patient is frustrated by his ongoing forgetfulness  Physical Exam:  Vitals:   02/28/22 1300 02/28/22 1400 02/28/22 1700 02/28/22 2002  BP: 131/64 135/72 (!) 122/56   Pulse: 98 96    Resp: (!) 23 (!) 23 (!) 25   Temp:   98.2 F (36.8 C) 97.7 F (36.5 C)  TempSrc:   Oral Oral  SpO2: 90% 90%    Weight:      Height:        Constitutional: Lethargic but arousable and  oriented x3, no associated distress.   Skin: Dressing of the anterior abdominal wall is clean dry and intact.   Eyes: Pupils are equally reactive to light.  No evidence of scleral icterus or conjunctival pallor.  ENMT: Moist mucous membranes noted.  Posterior pharynx clear of any exudate or lesions.   Respiratory: Mild bibasilar, no evidence of wheezing.  Normal respiratory effort. No accessory muscle use.  Cardiovascular: Regular rate and rhythm, no murmurs / rubs / gallops.  Significant distal bilateral lower extremity pitting edema.  2+ pedal pulses. No carotid bruits.  Abdomen: Ostomy in place revealing pink mucosa and brown loose stool.  Abdomen is slightly tender around the incision but soft.  No evidence of intra-abdominal masses.  Positive bowel sounds noted in all quadrants.   Musculoskeletal: No joint deformity upper and lower extremities. Good ROM, no contractures. Normal muscle tone.    Data Reviewed:  I have personally reviewed and interpreted labs, imaging.  Significant findings are   CBC: Recent Labs  Lab 02/25/22 0550 02/25/22 1201 02/25/22 1329 02/26/22 0521 02/26/22 1606 02/26/22 2013 02/27/22 0225 02/27/22 0812 02/27/22 1730 02/28/22 0410  WBC 4.9 5.9  --  4.8  --   --  5.3  --   --  5.4  NEUTROABS 3.7 4.7  --  3.7  --   --  4.0  --   --  4.8  HGB 7.7* 8.6*   < > 8.1*   < > 8.3* 7.8* 8.1* 8.2* 8.4*  HCT 24.9* 27.2*   < > 25.1*   < > 25.6* 24.6* 25.4* 26.0* 26.6*  MCV 102.0* 101.1*  --  95.4  --   --  97.2  --   --  97.8  PLT 254 296  --  205  --   --  203  --   --  223   < > = values in this interval not displayed.   Basic Metabolic Panel: Recent Labs  Lab 02/24/22 0530 02/25/22 0550 02/26/22 0521 02/27/22 0225 02/28/22 0410  NA 136 138 140 140 140  K 4.3 3.8 3.5 3.5 4.2  CL 101 100 102 101 101  CO2 31 32 34* 33* 30  GLUCOSE 160* 132* 115* 187* 275*  BUN '12 9 11 11 11  '$ CREATININE 0.31* 0.34* <0.30* 0.42* 0.37*  CALCIUM 7.8* 8.0* 7.8* 8.0* 8.0*   MG 1.7 1.6* 1.8 1.6* 1.8  PHOS 2.7 3.3 3.3 2.7 2.7   GFR: Estimated Creatinine Clearance: 113.7 mL/min (A) (by C-G formula based on SCr of 0.37 mg/dL (L)). Liver Function Tests: Recent Labs  Lab 02/24/22 0530 02/25/22 0550 02/26/22 0521 02/27/22 0225 02/28/22 0410  AST '24 20 15 16 16  '$ ALT '17 15 11 10 10  '$ ALKPHOS 138* 138* 84 80 98  BILITOT 0.4 0.6 0.7 0.4 0.6  PROT 5.9* 5.9* 5.4* 5.3* 6.2*  ALBUMIN <1.5* 2.0* 2.4* 2.8* 2.7*    Coagulation Profile: Recent Labs  Lab 02/26/22 0816  INR 1.3*     Code Status:  Full code.  Code status decision has been confirmed with: patient/wife Family Communication: Wife is at bedside and has been updated on plan of care   Severity of Illness:  The appropriate patient status for this patient is INPATIENT. Inpatient status is judged to be reasonable and necessary in order to provide the required intensity of service to ensure the patient's safety. The patient's presenting symptoms, physical exam findings, and initial radiographic and laboratory data in the context of their chronic comorbidities is felt to place them at high risk for further clinical deterioration. Furthermore, it is not anticipated that the patient will be medically stable for discharge from the hospital within 2 midnights of admission.   * I certify that at the point of admission it is my clinical judgment that the patient will require inpatient hospital care spanning beyond 2 midnights from the point of admission due to high intensity of service, high risk for further deterioration and high frequency of surveillance required.*  Time spent:  62 minutes  Author:  Vernelle Emerald MD  02/28/2022 8:26 PM

## 2022-02-28 NOTE — Assessment & Plan Note (Signed)
Development of diffuse nonpruritic erythematous rash over the past 24 hours GI feels that use of Flagyl may be the culprit since this was recently started for treatment of Crohn's colitis and has since been discontinued This is a reasonable first step and we will monitor for improvement If rash fails to improve with discontinuation of Flagyl Zosyn is also known to potentially cause rash and discontinuation will be considered at that time.

## 2022-02-28 NOTE — Progress Notes (Signed)
Pt continues to refuse the use of CPAP qhs.  Pt prefers to wear his nasal cannula while sleeping.

## 2022-02-28 NOTE — Assessment & Plan Note (Signed)
Patient has been transitioned off of TPN Patient is currently tolerating oral intake Nutrition following, their input is appreciated Ensure supplements 3 times a day between meals

## 2022-02-28 NOTE — Progress Notes (Signed)
Patient requested his abdominal dressing to be changed to qday (current order is for qshift). Reached out to Barkley Boards, PA-C with this request. See new order for qday dressing change.

## 2022-02-28 NOTE — Progress Notes (Signed)
Physical Therapy Treatment Patient Details Name: Richard [name] MRN: [id number] DOB: [birth year] Today's Date: 02/28/2022   History of Present Illness Patient is a 66 year old male who was admitted on 11/23 with bowel preforation. patient underwent exploratogy lap, sigmoid resection and transverse colostomy. PMH: IBD, crohn's colitis,COVID July 2022, HLD, HTN, OSA, PNA, Seborrheic dermatitis, Hypogonadism, DM type 2, and peripheral neuopathy. s/p drain placement 12/11 for abscess,  paracentesis 12/11 with 750cc removed. Patient developed multiple episodes of BRBPR on 12/11. Underwent 4 units PRBC transfusion and eval with GI for flex sig.    PT Comments    The patient appears  to be feeling better. Patient  mobilized to sitting. Stood x 2 at [employer] for standing  endurance to prepare for stepping. Patient reported dizziness, BP 121/61. 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 Continue PT, working towards AIR.    Recommendations for follow up therapy are one component of a multi-disciplinary discharge planning process, led by the attending physician.  Recommendations may be updated based on patient status, additional functional criteria and insurance authorization.  Follow Up Recommendations  Acute inpatient rehab (3hours/day)     Assistance Recommended at Discharge Frequent or constant Supervision/Assistance  Patient can return home with the following Two people to help with walking and/or transfers;Assistance with cooking/housework;Assist for transportation;Help with stairs or ramp for entrance;Two people to help with bathing/dressing/bathroom   Equipment Recommendations  Rolling walker (2 wheels)    Recommendations for Other Services       Precautions / Restrictions Precautions Precaution Comments: L colostomy, monitor o2/HR Required Braces or Orthoses: Other Brace Other Brace: binder was not in place      Mobility  Bed Mobility       Sidelying to sit: Min guard, HOB elevated     Sit to sidelying: Max assist General bed mobility comments: min guard, increased time, cuing, pt able to  push to sitting. max assist to place legs back onto bed    Transfers     Transfers: Sit to/from Stand Sit to Stand: Mod assist, From elevated surface, +2 physical assistance           General transfer comment: stood x 2 at [facility]. stood  ` 1 minute x 2 reporting dizziness. BP 121/61(close to last supine BP). HR 100 with activity. patient limited with reports of fatigue also. Assisted back to bed, attempted  chair position, did not tolerate.    Ambulation/Gait                   Stairs             Wheelchair Mobility    Modified Rankin (Stroke Patients Only)       Balance   Sitting-balance support: No upper extremity supported, Feet supported Sitting balance-Leahy Scale: Fair     Standing balance support: During functional activity, Reliant on assistive device for balance Standing balance-Leahy Scale: Poor                              Cognition Arousal/Alertness: Awake/alert Behavior During Therapy: Flat affect Overall Cognitive Status: Within Functional Limits for tasks assessed                                 General Comments: Slight improvement in affect,  Exercises      General Comments        Pertinent Vitals/Pain Pain Assessment Faces Pain Scale: Hurts a little bit Pain Location: abd Pain Descriptors / Indicators: Discomfort Pain Intervention(s): Monitored during session    Home Living                          Prior Function            PT Goals (current goals can now be found in the care plan section) Acute Rehab PT Goals Patient Stated Goal: to go outside, get out of room. Time For Goal Achievement: 03/14/22 Potential to Achieve Goals: Good Progress towards PT goals: Progressing toward  goals    Frequency    Min 4X/week      PT Plan Current plan remains appropriate;Frequency needs to be updated    Co-evaluation              AM-PAC PT "6 Clicks" Mobility   Outcome Measure  Help needed turning from your back to your side while in a flat bed without using bedrails?: A Lot Help needed moving from lying on your back to sitting on the side of a flat bed without using bedrails?: A Lot Help needed moving to and from a bed to a chair (including a wheelchair)?: Total Help needed standing up from a chair using your arms (e.g., wheelchair or bedside chair)?: Total Help needed to walk in hospital room?: Total Help needed climbing 3-5 steps with a railing? : Total 6 Click Score: 8    End of Session   Activity Tolerance: Patient limited by fatigue Patient left: with call bell/phone within reach;in chair;with family/visitor present Nurse Communication: Mobility status;Need for lift equipment PT Visit Diagnosis: Difficulty in walking, not elsewhere classified (R26.2);Pain;Muscle weakness (generalized) (M62.81)     Time: 1400-1435 PT Time Calculation (min) (ACUTE ONLY): 35 min  Charges:  $Therapeutic Activity: 23-37 mins                     [place] Office [phone] Weekend [phone]    Claretha Cooper 02/28/2022, 2:51 PM

## 2022-02-28 NOTE — Assessment & Plan Note (Signed)
Patient suffered significant lower GI bleeding on 12/11 Further episodes of bleeding since. Patient required 4 unit packed red blood cell transfusion on 12/11 Flex sigmoidoscopy performed on 12/12 revealing eight 10 to 50 mm ulcerations found in the mid rectum and distal rectum with application of pure stat hemostatic gel to the most deep lesion in the mid rectum. Avoiding anticoagulation Hemoglobin and hematocrit with serial CBCs with transfusion to be initiated for actively bleeding

## 2022-02-28 NOTE — Progress Notes (Signed)
Pt requested to have dressing change later on in the shift. He stated "my skin feels like it is irritated with the frequent dressing changes every shift. Dressings are intact, clean and dry.

## 2022-02-28 NOTE — Assessment & Plan Note (Signed)
Notable daily improvement Supportive care, treating underlying conditions

## 2022-02-28 NOTE — TOC Progression Note (Signed)
Transition of Care [facility]) - Progression Note    Patient Details  Name: VIGGO PERKO MRN: [id number] Date of Birth: 1955/09/14  Transition of Care [facility]) CM/SW [place], RN Phone Number: 02/28/2022, 3:53 PM  Clinical Narrative:  PT last seen patient on 02/28/22. CIR is awaiting insurance auth. TOC will continue to follow for disposition.    Expected Discharge Plan: IP Rehab Facility Barriers to Discharge: Continued Medical Work up  Expected Discharge Plan and Services Expected Discharge Plan: [place] In-house Referral: NA Discharge Planning Services: CM Consult   Living arrangements for the past 2 months: Single Family Home                           HH Arranged: PT [place]: [place] Date [place]: 02/13/22 Time [place]: [id number] Representative spoke with at [place]: [place] (Ali [place]) Interventions    Readmission Risk Interventions     No data to display

## 2022-02-28 NOTE — Consult Note (Addendum)
[place] Nurse ostomy follow up Surgical team following for assessment and plan of care for abd wound.   Ostomy pouch change performed by wife without assistance. Pt watched and asked appropriate questions. Stoma type/location: Stoma is edematous, above skin level, 2 3/4 inches, too large for a standard pouch. Mild maceration to lower stoma edges. Peristomal assessment: intact Output: 50 cc liquid brown stool Ostomy pouching: 2pc.  Education provided:  Performed pouch change. Wife was able to stretch barrier ring to fit the opening and apply 2 piece pouching system, stoma currently too large for a standard size pouch; use supplies: 4 inch pouching system, Kellie Simmering [phone] and  skin barrier ring is Kellie Simmering # [id number].  2 sets of supplies left at the bedside.  Pt and wife were able to open and close velcro to empty. Reviewed pouching routines and ordering supplies.  Enrolled patient in [place] Start Discharge program: Yes, previously [place] team will continue to follow next week for further teaching sessions.  Thank-you,  Julien Girt MSN, [place], [place], [place], [place]

## 2022-02-28 NOTE — Assessment & Plan Note (Signed)
Worsening glycemia in the setting of ongoing steroid use for treatment of underlying inflammatory bowel disease Increasing intensity of sliding scale insulin Accu-Cheks before every meal and nightly Repeat hemoglobin A1c pending

## 2022-02-28 NOTE — Assessment & Plan Note (Signed)
Secondary to sigmoid colon perforation Status post exploratory laparotomy with descending and sigmoid colon resection and transverse colostomy on 02/08/2022 Continuing intravenous Zosyn, we will de-escalate based on results of left lower quadrant abscess culture. Tolerating oral intake Surgery and gastroenterology following, their input is appreciated

## 2022-02-28 NOTE — Assessment & Plan Note (Signed)
Status post exploratory laparotomy with descending and sigmoid colon resection and transverse colostomy on 02/08/2022

## 2022-02-28 NOTE — Progress Notes (Signed)
Progress Note  3 Days Post-Op  Subjective: Started on steroids yesterday evening. Pt with some questions regarding possible side effects of steroids. Some questions regarding medical management of Crohn's as well. Denies uncontrolled pain. Tolerating diet and having ostomy output. Wife at bedside as well.   Objective: Vital signs in last 24 hours: Temp:  [99.2 F (37.3 C)-99.4 F (37.4 C)] 99.3 F (37.4 C) (12/14 0411) Pulse Rate:  [88-114] 88 (12/14 0400) Resp:  [20-33] 22 (12/14 0400) BP: (127-150)/(53-73) 150/73 (12/14 0400) SpO2:  [92 %-99 %] 92 % (12/14 0400) Weight:  [112.2 kg] 112.2 kg (12/14 0411) Last BM Date : 02/28/22  Intake/Output from previous day: 12/13 0701 - 12/14 0700 In: 340.6 [IV Piggyback:340.6] Out: 2096 [Urine:1750; Drains:95; Stool:251] Intake/Output this shift: No intake/output data recorded.  PE: General: pleasant, WD, obese male who is laying in bed in NAD Heart: regular, rate, and rhythm.   Lungs: Respiratory effort nonlabored Abd: soft, NT, mild distention, ostomy productive of stool, some ulceration on stoma but mostly viable and stoma is patent, midline wound clean with small amount fibrinous exudate centrally, drain in LLQ with purulent drainage MS: edema of LUE    Lab Results:  Recent Labs    02/27/22 0225 02/27/22 0812 02/27/22 1730 02/28/22 0410  WBC 5.3  --   --  5.4  HGB 7.8*   < > 8.2* 8.4*  HCT 24.6*   < > 26.0* 26.6*  PLT 203  --   --  223   < > = values in this interval not displayed.    BMET Recent Labs    02/27/22 0225 02/28/22 0410  NA 140 140  K 3.5 4.2  CL 101 101  CO2 33* 30  GLUCOSE 187* 275*  BUN 11 11  CREATININE 0.42* 0.37*  CALCIUM 8.0* 8.0*    PT/INR Recent Labs    02/26/22 0816  LABPROT 16.3*  INR 1.3*    CMP     Component Value Date/Time   NA 140 02/28/2022 0410   K 4.2 02/28/2022 0410   CL 101 02/28/2022 0410   CO2 30 02/28/2022 0410   GLUCOSE 275 (H) 02/28/2022 0410   BUN 11  02/28/2022 0410   CREATININE 0.37 (L) 02/28/2022 0410   CREATININE 0.63 (L) 03/10/2019 1436   CALCIUM 8.0 (L) 02/28/2022 0410   PROT 6.2 (L) 02/28/2022 0410   ALBUMIN 2.7 (L) 02/28/2022 0410   AST 16 02/28/2022 0410   ALT 10 02/28/2022 0410   ALKPHOS 98 02/28/2022 0410   BILITOT 0.6 02/28/2022 0410   GFRNONAA >60 02/28/2022 0410   Lipase  No results found for: "LIPASE"     Studies/Results: CT US GUIDED BIOPSY  Result Date: 02/26/2022 INDICATION: 66 year old gentleman with left lower quadrant abscess and right abdominal ascites presents to IR for paracentesis. EXAM: ULTRASOUND GUIDED  PARACENTESIS MEDICATIONS: None. COMPLICATIONS: None immediate. PROCEDURE: Informed written consent was obtained from the patient after a discussion of the risks, benefits and alternatives to treatment. A timeout was performed prior to the initiation of the procedure. Initial ultrasound scanning demonstrates a large amount of ascites within the right lower abdominal quadrant. The right lower abdomen was prepped and draped in the usual sterile fashion. 1% lidocaine was used for local anesthesia. Following this, a 19 gauge, 7-cm, Yueh catheter was introduced. An ultrasound image was saved for documentation purposes. The paracentesis was performed. The catheter was removed and a dressing was applied. The patient tolerated the procedure well without immediate post  procedural complication. FINDINGS: A total of approximately 750 mL of straw-colored fluid was removed. IMPRESSION: Ultrasound-guided paracentesis yielding 750 mL of clear straw-colored peritoneal fluid. Samples were sent for Gram stain and culture. Electronically Signed   By: Miachel Roux M.D.   On: 02/26/2022 13:55   CT GUIDED PERITONEAL/RETROPERITONEAL FLUID DRAIN BY PERC CATH  Result Date: 02/26/2022 INDICATION: 66 year old gentleman with history of inflammatory bowel disease recently underwent exploratory laparotomy with descending and sigmoid  colectomy. Patient has had intermittent fevers and recent CT shows left lower quadrant rim enhancing collection suspicious for abscess. EXAM: CT-guided left lower quadrant abdominal abscess drain placement TECHNIQUE: Multidetector CT imaging of the abdomen was performed following the standard protocol without IV contrast. RADIATION DOSE REDUCTION: This exam was performed according to the departmental dose-optimization program which includes automated exposure control, adjustment of the mA and/or kV according to patient size and/or use of iterative reconstruction technique. MEDICATIONS: The patient is currently admitted to the hospital and receiving intravenous antibiotics. The antibiotics were administered within an appropriate time frame prior to the initiation of the procedure. ANESTHESIA/SEDATION: Moderate (conscious) sedation was employed during this procedure. A total of Versed 1 mg and Fentanyl 75 mcg was administered intravenously by the radiology nurse. Total intra-service moderate Sedation Time: 37 minutes. The patient's level of consciousness and vital signs were monitored continuously by radiology nursing throughout the procedure under my direct supervision. COMPLICATIONS: None immediate. PROCEDURE: Informed written consent was obtained from the patient after a thorough discussion of the procedural risks, benefits and alternatives. All questions were addressed. Maximal Sterile Barrier Technique was utilized including caps, mask, sterile gowns, sterile gloves, sterile drape, hand hygiene and skin antiseptic. A timeout was performed prior to the initiation of the procedure. Patient positioned supine on the procedure table. The left lower quadrant abdominal skin prepped and draped usual fashion. Following local lidocaine administration, the left lower quadrant fluid collection was entered with a 19 gauge Yueh needle utilizing CT guidance. The 19 gauge Yueh catheter was removed over 0.035 inch guidewire and  replaced with 10.2 [place] multipurpose pigtail drain. 5 mL parent sample was aspirated and sent for Gram stain and culture. Post placement CT confirmed appropriate positioning of the abscess drain. The drain was secured to skin with suture and connected to bulb suction. IMPRESSION: CT-guided left lower quadrant abdominal 10 French abscess drain placement. Electronically Signed   By: Miachel Roux M.D.   On: 02/26/2022 13:52    Anti-infectives: Anti-infectives (From admission, onward)    Start     Dose/Rate Route Frequency Ordered Stop   02/27/22 1200  metroNIDAZOLE (FLAGYL) IVPB 500 mg        500 mg 100 mL/hr over 60 Minutes Intravenous Every 12 hours 02/27/22 1047     02/25/22 2000  piperacillin-tazobactam (ZOSYN) IVPB 3.375 g        3.375 g 12.5 mL/hr over 240 Minutes Intravenous Every 8 hours 02/25/22 1928     02/15/22 1400  meropenem (MERREM) 1 g in sodium chloride 0.9 % 100 mL IVPB  Status:  Discontinued        1 g 200 mL/hr over 30 Minutes Intravenous Every 8 hours 02/15/22 1244 02/19/22 1709   02/15/22 1400  vancomycin (VANCOCIN) IVPB 1000 mg/200 mL premix  Status:  Discontinued        1,000 mg 200 mL/hr over 60 Minutes Intravenous Every 12 hours 02/15/22 1304 02/18/22 1445   02/08/22 1200  piperacillin-tazobactam (ZOSYN) IVPB 3.375 g  Status:  Discontinued  3.375 g 12.5 mL/hr over 240 Minutes Intravenous Every 8 hours 02/08/22 0505 02/15/22 1243   02/08/22 0500  piperacillin-tazobactam (ZOSYN) IVPB 3.375 g  Status:  Discontinued        3.375 g 12.5 mL/hr over 240 Minutes Intravenous Every 8 hours 02/08/22 0006 02/08/22 0505   02/08/22 0334  piperacillin-tazobactam (ZOSYN) 3.375 GM/50ML IVPB       Note to Pharmacy: Enis Gash W: cabinet override      02/08/22 0334 02/08/22 0345   02/07/22 2030  ceFEPIme (MAXIPIME) 2 g in sodium chloride 0.9 % 100 mL IVPB        2 g 200 mL/hr over 30 Minutes Intravenous  Once 02/07/22 2023 02/07/22 2248   02/07/22 2030  metroNIDAZOLE  (FLAGYL) IVPB 500 mg        500 mg 100 mL/hr over 60 Minutes Intravenous  Once 02/07/22 2023 02/08/22 0506   02/07/22 2030  vancomycin (VANCOCIN) IVPB 1000 mg/200 mL premix        1,000 mg 200 mL/hr over 60 Minutes Intravenous  Once 02/07/22 2023 02/08/22 0506        Assessment/Plan  Sigmoid perforation  POD21 S/P exploratory laparotomy, colon resection, end colostomy 02/07/22 Dr. [name] - having ostomy output (1100 cc/24h) - on bid fibercon, add iron  - CT 12/1 with some free fluid but no organized collection, CT 12/5 with no major changes - BRBPR 12/11 - s/p flex-sig which showed bleeding ulcerations. CTA also showed LLQ abscess - hgb stable this AM, s/p 4 units PRBC 12/11 - s/p IR drain placement and paracentesis 12/12 - Cxs pending, continue zosyn for now - steroids for acute Crohn's initiated 12/13 - stable for CIR from a surgical standpoint    FEN: reg diet; cont iron and vit c VTE: LMWH on hold  ID: vanc/flagyl/cefepime 11/23; zosyn 11/24>11/29; vanc 12/1>12/4; merrem 12/1> 12/5; Zosyn 12/11>>   - below per TRH -  Post-operative respiratory insufficiency T2DM Physical deconditioning  Acute metabolic encephalopathy, improving  HTN HLD Obesity class I OSA Hypogonadism Moderate protein calorie malnutrition  ADHD/Anxiety    LOS: 20 days     Norm Parcel, [facility] 02/28/2022, 8:23 AM Please see Amion for pager number during day hours 7:00am-4:30pm

## 2022-02-28 NOTE — Assessment & Plan Note (Signed)
SIRS criteria resolving with draining abscess Fevers have resolved as well. Continuing intravenous antibiotic therapy with Zosyn for now, we will de-escalate based on abscess culture results Remainder of assessment and plan as above

## 2022-03-01 ENCOUNTER — Encounter (HOSPITAL_COMMUNITY): Payer: Self-pay | Admitting: Physical Medicine & Rehabilitation

## 2022-03-01 ENCOUNTER — Other Ambulatory Visit: Payer: Self-pay

## 2022-03-01 ENCOUNTER — Inpatient Hospital Stay (HOSPITAL_COMMUNITY)
Admission: RE | Admit: 2022-03-01 | Discharge: 2022-03-05 | DRG: 945 | Disposition: A | Discharge status: Other Acute Inpatient Hospital | Payer: 59 | Source: Intra-hospital | Attending: Physical Medicine & Rehabilitation | Admitting: Physical Medicine & Rehabilitation

## 2022-03-01 DIAGNOSIS — E669 Obesity, unspecified: Secondary | ICD-10-CM | POA: Diagnosis not present

## 2022-03-01 DIAGNOSIS — K567 Ileus, unspecified: Secondary | ICD-10-CM | POA: Diagnosis present

## 2022-03-01 DIAGNOSIS — K922 Gastrointestinal hemorrhage, unspecified: Secondary | ICD-10-CM | POA: Diagnosis not present

## 2022-03-01 DIAGNOSIS — K921 Melena: Secondary | ICD-10-CM

## 2022-03-01 DIAGNOSIS — Z8616 Personal history of COVID-19: Secondary | ICD-10-CM | POA: Diagnosis not present

## 2022-03-01 DIAGNOSIS — K9189 Other postprocedural complications and disorders of digestive system: Secondary | ICD-10-CM | POA: Diagnosis present

## 2022-03-01 DIAGNOSIS — I110 Hypertensive heart disease with heart failure: Secondary | ICD-10-CM | POA: Diagnosis present

## 2022-03-01 DIAGNOSIS — I509 Heart failure, unspecified: Secondary | ICD-10-CM | POA: Diagnosis not present

## 2022-03-01 DIAGNOSIS — Z9049 Acquired absence of other specified parts of digestive tract: Secondary | ICD-10-CM

## 2022-03-01 DIAGNOSIS — R188 Other ascites: Secondary | ICD-10-CM | POA: Diagnosis present

## 2022-03-01 DIAGNOSIS — L02211 Cutaneous abscess of abdominal wall: Secondary | ICD-10-CM | POA: Diagnosis present

## 2022-03-01 DIAGNOSIS — A4151 Sepsis due to Escherichia coli [E. coli]: Secondary | ICD-10-CM

## 2022-03-01 DIAGNOSIS — K650 Generalized (acute) peritonitis: Secondary | ICD-10-CM | POA: Diagnosis present

## 2022-03-01 DIAGNOSIS — E1165 Type 2 diabetes mellitus with hyperglycemia: Secondary | ICD-10-CM | POA: Diagnosis not present

## 2022-03-01 DIAGNOSIS — Z7984 Long term (current) use of oral hypoglycemic drugs: Secondary | ICD-10-CM

## 2022-03-01 DIAGNOSIS — K50118 Crohn's disease of large intestine with other complication: Secondary | ICD-10-CM | POA: Diagnosis present

## 2022-03-01 DIAGNOSIS — J309 Allergic rhinitis, unspecified: Secondary | ICD-10-CM | POA: Diagnosis present

## 2022-03-01 DIAGNOSIS — F909 Attention-deficit hyperactivity disorder, unspecified type: Secondary | ICD-10-CM | POA: Diagnosis not present

## 2022-03-01 DIAGNOSIS — I868 Varicose veins of other specified sites: Secondary | ICD-10-CM | POA: Diagnosis present

## 2022-03-01 DIAGNOSIS — E119 Type 2 diabetes mellitus without complications: Secondary | ICD-10-CM | POA: Diagnosis not present

## 2022-03-01 DIAGNOSIS — T8141XA Infection following a procedure, superficial incisional surgical site, initial encounter: Secondary | ICD-10-CM | POA: Diagnosis present

## 2022-03-01 DIAGNOSIS — K651 Peritoneal abscess: Secondary | ICD-10-CM | POA: Diagnosis not present

## 2022-03-01 DIAGNOSIS — E1169 Type 2 diabetes mellitus with other specified complication: Secondary | ICD-10-CM | POA: Diagnosis not present

## 2022-03-01 DIAGNOSIS — K641 Second degree hemorrhoids: Secondary | ICD-10-CM | POA: Diagnosis present

## 2022-03-01 DIAGNOSIS — R000 Tachycardia, unspecified: Secondary | ICD-10-CM | POA: Diagnosis present

## 2022-03-01 DIAGNOSIS — Z9109 Other allergy status, other than to drugs and biological substances: Secondary | ICD-10-CM

## 2022-03-01 DIAGNOSIS — Z8719 Personal history of other diseases of the digestive system: Secondary | ICD-10-CM

## 2022-03-01 DIAGNOSIS — E440 Moderate protein-calorie malnutrition: Secondary | ICD-10-CM | POA: Diagnosis present

## 2022-03-01 DIAGNOSIS — E876 Hypokalemia: Secondary | ICD-10-CM | POA: Diagnosis present

## 2022-03-01 DIAGNOSIS — N492 Inflammatory disorders of scrotum: Secondary | ICD-10-CM | POA: Diagnosis present

## 2022-03-01 DIAGNOSIS — L02215 Cutaneous abscess of perineum: Secondary | ICD-10-CM | POA: Diagnosis present

## 2022-03-01 DIAGNOSIS — Z79899 Other long term (current) drug therapy: Secondary | ICD-10-CM

## 2022-03-01 DIAGNOSIS — K626 Ulcer of anus and rectum: Secondary | ICD-10-CM | POA: Diagnosis present

## 2022-03-01 DIAGNOSIS — G9341 Metabolic encephalopathy: Secondary | ICD-10-CM | POA: Diagnosis present

## 2022-03-01 DIAGNOSIS — E873 Alkalosis: Secondary | ICD-10-CM | POA: Diagnosis present

## 2022-03-01 DIAGNOSIS — R0902 Hypoxemia: Secondary | ICD-10-CM | POA: Diagnosis not present

## 2022-03-01 DIAGNOSIS — J9811 Atelectasis: Secondary | ICD-10-CM | POA: Diagnosis not present

## 2022-03-01 DIAGNOSIS — K50911 Crohn's disease, unspecified, with rectal bleeding: Secondary | ICD-10-CM | POA: Diagnosis not present

## 2022-03-01 DIAGNOSIS — Z933 Colostomy status: Secondary | ICD-10-CM | POA: Diagnosis not present

## 2022-03-01 DIAGNOSIS — K625 Hemorrhage of anus and rectum: Secondary | ICD-10-CM | POA: Diagnosis not present

## 2022-03-01 DIAGNOSIS — Z91013 Allergy to seafood: Secondary | ICD-10-CM

## 2022-03-01 DIAGNOSIS — K631 Perforation of intestine (nontraumatic): Secondary | ICD-10-CM | POA: Diagnosis present

## 2022-03-01 DIAGNOSIS — D62 Acute posthemorrhagic anemia: Secondary | ICD-10-CM | POA: Diagnosis present

## 2022-03-01 DIAGNOSIS — K50111 Crohn's disease of large intestine with rectal bleeding: Secondary | ICD-10-CM | POA: Diagnosis present

## 2022-03-01 DIAGNOSIS — Z87891 Personal history of nicotine dependence: Secondary | ICD-10-CM

## 2022-03-01 DIAGNOSIS — Z794 Long term (current) use of insulin: Secondary | ICD-10-CM

## 2022-03-01 DIAGNOSIS — K659 Peritonitis, unspecified: Secondary | ICD-10-CM | POA: Diagnosis not present

## 2022-03-01 DIAGNOSIS — B9620 Unspecified Escherichia coli [E. coli] as the cause of diseases classified elsewhere: Secondary | ICD-10-CM | POA: Diagnosis present

## 2022-03-01 DIAGNOSIS — K50114 Crohn's disease of large intestine with abscess: Secondary | ICD-10-CM | POA: Diagnosis not present

## 2022-03-01 DIAGNOSIS — K50119 Crohn's disease of large intestine with unspecified complications: Secondary | ICD-10-CM | POA: Diagnosis not present

## 2022-03-01 DIAGNOSIS — K50918 Crohn's disease, unspecified, with other complication: Secondary | ICD-10-CM | POA: Diagnosis not present

## 2022-03-01 DIAGNOSIS — Z801 Family history of malignant neoplasm of trachea, bronchus and lung: Secondary | ICD-10-CM

## 2022-03-01 DIAGNOSIS — E8809 Other disorders of plasma-protein metabolism, not elsewhere classified: Secondary | ICD-10-CM | POA: Diagnosis present

## 2022-03-01 DIAGNOSIS — G4733 Obstructive sleep apnea (adult) (pediatric): Secondary | ICD-10-CM | POA: Diagnosis present

## 2022-03-01 DIAGNOSIS — F40240 Claustrophobia: Secondary | ICD-10-CM | POA: Diagnosis present

## 2022-03-01 DIAGNOSIS — E785 Hyperlipidemia, unspecified: Secondary | ICD-10-CM | POA: Diagnosis present

## 2022-03-01 DIAGNOSIS — R5381 Other malaise: Secondary | ICD-10-CM | POA: Diagnosis present

## 2022-03-01 DIAGNOSIS — R21 Rash and other nonspecific skin eruption: Secondary | ICD-10-CM | POA: Diagnosis present

## 2022-03-01 DIAGNOSIS — Z9689 Presence of other specified functional implants: Secondary | ICD-10-CM | POA: Diagnosis present

## 2022-03-01 DIAGNOSIS — J9621 Acute and chronic respiratory failure with hypoxia: Secondary | ICD-10-CM | POA: Diagnosis not present

## 2022-03-01 LAB — GLUCOSE, CAPILLARY
Glucose-Capillary: 224 mg/dL — ABNORMAL HIGH (ref 70–99)
Glucose-Capillary: 275 mg/dL — ABNORMAL HIGH (ref 70–99)
Glucose-Capillary: 263 mg/dL — ABNORMAL HIGH (ref 70–99)
Glucose-Capillary: 292 mg/dL — ABNORMAL HIGH (ref 70–99)

## 2022-03-01 LAB — COMPREHENSIVE METABOLIC PANEL
ALT: 10 U/L (ref 0–44)
AST: 15 U/L (ref 15–41)
Albumin: 2.3 g/dL — ABNORMAL LOW (ref 3.5–5.0)
Alkaline Phosphatase: 97 U/L (ref 38–126)
Anion gap: 9 (ref 5–15)
BUN: 16 mg/dL (ref 8–23)
CO2: 30 mmol/L (ref 22–32)
Calcium: 8 mg/dL — ABNORMAL LOW (ref 8.9–10.3)
Chloride: 103 mmol/L (ref 98–111)
Creatinine, Ser: 0.35 mg/dL — ABNORMAL LOW (ref 0.61–1.24)
GFR, Estimated: >60 mL/min (ref >60)
Glucose, Bld: 252 mg/dL — ABNORMAL HIGH (ref 70–99)
Potassium: 4 mmol/L (ref 3.5–5.1)
Sodium: 142 mmol/L (ref 135–145)
Total Bilirubin: 0.3 mg/dL (ref 0.3–1.2)
Total Protein: 5.8 g/dL — ABNORMAL LOW (ref 6.5–8.1)

## 2022-03-01 LAB — CBC WITH DIFFERENTIAL/PLATELET
Abs Immature Granulocytes: 0.21 10*3/uL — ABNORMAL HIGH (ref 0.00–0.07)
Basophils Absolute: 0 10*3/uL (ref 0.0–0.1)
Basophils Relative: 0 %
Eosinophils Absolute: 0 10*3/uL (ref 0.0–0.5)
Eosinophils Relative: 0 %
HCT: 25.9 % — ABNORMAL LOW (ref 39.0–52.0)
Hemoglobin: 8.1 g/dL — ABNORMAL LOW (ref 13.0–17.0)
Immature Granulocytes: 3 %
Lymphocytes Relative: 11 %
Lymphs Abs: 0.8 10*3/uL (ref 0.7–4.0)
MCH: 31.2 pg (ref 26.0–34.0)
MCHC: 31.3 g/dL (ref 30.0–36.0)
MCV: 99.6 fL (ref 80.0–100.0)
Monocytes Absolute: 0.5 10*3/uL (ref 0.1–1.0)
Monocytes Relative: 7 %
Neutro Abs: 5.7 10*3/uL (ref 1.7–7.7)
Neutrophils Relative %: 79 %
Platelets: 262 10*3/uL (ref 150–400)
RBC: 2.6 MIL/uL — ABNORMAL LOW (ref 4.22–5.81)
RDW: 15.3 % (ref 11.5–15.5)
WBC: 7.3 10*3/uL (ref 4.0–10.5)
nRBC: 0 % (ref 0.0–0.2)

## 2022-03-01 LAB — PHOSPHORUS: Phosphorus: 2.3 mg/dL — ABNORMAL LOW (ref 2.5–4.6)

## 2022-03-01 LAB — MAGNESIUM: Magnesium: 1.8 mg/dL (ref 1.7–2.4)

## 2022-03-01 MED ORDER — LIP MEDEX EX OINT
TOPICAL_OINTMENT | Freq: Two times a day (BID) | CUTANEOUS | Status: DC
  Administered 2022-03-01 – 2022-03-04 (×4): 75 via TOPICAL
  Filled 2022-03-01: qty 7

## 2022-03-01 MED ORDER — BISACODYL 10 MG RE SUPP: 10.0000 mg | Freq: Every day | RECTAL | Status: DC | PRN

## 2022-03-01 MED ORDER — MENTHOL 3 MG MT LOZG: 1.0000 | LOZENGE | OROMUCOSAL | Status: DC | PRN

## 2022-03-01 MED ORDER — INSULIN ASPART 100 UNIT/ML IJ SOLN
0.0000 [IU] | Freq: Every day | INTRAMUSCULAR | Status: DC
  Administered 2022-03-01: 3 [IU] via SUBCUTANEOUS
  Administered 2022-03-02: 2 [IU] via SUBCUTANEOUS
  Administered 2022-03-03: 4 [IU] via SUBCUTANEOUS
  Administered 2022-03-04: 3 [IU] via SUBCUTANEOUS

## 2022-03-01 MED ORDER — INSULIN ASPART 100 UNIT/ML IJ SOLN: 0.0000 [IU] | Freq: Every day | INTRAMUSCULAR | 11 refills | Status: DC

## 2022-03-01 MED ORDER — ACETAMINOPHEN 325 MG PO TABS: 325.0000 mg | ORAL_TABLET | ORAL | Status: DC | PRN

## 2022-03-01 MED ORDER — INSULIN ASPART 100 UNIT/ML IJ SOLN
0.0000 [IU] | Freq: Three times a day (TID) | INTRAMUSCULAR | Status: DC
  Administered 2022-03-01: 8 [IU] via SUBCUTANEOUS
  Administered 2022-03-02: 3 [IU] via SUBCUTANEOUS
  Administered 2022-03-02 (×2): 8 [IU] via SUBCUTANEOUS
  Administered 2022-03-03: 3 [IU] via SUBCUTANEOUS
  Administered 2022-03-03: 5 [IU] via SUBCUTANEOUS
  Administered 2022-03-03: 11 [IU] via SUBCUTANEOUS
  Administered 2022-03-04: 5 [IU] via SUBCUTANEOUS
  Administered 2022-03-04: 8 [IU] via SUBCUTANEOUS
  Administered 2022-03-04: 3 [IU] via SUBCUTANEOUS

## 2022-03-01 MED ORDER — METHYLPREDNISOLONE SODIUM SUCC 40 MG IJ SOLR: 40.0000 mg | Freq: Every day | INTRAMUSCULAR | 0 refills | Status: DC

## 2022-03-01 MED ORDER — DIPHENHYDRAMINE HCL 12.5 MG/5ML PO ELIX: 12.5000 mg | ORAL_SOLUTION | Freq: Four times a day (QID) | ORAL | Status: DC | PRN

## 2022-03-01 MED ORDER — ENSURE MAX PROTEIN PO LIQD
11.0000 [oz_av] | Freq: Two times a day (BID) | ORAL | Status: DC
  Administered 2022-03-01 – 2022-03-04 (×5): 11 [oz_av] via ORAL

## 2022-03-01 MED ORDER — VITAMIN B-1 100 MG PO TABS: 100.0000 mg | ORAL_TABLET | Freq: Every day | ORAL | Status: AC

## 2022-03-01 MED ORDER — CEFTRIAXONE SODIUM 1 G IJ SOLR: 2.0000 g | INTRAMUSCULAR | 0 refills | Status: DC

## 2022-03-01 MED ORDER — METRONIDAZOLE 500 MG/100ML IV SOLN: 500.0000 mg | Freq: Two times a day (BID) | INTRAVENOUS | Status: DC

## 2022-03-01 MED ORDER — METRONIDAZOLE 500 MG/100ML IV SOLN
500.0000 mg | Freq: Two times a day (BID) | INTRAVENOUS | Status: DC
  Administered 2022-03-01 – 2022-03-05 (×8): 500 mg via INTRAVENOUS
  Filled 2022-03-01 (×8): qty 100

## 2022-03-01 MED ORDER — HYDROCORTISONE 0.5 % EX CREA: TOPICAL_CREAM | Freq: Two times a day (BID) | CUTANEOUS | Status: DC | PRN

## 2022-03-01 MED ORDER — CALCIUM POLYCARBOPHIL 625 MG PO TABS: 625.0000 mg | ORAL_TABLET | Freq: Two times a day (BID) | ORAL | Status: DC

## 2022-03-01 MED ORDER — ACETAMINOPHEN 325 MG PO TABS: 650.0000 mg | ORAL_TABLET | ORAL | Status: DC | PRN

## 2022-03-01 MED ORDER — GUAIFENESIN-DM 100-10 MG/5ML PO SYRP: 5.0000 mL | ORAL_SOLUTION | Freq: Four times a day (QID) | ORAL | Status: DC | PRN

## 2022-03-01 MED ORDER — ADULT MULTIVITAMIN W/MINERALS CH: 1.0000 | ORAL_TABLET | Freq: Every day | ORAL | Status: AC

## 2022-03-01 MED ORDER — ATORVASTATIN CALCIUM 40 MG PO TABS
40.0000 mg | ORAL_TABLET | Freq: Every day | ORAL | Status: DC
  Administered 2022-03-02 – 2022-03-04 (×3): 40 mg via ORAL
  Filled 2022-03-01 (×3): qty 1

## 2022-03-01 MED ORDER — FLEET ENEMA 7-19 GM/118ML RE ENEM: 1.0000 | ENEMA | Freq: Once | RECTAL | Status: DC | PRN

## 2022-03-01 MED ORDER — HYDROXYZINE HCL 25 MG PO TABS: 25.0000 mg | ORAL_TABLET | Freq: Three times a day (TID) | ORAL | 0 refills | Status: DC | PRN

## 2022-03-01 MED ORDER — PROCHLORPERAZINE MALEATE 5 MG PO TABS: 5.0000 mg | ORAL_TABLET | Freq: Four times a day (QID) | ORAL | Status: DC | PRN

## 2022-03-01 MED ORDER — ONDANSETRON HCL 4 MG/2ML IJ SOLN: 4.0000 mg | Freq: Four times a day (QID) | INTRAMUSCULAR | Status: DC | PRN

## 2022-03-01 MED ORDER — FERROUS SULFATE 325 (65 FE) MG PO TABS: 325.0000 mg | ORAL_TABLET | Freq: Two times a day (BID) | ORAL | 3 refills | Status: DC

## 2022-03-01 MED ORDER — PHENOL 1.4 % MT LIQD: 2.0000 | OROMUCOSAL | Status: DC | PRN

## 2022-03-01 MED ORDER — JUVEN PO PACK
1.0000 | PACK | Freq: Two times a day (BID) | ORAL | Status: DC
  Administered 2022-03-02 – 2022-03-04 (×5): 1 via ORAL
  Filled 2022-03-01 (×6): qty 1

## 2022-03-01 MED ORDER — ENSURE ENLIVE PO LIQD: 237.0000 mL | Freq: Three times a day (TID) | ORAL | 12 refills | Status: DC

## 2022-03-01 MED ORDER — METOPROLOL TARTRATE 25 MG PO TABS: 25.0000 mg | ORAL_TABLET | Freq: Two times a day (BID) | ORAL | 11 refills | Status: DC

## 2022-03-01 MED ORDER — SODIUM CHLORIDE 0.9% FLUSH: 10.0000 mL | INTRAVENOUS | Status: DC | PRN

## 2022-03-01 MED ORDER — PROSOURCE PLUS PO LIQD
30.0000 mL | Freq: Three times a day (TID) | ORAL | Status: DC
  Administered 2022-03-01 – 2022-03-04 (×9): 30 mL via ORAL
  Filled 2022-03-01 (×10): qty 30

## 2022-03-01 MED ORDER — MAGIC MOUTHWASH: 15.0000 mL | Freq: Four times a day (QID) | ORAL | Status: DC | PRN

## 2022-03-01 MED ORDER — THIAMINE MONONITRATE 100 MG PO TABS
100.0000 mg | ORAL_TABLET | Freq: Every day | ORAL | Status: DC
  Administered 2022-03-02 – 2022-03-04 (×3): 100 mg via ORAL
  Filled 2022-03-01 (×3): qty 1

## 2022-03-01 MED ORDER — ADULT MULTIVITAMIN W/MINERALS CH
1.0000 | ORAL_TABLET | Freq: Every day | ORAL | Status: DC
  Administered 2022-03-02 – 2022-03-04 (×3): 1 via ORAL
  Filled 2022-03-01 (×3): qty 1

## 2022-03-01 MED ORDER — PROCHLORPERAZINE 25 MG RE SUPP: 12.5000 mg | Freq: Four times a day (QID) | RECTAL | Status: DC | PRN

## 2022-03-01 MED ORDER — ENSURE MAX PROTEIN PO LIQD: 11.0000 [oz_av] | Freq: Two times a day (BID) | ORAL | Status: DC

## 2022-03-01 MED ORDER — HYDROXYZINE HCL 25 MG PO TABS: 25.0000 mg | ORAL_TABLET | Freq: Three times a day (TID) | ORAL | Status: DC | PRN

## 2022-03-01 MED ORDER — ACETAMINOPHEN 325 MG PO TABS: 650.0000 mg | ORAL_TABLET | ORAL | Status: AC | PRN

## 2022-03-01 MED ORDER — METHYLPREDNISOLONE SODIUM SUCC 125 MG IJ SOLR
40.0000 mg | Freq: Every day | INTRAMUSCULAR | Status: DC
  Administered 2022-03-02 – 2022-03-04 (×3): 40 mg via INTRAVENOUS
  Filled 2022-03-01 (×3): qty 2

## 2022-03-01 MED ORDER — ORAL CARE MOUTH RINSE: 15.0000 mL | OROMUCOSAL | Status: DC | PRN

## 2022-03-01 MED ORDER — NAPHAZOLINE-GLYCERIN 0.012-0.25 % OP SOLN: 1.0000 [drp] | Freq: Four times a day (QID) | OPHTHALMIC | Status: DC | PRN

## 2022-03-01 MED ORDER — PANTOPRAZOLE SODIUM 40 MG PO TBEC: 40.0000 mg | DELAYED_RELEASE_TABLET | Freq: Every day | ORAL | Status: DC

## 2022-03-01 MED ORDER — PIPERACILLIN-TAZOBACTAM 3.375 G IVPB
3.3750 g | Freq: Three times a day (TID) | INTRAVENOUS | Status: DC
  Filled 2022-03-01: qty 50

## 2022-03-01 MED ORDER — SIMETHICONE 40 MG/0.6ML PO SUSP: 80.0000 mg | Freq: Four times a day (QID) | ORAL | Status: DC | PRN

## 2022-03-01 MED ORDER — POLYETHYLENE GLYCOL 3350 17 G PO PACK: 17.0000 g | PACK | Freq: Every day | ORAL | Status: DC | PRN

## 2022-03-01 MED ORDER — SODIUM CHLORIDE 0.9 % IV SOLN: INTRAVENOUS | Status: DC | PRN

## 2022-03-01 MED ORDER — SODIUM CHLORIDE 0.9% FLUSH
10.0000 mL | Freq: Two times a day (BID) | INTRAVENOUS | Status: DC
  Administered 2022-03-02 – 2022-03-04 (×5): 10 mL

## 2022-03-01 MED ORDER — CALCIUM POLYCARBOPHIL 625 MG PO TABS
625.0000 mg | ORAL_TABLET | Freq: Two times a day (BID) | ORAL | Status: DC
  Administered 2022-03-01 – 2022-03-04 (×5): 625 mg via ORAL
  Filled 2022-03-01 (×5): qty 1

## 2022-03-01 MED ORDER — ALUM & MAG HYDROXIDE-SIMETH 200-200-20 MG/5ML PO SUSP: 30.0000 mL | ORAL | Status: DC | PRN

## 2022-03-01 MED ORDER — PROCHLORPERAZINE EDISYLATE 10 MG/2ML IJ SOLN: 5.0000 mg | Freq: Four times a day (QID) | INTRAMUSCULAR | Status: DC | PRN

## 2022-03-01 MED ORDER — ALUM & MAG HYDROXIDE-SIMETH 200-200-20 MG/5ML PO SUSP: 30.0000 mL | Freq: Four times a day (QID) | ORAL | 0 refills | Status: DC | PRN

## 2022-03-01 MED ORDER — METOCLOPRAMIDE HCL 5 MG PO TABS
5.0000 mg | ORAL_TABLET | Freq: Two times a day (BID) | ORAL | Status: DC
  Administered 2022-03-02 (×2): 5 mg via ORAL
  Filled 2022-03-01 (×2): qty 1

## 2022-03-01 MED ORDER — FERROUS SULFATE 325 (65 FE) MG PO TABS
325.0000 mg | ORAL_TABLET | Freq: Two times a day (BID) | ORAL | Status: DC
  Administered 2022-03-01 – 2022-03-04 (×6): 325 mg via ORAL
  Filled 2022-03-01 (×7): qty 1

## 2022-03-01 MED ORDER — METOPROLOL TARTRATE 25 MG/10 ML ORAL SUSPENSION
6.2500 mg | Freq: Two times a day (BID) | ORAL | Status: DC
  Administered 2022-03-01 – 2022-03-04 (×7): 6.25 mg via ORAL
  Filled 2022-03-01 (×8): qty 5

## 2022-03-01 MED ORDER — VITAMIN C 500 MG PO TABS
500.0000 mg | ORAL_TABLET | Freq: Two times a day (BID) | ORAL | Status: DC
  Administered 2022-03-01 – 2022-03-04 (×7): 500 mg via ORAL
  Filled 2022-03-01 (×7): qty 1

## 2022-03-01 MED ORDER — ONDANSETRON HCL 4 MG/2ML IJ SOLN: 4.0000 mg | Freq: Four times a day (QID) | INTRAMUSCULAR | 0 refills | Status: DC | PRN

## 2022-03-01 MED ORDER — SALINE SPRAY 0.65 % NA SOLN: 1.0000 | NASAL | Status: DC | PRN

## 2022-03-01 MED ORDER — SODIUM CHLORIDE 0.9 % IV SOLN
2.0000 g | INTRAVENOUS | Status: DC
  Administered 2022-03-01 – 2022-03-04 (×4): 2 g via INTRAVENOUS
  Filled 2022-03-01 (×4): qty 20

## 2022-03-01 MED ORDER — TRAZODONE HCL 50 MG PO TABS: 25.0000 mg | ORAL_TABLET | Freq: Every evening | ORAL | Status: DC | PRN

## 2022-03-01 MED ORDER — ASCORBIC ACID 500 MG PO TABS: 500.0000 mg | ORAL_TABLET | Freq: Two times a day (BID) | ORAL | Status: AC

## 2022-03-01 MED ORDER — METOCLOPRAMIDE HCL 5 MG PO TABS: 5.0000 mg | ORAL_TABLET | Freq: Two times a day (BID) | ORAL | Status: DC

## 2022-03-01 MED ORDER — PANTOPRAZOLE SODIUM 40 MG PO TBEC
40.0000 mg | DELAYED_RELEASE_TABLET | Freq: Every day | ORAL | Status: DC
  Administered 2022-03-02 – 2022-03-04 (×3): 40 mg via ORAL
  Filled 2022-03-01 (×3): qty 1

## 2022-03-01 MED ORDER — CEFTRIAXONE SODIUM 1 G IJ SOLR
2.0000 g | INTRAMUSCULAR | Status: DC
  Filled 2022-03-01: qty 20

## 2022-03-01 MED ORDER — INSULIN ASPART 100 UNIT/ML IJ SOLN: 0.0000 [IU] | Freq: Three times a day (TID) | INTRAMUSCULAR | 11 refills | Status: DC

## 2022-03-01 NOTE — Progress Notes (Addendum)
Signed     PMR Admission Coordinator Pre-Admission Assessment   Patient: Richard [name] is an 66 y.o., male MRN: [id number] DOB: Jan 10, 1956 Height: _0  (175.3 cm) Weight: 118.4 kg   Insurance Information HMO:     PPO: yes     PCP:      IPA:      80/20:      OTHER:  PRIMARY: C[employer] Generic      Policy#: [id number]      Subscriber: patient CM Name: Delray Alt      Phone#: [phone]     Fax#: [phone] Pre-Cert#: [id number] Received approval on 02/28/22 from Delray Alt. Pt approved for 7 days starting 03/01/22. Updates due 03/08/22 by 1pm CST.     Employer:  Benefits:  Phone #: [phone]     Name:  Eff. Date: 03/18/18-still active     Deduct: $1,000 ($1,000 met)      Out of Pocket Max: $5,500 ($5,500 met)      Life Max: NA CIR: $300 co-pay/admission, then 70% coverage, 30% co-insurance      SNF: 70% coverage, 30% co-insurance Outpatient: 70% coverage, 30% co-insurance     Co-Pay:  Home Health: 70% coverage      Co-Pay: 30% co-insurance DME: 70% coverage     Co-Pay: 30% co-insurance Providers: in-network SECONDARY:  not yet enrolled in Medicare      Policy#:      Phone#:    Development worker, community:       Phone#:    The Engineer, petroleum" for patients in Inpatient Rehabilitation Facilities with attached "Privacy Act [place] Records" was provided and verbally reviewed with: Patient   Emergency Contact Information Contact Information       Name Relation Home Work Mobile    [place] Spouse [phone]   [phone]           Current Medical History  Patient Admitting Diagnosis: Peritonitis, Colon perforation  History of Present Illness: Pt is a 66 yo male former smoker who  developed diarrhea about 3 to 4 weeks prior to admission.  He was seen by Dr. [name] with GI and had colonoscopy on 02/04/22.  This showed diffuse, severe inflammation throughout the colon with mucosal friability. Biopsies were performed.  He  was found to have Crohn's colitis and started on prednisone.  He started having discomfort in his abdomen, and  on 02/08/22 Pt. Was brought to the [facility] ED. On admission, he had fever 102.1 F with tachycardia.  CT abd/pelvis showed findings compatible with perforated bowel with moderate free fluid and free air.  He was started on antibiotics and IV fluids.  Surgery consulted to assess for emergent laparotomy. On 11/24, patient underwent exploratory laparotomy, descending and sigmoid colon resection, transverse colostomy by Dr. [name].Surgical pathology showed transmural inflammation consistent with IBD. His postop course was complicated by almost 2 weeks of postop ileus which gradually resolved.  In the 2 weeks duration, he had significant fluid and electrolyte imbalance, hypoxia, fever, encephalopathy and physical deconditioning.  He received TPN, discontinued on 02/23/2022,  as he is tolerating a solid diet.. Pt. Also with ABL post op, received 1 unit PRBCS  12/9. Pt. Seen by PT/OT who recommended CIR to assist return to PLOF.   Patient's medical record from [facility]  has been reviewed by the rehabilitation admission coordinator and physician.   Past Medical History      Past Medical History:  Diagnosis Date   Adult ADHD  Allergic rhinitis     Anxiety      claustrophobic   Bronchopneumonia 11/03/2013   Colon polyps 2012 and 2013    All hyperplastic except one adenomatous w/out high grade dysplasia (recall 2023 per Dr. Deatra Ina)   COVID-19 virus infection 09/29/2020   Elevated transaminase level 2017    Viral hep screening neg.  Suspect fatty liver.   Hyperlipidemia     Hypertension     Obesity     OSA on CPAP      Severe.  CPAP 10 cm H2O.  Follows Dr. Ander Slade.   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 [name] to do surg   Pneumonia     Seborrheic dermatitis of scalp      and face: hydrocort 2% cream bid prn to face, and fluocinonide 0.5% sol'n  to scalp bid prn per Dr. Allyson Sabal (04/2014)   Secondary male hypogonadism 04/2021   Type 2 diabetes mellitus with microalbuminuria ([place]) DM dx-09/27/2015. Microalb 2020      Has the patient had major surgery during 100 days prior to admission? Yes   Family History   family history includes Allergies in his father; Cancer in his father; Lung cancer in his father.   Current Medications   Current Facility-Administered Medications:    0.9 %  sodium chloride infusion (Manually program via Guardrails IV Fluids), , Intravenous, Once, Dwyane Dee, MD   0.9 %  sodium chloride infusion (Manually program via Guardrails IV Fluids), , Intravenous, Once, Dwyane Dee, MD   acetaminophen (OFIRMEV) IV 1,000 mg, 1,000 mg, Intravenous, Q6H, Dwyane Dee, MD, Stopped at 02/26/22 0028   acetaminophen (TYLENOL) tablet 650 mg, 650 mg, Oral, Q4H PRN, Dwyane Dee, MD   albumin human 25 % solution 25 g, 25 g, Intravenous, Q6H, Dwyane Dee, MD, Last Rate: 60 mL/hr at 02/26/22 0526, 25 g at 02/26/22 0526   alum & mag hydroxide-simeth (MAALOX/MYLANTA) 200-200-20 MG/5ML suspension 30 mL, 30 mL, Oral, Q6H PRN, Michael Boston, MD   ascorbic acid (VITAMIN C) tablet 500 mg, 500 mg, Oral, BID, Hall, Carole N, DO, 500 mg at 02/25/22 0901   atorvastatin (LIPITOR) tablet 40 mg, 40 mg, Oral, Daily, Gross, Remo Lipps, MD, 40 mg at 02/25/22 0901   Chlorhexidine Gluconate Cloth 2 % PADS 6 each, 6 each, Topical, Daily, Chesley Mires, MD, 6 each at 02/26/22 0600   chlorproMAZINE (THORAZINE) 12.5 mg in sodium chloride 0.9 % 25 mL IVPB, 12.5 mg, Intravenous, Q6H PRN, Simaan, Elizabeth S, PA-C, Stopped at 02/14/22 0913   feeding supplement (ENSURE ENLIVE / ENSURE PLUS) liquid 237 mL, 237 mL, Oral, TID BM, Esterwood, Amy S, PA-C   furosemide (LASIX) injection 40 mg, 40 mg, Intravenous, Daily, Girguis, David, MD, 40 mg at 02/25/22 0900   HYDROmorphone (DILAUDID) injection 0.5-1 mg, 0.5-1 mg, Intravenous, G8J PRN, Leighton Ruff, MD, 1  mg at 85/63/14 0609   insulin aspart (novoLOG) injection 0-15 Units, 0-15 Units, Subcutaneous, TID WC, BellRonaldo Miyamoto, RPH, 3 Units at 02/25/22 1658   insulin aspart (novoLOG) injection 0-5 Units, 0-5 Units, Subcutaneous, QHS, Bell, Michelle T, RPH   lip balm (CARMEX) ointment, , Topical, BID, Michael Boston, MD, Given at 02/25/22 0902   magic mouthwash, 15 mL, Oral, QID PRN, Michael Boston, MD   magnesium sulfate IVPB 2 g 50 mL, 2 g, Intravenous, Once, Dwyane Dee, MD   menthol-cetylpyridinium (CEPACOL) lozenge 3 mg, 1 lozenge, Oral, PRN, Michael Boston, MD   metoCLOPramide (REGLAN) tablet 5 mg, 5 mg, Oral, BID  WC, Daryel November, MD, 5 mg at 02/25/22 1338   metoprolol tartrate (LOPRESSOR) 25 mg/10 mL oral suspension 6.25 mg, 6.25 mg, Oral, BID, Dwyane Dee, MD, 6.25 mg at 02/25/22 0909   multivitamin with minerals tablet 1 tablet, 1 tablet, Oral, Daily, Eudelia Bunch, RPH, 1 tablet at 02/25/22 0901   naphazoline-glycerin (CLEAR EYES REDNESS) ophth solution 1-2 drop, 1-2 drop, Both Eyes, QID PRN, Michael Boston, MD   ondansetron [facility]) injection 4 mg, 4 mg, Intravenous, Y3K PRN, Leighton Ruff, MD, 4 mg at 16/01/09 1446   Oral care mouth rinse, 15 mL, Mouth Rinse, PRN, Allie Bossier, MD, 15 mL at 02/16/22 1000   pantoprazole (PROTONIX) EC tablet 40 mg, 40 mg, Oral, Daily, Eudelia Bunch, RPH, 40 mg at 02/25/22 0901   phenol (CHLORASEPTIC) mouth spray 2 spray, 2 spray, Mouth/Throat, PRN, Michael Boston, MD   piperacillin-tazobactam (ZOSYN) IVPB 3.375 g, 3.375 g, Intravenous, Q8H, Bell, Ronaldo Miyamoto, RPH, Last Rate: 12.5 mL/hr at 02/26/22 0529, 3.375 g at 02/26/22 0529   polycarbophil (FIBERCON) tablet 625 mg, 625 mg, Oral, BID, Michael Boston, MD, 625 mg at 02/25/22 0901   potassium chloride SA (KLOR-CON M) CR tablet 40 mEq, 40 mEq, Oral, Once, Dwyane Dee, MD   simethicone (MYLICON) 40 NA/3.5TD suspension 80 mg, 80 mg, Oral, QID PRN, Michael Boston, MD   sodium chloride (OCEAN)  0.65 % nasal spray 1-2 spray, 1-2 spray, Each Nare, PRN, Dwyane Dee, MD   sodium chloride flush (NS) 0.9 % injection 10-40 mL, 10-40 mL, Intracatheter, Q12H, Dahal, Binaya, MD, 20 mL at 02/25/22 2300   sodium chloride flush (NS) 0.9 % injection 10-40 mL, 10-40 mL, Intracatheter, PRN, Dahal, Binaya, MD, 10 mL at 02/16/22 1849   thiamine (VITAMIN B1) tablet 100 mg, 100 mg, Oral, Daily, Eudelia Bunch, RPH, 100 mg at 02/25/22 0901   Patients Current Diet:  Diet Order                  Diet NPO time specified  Diet effective now                         Precautions / Restrictions Precautions Precautions: Other (comment) Precaution Comments: L colostomy, monitor o2/HR Other Brace: binder was not in place Restrictions Weight Bearing Restrictions: No    Has the patient had 2 or more falls or a fall with injury in the past year? No   Prior Activity Level Community (5-7x/wk): Pt. active in the community PTA   Prior Functional Level Self Care: Did the patient need help bathing, dressing, using the toilet or eating? Independent   Indoor Mobility: Did the patient need assistance with walking from room to room (with or without device)? Independent   Stairs: Did the patient need assistance with internal or external stairs (with or without device)? Independent   Functional Cognition: Did the patient need help planning regular tasks such as shopping or remembering to take medications? Independent   Patient Information Are you of Hispanic, Latino/a,or Spanish origin?: A. No, not of Hispanic, Latino/a, or Spanish origin What is your race?: A. White Do you need or want an interpreter to communicate with a doctor or health care staff?: 0. No   Patient's Response To:  Health Literacy and Transportation Is the patient able to respond to health literacy and transportation needs?: Yes Health Literacy - How often do you need to have someone help you when you read instructions, pamphlets,  or  other written material from your doctor or pharmacy?: Never In the past 12 months, has lack of transportation kept you from medical appointments or from getting medications?: No In the past 12 months, has lack of transportation kept you from meetings, work, or from getting things needed for daily living?: No   Development worker, international aid / [place] Devices/Equipment: Eyeglasses Home Equipment: None   Prior Device Use: Indicate devices/aids used by the patient prior to current illness, exacerbation or injury? Walker   Current Functional Level Cognition   Overall Cognitive Status: Within Functional Limits for tasks assessed Orientation Level: Oriented to person, Oriented to situation, Oriented to place, Disoriented to time General Comments: Cracking jokes today    Extremity Assessment (includes Sensation/Coordination)   Upper Extremity Assessment: Overall WFL for tasks assessed  Lower Extremity Assessment: Defer to PT evaluation RLE Sensation: history of peripheral neuropathy LLE Sensation: history of peripheral neuropathy     ADLs   Overall ADL's : Needs assistance/impaired Eating/Feeding Details (indicate cue type and reason): can only have some ice chips at this time. Grooming: Set up, Sitting Upper Body Bathing: Moderate assistance Upper Body Bathing Details (indicate cue type and reason): too many lines and leads to assess fully at this time Lower Body Bathing: Total assistance, Bed level Upper Body Dressing : Maximal assistance, Sitting Lower Body Dressing: Bed level, Total assistance Toilet Transfer: +2 for safety/equipment, +2 for physical assistance Toilet Transfer Details (indicate cue type and reason): patient attempted sitting EOB with noted cramps in BLE with repositioning on edge of bed. returned to supine with +2 Toileting- Clothing Manipulation and Hygiene: Total assistance, Bed level Functional mobility during ADLs: +2 for physical assistance, +2 for  safety/equipment     Mobility   Overal bed mobility: Needs Assistance Bed Mobility: Rolling, Sidelying to Sit, Sit to Sidelying Rolling: Min assist Sidelying to sit: Min assist, HOB elevated Sit to supine: +2 for physical assistance, +2 for safety/equipment, Max assist Sit to sidelying: Total assist, +2 for physical assistance, +2 for safety/equipment General bed mobility comments: Needed a lot of encouragement and increased time to physicall move himself. Overall min assist to pull up on therapist hand to come into sitting.     Transfers   Overall transfer level: Needs assistance Equipment used: Rolling walker (2 wheels) Transfers: Sit to/from Stand Sit to Stand: Min assist, +2 physical assistance, From elevated surface Bed to/from chair/wheelchair/BSC transfer type:: Via Lift equipment Step pivot transfers: Max assist, +2 safety/equipment Transfer via Lift Equipment: Stedy General transfer comment: Totay only min x 2 to on each site to stand with use of stedy and patient pulling up on stedy bar - once to stand from bed then to stand for placement in chair.     Ambulation / Gait / Stairs / Wheelchair Mobility   Ambulation/Gait General Gait Details: deferred     Posture / Balance Balance Overall balance assessment: Needs assistance Sitting-balance support: No upper extremity supported, Feet supported Sitting balance-Leahy Scale: Fair Standing balance support: During functional activity, Reliant on assistive device for balance Standing balance-Leahy Scale: Poor Standing balance comment: unable     Special needs/care consideration Skin surgical incision, Special service needs colostomy, Continuous/IVPB: 0.9% sodium chloride infusion and Diabetic Management: novoLOG 0-5 units daily at bedtime; novoLOG 0-15 units 3x daily at meals    Previous Home Environment (from acute therapy documentation) Living Arrangements: Spouse/significant other  Lives With: Spouse Available Help at  Discharge: Family Type of Home: House Home Layout: Two level Alternate  Level Stairs-Rails: Left Alternate Level Stairs-Number of Steps: 15 down to his  office Home Access: Stairs to enter Entrance Stairs-Rails: Right, Left Entrance Stairs-Number of Steps: 5 Bathroom Shower/Tub: Multimedia programmer: Standard Bathroom Accessibility: Yes How Accessible: Accessible via wheelchair [place]: No   Discharge Living Setting Plans for Discharge Living Setting: Patient's home Type of Home at Discharge: House Discharge Home Layout: Two level, Able to live on main level with bedroom/bathroom Alternate Level Stairs-Rails: Left Alternate Level Stairs-Number of Steps: 15 down to office Discharge Home Access: Stairs to enter Entrance Stairs-Rails: Can reach both, Left, Right Entrance Stairs-Number of Steps: 5 Discharge Bathroom Shower/Tub: Tub/shower unit Discharge Bathroom Toilet: Standard Discharge Bathroom Accessibility: No Does the patient have any problems obtaining your medications?: No   Social/Family/Support Systems Patient Roles: Spouse Contact Information: [phone] Anticipated Caregiver: 24/7 Anticipated Caregiver's Contact Information: Laderius Valbuena Ability/Limitations of Caregiver: Min A Caregiver Availability: 24/7 Discharge Plan Discussed with Primary Caregiver: Yes Is Caregiver In Agreement with Plan?: Yes   Goals Patient/Family Goal for Rehab: PT/OT Supervision Expected length of stay: 10-12 days Pt/Family Agrees to Admission and willing to participate: Yes Program Orientation Provided & Reviewed with Pt/Caregiver Including Roles  & Responsibilities: No   Decrease burden of Care through IP rehab admission: n/a    Possible need for SNF placement upon discharge: not anticipated    Patient Condition: I have reviewed medical records from [facility] , spoken with CM, and patient and spouse. I discussed via phone for inpatient rehabilitation  assessment.  Patient will benefit from ongoing PT and OT, can actively participate in 3 hours of therapy a day 5 days of the week, and can make measurable gains during the admission.  Patient will also benefit from the coordinated team approach during an Inpatient Acute Rehabilitation admission.  The patient will receive intensive therapy as well as Rehabilitation physician, nursing, social worker, and care management interventions.  Due to safety, skin/wound care, disease management, medication administration, pain management, and patient education the patient requires 24 hour a day rehabilitation nursing.  The patient is currently Mod A with mobility and basic ADLs.  Discharge setting and therapy post discharge at home with home health is anticipated.  Patient has agreed to participate in the Acute Inpatient Rehabilitation Program and will admit today.   Preadmission Screen Completed By:  Genella Mech, with updates by Gayland Curry 02/26/2022 8:19 AM ______________________________________________________________________   Discussed status with Dr. Naaman Plummer on 03/01/22  at 10:07 AM and received approval for admission today.   Admission Coordinator:  Genella Mech, CCC-SLP, time 10:08 AM/Date 03/01/22     Assessment/Plan: Diagnosis: debility after peritonitis Does the need for close, 24 hr/day Medical supervision in concert with the patient's rehab needs make it unreasonable for this patient to be served in a less intensive setting? Yes Co-Morbidities requiring supervision/potential complications: ostomy, adhd, pain, nutrition considerations Due to bladder management, bowel management, safety, skin/wound care, disease management, medication administration, pain management, and patient education, does the patient require 24 hr/day rehab nursing? Yes Does the patient require coordinated care of a physician, rehab nurse, PT, OT to address physical and functional deficits in the context of the  above medical diagnosis(es)? Yes Addressing deficits in the following areas: balance, endurance, locomotion, strength, transferring, bowel/bladder control, bathing, dressing, feeding, grooming, toileting, and psychosocial support Can the patient actively participate in an intensive therapy program of at least 3 hrs of therapy 5 days a week? Yes The potential for  patient to make measurable gains while on inpatient rehab is excellent Anticipated functional outcomes upon discharge from inpatient rehab: supervision PT, supervision OT, n/a SLP Estimated rehab length of stay to reach the above functional goals is: 10-12 days Anticipated discharge destination: Home 10. Overall Rehab/Functional Prognosis: excellent     MD Signature: Meredith Staggers, MD, [place] Director Rehabilitation Services 03/01/2022

## 2022-03-01 NOTE — Progress Notes (Signed)
Progress Note  4 Days Post-Op  Subjective: Tolerating diet and having ostomy output. Possible plans for CIR today and GI potentially planning c-scope soon. Pt reports rash over trunk yesterday but wife feels like it appears improved this AM.   Objective: Vital signs in last 24 hours: Temp:  [96.1 F (35.6 C)-98.8 F (37.1 C)] 97.9 F (36.6 C) (12/15 0800) Pulse Rate:  [63-101] 101 (12/15 0600) Resp:  [19-29] 25 (12/15 0600) BP: (113-140)/(40-93) 140/93 (12/15 0600) SpO2:  [89 %-97 %] 94 % (12/15 0600) Last BM Date : 02/28/22  Intake/Output from previous day: 12/14 0701 - 12/15 0700 In: 1281.6 [P.O.:837; I.V.:285.1; IV Piggyback:149.5] Out: 1295 [Urine:900; Drains:95; GNFAO:130] Intake/Output this shift: No intake/output data recorded.  PE: General: pleasant, WD, obese male who is laying in bed in NAD Heart: regular, rate, and rhythm.   Lungs: Respiratory effort nonlabored Abd: soft, NT, mild distention, ostomy productive of stool, some ulceration on stoma but mostly viable and stoma is patent, midline wound clean with small amount fibrinous exudate centrally, drain in LLQ with lacteal drainage MS: edema of LUE Skin: truncal rash    Lab Results:  Recent Labs    02/28/22 0410 03/01/22 0447  WBC 5.4 7.3  HGB 8.4* 8.1*  HCT 26.6* 25.9*  PLT 223 262    BMET Recent Labs    02/28/22 0410 03/01/22 0447  NA 140 142  K 4.2 4.0  CL 101 103  CO2 30 30  GLUCOSE 275* 252*  BUN 11 16  CREATININE 0.37* 0.35*  CALCIUM 8.0* 8.0*    PT/INR No results for input(s): "LABPROT", "INR" in the last 72 hours.  CMP     Component Value Date/Time   NA 142 03/01/2022 0447   K 4.0 03/01/2022 0447   CL 103 03/01/2022 0447   CO2 30 03/01/2022 0447   GLUCOSE 252 (H) 03/01/2022 0447   BUN 16 03/01/2022 0447   CREATININE 0.35 (L) 03/01/2022 0447   CREATININE 0.63 (L) 03/10/2019 1436   CALCIUM 8.0 (L) 03/01/2022 0447   PROT 5.8 (L) 03/01/2022 0447   ALBUMIN 2.3 (L)  03/01/2022 0447   AST 15 03/01/2022 0447   ALT 10 03/01/2022 0447   ALKPHOS 97 03/01/2022 0447   BILITOT 0.3 03/01/2022 0447   GFRNONAA >60 03/01/2022 0447   Lipase  No results found for: "LIPASE"     Studies/Results: No results found.  Anti-infectives: Anti-infectives (From admission, onward)    Start     Dose/Rate Route Frequency Ordered Stop   02/27/22 1200  metroNIDAZOLE (FLAGYL) IVPB 500 mg  Status:  Discontinued        500 mg 100 mL/hr over 60 Minutes Intravenous Every 12 hours 02/27/22 1047 02/28/22 0915   02/25/22 2000  piperacillin-tazobactam (ZOSYN) IVPB 3.375 g        3.375 g 12.5 mL/hr over 240 Minutes Intravenous Every 8 hours 02/25/22 1928     02/15/22 1400  meropenem (MERREM) 1 g in sodium chloride 0.9 % 100 mL IVPB  Status:  Discontinued        1 g 200 mL/hr over 30 Minutes Intravenous Every 8 hours 02/15/22 1244 02/19/22 1709   02/15/22 1400  vancomycin (VANCOCIN) IVPB 1000 mg/200 mL premix  Status:  Discontinued        1,000 mg 200 mL/hr over 60 Minutes Intravenous Every 12 hours 02/15/22 1304 02/18/22 1445   02/08/22 1200  piperacillin-tazobactam (ZOSYN) IVPB 3.375 g  Status:  Discontinued  3.375 g 12.5 mL/hr over 240 Minutes Intravenous Every 8 hours 02/08/22 0505 02/15/22 1243   02/08/22 0500  piperacillin-tazobactam (ZOSYN) IVPB 3.375 g  Status:  Discontinued        3.375 g 12.5 mL/hr over 240 Minutes Intravenous Every 8 hours 02/08/22 0006 02/08/22 0505   02/08/22 0334  piperacillin-tazobactam (ZOSYN) 3.375 GM/50ML IVPB       Note to Pharmacy: Enis Gash W: cabinet override      02/08/22 0334 02/08/22 0345   02/07/22 2030  ceFEPIme (MAXIPIME) 2 g in sodium chloride 0.9 % 100 mL IVPB        2 g 200 mL/hr over 30 Minutes Intravenous  Once 02/07/22 2023 02/07/22 2248   02/07/22 2030  metroNIDAZOLE (FLAGYL) IVPB 500 mg        500 mg 100 mL/hr over 60 Minutes Intravenous  Once 02/07/22 2023 02/08/22 0506   02/07/22 2030  vancomycin  (VANCOCIN) IVPB 1000 mg/200 mL premix        1,000 mg 200 mL/hr over 60 Minutes Intravenous  Once 02/07/22 2023 02/08/22 0506        Assessment/Plan  Sigmoid perforation  POD22 S/P exploratory laparotomy, colon resection, end colostomy 02/07/22 Dr. [name] - having ostomy output, continue fiber and iron   - CT 12/1 with some free fluid but no organized collection, CT 12/5 with no major changes - BRBPR 12/11 - s/p flex-sig which showed bleeding ulcerations. CTA also showed LLQ abscess - hgb stable this AM, s/p 4 units PRBC 12/11 - s/p IR drain placement and paracentesis 12/12 - Cxs pending, ok to narrow abx when cultures back  - steroids for acute Crohn's initiated 12/13 - stable for CIR from a surgical standpoint    FEN: reg diet; cont iron and vit c VTE: LMWH on hold  ID: vanc/flagyl/cefepime 11/23; zosyn 11/24>11/29; vanc 12/1>12/4; merrem 12/1> 12/5; Zosyn 12/11>>   - below per TRH -  Post-operative respiratory insufficiency T2DM Physical deconditioning  Acute metabolic encephalopathy, improving  HTN HLD Obesity class I OSA Hypogonadism Moderate protein calorie malnutrition  ADHD/Anxiety    LOS: 21 days     Norm Parcel, [facility] 03/01/2022, 8:58 AM Please see Amion for pager number during day hours 7:00am-4:30pm

## 2022-03-01 NOTE — H&P (Signed)
Physical Medicine and Rehabilitation Admission H&P    Chief Complaint  Patient presents with   Functional deficits due to multiple medical issues/debility.     HPI:  Richard [name]. Richard [name] is a 66 year old male with history of T2DM, obesity, severe OSA, ADHD,  HTN, scrotal abscess 10/2021, recent diagnosis of Crohn's colitis after 3-4 weeks of diarrhea and started on prednisone 02/04/22. He was admitted on [facility] on 02/07/22 with abdominal pain, weakness, DOE, tachycardia and fever-T- 102 due to sepsis from perforated bowel  w/acute peritonitis.  CTA chest negative for PE. He was started on Vanc/Cefepime and  taken to OR emergently for Exp Lap with mobilization of splenic flexure, resection of descending and sigmoid colon and transverse colostomy by Dr. [name] the same day. NGT kept in place with high output, ongoing abdominal distension and low grade fevers. and ileus. Hospital course significant for metabolic alkalosis w/hypokalemia felt to be due to GI losses/hypovolemia and IVF/ KCL runs recommended by Dr. Jonnie Finner.  Pathology c/w IBD.   He also had issues with confusion w/ worsening of symptoms and antibiotics changed to Meropenum/Vanc on 12/01 due to concerns of PNA. CT abdomen/pelvis done and showed stable free fluid with increase in mildly dilated SB loops likely due to post op ileus, no abscess and right sided pleural effusion. He was started on TNA for nutritional support and continued to have fevers with tachycardia felt to be due to underlying IBD and GI consulted for input on starting steroids but expressed concerns due to pending infectious workup  but Reglan added to help manage nausea. NGT removed on 12/05 and he was started on liquid diet, weaned off TPN and diet advanced to regular by 12/14.  He has refused to use his CPAP since NGT removal. He has had issues with increase in peripheral edema treated with dose of lasix, high volume output via colostomy-->fibercon added as well as drop in Hgb  requiring transfusion and developed BRBPR on 12/11 requiring 4 units PRBC. Lovenox d/c. CTA abdomen revealed acute extravasation from rectum and 10 cm rim enhancing fluid collection above staple line in LLQ. He underwent colonoscopy by Dr. Everardo Pacific which revealed blood in rectum, rectal varices and multiple ulcers in mid and distal rectum felt to source of bleeding and deeply crated mid rectal ulcer tx with PuraStat hemostatic gel. Recommendations for no chemical VTE PPx for 48-72 hours. Patient also underwent CT guided paracentesis with LLQ drain placement in LLQ abscess by Dr. Dwaine Gale on 12/12.   LUE duplex done due to edema and technically difficult study but did not show evidence of DVT and trail of compression used? He was started on solumedrol 12/13 and GI plans for ostomy evaluation this weekend and imaging next week to follow post abscess drainage and eventual consideration of Remicade infusions.  He did develop a rash 12/14 felt to be due Zosyn which briefly discontinued and Flagyl added however abdominal culture 12/12 grew E coli and Bacteriodes Thetaitoamicron and recommendations by Dr. Juleen China were to transition to IV ceftriaxone and Flagyl while LLQ drain remains in place and then to decided on d/c of antibiotics v/s transition to oral antibiotics.  Rash reported to be resolving and intake improving. WOC has been working with patient/wife on ostomy care. Therapy has been working with patient who continues to be limited by weakness, dizziness with standing in [place] as well as fatigue. CIR recommended due to functional decline.    Review of Systems  Constitutional:  Negative for chills  and fever.  HENT:  Negative for hearing loss and tinnitus.   Eyes:  Negative for blurred vision and double vision.  Respiratory:  Negative for cough and shortness of breath.   Cardiovascular:  Positive for leg swelling.  Gastrointestinal:  Positive for diarrhea. Negative for heartburn.  Genitourinary:  Negative for  dysuria.  Musculoskeletal:  Positive for back pain.  Neurological:  Positive for dizziness (with activity) and weakness.      Past Medical History:  Diagnosis Date   Adult ADHD    Allergic rhinitis    Anxiety    claustrophobic   Bronchopneumonia 11/03/2013   Colon polyps 2012 and 2013   All hyperplastic except one adenomatous w/out high grade dysplasia (recall 2023 per Dr. Deatra Ina)   COVID-19 virus infection 09/29/2020   Elevated transaminase level 2017   Viral hep screening neg.  Suspect fatty liver.   Hyperlipidemia    Hypertension    Obesity    OSA on CPAP    Severe.  CPAP 10 cm H2O.  Follows Dr. Ander Slade.   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 [name] to do surg   Pneumonia    Seborrheic dermatitis of scalp    and face: hydrocort 2% cream bid prn to face, and fluocinonide 0.5% sol'n to scalp bid prn per Dr. Allyson Sabal (04/2014)   Secondary male hypogonadism 04/2021   Type 2 diabetes mellitus with microalbuminuria ([place]) DM dx-09/27/2015. Microalb 2020    Past Surgical History:  Procedure Laterality Date   COLONOSCOPY W/ POLYPECTOMY  04/2010; 10/2011   Initial screening TCS showed 6 polyps (one adenomatous).  Pt says he returned for repeat TCS 10/2011 showed one hyperplastic polyp: Recall 10 yrs per Dr. Samuella Cota SIGMOIDOSCOPY N/A 02/25/2022   Procedure: FLEXIBLE SIGMOIDOSCOPY;  Surgeon: Irving Copas., MD;  Location: [facility];  Service: Gastroenterology;  Laterality: N/A;   HEMOSTASIS CONTROL  02/25/2022   Procedure: HEMOSTASIS CONTROL;  Surgeon: Rush Landmark Telford Nab., MD;  Location: [facility];  Service: Gastroenterology;;   INCISION AND DRAINAGE ABSCESS N/A 11/07/2021   Procedure: Incision and drainage of scrotal abscess;  Surgeon: Leighton Ruff, MD;  Location: [facility];  Service: General;  Laterality: N/A;   LAPAROTOMY N/A 02/07/2022   Procedure: EXPLORATORY LAPAROTOMY, COLON RESECTION, OSTOMY;  Surgeon: Leighton Ruff, MD;  Location:  [facility];  Service: General;  Laterality: N/A;   MOUTH SURGERY      Family History  Problem Relation Age of Onset   Lung cancer Father    Cancer Father        nasal   Allergies Father    Colon cancer Neg Hx    Esophageal cancer Neg Hx    Rectal cancer Neg Hx    Stomach cancer Neg Hx     Social History:  reports that he quit smoking about 19 years ago. His smoking use included cigarettes. He smoked an average of 1 pack per day. He has never used smokeless tobacco. He reports current alcohol use of about 7.0 standard drinks of alcohol per week. He reports that he does not use drugs.   Allergies  Allergen Reactions   Lobster [Shellfish Allergy] Nausea Only   Poison Ivy Extract Itching    Medications Prior to Admission  Medication Sig Dispense Refill   acetaminophen (TYLENOL) 500 MG tablet Take 1,000 mg by mouth every 6 (six) hours as needed for mild pain.     atorvastatin (LIPITOR) 40 MG tablet TAKE 1 TABLET BY MOUTH EVERY DAY 90 tablet  1   clotrimazole-betamethasone (LOTRISONE) cream Apply 1 Application topically 2 (two) times daily. 30 g 1   diphenhydrAMINE (BENADRYL) 25 mg capsule Take 25 mg by mouth daily as needed for allergies.     diphenoxylate-atropine (LOMOTIL) 2.5-0.025 MG tablet Take 1-2 tablets by mouth 4 (four) times daily as needed for diarrhea or loose stools. 1-2 tabs po qid prn diarrhea 60 tablet 0   empagliflozin (JARDIANCE) 10 MG TABS tablet TAKE 1 TABLET BY MOUTH EVERY DAY BEFORE BREAKFAST (Patient taking differently: Take 10 mg by mouth daily.) 90 tablet 0   lisinopril (ZESTRIL) 10 MG tablet TAKE 1 TABLET DAILY 90 tablet 0   metFORMIN (GLUCOPHAGE) 500 MG tablet TAKE 1 TABLET BY MOUTH TWICE A DAY WITH FOOD 180 tablet 1   methylphenidate (METADATE CD) 20 MG CR capsule Take 1 capsule (20 mg total) by mouth daily as needed. (Patient taking differently: Take 20 mg by mouth daily as needed (for focuswhile at work).) 30 capsule 0   [EXPIRED] predniSONE (DELTASONE) 10 MG  tablet Take 4 tablets (40 mg total) by mouth daily with breakfast for 14 days. After 14 days, then slow taper by '5mg'$  every 7 days. 154 tablet 0   Accu-Chek Softclix Lancets lancets SMARTSIG:Topical     glucose blood (ACCU-CHEK GUIDE) test strip USE TO TEST BLOOD SUGAR TWICE DAILY 200 strip 0   SYRINGE-NEEDLE, DISP, 3 ML 22G X 1-1/2" 3 ML MISC USE TO ADMINISTER TESTOSTERONE INJECTION EVERY 2 WEEKS 50 each 1   Syringe/Needle, Disp, 18G X 1" 3 ML MISC USE TO ADMINISTER TESTOSTERONE INJECTION EVERY 2 WEEKS 50 each 1   testosterone cypionate (DEPOTESTOSTERONE CYPIONATE) 200 MG/ML injection 200 MG (1 ML) SQ Q 2 WEEKS (Patient not taking: Reported on 02/08/2022) 5 mL 1      Home: Home Living Family/patient expects to be discharged to:: Private residence Living Arrangements: Spouse/significant other Available Help at Discharge: Family Type of Home: House Home Access: Stairs to enter Technical brewer of Steps: 5 Entrance Stairs-Rails: Right, Left Home Layout: Two level Alternate Level Stairs-Number of Steps: 15 down to his  office Alternate Level Stairs-Rails: Left Bathroom Shower/Tub: Multimedia programmer: Programmer, systems: Yes Home Equipment: None  Lives With: Spouse   Functional History: Prior Function Prior Level of Function : Independent/Modified Independent 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 ,  Functional Status:  Mobility: Bed Mobility Overal bed mobility: Needs Assistance Bed Mobility: Supine to Sit Rolling: Min guard Sidelying to sit: Min guard, HOB elevated Sit to supine: +2 for physical assistance, +2 for safety/equipment, Max assist Sit to sidelying: Max assist General bed mobility comments: min guard, increased time, cuing, pt able to  push to sitting. max assist to place legs back onto bed Transfers Overall transfer level: Needs assistance Equipment used: Rolling walker (2 wheels) Transfers: Sit to/from Stand Sit to Stand: Mod assist,  From elevated surface, +2 physical assistance Bed to/from chair/wheelchair/BSC transfer type:: Step pivot Step pivot transfers: +2 safety/equipment, Mod assist, +2 physical assistance Transfer via Lift Equipment: [place] transfer comment: stood x 2 at stedy. stood  ` 1 minute x 2 reporting dizziness. BP 121/61(close to last supine BP). HR 100 with activity. patient limited with reports of fatigue also. Assisted back to bed, attempted  chair position, did not tolerate. Ambulation/Gait General Gait Details: deferred    ADL: ADL Overall ADL's : Needs assistance/impaired Eating/Feeding Details (indicate cue type and reason): can only have some ice chips at this time. Grooming: Set up, Sitting Upper  Body Bathing: Moderate assistance Upper Body Bathing Details (indicate cue type and reason): too many lines and leads to assess fully at this time Lower Body Bathing: Total assistance, Bed level Upper Body Dressing : Maximal assistance, Sitting Lower Body Dressing: Bed level, Total assistance Toilet Transfer: +2 for safety/equipment, +2 for physical assistance Toilet Transfer Details (indicate cue type and reason): patient attempted sitting EOB with noted cramps in BLE with repositioning on edge of bed. returned to supine with +2 Toileting- Clothing Manipulation and Hygiene: Total assistance, Bed level Functional mobility during ADLs: +2 for physical assistance, +2 for safety/equipment  Cognition: Cognition Overall Cognitive Status: Within Functional Limits for tasks assessed Orientation Level: Oriented X4 Cognition Arousal/Alertness: Awake/alert Behavior During Therapy: Flat affect Overall Cognitive Status: Within Functional Limits for tasks assessed General Comments: Slight improvement in affect,   Blood pressure (!) 140/73, pulse (!) 108, temperature 97.9 F (36.6 C), temperature source Oral, resp. rate (!) 23, height '5\' 9"'$  (1.753 m), weight 112.2 kg, SpO2 93 %. Physical  Exam Vitals and nursing note reviewed.  Constitutional:      Appearance: Normal appearance.  HENT:     Head: Normocephalic.     Right Ear: External ear normal.     Left Ear: External ear normal.     Nose: Nose normal.  Eyes:     Extraocular Movements: Extraocular movements intact.     Pupils: Pupils are equal, round, and reactive to light.  Cardiovascular:     Rate and Rhythm: Normal rate and regular rhythm.     Heart sounds: No murmur heard.    No gallop.  Pulmonary:     Effort: Pulmonary effort is normal. No respiratory distress.     Breath sounds: Normal breath sounds. No wheezing.  Abdominal:     General: There is no distension.     Tenderness: There is abdominal tenderness.     Comments: Midline incision with wet to dry dressing. Wound is superficial and granulating in nicely.  LLQ drain with whitish/purulent drainage. Colostomy with liquid stool, sealed.   Musculoskeletal:     Cervical back: Normal range of motion.     Right lower leg: Edema present.     Left lower leg: Edema present.     Comments: 2-3+ pedal edema and 2+ tibial edema LLE>RLE.   Skin:    General: Skin is warm.     Comments: See abdomen  Neurological:     Mental Status: He is alert.     Comments: Alert and oriented x 3. Normal insight and awareness. Intact Memory. Normal language and speech. Cranial nerve exam unremarkable. UE 5/5. LE 3+/5 prox to 4/5 distally. No focal sensory changes. DTR's 1+  Psychiatric:        Mood and Affect: Mood normal.        Behavior: Behavior normal.    Results for orders placed or performed during the hospital encounter of 02/07/22 (from the past 48 hour(s))  Glucose, capillary     Status: Abnormal   Collection Time: 02/27/22 12:00 PM  Result Value Ref Range   Glucose-Capillary 163 (H) 70 - 99 mg/dL    Comment: Glucose reference range applies only to samples taken after fasting for at least 8 hours.   Comment 1 Notify RN    Comment 2 Document in Chart   Glucose,  capillary     Status: Abnormal   Collection Time: 02/27/22  5:09 PM  Result Value Ref Range   Glucose-Capillary 182 (H) 70 - 99  mg/dL    Comment: Glucose reference range applies only to samples taken after fasting for at least 8 hours.   Comment 1 Notify RN    Comment 2 Document in Chart   Hemoglobin and hematocrit, blood     Status: Abnormal   Collection Time: 02/27/22  5:30 PM  Result Value Ref Range   Hemoglobin 8.2 (L) 13.0 - 17.0 g/dL   HCT 26.0 (L) 39.0 - 52.0 %    Comment: Performed at [facility], [place] [address] [place], [place] [address]  Glucose, capillary     Status: Abnormal   Collection Time: 02/27/22  9:48 PM  Result Value Ref Range   Glucose-Capillary 224 (H) 70 - 99 mg/dL    Comment: Glucose reference range applies only to samples taken after fasting for at least 8 hours.  CBC with Differential/Platelet     Status: Abnormal   Collection Time: 02/28/22  4:10 AM  Result Value Ref Range   WBC 5.4 4.0 - 10.5 K/uL   RBC 2.72 (L) 4.22 - 5.81 MIL/uL   Hemoglobin 8.4 (L) 13.0 - 17.0 g/dL   HCT 26.6 (L) 39.0 - 52.0 %   MCV 97.8 80.0 - 100.0 fL   MCH 30.9 26.0 - 34.0 pg   MCHC 31.6 30.0 - 36.0 g/dL   RDW 15.6 (H) 11.5 - 15.5 %   Platelets 223 150 - 400 K/uL   nRBC 0.0 0.0 - 0.2 %   Neutrophils Relative % 88 %   Neutro Abs 4.8 1.7 - 7.7 K/uL   Lymphocytes Relative 9 %   Lymphs Abs 0.5 (L) 0.7 - 4.0 K/uL   Monocytes Relative 2 %   Monocytes Absolute 0.1 0.1 - 1.0 K/uL   Eosinophils Relative 0 %   Eosinophils Absolute 0.0 0.0 - 0.5 K/uL   Basophils Relative 0 %   Basophils Absolute 0.0 0.0 - 0.1 K/uL   Immature Granulocytes 1 %   Abs Immature Granulocytes 0.06 0.00 - 0.07 K/uL    Comment: Performed at [facility], [place] [address] [place], [place] [address]  Magnesium     Status: None   Collection Time: 02/28/22  4:10 AM  Result Value Ref Range   Magnesium 1.8 1.7 - 2.4 mg/dL    Comment: Performed at [facility], [place] [address] [place], [place] [address]  Phosphorus     Status: None   Collection Time: 02/28/22  4:10 AM  Result Value Ref Range   Phosphorus 2.7 2.5 - 4.6 mg/dL    Comment: Performed at [facility] [place] [address] [place], [place] [address]  Comprehensive metabolic panel     Status: Abnormal   Collection Time: 02/28/22  4:10 AM  Result Value Ref Range   Sodium 140 135 - 145 mmol/L   Potassium 4.2 3.5 - 5.1 mmol/L   Chloride 101 98 - 111 mmol/L   CO2 30 22 - 32 mmol/L   Glucose, Bld 275 (H) 70 - 99 mg/dL    Comment: Glucose reference range applies only to samples taken after fasting for at least 8 hours.   BUN 11 8 - 23 mg/dL   Creatinine, Ser 0.37 (L) 0.61 - 1.24 mg/dL   Calcium 8.0 (L) 8.9 - 10.3 mg/dL   Total Protein 6.2 (L) 6.5 - 8.1 g/dL   Albumin 2.7 (L) 3.5 - 5.0 g/dL   AST 16 15 - 41 U/L   ALT 10 0 - 44 U/L  Alkaline Phosphatase 98 38 - 126 U/L   Total Bilirubin 0.6 0.3 - 1.2 mg/dL   GFR, Estimated >60 >60 mL/min    Comment: (NOTE) Calculated using the CKD-EPI Creatinine Equation (2021)    Anion gap 9 5 - 15    Comment: Performed at [facility], [place] [address], [place], [place] [address]  Hemoglobin A1c     Status: Abnormal   Collection Time: 02/28/22  4:10 AM  Result Value Ref Range   Hgb A1c MFr Bld 6.1 (H) 4.8 - 5.6 %    Comment: (NOTE)         Prediabetes: 5.7 - 6.4         Diabetes: >6.4         Glycemic control for adults with diabetes: <7.0    Mean Plasma Glucose 128 mg/dL    Comment: (NOTE) Performed At: [facility] [place], [place] [id number] Rush Farmer MD [id number]   Glucose, capillary     Status: Abnormal   Collection Time: 02/28/22  8:32 AM  Result Value Ref Range   Glucose-Capillary 244 (H) 70 - 99 mg/dL    Comment: Glucose reference range applies only to samples taken after fasting for at least 8 hours.   Comment 1 Notify RN    Comment 2 Document in Chart    Glucose, capillary     Status: Abnormal   Collection Time: 02/28/22 12:08 PM  Result Value Ref Range   Glucose-Capillary 223 (H) 70 - 99 mg/dL    Comment: Glucose reference range applies only to samples taken after fasting for at least 8 hours.   Comment 1 Notify RN    Comment 2 Document in Chart   Glucose, capillary     Status: Abnormal   Collection Time: 02/28/22 12:08 PM  Result Value Ref Range   Glucose-Capillary 223 (H) 70 - 99 mg/dL    Comment: Glucose reference range applies only to samples taken after fasting for at least 8 hours.   Comment 1 Notify RN    Comment 2 Document in Chart   Glucose, capillary     Status: Abnormal   Collection Time: 02/28/22  5:05 PM  Result Value Ref Range   Glucose-Capillary 312 (H) 70 - 99 mg/dL    Comment: Glucose reference range applies only to samples taken after fasting for at least 8 hours.   Comment 1 Notify RN    Comment 2 Document in Chart   Glucose, capillary     Status: Abnormal   Collection Time: 02/28/22  9:08 PM  Result Value Ref Range   Glucose-Capillary 317 (H) 70 - 99 mg/dL    Comment: Glucose reference range applies only to samples taken after fasting for at least 8 hours.  CBC with Differential/Platelet     Status: Abnormal   Collection Time: 03/01/22  4:47 AM  Result Value Ref Range   WBC 7.3 4.0 - 10.5 K/uL   RBC 2.60 (L) 4.22 - 5.81 MIL/uL   Hemoglobin 8.1 (L) 13.0 - 17.0 g/dL   HCT 25.9 (L) 39.0 - 52.0 %   MCV 99.6 80.0 - 100.0 fL   MCH 31.2 26.0 - 34.0 pg   MCHC 31.3 30.0 - 36.0 g/dL   RDW 15.3 11.5 - 15.5 %   Platelets 262 150 - 400 K/uL   nRBC 0.0 0.0 - 0.2 %   Neutrophils Relative % 79 %   Neutro Abs 5.7 1.7 - 7.7 K/uL   Lymphocytes  Relative 11 %   Lymphs Abs 0.8 0.7 - 4.0 K/uL   Monocytes Relative 7 %   Monocytes Absolute 0.5 0.1 - 1.0 K/uL   Eosinophils Relative 0 %   Eosinophils Absolute 0.0 0.0 - 0.5 K/uL   Basophils Relative 0 %   Basophils Absolute 0.0 0.0 - 0.1 K/uL   Immature Granulocytes 3 %    Abs Immature Granulocytes 0.21 (H) 0.00 - 0.07 K/uL    Comment: Performed at [facility], [place] [address], [place], [place] [address]  Magnesium     Status: None   Collection Time: 03/01/22  4:47 AM  Result Value Ref Range   Magnesium 1.8 1.7 - 2.4 mg/dL    Comment: Performed at [facility], [place] [address] [place], [place] [address]  Phosphorus     Status: Abnormal   Collection Time: 03/01/22  4:47 AM  Result Value Ref Range   Phosphorus 2.3 (L) 2.5 - 4.6 mg/dL    Comment: Performed at [facility], [place] [address] [place], [place] [address]  Comprehensive metabolic panel     Status: Abnormal   Collection Time: 03/01/22  4:47 AM  Result Value Ref Range   Sodium 142 135 - 145 mmol/L   Potassium 4.0 3.5 - 5.1 mmol/L   Chloride 103 98 - 111 mmol/L   CO2 30 22 - 32 mmol/L   Glucose, Bld 252 (H) 70 - 99 mg/dL    Comment: Glucose reference range applies only to samples taken after fasting for at least 8 hours.   BUN 16 8 - 23 mg/dL   Creatinine, Ser 0.35 (L) 0.61 - 1.24 mg/dL   Calcium 8.0 (L) 8.9 - 10.3 mg/dL   Total Protein 5.8 (L) 6.5 - 8.1 g/dL   Albumin 2.3 (L) 3.5 - 5.0 g/dL   AST 15 15 - 41 U/L   ALT 10 0 - 44 U/L   Alkaline Phosphatase 97 38 - 126 U/L   Total Bilirubin 0.3 0.3 - 1.2 mg/dL   GFR, Estimated >60 >60 mL/min    Comment: (NOTE) Calculated using the CKD-EPI Creatinine Equation (2021)    Anion gap 9 5 - 15    Comment: Performed at [facility], [place] [address], [place], [place] [address]  Glucose, capillary     Status: Abnormal   Collection Time: 03/01/22  8:08 AM  Result Value Ref Range   Glucose-Capillary 224 (H) 70 - 99 mg/dL    Comment: Glucose reference range applies only to samples taken after fasting for at least 8 hours.   Comment 1 Notify RN    Comment 2 Document in Chart    No results found.    Blood pressure (!) 140/73, pulse (!) 108, temperature 97.9 F (36.6 C),  temperature source Oral, resp. rate (!) 23, height '5\' 9"'$  (1.753 m), weight 112.2 kg, SpO2 93 %.  Medical Problem List and Plan: 1. Functional deficits secondary to debility after acute peritonitis and multiple sequelae   -patient may not yet shower  -ELOS/Goals: 10-12 days, supervision 2.  Antithrombotics: -DVT/anticoagulation:  Mechanical: Sequential compression devices, below knee Bilateral lower extremities as high risk for rebleeding.   -antiplatelet therapy: N/A 3. Pain Management: Oxycodone prn.  4. Mood/Behavior/Sleep: LCSW to follow for evaluation and support.  --trazodone prn for insomnia.   -antipsychotic agents: N/a 5. Neuropsych/cognition: This patient is capable of making decisions on his own behalf. 6. Skin/Wound Care: Pressure relief measures.   --wet to  dry dressing changed to once a day (per patient request). -wound healing well. May be able to change to a transitional dressing soon.   7. Fluids/Electrolytes/Nutrition: Monitor I/O. Encourage intake.   --supplements with meals--change to ensure Max as BS trending up w/steroids on board  --Add beneprotein/juven to promote healing.  8. Colon perforation s/p resection/post op abscess: Transitioned to Ceftriaxone 12/15 and on Flagyl per Dr. Juleen China --continue IV antibiotics with drain in place.  --contact ID for input after drain removed.  9. Hypomagnesemia: Has required frequent IV supplementation. Recheck in am.  --Oral supplement held due to diarrhea? 10. Crohn's disease: Started on Solumedrol on 12/13 with plans for colonoscopy this weekend potentially.   --Reglan bid for abdominal distension. Fibercon for high output stool/diarrhea 11. Tachycardia: Due to deconditioning. On low dose metoprolol due to orthostatic symptoms.   --monitor for symptoms with increase in activity.   --check orthostatic vitals.  12. ABLA: Recheck CBC on 12/18 13. T2DM: Hgb A1C- 6.6-->6.1. Was on metformin and Jardiance at home.   --Monitor BS  ac/hs and use SSI for elevated BS. Hold Jardiance due to infection.   --Start Semglee 15 units while on steroids per Diabetes Coordinator recs? 14. Severe OSA: Encourage CPAP use.   15. Fluid overload: Treated with IV lasix 12/10-12/13.  --Monitor I/O and daily weights.        Bary Leriche, PA-C 03/01/2022

## 2022-03-01 NOTE — Progress Notes (Signed)
Patient refuses CPAP 

## 2022-03-01 NOTE — Progress Notes (Addendum)
Inpatient Rehabilitation Admission Medication Review by a Pharmacist  A complete drug regimen review was completed for this patient to identify any potential clinically significant medication issues.  High Risk Drug Classes Is patient taking? Indication by Medication  Antipsychotic Yes Compazine -  prn N/V  Anticoagulant No   Antibiotic Yes, as an intravenous medication IV Rocephin/Flagyl- abdominal infection  Opioid No   Antiplatelet No   Hypoglycemics/insulin Yes SSI - DM  Vasoactive Medication Yes Metoprolol - HTN  Chemotherapy No   Other Yes Trazodone - prn sleep Zofran prn - refractory N/V Atorvastatin - HLD Hydroxyzine prn itching Solu-medrol - Peritonitis  Metoclopramide, Pantoprazole - Gerd  Thiamine - Supplement     Type of Medication Issue Identified Description of Issue Recommendation(s)  Drug Interaction(s) (clinically significant)     Duplicate Therapy     Allergy     No Medication Administration End Date     Incorrect Dose     Additional Drug Therapy Needed     Significant med changes from prior encounter (inform family/care partners about these prior to discharge).    Other       Clinically significant medication issues were identified that warrant physician communication and completion of prescribed/recommended actions by midnight of the next day:  No  Pharmacist comments: PO diabetes regimen on hold - metformin/Jardiance Blood pressure medications held- Lisinopril  Time spent performing this drug regimen review (minutes):  30 minutes  Thank you Anette Guarneri, PharmD

## 2022-03-01 NOTE — TOC Transition Note (Addendum)
Transition of Care [facility]) - CM/SW Discharge Note   Patient Details  Name: HUBER MATHERS MRN: [id number] Date of Birth: [birth year]  Transition of Care [facility]) CM/SW Contact:  Roseanne Kaufman, RN Phone Number: 03/01/2022, 12:17 PM   Clinical Narrative:   Ander Purpura with CIR advised CIR bed available today, plan for 12p pick up today. No additional TOC needs at this time.  - 12:35p Spoke with Jarrett Soho [phone] with patient's insurance, who was inquiring if insurance auth was received. This RNCM advised the CIR received auth and plan to transfer to CIR today.  No additional TOC needs.   Final next level of care: IP Rehab Facility Barriers to Discharge: No Barriers Identified   Patient Goals and CMS Choice Patient states their goals for this hospitalization and ongoing recovery are:: home   Choice offered to / list presented to : Patient, Spouse  Discharge Placement                       Discharge Plan and Services In-house Referral: NA Discharge Planning Services: CM Consult            DME Arranged: N/A DME Agency: NA       HH Arranged: NA HH Agency: NA Date HH Agency Contacted: 02/13/22 Time [place]: [id number] Representative spoke with at [place]: [place] Interventions     Readmission Risk Interventions     No data to display

## 2022-03-01 NOTE — Inpatient Diabetes Management (Signed)
Inpatient Diabetes Program Recommendations  AACE/ADA: New Consensus Statement on Inpatient Glycemic Control (2015)  Target Ranges:  Prepandial:   less than 140 mg/dL      Peak postprandial:   less than 180 mg/dL (1-2 hours)      Critically ill patients:  140 - 180 mg/dL    Latest Reference Range & Units 01/23/22 13:58 02/28/22 04:10  Hemoglobin A1C 4.8 - 5.6 % 6.6 ! 6.1 (H)  !: Data is abnormal (H): Data is abnormally high  Latest Reference Range & Units 02/28/22 08:32 02/28/22 12:08 02/28/22 17:05 02/28/22 21:08  Glucose-Capillary 70 - 99 mg/dL 244 (H)  5 units Novolog  223 (H)  5 units Novolog  312 (H)  11 units Novolog  317 (H)  4 units Novolog   (H): Data is abnormally high  Latest Reference Range & Units 03/01/22 08:08  Glucose-Capillary 70 - 99 mg/dL 224 (H)  (H): Data is abnormally high    Home DM Meds: Jardiance 10 mg daily        Metformin 500 mg BID   Current Orders: Novolog Moderate Correction Scale/ SSI (0-15 units) TID AC + HS   MD- Note pt Started Solumedrol 40 mg Daily PM of 12/13  CBGs now >200  Please consider starting Semglee 15 units Daily (0.15 units/kg) while pt remains on IV Steroids    --Will follow patient during hospitalization--  Wyn Quaker RN, MSN, [place] Diabetes Coordinator Inpatient Glycemic Control Team Team Pager: [phone] (8a-5p)

## 2022-03-01 NOTE — Progress Notes (Signed)
Inpatient Rehab Admissions Coordinator:  There is a bed available for pt in CIR today. Dr. Cyd Silence is aware and in agreement. Pt, pt's wife [name], NSG, and TOC made aware.   Gayland Curry, [place], [place] Admissions Coordinator [phone]

## 2022-03-01 NOTE — H&P (Signed)
Physical Medicine and Rehabilitation Admission H&P        Chief Complaint  Patient presents with   Functional deficits due to multiple medical issues/debility.       HPI:  Richard [name] is a 66 year old male with history of T2DM, obesity, severe OSA, ADHD,  HTN, scrotal abscess 10/2021, recent diagnosis of Crohn's colitis after 3-4 weeks of diarrhea and started on prednisone 02/04/22. He was admitted on [facility] on 02/07/22 with abdominal pain, weakness, DOE, tachycardia and fever-T- 102 due to sepsis from perforated bowel  w/acute peritonitis.  CTA chest negative for PE. He was started on Vanc/Cefepime and  taken to OR emergently for Exp Lap with mobilization of splenic flexure, resection of descending and sigmoid colon and transverse colostomy by Dr. [name] the same day. NGT kept in place with high output, ongoing abdominal distension and low grade fevers. and ileus. Hospital course significant for metabolic alkalosis w/hypokalemia felt to be due to GI losses/hypovolemia and IVF/ KCL runs recommended by Dr. Jonnie Finner.  Pathology c/w IBD.    He also had issues with confusion w/ worsening of symptoms and antibiotics changed to Meropenum/Vanc on 12/01 due to concerns of PNA. CT abdomen/pelvis done and showed stable free fluid with increase in mildly dilated SB loops likely due to post op ileus, no abscess and right sided pleural effusion. He was started on TNA for nutritional support and continued to have fevers with tachycardia felt to be due to underlying IBD and GI consulted for input on starting steroids but expressed concerns due to pending infectious workup  but Reglan added to help manage nausea. NGT removed on 12/05 and he was started on liquid diet, weaned off TPN and diet advanced to regular by 12/14.   He has refused to use his CPAP since NGT removal. He has had issues with increase in peripheral edema treated with dose of lasix, high volume output via colostomy-->fibercon added as well as drop in  Hgb requiring transfusion and developed BRBPR on 12/11 requiring 4 units PRBC. Lovenox d/c. CTA abdomen revealed acute extravasation from rectum and 10 cm rim enhancing fluid collection above staple line in LLQ. He underwent colonoscopy by Dr. Everardo Pacific which revealed blood in rectum, rectal varices and multiple ulcers in mid and distal rectum felt to source of bleeding and deeply crated mid rectal ulcer tx with PuraStat hemostatic gel. Recommendations for no chemical VTE PPx for 48-72 hours. Patient also underwent CT guided paracentesis with LLQ drain placement in LLQ abscess by Dr. Dwaine Gale on 12/12.    LUE duplex done due to edema and technically difficult study but did not show evidence of DVT and trail of compression used? He was started on solumedrol 12/13 and GI plans for ostomy evaluation this weekend and imaging next week to follow post abscess drainage and eventual consideration of Remicade infusions.  He did develop a rash 12/14 felt to be due Zosyn which briefly discontinued and Flagyl added however abdominal culture 12/12 grew E coli and Bacteriodes Thetaitoamicron and recommendations by Dr. Juleen China were to transition to IV ceftriaxone and Flagyl while LLQ drain remains in place and then to decided on d/c of antibiotics v/s transition to oral antibiotics.  Rash reported to be resolving and intake improving. WOC has been working with patient/wife on ostomy care. Therapy has been working with patient who continues to be limited by weakness, dizziness with standing in [place] as well as fatigue. CIR recommended due to functional decline.  Review of Systems  Constitutional:  Negative for chills and fever.  HENT:  Negative for hearing loss and tinnitus.   Eyes:  Negative for blurred vision and double vision.  Respiratory:  Negative for cough and shortness of breath.   Cardiovascular:  Positive for leg swelling.  Gastrointestinal:  Positive for diarrhea. Negative for heartburn.  Genitourinary:   Negative for dysuria.  Musculoskeletal:  Positive for back pain.  Neurological:  Positive for dizziness (with activity) and weakness.              Past Medical History:  Diagnosis Date   Adult ADHD     Allergic rhinitis     Anxiety      claustrophobic   Bronchopneumonia 11/03/2013   Colon polyps 2012 and 2013    All hyperplastic except one adenomatous w/out high grade dysplasia (recall 2023 per Dr. Deatra Ina)   COVID-19 virus infection 09/29/2020   Elevated transaminase level 2017    Viral hep screening neg.  Suspect fatty liver.   Hyperlipidemia     Hypertension     Obesity     OSA on CPAP      Severe.  CPAP 10 cm H2O.  Follows Dr. Ander Slade.   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 [name] to do surg   Pneumonia     Seborrheic dermatitis of scalp      and face: hydrocort 2% cream bid prn to face, and fluocinonide 0.5% sol'n to scalp bid prn per Dr. Allyson Sabal (04/2014)   Secondary male hypogonadism 04/2021   Type 2 diabetes mellitus with microalbuminuria ([place]) DM dx-09/27/2015. Microalb 2020           Past Surgical History:  Procedure Laterality Date   COLONOSCOPY W/ POLYPECTOMY   04/2010; 10/2011    Initial screening TCS showed 6 polyps (one adenomatous).  Pt says he returned for repeat TCS 10/2011 showed one hyperplastic polyp: Recall 10 yrs per Dr. Samuella Cota SIGMOIDOSCOPY N/A 02/25/2022    Procedure: FLEXIBLE SIGMOIDOSCOPY;  Surgeon: Irving Copas., MD;  Location: [facility];  Service: Gastroenterology;  Laterality: N/A;   HEMOSTASIS CONTROL   02/25/2022    Procedure: HEMOSTASIS CONTROL;  Surgeon: Rush Landmark Telford Nab., MD;  Location: [facility];  Service: Gastroenterology;;   INCISION AND DRAINAGE ABSCESS N/A 11/07/2021    Procedure: Incision and drainage of scrotal abscess;  Surgeon: Leighton Ruff, MD;  Location: [facility];  Service: General;  Laterality: N/A;   LAPAROTOMY N/A 02/07/2022    Procedure: EXPLORATORY LAPAROTOMY, COLON  RESECTION, OSTOMY;  Surgeon: Leighton Ruff, MD;  Location: [facility];  Service: General;  Laterality: N/A;   MOUTH SURGERY               Family History  Problem Relation Age of Onset   Lung cancer Father     Cancer Father          nasal   Allergies Father     Colon cancer Neg Hx     Esophageal cancer Neg Hx     Rectal cancer Neg Hx     Stomach cancer Neg Hx        Social History:  reports that he quit smoking about 19 years ago. His smoking use included cigarettes. He smoked an average of 1 pack per day. He has never used smokeless tobacco. He reports current alcohol use of about 7.0 standard drinks of alcohol per week. He reports that he does not use drugs.  Allergies  Allergen Reactions   Lobster [Shellfish Allergy] Nausea Only   Poison Ivy Extract Itching            Medications Prior to Admission  Medication Sig Dispense Refill   acetaminophen (TYLENOL) 500 MG tablet Take 1,000 mg by mouth every 6 (six) hours as needed for mild pain.       atorvastatin (LIPITOR) 40 MG tablet TAKE 1 TABLET BY MOUTH EVERY DAY 90 tablet 1   clotrimazole-betamethasone (LOTRISONE) cream Apply 1 Application topically 2 (two) times daily. 30 g 1   diphenhydrAMINE (BENADRYL) 25 mg capsule Take 25 mg by mouth daily as needed for allergies.       diphenoxylate-atropine (LOMOTIL) 2.5-0.025 MG tablet Take 1-2 tablets by mouth 4 (four) times daily as needed for diarrhea or loose stools. 1-2 tabs po qid prn diarrhea 60 tablet 0   empagliflozin (JARDIANCE) 10 MG TABS tablet TAKE 1 TABLET BY MOUTH EVERY DAY BEFORE BREAKFAST (Patient taking differently: Take 10 mg by mouth daily.) 90 tablet 0   lisinopril (ZESTRIL) 10 MG tablet TAKE 1 TABLET DAILY 90 tablet 0   metFORMIN (GLUCOPHAGE) 500 MG tablet TAKE 1 TABLET BY MOUTH TWICE A DAY WITH FOOD 180 tablet 1   methylphenidate (METADATE CD) 20 MG CR capsule Take 1 capsule (20 mg total) by mouth daily as needed. (Patient taking differently: Take 20 mg by mouth  daily as needed (for focuswhile at work).) 30 capsule 0   [EXPIRED] predniSONE (DELTASONE) 10 MG tablet Take 4 tablets (40 mg total) by mouth daily with breakfast for 14 days. After 14 days, then slow taper by '5mg'$  every 7 days. 154 tablet 0   Accu-Chek Softclix Lancets lancets SMARTSIG:Topical       glucose blood (ACCU-CHEK GUIDE) test strip USE TO TEST BLOOD SUGAR TWICE DAILY 200 strip 0   SYRINGE-NEEDLE, DISP, 3 ML 22G X 1-1/2" 3 ML MISC USE TO ADMINISTER TESTOSTERONE INJECTION EVERY 2 WEEKS 50 each 1   Syringe/Needle, Disp, 18G X 1" 3 ML MISC USE TO ADMINISTER TESTOSTERONE INJECTION EVERY 2 WEEKS 50 each 1   testosterone cypionate (DEPOTESTOSTERONE CYPIONATE) 200 MG/ML injection 200 MG (1 ML) SQ Q 2 WEEKS (Patient not taking: Reported on 02/08/2022) 5 mL 1          Home: Home Living Family/patient expects to be discharged to:: Private residence Living Arrangements: Spouse/significant other Available Help at Discharge: Family Type of Home: House Home Access: Stairs to enter Technical brewer of Steps: 5 Entrance Stairs-Rails: Right, Left Home Layout: Two level Alternate Level Stairs-Number of Steps: 15 down to his  office Alternate Level Stairs-Rails: Left Bathroom Shower/Tub: Multimedia programmer: Programmer, systems: Yes Home Equipment: None  Lives With: Spouse   Functional History: Prior Function Prior Level of Function : Independent/Modified Independent 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 ,   Functional Status:  Mobility: Bed Mobility Overal bed mobility: Needs Assistance Bed Mobility: Supine to Sit Rolling: Min guard Sidelying to sit: Min guard, HOB elevated Sit to supine: +2 for physical assistance, +2 for safety/equipment, Max assist Sit to sidelying: Max assist General bed mobility comments: min guard, increased time, cuing, pt able to  push to sitting. max assist to place legs back onto bed Transfers Overall transfer level: Needs  assistance Equipment used: Rolling walker (2 wheels) Transfers: Sit to/from Stand Sit to Stand: Mod assist, From elevated surface, +2 physical assistance Bed to/from chair/wheelchair/BSC transfer type:: Step pivot Step pivot transfers: +2 safety/equipment, Mod assist, +2 physical  assistance Transfer via Lift Equipment: [employer] transfer comment: stood x 2 at [employer]. stood  ` 1 minute x 2 reporting dizziness. BP 121/61(close to last supine BP). HR 100 with activity. patient limited with reports of fatigue also. Assisted back to bed, attempted  chair position, did not tolerate. Ambulation/Gait General Gait Details: deferred   ADL: ADL Overall ADL's : Needs assistance/impaired Eating/Feeding Details (indicate cue type and reason): can only have some ice chips at this time. Grooming: Set up, Sitting Upper Body Bathing: Moderate assistance Upper Body Bathing Details (indicate cue type and reason): too many lines and leads to assess fully at this time Lower Body Bathing: Total assistance, Bed level Upper Body Dressing : Maximal assistance, Sitting Lower Body Dressing: Bed level, Total assistance Toilet Transfer: +2 for safety/equipment, +2 for physical assistance Toilet Transfer Details (indicate cue type and reason): patient attempted sitting EOB with noted cramps in BLE with repositioning on edge of bed. returned to supine with +2 Toileting- Clothing Manipulation and Hygiene: Total assistance, Bed level Functional mobility during ADLs: +2 for physical assistance, +2 for safety/equipment   Cognition: Cognition Overall Cognitive Status: Within Functional Limits for tasks assessed Orientation Level: Oriented X4 Cognition Arousal/Alertness: Awake/alert Behavior During Therapy: Flat affect Overall Cognitive Status: Within Functional Limits for tasks assessed General Comments: Slight improvement in affect,     Blood pressure (!) 140/73, pulse (!) 108, temperature 97.9 F (36.6 C),  temperature source Oral, resp. rate (!) 23, height '5\' 9"'$  (1.753 m), weight 112.2 kg, SpO2 93 %. Physical Exam Vitals and nursing note reviewed.  Constitutional:      Appearance: Normal appearance.  HENT:     Head: Normocephalic.     Right Ear: External ear normal.     Left Ear: External ear normal.     Nose: Nose normal.  Eyes:     Extraocular Movements: Extraocular movements intact.     Pupils: Pupils are equal, round, and reactive to light.  Cardiovascular:     Rate and Rhythm: Normal rate and regular rhythm.     Heart sounds: No murmur heard.    No gallop.  Pulmonary:     Effort: Pulmonary effort is normal. No respiratory distress.     Breath sounds: Normal breath sounds. No wheezing.  Abdominal:     General: There is no distension.     Tenderness: There is abdominal tenderness.     Comments: Midline incision with wet to dry dressing. Wound is superficial and granulating in nicely.  LLQ drain with whitish/purulent drainage. Colostomy with liquid stool, sealed.   Musculoskeletal:     Cervical back: Normal range of motion.     Right lower leg: Edema present.     Left lower leg: Edema present.     Comments: 2-3+ pedal edema and 2+ tibial edema LLE>RLE.   Skin:    General: Skin is warm.     Comments: See abdomen  Neurological:     Mental Status: He is alert.     Comments: Alert and oriented x 3. Normal insight and awareness. Intact Memory. Normal language and speech. Cranial nerve exam unremarkable. UE 5/5. LE 3+/5 prox to 4/5 distally. No focal sensory changes. DTR's 1+  Psychiatric:        Mood and Affect: Mood normal.        Behavior: Behavior normal.      Lab Results Last 48 Hours        Results for orders placed or performed during the hospital  encounter of 02/07/22 (from the past 48 hour(s))  Glucose, capillary     Status: Abnormal    Collection Time: 02/27/22 12:00 PM  Result Value Ref Range    Glucose-Capillary 163 (H) 70 - 99 mg/dL      Comment: Glucose  reference range applies only to samples taken after fasting for at least 8 hours.    Comment 1 Notify RN      Comment 2 Document in Chart    Glucose, capillary     Status: Abnormal    Collection Time: 02/27/22  5:09 PM  Result Value Ref Range    Glucose-Capillary 182 (H) 70 - 99 mg/dL      Comment: Glucose reference range applies only to samples taken after fasting for at least 8 hours.    Comment 1 Notify RN      Comment 2 Document in Chart    Hemoglobin and hematocrit, blood     Status: Abnormal    Collection Time: 02/27/22  5:30 PM  Result Value Ref Range    Hemoglobin 8.2 (L) 13.0 - 17.0 g/dL    HCT 26.0 (L) 39.0 - 52.0 %      Comment: Performed at [facility], [place] [address] [place], [place] [address]  Glucose, capillary     Status: Abnormal    Collection Time: 02/27/22  9:48 PM  Result Value Ref Range    Glucose-Capillary 224 (H) 70 - 99 mg/dL      Comment: Glucose reference range applies only to samples taken after fasting for at least 8 hours.  CBC with Differential/Platelet     Status: Abnormal    Collection Time: 02/28/22  4:10 AM  Result Value Ref Range    WBC 5.4 4.0 - 10.5 K/uL    RBC 2.72 (L) 4.22 - 5.81 MIL/uL    Hemoglobin 8.4 (L) 13.0 - 17.0 g/dL    HCT 26.6 (L) 39.0 - 52.0 %    MCV 97.8 80.0 - 100.0 fL    MCH 30.9 26.0 - 34.0 pg    MCHC 31.6 30.0 - 36.0 g/dL    RDW 15.6 (H) 11.5 - 15.5 %    Platelets 223 150 - 400 K/uL    nRBC 0.0 0.0 - 0.2 %    Neutrophils Relative % 88 %    Neutro Abs 4.8 1.7 - 7.7 K/uL    Lymphocytes Relative 9 %    Lymphs Abs 0.5 (L) 0.7 - 4.0 K/uL    Monocytes Relative 2 %    Monocytes Absolute 0.1 0.1 - 1.0 K/uL    Eosinophils Relative 0 %    Eosinophils Absolute 0.0 0.0 - 0.5 K/uL    Basophils Relative 0 %    Basophils Absolute 0.0 0.0 - 0.1 K/uL    Immature Granulocytes 1 %    Abs Immature Granulocytes 0.06 0.00 - 0.07 K/uL      Comment: Performed at [facility], [place] [address],  [place], [place] [address]  Magnesium     Status: None    Collection Time: 02/28/22  4:10 AM  Result Value Ref Range    Magnesium 1.8 1.7 - 2.4 mg/dL      Comment: Performed at [facility], [place] [address], [place], [place] [address]  Phosphorus     Status: None    Collection Time: 02/28/22  4:10 AM  Result Value Ref Range    Phosphorus 2.7 2.5 - 4.6 mg/dL  Comment: Performed at [facility], [place] [address], [place], [place] [address]  Comprehensive metabolic panel     Status: Abnormal    Collection Time: 02/28/22  4:10 AM  Result Value Ref Range    Sodium 140 135 - 145 mmol/L    Potassium 4.2 3.5 - 5.1 mmol/L    Chloride 101 98 - 111 mmol/L    CO2 30 22 - 32 mmol/L    Glucose, Bld 275 (H) 70 - 99 mg/dL      Comment: Glucose reference range applies only to samples taken after fasting for at least 8 hours.    BUN 11 8 - 23 mg/dL    Creatinine, Ser 0.37 (L) 0.61 - 1.24 mg/dL    Calcium 8.0 (L) 8.9 - 10.3 mg/dL    Total Protein 6.2 (L) 6.5 - 8.1 g/dL    Albumin 2.7 (L) 3.5 - 5.0 g/dL    AST 16 15 - 41 U/L    ALT 10 0 - 44 U/L    Alkaline Phosphatase 98 38 - 126 U/L    Total Bilirubin 0.6 0.3 - 1.2 mg/dL    GFR, Estimated >60 >60 mL/min      Comment: (NOTE) Calculated using the CKD-EPI Creatinine Equation (2021)      Anion gap 9 5 - 15      Comment: Performed at [facility], [place] [address] [place], [place] [address]  Hemoglobin A1c     Status: Abnormal    Collection Time: 02/28/22  4:10 AM  Result Value Ref Range    Hgb A1c MFr Bld 6.1 (H) 4.8 - 5.6 %      Comment: (NOTE)         Prediabetes: 5.7 - 6.4         Diabetes: >6.4         Glycemic control for adults with diabetes: <7.0      Mean Plasma Glucose 128 mg/dL      Comment: (NOTE) Performed At: [facility] [place], [place] [id number] Rush Farmer MD [id number]    Glucose, capillary     Status: Abnormal    Collection Time: 02/28/22   8:32 AM  Result Value Ref Range    Glucose-Capillary 244 (H) 70 - 99 mg/dL      Comment: Glucose reference range applies only to samples taken after fasting for at least 8 hours.    Comment 1 Notify RN      Comment 2 Document in Chart    Glucose, capillary     Status: Abnormal    Collection Time: 02/28/22 12:08 PM  Result Value Ref Range    Glucose-Capillary 223 (H) 70 - 99 mg/dL      Comment: Glucose reference range applies only to samples taken after fasting for at least 8 hours.    Comment 1 Notify RN      Comment 2 Document in Chart    Glucose, capillary     Status: Abnormal    Collection Time: 02/28/22 12:08 PM  Result Value Ref Range    Glucose-Capillary 223 (H) 70 - 99 mg/dL      Comment: Glucose reference range applies only to samples taken after fasting for at least 8 hours.    Comment 1 Notify RN      Comment 2 Document in Chart    Glucose, capillary     Status: Abnormal    Collection Time: 02/28/22  5:05 PM  Result Value  Ref Range    Glucose-Capillary 312 (H) 70 - 99 mg/dL      Comment: Glucose reference range applies only to samples taken after fasting for at least 8 hours.    Comment 1 Notify RN      Comment 2 Document in Chart    Glucose, capillary     Status: Abnormal    Collection Time: 02/28/22  9:08 PM  Result Value Ref Range    Glucose-Capillary 317 (H) 70 - 99 mg/dL      Comment: Glucose reference range applies only to samples taken after fasting for at least 8 hours.  CBC with Differential/Platelet     Status: Abnormal    Collection Time: 03/01/22  4:47 AM  Result Value Ref Range    WBC 7.3 4.0 - 10.5 K/uL    RBC 2.60 (L) 4.22 - 5.81 MIL/uL    Hemoglobin 8.1 (L) 13.0 - 17.0 g/dL    HCT 25.9 (L) 39.0 - 52.0 %    MCV 99.6 80.0 - 100.0 fL    MCH 31.2 26.0 - 34.0 pg    MCHC 31.3 30.0 - 36.0 g/dL    RDW 15.3 11.5 - 15.5 %    Platelets 262 150 - 400 K/uL    nRBC 0.0 0.0 - 0.2 %    Neutrophils Relative % 79 %    Neutro Abs 5.7 1.7 - 7.7 K/uL    Lymphocytes  Relative 11 %    Lymphs Abs 0.8 0.7 - 4.0 K/uL    Monocytes Relative 7 %    Monocytes Absolute 0.5 0.1 - 1.0 K/uL    Eosinophils Relative 0 %    Eosinophils Absolute 0.0 0.0 - 0.5 K/uL    Basophils Relative 0 %    Basophils Absolute 0.0 0.0 - 0.1 K/uL    Immature Granulocytes 3 %    Abs Immature Granulocytes 0.21 (H) 0.00 - 0.07 K/uL      Comment: Performed at [facility], [place] [address] [place], [place] [address]  Magnesium     Status: None    Collection Time: 03/01/22  4:47 AM  Result Value Ref Range    Magnesium 1.8 1.7 - 2.4 mg/dL      Comment: Performed at [facility], [place] [address], [place], [place] [address]  Phosphorus     Status: Abnormal    Collection Time: 03/01/22  4:47 AM  Result Value Ref Range    Phosphorus 2.3 (L) 2.5 - 4.6 mg/dL      Comment: Performed at [facility], [place] [address], [place], [place] [address]  Comprehensive metabolic panel     Status: Abnormal    Collection Time: 03/01/22  4:47 AM  Result Value Ref Range    Sodium 142 135 - 145 mmol/L    Potassium 4.0 3.5 - 5.1 mmol/L    Chloride 103 98 - 111 mmol/L    CO2 30 22 - 32 mmol/L    Glucose, Bld 252 (H) 70 - 99 mg/dL      Comment: Glucose reference range applies only to samples taken after fasting for at least 8 hours.    BUN 16 8 - 23 mg/dL    Creatinine, Ser 0.35 (L) 0.61 - 1.24 mg/dL    Calcium 8.0 (L) 8.9 - 10.3 mg/dL    Total Protein 5.8 (L) 6.5 - 8.1 g/dL    Albumin 2.3 (L) 3.5 - 5.0 g/dL    AST 15 15 - 41  U/L    ALT 10 0 - 44 U/L    Alkaline Phosphatase 97 38 - 126 U/L    Total Bilirubin 0.3 0.3 - 1.2 mg/dL    GFR, Estimated >60 >60 mL/min      Comment: (NOTE) Calculated using the CKD-EPI Creatinine Equation (2021)      Anion gap 9 5 - 15      Comment: Performed at [facility], [place] [address] [place], [place] [address]  Glucose, capillary     Status: Abnormal    Collection Time: 03/01/22  8:08 AM   Result Value Ref Range    Glucose-Capillary 224 (H) 70 - 99 mg/dL      Comment: Glucose reference range applies only to samples taken after fasting for at least 8 hours.    Comment 1 Notify RN      Comment 2 Document in Chart        Imaging Results (Last 48 hours)  No results found.         Blood pressure (!) 140/73, pulse (!) 108, temperature 97.9 F (36.6 C), temperature source Oral, resp. rate (!) 23, height '5\' 9"'$  (1.753 m), weight 112.2 kg, SpO2 93 %.   Medical Problem List and Plan: 1. Functional deficits secondary to debility after acute peritonitis and multiple sequelae              -patient may not yet shower             -ELOS/Goals: 10-12 days, supervision 2.  Antithrombotics: -DVT/anticoagulation:  Mechanical: Sequential compression devices, below knee Bilateral lower extremities as high risk for rebleeding.              -antiplatelet therapy: N/A 3. Pain Management: Oxycodone prn.  4. Mood/Behavior/Sleep: LCSW to follow for evaluation and support.             --trazodone prn for insomnia.              -antipsychotic agents: N/a 5. Neuropsych/cognition: This patient is capable of making decisions on his own behalf. 6. Skin/Wound Care: Pressure relief measures.              --wet to dry dressing changed to once a day (per patient request). -wound healing well. May be able to change to a transitional dressing soon.   7. Fluids/Electrolytes/Nutrition: Monitor I/O. Encourage intake.              --supplements with meals--change to ensure Max as BS trending up w/steroids on board             --Add beneprotein/juven to promote healing.  8. Colon perforation s/p resection/post op abscess: Transitioned to Ceftriaxone 12/15 and on Flagyl per Dr. Juleen China --continue IV antibiotics with drain in place.  --contact ID for input after drain removed.  9. Hypomagnesemia: Has required frequent IV supplementation. Recheck in am.  --Oral supplement held due to diarrhea? 10. Crohn's  disease: Started on Solumedrol on 12/13 with plans for colonoscopy this weekend potentially.              --Reglan bid for abdominal distension. Fibercon for high output stool/diarrhea 11. Tachycardia: Due to deconditioning. On low dose metoprolol due to orthostatic symptoms.              --monitor for symptoms with increase in activity.              --check orthostatic vitals.  12. ABLA: Recheck CBC on 12/18 13. T2DM: Hgb  A1C- 6.6-->6.1. Was on metformin and Jardiance at home.              --Monitor BS ac/hs and use SSI for elevated BS. Hold Jardiance due to infection.              --Start Semglee 15 units while on steroids per Diabetes Coordinator recs? 14. Severe OSA: Encourage CPAP use.   15. Fluid overload: Treated with IV lasix 12/10-12/13.  --Monitor I/O and daily weights.            Bary [place], PA-C 03/01/2022  I have personally performed a face to face diagnostic evaluation of this patient and formulated the key components of the plan.  Additionally, I have personally reviewed laboratory data, imaging studies, as well as relevant notes and concur with the physician assistant's documentation above.  The patient's status has not changed from the original H&P.  Any changes in documentation from the acute care chart have been noted above.  Meredith Staggers, MD, [facility]

## 2022-03-01 NOTE — Progress Notes (Signed)
Progress Note  Primary GI: Dr. [name]   Subjective  Chief Complaint:IBD with acute bowel perforation  Patient lying in bed, drinking coffee.  Denies nausea or vomiting. Denies abdominal pain. States no fevers or chills last night.    Objective   Vital signs in last 24 hours: Temp:  [96.1 F (35.6 C)-98.9 F (37.2 C)] 97.5 F (36.4 C) (12/15 0332) Pulse Rate:  [63-101] 101 (12/15 0600) Resp:  [19-29] 25 (12/15 0600) BP: (113-140)/(40-93) 140/93 (12/15 0600) SpO2:  [89 %-97 %] 94 % (12/15 0600) Last BM Date : 02/28/22 Last BM recorded by nurses in past 5 days Stool Type: Type 6 (Mushy consistency with ragged edges) (02/28/2022  8:00 AM)  General: Obese male in no acute distress  Heart:  Regular rate and rhythm; no murmurs Pulm: Clear anteriorly; no wheezing Abdomen:  Soft, Obese AB, Active bowel sounds. No tenderness . Without guarding and Without rebound, left upper quadrant colostomy with green/brown soft stool, pink ostomy with mild ulceration.  Left abdomen JP bulb white/clear fluid Extremities:  with  edema bilateral legs with stasis dermatitis.  Along chest, abdomen and bilateral arms macular erythematous rash diffuse. Neurologic:  Alert and  oriented x4;  No focal deficits.  Psych:  Cooperative. Normal mood and affect.  Intake/Output from previous day: 12/14 0701 - 12/15 0700 In: 1281.6 [P.O.:837; I.V.:285.1; IV Piggyback:149.5] Out: 1295 [Urine:900; Drains:95; CWCBJ:628] Intake/Output this shift: No intake/output data recorded.  Studies/Results: No results found.  Lab Results: Recent Labs    02/27/22 0225 02/27/22 0812 02/27/22 1730 02/28/22 0410 03/01/22 0447  WBC 5.3  --   --  5.4 7.3  HGB 7.8*   < > 8.2* 8.4* 8.1*  HCT 24.6*   < > 26.0* 26.6* 25.9*  PLT 203  --   --  223 262   < > = values in this interval not displayed.   BMET Recent Labs    02/27/22 0225 02/28/22 0410 03/01/22 0447  NA 140 140 142  K 3.5 4.2 4.0  CL 101 101 103   CO2 33* 30 30  GLUCOSE 187* 275* 252*  BUN '11 11 16  '$ CREATININE 0.42* 0.37* 0.35*  CALCIUM 8.0* 8.0* 8.0*   LFT Recent Labs    03/01/22 0447  PROT 5.8*  ALBUMIN 2.3*  AST 15  ALT 10  ALKPHOS 97  BILITOT 0.3   PT/INR Recent Labs    02/26/22 0816  LABPROT 16.3*  INR 1.3*     Scheduled Meds:  ascorbic acid  500 mg Oral BID   atorvastatin  40 mg Oral Daily   Chlorhexidine Gluconate Cloth  6 each Topical Daily   feeding supplement  237 mL Oral TID BM   ferrous sulfate  325 mg Oral BID WC   insulin aspart  0-15 Units Subcutaneous TID WC   insulin aspart  0-5 Units Subcutaneous QHS   lip balm   Topical BID   methylPREDNISolone (SOLU-MEDROL) injection  40 mg Intravenous Daily   metoCLOPramide  5 mg Oral BID WC   metoprolol tartrate  6.25 mg Oral BID   multivitamin with minerals  1 tablet Oral Daily   pantoprazole  40 mg Oral Daily   polycarbophil  625 mg Oral BID   sodium chloride flush  10-40 mL Intracatheter Q12H   sodium chloride flush  5 mL Intracatheter Q8H   thiamine  100 mg Oral Daily   Continuous Infusions:  sodium chloride 20 mL/hr at 03/01/22 0430   chlorproMAZINE (THORAZINE)  12.5 mg in sodium chloride 0.9 % 25 mL IVPB Stopped (02/14/22 0913)   piperacillin-tazobactam (ZOSYN)  IV Stopped (03/01/22 0348)      Patient profile:   66 year old male recently diagnosed IBD favoring Crohn's admitted for progressive abdominal pain post colonoscopy (11/20 with severe/diffuse ulcerations and colitis entire colon)  found a perforation requiring urgent intervention with partial colectomy and transverse colostomy on 02/07/2022. Onset GI bleeding 02/25/2022 with flex showed multiple deep rectal ulceration status post Hemospray, rectal varices noted. IR drain placement left lower quadrant abscess 12/12 culture pending showed moderate gram-negative rods FEW BACTEROIDES THETAIOTAOMICRON  BETA LACTAMASE NEGATIVE, susceptibilities to pending  status post IR paracentesis  negative 48 hour culture.    Impression/Plan:   New diagnosis severe IBD likely Crohn's  status post perforation with emergency laparotomy with sigmoid resection and transverse colostomy on 11/23 On Zosyn, added IV Flagyl 12/13 with new diffuse macular papular pruritic rash discontinued 12/14  Negative peritoneal fluid for culture methylprednisolone 40 mg IV daily 12/13 Plan for ostomy evaluation to evaluate the right colon to help guide biologic intiation (Remicade), timing per Dr. Aurther Loft with endoscopy Patient is up-to-date on flu vaccine, COVID-vaccine, pneumococcal 23, Shingrix, patient asking about RSV vaccine, with active Crohn's and possible initiation of biologic therapy I do think is a good idea for patient to get RSV vaccine when available.  Acute blood loss  status post PRBCs 1 unit 12/9 S/p 3 units 12/11 and 1 units 12/12 for bleeding 02/25/2022  CTA venous source of bleeding in rectum branch of SMV rectal varices noted, no surgical options flex sig showing deep rectal ulceration status post Hemospray rectal varices No further evidence of bleeding. currently on oral iron twice daily Hemoglobin stable,  8.4 to 8.1  Left lower quadrant abscess status post IR drainage on 12/12 Gram stain positive for gram-negative rods Pending susceptibilities On Zosyn x 12/11  Free peritoneal fluid/ascites status post paracentesis 12/12 Likely representing fluid post perforation peritonitis No organisms, no growth at 48 hours  New maculopapular rash Flagyl discontinued 12/14 On methylprednisone 40 mg IV since 12/13  hydrocortisone cream as needed  Atarax 25 mg as needed for itching  Protein calorie malnutrition severe Secondary to above Count calories, RD consulted  Principal Problem:   Acute generalized peritonitis ([place]) Active Problems:   Perforated sigmoid colon ([place])   Protein-calorie malnutrition, severe   Ileus, postoperative ([place])   Crohn's disease of  colon with complication ([place])   Type 2 diabetes mellitus with hyperglycemia, without long-term current use of insulin ([facility])   Anxiety   Hypertension   Colon polyps   Colostomy in place [facility])   Acute blood loss anemia   Rectal bleeding   Abnormal CT scan, gastrointestinal tract   Sepsis ([place])   Left lower quadrant abdominal abscess ([place])   Rectal ulceration   Acute lower GI bleeding   Crohn's disease of colon with abscess ([place])   Inflammatory bowel disease   Acute metabolic encephalopathy   Perforated abdominal viscus   Intra-abdominal abscess ([place])   Abnormal colonoscopy   Erythematous rash    LOS: 21 days   Richard [name]  03/01/2022, 8:11 AM

## 2022-03-01 NOTE — Discharge Summary (Addendum)
Physician Discharge Summary   Patient: Richard [name] MRN: [id number] DOB: [birth year]  Admit date:     02/07/2022  Discharge date: 03/01/22  Discharge Physician: Vernelle Emerald   PCP: [name], MD   Recommendations at discharge:   Patient to consume a regular diet Patient to participate in physical therapy per the recommendations.  Please be aware the patient requires maximum assistance by at least 2 people to ambulate at this time Patient has an anterior abdominal wall wound.  Please change the dressing daily.  Apply wet to dry gauze followed by application of an ABD pad.  Secure with tape. Patient is full code Patient is receiving Accu-Cheks before every meal and nightly with the sliding scale insulin regimen as noted in the [facility] Patient has an ostomy.  Please care for ostomy per protocol Gastroenterology and general surgery are planning to follow along in consultation throughout the patient's care at [facility] Patient has a PICC, please care for PICC per protocol  Discharge Diagnoses: Principal Problem:   Acute generalized peritonitis ([place]) Active Problems:   Acute lower GI bleeding   Left lower quadrant abdominal abscess ([place])   Sepsis ([place])   Perforated sigmoid colon ([place])   Acute metabolic encephalopathy   Inflammatory bowel disease   Type 2 diabetes mellitus with hyperglycemia, without long-term current use of insulin ([facility])   Protein-calorie malnutrition, severe   Erythematous rash   Ileus, postoperative ([place])   Anxiety   Hypertension   Colostomy in place [facility])   Acute blood loss anemia   Rectal ulceration   Crohn's disease of colon with abscess ([place])   Intra-abdominal abscess ([place])   Resolved Problems:   * No resolved hospital problems. *   Hospital Course: Richard [name] is a 66 y.o. male with PMH significant for DM2, HTN, HLD, obesity, OSA, hypogonadism, peripheral neuropathy.  11/7, seen in the office by Dr. [name] with complaint of diarrhea for  about 3 to 4 months, 6-8 episodes a day, mixed with blood associated with abdominal cramping and progressive weakness.   11/20, underwent colonoscopy, found to have diffuse colitis with deep ulcerations throughout the entire colon friable mucosa as well as extensive mucosal edema in the rectum.  He was started on prednisone 40 mg daily.  After colonoscopy, patient developed worsening abdominal pain. 11/23, at home patient had 1 episode of syncope.  Also had a fever.  Abdominal pain worsened and hence came to the ED.   In the ED, he had fever of 102.1 with tachycardia. CT abdomen pelvis showed perforated bowel with moderate free fluid and free air. Started on empiric IV antibiotics, IV fluid, and was admitted to ICU.  General surgery was consulted.    11/24, patient underwent exploratory laparotomy, descending and sigmoid colon resection, transverse colostomy by Dr. [name]. Surgical pathology showed transmural inflammation consistent with IBD.  His postop course was complicated by almost 2 weeks of postop ileus which gradually resolved.   He received TPN, which was discontinued on 02/23/2022.  Patient has been tolerating oral intake since.  On 12/11 patient suddenly began to exhibit large volume lower gastrointestinal bleeding.  4 units of packed red blood cells had to be transfused.  During the urgent workup of this bleeding CT angiogram of the abdomen was performed revealing active extravasation of a branch of the SMV consistent with venous hemorrhage.  CT imaging of the abdomen pelvis did incidentally identify a 10 cm fluid filled enhancing collection consistent with early abscess in the  left lower quadrant however.    Patient underwent an urgent sigmoidoscopy on 12/12 with Dr. Rush Landmark who identified at least eight 63 to 50 mm ulcerations found throughout the mid rectum and distal rectum.  Hemostatic gel was applied to the most deep lesion in the mid rectum.  Anticoagulation was held going forward.     Concerning the patient's incidentally found 10 cm abscess in the left lower quadrant, patient was initially placed on intravenous Zosyn for antibiotic coverage.  On 12/12, Dr. Dwaine Gale with interventional radiology successfully inserted a percutaneous JP drain resulting in immediate expression of large amounts of purulent drainage.  In the days that followed patient exhibited no further episodes of bleeding.  In the multiple SIRS criteria the patient was exhibiting quickly resolved.  On 12/13, systemic steroids with intravenous Solu-Medrol were initiated which the patient tolerated well.[name] Kitchen  Hospital course was additionally complicated by the development of a diffuse erythematous rash on 12/14.  Since patient was recently initiated on Flagyl on 12/13 there was initial concern by gastroenterology that this may be the culprit.  This was discontinued at that time however rash continued to persist on 12/15.  Rash continues to be mild.  After further discussion and review of antibiotic regimen with Dr. Juleen China of infectious disease it is felt that the most likely culprit is actually the Zosyn.  Zosyn has since been discontinued.  Because of the growth of E. coli and Bacteroides Thetaiotoaomicron on the patient's 12/12 abdominal abscess culture decision was made to transition the patient to intravenous ceftriaxone and Flagyl for now.  Per Dr. Alcario Drought recommendation, patient should remain on intravenous ceftriaxone and Flagyl until interventional radiology decides to remove the patient's abscess drain.  At that point a decision can be made whether to stop antibiotics completely or transition the patient to a short course of oral antibiotics.  Patient has gradually clinically improved in the past several days and arrangements are being made for the patient to be discharged to [facility] for rehabilitation over the next several weeks.  While there, patient will continue to be followed by general surgery and gastroenterology.   Gastroenterology recommends the patient continue to receive intravenous Solu-Medrol before transitioning over to oral steroids.  In the next few days they would likely pursue colonoscopy via the patient's ostomy to help guide initiation of biologic therapy.  Interventional radiology should also continue to follow and discontinue the JP drain in the left lower quadrant when they deem appropriate.  Patient is currently being discharged in improved and stable condition.     Consultants: Dr. Rush Landmark with GI.  Dr. Jonnie Finner with Nephrology, Dr. Dwaine Gale with Interventional radiology, Dr. Reece Leader with general surgery Procedures performed:   Flexible sigmoidoscopy performed by Dr. Rush Landmark on 12/12 Left lower quadrant percutaneous drain placement by Dr. Dwaine Gale on 12/12 Exploratory laparotomy with descending and sigmoid colon resection and transverse colostomy on 11/24 by Dr. [name]  Disposition: Rehabilitation facility Diet recommendation:  Discharge Diet Orders (From admission, onward)     Start     Ordered   03/01/22 0000  Diet - low sodium heart healthy        03/01/22 1230           Regular diet  DISCHARGE MEDICATION: Allergies as of 03/01/2022       Reactions   Lobster [shellfish Allergy] Nausea Only   Poison Ivy Extract Itching        Medication List     STOP taking these medications    predniSONE  10 MG tablet Commonly known as: DELTASONE       TAKE these medications    Accu-Chek Guide test strip Generic drug: glucose blood USE TO TEST BLOOD SUGAR TWICE DAILY   Accu-Chek Softclix Lancets lancets SMARTSIG:Topical   acetaminophen 500 MG tablet Commonly known as: TYLENOL Take 1,000 mg by mouth every 6 (six) hours as needed for mild pain. What changed: Another medication with the same name was added. Make sure you understand how and when to take each.   acetaminophen 325 MG tablet Commonly known as: TYLENOL Take 2 tablets (650 mg total) by mouth every 4  (four) hours as needed for mild pain, moderate pain, fever or headache. What changed: You were already taking a medication with the same name, and this prescription was added. Make sure you understand how and when to take each.   alum & mag hydroxide-simeth 200-200-20 MG/5ML suspension Commonly known as: MAALOX/MYLANTA Take 30 mLs by mouth every 6 (six) hours as needed for indigestion or heartburn (or bloating).   ascorbic acid 500 MG tablet Commonly known as: VITAMIN C Take 1 tablet (500 mg total) by mouth 2 (two) times daily.   atorvastatin 40 MG tablet Commonly known as: LIPITOR TAKE 1 TABLET BY MOUTH EVERY DAY   cefTRIAXone 1 g injection Commonly known as: ROCEPHIN Inject 2 g into the muscle daily.   clotrimazole-betamethasone cream Commonly known as: LOTRISONE Apply 1 Application topically 2 (two) times daily.   diphenhydrAMINE 25 mg capsule Commonly known as: BENADRYL Take 25 mg by mouth daily as needed for allergies.   diphenoxylate-atropine 2.5-0.025 MG tablet Commonly known as: Lomotil Take 1-2 tablets by mouth 4 (four) times daily as needed for diarrhea or loose stools. 1-2 tabs po qid prn diarrhea   feeding supplement Liqd Take 237 mLs by mouth 3 (three) times daily between meals.   ferrous sulfate 325 (65 FE) MG tablet Take 1 tablet (325 mg total) by mouth 2 (two) times daily before a meal.   hydrOXYzine 25 MG tablet Commonly known as: ATARAX Take 1 tablet (25 mg total) by mouth 3 (three) times daily as needed for itching.   insulin aspart 100 UNIT/ML injection Commonly known as: novoLOG Inject 0-15 Units into the skin 3 (three) times daily with meals.   insulin aspart 100 UNIT/ML injection Commonly known as: novoLOG Inject 0-5 Units into the skin at bedtime.   Jardiance 10 MG Tabs tablet Generic drug: empagliflozin TAKE 1 TABLET BY MOUTH EVERY DAY BEFORE BREAKFAST What changed: See the new instructions.   lisinopril 10 MG tablet Commonly known as:  ZESTRIL TAKE 1 TABLET DAILY   metFORMIN 500 MG tablet Commonly known as: GLUCOPHAGE TAKE 1 TABLET BY MOUTH TWICE A DAY WITH FOOD   methylphenidate 20 MG CR capsule Commonly known as: METADATE CD Take 1 capsule (20 mg total) by mouth daily as needed. What changed: reasons to take this   methylPREDNISolone sodium succinate 40 mg/mL injection Commonly known as: SOLU-MEDROL Inject 1 mL (40 mg total) into the vein daily. Start taking on: March 02, 2022   metoCLOPramide 5 MG tablet Commonly known as: REGLAN Take 1 tablet (5 mg total) by mouth 2 (two) times daily with breakfast and lunch. Start taking on: March 02, 2022   metoprolol tartrate 25 MG tablet Commonly known as: LOPRESSOR Take 1 tablet (25 mg total) by mouth 2 (two) times daily.   metroNIDAZOLE 500 MG/100ML Commonly known as: FLAGYL Inject 100 mLs (500 mg total) into the vein every  12 (twelve) hours.   multivitamin with minerals Tabs tablet Take 1 tablet by mouth daily. Start taking on: March 02, 2022   ondansetron 4 MG/2ML Soln injection Commonly known as: ZOFRAN Inject 2 mLs (4 mg total) into the vein every 6 (six) hours as needed for nausea or vomiting.   pantoprazole 40 MG tablet Commonly known as: PROTONIX Take 1 tablet (40 mg total) by mouth daily. Start taking on: March 02, 2022   polycarbophil 625 MG tablet Commonly known as: FIBERCON Take 1 tablet (625 mg total) by mouth 2 (two) times daily.   Syringe/Needle (Disp) 18G X 1" 3 ML Misc USE TO ADMINISTER TESTOSTERONE INJECTION EVERY 2 WEEKS   SYRINGE-NEEDLE (DISP) 3 ML 22G X 1-1/2" 3 ML Misc USE TO ADMINISTER TESTOSTERONE INJECTION EVERY 2 WEEKS   testosterone cypionate 200 MG/ML injection Commonly known as: DEPOTESTOSTERONE CYPIONATE 200 MG (1 ML) SQ Q 2 WEEKS   thiamine 100 MG tablet Commonly known as: Vitamin B-1 Take 1 tablet (100 mg total) by mouth daily. Start taking on: March 02, 2022               Discharge Care  Instructions  (From admission, onward)           Start     Ordered   03/01/22 0000  Discharge wound care:       Comments: For anterior abdominal wall wound:  Change daily.  Wet to dry gauze followed by application of an ABD. Secure with tape.   03/01/22 [place]. Follow up.   Contact information: [place] [address] [phone]         Leighton Ruff, MD. Go on 04/24/3783.   Specialties: General Surgery, Colon and Rectal Surgery Why: follow up on 03/25/21 at 9:30 am. Please arrive 30 minutes early to complete check in, and bring photo ID and insurance card. Contact information: [address] [place] [address] [phone]                 Discharge Exam: Danley Danker Weights   02/26/22 1724 02/27/22 0447 02/28/22 0411  Weight: 115.9 kg 114.3 kg 112.2 kg   Constitutional: Awake alert and oriented x3, no associated distress.   Respiratory: Mild rales, no evidence of wheezing.  Normal respiratory effort. No accessory muscle use.  Cardiovascular: Regular rate and rhythm, no murmurs / rubs / gallops.  Bilateral lower extremity pitting edema, 2+ pedal pulses. No carotid bruits.  Abdomen: Very mild generalized tenderness.  Notable anterior abdominal wall incision with dressing clean dry and intact.  Abdomen is soft.  No evidence of intra-abdominal masses.  Positive bowel sounds noted in all quadrants.   Musculoskeletal: No joint deformity upper and lower extremities. Good ROM, no contractures. Normal muscle tone.     Condition at discharge: fair  The results of significant diagnostics from this hospitalization (including imaging, microbiology, ancillary and laboratory) are listed below for reference.   Imaging Studies: CT US GUIDED BIOPSY  Result Date: 02/26/2022 INDICATION: 66 year old gentleman with left lower quadrant abscess and right abdominal ascites  presents to IR for paracentesis. EXAM: ULTRASOUND GUIDED  PARACENTESIS MEDICATIONS: None. COMPLICATIONS: None immediate. PROCEDURE: Informed written consent was obtained from the patient after a discussion of the risks, benefits and alternatives to treatment. A timeout was performed prior to the initiation of the procedure. Initial  ultrasound scanning demonstrates a large amount of ascites within the right lower abdominal quadrant. The right lower abdomen was prepped and draped in the usual sterile fashion. 1% lidocaine was used for local anesthesia. Following this, a 19 gauge, 7-cm, Yueh catheter was introduced. An ultrasound image was saved for documentation purposes. The paracentesis was performed. The catheter was removed and a dressing was applied. The patient tolerated the procedure well without immediate post procedural complication. FINDINGS: A total of approximately 750 mL of straw-colored fluid was removed. IMPRESSION: Ultrasound-guided paracentesis yielding 750 mL of clear straw-colored peritoneal fluid. Samples were sent for Gram stain and culture. Electronically Signed   By: Miachel Roux M.D.   On: 02/26/2022 13:55   CT GUIDED PERITONEAL/RETROPERITONEAL FLUID DRAIN BY PERC CATH  Result Date: 02/26/2022 INDICATION: 66 year old gentleman with history of inflammatory bowel disease recently underwent exploratory laparotomy with descending and sigmoid colectomy. Patient has had intermittent fevers and recent CT shows left lower quadrant rim enhancing collection suspicious for abscess. EXAM: CT-guided left lower quadrant abdominal abscess drain placement TECHNIQUE: Multidetector CT imaging of the abdomen was performed following the standard protocol without IV contrast. RADIATION DOSE REDUCTION: This exam was performed according to the departmental dose-optimization program which includes automated exposure control, adjustment of the mA and/or kV according to patient size and/or use of iterative  reconstruction technique. MEDICATIONS: The patient is currently admitted to the hospital and receiving intravenous antibiotics. The antibiotics were administered within an appropriate time frame prior to the initiation of the procedure. ANESTHESIA/SEDATION: Moderate (conscious) sedation was employed during this procedure. A total of Versed 1 mg and Fentanyl 75 mcg was administered intravenously by the radiology nurse. Total intra-service moderate Sedation Time: 37 minutes. The patient's level of consciousness and vital signs were monitored continuously by radiology nursing throughout the procedure under my direct supervision. COMPLICATIONS: None immediate. PROCEDURE: Informed written consent was obtained from the patient after a thorough discussion of the procedural risks, benefits and alternatives. All questions were addressed. Maximal Sterile Barrier Technique was utilized including caps, mask, sterile gowns, sterile gloves, sterile drape, hand hygiene and skin antiseptic. A timeout was performed prior to the initiation of the procedure. Patient positioned supine on the procedure table. The left lower quadrant abdominal skin prepped and draped usual fashion. Following local lidocaine administration, the left lower quadrant fluid collection was entered with a 19 gauge Yueh needle utilizing CT guidance. The 19 gauge Yueh catheter was removed over 0.035 inch guidewire and replaced with 10.2 [place] multipurpose pigtail drain. 5 mL parent sample was aspirated and sent for Gram stain and culture. Post placement CT confirmed appropriate positioning of the abscess drain. The drain was secured to skin with suture and connected to bulb suction. IMPRESSION: CT-guided left lower quadrant abdominal 10 French abscess drain placement. Electronically Signed   By: Miachel Roux M.D.   On: 02/26/2022 13:52   CT ANGIO GI BLEED  Result Date: 02/25/2022 CLINICAL DATA:  Lower GI bleed (Ped 0-17y) Acute significant rectal bleeding  past hour - he is s/p sigmoid resection and colostomy for perforation a couple weeks ago EXAM: CTA ABDOMEN AND PELVIS WITHOUT AND WITH CONTRAST TECHNIQUE: Multidetector CT imaging of the abdomen and pelvis was performed using the standard protocol during bolus administration of intravenous contrast. Multiplanar reconstructed images and MIPs were obtained and reviewed to evaluate the vascular anatomy. RADIATION DOSE REDUCTION: This exam was performed according to the departmental dose-optimization program which includes automated exposure control, adjustment of the mA and/or kV according to  patient size and/or use of iterative reconstruction technique. CONTRAST:  182m OMNIPAQUE IOHEXOL 350 MG/ML SOLN COMPARISON:  CT AP, 02/19/2022 and 02/15/2022. FINDINGS: Suboptimal evaluation, secondary to motion degradation. VASCULAR Aorta: Normal caliber aorta without aneurysm, dissection, vasculitis or significant stenosis. Celiac: Patent without evidence of aneurysm, dissection, vasculitis or significant stenosis. SMA: Patent without evidence of aneurysm, dissection, vasculitis or significant stenosis. Renals: Both renal arteries are patent without evidence of aneurysm, dissection, vasculitis, fibromuscular dysplasia or significant stenosis. IMA: Patent without evidence of aneurysm, dissection, vasculitis or significant stenosis. Pelvis: Patent without evidence of aneurysm, dissection, vasculitis or significant stenosis. Proximal Outflow: Bilateral common femoral and visualized portions of the superficial and profunda femoral arteries are patent without evidence of aneurysm, dissection, vasculitis or significant stenosis. Veins: Portosystemic collateral arising off the SMV with rectal varix, and active extravasation within the rectum. Review of the MIP images confirms the above findings. NON-VASCULAR Lower chest: Small volume bilateral pleural effusions with adjacent dependent compressive atelectasis. Hepatobiliary:  Hepatomegaly, with the RIGHT hepatic measuring 22 cm. No focal liver abnormality is seen. Contacted gallbladder with trace pericholecystic ascites. No gallstones, gallbladder wall thickening, or biliary dilatation. Pancreas: No pancreatic ductal dilatation or surrounding inflammatory changes. Spleen: Normal in size without focal abnormality. Adrenals/Urinary Tract: Adrenal glands are unremarkable. Kidneys are normal, without renal calculi, focal lesion, or hydronephrosis. Bladder is unremarkable. Stomach/Bowel: Stomach is within normal limits. Postsurgical changes of LEFT hemicolectomy with end colostomy at the LEFT lower quadrant. Mildly edematous loops of bowel at the LEFT lower quadrant, with the staple line difficult to assess. Rectal variceal hemorrhage, as described above. Rectosigmoid distention of stool, with additional layering hemorrhage. Lymphatic: No enlarged abdominal or pelvic lymph nodes. Reproductive: Prostate is unremarkable. Other: Small fat containing parastomal hernia. Moderate volume intra-abdominal ascites, is seen within the perihepatic and perisplenic spaces, polyclonal gutters and layering within the RIGHT lower quadrant. Additional air-and-fluid containing, rim-enhancing fluid collection at the LEFT lower quadrant measures 6.7 x 7.3 x 9.5 cm (AP by transverse by CC), consistent with early abscess. Musculoskeletal: Obese with [place] abdomen. Moderate-to-moderate body wall edema, greatest within the flanks. No acute or significant osseous findings. IMPRESSION: VASCULAR 1. Examination is POSITIVE for acute extravasation within the rectum, with target vessel originating from a branch of the SMV consistent with venous hemorrhage. NON-VASCULAR 1. Postsurgical changes of hemicolectomy with colostomy at the LEFT lower quadrant. Small fat-containing containing parastomal hernia without evidence of obstruction. 2. Edematous loops of bowel in the LEFT lower quadrant, about the staple line, with  adjacent 10 cm a-and-fluid containing, rim enhancing fluid collection consistent with early abscess. 3.  Additional incidental, chronic and senescent findings as above. These results were called by telephone at the time of interpretation on 02/25/2022 at 4:20 pm to provider Dr MRush Landmark who verbally acknowledged these results. JMichaelle Birks MD [facility] Electronically Signed   By: JMichaelle BirksM.D.   On: 02/25/2022 16:50   VAS [place]PPER EXTREMITY VENOUS DUPLEX  Result Date: 02/25/2022 UPPER VENOUS STUDY  Patient Name:  KSABATINO WILLIARD Date of Exam:   02/25/2022 Medical Rec #: [id number]      Accession #:    [id number] of Birth: [birth year]      Patient Gender: M Patient Age:   89years [facility]      VAS [place]ER EXTREMITY VENOUS DUPLEX Referring Phys: DDwyane Dee--------------------------------------------------------------------------------  Indications: Edema Other Indications: IV to LUE ([facility]. Limitations:  Body habitus and poor ultrasound/tissue interface. Comparison Study: No previous exams Performing Technologist: Jody Hill RVT, RDMS  Examination Guidelines: A complete evaluation includes B-mode imaging, spectral Doppler, color Doppler, and power Doppler as needed of all accessible portions of each vessel. Bilateral testing is considered an integral part of a complete examination. Limited examinations for reoccurring indications may be performed as noted.  Right Findings: +----------+------------+---------+-----------+----------+--------------------+ RIGHT     CompressiblePhasicitySpontaneousProperties      Summary        +----------+------------+---------+-----------+----------+--------------------+ Subclavian               Yes       Yes                   patent by                                                              color/doppler      +----------+------------+---------+-----------+----------+--------------------+  Left Findings: +----------+------------+---------+-----------+----------+--------------------+ LEFT      CompressiblePhasicitySpontaneousProperties      Summary        +----------+------------+---------+-----------+----------+--------------------+ IJV           Full       Yes       Yes                                   +----------+------------+---------+-----------+----------+--------------------+ Subclavian    Full       Yes       Yes                                   +----------+------------+---------+-----------+----------+--------------------+ Axillary      Full       Yes       Yes                                   +----------+------------+---------+-----------+----------+--------------------+ Brachial      Full       Yes       Yes                                   +----------+------------+---------+-----------+----------+--------------------+ Radial                                              patent by color only +----------+------------+---------+-----------+----------+--------------------+ Ulnar                                               patent by color only +----------+------------+---------+-----------+----------+--------------------+ Cephalic      Full                                                       +----------+------------+---------+-----------+----------+--------------------+  Basilic       Full       Yes       Yes                                   +----------+------------+---------+-----------+----------+--------------------+ Ulnar and radial veins not well visualized due to overlying edema. Patent by color only.  Summary:  Right: No evidence of thrombosis in the subclavian.  Left: Difficult exam due to poor ultrasound/ tissue interface and diffuse subcutaneous edema of forearm. No obvious evidence of deep vein thrombosis in the upper extremity in areas  visualized. No obvious evidence of superficial vein thrombosis in the upper extremity in areas visualized.  *See table(s) above for measurements and observations.  Diagnosing physician: Monica Martinez MD Electronically signed by Monica Martinez MD on 02/25/2022 at 1:53:04 PM.    Final    CT ABDOMEN PELVIS W CONTRAST  Result Date: 02/19/2022 CLINICAL DATA:  Postoperative abdominal pain. EXAM: CT ABDOMEN AND PELVIS WITH CONTRAST TECHNIQUE: Multidetector CT imaging of the abdomen and pelvis was performed using the standard protocol following bolus administration of intravenous contrast. RADIATION DOSE REDUCTION: This exam was performed according to the departmental dose-optimization program which includes automated exposure control, adjustment of the mA and/or kV according to patient size and/or use of iterative reconstruction technique. CONTRAST:  118m OMNIPAQUE IOHEXOL 300 MG/ML  SOLN COMPARISON:  February 15, 2022. FINDINGS: Lower chest: Small bilateral pleural effusions are noted with probable bibasilar subsegmental atelectasis, right greater than left. Hepatobiliary: No cholelithiasis or biliary dilatation is noted. No acute abnormality seen in the liver. Pancreas: Unremarkable. No pancreatic ductal dilatation or surrounding inflammatory changes. Spleen: Normal in size without focal abnormality. Adrenals/Urinary Tract: Adrenal glands are unremarkable. Kidneys are normal, without renal calculi, focal lesion, or hydronephrosis. Bladder is unremarkable. Stomach/Bowel: Nasogastric tube tip is seen in proximal stomach. Status post colectomy with colostomy seen in left lower quadrant. Continued presence moderately dilated small bowel loops most consistent with ileus or less likely obstruction. Wall thickening is seen involving the distal duodenum and proximal jejunum suggesting enteritis. Vascular/Lymphatic: Aortic atherosclerosis. No enlarged abdominal or pelvic lymph nodes. Reproductive: Prostate is  unremarkable. Other: Mild amount of free fluid is noted throughout the abdomen which most likely is postoperative in etiology. No definite hernia is noted. Musculoskeletal: No acute or significant osseous findings. IMPRESSION: Status post colectomy with colostomy seen in left lower quadrant. Continued presence of moderately dilated small bowel most consistent with ileus or less likely distal small bowel obstruction. Wall and fold thickening of distal duodenum and proximal jejunum is noted suggesting possible edema or enteritis. Mild amount of free fluid is noted throughout the abdomen which most likely is postoperative in etiology. Small bilateral pleural effusions are noted with associated bibasilar subsegmental atelectasis, right greater than left. Electronically Signed   By: JMarijo ConceptionM.D.   On: 02/19/2022 15:45   DG Abd Portable 1V  Result Date: 02/19/2022 CLINICAL DATA:  Ileus. EXAM: PORTABLE ABDOMEN - 1 VIEW COMPARISON:  02/17/2022 FINDINGS: Nasogastric tube unchanged as it is coiled once over the stomach with tip over the gastric fundus in the left upper quadrant. Air is present throughout the colon. There are multiple air-filled dilated small bowel loops measuring up to 4.4 cm over the right lower quadrant. No evidence of free peritoneal air. Degenerative changes of the spine and hips. IMPRESSION: Multiple air-filled dilated small bowel loops measuring up to 4.4 cm  over the right lower quadrant. Findings may be due to ileus versus early/partial small bowel obstruction. Electronically Signed   By: Marin Olp M.D.   On: 02/19/2022 08:13   DG Abd Portable 1V  Result Date: 02/17/2022 CLINICAL DATA:  NG tube placement. EXAM: PORTABLE ABDOMEN - 1 VIEW COMPARISON:  02/10/2022 FINDINGS: An enteric tube is noted with coiled in the proximal-mid stomach. No other significant changes noted. IMPRESSION: Enteric tube with coiled in the proximal-mid stomach. Electronically Signed   By: Margarette Canada M.D.    On: 02/17/2022 17:21   DG Abd 2 Views  Result Date: 02/17/2022 CLINICAL DATA:  Small bowel obstruction. EXAM: ABDOMEN - 2 VIEW COMPARISON:  CT scan February 15, 2022 FINDINGS: The NG tube has been pulled back. The distal tip is just below the GE junction and the side port is approximately 6 or 7 cm above the GE junction. Dilated loops of small bowel remain in the lower abdomen. No other abnormalities. IMPRESSION: 1. The NG tube has been pulled back with the side port 6 or 7 cm above the GE junction. Recommend advancement. 2. Dilated loops of small bowel remain consistent with ileus versus small-bowel obstruction. These results will be called to the ordering clinician or representative by the Radiologist Assistant, and communication documented in the PACS or [employer]. Electronically Signed   By: Dorise Bullion III M.D.   On: 02/17/2022 11:46   CT ABDOMEN PELVIS WO CONTRAST  Result Date: 02/15/2022 CLINICAL DATA:  History of prior laparoscopy with colonic resection and colostomy, persistent abdominal pain and distension. EXAM: CT ABDOMEN AND PELVIS WITHOUT CONTRAST TECHNIQUE: Multidetector CT imaging of the abdomen and pelvis was performed following the standard protocol without IV contrast. RADIATION DOSE REDUCTION: This exam was performed according to the departmental dose-optimization program which includes automated exposure control, adjustment of the mA and/or kV according to patient size and/or use of iterative reconstruction technique. COMPARISON:  02/12/2022 FINDINGS: Lower chest: Bibasilar consolidation is noted with small right-sided pleural effusion. Hepatobiliary: Liver is well visualized and within normal limits. Gallbladder demonstrates vicarious excretion of contrast material. Perihepatic fluid is noted similar to that seen on the prior exam. Pancreas: Unremarkable. No pancreatic ductal dilatation or surrounding inflammatory changes. Spleen: Normal in size without focal abnormality.  Adrenals/Urinary Tract: Adrenal glands are within normal limits. Kidneys demonstrate no renal calculi or obstructive changes. The bladder is partially distended. Stomach/Bowel: Hartmann's pouch is again identified. Left-sided colostomy is seen. The more proximal colon is unremarkable. The appendix is within normal limits. Stomach is decompressed by a gastric catheter. Mildly dilated loops of small bowel are noted particularly within the right abdomen somewhat progressed when compared with the prior exam. No transition zone is identified. Vascular/Lymphatic: Aortic atherosclerosis. No enlarged abdominal or pelvic lymph nodes. Reproductive: Prostate is unremarkable. Other: Fluid is noted throughout the abdomen to that seen on the prior exam. This is most prominent along the right pericolic gutter and perihepatic regions although similar to the prior study. Small amount of free fluid and air is seen in the hip paddle renal fossa stable from the prior exam likely related to the prior surgery. No discrete abscess is noted. Musculoskeletal: No acute or significant osseous findings. IMPRESSION: Postsurgical changes as described. Free fluid is noted within the abdomen relatively stable in appearance from the prior exam. No discrete abscess is noted at this time. Multiple mildly dilated loops of small bowel in the right mid abdomen slightly greater than that seen on the prior exam.  This is again likely related to a postoperative ileus. Correlate with the physical exam. Right-sided pleural effusion and bibasilar consolidation. Electronically Signed   By: Inez Catalina M.D.   On: 02/15/2022 02:12   [place] EKG SITE RITE  Result Date: 02/12/2022 If Site Rite image not attached, placement could not be confirmed due to current cardiac rhythm.  CT ABDOMEN PELVIS WO CONTRAST  Result Date: 02/12/2022 CLINICAL DATA:  Five days postop exploratory laparotomy and left colectomy and colostomy. Rising white blood cell count. EXAM: CT  ABDOMEN AND PELVIS WITHOUT CONTRAST TECHNIQUE: Multidetector CT imaging of the abdomen and pelvis was performed following the standard protocol without IV contrast. RADIATION DOSE REDUCTION: This exam was performed according to the departmental dose-optimization program which includes automated exposure control, adjustment of the mA and/or kV according to patient size and/or use of iterative reconstruction technique. COMPARISON:  CT scan 02/07/2022 FINDINGS: Lower chest: Small bilateral pleural effusions and moderate overlying atelectasis. Hepatobiliary: No hepatic lesions or intrahepatic biliary dilatation. High attenuation material in the gallbladder is likely vicarious excretion related to the prior CT scan. No common bile duct dilatation. Pancreas: No mass, inflammation or ductal dilatation. Spleen: Normal size.  No focal lesions. Adrenals/Urinary Tract: The adrenal glands and kidneys are unremarkable. The bladder is unremarkable. Stomach/Bowel: The stomach contains an NG tube. The duodenum is unremarkable. There are mildly dilated small bowel loops with scattered air-fluid levels, likely postop ileus. Patient has had a left colectomy. There is a left transverse colon colostomy and a Hartmann's pouch. Vascular/Lymphatic: The aorta is normal in caliber. Small scattered mesenteric and retroperitoneal lymph nodes. Reproductive: The prostate gland and seminal vesicles are unremarkable. Other: Scattered free fluid is noted in the abdomen and pelvis. There is also a small amount of free air and free fluid in the hepatic renal fossa but I do not see a discrete drainable abscess. I do not see a discrete Musculoskeletal: No significant bony findings. IMPRESSION: 1. Status post left colectomy with a left transverse colon colostomy and a Hartmann's pouch. 2. Scattered free fluid in the abdomen and pelvis and a small amount of free air and free fluid in the hepatorenal fossa but I do not see a discrete drainable abscess. 3.  Mildly dilated small bowel loops with scattered air-fluid levels, likely postop ileus. 4. Small bilateral pleural effusions and moderate overlying atelectasis. Electronically Signed   By: Marijo Sanes M.D.   On: 02/12/2022 15:34   DG CHEST PORT 1 VIEW  Result Date: 02/11/2022 CLINICAL DATA:  Fever. EXAM: PORTABLE CHEST 1 VIEW COMPARISON:  AP chest 02/07/2022, CT chest 02/07/2022 FINDINGS: An enteric tube descends below the diaphragm with the tip excluded by collimation, new from prior 02/07/2022 radiograph. Cardiac silhouette is again moderately enlarged. Mediastinal contours are within normal limits. Moderately decreased lung volumes. Lateral left lower lung horizontal linear subsegmental atelectasis versus scarring, similar to prior radiograph and CT. No pneumothorax. No definite pleural effusion. No acute skeletal abnormality. IMPRESSION: 1. Moderately decreased lung volumes with unchanged left basilar subsegmental atelectasis. 2. New enteric tube descends below the diaphragm. Electronically Signed   By: Yvonne Kendall M.D.   On: 02/11/2022 09:48   DG Abd 1 View  Result Date: 02/10/2022 CLINICAL DATA:  Check gastric catheter placement EXAM: ABDOMEN - 1 VIEW COMPARISON:  None Available. FINDINGS: Gastric catheter is noted extending into the stomach. Scattered large and small bowel gas is noted. No free air is seen. IMPRESSION: Gastric catheter within the stomach. Electronically Signed   By:  Inez Catalina M.D.   On: 02/10/2022 19:49   CT Angio Chest PE W and/or Wo Contrast  Result Date: 02/07/2022 CLINICAL DATA:  Pulmonary embolism (PE) suspected, high prob EXAM: CT ANGIOGRAPHY CHEST WITH CONTRAST TECHNIQUE: Multidetector CT imaging of the chest was performed using the standard protocol during bolus administration of intravenous contrast. Multiplanar CT image reconstructions and MIPs were obtained to evaluate the vascular anatomy. RADIATION DOSE REDUCTION: This exam was performed according to the  departmental dose-optimization program which includes automated exposure control, adjustment of the mA and/or kV according to patient size and/or use of iterative reconstruction technique. CONTRAST:  144m OMNIPAQUE IOHEXOL 350 MG/ML SOLN COMPARISON:  None Available. FINDINGS: Cardiovascular: Mild cardiomegaly. Scattered coronary artery and aortic calcifications. No aneurysm. Mediastinum/Nodes: No mediastinal, hilar, or axillary adenopathy. Trachea and esophagus are unremarkable. Thyroid unremarkable. Lungs/Pleura: No pleural effusions.  Bibasilar atelectasis. Upper Abdomen: See abdominal CT report Musculoskeletal: Chest wall soft tissues are unremarkable. No acute bony abnormality. Review of the MIP images confirms the above findings. IMPRESSION: No evidence of pulmonary embolus. Bibasilar atelectasis. No acute cardiopulmonary disease. Electronically Signed   By: KRolm BaptiseM.D.   On: 02/07/2022 23:32   CT ABDOMEN PELVIS W CONTRAST  Result Date: 02/07/2022 CLINICAL DATA:  Sepsis EXAM: CT ABDOMEN AND PELVIS WITH CONTRAST TECHNIQUE: Multidetector CT imaging of the abdomen and pelvis was performed using the standard protocol following bolus administration of intravenous contrast. RADIATION DOSE REDUCTION: This exam was performed according to the departmental dose-optimization program which includes automated exposure control, adjustment of the mA and/or kV according to patient size and/or use of iterative reconstruction technique. CONTRAST:  1050mOMNIPAQUE IOHEXOL 350 MG/ML SOLN COMPARISON:  None Available. FINDINGS: Lower chest: Bibasilar atelectasis. Hepatobiliary: No focal hepatic abnormality. Gallbladder unremarkable. Pancreas: No focal abnormality or ductal dilatation. Spleen: No focal abnormality.  Normal size. Adrenals/Urinary Tract: No adrenal abnormality. No focal renal abnormality. No stones or hydronephrosis. Urinary bladder is unremarkable. Stomach/Bowel: Areas of bowel thickening within the  transverse colon and descending colon. There also several mid small bowel loops which are thick walled in the lower abdomen and upper pelvis. Stomach grossly unremarkable. No bowel obstruction. Vascular/Lymphatic: Scattered aortic calcifications. No evidence of aneurysm or adenopathy. Reproductive: No visible focal abnormality. Other: There is moderate free fluid around the liver, spleen, in the right paracolic gutter and in the pelvis. Moderate free air is noted. Exact source is difficult to determine. Fluid and gas noted near the thick walled small bowel loop in the lower abdomen/upper pelvis which is possibly the source. Musculoskeletal: No acute bony abnormality. IMPRESSION: Findings compatible with perforated bowel with moderate free fluid and free air. Exact source not definitely visualized. Possibilities include a thick walled small bowel loop in the lower abdomen/upper pelvis, likely mid small bowel for the transverse colon/descending colon. Bibasilar atelectasis. Aortic atherosclerosis. Critical Value/emergent results were called by telephone at the time of interpretation on 02/07/2022 at 11:30 pm to [facility] who verbally acknowledged these results. Electronically Signed   By: KeRolm Baptise.D.   On: 02/07/2022 23:30   DG Abd Portable 2 Views  Result Date: 02/07/2022 CLINICAL DATA:  sepsis EXAM: PORTABLE ABDOMEN - 2 VIEW COMPARISON:  None Available. FINDINGS: Limited evaluation due to overlapping osseous structures and overlying soft tissues. Gaseous distension of the large bowel. Question gaseous dilatation of a focal loop of small bowel within the right lower abdomen. There is no evidence of free air. No radio-opaque calculi or other significant radiographic abnormality  is seen. IMPRESSION: Question gaseous dilatation of a focal loop of small bowel within the right lower abdomen. Limited evaluation due to overlapping osseous structures and overlying soft tissues. If clinically  indicated, please consider CT abdomen pelvis with intravenous contrast for further evaluation. Electronically Signed   By: Iven Finn M.D.   On: 02/07/2022 21:28   DG Chest Port 1 View  Result Date: 02/07/2022 CLINICAL DATA:  Questionable sepsis - evaluate for abnormality EXAM: PORTABLE CHEST 1 VIEW COMPARISON:  Chest x-ray 06/13/2011 FINDINGS: The heart and mediastinal contours are unchanged. Low lung volumes with bibasilar airspace opacities likely representing atelectasis. No focal consolidation. No pulmonary edema. No pleural effusion. No pneumothorax. No acute osseous abnormality. IMPRESSION: Low lung volumes with no definite acute cardiopulmonary abnormality. Electronically Signed   By: Iven Finn M.D.   On: 02/07/2022 21:19    Microbiology: Results for orders placed or performed during the hospital encounter of 02/07/22  Urine Culture     Status: Abnormal   Collection Time: 02/07/22  9:14 AM   Specimen: In/Out Cath Urine  Result Value Ref Range Status   Specimen Description   Final    IN/OUT CATH URINE Performed at [facility] [address], [place], [place] [address]    Special Requests   Final    NONE Performed at [facility], [place] [address], [place], [place] [address]    Culture (A)  Final    1,000 COLONIES/mL STAPHYLOCOCCUS EPIDERMIDIS CALL MICROBIOLOGY LAB IF SENSITIVITIES ARE REQUIRED. Performed at [facility], [place] [address] [place], [place] [address]    Report Status 02/09/2022 FINAL  Final  Blood Culture (routine x 2)     Status: None   Collection Time: 02/07/22  8:29 PM   Specimen: BLOOD  Result Value Ref Range Status   Specimen Description   Final    BLOOD LEFT ANTECUBITAL Performed at [facility] [address], [place], [place] [address]    Special Requests   Final    BOTTLES DRAWN AEROBIC AND ANAEROBIC Blood Culture results may not be optimal due to an excessive volume of blood  received in culture bottles Performed at [facility] [address], [place], [place] [address]    Culture   Final    NO GROWTH 5 DAYS Performed at [facility], [place] [address] [place], [place] [address]    Report Status 02/12/2022 FINAL  Final  Blood Culture (routine x 2)     Status: None   Collection Time: 02/07/22  8:30 PM   Specimen: BLOOD LEFT ARM  Result Value Ref Range Status   Specimen Description   Final    BLOOD LEFT ARM Performed at [facility], [place] [address], [place], [place] [address]    Special Requests   Final    BOTTLES DRAWN AEROBIC AND ANAEROBIC Blood Culture adequate volume Performed at [facility] [address] [place], [place] [address]    Culture   Final    NO GROWTH 5 DAYS Performed at [facility], [place] [address], [place],  [address]    Report Status 02/12/2022 FINAL  Final  Resp Panel by RT-PCR (Flu A&B, Covid) Anterior Nasal Swab     Status: None   Collection Time: 02/07/22  9:25 PM   Specimen: Anterior Nasal Swab  Result Value Ref Range Status   SARS Coronavirus 2 by RT PCR NEGATIVE NEGATIVE Final  Comment: (NOTE) SARS-CoV-2 target nucleic acids are NOT DETECTED.  The SARS-CoV-2 RNA is generally detectable in upper respiratory specimens during the acute phase of infection. The lowest concentration of SARS-CoV-2 viral copies this assay can detect is 138 copies/mL. A negative result does not preclude SARS-Cov-2 infection and should not be used as the sole basis for treatment or other patient management decisions. A negative result may occur with  improper specimen collection/handling, submission of specimen other than nasopharyngeal swab, presence of viral mutation(s) within the areas targeted by this assay, and inadequate number of viral copies(<138 copies/mL). A negative result must be combined with clinical observations, patient history, and epidemiological information.  The expected result is Negative.  Fact Sheet for Patients:  EntrepreneurPulse.com.au  Fact Sheet for Healthcare Providers:  IncredibleEmployment.be  This test is no t yet approved or cleared by the [place] FDA and  has been authorized for detection and/or diagnosis of SARS-CoV-2 by FDA under an Emergency Use Authorization (EUA). This EUA will remain  in effect (meaning this test can be used) for the duration of the COVID-19 declaration under Section 564(b)(1) of the Act, 21 U.S.C.section 360bbb-3(b)(1), unless the authorization is terminated  or revoked sooner.       Influenza A by PCR NEGATIVE NEGATIVE Final   Influenza B by PCR NEGATIVE NEGATIVE Final    Comment: (NOTE) The Xpert Xpress SARS-CoV-2/FLU/RSV plus assay is intended as an aid in the diagnosis of influenza from Nasopharyngeal swab specimens and should not be used as a sole basis for treatment. Nasal washings and aspirates are unacceptable for Xpert Xpress SARS-CoV-2/FLU/RSV testing.  Fact Sheet for Patients: EntrepreneurPulse.com.au  Fact Sheet for Healthcare Providers: IncredibleEmployment.be  This test is not yet approved or cleared by the [place] FDA and has been authorized for detection and/or diagnosis of SARS-CoV-2 by FDA under an Emergency Use Authorization (EUA). This EUA will remain in effect (meaning this test can be used) for the duration of the COVID-19 declaration under Section 564(b)(1) of the Act, 21 U.S.C. section 360bbb-3(b)(1), unless the authorization is terminated or revoked.  Performed at [facility], [place] [address] [place], [place] [address]   MRSA Next Gen by PCR, Nasal     Status: None   Collection Time: 02/08/22  4:41 AM   Specimen: Nasal Mucosa; Nasal Swab  Result Value Ref Range Status   MRSA by PCR Next Gen NOT DETECTED NOT DETECTED Final    Comment: (NOTE) The GeneXpert MRSA  Assay (FDA approved for NASAL specimens only), is one component of a comprehensive MRSA colonization surveillance program. It is not intended to diagnose MRSA infection nor to guide or monitor treatment for MRSA infections. Test performance is not FDA approved in patients less than 23 years old. Performed at [facility], [place], [place] [address], [place], [place] [address]   Urine Culture     Status: None   Collection Time: 02/09/22 12:56 PM   Specimen: Urine, Clean Catch  Result Value Ref Range Status   Specimen Description   Final    URINE, CLEAN CATCH Performed at [facility], [place] [address] [place], [place] [address]    Special Requests   Final    NONE Performed at [facility], [place] [address], [place], [place] [address]    Culture   Final    NO GROWTH Performed at [facility], [place] [address], [place], [place] [address]    Report Status 02/10/2022 FINAL  Final  Culture, blood (Routine X 2) w Reflex to ID Panel     Status: None   Collection Time: 02/11/22  6:33 AM   Specimen: BLOOD  Result Value Ref Range Status   Specimen Description   Final    BLOOD SITE NOT SPECIFIED Performed at [place] [facility] [address] [place], [place] [address]    Special Requests   Final    IN PEDIATRIC BOTTLE Blood Culture results may not be optimal due to an inadequate volume of blood received in culture bottles Performed at [place] [address] [place], [place] [address]    Culture   Final    NO GROWTH 5 DAYS Performed at [facility], [place] [address], [place], [place] [address]    Report Status 02/16/2022 FINAL  Final  Culture, blood (Routine X 2) w Reflex to ID Panel     Status: None   Collection Time: 02/11/22  6:33 AM   Specimen: BLOOD  Result Value Ref Range Status   Specimen Description   Final    BLOOD SITE NOT SPECIFIED Performed at [facility] [address] [place], [place] [address]    Special Requests   Final    IN PEDIATRIC BOTTLE Blood Culture results may not be optimal due to an inadequate volume of blood received in culture bottles Performed at [facility] [address] [place], [place] [address]    Culture   Final    NO GROWTH 5 DAYS Performed at [facility], [place] [address], [place], [place] [address]    Report Status 02/16/2022 FINAL  Final  Acid Fast Smear (AFB)     Status: None   Collection Time: 02/11/22  4:15 PM   Specimen: Sputum  Result Value Ref Range Status   AFB Specimen Processing Concentration  Final   Acid Fast Smear Negative  Final    Comment: (NOTE) Performed At: [facility] [place], [place] [id number] Rush Farmer MD [id number]    Source (AFB) UNABLE TO QUANTITATE  Final    Comment: Performed at [facility], [place] [address] [place], [place] [address]  Acid Fast Smear (AFB)     Status: None   Collection Time: 02/12/22  7:40 AM   Specimen: Sputum  Result Value Ref Range Status   AFB Specimen Processing Concentration  Final   Acid Fast Smear Negative  Final    Comment: (NOTE) Performed At: [facility] [place], [place] [id number] Rush Farmer MD [id number]    Source (AFB) EXPECTORATED SPUTUM  Final    Comment: Performed at [facility], [place] [address], [place], [place] [address]  Acid Fast Smear (AFB)     Status: None   Collection Time: 02/13/22  4:59 AM   Specimen: Sputum  Result Value Ref Range Status   AFB Specimen Processing Concentration  Final   Acid Fast Smear Negative  Final    Comment: (NOTE) Performed At: [facility] [place], [place] [id number] Rush Farmer MD [id number]    Source (AFB) SPUTUM  Final    Comment: Performed at [facility], [place] [address], [place], [place] [address]  Acid Fast Smear (AFB)      Status: None   Collection Time: 02/14/22  8:00 AM   Specimen: Sputum  Result Value Ref Range Status   AFB Specimen Processing Concentration  Final   Acid  Fast Smear Negative  Final    Comment: (NOTE) Performed At: [facility] [place], [place] [id number] Rush Farmer MD [id number]    Source (AFB) EXPECTORATED SPUTUM  Final    Comment: Performed at [facility], [place] [address] [place], [place] [address]  Acid Fast Smear (AFB)     Status: None   Collection Time: 02/14/22  5:15 PM   Specimen: Sputum  Result Value Ref Range Status   AFB Specimen Processing Concentration  Final   Acid Fast Smear Negative  Final    Comment: (NOTE) Performed At: [facility] [place], [place] [id number] Rush Farmer MD [id number]    Source (AFB) EXPECTORATED SPUTUM  Final    Comment: Performed at [facility], [place] [address], [place], [place] [address]  Culture, blood (Routine X 2) w Reflex to ID Panel     Status: None   Collection Time: 02/15/22  9:01 AM   Specimen: BLOOD  Result Value Ref Range Status   Specimen Description   Final    BLOOD BLOOD LEFT ARM Performed at [facility] [address], [place], [place] [address]    Special Requests   Final    BOTTLES DRAWN AEROBIC ONLY BCAV Performed at [place] [address], [place], [place] [address]    Culture   Final    NO GROWTH 5 DAYS Performed at [facility], [place] [address] [place], [place] [address]    Report Status 02/20/2022 FINAL  Final  Culture, blood (Routine X 2) w Reflex to ID Panel     Status: None   Collection Time: 02/15/22  9:01 AM   Specimen: BLOOD  Result Value Ref Range Status   Specimen Description   Final    BLOOD BLOOD LEFT HAND Performed at [place] [address], [place], [place] [address]    Special Requests   Final    BOTTLES DRAWN AEROBIC ONLY  BCAV Performed at [place] [address] [place], [place] [address]    Culture   Final    NO GROWTH 5 DAYS Performed at [facility], [place] [address] [place], [place] [address]    Report Status 02/20/2022 FINAL  Final  MRSA Next Gen by PCR, Nasal     Status: None   Collection Time: 02/15/22 12:42 PM   Specimen: Nasal Mucosa; Nasal Swab  Result Value Ref Range Status   MRSA by PCR Next Gen NOT DETECTED NOT DETECTED Final    Comment: (NOTE) The GeneXpert MRSA Assay (FDA approved for NASAL specimens only), is one component of a comprehensive MRSA colonization surveillance program. It is not intended to diagnose MRSA infection nor to guide or monitor treatment for MRSA infections. Test performance is not FDA approved in patients less than 48 years old. Performed at [facility], [place] [address], [place], [place] [address]   Aerobic/Anaerobic Culture w Gram Stain (surgical/deep wound)     Status: None (Preliminary result)   Collection Time: 02/26/22 12:24 PM   Specimen: Abscess  Result Value Ref Range Status   Specimen Description   Final    ABSCESS LLQ ABDOMINAL Performed at [facility] [address] [place], [place] [address]    Special Requests   Final    NONE Performed at [facility], [place] [address], [place],  [address]    Gram Stain  Final    ABUNDANT WBC PRESENT, PREDOMINANTLY PMN MODERATE GRAM NEGATIVE RODS    Culture   Final    ABUNDANT ESCHERICHIA COLI FEW BACTEROIDES THETAIOTAOMICRON BETA LACTAMASE NEGATIVE Performed at [facility], [place] [address] [place], [place] [address]    Report Status PENDING  Incomplete   Organism ID, Bacteria ESCHERICHIA COLI  Final      Susceptibility   Escherichia coli - MIC*    AMPICILLIN >=32 RESISTANT Resistant     CEFAZOLIN <=4 SENSITIVE Sensitive     CEFEPIME <=0.12 SENSITIVE Sensitive     CEFTAZIDIME <=1 SENSITIVE  Sensitive     CEFTRIAXONE <=0.25 SENSITIVE Sensitive     CIPROFLOXACIN >=4 RESISTANT Resistant     GENTAMICIN >=16 RESISTANT Resistant     IMIPENEM <=0.25 SENSITIVE Sensitive     TRIMETH/SULFA <=20 SENSITIVE Sensitive     AMPICILLIN/SULBACTAM 16 INTERMEDIATE Intermediate     PIP/TAZO <=4 SENSITIVE Sensitive     * ABUNDANT ESCHERICHIA COLI  Body fluid culture w Gram Stain     Status: None (Preliminary result)   Collection Time: 02/26/22 12:24 PM   Specimen: Ascitic; Body Fluid  Result Value Ref Range Status   Specimen Description   Final    ASCITIC Performed at [facility] [address], [place], [place] [address]    Special Requests   Final    NONE Performed at [facility], [place] [address], [place], [place] [address]    Gram Stain   Final    WBC PRESENT, PREDOMINANTLY MONONUCLEAR NO ORGANISMS SEEN CYTOSPIN SMEAR    Culture   Final    NO GROWTH 3 DAYS Performed at [facility], [place] [address] [place] [address], [place], [place] [address]    Report Status PENDING  Incomplete    Labs: CBC: Recent Labs  Lab 02/25/22 1201 02/25/22 1329 02/26/22 0521 02/26/22 1606 02/27/22 0225 02/27/22 0812 02/27/22 1730 02/28/22 0410 03/01/22 0447  WBC 5.9  --  4.8  --  5.3  --   --  5.4 7.3  NEUTROABS 4.7  --  3.7  --  4.0  --   --  4.8 5.7  HGB 8.6*   < > 8.1*   < > 7.8* 8.1* 8.2* 8.4* 8.1*  HCT 27.2*   < > 25.1*   < > 24.6* 25.4* 26.0* 26.6* 25.9*  MCV 101.1*  --  95.4  --  97.2  --   --  97.8 99.6  PLT 296  --  205  --  203  --   --  223 262   < > = values in this interval not displayed.   Basic Metabolic Panel: Recent Labs  Lab 02/25/22 0550 02/26/22 0521 02/27/22 0225 02/28/22 0410 03/01/22 0447  NA 138 140 140 140 142  K 3.8 3.5 3.5 4.2 4.0  CL 100 102 101 101 103  CO2 32 34* 33* 30 30  GLUCOSE 132* 115* 187* 275* 252*  BUN '9 11 11 11 16  '$ CREATININE 0.34* <0.30* 0.42* 0.37* 0.35*  CALCIUM 8.0* 7.8* 8.0* 8.0* 8.0*  MG 1.6* 1.8 1.6*  1.8 1.8  PHOS 3.3 3.3 2.7 2.7 2.3*   Liver Function Tests: Recent Labs  Lab 02/25/22 0550 02/26/22 0521 02/27/22 0225 02/28/22 0410 03/01/22 0447  AST '20 15 16 16 15  '$ ALT '15 11 10 10 10  '$ ALKPHOS 138* 84 80 98 97  BILITOT 0.6 0.7 0.4 0.6 0.3  PROT 5.9* 5.4* 5.3* 6.2* 5.8*  ALBUMIN 2.0* 2.4* 2.8* 2.7* 2.3*   CBG: Recent Labs  Lab 02/28/22 1208 02/28/22 1705 02/28/22 2108 03/01/22 0808 03/01/22 1210  GLUCAP 223*  223* 312* 317* 224* 275*    Discharge time spent: greater than 30 minutes.  Signed: Vernelle Emerald, MD [facility] 03/01/2022

## 2022-03-01 NOTE — Progress Notes (Signed)
Patient arrived and oriented to unit. Spouse at bedside. MD checked abdominal incision, wait until dressing change ordered tomorrow to re-dress wound per MD. Fissure noted in gluteal fold, foam placed. Generalized edema noted, esp BLE and LUE. Spouse said was improved from prior state. Elevated arm and legs. No questions or concerns at this time.

## 2022-03-02 ENCOUNTER — Inpatient Hospital Stay (HOSPITAL_COMMUNITY): Payer: 59

## 2022-03-02 DIAGNOSIS — K50118 Crohn's disease of large intestine with other complication: Secondary | ICD-10-CM | POA: Diagnosis not present

## 2022-03-02 DIAGNOSIS — K50119 Crohn's disease of large intestine with unspecified complications: Secondary | ICD-10-CM

## 2022-03-02 DIAGNOSIS — E1165 Type 2 diabetes mellitus with hyperglycemia: Secondary | ICD-10-CM

## 2022-03-02 DIAGNOSIS — D62 Acute posthemorrhagic anemia: Secondary | ICD-10-CM | POA: Diagnosis not present

## 2022-03-02 DIAGNOSIS — R5381 Other malaise: Secondary | ICD-10-CM | POA: Diagnosis not present

## 2022-03-02 DIAGNOSIS — Z933 Colostomy status: Secondary | ICD-10-CM

## 2022-03-02 DIAGNOSIS — F909 Attention-deficit hyperactivity disorder, unspecified type: Secondary | ICD-10-CM

## 2022-03-02 DIAGNOSIS — K631 Perforation of intestine (nontraumatic): Secondary | ICD-10-CM

## 2022-03-02 LAB — AEROBIC/ANAEROBIC CULTURE W GRAM STAIN (SURGICAL/DEEP WOUND)

## 2022-03-02 LAB — BODY FLUID CULTURE W GRAM STAIN: Culture: NO GROWTH

## 2022-03-02 LAB — GLUCOSE, CAPILLARY
Glucose-Capillary: 165 mg/dL — ABNORMAL HIGH (ref 70–99)
Glucose-Capillary: 281 mg/dL — ABNORMAL HIGH (ref 70–99)
Glucose-Capillary: 267 mg/dL — ABNORMAL HIGH (ref 70–99)
Glucose-Capillary: 235 mg/dL — ABNORMAL HIGH (ref 70–99)

## 2022-03-02 MED ORDER — PEG-KCL-NACL-NASULF-NA ASC-C 100 G PO SOLR: 1.0000 | Freq: Once | ORAL | Status: DC

## 2022-03-02 MED ORDER — GUAIFENESIN ER 600 MG PO TB12
600.0000 mg | ORAL_TABLET | Freq: Two times a day (BID) | ORAL | Status: DC
  Administered 2022-03-02 – 2022-03-04 (×5): 600 mg via ORAL
  Filled 2022-03-02 (×5): qty 1

## 2022-03-02 MED ORDER — METHYLPHENIDATE HCL ER (OSM) 18 MG PO TBCR
18.0000 mg | EXTENDED_RELEASE_TABLET | Freq: Once | ORAL | Status: DC
  Filled 2022-03-02: qty 1

## 2022-03-02 MED ORDER — INSULIN GLARGINE-YFGN 100 UNIT/ML ~~LOC~~ SOLN
5.0000 [IU] | Freq: Every day | SUBCUTANEOUS | Status: DC
  Administered 2022-03-02: 5 [IU] via SUBCUTANEOUS
  Filled 2022-03-02 (×2): qty 0.05

## 2022-03-02 MED ORDER — PEG-KCL-NACL-NASULF-NA ASC-C 100 G PO SOLR
0.5000 | Freq: Once | ORAL | Status: DC
  Filled 2022-03-02: qty 1

## 2022-03-02 MED ORDER — FUROSEMIDE 10 MG/ML IJ SOLN
20.0000 mg | Freq: Once | INTRAMUSCULAR | Status: AC
  Administered 2022-03-02: 20 mg via INTRAVENOUS
  Filled 2022-03-02: qty 2

## 2022-03-02 MED ORDER — CHLORHEXIDINE GLUCONATE CLOTH 2 % EX PADS
6.0000 | MEDICATED_PAD | Freq: Two times a day (BID) | CUTANEOUS | Status: DC
  Administered 2022-03-02 – 2022-03-04 (×4): 6 via TOPICAL

## 2022-03-02 MED ORDER — CHLORHEXIDINE GLUCONATE CLOTH 2 % EX PADS: 6.0000 | MEDICATED_PAD | Freq: Every day | CUTANEOUS | Status: DC

## 2022-03-02 MED ORDER — METHYLPHENIDATE HCL ER 20 MG PO TBCR: 20.0000 mg | EXTENDED_RELEASE_TABLET | Freq: Every day | ORAL | Status: DC | PRN

## 2022-03-02 NOTE — Progress Notes (Signed)
Progress Note     Subjective: Transferred to CIR yesterday. Surgically stable.   Objective: Vital signs in last 24 hours: Temp:  [98.7 F (37.1 C)-98.9 F (37.2 C)] 98.7 F (37.1 C) (12/16 0443) Pulse Rate:  [71-110] 110 (12/16 0802) Resp:  [16-29] 16 (12/16 0443) BP: (118-145)/(66-81) 120/73 (12/16 0802) SpO2:  [92 %-97 %] 94 % (12/16 0443) Weight:  [119.9 kg] 119.9 kg (12/15 1432) Last BM Date : 03/01/22  Intake/Output from previous day: 12/15 0701 - 12/16 0700 In: 349.4 [P.O.:240; I.V.:9.4; IV Piggyback:100] Out: 720 [Urine:275; Drains:20; Stool:425] Intake/Output this shift: No intake/output data recorded.  PE: General: pleasant, WD, obese male who is laying in bed in NAD Heart: regular, rate, and rhythm.   Lungs: Respiratory effort nonlabored Abd: soft, NT, mild distention, ostomy productive of stool, some ulceration on stoma but mostly viable and stoma is patent, midline wound clean with small amount fibrinous exudate centrally, drain in LLQ with lacteal drainage MS: edema of LUE Skin: truncal rash    Lab Results:  Recent Labs    02/28/22 0410 03/01/22 0447  WBC 5.4 7.3  HGB 8.4* 8.1*  HCT 26.6* 25.9*  PLT 223 262    BMET Recent Labs    02/28/22 0410 03/01/22 0447  NA 140 142  K 4.2 4.0  CL 101 103  CO2 30 30  GLUCOSE 275* 252*  BUN 11 16  CREATININE 0.37* 0.35*  CALCIUM 8.0* 8.0*    PT/INR No results for input(s): "LABPROT", "INR" in the last 72 hours.  CMP     Component Value Date/Time   NA 142 03/01/2022 0447   K 4.0 03/01/2022 0447   CL 103 03/01/2022 0447   CO2 30 03/01/2022 0447   GLUCOSE 252 (H) 03/01/2022 0447   BUN 16 03/01/2022 0447   CREATININE 0.35 (L) 03/01/2022 0447   CREATININE 0.63 (L) 03/10/2019 1436   CALCIUM 8.0 (L) 03/01/2022 0447   PROT 5.8 (L) 03/01/2022 0447   ALBUMIN 2.3 (L) 03/01/2022 0447   AST 15 03/01/2022 0447   ALT 10 03/01/2022 0447   ALKPHOS 97 03/01/2022 0447   BILITOT 0.3 03/01/2022 0447    GFRNONAA >60 03/01/2022 0447   Lipase  No results found for: "LIPASE"     Studies/Results: No results found.  Anti-infectives: Anti-infectives (From admission, onward)    Start     Dose/Rate Route Frequency Ordered Stop   03/01/22 1600  piperacillin-tazobactam (ZOSYN) IVPB 3.375 g  Status:  Discontinued        3.375 g 12.5 mL/hr over 240 Minutes Intravenous Every 8 hours 03/01/22 1351 03/01/22 1439   03/01/22 1530  cefTRIAXone (ROCEPHIN) injection 2 g  Status:  Discontinued        2 g Intramuscular Every 24 hours 03/01/22 1439 03/01/22 1442   03/01/22 1530  metroNIDAZOLE (FLAGYL) IVPB 500 mg        500 mg 100 mL/hr over 60 Minutes Intravenous Every 12 hours 03/01/22 1439     03/01/22 1530  cefTRIAXone (ROCEPHIN) 2 g in sodium chloride 0.9 % 100 mL IVPB        2 g 200 mL/hr over 30 Minutes Intravenous Every 24 hours 03/01/22 1442          Assessment/Plan  Sigmoid perforation  POD23 S/P exploratory laparotomy, colon resection, end colostomy 02/07/22 Dr. [name] - having ostomy output, continue fiber and iron   - CT 12/1 with some free fluid but no organized collection, CT 12/5 with no major changes -  BRBPR 12/11 - s/p flex-sig which showed bleeding ulcerations. CTA also showed LLQ abscess - hgb stable this AM, s/p 4 units PRBC 12/11 - s/p IR drain placement and paracentesis 12/12 - Cxs with E.coli, abx narrowed to rocephin/flagyl - steroids for acute Crohn's initiated 12/13 - GI planning c-scope, will follow up results of this - will see again Monday/Tuesday for surgery    FEN: reg diet; cont iron and vit c VTE: LMWH on hold  ID: vanc/flagyl/cefepime 11/23; zosyn 11/24>11/29; vanc 12/1>12/4; merrem 12/1> 12/5; Zosyn 12/11>12/15; Rocephin/flagyl 12/15>>   - below per primary -  Post-operative respiratory insufficiency T2DM Physical deconditioning  Acute metabolic encephalopathy, improving  HTN HLD Obesity class I OSA Hypogonadism Moderate protein calorie  malnutrition  ADHD/Anxiety    LOS: 1 day     Norm Parcel, [facility] 03/02/2022, 8:41 AM Please see Amion for pager number during day hours 7:00am-4:30pm

## 2022-03-02 NOTE — Progress Notes (Signed)
Nurse called into room related to patient stated that he felt as though he could not breath put pulse ox read at 79-81% MD on call called. Patient placed on 2L oxygen  and patient pulse at 92%. Chest congestion heard on auscultation. MD at bed side. N.O. placed for chest x-ray and Mucinex for congestion. Patient able to cough and deep breath and instructed on regular incentive spirometry use. Head of bed elevated. Patient pulse ox holding steady at 92-94% on 2L [place]. Sanda Linger, RN

## 2022-03-02 NOTE — Progress Notes (Signed)
Patient ID: Richard [name], male   DOB: 1955-11-15, 66 y.o.   MRN: [id number]    Progress Note   Subjective   Day # 23 post op -diagnosis of Crohn's disease status post emergent exploratory lap/descending and sigmoid resection with transverse colostomy for sigmoid perforation 02/07/2022  Acute major GI bleed 02/25/2022-stool sigmoidoscopy at that time with multiple deep rectal ulcers several with stigmata of recent bleeding, treated with Hemospray and also noted nonbleeding rectal varices  Patient has not had any further active bleeding  Transferred to rehab yesterday  Continues on Solu-Medrol 40 mg IV daily Antibiotics narrowed to Rocephin and continues on IV Flagyl for management of Crohn's  Labs pending today 03/01/2022 WBC 7.3/hemoglobin 8.1/hematocrit 25.9 stable  Abscess drain continues to drain purulent material Ostomy with brown liquid stool    Patient is starting his rehab sessions this morning, up in wheelchair with therapist Says he feels very exhausted and he is very easily tired after being in bed for 3 weeks. Abdomen feels a little bloated, ostomy output is good he has not had any further output from his rectum or bleeding Eating without difficulty   Objective   Vital signs in last 24 hours: Temp:  [98.7 F (37.1 C)-98.9 F (37.2 C)] 98.7 F (37.1 C) (12/16 0443) Pulse Rate:  [71-110] 110 (12/16 0802) Resp:  [16-23] 16 (12/16 0443) BP: (118-124)/(67-81) 120/73 (12/16 0802) SpO2:  [92 %-94 %] 94 % (12/16 0443) Weight:  [119.9 kg] 119.9 kg (12/15 1432) Last BM Date : 03/01/22 General:    Older white male in NAD up in wheelchair, fatigued appearing Heart:  Regular rate and rhythm; no murmurs Lungs: Respirations even and unlabored, lungs CTA bilaterally Abdomen:  Soft, obese, no significant distention, bowel sounds are present, mild tenderness bilateral lower abdomen incision is dressed, ostomy intact, brown liquid stool, about 30 cc of purulent material in the  abscess drainage catheter Extremities:  Without edema. Neurologic:  Alert and oriented,  grossly normal neurologically. Psych:  Cooperative. Normal mood and affect.  Intake/Output from previous day: 12/15 0701 - 12/16 0700 In: 349.4 [P.O.:240; I.V.:9.4; IV Piggyback:100] Out: 720 [Urine:275; Drains:20; Stool:425] Intake/Output this shift: No intake/output data recorded.  Lab Results: Recent Labs    02/27/22 1730 02/28/22 0410 03/01/22 0447  WBC  --  5.4 7.3  HGB 8.2* 8.4* 8.1*  HCT 26.0* 26.6* 25.9*  PLT  --  223 262   BMET Recent Labs    02/28/22 0410 03/01/22 0447  NA 140 142  K 4.2 4.0  CL 101 103  CO2 30 30  GLUCOSE 275* 252*  BUN 11 16  CREATININE 0.37* 0.35*  CALCIUM 8.0* 8.0*   LFT Recent Labs    03/01/22 0447  PROT 5.8*  ALBUMIN 2.3*  AST 15  ALT 10  ALKPHOS 97  BILITOT 0.3   PT/INR No results for input(s): "LABPROT", "INR" in the last 72 hours.     Assessment / Plan:    #1 66 yo male now 3 weeks status post emergency exploratory lap/descending and sigmoid resection with transverse colostomy for sigmoid perforation, 02/07/2022.  This is in the setting of new diagnosis of IBD with colonoscopy done 4 days previous, findings favoring Crohn's. Patient had overt peritonitis at the time of surgery  Prolonged postop ileus resolved  #2 Acute major GI bleed 02/25/2022 from the rectum-finding of multiple deep rectal ulcerations several with stigmata of recent bleeding, treated with Hemospray, also incidental finding of rectal varices. No active bleeding since,  hemoglobin stable at 8.1  Started on Solu-Medrol 40 mg daily 02/27/2022 for management of severe rectal disease/Crohn's.  Also treated  with IV metronidazole-which was discontinued after development of a rash  #3 Left lower quadrant abscess status post IR drainage 02/26/2022 Continues to drain purulent material  Cultures growing E. Coli-and Bacteroides -antibiotics narrowed to Rocephin  #4  persistent peritoneal fluid status post paracentesis with removal of 750 cc of fluid on 02/26/2022, cultures are negative  Plan; continue above regimen Continue daily hemoglobin for now Transfuse to keep hemoglobin 8  Would like to do colonoscopy via the colostomy to reevaluate the right colon.  Will discuss timing with team, I am not sure that needs to be done this weekend.  Cirigliano is covering next week who did his initial colonoscopy.  Principal Problem:   Debility     LOS: 1 day   Talyia Allende PA-C  03/02/2022, 10:03 AM

## 2022-03-02 NOTE — Progress Notes (Addendum)
PROGRESS NOTE   Subjective/Complaints: He would ike to restart his home dose PRN methylphenidate '20mg'$  CR, helps with focus for ADHD. No additional concerns or complaints.   Review of Systems  Constitutional:  Negative for chills and fever.  HENT:  Negative for congestion.   Eyes:  Negative for double vision.  Respiratory:  Negative for shortness of breath.   Cardiovascular:  Negative for chest pain.  Gastrointestinal:  Negative for nausea and vomiting.  Genitourinary: Negative.   Psychiatric/Behavioral:  The patient is nervous/anxious.     Objective:   No results found. Recent Labs    02/28/22 0410 03/01/22 0447  WBC 5.4 7.3  HGB 8.4* 8.1*  HCT 26.6* 25.9*  PLT 223 262   Recent Labs    02/28/22 0410 03/01/22 0447  NA 140 142  K 4.2 4.0  CL 101 103  CO2 30 30  GLUCOSE 275* 252*  BUN 11 16  CREATININE 0.37* 0.35*  CALCIUM 8.0* 8.0*    Intake/Output Summary (Last 24 hours) at 03/02/2022 1200 Last data filed at 03/02/2022 0450 Gross per 24 hour  Intake 349.38 ml  Output 720 ml  Net -370.62 ml        Physical Exam: Vital Signs Blood pressure 120/73, pulse (!) 110, temperature 98.7 F (37.1 C), temperature source Oral, resp. rate 16, height '5\' 9"'$  (1.753 m), weight 119.9 kg, SpO2 94 %.   General: Alert and oriented x 3, No apparent distress HEENT: Head is normocephalic, atraumatic, PERRLA, EOMI, sclera anicteric, oral mucosa pink and moist Neck: Supple without JVD or lymphadenopathy Heart: Reg rate and rhythm. No murmurs rubs or gallops Chest: CTA bilaterally without wheezes, rales, or rhonchi; no distress Abdomen: Soft, mildly-tender, mildly-distended, bowel sounds positive.Midline incision with dressing intact. LLQ drain with minimal drainage. Colostomy with liquid stool.  Extremities: No clubbing, cyanosis, 2+ edema b/L LE Left > Right Psych: Pt's affect is appropriate. Pt is cooperative Skin: warm  and dry Neuro:  anxious  Musculoskeletal: Alert and oriented x 3. Normal insight and awareness. Intact Memory. Normal language and speech. Cranial nerve exam unremarkable. UE 5/5. LE 3+/5 prox to 4/5 distally. No focal sensory changes.   Assessment/Plan: 1. Functional deficits which require 3+ hours per day of interdisciplinary therapy in a comprehensive inpatient rehab setting. Physiatrist is providing close team supervision and 24 hour management of active medical problems listed below. Physiatrist and rehab team continue to assess barriers to discharge/monitor patient progress toward functional and medical goals  Care Tool:  Bathing              Bathing assist       Upper Body Dressing/Undressing Upper body dressing        Upper body assist      Lower Body Dressing/Undressing Lower body dressing            Lower body assist       Toileting Toileting    Toileting assist       Transfers Chair/bed transfer  Transfers assist           Locomotion Ambulation   Ambulation assist  Walk 10 feet activity   Assist           Walk 50 feet activity   Assist           Walk 150 feet activity   Assist           Walk 10 feet on uneven surface  activity   Assist           Wheelchair     Assist               Wheelchair 50 feet with 2 turns activity    Assist            Wheelchair 150 feet activity     Assist          Blood pressure 120/73, pulse (!) 110, temperature 98.7 F (37.1 C), temperature source Oral, resp. rate 16, height '5\' 9"'$  (1.753 m), weight 119.9 kg, SpO2 94 %.  Medical Problem List and Plan: 1. Functional deficits secondary to debility after acute peritonitis and multiple sequelae              -patient may not yet shower             -ELOS/Goals: 10-12 days, supervision  -Continue CIR, evaluations today 2.  Antithrombotics: -DVT/anticoagulation:  Mechanical:  Sequential compression devices, below knee Bilateral lower extremities as high risk for rebleeding.              -antiplatelet therapy: N/A 3. Pain Management: Oxycodone prn.  4. Mood/Behavior/Sleep: LCSW to follow for evaluation and support.             --trazodone prn for insomnia.              -antipsychotic agents: N/a 5. Neuropsych/cognition: This patient is capable of making decisions on his own behalf. 6. Skin/Wound Care: Pressure relief measures.              --wet to dry dressing changed to once a day (per patient request). -wound healing well. May be able to change to a transitional dressing soon.   7. Fluids/Electrolytes/Nutrition: Monitor I/O. Encourage intake.              --supplements with meals--change to ensure Max as BS trending up w/steroids on board             --Add beneprotein/juven to promote healing.  8. Colon perforation s/p resection/post op abscess: Transitioned to Ceftriaxone 12/15 and on Flagyl per Dr. Juleen China --continue IV antibiotics with drain in place.  --contact ID for input after drain removed.  -Surgery following appreciate assistance 9. Hypomagnesemia: Has required frequent IV supplementation. Recheck in am.  --Oral supplement held due to diarrhea? 10. Crohn's disease: Started on Solumedrol on 12/13 with plans for colonoscopy this weekend potentially.              --Reglan bid for abdominal distension. Fibercon for high output stool/diarrhea  -GI and surgery following appreciate assistance, GI planning colonoscopy  11. Tachycardia: Due to deconditioning. On low dose metoprolol due to orthostatic symptoms.              --monitor for symptoms with increase in activity.              --check orthostatic vitals.  12. ABLA:   -12/15 HGB stable at 8.1 13. T2DM: Hgb A1C- 6.6-->6.1. Was on metformin and Jardiance at home.              --Monitor BS ac/hs and  use SSI for elevated BS. Hold Jardiance due to infection.              --Start Semglee 15 units while on  steroids per Diabetes Coordinator recs?  -12/16 start 5 units HS 14. Severe OSA: Encourage CPAP use.   15. Fluid overload: Treated with IV lasix 12/10-12/13.  --Monitor I/O and daily weights.  Filed Weights   03/01/22 1432  Weight: 119.9 kg  -12/16 give '20mg'$  IV lasix today  16. ADHD   -restart methylphenidate, discussed dose/medication options with pharmacy   Late addendum CXR completed for congestion- bibasilar opacities , likely atelectasis Dicussed use of incentive spirometer Pt reports congestion is improved and his breathing is back to baseline, continue to monitior   LOS: 1 days A FACE TO FACE EVALUATION WAS PERFORMED  Jennye Boroughs 03/02/2022, 12:00 PM

## 2022-03-02 NOTE — Plan of Care (Signed)
  Problem: Consults Goal: RH GENERAL PATIENT EDUCATION Description: See Patient Education module for education specifics. Outcome: Progressing   Problem: RH BOWEL ELIMINATION Goal: RH STG MANAGE BOWEL WITH ASSISTANCE Description: STG Manage Bowel with min Assistance. Outcome: Progressing Goal: RH STG MANAGE BOWEL W/MEDICATION W/ASSISTANCE Description: STG Manage Bowel with Medication with min Assistance. Outcome: Progressing   Problem: RH BLADDER ELIMINATION Goal: RH STG MANAGE BLADDER WITH ASSISTANCE Description: STG Manage Bladder With min Assistance Outcome: Progressing Goal: RH STG MANAGE BLADDER WITH MEDICATION WITH ASSISTANCE Description: STG Manage Bladder With Medication With min Assistance. Outcome: Progressing Goal: RH STG MANAGE BLADDER WITH EQUIPMENT WITH ASSISTANCE Description: STG Manage Bladder With Equipment With min Assistance Outcome: Progressing   Problem: RH SKIN INTEGRITY Goal: RH STG SKIN FREE OF INFECTION/BREAKDOWN Description: Skin will be free of infection/breakdown with min assist Outcome: Progressing Goal: RH STG MAINTAIN SKIN INTEGRITY WITH ASSISTANCE Description: STG Maintain Skin Integrity With min Assistance. Outcome: Progressing Goal: RH STG ABLE TO PERFORM INCISION/WOUND CARE W/ASSISTANCE Description: STG Able To Perform Incision/Wound Care With min Assistance. Outcome: Progressing   Problem: RH SAFETY Goal: RH STG ADHERE TO SAFETY PRECAUTIONS W/ASSISTANCE/DEVICE Description: STG Adhere to Safety Precautions With min Assistance/Device. Outcome: Progressing   Problem: RH PAIN MANAGEMENT Goal: RH STG PAIN MANAGED AT OR BELOW PT'S PAIN GOAL Description: Pain will be managed at 3 out of 10 on pain scale with PRN medications min assist Outcome: Progressing   Problem: RH KNOWLEDGE DEFICIT GENERAL Goal: RH STG INCREASE KNOWLEDGE OF SELF CARE AFTER HOSPITALIZATION Description: Patient/caregiver will be able to managed medications, wound care and  ostomy care from nursing education and nursing handouts independently  Outcome: Progressing

## 2022-03-02 NOTE — Evaluation (Signed)
Occupational Therapy Assessment and Plan  Patient Details  Name: Richard [name] MRN: [id number] Date of Birth: December 25, 1955  OT Diagnosis: muscle weakness (generalized) Rehab Potential: Rehab Potential (ACUTE ONLY): [facility] ELOS: 3 weeks 2-3wks  Today's Date: 03/02/2022 OT Individual Time: 4098-1191, 1:45-2:00 OT Individual Time Calculation (min): 75 min   60 mins  Hospital Problem: Principal Problem:   Debility Active Problems:   Crohn's disease of large intestine with other complication ([place])   Past Medical History:  Past Medical History:  Diagnosis Date   Adult ADHD    Allergic rhinitis    Anxiety    claustrophobic   Bronchopneumonia 11/03/2013   Colon polyps 2012 and 2013   All hyperplastic except one adenomatous w/out high grade dysplasia (recall 2023 per Dr. Deatra Ina)   COVID-19 virus infection 09/29/2020   Elevated transaminase level 2017   Viral hep screening neg.  Suspect fatty liver.   Hyperlipidemia    Hypertension    Obesity    OSA on CPAP    Severe.  CPAP 10 cm H2O.  Follows Dr. Ander Slade.   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 [name] to do surg   Pneumonia    Seborrheic dermatitis of scalp    and face: hydrocort 2% cream bid prn to face, and fluocinonide 0.5% sol'n to scalp bid prn per Dr. Allyson Sabal (04/2014)   Secondary male hypogonadism 04/2021   Type 2 diabetes mellitus with microalbuminuria ([place]) DM dx-09/27/2015. Microalb 2020   Past Surgical History:  Past Surgical History:  Procedure Laterality Date   COLONOSCOPY W/ POLYPECTOMY  04/2010; 10/2011   Initial screening TCS showed 6 polyps (one adenomatous).  Pt says he returned for repeat TCS 10/2011 showed one hyperplastic polyp: Recall 10 yrs per Dr. Samuella Cota SIGMOIDOSCOPY N/A 02/25/2022   Procedure: FLEXIBLE SIGMOIDOSCOPY;  Surgeon: Irving Copas., MD;  Location: [facility];  Service: Gastroenterology;  Laterality: N/A;   HEMOSTASIS CONTROL  02/25/2022   Procedure:  HEMOSTASIS CONTROL;  Surgeon: Rush Landmark Telford Nab., MD;  Location: [facility];  Service: Gastroenterology;;   INCISION AND DRAINAGE ABSCESS N/A 11/07/2021   Procedure: Incision and drainage of scrotal abscess;  Surgeon: Leighton Ruff, MD;  Location: [facility];  Service: General;  Laterality: N/A;   LAPAROTOMY N/A 02/07/2022   Procedure: EXPLORATORY LAPAROTOMY, COLON RESECTION, OSTOMY;  Surgeon: Leighton Ruff, MD;  Location: [facility];  Service: General;  Laterality: N/A;   MOUTH SURGERY      Assessment & Plan Clinical Impression: [name] is a 66 year old male with history of T2DM, obesity, severe OSA, ADHD,  HTN, scrotal abscess 10/2021, recent diagnosis of Crohn's colitis after 3-4 weeks of diarrhea and started on prednisone 02/04/22. He was admitted on [facility] on 02/07/22 with abdominal pain, weakness, DOE, tachycardia and fever-T- 102 due to sepsis from perforated bowel  w/acute peritonitis.  CTA chest negative for PE. He was started on Vanc/Cefepime and  taken to OR emergently for Exp Lap with mobilization of splenic flexure, resection of descending and sigmoid colon and transverse colostomy by Dr. [name] the same day. NGT kept in place with high output, ongoing abdominal distension and low grade fevers. and ileus. Hospital course significant for metabolic alkalosis w/hypokalemia felt to be due to GI losses/hypovolemia and IVF/ KCL runs recommended by Dr. Jonnie Finner.  Pathology c/w IBD.  Therapy has been working with patient who continues to be limited by weakness, dizziness with standing in [place] as well as fatigue. CIR recommended due to functional decline  Patient transferred to CIR on 03/01/2022 .  Patient seen this AM for skilled OT eval, patient family present at the time of eval Patient complete UB BADL related task in simulated task in bathing UB at [facility] , he presented as MaxA to Dep with LB task performance.  The pt was able to donn a button up shirt with ModA and he was able to transfer from  supine to EOB with ModA.  The pt was able to brush his teeth EOB with s/u assist.  The pt returned to bed LOF with MaxA  and was able to turn from supine to rolling over from the left to the right with ModA.   (2) SkIled OT session:  The pt agreed to complete UB theraex using the 2lb dumb bell for 2 sets of 10 for bicep curls, shld flexion, and horizontal abduction with rest breaks as needed.   Patient currently requires max with basic self-care skills secondary to muscle weakness.  Prior to hospitalization, patient could complete BADL's with independent .  Patient will benefit from skilled intervention to decrease level of assist with basic self-care skills prior to discharge home with care partner.  Anticipate patient will require intermittent supervision and follow up home health.  OT - End of Session Activity Tolerance: Tolerates 30+ min activity with multiple rests Endurance Deficit: Yes OT Assessment Rehab Potential (ACUTE ONLY): Good OT Patient demonstrates impairments in the following area(s): Edema;Endurance;Balance;Motor;Behavior;Safety OT Basic ADL's Functional Problem(s): Eating;Grooming;Bathing;Dressing OT Advanced ADL's Functional Problem(s): Simple Meal Preparation OT Transfers Functional Problem(s): Tub/Shower;Toilet OT Additional Impairment(s): Fuctional Use of Upper Extremity (3-/5MMT BUE) OT Plan OT Frequency: 5 out of 7 days OT Duration/Estimated Length of Stay: 3 weeks OT Self Feeding Anticipated Outcome(s): s/u OT Basic Self-Care Anticipated Outcome(s): ModI OT Bathroom Transfers Anticipated Outcome(s): ModI OT Recommendation Patient destination: Home Follow Up Recommendations: Home health OT Equipment Details: Further assessment to be determined   OT Evaluation Precautions/Restrictions   Fall risk, Edema, anxious, colostomy, and wound care  General   Vital Signs Therapy Vitals Temp: 98.9 F (37.2 C) Temp Source: Oral Pulse Rate: 98 Resp: 19 BP: (!)  154/88 Patient Position (if appropriate): Lying Oxygen Therapy SpO2: 91 % O2 Device: Nasal Cannula Pain   Home Living/Prior Functioning Home Living Family/patient expects to be discharged to:: Private residence Living Arrangements: Spouse/significant other Available Help at Discharge: Family Type of Home: House Home Access: Stairs to enter (5 stairs in the entrance of the garage, which is the location of the main living area.) Entrance Stairs-Number of Steps: 5 Entrance Stairs-Rails: Left Home Layout: Two level Alternate Level Stairs-Number of Steps: 15 down to his  office Alternate Level Stairs-Rails: Left Bathroom Shower/Tub: Multimedia programmer: Associate Professor Accessibility: Yes  Lives With: Spouse IADL History Homemaking Responsibilities: Yes Meal Prep Responsibility: Secondary Current License: Yes 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 Leisure and Hobbies: Land Prior Function Level of Independence: Independent with basic ADLs, Independent with homemaking with ambulation, Independent with transfers Vision  Patient able to track and Nurse, children's Overall Cognitive Status: Within Functional Limits for tasks assessed Orientation Level: Person;Place;Situation Person: Oriented Place: Oriented Situation: Oriented Brief Interview for Mental Status (BIMS) Repetition of Three Words (First Attempt): 3 Temporal Orientation: Year: Correct Temporal Orientation: Month: Accurate within 5 days Temporal Orientation: Day: Correct Recall: "Sock": Yes, no cue required Recall: "Blue": Yes, no cue required Recall: "Bed": Yes, no cue required  BIMS Summary Score: 15 Sensation  Intact with vision occluded Motor  Challenges with BLE/UE secondary to generalize weakness and edema associated with BLE   Trunk/Postural Assessment    Challenges with trunk strength Balance   Patient presents with poor activity tolerance at time of eval  Extremity/Trunk Assessment    Patient able to maintain static and dynamic sit balance at EOB during assessment.    Care Tool Care Tool Self Care Eating   Eating Assist Level: Set up assist (at bed or chair level based on observation)    Oral Care    Oral Care Assist Level: Set up assist (performed at EOB, seated unsupported)    Bathing   Body parts bathed by patient: Right arm;Left arm;Chest;Abdomen;Face (simulated task at bed level) Body parts bathed by helper: Front perineal area;Left upper leg;Right upper leg;Right lower leg;Left lower leg;Buttocks   Assist Level: Dependent - Patient 0% (Patient presents as dependent with LB functional seated EOB)    Upper Body Dressing(including orthotics)   What is the patient wearing?: Button up shirt   Assist Level: Moderate Assistance - Patient 50 - 74%    Lower Body Dressing (excluding footwear)   What is the patient wearing?: Pants Assist for lower body dressing: Dependent - Patient 0%    Putting on/Taking off footwear   What is the patient wearing?: Non-skid slipper socks Assist for footwear: Dependent - Patient 0%       Care Tool Toileting Toileting activity         Care Tool Bed Mobility Roll left and right activity   Roll left and right assist level: Moderate Assistance - Patient 50 - 74%    Sit to lying activity   Sit to lying assist level: Moderate Assistance - Patient 50 - 74%    Lying to sitting on side of bed activity   Lying to sitting on side of bed assist level: the ability to move from lying on the back to sitting on the side of the bed with no back support.: Maximal Assistance - Patient 25 - 49%     Care Tool Transfers Sit to stand transfer   Sit to stand assist level: 2 Helpers (In [place] from elevated bed)    Chair/bed transfer   Chair/bed transfer assist level: 2 Helpers (and [place])     Animator  Expression of Ideas and Wants    Understanding Verbal and Non-Verbal Content     Memory/Recall Ability     Refer to Care Plan for Long Term Goals  SHORT TERM GOAL WEEK 1 OT Short Term Goal 1 (Week 1): The pt will complete UB BADL related task in dressing with MinA OT Short Term Goal 2 (Week 1): The pt will complete BADL related task in dressing LB with ModA using AE for >ease at 95% safe OT Short Term Goal 3 (Week 1): The pt will complete functional transfers using the stedy x1 coming from sit to stand with MinA  at 95% safe OT Short Term Goal 4 (Week 1): The pt will tolerate >30 minutes of activity, with rest breaks as needed at 95%safe OT Short Term Goal 5 (Week 1): The pt will remain at up in the chair for 1 hour to improve endurance at 95% safe  Recommendations for other services: None    Skilled Therapeutic Intervention Patient seen this date for skilled OT evaluation, family present at the time of eval.  Patient had no report of pain this treatment session. Patient was able to complete a simulated task in bathing and dressing at bed LOF, Patient was able to bathe his UB with s/u assist, he was  Dep with LB.  The pt was able to dress his UB with a button up shirt with ModA and Dep for LB donning of clothing items, short and socks.  The pt presented with poor activity tolerance, with anxious tendencies.  The pt was able to demonstrate fair bed mobility, using the bed rails for pulling himself over, he was able to come from supine to EOB with ModA , but refused standing using the stedy.  The pt was able to brush his teeth at the EOB with s/u assist and he returned ot bed with Mod/MaxA for managing his legs with verbal cues.    (2) OT session:  The patient complete UB exercise using the dowel rod for various planes 1 set of 10 with rest breaks as needed, the pt required 1 rest break between activities.  The pt was able to complete pull-ups at bed level to improve his activity  mobility for transfer from bed LOF to EOB.  The pt was instructed to maintain position by grasping the bed rail and maintaining position 4x for a count of 10.  The pt went on to maintaining sit balance using using the bed rail and removing suction pegs for a table with breaks as needed, the pt require 3 rest breaks.  The pt remained at bed LOF with his call light and bed side table within reach and his alarm activiated all additional needs were addressed.  ADL ADL Equipment Provided:  (Patient would benefit form AE of long handle sponge for BLE) Eating: Set up (based on observation) Grooming: Setup Where Assessed-Grooming: Edge of bed Upper Body Bathing: Setup Where Assessed-Upper Body Bathing: Bed level Lower Body Bathing: Maximal assistance Where Assessed-Lower Body Bathing: Bed level (based on current functional status) Upper Body Dressing: Moderate assistance Where Assessed-Upper Body Dressing: Bed level Lower Body Dressing: Maximal assistance Where Assessed-Lower Body Dressing: Bed level Toileting: Maximal assistance Where Assessed-Toileting:  (based on observation of LB function.) Toilet Transfer: Dependent Toilet Transfer Method: Other (comment) (dependent on mecanical device Stedyx2) Science writer:  Investment banker, corporate) Tub/Shower Transfer: Dependent Tub/Shower Transfer Method:  (Stedyx 2 for transfer) Insurance underwriter:  (stedyx2) Social research officer, government Method:  Charlaine Dalton x1) Walk-In Engineer, site: Civil engineer, contracting without back Mobility  Bed Mobility Bed Mobility: Rolling Right;Rolling Left (Short period  2-3 minutes)   Discharge Criteria: Patient will be discharged from OT if patient refuses treatment 3 consecutive times without medical reason, if treatment goals not met, if there is a change in medical status, if patient makes no progress towards goals or if patient is discharged from hospital.  The above assessment, treatment plan, treatment alternatives and goals were  discussed and mutually agreed upon: by patient  Yvonne Kendall 03/02/2022, 5:32 PM

## 2022-03-02 NOTE — Evaluation (Signed)
Physical Therapy Assessment and Plan  Patient Details  Name: Richard [name] MRN: [id number] Date of Birth: 1955/12/25  PT Diagnosis: Muscle weakness and Pain in abdomen Rehab Potential: Fair ELOS: 3 weeks   Today's Date: 03/02/2022 PT Individual Time: 5993-5701 PT Individual Time Calculation (min): 72 min    Hospital Problem: Principal Problem:   Debility   Past Medical History:  Past Medical History:  Diagnosis Date   Adult ADHD    Allergic rhinitis    Anxiety    claustrophobic   Bronchopneumonia 11/03/2013   Colon polyps 2012 and 2013   All hyperplastic except one adenomatous w/out high grade dysplasia (recall 2023 per Dr. Deatra Ina)   COVID-19 virus infection 09/29/2020   Elevated transaminase level 2017   Viral hep screening neg.  Suspect fatty liver.   Hyperlipidemia    Hypertension    Obesity    OSA on CPAP    Severe.  CPAP 10 cm H2O.  Follows Dr. Ander Slade.   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 [name] to do surg   Pneumonia    Seborrheic dermatitis of scalp    and face: hydrocort 2% cream bid prn to face, and fluocinonide 0.5% sol'n to scalp bid prn per Dr. Allyson Sabal (04/2014)   Secondary male hypogonadism 04/2021   Type 2 diabetes mellitus with microalbuminuria ([place]) DM dx-09/27/2015. Microalb 2020   Past Surgical History:  Past Surgical History:  Procedure Laterality Date   COLONOSCOPY W/ POLYPECTOMY  04/2010; 10/2011   Initial screening TCS showed 6 polyps (one adenomatous).  Pt says he returned for repeat TCS 10/2011 showed one hyperplastic polyp: Recall 10 yrs per Dr. Samuella Cota SIGMOIDOSCOPY N/A 02/25/2022   Procedure: FLEXIBLE SIGMOIDOSCOPY;  Surgeon: Irving Copas., MD;  Location: [facility];  Service: Gastroenterology;  Laterality: N/A;   HEMOSTASIS CONTROL  02/25/2022   Procedure: HEMOSTASIS CONTROL;  Surgeon: Rush Landmark Telford Nab., MD;  Location: [facility];  Service: Gastroenterology;;   INCISION AND DRAINAGE ABSCESS  N/A 11/07/2021   Procedure: Incision and drainage of scrotal abscess;  Surgeon: Leighton Ruff, MD;  Location: [facility];  Service: General;  Laterality: N/A;   LAPAROTOMY N/A 02/07/2022   Procedure: EXPLORATORY LAPAROTOMY, COLON RESECTION, OSTOMY;  Surgeon: Leighton Ruff, MD;  Location: [facility];  Service: General;  Laterality: N/A;   MOUTH SURGERY      Assessment & Plan Clinical Impression:HPI:  Richard [name] is a 66 year old male with history of T2DM, obesity, severe OSA, ADHD,  HTN, scrotal abscess 10/2021, recent diagnosis of Crohn's colitis after 3-4 weeks of diarrhea and started on prednisone 02/04/22. He was admitted on [facility] on 02/07/22 with abdominal pain, weakness, DOE, tachycardia and fever-T- 102 due to sepsis from perforated bowel  w/acute peritonitis.  CTA chest negative for PE. He was started on Vanc/Cefepime and  taken to OR emergently for Exp Lap with mobilization of splenic flexure, resection of descending and sigmoid colon and transverse colostomy by Dr. [name] the same day. NGT kept in place with high output, ongoing abdominal distension and low grade fevers. and ileus. Hospital course significant for metabolic alkalosis w/hypokalemia felt to be due to GI losses/hypovolemia and IVF/ KCL runs recommended by Dr. Jonnie Finner.  Pathology c/w IBD.    He also had issues with confusion w/ worsening of symptoms and antibiotics changed to Meropenum/Vanc on 12/01 due to concerns of PNA. CT abdomen/pelvis done and showed stable free fluid with increase in mildly dilated SB loops likely due to post op  ileus, no abscess and right sided pleural effusion. He was started on TNA for nutritional support and continued to have fevers with tachycardia felt to be due to underlying IBD and GI consulted for input on starting steroids but expressed concerns due to pending infectious workup  but Reglan added to help manage nausea. NGT removed on 12/05 and he was started on liquid diet, weaned off TPN and diet advanced  to regular by 12/14.   He has refused to use his CPAP since NGT removal. He has had issues with increase in peripheral edema treated with dose of lasix, high volume output via colostomy-->fibercon added as well as drop in Hgb requiring transfusion and developed BRBPR on 12/11 requiring 4 units PRBC. Lovenox d/c. CTA abdomen revealed acute extravasation from rectum and 10 cm rim enhancing fluid collection above staple line in LLQ. He underwent colonoscopy by Dr. Everardo Pacific which revealed blood in rectum, rectal varices and multiple ulcers in mid and distal rectum felt to source of bleeding and deeply crated mid rectal ulcer tx with PuraStat hemostatic gel. Recommendations for no chemical VTE PPx for 48-72 hours. Patient also underwent CT guided paracentesis with LLQ drain placement in LLQ abscess by Dr. Dwaine Gale on 12/12.    LUE duplex done due to edema and technically difficult study but did not show evidence of DVT and trail of compression used? He was started on solumedrol 12/13 and GI plans for ostomy evaluation this weekend and imaging next week to follow post abscess drainage and eventual consideration of Remicade infusions.  He did develop a rash 12/14 felt to be due Zosyn which briefly discontinued and Flagyl added however abdominal culture 12/12 grew E coli and Bacteriodes Thetaitoamicron and recommendations by Dr. Juleen China were to transition to IV ceftriaxone and Flagyl while LLQ drain remains in place and then to decided on d/c of antibiotics v/s transition to oral antibiotics.  Rash reported to be resolving and intake improving. WOC has been working with patient/wife on ostomy care. Therapy has been working with patient who continues to be limited by weakness, dizziness with standing in [place] as well as fatigue. CIR recommended due to functional decline.       Patient transferred to CIR on 03/01/2022 .   Patient currently requires  max of 2  with mobility secondary to muscle weakness, decreased  cardiorespiratoy endurance, and decreased standing balance and decreased postural control.  Prior to hospitalization, patient was independent  with mobility and lived with Spouse in a House home.  Home access is 5Stairs to enter (5 stairs in the entrance of the garage, which is the location of the main living area.).  Patient will benefit from skilled PT intervention to maximize safe functional mobility, minimize fall risk, and decrease caregiver burden for planned discharge home with 24 hour assist.  Anticipate patient will benefit from follow up [facility] at discharge.  PT - End of Session Activity Tolerance: Decreased this session Endurance Deficit: Yes PT Assessment Rehab Potential (ACUTE/IP ONLY): Fair PT Barriers to Discharge: Inaccessible home environment;Home environment access/layout;Wound Care;Weight;Pending surgery;Behavior;Other (comments) PT Barriers to Discharge Comments: pt w/limited motivation, low activity tolerance PT Patient demonstrates impairments in the following area(s): Balance;Skin Integrity;Behavior;Edema;Endurance;Nutrition;Pain;Sensory PT Transfers Functional Problem(s): Bed Mobility;Bed to Chair;Car PT Locomotion Functional Problem(s): Ambulation;Wheelchair Mobility PT Plan PT Intensity: Minimum of 1-2 x/day ,45 to 90 minutes PT Frequency: 5 out of 7 days PT Duration Estimated Length of Stay: 3 weeks PT Treatment/Interventions: Ambulation/gait training;Discharge planning;Functional mobility training;Psychosocial support;Therapeutic Activities;Balance/vestibular training;Disease management/prevention;Neuromuscular re-education;Skin care/wound  management;Therapeutic Exercise;Wheelchair propulsion/positioning;DME/adaptive equipment instruction;Pain management;UE/LE Strength taining/ROM;Patient/family education;Stair training;UE/LE Coordination activities PT Transfers Anticipated Outcome(s): min PT Locomotion Anticipated Outcome(s): min PT Recommendation Follow Up  Recommendations: Home health PT Patient destination: Home Equipment Recommended: To be determined   PT Evaluation Precautions/Restrictions Precautions Precautions: Other (comment) (pt  is extremly anxious) Precaution Comments: L colostomy, monitor o2/HR Other Brace: binder was not in place Restrictions Weight Bearing Restrictions: No General   Vital SignsTherapy Vitals Pulse Rate: (!) 110 BP: 120/73 Oxygen Therapy SpO2: 94 % O2 Device: Room Air Patient Activity (if Appropriate): In bed Pain Pain Assessment Pain Scale: 0-10 Pain Score: 0-No pain Pain Location: Abdomen Pain Interference Pain Interference Pain Effect on Sleep: 1. Rarely or not at all Pain Interference with Therapy Activities: 2. Occasionally Pain Interference with Day-to-Day Activities: 2. Occasionally Home Living/Prior Functioning Home Living Available Help at Discharge: Family Type of Home: House Home Access: Stairs to enter (5 stairs in the entrance of the garage, which is the location of the main living area.) Entrance Stairs-Number of Steps: 5 Entrance Stairs-Rails: Left Home Layout: Two level Alternate Level Stairs-Number of Steps: 15 down to his  office Alternate Level Stairs-Rails: Left Bathroom Shower/Tub: Multimedia programmer: Standard Bathroom Accessibility: Yes  Lives With: Spouse Prior Function Level of Independence: Independent with basic ADLs;Independent with homemaking with ambulation;Independent with transfers Vision/Perception  Perception Perception: Within Functional Limits  Cognition Overall Cognitive Status: Within Functional Limits for tasks assessed Orientation Level: Oriented X4 Sensation Sensation Light Touch: Appears Intact Coordination Gross Motor Movements are Fluid and Coordinated: No Fine Motor Movements are Fluid and Coordinated: Yes Finger Nose Finger Test: mild tremor RUE Heel Shin Test: unable due to weakness, lack of effort5 Motor       Trunk/Postural Assessment  Cervical Assessment Cervical Assessment: Within Functional Limits Thoracic Assessment Thoracic Assessment: Within Functional Limits  Balance   Extremity Assessment  RUE Assessment Active Range of Motion (AROM) Comments: full General Strength Comments: 3-/5 MMT LUE Assessment LUE Assessment: Within Functional Limits Active Range of Motion (AROM) Comments: full range General Strength Comments: 3/5MMT RLE Assessment RLE Assessment: Exceptions to [facility] Passive Range of Motion (PROM) Comments: wfls Active Range of Motion (AROM) Comments: limited by weakness General Strength Comments: hip flexion 2/5, abd/add 2/5, quads 4-, hams 4-, DF 4- LLE Assessment General Strength Comments: hip flex 2/5, abd/add 2/5, quad 4-, hams 3+, DF 4-  Care Tool Care Tool Bed Mobility Roll left and right activity   Roll left and right assist level: Moderate Assistance - Patient 50 - 74%    Sit to lying activity   Sit to lying assist level: Moderate Assistance - Patient 50 - 74%    Lying to sitting on side of bed activity   Lying to sitting on side of bed assist level: the ability to move from lying on the back to sitting on the side of the bed with no back support.: Maximal Assistance - Patient 25 - 49%     Care Tool Transfers Sit to stand transfer Sit to stand activity did not occur: Refused Sit to stand assist level: 2 Helpers (In [place] from elevated bed)    Chair/bed transfer Chair/bed transfer activity did not occur: Refused (patient requires mecanical assistance using the stedy) Chair/bed transfer assist level: 2 Helpers (and [place])     Physiological scientist transfer assist level: Dependent - Patient 0%      Care Tool Locomotion Ambulation  Ambulation activity did not occur: Safety/medical concerns        Walk 10 feet activity Walk 10 feet activity did not occur: Safety/medical concerns       Walk 50 feet with 2 turns activity Walk 50  feet with 2 turns activity did not occur: Safety/medical concerns      Walk 150 feet activity Walk 150 feet activity did not occur: Safety/medical concerns      Walk 10 feet on uneven surfaces activity Walk 10 feet on uneven surfaces activity did not occur: Safety/medical concerns      Stairs Stair activity did not occur: Safety/medical concerns        Walk up/down 1 step activity Walk up/down 1 step or curb (drop down) activity did not occur: Safety/medical concerns      Walk up/down 4 steps activity Walk up/down 4 steps activity did not occur: Safety/medical concerns      Walk up/down 12 steps activity Walk up/down 12 steps activity did not occur: Safety/medical concerns      Pick up small objects from floor Pick up small object from the floor (from standing position) activity did not occur: Safety/medical concerns      Wheelchair Is the patient using a wheelchair?: Yes Type of Wheelchair: Manual   Wheelchair assist level: Dependent - Patient 0%    Wheel 50 feet with 2 turns activity   Assist Level: Dependent - Patient 0%  Wheel 150 feet activity   Assist Level: Dependent - Patient 0%    Refer to Care Plan for Long Term Goals  SHORT TERM GOAL WEEK 1 PT Short Term Goal 1 (Week 1): pt will perform supine to/from sit w/mod assist of 1 PT Short Term Goal 2 (Week 1): pt will perform sit to stand from elevated surface w/mod assist of 2 PT Short Term Goal 3 (Week 1): pt will propel wc 58f w/mod assist PT Short Term Goal 4 (Week 1): Pt will transfer bed to/from wc w/LRAD and mod assist of 2  Recommendations for other services: None   Skilled Therapeutic Intervention Mobility Bed Mobility Bed Mobility: Rolling Right;Rolling Left (Short period  2-3 minutes) Transfers Transfers:  (([id number]  Therex: Kinetron from wc 1 min intervals x 3, HR 115, 02 sats 88% on RA.  Evaluation completed (see details above and below) with education on PT POC and goals and individual  treatment initiated with focus on functional mobility/transfers, LE strength, dynamic standing balance/coordination, and improved endurance with activity Pt was provided w/TIS wc and roho hybrid cushion to promote tolerance for OOB, pressure relief and wc was adjusted to pt fit.  Transfer training, standing in stedy, orientation to unit, therex all performed.  Discharge Criteria: Patient will be discharged from PT if patient refuses treatment 3 consecutive times without medical reason, if treatment goals not met, if there is a change in medical status, if patient makes no progress towards goals or if patient is discharged from hospital.  The above assessment, treatment plan, treatment alternatives and goals were discussed and mutually agreed upon: by patient  Richard Cairo12/16/2023, 12:44 PM

## 2022-03-03 ENCOUNTER — Inpatient Hospital Stay (HOSPITAL_COMMUNITY): Payer: 59

## 2022-03-03 DIAGNOSIS — K50118 Crohn's disease of large intestine with other complication: Secondary | ICD-10-CM | POA: Diagnosis not present

## 2022-03-03 DIAGNOSIS — E1165 Type 2 diabetes mellitus with hyperglycemia: Secondary | ICD-10-CM | POA: Diagnosis not present

## 2022-03-03 DIAGNOSIS — D62 Acute posthemorrhagic anemia: Secondary | ICD-10-CM | POA: Diagnosis not present

## 2022-03-03 DIAGNOSIS — R5381 Other malaise: Secondary | ICD-10-CM | POA: Diagnosis not present

## 2022-03-03 LAB — CBC WITH DIFFERENTIAL/PLATELET
Abs Immature Granulocytes: 0.58 10*3/uL — ABNORMAL HIGH (ref 0.00–0.07)
Basophils Absolute: 0.1 10*3/uL (ref 0.0–0.1)
Basophils Relative: 0 %
Eosinophils Absolute: 0.2 10*3/uL (ref 0.0–0.5)
Eosinophils Relative: 1 %
HCT: 32.7 % — ABNORMAL LOW (ref 39.0–52.0)
Hemoglobin: 10.4 g/dL — ABNORMAL LOW (ref 13.0–17.0)
Immature Granulocytes: 4 %
Lymphocytes Relative: 6 %
Lymphs Abs: 0.8 10*3/uL (ref 0.7–4.0)
MCH: 31 pg (ref 26.0–34.0)
MCHC: 31.8 g/dL (ref 30.0–36.0)
MCV: 97.6 fL (ref 80.0–100.0)
Monocytes Absolute: 0.4 10*3/uL (ref 0.1–1.0)
Monocytes Relative: 3 %
Neutro Abs: 11.4 10*3/uL — ABNORMAL HIGH (ref 1.7–7.7)
Neutrophils Relative %: 86 %
Platelets: 359 10*3/uL (ref 150–400)
RBC: 3.35 MIL/uL — ABNORMAL LOW (ref 4.22–5.81)
RDW: 15.6 % — ABNORMAL HIGH (ref 11.5–15.5)
WBC: 13.4 10*3/uL — ABNORMAL HIGH (ref 4.0–10.5)
nRBC: 0.4 % — ABNORMAL HIGH (ref 0.0–0.2)

## 2022-03-03 LAB — COMPREHENSIVE METABOLIC PANEL
ALT: 29 U/L (ref 0–44)
AST: 47 U/L — ABNORMAL HIGH (ref 15–41)
Albumin: 2.3 g/dL — ABNORMAL LOW (ref 3.5–5.0)
Alkaline Phosphatase: 153 U/L — ABNORMAL HIGH (ref 38–126)
Anion gap: 8 (ref 5–15)
BUN: 21 mg/dL (ref 8–23)
CO2: 28 mmol/L (ref 22–32)
Calcium: 8.5 mg/dL — ABNORMAL LOW (ref 8.9–10.3)
Chloride: 101 mmol/L (ref 98–111)
Creatinine, Ser: 0.57 mg/dL — ABNORMAL LOW (ref 0.61–1.24)
GFR, Estimated: >60 mL/min (ref >60)
Glucose, Bld: 358 mg/dL — ABNORMAL HIGH (ref 70–99)
Potassium: 4.2 mmol/L (ref 3.5–5.1)
Sodium: 137 mmol/L (ref 135–145)
Total Bilirubin: 0.5 mg/dL (ref 0.3–1.2)
Total Protein: 5.8 g/dL — ABNORMAL LOW (ref 6.5–8.1)

## 2022-03-03 LAB — GLUCOSE, CAPILLARY
Glucose-Capillary: 185 mg/dL — ABNORMAL HIGH (ref 70–99)
Glucose-Capillary: 201 mg/dL — ABNORMAL HIGH (ref 70–99)
Glucose-Capillary: 316 mg/dL — ABNORMAL HIGH (ref 70–99)
Glucose-Capillary: 306 mg/dL — ABNORMAL HIGH (ref 70–99)

## 2022-03-03 LAB — BRAIN NATRIURETIC PEPTIDE: B Natriuretic Peptide: 152.8 pg/mL — ABNORMAL HIGH (ref 0.0–100.0)

## 2022-03-03 MED ORDER — FUROSEMIDE 10 MG/ML IJ SOLN
40.0000 mg | Freq: Once | INTRAMUSCULAR | Status: AC
  Administered 2022-03-04: 40 mg via INTRAVENOUS
  Filled 2022-03-03: qty 4

## 2022-03-03 MED ORDER — INSULIN GLARGINE-YFGN 100 UNIT/ML ~~LOC~~ SOLN
8.0000 [IU] | Freq: Every day | SUBCUTANEOUS | Status: DC
  Administered 2022-03-03: 8 [IU] via SUBCUTANEOUS
  Filled 2022-03-03 (×2): qty 0.08

## 2022-03-03 NOTE — Plan of Care (Signed)
  Problem: Consults Goal: RH GENERAL PATIENT EDUCATION Description: See Patient Education module for education specifics. Outcome: Progressing   Problem: RH BOWEL ELIMINATION Goal: RH STG MANAGE BOWEL WITH ASSISTANCE Description: STG Manage Bowel with min Assistance. Outcome: Progressing Goal: RH STG MANAGE BOWEL W/MEDICATION W/ASSISTANCE Description: STG Manage Bowel with Medication with min Assistance. Outcome: Progressing   Problem: RH BLADDER ELIMINATION Goal: RH STG MANAGE BLADDER WITH ASSISTANCE Description: STG Manage Bladder With min Assistance Outcome: Progressing Goal: RH STG MANAGE BLADDER WITH MEDICATION WITH ASSISTANCE Description: STG Manage Bladder With Medication With min Assistance. Outcome: Progressing Goal: RH STG MANAGE BLADDER WITH EQUIPMENT WITH ASSISTANCE Description: STG Manage Bladder With Equipment With min Assistance Outcome: Progressing   Problem: RH SKIN INTEGRITY Goal: RH STG SKIN FREE OF INFECTION/BREAKDOWN Description: Skin will be free of infection/breakdown with min assist Outcome: Progressing Goal: RH STG MAINTAIN SKIN INTEGRITY WITH ASSISTANCE Description: STG Maintain Skin Integrity With min Assistance. Outcome: Progressing Goal: RH STG ABLE TO PERFORM INCISION/WOUND CARE W/ASSISTANCE Description: STG Able To Perform Incision/Wound Care With min Assistance. Outcome: Progressing   Problem: RH SAFETY Goal: RH STG ADHERE TO SAFETY PRECAUTIONS W/ASSISTANCE/DEVICE Description: STG Adhere to Safety Precautions With min Assistance/Device. Outcome: Progressing   Problem: RH PAIN MANAGEMENT Goal: RH STG PAIN MANAGED AT OR BELOW PT'S PAIN GOAL Description: Pain will be managed at 3 out of 10 on pain scale with PRN medications min assist Outcome: Progressing   Problem: RH KNOWLEDGE DEFICIT GENERAL Goal: RH STG INCREASE KNOWLEDGE OF SELF CARE AFTER HOSPITALIZATION Description: Patient/caregiver will be able to managed medications, wound care and  ostomy care from nursing education and nursing handouts independently  Outcome: Progressing

## 2022-03-03 NOTE — Progress Notes (Signed)
Patient ID: Richard [name], male   DOB: 1955-09-27, 66 y.o.   MRN: [id number]  Brief GI Note; see complete note from earlier today  X-ray today to review-bilateral pleural effusions with moderate layering bilateral effusions and pulmonary interstitial prominence with vascular congestion suggestive of congestive heart failure  Today WBC is up to 13.4/hemoglobin 10.4  Low Grade temp in the 99 range  Plan; will proceed with CT of the abdomen and pelvis with contrast today Continue Rocephin and IV Flagyl I spoke with the rehab physician regarding large effusions and findings consistent with congestive heart failure.  Patient needs to be seen by [facility] hospitalist or cardiology. Dr.Shtrielman will contact cardiology for consultation today  C-Met, BNP  I discussed the findings with the patient and his wife  Would have low  threshold for transferring patient back to the inpatient setting.

## 2022-03-03 NOTE — Progress Notes (Signed)
Pt has home CPAP machine. Pt stated he doesn't wear it while he is in the hospital. No distress noted

## 2022-03-03 NOTE — Progress Notes (Addendum)
PROGRESS NOTE   Subjective/Complaints: He reports breathing is much better today. Advised him to bring his home methylphenidate medication. Plan for colonoscopy tomorrow per the GI team.   Review of Systems  Constitutional:  Negative for chills and fever.  HENT:  Positive for congestion.   Eyes:  Negative for double vision.  Respiratory:  Negative for shortness of breath.   Cardiovascular:  Negative for chest pain.  Gastrointestinal:  Negative for nausea and vomiting.  Genitourinary: Negative.   Neurological:  Positive for weakness.  Psychiatric/Behavioral:  The patient is nervous/anxious.     Objective:   DG CHEST PORT 1 VIEW  Result Date: 03/02/2022 CLINICAL DATA:  Cough EXAM: PORTABLE CHEST 1 VIEW COMPARISON:  Chest x-ray February 11, 2022 FINDINGS: Right upper extremity PICC terminates in the mid SVC. Unchanged cardiomegaly. Stable mediastinal contours. Bibasilar bandlike opacities. Small right pleural effusion. No large pneumothorax. The visualized upper abdomen is unremarkable. No acute osseous abnormality. IMPRESSION: 1. Bibasilar bandlike opacities, likely atelectasis but aspiration/infection could appear similarly in the appropriate clinical context. 2. Small right pleural effusion. Electronically Signed   By: Beryle Flock M.D.   On: 03/02/2022 18:32   Recent Labs    03/01/22 0447  WBC 7.3  HGB 8.1*  HCT 25.9*  PLT 262    Recent Labs    03/01/22 0447  NA 142  K 4.0  CL 103  CO2 30  GLUCOSE 252*  BUN 16  CREATININE 0.35*  CALCIUM 8.0*     Intake/Output Summary (Last 24 hours) at 03/03/2022 0817 Last data filed at 03/03/2022 0700 Gross per 24 hour  Intake 386.63 ml  Output 2825 ml  Net -2438.37 ml         Physical Exam: Vital Signs Blood pressure 129/82, pulse 100, temperature 99.1 F (37.3 C), temperature source Oral, resp. rate 18, height '5\' 9"'$  (1.753 m), weight 119.9 kg, SpO2 96  %.   General: Alert and oriented x 3, No apparent distress HEENT: Head is normocephalic, atraumatic, PERRLA, EOMI, sclera anicteric, oral mucosa pink and moist Neck: Supple without JVD or lymphadenopathy Heart: Reg rate and rhythm. No murmurs rubs or gallops Chest: CTA bilaterally without wheezes, rales, or rhonchi; no distress Abdomen: Soft, mildly-tender, mildly-distended, bowel sounds positive.Midline incision with dressing intact. LLQ drain with small amount of purulent material. Colostomy with liquid stool.  Extremities: No clubbing, cyanosis, + LE edema Psych: Pt's affect is appropriate. Pt is cooperative Skin: warm and dry Neuro:  anxious  Musculoskeletal: Alert and oriented x 3. Normal insight and awareness. Intact Memory. Normal language and speech. Cranial nerve exam unremarkable. UE 5/5. LE 3+/5 prox to 4/5 distally. No focal sensory changes.   Assessment/Plan: 1. Functional deficits which require 3+ hours per day of interdisciplinary therapy in a comprehensive inpatient rehab setting. Physiatrist is providing close team supervision and 24 hour management of active medical problems listed below. Physiatrist and rehab team continue to assess barriers to discharge/monitor patient progress toward functional and medical goals  Care Tool:  Bathing    Body parts bathed by patient: Right arm, Left arm, Chest, Abdomen, Face (simulated task at bed level)   Body parts bathed by helper: Front  perineal area, Left upper leg, Right upper leg, Right lower leg, Left lower leg, Buttocks     Bathing assist Assist Level: Dependent - Patient 0% (Patient presents as dependent with LB functional seated EOB)     Upper Body Dressing/Undressing Upper body dressing   What is the patient wearing?: Button up shirt    Upper body assist Assist Level: Moderate Assistance - Patient 50 - 74%    Lower Body Dressing/Undressing Lower body dressing      What is the patient wearing?: Pants      Lower body assist Assist for lower body dressing: Dependent - Patient 0%     Toileting Toileting    Toileting assist Assist for toileting: Dependent - Patient 0% (cather and colosteomy bag)     Transfers Chair/bed transfer  Transfers assist  Chair/bed transfer activity did not occur: Refused (patient requires mecanical assistance using the stedy)  Chair/bed transfer assist level: 2 Helpers (and [place])     Data processing manager   Ambulation assist   Ambulation activity did not occur: Safety/medical concerns          Walk 10 feet activity   Assist  Walk 10 feet activity did not occur: Safety/medical concerns        Walk 50 feet activity   Assist Walk 50 feet with 2 turns activity did not occur: Safety/medical concerns         Walk 150 feet activity   Assist Walk 150 feet activity did not occur: Safety/medical concerns         Walk 10 feet on uneven surface  activity   Assist Walk 10 feet on uneven surfaces activity did not occur: Safety/medical concerns         Wheelchair     Assist Is the patient using a wheelchair?: Yes Type of Wheelchair: Manual    Wheelchair assist level: Dependent - Patient 0%      Wheelchair 50 feet with 2 turns activity    Assist        Assist Level: Dependent - Patient 0%   Wheelchair 150 feet activity     Assist      Assist Level: Dependent - Patient 0%   Blood pressure 129/82, pulse 100, temperature 99.1 F (37.3 C), temperature source Oral, resp. rate 18, height '5\' 9"'$  (1.753 m), weight 119.9 kg, SpO2 96 %.  Medical Problem List and Plan: 1. Functional deficits secondary to debility after acute peritonitis and multiple sequelae              -patient may not yet shower             -ELOS/Goals: 10-12 days, supervision  -Continue CIR, PT/OT  -Plan for colonoscopy tomorrow 2.  Antithrombotics: -DVT/anticoagulation:  Mechanical: Sequential compression devices, below knee Bilateral  lower extremities as high risk for rebleeding.              -antiplatelet therapy: N/A 3. Pain Management: Oxycodone prn.  4. Mood/Behavior/Sleep: LCSW to follow for evaluation and support.             --trazodone prn for insomnia.              -antipsychotic agents: N/a 5. Neuropsych/cognition: This patient is capable of making decisions on his own behalf. 6. Skin/Wound Care: Pressure relief measures.              --wet to dry dressing changed to once a day (per patient request). -wound healing well. May be able to  change to a transitional dressing soon.   7. Fluids/Electrolytes/Nutrition: Monitor I/O. Encourage intake.              --supplements with meals--change to ensure Max as BS trending up w/steroids on board             --Add beneprotein/juven to promote healing.  8. Colon perforation s/p resection/post op abscess: Transitioned to Ceftriaxone 12/15 and on Flagyl per Dr. Juleen China --continue IV antibiotics with drain in place.  --contact ID for input after drain removed.  -Surgery following appreciate assistance 9. Hypomagnesemia: Has required frequent IV supplementation. Recheck in am.  --Oral supplement held due to diarrhea? 10. Crohn's disease: Started on Solumedrol on 12/13 with plans for colonoscopy this weekend potentially.              --Reglan bid for abdominal distension. Fibercon for high output stool/diarrhea  -GI and surgery following appreciate assistance, GI planning colonoscopy  11. Tachycardia: Due to deconditioning. On low dose metoprolol due to orthostatic symptoms.              --monitor for symptoms with increase in activity.              --check orthostatic vitals.  12. ABLA:   -12/16 HGB stable at 8.1  -12/17 recheck HGB  13. T2DM: Hgb A1C- 6.6-->6.1. Was on metformin and Jardiance at home.              --Monitor BS ac/hs and use SSI for elevated BS. Hold Jardiance due to infection.              --Start Semglee 15 units while on steroids per Diabetes  Coordinator recs?  -12/16 start 5 units HS  -12/17 Semglee increased to 8 units  14. Severe OSA: Encourage CPAP use.   15. Fluid overload: Treated with IV lasix 12/10-12/13.  --Monitor I/O and daily weights.  Filed Weights   03/01/22 1432  Weight: 119.9 kg  -12/16 give '20mg'$  IV lasix today  16. ADHD   -restart methylphenidate, discussed dose/medication options with pharmacy, pt advised to bring in his home formulation of methyphenidate   17. Chest congestion CXR completed- bibasilar opacities , likely atelectasis -Dicussed use of incentive spirometer -Started mucinex, symptoms improved  Late Addendum Repeat CXR with pleural effusions indicating CHF, Cardiology consulted, colonoscopy to be delayed by GI, GI ordered CT abdomen   LOS: 2 days A FACE TO FACE EVALUATION WAS PERFORMED  Jennye Boroughs 03/03/2022, 8:17 AM

## 2022-03-03 NOTE — Consult Note (Signed)
Cardiology Consult:   Patient ID: Richard [name]; MRN: [id number]; DOB: [birth year]   Admission date: 03/01/2022  Primary Care Provider: [name], MD Primary Cardiologist: New to [facility])  Chief Complaint:  HF phenotype  Patient Profile:   Richard [name] is a 66 y.o. male with a history of GI issues with question of HF  History of Present Illness:   Richard [name] is a 66 y.o. male with a history of Severe OSA, Morbid Obesity, HTN, DM and scrotal abscess 10/2021. Has a prolonged admission at [facility] 11/23 for peritonitis, and s/p ExLap.  Has GI losses and needed TPN for ~ 1-2 weeks.Has had fatigue, functional decline and was admitted by PMR.  Found to have new O2 requirements prior to evaluated his LLQ abscess.  Found to have HF NOS.  Richard [name] is feeling fine.   Wife notes that he needs O2 for much of his [facility] stay but it would be intermittently on and off.  No clear etiology why.  We are unclear about whether we was weaned prior to transfer but was off when transfering to [facility].  No shortness of breath, DOE .  No PND or orthopnea.  He notes that when the found his O2 say to be low, people were crowding around him and he felt anxious and claustrophobic.  He notes that he is urinating a lot with his present diuretic dose.  No syncope or near syncope . Notes  no palpitations or funny heart beats.     No prior cardiac testing.   Past Medical History:  Diagnosis Date   Adult ADHD    Allergic rhinitis    Anxiety    claustrophobic   Bronchopneumonia 11/03/2013   Colon polyps 2012 and 2013   All hyperplastic except one adenomatous w/out high grade dysplasia (recall 2023 per Dr. Deatra Ina)   COVID-19 virus infection 09/29/2020   Elevated transaminase level 2017   Viral hep screening neg.  Suspect fatty liver.   Hyperlipidemia    Hypertension    Obesity    OSA on CPAP    Severe.  CPAP 10 cm H2O.  Follows Dr. Ander Slade.   Perineal abscess    2022/2023,  recurrent-->fistula->Dr. [name] to do surg   Pneumonia    Seborrheic dermatitis of scalp    and face: hydrocort 2% cream bid prn to face, and fluocinonide 0.5% sol'n to scalp bid prn per Dr. Allyson Sabal (04/2014)   Secondary male hypogonadism 04/2021   Type 2 diabetes mellitus with microalbuminuria ([place]) DM dx-09/27/2015. Microalb 2020    Past Surgical History:  Procedure Laterality Date   COLONOSCOPY W/ POLYPECTOMY  04/2010; 10/2011   Initial screening TCS showed 6 polyps (one adenomatous).  Pt says he returned for repeat TCS 10/2011 showed one hyperplastic polyp: Recall 10 yrs per Dr. Samuella Cota SIGMOIDOSCOPY N/A 02/25/2022   Procedure: FLEXIBLE SIGMOIDOSCOPY;  Surgeon: Irving Copas., MD;  Location: [facility];  Service: Gastroenterology;  Laterality: N/A;   HEMOSTASIS CONTROL  02/25/2022   Procedure: HEMOSTASIS CONTROL;  Surgeon: Rush Landmark Telford Nab., MD;  Location: [facility];  Service: Gastroenterology;;   INCISION AND DRAINAGE ABSCESS N/A 11/07/2021   Procedure: Incision and drainage of scrotal abscess;  Surgeon: Leighton Ruff, MD;  Location: [facility];  Service: General;  Laterality: N/A;   LAPAROTOMY N/A 02/07/2022   Procedure: EXPLORATORY LAPAROTOMY, COLON RESECTION, OSTOMY;  Surgeon: Leighton Ruff, MD;  Location: [facility];  Service: General;  Laterality: N/A;   MOUTH  SURGERY       Medications Prior to Admission: Prior to Admission medications   Medication Sig Start Date End Date Taking? Authorizing Provider  Accu-Chek Softclix Lancets lancets SMARTSIG:Topical 01/19/20   [provider]  acetaminophen (TYLENOL) 325 MG tablet Take 2 tablets (650 mg total) by mouth every 4 (four) hours as needed for mild pain, moderate pain, fever or headache. 03/01/22   Shalhoub, Sherryll Burger, MD  acetaminophen (TYLENOL) 500 MG tablet Take 1,000 mg by mouth every 6 (six) hours as needed for mild pain.    [provider]  alum & mag hydroxide-simeth (MAALOX/MYLANTA)  200-200-20 MG/5ML suspension Take 30 mLs by mouth every 6 (six) hours as needed for indigestion or heartburn (or bloating). 03/01/22   Shalhoub, Sherryll Burger, MD  ascorbic acid (VITAMIN C) 500 MG tablet Take 1 tablet (500 mg total) by mouth 2 (two) times daily. 03/01/22   Shalhoub, Sherryll Burger, MD  atorvastatin (LIPITOR) 40 MG tablet TAKE 1 TABLET BY MOUTH EVERY DAY 02/01/22   McGowen, Adrian Blackwater, MD  cefTRIAXone (ROCEPHIN) 1 g injection Inject 2 g into the muscle daily. 03/01/22   Shalhoub, Sherryll Burger, MD  clotrimazole-betamethasone (LOTRISONE) cream Apply 1 Application topically 2 (two) times daily. 01/23/22   McGowen, Adrian Blackwater, MD  diphenhydrAMINE (BENADRYL) 25 mg capsule Take 25 mg by mouth daily as needed for allergies.    [provider]  diphenoxylate-atropine (LOMOTIL) 2.5-0.025 MG tablet Take 1-2 tablets by mouth 4 (four) times daily as needed for diarrhea or loose stools. 1-2 tabs po qid prn diarrhea 01/12/22   Pyrtle, Lajuan Lines, MD  empagliflozin (JARDIANCE) 10 MG TABS tablet TAKE 1 TABLET BY MOUTH EVERY DAY BEFORE BREAKFAST Patient taking differently: Take 10 mg by mouth daily. 02/05/22   McGowen, Adrian Blackwater, MD  feeding supplement (ENSURE ENLIVE / ENSURE PLUS) LIQD Take 237 mLs by mouth 3 (three) times daily between meals. 03/01/22   Shalhoub, Sherryll Burger, MD  ferrous sulfate 325 (65 FE) MG tablet Take 1 tablet (325 mg total) by mouth 2 (two) times daily before a meal. 03/01/22   Shalhoub, Sherryll Burger, MD  glucose blood (ACCU-CHEK GUIDE) test strip USE TO TEST BLOOD SUGAR TWICE DAILY 07/12/21   McGowen, Adrian Blackwater, MD  hydrOXYzine (ATARAX) 25 MG tablet Take 1 tablet (25 mg total) by mouth 3 (three) times daily as needed for itching. 03/01/22   Shalhoub, Sherryll Burger, MD  insulin aspart (NOVOLOG) 100 UNIT/ML injection Inject 0-15 Units into the skin 3 (three) times daily with meals. 03/01/22   Shalhoub, Sherryll Burger, MD  insulin aspart (NOVOLOG) 100 UNIT/ML injection Inject 0-5 Units into the skin at bedtime.  03/01/22   Shalhoub, Sherryll Burger, MD  lisinopril (ZESTRIL) 10 MG tablet TAKE 1 TABLET DAILY 01/10/22   McGowen, Adrian Blackwater, MD  metFORMIN (GLUCOPHAGE) 500 MG tablet TAKE 1 TABLET BY MOUTH TWICE A DAY WITH FOOD 02/01/22   McGowen, Adrian Blackwater, MD  methylphenidate (METADATE CD) 20 MG CR capsule Take 1 capsule (20 mg total) by mouth daily as needed. Patient taking differently: Take 20 mg by mouth daily as needed (for focuswhile at work). 01/16/22   McGowen, Adrian Blackwater, MD  methylPREDNISolone sodium succinate (SOLU-MEDROL) 40 mg/mL injection Inject 1 mL (40 mg total) into the vein daily. 03/02/22   Shalhoub, Sherryll Burger, MD  metoCLOPramide (REGLAN) 5 MG tablet Take 1 tablet (5 mg total) by mouth 2 (two) times daily with breakfast and lunch. 03/02/22   Shalhoub, Sherryll Burger, MD  metoprolol tartrate (LOPRESSOR) 25 MG tablet Take 1 tablet (25 mg total) by mouth 2 (two) times daily. 03/01/22 03/01/23  Shalhoub, Sherryll Burger, MD  metroNIDAZOLE (FLAGYL) 500 MG/100ML Inject 100 mLs (500 mg total) into the vein every 12 (twelve) hours. 03/01/22   Shalhoub, Sherryll Burger, MD  Multiple Vitamin (MULTIVITAMIN WITH MINERALS) TABS tablet Take 1 tablet by mouth daily. 03/02/22   Shalhoub, Sherryll Burger, MD  ondansetron (ZOFRAN) 4 MG/2ML SOLN injection Inject 2 mLs (4 mg total) into the vein every 6 (six) hours as needed for nausea or vomiting. 03/01/22   Shalhoub, Sherryll Burger, MD  pantoprazole (PROTONIX) 40 MG tablet Take 1 tablet (40 mg total) by mouth daily. 03/02/22   Shalhoub, Sherryll Burger, MD  polycarbophil (FIBERCON) 625 MG tablet Take 1 tablet (625 mg total) by mouth 2 (two) times daily. 03/01/22   Shalhoub, Sherryll Burger, MD  SYRINGE-NEEDLE, DISP, 3 ML 22G X 1-1/2" 3 ML MISC USE TO ADMINISTER TESTOSTERONE INJECTION EVERY 2 WEEKS 06/06/21   McGowen, Adrian Blackwater, MD  Syringe/Needle, Disp, 18G X 1" 3 ML MISC USE TO ADMINISTER TESTOSTERONE INJECTION EVERY 2 WEEKS 06/06/21   McGowen, Adrian Blackwater, MD  testosterone cypionate (DEPOTESTOSTERONE CYPIONATE) 200 MG/ML  injection 200 MG (1 ML) SQ Q 2 WEEKS Patient not taking: Reported on 02/08/2022 12/12/21   [name], MD  thiamine (VITAMIN B-1) 100 MG tablet Take 1 tablet (100 mg total) by mouth daily. 03/02/22   Shalhoub, Sherryll Burger, MD     Allergies:    Allergies  Allergen Reactions   Lobster [Shellfish Allergy] Nausea Only   Poison Ivy Extract Itching    Social History:   Social History   Socioeconomic History   Marital status: Married    Spouse name: Not on file   Number of children: Not on file   Years of education: Not on file   Highest education level: Not on file  Occupational History   Occupation: Counselling psychologist  Tobacco Use   Smoking status: Former    Packs/day: 1.00    Types: Cigarettes    Quit date: 07/17/2002    Years since quitting: 19.6   Smokeless tobacco: Never  Vaping Use   Vaping Use: Never used  Substance and Sexual Activity   Alcohol use: Yes    Alcohol/week: 7.0 standard drinks of alcohol    Types: 7 Standard drinks or equivalent per week    Comment: wine every night with dinner   Drug use: No   Sexual activity: Yes    Birth control/protection: None  Other Topics Concern   Not on file  Social History Narrative   Married, 1 child--grown.   Occupation: Financial planner"   Tobacco: 20 pack-yr hx; quit 2004.   Alcohol: nightly cocktail   Drugs: none   Exercise: "walk some when I can".   Diet: normal.   Right Handed. Used to be left handed   Three story home   Drinks a lot of tea and occasional coffee   Social Determinants of Health   Financial Resource Strain: Unknown (10/11/2021)   Overall Financial Resource Strain (CARDIA)    Difficulty of Paying Living Expenses: Patient refused  Food Insecurity: No Food Insecurity (02/08/2022)   Hunger Vital Sign    Worried About Running Out of Food in the Last Year: Never true    Ran Out of Food in the Last Year: Never true  Transportation Needs: No Transportation Needs (02/08/2022)   PRAPARE -  Transportation    Lack of  Transportation (Medical): No    Lack of Transportation (Non-Medical): No  Physical Activity: Unknown (10/11/2021)   Exercise Vital Sign    Days of Exercise per Week: Patient refused    Minutes of Exercise per Session: Not on file  Stress: No Stress Concern Present (10/11/2021)   [place]    Feeling of Stress : Not at all  Social Connections: Unknown (10/11/2021)   Social Connection and Isolation Panel [NHANES]    Frequency of Communication with Friends and Family: Patient refused    Frequency of Social Gatherings with Friends and Family: Patient refused    Attends Religious Services: Patient refused    Active Member of Clubs or Organizations: Patient refused    Attends Archivist Meetings: Not on file    Marital Status: Patient refused  Intimate Partner Violence: Not At Risk (02/08/2022)   Humiliation, Afraid, Rape, and Kick questionnaire    Fear of Current or Ex-Partner: No    Emotionally Abused: No    Physically Abused: No    Sexually Abused: No    Family History:   The patient's family history includes Allergies in his father; Cancer in his father; Lung cancer in his father. There is no history of Colon cancer, Esophageal cancer, Rectal cancer, or Stomach cancer.    ROS:  Please see the history of present illness.  All other ROS reviewed and negative.     Physical Exam/Data:   Vitals:   03/03/22 0653 03/03/22 0905 03/03/22 0905 03/03/22 1417  BP: 129/82 129/71 129/71 116/69  Pulse: 100 70 70 (!) 110  Resp: 18   20  Temp: 99.1 F (37.3 C)   98.8 F (37.1 C)  TempSrc: Oral   Oral  SpO2: 96%   93%  Weight:      Height:        Intake/Output Summary (Last 24 hours) at 03/03/2022 1615 Last data filed at 03/03/2022 1341 Gross per 24 hour  Intake 1206.63 ml  Output 1825 ml  Net -618.37 ml   Filed Weights   03/01/22 1432  Weight: 119.9 kg   Body mass index is 39.03  kg/m.   Gen: no distress, Morbid obesity  Neck: Thick neck Cardiac: No Rubs or Gallops, no murmur, RRR and distant  Respiratory: Decreased breath sounds bilaterally normal effort,  normal respiratory rate GI: Soft, nontender, non-distended  MS: +2 edema;  moves all extremities Integument: Skin feels warm Neuro:  At time of evaluation, alert and oriented to person/place/time/situation  Psych: Normal affect, patient feels no  EKG:  The ECG that was done  was personally reviewed and demonstrates sinus tachycardia  Relevant CV Studies:  No results found for this or any previous visit from the past 3650 days.   Laboratory Data:  Chemistry Recent Labs  Lab 02/28/22 0410 03/01/22 0447  NA 140 142  K 4.2 4.0  CL 101 103  CO2 30 30  GLUCOSE 275* 252*  BUN 11 16  CREATININE 0.37* 0.35*  CALCIUM 8.0* 8.0*  GFRNONAA >60 >60  ANIONGAP 9 9    Recent Labs  Lab 02/28/22 0410 03/01/22 0447  PROT 6.2* 5.8*  ALBUMIN 2.7* 2.3*  AST 16 15  ALT 10 10  ALKPHOS 98 97  BILITOT 0.6 0.3   Hematology Recent Labs  Lab 03/01/22 0447 03/03/22 1000  WBC 7.3 13.4*  RBC 2.60* 3.35*  HGB 8.1* 10.4*  HCT 25.9* 32.7*  MCV 99.6 97.6  MCH 31.2 31.0  MCHC 31.3 31.8  RDW 15.3 15.6*  PLT 262 359   Cardiac EnzymesNo results for input(s): "TROPONINI" in the last 168 hours. No results for input(s): "TROPIPOC" in the last 168 hours.  BNPNo results for input(s): "BNP", "PROBNP" in the last 168 hours.  DDimer No results for input(s): "DDIMER" in the last 168 hours.  Radiology/Studies:  DG Chest 2 View  Result Date: 03/03/2022 CLINICAL DATA:  200808 Hypoxia 200808 [id number] Abnormal CXR [id number] EXAM: PORTABLE CHEST two views COMPARISON:  03/02/2022 FINDINGS: Cardiac silhouette is prominent. There is pulmonary interstitial prominence with vascular congestion. No focal consolidation. No pneumothorax identified. Large bilateral pleural effusions moderate layering bilateral pleural effusions.  Right-sided PICC tip overlies mid SVC. IMPRESSION: Findings suggest CHF. Electronically Signed   By: Sammie Bench M.D.   On: 03/03/2022 12:59    Assessment and Plan:   Heart Failure NOS Morbid Obesity With protein calorie malnutrition with Albumin 2.3 HTN with DM  Orthostatic HR - will get echo - Diuretic regimen: 20 IV lasix to start; he has hypomagnesemia that would worsen with aggressive diuresis - some sx (anemia) may be related to his significant hypoalbuminemia - BNP tomorrow   Colon perforation s/p resection and abscess Crohn's Disease Anemia - as per primary   For questions or updates, please contact [facility] Please consult [email or web address] for contact info under Cardiology/STEMI.   Rudean Haskell, MD [address] [place], [place] [address] [phone]  4:15 PM

## 2022-03-03 NOTE — Progress Notes (Signed)
Referring Physician(s): Jeanmarie Hubert  Supervising Physician: Jacqulynn Cadet  Patient Status:  [facility] - In-pt  Chief Complaint:  Abdominal abscess  Subjective: Pt doing ok today; some hypoxia noted recently; no worsening abd pain; CXR today sugg CHF; temp 99; WBC 13.4(7.3)   Allergies: Lobster [shellfish allergy] and Poison ivy extract  Medications: Prior to Admission medications   Medication Sig Start Date End Date Taking? Authorizing Provider  Accu-Chek Softclix Lancets lancets SMARTSIG:Topical 01/19/20   [provider]  acetaminophen (TYLENOL) 325 MG tablet Take 2 tablets (650 mg total) by mouth every 4 (four) hours as needed for mild pain, moderate pain, fever or headache. 03/01/22   Shalhoub, Sherryll Burger, MD  acetaminophen (TYLENOL) 500 MG tablet Take 1,000 mg by mouth every 6 (six) hours as needed for mild pain.    [provider]  alum & mag hydroxide-simeth (MAALOX/MYLANTA) 200-200-20 MG/5ML suspension Take 30 mLs by mouth every 6 (six) hours as needed for indigestion or heartburn (or bloating). 03/01/22   Shalhoub, Sherryll Burger, MD  ascorbic acid (VITAMIN C) 500 MG tablet Take 1 tablet (500 mg total) by mouth 2 (two) times daily. 03/01/22   Shalhoub, Sherryll Burger, MD  atorvastatin (LIPITOR) 40 MG tablet TAKE 1 TABLET BY MOUTH EVERY DAY 02/01/22   McGowen, Adrian Blackwater, MD  cefTRIAXone (ROCEPHIN) 1 g injection Inject 2 g into the muscle daily. 03/01/22   Shalhoub, Sherryll Burger, MD  clotrimazole-betamethasone (LOTRISONE) cream Apply 1 Application topically 2 (two) times daily. 01/23/22   McGowen, Adrian Blackwater, MD  diphenhydrAMINE (BENADRYL) 25 mg capsule Take 25 mg by mouth daily as needed for allergies.    [provider]  diphenoxylate-atropine (LOMOTIL) 2.5-0.025 MG tablet Take 1-2 tablets by mouth 4 (four) times daily as needed for diarrhea or loose stools. 1-2 tabs po qid prn diarrhea 01/12/22   Pyrtle, Lajuan Lines, MD  empagliflozin (JARDIANCE) 10 MG TABS tablet TAKE 1  TABLET BY MOUTH EVERY DAY BEFORE BREAKFAST Patient taking differently: Take 10 mg by mouth daily. 02/05/22   McGowen, Adrian Blackwater, MD  feeding supplement (ENSURE ENLIVE / ENSURE PLUS) LIQD Take 237 mLs by mouth 3 (three) times daily between meals. 03/01/22   Shalhoub, Sherryll Burger, MD  ferrous sulfate 325 (65 FE) MG tablet Take 1 tablet (325 mg total) by mouth 2 (two) times daily before a meal. 03/01/22   Shalhoub, Sherryll Burger, MD  glucose blood (ACCU-CHEK GUIDE) test strip USE TO TEST BLOOD SUGAR TWICE DAILY 07/12/21   McGowen, Adrian Blackwater, MD  hydrOXYzine (ATARAX) 25 MG tablet Take 1 tablet (25 mg total) by mouth 3 (three) times daily as needed for itching. 03/01/22   Shalhoub, Sherryll Burger, MD  insulin aspart (NOVOLOG) 100 UNIT/ML injection Inject 0-15 Units into the skin 3 (three) times daily with meals. 03/01/22   Shalhoub, Sherryll Burger, MD  insulin aspart (NOVOLOG) 100 UNIT/ML injection Inject 0-5 Units into the skin at bedtime. 03/01/22   Shalhoub, Sherryll Burger, MD  lisinopril (ZESTRIL) 10 MG tablet TAKE 1 TABLET DAILY 01/10/22   McGowen, Adrian Blackwater, MD  metFORMIN (GLUCOPHAGE) 500 MG tablet TAKE 1 TABLET BY MOUTH TWICE A DAY WITH FOOD 02/01/22   McGowen, Adrian Blackwater, MD  methylphenidate (METADATE CD) 20 MG CR capsule Take 1 capsule (20 mg total) by mouth daily as needed. Patient taking differently: Take 20 mg by mouth daily as needed (for focuswhile at work). 01/16/22   McGowen, Adrian Blackwater, MD  methylPREDNISolone sodium succinate (SOLU-MEDROL) 40 mg/mL injection Inject 1  mL (40 mg total) into the vein daily. 03/02/22   Shalhoub, Sherryll Burger, MD  metoCLOPramide (REGLAN) 5 MG tablet Take 1 tablet (5 mg total) by mouth 2 (two) times daily with breakfast and lunch. 03/02/22   Shalhoub, Sherryll Burger, MD  metoprolol tartrate (LOPRESSOR) 25 MG tablet Take 1 tablet (25 mg total) by mouth 2 (two) times daily. 03/01/22 03/01/23  Shalhoub, Sherryll Burger, MD  metroNIDAZOLE (FLAGYL) 500 MG/100ML Inject 100 mLs (500 mg total) into the vein every 12  (twelve) hours. 03/01/22   Shalhoub, Sherryll Burger, MD  Multiple Vitamin (MULTIVITAMIN WITH MINERALS) TABS tablet Take 1 tablet by mouth daily. 03/02/22   Shalhoub, Sherryll Burger, MD  ondansetron (ZOFRAN) 4 MG/2ML SOLN injection Inject 2 mLs (4 mg total) into the vein every 6 (six) hours as needed for nausea or vomiting. 03/01/22   Shalhoub, Sherryll Burger, MD  pantoprazole (PROTONIX) 40 MG tablet Take 1 tablet (40 mg total) by mouth daily. 03/02/22   Shalhoub, Sherryll Burger, MD  polycarbophil (FIBERCON) 625 MG tablet Take 1 tablet (625 mg total) by mouth 2 (two) times daily. 03/01/22   Shalhoub, Sherryll Burger, MD  SYRINGE-NEEDLE, DISP, 3 ML 22G X 1-1/2" 3 ML MISC USE TO ADMINISTER TESTOSTERONE INJECTION EVERY 2 WEEKS 06/06/21   McGowen, Adrian Blackwater, MD  Syringe/Needle, Disp, 18G X 1" 3 ML MISC USE TO ADMINISTER TESTOSTERONE INJECTION EVERY 2 WEEKS 06/06/21   McGowen, Adrian Blackwater, MD  testosterone cypionate (DEPOTESTOSTERONE CYPIONATE) 200 MG/ML injection 200 MG (1 ML) SQ Q 2 WEEKS Patient not taking: Reported on 02/08/2022 12/12/21   [name], MD  thiamine (VITAMIN B-1) 100 MG tablet Take 1 tablet (100 mg total) by mouth daily. 03/02/22   Shalhoub, Sherryll Burger, MD     Vital Signs: BP 129/71   Pulse 70   Temp 99.1 F (37.3 C) (Oral)   Resp 18   Ht '5\' 9"'$  (1.753 m)   Wt 264 lb 5.3 oz (119.9 kg)   SpO2 96%   BMI 39.03 kg/m   Physical Exam awake/alert; left abd drain intact, insertion site ok,NT; output about 25-30 cc in bulb turbid beige colored fluid  Imaging: DG Chest 2 View  Result Date: 03/03/2022 CLINICAL DATA:  200808 Hypoxia 200808 [id number] Abnormal CXR [id number] EXAM: PORTABLE CHEST two views COMPARISON:  03/02/2022 FINDINGS: Cardiac silhouette is prominent. There is pulmonary interstitial prominence with vascular congestion. No focal consolidation. No pneumothorax identified. Large bilateral pleural effusions moderate layering bilateral pleural effusions. Right-sided PICC tip overlies mid SVC. IMPRESSION:  Findings suggest CHF. Electronically Signed   By: Sammie Bench M.D.   On: 03/03/2022 12:59   DG CHEST PORT 1 VIEW  Result Date: 03/02/2022 CLINICAL DATA:  Cough EXAM: PORTABLE CHEST 1 VIEW COMPARISON:  Chest x-ray February 11, 2022 FINDINGS: Right upper extremity PICC terminates in the mid SVC. Unchanged cardiomegaly. Stable mediastinal contours. Bibasilar bandlike opacities. Small right pleural effusion. No large pneumothorax. The visualized upper abdomen is unremarkable. No acute osseous abnormality. IMPRESSION: 1. Bibasilar bandlike opacities, likely atelectasis but aspiration/infection could appear similarly in the appropriate clinical context. 2. Small right pleural effusion. Electronically Signed   By: Beryle Flock M.D.   On: 03/02/2022 18:32    Labs:  CBC: Recent Labs    02/27/22 0225 02/27/22 0812 02/27/22 1730 02/28/22 0410 03/01/22 0447 03/03/22 1000  WBC 5.3  --   --  5.4 7.3 13.4*  HGB 7.8*   < > 8.2* 8.4* 8.1* 10.4*  HCT 24.6*   < >  26.0* 26.6* 25.9* 32.7*  PLT 203  --   --  223 262 359   < > = values in this interval not displayed.    COAGS: Recent Labs    02/07/22 2030 02/26/22 0816  INR 1.3* 1.3*  APTT 30  --     BMP: Recent Labs    02/26/22 0521 02/27/22 0225 02/28/22 0410 03/01/22 0447  NA 140 140 140 142  K 3.5 3.5 4.2 4.0  CL 102 101 101 103  CO2 34* 33* 30 30  GLUCOSE 115* 187* 275* 252*  BUN '11 11 11 16  '$ CALCIUM 7.8* 8.0* 8.0* 8.0*  CREATININE <0.30* 0.42* 0.37* 0.35*  GFRNONAA NOT CALCULATED >60 >60 >60    LIVER FUNCTION TESTS: Recent Labs    02/26/22 0521 02/27/22 0225 02/28/22 0410 03/01/22 0447  BILITOT 0.7 0.4 0.6 0.3  AST '15 16 16 15  '$ ALT '11 10 10 10  '$ ALKPHOS 84 80 98 97  PROT 5.4* 5.3* 6.2* 5.8*  ALBUMIN 2.4* 2.8* 2.7* 2.3*    Assessment and Plan: Patient with history of IBD,  sigmoid colon perforation with exploratory lap, colon resection, and colostomy 02/07/22; now with postop left abdominal abscess, status  post drain placement on 12/12 ( 10 fr to JP) ; temp 99.1, WBC 13.4(7.3), hgb 10.4(8.1); drain fluid cx- e coli/bacteroides;  continue with drain irrigation, close output monitoring ,lab checks; obtain follow-up imaging once output minimal or if WBC cont to rise (pt also on solumedrol); will need injection study before drain removal. Other plans per [facility], GI,TRH.    Electronically Signed: D. Rowe Robert, PA-C 03/03/2022, 1:24 PM   I spent a total of 15 Minutes at the the patient's bedside AND on the patient's hospital floor or unit, greater than 50% of which was counseling/coordinating care for left abdominal abscess drain    Patient ID: Richard [name], male   DOB: 1955/10/08, 66 y.o.   MRN: [id number]

## 2022-03-03 NOTE — Progress Notes (Signed)
Patient ID: Richard [name], male   DOB: [birth year], 66 y.o.   MRN: [id number]    Progress Note   Subjective  Day # 24-postop diagnosis of Crohn's disease status post emergent exploratory lap/descending and sigmoid resection with transverse colostomy for sigmoid perforation 02/07/2022  Acute major GI bleed 02/25/2022-sigmoidoscopy with multiple deep rectal ulcers several with stigmata of recent bleeding treated with Hemospray also noted nonbleeding rectal varices No further active bleeding since  Large left lower quadrant abscess status post IR drainage 02/26/2022-cultures positive for E. coli and Bacteroides  Day #2 rehab  Patient continues on IV Solu-Medrol IV Rocephin IV Flagyl  Patient had an episode of coughing and shortness of breath yesterday afternoon He had desaturation with pulse ox 79 to 81% with this episode.  Placed on 2 L nasal cannula.  Patient says he had a panic attack with this episode also Chest x-ray shows bibasilar bandlike opacities likely atelectasis but aspiration/infection could appear similar small right effusion  He is comfortable on 2 L nasal O2-sat 96 Has been coughing off and on but he says this is not unusual for him does not feel short of breath at rest  He is anxious with transfer to rehab and is anxious about how he will deal with prepping with the colostomy if he cannot have adequate help but also anxious about rehab pushing to get rid of his urinary device as he says he cannot manage himself at this point     Objective   Vital signs in last 24 hours: Temp:  [98.9 F (37.2 C)-99.1 F (37.3 C)] 99.1 F (37.3 C) (12/17 0653) Pulse Rate:  [70-110] 70 (12/17 0905) Resp:  [18-19] 18 (12/17 0653) BP: (120-154)/(71-88) 129/71 (12/17 0905) SpO2:  [91 %-96 %] 96 % (12/17 0653) Last BM Date : 03/02/22 General:    Older white male in NAD-wife at bedside Heart:  Regular rate and rhythm; no murmurs Lungs: Respirations even and unlabored, bilateral  scattered rhonchi no wheezing Abdomen:  Soft, large and nondistended. Normal bowel sounds.  Colostomy with thick liquid brown stool, no heme -abscess drain with about 50 cc of purulent material Extremities: 2+ edema to the shins Neurologic:  Alert and oriented,  grossly normal neurologically. Psych:  Cooperative. Normal mood and affect.  Intake/Output from previous day: 12/16 0701 - 12/17 0700 In: 386.6 [I.V.:86.6; IV Piggyback:300] Out: 2825 [Urine:2500; Stool:325] Intake/Output this shift: Total I/O In: 490 [P.O.:480; I.V.:10] Out: -   Lab Results: Recent Labs    03/01/22 0447  WBC 7.3  HGB 8.1*  HCT 25.9*  PLT 262   BMET Recent Labs    03/01/22 0447  NA 142  K 4.0  CL 103  CO2 30  GLUCOSE 252*  BUN 16  CREATININE 0.35*  CALCIUM 8.0*   LFT Recent Labs    03/01/22 0447  PROT 5.8*  ALBUMIN 2.3*  AST 15  ALT 10  ALKPHOS 97  BILITOT 0.3   PT/INR No results for input(s): "LABPROT", "INR" in the last 72 hours.  Studies/Results: DG CHEST PORT 1 VIEW  Result Date: 03/02/2022 CLINICAL DATA:  Cough EXAM: PORTABLE CHEST 1 VIEW COMPARISON:  Chest x-ray February 11, 2022 FINDINGS: Right upper extremity PICC terminates in the mid SVC. Unchanged cardiomegaly. Stable mediastinal contours. Bibasilar bandlike opacities. Small right pleural effusion. No large pneumothorax. The visualized upper abdomen is unremarkable. No acute osseous abnormality. IMPRESSION: 1. Bibasilar bandlike opacities, likely atelectasis but aspiration/infection could appear similarly in the appropriate clinical context. 2. Small  right pleural effusion. Electronically Signed   By: Beryle Flock M.D.   On: 03/02/2022 18:32       Assessment / Plan:    #64 66 year old white male now 3 weeks status post emergency exploratory lap/descending and sigmoid resection with transverse colostomy for sigmoid perforation delayed after colonoscopy done 4 days previous with new diagnosis of IBD favoring  Crohn's Prolonged postop ileus resolved  We had initially planned for colonoscopy via the colostomy to reevaluate the right colon tomorrow Monday, 03/04/2022.  #2 acute major GI bleed 02/25/2022 from the rectum-finding of multiple deep rectal ulcerations several with stigmata of recent bleeding treated with Hemospray and incidental finding of rectal varices No active bleeding since-hemoglobin stable at 8.1  Darted on Solu-Medrol 40 mg daily on 02/27/2022 for severe rectal ulceration/Crohn's he is also on IV metronidazole for management of Crohn's.  #3 left lower quadrant large abscess status post IR drainage 02/26/2022-SIRS positive for E. coli and Bacteroides-on Rocephin Continues to have purulent drainage  Last CT 02/26/2022  #4 persistent peritoneal fluid status post paracentesis with removal of 750 cc 02/26/2022 cultures negative  #5 cough and shortness of breath yesterday had episode with coughing with desaturation-on O2 2 L Chest x-ray shows bibasilar bandlike densities atelectasis most likely but cannot rule out infiltrate and small right effusion  Sats in the 90s on 2 L  Plan; repeat chest x-ray PA and lateral   CBC with differential today  Colonoscopy is not urgent to be done tomorrow, with change in his pulmonary status feel it would be best to hold off on prep today, will resume regular diet and cancel colonoscopy for tomorrow with Dr. [name].  We will continue to follow him daily and colonoscopy can be rescheduled for another day later in the week, pending his status  He may also need repeat CT this week to reevaluate the large left lower quadrant abscess    Principal Problem:   Debility Active Problems:   ABLA (acute blood loss anemia)   Crohn's disease of large intestine with other complication ([place])     LOS: 2 days   Matelyn Antonelli PA-C 03/03/2022, 11:02 AM

## 2022-03-04 ENCOUNTER — Encounter (HOSPITAL_COMMUNITY)
Admission: RE | Disposition: A | Discharge status: Other Acute Inpatient Hospital | Payer: Self-pay | Source: Intra-hospital | Attending: Physical Medicine & Rehabilitation

## 2022-03-04 ENCOUNTER — Encounter (HOSPITAL_COMMUNITY): Payer: Self-pay | Admitting: Physical Medicine & Rehabilitation

## 2022-03-04 ENCOUNTER — Inpatient Hospital Stay (HOSPITAL_COMMUNITY): Payer: 59

## 2022-03-04 ENCOUNTER — Telehealth: Payer: Self-pay

## 2022-03-04 DIAGNOSIS — K921 Melena: Secondary | ICD-10-CM

## 2022-03-04 DIAGNOSIS — K626 Ulcer of anus and rectum: Secondary | ICD-10-CM

## 2022-03-04 DIAGNOSIS — R5381 Other malaise: Secondary | ICD-10-CM | POA: Diagnosis not present

## 2022-03-04 HISTORY — PX: HEMOSTASIS CONTROL: SHX6838

## 2022-03-04 HISTORY — PX: FLEXIBLE SIGMOIDOSCOPY: SHX5431

## 2022-03-04 LAB — POCT I-STAT, CHEM 8
BUN: 17 mg/dL (ref 8–23)
Calcium, Ion: 1.21 mmol/L (ref 1.15–1.40)
Chloride: 98 mmol/L (ref 98–111)
Creatinine, Ser: 0.4 mg/dL — ABNORMAL LOW (ref 0.61–1.24)
Glucose, Bld: 307 mg/dL — ABNORMAL HIGH (ref 70–99)
HCT: 32 % — ABNORMAL LOW (ref 39.0–52.0)
Hemoglobin: 10.9 g/dL — ABNORMAL LOW (ref 13.0–17.0)
Potassium: 4 mmol/L (ref 3.5–5.1)
Sodium: 138 mmol/L (ref 135–145)
TCO2: 29 mmol/L (ref 22–32)

## 2022-03-04 LAB — CBC WITH DIFFERENTIAL/PLATELET
Abs Immature Granulocytes: 0.39 10*3/uL — ABNORMAL HIGH (ref 0.00–0.07)
Basophils Absolute: 0 10*3/uL (ref 0.0–0.1)
Basophils Relative: 0 %
Eosinophils Absolute: 0.1 10*3/uL (ref 0.0–0.5)
Eosinophils Relative: 1 %
HCT: 29.2 % — ABNORMAL LOW (ref 39.0–52.0)
Hemoglobin: 9.4 g/dL — ABNORMAL LOW (ref 13.0–17.0)
Immature Granulocytes: 4 %
Lymphocytes Relative: 12 %
Lymphs Abs: 1.2 10*3/uL (ref 0.7–4.0)
MCH: 31.5 pg (ref 26.0–34.0)
MCHC: 32.2 g/dL (ref 30.0–36.0)
MCV: 98 fL (ref 80.0–100.0)
Monocytes Absolute: 0.7 10*3/uL (ref 0.1–1.0)
Monocytes Relative: 6 %
Neutro Abs: 8 10*3/uL — ABNORMAL HIGH (ref 1.7–7.7)
Neutrophils Relative %: 77 %
Platelets: 325 10*3/uL (ref 150–400)
RBC: 2.98 MIL/uL — ABNORMAL LOW (ref 4.22–5.81)
RDW: 15.6 % — ABNORMAL HIGH (ref 11.5–15.5)
WBC: 10.4 10*3/uL (ref 4.0–10.5)
nRBC: 0.2 % (ref 0.0–0.2)

## 2022-03-04 LAB — COMPREHENSIVE METABOLIC PANEL
ALT: 24 U/L (ref 0–44)
AST: 28 U/L (ref 15–41)
Albumin: 2.2 g/dL — ABNORMAL LOW (ref 3.5–5.0)
Alkaline Phosphatase: 146 U/L — ABNORMAL HIGH (ref 38–126)
Anion gap: 8 (ref 5–15)
BUN: 21 mg/dL (ref 8–23)
CO2: 30 mmol/L (ref 22–32)
Calcium: 8.7 mg/dL — ABNORMAL LOW (ref 8.9–10.3)
Chloride: 98 mmol/L (ref 98–111)
Creatinine, Ser: 0.45 mg/dL — ABNORMAL LOW (ref 0.61–1.24)
GFR, Estimated: >60 mL/min (ref >60)
Glucose, Bld: 249 mg/dL — ABNORMAL HIGH (ref 70–99)
Potassium: 3.9 mmol/L (ref 3.5–5.1)
Sodium: 136 mmol/L (ref 135–145)
Total Bilirubin: 0.6 mg/dL (ref 0.3–1.2)
Total Protein: 5.6 g/dL — ABNORMAL LOW (ref 6.5–8.1)

## 2022-03-04 LAB — GLUCOSE, CAPILLARY
Glucose-Capillary: 219 mg/dL — ABNORMAL HIGH (ref 70–99)
Glucose-Capillary: 178 mg/dL — ABNORMAL HIGH (ref 70–99)
Glucose-Capillary: 288 mg/dL — ABNORMAL HIGH (ref 70–99)
Glucose-Capillary: 282 mg/dL — ABNORMAL HIGH (ref 70–99)

## 2022-03-04 LAB — BRAIN NATRIURETIC PEPTIDE: B Natriuretic Peptide: 105.7 pg/mL — ABNORMAL HIGH (ref 0.0–100.0)

## 2022-03-04 SURGERY — COLONOSCOPY WITH PROPOFOL
Anesthesia: Monitor Anesthesia Care

## 2022-03-04 SURGERY — SIGMOIDOSCOPY, FLEXIBLE
Anesthesia: Moderate Sedation

## 2022-03-04 MED ORDER — PEG-KCL-NACL-NASULF-NA ASC-C 100 G PO SOLR
0.5000 | Freq: Once | ORAL | Status: DC
  Filled 2022-03-04: qty 1

## 2022-03-04 MED ORDER — INSULIN GLARGINE-YFGN 100 UNIT/ML ~~LOC~~ SOLN
12.0000 [IU] | Freq: Every day | SUBCUTANEOUS | Status: DC
  Administered 2022-03-04: 12 [IU] via SUBCUTANEOUS
  Filled 2022-03-04 (×2): qty 0.12

## 2022-03-04 MED ORDER — IOHEXOL 9 MG/ML PO SOLN
500.0000 mL | ORAL | Status: AC
  Administered 2022-03-04 (×2): 500 mL via ORAL

## 2022-03-04 MED ORDER — METOCLOPRAMIDE HCL 5 MG/ML IJ SOLN
10.0000 mg | Freq: Four times a day (QID) | INTRAMUSCULAR | Status: DC
  Filled 2022-03-04: qty 2

## 2022-03-04 MED ORDER — ZINC SULFATE 220 (50 ZN) MG PO CAPS
220.0000 mg | ORAL_CAPSULE | Freq: Every day | ORAL | Status: DC
  Administered 2022-03-04: 220 mg via ORAL
  Filled 2022-03-04: qty 1

## 2022-03-04 MED ORDER — METHYLPHENIDATE HCL ER (CD) 20 MG PO CPCR: 20.0000 mg | ORAL_CAPSULE | Freq: Every day | ORAL | 0 refills | Status: DC | PRN

## 2022-03-04 MED ORDER — IOHEXOL 350 MG/ML SOLN
75.0000 mL | Freq: Once | INTRAVENOUS | Status: AC | PRN
  Administered 2022-03-04: 75 mL via INTRAVENOUS

## 2022-03-04 MED ORDER — PEG-KCL-NACL-NASULF-NA ASC-C 100 G PO SOLR: 1.0000 | Freq: Once | ORAL | Status: DC

## 2022-03-04 NOTE — Progress Notes (Signed)
Inpatient Rehabilitation  Patient information reviewed and entered into eRehab system by Michaila Kenney Aqil Goetting, OTR/L, Rehab Quality Coordinator.   Information including medical coding, functional ability and quality indicators will be reviewed and updated through discharge.   

## 2022-03-04 NOTE — Progress Notes (Signed)
Writer called to patient's room by his wife who stated that he was bleeding from his rectum. Writer noted frank blood in patient's brief and underneath him. Lezlie Lye, PA notified.

## 2022-03-04 NOTE — Progress Notes (Addendum)
Rounding Note    Patient Name: Richard [name] Date of Encounter: 03/04/2022  [place] Cardiologist: Werner Lean, MD   Subjective   Patient leaning slightly back in wheelchair. No complaints.   Inpatient Medications    Scheduled Meds:  (feeding supplement) PROSource Plus  30 mL Oral TID WC   ascorbic acid  500 mg Oral BID   atorvastatin  40 mg Oral Daily   Chlorhexidine Gluconate Cloth  6 each Topical BID   ferrous sulfate  325 mg Oral BID WC   guaiFENesin  600 mg Oral BID   insulin aspart  0-15 Units Subcutaneous TID WC   insulin aspart  0-5 Units Subcutaneous QHS   insulin glargine-yfgn  12 Units Subcutaneous QHS   lip balm   Topical BID   methylPREDNISolone (SOLU-MEDROL) injection  40 mg Intravenous Daily   metoprolol tartrate  6.25 mg Oral BID   multivitamin with minerals  1 tablet Oral Daily   nutrition supplement (JUVEN)  1 packet Oral BID WC   pantoprazole  40 mg Oral Daily   polycarbophil  625 mg Oral BID   Ensure Max Protein  11 oz Oral BID   sodium chloride flush  10-40 mL Intracatheter Q12H   thiamine  100 mg Oral Daily   Continuous Infusions:  sodium chloride 10 mL/hr at 03/03/22 1712   cefTRIAXone (ROCEPHIN)  IV 2 g (03/03/22 1842)   metroNIDAZOLE 500 mg (03/04/22 0520)   PRN Meds: sodium chloride, acetaminophen, alum & mag hydroxide-simeth, bisacodyl, diphenhydrAMINE, guaiFENesin-dextromethorphan, hydrocortisone cream, hydrOXYzine, magic mouthwash, menthol-cetylpyridinium, methylphenidate, naphazoline-glycerin, ondansetron (ZOFRAN) IV, mouth rinse, phenol, polyethylene glycol, prochlorperazine **OR** prochlorperazine **OR** prochlorperazine, simethicone, sodium chloride, sodium chloride flush, traZODone   Vital Signs    Vitals:   03/03/22 1417 03/03/22 1934 03/04/22 0411 03/04/22 0959  BP: 116/69 138/72 126/74 128/72  Pulse: (!) 110 (!) 106 96 92  Resp: '20 16 16   '$ Temp: 98.8 F (37.1 C) 98.7 F (37.1 C) 98.7 F (37.1 C)    TempSrc: Oral Oral Oral   SpO2: 93% 97% 96%   Weight:      Height:        Intake/Output Summary (Last 24 hours) at 03/04/2022 1029 Last data filed at 03/04/2022 1006 Gross per 24 hour  Intake 825 ml  Output 3110 ml  Net -2285 ml      03/01/2022    2:32 PM 02/28/2022    4:11 AM 02/27/2022    4:47 AM  Last 3 Weights  Weight (lbs) 264 lb 5.3 oz 247 lb 5.7 oz 251 lb 15.8 oz  Weight (kg) 119.9 kg 112.2 kg 114.3 kg      Telemetry    N/A - Personally Reviewed  ECG    No new tracings - Personally Reviewed  Physical Exam   GEN: No acute distress.   Neck: ?JVD - exam difficult given habitus Cardiac: RRR, no murmurs, rubs, or gallops.  Respiratory: slightly diminished in bases GI: Soft, nontender, non-distended  MS: 3+ pitting edema to knees bilaterally Neuro:  Nonfocal  Psych: Normal affect   Labs    High Sensitivity Troponin:   Recent Labs  Lab 02/07/22 2030 02/07/22 2255  TROPONINIHS 10 14     Chemistry Recent Labs  Lab 02/27/22 0225 02/28/22 0410 03/01/22 0447 03/03/22 1842 03/04/22 0425  NA 140 140 142 137 136  K 3.5 4.2 4.0 4.2 3.9  CL 101 101 103 101 98  CO2 33* '30 30 28 '$ 30  GLUCOSE 187* 275* 252* 358* 249*  BUN '11 11 16 21 21  '$ CREATININE 0.42* 0.37* 0.35* 0.57* 0.45*  CALCIUM 8.0* 8.0* 8.0* 8.5* 8.7*  MG 1.6* 1.8 1.8  --   --   PROT 5.3* 6.2* 5.8* 5.8* 5.6*  ALBUMIN 2.8* 2.7* 2.3* 2.3* 2.2*  AST '16 16 15 '$ 47* 28  ALT '10 10 10 29 24  '$ ALKPHOS 80 98 97 153* 146*  BILITOT 0.4 0.6 0.3 0.5 0.6  GFRNONAA >60 >60 >60 >60 >60  ANIONGAP '6 9 9 8 8    '$ Lipids No results for input(s): "CHOL", "TRIG", "HDL", "LABVLDL", "LDLCALC", "CHOLHDL" in the last 168 hours.  Hematology Recent Labs  Lab 03/01/22 0447 03/03/22 1000 03/04/22 0425  WBC 7.3 13.4* 10.4  RBC 2.60* 3.35* 2.98*  HGB 8.1* 10.4* 9.4*  HCT 25.9* 32.7* 29.2*  MCV 99.6 97.6 98.0  MCH 31.2 31.0 31.5  MCHC 31.3 31.8 32.2  RDW 15.3 15.6* 15.6*  PLT 262 359 325   Thyroid No results for  input(s): "TSH", "FREET4" in the last 168 hours.  BNP Recent Labs  Lab 03/03/22 1842 03/04/22 0425  BNP 152.8* 105.7*    DDimer No results for input(s): "DDIMER" in the last 168 hours.   Radiology    DG Chest 2 View  Result Date: 03/03/2022 CLINICAL DATA:  200808 Hypoxia 200808 [id number] Abnormal CXR [id number] EXAM: PORTABLE CHEST two views COMPARISON:  03/02/2022 FINDINGS: Cardiac silhouette is prominent. There is pulmonary interstitial prominence with vascular congestion. No focal consolidation. No pneumothorax identified. Large bilateral pleural effusions moderate layering bilateral pleural effusions. Right-sided PICC tip overlies mid SVC. IMPRESSION: Findings suggest CHF. Electronically Signed   By: Sammie Bench M.D.   On: 03/03/2022 12:59   DG CHEST PORT 1 VIEW  Result Date: 03/02/2022 CLINICAL DATA:  Cough EXAM: PORTABLE CHEST 1 VIEW COMPARISON:  Chest x-ray February 11, 2022 FINDINGS: Right upper extremity PICC terminates in the mid SVC. Unchanged cardiomegaly. Stable mediastinal contours. Bibasilar bandlike opacities. Small right pleural effusion. No large pneumothorax. The visualized upper abdomen is unremarkable. No acute osseous abnormality. IMPRESSION: 1. Bibasilar bandlike opacities, likely atelectasis but aspiration/infection could appear similarly in the appropriate clinical context. 2. Small right pleural effusion. Electronically Signed   By: Beryle Flock M.D.   On: 03/02/2022 18:32    Cardiac Studies   Echo pending  Patient Profile     66 y.o. male with a history of GI issues with question of HF. Prolonged [facility] hospitalization with peritonitis requiring exlap. Now in CIR, cardiology consulted for new O2 requirement and edema.  Assessment & Plan    CHF  Hypoxia Hypertension Echo pending today BNP 152 --> 106 Diuresed well on 20 mg IV lasix  --> receiving 40 mg IV this morning K WNL, Mg 1.6 --> 1.8 Overall 4.5 L net negative with 2.6 L urine output  yesterday Need an updated weight BP well controlled on 6.25 mg lopressor BID   Low albumin Protein calorie malnutrition Obesity Orthostatic heart rate Echo as above Edema may be difficult to control given albumin       For questions or updates, please contact [place] Please consult [email or web address] for contact info under        Signed, Ledora Bottcher, PA  03/04/2022, 10:29 AM    Personally seen and examined. Agree with APP above with the following comments: 66 yo M with O2 requirement of unclear etiology Family is concerned that they are getting new diagnoses for no  reason. Goal is to either wean off O2 or exclude a cardiac etiology of volume overload No tachypnea Echo pending today, increase lasix; if no HFrEF will plan for conservative therapy, goal of diuresis to need no O2.  Rudean Haskell, MD [facility] [address] [place], [place] [address] [phone]  1:11 PM

## 2022-03-04 NOTE — Inpatient Diabetes Management (Addendum)
Inpatient Diabetes Program Recommendations  AACE/ADA: New Consensus Statement on Inpatient Glycemic Control  Target Ranges:  Prepandial:   less than 140 mg/dL      Peak postprandial:   less than 180 mg/dL (1-2 hours)      Critically ill patients:  140 - 180 mg/dL    Latest Reference Range & Units 03/03/22 06:08 03/03/22 11:40 03/03/22 16:51 03/03/22 20:46 03/04/22 06:00 03/04/22 11:23  Glucose-Capillary 70 - 99 mg/dL 185 (H) 201 (H) 316 (H) 306 (H) 219 (H) 178 (H)   Review of Glycemic Control  Diabetes history: DM2 Outpatient Diabetes medications: Metformin 500 mg BID, Jardiance 10 mg QAM Current orders for Inpatient glycemic control: Semglee 12 units QHS, Novolog 0-15 units TID with meals, Novolog 0-5 units QHS; Solumedrol 40 mg daily  Inpatient Diabetes Program Recommendations:    Insulin: Noted Semglee increased from 10 to 12 units QHS today.  If steroids are continued, please consider ordering Novolog 4 units TID with meals for meal coverage if patient eats at least 50% of meals.  Thanks, Barnie Alderman, RN, MSN, [place] Diabetes Coordinator Inpatient Diabetes Program [phone] (Team Pager from 8am to [place])

## 2022-03-04 NOTE — Progress Notes (Signed)
Inpatient Rehabilitation Care Coordinator Assessment and Plan Patient Details  Name: Richard [name] MRN: [id number] Date of Birth: 1955-07-11  Today's Date: 03/04/2022  Hospital Problems: Principal Problem:   Debility Active Problems:   ABLA (acute blood loss anemia)   Crohn's disease of large intestine with other complication [facility])  Past Medical History:  Past Medical History:  Diagnosis Date   Adult ADHD    Allergic rhinitis    Anxiety    claustrophobic   Bronchopneumonia 11/03/2013   Colon polyps 2012 and 2013   All hyperplastic except one adenomatous w/out high grade dysplasia (recall 2023 per Dr. Deatra Ina)   COVID-19 virus infection 09/29/2020   Elevated transaminase level 2017   Viral hep screening neg.  Suspect fatty liver.   Hyperlipidemia    Hypertension    Obesity    OSA on CPAP    Severe.  CPAP 10 cm H2O.  Follows Dr. Ander Slade.   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 [name] to do surg   Pneumonia    Seborrheic dermatitis of scalp    and face: hydrocort 2% cream bid prn to face, and fluocinonide 0.5% sol'n to scalp bid prn per Dr. Allyson Sabal (04/2014)   Secondary male hypogonadism 04/2021   Type 2 diabetes mellitus with microalbuminuria ([place]) DM dx-09/27/2015. Microalb 2020   Past Surgical History:  Past Surgical History:  Procedure Laterality Date   COLONOSCOPY W/ POLYPECTOMY  04/2010; 10/2011   Initial screening TCS showed 6 polyps (one adenomatous).  Pt says he returned for repeat TCS 10/2011 showed one hyperplastic polyp: Recall 10 yrs per Dr. Samuella Cota SIGMOIDOSCOPY N/A 02/25/2022   Procedure: FLEXIBLE SIGMOIDOSCOPY;  Surgeon: Irving Copas., MD;  Location: [facility];  Service: Gastroenterology;  Laterality: N/A;   HEMOSTASIS CONTROL  02/25/2022   Procedure: HEMOSTASIS CONTROL;  Surgeon: Rush Landmark Telford Nab., MD;  Location: [facility];  Service: Gastroenterology;;   INCISION AND DRAINAGE ABSCESS N/A 11/07/2021    Procedure: Incision and drainage of scrotal abscess;  Surgeon: Leighton Ruff, MD;  Location: [facility];  Service: General;  Laterality: N/A;   LAPAROTOMY N/A 02/07/2022   Procedure: EXPLORATORY LAPAROTOMY, COLON RESECTION, OSTOMY;  Surgeon: Leighton Ruff, MD;  Location: [facility];  Service: General;  Laterality: N/A;   MOUTH SURGERY     Social History:  reports that he quit smoking about 19 years ago. His smoking use included cigarettes. He smoked an average of 1 pack per day. He has never used smokeless tobacco. He reports current alcohol use of about 7.0 standard drinks of alcohol per week. He reports that he does not use drugs.  Family / Support Systems Marital Status: Married Patient Roles: Spouse, Other (Comment) (Business Owner) Spouse/Significant Other: Richard [name] [phone] Other Supports: Friends and church members Anticipated Caregiver: wife Ability/Limitations of Caregiver: Wife works from home can assist Caregiver Availability: 24/7 Family Dynamics: Close with family and freinds he is rally wanting to get out of here and get back to work. Needs to be mobile and doing better than he is now  Social History Preferred language: English Religion:  Cultural Background: No issues Education: Secretary/administrator educated Health Literacy - How often do you need to have someone help you when you read instructions, pamphlets, or other written material from your doctor or pharmacy?: Never Writes: Yes Employment Status: Employed Name of Employer: Patent attorney Return to Work Plans: Plans to return and feels needs to be there now since has missed 5 weeks now since being in the hospital Legal  History/Current Legal Issues: No issues Guardian/Conservator: None-according to MD pt is capable of making his own decisions while here. Wife plans to be here daily and is involved   Abuse/Neglect Abuse/Neglect Assessment Can Be Completed: Yes Physical Abuse: Denies Verbal Abuse: Denies Sexual Abuse:  Denies Exploitation of patient/patient's resources: Denies Self-Neglect: Denies  Patient response to: Social Isolation - How often do you feel lonely or isolated from those around you?: Never  Emotional Status Pt's affect, behavior and adjustment status: Pt is motivated and wanting to get home and back to work. He has been independent in the past and even with health issues has done for himself. His wife is a string support and will assist. Pt does not want her to have too Recent Psychosocial Issues: other health issues was managing until this event Psychiatric History: History of ADHD/anxiety takes medications for this but would benefit from seeing neuro-psych while here. Will place him on the list to be seen Substance Abuse History: No issues  Patient / Family Perceptions, Expectations & Goals Pt/Family understanding of illness & functional limitations: Pt and wife can explain his health issues and procedures he has had while in the hospital. Pt is wanting to get out of the hospital. His wife is willing to learn what she needs to to assist him. Premorbid pt/family roles/activities: husband, owner, friend, church member, etc Anticipated changes in roles/activities/participation: resume Pt/family expectations/goals: Pt states: " I have been in a hospital for five weeks and need to get home and back to my business."  Wife states: " He needs to be ready and will do what she can to help him."  [employer]: None Premorbid Home Care/DME Agencies: None Transportation available at discharge: pt and wife Is the patient able to respond to transportation needs?: Yes In the past 12 months, has lack of transportation kept you from medical appointments or from getting medications?: No In the past 12 months, has lack of transportation kept you from meetings, work, or from getting things needed for daily living?: No Resource referrals recommended: Neuropsychology  Discharge  Planning Living Arrangements: Spouse/significant other Support Systems: Spouse/significant other, Friends/neighbors, Social worker community Type of Residence: Private residence Insurance underwriter Resources: Multimedia programmer (specify) Psychologist, counselling) Financial Resources: Employment, Secondary school teacher Screen Referred: No Living Expenses: Own Money Management: Patient, Spouse Does the patient have any problems obtaining your medications?: No Home Management: Wife Patient/Family Preliminary Plans: Return homre with wife who does work from home and can assist him if needed. Pt is aware of team conference tomorrow and will set target discharge date for home. Care Coordinator Barriers to Discharge: Insurance for SNF coverage Care Coordinator Anticipated Follow Up Needs: HH/OP  Clinical Impression Pleasant but wanting to go home and get back to his business. His wife is supportive and present in his room and plans to be here daily. Have placed on neuro-psych list and will assist with discharge needs.  Elease Hashimoto 03/04/2022, 10:03 AM

## 2022-03-04 NOTE — Progress Notes (Signed)
Attempted Echocardiogram, patient was taken to Endoscopy.

## 2022-03-04 NOTE — Consult Note (Signed)
[place] Nurse ostomy follow up Stoma type/location: LMQ colostomy, less edematous today, will implement 2 3/4" pouch with barrier ring Stomal assessment/size: 2 1/4" budded, some separation at the mucocutaneous junction, stitches noted.  Peristomal assessment:  improved, per wife Treatment options for stomal/peristomal skin: Cleanse with soap and water, barrier ring, 2 3/4" pouch Output soft brown stool Ostomy pouching: 2pc. 2 3/4" pouch  This will be easier to manage than the 4" pouch he was requiring.   Education provided: Pouch change performed.  Measured stoma and explained to wife and patient rationale for switching to 2 3/4" pouch.  Barrier ring applied.  Assembled pouch and barrier and applied to skin.  Wife will practice again Thursday. She has not been emptying. I have encouraged her to empty when staff come into room so she can gain confidence in this.  Enrolled patient in [facility] Discharge program: Not yet.  Will follow.  Estrellita Ludwig MSN, RN, FNP-BC [facility] Wound, Ostomy, Continence Nurse [facility] [phone] Pager [phone]

## 2022-03-04 NOTE — Op Note (Addendum)
[facility] Patient Name: Richard [name] Procedure Date : 03/04/2022 MRN: [id number] Attending MD: Gerrit Heck , MD, [id number] Date of Birth: 1956-01-08 CSN: [id number] Age: 66 Admit Type: Inpatient Procedure:                Flexible Sigmoidoscopy with treatment of active                            bleeding Indications:              Hematochezia Providers:                Gerrit Heck, MD, Doristine Johns, RN, Fransico Setters                            [place], Technician Referring MD:              Medicines:                None Complications:            No immediate complications. Estimated Blood Loss:     Estimated blood loss: none. Procedure:                Pre-Anesthesia Assessment:                           - Prior to the procedure, a History and Physical                            was performed, and patient medications and                            allergies were reviewed. The patient's tolerance of                            previous anesthesia was also reviewed. The risks                            and benefits of the procedure and the sedation                            options and risks were discussed with the patient.                            All questions were answered, and informed consent                            was obtained. Prior Anticoagulants: The patient has                            taken no anticoagulant or antiplatelet agents. ASA                            Grade Assessment: III - A patient with severe                            systemic disease. After reviewing the  risks and                            benefits, the patient was deemed in satisfactory                            condition to undergo the procedure.                           After obtaining informed consent, the scope was                            passed under direct vision. The GIF-H190 ([id number])                            Olympus endoscope was introduced through the anus                             and advanced to the the rectosigmoid junction. The                            flexible sigmoidoscopy was accomplished without                            difficulty. The patient tolerated the procedure                            well. The quality of the bowel preparation was                            adequate. Scope In: 2:44:54 PM Scope Out: 3:03:33 PM Total Procedure Duration: 0 hours 18 minutes 39 seconds  Findings:      There was blood on the perianal exam and tenderness with digital exam.      Medium sized, non-bleeding rectal varices were found.      Multiple ulcers were found in the distal rectum, with the largest       approximately 10 mm in size. One ulcer extended to the anal       verge/dentate line. There was blood in the rectal vault which was       lavaged. No active bleeding was present. Stigmata of recent bleeding       were present on a few of the ulcer beds. Interestingly, there was       adherent clear and pigmented clot matrices over several ulcer beds,       consistent with previous hemotstatic therapy. The largest of these       demonstrated mild oozing with clot manipulation. For hemostasis,       hemostatic PuraSTAT gell was deployed into the ulcer beds (3 mL in total       volume). There was no bleeding at the end of the procedure.      The mucosa in the recto-sigmoid and distal sigmoid colon was largely       normal appearing. This was overall significantly improved from the       colonoscopy in November.      Non-bleeding internal hemorrhoids were found during retroflexion. The  hemorrhoids were Grade II (internal hemorrhoids that prolapse but reduce       spontaneously). Impression:               - Non-bleeding rectal varices.                           - Multiple ulcers in the distal rectum. Hemostatic                            PuraSTAT gell was applied.                           - Normal mucosa in the recto-sigmoid colon.                            - Non-bleeding internal hemorrhoids.                           - No specimens collected. Recommendation:           - Return patient to hospital ward for ongoing care.                           - Advance diet as tolerated.                           - CT abdomen/pelvis this evening.                           - TTE as planned by the inpatient Cardiology                            service.                           - Will tentatively plan for colonoscopy via ostomy                            on 03/06/2022, pending Cardiopulmonary status. As                            such, plan for clears tomorrow, bowel prep tomorrow                            evening.                           - Continue serial CBC checks with additional blood                            products as needed per protocol.                           - Resume IV steroids.                           - I update the patient's wife by phone immediately  following the procedure as well.                           - Inpatient GI service will continue to follow. Procedure Code(s):        --- Professional ---                           [phone], 70, Sigmoidoscopy, flexible; with control of                            bleeding, any method Diagnosis Code(s):        --- Professional ---                           K62.6, Ulcer of anus and rectum                           K64.1, Second degree hemorrhoids                           K92.1, Melena (includes Hematochezia) CPT copyright 2022 [employer]. All rights reserved. The codes documented in this report are preliminary and upon coder review may  be revised to meet current compliance requirements. Gerrit Heck, MD 03/04/2022 3:24:10 PM Number of Addenda: 0

## 2022-03-04 NOTE — Interval H&P Note (Signed)
History and Physical Interval Note:  Plan for emergent, unsedated flexible sigmoidoscopy for therapeutic intent.  03/04/2022 2:31 PM  Richard [name]  has presented today for surgery, with the diagnosis of hematochezia.  The various methods of treatment have been discussed with the patient and family. After consideration of risks, benefits and other options for treatment, the patient has consented to  Procedure(s): FLEXIBLE SIGMOIDOSCOPY (N/A) as a surgical intervention.  The patient's history has been reviewed, patient examined, no change in status, stable for surgery.  I have reviewed the patient's chart and labs.  Questions were answered to the patient's satisfaction.     Dominic Pea Adean Milosevic

## 2022-03-04 NOTE — Progress Notes (Addendum)
PROGRESS NOTE   Subjective/Complaints:  Discussed bloodwork with pt and family  Appreciate GI, Cardiology and IR consults   Review of Systems  Constitutional:  Negative for chills and fever.  HENT:  Positive for congestion.   Eyes:  Negative for double vision.  Respiratory:  Negative for shortness of breath.   Cardiovascular:  Negative for chest pain.  Gastrointestinal:  Negative for nausea and vomiting.  Genitourinary: Negative.   Neurological:  Positive for weakness.  Psychiatric/Behavioral:  The patient is nervous/anxious.     Objective:   DG Chest 2 View  Result Date: 03/03/2022 CLINICAL DATA:  200808 Hypoxia 200808 [id number] Abnormal CXR [id number] EXAM: PORTABLE CHEST two views COMPARISON:  03/02/2022 FINDINGS: Cardiac silhouette is prominent. There is pulmonary interstitial prominence with vascular congestion. No focal consolidation. No pneumothorax identified. Large bilateral pleural effusions moderate layering bilateral pleural effusions. Right-sided PICC tip overlies mid SVC. IMPRESSION: Findings suggest CHF. Electronically Signed   By: Sammie Bench M.D.   On: 03/03/2022 12:59   DG CHEST PORT 1 VIEW  Result Date: 03/02/2022 CLINICAL DATA:  Cough EXAM: PORTABLE CHEST 1 VIEW COMPARISON:  Chest x-ray February 11, 2022 FINDINGS: Right upper extremity PICC terminates in the mid SVC. Unchanged cardiomegaly. Stable mediastinal contours. Bibasilar bandlike opacities. Small right pleural effusion. No large pneumothorax. The visualized upper abdomen is unremarkable. No acute osseous abnormality. IMPRESSION: 1. Bibasilar bandlike opacities, likely atelectasis but aspiration/infection could appear similarly in the appropriate clinical context. 2. Small right pleural effusion. Electronically Signed   By: Beryle Flock M.D.   On: 03/02/2022 18:32   Recent Labs    03/03/22 1000 03/04/22 0425  WBC 13.4* 10.4  HGB 10.4* 9.4*  HCT  32.7* 29.2*  PLT 359 325    Recent Labs    03/03/22 1842 03/04/22 0425  NA 137 136  K 4.2 3.9  CL 101 98  CO2 28 30  GLUCOSE 358* 249*  BUN 21 21  CREATININE 0.57* 0.45*  CALCIUM 8.5* 8.7*     Intake/Output Summary (Last 24 hours) at 03/04/2022 0859 Last data filed at 03/04/2022 6384 Gross per 24 hour  Intake 585 ml  Output 3110 ml  Net -2525 ml         Physical Exam: Vital Signs Blood pressure 126/74, pulse 96, temperature 98.7 F (37.1 C), temperature source Oral, resp. rate 16, height '5\' 9"'$  (1.753 m), weight 119.9 kg, SpO2 96 %.  General: No acute distress Mood and affect are appropriate Heart: Regular rate and rhythm no rubs murmurs or extra sounds Lungs: Clear to auscultation, breathing unlabored, no rales or wheezes  Extremities: No clubbing, cyanosis, or edema   Abdomen: Soft, mildly-tender, mildly-distended, bowel sounds positive.Midline incision with dressing intact. LLQ drain with small amount of purulent material. Colostomy with liquid stool.  Extremities: No clubbing, cyanosis, 3+ LE edema at mid leg and below  Psych: Pt's affect is appropriate. Pt is cooperative Skin: warm and dry Neuro:  anxious  Musculoskeletal: Alert and oriented x 3. Normal insight and awareness. Intact Memory. Normal language and speech. Cranial nerve exam unremarkable. UE 5/5. LE 3+/5 prox to 4/5 distally. No focal sensory changes.   Assessment/Plan: 1. Functional  deficits which require 3+ hours per day of interdisciplinary therapy in a comprehensive inpatient rehab setting. Physiatrist is providing close team supervision and 24 hour management of active medical problems listed below. Physiatrist and rehab team continue to assess barriers to discharge/monitor patient progress toward functional and medical goals  Care Tool:  Bathing    Body parts bathed by patient: Right arm, Left arm, Chest, Abdomen, Face (simulated task at bed level)   Body parts bathed by helper: Front  perineal area, Left upper leg, Right upper leg, Right lower leg, Left lower leg, Buttocks     Bathing assist Assist Level: Dependent - Patient 0% (Patient presents as dependent with LB functional seated EOB)     Upper Body Dressing/Undressing Upper body dressing   What is the patient wearing?: Button up shirt    Upper body assist Assist Level: Moderate Assistance - Patient 50 - 74%    Lower Body Dressing/Undressing Lower body dressing      What is the patient wearing?: Pants     Lower body assist Assist for lower body dressing: Dependent - Patient 0%     Toileting Toileting    Toileting assist Assist for toileting: Dependent - Patient 0% (cather and colosteomy bag)     Transfers Chair/bed transfer  Transfers assist  Chair/bed transfer activity did not occur: Refused (patient requires mecanical assistance using the stedy)  Chair/bed transfer assist level: 2 Helpers (and [place])     Data processing manager   Ambulation assist   Ambulation activity did not occur: Safety/medical concerns          Walk 10 feet activity   Assist  Walk 10 feet activity did not occur: Safety/medical concerns        Walk 50 feet activity   Assist Walk 50 feet with 2 turns activity did not occur: Safety/medical concerns         Walk 150 feet activity   Assist Walk 150 feet activity did not occur: Safety/medical concerns         Walk 10 feet on uneven surface  activity   Assist Walk 10 feet on uneven surfaces activity did not occur: Safety/medical concerns         Wheelchair     Assist Is the patient using a wheelchair?: Yes Type of Wheelchair: Manual    Wheelchair assist level: Dependent - Patient 0%      Wheelchair 50 feet with 2 turns activity    Assist        Assist Level: Dependent - Patient 0%   Wheelchair 150 feet activity     Assist      Assist Level: Dependent - Patient 0%   Blood pressure 126/74, pulse 96,  temperature 98.7 F (37.1 C), temperature source Oral, resp. rate 16, height '5\' 9"'$  (1.753 m), weight 119.9 kg, SpO2 96 %.  Medical Problem List and Plan: 1. Functional deficits secondary to debility after acute peritonitis and multiple sequelae              -patient may not yet shower             -ELOS/Goals: 10-12 days, supervision  -Continue CIR, PT/OT  -Plan for colonoscopy tomorrow 2.  Antithrombotics: -DVT/anticoagulation:  Mechanical: Sequential compression devices, below knee Bilateral lower extremities as high risk for rebleeding.              -antiplatelet therapy: N/A 3. Pain Management: Oxycodone prn.  4. Mood/Behavior/Sleep: LCSW to follow for evaluation and support.             --  trazodone prn for insomnia.              -antipsychotic agents: N/a 5. Neuropsych/cognition: This patient is capable of making decisions on his own behalf. 6. Skin/Wound Care: Pressure relief measures.              --wet to dry dressing changed to once a day (per patient request). -wound healing well. May be able to change to a transitional dressing soon.   7. Fluids/Electrolytes/Nutrition: Monitor I/O. Encourage intake.              --supplements with meals--change to ensure Max as BS trending up w/steroids on board             --Add beneprotein/juven to promote healing.  8. Colon perforation s/p resection/post op abscess: Transitioned to Ceftriaxone 12/15 and on Flagyl per Dr. Juleen China --continue IV antibiotics with drain in place.  --contact ID for input after drain removed.  -Surgery following appreciate assistance 9. Hypomagnesemia: Has required frequent IV supplementation. Recheck in am.  --Oral supplement held due to diarrhea? 10. Crohn's disease: Started on Solumedrol on 12/13 with plans for colonoscopy this weekend potentially.              --Reglan bid for abdominal distension. Fibercon for high output stool/diarrhea  -GI and surgery following appreciate assistance, GI planning  colonoscopy  11. Tachycardia: Due to deconditioning. On low dose metoprolol due to orthostatic symptoms.              --monitor for symptoms with increase in activity.              --check orthostatic vitals.  12. ABLA:     -12/18 Hgb  at 9.4, has been running ~8, on reading of 10.4, may have hemoconcentration with diuresis  13. T2DM: Hgb A1C- 6.6-->6.1. Was on metformin and Jardiance at home.              --Monitor BS ac/hs and use SSI for elevated BS. Hold Jardiance due to infection.              --Start Semglee 15 units while on steroids per Diabetes Coordinator recs?  -12/16 start 5 units HS  -12/17 Semglee increased to 8 units CBG (last 3)  Recent Labs    03/03/22 1651 03/03/22 2046 03/04/22 0600  GLUCAP 316* 306* 219*   Semglee increased to 12U  14. Severe OSA: Encourage CPAP use.   15. Fluid overload: Treated with IV lasix 12/10-12/13.  --Monitor I/O and daily weights.  Filed Weights   03/01/22 1432  Weight: 119.9 kg  -12/16 give '20mg'$  IV lasix today  16. ADHD   -restart methylphenidate, discussed dose/medication options with pharmacy, pt advised to bring in his home formulation of methyphenidate   17. Chest congestion CXR completed- bibasilar opacities , likely atelectasis -Dicussed use of incentive spirometer -Started mucinex, symptoms improved  Late Addendum Repeat CXR with pleural effusions indicating CHF, Cardiology consulted, started IV lasix 18.  Morbid obesity LOS: 3 days A FACE TO FACE EVALUATION WAS PERFORMED  Charlett Blake 03/04/2022, 8:59 AM

## 2022-03-04 NOTE — Telephone Encounter (Signed)
Patient refill request.  [facility]  methylphenidate (METADATE CD) 20 MG CR capsule

## 2022-03-04 NOTE — H&P (View-Only) (Signed)
Daily Rounding Note  03/04/2022, 1:14 PM  LOS: 3 days   SUBJECTIVE:   Chief complaint:    GI bleeding in pt s/p descending/simoid colectomy, transverse colosotomy 02/07/22.    Was doing well earlier in the day, but then sudden onset BRBPR this afternoon.  No blood via ostomy.  Did have lunch.   02/04/2022 outpt colonoscopy: Diffuse colitis with deep ulcerations, friable mucosa throughout the entire colon.  Initiated on prednisone after the procedure.  Path: patchy acute inflammation and focal mild features of chronicity such as mild crypt distortion and thickening of the muscularis. No granulomas. Findings nonspecific and can be seen in IBD, drugs and infectious etiologies. 02/07/2022 descending/sigmoid colectomy, transverse colostomy after presenting with abdominal perforation 02/25/22 sigmoidoscopy: deep rectal ulcers w stigmata of recent bleeding.  Treated w Hemospray.  Non-bleeding rectal varices.   02/26/22 IR drain placement of large LLQ abscess.  Growing E coli, bacteroides.   On Rocephin/Flagyl (day 4), IV solumedrol (day 3)    OBJECTIVE:         Vital signs in last 24 hours:    Temp:  [98.7 F (37.1 C)-99.3 F (37.4 C)] 99.3 F (37.4 C) (12/18 1311) Pulse Rate:  [92-110] 110 (12/18 1311) Resp:  [16-20] 17 (12/18 1311) BP: (116-138)/(69-74) 118/71 (12/18 1311) SpO2:  [93 %-97 %] 94 % (12/18 1311) Last BM Date : 03/04/22 (ostomy emptied/chnged) Filed Weights   03/01/22 1432  Weight: 119.9 kg   General: AAOx3, well conversive Abdomen: Ostomy in place, nondistended Rectal: Blood on padding, but no active bleeding at time of exam  Intake/Output from previous day: 12/17 0701 - 12/18 0700 In: 1065 [P.O.:1050; I.V.:10] Out: 3110 [Urine:2600; Drains:10; Stool:500]  Intake/Output this shift: Total I/O In: 250 [P.O.:240; I.V.:10] Out: 400 [Urine:400]  Lab Results: Recent Labs    03/03/22 1000 03/04/22 0425   WBC 13.4* 10.4  HGB 10.4* 9.4*  HCT 32.7* 29.2*  PLT 359 325   BMET Recent Labs    03/03/22 1842 03/04/22 0425  NA 137 136  K 4.2 3.9  CL 101 98  CO2 28 30  GLUCOSE 358* 249*  BUN 21 21  CREATININE 0.57* 0.45*  CALCIUM 8.5* 8.7*   LFT Recent Labs    03/03/22 1842 03/04/22 0425  PROT 5.8* 5.6*  ALBUMIN 2.3* 2.2*  AST 47* 28  ALT 29 24  ALKPHOS 153* 146*  BILITOT 0.5 0.6   PT/INR No results for input(s): "LABPROT", "INR" in the last 72 hours. Hepatitis Panel No results for input(s): "HEPBSAG", "HCVAB", "HEPAIGM", "HEPBIGM" in the last 72 hours.  Studies/Results: DG Chest 2 View  Result Date: 03/03/2022 CLINICAL DATA:  200808 Hypoxia 200808 [id number] Abnormal CXR [id number] EXAM: PORTABLE CHEST two views COMPARISON:  03/02/2022 FINDINGS: Cardiac silhouette is prominent. There is pulmonary interstitial prominence with vascular congestion. No focal consolidation. No pneumothorax identified. Large bilateral pleural effusions moderate layering bilateral pleural effusions. Right-sided PICC tip overlies mid SVC. IMPRESSION: Findings suggest CHF. Electronically Signed   By: Sammie Bench M.D.   On: 03/03/2022 12:59   DG CHEST PORT 1 VIEW  Result Date: 03/02/2022 CLINICAL DATA:  Cough EXAM: PORTABLE CHEST 1 VIEW COMPARISON:  Chest x-ray February 11, 2022 FINDINGS: Right upper extremity PICC terminates in the mid SVC. Unchanged cardiomegaly. Stable mediastinal contours. Bibasilar bandlike opacities. Small right pleural effusion. No large pneumothorax. The visualized upper abdomen is unremarkable. No acute osseous abnormality. IMPRESSION: 1. Bibasilar bandlike opacities, likely atelectasis but aspiration/infection  could appear similarly in the appropriate clinical context. 2. Small right pleural effusion. Electronically Signed   By: Beryle Flock M.D.   On: 03/02/2022 18:32    Scheduled Meds:  (feeding supplement) PROSource Plus  30 mL Oral TID WC   ascorbic acid  500 mg Oral BID    atorvastatin  40 mg Oral Daily   Chlorhexidine Gluconate Cloth  6 each Topical BID   ferrous sulfate  325 mg Oral BID WC   guaiFENesin  600 mg Oral BID   insulin aspart  0-15 Units Subcutaneous TID WC   insulin aspart  0-5 Units Subcutaneous QHS   insulin glargine-yfgn  12 Units Subcutaneous QHS   lip balm   Topical BID   methylPREDNISolone (SOLU-MEDROL) injection  40 mg Intravenous Daily   metoprolol tartrate  6.25 mg Oral BID   multivitamin with minerals  1 tablet Oral Daily   nutrition supplement (JUVEN)  1 packet Oral BID WC   pantoprazole  40 mg Oral Daily   polycarbophil  625 mg Oral BID   Ensure Max Protein  11 oz Oral BID   sodium chloride flush  10-40 mL Intracatheter Q12H   thiamine  100 mg Oral Daily   zinc sulfate  220 mg Oral Daily   Continuous Infusions:  sodium chloride 10 mL/hr at 03/03/22 1712   cefTRIAXone (ROCEPHIN)  IV 2 g (03/03/22 1842)   metroNIDAZOLE 500 mg (03/04/22 0520)   PRN Meds:.sodium chloride, acetaminophen, alum & mag hydroxide-simeth, bisacodyl, diphenhydrAMINE, guaiFENesin-dextromethorphan, hydrocortisone cream, hydrOXYzine, magic mouthwash, menthol-cetylpyridinium, methylphenidate, naphazoline-glycerin, ondansetron (ZOFRAN) IV, mouth rinse, phenol, polyethylene glycol, prochlorperazine **OR** prochlorperazine **OR** prochlorperazine, simethicone, sodium chloride, sodium chloride flush, traZODone  ASSESSMENT AND PLAN:   1) Crohn's Disease: Colonoscopy in November with deeply cratered ulcers throughout the colon, with decreased inflammation in the rectum.  Subsequently admitted with sigmoid perforation requiring emergency ex lap with descending/sigmoid resection and transverse colostomy.  Has had a complex hospital course, to include acute rectal bleeding on 02/25/2022 from deep cratered rectal ulceration, treated with PuroStat hemostatic spray with resolution.  He has since been started on Solu-Medrol.  Had initially planned for bowel prep this  evening with colonoscopy via ostomy tomorrow.  However, in light of acute rebleeding event today, plan for emergent flexible sigmoidoscopy for therapeutic intent.  He did have lunch 2 hours prior, so discussed potentially doing this as an unsedated procedure as was done 1 week ago and well-tolerated by the patient.  Unfortunately, this would mean 2 separate endoscopic procedures, as we would still want to do the colonoscopy at some point in the near future to evaluate for mucosal response to therapy in the right/mid colon. - Stat CBC now - Plan for emergent flexible sigmoidoscopy for therapeutic intent today - Continue IV Solu-Medrol (day 3) - Continue Flagyl (day 4) - Eventual plan for initiating Remicade - Intermediate metabolizer on TPMT, so no plan to initiate immunomodulators  2) LLQ abscess S/p IR drainage on 02/26/2022 with cultures positive for E. coli and Bacteroides.  Started on Rocephin - Repeat CT to evaluate for improvement, particular with purulent drainage noted yesterday -Continue ABX  3) Ascites Persistent peritoneal fluid 2/2 recent surgery and likely component of significant colonic inflammation.  Paracentesis on 02/26/22 negative for SBP  4) Hypoxia New O2 requirement.  CXR on 12/17 with vascular congestion and large bilateral pleural effusions.  Cardiology service consulted and diuresed with IV Lasix on 12/17, and another 40 mg IV this morning - Cardiology planning on  TTE - Volume management per Cardiology service   Gerrit Heck, DO, [facility]

## 2022-03-04 NOTE — Progress Notes (Signed)
Occupational Therapy Session Note  Patient Details  Name: Richard [name] MRN: [id number] Date of Birth: [birth year]  Today's Date: 03/04/2022 OT Individual Time: 800-900 1st Session, 1400-1415 2nd Session  OT Individual Time Calculation (min): 60 min, 15 min (missed 45 min d/t GI procedure)   Short Term Goals: Week 1:  OT Short Term Goal 1 (Week 1): The pt will complete UB BADL related task in dressing with MinA OT Short Term Goal 2 (Week 1): The pt will complete BADL related task in dressing LB with ModA using AE for >ease at 95% safe OT Short Term Goal 3 (Week 1): The pt will complete functional transfers using the stedy x1 coming from sit to stand with MinA  at 95% safe OT Short Term Goal 4 (Week 1): The pt will tolerate >30 minutes of activity, with rest breaks as needed at 95%safe OT Short Term Goal 5 (Week 1): The pt will remain at up in the chair for 1 hour to improve endurance at 95% safe  Skilled Therapeutic Interventions/Progress Updates:  1st Session:  Pt seen for am skilled OT session and was rec'd bed level with wife present bedside. Pt on O2 via [place] and reported no pain just concern re: GI procedure planned for colonoscopy later day or tomorrow. OT provided support for LB bathing bed level due to need for total assist currently. Pt still with abdominal bandages and drain as well as colostomy with distended abdomen making LB self care difficult. Pt able to roll and sit EOB with mod A for LB management. EOB with close S. Stedy transfer sit to and from stand with 3 attempts and mod a. Transfer to TIS with 2 persons for safety. Sink side UB bathing mod a, grooming with CGA and set up and gown donning with mod a. Left pt in the care of nursing for further cares.   2nd Session:  Pt in bed upon OT arrival with wife bedside. Pt reports they are taking him shortly for colonoscopy. OT able to instruct in breathing strategies as well as issue and train in foam cube resistance for hand  strength and overall CDP management. Pt also educated to utilize cube for stress relief as pt going through multiple medical issues limiting his function. Pt able to teach back both breathing and foam cube therex. Staff for procedure arrived and OT left pt in care of team missing 45 minutes of scheduled therapy time.      Therapy Documentation Precautions:  Precautions Precautions: Other (comment) (pt  is extremly anxious) Precaution Comments: L colostomy, monitor o2/HR Other Brace: binder was not in place Restrictions Weight Bearing Restrictions: No   Therapy/Group: Individual Therapy  Barnabas Lister 03/04/2022, 7:53 AM

## 2022-03-04 NOTE — Progress Notes (Addendum)
Daily Rounding Note  03/04/2022, 1:14 PM  LOS: 3 days   SUBJECTIVE:   Chief complaint:    GI bleeding in pt s/p descending/simoid colectomy, transverse colosotomy 02/07/22.    Was doing well earlier in the day, but then sudden onset BRBPR this afternoon.  No blood via ostomy.  Did have lunch.   02/04/2022 outpt colonoscopy: Diffuse colitis with deep ulcerations, friable mucosa throughout the entire colon.  Initiated on prednisone after the procedure.  Path: patchy acute inflammation and focal mild features of chronicity such as mild crypt distortion and thickening of the muscularis. No granulomas. Findings nonspecific and can be seen in IBD, drugs and infectious etiologies. 02/07/2022 descending/sigmoid colectomy, transverse colostomy after presenting with abdominal perforation 02/25/22 sigmoidoscopy: deep rectal ulcers w stigmata of recent bleeding.  Treated w Hemospray.  Non-bleeding rectal varices.   02/26/22 IR drain placement of large LLQ abscess.  Growing E coli, bacteroides.   On Rocephin/Flagyl (day 4), IV solumedrol (day 3)    OBJECTIVE:         Vital signs in last 24 hours:    Temp:  [98.7 F (37.1 C)-99.3 F (37.4 C)] 99.3 F (37.4 C) (12/18 1311) Pulse Rate:  [92-110] 110 (12/18 1311) Resp:  [16-20] 17 (12/18 1311) BP: (116-138)/(69-74) 118/71 (12/18 1311) SpO2:  [93 %-97 %] 94 % (12/18 1311) Last BM Date : 03/04/22 (ostomy emptied/chnged) Filed Weights   03/01/22 1432  Weight: 119.9 kg   General: AAOx3, well conversive Abdomen: Ostomy in place, nondistended Rectal: Blood on padding, but no active bleeding at time of exam  Intake/Output from previous day: 12/17 0701 - 12/18 0700 In: 1065 [P.O.:1050; I.V.:10] Out: 3110 [Urine:2600; Drains:10; Stool:500]  Intake/Output this shift: Total I/O In: 250 [P.O.:240; I.V.:10] Out: 400 [Urine:400]  Lab Results: Recent Labs    03/03/22 1000 03/04/22 0425   WBC 13.4* 10.4  HGB 10.4* 9.4*  HCT 32.7* 29.2*  PLT 359 325   BMET Recent Labs    03/03/22 1842 03/04/22 0425  NA 137 136  K 4.2 3.9  CL 101 98  CO2 28 30  GLUCOSE 358* 249*  BUN 21 21  CREATININE 0.57* 0.45*  CALCIUM 8.5* 8.7*   LFT Recent Labs    03/03/22 1842 03/04/22 0425  PROT 5.8* 5.6*  ALBUMIN 2.3* 2.2*  AST 47* 28  ALT 29 24  ALKPHOS 153* 146*  BILITOT 0.5 0.6   PT/INR No results for input(s): "LABPROT", "INR" in the last 72 hours. Hepatitis Panel No results for input(s): "HEPBSAG", "HCVAB", "HEPAIGM", "HEPBIGM" in the last 72 hours.  Studies/Results: DG Chest 2 View  Result Date: 03/03/2022 CLINICAL DATA:  200808 Hypoxia 200808 [id number] Abnormal CXR [id number] EXAM: PORTABLE CHEST two views COMPARISON:  03/02/2022 FINDINGS: Cardiac silhouette is prominent. There is pulmonary interstitial prominence with vascular congestion. No focal consolidation. No pneumothorax identified. Large bilateral pleural effusions moderate layering bilateral pleural effusions. Right-sided PICC tip overlies mid SVC. IMPRESSION: Findings suggest CHF. Electronically Signed   By: Sammie Bench M.D.   On: 03/03/2022 12:59   DG CHEST PORT 1 VIEW  Result Date: 03/02/2022 CLINICAL DATA:  Cough EXAM: PORTABLE CHEST 1 VIEW COMPARISON:  Chest x-ray February 11, 2022 FINDINGS: Right upper extremity PICC terminates in the mid SVC. Unchanged cardiomegaly. Stable mediastinal contours. Bibasilar bandlike opacities. Small right pleural effusion. No large pneumothorax. The visualized upper abdomen is unremarkable. No acute osseous abnormality. IMPRESSION: 1. Bibasilar bandlike opacities, likely atelectasis but aspiration/infection  could appear similarly in the appropriate clinical context. 2. Small right pleural effusion. Electronically Signed   By: Beryle Flock M.D.   On: 03/02/2022 18:32    Scheduled Meds:  (feeding supplement) PROSource Plus  30 mL Oral TID WC   ascorbic acid  500 mg Oral BID    atorvastatin  40 mg Oral Daily   Chlorhexidine Gluconate Cloth  6 each Topical BID   ferrous sulfate  325 mg Oral BID WC   guaiFENesin  600 mg Oral BID   insulin aspart  0-15 Units Subcutaneous TID WC   insulin aspart  0-5 Units Subcutaneous QHS   insulin glargine-yfgn  12 Units Subcutaneous QHS   lip balm   Topical BID   methylPREDNISolone (SOLU-MEDROL) injection  40 mg Intravenous Daily   metoprolol tartrate  6.25 mg Oral BID   multivitamin with minerals  1 tablet Oral Daily   nutrition supplement (JUVEN)  1 packet Oral BID WC   pantoprazole  40 mg Oral Daily   polycarbophil  625 mg Oral BID   Ensure Max Protein  11 oz Oral BID   sodium chloride flush  10-40 mL Intracatheter Q12H   thiamine  100 mg Oral Daily   zinc sulfate  220 mg Oral Daily   Continuous Infusions:  sodium chloride 10 mL/hr at 03/03/22 1712   cefTRIAXone (ROCEPHIN)  IV 2 g (03/03/22 1842)   metroNIDAZOLE 500 mg (03/04/22 0520)   PRN Meds:.sodium chloride, acetaminophen, alum & mag hydroxide-simeth, bisacodyl, diphenhydrAMINE, guaiFENesin-dextromethorphan, hydrocortisone cream, hydrOXYzine, magic mouthwash, menthol-cetylpyridinium, methylphenidate, naphazoline-glycerin, ondansetron (ZOFRAN) IV, mouth rinse, phenol, polyethylene glycol, prochlorperazine **OR** prochlorperazine **OR** prochlorperazine, simethicone, sodium chloride, sodium chloride flush, traZODone  ASSESSMENT AND PLAN:   1) Crohn's Disease: Colonoscopy in November with deeply cratered ulcers throughout the colon, with decreased inflammation in the rectum.  Subsequently admitted with sigmoid perforation requiring emergency ex lap with descending/sigmoid resection and transverse colostomy.  Has had a complex hospital course, to include acute rectal bleeding on 02/25/2022 from deep cratered rectal ulceration, treated with PuroStat hemostatic spray with resolution.  He has since been started on Solu-Medrol.  Had initially planned for bowel prep this  evening with colonoscopy via ostomy tomorrow.  However, in light of acute rebleeding event today, plan for emergent flexible sigmoidoscopy for therapeutic intent.  He did have lunch 2 hours prior, so discussed potentially doing this as an unsedated procedure as was done 1 week ago and well-tolerated by the patient.  Unfortunately, this would mean 2 separate endoscopic procedures, as we would still want to do the colonoscopy at some point in the near future to evaluate for mucosal response to therapy in the right/mid colon. - Stat CBC now - Plan for emergent flexible sigmoidoscopy for therapeutic intent today - Continue IV Solu-Medrol (day 3) - Continue Flagyl (day 4) - Eventual plan for initiating Remicade - Intermediate metabolizer on TPMT, so no plan to initiate immunomodulators  2) LLQ abscess S/p IR drainage on 02/26/2022 with cultures positive for E. coli and Bacteroides.  Started on Rocephin - Repeat CT to evaluate for improvement, particular with purulent drainage noted yesterday -Continue ABX  3) Ascites Persistent peritoneal fluid 2/2 recent surgery and likely component of significant colonic inflammation.  Paracentesis on 02/26/22 negative for SBP  4) Hypoxia New O2 requirement.  CXR on 12/17 with vascular congestion and large bilateral pleural effusions.  Cardiology service consulted and diuresed with IV Lasix on 12/17, and another 40 mg IV this morning - Cardiology planning on  TTE - Volume management per Cardiology service   Gerrit Heck, DO, [facility]

## 2022-03-04 NOTE — Progress Notes (Signed)
Physical Therapy Session Note  Patient Details  Name: Richard [name] MRN: [id number] Date of Birth: December 19, 1955  Today's Date: 03/04/2022 PT Individual Time: 1046-1202 PT Individual Time Calculation (min): 76 min   Short Term Goals: Week 1:  PT Short Term Goal 1 (Week 1): pt will perform supine to/from sit w/mod assist of 1 PT Short Term Goal 2 (Week 1): pt will perform sit to stand from elevated surface w/mod assist of 2 PT Short Term Goal 3 (Week 1): pt will propel wc 26f w/mod assist PT Short Term Goal 4 (Week 1): Pt will transfer bed to/from wc w/LRAD and mod assist of 2  Skilled Therapeutic Interventions/Progress Updates:      Therapy Documentation Precautions:  Precautions Precautions: Other (comment) (pt  is extremly anxious) Precaution Comments: L colostomy, monitor o2/HR Other Brace: binder was not in place Restrictions Weight Bearing Restrictions: No  Pt received seated in TIS w/c requesting to return to bed due to fatigue. Pt without verbal reports of pain in session. Pt required +2 assist with stedy transfer from w/c and required 3 attempts before successful. Pt transferred via Stedy to bed and required + 2 for sit to lying. Pt agreeable to participate in bed level exercises to strengthen LE's. Pt performed 1 x 8 L LE quad sets and progressed to bilateral 3 x 8 straight leg raises. Cardiology MD arrived mid session and rounded with patient and wife. Following discussion PT oriented pt and spouse to CIR policies and procedures. PT informed pt and family regarding team conference meeting scheduled for tomorrow to determine discharge date as pt's wife inquired when the date would be set. PT provided pt's spouse with home measurement worksheet along with ramp building packet. Pt left semi-reclined in bed with all needs in reach and alarm on.     Therapy/Group: Individual Therapy  SVerl DickerSVerl DickerPT, DPT  03/04/2022, 7:45 AM

## 2022-03-04 NOTE — Progress Notes (Signed)
Have attempted to see patient several times today. Has had therapy, WOC and MD in room each time. Will try again later.

## 2022-03-04 NOTE — Telephone Encounter (Signed)
Requesting:Methylphenidate Contract:01/26/21 UDS:01/26/21 Last Visit:01/23/22 Next Visit: 04/25/22 Last Refill:01/16/22(30,0)   Please Advise

## 2022-03-04 NOTE — IPOC Note (Signed)
Overall Plan of Care [facility]) Patient Details Name: Richard [name] MRN: [id number] DOB: 1955/08/22  Admitting Diagnosis: [facility] Problems: Principal Problem:   Debility Active Problems:   ABLA (acute blood loss anemia)   Crohn's disease of large intestine with other complication (HCC)     Functional Problem List: Nursing Bladder, Bowel, Safety, Sensory, Edema, Skin Integrity, Endurance, Medication Management, Motor, Nutrition, Pain  PT Balance, Skin Integrity, Behavior, Edema, Endurance, Nutrition, Pain, Sensory  OT Edema, Endurance, Balance, Motor, Behavior, Safety  SLP    TR         Basic ADL's: OT Eating, Grooming, Bathing, Dressing     Advanced  ADL's: OT Simple Meal Preparation     Transfers: PT Bed Mobility, Bed to Chair, Car  OT Tub/Shower, Toilet     Locomotion: PT Ambulation, Wheelchair Mobility     Additional Impairments: OT Fuctional Use of Upper Extremity (3-/5MMT BUE)  SLP        TR      Anticipated Outcomes Item Anticipated Outcome  Self Feeding s/u  Swallowing      Basic self-care  ModI  Toileting      Bathroom Transfers ModI  Bowel/Bladder  incontinent of urine, ostomy  Transfers  min  Locomotion  min  Communication     Cognition     Pain  less than 3  Safety/Judgment  remain fall free whille in rehab   Therapy Plan: PT Intensity: Minimum of 1-2 x/day ,45 to 90 minutes PT Frequency: 5 out of 7 days PT Duration Estimated Length of Stay: 3 weeks OT Frequency: 5 out of 7 days OT Duration/Estimated Length of Stay: 3 weeks     Team Interventions: Nursing Interventions Patient/Family Education, Pain Management, Bladder Management, Medication Management, Discharge Planning, Bowel Management, Skin Care/Wound Management, Psychosocial Support, Disease Management/Prevention  PT interventions Ambulation/gait training, Discharge planning, Functional mobility training, Psychosocial support, Therapeutic Activities,  Balance/vestibular training, Disease management/prevention, Neuromuscular re-education, Skin care/wound management, Therapeutic Exercise, Wheelchair propulsion/positioning, DME/adaptive equipment instruction, Pain management, UE/LE Strength taining/ROM, Patient/family education, Stair training, UE/LE Coordination activities  OT Interventions    SLP Interventions    TR Interventions    SW/CM Interventions Discharge Planning, Psychosocial Support, Patient/Family Education   Barriers to Discharge MD  Medical stability  Nursing Decreased caregiver support, Home environment access/layout, Incontinence, Wound Care, Lack of/limited family support, Weight 2 level home with B/B main level 5 entry ste with rails on right and left. Flight of ste in home with rail on left  PT Inaccessible home environment, Home environment access/layout, Wound Care, Weight, Pending surgery, Behavior, Other (comments) pt w/limited motivation, low activity tolerance  OT      SLP      SW Insurance for SNF coverage     Team Discharge Planning: Destination: PT-Home ,OT- Home , SLP-  Projected Follow-up: PT-Home health PT, OT-  Home health OT, SLP-  Projected Equipment Needs: PT-To be determined, OT-  , SLP-  Equipment Details: PT- , OT-Further assessment to be determined Patient/family involved in discharge planning: PT- Patient,  OT-Patient, SLP-   MD ELOS: 7-10d Medical Rehab Prognosis:  Fair Assessment: The patient has been admitted for CIR therapies with the diagnosis of debility due to GIB. The team will be addressing functional mobility, strength, stamina, balance, safety, adaptive techniques and equipment, self-care, bowel and bladder mgt, patient and caregiver education, monitor Hgb. Goals have been set at MinA-Mod I. Anticipated discharge destination is Home.        See Team  Conference Notes for weekly updates to the plan of care

## 2022-03-04 NOTE — Progress Notes (Signed)
[facility] Individual Statement of Services  Patient Name:  Richard [name]  Date:  03/04/2022  Welcome to the [facility].  Our goal is to provide you with an individualized program based on your diagnosis and situation, designed to meet your specific needs.  With this comprehensive rehabilitation program, you will be expected to participate in at least 3 hours of rehabilitation therapies Monday-Friday, with modified therapy programming on the weekends.  Your rehabilitation program will include the following services:  Physical Therapy (PT), Occupational Therapy (OT), 24 hour per day rehabilitation nursing, Therapeutic Recreaction (TR), Neuropsychology, Care Coordinator, Rehabilitation Medicine, Nutrition Services, and Pharmacy Services  Weekly team conferences will be held on Tuesday to discuss your progress.  Your Inpatient Rehabilitation Care Coordinator will talk with you frequently to get your input and to update you on team discussions.  Team conferences with you and your family in attendance may also be held.  Expected length of stay: 2.5-3 weeks  Overall anticipated outcome: CGA-min level  Depending on your progress and recovery, your program may change. Your Inpatient Rehabilitation Care Coordinator will coordinate services and will keep you informed of any changes. Your Inpatient Rehabilitation Care Coordinator's name and contact numbers are listed  below.  The following services may also be recommended but are not provided by the [facility] will be made to provide these services after discharge if needed.  Arrangements include referral to agencies that provide these services.  Your insurance has been verified to be:  [place] Your primary doctor is:  Shawnie Dapper  Pertinent information  will be shared with your doctor and your insurance company.  Inpatient Rehabilitation Care Coordinator:  Ovidio Kin, [place] or Emilia Beck  Information discussed with and copy given to patient by: Elease Hashimoto, 03/04/2022, 10:05 AM

## 2022-03-05 ENCOUNTER — Observation Stay (HOSPITAL_COMMUNITY): Payer: 59

## 2022-03-05 ENCOUNTER — Encounter: Payer: Self-pay | Admitting: Family Medicine

## 2022-03-05 ENCOUNTER — Inpatient Hospital Stay (HOSPITAL_COMMUNITY)
Admission: RE | Admit: 2022-03-05 | Discharge: 2022-03-08 | DRG: 356 | Disposition: A | Discharge status: Rehabilitation Facility | Payer: 59 | Source: Other Acute Inpatient Hospital | Attending: Internal Medicine | Admitting: Internal Medicine

## 2022-03-05 DIAGNOSIS — Z794 Long term (current) use of insulin: Secondary | ICD-10-CM | POA: Diagnosis not present

## 2022-03-05 DIAGNOSIS — J90 Pleural effusion, not elsewhere classified: Secondary | ICD-10-CM | POA: Diagnosis present

## 2022-03-05 DIAGNOSIS — Z9049 Acquired absence of other specified parts of digestive tract: Secondary | ICD-10-CM

## 2022-03-05 DIAGNOSIS — F909 Attention-deficit hyperactivity disorder, unspecified type: Secondary | ICD-10-CM | POA: Diagnosis present

## 2022-03-05 DIAGNOSIS — Z7952 Long term (current) use of systemic steroids: Secondary | ICD-10-CM

## 2022-03-05 DIAGNOSIS — R5381 Other malaise: Secondary | ICD-10-CM | POA: Diagnosis present

## 2022-03-05 DIAGNOSIS — I509 Heart failure, unspecified: Secondary | ICD-10-CM

## 2022-03-05 DIAGNOSIS — Z7984 Long term (current) use of oral hypoglycemic drugs: Secondary | ICD-10-CM | POA: Diagnosis not present

## 2022-03-05 DIAGNOSIS — K50911 Crohn's disease, unspecified, with rectal bleeding: Secondary | ICD-10-CM | POA: Diagnosis present

## 2022-03-05 DIAGNOSIS — K922 Gastrointestinal hemorrhage, unspecified: Principal | ICD-10-CM | POA: Diagnosis present

## 2022-03-05 DIAGNOSIS — Z8616 Personal history of COVID-19: Secondary | ICD-10-CM

## 2022-03-05 DIAGNOSIS — Z87891 Personal history of nicotine dependence: Secondary | ICD-10-CM | POA: Diagnosis not present

## 2022-03-05 DIAGNOSIS — Z79899 Other long term (current) drug therapy: Secondary | ICD-10-CM | POA: Diagnosis not present

## 2022-03-05 DIAGNOSIS — Z8719 Personal history of other diseases of the digestive system: Secondary | ICD-10-CM

## 2022-03-05 DIAGNOSIS — G4733 Obstructive sleep apnea (adult) (pediatric): Secondary | ICD-10-CM | POA: Diagnosis present

## 2022-03-05 DIAGNOSIS — E785 Hyperlipidemia, unspecified: Secondary | ICD-10-CM | POA: Diagnosis present

## 2022-03-05 DIAGNOSIS — D539 Nutritional anemia, unspecified: Secondary | ICD-10-CM | POA: Diagnosis present

## 2022-03-05 DIAGNOSIS — E119 Type 2 diabetes mellitus without complications: Secondary | ICD-10-CM

## 2022-03-05 DIAGNOSIS — F419 Anxiety disorder, unspecified: Secondary | ICD-10-CM | POA: Diagnosis present

## 2022-03-05 DIAGNOSIS — K626 Ulcer of anus and rectum: Secondary | ICD-10-CM | POA: Diagnosis present

## 2022-03-05 DIAGNOSIS — F329 Major depressive disorder, single episode, unspecified: Secondary | ICD-10-CM | POA: Diagnosis not present

## 2022-03-05 DIAGNOSIS — T380X5A Adverse effect of glucocorticoids and synthetic analogues, initial encounter: Secondary | ICD-10-CM | POA: Diagnosis not present

## 2022-03-05 DIAGNOSIS — T373X5A Adverse effect of other antiprotozoal drugs, initial encounter: Secondary | ICD-10-CM | POA: Diagnosis not present

## 2022-03-05 DIAGNOSIS — I10 Essential (primary) hypertension: Secondary | ICD-10-CM | POA: Diagnosis present

## 2022-03-05 DIAGNOSIS — R0689 Other abnormalities of breathing: Secondary | ICD-10-CM | POA: Diagnosis present

## 2022-03-05 DIAGNOSIS — K50111 Crohn's disease of large intestine with rectal bleeding: Secondary | ICD-10-CM | POA: Diagnosis present

## 2022-03-05 DIAGNOSIS — Z801 Family history of malignant neoplasm of trachea, bronchus and lung: Secondary | ICD-10-CM

## 2022-03-05 DIAGNOSIS — R000 Tachycardia, unspecified: Secondary | ICD-10-CM | POA: Diagnosis not present

## 2022-03-05 DIAGNOSIS — R188 Other ascites: Secondary | ICD-10-CM | POA: Diagnosis present

## 2022-03-05 DIAGNOSIS — L270 Generalized skin eruption due to drugs and medicaments taken internally: Secondary | ICD-10-CM | POA: Diagnosis not present

## 2022-03-05 DIAGNOSIS — L89322 Pressure ulcer of left buttock, stage 2: Secondary | ICD-10-CM | POA: Diagnosis present

## 2022-03-05 DIAGNOSIS — K50118 Crohn's disease of large intestine with other complication: Secondary | ICD-10-CM | POA: Diagnosis present

## 2022-03-05 DIAGNOSIS — K50114 Crohn's disease of large intestine with abscess: Secondary | ICD-10-CM | POA: Diagnosis present

## 2022-03-05 DIAGNOSIS — K651 Peritoneal abscess: Secondary | ICD-10-CM | POA: Diagnosis present

## 2022-03-05 DIAGNOSIS — B9620 Unspecified Escherichia coli [E. coli] as the cause of diseases classified elsewhere: Secondary | ICD-10-CM | POA: Diagnosis present

## 2022-03-05 DIAGNOSIS — R748 Abnormal levels of other serum enzymes: Secondary | ICD-10-CM | POA: Diagnosis present

## 2022-03-05 DIAGNOSIS — F40240 Claustrophobia: Secondary | ICD-10-CM | POA: Diagnosis present

## 2022-03-05 DIAGNOSIS — Z933 Colostomy status: Secondary | ICD-10-CM

## 2022-03-05 DIAGNOSIS — D500 Iron deficiency anemia secondary to blood loss (chronic): Secondary | ICD-10-CM | POA: Diagnosis present

## 2022-03-05 DIAGNOSIS — E1165 Type 2 diabetes mellitus with hyperglycemia: Secondary | ICD-10-CM | POA: Diagnosis not present

## 2022-03-05 DIAGNOSIS — K625 Hemorrhage of anus and rectum: Secondary | ICD-10-CM | POA: Diagnosis present

## 2022-03-05 DIAGNOSIS — E8770 Fluid overload, unspecified: Secondary | ICD-10-CM | POA: Diagnosis present

## 2022-03-05 DIAGNOSIS — J9621 Acute and chronic respiratory failure with hypoxia: Secondary | ICD-10-CM | POA: Diagnosis not present

## 2022-03-05 DIAGNOSIS — E440 Moderate protein-calorie malnutrition: Secondary | ICD-10-CM | POA: Diagnosis present

## 2022-03-05 DIAGNOSIS — E669 Obesity, unspecified: Secondary | ICD-10-CM | POA: Diagnosis present

## 2022-03-05 LAB — ECHOCARDIOGRAM COMPLETE
AR max vel: 3.01 cm2
AV Area VTI: 2.98 cm2
AV Area mean vel: 3.03 cm2
AV Mean grad: 4 mmHg
AV Peak grad: 8.4 mmHg
Ao pk vel: 1.45 m/s
S' Lateral: 2.6 cm

## 2022-03-05 LAB — CBC
HCT: 27.3 % — ABNORMAL LOW (ref 39.0–52.0)
HCT: 30.4 % — ABNORMAL LOW (ref 39.0–52.0)
Hemoglobin: 10 g/dL — ABNORMAL LOW (ref 13.0–17.0)
Hemoglobin: 8.9 g/dL — ABNORMAL LOW (ref 13.0–17.0)
MCH: 31.8 pg (ref 26.0–34.0)
MCH: 32.1 pg (ref 26.0–34.0)
MCHC: 32.6 g/dL (ref 30.0–36.0)
MCHC: 32.9 g/dL (ref 30.0–36.0)
MCV: 97.4 fL (ref 80.0–100.0)
MCV: 97.5 fL (ref 80.0–100.0)
Platelets: 328 10*3/uL (ref 150–400)
Platelets: 374 10*3/uL (ref 150–400)
RBC: 2.8 MIL/uL — ABNORMAL LOW (ref 4.22–5.81)
RBC: 3.12 MIL/uL — ABNORMAL LOW (ref 4.22–5.81)
RDW: 15.9 % — ABNORMAL HIGH (ref 11.5–15.5)
RDW: 16.5 % — ABNORMAL HIGH (ref 11.5–15.5)
WBC: 10.7 10*3/uL — ABNORMAL HIGH (ref 4.0–10.5)
WBC: 17.6 10*3/uL — ABNORMAL HIGH (ref 4.0–10.5)
nRBC: 0 % (ref 0.0–0.2)
nRBC: 0 % (ref 0.0–0.2)

## 2022-03-05 LAB — GLUCOSE, CAPILLARY
Glucose-Capillary: 186 mg/dL — ABNORMAL HIGH (ref 70–99)
Glucose-Capillary: 165 mg/dL — ABNORMAL HIGH (ref 70–99)
Glucose-Capillary: 172 mg/dL — ABNORMAL HIGH (ref 70–99)
Glucose-Capillary: 388 mg/dL — ABNORMAL HIGH (ref 70–99)
Glucose-Capillary: 320 mg/dL — ABNORMAL HIGH (ref 70–99)

## 2022-03-05 LAB — TYPE AND SCREEN
ABO/RH(D): O NEG
Antibody Screen: NEGATIVE

## 2022-03-05 LAB — HIV ANTIBODY (ROUTINE TESTING W REFLEX): HIV Screen 4th Generation wRfx: NONREACTIVE

## 2022-03-05 SURGERY — COLONOSCOPY
Anesthesia: Monitor Anesthesia Care

## 2022-03-05 MED ORDER — PROCHLORPERAZINE EDISYLATE 10 MG/2ML IJ SOLN: 5.0000 mg | Freq: Four times a day (QID) | INTRAMUSCULAR | Status: DC | PRN

## 2022-03-05 MED ORDER — INSULIN ASPART 100 UNIT/ML IJ SOLN
3.0000 [IU] | Freq: Three times a day (TID) | INTRAMUSCULAR | Status: DC
  Administered 2022-03-05 – 2022-03-06 (×3): 3 [IU] via SUBCUTANEOUS

## 2022-03-05 MED ORDER — NONFORMULARY OR COMPOUNDED ITEM
1.0000 | Freq: Every day | Status: DC
  Filled 2022-03-05: qty 1

## 2022-03-05 MED ORDER — ONDANSETRON HCL 4 MG/2ML IJ SOLN: 4.0000 mg | Freq: Four times a day (QID) | INTRAMUSCULAR | Status: DC | PRN

## 2022-03-05 MED ORDER — GUAIFENESIN-DM 100-10 MG/5ML PO SYRP: 5.0000 mL | ORAL_SOLUTION | Freq: Four times a day (QID) | ORAL | Status: DC | PRN

## 2022-03-05 MED ORDER — SALINE SPRAY 0.65 % NA SOLN: 1.0000 | NASAL | Status: DC | PRN

## 2022-03-05 MED ORDER — SODIUM CHLORIDE 0.9 % IV SOLN
5.0000 mg/kg | Freq: Once | INTRAVENOUS | Status: AC
  Administered 2022-03-06: 600 mg via INTRAVENOUS
  Filled 2022-03-05: qty 60

## 2022-03-05 MED ORDER — ALUM & MAG HYDROXIDE-SIMETH 200-200-20 MG/5ML PO SUSP: 30.0000 mL | Freq: Four times a day (QID) | ORAL | Status: DC | PRN

## 2022-03-05 MED ORDER — ONDANSETRON HCL 4 MG PO TABS: 4.0000 mg | ORAL_TABLET | Freq: Four times a day (QID) | ORAL | Status: DC | PRN

## 2022-03-05 MED ORDER — ZINC SULFATE 220 (50 ZN) MG PO CAPS
220.0000 mg | ORAL_CAPSULE | Freq: Every day | ORAL | Status: DC
  Administered 2022-03-05 – 2022-03-08 (×4): 220 mg via ORAL
  Filled 2022-03-05 (×4): qty 1

## 2022-03-05 MED ORDER — PROCHLORPERAZINE MALEATE 5 MG PO TABS: 5.0000 mg | ORAL_TABLET | Freq: Four times a day (QID) | ORAL | Status: DC | PRN

## 2022-03-05 MED ORDER — ACETAMINOPHEN 650 MG RE SUPP: 650.0000 mg | Freq: Four times a day (QID) | RECTAL | Status: DC | PRN

## 2022-03-05 MED ORDER — SODIUM CHLORIDE 0.9 % IV SOLN
2.0000 g | INTRAVENOUS | Status: DC
  Administered 2022-03-05 – 2022-03-06 (×2): 2 g via INTRAVENOUS
  Filled 2022-03-05 (×2): qty 20

## 2022-03-05 MED ORDER — INSULIN ASPART 100 UNIT/ML IJ SOLN
0.0000 [IU] | INTRAMUSCULAR | Status: DC
  Administered 2022-03-05: 2 [IU] via SUBCUTANEOUS

## 2022-03-05 MED ORDER — HYDROXYZINE HCL 25 MG PO TABS: 25.0000 mg | ORAL_TABLET | Freq: Three times a day (TID) | ORAL | Status: DC | PRN

## 2022-03-05 MED ORDER — MENTHOL 3 MG MT LOZG: 1.0000 | LOZENGE | OROMUCOSAL | Status: DC | PRN

## 2022-03-05 MED ORDER — INSULIN ASPART 100 UNIT/ML IJ SOLN
0.0000 [IU] | Freq: Every day | INTRAMUSCULAR | Status: DC
  Administered 2022-03-05 – 2022-03-06 (×2): 4 [IU] via SUBCUTANEOUS
  Administered 2022-03-07: 3 [IU] via SUBCUTANEOUS

## 2022-03-05 MED ORDER — MAGIC MOUTHWASH: 15.0000 mL | Freq: Four times a day (QID) | ORAL | Status: DC | PRN

## 2022-03-05 MED ORDER — METHYLPREDNISOLONE SODIUM SUCC 40 MG IJ SOLR
40.0000 mg | Freq: Every day | INTRAMUSCULAR | Status: DC
  Administered 2022-03-05 – 2022-03-07 (×3): 40 mg via INTRAVENOUS
  Filled 2022-03-05 (×3): qty 1

## 2022-03-05 MED ORDER — ATORVASTATIN CALCIUM 40 MG PO TABS: 40.0000 mg | ORAL_TABLET | Freq: Every day | ORAL | Status: DC

## 2022-03-05 MED ORDER — PROCHLORPERAZINE 25 MG RE SUPP: 12.5000 mg | Freq: Four times a day (QID) | RECTAL | Status: DC | PRN

## 2022-03-05 MED ORDER — SIMETHICONE 40 MG/0.6ML PO SUSP: 80.0000 mg | Freq: Four times a day (QID) | ORAL | Status: DC | PRN

## 2022-03-05 MED ORDER — PHENOL 1.4 % MT LIQD: 2.0000 | OROMUCOSAL | Status: DC | PRN

## 2022-03-05 MED ORDER — SODIUM CHLORIDE 0.9% FLUSH
10.0000 mL | Freq: Two times a day (BID) | INTRAVENOUS | Status: DC
  Administered 2022-03-05 – 2022-03-08 (×6): 10 mL

## 2022-03-05 MED ORDER — GUAIFENESIN ER 600 MG PO TB12
600.0000 mg | ORAL_TABLET | Freq: Two times a day (BID) | ORAL | Status: DC
  Administered 2022-03-05 – 2022-03-06 (×3): 600 mg via ORAL
  Filled 2022-03-05 (×3): qty 1

## 2022-03-05 MED ORDER — CEFTRIAXONE SODIUM 1 G IJ SOLR: 2.0000 g | INTRAMUSCULAR | Status: DC

## 2022-03-05 MED ORDER — ORAL CARE MOUTH RINSE: 15.0000 mL | OROMUCOSAL | Status: DC | PRN

## 2022-03-05 MED ORDER — CHLORHEXIDINE GLUCONATE CLOTH 2 % EX PADS: 6.0000 | MEDICATED_PAD | Freq: Two times a day (BID) | CUTANEOUS | Status: DC

## 2022-03-05 MED ORDER — METRONIDAZOLE 500 MG/100ML IV SOLN
500.0000 mg | Freq: Two times a day (BID) | INTRAVENOUS | Status: DC
  Administered 2022-03-05 – 2022-03-07 (×4): 500 mg via INTRAVENOUS
  Filled 2022-03-05 (×4): qty 100

## 2022-03-05 MED ORDER — SODIUM CHLORIDE 0.9% FLUSH
3.0000 mL | Freq: Two times a day (BID) | INTRAVENOUS | Status: DC
  Administered 2022-03-05 – 2022-03-07 (×6): 3 mL via INTRAVENOUS

## 2022-03-05 MED ORDER — PANTOPRAZOLE SODIUM 40 MG PO TBEC
40.0000 mg | DELAYED_RELEASE_TABLET | Freq: Every day | ORAL | Status: DC
  Administered 2022-03-05 – 2022-03-08 (×4): 40 mg via ORAL
  Filled 2022-03-05 (×4): qty 1

## 2022-03-05 MED ORDER — INSULIN ASPART 100 UNIT/ML IJ SOLN
0.0000 [IU] | Freq: Three times a day (TID) | INTRAMUSCULAR | Status: DC
  Administered 2022-03-05: 11 [IU] via SUBCUTANEOUS
  Administered 2022-03-05: 3 [IU] via SUBCUTANEOUS
  Administered 2022-03-06: 8 [IU] via SUBCUTANEOUS
  Administered 2022-03-06: 11 [IU] via SUBCUTANEOUS
  Administered 2022-03-07: 8 [IU] via SUBCUTANEOUS
  Administered 2022-03-07: 5 [IU] via SUBCUTANEOUS
  Administered 2022-03-07 – 2022-03-08 (×3): 8 [IU] via SUBCUTANEOUS

## 2022-03-05 MED ORDER — SODIUM CHLORIDE 0.9% FLUSH
10.0000 mL | INTRAVENOUS | Status: DC | PRN
  Administered 2022-03-06 (×2): 10 mL

## 2022-03-05 MED ORDER — CHLORHEXIDINE GLUCONATE CLOTH 2 % EX PADS
6.0000 | MEDICATED_PAD | Freq: Every day | CUTANEOUS | Status: DC
  Administered 2022-03-05 – 2022-03-07 (×3): 6 via TOPICAL

## 2022-03-05 MED ORDER — ATORVASTATIN CALCIUM 40 MG PO TABS
40.0000 mg | ORAL_TABLET | Freq: Every day | ORAL | Status: DC
  Administered 2022-03-05 – 2022-03-08 (×4): 40 mg via ORAL
  Filled 2022-03-05 (×4): qty 1

## 2022-03-05 MED ORDER — LIP MEDEX EX OINT
1.0000 | TOPICAL_OINTMENT | Freq: Two times a day (BID) | CUTANEOUS | Status: DC
  Administered 2022-03-05 – 2022-03-08 (×6): 1 via TOPICAL
  Filled 2022-03-05: qty 7

## 2022-03-05 MED ORDER — NAPHAZOLINE-GLYCERIN 0.012-0.25 % OP SOLN: 1.0000 [drp] | Freq: Four times a day (QID) | OPHTHALMIC | Status: DC | PRN

## 2022-03-05 MED ORDER — THIAMINE MONONITRATE 100 MG PO TABS
100.0000 mg | ORAL_TABLET | Freq: Every day | ORAL | Status: DC
  Administered 2022-03-05 – 2022-03-08 (×4): 100 mg via ORAL
  Filled 2022-03-05 (×4): qty 1

## 2022-03-05 MED ORDER — HYDROCORTISONE 0.5 % EX CREA: TOPICAL_CREAM | Freq: Two times a day (BID) | CUTANEOUS | Status: DC | PRN

## 2022-03-05 MED ORDER — ACETAMINOPHEN 325 MG PO TABS
650.0000 mg | ORAL_TABLET | Freq: Four times a day (QID) | ORAL | Status: DC | PRN
  Administered 2022-03-06: 650 mg via ORAL
  Filled 2022-03-05: qty 2

## 2022-03-05 MED ORDER — TRAZODONE HCL 50 MG PO TABS: 25.0000 mg | ORAL_TABLET | Freq: Every evening | ORAL | Status: DC | PRN

## 2022-03-05 MED ORDER — DIPHENHYDRAMINE HCL 12.5 MG/5ML PO ELIX
12.5000 mg | ORAL_SOLUTION | Freq: Four times a day (QID) | ORAL | Status: DC | PRN
  Administered 2022-03-07: 25 mg via ORAL
  Filled 2022-03-05: qty 10

## 2022-03-05 NOTE — Progress Notes (Signed)
Inpatient Rehabilitation Care Coordinator Discharge Note   Patient Details  Name: Richard [name] MRN: [id number] Date of Birth: Aug 03, 1955   Discharge location: TRANSFERRED TO ACUTE FOR MONITORING  Length of Stay: 4 DAYS  Discharge activity level: MOD ASSIST  Home/community participation: ACTIVE  Patient response WU:[id number] Literacy - How often do you need to have someone help you when you read instructions, pamphlets, or other written material from your doctor or pharmacy?: Never  Patient response YN:WGNFAO Isolation - How often do you feel lonely or isolated from those around you?: Never  Services provided included: MD, RD, PT, OT, RN, CM, Pharmacy, Neuropsych, SW  Financial Services:  Charity fundraiser Utilized: Chartered certified accountant  Choices offered to/list presented to:    Follow-up services arranged:  Other (Comment) (TRANSFERED TO ACUTE)           Patient response to transportation need: Is the patient able to respond to transportation needs?: Yes In the past 12 months, has lack of transportation kept you from medical appointments or from getting medications?: No In the past 12 months, has lack of transportation kept you from meetings, work, or from getting things needed for daily living?: No    Comments (or additional information):TRANSFERRED TO ACUTE WILL NEED TO COME BACK TO REHAB WAS JUST HERE 4 DAYS  Patient/Family verbalized understanding of follow-up arrangements:  Yes  Individual responsible for coordination of the follow-up plan: JILL-WIFE [phone]  Confirmed correct DME delivered: Elease Hashimoto 03/05/2022    Elease Hashimoto

## 2022-03-05 NOTE — Progress Notes (Signed)
Patient admitted after midnight by Dr. Myna Hidalgo, please see H&P.  Tx from CIR with GI bleeding.  Appears to be from Crohn's.  Plan for trial of short chain fatty acid enemas when available.  Continue steroids.  Remicade in next 24-48 hours.  Appreciate GI and GS.  Eulogio Bear DO

## 2022-03-05 NOTE — H&P (Signed)
History and Physical      Richard [name] VZD:[id number] DOB: May 07, 1955 DOA: 03/01/2022   PCP: [name], MD    Patient coming from: [facility]    Chief Complaint: Rectal bleeding    HPI: Richard [name] is a pleasant 66 y.o. male with medical history significant for type 2 diabetes mellitus, hypertension, hyperlipidemia, OSA, Crohn's disease, and perforated sigmoid colon s/p resection and transverse colostomy on 29/51/8841 complicated by intra-abdominal abscess who now has been passing large volume hematochezia.   Patient had been admitted to the hospital from 02/07/2022 until 03/01/2022 when he was discharged to inpatient rehab unit.  He had acute lower GI bleeding on 02/25/2022, requiring 4 units of packed RBC.  He underwent urgent sigmoidoscopy on 02/26/2022 which revealed numerous rectal ulcers that were treated with hemostatic gel.   Yesterday, he developed sudden onset of bright red blood per rectum in the afternoon but no blood from his ostomy.  He was seen by GI for this and underwent an emergent flexible sigmoidoscopy with hemostatic gel applied to multiple distal rectal ulcers.   Overnight, patient again had what was described as a large volume of bright red blood as well as clots.  He denies any abdominal pain, nausea, vomiting, lightheadedness, or chest pain.   On-call PM&R was notified of these events, paged [facility], and consulted hospitalist for admission back to inpatient unit.   Review of Systems:  All other systems reviewed and apart from HPI, are negative.       Past Medical History:  Diagnosis Date   Adult ADHD     Allergic rhinitis     Anxiety      claustrophobic   Bronchopneumonia 11/03/2013   Colon polyps 2012 and 2013    All hyperplastic except one adenomatous w/out high grade dysplasia (recall 2023 per Dr. Deatra Ina)   COVID-19 virus infection 09/29/2020   Elevated transaminase level 2017    Viral hep screening neg.  Suspect fatty liver.   Hyperlipidemia      Hypertension     Obesity     OSA on CPAP      Severe.  CPAP 10 cm H2O.  Follows Dr. Ander Slade.   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 [name] to do surg   Pneumonia     Seborrheic dermatitis of scalp      and face: hydrocort 2% cream bid prn to face, and fluocinonide 0.5% sol'n to scalp bid prn per Dr. Allyson Sabal (04/2014)   Secondary male hypogonadism 04/2021   Type 2 diabetes mellitus with microalbuminuria ([place]) DM dx-09/27/2015. Microalb 2020           Past Surgical History:  Procedure Laterality Date   COLONOSCOPY W/ POLYPECTOMY   04/2010; 10/2011    Initial screening TCS showed 6 polyps (one adenomatous).  Pt says he returned for repeat TCS 10/2011 showed one hyperplastic polyp: Recall 10 yrs per Dr. Samuella Cota SIGMOIDOSCOPY N/A 02/25/2022    Procedure: FLEXIBLE SIGMOIDOSCOPY;  Surgeon: Irving Copas., MD;  Location: [facility];  Service: Gastroenterology;  Laterality: N/A;   HEMOSTASIS CONTROL   02/25/2022    Procedure: HEMOSTASIS CONTROL;  Surgeon: Rush Landmark Telford Nab., MD;  Location: [facility];  Service: Gastroenterology;;   INCISION AND DRAINAGE ABSCESS N/A 11/07/2021    Procedure: Incision and drainage of scrotal abscess;  Surgeon: Leighton Ruff, MD;  Location: [facility];  Service: General;  Laterality: N/A;   LAPAROTOMY N/A 02/07/2022    Procedure: EXPLORATORY  LAPAROTOMY, COLON RESECTION, OSTOMY;  Surgeon: Leighton Ruff, MD;  Location: [facility];  Service: General;  Laterality: N/A;   MOUTH SURGERY          Social History:   reports that he quit smoking about 19 years ago. His smoking use included cigarettes. He smoked an average of 1 pack per day. He has never used smokeless tobacco. He reports current alcohol use of about 7.0 standard drinks of alcohol per week. He reports that he does not use drugs.       Allergies  Allergen Reactions   Lobster [Shellfish Allergy] Nausea Only   Poison Ivy Extract Itching           Family History   Problem Relation Age of Onset   Lung cancer Father     Cancer Father          nasal   Allergies Father     Colon cancer Neg Hx     Esophageal cancer Neg Hx     Rectal cancer Neg Hx     Stomach cancer Neg Hx                 Prior to Admission medications   Medication Sig Start Date End Date Taking? Authorizing Provider  Accu-Chek Softclix Lancets lancets SMARTSIG:Topical 01/19/20     [provider]  acetaminophen (TYLENOL) 325 MG tablet Take 2 tablets (650 mg total) by mouth every 4 (four) hours as needed for mild pain, moderate pain, fever or headache. 03/01/22     Shalhoub, Sherryll Burger, MD  acetaminophen (TYLENOL) 500 MG tablet Take 1,000 mg by mouth every 6 (six) hours as needed for mild pain.       [provider]  alum & mag hydroxide-simeth (MAALOX/MYLANTA) 200-200-20 MG/5ML suspension Take 30 mLs by mouth every 6 (six) hours as needed for indigestion or heartburn (or bloating). 03/01/22     Shalhoub, Sherryll Burger, MD  ascorbic acid (VITAMIN C) 500 MG tablet Take 1 tablet (500 mg total) by mouth 2 (two) times daily. 03/01/22     Shalhoub, Sherryll Burger, MD  atorvastatin (LIPITOR) 40 MG tablet TAKE 1 TABLET BY MOUTH EVERY DAY 02/01/22     McGowen, Adrian Blackwater, MD  cefTRIAXone (ROCEPHIN) 1 g injection Inject 2 g into the muscle daily. 03/01/22     Shalhoub, Sherryll Burger, MD  clotrimazole-betamethasone (LOTRISONE) cream Apply 1 Application topically 2 (two) times daily. 01/23/22     McGowen, Adrian Blackwater, MD  diphenhydrAMINE (BENADRYL) 25 mg capsule Take 25 mg by mouth daily as needed for allergies.       [provider]  diphenoxylate-atropine (LOMOTIL) 2.5-0.025 MG tablet Take 1-2 tablets by mouth 4 (four) times daily as needed for diarrhea or loose stools. 1-2 tabs po qid prn diarrhea 01/12/22     Pyrtle, Lajuan Lines, MD  empagliflozin (JARDIANCE) 10 MG TABS tablet TAKE 1 TABLET BY MOUTH EVERY DAY BEFORE BREAKFAST Patient taking differently: Take 10 mg by mouth daily. 02/05/22      McGowen, Adrian Blackwater, MD  feeding supplement (ENSURE ENLIVE / ENSURE PLUS) LIQD Take 237 mLs by mouth 3 (three) times daily between meals. 03/01/22     Shalhoub, Sherryll Burger, MD  ferrous sulfate 325 (65 FE) MG tablet Take 1 tablet (325 mg total) by mouth 2 (two) times daily before a meal. 03/01/22     Shalhoub, Sherryll Burger, MD  glucose blood (ACCU-CHEK GUIDE) test strip USE TO TEST BLOOD SUGAR TWICE DAILY 07/12/21  McGowen, Adrian Blackwater, MD  hydrOXYzine (ATARAX) 25 MG tablet Take 1 tablet (25 mg total) by mouth 3 (three) times daily as needed for itching. 03/01/22     Shalhoub, Sherryll Burger, MD  insulin aspart (NOVOLOG) 100 UNIT/ML injection Inject 0-15 Units into the skin 3 (three) times daily with meals. 03/01/22     Shalhoub, Sherryll Burger, MD  insulin aspart (NOVOLOG) 100 UNIT/ML injection Inject 0-5 Units into the skin at bedtime. 03/01/22     Shalhoub, Sherryll Burger, MD  lisinopril (ZESTRIL) 10 MG tablet TAKE 1 TABLET DAILY 01/10/22     McGowen, Adrian Blackwater, MD  metFORMIN (GLUCOPHAGE) 500 MG tablet TAKE 1 TABLET BY MOUTH TWICE A DAY WITH FOOD 02/01/22     McGowen, Adrian Blackwater, MD  methylphenidate (METADATE CD) 20 MG CR capsule Take 1 capsule (20 mg total) by mouth daily as needed. 03/04/22     McGowen, Adrian Blackwater, MD  methylPREDNISolone sodium succinate (SOLU-MEDROL) 40 mg/mL injection Inject 1 mL (40 mg total) into the vein daily. 03/02/22     Shalhoub, Sherryll Burger, MD  metoCLOPramide (REGLAN) 5 MG tablet Take 1 tablet (5 mg total) by mouth 2 (two) times daily with breakfast and lunch. 03/02/22     Shalhoub, Sherryll Burger, MD  metoprolol tartrate (LOPRESSOR) 25 MG tablet Take 1 tablet (25 mg total) by mouth 2 (two) times daily. 03/01/22 03/01/23   Shalhoub, Sherryll Burger, MD  metroNIDAZOLE (FLAGYL) 500 MG/100ML Inject 100 mLs (500 mg total) into the vein every 12 (twelve) hours. 03/01/22     Shalhoub, Sherryll Burger, MD  Multiple Vitamin (MULTIVITAMIN WITH MINERALS) TABS tablet Take 1 tablet by mouth daily. 03/02/22     Shalhoub, Sherryll Burger, MD   ondansetron (ZOFRAN) 4 MG/2ML SOLN injection Inject 2 mLs (4 mg total) into the vein every 6 (six) hours as needed for nausea or vomiting. 03/01/22     Shalhoub, Sherryll Burger, MD  pantoprazole (PROTONIX) 40 MG tablet Take 1 tablet (40 mg total) by mouth daily. 03/02/22     Shalhoub, Sherryll Burger, MD  polycarbophil (FIBERCON) 625 MG tablet Take 1 tablet (625 mg total) by mouth 2 (two) times daily. 03/01/22     Shalhoub, Sherryll Burger, MD  SYRINGE-NEEDLE, DISP, 3 ML 22G X 1-1/2" 3 ML MISC USE TO ADMINISTER TESTOSTERONE INJECTION EVERY 2 WEEKS 06/06/21     McGowen, Adrian Blackwater, MD  Syringe/Needle, Disp, 18G X 1" 3 ML MISC USE TO ADMINISTER TESTOSTERONE INJECTION EVERY 2 WEEKS 06/06/21     McGowen, Adrian Blackwater, MD  testosterone cypionate (DEPOTESTOSTERONE CYPIONATE) 200 MG/ML injection 200 MG (1 ML) SQ Q 2 WEEKS Patient not taking: Reported on 02/08/2022 12/12/21     [name], MD  thiamine (VITAMIN B-1) 100 MG tablet Take 1 tablet (100 mg total) by mouth daily. 03/02/22     Vernelle Emerald, MD      Physical Exam:       Vitals:    03/04/22 1504 03/04/22 1958 03/04/22 2211 03/05/22 0018  BP: 120/84 119/79 118/73 114/82  Pulse: 85 97 (!) 108 88  Resp: (!) '24 17   20  '$ Temp:   99 F (37.2 C)   (!) 97.1 F (36.2 C)  TempSrc:   Oral   Axillary  SpO2: 92% 95%   99%  Weight:          Height:              Constitutional: NAD, calm  Eyes: PERTLA, lids and  conjunctivae normal ENMT: Mucous membranes are moist. Posterior pharynx clear of any exudate or lesions.   Neck: supple, no masses  Respiratory:  no wheezing, no crackles. No accessory muscle use.  Cardiovascular: S1 & S2 heard, regular rate and rhythm. Bilateral lower extremity edema. Abdomen: soft, non-tender, brown stool in ostomy bag. Bowel sounds active.  Musculoskeletal: no clubbing / cyanosis. No joint deformity upper and lower extremities.   Skin: no significant rashes, lesions, ulcers. Warm, dry, well-perfused. Neurologic: CN 2-12 grossly  intact. Moving all extremities. Alert and oriented.  Psychiatric: Pleasant. Cooperative.      Labs and Imaging on Admission: I have personally reviewed following labs and imaging studies   CBC: Last Labs            Recent Labs  Lab 02/27/22 0225 02/27/22 0812 02/28/22 0410 03/01/22 0447 03/03/22 1000 03/04/22 0425 03/04/22 1433 03/05/22 0012  WBC 5.3  --  5.4 7.3 13.4* 10.4  --  10.7*  NEUTROABS 4.0  --  4.8 5.7 11.4* 8.0*  --   --   HGB 7.8*   < > 8.4* 8.1* 10.4* 9.4* 10.9* 8.9*  HCT 24.6*   < > 26.6* 25.9* 32.7* 29.2* 32.0* 27.3*  MCV 97.2  --  97.8 99.6 97.6 98.0  --  97.5  PLT 203  --  223 262 359 325  --  328   < > = values in this interval not displayed.      Basic Metabolic Panel: Last Labs           Recent Labs  Lab 02/26/22 0521 02/27/22 0225 02/28/22 0410 03/01/22 0447 03/03/22 1842 03/04/22 0425 03/04/22 1433  NA 140 140 140 142 137 136 138  K 3.5 3.5 4.2 4.0 4.2 3.9 4.0  CL 102 101 101 103 101 98 98  CO2 34* 33* '30 30 28 30  '$ --   GLUCOSE 115* 187* 275* 252* 358* 249* 307*  BUN '11 11 11 16 21 21 17  '$ CREATININE <0.30* 0.42* 0.37* 0.35* 0.57* 0.45* 0.40*  CALCIUM 7.8* 8.0* 8.0* 8.0* 8.5* 8.7*  --   MG 1.8 1.6* 1.8 1.8  --   --   --   PHOS 3.3 2.7 2.7 2.3*  --   --   --       GFR: Estimated Creatinine Clearance: 117.7 mL/min (A) (by C-G formula based on SCr of 0.4 mg/dL (L)). Liver Function Tests: Last Labs         Recent Labs  Lab 02/27/22 0225 02/28/22 0410 03/01/22 0447 03/03/22 1842 03/04/22 0425  AST '16 16 15 '$ 47* 28  ALT '10 10 10 29 24  '$ ALKPHOS 80 98 97 153* 146*  BILITOT 0.4 0.6 0.3 0.5 0.6  PROT 5.3* 6.2* 5.8* 5.8* 5.6*  ALBUMIN 2.8* 2.7* 2.3* 2.3* 2.2*      Last Labs  No results for input(s): "LIPASE", "AMYLASE" in the last 168 hours.   Last Labs  No results for input(s): "AMMONIA" in the last 168 hours.   Coagulation Profile: Last Labs     Recent Labs  Lab 02/26/22 0816  INR 1.3*      Cardiac Enzymes: Last Labs   No results for input(s): "CKTOTAL", "CKMB", "CKMBINDEX", "TROPONINI" in the last 168 hours.   BNP (last 3 results) Recent Labs (within last 365 days)  No results for input(s): "PROBNP" in the last 8760 hours.   HbA1C: Recent Labs (last 2 labs)  No results for input(s): "HGBA1C" in the last 72  hours.   CBG: Last Labs         Recent Labs  Lab 03/03/22 2046 03/04/22 0600 03/04/22 1123 03/04/22 1640 03/04/22 2048  GLUCAP 306* 219* 178* 288* 282*      Lipid Profile: Recent Labs (last 2 labs)  No results for input(s): "CHOL", "HDL", "LDLCALC", "TRIG", "CHOLHDL", "LDLDIRECT" in the last 72 hours.   Thyroid Function Tests: Recent Labs (last 2 labs)  No results for input(s): "TSH", "T4TOTAL", "FREET4", "T3FREE", "THYROIDAB" in the last 72 hours.   Anemia Panel: Recent Labs (last 2 labs)  No results for input(s): "VITAMINB12", "FOLATE", "FERRITIN", "TIBC", "IRON", "RETICCTPCT" in the last 72 hours.   Urine analysis: Labs (Brief)          Component Value Date/Time    COLORURINE AMBER (A) 02/09/2022 1213    APPEARANCEUR CLOUDY (A) 02/09/2022 1213    LABSPEC 1.041 (H) 02/09/2022 1213    PHURINE 5.0 02/09/2022 1213    GLUCOSEU >=500 (A) 02/09/2022 1213    GLUCOSEU NEGATIVE 06/13/2011 0953    HGBUR NEGATIVE 02/09/2022 1213    BILIRUBINUR NEGATIVE 02/09/2022 1213    KETONESUR 20 (A) 02/09/2022 1213    PROTEINUR 30 (A) 02/09/2022 1213    UROBILINOGEN 0.2 06/13/2011 0953    NITRITE NEGATIVE 02/09/2022 1213    LEUKOCYTESUR NEGATIVE 02/09/2022 1213      Sepsis Labs: '@LABRCNTIP'$ (procalcitonin:4,lacticidven:4) )        Recent Results (from the past 240 hour(s))  Aerobic/Anaerobic Culture w Gram Stain (surgical/deep wound)     Status: None    Collection Time: 02/26/22 12:24 PM    Specimen: Abscess  Result Value Ref Range Status    Specimen Description     Final      ABSCESS LLQ ABDOMINAL Performed at [facility] [address], [place], [place]  [address]      Special Requests     Final      NONE Performed at [facility], [place] [address], [place], [place] [address]      Gram Stain     Final      ABUNDANT WBC PRESENT, PREDOMINANTLY PMN MODERATE GRAM NEGATIVE RODS      Culture     Final      ABUNDANT ESCHERICHIA COLI FEW BACTEROIDES THETAIOTAOMICRON BETA LACTAMASE NEGATIVE Performed at [facility], [place] [address], [place], [place] [address]      Report Status 03/02/2022 FINAL   Final    Organism ID, Bacteria ESCHERICHIA COLI   Final      Susceptibility    Escherichia coli - MIC*      AMPICILLIN >=32 RESISTANT Resistant        CEFAZOLIN <=4 SENSITIVE Sensitive        CEFEPIME <=0.12 SENSITIVE Sensitive        CEFTAZIDIME <=1 SENSITIVE Sensitive        CEFTRIAXONE <=0.25 SENSITIVE Sensitive        CIPROFLOXACIN >=4 RESISTANT Resistant        GENTAMICIN >=16 RESISTANT Resistant        IMIPENEM <=0.25 SENSITIVE Sensitive        TRIMETH/SULFA <=20 SENSITIVE Sensitive        AMPICILLIN/SULBACTAM 16 INTERMEDIATE Intermediate        PIP/TAZO <=4 SENSITIVE Sensitive       * ABUNDANT ESCHERICHIA COLI  Body fluid culture w Gram Stain     Status: None    Collection Time: 02/26/22 12:24 PM  Specimen: Ascitic; Body Fluid  Result Value Ref Range Status    Specimen Description     Final      ASCITIC Performed at [facility] [address], [place], [place] [address]      Special Requests     Final      NONE Performed at [facility], [place] [address] [place], [place] [address]      Gram Stain     Final      WBC PRESENT, PREDOMINANTLY MONONUCLEAR NO ORGANISMS SEEN CYTOSPIN SMEAR      Culture     Final      NO GROWTH 3 DAYS Performed at [facility], [place] [address] [place], [place] [address]      Report Status 03/02/2022 FINAL   Final      Radiological Exams on Admission:  Imaging Results (Last 48 hours)  CT ABDOMEN PELVIS W CONTRAST   Result Date:  03/04/2022 CLINICAL DATA:  Left lower quadrant abscess status post IR drainage. Crohn's disease. EXAM: CT ABDOMEN AND PELVIS WITH CONTRAST TECHNIQUE: Multidetector CT imaging of the abdomen and pelvis was performed using the standard protocol following bolus administration of intravenous contrast. RADIATION DOSE REDUCTION: This exam was performed according to the departmental dose-optimization program which includes automated exposure control, adjustment of the mA and/or kV according to patient size and/or use of iterative reconstruction technique. CONTRAST:  26m OMNIPAQUE IOHEXOL 350 MG/ML SOLN COMPARISON:  CTA abdomen and pelvis 02/25/2022 FINDINGS: Lower chest: There are stable small bilateral pleural effusions with compressive atelectasis of the bilateral lower lobes. Hepatobiliary: No focal liver abnormality is seen. No gallstones, gallbladder wall thickening, or biliary dilatation. Pancreas: Unremarkable. No pancreatic ductal dilatation or surrounding inflammatory changes. Spleen: Normal in size without focal abnormality. Adrenals/Urinary Tract: Adrenal glands are unremarkable. Kidneys are normal, without renal calculi, focal lesion, or hydronephrosis. Bladder is unremarkable. Stomach/Bowel: Left lower quadrant colostomy again noted. Rectal stump is distended with stool similar to the prior study. There is no evidence for bowel obstruction, pneumatosis or free air. There are some distended small bowel loops without transition point in the left abdomen which may be related to ileus. Stomach is within normal limits. Vascular/Lymphatic: Aortic atherosclerosis. No enlarged abdominal or pelvic lymph nodes. Reproductive: Prostate is unremarkable. Other: There is small volume ascites which has mildly decreased. Mesenteric edema in the left abdomen has decreased. There is a new percutaneous drainage catheter ending in left lower quadrant. Left lower quadrant fluid collection has resolved in the interval. No new  enhancing fluid collections are seen. Presacral edema is stable. Body wall edema is stable. Musculoskeletal: Degenerative changes affect the spine IMPRESSION: 1. Interval placement of percutaneous drainage catheter in the left lower quadrant. Left lower quadrant fluid collection has resolved in the interval. No new fluid collection identified. 2. Small volume ascites has mildly decreased. 3. Stable small bilateral pleural effusions with compressive atelectasis of the bilateral lower lobes. 4. Mildly distended small bowel loops without transition point may be related to ileus. 5. Stable presacral and body wall edema. Aortic Atherosclerosis (ICD10-I70.0). Electronically Signed   By: ARonney AstersM.D.   On: 03/04/2022 22:24    DG Chest 2 View   Result Date: 03/03/2022 CLINICAL DATA:  200808 Hypoxia 200808 [id number] CXR [id number]: PORTABLE CHEST two views COMPARISON:  03/02/2022 FINDINGS: Cardiac silhouette is prominent. There is pulmonary interstitial prominence with vascular congestion. No focal consolidation. No pneumothorax identified. Large bilateral pleural effusions moderate layering  bilateral pleural effusions. Right-sided PICC tip overlies mid SVC. IMPRESSION: Findings suggest CHF. Electronically Signed   By: Sammie Bench M.D.   On: 03/03/2022 12:59         Assessment/Plan    1. Acute lower GI bleeding  - Large volume of hematochezia overnight prompted hospitalist consultation for admission back to inpatient unit  - There is no upper GI s/s and no blood from colostomy  - He underwent flex sig 12/18 with multiple rectal ulcers treated with hemostatic gel - He is hemodynamically stable with Hgb 8.9 (9.4 twenty hrs earlier)  - PM&R PA is consulting with [facility]  - Type and screen, keep npo for now, admit back to inpatient unit, trend H&H, transfuse as needed     2. Intraabdominal abscess  - Percutaneous drain placed by IR on 02/26/22 - Cultures growing E coli and Bacteroides   - Continue Rocephin and Flagyl, drain care     3. Crohn's disease  - Continue Solu-Medrol    4. Type II DM  - A1c was 6.1% in December 2023  - Continue CBG checks and SSI    5. CHF; acute hypoxic respiratory failure   - Cardiology was consulted 03/03/22 for new supplemental O2 requirement with elevated BNP and CHF findings on CXR  - EF unknown, echo is pending  - He is not in any respiratory distress on admission - plan to hold diuretics initially given acute GIB     6. Debility  - He will likely return to inpatient rehab once acute medical issues stabilized      DVT prophylaxis: SCDs  Code Status: Full  Level of Care: Level of care:  Family Communication: none present  Disposition Plan:  Patient is from: Inpatient rehab Anticipated d/c is to: Inpatient rehab  Anticipated d/c date is: 03/06/22  Patient currently: Pending GI consultation, stable H&H  Consults called: GI (Dr. Fuller Plan) consulted by PM&R PA Admission status: Observation     Author: Vianne Bulls, MD 03/05/2022 7:07 AM  For on call review [email or web address].

## 2022-03-05 NOTE — Progress Notes (Signed)
Physical Therapy Note  Patient Details  Name: Richard [name] MRN: [id number] Date of Birth: [birth year] Today's Date: 03/05/2022    Physical Therapy Discharge Note  This patient was unable to complete the inpatient rehab program due to unplanned discharge; therefore did not meet their long term goals. Pt left the program at a +2 assist level for their functional mobility/ transfers. This patient is being discharged from PT services at this time.  Pt's perception of pain in the last five days was unable to answer at this time.    See CareTool for functional status details  If the patient is able to return to inpatient rehabilitation within 3 midnights, this may be considered an interrupted stay and therapy services will resume as ordered. Modification and reinstatement of their goals will be made upon completion of therapy service reevaluations.     Verl Dicker Verl Dicker PT, DPT  03/05/2022, 7:48 AM

## 2022-03-05 NOTE — H&P (Deleted)
History and Physical    BRIGHTEN ORNDOFF FBP:[id number] DOB: 01-08-1956 DOA: 03/01/2022  PCP: [name], MD   Patient coming from: CIR   Chief Complaint: Rectal bleeding   HPI: Richard [name] is a pleasant 66 y.o. male with medical history significant for type 2 diabetes mellitus, hypertension, hyperlipidemia, OSA, Crohn's disease, and perforated sigmoid colon s/p resection and transverse colostomy on 82/42/3536 complicated by intra-abdominal abscess who now has been passing large volume hematochezia.  Patient had been admitted to the hospital from 02/07/2022 until 03/01/2022 when he was discharged to inpatient rehab unit.  He had acute lower GI bleeding on 02/25/2022, requiring 4 units of packed RBC.  He underwent urgent sigmoidoscopy on 02/26/2022 which revealed numerous rectal ulcers that were treated with hemostatic gel.  Yesterday, he developed sudden onset of bright red blood per rectum in the afternoon but no blood from his ostomy.  He was seen by GI for this and underwent an emergent flexible sigmoidoscopy with hemostatic gel applied to multiple distal rectal ulcers.  Overnight, patient again had what was described as a large volume of bright red blood as well as clots.  He denies any abdominal pain, nausea, vomiting, lightheadedness, or chest pain.  On-call PM&R was notified of these events, paged [facility], and consulted hospitalist for admission back to inpatient unit.  Review of Systems:  All other systems reviewed and apart from HPI, are negative.  Past Medical History:  Diagnosis Date   Adult ADHD    Allergic rhinitis    Anxiety    claustrophobic   Bronchopneumonia 11/03/2013   Colon polyps 2012 and 2013   All hyperplastic except one adenomatous w/out high grade dysplasia (recall 2023 per Dr. Deatra Ina)   COVID-19 virus infection 09/29/2020   Elevated transaminase level 2017   Viral hep screening neg.  Suspect fatty liver.   Hyperlipidemia    Hypertension     Obesity    OSA on CPAP    Severe.  CPAP 10 cm H2O.  Follows Dr. Ander Slade.   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 [name] to do surg   Pneumonia    Seborrheic dermatitis of scalp    and face: hydrocort 2% cream bid prn to face, and fluocinonide 0.5% sol'n to scalp bid prn per Dr. Allyson Sabal (04/2014)   Secondary male hypogonadism 04/2021   Type 2 diabetes mellitus with microalbuminuria ([place]) DM dx-09/27/2015. Microalb 2020    Past Surgical History:  Procedure Laterality Date   COLONOSCOPY W/ POLYPECTOMY  04/2010; 10/2011   Initial screening TCS showed 6 polyps (one adenomatous).  Pt says he returned for repeat TCS 10/2011 showed one hyperplastic polyp: Recall 10 yrs per Dr. Samuella Cota SIGMOIDOSCOPY N/A 02/25/2022   Procedure: FLEXIBLE SIGMOIDOSCOPY;  Surgeon: Irving Copas., MD;  Location: [facility];  Service: Gastroenterology;  Laterality: N/A;   HEMOSTASIS CONTROL  02/25/2022   Procedure: HEMOSTASIS CONTROL;  Surgeon: Rush Landmark Telford Nab., MD;  Location: [facility];  Service: Gastroenterology;;   INCISION AND DRAINAGE ABSCESS N/A 11/07/2021   Procedure: Incision and drainage of scrotal abscess;  Surgeon: Leighton Ruff, MD;  Location: [facility];  Service: General;  Laterality: N/A;   LAPAROTOMY N/A 02/07/2022   Procedure: EXPLORATORY LAPAROTOMY, COLON RESECTION, OSTOMY;  Surgeon: Leighton Ruff, MD;  Location: [facility];  Service: General;  Laterality: N/A;   MOUTH SURGERY      Social History:   reports that he quit smoking about 19 years ago. His smoking use included cigarettes.  He smoked an average of 1 pack per day. He has never used smokeless tobacco. He reports current alcohol use of about 7.0 standard drinks of alcohol per week. He reports that he does not use drugs.  Allergies  Allergen Reactions   Lobster [Shellfish Allergy] Nausea Only   Poison Ivy Extract Itching    Family History  Problem Relation Age of Onset   Lung cancer Father    Cancer  Father        nasal   Allergies Father    Colon cancer Neg Hx    Esophageal cancer Neg Hx    Rectal cancer Neg Hx    Stomach cancer Neg Hx      Prior to Admission medications   Medication Sig Start Date End Date Taking? Authorizing Provider  Accu-Chek Softclix Lancets lancets SMARTSIG:Topical 01/19/20   [provider]  acetaminophen (TYLENOL) 325 MG tablet Take 2 tablets (650 mg total) by mouth every 4 (four) hours as needed for mild pain, moderate pain, fever or headache. 03/01/22   Shalhoub, Sherryll Burger, MD  acetaminophen (TYLENOL) 500 MG tablet Take 1,000 mg by mouth every 6 (six) hours as needed for mild pain.    [provider]  alum & mag hydroxide-simeth (MAALOX/MYLANTA) 200-200-20 MG/5ML suspension Take 30 mLs by mouth every 6 (six) hours as needed for indigestion or heartburn (or bloating). 03/01/22   Shalhoub, Sherryll Burger, MD  ascorbic acid (VITAMIN C) 500 MG tablet Take 1 tablet (500 mg total) by mouth 2 (two) times daily. 03/01/22   Shalhoub, Sherryll Burger, MD  atorvastatin (LIPITOR) 40 MG tablet TAKE 1 TABLET BY MOUTH EVERY DAY 02/01/22   McGowen, Adrian Blackwater, MD  cefTRIAXone (ROCEPHIN) 1 g injection Inject 2 g into the muscle daily. 03/01/22   Shalhoub, Sherryll Burger, MD  clotrimazole-betamethasone (LOTRISONE) cream Apply 1 Application topically 2 (two) times daily. 01/23/22   McGowen, Adrian Blackwater, MD  diphenhydrAMINE (BENADRYL) 25 mg capsule Take 25 mg by mouth daily as needed for allergies.    [provider]  diphenoxylate-atropine (LOMOTIL) 2.5-0.025 MG tablet Take 1-2 tablets by mouth 4 (four) times daily as needed for diarrhea or loose stools. 1-2 tabs po qid prn diarrhea 01/12/22   Pyrtle, Lajuan Lines, MD  empagliflozin (JARDIANCE) 10 MG TABS tablet TAKE 1 TABLET BY MOUTH EVERY DAY BEFORE BREAKFAST Patient taking differently: Take 10 mg by mouth daily. 02/05/22   McGowen, Adrian Blackwater, MD  feeding supplement (ENSURE ENLIVE / ENSURE PLUS) LIQD Take 237 mLs by mouth 3 (three)  times daily between meals. 03/01/22   Shalhoub, Sherryll Burger, MD  ferrous sulfate 325 (65 FE) MG tablet Take 1 tablet (325 mg total) by mouth 2 (two) times daily before a meal. 03/01/22   Shalhoub, Sherryll Burger, MD  glucose blood (ACCU-CHEK GUIDE) test strip USE TO TEST BLOOD SUGAR TWICE DAILY 07/12/21   McGowen, Adrian Blackwater, MD  hydrOXYzine (ATARAX) 25 MG tablet Take 1 tablet (25 mg total) by mouth 3 (three) times daily as needed for itching. 03/01/22   Shalhoub, Sherryll Burger, MD  insulin aspart (NOVOLOG) 100 UNIT/ML injection Inject 0-15 Units into the skin 3 (three) times daily with meals. 03/01/22   Shalhoub, Sherryll Burger, MD  insulin aspart (NOVOLOG) 100 UNIT/ML injection Inject 0-5 Units into the skin at bedtime. 03/01/22   Shalhoub, Sherryll Burger, MD  lisinopril (ZESTRIL) 10 MG tablet TAKE 1 TABLET DAILY 01/10/22   McGowen, Adrian Blackwater, MD  metFORMIN (GLUCOPHAGE) 500 MG tablet TAKE 1 TABLET  BY MOUTH TWICE A DAY WITH FOOD 02/01/22   McGowen, Adrian Blackwater, MD  methylphenidate (METADATE CD) 20 MG CR capsule Take 1 capsule (20 mg total) by mouth daily as needed. 03/04/22   McGowen, Adrian Blackwater, MD  methylPREDNISolone sodium succinate (SOLU-MEDROL) 40 mg/mL injection Inject 1 mL (40 mg total) into the vein daily. 03/02/22   Shalhoub, Sherryll Burger, MD  metoCLOPramide (REGLAN) 5 MG tablet Take 1 tablet (5 mg total) by mouth 2 (two) times daily with breakfast and lunch. 03/02/22   Shalhoub, Sherryll Burger, MD  metoprolol tartrate (LOPRESSOR) 25 MG tablet Take 1 tablet (25 mg total) by mouth 2 (two) times daily. 03/01/22 03/01/23  Shalhoub, Sherryll Burger, MD  metroNIDAZOLE (FLAGYL) 500 MG/100ML Inject 100 mLs (500 mg total) into the vein every 12 (twelve) hours. 03/01/22   Shalhoub, Sherryll Burger, MD  Multiple Vitamin (MULTIVITAMIN WITH MINERALS) TABS tablet Take 1 tablet by mouth daily. 03/02/22   Shalhoub, Sherryll Burger, MD  ondansetron (ZOFRAN) 4 MG/2ML SOLN injection Inject 2 mLs (4 mg total) into the vein every 6 (six) hours as needed for nausea or vomiting.  03/01/22   Shalhoub, Sherryll Burger, MD  pantoprazole (PROTONIX) 40 MG tablet Take 1 tablet (40 mg total) by mouth daily. 03/02/22   Shalhoub, Sherryll Burger, MD  polycarbophil (FIBERCON) 625 MG tablet Take 1 tablet (625 mg total) by mouth 2 (two) times daily. 03/01/22   Shalhoub, Sherryll Burger, MD  SYRINGE-NEEDLE, DISP, 3 ML 22G X 1-1/2" 3 ML MISC USE TO ADMINISTER TESTOSTERONE INJECTION EVERY 2 WEEKS 06/06/21   McGowen, Adrian Blackwater, MD  Syringe/Needle, Disp, 18G X 1" 3 ML MISC USE TO ADMINISTER TESTOSTERONE INJECTION EVERY 2 WEEKS 06/06/21   McGowen, Adrian Blackwater, MD  testosterone cypionate (DEPOTESTOSTERONE CYPIONATE) 200 MG/ML injection 200 MG (1 ML) SQ Q 2 WEEKS Patient not taking: Reported on 02/08/2022 12/12/21   [name], MD  thiamine (VITAMIN B-1) 100 MG tablet Take 1 tablet (100 mg total) by mouth daily. 03/02/22   Vernelle Emerald, MD    Physical Exam: Vitals:   03/04/22 1504 03/04/22 1958 03/04/22 2211 03/05/22 0018  BP: 120/84 119/79 118/73 114/82  Pulse: 85 97 (!) 108 88  Resp: (!) '24 17  20  '$ Temp:  99 F (37.2 C)  (!) 97.1 F (36.2 C)  TempSrc:  Oral  Axillary  SpO2: 92% 95%  99%  Weight:      Height:        Constitutional: NAD, calm  Eyes: PERTLA, lids and conjunctivae normal ENMT: Mucous membranes are moist. Posterior pharynx clear of any exudate or lesions.   Neck: supple, no masses  Respiratory:  no wheezing, no crackles. No accessory muscle use.  Cardiovascular: S1 & S2 heard, regular rate and rhythm. Bilateral lower extremity edema. Abdomen: soft, non-tender, brown stool in ostomy bag. Bowel sounds active.  Musculoskeletal: no clubbing / cyanosis. No joint deformity upper and lower extremities.   Skin: no significant rashes, lesions, ulcers. Warm, dry, well-perfused. Neurologic: CN 2-12 grossly intact. Moving all extremities. Alert and oriented.  Psychiatric: Pleasant. Cooperative.    Labs and Imaging on Admission: I have personally reviewed following labs and imaging  studies  CBC: Recent Labs  Lab 02/27/22 0225 02/27/22 0812 02/28/22 0410 03/01/22 0447 03/03/22 1000 03/04/22 0425 03/04/22 1433 03/05/22 0012  WBC 5.3  --  5.4 7.3 13.4* 10.4  --  10.7*  NEUTROABS 4.0  --  4.8 5.7 11.4* 8.0*  --   --  HGB 7.8*   < > 8.4* 8.1* 10.4* 9.4* 10.9* 8.9*  HCT 24.6*   < > 26.6* 25.9* 32.7* 29.2* 32.0* 27.3*  MCV 97.2  --  97.8 99.6 97.6 98.0  --  97.5  PLT 203  --  223 262 359 325  --  328   < > = values in this interval not displayed.   Basic Metabolic Panel: Recent Labs  Lab 02/26/22 0521 02/27/22 0225 02/28/22 0410 03/01/22 0447 03/03/22 1842 03/04/22 0425 03/04/22 1433  NA 140 140 140 142 137 136 138  K 3.5 3.5 4.2 4.0 4.2 3.9 4.0  CL 102 101 101 103 101 98 98  CO2 34* 33* '30 30 28 30  '$ --   GLUCOSE 115* 187* 275* 252* 358* 249* 307*  BUN '11 11 11 16 21 21 17  '$ CREATININE <0.30* 0.42* 0.37* 0.35* 0.57* 0.45* 0.40*  CALCIUM 7.8* 8.0* 8.0* 8.0* 8.5* 8.7*  --   MG 1.8 1.6* 1.8 1.8  --   --   --   PHOS 3.3 2.7 2.7 2.3*  --   --   --    GFR: Estimated Creatinine Clearance: 117.7 mL/min (A) (by C-G formula based on SCr of 0.4 mg/dL (L)). Liver Function Tests: Recent Labs  Lab 02/27/22 0225 02/28/22 0410 03/01/22 0447 03/03/22 1842 03/04/22 0425  AST '16 16 15 '$ 47* 28  ALT '10 10 10 29 24  '$ ALKPHOS 80 98 97 153* 146*  BILITOT 0.4 0.6 0.3 0.5 0.6  PROT 5.3* 6.2* 5.8* 5.8* 5.6*  ALBUMIN 2.8* 2.7* 2.3* 2.3* 2.2*   No results for input(s): "LIPASE", "AMYLASE" in the last 168 hours. No results for input(s): "AMMONIA" in the last 168 hours. Coagulation Profile: Recent Labs  Lab 02/26/22 0816  INR 1.3*   Cardiac Enzymes: No results for input(s): "CKTOTAL", "CKMB", "CKMBINDEX", "TROPONINI" in the last 168 hours. BNP (last 3 results) No results for input(s): "PROBNP" in the last 8760 hours. HbA1C: No results for input(s): "HGBA1C" in the last 72 hours. CBG: Recent Labs  Lab 03/03/22 2046 03/04/22 0600 03/04/22 1123 03/04/22 1640  03/04/22 2048  GLUCAP 306* 219* 178* 288* 282*   Lipid Profile: No results for input(s): "CHOL", "HDL", "LDLCALC", "TRIG", "CHOLHDL", "LDLDIRECT" in the last 72 hours. Thyroid Function Tests: No results for input(s): "TSH", "T4TOTAL", "FREET4", "T3FREE", "THYROIDAB" in the last 72 hours. Anemia Panel: No results for input(s): "VITAMINB12", "FOLATE", "FERRITIN", "TIBC", "IRON", "RETICCTPCT" in the last 72 hours. Urine analysis:    Component Value Date/Time   COLORURINE AMBER (A) 02/09/2022 1213   APPEARANCEUR CLOUDY (A) 02/09/2022 1213   LABSPEC 1.041 (H) 02/09/2022 1213   PHURINE 5.0 02/09/2022 1213   GLUCOSEU >=500 (A) 02/09/2022 1213   GLUCOSEU NEGATIVE 06/13/2011 0953   HGBUR NEGATIVE 02/09/2022 1213   BILIRUBINUR NEGATIVE 02/09/2022 1213   KETONESUR 20 (A) 02/09/2022 1213   PROTEINUR 30 (A) 02/09/2022 1213   UROBILINOGEN 0.2 06/13/2011 0953   NITRITE NEGATIVE 02/09/2022 1213   LEUKOCYTESUR NEGATIVE 02/09/2022 1213   Sepsis Labs: '@LABRCNTIP'$ (procalcitonin:4,lacticidven:4) ) Recent Results (from the past 240 hour(s))  Aerobic/Anaerobic Culture w Gram Stain (surgical/deep wound)     Status: None   Collection Time: 02/26/22 12:24 PM   Specimen: Abscess  Result Value Ref Range Status   Specimen Description   Final    ABSCESS LLQ ABDOMINAL Performed at [facility], [place] [address] [place], [place] [address]    Special Requests   Final    NONE Performed at [facility]  [place] [address], [place], [place] [address]    Gram Stain   Final    ABUNDANT WBC PRESENT, PREDOMINANTLY PMN MODERATE GRAM NEGATIVE RODS    Culture   Final    ABUNDANT ESCHERICHIA COLI FEW BACTEROIDES THETAIOTAOMICRON BETA LACTAMASE NEGATIVE Performed at [facility], [place] [address], [place], [place] [address]    Report Status 03/02/2022 FINAL  Final   Organism ID, Bacteria ESCHERICHIA COLI  Final      Susceptibility   Escherichia coli - MIC*     AMPICILLIN >=32 RESISTANT Resistant     CEFAZOLIN <=4 SENSITIVE Sensitive     CEFEPIME <=0.12 SENSITIVE Sensitive     CEFTAZIDIME <=1 SENSITIVE Sensitive     CEFTRIAXONE <=0.25 SENSITIVE Sensitive     CIPROFLOXACIN >=4 RESISTANT Resistant     GENTAMICIN >=16 RESISTANT Resistant     IMIPENEM <=0.25 SENSITIVE Sensitive     TRIMETH/SULFA <=20 SENSITIVE Sensitive     AMPICILLIN/SULBACTAM 16 INTERMEDIATE Intermediate     PIP/TAZO <=4 SENSITIVE Sensitive     * ABUNDANT ESCHERICHIA COLI  Body fluid culture w Gram Stain     Status: None   Collection Time: 02/26/22 12:24 PM   Specimen: Ascitic; Body Fluid  Result Value Ref Range Status   Specimen Description   Final    ASCITIC Performed at [place] [address] [place], [place] [address]    Special Requests   Final    NONE Performed at [facility], [place] [address], [place], [place] [address]    Gram Stain   Final    WBC PRESENT, PREDOMINANTLY MONONUCLEAR NO ORGANISMS SEEN CYTOSPIN SMEAR    Culture   Final    NO GROWTH 3 DAYS Performed at [facility], [place] [address] [place], [place] [address]    Report Status 03/02/2022 FINAL  Final     Radiological Exams on Admission: CT ABDOMEN PELVIS W CONTRAST  Result Date: 03/04/2022 CLINICAL DATA:  Left lower quadrant abscess status post IR drainage. Crohn's disease. EXAM: CT ABDOMEN AND PELVIS WITH CONTRAST TECHNIQUE: Multidetector CT imaging of the abdomen and pelvis was performed using the standard protocol following bolus administration of intravenous contrast. RADIATION DOSE REDUCTION: This exam was performed according to the departmental dose-optimization program which includes automated exposure control, adjustment of the mA and/or kV according to patient size and/or use of iterative reconstruction technique. CONTRAST:  72m OMNIPAQUE IOHEXOL 350 MG/ML SOLN COMPARISON:  CTA abdomen and pelvis 02/25/2022 FINDINGS: Lower chest: There are  stable small bilateral pleural effusions with compressive atelectasis of the bilateral lower lobes. Hepatobiliary: No focal liver abnormality is seen. No gallstones, gallbladder wall thickening, or biliary dilatation. Pancreas: Unremarkable. No pancreatic ductal dilatation or surrounding inflammatory changes. Spleen: Normal in size without focal abnormality. Adrenals/Urinary Tract: Adrenal glands are unremarkable. Kidneys are normal, without renal calculi, focal lesion, or hydronephrosis. Bladder is unremarkable. Stomach/Bowel: Left lower quadrant colostomy again noted. Rectal stump is distended with stool similar to the prior study. There is no evidence for bowel obstruction, pneumatosis or free air. There are some distended small bowel loops without transition point in the left abdomen which may be related to ileus. Stomach is within normal limits. Vascular/Lymphatic: Aortic atherosclerosis. No enlarged abdominal or pelvic lymph nodes. Reproductive: Prostate is unremarkable. Other: There is small volume ascites which has mildly decreased. Mesenteric edema in the left abdomen has decreased. There is a new percutaneous drainage catheter ending in left lower quadrant.  Left lower quadrant fluid collection has resolved in the interval. No new enhancing fluid collections are seen. Presacral edema is stable. Body wall edema is stable. Musculoskeletal: Degenerative changes affect the spine IMPRESSION: 1. Interval placement of percutaneous drainage catheter in the left lower quadrant. Left lower quadrant fluid collection has resolved in the interval. No new fluid collection identified. 2. Small volume ascites has mildly decreased. 3. Stable small bilateral pleural effusions with compressive atelectasis of the bilateral lower lobes. 4. Mildly distended small bowel loops without transition point may be related to ileus. 5. Stable presacral and body wall edema. Aortic Atherosclerosis (ICD10-I70.0). Electronically Signed   By:  Ronney Asters M.D.   On: 03/04/2022 22:24   DG Chest 2 View  Result Date: 03/03/2022 CLINICAL DATA:  200808 Hypoxia 200808 [id number] Abnormal CXR [id number] EXAM: PORTABLE CHEST two views COMPARISON:  03/02/2022 FINDINGS: Cardiac silhouette is prominent. There is pulmonary interstitial prominence with vascular congestion. No focal consolidation. No pneumothorax identified. Large bilateral pleural effusions moderate layering bilateral pleural effusions. Right-sided PICC tip overlies mid SVC. IMPRESSION: Findings suggest CHF. Electronically Signed   By: Sammie Bench M.D.   On: 03/03/2022 12:59     Assessment/Plan   1. Acute lower GI bleeding  - Large volume of hematochezia overnight prompted hospitalist consultation for admission back to inpatient unit  - There is no upper GI s/s and no blood from colostomy  - He underwent flex sig 12/18 with multiple rectal ulcers treated with hemostatic gel - He is hemodynamically stable with Hgb 8.9 (9.4 twenty hrs earlier)  - PM&R PA is consulting with  GI  - Type and screen, keep npo for now, admit back to inpatient unit, trend H&H, transfuse as needed    2. Intraabdominal abscess  - Percutaneous drain placed by IR on 02/26/22 - Cultures growing E coli and Bacteroides  - Continue Rocephin and Flagyl, drain care    3. Crohn's disease  - Continue Solu-Medrol   4. Type II DM  - A1c was 6.1% in December 2023  - Continue CBG checks and SSI   5. CHF; acute hypoxic respiratory failure   - Cardiology was consulted 03/03/22 for new supplemental O2 requirement with elevated BNP and CHF findings on CXR  - EF unknown, echo is pending  - He is not in any respiratory distress on admission - plan to hold diuretics initially given acute GIB    6. Debility  - He will likely return to inpatient rehab once acute medical issues stabilized    DVT prophylaxis: SCDs  Code Status: Full  Level of Care: Level of care:  Family Communication: none present   Disposition Plan:  Patient is from: Inpatient rehab Anticipated d/c is to: Inpatient rehab  Anticipated d/c date is: 03/06/22  Patient currently: Pending GI consultation, stable H&H  Consults called: GI (Dr. Fuller Plan) consulted by PM&R PA Admission status: Observation     Vianne Bulls, MD [facility]  03/05/2022, 2:16 AM

## 2022-03-05 NOTE — Progress Notes (Addendum)
Message sent to hospitalist, awaiting a return call.  Spoke with Dr Myna Hidalgo at 01:04, he will come to assess Mr. Ingwersen. Call placed to [place], regarding the above. He verbalizes understanding.

## 2022-03-05 NOTE — Progress Notes (Addendum)
On call provider and charge nurse made aware of ongoing issue with patient. At 2240 patient briefs, chuck pads, and fitted sheet have blood presence on them. Frank blood present bright in color on brief and patient. As well as thick clots medium in size. Older blood present as well and darken in color. Has external condom catheter that was cleaned as well as peri care and hygiene care performed. Charge nurse was contacted to assist with focus assessment.  At 2330 patient again requested assistance due to discomfort experiencing, explained would return at 2240 around 0000 to assist in changing him. Returned earlier to clean patient and assess current needs of patient. At this time patient passing thick dark in color and copious amounts of clots. Moderate bright red blood presence as well. Per on call provider verbal order to obtain stat blood work placed as well as obtain new set of vitals. Will continue to monitor situation and update accordingly.  Shelah Lewandowsky, LPN

## 2022-03-05 NOTE — Progress Notes (Signed)
Inpatient Rehabilitation Discharge Medication Review by a Pharmacist  A complete drug regimen review was completed for this patient to identify any potential clinically significant medication issues.  High Risk Drug Classes Is patient taking? Indication by Medication  Antipsychotic Yes Compazine -  prn N/V  Anticoagulant No   Antibiotic Yes, as an intravenous medication IV ceftriaxone/metronidazole - abdominal infection  Opioid No   Antiplatelet No   Hypoglycemics/insulin Yes SSI - DM  Vasoactive Medication Yes Metoprolol - HTN  Chemotherapy No   Other Yes Trazodone - prn sleep Zofran prn - refractory N/V Atorvastatin - HLD Hydroxyzine prn itching Solu-medrol - Peritonitis  Metoclopramide, Pantoprazole - Gerd  Thiamine - Supplement     Type of Medication Issue Identified Description of Issue Recommendation(s)  Drug Interaction(s) (clinically significant)     Duplicate Therapy     Allergy     No Medication Administration End Date     Incorrect Dose     Additional Drug Therapy Needed     Significant med changes from prior encounter (inform family/care partners about these prior to discharge).    Other       Clinically significant medication issues were identified that warrant physician communication and completion of prescribed/recommended actions by midnight of the next day:  No  Pharmacist comments: PO diabetes regimen on hold - metformin/Jardiance Blood pressure medications held- Lisinopril  Time spent performing this drug regimen review (minutes):  30 minutes  Thank you Anette Guarneri, PharmD

## 2022-03-05 NOTE — Progress Notes (Signed)
Received a call from Nurse reporting,Richard [name] is passing blood clots and has saturated bed pad X 3. Call placed to GI: awaiting call from Dr Fuller Plan. Asked nurse to obtain recent vitals and stat CBC ordered,  Reviewed Dr [name] note from today.  S/P Emergent Flexible Sigmoidoscopy.

## 2022-03-05 NOTE — Progress Notes (Signed)
Progress Note     Subjective: Pt had recurrence of rectal bleeding yesterday and underwent flex-sig. Transferred back to inpatient from CIR. Patient and wife are frustrated this AM because they feel like they aren't getting the whole picture with complete information at times. Patient denies abdominal or rectal pain. Still having some blood from rectum overnight. Denies nausea or vomiting, ostomy productive.   Objective: Vital signs in last 24 hours: Temp:  [97.1 F (36.2 C)-99.3 F (37.4 C)] 98.3 F (36.8 C) (12/19 0735) Pulse Rate:  [85-110] 110 (12/19 0735) Resp:  [17-25] 17 (12/19 0735) BP: (114-129)/(71-84) 118/77 (12/19 0735) SpO2:  [92 %-99 %] 97 % (12/19 0735)    Intake/Output from previous day: No intake/output data recorded. Intake/Output this shift: No intake/output data recorded.  PE: General: pleasant, WD, obese male who is laying in bed in NAD Heart: regular, rate, and rhythm.   Lungs: Respiratory effort nonlabored on [place] Abd: soft, NT, mild distention, ostomy productive of liquid stool, stoma viable and patent, midline wound clean with small amount fibrinous exudate centrally, drain in LLQ with scant drainage   Lab Results:  Recent Labs    03/04/22 0425 03/04/22 1433 03/05/22 0012  WBC 10.4  --  10.7*  HGB 9.4* 10.9* 8.9*  HCT 29.2* 32.0* 27.3*  PLT 325  --  328   BMET Recent Labs    03/03/22 1842 03/04/22 0425 03/04/22 1433  NA 137 136 138  K 4.2 3.9 4.0  CL 101 98 98  CO2 28 30  --   GLUCOSE 358* 249* 307*  BUN '21 21 17  '$ CREATININE 0.57* 0.45* 0.40*  CALCIUM 8.5* 8.7*  --    PT/INR No results for input(s): "LABPROT", "INR" in the last 72 hours. CMP     Component Value Date/Time   NA 138 03/04/2022 1433   K 4.0 03/04/2022 1433   CL 98 03/04/2022 1433   CO2 30 03/04/2022 0425   GLUCOSE 307 (H) 03/04/2022 1433   BUN 17 03/04/2022 1433   CREATININE 0.40 (L) 03/04/2022 1433   CREATININE 0.63 (L) 03/10/2019 1436   CALCIUM 8.7 (L)  03/04/2022 0425   PROT 5.6 (L) 03/04/2022 0425   ALBUMIN 2.2 (L) 03/04/2022 0425   AST 28 03/04/2022 0425   ALT 24 03/04/2022 0425   ALKPHOS 146 (H) 03/04/2022 0425   BILITOT 0.6 03/04/2022 0425   GFRNONAA >60 03/04/2022 0425   Lipase  No results found for: "LIPASE"     Studies/Results: CT ABDOMEN PELVIS W CONTRAST  Result Date: 03/04/2022 CLINICAL DATA:  Left lower quadrant abscess status post IR drainage. Crohn's disease. EXAM: CT ABDOMEN AND PELVIS WITH CONTRAST TECHNIQUE: Multidetector CT imaging of the abdomen and pelvis was performed using the standard protocol following bolus administration of intravenous contrast. RADIATION DOSE REDUCTION: This exam was performed according to the departmental dose-optimization program which includes automated exposure control, adjustment of the mA and/or kV according to patient size and/or use of iterative reconstruction technique. CONTRAST:  34m OMNIPAQUE IOHEXOL 350 MG/ML SOLN COMPARISON:  CTA abdomen and pelvis 02/25/2022 FINDINGS: Lower chest: There are stable small bilateral pleural effusions with compressive atelectasis of the bilateral lower lobes. Hepatobiliary: No focal liver abnormality is seen. No gallstones, gallbladder wall thickening, or biliary dilatation. Pancreas: Unremarkable. No pancreatic ductal dilatation or surrounding inflammatory changes. Spleen: Normal in size without focal abnormality. Adrenals/Urinary Tract: Adrenal glands are unremarkable. Kidneys are normal, without renal calculi, focal lesion, or hydronephrosis. Bladder is unremarkable. Stomach/Bowel: Left lower quadrant  colostomy again noted. Rectal stump is distended with stool similar to the prior study. There is no evidence for bowel obstruction, pneumatosis or free air. There are some distended small bowel loops without transition point in the left abdomen which may be related to ileus. Stomach is within normal limits. Vascular/Lymphatic: Aortic atherosclerosis. No  enlarged abdominal or pelvic lymph nodes. Reproductive: Prostate is unremarkable. Other: There is small volume ascites which has mildly decreased. Mesenteric edema in the left abdomen has decreased. There is a new percutaneous drainage catheter ending in left lower quadrant. Left lower quadrant fluid collection has resolved in the interval. No new enhancing fluid collections are seen. Presacral edema is stable. Body wall edema is stable. Musculoskeletal: Degenerative changes affect the spine IMPRESSION: 1. Interval placement of percutaneous drainage catheter in the left lower quadrant. Left lower quadrant fluid collection has resolved in the interval. No new fluid collection identified. 2. Small volume ascites has mildly decreased. 3. Stable small bilateral pleural effusions with compressive atelectasis of the bilateral lower lobes. 4. Mildly distended small bowel loops without transition point may be related to ileus. 5. Stable presacral and body wall edema. Aortic Atherosclerosis (ICD10-I70.0). Electronically Signed   By: Ronney Asters M.D.   On: 03/04/2022 22:24   DG Chest 2 View  Result Date: 03/03/2022 CLINICAL DATA:  200808 Hypoxia 200808 [id number] Abnormal CXR [id number] EXAM: PORTABLE CHEST two views COMPARISON:  03/02/2022 FINDINGS: Cardiac silhouette is prominent. There is pulmonary interstitial prominence with vascular congestion. No focal consolidation. No pneumothorax identified. Large bilateral pleural effusions moderate layering bilateral pleural effusions. Right-sided PICC tip overlies mid SVC. IMPRESSION: Findings suggest CHF. Electronically Signed   By: Sammie Bench M.D.   On: 03/03/2022 12:59    Anti-infectives: Anti-infectives (From admission, onward)    Start     Dose/Rate Route Frequency Ordered Stop   03/05/22 2000  metroNIDAZOLE (FLAGYL) IVPB 500 mg        500 mg 100 mL/hr over 60 Minutes Intravenous Every 12 hours 03/05/22 0705     03/05/22 1800  cefTRIAXone (ROCEPHIN) 2 g in  sodium chloride 0.9 % 100 mL IVPB        2 g 200 mL/hr over 30 Minutes Intravenous Every 24 hours 03/05/22 0705     03/05/22 0800  cefTRIAXone (ROCEPHIN) injection 2 g  Status:  Discontinued        2 g Intramuscular Every 24 hours 03/05/22 0705 03/05/22 0716        Assessment/Plan   Sigmoid perforation  POD26 S/P exploratory laparotomy, colon resection, end colostomy 02/07/22 Dr. [name] - having ostomy output, continue fiber and iron   - CT 12/1 with some free fluid but no organized collection, CT 12/5 with no major changes - BRBPR 12/11 - s/p flex-sig which showed bleeding ulcerations. CTA also showed LLQ abscess - s/p IR drain placement and paracentesis 12/12 - Cxs with E.coli, abx narrowed to rocephin/flagyl - had recurrent rectal bleeding yesterday - s/p flex sig with no active bleeding but ulcerations still present  - hgb 8.9 this AM - rectal findings may be more related to exclusion proctitis rather than Crohn's given ulcerations despite steroids - discussed with GI yesterday and agree on trial of small volume enemas with SCFA. This will need to be compounded and may not be available for 48 hrs - steroids for acute Crohn's initiated 12/13 - CT overnight with resolution in LLQ collection and decrease in small volume ascites - will have IR eval for possible drain removal  -  GI considering remicade as well - recommend holding off on colonoscopy currently to avoid putting extra tension on stoma acutely     FEN: CM diet; cont iron and vit c VTE: LMWH on hold  ID: vanc/flagyl/cefepime 11/23; zosyn 11/24>11/29; vanc 12/1>12/4; merrem 12/1> 12/5; Zosyn 12/11>12/15; Rocephin/flagyl 12/15>>   - below per primary -  Post-operative respiratory insufficiency - concern for possible CHF, cardiology was consulting and planning echo today  T2DM Physical deconditioning   HTN HLD Obesity class I OSA Hypogonadism Moderate protein calorie malnutrition  ADHD/Anxiety     LOS: 0 days      Norm Parcel, [facility] 03/05/2022, 9:14 AM Please see Amion for pager number during day hours 7:00am-4:30pm

## 2022-03-05 NOTE — Progress Notes (Signed)
Rounding Note    Patient Name: Richard [name] Date of Encounter: 03/05/2022  [place] Cardiologist: Werner Lean, MD   Subjective   GI bleed; admitted to inpatient. Now that they are hearing the same thing from all providers patient and family feel better about their care.  Inpatient Medications    Scheduled Meds:  atorvastatin  40 mg Oral Daily   Chlorhexidine Gluconate Cloth  6 each Topical BID   guaiFENesin  600 mg Oral BID   insulin aspart  0-15 Units Subcutaneous TID WC   insulin aspart  0-5 Units Subcutaneous QHS   insulin aspart  3 Units Subcutaneous TID WC   lip balm  1 Application Topical BID   methylPREDNISolone (SOLU-MEDROL) injection  40 mg Intravenous Daily   pantoprazole  40 mg Oral Daily   [START ON 03/07/2022] Short Chain Fatty Acid Enema  1 enema Rectal Daily   sodium chloride flush  10-40 mL Intracatheter Q12H   sodium chloride flush  3 mL Intravenous Q12H   thiamine  100 mg Oral Daily   zinc sulfate  220 mg Oral Daily   Continuous Infusions:  cefTRIAXone (ROCEPHIN)  IV     metroNIDAZOLE     PRN Meds: acetaminophen **OR** acetaminophen, alum & mag hydroxide-simeth, diphenhydrAMINE, guaiFENesin-dextromethorphan, hydrocortisone cream, hydrOXYzine, magic mouthwash, menthol-cetylpyridinium, naphazoline-glycerin, ondansetron (ZOFRAN) IV, mouth rinse, phenol, prochlorperazine **OR** prochlorperazine **OR** prochlorperazine, simethicone, sodium chloride, sodium chloride flush, traZODone   Vital Signs    Vitals:   03/05/22 0649  BP: (P) 120/81  Pulse: (!) (P) 110  Resp: (P) 19  Temp: (P) 98.6 F (37 C)  TempSrc: (P) Oral  SpO2: (P) 97%   No intake or output data in the 24 hours ending 03/05/22 1224     03/01/2022    2:32 PM 02/28/2022    4:11 AM 02/27/2022    4:47 AM  Last 3 Weights  Weight (lbs) 264 lb 5.3 oz 247 lb 5.7 oz 251 lb 15.8 oz  Weight (kg) 119.9 kg 112.2 kg 114.3 kg      Telemetry    Sinus tachycardia-  Personally Reviewed  ECG    No new tracings - Personally Reviewed  Physical Exam   GEN: No acute distress.   Cardiac: RRR, no murmurs, rubs, or gallops.  Respiratory: slightly diminished in bases GI: Soft, nontender, non-distended  MS: 3+ pitting edema to knees bilaterally Neuro:  Nonfocal  Psych: Normal affect   Labs    High Sensitivity Troponin:   Recent Labs  Lab 02/07/22 2030 02/07/22 2255  TROPONINIHS 10 14     Chemistry Recent Labs  Lab 02/27/22 0225 02/28/22 0410 03/01/22 0447 03/03/22 1842 03/04/22 0425 03/04/22 1433  NA 140 140 142 137 136 138  K 3.5 4.2 4.0 4.2 3.9 4.0  CL 101 101 103 101 98 98  CO2 33* '30 30 28 30  '$ --   GLUCOSE 187* 275* 252* 358* 249* 307*  BUN '11 11 16 21 21 17  '$ CREATININE 0.42* 0.37* 0.35* 0.57* 0.45* 0.40*  CALCIUM 8.0* 8.0* 8.0* 8.5* 8.7*  --   MG 1.6* 1.8 1.8  --   --   --   PROT 5.3* 6.2* 5.8* 5.8* 5.6*  --   ALBUMIN 2.8* 2.7* 2.3* 2.3* 2.2*  --   AST '16 16 15 '$ 47* 28  --   ALT '10 10 10 29 24  '$ --   ALKPHOS 80 98 97 153* 146*  --   BILITOT 0.4 0.6  0.3 0.5 0.6  --   GFRNONAA >60 >60 >60 >60 >60  --   ANIONGAP '6 9 9 8 8  '$ --     Lipids No results for input(s): "CHOL", "TRIG", "HDL", "LABVLDL", "LDLCALC", "CHOLHDL" in the last 168 hours.  Hematology Recent Labs  Lab 03/03/22 1000 03/04/22 0425 03/04/22 1433 03/05/22 0012  WBC 13.4* 10.4  --  10.7*  RBC 3.35* 2.98*  --  2.80*  HGB 10.4* 9.4* 10.9* 8.9*  HCT 32.7* 29.2* 32.0* 27.3*  MCV 97.6 98.0  --  97.5  MCH 31.0 31.5  --  31.8  MCHC 31.8 32.2  --  32.6  RDW 15.6* 15.6*  --  15.9*  PLT 359 325  --  328   Thyroid No results for input(s): "TSH", "FREET4" in the last 168 hours.  BNP Recent Labs  Lab 03/03/22 1842 03/04/22 0425  BNP 152.8* 105.7*    DDimer No results for input(s): "DDIMER" in the last 168 hours.   Radiology    ECHOCARDIOGRAM COMPLETE  Result Date: 03/05/2022    ECHOCARDIOGRAM REPORT   Patient Name:   Richard [name] Date of Exam: 03/05/2022  Medical Rec #:  [id number]      Height:       69.0 in Accession #:    [id number]     Weight:       264.3 lb Date of Birth:  10-11-1955      BSA:          2.326 m Patient Age:    66 years       BP:           118/77 mmHg Patient Gender: M              HR:           115 bpm. Exam Location:  Inpatient Procedure: 2D Echo, Cardiac Doppler and Color Doppler Indications:    CHF  History:        Patient has no prior history of Echocardiogram examinations.                 Risk Factors:Diabetes, Sleep Apnea, Dyslipidemia and                 Hypertension.  Sonographer:    Clayton Lefort RDCS (AE) Referring Phys: [id number] [facility]  Sonographer Comments: Limited patient mobility. IMPRESSIONS  1. Left ventricular ejection fraction, by estimation, is 60 to 65%. The left ventricle has normal function. The left ventricle has no regional wall motion abnormalities. Indeterminate diastolic filling due to E-A fusion.  2. Right ventricular systolic function is normal. The right ventricular size is normal. Tricuspid regurgitation signal is inadequate for assessing PA pressure.  3. The mitral valve is grossly normal. Trivial mitral valve regurgitation. No evidence of mitral stenosis.  4. The aortic valve is tricuspid. Aortic valve regurgitation is not visualized. No aortic stenosis is present.  5. The inferior vena cava is normal in size with greater than 50% respiratory variability, suggesting right atrial pressure of 3 mmHg. FINDINGS  Left Ventricle: Left ventricular ejection fraction, by estimation, is 60 to 65%. The left ventricle has normal function. The left ventricle has no regional wall motion abnormalities. The left ventricular internal cavity size was normal in size. There is  no left ventricular hypertrophy. Indeterminate diastolic filling due to E-A fusion. Right Ventricle: The right ventricular size is normal. No increase in right ventricular wall thickness. Right ventricular systolic function is normal. Tricuspid  regurgitation signal is inadequate for assessing PA pressure. Left Atrium: Left atrial size was normal in size. Right Atrium: Right atrial size was normal in size. Pericardium: Trivial pericardial effusion is present. Presence of epicardial fat layer. Mitral Valve: The mitral valve is grossly normal. Trivial mitral valve regurgitation. No evidence of mitral valve stenosis. Tricuspid Valve: The tricuspid valve is grossly normal. Tricuspid valve regurgitation is not demonstrated. No evidence of tricuspid stenosis. Aortic Valve: The aortic valve is tricuspid. Aortic valve regurgitation is not visualized. No aortic stenosis is present. Aortic valve mean gradient measures 4.0 mmHg. Aortic valve peak gradient measures 8.4 mmHg. Aortic valve area, by VTI measures 2.98 cm. Pulmonic Valve: The pulmonic valve was grossly normal. Pulmonic valve regurgitation is not visualized. No evidence of pulmonic stenosis. Aorta: The aortic root and ascending aorta are structurally normal, with no evidence of dilitation. Venous: The inferior vena cava is normal in size with greater than 50% respiratory variability, suggesting right atrial pressure of 3 mmHg. IAS/Shunts: The atrial septum is grossly normal.  LEFT VENTRICLE PLAX 2D LVIDd:         4.20 cm LVIDs:         2.60 cm LV PW:         1.30 cm LV IVS:        1.10 cm LVOT diam:     2.20 cm LV SV:         59 LV SV Index:   25 LVOT Area:     3.80 cm  RIGHT VENTRICLE RV Basal diam:  2.80 cm RV S prime:     19.00 cm/s TAPSE (M-mode): 2.0 cm LEFT ATRIUM             Index        RIGHT ATRIUM           Index LA diam:        2.70 cm 1.16 cm/m   RA Area:     13.60 cm LA Vol (A2C):   23.7 ml 10.19 ml/m  RA Volume:   31.10 ml  13.37 ml/m LA Vol (A4C):   25.2 ml 10.83 ml/m LA Biplane Vol: 25.4 ml 10.92 ml/m  AORTIC VALVE AV Area (Vmax):    3.01 cm AV Area (Vmean):   3.03 cm AV Area (VTI):     2.98 cm AV Vmax:           145.00 cm/s AV Vmean:          94.300 cm/s AV VTI:            0.198 m  AV Peak Grad:      8.4 mmHg AV Mean Grad:      4.0 mmHg LVOT Vmax:         115.00 cm/s LVOT Vmean:        75.200 cm/s LVOT VTI:          0.155 m LVOT/AV VTI ratio: 0.78  AORTA Ao Root diam: 3.80 cm Ao Asc diam:  3.90 cm  SHUNTS Systemic VTI:  0.16 m Systemic Diam: 2.20 cm Eleonore Chiquito MD Electronically signed by Eleonore Chiquito MD Signature Date/Time: 03/05/2022/9:58:22 AM    Final    CT ABDOMEN PELVIS W CONTRAST  Result Date: 03/04/2022 CLINICAL DATA:  Left lower quadrant abscess status post IR drainage. Crohn's disease. EXAM: CT ABDOMEN AND PELVIS WITH CONTRAST TECHNIQUE: Multidetector CT imaging of the abdomen and pelvis was performed using the standard protocol following bolus administration of intravenous contrast. RADIATION  DOSE REDUCTION: This exam was performed according to the departmental dose-optimization program which includes automated exposure control, adjustment of the mA and/or kV according to patient size and/or use of iterative reconstruction technique. CONTRAST:  62m OMNIPAQUE IOHEXOL 350 MG/ML SOLN COMPARISON:  CTA abdomen and pelvis 02/25/2022 FINDINGS: Lower chest: There are stable small bilateral pleural effusions with compressive atelectasis of the bilateral lower lobes. Hepatobiliary: No focal liver abnormality is seen. No gallstones, gallbladder wall thickening, or biliary dilatation. Pancreas: Unremarkable. No pancreatic ductal dilatation or surrounding inflammatory changes. Spleen: Normal in size without focal abnormality. Adrenals/Urinary Tract: Adrenal glands are unremarkable. Kidneys are normal, without renal calculi, focal lesion, or hydronephrosis. Bladder is unremarkable. Stomach/Bowel: Left lower quadrant colostomy again noted. Rectal stump is distended with stool similar to the prior study. There is no evidence for bowel obstruction, pneumatosis or free air. There are some distended small bowel loops without transition point in the left abdomen which may be related to ileus.  Stomach is within normal limits. Vascular/Lymphatic: Aortic atherosclerosis. No enlarged abdominal or pelvic lymph nodes. Reproductive: Prostate is unremarkable. Other: There is small volume ascites which has mildly decreased. Mesenteric edema in the left abdomen has decreased. There is a new percutaneous drainage catheter ending in left lower quadrant. Left lower quadrant fluid collection has resolved in the interval. No new enhancing fluid collections are seen. Presacral edema is stable. Body wall edema is stable. Musculoskeletal: Degenerative changes affect the spine IMPRESSION: 1. Interval placement of percutaneous drainage catheter in the left lower quadrant. Left lower quadrant fluid collection has resolved in the interval. No new fluid collection identified. 2. Small volume ascites has mildly decreased. 3. Stable small bilateral pleural effusions with compressive atelectasis of the bilateral lower lobes. 4. Mildly distended small bowel loops without transition point may be related to ileus. 5. Stable presacral and body wall edema. Aortic Atherosclerosis (ICD10-I70.0). Electronically Signed   By: ARonney AstersM.D.   On: 03/04/2022 22:24   DG Chest 2 View  Result Date: 03/03/2022 CLINICAL DATA:  200808 Hypoxia 200808 [id number] CXR [id number]: PORTABLE CHEST two views COMPARISON:  03/02/2022 FINDINGS: Cardiac silhouette is prominent. There is pulmonary interstitial prominence with vascular congestion. No focal consolidation. No pneumothorax identified. Large bilateral pleural effusions moderate layering bilateral pleural effusions. Right-sided PICC tip overlies mid SVC. IMPRESSION: Findings suggest CHF. Electronically Signed   By: JSammie BenchM.D.   On: 03/03/2022 12:59      Patient Profile     66y.o. male with a history of GI issues with question of HF. Prolonged [facility] hospitalization with peritonitis requiring exlap. Now in CIR, cardiology consulted for new O2 requirement and  edema.  Assessment & Plan   Volume Overload Normal LV function Normal later E prime velocity  - Concerned that his volume overload is related to hypoalbuminenia - continue 40 mg IV  K WNL, Mg 1.6 --> 1.8 - sinus tachycardia is compensatory BP well controlled on 6.25 mg lopressor BID - tomorrow will attempt to wean O2 and will review echo with patient and family  Low albumin Protein calorie malnutrition Obesity Orthostatic heart rate Echo as above Edema may be difficult to control given albumin       For questions or updates, please contact [place]Please consult [email or web address] for contact info under    MRudean Haskell MD [address] [place] [phone] 12:24 PM

## 2022-03-05 NOTE — Progress Notes (Signed)
Inpatient Rehab Admissions Coordinator:    I spoke with pt. About desire to return to CIR. Pt. Would like to go home but recognizes that he needs rehab first. I will open case with insurance for CIR once PT/OT have seen.  Clemens Catholic, [address], [place] Admissions Coordinator  [phone] ([place]) [phone] (office)

## 2022-03-05 NOTE — Progress Notes (Signed)
Occupational Therapy Discharge Note  This patient was unable to complete the inpatient rehab program due to change in medical condition therefore did not meet their long term goals. Pt left the program at a Total A assist level for their  functional ADLs. This patient is being discharged from OT services at this time.  BIMS at time of d/c  Pt unable to complete due to medical status  See CareTool for functional status details.  If the patient is able to return to inpatient rehabilitation within 3 midnights, this may be considered an interrupted stay and therapy services will resume as ordered. Modification and reinstatement of their goals will be made upon completion of therapy service reevaluations.

## 2022-03-05 NOTE — Progress Notes (Signed)
Patient transferred via hospital bed. Charge Nurse and Nurse tech transferring patient to unit. Report given to nurse on the unit receiving patient.  Shelah Lewandowsky, LPN

## 2022-03-05 NOTE — Progress Notes (Signed)
Notified that patient transferred to acute this am. Review education and care plan of which both have been completed.

## 2022-03-05 NOTE — PMR Pre-admission (Signed)
PMR Admission Coordinator Pre-Admission Assessment  Patient: Richard [name] is an 66 y.o., male MRN: [id number] DOB: 1955-04-09 Height:   Weight:    Insurance Information HMO:     PPO: yes     PCP:      IPA:      80/20:      OTHER:  PRIMARY: [employer] Generic      Policy#: [id number]      Subscriber: Pt.  CM Name: Delray Alt      Phone#: [phone]     Fax#: [phone] Pre-Cert#: [id number] approval from 12/21 for 7 days with update due on 03/14/22 by 1 pm.      Employer:  Benefits:  Phone #:      Name:  Irene Shipper Date: 03/18/2018 - still active Deductible: $1,000 ($1,000 met) OOP Max: $5,500 ($5,500 met) CIR: $300 copay/admission, then 70% coverage, 30% co-insurance SNF: 70% coverage, 30% co-insurance Outpatient: 70% coverage, 30% co-insurance Home Health:  70% coverage, 30% co-insurance DME: 70% coverage, 30% co-insurance  SECONDARY:       Policy#:      Phone#:   Development worker, community:       Phone#:   The Therapist, art Information Summary" for patients in Inpatient Rehabilitation Facilities with attached "Privacy Act [place] Records" was provided and verbally reviewed with: N/A  Emergency Contact Information Contact Information     Name Relation Home Work Mobile   Shaniko Spouse [phone]  [phone]       Current Medical History  Patient Admitting Diagnosis: Debility, GI bleed History of Present Illness: Pt is a 66 yo male former smoker who  developed diarrhea about 3 to 4 weeks prior to admission.  He was seen by Dr. [name] with GI and had colonoscopy on 02/04/22.  This showed diffuse, severe inflammation throughout the colon with mucosal friability. Biopsies were performed.  He was found to have Crohn's colitis and started on prednisone.  He started having discomfort in his abdomen, and  on 02/08/22 Pt. Was brought to the [facility] ED. On admission, he had fever 102.1 F with tachycardia.  CT abd/pelvis showed findings compatible with  perforated bowel with moderate free fluid and free air.  He was started on antibiotics and IV fluids.  Surgery consulted to assess for emergent laparotomy. On 11/24, patient underwent exploratory laparotomy, descending and sigmoid colon resection, transverse colostomy by Dr. [name].Surgical pathology showed transmural inflammation consistent with IBD. His postop course was complicated by almost 2 weeks of postop ileus which gradually resolved.  In the 2 weeks duration, he had significant fluid and electrolyte imbalance, hypoxia, fever, encephalopathy and physical deconditioning.  He received TPN, discontinued on 02/23/2022,  as he is tolerating a solid diet.. Pt. Also with ABL post op, received 1 unit PRBCS  12/9.  Pt. Was admitted to CIR 12/12 and made good progress with therapies. Transferred off due to rectal bleeding concerning for refurrent GI bleed  12/19. Pt. Seen again  by PT/OT who recommended CIR to assist return to PLOF.     Patient's medical record from [facility] has been reviewed by the rehabilitation admission coordinator and physician.  Past Medical History  Past Medical History:  Diagnosis Date   Adult ADHD    Allergic rhinitis    Anxiety    claustrophobic   Bronchopneumonia 11/03/2013   Colon polyps 2012 and 2013   All hyperplastic except one adenomatous w/out high grade dysplasia (recall 2023 per Dr. Deatra Ina)   COVID-19 virus infection 09/29/2020  Elevated transaminase level 2017   Viral hep screening neg.  Suspect fatty liver.   Hyperlipidemia    Hypertension    Inflammatory bowel disease    01/2022   Large bowel perforation ([place]) 02/2022   delayed, s/p colonoscopy w/biopsies (new dx crohn's/inflammatory BD)   Obesity    OSA on CPAP    Severe.  CPAP 10 cm H2O.  Follows Dr. Ander Slade.   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 [name] to do surg   Pneumonia    Seborrheic dermatitis of scalp    and face: hydrocort 2% cream bid prn to face,  and fluocinonide 0.5% sol'n to scalp bid prn per Dr. Allyson Sabal (04/2014)   Secondary male hypogonadism 04/2021   Type 2 diabetes mellitus with microalbuminuria ([place]) DM dx-09/27/2015. Microalb 2020    Has the patient had major surgery during 100 days prior to admission? Yes  Family History   family history includes Allergies in his father; Cancer in his father; Lung cancer in his father.  Current Medications  Current Facility-Administered Medications:    acetaminophen (TYLENOL) tablet 650 mg, 650 mg, Oral, Q6H PRN, 650 mg at 03/06/22 1145 **OR** [DISCONTINUED] acetaminophen (TYLENOL) suppository 650 mg, 650 mg, Rectal, Q6H PRN, Opyd, Ilene Qua, MD   alum & mag hydroxide-simeth (MAALOX/MYLANTA) 200-200-20 MG/5ML suspension 30 mL, 30 mL, Oral, Q6H PRN, Opyd, Ilene Qua, MD   atorvastatin (LIPITOR) tablet 40 mg, 40 mg, Oral, Daily, Opyd, Ilene Qua, MD, 40 mg at 03/08/22 0845   cefadroxil (DURICEF) capsule 1,000 mg, 1,000 mg, Oral, BID, Cherene Altes, MD, 1,000 mg at 03/08/22 0846   Chlorhexidine Gluconate Cloth 2 % PADS 6 each, 6 each, Topical, Q0600, Geradine Girt, DO, 6 each at 03/07/22 7893   diphenhydrAMINE (BENADRYL) 12.5 MG/5ML elixir 12.5-25 mg, 12.5-25 mg, Oral, Q6H PRN, Opyd, Ilene Qua, MD, 25 mg at 03/07/22 2228   guaiFENesin (MUCINEX) 12 hr tablet 600 mg, 600 mg, Oral, BID PRN, Cherene Altes, MD   hydrocortisone cream 0.5 %, , Topical, BID PRN, Opyd, Ilene Qua, MD   hydrOXYzine (ATARAX) tablet 25 mg, 25 mg, Oral, TID PRN, Opyd, Ilene Qua, MD   insulin aspart (novoLOG) injection 0-15 Units, 0-15 Units, Subcutaneous, TID WC, Vann, Jessica U, DO, 8 Units at 03/08/22 0843   insulin aspart (novoLOG) injection 0-5 Units, 0-5 Units, Subcutaneous, QHS, Vann, Jessica U, DO, 3 Units at 03/07/22 2218   insulin aspart (novoLOG) injection 6 Units, 6 Units, Subcutaneous, TID WC, Cherene Altes, MD, 6 Units at 03/08/22 0844   insulin detemir (LEVEMIR) injection 14 Units, 14 Units,  Subcutaneous, QHS, Cherene Altes, MD, 14 Units at 03/07/22 2217   lip balm (CARMEX) ointment 1 Application, 1 Application, Topical, BID, Opyd, Ilene Qua, MD, 1 Application at 81/01/75 1025   magic mouthwash, 15 mL, Oral, QID PRN, Opyd, Ilene Qua, MD   menthol-cetylpyridinium (CEPACOL) lozenge 3 mg, 1 lozenge, Oral, PRN, Opyd, Ilene Qua, MD   metroNIDAZOLE (FLAGYL) tablet 500 mg, 500 mg, Oral, Q12H, Joette Catching T, MD, 500 mg at 03/08/22 0845   naphazoline-glycerin (CLEAR EYES REDNESS) ophth solution 1-2 drop, 1-2 drop, Both Eyes, QID PRN, Opyd, Timothy S, MD   ondansetron (ZOFRAN) injection 4 mg, 4 mg, Intravenous, Q6H PRN, Opyd, Ilene Qua, MD   Oral care mouth rinse, 15 mL, Mouth Rinse, PRN, Opyd, Ilene Qua, MD   pantoprazole (PROTONIX) EC tablet 40 mg, 40 mg, Oral, Daily, Opyd, Ilene Qua, MD, 40 mg at 03/08/22 [id number]  phenol (CHLORASEPTIC) mouth spray 2 spray, 2 spray, Mouth/Throat, PRN, Opyd, Ilene Qua, MD   predniSONE (DELTASONE) tablet 40 mg, 40 mg, Oral, Q breakfast, Cirigliano, Vito V, DO, 40 mg at 03/08/22 0844   prochlorperazine (COMPAZINE) tablet 5-10 mg, 5-10 mg, Oral, Q6H PRN **OR** prochlorperazine (COMPAZINE) injection 5-10 mg, 5-10 mg, Intravenous, Q6H PRN **OR** prochlorperazine (COMPAZINE) suppository 12.5 mg, 12.5 mg, Rectal, Q6H PRN, Opyd, Ilene Qua, MD   Short Chain Fatty Acid enema, 1 enema, Rectal, QHS, Pierce, Dwayne A, RPH   simethicone (MYLICON) 40 SW/1.0XN suspension 80 mg, 80 mg, Oral, QID PRN, Opyd, Ilene Qua, MD   sodium chloride (OCEAN) 0.65 % nasal spray 1-2 spray, 1-2 spray, Each Nare, PRN, Opyd, Ilene Qua, MD   sodium chloride flush (NS) 0.9 % injection 10-40 mL, 10-40 mL, Intracatheter, Q12H, Opyd, Timothy S, MD, 10 mL at 03/07/22 2219   sodium chloride flush (NS) 0.9 % injection 10-40 mL, 10-40 mL, Intracatheter, PRN, Opyd, Ilene Qua, MD, 10 mL at 03/06/22 2330   sodium chloride flush (NS) 0.9 % injection 3 mL, 3 mL, Intravenous, Q12H, Opyd, Timothy S, MD,  3 mL at 03/07/22 2219   thiamine (VITAMIN B1) tablet 100 mg, 100 mg, Oral, Daily, Opyd, Timothy S, MD, 100 mg at 03/08/22 0845   traZODone (DESYREL) tablet 25-50 mg, 25-50 mg, Oral, QHS PRN, Opyd, Ilene Qua, MD   zinc sulfate capsule 220 mg, 220 mg, Oral, Daily, Opyd, Ilene Qua, MD, 220 mg at 03/08/22 0846  Patients Current Diet:  Diet Order             Diet regular Room service appropriate? Yes; Fluid consistency: Thin  Diet effective now                   Precautions / Restrictions Precautions Precautions: None Precaution Comments: L colostomy, monitor O2/HR Restrictions Weight Bearing Restrictions: No   Has the patient had 2 or more falls or a fall with injury in the past year? Yes  Prior Activity Level Community (5-7x/wk): pt. active in the community PTA  Prior Functional Level Self Care: Did the patient need help bathing, dressing, using the toilet or eating? Independent  Indoor Mobility: Did the patient need assistance with walking from room to room (with or without device)? Independent  Stairs: Did the patient need assistance with internal or external stairs (with or without device)? Independent  Functional Cognition: Did the patient need help planning regular tasks such as shopping or remembering to take medications? Independent  Patient Information Are you of Hispanic, Latino/a,or Spanish origin?: A. No, not of Hispanic, Latino/a, or Spanish origin What is your race?: A. White Do you need or want an interpreter to communicate with a doctor or health care staff?: 0. No  Patient's Response To:  Health Literacy and Transportation Is the patient able to respond to health literacy and transportation needs?: Yes Health Literacy - How often do you need to have someone help you when you read instructions, pamphlets, or other written material from your doctor or pharmacy?: Never In the past 12 months, has lack of transportation kept you from medical appointments or  from getting medications?: No In the past 12 months, has lack of transportation kept you from meetings, work, or from getting things needed for daily living?: No  Home Assistive Devices / Equipment Home Equipment: None  Prior Device Use: Indicate devices/aids used by the patient prior to current illness, exacerbation or injury? Walker  Current Functional Level Cognition  Overall  Cognitive Status: Within Functional Limits for tasks assessed Orientation Level: Oriented X4 General Comments: Does not like alot of people in and out of his room and one time; it's too much at once.    Extremity Assessment (includes Sensation/Coordination)  Upper Extremity Assessment: Defer to OT evaluation  Lower Extremity Assessment: Generalized weakness RLE Sensation: history of peripheral neuropathy LLE Sensation: history of peripheral neuropathy    ADLs  Overall ADL's : Needs assistance/impaired Grooming: Oral care, Wash/dry face, Set up, Bed level Upper Body Bathing: Maximal assistance, Sitting Upper Body Bathing Details (indicate cue type and reason): Due to multiple lines and leads. Lower Body Bathing: Total assistance, Bed level Upper Body Dressing : Maximal assistance, Sitting Lower Body Dressing: Bed level, Total assistance Toilet Transfer:  (NT) Toileting- Clothing Manipulation and Hygiene: Total assistance, Bed level    Mobility  Overal bed mobility: Needs Assistance Bed Mobility: Supine to Sit, Sit to Supine Rolling: Max assist Supine to sit: Mod assist, +2 for physical assistance Sit to supine: Mod assist, +2 for physical assistance General bed mobility comments: Pt requires 2 person moderate assistance to go from supine<>sitting with assistance with bil LE and trunk with HOB elevated and heavy use of bed rails.    Transfers  Overall transfer level: Needs assistance Equipment used:  Charlaine Dalton) Transfers: Sit to/from Stand Sit to Stand: Mod assist, +2 physical assistance Transfer via  Lift Equipment: Stedy General transfer comment: Pt stood at 2 person Mod A with EOB elevated to Stedy standing for ~5 min or greater while resting on steady seat for linen change.    Ambulation / Gait / Stairs / Wheelchair Mobility  Ambulation/Gait General Gait Details: Due to current functional status unable to attempt    Posture / Balance Dynamic Sitting Balance Sitting balance - Comments: initially pt requires physical assistance to steady himself but was able to progress to no UE support sitting EOB Balance Overall balance assessment: Needs assistance Sitting-balance support: No upper extremity supported, Feet supported Sitting balance-Leahy Scale: Fair Sitting balance - Comments: initially pt requires physical assistance to steady himself but was able to progress to no UE support sitting EOB Standing balance support: During functional activity, Reliant on assistive device for balance, Bilateral upper extremity supported Standing balance-Leahy Scale: Poor Standing balance comment: Utilized stedy to stand at [facility]    Special needs/care consideration Special service needs none   Previous Home Environment (from acute therapy documentation) Living Arrangements: Spouse/significant other  Lives With: Spouse Available Help at Discharge: Family Type of Home: House Home Layout: Two level Alternate Level Stairs-Rails: Left Alternate Level Stairs-Number of Steps: 15 down to his  office Home Access: Stairs to enter Entrance Stairs-Rails: Left Entrance Stairs-Number of Steps: 5 Bathroom Shower/Tub: Multimedia programmer: Standard Bathroom Accessibility: Yes How Accessible: Accessible via walker [place]: No  Discharge Living Setting Plans for Discharge Living Setting: Patient's home Type of Home at Discharge: House Discharge Home Layout: Two level, Able to live on main level with bedroom/bathroom Alternate Level Stairs-Rails: Can reach both Discharge Home Access:  Stairs to enter Entrance Stairs-Rails: Can reach both Entrance Stairs-Number of Steps: 5 Discharge Bathroom Shower/Tub: Tub/shower unit Discharge Bathroom Toilet: Standard Discharge Bathroom Accessibility: No Does the patient have any problems obtaining your medications?: No  Social/Family/Support Systems Patient Roles: Spouse Contact Information: [phone] Anticipated Caregiver: wife Anticipated Caregiver's Contact Information: rigby leonhardt Ability/Limitations of Caregiver: can [facility] Caregiver Availability: 24/7 Discharge Plan Discussed with Primary Caregiver: Yes Is Caregiver In Agreement with Plan?: Yes  Goals Patient/Family Goal for Rehab: PT/OT Supervision level Expected length of stay: 10-12 days  Decrease burden of Care through IP rehab admission: none   Possible need for SNF placement upon discharge:not anticipated  Patient Condition: I have reviewed medical records from [facility] , spoken with CM, and patient and spouse. I met with patient at the bedside for inpatient rehabilitation assessment.  Patient will benefit from ongoing PT and OT, can actively participate in 3 hours of therapy a day 5 days of the week, and can make measurable gains during the admission.  Patient will also benefit from the coordinated team approach during an Inpatient Acute Rehabilitation admission.  The patient will receive intensive therapy as well as Rehabilitation physician, nursing, social worker, and care management interventions.  Due to safety, skin/wound care, disease management, medication administration, pain management, and patient education the patient requires 24 hour a day rehabilitation nursing.  The patient is currently mod A+2 with mobility and basic ADLs.  Discharge setting and therapy post discharge at home with home health is anticipated.  Patient has agreed to participate in the Acute Inpatient Rehabilitation Program and will admit today.  Preadmission Screen  Completed By:  Genella Mech, 03/08/2022 10:47 AM ______________________________________________________________________   Discussed status with Dr. Ranell Patrick  on 03/08/22 at 63 and received approval for admission today.  Admission Coordinator:  Genella Mech, CCC-SLP, time 1045/Date 03/08/22   Assessment/Plan: Diagnosis: Debility Does the need for close, 24 hr/day Medical supervision in concert with the patient's rehab needs make it unreasonable for this patient to be served in a less intensive setting? Yes Co-Morbidities requiring supervision/potential complications: obesity, diarrhea, inflammation throughout the colon, perforated bowel, IBD Due to bladder management, bowel management, safety, skin/wound care, disease management, medication administration, pain management, and patient education, does the patient require 24 hr/day rehab nursing? Yes Does the patient require coordinated care of a physician, rehab nurse, PT, OT, and SLP to address physical and functional deficits in the context of the above medical diagnosis(es)? Yes Addressing deficits in the following areas: balance, endurance, locomotion, strength, transferring, bowel/bladder control, bathing, dressing, feeding, grooming, toileting, and psychosocial support Can the patient actively participate in an intensive therapy program of at least 3 hrs of therapy 5 days a week? Yes The potential for patient to make measurable gains while on inpatient rehab is excellent Anticipated functional outcomes upon discharge from inpatient rehab: supervision PT, supervision OT, independent and supervision SLP Estimated rehab length of stay to reach the above functional goals is: 10-14 days Anticipated discharge destination: Home 10. Overall Rehab/Functional Prognosis: excellent   MD Signature: Leeroy Cha, MD

## 2022-03-05 NOTE — Progress Notes (Signed)
Inpatient Rehab Admissions Coordinator:    CIR following for return to CIR once medical issues are managed. Will need to work with PT/OT in order to get insurance to approve CIR again. I placed orders for therapy evals.   Clemens Catholic, [place], [place] Admissions Coordinator  [phone] ([place]) [phone] (office)

## 2022-03-05 NOTE — Progress Notes (Signed)
  Echocardiogram 2D Echocardiogram has been performed.  Richard [name] 03/05/2022, 9:48 AM

## 2022-03-05 NOTE — Progress Notes (Addendum)
Attending physician's note   I have taken a history, reviewed the chart, and examined the patient. I performed a substantive portion of this encounter, including complete performance of at least one of the key components, in conjunction with the APP. I agree with the APP's note, impression, and recommendations with my edits.   I spoke with the patient and his wife at bedside at length today.  We discussed the diagnosis of Crohn's Disease, to include pathophysiology, natural course of disease, and treatment plan.  We discussed results of the flexible sigmoidoscopy yesterday which demonstrates deeply cratered ulcers in the distal rectum.  While these overall appeared better than they did on flexible sigmoidoscopy last week, but still a source of recurrent bleed, and was treated with PuraSTAT gel.  Discussed with Colorectal Surgery team as well, and we are both suspicious that this could represent diversion colitis, particularly since these deeply cratered ulcers were not present on the index colonoscopy in November in this location (he certainly had deeply cratered ulcers throughout the remainder of the colon).  I discussed with inpatient Pharmacy and should be able to initiate scfa in about 48 hours.  We had an in-depth conversation regarding long-term medical management of Crohn's disease.  Drug of choice in severe, penetrating, advanced phenotypic Crohn's would be Remicade.  Had been reluctant to start this just yet due to infection issues, but repeat CT last night demonstrates resolution of LLQ abscess.  Last albumin from 12/17 was 2.3, and ideally needs to be >2 with Remicade as albumin is a carrier molecule for this agent. Can hopefully see improvement as he can advance oral nutrition.  Will plan for the following: - Will initiate trial of short-chain fatty acid enemas.  Will keep volume <50 cc due to [facility] pouch - IR to evaluate drain and CT.  Any utility in drain interrogation? - TTE completed earlier today.  Will follow-up on results to ensure no contraindication to initiating Remicade - Continue advancing diet as tolerated - After discussion with Surgery team, no plan for repeat colonoscopy via ostomy to avoid torque/tension on stoma.  Based on serial CT and repeat flex sig yesterday, I do believe his Crohn's is responding to IV steroids - Continue Rocephin/Flagyl - As long as there are no barriers with the above evaluations, plan to initiate Remicade in the next 24-48 hours - Will need hepatitis B vaccine series - With a long conversation regarding the risks, benefits, alternatives of Remicade and biologic therapy in general, and he would like to proceed once able. - He is an intermediate metabolizer for TPMT, so no plan to initiate immunomodulators therapy.  Could consider MTX in the future for dual immunosuppressive therapy depending on response to Remicade -Sincerely appreciate the coordinated efforts by the primary service and multiple consulting services - Inpatient GI service will continue to follow  Gerrit Heck, DO, FACG [phone] office                Daily Rounding Note  03/05/2022, 9:11 AM  LOS: 0 days   SUBJECTIVE:   Chief complaint: rectal bleeding.  Abdominal abscess.       3 episodes bleeding since the sigmoidoscopy yesterday afternooon.  Latest episode  early this AM was a smaller volume of blood. Pt feeling ok Diet npo w transfer to inpt side so wondering if he can resume solid food.  Attending MD has now ordered CM diet Echo just completed now  OBJECTIVE:  Vital signs in last 24 hours:    Temp:  [97.1 F (36.2 C)-99.3 F (37.4 C)] 98.3 F (36.8 C) (12/19 0735) Pulse Rate:  [85-110] 110 (12/19 0735) Resp:  [17-25] 17 (12/19 0735) BP: (114-129)/(71-84) 118/77 (12/19 0735) SpO2:  [92 %-99 %] 97 % (12/19 0735)   There were no vitals filed for this visit. General: comfortable,  alert.  No distress.     Heart: RRR Chest: no dyspnea or cough Abdomen: soft, tender especially L mid/upper abdomen.  Yellow/purulent scant colume drainage in abscess drain.  Large bandage covering abdomen.  Ostomy w liquid, dark cholcolate colored stool    Extremities: no CCE Neuro/Psych:  oriented x 3.  No tremors or deficits.    Intake/Output from previous day: No intake/output data recorded.  Intake/Output this shift: No intake/output data recorded.  Lab Results: Recent Labs    03/03/22 1000 03/04/22 0425 03/04/22 1433 03/05/22 0012  WBC 13.4* 10.4  --  10.7*  HGB 10.4* 9.4* 10.9* 8.9*  HCT 32.7* 29.2* 32.0* 27.3*  PLT 359 325  --  328   BMET Recent Labs    03/03/22 1842 03/04/22 0425 03/04/22 1433  NA 137 136 138  K 4.2 3.9 4.0  CL 101 98 98  CO2 28 30  --   GLUCOSE 358* 249* 307*  BUN _0 CREATININE 0.57* 0.45* 0.40*  CALCIUM 8.5* 8.7*  --    LFT Recent Labs    03/03/22 1842 03/04/22 0425  PROT 5.8* 5.6*  ALBUMIN 2.3* 2.2*  AST 47* 28  ALT 29 24  ALKPHOS 153* 146*  BILITOT 0.5 0.6   PT/INR No results for input(s): "LABPROT", "INR" in the last 72 hours. Hepatitis Panel No results for input(s): "HEPBSAG", "HCVAB", "HEPAIGM", "HEPBIGM" in the last 72 hours.  Studies/Results: CT ABDOMEN PELVIS W CONTRAST  Result Date: 03/04/2022 CLINICAL DATA:  Left lower quadrant abscess status post IR drainage. Crohn's disease. EXAM: CT ABDOMEN AND PELVIS WITH CONTRAST TECHNIQUE: Multidetector CT imaging of the abdomen and pelvis was performed using the standard protocol following bolus administration of intravenous contrast. RADIATION DOSE REDUCTION: This exam was performed according to the departmental dose-optimization program which includes automated exposure control, adjustment of the mA and/or kV according to patient size and/or use of iterative reconstruction technique. CONTRAST:  66m OMNIPAQUE IOHEXOL 350 MG/ML SOLN COMPARISON:  CTA abdomen and pelvis  02/25/2022 FINDINGS: Lower chest: There are stable small bilateral pleural effusions with compressive atelectasis of the bilateral lower lobes. Hepatobiliary: No focal liver abnormality is seen. No gallstones, gallbladder wall thickening, or biliary dilatation. Pancreas: Unremarkable. No pancreatic ductal dilatation or surrounding inflammatory changes. Spleen: Normal in size without focal abnormality. Adrenals/Urinary Tract: Adrenal glands are unremarkable. Kidneys are normal, without renal calculi, focal lesion, or hydronephrosis. Bladder is unremarkable. Stomach/Bowel: Left lower quadrant colostomy again noted. Rectal stump is distended with stool similar to the prior study. There is no evidence for bowel obstruction, pneumatosis or free air. There are some distended small bowel loops without transition point in the left abdomen which may be related to ileus. Stomach is within normal limits. Vascular/Lymphatic: Aortic atherosclerosis. No enlarged abdominal or pelvic lymph nodes. Reproductive: Prostate is unremarkable. Other: There is small volume ascites which has mildly decreased. Mesenteric edema in the left abdomen has decreased. There is a new percutaneous drainage catheter ending in left lower quadrant. Left lower quadrant fluid collection has resolved in the interval. No new enhancing fluid collections are seen. Presacral edema is stable. Body  wall edema is stable. Musculoskeletal: Degenerative changes affect the spine IMPRESSION: 1. Interval placement of percutaneous drainage catheter in the left lower quadrant. Left lower quadrant fluid collection has resolved in the interval. No new fluid collection identified. 2. Small volume ascites has mildly decreased. 3. Stable small bilateral pleural effusions with compressive atelectasis of the bilateral lower lobes. 4. Mildly distended small bowel loops without transition point may be related to ileus. 5. Stable presacral and body wall edema. Aortic  Atherosclerosis (ICD10-I70.0). Electronically Signed   By: Ronney Asters M.D.   On: 03/04/2022 22:24   DG Chest 2 View  Result Date: 03/03/2022 CLINICAL DATA:  200808 Hypoxia 200808 [id number] Abnormal CXR [id number] EXAM: PORTABLE CHEST two views COMPARISON:  03/02/2022 FINDINGS: Cardiac silhouette is prominent. There is pulmonary interstitial prominence with vascular congestion. No focal consolidation. No pneumothorax identified. Large bilateral pleural effusions moderate layering bilateral pleural effusions. Right-sided PICC tip overlies mid SVC. IMPRESSION: Findings suggest CHF. Electronically Signed   By: Sammie Bench M.D.   On: 03/03/2022 12:59    Scheduled Meds:  atorvastatin  40 mg Oral Daily   Chlorhexidine Gluconate Cloth  6 each Topical BID   guaiFENesin  600 mg Oral BID   insulin aspart  0-9 Units Subcutaneous Q4H   lip balm  1 Application Topical BID   methylPREDNISolone (SOLU-MEDROL) injection  40 mg Intravenous Daily   pantoprazole  40 mg Oral Daily   [START ON 03/07/2022] Short Chain Fatty Acid Enema  1 enema Rectal Daily   sodium chloride flush  10-40 mL Intracatheter Q12H   sodium chloride flush  3 mL Intravenous Q12H   thiamine  100 mg Oral Daily   zinc sulfate  220 mg Oral Daily   Continuous Infusions:  cefTRIAXone (ROCEPHIN)  IV     metroNIDAZOLE     PRN Meds:.acetaminophen **OR** acetaminophen, alum & mag hydroxide-simeth, diphenhydrAMINE, guaiFENesin-dextromethorphan, hydrocortisone cream, hydrOXYzine, magic mouthwash, menthol-cetylpyridinium, naphazoline-glycerin, ondansetron (ZOFRAN) IV, mouth rinse, phenol, prochlorperazine **OR** prochlorperazine **OR** prochlorperazine, simethicone, sodium chloride, sodium chloride flush, traZODone   ASSESMENT:     New dx pan colitis, Crohn's dz on colonoscopy 02/04/22.  Solumedrol in place.      Bleeding from rectal ulcers.      Perforated colon.  S/p 02/07/22 descending/sigmoid colectomy, transverse colosotomy 12/11  sigmoidoscopy: deep rectal ulcers w bleeding stigmata. 12/18 Flex sig.  Rectal ulcers w blood present in rectal vault and stigmata of recent bleeding. Oozing of blood from ulcer w manipulation. Mucosa at rectosigmoid, distal sigmoid looked normal, improved from Nov colonoscopy.  Purastat hemostatic gel applied  03/04/22 CTAP: w contrast: LLQ fluid collection resolved with catheter remaining in place.  Mild decrease in small volume ascites.  Stable, small bilateral pleural effusions with compressive atelectasis in the lower lobes.  Mild distention and small bowel loops without transition point, query ileus.  Stable presacral and body wall edema Enemas of short chain fatty acids to start 12/21.    02/26/22 IR drainage large LLQ abscess, grew E coli, bacteroides.  Flagyl, Rocephin day 5.    Blood loss anemia.  Hgb 10.9.. 8.9 (midnight) over ten hours.      Hypoxia.  Bil pleural effusions.  Echo completed today, awaiting report.        IDDM.  Sugars elevated in setting of steroids.      Elevated Alk phos.        PLAN     Resume CM diet.  Monitor Hg/hcts.  Transfuse if indicated.  Management of drain per IR    Measure drain output closely.  Not able to access drain output in last few days.      Azucena Freed  03/05/2022, 9:11 AM Phone [phone]

## 2022-03-05 NOTE — Inpatient Diabetes Management (Signed)
Inpatient Diabetes Program Recommendations  AACE/ADA: New Consensus Statement on Inpatient Glycemic Control (2015)  Target Ranges:  Prepandial:   less than 140 mg/dL      Peak postprandial:   less than 180 mg/dL (1-2 hours)      Critically ill patients:  140 - 180 mg/dL   Lab Results  Component Value Date   GLUCAP 165 (H) 03/05/2022   HGBA1C 6.1 (H) 02/28/2022    Review of Glycemic Control  Latest Reference Range & Units 03/04/22 06:00 03/04/22 11:23 03/04/22 16:40 03/04/22 20:48 03/05/22 06:12 03/05/22 07:33  Glucose-Capillary 70 - 99 mg/dL 219 (H) 178 (H) 288 (H) 282 (H) 186 (H) 165 (H)  (H): Data is abnormally high  Diabetes history: DM2  Outpatient Diabetes medications:  Jardiance 10 mg QD-Not taking Novolog 0-15 units TID and 0-5 units QHS Metformin 500 mg BID  Current orders for Inpatient glycemic control:  Novolog 0-9 units Q4H  Solumedrol40 mg QD  Inpatient Diabetes Program Recommendations:    He is currently NPO.  Once he has a diet ordered and is eating, please consider:  Novolog 3 units tid with meals.    Will continue to follow while inpatient.  Thank you, Reche Dixon, MSN, [place] [facility] Coordinator Inpatient Diabetes Program [phone] (team pager from 8a-5p)

## 2022-03-06 ENCOUNTER — Inpatient Hospital Stay (HOSPITAL_COMMUNITY): Payer: 59

## 2022-03-06 DIAGNOSIS — K625 Hemorrhage of anus and rectum: Secondary | ICD-10-CM | POA: Diagnosis not present

## 2022-03-06 DIAGNOSIS — K626 Ulcer of anus and rectum: Secondary | ICD-10-CM | POA: Diagnosis not present

## 2022-03-06 DIAGNOSIS — K50114 Crohn's disease of large intestine with abscess: Secondary | ICD-10-CM | POA: Diagnosis not present

## 2022-03-06 DIAGNOSIS — J9621 Acute and chronic respiratory failure with hypoxia: Secondary | ICD-10-CM

## 2022-03-06 HISTORY — PX: IR SINUS/FIST TUBE CHK-NON GI: IMG673

## 2022-03-06 LAB — BASIC METABOLIC PANEL
Anion gap: 7 (ref 5–15)
BUN: 14 mg/dL (ref 8–23)
CO2: 29 mmol/L (ref 22–32)
Calcium: 8.3 mg/dL — ABNORMAL LOW (ref 8.9–10.3)
Chloride: 102 mmol/L (ref 98–111)
Creatinine, Ser: 0.37 mg/dL — ABNORMAL LOW (ref 0.61–1.24)
GFR, Estimated: >60 mL/min (ref >60)
Glucose, Bld: 214 mg/dL — ABNORMAL HIGH (ref 70–99)
Potassium: 3.8 mmol/L (ref 3.5–5.1)
Sodium: 138 mmol/L (ref 135–145)

## 2022-03-06 LAB — HEPATIC FUNCTION PANEL
ALT: 19 U/L (ref 0–44)
AST: 23 U/L (ref 15–41)
Albumin: 2.1 g/dL — ABNORMAL LOW (ref 3.5–5.0)
Alkaline Phosphatase: 99 U/L (ref 38–126)
Bilirubin, Direct: <0.1 mg/dL (ref 0.0–0.2)
Total Bilirubin: 0.4 mg/dL (ref 0.3–1.2)
Total Protein: 5.4 g/dL — ABNORMAL LOW (ref 6.5–8.1)

## 2022-03-06 LAB — CBC
HCT: 25.6 % — ABNORMAL LOW (ref 39.0–52.0)
Hemoglobin: 8.2 g/dL — ABNORMAL LOW (ref 13.0–17.0)
MCH: 31.8 pg (ref 26.0–34.0)
MCHC: 32 g/dL (ref 30.0–36.0)
MCV: 99.2 fL (ref 80.0–100.0)
Platelets: 332 10*3/uL (ref 150–400)
RBC: 2.58 MIL/uL — ABNORMAL LOW (ref 4.22–5.81)
RDW: 16.5 % — ABNORMAL HIGH (ref 11.5–15.5)
WBC: 10.5 10*3/uL (ref 4.0–10.5)
nRBC: 0 % (ref 0.0–0.2)

## 2022-03-06 LAB — GLUCOSE, CAPILLARY
Glucose-Capillary: 203 mg/dL — ABNORMAL HIGH (ref 70–99)
Glucose-Capillary: 256 mg/dL — ABNORMAL HIGH (ref 70–99)
Glucose-Capillary: 265 mg/dL — ABNORMAL HIGH (ref 70–99)
Glucose-Capillary: 307 mg/dL — ABNORMAL HIGH (ref 70–99)
Glucose-Capillary: 345 mg/dL — ABNORMAL HIGH (ref 70–99)

## 2022-03-06 LAB — MAGNESIUM: Magnesium: 1.3 mg/dL — ABNORMAL LOW (ref 1.7–2.4)

## 2022-03-06 MED ORDER — INSULIN ASPART 100 UNIT/ML IJ SOLN
6.0000 [IU] | Freq: Three times a day (TID) | INTRAMUSCULAR | Status: DC
  Administered 2022-03-06 – 2022-03-08 (×6): 6 [IU] via SUBCUTANEOUS

## 2022-03-06 MED ORDER — INSULIN DETEMIR 100 UNIT/ML ~~LOC~~ SOLN
14.0000 [IU] | Freq: Every day | SUBCUTANEOUS | Status: DC
  Administered 2022-03-06 – 2022-03-07 (×2): 14 [IU] via SUBCUTANEOUS
  Filled 2022-03-06 (×3): qty 0.14

## 2022-03-06 MED ORDER — GUAIFENESIN ER 600 MG PO TB12: 600.0000 mg | ORAL_TABLET | Freq: Two times a day (BID) | ORAL | Status: DC | PRN

## 2022-03-06 MED ORDER — MAGNESIUM SULFATE 4 GM/100ML IV SOLN
4.0000 g | Freq: Once | INTRAVENOUS | Status: AC
  Administered 2022-03-06: 4 g via INTRAVENOUS
  Filled 2022-03-06: qty 100

## 2022-03-06 MED ORDER — FUROSEMIDE 10 MG/ML IJ SOLN
40.0000 mg | Freq: Once | INTRAMUSCULAR | Status: AC
  Administered 2022-03-06: 40 mg via INTRAVENOUS
  Filled 2022-03-06: qty 4

## 2022-03-06 MED ORDER — IOHEXOL 300 MG/ML  SOLN
50.0000 mL | Freq: Once | INTRAMUSCULAR | Status: AC | PRN
  Administered 2022-03-06: 10 mL

## 2022-03-06 MED ORDER — NONFORMULARY OR COMPOUNDED ITEM
1.0000 | Freq: Every day | Status: DC
  Administered 2022-03-06 – 2022-03-08 (×3): 1 via RECTAL
  Filled 2022-03-06 (×2): qty 1

## 2022-03-06 NOTE — Progress Notes (Addendum)
Rounding Note    Patient Name: Richard [name] Date of Encounter: 03/06/2022  [place] Cardiologist: Werner Lean, MD   Subjective   Re-admitted to [facility] for GI bleeding. Discussed echo results  Inpatient Medications    Scheduled Meds:  atorvastatin  40 mg Oral Daily   Chlorhexidine Gluconate Cloth  6 each Topical Q0600   guaiFENesin  600 mg Oral BID   insulin aspart  0-15 Units Subcutaneous TID WC   insulin aspart  0-5 Units Subcutaneous QHS   insulin aspart  3 Units Subcutaneous TID WC   lip balm  1 Application Topical BID   methylPREDNISolone (SOLU-MEDROL) injection  40 mg Intravenous Daily   pantoprazole  40 mg Oral Daily   [START ON 03/07/2022] Short Chain Fatty Acid Enema  1 enema Rectal Daily   sodium chloride flush  10-40 mL Intracatheter Q12H   sodium chloride flush  3 mL Intravenous Q12H   thiamine  100 mg Oral Daily   zinc sulfate  220 mg Oral Daily   Continuous Infusions:  cefTRIAXone (ROCEPHIN)  IV 2 g (03/05/22 1712)   inFLIXimab (REMICADE) 5 mg/kg = 600 mg in sodium chloride 0.9 % 500 mL infusion     metroNIDAZOLE 500 mg (03/06/22 0817)   PRN Meds: acetaminophen **OR** acetaminophen, alum & mag hydroxide-simeth, diphenhydrAMINE, guaiFENesin-dextromethorphan, hydrocortisone cream, hydrOXYzine, magic mouthwash, menthol-cetylpyridinium, naphazoline-glycerin, ondansetron (ZOFRAN) IV, mouth rinse, phenol, prochlorperazine **OR** prochlorperazine **OR** prochlorperazine, simethicone, sodium chloride, sodium chloride flush, traZODone   Vital Signs    Vitals:   03/05/22 0649  BP: (P) 120/81  Pulse: (!) (P) 110  Resp: (P) 19  Temp: (P) 98.6 F (37 C)  TempSrc: (P) Oral  SpO2: (P) 97%    Intake/Output Summary (Last 24 hours) at 03/06/2022 0901 Last data filed at 03/06/2022 0700 Gross per 24 hour  Intake --  Output 470 ml  Net -470 ml      03/01/2022    2:32 PM 02/28/2022    4:11 AM 02/27/2022    4:47 AM  Last 3  Weights  Weight (lbs) 264 lb 5.3 oz 247 lb 5.7 oz 251 lb 15.8 oz  Weight (kg) 119.9 kg 112.2 kg 114.3 kg      Telemetry    Sinus - sinus tachycardia - Personally Reviewed  ECG    No new tracings - will repeat EKG - Personally Reviewed  Physical Exam   GEN: No acute distress.   Neck: No JVD but exam difficult Cardiac: regular rhythm, tachycardic rate Respiratory: respirations unlabored, diminished in bases GI: Soft, nontender, non-distended  MS: mild B LE edema with chronic skin changes, no pitting edema; No deformity. Neuro:  Nonfocal  Psych: Normal affect   Labs    High Sensitivity Troponin:   Recent Labs  Lab 02/07/22 2030 02/07/22 2255  TROPONINIHS 10 14     Chemistry Recent Labs  Lab 02/28/22 0410 03/01/22 0447 03/03/22 1842 03/04/22 0425 03/04/22 1433 03/06/22 0202  NA 140 142 137 136 138 138  K 4.2 4.0 4.2 3.9 4.0 3.8  CL 101 103 101 98 98 102  CO2 '30 30 28 30  '$ --  29  GLUCOSE 275* 252* 358* 249* 307* 214*  BUN '11 16 21 21 17 14  '$ CREATININE 0.37* 0.35* 0.57* 0.45* 0.40* 0.37*  CALCIUM 8.0* 8.0* 8.5* 8.7*  --  8.3*  MG 1.8 1.8  --   --   --  1.3*  PROT 6.2* 5.8* 5.8* 5.6*  --   --  ALBUMIN 2.7* 2.3* 2.3* 2.2*  --   --   AST 16 15 47* 28  --   --   ALT '10 10 29 24  '$ --   --   ALKPHOS 98 97 153* 146*  --   --   BILITOT 0.6 0.3 0.5 0.6  --   --   GFRNONAA >60 >60 >60 >60  --  >60  ANIONGAP '9 9 8 8  '$ --  7    Lipids No results for input(s): "CHOL", "TRIG", "HDL", "LABVLDL", "LDLCALC", "CHOLHDL" in the last 168 hours.  Hematology Recent Labs  Lab 03/05/22 0012 03/05/22 1350 03/06/22 0202  WBC 10.7* 17.6* 10.5  RBC 2.80* 3.12* 2.58*  HGB 8.9* 10.0* 8.2*  HCT 27.3* 30.4* 25.6*  MCV 97.5 97.4 99.2  MCH 31.8 32.1 31.8  MCHC 32.6 32.9 32.0  RDW 15.9* 16.5* 16.5*  PLT 328 374 332   Thyroid No results for input(s): "TSH", "FREET4" in the last 168 hours.  BNP Recent Labs  Lab 03/03/22 1842 03/04/22 0425  BNP 152.8* 105.7*    DDimer No  results for input(s): "DDIMER" in the last 168 hours.   Radiology    ECHOCARDIOGRAM COMPLETE  Result Date: 03/05/2022    ECHOCARDIOGRAM REPORT   Patient Name:   DONNE BALEY Date of Exam: 03/05/2022 Medical Rec #:  [id number]      Height:       69.0 in Accession #:    [id number]     Weight:       264.3 lb Date of Birth:  [birth year]      BSA:          2.326 m Patient Age:    66 years       BP:           118/77 mmHg Patient Gender: M              HR:           115 bpm. Exam Location:  Inpatient Procedure: 2D Echo, Cardiac Doppler and Color Doppler Indications:    CHF  History:        Patient has no prior history of Echocardiogram examinations.                 Risk Factors:Diabetes, Sleep Apnea, Dyslipidemia and                 Hypertension.  Sonographer:    Clayton Lefort RDCS (AE) Referring Phys: [id number] [facility]  Sonographer Comments: Limited patient mobility. IMPRESSIONS  1. Left ventricular ejection fraction, by estimation, is 60 to 65%. The left ventricle has normal function. The left ventricle has no regional wall motion abnormalities. Indeterminate diastolic filling due to E-A fusion.  2. Right ventricular systolic function is normal. The right ventricular size is normal. Tricuspid regurgitation signal is inadequate for assessing PA pressure.  3. The mitral valve is grossly normal. Trivial mitral valve regurgitation. No evidence of mitral stenosis.  4. The aortic valve is tricuspid. Aortic valve regurgitation is not visualized. No aortic stenosis is present.  5. The inferior vena cava is normal in size with greater than 50% respiratory variability, suggesting right atrial pressure of 3 mmHg. FINDINGS  Left Ventricle: Left ventricular ejection fraction, by estimation, is 60 to 65%. The left ventricle has normal function. The left ventricle has no regional wall motion abnormalities. The left ventricular internal cavity size was normal in size. There is  no left ventricular hypertrophy.  Indeterminate diastolic filling due to E-A fusion. Right Ventricle: The right ventricular size is normal. No increase in right ventricular wall thickness. Right ventricular systolic function is normal. Tricuspid regurgitation signal is inadequate for assessing PA pressure. Left Atrium: Left atrial size was normal in size. Right Atrium: Right atrial size was normal in size. Pericardium: Trivial pericardial effusion is present. Presence of epicardial fat layer. Mitral Valve: The mitral valve is grossly normal. Trivial mitral valve regurgitation. No evidence of mitral valve stenosis. Tricuspid Valve: The tricuspid valve is grossly normal. Tricuspid valve regurgitation is not demonstrated. No evidence of tricuspid stenosis. Aortic Valve: The aortic valve is tricuspid. Aortic valve regurgitation is not visualized. No aortic stenosis is present. Aortic valve mean gradient measures 4.0 mmHg. Aortic valve peak gradient measures 8.4 mmHg. Aortic valve area, by VTI measures 2.98 cm. Pulmonic Valve: The pulmonic valve was grossly normal. Pulmonic valve regurgitation is not visualized. No evidence of pulmonic stenosis. Aorta: The aortic root and ascending aorta are structurally normal, with no evidence of dilitation. Venous: The inferior vena cava is normal in size with greater than 50% respiratory variability, suggesting right atrial pressure of 3 mmHg. IAS/Shunts: The atrial septum is grossly normal.  LEFT VENTRICLE PLAX 2D LVIDd:         4.20 cm LVIDs:         2.60 cm LV PW:         1.30 cm LV IVS:        1.10 cm LVOT diam:     2.20 cm LV SV:         59 LV SV Index:   25 LVOT Area:     3.80 cm  RIGHT VENTRICLE RV Basal diam:  2.80 cm RV S prime:     19.00 cm/s TAPSE (M-mode): 2.0 cm LEFT ATRIUM             Index        RIGHT ATRIUM           Index LA diam:        2.70 cm 1.16 cm/m   RA Area:     13.60 cm LA Vol (A2C):   23.7 ml 10.19 ml/m  RA Volume:   31.10 ml  13.37 ml/m LA Vol (A4C):   25.2 ml 10.83 ml/m LA Biplane  Vol: 25.4 ml 10.92 ml/m  AORTIC VALVE AV Area (Vmax):    3.01 cm AV Area (Vmean):   3.03 cm AV Area (VTI):     2.98 cm AV Vmax:           145.00 cm/s AV Vmean:          94.300 cm/s AV VTI:            0.198 m AV Peak Grad:      8.4 mmHg AV Mean Grad:      4.0 mmHg LVOT Vmax:         115.00 cm/s LVOT Vmean:        75.200 cm/s LVOT VTI:          0.155 m LVOT/AV VTI ratio: 0.78  AORTA Ao Root diam: 3.80 cm Ao Asc diam:  3.90 cm  SHUNTS Systemic VTI:  0.16 m Systemic Diam: 2.20 cm Eleonore Chiquito MD Electronically signed by Eleonore Chiquito MD Signature Date/Time: 03/05/2022/9:58:22 AM    Final    CT ABDOMEN PELVIS W CONTRAST  Result Date: 03/04/2022 CLINICAL DATA:  Left lower quadrant abscess status post IR drainage. Crohn's disease.  EXAM: CT ABDOMEN AND PELVIS WITH CONTRAST TECHNIQUE: Multidetector CT imaging of the abdomen and pelvis was performed using the standard protocol following bolus administration of intravenous contrast. RADIATION DOSE REDUCTION: This exam was performed according to the departmental dose-optimization program which includes automated exposure control, adjustment of the mA and/or kV according to patient size and/or use of iterative reconstruction technique. CONTRAST:  81m OMNIPAQUE IOHEXOL 350 MG/ML SOLN COMPARISON:  CTA abdomen and pelvis 02/25/2022 FINDINGS: Lower chest: There are stable small bilateral pleural effusions with compressive atelectasis of the bilateral lower lobes. Hepatobiliary: No focal liver abnormality is seen. No gallstones, gallbladder wall thickening, or biliary dilatation. Pancreas: Unremarkable. No pancreatic ductal dilatation or surrounding inflammatory changes. Spleen: Normal in size without focal abnormality. Adrenals/Urinary Tract: Adrenal glands are unremarkable. Kidneys are normal, without renal calculi, focal lesion, or hydronephrosis. Bladder is unremarkable. Stomach/Bowel: Left lower quadrant colostomy again noted. Rectal stump is distended with stool  similar to the prior study. There is no evidence for bowel obstruction, pneumatosis or free air. There are some distended small bowel loops without transition point in the left abdomen which may be related to ileus. Stomach is within normal limits. Vascular/Lymphatic: Aortic atherosclerosis. No enlarged abdominal or pelvic lymph nodes. Reproductive: Prostate is unremarkable. Other: There is small volume ascites which has mildly decreased. Mesenteric edema in the left abdomen has decreased. There is a new percutaneous drainage catheter ending in left lower quadrant. Left lower quadrant fluid collection has resolved in the interval. No new enhancing fluid collections are seen. Presacral edema is stable. Body wall edema is stable. Musculoskeletal: Degenerative changes affect the spine IMPRESSION: 1. Interval placement of percutaneous drainage catheter in the left lower quadrant. Left lower quadrant fluid collection has resolved in the interval. No new fluid collection identified. 2. Small volume ascites has mildly decreased. 3. Stable small bilateral pleural effusions with compressive atelectasis of the bilateral lower lobes. 4. Mildly distended small bowel loops without transition point may be related to ileus. 5. Stable presacral and body wall edema. Aortic Atherosclerosis (ICD10-I70.0). Electronically Signed   By: ARonney AstersM.D.   On: 03/04/2022 22:24    Cardiac Studies   Echo 03/05/22:  1. Left ventricular ejection fraction, by estimation, is 60 to 65%. The  left ventricle has normal function. The left ventricle has no regional  wall motion abnormalities. Indeterminate diastolic filling due to E-A  fusion.   2. Right ventricular systolic function is normal. The right ventricular  size is normal. Tricuspid regurgitation signal is inadequate for assessing  PA pressure.   3. The mitral valve is grossly normal. Trivial mitral valve  regurgitation. No evidence of mitral stenosis.   4. The aortic valve  is tricuspid. Aortic valve regurgitation is not  visualized. No aortic stenosis is present.   5. The inferior vena cava is normal in size with greater than 50%  respiratory variability, suggesting right atrial pressure of 3 mmHg.    Patient Profile     66y.o. male  with a history of GI issues with question of HF. Prolonged [facility] hospitalization with peritonitis requiring exlap. Now in CIR, cardiology consulted for new O2 requirement and edema.   Unfortunately, he started having rectal bleeding 03/03/22 prompting emergent flex sigmoid that was reassuring. He continued to pass large clots yesterday prompting re-admission.  Assessment & Plan    HFpEF Echo yesterday with LVEF 60-65%, no LVH, no significant valvular disease - could not rule out a component of diastolic dysfunction BNP 1536--> 106  Since re-admission, has not restarted lasix I will restart 40 mg IV lasix daily   Hypoxia CXR on 12/17 with pleural effusions Consider repeating CXR today, may need to consider thoracentesis    Hypomagnesemia Mg 1.3 I have ordered 4 g IV K 3.8   Narrow complex tachycardia Appears sinus Redness on inner arms and neck from allergic reaction to flagyl Also, low Mg as above He is asymptomatic Will replace Mg and monitor heart rate on tele Obtain formal 12 lead EKG, has not had one since 02/24/22 Low albumin Protein calorie malnutrition Obesity Orthostatic heart fate Echo with preserved EF and no valvular disease Edema may be difficult to control given albumin Albumin 2.1   Ex lap with colon resection and colostomy 02/07/22 LLQ abscess s/p drain per IR - CT abdomen last night with resolution of LLQ collection and decrease in small volume ascites       For questions or updates, please contact [facility] Please consult [email or web address] for contact info under        Signed, Ledora Bottcher, PA  03/06/2022, 9:01 AM    Personally seen and examined. Agree with APP  above with the following comments: Patient is doing well - need K and Mg repletion - Is on 3 L; no evidence of HFrEF, Normal lateral E' velocity is re-assuring. - EKG shows SR with rare PACs - Was attempted to titrate down O2 but patient was to start Remicaide today  - will need to build up albumin long term; will need K goal of 4; Mg goal of 2, and 40 IV lasix for support. - Patient and family express frustration of having so many providers involved in his care - will sign off at this time.  Have not arranged follow up at the request of the patient.  If new issues with diureses occur, we are happy to re-engage.  Rudean Haskell, MD [address], [id number] [place], [place] [id number] [phone]  12:28 PM

## 2022-03-06 NOTE — Progress Notes (Addendum)
Attending physician's note   I have taken a history, reviewed the chart, and examined the patient. I performed a substantive portion of this encounter, including complete performance of at least one of the key components, in conjunction with the APP. I agree with the APP's note, impression, and recommendations with my edits.   Received first dose of Remicade without issue.  No rectal bleeding overnight.  Drain interrogation study by IR earlier today confirmed decompressed cavity without fistula, and drainage catheter was subsequently removed.  Tolerating p.o., and hoping to modify/liberalize his diet a bit.  - Second dose of Remicade in 2 weeks - Continue Flagyl for now.  Tentative plan to continue prolonged, low-dose course for penetrating Crohn's Disease - Set to receive first dose of short-chain fatty acid enema today - Continue Solu-Medrol today, and should be able to transition to prednisone tomorrow with plan for taper now that Remicade has been started - Albumin 2.1 today.  May benefit from IV albumin infusion to facilitate Remicade efficacy - Ok to [employer].  Drinking Ensure for supplemental caloric intake  Gerrit Heck, DO, FACG [phone] office                Daily Rounding Note  03/06/2022, 11:35 AM  LOS: 1 day   SUBJECTIVE:   Chief complaint:   Crohns.  Bleeding PR   No discharge or bleeding from rectum.  Dark brown liquid to soft BM into ostomy.   Abscess drain removed this AM. No abd pain.  Tolerating diet but asking why he is now restricted to CM diet when had been on regular diet in previous weeks.    OBJECTIVE:         Vital signs in last 24 hours:    Temp:  [98.2 F (36.8 C)-99.1 F (37.3 C)] 98.2 F (36.8 C) (12/20 0924) Pulse Rate:  [100-120] 115 (12/20 0924) Resp:  [16-18] 17 (12/20 0924) BP: (106-133)/(67-75) 111/74 (12/20 0924) SpO2:  [96 %-98 %] 98 % (12/20 0924)   There were no  vitals filed for this visit. General: NAD   Heart: RRR Chest: clear wo labored reathing Abdomen: soft.  NT.  Obese wo distention.  BS activeDrain site and surgery incision site covered w bandages.  Dark brown sot to liquid stool in ostomy  Extremities: no CCE Neuro/Psych:  oriented x 3.    Intake/Output from previous day: 12/19 0701 - 12/20 0700 In: -  Out: 470 [Drains:20; Stool:450]  Intake/Output this shift: No intake/output data recorded.  Lab Results: Recent Labs    03/05/22 0012 03/05/22 1350 03/06/22 0202  WBC 10.7* 17.6* 10.5  HGB 8.9* 10.0* 8.2*  HCT 27.3* 30.4* 25.6*  PLT 328 374 332   BMET Recent Labs    03/03/22 1842 03/04/22 0425 03/04/22 1433 03/06/22 0202  NA 137 136 138 138  K 4.2 3.9 4.0 3.8  CL 101 98 98 102  CO2 28 30  --  29  GLUCOSE 358* 249* 307* 214*  BUN '21 21 17 14  '$ CREATININE 0.57* 0.45* 0.40* 0.37*  CALCIUM 8.5* 8.7*  --  8.3*   LFT Recent Labs    03/03/22 1842 03/04/22 0425 03/06/22 0202  PROT 5.8* 5.6* 5.4*  ALBUMIN 2.3* 2.2* 2.1*  AST 47* 28 23  ALT '29 24 19  '$ ALKPHOS 153* 146* 99  BILITOT 0.5 0.6 0.4  BILIDIR  --   --  <0.1  IBILI  --   --  NOT CALCULATED   PT/INR  No results for input(s): "LABPROT", "INR" in the last 72 hours. Hepatitis Panel No results for input(s): "HEPBSAG", "HCVAB", "HEPAIGM", "HEPBIGM" in the last 72 hours.  Studies/Results: IR Sinus/Fist Tube Chk-Non GI  Result Date: 03/06/2022 INDICATION: Drain check EXAM: Drain check and injection using fluoroscopy MEDICATIONS: Refer to EMR ANESTHESIA/SEDATION: None COMPLICATIONS: None immediate. PROCEDURE: Informed written consent was obtained from the patient after a thorough discussion of the procedural risks, benefits and alternatives. All questions were addressed. Maximal Sterile Barrier Technique was utilized including caps, mask, sterile gowns, sterile gloves, sterile drape, hand hygiene and skin antiseptic. A timeout was performed prior to the initiation  of the procedure. The patient was placed supine on the exam table. The left lower quadrant drainage catheter was accessed in a sterile technique. Injection of contrast material demonstrated a decompressed cavity. No communication with the adjacent bowel was identified. The decision was made to proceed with drain removal. Locking loop was released, and the drainage catheter was removed without issue. A clean dressing was placed. IMPRESSION: Left lower quadrant drainage catheter injection demonstrated a decompressed cavity without fistula. Drainage catheter successfully removed. Electronically Signed   By: Albin Felling M.D.   On: 03/06/2022 10:21   ECHOCARDIOGRAM COMPLETE  Result Date: 03/05/2022 IMPRESSIONS  1. Left ventricular ejection fraction, by estimation, is 60 to 65%. The left ventricle has normal function. The left ventricle has no regional wall motion abnormalities. Indeterminate diastolic filling due to E-A fusion.  2. Right ventricular systolic function is normal. The right ventricular size is normal. Tricuspid regurgitation signal is inadequate for assessing PA pressure.  3. The mitral valve is grossly normal. Trivial mitral valve regurgitation. No evidence of mitral stenosis.  4. The aortic valve is tricuspid. Aortic valve regurgitation is not visualized. No aortic stenosis is present.  5. The inferior vena cava is normal in size with greater than 50% respiratory variability, suggesting right atrial pressure of 3 mmHg. Electronically signed by Eleonore Chiquito MD Signature Date/Time: 03/05/2022/9:58:22 AM    Final    CT ABDOMEN PELVIS W CONTRAST  Result Date: 03/04/2022 CLINICAL DATA:  Left lower quadrant abscess status post IR drainage. Crohn's disease. EXAM: CT ABDOMEN AND PELVIS WITH CONTRAST TECHNIQUE: Multidetector CT imaging of the abdomen and pelvis was performed using the standard protocol following bolus administration of intravenous contrast. RADIATION DOSE REDUCTION: This exam was  performed according to the departmental dose-optimization program which includes automated exposure control, adjustment of the mA and/or kV according to patient size and/or use of iterative reconstruction technique. CONTRAST:  72m OMNIPAQUE IOHEXOL 350 MG/ML SOLN COMPARISON:  CTA abdomen and pelvis 02/25/2022 FINDINGS: Lower chest: There are stable small bilateral pleural effusions with compressive atelectasis of the bilateral lower lobes. Hepatobiliary: No focal liver abnormality is seen. No gallstones, gallbladder wall thickening, or biliary dilatation. Pancreas: Unremarkable. No pancreatic ductal dilatation or surrounding inflammatory changes. Spleen: Normal in size without focal abnormality. Adrenals/Urinary Tract: Adrenal glands are unremarkable. Kidneys are normal, without renal calculi, focal lesion, or hydronephrosis. Bladder is unremarkable. Stomach/Bowel: Left lower quadrant colostomy again noted. Rectal stump is distended with stool similar to the prior study. There is no evidence for bowel obstruction, pneumatosis or free air. There are some distended small bowel loops without transition point in the left abdomen which may be related to ileus. Stomach is within normal limits. Vascular/Lymphatic: Aortic atherosclerosis. No enlarged abdominal or pelvic lymph nodes. Reproductive: Prostate is unremarkable. Other: There is small volume ascites which has mildly decreased. Mesenteric edema in the left abdomen  has decreased. There is a new percutaneous drainage catheter ending in left lower quadrant. Left lower quadrant fluid collection has resolved in the interval. No new enhancing fluid collections are seen. Presacral edema is stable. Body wall edema is stable. Musculoskeletal: Degenerative changes affect the spine IMPRESSION: 1. Interval placement of percutaneous drainage catheter in the left lower quadrant. Left lower quadrant fluid collection has resolved in the interval. No new fluid collection  identified. 2. Small volume ascites has mildly decreased. 3. Stable small bilateral pleural effusions with compressive atelectasis of the bilateral lower lobes. 4. Mildly distended small bowel loops without transition point may be related to ileus. 5. Stable presacral and body wall edema. Aortic Atherosclerosis (ICD10-I70.0). Electronically Signed   By: Ronney Asters M.D.   On: 03/04/2022 22:24    Scheduled Meds: . atorvastatin  40 mg Oral Daily  . Chlorhexidine Gluconate Cloth  6 each Topical Q0600  . insulin aspart  0-15 Units Subcutaneous TID WC  . insulin aspart  0-5 Units Subcutaneous QHS  . insulin aspart  3 Units Subcutaneous TID WC  . lip balm  1 Application Topical BID  . methylPREDNISolone (SOLU-MEDROL) injection  40 mg Intravenous Daily  . pantoprazole  40 mg Oral Daily  . [START ON 03/07/2022] Short Chain Fatty Acid Enema  1 enema Rectal Daily  . sodium chloride flush  10-40 mL Intracatheter Q12H  . sodium chloride flush  3 mL Intravenous Q12H  . thiamine  100 mg Oral Daily  . zinc sulfate  220 mg Oral Daily   Continuous Infusions: . cefTRIAXone (ROCEPHIN)  IV 2 g (03/05/22 1712)  . inFLIXimab (REMICADE) 5 mg/kg = 600 mg in sodium chloride 0.9 % 500 mL infusion 600 mg (03/06/22 1150)  . magnesium sulfate bolus IVPB 4 g (03/06/22 1030)  . metroNIDAZOLE 500 mg (03/06/22 0817)   PRN Meds:.acetaminophen **OR** [DISCONTINUED] acetaminophen, alum & mag hydroxide-simeth, diphenhydrAMINE, guaiFENesin, hydrocortisone cream, hydrOXYzine, magic mouthwash, menthol-cetylpyridinium, naphazoline-glycerin, ondansetron (ZOFRAN) IV, mouth rinse, phenol, prochlorperazine **OR** prochlorperazine **OR** prochlorperazine, simethicone, sodium chloride, sodium chloride flush, traZODone   ASSESMENT:     Severe Crohn's dz.   New dx pan colitis, Crohn's dz on colonoscopy 02/04/22.  Solumedrol in place.      Bleeding from rectal ulcers.   12/11 sigmoidoscopy: deep rectal ulcers w bleeding  stigmata. 12/18 Flex sig.  Rectal ulcers w blood present, stigmata of recent bleeding. Oozing of blood from ulcer w manipulation. Purastat hemostatic gel applied Mucosa at rectosigmoid, distal sigmoid looked normal, improved from Nov colonoscopy.   Remicade infusion #1 today 12/20.  Intermediate TPMT metabolizer.  Enemas of short chain fatty acids (from specialty compounding pharmacy) to start 12/21.      Perforated colon.   S/p 02/07/22 descending/sigmoid colectomy, transverse colostomy 02/26/22 IR drainage large LLQ abscess, grew E coli, bacteroides. Flagyl since 12/13, Rocephin since 12/15 (note because of transfer from CIR current 2 days are not correct). 03/04/22 CTAP: w contrast: LLQ fluid collection resolved with catheter remaining in place.  Mild decrease in small volume ascites.  Stable, small bilateral pleural effusions with compressive atelectasis in the lower lobes.  Mild distention and small bowel loops without transition point, query ileus.  Stable presacral and body wall edema No fistula on IR drain study today.  Drain removed 12/20.      Blood loss anemia.  Hgb 10.9.. 8.9.. 10.. 8.2.  No PRBCs to date.       Hypoxia.  HFpEF. Bil pleural effusions. Cards following.  IDDM.  Sugars elevated in setting of steroids.       PLAN     Continue supportive care w abs, steroids.  ? Duration for ABX?  Repeat Remicade in 2 weeks.      Needs HBV vaccination series.  Should we delay this to outpt setting?    Azucena Freed  03/06/2022, 11:35 AM Phone [phone]

## 2022-03-06 NOTE — Progress Notes (Signed)
First dose of Remicade infusion done. Patient tolerated medication well. VSS.

## 2022-03-06 NOTE — Evaluation (Signed)
Physical Therapy Evaluation Patient Details Name: Richard [name] MRN: [id number] DOB: [birth year] Today's Date: 03/06/2022  History of Present Illness  Ashwath Lasch is 66 y.o. male with PMH significant for DM 2, HTN, hyperlipidemia, OSA, Crohn's disease and perforated sigmoid colong s/p resection and transverse colostomy on 09/32/67 complicated by intra abdominal abscess who has has been passing large folume hematochezia. Pt was discharged to CIR 12/15. Two days ago pt had sudden onset of bright red blodo per rectum. Currently psot op emergent flexible sigmoidoscopy with hemostatic gel application to seal multiple distal rectal ulcers.  Clinical Impression  Pt presents to acute care physical therapy following short sting in AIR due to rectal bleeding. Pt currently is very de-conditioned and is far from baseline. Pt was able to sit EOB with +2 assist for bed mobility and stand in stedy at EOB for greater than 5 min. Pt is + 2 assist for standing up to stedy. Pt will benefit from skilled physical therapy services in CIR following discharge from acute care hospital setting in order to return to PLOF. Prior to recent hospitalizations pt was very active and independent with all functional age related activities. O2 sats decreased with activity but remained 90% or above with 3 L O2 via [place].      Recommendations for follow up therapy are one component of a multi-disciplinary discharge planning process, led by the attending physician.  Recommendations may be updated based on patient status, additional functional criteria and insurance authorization.  Follow Up Recommendations Acute inpatient rehab (3hours/day)      Assistance Recommended at Discharge Frequent or constant Supervision/Assistance  Patient can return home with the following  Two people to help with walking and/or transfers;Assistance with cooking/housework;Assist for transportation;Help with stairs or ramp for entrance    Equipment  Recommendations Other (comment) (defer to post acute)  Recommendations for Other Services  Rehab consult    Functional Status Assessment Patient has had a recent decline in their functional status and demonstrates the ability to make significant improvements in function in a reasonable and predictable amount of time.     Precautions / Restrictions Precautions Precautions: None Precaution Comments: L colostomy, monitor O2/HR Restrictions Weight Bearing Restrictions: No      Mobility  Bed Mobility Overal bed mobility: Needs Assistance Bed Mobility: Supine to Sit, Sit to Supine Rolling: Max assist   Supine to sit: Mod assist, +2 for physical assistance Sit to supine: Mod assist, +2 for physical assistance   General bed mobility comments: Pt requires 2 person moderate assistance to go from supine<>sitting with assistance with bil LE and trunk with HOB elevated and heavy use of bed rails. Patient Response: Anxious, Flat affect  Transfers Overall transfer level: Needs assistance   Transfers: Sit to/from Stand Sit to Stand: Mod assist, +2 physical assistance           General transfer comment: Pt stood at 2 person Mod A with EOB elevated to Stedy standing for ~5 min or greater while resting on steady seat for linen change. Transfer via Lift Equipment: Stedy  Ambulation/Gait               General Gait Details: Due to current functional status unable to attempt       Balance Overall balance assessment: Needs assistance Sitting-balance support: No upper extremity supported, Feet supported Sitting balance-Leahy Scale: Fair Sitting balance - Comments: initially pt requires physical assistance to steady himself but was able to progress to no UE support sitting EOB  Standing balance support: During functional activity, Reliant on assistive device for balance, Bilateral upper extremity supported Standing balance-Leahy Scale: Poor Standing balance comment: Utilized stedy  to stand at EOB             Pertinent Vitals/Pain Pain Assessment Pain Assessment: Faces Faces Pain Scale: Hurts little more Pain Location: generalized during mobility and movement Pain Descriptors / Indicators: Grimacing, Discomfort Pain Intervention(s): Monitored during session, Repositioned    Home Living Family/patient expects to be discharged to:: Private residence Living Arrangements: Spouse/significant other Available Help at Discharge: Family Type of Home: House Home Access: Stairs to enter Entrance Stairs-Rails: Left Entrance Stairs-Number of Steps: 5 Alternate Level Stairs-Number of Steps: 15 down to his  office Home Layout: Two level Home Equipment: None      Prior Function Prior Level of Function : Independent/Modified Independent             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 ,       Journalist, newspaper   Dominant Hand: Right    Extremity/Trunk Assessment   Upper Extremity Assessment Upper Extremity Assessment: Defer to OT evaluation    Lower Extremity Assessment Lower Extremity Assessment: Generalized weakness RLE Sensation: history of peripheral neuropathy LLE Sensation: history of peripheral neuropathy    Cervical / Trunk Assessment Cervical / Trunk Assessment: Normal Cervical / Trunk Exceptions: Left side colostomy  Communication   Communication: HOH  Cognition Arousal/Alertness: Awake/alert Behavior During Therapy: Flat affect, Anxious Overall Cognitive Status: Within Functional Limits for tasks assessed         General Comments: Does not like alot of people in and out of his room and one time; it's too much at once.        General Comments General comments (skin integrity, edema, etc.): HR elevated to 120-s durign session. Spo2 on 2L 87-89% with activity. Increased to 3L and SPO2 increased to 90-91%        Assessment/Plan    PT Assessment Patient needs continued PT services  PT Problem List Decreased strength;Decreased  activity tolerance;Decreased mobility;Cardiopulmonary status limiting activity;Decreased knowledge of precautions;Pain;Impaired sensation;Decreased balance;Decreased skin integrity       PT Treatment Interventions DME instruction;Functional mobility training;Therapeutic activities;Patient/family education;Gait training;Balance training;Stair training;Neuromuscular re-education;Wheelchair mobility training;Therapeutic exercise;Manual techniques    PT Goals (Current goals can be found in the Care Plan section)  Acute Rehab PT Goals Patient Stated Goal: To get home once he is stronger PT Goal Formulation: With patient/family Time For Goal Achievement: 03/20/22 Potential to Achieve Goals: Fair    Frequency Min 3X/week     Co-evaluation PT/OT/SLP Co-Evaluation/Treatment: Yes Reason for Co-Treatment: Complexity of the patient's impairments (multi-system involvement);For patient/therapist safety PT goals addressed during session: Mobility/safety with mobility;Balance OT goals addressed during session: ADL's and self-care;Proper use of Adaptive equipment and DME;Strengthening/ROM       AM-PAC PT "6 Clicks" Mobility  Outcome Measure Help needed turning from your back to your side while in a flat bed without using bedrails?: A Lot Help needed moving from lying on your back to sitting on the side of a flat bed without using bedrails?: Total Help needed moving to and from a bed to a chair (including a wheelchair)?: Total Help needed standing up from a chair using your arms (e.g., wheelchair or bedside chair)?: Total Help needed to walk in hospital room?: Total Help needed climbing 3-5 steps with a railing? : Total 6 Click Score: 7    End of Session Equipment Utilized During Treatment: Gait belt Activity Tolerance:  Patient limited by fatigue Patient left: with bed alarm set;with call bell/phone within reach;with family/visitor present;in bed Nurse Communication: Mobility status;Need for  lift equipment PT Visit Diagnosis: Pain;Muscle weakness (generalized) (M62.81);Other abnormalities of gait and mobility (R26.89) Pain - Right/Left: Left Pain - part of body:  (abdomen and LE)    Time: 0177-9390 PT Time Calculation (min) (ACUTE ONLY): 46 min   Charges:   PT Evaluation $PT Eval Moderate Complexity: 1 Mod PT Treatments $Therapeutic Activity: 8-22 mins        Tomma Rakers, DPT, CLT  Acute Rehabilitation Services Office: [phone] (Secure chat preferred)   Ander Purpura 03/06/2022, 1:21 PM

## 2022-03-06 NOTE — Plan of Care (Signed)

## 2022-03-06 NOTE — Inpatient Diabetes Management (Signed)
Inpatient Diabetes Program Recommendations  AACE/ADA: New Consensus Statement on Inpatient Glycemic Control (2015)  Target Ranges:  Prepandial:   less than 140 mg/dL      Peak postprandial:   less than 180 mg/dL (1-2 hours)      Critically ill patients:  140 - 180 mg/dL   Lab Results  Component Value Date   GLUCAP 203 (H) 03/06/2022   HGBA1C 6.1 (H) 02/28/2022    Review of Glycemic Control  Latest Reference Range & Units 03/05/22 07:33 03/05/22 11:43 03/05/22 15:15 03/05/22 21:31 03/06/22 00:39 03/06/22 09:25  Glucose-Capillary 70 - 99 mg/dL 165 (H) 172 (H) 388 (H) 320 (H) 256 (H) 203 (H)  (H): Data is abnormally high  Outpatient Diabetes medications:  Jardiance 10 mg QD-Not taking Novolog 0-15 units TID and 0-5 units QHS Metformin 500 mg BID   Current orders for Inpatient glycemic control:  Novolog 0-15 units TID and 0-5 units QHS Solumedrol 40 mg QD   Inpatient Diabetes Program Recommendations:     Novolog 6 units tid with meals.    Will continue to follow while inpatient.  Thank you, Reche Dixon, MSN, [place] [facility] Coordinator Inpatient Diabetes Program [phone] (team pager from 8a-5p)

## 2022-03-06 NOTE — Progress Notes (Signed)
Progress Note     Subjective: Pt denies abdominal pain or n/v. Having ostomy output. Denies further rectal bleeding overnight. Going for IR drain study this AM.   Objective: Vital signs in last 24 hours: Temp:  [98.7 F (37.1 C)-99.1 F (37.3 C)] 99 F (37.2 C) (12/20 0645) Pulse Rate:  [100-120] 106 (12/20 0645) Resp:  [16-18] 18 (12/20 0645) BP: (106-133)/(67-75) 133/75 (12/20 0645) SpO2:  [96 %-98 %] 96 % (12/20 0645)    Intake/Output from previous day: 12/19 0701 - 12/20 0700 In: -  Out: 470 [Drains:20; Stool:450] Intake/Output this shift: No intake/output data recorded.  PE: General: pleasant, WD, obese male who is laying in bed in NAD Heart: regular, rate, and rhythm.   Lungs: Respiratory effort nonlabored on Dahlen Abd: soft, NT, mild distention, ostomy productive, drain in LLQ with scant drainage   Lab Results:  Recent Labs    03/05/22 1350 03/06/22 0202  WBC 17.6* 10.5  HGB 10.0* 8.2*  HCT 30.4* 25.6*  PLT 374 332    BMET Recent Labs    03/04/22 0425 03/04/22 1433 03/06/22 0202  NA 136 138 138  K 3.9 4.0 3.8  CL 98 98 102  CO2 30  --  29  GLUCOSE 249* 307* 214*  BUN '21 17 14  '$ CREATININE 0.45* 0.40* 0.37*  CALCIUM 8.7*  --  8.3*    PT/INR No results for input(s): "LABPROT", "INR" in the last 72 hours. CMP     Component Value Date/Time   NA 138 03/06/2022 0202   K 3.8 03/06/2022 0202   CL 102 03/06/2022 0202   CO2 29 03/06/2022 0202   GLUCOSE 214 (H) 03/06/2022 0202   BUN 14 03/06/2022 0202   CREATININE 0.37 (L) 03/06/2022 0202   CREATININE 0.63 (L) 03/10/2019 1436   CALCIUM 8.3 (L) 03/06/2022 0202   PROT 5.4 (L) 03/06/2022 0202   ALBUMIN 2.1 (L) 03/06/2022 0202   AST 23 03/06/2022 0202   ALT 19 03/06/2022 0202   ALKPHOS 99 03/06/2022 0202   BILITOT 0.4 03/06/2022 0202   GFRNONAA >60 03/06/2022 0202   Lipase  No results found for: "LIPASE"     Studies/Results: ECHOCARDIOGRAM COMPLETE  Result Date: 03/05/2022     ECHOCARDIOGRAM REPORT   Patient Name:   ARGYLE GUSTAFSON Date of Exam: 03/05/2022 Medical Rec #:  [id number]      Height:       69.0 in Accession #:    [id number]     Weight:       264.3 lb Date of Birth:  [birth year]      BSA:          2.326 m Patient Age:    66 years       BP:           118/77 mmHg Patient Gender: M              HR:           115 bpm. Exam Location:  Inpatient Procedure: 2D Echo, Cardiac Doppler and Color Doppler Indications:    CHF  History:        Patient has no prior history of Echocardiogram examinations.                 Risk Factors:Diabetes, Sleep Apnea, Dyslipidemia and                 Hypertension.  Sonographer:    Clayton Lefort RDCS (AE) Referring Phys: [id number] [facility]  A CHANDRASEKHAR  Sonographer Comments: Limited patient mobility. IMPRESSIONS  1. Left ventricular ejection fraction, by estimation, is 60 to 65%. The left ventricle has normal function. The left ventricle has no regional wall motion abnormalities. Indeterminate diastolic filling due to E-A fusion.  2. Right ventricular systolic function is normal. The right ventricular size is normal. Tricuspid regurgitation signal is inadequate for assessing PA pressure.  3. The mitral valve is grossly normal. Trivial mitral valve regurgitation. No evidence of mitral stenosis.  4. The aortic valve is tricuspid. Aortic valve regurgitation is not visualized. No aortic stenosis is present.  5. The inferior vena cava is normal in size with greater than 50% respiratory variability, suggesting right atrial pressure of 3 mmHg. FINDINGS  Left Ventricle: Left ventricular ejection fraction, by estimation, is 60 to 65%. The left ventricle has normal function. The left ventricle has no regional wall motion abnormalities. The left ventricular internal cavity size was normal in size. There is  no left ventricular hypertrophy. Indeterminate diastolic filling due to E-A fusion. Right Ventricle: The right ventricular size is normal. No increase in right  ventricular wall thickness. Right ventricular systolic function is normal. Tricuspid regurgitation signal is inadequate for assessing PA pressure. Left Atrium: Left atrial size was normal in size. Right Atrium: Right atrial size was normal in size. Pericardium: Trivial pericardial effusion is present. Presence of epicardial fat layer. Mitral Valve: The mitral valve is grossly normal. Trivial mitral valve regurgitation. No evidence of mitral valve stenosis. Tricuspid Valve: The tricuspid valve is grossly normal. Tricuspid valve regurgitation is not demonstrated. No evidence of tricuspid stenosis. Aortic Valve: The aortic valve is tricuspid. Aortic valve regurgitation is not visualized. No aortic stenosis is present. Aortic valve mean gradient measures 4.0 mmHg. Aortic valve peak gradient measures 8.4 mmHg. Aortic valve area, by VTI measures 2.98 cm. Pulmonic Valve: The pulmonic valve was grossly normal. Pulmonic valve regurgitation is not visualized. No evidence of pulmonic stenosis. Aorta: The aortic root and ascending aorta are structurally normal, with no evidence of dilitation. Venous: The inferior vena cava is normal in size with greater than 50% respiratory variability, suggesting right atrial pressure of 3 mmHg. IAS/Shunts: The atrial septum is grossly normal.  LEFT VENTRICLE PLAX 2D LVIDd:         4.20 cm LVIDs:         2.60 cm LV PW:         1.30 cm LV IVS:        1.10 cm LVOT diam:     2.20 cm LV SV:         59 LV SV Index:   25 LVOT Area:     3.80 cm  RIGHT VENTRICLE RV Basal diam:  2.80 cm RV S prime:     19.00 cm/s TAPSE (M-mode): 2.0 cm LEFT ATRIUM             Index        RIGHT ATRIUM           Index LA diam:        2.70 cm 1.16 cm/m   RA Area:     13.60 cm LA Vol (A2C):   23.7 ml 10.19 ml/m  RA Volume:   31.10 ml  13.37 ml/m LA Vol (A4C):   25.2 ml 10.83 ml/m LA Biplane Vol: 25.4 ml 10.92 ml/m  AORTIC VALVE AV Area (Vmax):    3.01 cm AV Area (Vmean):   3.03 cm AV Area (VTI):  2.98 cm AV  Vmax:           145.00 cm/s AV Vmean:          94.300 cm/s AV VTI:            0.198 m AV Peak Grad:      8.4 mmHg AV Mean Grad:      4.0 mmHg LVOT Vmax:         115.00 cm/s LVOT Vmean:        75.200 cm/s LVOT VTI:          0.155 m LVOT/AV VTI ratio: 0.78  AORTA Ao Root diam: 3.80 cm Ao Asc diam:  3.90 cm  SHUNTS Systemic VTI:  0.16 m Systemic Diam: 2.20 cm Eleonore Chiquito MD Electronically signed by Eleonore Chiquito MD Signature Date/Time: 03/05/2022/9:58:22 AM    Final    CT ABDOMEN PELVIS W CONTRAST  Result Date: 03/04/2022 CLINICAL DATA:  Left lower quadrant abscess status post IR drainage. Crohn's disease. EXAM: CT ABDOMEN AND PELVIS WITH CONTRAST TECHNIQUE: Multidetector CT imaging of the abdomen and pelvis was performed using the standard protocol following bolus administration of intravenous contrast. RADIATION DOSE REDUCTION: This exam was performed according to the departmental dose-optimization program which includes automated exposure control, adjustment of the mA and/or kV according to patient size and/or use of iterative reconstruction technique. CONTRAST:  20m OMNIPAQUE IOHEXOL 350 MG/ML SOLN COMPARISON:  CTA abdomen and pelvis 02/25/2022 FINDINGS: Lower chest: There are stable small bilateral pleural effusions with compressive atelectasis of the bilateral lower lobes. Hepatobiliary: No focal liver abnormality is seen. No gallstones, gallbladder wall thickening, or biliary dilatation. Pancreas: Unremarkable. No pancreatic ductal dilatation or surrounding inflammatory changes. Spleen: Normal in size without focal abnormality. Adrenals/Urinary Tract: Adrenal glands are unremarkable. Kidneys are normal, without renal calculi, focal lesion, or hydronephrosis. Bladder is unremarkable. Stomach/Bowel: Left lower quadrant colostomy again noted. Rectal stump is distended with stool similar to the prior study. There is no evidence for bowel obstruction, pneumatosis or free air. There are some distended small  bowel loops without transition point in the left abdomen which may be related to ileus. Stomach is within normal limits. Vascular/Lymphatic: Aortic atherosclerosis. No enlarged abdominal or pelvic lymph nodes. Reproductive: Prostate is unremarkable. Other: There is small volume ascites which has mildly decreased. Mesenteric edema in the left abdomen has decreased. There is a new percutaneous drainage catheter ending in left lower quadrant. Left lower quadrant fluid collection has resolved in the interval. No new enhancing fluid collections are seen. Presacral edema is stable. Body wall edema is stable. Musculoskeletal: Degenerative changes affect the spine IMPRESSION: 1. Interval placement of percutaneous drainage catheter in the left lower quadrant. Left lower quadrant fluid collection has resolved in the interval. No new fluid collection identified. 2. Small volume ascites has mildly decreased. 3. Stable small bilateral pleural effusions with compressive atelectasis of the bilateral lower lobes. 4. Mildly distended small bowel loops without transition point may be related to ileus. 5. Stable presacral and body wall edema. Aortic Atherosclerosis (ICD10-I70.0). Electronically Signed   By: ARonney AstersM.D.   On: 03/04/2022 22:24    Anti-infectives: Anti-infectives (From admission, onward)    Start     Dose/Rate Route Frequency Ordered Stop   03/05/22 2000  metroNIDAZOLE (FLAGYL) IVPB 500 mg        500 mg 100 mL/hr over 60 Minutes Intravenous Every 12 hours 03/05/22 0705     03/05/22 1800  cefTRIAXone (ROCEPHIN) 2 g in sodium chloride  0.9 % 100 mL IVPB        2 g 200 mL/hr over 30 Minutes Intravenous Every 24 hours 03/05/22 0705     03/05/22 0800  cefTRIAXone (ROCEPHIN) injection 2 g  Status:  Discontinued        2 g Intramuscular Every 24 hours 03/05/22 0705 03/05/22 0716        Assessment/Plan   Sigmoid perforation  POD27 S/P exploratory laparotomy, colon resection, end colostomy 02/07/22 Dr.  [name] - having ostomy output, continue fiber and iron   - CT 12/1 with some free fluid but no organized collection, CT 12/5 with no major changes - BRBPR 12/11 - s/p flex-sig which showed bleeding ulcerations. 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 This will need to be compounded at outside pharmacy - steroids for acute Crohn's initiated 12/13 - CT 12/18 with resolution in LLQ collection and decrease in small volume ascites - IR drain study this AM did not demonstrate fistula, drain removed  - GI considering remicade initiation soon - recommend holding off on colonoscopy currently to avoid putting extra tension on stoma acutely     FEN: CM diet; cont iron and vit c VTE: LMWH on hold  ID: vanc/flagyl/cefepime 11/23; zosyn 11/24>11/29; vanc 12/1>12/4; merrem 12/1> 12/5; Zosyn 12/11>12/15; Rocephin/flagyl 12/15>>   - below per primary -  Post-operative respiratory insufficiency - concern for possible CHF, cardiology was consulting and planning echo today  T2DM Physical deconditioning   HTN HLD Obesity class I OSA Hypogonadism Moderate protein calorie malnutrition  ADHD/Anxiety     LOS: 1 day     Norm Parcel, [facility] 03/06/2022, 9:22 AM Please see Amion for pager number during day hours 7:00am-4:30pm

## 2022-03-06 NOTE — Evaluation (Signed)
Occupational Therapy Evaluation Patient Details Name: Richard [name] MRN: [id number] DOB: [birth year] Today's Date: 03/06/2022   History of Present Illness Richard [name] is 66 y.o. male with PMH significant for DM 2, HTN, hyperlipidemia, OSA, Crohn's disease and perforated sigmoid colong s/p resection and transverse colostomy on 34/19/62 complicated by intra abdominal abscess who has has been passing large folume hematochezia. Pt was discharged to CIR 12/15. Two days ago pt had sudden onset of bright red blood per rectum. Currently psot op emergent flexible sigmoidoscopy with hemostatic gel application to seal multiple distal rectal ulcers.   Clinical Impression   Pt in bed upon therapy arrival and agreeable to participate in OT evaluation. Wife present in room. Pt coming from CIR  where we left due to new onset of rectal bleeding. Prior to hospitalization, pt was independent with all ADL tasks, while also working from home. Currently, pt is requiring 2 person assist to complete BADL tasks and functional transfers. Pt presents with decreased strength, activity tolerance, endurance, and increased pain requiring increased physical assistance to complete BADL tasks and functional transfers. Recommend pt return to CIR when medically stable to focus on mentioned deficits and allow him to return home safely with his wife providing assistance. OT will follow patient acutely.      Recommendations for follow up therapy are one component of a multi-disciplinary discharge planning process, led by the attending physician.  Recommendations may be updated based on patient status, additional functional criteria and insurance authorization.   Follow Up Recommendations  Acute inpatient rehab (3hours/day)     Assistance Recommended at Discharge Frequent or constant Supervision/Assistance  Patient can return home with the following Two people to help with walking and/or transfers;A lot of help with  bathing/dressing/bathroom;Assistance with cooking/housework;Direct supervision/assist for medications management;Assist for transportation;Direct supervision/assist for financial management;Help with stairs or ramp for entrance    Functional Status Assessment  Patient has had a recent decline in their functional status and demonstrates the ability to make significant improvements in function in a reasonable and predictable amount of time.  Equipment Recommendations  Other (comment) (Defer to next venue of care)    Recommendations for Other Services Rehab consult     Precautions / Restrictions Precautions Precautions: None Precaution Comments: L colostomy, monitor O2/HR Restrictions Weight Bearing Restrictions: No      Mobility Bed Mobility Overal bed mobility: Needs Assistance Bed Mobility: Supine to Sit, Sit to Supine, Rolling Rolling: Mod assist   Supine to sit: Min guard, HOB elevated Sit to supine: Max assist (Assist to bring BLEs up onto bed. Pt able to use bed rail to manage UB.)     Patient Response: Flat affect  Transfers Overall transfer level: Needs assistance Equipment used:  Charlaine Dalton) Transfers: Sit to/from Stand Sit to Stand: Mod assist, From elevated surface, +2 physical assistance (via Stedy)                  Balance Overall balance assessment: Needs assistance Sitting-balance support: No upper extremity supported, Feet supported Sitting balance-Leahy Scale: Fair     Standing balance support: During functional activity, Reliant on assistive device for balance, Bilateral upper extremity supported Standing balance-Leahy Scale: Poor Standing balance comment: Utilized stedy to stand at EOB         ADL either performed or assessed with clinical judgement   ADL Overall ADL's : Needs assistance/impaired     Grooming: Oral care;Wash/dry face;Set up;Bed level   Upper Body Bathing: Maximal assistance;Sitting Upper Body Bathing Details (indicate  cue  type and reason): Due to multiple lines and leads. Lower Body Bathing: Total assistance;Bed level   Upper Body Dressing : Maximal assistance;Sitting   Lower Body Dressing: Bed level;Total assistance   Toilet Transfer:  (NT)   Toileting- Clothing Manipulation and Hygiene: Total assistance;Bed level               Vision Baseline Vision/History: 1 Wears glasses Ability to See in Adequate Light: 1 Impaired Patient Visual Report: No change from baseline Vision Assessment?: No apparent visual deficits            Pertinent Vitals/Pain Pain Assessment Pain Assessment: Faces Faces Pain Scale: Hurts little more Pain Location: generalized during mobility and movement Pain Descriptors / Indicators: Grimacing, Discomfort Pain Intervention(s): Monitored during session     Hand Dominance Right   Extremity/Trunk Assessment Upper Extremity Assessment Upper Extremity Assessment: Defer to OT evaluation   Lower Extremity Assessment Lower Extremity Assessment: Generalized weakness RLE Sensation: history of peripheral neuropathy LLE Sensation: history of peripheral neuropathy   Cervical / Trunk Assessment Cervical / Trunk Assessment: Normal Cervical / Trunk Exceptions: Left side colostomy   Communication Communication Communication: HOH   Cognition Arousal/Alertness: Awake/alert Behavior During Therapy: Flat affect Overall Cognitive Status: Within Functional Limits for tasks assessed       General Comments: Does not like alot of people in and out of his room and one time; it's too much at once.     General Comments  HR elevated to 120-s durign session. Spo2 on 2L 87-89% with activity. Increased to 3L and SPO2 increased to 90-91%            Home Living Family/patient expects to be discharged to:: Private residence Living Arrangements: Spouse/significant other Available Help at Discharge: Family Type of Home: House Home Access: Stairs to enter Technical brewer  of Steps: 5 Entrance Stairs-Rails: Left Home Layout: Two level Alternate Level Stairs-Number of Steps: 15 down to his  office Alternate Level Stairs-Rails: Left Bathroom Shower/Tub: Occupational psychologist: Standard Bathroom Accessibility: Yes How Accessible: Accessible via walker Home Equipment: None      Lives With: Spouse    Prior Functioning/Environment Prior Level of Function : Independent/Modified Independent     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 ,          OT Problem List: Decreased activity tolerance;Decreased safety awareness;Decreased knowledge of precautions;Pain;Cardiopulmonary status limiting activity;Decreased knowledge of use of DME or AE;Decreased strength;Impaired balance (sitting and/or standing);Increased edema      OT Treatment/Interventions: Self-care/ADL training;Therapeutic exercise;Energy conservation;DME and/or AE instruction;Neuromuscular education;Therapeutic activities;Patient/family education;Balance training;Manual therapy;Modalities    OT Goals(Current goals can be found in the care plan section) Acute Rehab OT Goals Patient Stated Goal: to go home OT Goal Formulation: With patient/family Time For Goal Achievement: 03/20/22 Potential to Achieve Goals: Good  OT Frequency: Min 2X/week    Co-evaluation PT/OT/SLP Co-Evaluation/Treatment: Yes Reason for Co-Treatment: Complexity of the patient's impairments (multi-system involvement);For patient/therapist safety PT goals addressed during session: Mobility/safety with mobility;Balance OT goals addressed during session: ADL's and self-care;Proper use of Adaptive equipment and DME;Strengthening/ROM      AM-PAC OT "6 Clicks" Daily Activity     Outcome Measure Help from another person eating meals?: None Help from another person taking care of personal grooming?: A Little Help from another person toileting, which includes using toliet, bedpan, or urinal?: Total Help from another person  bathing (including washing, rinsing, drying)?: A Lot Help from another person to put on and taking off  regular upper body clothing?: A Little Help from another person to put on and taking off regular lower body clothing?: Total 6 Click Score: 14   End of Session Equipment Utilized During Treatment: Other (comment) Charlaine Dalton) Nurse Communication: Need for lift equipment;Mobility status  Activity Tolerance: Patient tolerated treatment well;Patient limited by fatigue Patient left: in bed;with call bell/phone within reach;with nursing/sitter in room;with family/visitor present  OT Visit Diagnosis: Unsteadiness on feet (R26.81);Other abnormalities of gait and mobility (R26.89);Pain;Muscle weakness (generalized) (M62.81)                Time: 8979-1504 OT Time Calculation (min): 50 min Charges:  OT General Charges $OT Visit: 1 Visit OT Evaluation $OT Eval High Complexity: 1 High  [employer], OTR/L,CBIS  Supplemental OT - [facility] and [facility]   Dane Kopke, Clarene Duke 03/06/2022, 1:19 PM

## 2022-03-06 NOTE — Progress Notes (Signed)
Inpatient Rehab Admissions Coordinator:    Pt. Wishes to return to CIR. I will follow for potential re-admit pending insurance auth and bed availability.   Clemens Catholic, [place], [place] Admissions Coordinator  [phone] ([place]) [phone] (office)

## 2022-03-06 NOTE — Discharge Summary (Signed)
Physician Discharge Summary  Patient ID: JAKOTA MANTHEI MRN: [id number] DOB/AGE: 04/06/1955 66 y.o.  Admit date: 03/01/2022 Discharge date: 03/05/2022  Discharge Diagnoses:  Principal Problem:   Debility Active Problems:   ABLA (acute blood loss anemia)   Crohn's disease of large intestine with abscess ([place])   Hematochezia   Rectal ulcer   Discharged Condition: stable  Significant Diagnostic Studies:   CT ABDOMEN PELVIS W CONTRAST  Result Date: 03/04/2022 CLINICAL DATA:  Left lower quadrant abscess status post IR drainage. Crohn's disease. EXAM: CT ABDOMEN AND PELVIS WITH CONTRAST TECHNIQUE: Multidetector CT imaging of the abdomen and pelvis was performed using the standard protocol following bolus administration of intravenous contrast. RADIATION DOSE REDUCTION: This exam was performed according to the departmental dose-optimization program which includes automated exposure control, adjustment of the mA and/or kV according to patient size and/or use of iterative reconstruction technique. CONTRAST:  42m OMNIPAQUE IOHEXOL 350 MG/ML SOLN COMPARISON:  CTA abdomen and pelvis 02/25/2022 FINDINGS: Lower chest: There are stable small bilateral pleural effusions with compressive atelectasis of the bilateral lower lobes. Hepatobiliary: No focal liver abnormality is seen. No gallstones, gallbladder wall thickening, or biliary dilatation. Pancreas: Unremarkable. No pancreatic ductal dilatation or surrounding inflammatory changes. Spleen: Normal in size without focal abnormality. Adrenals/Urinary Tract: Adrenal glands are unremarkable. Kidneys are normal, without renal calculi, focal lesion, or hydronephrosis. Bladder is unremarkable. Stomach/Bowel: Left lower quadrant colostomy again noted. Rectal stump is distended with stool similar to the prior study. There is no evidence for bowel obstruction, pneumatosis or free air. There are some distended small bowel loops without transition point in the left  abdomen which may be related to ileus. Stomach is within normal limits. Vascular/Lymphatic: Aortic atherosclerosis. No enlarged abdominal or pelvic lymph nodes. Reproductive: Prostate is unremarkable. Other: There is small volume ascites which has mildly decreased. Mesenteric edema in the left abdomen has decreased. There is a new percutaneous drainage catheter ending in left lower quadrant. Left lower quadrant fluid collection has resolved in the interval. No new enhancing fluid collections are seen. Presacral edema is stable. Body wall edema is stable. Musculoskeletal: Degenerative changes affect the spine IMPRESSION: 1. Interval placement of percutaneous drainage catheter in the left lower quadrant. Left lower quadrant fluid collection has resolved in the interval. No new fluid collection identified. 2. Small volume ascites has mildly decreased. 3. Stable small bilateral pleural effusions with compressive atelectasis of the bilateral lower lobes. 4. Mildly distended small bowel loops without transition point may be related to ileus. 5. Stable presacral and body wall edema. Aortic Atherosclerosis (ICD10-I70.0). Electronically Signed   By: ARonney AstersM.D.   On: 03/04/2022 22:24   DG Chest 2 View  Result Date: 03/03/2022 CLINICAL DATA:  200808 Hypoxia 200808 [id number] CXR [id number]: PORTABLE CHEST two views COMPARISON:  03/02/2022 FINDINGS: Cardiac silhouette is prominent. There is pulmonary interstitial prominence with vascular congestion. No focal consolidation. No pneumothorax identified. Large bilateral pleural effusions moderate layering bilateral pleural effusions. Right-sided PICC tip overlies mid SVC. IMPRESSION: Findings suggest CHF. Electronically Signed   By: JSammie BenchM.D.   On: 03/03/2022 12:59   DG CHEST PORT 1 VIEW  Result Date: 03/02/2022 CLINICAL DATA:  Cough EXAM: PORTABLE CHEST 1 VIEW COMPARISON:  Chest x-ray February 11, 2022 FINDINGS: Right upper extremity PICC  terminates in the mid SVC. Unchanged cardiomegaly. Stable mediastinal contours. Bibasilar bandlike opacities. Small right pleural effusion. No large pneumothorax. The visualized upper abdomen is unremarkable. No acute osseous abnormality. IMPRESSION: 1. Bibasilar  bandlike opacities, likely atelectasis but aspiration/infection could appear similarly in the appropriate clinical context. 2. Small right pleural effusion. Electronically Signed   By: Beryle Flock M.D.   On: 03/02/2022 18:32    Labs:  Basic Metabolic Panel: Recent Labs  Lab 02/28/22 0410 03/01/22 0447 03/03/22 1842 03/04/22 0425 03/04/22 1433  NA 140 142 137 136 138  K 4.2 4.0 4.2 3.9 4.0  CL 101 103 101 98 98  CO2 '30 30 28 30  '$ --   GLUCOSE 275* 252* 358* 249* 307*  BUN '11 16 21 21 17  '$ CREATININE 0.37* 0.35* 0.57* 0.45* 0.40*  CALCIUM 8.0* 8.0* 8.5* 8.7*  --   MG 1.8 1.8  --   --   --   PHOS 2.7 2.3*  --   --   --     CBC: Recent Labs  Lab 03/01/22 0447 03/03/22 1000 03/04/22 0425 03/04/22 1433 03/05/22 0012  WBC 7.3 13.4* 10.4  --  10.7*  NEUTROABS 5.7 11.4* 8.0*  --   --   HGB 8.1* 10.4* 9.4*   < > 8.9*  HCT 25.9* 32.7* 29.2*   < > 27.3*  MCV 99.6 97.6 98.0  --  97.5  PLT 262 359 325  --  328   < > = values in this interval not displayed.     Brief HPI:   NYKEEM CITRO is a 66 y.o. male with history of T2DM, obesity, severe OSA, ADHD, HTN, recent scrotal abscess 10/2021's s/p I&D, recent diagnosis of Crohn's colitis who was started on prednisone on 02/04/2022.  He was admitted to [facility] long hospital 02/07/2022 with sepsis due to perforated bowel with acute peritonitis.  CTA chest was negative for PE.  He was taken to the OR emergently for exploratory lap with mobilization of splenic flexure, resection of descending and sigmoid colon with transverse colostomy by Dr. [name] on same day.  Hospital course was significant for ongoing issues with abdominal distention with low-grade fevers, ileus, electrolyte  abnormalities and nephrology was consulted for input and management.  He also had issues with confusion and worsening of symptoms felt to be due to PNA and antibiotics changed to meropenem and bank.  He was started on TNA for nutritional support but continued to have fevers and tachycardia felt to be due to underlying IBD.  GI was consulted for input and Reglan was added to help manage nausea with improvement of symptoms.  NG tube was removed on 12/05 and he was started on liquid diet and weaned off TPN.  He did developed issues with fluid overload and was treated with high-dose furosemide.  5: Was added to help manage high volume output via colostomy.  He developed acute blood loss anemia with BRBPR on 12/11 and CT abdomen done revealing acute extravasation from rectum with 10 cm rim-enhancing fluid collection of bowel staple line in LLQ.  He underwent colonoscopy by Dr. Rush Landmark revealing blood in rectum, rectal varices and multiple ulcers in the mid and distal rectum felt to be the source of bleeding as well as a deeply cratered mid rectal ulcer that was treated with p.o. statin was scheduled.  Recommendations was for no chemical VTE PPx X.  He also underwent CT-guided paracentesis LLQ abscess with drain placement by interventional radiology on 12/12.  LUE duplex done due to edema and was technically difficult study but did not show evidence of DVT.  He was started on Solu-Medrol 12/13 with GI recommending ostomy evaluation as well as CT  of abdomen post abscess drainage to decide on consideration of Remicade infusion.  He did develop a rash that was felt to be due to Zosyn.  Abdominal culture grew out E. coli and Bacteroides Thetaitoamciron and per recommendations by Dr. Juleen China, antibiotics were transitioned to ceftriaxone and Flagyl while LUQ drain to remain in place and follow-up with ID for input on plan to transition to oral antibiotics.  His rash was resolving and intake was improving.  Therapy was  working with patient who continued to be limited by weakness, dizziness with standing attempts and steady as well as fatigue.  He was evaluated by rehab team and CIR was recommended due to functional decline.   Hospital Course: KENNEITH STIEF was admitted to rehab 03/01/2022 for inpatient therapies to consist of PT, ST and OT at least three hours five days a week. Past admission physiatrist, therapy team and rehab RN have worked together to provide customized collaborative inpatient rehab. Evaluations done past admission revealed that patient required max +2 assist with mobility and max assist with basic self care tasks. He continued to refuse to use his CPAP during his stay and preferred to use his oxygen. LLQ drain care was performed TID and continued to have purulent drainage during his stay. Mid line incision was managed with daily wet to dry dressing changes. Protein supplements and vitamins were added to help promote wound healing.  His blood pressures were monitored on TID basis and were noted to be stable.  He was noted to have 2+ edema BLE and p.o. Lasix added.  He develop recurrent hypoxia on 12/17 and was found to have CHF.  Cardiology was consulted for input and was started on IV diuresis with improvement in peripheral edema.  Follow up labs showed renal status and electrolytes to be WNL. CBC was monitored daily and was stable overall. His diabetes was  been monitored with ac/hs CBG checks and insulin glargine was added due to poorly controlled BS. His po intake was variable and wife was advised to supplement with food from home as able. SSI was use prn for tighter BS control. Hospital course significant for recurrent rectal bleeding on am of 12/15 and follow up CBC showed H/H to be stable. GI was consulted and he underwent flex sig revealing multiple ulcers in distal rectum and hemostatic PuraSTAT gel was applied. Dr. Ellis Savage felt that mucosa was significantly improved from colonscopy in  November and recommended eventual plans to start Remicade. General surgery did not recommend colonoscopy via stoma at this time. . CT abdomen/pelvis showed interval resolution of fluid collection.  2 D echo were ordered but pending.  Later that evening, patient developed significant rectal bleeding and was transferred to acute floor for closer monitoring. recommended   Diet: Regular.  Medications at discharge:  Prosource 30 cc po TID Vitamin C 500 mg BID Lipitor 40 mg po daily Ceftriaxone 2 gram/daily Feso4 325 mg po bid. Mucinex 600 mg po bid Insulin Glargine 12 units daily Solu- Medrol 125 mg IV daily Reglan 5 mg po BID Metoprolol 6.25 mg po BID Flagyl 500 mg IV BID MVI po daily.  13. Juven po bid 14. Protonix 40 mg po daily 15. Fibercon 625 mg po bid 16. Vitamin B1 po dialy 17. Zincs sulfate 220 mcg po BID 18. Ensure Max BID.   Disposition:  Acute hospital    Signed: Bary Leriche 03/06/2022, 4:33 PM

## 2022-03-06 NOTE — Progress Notes (Signed)
Richard [name]  FKC:[id number] DOB: Jun 22, 1955 DOA: 03/05/2022 PCP: [name], MD    Brief Narrative:  66 year old with a history of DM2, HTN, HLD, OSA, Crohn's disease, and perforated sigmoid colon status post resection with transverse colostomy 74/94/4967 complicated by intra-abdominal abscess who presented to the ER with large-volume hematochezia.  He had been discharged to the inpatient rehab unit 03/01/2022 after a preceding admission during which sigmoidoscopy revealed numerous rectal ulcers which were treated with hemostatic gel.  While in rehab he suffered the acute return of BRBPR but no bleeding from his ostomy.  Consultants:  Gastroenterology  Goals of Care:  Code Status: Full Code   DVT prophylaxis: SCDs  Interim Hx: Afebrile.  Some sinus tachycardia.  Blood pressure stable.  Oxygen saturation 96% on 2 L.  Feeling stable overall.  No new complaints today.  Is anxious to get back to rehab so that he can eventually discharge home.  Assessment & Plan:  Acute lower GI bleeding/large-volume hematochezia Underwent flexible sigmoidoscopy 12/18 noting multiple rectal ulcers which were treated with hemostatic gel -appears to have stabilized for now  Recent intra-abdominal abscess Underwent percutaneous drain placement in IR 02/26/2022 -cultures grew E. coli and Bacteroides species -continue Rocephin and Flagyl as per previous plan -follow-up imaging of drain today noted resolution of abscess cavity therefore drain removed  Hypoxia -pleural effusions Possible pleural effusions noted on CXR 12/17 - f/u in AM  Hypomagnesemia Due to poor intake and increased GI loss -recheck in a.m.  Crohn's disease On chronic steroid therapy - Remicade has been dosed with repeat dosing planned in 2 weeks -Solu-Medrol to be transition to prednisone at time of discharge with plan to transition to off -short-chain fatty acid enema dosed today  DM2 A1c 6.15 February 2022  ?CHF - clinically  ruled out  TTE noted EF 60-65% with no LVH and no significant valvular disease but unable to comment on diastolic function -suspect this is simply due to low albumin state and not indicative of true CHF  Physical debility Will likely require return to CIR once medical issues stabilized   Family Communication: Spoke with wife at bedside Disposition: From CIR -anticipate return to CIR as soon as 12/21   Objective: Blood pressure (P) 120/81, pulse (!) (P) 110, temperature (P) 98.6 F (37 C), temperature source (P) Oral, resp. rate (P) 19, SpO2 (P) 97 %.  Intake/Output Summary (Last 24 hours) at 03/06/2022 1114 Last data filed at 03/06/2022 0700 Gross per 24 hour  Intake --  Output 470 ml  Net -470 ml   There were no vitals filed for this visit.  Examination: General: No acute respiratory distress Lungs: Clear to auscultation bilaterally without wheezes or crackles Cardiovascular: Regular rate and rhythm without murmur gallop or rub normal S1 and S2 Abdomen: Nontender, nondistended, soft, bowel sounds positive, no rebound, no ascites, no appreciable mass Extremities: No significant cyanosis, clubbing, or edema bilateral lower extremities  CBC: Recent Labs  Lab 03/01/22 0447 03/03/22 1000 03/04/22 0425 03/04/22 1433 03/05/22 0012 03/05/22 1350 03/06/22 0202  WBC 7.3 13.4* 10.4  --  10.7* 17.6* 10.5  NEUTROABS 5.7 11.4* 8.0*  --   --   --   --   HGB 8.1* 10.4* 9.4*   < > 8.9* 10.0* 8.2*  HCT 25.9* 32.7* 29.2*   < > 27.3* 30.4* 25.6*  MCV 99.6 97.6 98.0  --  97.5 97.4 99.2  PLT 262 359 325  --  328 374 332   < > =  values in this interval not displayed.   Basic Metabolic Panel: Recent Labs  Lab 02/28/22 0410 03/01/22 0447 03/03/22 1842 03/04/22 0425 03/04/22 1433 03/06/22 0202  NA 140 142 137 136 138 138  K 4.2 4.0 4.2 3.9 4.0 3.8  CL 101 103 101 98 98 102  CO2 '30 30 28 30  '$ --  29  GLUCOSE 275* 252* 358* 249* 307* 214*  BUN '11 16 21 21 17 14  '$ CREATININE 0.37*  0.35* 0.57* 0.45* 0.40* 0.37*  CALCIUM 8.0* 8.0* 8.5* 8.7*  --  8.3*  MG 1.8 1.8  --   --   --  1.3*  PHOS 2.7 2.3*  --   --   --   --    GFR: Estimated Creatinine Clearance: 117.7 mL/min (A) (by C-G formula based on SCr of 0.37 mg/dL (L)).   Scheduled Meds:  atorvastatin  40 mg Oral Daily   Chlorhexidine Gluconate Cloth  6 each Topical Q0600   guaiFENesin  600 mg Oral BID   insulin aspart  0-15 Units Subcutaneous TID WC   insulin aspart  0-5 Units Subcutaneous QHS   insulin aspart  3 Units Subcutaneous TID WC   lip balm  1 Application Topical BID   methylPREDNISolone (SOLU-MEDROL) injection  40 mg Intravenous Daily   pantoprazole  40 mg Oral Daily   [START ON 03/07/2022] Short Chain Fatty Acid Enema  1 enema Rectal Daily   sodium chloride flush  10-40 mL Intracatheter Q12H   sodium chloride flush  3 mL Intravenous Q12H   thiamine  100 mg Oral Daily   zinc sulfate  220 mg Oral Daily   Continuous Infusions:  cefTRIAXone (ROCEPHIN)  IV 2 g (03/05/22 1712)   inFLIXimab (REMICADE) 5 mg/kg = 600 mg in sodium chloride 0.9 % 500 mL infusion     magnesium sulfate bolus IVPB 4 g (03/06/22 1030)   metroNIDAZOLE 500 mg (03/06/22 0817)     LOS: 1 day   Cherene Altes, MD [facility] Office  [phone] Pager - Text Page per [name]  If 7PM-7AM, please contact night-coverage per Amion 03/06/2022, 11:14 AM

## 2022-03-07 ENCOUNTER — Inpatient Hospital Stay (HOSPITAL_COMMUNITY): Payer: 59

## 2022-03-07 ENCOUNTER — Encounter (HOSPITAL_COMMUNITY): Payer: Self-pay | Admitting: Gastroenterology

## 2022-03-07 DIAGNOSIS — K626 Ulcer of anus and rectum: Secondary | ICD-10-CM | POA: Diagnosis not present

## 2022-03-07 DIAGNOSIS — K625 Hemorrhage of anus and rectum: Secondary | ICD-10-CM | POA: Diagnosis not present

## 2022-03-07 DIAGNOSIS — K50114 Crohn's disease of large intestine with abscess: Secondary | ICD-10-CM | POA: Diagnosis not present

## 2022-03-07 LAB — COMPREHENSIVE METABOLIC PANEL
ALT: 19 U/L (ref 0–44)
AST: 22 U/L (ref 15–41)
Albumin: 2.2 g/dL — ABNORMAL LOW (ref 3.5–5.0)
Alkaline Phosphatase: 105 U/L (ref 38–126)
Anion gap: 6 (ref 5–15)
BUN: 13 mg/dL (ref 8–23)
CO2: 30 mmol/L (ref 22–32)
Calcium: 8.6 mg/dL — ABNORMAL LOW (ref 8.9–10.3)
Chloride: 101 mmol/L (ref 98–111)
Creatinine, Ser: 0.44 mg/dL — ABNORMAL LOW (ref 0.61–1.24)
GFR, Estimated: >60 mL/min (ref >60)
Glucose, Bld: 228 mg/dL — ABNORMAL HIGH (ref 70–99)
Potassium: 3.8 mmol/L (ref 3.5–5.1)
Sodium: 137 mmol/L (ref 135–145)
Total Bilirubin: 0.2 mg/dL — ABNORMAL LOW (ref 0.3–1.2)
Total Protein: 5.6 g/dL — ABNORMAL LOW (ref 6.5–8.1)

## 2022-03-07 LAB — GLUCOSE, CAPILLARY
Glucose-Capillary: 267 mg/dL — ABNORMAL HIGH (ref 70–99)
Glucose-Capillary: 206 mg/dL — ABNORMAL HIGH (ref 70–99)
Glucose-Capillary: 298 mg/dL — ABNORMAL HIGH (ref 70–99)
Glucose-Capillary: 274 mg/dL — ABNORMAL HIGH (ref 70–99)

## 2022-03-07 LAB — MAGNESIUM: Magnesium: 1.6 mg/dL — ABNORMAL LOW (ref 1.7–2.4)

## 2022-03-07 LAB — CBC
HCT: 25.3 % — ABNORMAL LOW (ref 39.0–52.0)
Hemoglobin: 8.2 g/dL — ABNORMAL LOW (ref 13.0–17.0)
MCH: 31.9 pg (ref 26.0–34.0)
MCHC: 32.4 g/dL (ref 30.0–36.0)
MCV: 98.4 fL (ref 80.0–100.0)
Platelets: 323 10*3/uL (ref 150–400)
RBC: 2.57 MIL/uL — ABNORMAL LOW (ref 4.22–5.81)
RDW: 16.7 % — ABNORMAL HIGH (ref 11.5–15.5)
WBC: 9.5 10*3/uL (ref 4.0–10.5)
nRBC: 0 % (ref 0.0–0.2)

## 2022-03-07 MED ORDER — MAGNESIUM SULFATE 2 GM/50ML IV SOLN
2.0000 g | Freq: Once | INTRAVENOUS | Status: AC
  Administered 2022-03-07: 2 g via INTRAVENOUS
  Filled 2022-03-07: qty 50

## 2022-03-07 MED ORDER — METRONIDAZOLE 500 MG PO TABS
500.0000 mg | ORAL_TABLET | Freq: Two times a day (BID) | ORAL | Status: DC
  Administered 2022-03-07 – 2022-03-08 (×3): 500 mg via ORAL
  Filled 2022-03-07 (×3): qty 1

## 2022-03-07 MED ORDER — CEFADROXIL 500 MG PO CAPS
1000.0000 mg | ORAL_CAPSULE | Freq: Two times a day (BID) | ORAL | Status: DC
  Administered 2022-03-07 – 2022-03-08 (×3): 1000 mg via ORAL
  Filled 2022-03-07 (×3): qty 2

## 2022-03-07 MED ORDER — PREDNISONE 20 MG PO TABS
40.0000 mg | ORAL_TABLET | Freq: Every day | ORAL | Status: DC
  Administered 2022-03-08: 40 mg via ORAL
  Filled 2022-03-07: qty 2

## 2022-03-07 MED ORDER — AMOXICILLIN-POT CLAVULANATE 875-125 MG PO TABS: 1.0000 | ORAL_TABLET | Freq: Two times a day (BID) | ORAL | Status: DC

## 2022-03-07 NOTE — Progress Notes (Signed)
[facility] ROUNDING NOTE   Subjective: No acute events overnight.  Received first dose of Remicade yesterday without issue.  Also received first dose of scfa enemas yesterday.  No further rectal bleeding.  H/H stable.  Diet was advanced and he feels much better about that.  Still with elevated BG, attributed to steroids.  Otherwise WBC normal, no fever, chills.  Albumin 2.2.  Objective: Vital signs in last 24 hours: Temp:  [98.5 F (36.9 C)-99 F (37.2 C)] 98.7 F (37.1 C) (12/21 0938) Pulse Rate:  [89-111] 94 (12/21 0938) Resp:  [17-18] 17 (12/21 0938) BP: (109-131)/(63-84) 109/68 (12/21 0938) SpO2:  [94 %-98 %] 94 % (12/21 0938) Last BM Date : 03/06/22 General: NAD   Intake/Output from previous day: 12/20 0701 - 12/21 0700 In: 200 [IV Piggyback:200] Out: 3000 [Urine:3000] Intake/Output this shift: Total I/O In: 240 [P.O.:240] Out: -    Lab Results: Recent Labs    03/05/22 1350 03/06/22 0202 03/07/22 0506  WBC 17.6* 10.5 9.5  HGB 10.0* 8.2* 8.2*  PLT 374 332 323  MCV 97.4 99.2 98.4   BMET Recent Labs    03/04/22 1433 03/06/22 0202 03/07/22 0506  NA 138 138 137  K 4.0 3.8 3.8  CL 98 102 101  CO2  --  29 30  GLUCOSE 307* 214* 228*  BUN '17 14 13  '$ CREATININE 0.40* 0.37* 0.44*  CALCIUM  --  8.3* 8.6*   LFT Recent Labs    03/06/22 0202 03/07/22 0506  PROT 5.4* 5.6*  ALBUMIN 2.1* 2.2*  AST 23 22  ALT 19 19  ALKPHOS 99 105  BILITOT 0.4 0.2*  BILIDIR <0.1  --   IBILI NOT CALCULATED  --    PT/INR No results for input(s): "INR" in the last 72 hours.    Imaging/Other results: DG Chest Port 1 View  Result Date: 03/07/2022 CLINICAL DATA:  Pleural effusion EXAM: PORTABLE CHEST 1 VIEW COMPARISON:  Portable exam 0549 hours compared to 03/03/2022 FINDINGS: RIGHT arm PICC line tip projects over SVC. Enlargement of cardiac silhouette. Stable mediastinal contours for degree of rotation. Bibasilar effusions and atelectasis. Upper lungs clear. No  definite acute infiltrate, pneumothorax or acute osseous findings. IMPRESSION: Persistent bibasilar pleural effusions and atelectasis. Electronically Signed   By: Lavonia Dana M.D.   On: 03/07/2022 08:22   IR Sinus/Fist Tube Chk-Non GI  Result Date: 03/06/2022 INDICATION: Drain check EXAM: Drain check and injection using fluoroscopy MEDICATIONS: Refer to EMR ANESTHESIA/SEDATION: None COMPLICATIONS: None immediate. PROCEDURE: Informed written consent was obtained from the patient after a thorough discussion of the procedural risks, benefits and alternatives. All questions were addressed. Maximal Sterile Barrier Technique was utilized including caps, mask, sterile gowns, sterile gloves, sterile drape, hand hygiene and skin antiseptic. A timeout was performed prior to the initiation of the procedure. The patient was placed supine on the exam table. The left lower quadrant drainage catheter was accessed in a sterile technique. Injection of contrast material demonstrated a decompressed cavity. No communication with the adjacent bowel was identified. The decision was made to proceed with drain removal. Locking loop was released, and the drainage catheter was removed without issue. A clean dressing was placed. IMPRESSION: Left lower quadrant drainage catheter injection demonstrated a decompressed cavity without fistula. Drainage catheter successfully removed. Electronically Signed   By: Albin Felling M.D.   On: 03/06/2022 10:21      Assessment and Plan:  1) Crohn's Disease 2) Sigmoid perforation 3) Rectal ulcers Newly diagnosed severe, penetrating, advanced  phenotypic colonic Crohn's Disease, diagnosed in 01/2022.   - Admitted with sigmoid perforation, requiring emergent surgery on 02/07/2022 with descending/sigmoid colectomy, transverse colostomy. -12/11 sigmoidoscopy performed emergently and notable for deep cratered rectal ulcers with bleeding stigmata, treated with PuraSTAT gel -02/26/22 IR drainage  large LLQ abscess, grew E coli, bacteroides. Flagyl since 12/13, Rocephin since 12/15.  Drain interrogated (no fistula, collapsed/resolved abscess) and removed on 12/20.   -12/18 repeat flexible sigmoidoscopy for recurrent bleed.  Cratered ulcers, but overall improved from the previous sigmoidoscopy.  Again treated any stigmata with PuraSTAT gel with resolution -12/20 started Remicade 12/20 along with scfa enemas for possible overlapping diversion colitis.  Recommendations: - Change IV Solu-Medrol to prednisone 40 mg/day.  Plan to continue x 2 weeks, then taper - Remicade given on 12/20.  Dose #2 in 2 weeks - Continue short-chain fatty acid enemas daily - Continue Flagyl for the time being - Will need hepatitis B vaccine series later on this admission or could be done as outpatient - Tolerating full diet  4) Diabetes Elevated BG 2/2 steroids.  Can hopefully see some degree of improvement when changing from Solu-Medrol to prednisone - Management per primary Hospitalist service  5) Pleural effusion - Management per consulting Cardiology service  GI service will continue to follow   Lavena Bullion, DO  03/07/2022, 10:36 AM [facility] Pager [phone]

## 2022-03-07 NOTE — Progress Notes (Signed)
Progress Note     Subjective: Pt denies abdominal pain or n/v. Having ostomy output. Denies further rectal bleeding overnight. IR drain removed yesterday. Wife at bedside.   Objective: Vital signs in last 24 hours: Temp:  [98.2 F (36.8 C)-99 F (37.2 C)] 98.5 F (36.9 C) (12/21 0437) Pulse Rate:  [89-115] 89 (12/21 0437) Resp:  [17-18] 17 (12/21 0437) BP: (111-131)/(63-84) 116/82 (12/21 0437) SpO2:  [93 %-98 %] 95 % (12/21 0437) Last BM Date : 03/06/22  Intake/Output from previous day: 12/20 0701 - 12/21 0700 In: 200 [IV Piggyback:200] Out: 3000 [Urine:3000] Intake/Output this shift: No intake/output data recorded.  PE: General: pleasant, WD, obese male who is laying in bed in NAD Heart: regular, rate, and rhythm.   Lungs: Respiratory effort nonlabored on [place] Abd: soft, NT, mild distention, ostomy productive, midline wound with small amount of fibrinous exudate centrally    Lab Results:  Recent Labs    03/06/22 0202 03/07/22 0506  WBC 10.5 9.5  HGB 8.2* 8.2*  HCT 25.6* 25.3*  PLT 332 323    BMET Recent Labs    03/06/22 0202 03/07/22 0506  NA 138 137  K 3.8 3.8  CL 102 101  CO2 29 30  GLUCOSE 214* 228*  BUN 14 13  CREATININE 0.37* 0.44*  CALCIUM 8.3* 8.6*    PT/INR No results for input(s): "LABPROT", "INR" in the last 72 hours. CMP     Component Value Date/Time   NA 137 03/07/2022 0506   K 3.8 03/07/2022 0506   CL 101 03/07/2022 0506   CO2 30 03/07/2022 0506   GLUCOSE 228 (H) 03/07/2022 0506   BUN 13 03/07/2022 0506   CREATININE 0.44 (L) 03/07/2022 0506   CREATININE 0.63 (L) 03/10/2019 1436   CALCIUM 8.6 (L) 03/07/2022 0506   PROT 5.6 (L) 03/07/2022 0506   ALBUMIN 2.2 (L) 03/07/2022 0506   AST 22 03/07/2022 0506   ALT 19 03/07/2022 0506   ALKPHOS 105 03/07/2022 0506   BILITOT 0.2 (L) 03/07/2022 0506   GFRNONAA >60 03/07/2022 0506   Lipase  No results found for: "LIPASE"     Studies/Results: DG Chest Port 1 View  Result Date:  03/07/2022 CLINICAL DATA:  Pleural effusion EXAM: PORTABLE CHEST 1 VIEW COMPARISON:  Portable exam 0549 hours compared to 03/03/2022 FINDINGS: RIGHT arm PICC line tip projects over SVC. Enlargement of cardiac silhouette. Stable mediastinal contours for degree of rotation. Bibasilar effusions and atelectasis. Upper lungs clear. No definite acute infiltrate, pneumothorax or acute osseous findings. IMPRESSION: Persistent bibasilar pleural effusions and atelectasis. Electronically Signed   By: Lavonia Dana M.D.   On: 03/07/2022 08:22   IR Sinus/Fist Tube Chk-Non GI  Result Date: 03/06/2022 INDICATION: Drain check EXAM: Drain check and injection using fluoroscopy MEDICATIONS: Refer to EMR ANESTHESIA/SEDATION: None COMPLICATIONS: None immediate. PROCEDURE: Informed written consent was obtained from the patient after a thorough discussion of the procedural risks, benefits and alternatives. All questions were addressed. Maximal Sterile Barrier Technique was utilized including caps, mask, sterile gowns, sterile gloves, sterile drape, hand hygiene and skin antiseptic. A timeout was performed prior to the initiation of the procedure. The patient was placed supine on the exam table. The left lower quadrant drainage catheter was accessed in a sterile technique. Injection of contrast material demonstrated a decompressed cavity. No communication with the adjacent bowel was identified. The decision was made to proceed with drain removal. Locking loop was released, and the drainage catheter was removed without issue. A clean dressing  was placed. IMPRESSION: Left lower quadrant drainage catheter injection demonstrated a decompressed cavity without fistula. Drainage catheter successfully removed. Electronically Signed   By: Albin Felling M.D.   On: 03/06/2022 10:21   ECHOCARDIOGRAM COMPLETE  Result Date: 03/05/2022    ECHOCARDIOGRAM REPORT   Patient Name:   Richard [name] Date of Exam: 03/05/2022 Medical Rec #:  [id number]       Height:       69.0 in Accession #:    [id number]     Weight:       264.3 lb Date of Birth:  01/09/1956      BSA:          2.326 m Patient Age:    66 years       BP:           118/77 mmHg Patient Gender: M              HR:           115 bpm. Exam Location:  Inpatient Procedure: 2D Echo, Cardiac Doppler and Color Doppler Indications:    CHF  History:        Patient has no prior history of Echocardiogram examinations.                 Risk Factors:Diabetes, Sleep Apnea, Dyslipidemia and                 Hypertension.  Sonographer:    Clayton Lefort RDCS (AE) Referring Phys: [id number] [facility]  Sonographer Comments: Limited patient mobility. IMPRESSIONS  1. Left ventricular ejection fraction, by estimation, is 60 to 65%. The left ventricle has normal function. The left ventricle has no regional wall motion abnormalities. Indeterminate diastolic filling due to E-A fusion.  2. Right ventricular systolic function is normal. The right ventricular size is normal. Tricuspid regurgitation signal is inadequate for assessing PA pressure.  3. The mitral valve is grossly normal. Trivial mitral valve regurgitation. No evidence of mitral stenosis.  4. The aortic valve is tricuspid. Aortic valve regurgitation is not visualized. No aortic stenosis is present.  5. The inferior vena cava is normal in size with greater than 50% respiratory variability, suggesting right atrial pressure of 3 mmHg. FINDINGS  Left Ventricle: Left ventricular ejection fraction, by estimation, is 60 to 65%. The left ventricle has normal function. The left ventricle has no regional wall motion abnormalities. The left ventricular internal cavity size was normal in size. There is  no left ventricular hypertrophy. Indeterminate diastolic filling due to E-A fusion. Right Ventricle: The right ventricular size is normal. No increase in right ventricular wall thickness. Right ventricular systolic function is normal. Tricuspid regurgitation signal is  inadequate for assessing PA pressure. Left Atrium: Left atrial size was normal in size. Right Atrium: Right atrial size was normal in size. Pericardium: Trivial pericardial effusion is present. Presence of epicardial fat layer. Mitral Valve: The mitral valve is grossly normal. Trivial mitral valve regurgitation. No evidence of mitral valve stenosis. Tricuspid Valve: The tricuspid valve is grossly normal. Tricuspid valve regurgitation is not demonstrated. No evidence of tricuspid stenosis. Aortic Valve: The aortic valve is tricuspid. Aortic valve regurgitation is not visualized. No aortic stenosis is present. Aortic valve mean gradient measures 4.0 mmHg. Aortic valve peak gradient measures 8.4 mmHg. Aortic valve area, by VTI measures 2.98 cm. Pulmonic Valve: The pulmonic valve was grossly normal. Pulmonic valve regurgitation is not visualized. No evidence of pulmonic stenosis. Aorta: The aortic root and ascending  aorta are structurally normal, with no evidence of dilitation. Venous: The inferior vena cava is normal in size with greater than 50% respiratory variability, suggesting right atrial pressure of 3 mmHg. IAS/Shunts: The atrial septum is grossly normal.  LEFT VENTRICLE PLAX 2D LVIDd:         4.20 cm LVIDs:         2.60 cm LV PW:         1.30 cm LV IVS:        1.10 cm LVOT diam:     2.20 cm LV SV:         59 LV SV Index:   25 LVOT Area:     3.80 cm  RIGHT VENTRICLE RV Basal diam:  2.80 cm RV S prime:     19.00 cm/s TAPSE (M-mode): 2.0 cm LEFT ATRIUM             Index        RIGHT ATRIUM           Index LA diam:        2.70 cm 1.16 cm/m   RA Area:     13.60 cm LA Vol (A2C):   23.7 ml 10.19 ml/m  RA Volume:   31.10 ml  13.37 ml/m LA Vol (A4C):   25.2 ml 10.83 ml/m LA Biplane Vol: 25.4 ml 10.92 ml/m  AORTIC VALVE AV Area (Vmax):    3.01 cm AV Area (Vmean):   3.03 cm AV Area (VTI):     2.98 cm AV Vmax:           145.00 cm/s AV Vmean:          94.300 cm/s AV VTI:            0.198 m AV Peak Grad:      8.4  mmHg AV Mean Grad:      4.0 mmHg LVOT Vmax:         115.00 cm/s LVOT Vmean:        75.200 cm/s LVOT VTI:          0.155 m LVOT/AV VTI ratio: 0.78  AORTA Ao Root diam: 3.80 cm Ao Asc diam:  3.90 cm  SHUNTS Systemic VTI:  0.16 m Systemic Diam: 2.20 cm Eleonore Chiquito MD Electronically signed by Eleonore Chiquito MD Signature Date/Time: 03/05/2022/9:58:22 AM    Final     Anti-infectives: Anti-infectives (From admission, onward)    Start     Dose/Rate Route Frequency Ordered Stop   03/05/22 2000  metroNIDAZOLE (FLAGYL) IVPB 500 mg        500 mg 100 mL/hr over 60 Minutes Intravenous Every 12 hours 03/05/22 0705     03/05/22 1800  cefTRIAXone (ROCEPHIN) 2 g in sodium chloride 0.9 % 100 mL IVPB        2 g 200 mL/hr over 30 Minutes Intravenous Every 24 hours 03/05/22 0705     03/05/22 0800  cefTRIAXone (ROCEPHIN) injection 2 g  Status:  Discontinued        2 g Intramuscular Every 24 hours 03/05/22 0705 03/05/22 0716        Assessment/Plan   Sigmoid perforation  POD28 S/P exploratory laparotomy, colon resection, end colostomy 02/07/22 Dr. [name] - having ostomy output, continue fiber and iron   - CT 12/1 with some free fluid but no organized collection, CT 12/5 with no major changes - BRBPR 12/11 - s/p flex-sig which showed bleeding ulcerations. CTA also showed LLQ abscess - s/p IR  drain placement and paracentesis 12/12 - Cxs with E.coli, abx narrowed to rocephin/flagyl - had recurrent rectal bleeding 12/18 - s/p flex sig with no active bleeding but ulcerations still present  - hgb 8.2 this AM, stable - rectal findings may be more related to exclusion proctitis rather than Crohn's given ulcerations despite steroids - discussed with GI and agree on trial of small volume enemas with SCFA. Pt tolerated this well yesterday - steroids for acute Crohn's initiated 12/13 - CT 12/18 with resolution in LLQ collection and decrease in small volume ascites - IR drain study 12/20 did not demonstrate fistula, drain  removed  - GI initiated remicade yesterday, plan repeat dosing in 2 weeks  - recommend holding off on colonoscopy currently to avoid putting extra tension on stoma acutely  - no other acute surgical issues, we will sign off at this time but please let us know if any concerns or issues arise    FEN: CM diet; cont iron and vit c VTE: LMWH on hold  ID: vanc/flagyl/cefepime 11/23; zosyn 11/24>11/29; vanc 12/1>12/4; merrem 12/1> 12/5; Zosyn 12/11>12/15; Rocephin/flagyl 12/15>>   - below per primary -  Post-operative respiratory insufficiency - concern for possible CHF, cardiology was consulting and planning echo today  T2DM Physical deconditioning   HTN HLD Obesity class I OSA Hypogonadism Moderate protein calorie malnutrition  ADHD/Anxiety     LOS: 2 days     Norm Parcel, [facility] Surgery 03/07/2022, 9:14 AM Please see Amion for pager number during day hours 7:00am-4:30pm

## 2022-03-07 NOTE — Progress Notes (Signed)
IP rehab admissions - We do have authorization for acute inpatient rehab admission.  Today I do not have a bed available for this patient on CIR.  I will have my partner follow up in am for bed availability and medical readiness for CIR.  Call for questions.  [phone]

## 2022-03-07 NOTE — Progress Notes (Signed)
Richard [name]  ZOX:[id number] DOB: 09-Mar-1956 DOA: 03/05/2022 PCP: [name], MD    Brief Narrative:  66 year old with a history of DM2, HTN, HLD, OSA, Crohn's disease, and perforated sigmoid colon status post resection with transverse colostomy 81/19/1478 complicated by intra-abdominal abscess who presented to the ER with large-volume hematochezia.  He had been discharged to the inpatient rehab unit 03/01/2022 after a preceding admission during which sigmoidoscopy revealed numerous rectal ulcers which were treated with hemostatic gel.  While in rehab he suffered the acute return of BRBPR but no bleeding from his ostomy.  Consultants:  Gastroenterology  Goals of Care:  Code Status: Full Code   DVT prophylaxis: SCDs  Interim Hx: No acute events reported overnight.  Afebrile.  Vital signs stable.  Hemoglobin holding steady.  Assessment & Plan:  Acute lower GI bleeding/large-volume hematochezia Underwent flexible sigmoidoscopy 12/18 noting multiple rectal ulcers which were treated with hemostatic gel - appears to have stabilized for now  Recent intra-abdominal abscess Underwent percutaneous drain placement in IR 02/26/2022 -cultures grew E. coli and Bacteroides species -continued Rocephin and Flagyl as per previous plan initially - follow-up imaging of drain 12/20 noted resolution of abscess cavity therefore drain removed - to complete 5 additional days of oral abx then can d/c abx for this indication   Hypoxia -pleural effusions Possible pleural effusions noted on CXR 12/17 - f/u CXR 12/21 notes persistent effusions - saturatons >90% on RA - suspect these are simply due to low oncotic state/albumin 2.2 - consider albumin infusion for sx improvement if becomes acutely sob, but no need presently   Hypomagnesemia Due to poor intake and increased GI loss - supplement and follow   Crohn's disease On chronic steroid therapy - Remicade has been dosed with repeat dosing planned in 2  weeks -Solu-Medrol transitioned to prednisone with plan to transition to off - short-chain fatty acid enema dosed daily starting 03/06/22  DM2 A1c 6.15 February 2022 -insulin adjusted in setting of ongoing steroid therapy with CBG improving - will improve w/ weaning of steroids   ?CHF - clinically ruled out  TTE noted EF 60-65% with no LVH and no significant valvular disease but unable to comment on diastolic function -suspect this is simply due to low albumin state and not indicative of true CHF  Physical debility Will require return to CIR once medical issues stabilized   Family Communication: Spoke with wife at bedside Disposition: From CIR - anticipate return to CIR 12/22   Objective: Blood pressure 109/68, pulse 94, temperature 98.7 F (37.1 C), temperature source Oral, resp. rate 17, SpO2 94 %.  Intake/Output Summary (Last 24 hours) at 03/07/2022 0957 Last data filed at 03/07/2022 2956 Gross per 24 hour  Intake 440 ml  Output 3000 ml  Net -2560 ml    There were no vitals filed for this visit.  Examination: General: No acute respiratory distress Lungs: Clear to auscultation bilaterally  Cardiovascular: Regular rate and rhythm without murmur  Abdomen: Nontender, nondistended, soft, bowel sounds positive, no rebound Extremities: 1+ B LE edema   CBC: Recent Labs  Lab 03/01/22 0447 03/03/22 1000 03/04/22 0425 03/04/22 1433 03/05/22 1350 03/06/22 0202 03/07/22 0506  WBC 7.3 13.4* 10.4   < > 17.6* 10.5 9.5  NEUTROABS 5.7 11.4* 8.0*  --   --   --   --   HGB 8.1* 10.4* 9.4*   < > 10.0* 8.2* 8.2*  HCT 25.9* 32.7* 29.2*   < > 30.4* 25.6* 25.3*  MCV  99.6 97.6 98.0   < > 97.4 99.2 98.4  PLT 262 359 325   < > 374 332 323   < > = values in this interval not displayed.    Basic Metabolic Panel: Recent Labs  Lab 03/01/22 0447 03/03/22 1842 03/04/22 0425 03/04/22 1433 03/06/22 0202 03/07/22 0506  NA 142   < > 136 138 138 137  K 4.0   < > 3.9 4.0 3.8 3.8  CL 103    < > 98 98 102 101  CO2 30   < > 30  --  29 30  GLUCOSE 252*   < > 249* 307* 214* 228*  BUN 16   < > '21 17 14 13  '$ CREATININE 0.35*   < > 0.45* 0.40* 0.37* 0.44*  CALCIUM 8.0*   < > 8.7*  --  8.3* 8.6*  MG 1.8  --   --   --  1.3*  --   PHOS 2.3*  --   --   --   --   --    < > = values in this interval not displayed.    GFR: Estimated Creatinine Clearance: 117.7 mL/min (A) (by C-G formula based on SCr of 0.44 mg/dL (L)).   Scheduled Meds:  atorvastatin  40 mg Oral Daily   Chlorhexidine Gluconate Cloth  6 each Topical Q0600   insulin aspart  0-15 Units Subcutaneous TID WC   insulin aspart  0-5 Units Subcutaneous QHS   insulin aspart  6 Units Subcutaneous TID WC   insulin detemir  14 Units Subcutaneous QHS   lip balm  1 Application Topical BID   methylPREDNISolone (SOLU-MEDROL) injection  40 mg Intravenous Daily   pantoprazole  40 mg Oral Daily   Short Chain Fatty Acid Enema  1 enema Rectal Daily   sodium chloride flush  10-40 mL Intracatheter Q12H   sodium chloride flush  3 mL Intravenous Q12H   thiamine  100 mg Oral Daily   zinc sulfate  220 mg Oral Daily   Continuous Infusions:  cefTRIAXone (ROCEPHIN)  IV Stopped (03/06/22 1822)   metroNIDAZOLE 500 mg (03/06/22 2151)     LOS: 2 days   Cherene Altes, MD [facility] Office  [phone] Pager - Text Page per [name]  If 7PM-7AM, please contact night-coverage per Amion 03/07/2022, 9:57 AM

## 2022-03-07 NOTE — H&P (Incomplete)
Physical Medicine and Rehabilitation Admission H&P    CC: Debility due to multiple medical issues   HPI: Richard [name] is a 66 year old male with history of T2DM, severe OSA, ADHD, HTN, scrotal abscess s/p I&D 08/23, recent diagnosis of IBD who was admitted to [facility] on 02/07/22 with perforated bowel with peritonitis requiring removal emergent exploratory lap with mobilization splenic flexure, resection of descending and sigmoid colon with transverse colostomy by Dr. [name].  Hospital course was significant for ileus, encephalopathy due to PNA, abdominal distention with nausea requiring TNA as well as issues with fluid overload.  He developed anemia with rectal bleeding due to rectal varices with multiple ulcers in the mid and distal rectum as well as repeat grade mid rectal ulcer that was treated with hemostat spray.  He was also found to have LLQ abscess positive for E coli and bacteroides species that was treated with drain placement and initiation of IV Zosyn and Flagyl per Dr. Alcario Drought input.    PT OT was working with patient and CIR was recommended due to functional decline.  He was admitted to rehab on 03/01/2022 and continued to have issues with fluid overload as well as purulent drainage from his LLQ drain.  Cardiology was consulted and was started on IV diuresis.  He  had recurrent rectal bleeding due to multiple ulcers in distal colon tx with PuraSTAT gel by Dr. Ellis Savage. CBC showed stable H/H and colon mucosa has significantly improved on flex sig per GI. He had recurrent rectal bleeding that night and was transferred to acute hospital for closer monitoring.  He was started on trial of short chain fatty enema daily and Remicade administered 12/20 after discussion of risks v/s benefits. 2D echo showed 12/19 showed EF 60-65% with  no wall abnormality and trivial pericardial effusion. CT abdomen pelvis showed resolution of abscess therefore drain pulled 12/20  and he was transitioned to  [facility] and Flagyl thru 12/26. Cardiology has been following for IV diuresis and has signed off. Hypomagnesemia has been due to poor intake/GI losses has been supplemented. He was transitioned from solumedrol to prednisone today with plans for 20 mg X 2 weeks followed by taper of 5 mg/wk. Rectal bleeding has resolved. Po intake has been good and he has been weaned off daytime oxygen.  PT/OT evaluations completed and CIR recommended due to ongoing issues with weakness and debility.     Review of Systems  Constitutional:  Negative for fever.  HENT:  Negative for hearing loss.   Eyes:  Negative for blurred vision.  Respiratory:  Negative for shortness of breath.   Cardiovascular:  Negative for chest pain and palpitations.  Gastrointestinal:  Positive for abdominal pain. Negative for heartburn and nausea.  Genitourinary:  Negative for dysuria and urgency.  Skin:  Negative for rash.  Neurological:  Positive for weakness. Negative for dizziness and headaches.  Psychiatric/Behavioral:  The patient has insomnia.      Past Medical History:  Diagnosis Date   Adult ADHD    Allergic rhinitis    Anxiety    claustrophobic   Bronchopneumonia 11/03/2013   Colon polyps 2012 and 2013   All hyperplastic except one adenomatous w/out high grade dysplasia (recall 2023 per Dr. Deatra Ina)   COVID-19 virus infection 09/29/2020   Elevated transaminase level 2017   Viral hep screening neg.  Suspect fatty liver.   Hyperlipidemia    Hypertension    Inflammatory bowel disease    01/2022   Large bowel  perforation ([place]) 02/2022   delayed, s/p colonoscopy w/biopsies (new dx crohn's/inflammatory BD)   Obesity    OSA on CPAP    Severe.  CPAP 10 cm H2O.  Follows Dr. Ander Slade.   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 [name] to do surg   Pneumonia    Seborrheic dermatitis of scalp    and face: hydrocort 2% cream bid prn to face, and fluocinonide 0.5% sol'n to scalp bid prn per Dr. 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 Microalb 2020    Past Surgical History:  Procedure Laterality Date   COLONOSCOPY W/ POLYPECTOMY  04/2010; 10/2011   Initial screening TCS showed 6 polyps (one adenomatous).  Pt says he returned for repeat TCS 10/2011 showed one hyperplastic polyp: Recall 10 yrs per Dr. Samuella Cota SIGMOIDOSCOPY N/A 02/25/2022   Procedure: FLEXIBLE SIGMOIDOSCOPY;  Surgeon: Irving Copas., MD;  Location: [facility];  Service: Gastroenterology;  Laterality: N/A;   FLEXIBLE SIGMOIDOSCOPY N/A 03/04/2022   Procedure: FLEXIBLE SIGMOIDOSCOPY;  Surgeon: Lavena Bullion, DO;  Location: [facility];  Service: Gastroenterology;  Laterality: N/A;   HEMOSTASIS CONTROL  02/25/2022   Procedure: HEMOSTASIS CONTROL;  Surgeon: Irving Copas., MD;  Location: [facility];  Service: Gastroenterology;;   HEMOSTASIS CONTROL  03/04/2022   Procedure: HEMOSTASIS CONTROL;  Surgeon: Lavena Bullion, DO;  Location: [facility];  Service: Gastroenterology;;   INCISION AND DRAINAGE ABSCESS N/A 11/07/2021   Procedure: Incision and drainage of scrotal abscess;  Surgeon: Leighton Ruff, MD;  Location: [facility];  Service: General;  Laterality: N/A;   IR SINUS/FIST TUBE CHK-NON GI  03/06/2022   LAPAROTOMY N/A 02/07/2022   Procedure: EXPLORATORY LAPAROTOMY, COLON RESECTION, OSTOMY;  Surgeon: Leighton Ruff, MD;  Location: [facility];  Service: General;  Laterality: N/A;   MOUTH SURGERY     TRANSTHORACIC ECHOCARDIOGRAM     03/05/22 NORMAL    Family History  Problem Relation Age of Onset   Lung cancer Father    Cancer Father        nasal   Allergies Father    Colon cancer Neg Hx    Esophageal cancer Neg Hx    Rectal cancer Neg Hx    Stomach cancer Neg Hx     Social History:  reports that he quit smoking about 19 years ago. His smoking use included cigarettes. He smoked an average of 1 pack per day. He  has never used smokeless tobacco. He reports current alcohol use of about 7.0 standard drinks of alcohol per week. He reports that he does not use drugs.   Allergies  Allergen Reactions   Lobster [Shellfish Allergy] Nausea Only   Poison Ivy Extract Itching    Medications Prior to Admission  Medication Sig Dispense Refill   Accu-Chek Softclix Lancets lancets SMARTSIG:Topical     acetaminophen (TYLENOL) 325 MG tablet Take 2 tablets (650 mg total) by mouth every 4 (four) hours as needed for mild pain, moderate pain, fever or headache.     acetaminophen (TYLENOL) 500 MG tablet Take 1,000 mg by mouth every 6 (six) hours as needed for mild pain.     alum & mag hydroxide-simeth (MAALOX/MYLANTA) 200-200-20 MG/5ML suspension Take 30 mLs by mouth every 6 (six) hours as needed for indigestion or heartburn (or bloating). 355 mL 0   ascorbic acid (VITAMIN C) 500 MG tablet Take 1 tablet (500 mg total) by mouth 2 (two) times  daily.     atorvastatin (LIPITOR) 40 MG tablet TAKE 1 TABLET BY MOUTH EVERY DAY 90 tablet 1   cefTRIAXone (ROCEPHIN) 1 g injection Inject 2 g into the muscle daily. 1 each 0   clotrimazole-betamethasone (LOTRISONE) cream Apply 1 Application topically 2 (two) times daily. 30 g 1   diphenhydrAMINE (BENADRYL) 25 mg capsule Take 25 mg by mouth daily as needed for allergies.     diphenoxylate-atropine (LOMOTIL) 2.5-0.025 MG tablet Take 1-2 tablets by mouth 4 (four) times daily as needed for diarrhea or loose stools. 1-2 tabs po qid prn diarrhea 60 tablet 0   empagliflozin (JARDIANCE) 10 MG TABS tablet TAKE 1 TABLET BY MOUTH EVERY DAY BEFORE BREAKFAST (Patient taking differently: Take 10 mg by mouth daily.) 90 tablet 0   feeding supplement (ENSURE ENLIVE / ENSURE PLUS) LIQD Take 237 mLs by mouth 3 (three) times daily between meals. 237 mL 12   ferrous sulfate 325 (65 FE) MG tablet Take 1 tablet (325 mg total) by mouth 2 (two) times daily before a meal.  3   glucose blood (ACCU-CHEK GUIDE)  test strip USE TO TEST BLOOD SUGAR TWICE DAILY 200 strip 0   hydrOXYzine (ATARAX) 25 MG tablet Take 1 tablet (25 mg total) by mouth 3 (three) times daily as needed for itching. 30 tablet 0   insulin aspart (NOVOLOG) 100 UNIT/ML injection Inject 0-15 Units into the skin 3 (three) times daily with meals. 10 mL 11   insulin aspart (NOVOLOG) 100 UNIT/ML injection Inject 0-5 Units into the skin at bedtime. 10 mL 11   lisinopril (ZESTRIL) 10 MG tablet TAKE 1 TABLET DAILY 90 tablet 0   metFORMIN (GLUCOPHAGE) 500 MG tablet TAKE 1 TABLET BY MOUTH TWICE A DAY WITH FOOD 180 tablet 1   methylphenidate (METADATE CD) 20 MG CR capsule Take 1 capsule (20 mg total) by mouth daily as needed. 30 capsule 0   methylPREDNISolone sodium succinate (SOLU-MEDROL) 40 mg/mL injection Inject 1 mL (40 mg total) into the vein daily. 1 each 0   metoCLOPramide (REGLAN) 5 MG tablet Take 1 tablet (5 mg total) by mouth 2 (two) times daily with breakfast and lunch.     metoprolol tartrate (LOPRESSOR) 25 MG tablet Take 1 tablet (25 mg total) by mouth 2 (two) times daily. 60 tablet 11   metroNIDAZOLE (FLAGYL) 500 MG/100ML Inject 100 mLs (500 mg total) into the vein every 12 (twelve) hours.     Multiple Vitamin (MULTIVITAMIN WITH MINERALS) TABS tablet Take 1 tablet by mouth daily.     ondansetron (ZOFRAN) 4 MG/2ML SOLN injection Inject 2 mLs (4 mg total) into the vein every 6 (six) hours as needed for nausea or vomiting. 2 mL 0   pantoprazole (PROTONIX) 40 MG tablet Take 1 tablet (40 mg total) by mouth daily.     polycarbophil (FIBERCON) 625 MG tablet Take 1 tablet (625 mg total) by mouth 2 (two) times daily.     SYRINGE-NEEDLE, DISP, 3 ML 22G X 1-1/2" 3 ML MISC USE TO ADMINISTER TESTOSTERONE INJECTION EVERY 2 WEEKS 50 each 1   Syringe/Needle, Disp, 18G X 1" 3 ML MISC USE TO ADMINISTER TESTOSTERONE INJECTION EVERY 2 WEEKS 50 each 1   testosterone cypionate (DEPOTESTOSTERONE CYPIONATE) 200 MG/ML injection 200 MG (1 ML) SQ Q 2 WEEKS  (Patient not taking: Reported on 02/08/2022) 5 mL 1   thiamine (VITAMIN B-1) 100 MG tablet Take 1 tablet (100 mg total) by mouth daily.       Home: Home  Living Family/patient expects to be discharged to:: Private residence Living Arrangements: Spouse/significant other Available Help at Discharge: Family Type of Home: House Home Access: Stairs to enter Technical brewer of Steps: 5 Entrance Stairs-Rails: Left Home Layout: Two level Alternate Level Stairs-Number of Steps: 15 down to his  office Alternate Level Stairs-Rails: Left Bathroom Shower/Tub: Multimedia programmer: Programmer, systems: Yes Home Equipment: None  Lives With: Spouse   Functional History: Prior Function Prior Level of Function : Independent/Modified Independent 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 ,  Functional Status:  Mobility: Bed Mobility Overal bed mobility: Needs Assistance Bed Mobility: Supine to Sit, Sit to Supine Rolling: Max assist Supine to sit: Mod assist, +2 for physical assistance Sit to supine: Mod assist, +2 for physical assistance General bed mobility comments: Pt requires 2 person moderate assistance to go from supine<>sitting with assistance with bil LE and trunk with HOB elevated and heavy use of bed rails. Transfers Overall transfer level: Needs assistance Equipment used:  Charlaine Dalton) Transfers: Sit to/from Stand Sit to Stand: Mod assist, +2 physical assistance Transfer via Lift Equipment: Stedy General transfer comment: Pt stood at 2 person Mod A with EOB elevated to Stedy standing for ~5 min or greater while resting on steady seat for linen change. Ambulation/Gait General Gait Details: Due to current functional status unable to attempt    ADL: ADL Overall ADL's : Needs assistance/impaired Grooming: Oral care, Wash/dry face, Set up, Bed level Upper Body Bathing: Maximal assistance, Sitting Upper Body Bathing Details (indicate cue type and reason): Due to  multiple lines and leads. Lower Body Bathing: Total assistance, Bed level Upper Body Dressing : Maximal assistance, Sitting Lower Body Dressing: Bed level, Total assistance Toilet Transfer:  (NT) Toileting- Clothing Manipulation and Hygiene: Total assistance, Bed level  Cognition: Cognition Overall Cognitive Status: Within Functional Limits for tasks assessed Orientation Level: Oriented X4 Cognition Arousal/Alertness: Awake/alert Behavior During Therapy: Flat affect, Anxious Overall Cognitive Status: Within Functional Limits for tasks assessed General Comments: Does not like alot of people in and out of his room and one time; it's too much at once.   Blood pressure 109/68, pulse 94, temperature 98.7 F (37.1 C), temperature source Oral, resp. rate 17, SpO2 94 %. Physical Exam Vitals and nursing note reviewed.  Constitutional:      Appearance: Normal appearance.  Abdominal:     General: There is no distension.  Neurological:     Mental Status: He is alert and oriented to person, place, and time.     Results for orders placed or performed during the hospital encounter of 03/05/22 (from the past 48 hour(s))  Glucose, capillary     Status: Abnormal   Collection Time: 03/05/22  9:31 PM  Result Value Ref Range   Glucose-Capillary 320 (H) 70 - 99 mg/dL    Comment: Glucose reference range applies only to samples taken after fasting for at least 8 hours.  Glucose, capillary     Status: Abnormal   Collection Time: 03/06/22 12:39 AM  Result Value Ref Range   Glucose-Capillary 256 (H) 70 - 99 mg/dL    Comment: Glucose reference range applies only to samples taken after fasting for at least 8 hours.  CBC     Status: Abnormal   Collection Time: 03/06/22  2:02 AM  Result Value Ref Range   WBC 10.5 4.0 - 10.5 K/uL   RBC 2.58 (L) 4.22 - 5.81 MIL/uL   Hemoglobin 8.2 (L) 13.0 - 17.0 g/dL   HCT 25.6 (L) 39.0 -  52.0 %   MCV 99.2 80.0 - 100.0 fL   MCH 31.8 26.0 - 34.0 pg   MCHC 32.0 30.0  - 36.0 g/dL   RDW 16.5 (H) 11.5 - 15.5 %   Platelets 332 150 - 400 K/uL   nRBC 0.0 0.0 - 0.2 %    Comment: Performed at [facility] [address] [place], [place] [address]  Magnesium     Status: Abnormal   Collection Time: 03/06/22  2:02 AM  Result Value Ref Range   Magnesium 1.3 (L) 1.7 - 2.4 mg/dL    Comment: Performed at [facility] [address] [place], [place] [address]  Basic metabolic panel     Status: Abnormal   Collection Time: 03/06/22  2:02 AM  Result Value Ref Range   Sodium 138 135 - 145 mmol/L   Potassium 3.8 3.5 - 5.1 mmol/L   Chloride 102 98 - 111 mmol/L   CO2 29 22 - 32 mmol/L   Glucose, Bld 214 (H) 70 - 99 mg/dL    Comment: Glucose reference range applies only to samples taken after fasting for at least 8 hours.   BUN 14 8 - 23 mg/dL   Creatinine, Ser 0.37 (L) 0.61 - 1.24 mg/dL   Calcium 8.3 (L) 8.9 - 10.3 mg/dL   GFR, Estimated >60 >60 mL/min    Comment: (NOTE) Calculated using the CKD-EPI Creatinine Equation (2021)    Anion gap 7 5 - 15    Comment: Performed at [facility] [address], [place], [place] [address]  Hepatic function panel     Status: Abnormal   Collection Time: 03/06/22  2:02 AM  Result Value Ref Range   Total Protein 5.4 (L) 6.5 - 8.1 g/dL   Albumin 2.1 (L) 3.5 - 5.0 g/dL   AST 23 15 - 41 U/L   ALT 19 0 - 44 U/L   Alkaline Phosphatase 99 38 - 126 U/L   Total Bilirubin 0.4 0.3 - 1.2 mg/dL   Bilirubin, Direct <0.1 0.0 - 0.2 mg/dL   Indirect Bilirubin NOT CALCULATED 0.3 - 0.9 mg/dL    Comment: Performed at [facility] [address], [place], [place] [address]  Glucose, capillary     Status: Abnormal   Collection Time: 03/06/22  9:25 AM  Result Value Ref Range   Glucose-Capillary 203 (H) 70 - 99 mg/dL    Comment: Glucose reference range applies only to samples taken after fasting for at least 8 hours.  Glucose, capillary     Status: Abnormal   Collection Time: 03/06/22 12:43 PM  Result Value Ref  Range   Glucose-Capillary 307 (H) 70 - 99 mg/dL    Comment: Glucose reference range applies only to samples taken after fasting for at least 8 hours.  Glucose, capillary     Status: Abnormal   Collection Time: 03/06/22  3:59 PM  Result Value Ref Range   Glucose-Capillary 265 (H) 70 - 99 mg/dL    Comment: Glucose reference range applies only to samples taken after fasting for at least 8 hours.  Glucose, capillary     Status: Abnormal   Collection Time: 03/06/22  8:19 PM  Result Value Ref Range   Glucose-Capillary 345 (H) 70 - 99 mg/dL    Comment: Glucose reference range applies only to samples taken after fasting for at least 8 hours.  Comprehensive metabolic panel     Status: Abnormal   Collection Time: 03/07/22  5:06 AM  Result Value Ref Range   Sodium 137 135 - 145 mmol/L   Potassium 3.8 3.5 - 5.1 mmol/L   Chloride 101 98 - 111 mmol/L   CO2 30 22 - 32 mmol/L   Glucose, Bld 228 (H) 70 - 99 mg/dL    Comment: Glucose reference range applies only to samples taken after fasting for at least 8 hours.   BUN 13 8 - 23 mg/dL   Creatinine, Ser 0.44 (L) 0.61 - 1.24 mg/dL   Calcium 8.6 (L) 8.9 - 10.3 mg/dL   Total Protein 5.6 (L) 6.5 - 8.1 g/dL   Albumin 2.2 (L) 3.5 - 5.0 g/dL   AST 22 15 - 41 U/L   ALT 19 0 - 44 U/L   Alkaline Phosphatase 105 38 - 126 U/L   Total Bilirubin 0.2 (L) 0.3 - 1.2 mg/dL   GFR, Estimated >60 >60 mL/min    Comment: (NOTE) Calculated using the CKD-EPI Creatinine Equation (2021)    Anion gap 6 5 - 15    Comment: Performed at [facility], [place] [address], [place], [place] [address]  CBC     Status: Abnormal   Collection Time: 03/07/22  5:06 AM  Result Value Ref Range   WBC 9.5 4.0 - 10.5 K/uL   RBC 2.57 (L) 4.22 - 5.81 MIL/uL   Hemoglobin 8.2 (L) 13.0 - 17.0 g/dL   HCT 25.3 (L) 39.0 - 52.0 %   MCV 98.4 80.0 - 100.0 fL   MCH 31.9 26.0 - 34.0 pg   MCHC 32.4 30.0 - 36.0 g/dL   RDW 16.7 (H) 11.5 - 15.5 %   Platelets 323 150 - 400 K/uL   nRBC 0.0 0.0 -  0.2 %    Comment: Performed at [facility], [place] [address] [place], [place] [address]  Magnesium     Status: Abnormal   Collection Time: 03/07/22  5:06 AM  Result Value Ref Range   Magnesium 1.6 (L) 1.7 - 2.4 mg/dL    Comment: Performed at [facility] [address] [place], [place] [address]  Glucose, capillary     Status: Abnormal   Collection Time: 03/07/22  9:32 AM  Result Value Ref Range   Glucose-Capillary 267 (H) 70 - 99 mg/dL    Comment: Glucose reference range applies only to samples taken after fasting for at least 8 hours.  Glucose, capillary     Status: Abnormal   Collection Time: 03/07/22 12:24 PM  Result Value Ref Range   Glucose-Capillary 206 (H) 70 - 99 mg/dL    Comment: Glucose reference range applies only to samples taken after fasting for at least 8 hours.   DG Chest Port 1 View  Result Date: 03/07/2022 CLINICAL DATA:  Pleural effusion EXAM: PORTABLE CHEST 1 VIEW COMPARISON:  Portable exam 0549 hours compared to 03/03/2022 FINDINGS: RIGHT arm PICC line tip projects over SVC. Enlargement of cardiac silhouette. Stable mediastinal contours for degree of rotation. Bibasilar effusions and atelectasis. Upper lungs clear. No definite acute infiltrate, pneumothorax or acute osseous findings. IMPRESSION: Persistent bibasilar pleural effusions and atelectasis. Electronically Signed   By: Lavonia Dana M.D.   On: 03/07/2022 08:22   IR Sinus/Fist Tube Chk-Non GI  Result Date: 03/06/2022 INDICATION: Drain check EXAM: Drain check and injection using fluoroscopy MEDICATIONS: Refer to EMR ANESTHESIA/SEDATION: None COMPLICATIONS: None immediate. PROCEDURE: Informed written consent was obtained from the patient after a thorough discussion of the procedural risks, benefits and alternatives.  All questions were addressed. Maximal Sterile Barrier Technique was utilized including caps, mask, sterile gowns, sterile gloves, sterile drape, hand hygiene and skin antiseptic. A timeout  was performed prior to the initiation of the procedure. The patient was placed supine on the exam table. The left lower quadrant drainage catheter was accessed in a sterile technique. Injection of contrast material demonstrated a decompressed cavity. No communication with the adjacent bowel was identified. The decision was made to proceed with drain removal. Locking loop was released, and the drainage catheter was removed without issue. A clean dressing was placed. IMPRESSION: Left lower quadrant drainage catheter injection demonstrated a decompressed cavity without fistula. Drainage catheter successfully removed. Electronically Signed   By: Albin Felling M.D.   On: 03/06/2022 10:21      Blood pressure 109/68, pulse 94, temperature 98.7 F (37.1 C), temperature source Oral, resp. rate 17, SpO2 94 %.  Medical Problem List and Plan: 1. Functional deficits secondary to ***  -patient may *** shower  -ELOS/Goals: *** 2.  Antithrombotics: -DVT/anticoagulation:  Mechanical: Sequential compression devices, below knee Bilateral lower extremities  -antiplatelet therapy: N/A 3. Pain Management: tylenol prn 4. Mood/Behavior/Sleep: LCSW to follow for evaluation and support.   -antipsychotic agents: N/A 5. Neuropsych/cognition: This patient is capable of making decisions on his own behalf. 6. Skin/Wound Care: Wet to dry dressing changes daily to midline.   --continue Zinc. Add vitamin C  7. Fluids/Electrolytes/Nutrition: Monitor I/O. Check CMET in am  -- 8. Sigmoid perforation s/p resection w/abscess: On Duricef and Flagyl thru 12/26 9. Rectal ulcers:  Continue short chain fatty acid edemas daily after supper.  10. Crohn's disease: Remicade given 12/20 and repeat dose in 2 weeks per GI  --prednisone 40 mg X 2 weeks followed by 5 mg/week taper.   11. T2DM: Hgb A1C-6.1 and BS poorly controlled due to steroids/immobility.  --will monitor BS ac/hs and titrate insulin as indicated.  --continue Levemir 14  units with  6 units novolog for meal coverage.  12. Fluid overload/Pleural effusions: Daily wts with monitoring for overload  --intake improved and may need to add Low salt restrictions.  13. Hypomagnesemia: Receive IV supplementation 12/20 and 12/21.   --Mg 1.3-->1.5 and till low. Will add oral supplement. Recheck in am.  14. Severe OSA: Discussed need for CPAP/oxygen does not treat underling issues  --declines CPAP "too much at this time/can't lie on the left" 15.      ***  Bary Leriche, PA-C 03/07/2022

## 2022-03-08 ENCOUNTER — Inpatient Hospital Stay (HOSPITAL_COMMUNITY)
Admission: RE | Admit: 2022-03-08 | Discharge: 2022-03-25 | DRG: 945 | Disposition: A | Discharge status: Home Health | Payer: 59 | Source: Intra-hospital | Attending: Physical Medicine & Rehabilitation | Admitting: Physical Medicine & Rehabilitation

## 2022-03-08 ENCOUNTER — Other Ambulatory Visit: Payer: Self-pay

## 2022-03-08 ENCOUNTER — Encounter (HOSPITAL_COMMUNITY): Payer: Self-pay | Admitting: Physical Medicine & Rehabilitation

## 2022-03-08 DIAGNOSIS — R5381 Other malaise: Secondary | ICD-10-CM | POA: Diagnosis present

## 2022-03-08 DIAGNOSIS — D539 Nutritional anemia, unspecified: Secondary | ICD-10-CM | POA: Diagnosis present

## 2022-03-08 DIAGNOSIS — E1165 Type 2 diabetes mellitus with hyperglycemia: Secondary | ICD-10-CM | POA: Diagnosis not present

## 2022-03-08 DIAGNOSIS — Z87891 Personal history of nicotine dependence: Secondary | ICD-10-CM | POA: Diagnosis not present

## 2022-03-08 DIAGNOSIS — E8770 Fluid overload, unspecified: Secondary | ICD-10-CM | POA: Diagnosis present

## 2022-03-08 DIAGNOSIS — F909 Attention-deficit hyperactivity disorder, unspecified type: Secondary | ICD-10-CM | POA: Diagnosis present

## 2022-03-08 DIAGNOSIS — E785 Hyperlipidemia, unspecified: Secondary | ICD-10-CM | POA: Diagnosis present

## 2022-03-08 DIAGNOSIS — F329 Major depressive disorder, single episode, unspecified: Secondary | ICD-10-CM | POA: Diagnosis not present

## 2022-03-08 DIAGNOSIS — R000 Tachycardia, unspecified: Secondary | ICD-10-CM | POA: Diagnosis not present

## 2022-03-08 DIAGNOSIS — I10 Essential (primary) hypertension: Secondary | ICD-10-CM | POA: Diagnosis present

## 2022-03-08 DIAGNOSIS — Z933 Colostomy status: Secondary | ICD-10-CM

## 2022-03-08 DIAGNOSIS — J90 Pleural effusion, not elsewhere classified: Secondary | ICD-10-CM | POA: Diagnosis present

## 2022-03-08 DIAGNOSIS — K50114 Crohn's disease of large intestine with abscess: Secondary | ICD-10-CM | POA: Diagnosis not present

## 2022-03-08 DIAGNOSIS — G4733 Obstructive sleep apnea (adult) (pediatric): Secondary | ICD-10-CM | POA: Diagnosis present

## 2022-03-08 DIAGNOSIS — Z794 Long term (current) use of insulin: Secondary | ICD-10-CM | POA: Diagnosis not present

## 2022-03-08 DIAGNOSIS — Z79899 Other long term (current) drug therapy: Secondary | ICD-10-CM

## 2022-03-08 DIAGNOSIS — T380X5A Adverse effect of glucocorticoids and synthetic analogues, initial encounter: Secondary | ICD-10-CM | POA: Diagnosis present

## 2022-03-08 DIAGNOSIS — Z91013 Allergy to seafood: Secondary | ICD-10-CM

## 2022-03-08 DIAGNOSIS — K626 Ulcer of anus and rectum: Secondary | ICD-10-CM | POA: Diagnosis present

## 2022-03-08 DIAGNOSIS — K50911 Crohn's disease, unspecified, with rectal bleeding: Secondary | ICD-10-CM | POA: Diagnosis present

## 2022-03-08 DIAGNOSIS — F988 Other specified behavioral and emotional disorders with onset usually occurring in childhood and adolescence: Secondary | ICD-10-CM | POA: Diagnosis present

## 2022-03-08 DIAGNOSIS — L89322 Pressure ulcer of left buttock, stage 2: Secondary | ICD-10-CM | POA: Diagnosis present

## 2022-03-08 DIAGNOSIS — H9190 Unspecified hearing loss, unspecified ear: Secondary | ICD-10-CM | POA: Diagnosis present

## 2022-03-08 DIAGNOSIS — K922 Gastrointestinal hemorrhage, unspecified: Secondary | ICD-10-CM | POA: Diagnosis present

## 2022-03-08 DIAGNOSIS — K50111 Crohn's disease of large intestine with rectal bleeding: Secondary | ICD-10-CM | POA: Diagnosis present

## 2022-03-08 DIAGNOSIS — K50118 Crohn's disease of large intestine with other complication: Secondary | ICD-10-CM | POA: Diagnosis not present

## 2022-03-08 DIAGNOSIS — F419 Anxiety disorder, unspecified: Secondary | ICD-10-CM | POA: Diagnosis present

## 2022-03-08 DIAGNOSIS — Z741 Need for assistance with personal care: Secondary | ICD-10-CM | POA: Diagnosis present

## 2022-03-08 DIAGNOSIS — Z7984 Long term (current) use of oral hypoglycemic drugs: Secondary | ICD-10-CM

## 2022-03-08 DIAGNOSIS — F40240 Claustrophobia: Secondary | ICD-10-CM | POA: Diagnosis present

## 2022-03-08 DIAGNOSIS — F4323 Adjustment disorder with mixed anxiety and depressed mood: Secondary | ICD-10-CM | POA: Diagnosis present

## 2022-03-08 DIAGNOSIS — Z8616 Personal history of COVID-19: Secondary | ICD-10-CM

## 2022-03-08 DIAGNOSIS — Z91048 Other nonmedicinal substance allergy status: Secondary | ICD-10-CM

## 2022-03-08 DIAGNOSIS — Z6831 Body mass index (BMI) 31.0-31.9, adult: Secondary | ICD-10-CM

## 2022-03-08 DIAGNOSIS — K625 Hemorrhage of anus and rectum: Secondary | ICD-10-CM | POA: Diagnosis not present

## 2022-03-08 LAB — CBC
HCT: 24.8 % — ABNORMAL LOW (ref 39.0–52.0)
Hemoglobin: 8 g/dL — ABNORMAL LOW (ref 13.0–17.0)
MCH: 32.4 pg (ref 26.0–34.0)
MCHC: 32.3 g/dL (ref 30.0–36.0)
MCV: 100.4 fL — ABNORMAL HIGH (ref 80.0–100.0)
Platelets: 291 10*3/uL (ref 150–400)
RBC: 2.47 MIL/uL — ABNORMAL LOW (ref 4.22–5.81)
RDW: 17 % — ABNORMAL HIGH (ref 11.5–15.5)
WBC: 9.2 10*3/uL (ref 4.0–10.5)
nRBC: 0 % (ref 0.0–0.2)

## 2022-03-08 LAB — GLUCOSE, CAPILLARY
Glucose-Capillary: 272 mg/dL — ABNORMAL HIGH (ref 70–99)
Glucose-Capillary: 290 mg/dL — ABNORMAL HIGH (ref 70–99)
Glucose-Capillary: 452 mg/dL — ABNORMAL HIGH (ref 70–99)
Glucose-Capillary: 305 mg/dL — ABNORMAL HIGH (ref 70–99)
Glucose-Capillary: 283 mg/dL — ABNORMAL HIGH (ref 70–99)

## 2022-03-08 LAB — BASIC METABOLIC PANEL
Anion gap: 7 (ref 5–15)
BUN: 12 mg/dL (ref 8–23)
CO2: 29 mmol/L (ref 22–32)
Calcium: 8.9 mg/dL (ref 8.9–10.3)
Chloride: 102 mmol/L (ref 98–111)
Creatinine, Ser: 0.51 mg/dL — ABNORMAL LOW (ref 0.61–1.24)
GFR, Estimated: >60 mL/min (ref >60)
Glucose, Bld: 262 mg/dL — ABNORMAL HIGH (ref 70–99)
Potassium: 4 mmol/L (ref 3.5–5.1)
Sodium: 138 mmol/L (ref 135–145)

## 2022-03-08 LAB — MAGNESIUM: Magnesium: 1.5 mg/dL — ABNORMAL LOW (ref 1.7–2.4)

## 2022-03-08 MED ORDER — MAGIC MOUTHWASH: 15.0000 mL | Freq: Four times a day (QID) | ORAL | Status: DC | PRN

## 2022-03-08 MED ORDER — MAGNESIUM OXIDE -MG SUPPLEMENT 400 (240 MG) MG PO TABS
200.0000 mg | ORAL_TABLET | Freq: Two times a day (BID) | ORAL | Status: DC
  Administered 2022-03-08 – 2022-03-11 (×6): 200 mg via ORAL
  Filled 2022-03-08 (×6): qty 1

## 2022-03-08 MED ORDER — PREDNISONE 20 MG PO TABS: 20.0000 mg | ORAL_TABLET | Freq: Every day | ORAL | Status: DC

## 2022-03-08 MED ORDER — PREDNISONE 10 MG PO TABS: 5.0000 mg | ORAL_TABLET | Freq: Every day | ORAL | Status: DC

## 2022-03-08 MED ORDER — PREDNISONE 10 MG PO TABS: 10.0000 mg | ORAL_TABLET | Freq: Every day | ORAL | Status: DC

## 2022-03-08 MED ORDER — INSULIN ASPART 100 UNIT/ML IJ SOLN
6.0000 [IU] | Freq: Three times a day (TID) | INTRAMUSCULAR | Status: DC
  Administered 2022-03-08 – 2022-03-11 (×8): 6 [IU] via SUBCUTANEOUS

## 2022-03-08 MED ORDER — NAPHAZOLINE-GLYCERIN 0.012-0.25 % OP SOLN: 1.0000 [drp] | Freq: Four times a day (QID) | OPHTHALMIC | Status: DC | PRN

## 2022-03-08 MED ORDER — ONDANSETRON HCL 4 MG/2ML IJ SOLN: 4.0000 mg | Freq: Four times a day (QID) | INTRAMUSCULAR | Status: DC | PRN

## 2022-03-08 MED ORDER — CHLORHEXIDINE GLUCONATE CLOTH 2 % EX PADS
6.0000 | MEDICATED_PAD | Freq: Every day | CUTANEOUS | Status: DC
  Administered 2022-03-09 – 2022-03-11 (×3): 6 via TOPICAL

## 2022-03-08 MED ORDER — INSULIN ASPART 100 UNIT/ML IJ SOLN
0.0000 [IU] | Freq: Every day | INTRAMUSCULAR | Status: DC
  Administered 2022-03-08 – 2022-03-11 (×4): 3 [IU] via SUBCUTANEOUS
  Administered 2022-03-12: 4 [IU] via SUBCUTANEOUS
  Administered 2022-03-13: 3 [IU] via SUBCUTANEOUS
  Administered 2022-03-14: 5 [IU] via SUBCUTANEOUS
  Administered 2022-03-15: 4 [IU] via SUBCUTANEOUS
  Administered 2022-03-16: 3 [IU] via SUBCUTANEOUS
  Administered 2022-03-17: 2 [IU] via SUBCUTANEOUS
  Administered 2022-03-18: 4 [IU] via SUBCUTANEOUS
  Administered 2022-03-19 – 2022-03-21 (×3): 3 [IU] via SUBCUTANEOUS
  Administered 2022-03-22: 5 [IU] via SUBCUTANEOUS
  Administered 2022-03-24: 2 [IU] via SUBCUTANEOUS

## 2022-03-08 MED ORDER — GUAIFENESIN ER 600 MG PO TB12: 600.0000 mg | ORAL_TABLET | Freq: Two times a day (BID) | ORAL | Status: DC | PRN

## 2022-03-08 MED ORDER — MENTHOL 3 MG MT LOZG: 1.0000 | LOZENGE | OROMUCOSAL | Status: DC | PRN

## 2022-03-08 MED ORDER — PREDNISONE 20 MG PO TABS: 30.0000 mg | ORAL_TABLET | Freq: Every day | ORAL | Status: DC

## 2022-03-08 MED ORDER — SIMETHICONE 40 MG/0.6ML PO SUSP: 80.0000 mg | Freq: Four times a day (QID) | ORAL | Status: DC | PRN

## 2022-03-08 MED ORDER — BISACODYL 10 MG RE SUPP: 10.0000 mg | Freq: Every day | RECTAL | Status: DC | PRN

## 2022-03-08 MED ORDER — HYDROXYZINE HCL 25 MG PO TABS: 25.0000 mg | ORAL_TABLET | Freq: Three times a day (TID) | ORAL | Status: DC | PRN

## 2022-03-08 MED ORDER — GUAIFENESIN-DM 100-10 MG/5ML PO SYRP: 5.0000 mL | ORAL_SOLUTION | Freq: Four times a day (QID) | ORAL | Status: DC | PRN

## 2022-03-08 MED ORDER — CEFADROXIL 500 MG PO CAPS
1000.0000 mg | ORAL_CAPSULE | Freq: Two times a day (BID) | ORAL | Status: AC
  Administered 2022-03-08 – 2022-03-11 (×7): 1000 mg via ORAL
  Filled 2022-03-08 (×7): qty 2

## 2022-03-08 MED ORDER — PREDNISONE 10 MG PO TABS: 15.0000 mg | ORAL_TABLET | Freq: Every day | ORAL | Status: DC

## 2022-03-08 MED ORDER — NONFORMULARY OR COMPOUNDED ITEM: 1.0000 | Freq: Every day | Status: DC

## 2022-03-08 MED ORDER — PANTOPRAZOLE SODIUM 40 MG PO TBEC
40.0000 mg | DELAYED_RELEASE_TABLET | Freq: Every day | ORAL | Status: DC
  Administered 2022-03-09 – 2022-03-25 (×17): 40 mg via ORAL
  Filled 2022-03-08 (×17): qty 1

## 2022-03-08 MED ORDER — MAGNESIUM SULFATE 4 GM/100ML IV SOLN
4.0000 g | Freq: Once | INTRAVENOUS | Status: DC
  Filled 2022-03-08: qty 100

## 2022-03-08 MED ORDER — METRONIDAZOLE 500 MG PO TABS
500.0000 mg | ORAL_TABLET | Freq: Two times a day (BID) | ORAL | Status: AC
  Administered 2022-03-08 – 2022-03-11 (×7): 500 mg via ORAL
  Filled 2022-03-08 (×7): qty 1

## 2022-03-08 MED ORDER — PREDNISONE 20 MG PO TABS
35.0000 mg | ORAL_TABLET | Freq: Every day | ORAL | Status: DC
  Administered 2022-03-22 – 2022-03-25 (×4): 35 mg via ORAL
  Filled 2022-03-08 (×4): qty 2

## 2022-03-08 MED ORDER — INSULIN ASPART 100 UNIT/ML IJ SOLN
0.0000 [IU] | Freq: Three times a day (TID) | INTRAMUSCULAR | Status: DC
  Administered 2022-03-08: 15 [IU] via SUBCUTANEOUS
  Administered 2022-03-09: 8 [IU] via SUBCUTANEOUS
  Administered 2022-03-09 (×2): 2 [IU] via SUBCUTANEOUS
  Administered 2022-03-10: 11 [IU] via SUBCUTANEOUS
  Administered 2022-03-10: 2 [IU] via SUBCUTANEOUS
  Administered 2022-03-10 – 2022-03-11 (×3): 3 [IU] via SUBCUTANEOUS
  Administered 2022-03-11 – 2022-03-12 (×2): 5 [IU] via SUBCUTANEOUS
  Administered 2022-03-12: 11 [IU] via SUBCUTANEOUS
  Administered 2022-03-12: 5 [IU] via SUBCUTANEOUS
  Administered 2022-03-13: 11 [IU] via SUBCUTANEOUS
  Administered 2022-03-13 (×2): 3 [IU] via SUBCUTANEOUS
  Administered 2022-03-14: 5 [IU] via SUBCUTANEOUS
  Administered 2022-03-14: 11 [IU] via SUBCUTANEOUS
  Administered 2022-03-14: 3 [IU] via SUBCUTANEOUS
  Administered 2022-03-15: 8 [IU] via SUBCUTANEOUS
  Administered 2022-03-15: 5 [IU] via SUBCUTANEOUS
  Administered 2022-03-15 – 2022-03-16 (×2): 3 [IU] via SUBCUTANEOUS
  Administered 2022-03-16: 2 [IU] via SUBCUTANEOUS
  Administered 2022-03-16: 8 [IU] via SUBCUTANEOUS
  Administered 2022-03-17: 3 [IU] via SUBCUTANEOUS
  Administered 2022-03-17: 5 [IU] via SUBCUTANEOUS
  Administered 2022-03-17: 8 [IU] via SUBCUTANEOUS
  Administered 2022-03-18: 11 [IU] via SUBCUTANEOUS
  Administered 2022-03-18: 2 [IU] via SUBCUTANEOUS
  Administered 2022-03-18: 8 [IU] via SUBCUTANEOUS
  Administered 2022-03-19: 3 [IU] via SUBCUTANEOUS
  Administered 2022-03-19: 5 [IU] via SUBCUTANEOUS
  Administered 2022-03-19: 11 [IU] via SUBCUTANEOUS
  Administered 2022-03-20 (×2): 3 [IU] via SUBCUTANEOUS
  Administered 2022-03-20 – 2022-03-21 (×2): 8 [IU] via SUBCUTANEOUS
  Administered 2022-03-22: 2 [IU] via SUBCUTANEOUS
  Administered 2022-03-22: 3 [IU] via SUBCUTANEOUS
  Administered 2022-03-22: 11 [IU] via SUBCUTANEOUS
  Administered 2022-03-23: 3 [IU] via SUBCUTANEOUS
  Administered 2022-03-23: 5 [IU] via SUBCUTANEOUS
  Administered 2022-03-23: 15 [IU] via SUBCUTANEOUS
  Administered 2022-03-24: 2 [IU] via SUBCUTANEOUS
  Administered 2022-03-24: 8 [IU] via SUBCUTANEOUS
  Administered 2022-03-25: 2 [IU] via SUBCUTANEOUS

## 2022-03-08 MED ORDER — PROCHLORPERAZINE MALEATE 5 MG PO TABS: 5.0000 mg | ORAL_TABLET | Freq: Four times a day (QID) | ORAL | Status: DC | PRN

## 2022-03-08 MED ORDER — ALUM & MAG HYDROXIDE-SIMETH 200-200-20 MG/5ML PO SUSP: 30.0000 mL | ORAL | Status: DC | PRN

## 2022-03-08 MED ORDER — POLYETHYLENE GLYCOL 3350 17 G PO PACK: 17.0000 g | PACK | Freq: Every day | ORAL | Status: DC | PRN

## 2022-03-08 MED ORDER — PREDNISONE 20 MG PO TABS
40.0000 mg | ORAL_TABLET | Freq: Every day | ORAL | Status: AC
  Administered 2022-03-09 – 2022-03-21 (×13): 40 mg via ORAL
  Filled 2022-03-08 (×13): qty 2

## 2022-03-08 MED ORDER — SODIUM CHLORIDE 0.9% FLUSH
10.0000 mL | INTRAVENOUS | Status: DC | PRN
  Administered 2022-03-24: 20 mL

## 2022-03-08 MED ORDER — ACETAMINOPHEN 325 MG PO TABS: 325.0000 mg | ORAL_TABLET | ORAL | Status: DC | PRN

## 2022-03-08 MED ORDER — DIPHENHYDRAMINE HCL 12.5 MG/5ML PO ELIX
12.5000 mg | ORAL_SOLUTION | Freq: Four times a day (QID) | ORAL | Status: DC | PRN
  Administered 2022-03-16 – 2022-03-17 (×2): 25 mg via ORAL
  Filled 2022-03-08 (×2): qty 10

## 2022-03-08 MED ORDER — PROSOURCE PLUS PO LIQD
30.0000 mL | Freq: Two times a day (BID) | ORAL | Status: DC
  Administered 2022-03-09 – 2022-03-11 (×5): 30 mL via ORAL
  Filled 2022-03-08 (×4): qty 30

## 2022-03-08 MED ORDER — LIP MEDEX EX OINT
1.0000 | TOPICAL_OINTMENT | Freq: Two times a day (BID) | CUTANEOUS | Status: DC
  Administered 2022-03-08 – 2022-03-25 (×26): 1 via TOPICAL
  Filled 2022-03-08: qty 7

## 2022-03-08 MED ORDER — THIAMINE MONONITRATE 100 MG PO TABS
100.0000 mg | ORAL_TABLET | Freq: Every day | ORAL | Status: DC
  Administered 2022-03-09 – 2022-03-25 (×17): 100 mg via ORAL
  Filled 2022-03-08 (×17): qty 1

## 2022-03-08 MED ORDER — ZINC SULFATE 220 (50 ZN) MG PO CAPS
220.0000 mg | ORAL_CAPSULE | Freq: Every day | ORAL | Status: DC
  Administered 2022-03-09 – 2022-03-25 (×17): 220 mg via ORAL
  Filled 2022-03-08 (×17): qty 1

## 2022-03-08 MED ORDER — PROCHLORPERAZINE EDISYLATE 10 MG/2ML IJ SOLN: 5.0000 mg | Freq: Four times a day (QID) | INTRAMUSCULAR | Status: DC | PRN

## 2022-03-08 MED ORDER — PREDNISONE 20 MG PO TABS: 25.0000 mg | ORAL_TABLET | Freq: Every day | ORAL | Status: DC

## 2022-03-08 MED ORDER — SALINE SPRAY 0.65 % NA SOLN: 1.0000 | NASAL | Status: DC | PRN

## 2022-03-08 MED ORDER — SODIUM CHLORIDE 0.9% FLUSH
10.0000 mL | Freq: Two times a day (BID) | INTRAVENOUS | Status: DC
  Administered 2022-03-08 – 2022-03-23 (×12): 10 mL

## 2022-03-08 MED ORDER — FLEET ENEMA 7-19 GM/118ML RE ENEM: 1.0000 | ENEMA | Freq: Once | RECTAL | Status: DC | PRN

## 2022-03-08 MED ORDER — PREDNISONE 20 MG PO TABS: 40.0000 mg | ORAL_TABLET | Freq: Every day | ORAL | Status: DC

## 2022-03-08 MED ORDER — INSULIN DETEMIR 100 UNIT/ML ~~LOC~~ SOLN
14.0000 [IU] | Freq: Every day | SUBCUTANEOUS | Status: DC
  Administered 2022-03-08 – 2022-03-11 (×4): 14 [IU] via SUBCUTANEOUS
  Filled 2022-03-08 (×5): qty 0.14

## 2022-03-08 MED ORDER — TRAZODONE HCL 50 MG PO TABS: 25.0000 mg | ORAL_TABLET | Freq: Every evening | ORAL | Status: DC | PRN

## 2022-03-08 MED ORDER — PROCHLORPERAZINE 25 MG RE SUPP: 12.5000 mg | Freq: Four times a day (QID) | RECTAL | Status: DC | PRN

## 2022-03-08 MED ORDER — HYDROCORTISONE 0.5 % EX CREA: TOPICAL_CREAM | Freq: Two times a day (BID) | CUTANEOUS | Status: DC | PRN

## 2022-03-08 MED ORDER — ORAL CARE MOUTH RINSE: 15.0000 mL | OROMUCOSAL | Status: DC | PRN

## 2022-03-08 MED ORDER — VITAMIN C 500 MG PO TABS
500.0000 mg | ORAL_TABLET | Freq: Every day | ORAL | Status: DC
  Administered 2022-03-08 – 2022-03-25 (×18): 500 mg via ORAL
  Filled 2022-03-08 (×18): qty 1

## 2022-03-08 MED ORDER — ATORVASTATIN CALCIUM 40 MG PO TABS
40.0000 mg | ORAL_TABLET | Freq: Every day | ORAL | Status: DC
  Administered 2022-03-09 – 2022-03-25 (×17): 40 mg via ORAL
  Filled 2022-03-08 (×16): qty 1

## 2022-03-08 NOTE — H&P (Signed)
Physical Medicine and Rehabilitation Admission H&P    CC: Debility due to multiple medical issues   HPI: Richard [name] is a 66 year old male with history of T2DM, severe OSA, ADHD, HTN, scrotal abscess s/p I&D 08/23, recent diagnosis of IBD who was admitted to [facility] on 02/07/22 with perforated bowel with peritonitis requiring removal emergent exploratory lap with mobilization splenic flexure, resection of descending and sigmoid colon with transverse colostomy by Dr. [name].  Hospital course was significant for ileus, encephalopathy due to PNA, abdominal distention with nausea requiring TNA as well as issues with fluid overload.  He developed anemia with rectal bleeding due to rectal varices with multiple ulcers in the mid and distal rectum as well as repeat grade mid rectal ulcer that was treated with hemostat spray.  He was also found to have LLQ abscess positive for E coli and bacteroides species that was treated with drain placement and initiation of IV Zosyn and Flagyl per Dr. Alcario Drought input.    PT OT was working with patient and CIR was recommended due to functional decline.  He was admitted to rehab on 03/01/2022 and continued to have issues with fluid overload as well as purulent drainage from his LLQ drain.  Cardiology was consulted and was started on IV diuresis.  He  had recurrent rectal bleeding due to multiple ulcers in distal colon tx with PuraSTAT gel by Dr. Ellis Savage. CBC showed stable H/H and colon mucosa has significantly improved on flex sig per GI. He had recurrent rectal bleeding that night and was transferred to acute hospital for closer monitoring.  He was started on trial of short chain fatty enema daily and Remicade administered 12/20 after discussion of risks v/s benefits. 2D echo showed 12/19 showed EF 60-65% with  no wall abnormality and trivial pericardial effusion. CT abdomen pelvis showed resolution of abscess therefore drain pulled 12/20  and he was transitioned to  [facility] and Flagyl thru 12/26. Cardiology has been following for IV diuresis and has signed off. Hypomagnesemia has been due to poor intake/GI losses has been supplemented. He was transitioned from solumedrol to prednisone today with plans for 20 mg X 2 weeks followed by taper of 5 mg/wk. Rectal bleeding has resolved. Po intake has been good and he has been weaned off daytime oxygen.  PT/OT evaluations completed and CIR recommended due to ongoing issues with weakness and debility. No new complaints this morning.    Review of Systems  Constitutional:  Negative for fever.  HENT:  Negative for hearing loss.   Eyes:  Negative for blurred vision.  Respiratory:  Negative for shortness of breath.   Cardiovascular:  Negative for chest pain and palpitations.  Gastrointestinal:  Positive for abdominal pain. Negative for heartburn and nausea.  Genitourinary:  Negative for dysuria and urgency.  Skin:  Negative for rash.  Neurological:  Positive for weakness. Negative for dizziness and headaches.  Psychiatric/Behavioral:  The patient has insomnia.      Past Medical History:  Diagnosis Date   Adult ADHD    Allergic rhinitis    Anxiety    claustrophobic   Bronchopneumonia 11/03/2013   Colon polyps 2012 and 2013   All hyperplastic except one adenomatous w/out high grade dysplasia (recall 2023 per Dr. Deatra Ina)   COVID-19 virus infection 09/29/2020   Elevated transaminase level 2017   Viral hep screening neg.  Suspect fatty liver.   Hyperlipidemia    Hypertension    Inflammatory bowel disease    01/2022  Large bowel perforation ([place]) 02/2022   delayed, s/p colonoscopy w/biopsies (new dx crohn's/inflammatory BD)   Obesity    OSA on CPAP    Severe.  CPAP 10 cm H2O.  Follows Dr. Ander Slade.   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 [name] to do surg   Pneumonia    Seborrheic dermatitis of scalp    and face: hydrocort 2% cream bid prn to face, and fluocinonide 0.5% sol'n to scalp bid  prn per Dr. Allyson Sabal (04/2014)   Secondary male hypogonadism 04/2021   Type 2 diabetes mellitus with microalbuminuria ([place]) DM dx-09/27/2015. Microalb 2020    Past Surgical History:  Procedure Laterality Date   COLONOSCOPY W/ POLYPECTOMY  04/2010; 10/2011   Initial screening TCS showed 6 polyps (one adenomatous).  Pt says he returned for repeat TCS 10/2011 showed one hyperplastic polyp: Recall 10 yrs per Dr. Samuella Cota SIGMOIDOSCOPY N/A 02/25/2022   Procedure: FLEXIBLE SIGMOIDOSCOPY;  Surgeon: Irving Copas., MD;  Location: [facility];  Service: Gastroenterology;  Laterality: N/A;   FLEXIBLE SIGMOIDOSCOPY N/A 03/04/2022   Procedure: FLEXIBLE SIGMOIDOSCOPY;  Surgeon: Lavena Bullion, DO;  Location: [place];  Service: Gastroenterology;  Laterality: N/A;   HEMOSTASIS CONTROL  02/25/2022   Procedure: HEMOSTASIS CONTROL;  Surgeon: Irving Copas., MD;  Location: [facility];  Service: Gastroenterology;;   HEMOSTASIS CONTROL  03/04/2022   Procedure: HEMOSTASIS CONTROL;  Surgeon: Lavena Bullion, DO;  Location: [place];  Service: Gastroenterology;;   INCISION AND DRAINAGE ABSCESS N/A 11/07/2021   Procedure: Incision and drainage of scrotal abscess;  Surgeon: Leighton Ruff, MD;  Location: [facility];  Service: General;  Laterality: N/A;   IR SINUS/FIST TUBE CHK-NON GI  03/06/2022   LAPAROTOMY N/A 02/07/2022   Procedure: EXPLORATORY LAPAROTOMY, COLON RESECTION, OSTOMY;  Surgeon: Leighton Ruff, MD;  Location: [facility];  Service: General;  Laterality: N/A;   MOUTH SURGERY     TRANSTHORACIC ECHOCARDIOGRAM     03/05/22 NORMAL    Family History  Problem Relation Age of Onset   Lung cancer Father    Cancer Father        nasal   Allergies Father    Colon cancer Neg Hx    Esophageal cancer Neg Hx    Rectal cancer Neg Hx    Stomach cancer Neg Hx     Social History:  reports that he quit smoking about 19 years ago. His smoking use included cigarettes. He smoked an  average of 1 pack per day. He has never used smokeless tobacco. He reports current alcohol use of about 7.0 standard drinks of alcohol per week. He reports that he does not use drugs.   Allergies  Allergen Reactions   Lobster [Shellfish Allergy] Nausea Only   Poison Ivy Extract Itching    Medications Prior to Admission  Medication Sig Dispense Refill   Accu-Chek Softclix Lancets lancets SMARTSIG:Topical     acetaminophen (TYLENOL) 325 MG tablet Take 2 tablets (650 mg total) by mouth every 4 (four) hours as needed for mild pain, moderate pain, fever or headache.     acetaminophen (TYLENOL) 500 MG tablet Take 1,000 mg by mouth every 6 (six) hours as needed for mild pain.     alum & mag hydroxide-simeth (MAALOX/MYLANTA) 200-200-20 MG/5ML suspension Take 30 mLs by mouth every 6 (six) hours as needed for indigestion or heartburn (or bloating). 355 mL 0   ascorbic acid (VITAMIN C) 500 MG tablet Take 1 tablet (500 mg total) by mouth 2 (  two) times daily.     atorvastatin (LIPITOR) 40 MG tablet TAKE 1 TABLET BY MOUTH EVERY DAY 90 tablet 1   cefTRIAXone (ROCEPHIN) 1 g injection Inject 2 g into the muscle daily. 1 each 0   clotrimazole-betamethasone (LOTRISONE) cream Apply 1 Application topically 2 (two) times daily. 30 g 1   diphenhydrAMINE (BENADRYL) 25 mg capsule Take 25 mg by mouth daily as needed for allergies.     diphenoxylate-atropine (LOMOTIL) 2.5-0.025 MG tablet Take 1-2 tablets by mouth 4 (four) times daily as needed for diarrhea or loose stools. 1-2 tabs po qid prn diarrhea 60 tablet 0   empagliflozin (JARDIANCE) 10 MG TABS tablet TAKE 1 TABLET BY MOUTH EVERY DAY BEFORE BREAKFAST (Patient taking differently: Take 10 mg by mouth daily.) 90 tablet 0   feeding supplement (ENSURE ENLIVE / ENSURE PLUS) LIQD Take 237 mLs by mouth 3 (three) times daily between meals. 237 mL 12   ferrous sulfate 325 (65 FE) MG tablet Take 1 tablet (325 mg total) by mouth 2 (two) times daily before a meal.  3    glucose blood (ACCU-CHEK GUIDE) test strip USE TO TEST BLOOD SUGAR TWICE DAILY 200 strip 0   hydrOXYzine (ATARAX) 25 MG tablet Take 1 tablet (25 mg total) by mouth 3 (three) times daily as needed for itching. 30 tablet 0   insulin aspart (NOVOLOG) 100 UNIT/ML injection Inject 0-15 Units into the skin 3 (three) times daily with meals. 10 mL 11   insulin aspart (NOVOLOG) 100 UNIT/ML injection Inject 0-5 Units into the skin at bedtime. 10 mL 11   lisinopril (ZESTRIL) 10 MG tablet TAKE 1 TABLET DAILY 90 tablet 0   metFORMIN (GLUCOPHAGE) 500 MG tablet TAKE 1 TABLET BY MOUTH TWICE A DAY WITH FOOD 180 tablet 1   methylphenidate (METADATE CD) 20 MG CR capsule Take 1 capsule (20 mg total) by mouth daily as needed. 30 capsule 0   methylPREDNISolone sodium succinate (SOLU-MEDROL) 40 mg/mL injection Inject 1 mL (40 mg total) into the vein daily. 1 each 0   metoCLOPramide (REGLAN) 5 MG tablet Take 1 tablet (5 mg total) by mouth 2 (two) times daily with breakfast and lunch.     metoprolol tartrate (LOPRESSOR) 25 MG tablet Take 1 tablet (25 mg total) by mouth 2 (two) times daily. 60 tablet 11   metroNIDAZOLE (FLAGYL) 500 MG/100ML Inject 100 mLs (500 mg total) into the vein every 12 (twelve) hours.     Multiple Vitamin (MULTIVITAMIN WITH MINERALS) TABS tablet Take 1 tablet by mouth daily.     ondansetron (ZOFRAN) 4 MG/2ML SOLN injection Inject 2 mLs (4 mg total) into the vein every 6 (six) hours as needed for nausea or vomiting. 2 mL 0   pantoprazole (PROTONIX) 40 MG tablet Take 1 tablet (40 mg total) by mouth daily.     polycarbophil (FIBERCON) 625 MG tablet Take 1 tablet (625 mg total) by mouth 2 (two) times daily.     SYRINGE-NEEDLE, DISP, 3 ML 22G X 1-1/2" 3 ML MISC USE TO ADMINISTER TESTOSTERONE INJECTION EVERY 2 WEEKS 50 each 1   Syringe/Needle, Disp, 18G X 1" 3 ML MISC USE TO ADMINISTER TESTOSTERONE INJECTION EVERY 2 WEEKS 50 each 1   testosterone cypionate (DEPOTESTOSTERONE CYPIONATE) 200 MG/ML injection  200 MG (1 ML) SQ Q 2 WEEKS (Patient not taking: Reported on 02/08/2022) 5 mL 1   thiamine (VITAMIN B-1) 100 MG tablet Take 1 tablet (100 mg total) by mouth daily.     Home: Home  Living Family/patient expects to be discharged to:: Private residence Living Arrangements: Spouse/significant other Available Help at Discharge: Family Type of Home: House Home Access: Stairs to enter Technical brewer of Steps: 5 Entrance Stairs-Rails: Left Home Layout: Two level Alternate Level Stairs-Number of Steps: 15 down to his  office Alternate Level Stairs-Rails: Left Bathroom Shower/Tub: Multimedia programmer: Programmer, systems: Yes Home Equipment: None  Lives With: Spouse   Functional History: Prior Function Prior Level of Function : Independent/Modified Independent 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 ,   Functional Status:  Mobility: Bed Mobility Overal bed mobility: Needs Assistance Bed Mobility: Supine to Sit, Sit to Supine Rolling: Max assist Supine to sit: Mod assist, +2 for physical assistance Sit to supine: Mod assist, +2 for physical assistance General bed mobility comments: Pt requires 2 person moderate assistance to go from supine<>sitting with assistance with bil LE and trunk with HOB elevated and heavy use of bed rails. Transfers Overall transfer level: Needs assistance Equipment used:  Charlaine Dalton) Transfers: Sit to/from Stand Sit to Stand: Mod assist, +2 physical assistance Transfer via Lift Equipment: Stedy General transfer comment: Pt stood at 2 person Mod A with EOB elevated to Stedy standing for ~5 min or greater while resting on steady seat for linen change. Ambulation/Gait General Gait Details: Due to current functional status unable to attempt   ADL: ADL Overall ADL's : Needs assistance/impaired Grooming: Oral care, Wash/dry face, Set up, Bed level Upper Body Bathing: Maximal assistance, Sitting Upper Body Bathing Details (indicate cue  type and reason): Due to multiple lines and leads. Lower Body Bathing: Total assistance, Bed level Upper Body Dressing : Maximal assistance, Sitting Lower Body Dressing: Bed level, Total assistance Toilet Transfer:  (NT) Toileting- Clothing Manipulation and Hygiene: Total assistance, Bed level   Cognition: Cognition Overall Cognitive Status: Within Functional Limits for tasks assessed Orientation Level: Oriented X4 Cognition Arousal/Alertness: Awake/alert Behavior During Therapy: Flat affect, Anxious Overall Cognitive Status: Within Functional Limits for tasks assessed General Comments: Does not like alot of people in and out of his room and one time; it's too much at once.     There were no vitals taken for this visit. Physical Exam Vitals and nursing note reviewed.  Constitutional:      Appearance: Normal appearance.  Cardiac: RRR Lungs: breathing comfortably on room air Abdominal:     General: There is no distension. +ostomy Neurological:     Mental Status: He is alert and oriented to person, place, and time.  Skin: intact Psych: flat affect, otherwise pleasant  Results for orders placed or performed during the hospital encounter of 03/08/22 (from the past 48 hour(s))  Glucose, capillary     Status: Abnormal   Collection Time: 03/08/22  5:21 PM  Result Value Ref Range   Glucose-Capillary 452 (H) 70 - 99 mg/dL    Comment: Glucose reference range applies only to samples taken after fasting for at least 8 hours.   DG Chest Port 1 View  Result Date: 03/07/2022 CLINICAL DATA:  Pleural effusion EXAM: PORTABLE CHEST 1 VIEW COMPARISON:  Portable exam 0549 hours compared to 03/03/2022 FINDINGS: RIGHT arm PICC line tip projects over SVC. Enlargement of cardiac silhouette. Stable mediastinal contours for degree of rotation. Bibasilar effusions and atelectasis. Upper lungs clear. No definite acute infiltrate, pneumothorax or acute osseous findings. IMPRESSION: Persistent bibasilar  pleural effusions and atelectasis. Electronically Signed   By: Lavonia Dana M.D.   On: 03/07/2022 08:22      There were no vitals  taken for this visit.  Medical Problem List and Plan: 1. Functional deficits secondary to debility  -patient may shower  -ELOS/Goals: 10-14 days S  Re-admit to CIR 2.  Antithrombotics: -DVT/anticoagulation:  Mechanical: Sequential compression devices, below knee Bilateral lower extremities  -antiplatelet therapy: N/A 3. Pain Management: tylenol prn 4. Mood/Behavior/Sleep: LCSW to follow for evaluation and support.   -antipsychotic agents: N/A 5. Neuropsych/cognition: This patient is capable of making decisions on his own behalf. 6. Skin/Wound Care: Wet to dry dressing changes daily to midline.   --continue Zinc. Add vitamin C  7. Fluids/Electrolytes/Nutrition: Monitor I/O. Check CMET in am  -- 8. Sigmoid perforation s/p resection w/abscess: Continue Duricef and Flagyl thru 12/26 9. Rectal ulcers:  Continue short chain fatty acid edemas daily after supper.  10. Crohn's disease: Remicade given 12/20 and repeat dose in 2 weeks per GI  --continue prednisone 40 mg X 2 weeks followed by 5 mg/week taper.   11. T2DM: Hgb A1C-6.1 and BS poorly controlled due to steroids/immobility.  --will monitor BS ac/hs and titrate insulin as indicated.  --continue Levemir 14 units with  6 units novolog for meal coverage.  12. Fluid overload/Pleural effusions: Daily wts with monitoring for overload  --intake improved and may need to add Low salt restrictions.  13. Hypomagnesemia: Receive IV supplementation 12/20 and 12/21.   --Mg 1.3-->1.5 and till low. Will add oral supplement. Recheck in am.  14. Severe OSA: Discussed need for CPAP/oxygen does not treat underling issues  --declines CPAP "too much at this time/can't lie on the left"  I have personally performed a face to face diagnostic evaluation, including, but not limited to relevant history and physical exam findings, of  this patient and developed relevant assessment and plan.  Additionally, I have reviewed and concur with the physician assistant's documentation above.  Reesa Chew, PA-C  Izora Ribas, MD 03/08/2022

## 2022-03-08 NOTE — Progress Notes (Signed)
Inpatient Rehabilitation Admission Medication Review by a Pharmacist  A complete drug regimen review was completed for this patient to identify any potential clinically significant medication issues.  High Risk Drug Classes Is patient taking? Indication by Medication  Antipsychotic Yes Prochlorperazine - as needed for nausea  Anticoagulant No   Antibiotic Yes Cefadroxil through 03/11/22 Metronidazole through 03/11/22  For sigmoid perf w/ abscess  Opioid No   Antiplatelet No   Hypoglycemics/insulin Yes Levemir, Novolog sliding scale and meal coverage - DM  Vasoactive Medication No   Chemotherapy No   Other Yes Infliximab - Chron's disease Next dose due 03/20/22  Prednisone taper on '40mg'$  daily x 2 weeks, then taper by '5mg'$  per week until off (per GI).  Short-chain fatty acid enemas daily - Chron's     Type of Medication Issue Identified Description of Issue Recommendation(s)  Drug Interaction(s) (clinically significant)     Duplicate Therapy     Allergy     No Medication Administration End Date     Incorrect Dose     Additional Drug Therapy Needed     Significant med changes from prior encounter (inform family/care partners about these prior to discharge).    Other       Clinically significant medication issues were identified that warrant physician communication and completion of prescribed/recommended actions by midnight of the next day:  No  Name of provider notified for urgent issues identified:   Provider Method of Notification:     Pharmacist comments:   Time spent performing this drug regimen review (minutes):  [place], Pharm.D., BCPS Clinical Pharmacist   03/08/2022 5:53 PM

## 2022-03-08 NOTE — Inpatient Diabetes Management (Signed)
Inpatient Diabetes Program Recommendations  AACE/ADA: New Consensus Statement on Inpatient Glycemic Control (2015)  Target Ranges:  Prepandial:   less than 140 mg/dL      Peak postprandial:   less than 180 mg/dL (1-2 hours)      Critically ill patients:  140 - 180 mg/dL   Lab Results  Component Value Date   GLUCAP 272 (H) 03/08/2022   HGBA1C 6.1 (H) 02/28/2022    Review of Glycemic Control  Latest Reference Range & Units 03/07/22 09:32 03/07/22 12:24 03/07/22 17:15 03/07/22 21:45 03/08/22 08:08  Glucose-Capillary 70 - 99 mg/dL 267 (H) 206 (H) 298 (H) 274 (H) 272 (H)  (H): Data is abnormally high  Diabetes history: DM2 Outpatient Diabetes medications: Metformin 500 mg BID, Novolog 0-15 units TID and 0-5 QHS, Jardiance (not taking) Current orders for Inpatient glycemic control:  Levemir 14 BID Novolog 0-15 tid and 0-5 qhs Novolog 6 mctid Prednisone 40 mg QD  Inpatient Diabetes Program Recommendations:    CBG's consistently elevated.  Please consider:  -Novolog 0-20 units TID and HS while on steroids -Levemir 16 units BID  Will continue to follow while inpatient.  Thank you, Reche Dixon, MSN, [place] Diabetes Coordinator Inpatient Diabetes Program [phone] (team pager from 8a-5p)

## 2022-03-08 NOTE — Discharge Summary (Signed)
DISCHARGE SUMMARY  Richard [name]  MR#: [id number]  DOB:May 11, 1955  Date of Admission: 03/05/2022 Date of Discharge: 03/08/2022  Attending Physician:Dora Simeone Hennie Duos, MD  Patient's WLN:[id number], Adrian Blackwater, MD  Consults: GI  Disposition: D/C to CIR   Follow-up Appts:  Follow-up Information     Leighton Ruff, MD. Go on 07/03/4079.   Specialties: General Surgery, Colon and Rectal Surgery Why: 1:30 PM. Please arrive 30 min prior to appointment time to check in. Contact information: [address] [place] [address] [phone]                 Discharge Diagnoses: Acute lower GI bleeding / large-volume hematochezia Recent intra-abdominal abscess Transient Hypoxia - pleural effusions Hypomagnesemia Crohn's disease DM2 Physical debility  Initial presentation: 67 year old with a history of DM2, HTN, HLD, OSA, Crohn's disease, and perforated sigmoid colon status post resection with transverse colostomy 88/50/2774 complicated by an intra-abdominal abscess who presented to the ER with large-volume hematochezia. He had been discharged to the inpatient rehab unit 03/01/2022 after a preceding admission during which sigmoidoscopy revealed numerous rectal ulcers which were treated with hemostatic gel. While in rehab he suffered the acute return of BRBPR but no bleeding from his ostomy.   Hospital Course:  Acute lower GI bleeding/large-volume hematochezia Underwent flexible sigmoidoscopy 12/18 noting multiple rectal ulcers which were treated with hemostatic gel - appears to have stabilized - care directed by GI service    Recent intra-abdominal abscess Underwent percutaneous drain placement in IR 02/26/2022 -cultures grew E. coli and Bacteroides species -continued Rocephin and Flagyl as per previous plan initially - follow-up imaging of drain 12/20 noted resolution of abscess cavity therefore drain removed - to complete 5 additional days of oral abx then can  d/c abx for this indication    Transient Hypoxia - pleural effusions Pleural effusions noted on CXR 12/17 - f/u CXR 12/21 noted persistent effusions - saturatons >90% on RA - suspect these are simply due to low oncotic state/albumin 2.2 - consider albumin infusion for sx improvement if becomes acutely sob, but no need presently    Hypomagnesemia Due to poor intake and increased GI loss - supplement and follow intermittently    Crohn's disease On chronic steroid therapy - Remicade has been dosed with repeat dosing planned in 2 weeks -Solu-Medrol transitioned to prednisone with plan to transition to off with taper - short-chain fatty acid enema dosed daily starting 03/06/22 - see GI notes for tx plan    DM2 A1c 6.15 February 2022 -insulin adjusted in setting of ongoing steroid therapy with CBG improving - will improve w/ weaning of steroids - avoid hypoglycemia    ?CHF - clinically ruled out  TTE noted EF 60-65% with no LVH and no significant valvular disease but unable to comment on diastolic function -suspect this is simply due to low albumin state and not indicative of true CHF   Physical debility Will require return to CIR     Allergies as of 03/08/2022       Reactions   Lobster [shellfish Allergy] Nausea Only   Poison Ivy Extract Itching      No current facility-administered medications for this encounter. No current outpatient medications on file.  Facility-Administered Medications Ordered in Other Encounters:    [START ON 03/09/2022] (feeding supplement) PROSource Plus liquid 30 mL, 30 mL, Oral, BID WC, Love, Pamela S, PA-C   acetaminophen (TYLENOL) tablet 325-650 mg, 325-650 mg, Oral, Q4H PRN, Love, Pamela S, PA-C   alum &  mag hydroxide-simeth (MAALOX/MYLANTA) 200-200-20 MG/5ML suspension 30 mL, 30 mL, Oral, Q4H PRN, Love, Pamela S, PA-C   ascorbic acid (VITAMIN C) tablet 500 mg, 500 mg, Oral, Daily, Love, Pamela S, PA-C, 500 mg at 03/08/22 1731   [START ON 03/09/2022]  atorvastatin (LIPITOR) tablet 40 mg, 40 mg, Oral, Daily, Love, Pamela S, PA-C   bisacodyl (DULCOLAX) suppository 10 mg, 10 mg, Rectal, Daily PRN, Love, Pamela S, PA-C   cefadroxil (DURICEF) capsule 1,000 mg, 1,000 mg, Oral, BID, Love, Pamela S, PA-C   [START ON 03/09/2022] Chlorhexidine Gluconate Cloth 2 % PADS 6 each, 6 each, Topical, Q0600, Love, Pamela S, PA-C   diphenhydrAMINE (BENADRYL) 12.5 MG/5ML elixir 12.5-25 mg, 12.5-25 mg, Oral, Q6H PRN, Love, Pamela S, PA-C   guaiFENesin (MUCINEX) 12 hr tablet 600 mg, 600 mg, Oral, BID PRN, Love, Pamela S, PA-C   guaiFENesin-dextromethorphan (ROBITUSSIN DM) 100-10 MG/5ML syrup 5-10 mL, 5-10 mL, Oral, Q6H PRN, Love, Pamela S, PA-C   hydrocortisone cream 0.5 %, , Topical, BID PRN, Love, Pamela S, PA-C   hydrOXYzine (ATARAX) tablet 25 mg, 25 mg, Oral, TID PRN, Love, Pamela S, PA-C   insulin aspart (novoLOG) injection 0-15 Units, 0-15 Units, Subcutaneous, TID WC, Love, Pamela S, PA-C, 15 Units at 03/08/22 1731   insulin aspart (novoLOG) injection 0-5 Units, 0-5 Units, Subcutaneous, QHS, Love, Pamela S, PA-C   insulin aspart (novoLOG) injection 6 Units, 6 Units, Subcutaneous, TID WC, Love, Pamela S, PA-C, 6 Units at 03/08/22 1732   insulin detemir (LEVEMIR) injection 14 Units, 14 Units, Subcutaneous, QHS, Love, Pamela S, PA-C   lip balm (CARMEX) ointment 1 Application, 1 Application, Topical, BID, Love, Pamela S, PA-C   magic mouthwash, 15 mL, Oral, QID PRN, Love, Pamela S, PA-C   magnesium oxide (MAG-OX) tablet 200 mg, 200 mg, Oral, BID, Love, Pamela S, PA-C   menthol-cetylpyridinium (CEPACOL) lozenge 3 mg, 1 lozenge, Oral, PRN, Love, Pamela S, PA-C   metroNIDAZOLE (FLAGYL) tablet 500 mg, 500 mg, Oral, Q12H, Love, Pamela S, PA-C   naphazoline-glycerin (CLEAR EYES REDNESS) ophth solution 1-2 drop, 1-2 drop, Both Eyes, QID PRN, Love, Pamela S, PA-C   NONFORMULARY OR COMPOUNDED ITEM 1 enema, 1 enema, Rectal, QHS, Love, Pamela S, PA-C   ondansetron (ZOFRAN)  injection 4 mg, 4 mg, Intravenous, Q6H PRN, Love, Pamela S, PA-C   Oral care mouth rinse, 15 mL, Mouth Rinse, PRN, Love, Pamela S, PA-C   [START ON 03/09/2022] pantoprazole (PROTONIX) EC tablet 40 mg, 40 mg, Oral, Daily, Love, Pamela S, PA-C   polyethylene glycol (MIRALAX / GLYCOLAX) packet 17 g, 17 g, Oral, Daily PRN, Love, Pamela S, PA-C   [START ON 03/09/2022] predniSONE (DELTASONE) tablet 40 mg, 40 mg, Oral, Q breakfast, Love, Pamela S, PA-C   prochlorperazine (COMPAZINE) tablet 5-10 mg, 5-10 mg, Oral, Q6H PRN **OR** prochlorperazine (COMPAZINE) injection 5-10 mg, 5-10 mg, Intramuscular, Q6H PRN **OR** prochlorperazine (COMPAZINE) suppository 12.5 mg, 12.5 mg, Rectal, Q6H PRN, Love, Pamela S, PA-C   simethicone (MYLICON) 40 HK/7.4QV suspension 80 mg, 80 mg, Oral, QID PRN, Love, Pamela S, PA-C   sodium chloride (OCEAN) 0.65 % nasal spray 1-2 spray, 1-2 spray, Each Nare, PRN, Love, Pamela S, PA-C   sodium chloride flush (NS) 0.9 % injection 10-40 mL, 10-40 mL, Intracatheter, Q12H, Love, Pamela S, PA-C   sodium chloride flush (NS) 0.9 % injection 10-40 mL, 10-40 mL, Intracatheter, PRN, Love, Pamela S, PA-C   sodium phosphate (FLEET) 7-19 GM/118ML enema 1 enema, 1 enema, Rectal, Once PRN,  Bary Leriche, PA-C   [START ON 03/09/2022] thiamine (VITAMIN B1) tablet 100 mg, 100 mg, Oral, Daily, Love, Pamela S, PA-C   traZODone (DESYREL) tablet 25-50 mg, 25-50 mg, Oral, QHS PRN, Love, Pamela S, PA-C   [START ON 03/09/2022] zinc sulfate capsule 220 mg, 220 mg, Oral, Daily, Love, Pamela S, PA-C   Day of Discharge BP 129/82 (BP Location: Left Arm)   Pulse (!) 103   Temp 99 F (37.2 C) (Oral)   Resp 16   SpO2 96%   Physical Exam: General: No acute respiratory distress Lungs: Clear to auscultation bilaterally  Cardiovascular: Regular rate and rhythm  Abdomen: Nontender, nondistended, soft, bowel sounds positive Extremities: trace edema bilateral lower extremities  Basic Metabolic Panel: Recent  Labs  Lab 03/03/22 1842 03/04/22 0425 03/04/22 1433 03/06/22 0202 03/07/22 0506 03/08/22 0430  NA 137 136 138 138 137 138  K 4.2 3.9 4.0 3.8 3.8 4.0  CL 101 98 98 102 101 102  CO2 28 30  --  '29 30 29  '$ GLUCOSE 358* 249* 307* 214* 228* 262*  BUN '21 21 17 14 13 12  '$ CREATININE 0.57* 0.45* 0.40* 0.37* 0.44* 0.51*  CALCIUM 8.5* 8.7*  --  8.3* 8.6* 8.9  MG  --   --   --  1.3* 1.6* 1.5*   CBC: Recent Labs  Lab 03/03/22 1000 03/04/22 0425 03/04/22 1433 03/05/22 0012 03/05/22 1350 03/06/22 0202 03/07/22 0506 03/08/22 0430  WBC 13.4* 10.4  --  10.7* 17.6* 10.5 9.5 9.2  NEUTROABS 11.4* 8.0*  --   --   --   --   --   --   HGB 10.4* 9.4*   < > 8.9* 10.0* 8.2* 8.2* 8.0*  HCT 32.7* 29.2*   < > 27.3* 30.4* 25.6* 25.3* 24.8*  MCV 97.6 98.0  --  97.5 97.4 99.2 98.4 100.4*  PLT 359 325  --  328 374 332 323 291   < > = values in this interval not displayed.    Time spent in discharge (includes decision making & examination of pt): 35 minutes  03/08/2022, 10:40 AM   Cherene Altes, MD [facility] Office  [phone]

## 2022-03-08 NOTE — Progress Notes (Signed)
Physical Therapy Treatment Patient Details Name: Richard [name] MRN: [id number] DOB: [birth year] Today's Date: 03/08/2022   History of Present Illness Richard [name] is 66 y.o. male with PMH significant for DM 2, HTN, hyperlipidemia, OSA, Crohn's disease and perforated sigmoid colong s/p resection and transverse colostomy on 09/73/53 complicated by intra abdominal abscess who has has been passing large folume hematochezia. Pt was discharged to CIR 12/15. Two days ago pt had sudden onset of bright red blodo per rectum. Currently psot op emergent flexible sigmoidoscopy with hemostatic gel application to seal multiple distal rectal ulcers.    PT Comments    Pt received in supine, agreeable to therapy session with encouragement, emphasis on transfer training and seated/standing balance. Pt needing up to modA +2 for sit<>stand to RW and to perform pre-gait standing tasks. Standing tolerance limited due to fatigue and RN entering room to give enema at end of session. Pt spouse present and supportive. Per pt, plan for return to CIR today. Pt continues to benefit from PT services to progress toward functional mobility goals.    Recommendations for follow up therapy are one component of a multi-disciplinary discharge planning process, led by the attending physician.  Recommendations may be updated based on patient status, additional functional criteria and insurance authorization.  Follow Up Recommendations  Acute inpatient rehab (3hours/day)     Assistance Recommended at Discharge Frequent or constant Supervision/Assistance  Patient can return home with the following Two people to help with walking and/or transfers;Assistance with cooking/housework;Assist for transportation;Help with stairs or ramp for entrance   Equipment Recommendations  Other (comment) (TBD)    Recommendations for Other Services Rehab consult     Precautions / Restrictions Precautions Precautions: None Precaution Comments: L  colostomy, monitor O2/HR Restrictions Weight Bearing Restrictions: No     Mobility  Bed Mobility Overal bed mobility: Needs Assistance Bed Mobility: Rolling, Sidelying to Sit, Sit to Supine Rolling: Min assist Sidelying to sit: Mod assist, +2 for physical assistance   Sit to supine: Mod assist, +2 for physical assistance (Assist to bring BLEs up onto bed. Pt able to use bed rail to manage UB.)   General bed mobility comments: Pt requires 2 person moderate assistance to go from supine<>sitting with assistance with bil LE and trunk with HOB elevated and heavy use of bed rails. Dense cues and increased time/effort to perform; he tried to sit up with +1 assist but ultimately needed +2.    Transfers Overall transfer level: Needs assistance Equipment used: Rolling walker (2 wheels) Transfers: Sit to/from Stand Sit to Stand: Mod assist, From elevated surface, +2 physical assistance           General transfer comment: Use of momentum strategy to achieve upright, dense cues for sequencing, L knee and R foot blocked for safety as he reports concern for buckling.    Ambulation/Gait Ambulation/Gait assistance: Mod assist   Assistive device: Rolling walker (2 wheels)       Pre-gait activities: standing weight shift/hip flexion x5 reps ea LE; too fatigued to side step or pivot today     Stairs             Wheelchair Mobility    Modified Rankin (Stroke Patients Only)       Balance Overall balance assessment: Needs assistance Sitting-balance support: No upper extremity supported, Feet supported Sitting balance-Leahy Scale: Fair     Standing balance support: During functional activity, Reliant on assistive device for balance, Bilateral upper extremity supported Standing balance-Leahy Scale: Poor  Standing balance comment: +2 external assist with RW                            Cognition Arousal/Alertness: Awake/alert Behavior During Therapy: Flat affect,  Anxious Overall Cognitive Status: Within Functional Limits for tasks assessed                                 General Comments: pt states he gets claustrophobic; very self-directed; spouse present and encouraging him        Exercises Other Exercises Other Exercises: seated BLE AROM: LAQ, hip flexion x5 reps ea Other Exercises: standing BLE AROM: hip flexion x5 reps ea (weight shifting at RW)    General Comments General comments (skin integrity, edema, etc.): VSS, no acute s/sx distress, ostomy pouch recently changed and no output; pt states he is to have an enema in a short while.      Pertinent Vitals/Pain Pain Assessment Pain Assessment: Faces Faces Pain Scale: Hurts little more Pain Location: generalized during mobility and movement Pain Descriptors / Indicators: Grimacing, Discomfort Pain Intervention(s): Monitored during session, Repositioned    Home Living                          Prior Function            PT Goals (current goals can now be found in the care plan section) Acute Rehab PT Goals Patient Stated Goal: To get home once he is stronger PT Goal Formulation: With patient/family Time For Goal Achievement: 03/20/22 Progress towards PT goals: Progressing toward goals    Frequency    Min 3X/week      PT Plan Current plan remains appropriate    Co-evaluation              AM-PAC PT "6 Clicks" Mobility   Outcome Measure  Help needed turning from your back to your side while in a flat bed without using bedrails?: A Lot Help needed moving from lying on your back to sitting on the side of a flat bed without using bedrails?: A Lot Help needed moving to and from a bed to a chair (including a wheelchair)?: Total Help needed standing up from a chair using your arms (e.g., wheelchair or bedside chair)?: Total (+2 maxA) Help needed to walk in hospital room?: Total Help needed climbing 3-5 steps with a railing? : Total 6 Click  Score: 8    End of Session Equipment Utilized During Treatment: Gait belt;Oxygen Activity Tolerance: Patient tolerated treatment well;Patient limited by fatigue Patient left: in bed;with call bell/phone within reach;with bed alarm set;with nursing/sitter in room;with family/visitor present;Other (comment) (RN entering room for enema) Nurse Communication: Mobility status;Need for lift equipment PT Visit Diagnosis: Pain;Muscle weakness (generalized) (M62.81);Other abnormalities of gait and mobility (R26.89) Pain - part of body:  (generalized)     Time: 9702-6378 PT Time Calculation (min) (ACUTE ONLY): 19 min  Charges:  $Therapeutic Activity: 8-22 mins                     Naoma Boxell P., PTA Acute Rehabilitation Services Secure Chat Preferred 9a-5:30pm Office: [place] 03/08/2022, 6:54 PM

## 2022-03-08 NOTE — Consult Note (Signed)
[place] nurse consulted at 1425 today, however patient [place] nurse is noted to have seen patient at 1300 today, please see consult note [place], [place], [place]

## 2022-03-08 NOTE — Progress Notes (Addendum)
Attending physician's note   I have taken a history, reviewed the chart, and examined the patient. I performed a substantive portion of this encounter, including complete performance of at least one of the key components, in conjunction with the APP. I agree with the APP's note, impression, and recommendations with my edits.  No reason that we cannot change timing of the short-chain fatty acid enemas to the evening.  Appreciate wound/ostomy care evaluating his possible pressure ulcer.  IBD management as outlined.  GI service will follow intermittently through the holiday weekend.  Zaine Elsass, DO, FACG [phone] office          Progress Note   Subjective  Chief Complaint: Crohn's disease with sigmoid perforation and rectal ulcers  Today, the patient was found with his wife by his bedside.  He tells me no acute events overnight in fact he continues to incrementally get better.  They do discuss concerns about a possible pressure ulcer on his bottom and the fact that he is not in rehab and has not been moved out of his bed lately.  Also concerned that he is getting his enemas around lunchtime.  Also discussed how many people come in and out of his room and how confusing it is.  In general slowly improving and awaiting a bed at rehab.   Objective   Vital signs in last 24 hours: Temp:  [98.6 F (37 C)-99.1 F (37.3 C)] 99 F (37.2 C) (12/22 0712) Pulse Rate:  [78-103] 103 (12/22 0712) Resp:  [16-18] 16 (12/22 0712) BP: (110-146)/(74-82) 129/82 (12/22 0712) SpO2:  [96 %-100 %] 96 % (12/22 0712) Last BM Date : 03/07/22 General:    white male in NAD Heart:  Regular rate and rhythm; no murmurs Lungs: Respirations even and unlabored, lungs CTA bilaterally Abdomen:  Soft, moderate ttp generalized and mild distension. Normal bowel sounds.+ostomy with brown stool Psych:  Cooperative. Normal mood and affect.  Intake/Output from previous day: 12/21 0701 - 12/22 0700 In: 1240  [P.O.:1240] Out: 3546 [Urine:3450; Stool:1700]  Lab Results: Recent Labs    03/06/22 0202 03/07/22 0506 03/08/22 0430  WBC 10.5 9.5 9.2  HGB 8.2* 8.2* 8.0*  HCT 25.6* 25.3* 24.8*  PLT 332 323 291   BMET Recent Labs    03/06/22 0202 03/07/22 0506 03/08/22 0430  NA 138 137 138  K 3.8 3.8 4.0  CL 102 101 102  CO2 '29 30 29  '$ GLUCOSE 214* 228* 262*  BUN '14 13 12  '$ CREATININE 0.37* 0.44* 0.51*  CALCIUM 8.3* 8.6* 8.9   LFT Recent Labs    03/06/22 0202 03/07/22 0506  PROT 5.4* 5.6*  ALBUMIN 2.1* 2.2*  AST 23 22  ALT 19 19  ALKPHOS 99 105  BILITOT 0.4 0.2*  BILIDIR <0.1  --   IBILI NOT CALCULATED  --     Studies/Results: DG Chest Port 1 View  Result Date: 03/07/2022 CLINICAL DATA:  Pleural effusion EXAM: PORTABLE CHEST 1 VIEW COMPARISON:  Portable exam 0549 hours compared to 03/03/2022 FINDINGS: RIGHT arm PICC line tip projects over SVC. Enlargement of cardiac silhouette. Stable mediastinal contours for degree of rotation. Bibasilar effusions and atelectasis. Upper lungs clear. No definite acute infiltrate, pneumothorax or acute osseous findings. IMPRESSION: Persistent bibasilar pleural effusions and atelectasis. Electronically Signed   By: Lavonia Dana M.D.   On: 03/07/2022 08:22     Assessment / Plan:   Assessment: 1.  Crohn's disease 2.  Sigmoid perforation 3.  Rectal ulcers:  Newly diagnosed severe, penetrating, advanced phenotypic colonic Crohn's disease diagnosed 01/2022 - Admitted with sigmoid perforation, requiring emergent surgery on 02/07/2022 with descending/sigmoid colectomy, transverse colostomy. -12/11 sigmoidoscopy performed emergently and notable for deep cratered rectal ulcers with bleeding stigmata, treated with PuraSTAT gel -02/26/22 IR drainage large LLQ abscess, grew E coli, bacteroides. Flagyl since 12/13, Rocephin since 12/15.  Drain interrogated (no fistula, collapsed/resolved abscess) and removed on 12/20.   -12/18 repeat flexible sigmoidoscopy  for recurrent bleed.  Cratered ulcers, but overall improved from the previous sigmoidoscopy.  Again treated any stigmata with PuraSTAT gel with resolution -12/20 started Remicade 12/20 along with scfa enemas for possible overlapping diversion colitis. 4.  Diabetes: Elevated blood glucose 2/2 steroids management per primary hospitalist service 5.  Pleural effusion: Management per cardiology service  Plan: 1.  Recommend patient be seen again by wound/ostomy care, apparently missing some ostomy supplies since he moved to this room and feels like he needs his bag changed out.  Also concerns about a pressure ulcer on his bottom which should be addressed. 2.  Patient would also benefit from PT to at least get up and sitting on the side of the bed/standing up for period of time. 3.  Patient would benefit from changing positions every few hours to prevent pressure ulcers 4.  Continue Prednisone 40 mg daily for 2 weeks, then taper down by 5 mg/week until finished 5.  Remicade given on 12/20, dose #2 will be due 2 weeks from then 6.  Continue short-chain fatty acid enemas daily, patient request that these not be done at noon, ideally would be given around bedtime 7.  Continue Flagyl 8.  Again patient will need hepatitis B vaccine series later on this admission or could be done as outpatient 8.  Continue current diet  Thank you for your kind consultation.   LOS: 3 days   Levin Erp  03/08/2022, 10:10 AM

## 2022-03-08 NOTE — Progress Notes (Incomplete)
Inpatient Rehabilitation {Admission / Discharge:26440} Medication Review by a Pharmacist  A complete drug regimen review was completed for this patient to identify any potential clinically significant medication issues.  High Risk Drug Classes Is patient taking? Indication by Medication  Antipsychotic {Receiving?:26196}   Anticoagulant {Receiving?:26196}   Antibiotic {Receiving?:26196}   Opioid {Receiving?:26196}   Antiplatelet {Receiving?:26196}   Hypoglycemics/insulin {Yes or No?:26198}   Vasoactive Medication {Receiving?:26196}   Chemotherapy {Receiving Chemo?:26197}   Other {Yes or No?:26198}      Type of Medication Issue Identified Description of Issue Recommendation(s)  Drug Interaction(s) (clinically significant)     Duplicate Therapy     Allergy     No Medication Administration End Date     Incorrect Dose     Additional Drug Therapy Needed     Significant med changes from prior encounter (inform family/care partners about these prior to discharge).  Communicate medication changes with patient/family at discharge  Other       Clinically significant medication issues were identified that warrant physician communication and completion of prescribed/recommended actions by midnight of the next day:  {Yes or No?:26198}  Name of provider notified for urgent issues identified: ***   Provider Method of Notification: ***    Pharmacist comments: ***   Time spent performing this drug regimen review (minutes): 20   Thank you for allowing [place] to participate in this patients care. Jens Som, PharmD 03/08/2022 3:13 PM  **Pharmacist phone directory can be found on [email or web address] listed under [place]**

## 2022-03-08 NOTE — Progress Notes (Addendum)
PMR Admission Coordinator Pre-Admission Assessment   Patient: Richard [name] is an 66 y.o., male MRN: [id number] DOB: August 19, 1955 Height:   Weight:     Insurance Information HMO:     PPO: yes     PCP:      IPA:      80/20:      OTHER:  PRIMARY: [employer] Generic      Policy#: [id number]      Subscriber: Pt.  CM Name: Delray Alt      Phone#: [phone]     Fax#: [phone] Pre-Cert#: [id number] approval from 12/21 for 7 days with update due on 03/14/22 by 1 pm.      Employer:  Benefits:  Phone #:      Name:  Irene Shipper Date: 03/18/2018 - still active Deductible: $1,000 ($1,000 met) OOP Max: $5,500 ($5,500 met) CIR: $300 copay/admission, then 70% coverage, 30% co-insurance SNF: 70% coverage, 30% co-insurance Outpatient: 70% coverage, 30% co-insurance Home Health:  70% coverage, 30% co-insurance DME: 70% coverage, 30% co-insurance  SECONDARY: Medicare part A       Policy#:[id number]       Phone#:    Development worker, community:       Phone#:    The Therapist, art Information Summary" for patients in Inpatient Rehabilitation Facilities with attached "Privacy Act [place] Records" was provided and verbally reviewed with: N/A   Emergency Contact Information Contact Information       Name Relation Home Work Mobile    Box Spouse [phone]   [phone]           Current Medical History  Patient Admitting Diagnosis: Debility, GI bleed History of Present Illness: Pt is a 66 yo male former smoker who  developed diarrhea about 3 to 4 weeks prior to admission.  He was seen by Dr. [name] with GI and had colonoscopy on 02/04/22.  This showed diffuse, severe inflammation throughout the colon with mucosal friability. Biopsies were performed.  He was found to have Crohn's colitis and started on prednisone.  He started having discomfort in his abdomen, and  on 02/08/22 Pt. Was brought to the [facility] ED. On admission, he had fever 102.1 F with tachycardia.  CT abd/pelvis  showed findings compatible with perforated bowel with moderate free fluid and free air.  He was started on antibiotics and IV fluids.  Surgery consulted to assess for emergent laparotomy. On 11/24, patient underwent exploratory laparotomy, descending and sigmoid colon resection, transverse colostomy by Dr. [name].Surgical pathology showed transmural inflammation consistent with IBD. His postop course was complicated by almost 2 weeks of postop ileus which gradually resolved.  In the 2 weeks duration, he had significant fluid and electrolyte imbalance, hypoxia, fever, encephalopathy and physical deconditioning.  He received TPN, discontinued on 02/23/2022,  as he is tolerating a solid diet.. Pt. Also with ABL post op, received 1 unit PRBCS  12/9.  Pt. Was admitted to CIR 12/12 and made good progress with therapies. Transferred off due to rectal bleeding concerning for refurrent GI bleed  12/19. Pt. Seen again  by PT/OT who recommended CIR to assist return to PLOF.    Patient's [facility] has been reviewed by the rehabilitation admission coordinator and physician.   Past Medical History      Past Medical History:  Diagnosis Date   Adult ADHD     Allergic rhinitis     Anxiety      claustrophobic   Bronchopneumonia 11/03/2013   Colon polyps  2012 and 2013    All hyperplastic except one adenomatous w/out high grade dysplasia (recall 2023 per Dr. Deatra Ina)   COVID-19 virus infection 09/29/2020   Elevated transaminase level 2017    Viral hep screening neg.  Suspect fatty liver.   Hyperlipidemia     Hypertension     Inflammatory bowel disease      01/2022   Large bowel perforation ([place]) 02/2022    delayed, s/p colonoscopy w/biopsies (new dx crohn's/inflammatory BD)   Obesity     OSA on CPAP      Severe.  CPAP 10 cm H2O.  Follows Dr. Ander Slade.   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 [name] to do surg   Pneumonia     Seborrheic dermatitis of  scalp      and face: hydrocort 2% cream bid prn to face, and fluocinonide 0.5% sol'n to scalp bid prn per Dr. 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 Microalb 2020      Has the patient had major surgery during 100 days prior to admission? Yes   Family History   family history includes Allergies in his father; Cancer in his father; Lung cancer in his father.   Current Medications   Current Facility-Administered Medications:    acetaminophen (TYLENOL) tablet 650 mg, 650 mg, Oral, Q6H PRN, 650 mg at 03/06/22 1145 **OR** [DISCONTINUED] acetaminophen (TYLENOL) suppository 650 mg, 650 mg, Rectal, Q6H PRN, Opyd, Ilene Qua, MD   alum & mag hydroxide-simeth (MAALOX/MYLANTA) 200-200-20 MG/5ML suspension 30 mL, 30 mL, Oral, Q6H PRN, Opyd, Ilene Qua, MD   atorvastatin (LIPITOR) tablet 40 mg, 40 mg, Oral, Daily, Opyd, Ilene Qua, MD, 40 mg at 03/08/22 0845   cefadroxil (DURICEF) capsule 1,000 mg, 1,000 mg, Oral, BID, Cherene Altes, MD, 1,000 mg at 03/08/22 0846   Chlorhexidine Gluconate Cloth 2 % PADS 6 each, 6 each, Topical, Q0600, Geradine Girt, DO, 6 each at 03/07/22 3716   diphenhydrAMINE (BENADRYL) 12.5 MG/5ML elixir 12.5-25 mg, 12.5-25 mg, Oral, Q6H PRN, Opyd, Ilene Qua, MD, 25 mg at 03/07/22 2228   guaiFENesin (MUCINEX) 12 hr tablet 600 mg, 600 mg, Oral, BID PRN, Cherene Altes, MD   hydrocortisone cream 0.5 %, , Topical, BID PRN, Opyd, Ilene Qua, MD   hydrOXYzine (ATARAX) tablet 25 mg, 25 mg, Oral, TID PRN, Opyd, Ilene Qua, MD   insulin aspart (novoLOG) injection 0-15 Units, 0-15 Units, Subcutaneous, TID WC, Vann, Jessica U, DO, 8 Units at 03/08/22 0843   insulin aspart (novoLOG) injection 0-5 Units, 0-5 Units, Subcutaneous, QHS, Vann, Jessica U, DO, 3 Units at 03/07/22 2218   insulin aspart (novoLOG) injection 6 Units, 6 Units, Subcutaneous, TID WC, Cherene Altes, MD, 6 Units at 03/08/22 0844    insulin detemir (LEVEMIR) injection 14 Units, 14 Units, Subcutaneous, QHS, Cherene Altes, MD, 14 Units at 03/07/22 2217   lip balm (CARMEX) ointment 1 Application, 1 Application, Topical, BID, Opyd, Ilene Qua, MD, 1 Application at 96/78/93 8101   magic mouthwash, 15 mL, Oral, QID PRN, Opyd, Ilene Qua, MD   menthol-cetylpyridinium (CEPACOL) lozenge 3 mg, 1 lozenge, Oral, PRN, Opyd, Ilene Qua, MD   metroNIDAZOLE (FLAGYL) tablet 500 mg, 500 mg, Oral, Q12H, Joette Catching T, MD, 500 mg at 03/08/22 0845   naphazoline-glycerin (CLEAR EYES REDNESS) ophth solution 1-2 drop, 1-2 drop, Both Eyes, QID PRN, Opyd, Ilene Qua, MD   ondansetron (ZOFRAN) injection 4 mg,  4 mg, Intravenous, Q6H PRN, Opyd, Ilene Qua, MD   Oral care mouth rinse, 15 mL, Mouth Rinse, PRN, Opyd, Ilene Qua, MD   pantoprazole (PROTONIX) EC tablet 40 mg, 40 mg, Oral, Daily, Opyd, Ilene Qua, MD, 40 mg at 03/08/22 0845   phenol (CHLORASEPTIC) mouth spray 2 spray, 2 spray, Mouth/Throat, PRN, Opyd, Ilene Qua, MD   predniSONE (DELTASONE) tablet 40 mg, 40 mg, Oral, Q breakfast, Cirigliano, Vito V, DO, 40 mg at 03/08/22 0844   prochlorperazine (COMPAZINE) tablet 5-10 mg, 5-10 mg, Oral, Q6H PRN **OR** prochlorperazine (COMPAZINE) injection 5-10 mg, 5-10 mg, Intravenous, Q6H PRN **OR** prochlorperazine (COMPAZINE) suppository 12.5 mg, 12.5 mg, Rectal, Q6H PRN, Opyd, Ilene Qua, MD   Short Chain Fatty Acid enema, 1 enema, Rectal, QHS, Pierce, Dwayne A, RPH   simethicone (MYLICON) 40 FT/7.3UK suspension 80 mg, 80 mg, Oral, QID PRN, Opyd, Ilene Qua, MD   sodium chloride (OCEAN) 0.65 % nasal spray 1-2 spray, 1-2 spray, Each Nare, PRN, Opyd, Ilene Qua, MD   sodium chloride flush (NS) 0.9 % injection 10-40 mL, 10-40 mL, Intracatheter, Q12H, Opyd, Timothy S, MD, 10 mL at 03/07/22 2219   sodium chloride flush (NS) 0.9 % injection 10-40 mL, 10-40 mL, Intracatheter, PRN, Opyd, Ilene Qua, MD, 10 mL at 03/06/22 2330   sodium chloride flush (NS) 0.9 %  injection 3 mL, 3 mL, Intravenous, Q12H, Opyd, Timothy S, MD, 3 mL at 03/07/22 2219   thiamine (VITAMIN B1) tablet 100 mg, 100 mg, Oral, Daily, Opyd, Timothy S, MD, 100 mg at 03/08/22 0845   traZODone (DESYREL) tablet 25-50 mg, 25-50 mg, Oral, QHS PRN, Opyd, Ilene Qua, MD   zinc sulfate capsule 220 mg, 220 mg, Oral, Daily, Opyd, Ilene Qua, MD, 220 mg at 03/08/22 0846   Patients Current Diet:  Diet Order                  Diet regular Room service appropriate? Yes; Fluid consistency: Thin  Diet effective now                         Precautions / Restrictions Precautions Precautions: None Precaution Comments: L colostomy, monitor O2/HR Restrictions Weight Bearing Restrictions: No    Has the patient had 2 or more falls or a fall with injury in the past year? Yes   Prior Activity Level Community (5-7x/wk): pt. active in the community PTA   Prior Functional Level Self Care: Did the patient need help bathing, dressing, using the toilet or eating? Independent   Indoor Mobility: Did the patient need assistance with walking from room to room (with or without device)? Independent   Stairs: Did the patient need assistance with internal or external stairs (with or without device)? Independent   Functional Cognition: Did the patient need help planning regular tasks such as shopping or remembering to take medications? Independent   Patient Information Are you of Hispanic, Latino/a,or Spanish origin?: A. No, not of Hispanic, Latino/a, or Spanish origin What is your race?: A. White Do you need or want an interpreter to communicate with a doctor or health care staff?: 0. No   Patient's Response To:  Health Literacy and Transportation Is the patient able to respond to health literacy and transportation needs?: Yes Health Literacy - How often do you need to have someone help you when you read instructions, pamphlets, or other written material from your doctor or pharmacy?: Never In the  past 12 months, has lack of transportation  kept you from medical appointments or from getting medications?: No In the past 12 months, has lack of transportation kept you from meetings, work, or from getting things needed for daily living?: No   Home Assistive Devices / [place]: None   Prior Device Use: Indicate devices/aids used by the patient prior to current illness, exacerbation or injury? Walker   Current Functional Level Cognition   Overall Cognitive Status: Within Functional Limits for tasks assessed Orientation Level: Oriented X4 General Comments: Does not like alot of people in and out of his room and one time; it's too much at once.    Extremity Assessment (includes Sensation/Coordination)   Upper Extremity Assessment: Defer to OT evaluation  Lower Extremity Assessment: Generalized weakness RLE Sensation: history of peripheral neuropathy LLE Sensation: history of peripheral neuropathy     ADLs   Overall ADL's : Needs assistance/impaired Grooming: Oral care, Wash/dry face, Set up, Bed level Upper Body Bathing: Maximal assistance, Sitting Upper Body Bathing Details (indicate cue type and reason): Due to multiple lines and leads. Lower Body Bathing: Total assistance, Bed level Upper Body Dressing : Maximal assistance, Sitting Lower Body Dressing: Bed level, Total assistance Toilet Transfer:  (NT) Toileting- Clothing Manipulation and Hygiene: Total assistance, Bed level     Mobility   Overal bed mobility: Needs Assistance Bed Mobility: Supine to Sit, Sit to Supine Rolling: Max assist Supine to sit: Mod assist, +2 for physical assistance Sit to supine: Mod assist, +2 for physical assistance General bed mobility comments: Pt requires 2 person moderate assistance to go from supine<>sitting with assistance with bil LE and trunk with HOB elevated and heavy use of bed rails.     Transfers   Overall transfer level: Needs assistance Equipment used:   Charlaine Dalton) Transfers: Sit to/from Stand Sit to Stand: Mod assist, +2 physical assistance Transfer via Lift Equipment: Stedy General transfer comment: Pt stood at 2 person Mod A with EOB elevated to Stedy standing for ~5 min or greater while resting on steady seat for linen change.     Ambulation / Gait / Stairs / Wheelchair Mobility   Ambulation/Gait General Gait Details: Due to current functional status unable to attempt     Posture / Balance Dynamic Sitting Balance Sitting balance - Comments: initially pt requires physical assistance to steady himself but was able to progress to no UE support sitting EOB Balance Overall balance assessment: Needs assistance Sitting-balance support: No upper extremity supported, Feet supported Sitting balance-Leahy Scale: Fair Sitting balance - Comments: initially pt requires physical assistance to steady himself but was able to progress to no UE support sitting EOB Standing balance support: During functional activity, Reliant on assistive device for balance, Bilateral upper extremity supported Standing balance-Leahy Scale: Poor Standing balance comment: Utilized stedy to stand at [facility]     Special needs/care consideration Special service needs none    Previous Home Environment (from acute therapy documentation) Living Arrangements: Spouse/significant other  Lives With: Spouse Available Help at Discharge: Family Type of Home: House Home Layout: Two level Alternate Level Stairs-Rails: Left Alternate Level Stairs-Number of Steps: 15 down to his  office Home Access: Stairs to enter Entrance Stairs-Rails: Left Entrance Stairs-Number of Steps: 5 Bathroom Shower/Tub: Multimedia programmer: Standard Bathroom Accessibility: Yes How Accessible: Accessible via walker [place]: No   Discharge Living Setting Plans for Discharge Living Setting: Patient's home Type of Home at Discharge: House Discharge Home Layout: Two level, Able to live  on main level with bedroom/bathroom Alternate  Level Stairs-Rails: Can reach both Discharge Home Access: Stairs to enter Entrance Stairs-Rails: Can reach both Entrance Stairs-Number of Steps: 5 Discharge Bathroom Shower/Tub: Tub/shower unit Discharge Bathroom Toilet: Standard Discharge Bathroom Accessibility: No Does the patient have any problems obtaining your medications?: No   Social/Family/Support Systems Patient Roles: Spouse Contact Information: [phone] Anticipated Caregiver: wife Anticipated Caregiver's Contact Information: burt piatek Ability/Limitations of Caregiver: can [facility] Caregiver Availability: 24/7 Discharge Plan Discussed with Primary Caregiver: Yes Is Caregiver In Agreement with Plan?: Yes   Goals Patient/Family Goal for Rehab: PT/OT Supervision level Expected length of stay: 10-12 days   Decrease burden of Care through IP rehab admission: none    Possible need for SNF placement upon discharge:not anticipated   Patient Condition: I have reviewed medical records from [facility] , spoken with CM, and patient and spouse. I met with patient at the bedside for inpatient rehabilitation assessment.  Patient will benefit from ongoing PT and OT, can actively participate in 3 hours of therapy a day 5 days of the week, and can make measurable gains during the admission.  Patient will also benefit from the coordinated team approach during an Inpatient Acute Rehabilitation admission.  The patient will receive intensive therapy as well as Rehabilitation physician, nursing, social worker, and care management interventions.  Due to safety, skin/wound care, disease management, medication administration, pain management, and patient education the patient requires 24 hour a day rehabilitation nursing.  The patient is currently mod A+2 with mobility and basic ADLs.  Discharge setting and therapy post discharge at home with home health is anticipated.  Patient has agreed to  participate in the Acute Inpatient Rehabilitation Program and will admit today.   Preadmission Screen Completed By:  Genella Mech, 03/08/2022 10:47 AM ______________________________________________________________________   Discussed status with Dr. Ranell Patrick  on 03/08/22 at 42 and received approval for admission today.   Admission Coordinator:  Genella Mech, CCC-SLP, time 1045/Date 03/08/22    Assessment/Plan: Diagnosis: Debility Does the need for close, 24 hr/day Medical supervision in concert with the patient's rehab needs make it unreasonable for this patient to be served in a less intensive setting? Yes Co-Morbidities requiring supervision/potential complications: obesity, diarrhea, inflammation throughout the colon, perforated bowel, IBD Due to bladder management, bowel management, safety, skin/wound care, disease management, medication administration, pain management, and patient education, does the patient require 24 hr/day rehab nursing? Yes Does the patient require coordinated care of a physician, rehab nurse, PT, OT, and SLP to address physical and functional deficits in the context of the above medical diagnosis(es)? Yes Addressing deficits in the following areas: balance, endurance, locomotion, strength, transferring, bowel/bladder control, bathing, dressing, feeding, grooming, toileting, and psychosocial support Can the patient actively participate in an intensive therapy program of at least 3 hrs of therapy 5 days a week? Yes The potential for patient to make measurable gains while on inpatient rehab is excellent Anticipated functional outcomes upon discharge from inpatient rehab: supervision PT, supervision OT, independent and supervision SLP Estimated rehab length of stay to reach the above functional goals is: 10-14 days Anticipated discharge destination: Home 10. Overall Rehab/Functional Prognosis: excellent     MD Signature: Leeroy Cha, MD

## 2022-03-08 NOTE — Consult Note (Addendum)
[place] Nurse ostomy follow up Surgical team following for assessment and plan of care for abd wound.   Ostomy pouch change performed; he has downsized from 4 inch pouch to 2 3/4 inch pouching system.  Stoma is red and viable, above skin level, 2 1/4 inch. Pt watched and asked appropriate questions. Mild maceration to lower stoma edges. Peristomal assessment: intact Output: 50 cc liquid brown stool Ostomy pouching: 2pc.  Education provided:  Performed pouch change. Wife was able to stretch barrier ring to fit the opening and apply 2 piece pouching system, use supplies: pouch Kellie Simmering # 2 and [place] # [phone] skin barrier ring is Kellie Simmering # [id number].  5sets of supplies ordered to the bedside.  Pt and wife were able to open and close velcro to empty. Reviewed pouching routines and ordering supplies.  Enrolled patient in [place] Discharge program: Yes, previously.  Will order smaller pouching system today. [place] team will continue to follow next week for further teaching sessions.   Post note: requested to assess buttocks.  Left buttock with dark purple Deep tissue pressure injury; 1X2cm.  This was not noted as present on admission. Foam dressing to left buttock, change Q 3 days or PRN soiling WOC team will assess weekly to determine if a change in the plan of care is indicated at that time.  Thank-you,  Julien Girt MSN, [place], [place], [place], [place]

## 2022-03-09 DIAGNOSIS — R5381 Other malaise: Secondary | ICD-10-CM | POA: Diagnosis not present

## 2022-03-09 LAB — CBC WITH DIFFERENTIAL/PLATELET
Abs Immature Granulocytes: 0.12 10*3/uL — ABNORMAL HIGH (ref 0.00–0.07)
Basophils Absolute: 0 10*3/uL (ref 0.0–0.1)
Basophils Relative: 0 %
Eosinophils Absolute: 0.1 10*3/uL (ref 0.0–0.5)
Eosinophils Relative: 1 %
HCT: 27.9 % — ABNORMAL LOW (ref 39.0–52.0)
Hemoglobin: 9.1 g/dL — ABNORMAL LOW (ref 13.0–17.0)
Immature Granulocytes: 1 %
Lymphocytes Relative: 19 %
Lymphs Abs: 1.9 10*3/uL (ref 0.7–4.0)
MCH: 32.5 pg (ref 26.0–34.0)
MCHC: 32.6 g/dL (ref 30.0–36.0)
MCV: 99.6 fL (ref 80.0–100.0)
Monocytes Absolute: 0.7 10*3/uL (ref 0.1–1.0)
Monocytes Relative: 7 %
Neutro Abs: 7.4 10*3/uL (ref 1.7–7.7)
Neutrophils Relative %: 72 %
Platelets: 326 10*3/uL (ref 150–400)
RBC: 2.8 MIL/uL — ABNORMAL LOW (ref 4.22–5.81)
RDW: 17.2 % — ABNORMAL HIGH (ref 11.5–15.5)
WBC: 10.2 10*3/uL (ref 4.0–10.5)
nRBC: 0.3 % — ABNORMAL HIGH (ref 0.0–0.2)

## 2022-03-09 LAB — COMPREHENSIVE METABOLIC PANEL
ALT: 20 U/L (ref 0–44)
AST: 21 U/L (ref 15–41)
Albumin: 2.4 g/dL — ABNORMAL LOW (ref 3.5–5.0)
Alkaline Phosphatase: 101 U/L (ref 38–126)
Anion gap: 5 (ref 5–15)
BUN: 10 mg/dL (ref 8–23)
CO2: 30 mmol/L (ref 22–32)
Calcium: 8.9 mg/dL (ref 8.9–10.3)
Chloride: 103 mmol/L (ref 98–111)
Creatinine, Ser: 0.3 mg/dL — ABNORMAL LOW (ref 0.61–1.24)
GFR, Estimated: >60 mL/min (ref >60)
Glucose, Bld: 168 mg/dL — ABNORMAL HIGH (ref 70–99)
Potassium: 3.9 mmol/L (ref 3.5–5.1)
Sodium: 138 mmol/L (ref 135–145)
Total Bilirubin: 0.3 mg/dL (ref 0.3–1.2)
Total Protein: 5.9 g/dL — ABNORMAL LOW (ref 6.5–8.1)

## 2022-03-09 LAB — GLUCOSE, CAPILLARY
Glucose-Capillary: 150 mg/dL — ABNORMAL HIGH (ref 70–99)
Glucose-Capillary: 174 mg/dL — ABNORMAL HIGH (ref 70–99)
Glucose-Capillary: 289 mg/dL — ABNORMAL HIGH (ref 70–99)
Glucose-Capillary: 292 mg/dL — ABNORMAL HIGH (ref 70–99)

## 2022-03-09 LAB — MAGNESIUM: Magnesium: 1.4 mg/dL — ABNORMAL LOW (ref 1.7–2.4)

## 2022-03-09 MED ORDER — NONFORMULARY OR COMPOUNDED ITEM
1.0000 | Freq: Every day | Status: AC
  Administered 2022-03-10 – 2022-03-20 (×11): 1 via RECTAL
  Filled 2022-03-09 (×11): qty 1

## 2022-03-09 MED ORDER — MAGNESIUM SULFATE 2 GM/50ML IV SOLN
2.0000 g | Freq: Once | INTRAVENOUS | Status: AC
  Administered 2022-03-09: 2 g via INTRAVENOUS
  Filled 2022-03-09: qty 50

## 2022-03-09 NOTE — Evaluation (Signed)
Occupational Therapy Assessment and Plan  Patient Details  Name: Richard [name] MRN: [id number] Date of Birth: Apr 13, 1955  OT Diagnosis: muscle weakness (generalized) Rehab Potential: Rehab Potential (ACUTE ONLY): Good ELOS: 3 weeks   Today's Date: 03/09/2022 OT Individual Time: 9417-4081 OT Individual Time Calculation (min): 60 min     Hospital Problem: Principal Problem:   Debility   Past Medical History:  Past Medical History:  Diagnosis Date   Adult ADHD    Allergic rhinitis    Anxiety    claustrophobic   Bronchopneumonia 11/03/2013   Colon polyps 2012 and 2013   All hyperplastic except one adenomatous w/out high grade dysplasia (recall 2023 per Dr. Deatra Ina)   COVID-19 virus infection 09/29/2020   Elevated transaminase level 2017   Viral hep screening neg.  Suspect fatty liver.   Hyperlipidemia    Hypertension    Inflammatory bowel disease    01/2022   Large bowel perforation ([place]) 02/2022   delayed, s/p colonoscopy w/biopsies (new dx crohn's/inflammatory BD)   Obesity    OSA on CPAP    Severe.  CPAP 10 cm H2O.  Follows Dr. Ander Slade.   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 [name] to do surg   Pneumonia    Seborrheic dermatitis of scalp    and face: hydrocort 2% cream bid prn to face, and fluocinonide 0.5% sol'n to scalp bid prn per Dr. Allyson Sabal (04/2014)   Secondary male hypogonadism 04/2021   Type 2 diabetes mellitus with microalbuminuria ([place]) DM dx-09/27/2015. Microalb 2020   Past Surgical History:  Past Surgical History:  Procedure Laterality Date   COLONOSCOPY W/ POLYPECTOMY  04/2010; 10/2011   Initial screening TCS showed 6 polyps (one adenomatous).  Pt says he returned for repeat TCS 10/2011 showed one hyperplastic polyp: Recall 10 yrs per Dr. Samuella Cota SIGMOIDOSCOPY N/A 02/25/2022   Procedure: FLEXIBLE SIGMOIDOSCOPY;  Surgeon: Irving Copas., MD;  Location: [facility];  Service: Gastroenterology;  Laterality: N/A;    FLEXIBLE SIGMOIDOSCOPY N/A 03/04/2022   Procedure: FLEXIBLE SIGMOIDOSCOPY;  Surgeon: Lavena Bullion, DO;  Location: [facility];  Service: Gastroenterology;  Laterality: N/A;   HEMOSTASIS CONTROL  02/25/2022   Procedure: HEMOSTASIS CONTROL;  Surgeon: Irving Copas., MD;  Location: [facility];  Service: Gastroenterology;;   HEMOSTASIS CONTROL  03/04/2022   Procedure: HEMOSTASIS CONTROL;  Surgeon: Lavena Bullion, DO;  Location: [place] [facility];  Service: Gastroenterology;;   INCISION AND DRAINAGE ABSCESS N/A 11/07/2021   Procedure: Incision and drainage of scrotal abscess;  Surgeon: Leighton Ruff, MD;  Location: [facility];  Service: General;  Laterality: N/A;   IR SINUS/FIST TUBE CHK-NON GI  03/06/2022   LAPAROTOMY N/A 02/07/2022   Procedure: EXPLORATORY LAPAROTOMY, COLON RESECTION, OSTOMY;  Surgeon: Leighton Ruff, MD;  Location: [facility];  Service: General;  Laterality: N/A;   MOUTH SURGERY     TRANSTHORACIC ECHOCARDIOGRAM     03/05/22 NORMAL    Assessment & Plan Clinical Impression: Patient is a 66 year old male with history of T2DM, severe OSA, ADHD, HTN, scrotal abscess s/p I&D 08/23, recent diagnosis of IBD who was [facility] to [facility] with perforated bowel with peritonitis requiring removal emergent exploratory lap with mobilization splenic flexure, resection of descending and sigmoid colon with transverse colostomy by Dr. [name]. Hospital course was significant for ileus, encephalopathy due to PNA, abdominal distention with nausea requiring TNA as well as issues with fluid overload.  Patient transferred to CIR on 03/08/2022 .    Patient  currently requires max with basic self-care skills secondary to muscle weakness and decreased sitting balance and decreased standing balance.  Prior to hospitalization, patient could complete all self care and IADL's with independent .  Patient will benefit from skilled intervention to increase independence with basic self-care skills  prior to discharge home with care partner.  Anticipate patient will require intermittent supervision and follow up home health.  OT - End of Session Activity Tolerance: Tolerates 30+ min activity with multiple rests Endurance Deficit: Yes OT Assessment Rehab Potential (ACUTE ONLY): Good OT Patient demonstrates impairments in the following area(s): Balance;Edema;Endurance;Motor;Safety OT Basic ADL's Functional Problem(s): Bathing;Dressing;Toileting OT Advanced ADL's Functional Problem(s): Simple Meal Preparation OT Transfers Functional Problem(s): Toilet;Tub/Shower OT Plan OT Intensity: Minimum of 1-2 x/day, 45 to 90 minutes OT Frequency: 5 out of 7 days OT Duration/Estimated Length of Stay: 3 weeks OT Treatment/Interventions: Balance/vestibular training;Discharge planning;Self Care/advanced ADL retraining;Therapeutic Activities;Therapeutic Exercise;Patient/family education;Functional mobility training;Community reintegration;DME/adaptive equipment instruction;UE/LE Strength taining/ROM;Wheelchair propulsion/positioning OT Self Feeding Anticipated Outcome(s): Independent OT Basic Self-Care Anticipated Outcome(s): Mod I OT Toileting Anticipated Outcome(s): Min Assist with ostomy, OT Bathroom Transfers Anticipated Outcome(s): Mod I OT Recommendation Patient destination: Home Follow Up Recommendations: Home health OT Equipment Recommended: 3 in 1 bedside comode Equipment Details: TBD   OT Evaluation Precautions/Restrictions  Precautions Precautions: None Precaution Comments: L colostomy, monitor O2/HR Restrictions Weight Bearing Restrictions: No General Chart Reviewed: Yes Family/Caregiver Present: Yes Vital Signs Therapy Vitals Temp: 99 F (37.2 C) Pulse Rate: (!) 110 Resp: 16 BP: 129/76 Patient Position (if appropriate): Lying Oxygen Therapy SpO2: 91 % O2 Device: Room Air Pain Pain Assessment Pain Scale: 0-10 Pain Score: 0-No pain Home Living/Prior Functioning Home  Living Family/patient expects to be discharged to:: Private residence Living Arrangements: Spouse/significant other Available Help at Discharge: Family Type of Home: House Home Access: Stairs to enter Technical brewer of Steps: 5 Entrance Stairs-Rails: Can reach both Home Layout: Multi-level Alternate Level Stairs-Number of Steps: basement accessible by ramped sidewalk on the homes exterior or 15 interior steps. Bathroom Shower/Tub: Multimedia programmer: Standard Bathroom Accessibility: Yes Additional Comments: Patient would be able to use w/c  on main level, reports no small halls or doorways.  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 Prior Function Level of Independence: Independent with gait, Independent with homemaking with ambulation  Able to Take Stairs?: Reciprically Driving: Yes Vocation: Full time employment Vocation Requirements: Works from home Vision Baseline Vision/History: 1 Wears glasses Ability to See in Adequate Light: 1 Impaired Patient Visual Report: No change from baseline Perception  Perception: Within Functional Limits Praxis Praxis: Intact Cognition Cognition Overall Cognitive Status: Within Functional Limits for tasks assessed Arousal/Alertness: Awake/alert Orientation Level: Person;Place;Situation Person: Oriented Place: Oriented Situation: Oriented Memory: Appears intact Attention: Sustained;Focused Focused Attention: Appears intact Sustained Attention: Appears intact Awareness: Appears intact Problem Solving: Appears intact Safety/Judgment: Appears intact Brief Interview for Mental Status (BIMS) Repetition of Three Words (First Attempt): 3 Temporal Orientation: Year: Correct Temporal Orientation: Month: Accurate within 5 days Temporal Orientation: Day: Correct Recall: "Sock": Yes,  no cue required Recall: "Blue": Yes, no cue required Recall: "Bed": Yes, no cue required BIMS Summary Score: 15 Sensation Sensation Light Touch: Appears Intact Coordination Gross Motor Movements are Fluid and Coordinated: No Fine Motor Movements are Fluid and Coordinated: Yes Heel Shin Test: unable due to weakness, lack of effort5 Motor  Motor Motor: Within Functional Limits Motor - Skilled Clinical  Observations: decreased strength and endurance.  Trunk/Postural Assessment  Cervical Assessment Cervical Assessment: Within Functional Limits Thoracic Assessment Thoracic Assessment: Within Functional Limits Lumbar Assessment Lumbar Assessment: Exceptions to [facility] (posterior pelvic tilt.)  Balance Balance Balance Assessed: Yes Static Sitting Balance Static Sitting - Balance Support: Feet supported Static Sitting - Level of Assistance: 5: Stand by assistance Dynamic Sitting Balance Dynamic Sitting - Balance Support: Feet supported Dynamic Sitting - Level of Assistance: 5: Stand by assistance Extremity/Trunk Assessment RUE Assessment RUE Assessment: Within Functional Limits Active Range of Motion (AROM) Comments: full General Strength Comments: 3-/5 MMT LUE Assessment LUE Assessment: Within Functional Limits Active Range of Motion (AROM) Comments: full range General Strength Comments: 3/5MMT  Care Tool Care Tool Self Care Eating    Set-up at bed level    Oral Care  Set up sitting at sink  Bathing  Max assist            Upper Body Dressing(including orthotics)    Min assist donning gown        Lower Body Dressing (excluding footwear) Assist of two to don/manage pants  Putting on/Taking off footwear    Total assist         Care Tool Toileting Toileting activity    Dep     Care Tool Bed Mobility Roll left and right activity  Mod Assist      Sit to lying activity   Sit to lying assist level: Maximal Assistance - Patient 25 - 49%    Lying to sitting on  side of bed activity  Mod/Max assist with [facility] raised       Care Tool Transfers Sit to stand transfer Assist of two- Total      Chair/bed transfer Squat Pivot and slide board- Mod/Max depending on fatigue   Toilet transfer  No attempted due to safety       Care Tool Cognition  Expression of Ideas and Wants  intact  Understanding Verbal and Non-Verbal Content intact   Memory/Recall Ability intact   Refer to Care Plan for Long Term Goals  SHORT TERM GOAL WEEK 1 OT Short Term Goal 1 (Week 1): Patient will perform UB dressing with set up assist OT Short Term Goal 2 (Week 1): Patient will stand for clothing management with Mod assist of one with grab bar OT Short Term Goal 3 (Week 1): Patient will perform toilet transfer with Mod assist of one squat pivot OT Short Term Goal 4 (Week 1): Patient will be educated in use of AE for LE dressing and bathing OT Short Term Goal 5 (Week 1): Patient will perform LE bathing with Mod assist  Recommendations for other services: None    Skilled Therapeutic Intervention ADL ADL Eating: Independent Where Assessed-Eating: Bed level Grooming: Setup Where Assessed-Grooming: Sitting at sink Upper Body Bathing: Minimal assistance Where Assessed-Upper Body Bathing: Sitting at sink Lower Body Bathing: Maximal assistance Where Assessed-Lower Body Bathing: Sitting at sink Upper Body Dressing: Minimal assistance (Patient requesting hospital gown vs pull over shirt) Where Assessed-Upper Body Dressing: Wheelchair Lower Body Dressing: Dependent Where Assessed-Lower Body Dressing: Wheelchair Toileting: Dependent Where Assessed-Toileting: Bed level Tub/Shower Transfer: Not assessed Social research officer, government: Not assessed ADL Comments: Not needing to perform toilet transfer due to ostomy and urinal use at the time of eval Mobility  Bed Mobility Bed Mobility: Rolling Right;Rolling Left;Supine to Sit Rolling Right: Moderate Assistance - Patient  50-74% Rolling Left: Moderate Assistance - Patient 50-74% Left Sidelying to Sit: Moderate Assistance - Patient 50-74%  Supine to Sit: Moderate Assistance - Patient 50-74% Sit to Sidelying Right: Moderate Assistance - Patient 50-74%   Discharge Criteria: Patient will be discharged from OT if patient refuses treatment 3 consecutive times without medical reason, if treatment goals not met, if there is a change in medical status, if patient makes no progress towards goals or if patient is discharged from hospital.  The above assessment, treatment plan, treatment alternatives and goals were discussed and mutually agreed upon: by patient and by family  Hermina Barters 03/09/2022, 4:18 PM

## 2022-03-09 NOTE — Progress Notes (Signed)
Pt refused to wear CPAP tonight. ?

## 2022-03-09 NOTE — Consult Note (Signed)
[facility] Nurse Consult Note: Pt is familiar to [facility] team for DTPI to left buttock and ostomy care; refer to progress notes when patient was seen yesterday. [facility] team will continue to follow now that the patient is on the rehab unit.  Thank-you,  Julien Girt MSN, [place], [place], [place], [place]

## 2022-03-09 NOTE — Evaluation (Signed)
Physical Therapy Assessment and Plan  Patient Details  Name: Richard [name] MRN: [id number] Date of Birth: May 11, 1955  PT Diagnosis: Abnormal posture, Difficulty walking, and Muscle weakness Rehab Potential: Good ELOS: 3 weeks   Today's Date: 03/09/2022 PT Individual Time: 1446-1530 PT Individual Time Calculation (min): 44 min    Hospital Problem: Principal Problem:   Debility   Past Medical History:  Past Medical History:  Diagnosis Date   Adult ADHD    Allergic rhinitis    Anxiety    claustrophobic   Bronchopneumonia 11/03/2013   Colon polyps 2012 and 2013   All hyperplastic except one adenomatous w/out high grade dysplasia (recall 2023 per Dr. Deatra Ina)   COVID-19 virus infection 09/29/2020   Elevated transaminase level 2017   Viral hep screening neg.  Suspect fatty liver.   Hyperlipidemia    Hypertension    Inflammatory bowel disease    01/2022   Large bowel perforation ([place]) 02/2022   delayed, s/p colonoscopy w/biopsies (new dx crohn's/inflammatory BD)   Obesity    OSA on CPAP    Severe.  CPAP 10 cm H2O.  Follows Dr. Ander Slade.   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 [name] to do surg   Pneumonia    Seborrheic dermatitis of scalp    and face: hydrocort 2% cream bid prn to face, and fluocinonide 0.5% sol'n to scalp bid prn per Dr. Allyson Sabal (04/2014)   Secondary male hypogonadism 04/2021   Type 2 diabetes mellitus with microalbuminuria ([place]) DM dx-09/27/2015. Microalb 2020   Past Surgical History:  Past Surgical History:  Procedure Laterality Date   COLONOSCOPY W/ POLYPECTOMY  04/2010; 10/2011   Initial screening TCS showed 6 polyps (one adenomatous).  Pt says he returned for repeat TCS 10/2011 showed one hyperplastic polyp: Recall 10 yrs per Dr. Samuella Cota SIGMOIDOSCOPY N/A 02/25/2022   Procedure: FLEXIBLE SIGMOIDOSCOPY;  Surgeon: Irving Copas., MD;  Location: [facility];  Service: Gastroenterology;  Laterality: N/A;   FLEXIBLE  SIGMOIDOSCOPY N/A 03/04/2022   Procedure: FLEXIBLE SIGMOIDOSCOPY;  Surgeon: Lavena Bullion, DO;  Location: [facility];  Service: Gastroenterology;  Laterality: N/A;   HEMOSTASIS CONTROL  02/25/2022   Procedure: HEMOSTASIS CONTROL;  Surgeon: Irving Copas., MD;  Location: [facility];  Service: Gastroenterology;;   HEMOSTASIS CONTROL  03/04/2022   Procedure: HEMOSTASIS CONTROL;  Surgeon: Lavena Bullion, DO;  Location: [facility];  Service: Gastroenterology;;   INCISION AND DRAINAGE ABSCESS N/A 11/07/2021   Procedure: Incision and drainage of scrotal abscess;  Surgeon: Leighton Ruff, MD;  Location: [facility];  Service: General;  Laterality: N/A;   IR SINUS/FIST TUBE CHK-NON GI  03/06/2022   LAPAROTOMY N/A 02/07/2022   Procedure: EXPLORATORY LAPAROTOMY, COLON RESECTION, OSTOMY;  Surgeon: Leighton Ruff, MD;  Location: [facility];  Service: General;  Laterality: N/A;   MOUTH SURGERY     TRANSTHORACIC ECHOCARDIOGRAM     03/05/22 NORMAL    Assessment & Plan Clinical Impression: Richard [name] is a 66 year old male with history of T2DM, severe OSA, ADHD, HTN, scrotal abscess s/p I&D 08/23, recent diagnosis of IBD who was [facility] to [facility] on [facility]07[facility]22 with perforated bowel with peritonitis requiring removal emergent exploratory lap with mobilization splenic flexure, resection of descending and sigmoid colon with transverse colostomy by Dr. [name].  Hospital course was significant for ileus, encephalopathy due to PNA, abdominal distention with nausea requiring TNA as well as issues with fluid overload.  He developed anemia with rectal bleeding due to rectal  varices with multiple ulcers in the mid and distal rectum as well as repeat grade mid rectal ulcer that was treated with hemostat spray.  He was also found to have LLQ abscess positive for E coli and bacteroides species that was treated with drain placement and initiation of IV Zosyn and Flagyl per Dr. Alcario Drought input.     PT OT was  working with patient and CIR was recommended due to functional decline.  He was admitted to rehab on 03/01/2022 and continued to have issues with fluid overload as well as purulent drainage from his LLQ drain.  Cardiology was consulted and was started on IV diuresis.  He  had recurrent rectal bleeding due to multiple ulcers in distal colon tx with PuraSTAT gel by Dr. Ellis Savage. CBC showed stable H/H and colon mucosa has significantly improved on flex sig per GI. He had recurrent rectal bleeding that night and was transferred to acute hospital for closer monitoring.   He was started on trial of short chain fatty enema daily and Remicade administered 12/20 after discussion of risks v/s benefits. 2D echo showed 12/19 showed EF 60-65% with  no wall abnormality and trivial pericardial effusion. CT abdomen pelvis showed resolution of abscess therefore drain pulled 12/20  and he was transitioned to [facility] and Flagyl thru 12/26. Cardiology has been following for IV diuresis and has signed off. Hypomagnesemia has been due to poor intake/GI losses has been supplemented. He was transitioned from solumedrol to prednisone today with plans for 20 mg X 2 weeks followed by taper of 5 mg/wk. Rectal bleeding has resolved. Po intake has been good and he has been weaned off daytime oxygen.  PT/OT evaluations completed and CIR recommended due to ongoing issues with weakness and debility. No new complaints this morning.   Patient currently requires mod with mobility secondary to muscle weakness and decreased standing balance.  Prior to hospitalization, patient was independent  with mobility and lived with Spouse in a House home.  Home access is 5Stairs to enter.  Patient will benefit from skilled PT intervention to maximize safe functional mobility, minimize fall risk, and decrease caregiver burden for planned discharge home with 24 hour supervision.  Anticipate patient will benefit from follow up [place] at discharge.  PT - End of  Session Activity Tolerance: Tolerates 10 - 20 min activity with multiple rests Endurance Deficit: Yes PT Assessment Rehab Potential (ACUTE/IP ONLY): Good PT Barriers to Discharge: Inaccessible home environment;Home environment access/layout PT Barriers to Discharge Comments: STE, frustration w/ situation. PT Patient demonstrates impairments in the following area(s): Balance;Behavior;Skin Integrity;Edema;Endurance;Sensory PT Transfers Functional Problem(s): Bed Mobility;Bed to Chair;Car;Furniture PT Locomotion Functional Problem(s): Ambulation;Wheelchair Mobility;Stairs PT Plan PT Intensity: Minimum of 1-2 x/day ,45 to 90 minutes PT Frequency: 5 out of 7 days PT Duration Estimated Length of Stay: 3 weeks PT Treatment/Interventions: Ambulation/gait training;Discharge planning;Functional mobility training;Therapeutic Activities;Psychosocial support;Balance/vestibular training;Neuromuscular re-education;Skin care/wound management;Therapeutic Exercise;Wheelchair propulsion/positioning;Pain management;UE/LE Strength taining/ROM;Community reintegration;Patient/family education;Stair training;UE/LE Coordination activities PT Transfers Anticipated Outcome(s): CGA PT Locomotion Anticipated Outcome(s): CGA PT Recommendation Follow Up Recommendations: Home health PT Patient destination: Home Equipment Recommended: To be determined   PT Evaluation Precautions/Restrictions Precautions Precautions: None Precaution Comments: L colostomy, monitor O2/HR Restrictions Weight Bearing Restrictions: No General Chart Reviewed: Yes Family/Caregiver Present: Yes Vital SignsTherapy Vitals Temp: 99 F (37.2 C) Pulse Rate: (!) 110 Resp: 16 BP: 129/76 Patient Position (if appropriate): Lying Oxygen Therapy SpO2: 91 % O2 Device: Room Air Pain Pain Assessment Pain Scale: 0-10 Pain Score: 0-No pain Pain Interference Pain  Interference Pain Effect on Sleep: 1. Rarely or not at all Pain Interference  with Therapy Activities: 2. Occasionally Pain Interference with Day-to-Day Activities: 2. Occasionally Home Living/Prior Functioning Home Living Available Help at Discharge: Family Type of Home: House Home Access: Stairs to enter Technical brewer of Steps: 5 Entrance Stairs-Rails: Can reach both Home Layout: Multi-level Alternate Level Stairs-Number of Steps: basement accessible by ramped sidewalk on the homes exterior or 15 interior steps. Bathroom Shower/Tub: Multimedia programmer: Standard Bathroom Accessibility: Yes Additional Comments: Patient would be able to use w/c  on main level, reports no small halls or doorways.  Lives With: Spouse Prior Function Level of Independence: Independent with gait;Independent with homemaking with ambulation  Able to Take Stairs?: Reciprically Driving: Yes Vocation: Full time employment Vocation Requirements: Works from home Vision/Perception     Cognition Overall Cognitive Status: Within Functional Limits for tasks assessed Orientation Level: Oriented X4 Safety/Judgment: Appears intact Sensation Sensation Light Touch: Appears Intact (although states neuropathy B LE s.) Coordination Gross Motor Movements are Fluid and Coordinated: No Heel Shin Test: unable due to weakness, lack of effort5 Motor  Motor Motor: Within Functional Limits Motor - Skilled Clinical Observations: decreased strength and endurance.   Trunk/Postural Assessment  Cervical Assessment Cervical Assessment: Within Functional Limits Thoracic Assessment Thoracic Assessment: Within Functional Limits Lumbar Assessment Lumbar Assessment: Exceptions to [facility] (posterior pelvic tilt.)  Balance Balance Balance Assessed: Yes Static Sitting Balance Static Sitting - Balance Support: Feet supported Static Sitting - Level of Assistance: 5: Stand by assistance Dynamic Sitting Balance Dynamic Sitting - Balance Support: Feet supported Dynamic Sitting - Level of  Assistance: 5: Stand by assistance Extremity Assessment      RLE Assessment RLE Assessment: Exceptions to [facility] General Strength Comments: hip grossly 2+/5, knee 4/5 LLE Assessment LLE Assessment: Exceptions to [facility] General Strength Comments: hip grossly 2+/5, knee 4/5  Care Tool Care Tool Bed Mobility Roll left and right activity        Sit to lying activity   Sit to lying assist level: Maximal Assistance - Patient 25 - 49%    Lying to sitting on side of bed activity         Care Tool Transfers Sit to stand transfer        Chair/bed transfer         Toilet transfer        Car transfer          Care Tool Locomotion Ambulation          Walk 10 feet activity         Walk 50 feet with 2 turns activity        Walk 150 feet activity        Walk 10 feet on uneven surfaces activity        Stairs          Walk up/down 1 step activity        Walk up/down 4 steps activity        Walk up/down 12 steps activity        Pick up small objects from floor        Wheelchair            Wheel 50 feet with 2 turns activity      Wheel 150 feet activity        Refer to Care Plan for Long Term Goals  SHORT TERM GOAL WEEK 1 PT Short Term Goal 1 (Week 1): pt  will perform supine to/from sit w/mod assist of 1 PT Short Term Goal 2 (Week 1): pt will perform sit to stand from elevated surface w/mod assist of 2 PT Short Term Goal 3 (Week 1): Pt will transfer bed <> w/c w/ min A PT Short Term Goal 4 (Week 1): Pt will negotiate w/c 67' w/ supervision PT Short Term Goal 5 (Week 1): PT will assess gait.  Recommendations for other services: None   Skilled Therapeutic Intervention Evaluation completed (see details above and below) with education on PT POC and goals and individual treatment initiated with focus on  strengthening, balance, endurance, transfers, progress to gait.   First session:  Pt presents sitting in w/c and reluctantly agreeable to therapy.  Pt  attempted sit to stand from w/c to RW w/ max A but unable.  Pt attempted sit to stand w/ PT facing w/ max A but unable.  Pt required total A for SB placement including lifting R LE and then max A for sequential transfer w/c > bed.  Pt fearful of falling.  Pt rolled in bed w/ mod to min A to complete pulling pants over hips.  Pt transferred sidelying to sit w/ mod A and then education on sequencing of log roll to improve independence (pt attempted transfer before education given and PT ready).  Pt able to lean and PT still required total A to place SB, but performed SB transfer w/ min A (downhill bias) and increased confidence.  Pt wheeled around bed to opposite side w/ supervision and verbal cues.  Pt remained sitting in w/c w/ all needs in reach, spouse present.  Second session:  Pt presents supine in bed asleep and does startle when called.  Pt states increased fatigue but encouraged to perform LE there ex.  Pt performed AP, HS, abd/add, SAQ in between encouragement and discussion of progress and rapport-building.  Pt remained supine in bed w/ all needs in reach.    Mobility Bed Mobility Bed Mobility: Rolling Right;Rolling Left;Left Sidelying to Sit;Sit to Sidelying Right Rolling Right: Moderate Assistance - Patient 50-74% (use of siderail) Rolling Left: Moderate Assistance - Patient 50-74% (use of siderail) Left Sidelying to Sit: Moderate Assistance - Patient 50-74% Sit to Sidelying Right: Moderate Assistance - Patient 50-74% Transfers Transfers: Transfer (SB) Transfer (Assistive device): Other (Comment) (Slide board.  Attempted SPT or squat pivot w/c > bed but unable 2/2 weakness.) Locomotion  Gait Ambulation: No Stairs / Additional Locomotion Stairs: No Architect: Yes Wheelchair Assistance: Chartered loss adjuster: Both upper extremities Wheelchair Parts Management: Needs assistance Distance: 10 (in room)   Discharge Criteria:  Patient will be discharged from PT if patient refuses treatment 3 consecutive times without medical reason, if treatment goals not met, if there is a change in medical status, if patient makes no progress towards goals or if patient is discharged from hospital.  The above assessment, treatment plan, treatment alternatives and goals were discussed and mutually agreed upon: by patient and by family  Ladoris Gene 03/09/2022, 4:04 PM

## 2022-03-09 NOTE — Progress Notes (Signed)
PROGRESS NOTE   Subjective/Complaints: He does not like that enema was offered to him at 10pm last night- changed to 4pm today after therapy, he prefers this time to after dinner No other complaints  ROS: +pressure injury  Objective:   No results found. Recent Labs    03/08/22 0430 03/09/22 0450  WBC 9.2 10.2  HGB 8.0* 9.1*  HCT 24.8* 27.9*  PLT 291 326   Recent Labs    03/08/22 0430 03/09/22 0450  NA 138 138  K 4.0 3.9  CL 102 103  CO2 29 30  GLUCOSE 262* 168*  BUN 12 10  CREATININE 0.51* 0.30*  CALCIUM 8.9 8.9    Intake/Output Summary (Last 24 hours) at 03/09/2022 0948 Last data filed at 03/09/2022 0741 Gross per 24 hour  Intake 720 ml  Output 1990 ml  Net -1270 ml     Pressure Injury 03/08/22 Buttocks Left Deep Tissue Pressure Injury - Purple or maroon localized area of discolored intact skin or blood-filled blister due to damage of underlying soft tissue from pressure and/or shear. (Active)  03/08/22   Location: Buttocks  Location Orientation: Left  Staging: Deep Tissue Pressure Injury - Purple or maroon localized area of discolored intact skin or blood-filled blister due to damage of underlying soft tissue from pressure and/or shear.  Wound Description (Comments):   Present on Admission: No    Physical Exam: Vital Signs Blood pressure 129/75, pulse 86, temperature 98.7 F (37.1 C), temperature source Oral, resp. rate 16, SpO2 98 %. Gen: no distress, normal appearing HEENT: oral mucosa pink and moist, NCAT Cardio: Reg rate Chest: normal effort, normal rate of breathing Abd: soft, non-distended Ext: no edema Psych: pleasant, normal affect Skin: left buttocks deep tissue injury Neuro: 5/5 strength BUE, 4/5 BLE   Assessment/Plan: 1. Functional deficits which require 3+ hours per day of interdisciplinary therapy in a comprehensive inpatient rehab setting. Physiatrist is providing close team  supervision and 24 hour management of active medical problems listed below. Physiatrist and rehab team continue to assess barriers to discharge/monitor patient progress toward functional and medical goals  Care Tool:  Bathing    Body parts bathed by patient: Right arm, Left arm, Chest, Abdomen, Right upper leg, Left upper leg, Face         Bathing assist       Upper Body Dressing/Undressing Upper body dressing   What is the patient wearing?: Hospital gown only    Upper body assist      Lower Body Dressing/Undressing Lower body dressing            Lower body assist Assist for lower body dressing: Total Assistance - Patient < 25%     Toileting Toileting    Toileting assist Assist for toileting: Total Assistance - Patient < 25%     Transfers Chair/bed transfer  Transfers assist     Chair/bed transfer assist level: (P) Moderate Assistance - Patient 50 - 74%     Locomotion Ambulation   Ambulation assist              Walk 10 feet activity   Assist  Walk 50 feet activity   Assist           Walk 150 feet activity   Assist           Walk 10 feet on uneven surface  activity   Assist           Wheelchair     Assist               Wheelchair 50 feet with 2 turns activity    Assist            Wheelchair 150 feet activity     Assist          Blood pressure 129/75, pulse 86, temperature 98.7 F (37.1 C), temperature source Oral, resp. rate 16, SpO2 98 %.  Medical Problem List and Plan: 1. Functional deficits secondary to debility             -patient may shower             -ELOS/Goals: 10-14 days S             Initial CIR therapies today 2.  Antithrombotics: -DVT/anticoagulation:  Mechanical: Sequential compression devices, below knee Bilateral lower extremities             -antiplatelet therapy: N/A 3. Pain Management: tylenol prn 4. Mood/Behavior/Sleep: LCSW to follow for  evaluation and support.              -antipsychotic agents: N/A 5. Neuropsych/cognition: This patient is capable of making decisions on his own behalf. 6. Skin/Wound Care: Wet to dry dressing changes daily to midline.              --continue Zinc. Add vitamin C  7. Fluids/Electrolytes/Nutrition: Monitor I/O. Check CMET in am             -- 8. Sigmoid perforation s/p resection w/abscess: Continue Duricef and Flagyl thru 12/26 9. Rectal ulcers:  Continue short chain fatty acid edemas daily, changed timing to 4pm as per patient's preferences. Please find out from GI how long these should continue 10. Crohn's disease: Remicade given 12/20 and repeat dose in 2 weeks per GI             --continue prednisone 40 mg X 2 weeks followed by 5 mg/week taper.   11. T2DM: Hgb A1C-6.1 and BS poorly controlled due to steroids/immobility.             --will monitor BS ac/hs and titrate insulin as indicated.             --continue Levemir 14 units with  6 units novolog for meal coverage.  12. Fluid overload/Pleural effusions: Daily wts with monitoring for overload             --intake improved and may need to add Low salt restrictions. Asked nursing to weigh today 13. Hypomagnesemia: Receive IV supplementation 12/20 and 12/21.              --Mg 1.3-->1.5 and till low. Will add oral supplement.  12/23: Mag is 1.4, ordered 2grams IV supplement 14. Severe OSA: Discussed need for CPAP/oxygen does not treat underling issues             --declines CPAP "too much at this time/can't lie on the left"      LOS: 1 days A FACE TO FACE EVALUATION WAS PERFORMED  Richard [name] 03/09/2022, 9:48 AM

## 2022-03-09 NOTE — Plan of Care (Signed)
  Problem: RH Dressing Goal: LTG Patient will perform upper body dressing (OT) Description: LTG Patient will perform upper body dressing with Set up assist, without cues (OT). Flowsheets (Taken 03/09/2022 1638) LTG: Pt will perform upper body dressing with assistance level of: Set up assist Goal: LTG Patient will perform lower body dressing w/assist (OT) Description: LTG: Patient will perform lower body dressing with Min assist, with cues in positioning using equipment (OT) Flowsheets (Taken 03/09/2022 1638) LTG: Pt will perform lower body dressing with assistance level of: Contact Guard/Touching assist   Problem: RH Toileting Goal: LTG Patient will perform toileting task (3/3 steps) with assistance level (OT) Description: LTG: Patient will perform toileting task (3/3 steps) with Stand by assistance level (OT)  Flowsheets (Taken 03/09/2022 1638) LTG: Pt will perform toileting task (3/3 steps) with assistance level: Minimal Assistance - Patient > 75%   Problem: RH Toilet Transfers Goal: LTG Patient will perform toilet transfers w/assist (OT) Description: LTG: Patient will perform toilet transfers with stand by assist, with cues using equipment (OT) Flowsheets (Taken 03/09/2022 1638) LTG: Pt will perform toilet transfers with assistance level of: Supervision/Verbal cueing   Problem: RH Tub/Shower Transfers Goal: LTG Patient will perform tub/shower transfers w/assist (OT) Description: LTG: Patient will perform shower transfers with contact guard assist, with cues using equipment (OT) Flowsheets (Taken 03/09/2022 1638) LTG: Pt will perform tub/shower stall transfers with assistance level of: Supervision/Verbal cueing LTG: Pt will perform tub/shower transfers from: Walk in shower   Problem: RH Bathing Goal: LTG Patient will bathe all body parts with assist levels (OT) Description: LTG: Patient will bathe all body parts with assist levels (OT) Flowsheets (Taken 03/09/2022 1638) LTG: Pt  will perform bathing with assistance level/cueing: Supervision/Verbal cueing LTG: Position pt will perform bathing: Shower

## 2022-03-10 DIAGNOSIS — R5381 Other malaise: Secondary | ICD-10-CM | POA: Diagnosis not present

## 2022-03-10 DIAGNOSIS — K50118 Crohn's disease of large intestine with other complication: Secondary | ICD-10-CM | POA: Diagnosis not present

## 2022-03-10 LAB — GLUCOSE, CAPILLARY
Glucose-Capillary: 127 mg/dL — ABNORMAL HIGH (ref 70–99)
Glucose-Capillary: 185 mg/dL — ABNORMAL HIGH (ref 70–99)
Glucose-Capillary: 329 mg/dL — ABNORMAL HIGH (ref 70–99)
Glucose-Capillary: 254 mg/dL — ABNORMAL HIGH (ref 70–99)

## 2022-03-10 LAB — MAGNESIUM: Magnesium: 1.4 mg/dL — ABNORMAL LOW (ref 1.7–2.4)

## 2022-03-10 MED ORDER — METOPROLOL TARTRATE 12.5 MG HALF TABLET
12.5000 mg | ORAL_TABLET | Freq: Once | ORAL | Status: AC
  Administered 2022-03-10: 12.5 mg via ORAL
  Filled 2022-03-10: qty 1

## 2022-03-10 NOTE — Progress Notes (Signed)
Occupational Therapy Session Note  Patient Details  Name: Richard [name] MRN: [id number] Date of Birth: 1955/11/07  Today's Date: 03/10/2022 OT Individual Time: 1102-1200 OT Individual Time Calculation (min): 58 min    Short Term Goals: Week 1:  OT Short Term Goal 1 (Week 1): Patient will perform UB dressing with set up assist OT Short Term Goal 2 (Week 1): Patient will stand for clothing management with Mod assist of one with grab bar OT Short Term Goal 3 (Week 1): Patient will perform toilet transfer with Mod assist of one squat pivot OT Short Term Goal 4 (Week 1): Patient will be educated in use of AE for LE dressing and bathing OT Short Term Goal 5 (Week 1): Patient will perform LE bathing with Mod assist  Skilled Therapeutic Interventions/Progress Updates:    Patient received supine in bed.  Patient requesting to not have external catheter removed but disconnected.  Patient feels he is aware enough during the day that he can move about without a brief on with external catheter in place.  Patient expressing multiple frustrations, and needing to have areas of control.  Patient encouraged to sit at edge of bed.  Patient able to roll toward right and sit up with only intermittent assistance.  Patient states - I need to go the other way - I get out of bed on the other side.  Encouraged patient to move to edge of bed toward right as this also beneficial for general strengthening and endurance.  While seated at edge of bed able to don pull over shirt and shorts.  Unable to stand from elevated bed to pull up pants.  Reviewed other options, and patient chose sit to stand with STEDY.  Once in standing in [place], patient able to let go with left hand to pull pants over hips.  Patient admits both fear and extreme LE weakness.  Patient also reports claustrophobia so prefers staff not too close when assisting.   Worked on partial stand to stand and stand to sit with controlled ascent/descent.   Transported  to gym to address level surface trasnfers.  Transferred L/R via multi scoot pivot method and increased time.  Patient with significant improved performance when given time to move himself.  Transferred to / from mat with min assist and cueing.  Worked on Public affairs consultant, and loading feet in prep for squat or stand.  Returned patient to room.  Opted to stay up for lunch.  Call bell and personal items in reach.  Wife at bedside.    Therapy Documentation Precautions:  Precautions Precautions: None Precaution Comments: L colostomy, monitor O2/HR Restrictions Weight Bearing Restrictions: No  Pain:  Denies pain   Therapy/Group: Individual Therapy  Mariah Milling 03/10/2022, 12:44 PM

## 2022-03-10 NOTE — Progress Notes (Signed)
PROGRESS NOTE   Subjective/Complaints: Wife and patient upset that enema was offered around dinner time rather than 4pm as we had discussed Wife upset that ostomy was overflowing when she saw him  ROS: +pressure injury, +overflowing ostomy bag  Objective:   No results found. Recent Labs    03/08/22 0430 03/09/22 0450  WBC 9.2 10.2  HGB 8.0* 9.1*  HCT 24.8* 27.9*  PLT 291 326   Recent Labs    03/08/22 0430 03/09/22 0450  NA 138 138  K 4.0 3.9  CL 102 103  CO2 29 30  GLUCOSE 262* 168*  BUN 12 10  CREATININE 0.51* 0.30*  CALCIUM 8.9 8.9    Intake/Output Summary (Last 24 hours) at 03/10/2022 1522 Last data filed at 03/10/2022 1315 Gross per 24 hour  Intake 960 ml  Output 2650 ml  Net -1690 ml     Pressure Injury 03/08/22 Buttocks Left Deep Tissue Pressure Injury - Purple or maroon localized area of discolored intact skin or blood-filled blister due to damage of underlying soft tissue from pressure and/or shear. Present on admission to rehab (Active)  03/08/22   Location: Buttocks  Location Orientation: Left  Staging: Deep Tissue Pressure Injury - Purple or maroon localized area of discolored intact skin or blood-filled blister due to damage of underlying soft tissue from pressure and/or shear.  Wound Description (Comments): Present on admission to rehab  Present on Admission: Yes    Physical Exam: Vital Signs Blood pressure 121/70, pulse (!) 115, temperature 98.8 F (37.1 C), temperature source Oral, resp. rate 17, weight 117.3 kg, SpO2 94 %. Gen: no distress, normal appearing HEENT: oral mucosa pink and moist, NCAT Cardio: Tachycardic Chest: normal effort, normal rate of breathing Abd: soft, non-distended Ext: no edema Psych: pleasant, normal affect Skin: left buttocks deep tissue injury Neuro: 5/5 strength BUE, 4/5 BLE   Assessment/Plan: 1. Functional deficits which require 3+ hours per day of  interdisciplinary therapy in a comprehensive inpatient rehab setting. Physiatrist is providing close team supervision and 24 hour management of active medical problems listed below. Physiatrist and rehab team continue to assess barriers to discharge/monitor patient progress toward functional and medical goals  Care Tool:  Bathing    Body parts bathed by patient: Right arm, Left arm, Chest, Abdomen, Right upper leg, Left upper leg, Face         Bathing assist       Upper Body Dressing/Undressing Upper body dressing   What is the patient wearing?: Hospital gown only    Upper body assist      Lower Body Dressing/Undressing Lower body dressing            Lower body assist Assist for lower body dressing: Total Assistance - Patient < 25%     Toileting Toileting    Toileting assist Assist for toileting: Total Assistance - Patient < 25%     Transfers Chair/bed transfer  Transfers assist     Chair/bed transfer assist level: Moderate Assistance - Patient 50 - 74%     Locomotion Ambulation   Ambulation assist   Ambulation activity did not occur: Safety/medical concerns  Walk 10 feet activity   Assist  Walk 10 feet activity did not occur: Safety/medical concerns        Walk 50 feet activity   Assist Walk 50 feet with 2 turns activity did not occur: Safety/medical concerns         Walk 150 feet activity   Assist Walk 150 feet activity did not occur: Safety/medical concerns         Walk 10 feet on uneven surface  activity   Assist Walk 10 feet on uneven surfaces activity did not occur: Safety/medical concerns         Wheelchair     Assist Is the patient using a wheelchair?: Yes Type of Wheelchair: Manual    Wheelchair assist level: Supervision/Verbal cueing Max wheelchair distance: 75 ft    Wheelchair 50 feet with 2 turns activity    Assist        Assist Level: Supervision/Verbal cueing   Wheelchair 150  feet activity     Assist          Blood pressure 121/70, pulse (!) 115, temperature 98.8 F (37.1 C), temperature source Oral, resp. rate 17, weight 117.3 kg, SpO2 94 %.  Medical Problem List and Plan: 1. Functional deficits secondary to debility             -patient may shower             -ELOS/Goals: 10-14 days S             Initial CIR therapies today 2.  Antithrombotics: -DVT/anticoagulation:  Mechanical: Sequential compression devices, below knee Bilateral lower extremities             -antiplatelet therapy: N/A 3. Pain Management: tylenol prn 4. Mood/Behavior/Sleep: LCSW to follow for evaluation and support.              -antipsychotic agents: N/A 5. Neuropsych/cognition: This patient is capable of making decisions on his own behalf. 6. Skin/Wound Care: Wet to dry dressing changes daily to midline.              --continue Zinc. Add vitamin C  7. Fluids/Electrolytes/Nutrition: Monitor I/O. Check CMET in am             -- 8. Sigmoid perforation s/p resection w/abscess: Continue Duricef and Flagyl thru 12/26 9. Rectal ulcers:  Continue short chain fatty acid edemas daily, changed timing to 4pm as per patient's preferences. Please find out from GI how long these should continue 10. Crohn's disease: Remicade given 12/20 and repeat dose in 2 weeks per GI             --continue prednisone 40 mg X 2 weeks followed by 5 mg/week taper.   11. T2DM: Hgb A1C-6.1 and BS poorly controlled due to steroids/immobility.             --will monitor BS ac/hs and titrate insulin as indicated.             --continue Levemir 14 units with  6 units novolog for meal coverage.  12. Fluid overload/Pleural effusions: Daily wts with monitoring for overload             --intake improved and may need to add Low salt restrictions. Weight from 12/23 reviewed and is down 13. Hypomagnesemia: Receive IV supplementation 12/20 and 12/21.              --Mg 1.3-->1.5 and till low. Will add oral  supplement.  12/23: Mag is 1.4,  ordered 2grams IV supplement  12/24: repeat magnesium ordered today 14. Severe OSA: Discussed need for CPAP/oxygen does not treat underling issues             --declines CPAP "too much at this time/can't lie on the left" 15. Tachycardic: EKG ordered and shows sinus tachycardia.       LOS: 2 days A FACE TO FACE EVALUATION WAS PERFORMED  Clide Deutscher Avagrace Botelho 03/10/2022, 3:22 PM

## 2022-03-10 NOTE — Progress Notes (Signed)
   03/10/22 1330  Assess: MEWS Score  Temp 98.9 F (37.2 C)  BP 119/70  MAP (mmHg) 84  Pulse Rate (!) 121  Resp 17  Level of Consciousness Alert  SpO2 93 %  O2 Device Room Air  Assess: MEWS Score  MEWS Temp 0  MEWS Systolic 0  MEWS Pulse 2  MEWS RR 0  MEWS LOC 0  MEWS Score 2  MEWS Score Color Yellow  Treat  Pain Scale 0-10  Pain Score 0  Complains of Other (Comment) (denies)  Take Vital Signs  Increase Vital Sign Frequency  Yellow: Q 2hr X 2 then Q 4hr X 2, if remains yellow, continue Q 4hrs  Escalate  MEWS: Escalate Yellow: discuss with charge nurse/RN and consider discussing with provider and RRT  Notify: Charge Nurse/RN  Name of Charge Nurse/RN Notified Angelina RN  Date Charge Nurse/RN Notified 03/10/22  Time Charge Nurse/RN Notified 1346  Provider Notification  Provider Name/Title Dr. Ranell Patrick  Date Provider Notified 03/10/22  Time Provider Notified 1346  Method of Notification Call  Notification Reason Change in status  Provider response Other (Comment) (EKG and Lopressor order by MD)  Date of Provider Response 03/10/22  Time of Provider Response 1346  Notify: Rapid Response  Name of Rapid Response RN Notified n/a  Document  Patient Outcome Other (Comment) (patient stayed on unit;orders noted)  Progress note created (see row info) Yes  Assess: SIRS CRITERIA  SIRS Temperature  0  SIRS Pulse 1  SIRS Respirations  0  SIRS WBC 0  SIRS Score Sum  1

## 2022-03-10 NOTE — Progress Notes (Signed)
Physical Therapy Session Note  Patient Details  Name: JEQUAN SHAHIN MRN: [id number] Date of Birth: 1955-06-14  Today's Date: 03/10/2022 PT Individual Time: 0805-0900, 1348-1450  PT Individual Time Calculation (min): 55 min, 62 min   Short Term Goals: Week 1:  PT Short Term Goal 1 (Week 1): pt will perform supine to/from sit w/mod assist of 1 PT Short Term Goal 2 (Week 1): pt will perform sit to stand from elevated surface w/mod assist of 2 PT Short Term Goal 3 (Week 1): Pt will transfer bed <> w/c w/ min A PT Short Term Goal 4 (Week 1): Pt will negotiate w/c 56' w/ supervision PT Short Term Goal 5 (Week 1): PT will assess gait.  Skilled Therapeutic Interventions/Progress Updates:      Therapy Documentation Precautions:  Precautions Precautions: None Precaution Comments: L colostomy, monitor O2/HR Restrictions Weight Bearing Restrictions: No  Treatment Session 1:  Pt agreeable to PT session and without verbal complaints of pain in session. Pt requires supervision with supine to sit to the left side with increased time and use of bed features (HOB elevated and rails). Pt agreeable to trial squat pivot transfer and requires mod A with verbal cues for sequencing and hand placment. Pt performed additional sit to stand with max A from w/c to place pad on w/c. PT replaced 18 x 16 w/c with 20 x 18 for comfort. Pt requires supervision for self-care test at sink. Pt propelled w/c ~75 ft with supervision for safety to increase UE activity tolerance. Pt required max A with squat pivot  to return to bed and min A for sit to lying. Pt left semi-reclined in bed with all needs in reach and alarm on.   Treatment Session 2:  Pt agreeable to PT session and without verbal complaints of pain in session. Pt transported total A for time management and energy conservation outside for community integration to increase mood. PT engaged in discharge discussion with patient and spouse regarding house set-up, pt  goals, caregiver expectations and DME. Pt lives in a tri level house with 5 steps to enter with 2 HR's from garage to main level (bed, bath, kitchen, living space). Pt does not access upper level of home and his office space is located in the basement. Pt can access basement with level entry outside by ~50 ft pathway. Pt reports the distance of his house is 45 feet and pt discussed goals of being ambulatory and able to navigate steps with decreased assistance upon discharge. PT discussed w/c level and transfer goals as well and pt/spouse are in agreement. Pt reports he would prefer to have two wheelchairs for various levels of his house and PT to recommend 20 x 18 w/c. Pt reports he owns a RW and secondary w/c from his father. Pt in good spirits at end of session and propelled w/c ~30 ft remaining distance to room and requires mod A with squat pivot and min A for sit to lying. Pt left semi-reclined in bed with all needs in reach and alarm on.   Therapy/Group: Individual Therapy  Verl Dicker Verl Dicker PT, DPT  03/10/2022, 7:47 AM

## 2022-03-10 NOTE — Progress Notes (Signed)
[facility] ROUNDING NOTE   Subjective: Was up moving around with OT and PT today.  Was able to get outside in wheelchair and sit in the sun for a bit, which felt great for him.  Otherwise tolerating p.o. intake.  No bleeding.  Tolerating enemas well.   Objective: Vital signs in last 24 hours: Temp:  [98.5 F (36.9 C)-98.9 F (37.2 C)] 98.8 F (37.1 C) (12/24 1505) Pulse Rate:  [103-121] 115 (12/24 1505) Resp:  [17-20] 17 (12/24 1505) BP: (119-134)/(70-90) 121/70 (12/24 1505) SpO2:  [93 %-95 %] 94 % (12/24 1505) Weight:  [117.3 kg] 117.3 kg (12/23 1700) Last BM Date : 03/10/22 (colostomy) General: NAD    Intake/Output from previous day: 12/23 0701 - 12/24 0700 In: 720 [P.O.:720] Out: 2650 [Urine:2650] Intake/Output this shift: Total I/O In: 480 [P.O.:480] Out: -    Lab Results: Recent Labs    03/08/22 0430 03/09/22 0450  WBC 9.2 10.2  HGB 8.0* 9.1*  PLT 291 326  MCV 100.4* 99.6   BMET Recent Labs    03/08/22 0430 03/09/22 0450  NA 138 138  K 4.0 3.9  CL 102 103  CO2 29 30  GLUCOSE 262* 168*  BUN 12 10  CREATININE 0.51* 0.30*  CALCIUM 8.9 8.9   LFT Recent Labs    03/09/22 0450  PROT 5.9*  ALBUMIN 2.4*  AST 21  ALT 20  ALKPHOS 101  BILITOT 0.3   PT/INR No results for input(s): "INR" in the last 72 hours.    Imaging/Other results: No results found.    Assessment and Plan:  1.  Crohn's disease 2.  Sigmoid perforation 3.  Rectal ulcers:  Newly diagnosed severe, penetrating, advanced phenotypic colonic Crohn's disease diagnosed 01/2022 - Admitted with sigmoid perforation, requiring emergent surgery on 02/07/2022 with descending/sigmoid colectomy, transverse colostomy. -12/11 sigmoidoscopy performed emergently and notable for deep cratered rectal ulcers with bleeding stigmata, treated with PuraSTAT gel -02/26/22 IR drainage large LLQ abscess, grew E coli, bacteroides. Flagyl since 12/13, Rocephin since 12/15.  Drain  interrogated (no fistula, collapsed/resolved abscess) and removed on 12/20.   -12/18 repeat flexible sigmoidoscopy for recurrent bleed.  Cratered ulcers, but overall improved from the previous sigmoidoscopy.  Again treated any stigmata with PuraSTAT gel with resolution -12/20 started Remicade 12/20 along with scfa enemas for possible overlapping diversion colitis. -12/21 changed IV Solu-Medrol to prednisone 40 mg/day  Doing well on current Crohn's therapy. - Continue prednisone 40 mg/day x 2 weeks (03/21/2022), then taper - Remicade given on 12/20, dose #2 will be due 2 weeks from then (about 03/20/2022) - Continue short-chain fatty acid enemas daily for now.  Tentative plan for 2 weeks of therapy then reevaluate need for ongoing scfa - Continue Flagyl with tentative plan for 4-week course pending response to Remicade - Hepatitis B vaccine series as outpatient   4.  Diabetes: BG has been difficult to control due to steroids.  Can hopefully see improvement when starting steroid wean - Management per primary service    - Dr. Lorenso Courier will assume his inpatient GI care on 03/12/2022.  Inpatient GI service will continue to follow periodically   Lavena Bullion, DO  03/10/2022, 4:49 PM [facility] Pager [phone]

## 2022-03-11 DIAGNOSIS — K50118 Crohn's disease of large intestine with other complication: Secondary | ICD-10-CM | POA: Diagnosis not present

## 2022-03-11 LAB — GLUCOSE, CAPILLARY
Glucose-Capillary: 156 mg/dL — ABNORMAL HIGH (ref 70–99)
Glucose-Capillary: 183 mg/dL — ABNORMAL HIGH (ref 70–99)
Glucose-Capillary: 237 mg/dL — ABNORMAL HIGH (ref 70–99)
Glucose-Capillary: 277 mg/dL — ABNORMAL HIGH (ref 70–99)

## 2022-03-11 LAB — MAGNESIUM: Magnesium: 1.5 mg/dL — ABNORMAL LOW (ref 1.7–2.4)

## 2022-03-11 MED ORDER — CHLORHEXIDINE GLUCONATE CLOTH 2 % EX PADS
6.0000 | MEDICATED_PAD | Freq: Two times a day (BID) | CUTANEOUS | Status: DC
  Administered 2022-03-11 – 2022-03-25 (×26): 6 via TOPICAL

## 2022-03-11 MED ORDER — INSULIN ASPART 100 UNIT/ML IJ SOLN
5.0000 [IU] | Freq: Three times a day (TID) | INTRAMUSCULAR | Status: DC
  Administered 2022-03-11 – 2022-03-16 (×15): 5 [IU] via SUBCUTANEOUS

## 2022-03-11 MED ORDER — METFORMIN HCL 500 MG PO TABS
500.0000 mg | ORAL_TABLET | Freq: Every day | ORAL | Status: DC
  Administered 2022-03-12 – 2022-03-23 (×12): 500 mg via ORAL
  Filled 2022-03-11 (×12): qty 1

## 2022-03-11 NOTE — Progress Notes (Signed)
Routine line care: RUE PICC in place. No current IV medications. Please consider PICC removal if pt is otherwise stable.

## 2022-03-11 NOTE — IPOC Note (Signed)
Overall Plan of Care [facility]) Patient Details Name: Richard [name] MRN: [id number] DOB: [birth year]  Admitting Diagnosis: Debility  Hospital Problems: Principal Problem:   Debility Active Problems:   Crohn's disease of large intestine with other complication ([place])     Functional Problem List: Nursing Bladder, Bowel, Sensory, Edema, Skin Integrity, Endurance, Medication Management, Motor, Pain, Safety  PT Balance, Behavior, Skin Integrity, Edema, Endurance, Sensory  OT Balance, Edema, Endurance, Motor, Safety  SLP    TR         Basic ADL's: OT Bathing, Dressing, Toileting     Advanced  ADL's: OT Simple Meal Preparation     Transfers: PT Bed Mobility, Bed to Chair, Car, Manufacturing systems engineer, Metallurgist: PT Ambulation, Emergency planning/management officer, Stairs     Additional Impairments: OT    SLP        TR      Anticipated Outcomes Item Anticipated Outcome  Self Feeding Independent  Swallowing      Basic self-care  Mod I  Toileting  Min Assist with ostomy,   Bathroom Transfers Mod I  Bowel/Bladder  continent with bladder, independent with ostomy  Transfers  CGA  Locomotion  CGA  Communication     Cognition     Pain  less than 3  Safety/Judgment  remain fall free while in rehab, remain infection free while in rehab   Therapy Plan: PT Intensity: Minimum of 1-2 x/day ,45 to 90 minutes PT Frequency: 5 out of 7 days PT Duration Estimated Length of Stay: 3 weeks OT Intensity: Minimum of 1-2 x/day, 45 to 90 minutes OT Frequency: 5 out of 7 days OT Duration/Estimated Length of Stay: 3 weeks     Team Interventions: Nursing Interventions Patient/Family Education, Pain Management, Bladder Management, Medication Management, Discharge Planning, Bowel Management, Skin Care/Wound Management, Psychosocial Support, Disease Management/Prevention  PT interventions Ambulation/gait training, Discharge planning, Functional mobility training, Therapeutic  Activities, Psychosocial support, Balance/vestibular training, Neuromuscular re-education, Skin care/wound management, Therapeutic Exercise, Wheelchair propulsion/positioning, Pain management, UE/LE Strength taining/ROM, Community reintegration, Barrister's clerk education, IT trainer, UE/LE Coordination activities  OT Interventions Training and development officer, Discharge planning, Self Care/advanced ADL retraining, Therapeutic Activities, Therapeutic Exercise, Patient/family education, Functional mobility training, Community reintegration, Engineer, drilling, UE/LE Strength taining/ROM, Wheelchair propulsion/positioning  SLP Interventions    TR Interventions    SW/CM Interventions Discharge Planning, Psychosocial Support, Patient/Family Education   Barriers to Discharge MD  Medical stability  Nursing Decreased caregiver support, Home environment access/layout, Incontinence, Wound Care, Weight 2 level home with B/B main level. 5 ste entry with rails on right/left.  PT Inaccessible home environment, Home environment access/layout STE, frustration w/ situation.  OT      SLP      SW       Team Discharge Planning: Destination: PT-Home ,OT- Home , SLP-  Projected Follow-up: PT-Home health PT, OT-  Home health OT, SLP-  Projected Equipment Needs: PT-To be determined, OT- 3 in 1 bedside comode, SLP-  Equipment Details: PT- , OT-TBD Patient/family involved in discharge planning: PT- Patient, Family member/caregiver,  OT-Patient, Family member/caregiver, SLP-   MD ELOS: 10-14 days Medical Rehab Prognosis:  Excellent Assessment: The patient has been admitted for CIR therapies with the diagnosis of debility. The team will be addressing functional mobility, strength, stamina, balance, safety, adaptive techniques and equipment, self-care, bowel and bladder mgt, patient and caregiver education. Goals have been set at supervision. Anticipated discharge destination is home.         See  Team Conference Notes for weekly updates to the plan of care

## 2022-03-11 NOTE — Progress Notes (Signed)
PROGRESS NOTE   Subjective/Complaints: Patient complains about the number of pills he is receiving. Reviewed the indications of each medication with him and discontinued magnesium.  ROS: +pressure injury, +overflowing ostomy bag, no bleeding  Objective:   No results found. Recent Labs    03/09/22 0450  WBC 10.2  HGB 9.1*  HCT 27.9*  PLT 326   Recent Labs    03/09/22 0450  NA 138  K 3.9  CL 103  CO2 30  GLUCOSE 168*  BUN 10  CREATININE 0.30*  CALCIUM 8.9    Intake/Output Summary (Last 24 hours) at 03/11/2022 1127 Last data filed at 03/11/2022 0930 Gross per 24 hour  Intake 720 ml  Output 3800 ml  Net -3080 ml     Pressure Injury 03/08/22 Buttocks Left Deep Tissue Pressure Injury - Purple or maroon localized area of discolored intact skin or blood-filled blister due to damage of underlying soft tissue from pressure and/or shear. Present on admission to rehab (Active)  03/08/22   Location: Buttocks  Location Orientation: Left  Staging: Deep Tissue Pressure Injury - Purple or maroon localized area of discolored intact skin or blood-filled blister due to damage of underlying soft tissue from pressure and/or shear.  Wound Description (Comments): Present on admission to rehab  Present on Admission: Yes    Physical Exam: Vital Signs Blood pressure 123/86, pulse (!) 104, temperature 98.1 F (36.7 C), temperature source Oral, resp. rate 18, weight 117.3 kg, SpO2 97 %. Gen: no distress, normal appearing HEENT: oral mucosa pink and moist, NCAT Cardio: Tachycardic Chest: normal effort, normal rate of breathing Abd: soft, non-distended Ext: no edema Psych: pleasant, normal affect Skin: left buttocks deep tissue injury, abdominal wound dressing in place Neuro: 5/5 strength BUE, 4/5 BLE   Assessment/Plan: 1. Functional deficits which require 3+ hours per day of interdisciplinary therapy in a comprehensive  inpatient rehab setting. Physiatrist is providing close team supervision and 24 hour management of active medical problems listed below. Physiatrist and rehab team continue to assess barriers to discharge/monitor patient progress toward functional and medical goals  Care Tool:  Bathing    Body parts bathed by patient: Right arm, Left arm, Chest, Abdomen, Right upper leg, Left upper leg, Face         Bathing assist       Upper Body Dressing/Undressing Upper body dressing   What is the patient wearing?: Hospital gown only    Upper body assist      Lower Body Dressing/Undressing Lower body dressing            Lower body assist Assist for lower body dressing: Total Assistance - Patient < 25%     Toileting Toileting    Toileting assist Assist for toileting: Total Assistance - Patient < 25%     Transfers Chair/bed transfer  Transfers assist     Chair/bed transfer assist level: Moderate Assistance - Patient 50 - 74%     Locomotion Ambulation   Ambulation assist   Ambulation activity did not occur: Safety/medical concerns          Walk 10 feet activity   Assist  Walk 10 feet activity did not occur:  Safety/medical concerns        Walk 50 feet activity   Assist Walk 50 feet with 2 turns activity did not occur: Safety/medical concerns         Walk 150 feet activity   Assist Walk 150 feet activity did not occur: Safety/medical concerns         Walk 10 feet on uneven surface  activity   Assist Walk 10 feet on uneven surfaces activity did not occur: Safety/medical concerns         Wheelchair     Assist Is the patient using a wheelchair?: Yes Type of Wheelchair: Manual    Wheelchair assist level: Supervision/Verbal cueing Max wheelchair distance: 75 ft    Wheelchair 50 feet with 2 turns activity    Assist        Assist Level: Supervision/Verbal cueing   Wheelchair 150 feet activity     Assist           Blood pressure 123/86, pulse (!) 104, temperature 98.1 F (36.7 C), temperature source Oral, resp. rate 18, weight 117.3 kg, SpO2 97 %.  Medical Problem List and Plan: 1. Functional deficits secondary to debility             -patient may shower             -ELOS/Goals: 10-14 days S            Continue CIR 2.  Antithrombotics: -DVT/anticoagulation:  Mechanical: Sequential compression devices, below knee Bilateral lower extremities             -antiplatelet therapy: N/A 3. Pain Management: tylenol prn 4. Mood/Behavior/Sleep: LCSW to follow for evaluation and support.              -antipsychotic agents: N/A 5. Neuropsych/cognition: This patient is capable of making decisions on his own behalf. 6. Skin/Wound Care: Wet to dry dressing changes daily to midline.              --continue Zinc. Add vitamin C  7. Fluids/Electrolytes/Nutrition: Monitor I/O. Check CMET in am             -- 8. Sigmoid perforation s/p resection w/abscess: Continue Duricef and Flagyl thru 12/26 9. Rectal ulcers:  Continue short chain fatty acid edemas daily, changed timing to 4pm as per patient's preferences. Please find out from GI how long these should continue 10. Crohn's disease: Remicade given 12/20 and repeat dose in 2 weeks per GI             --continue prednisone 40 mg X 2 weeks followed by 5 mg/week taper.   11. T2DM: Hgb A1C-6.1 and BS poorly controlled due to steroids/immobility.             --will monitor BS ac/hs and titrate insulin as indicated.             --continue Levemir 14 units with  6 units novolog for meal coverage.  12. Fluid overload/Pleural effusions: Daily wts with monitoring for overload             --intake improved and may need to add Low salt restrictions. Messaged nursing to weigh patient today 13. Hypomagnesemia: repeat magnesium today 14. Severe OSA: Discussed need for CPAP/oxygen does not treat underling issues             --declines CPAP "too much at this time/can't lie on the  left" 15. Tachycardic: EKG ordered and shows sinus tachycardia. May be 2/2 steroids, check magnesium  level today.       LOS: 3 days A FACE TO FACE EVALUATION WAS PERFORMED  Richard [name] 03/11/2022, 11:27 AM

## 2022-03-11 NOTE — Progress Notes (Signed)
Pt refused to wear CPAP tonight. ?

## 2022-03-12 DIAGNOSIS — E1165 Type 2 diabetes mellitus with hyperglycemia: Secondary | ICD-10-CM

## 2022-03-12 DIAGNOSIS — K50118 Crohn's disease of large intestine with other complication: Secondary | ICD-10-CM | POA: Diagnosis not present

## 2022-03-12 DIAGNOSIS — R000 Tachycardia, unspecified: Secondary | ICD-10-CM | POA: Diagnosis not present

## 2022-03-12 DIAGNOSIS — Z794 Long term (current) use of insulin: Secondary | ICD-10-CM

## 2022-03-12 DIAGNOSIS — R5381 Other malaise: Secondary | ICD-10-CM | POA: Diagnosis not present

## 2022-03-12 LAB — BASIC METABOLIC PANEL
Anion gap: 6 (ref 5–15)
BUN: 14 mg/dL (ref 8–23)
CO2: 29 mmol/L (ref 22–32)
Calcium: 8.8 mg/dL — ABNORMAL LOW (ref 8.9–10.3)
Chloride: 101 mmol/L (ref 98–111)
Creatinine, Ser: 0.43 mg/dL — ABNORMAL LOW (ref 0.61–1.24)
GFR, Estimated: >60 mL/min (ref >60)
Glucose, Bld: 290 mg/dL — ABNORMAL HIGH (ref 70–99)
Potassium: 3.8 mmol/L (ref 3.5–5.1)
Sodium: 136 mmol/L (ref 135–145)

## 2022-03-12 LAB — GLUCOSE, CAPILLARY
Glucose-Capillary: 242 mg/dL — ABNORMAL HIGH (ref 70–99)
Glucose-Capillary: 205 mg/dL — ABNORMAL HIGH (ref 70–99)
Glucose-Capillary: 304 mg/dL — ABNORMAL HIGH (ref 70–99)
Glucose-Capillary: 308 mg/dL — ABNORMAL HIGH (ref 70–99)

## 2022-03-12 LAB — CBC
HCT: 30.9 % — ABNORMAL LOW (ref 39.0–52.0)
Hemoglobin: 9.6 g/dL — ABNORMAL LOW (ref 13.0–17.0)
MCH: 32 pg (ref 26.0–34.0)
MCHC: 31.1 g/dL (ref 30.0–36.0)
MCV: 103 fL — ABNORMAL HIGH (ref 80.0–100.0)
Platelets: 308 10*3/uL (ref 150–400)
RBC: 3 MIL/uL — ABNORMAL LOW (ref 4.22–5.81)
RDW: 18.3 % — ABNORMAL HIGH (ref 11.5–15.5)
WBC: 8.7 10*3/uL (ref 4.0–10.5)
nRBC: 0 % (ref 0.0–0.2)

## 2022-03-12 MED ORDER — INSULIN DETEMIR 100 UNIT/ML ~~LOC~~ SOLN
16.0000 [IU] | Freq: Every day | SUBCUTANEOUS | Status: DC
  Administered 2022-03-12 – 2022-03-13 (×2): 16 [IU] via SUBCUTANEOUS
  Filled 2022-03-12 (×3): qty 0.16

## 2022-03-12 MED ORDER — NAPHAZOLINE-GLYCERIN 0.012-0.25 % OP SOLN: 1.0000 [drp] | Freq: Four times a day (QID) | OPHTHALMIC | Status: DC | PRN

## 2022-03-12 MED ORDER — SIMETHICONE 40 MG/0.6ML PO SUSP: 80.0000 mg | Freq: Four times a day (QID) | ORAL | 0 refills | Status: DC | PRN

## 2022-03-12 MED ORDER — MAGNESIUM SULFATE 4 GM/100ML IV SOLN
4.0000 g | Freq: Once | INTRAVENOUS | Status: AC
  Administered 2022-03-12: 4 g via INTRAVENOUS
  Filled 2022-03-12: qty 100

## 2022-03-12 NOTE — Progress Notes (Addendum)
PROGRESS NOTE   Subjective/Complaints: He is happy with this progress with therapy.   ROS: Denies CP, SOB+pressure injury, +ostomy bag, no bleeding  Objective:   No results found. Recent Labs    03/12/22 0456  WBC 8.7  HGB 9.6*  HCT 30.9*  PLT 308    Recent Labs    03/12/22 0456  NA 136  K 3.8  CL 101  CO2 29  GLUCOSE 290*  BUN 14  CREATININE 0.43*  CALCIUM 8.8*     Intake/Output Summary (Last 24 hours) at 03/12/2022 1526 Last data filed at 03/12/2022 0743 Gross per 24 hour  Intake 480 ml  Output 2275 ml  Net -1795 ml      Pressure Injury 03/08/22 Buttocks Left Deep Tissue Pressure Injury - Purple or maroon localized area of discolored intact skin or blood-filled blister due to damage of underlying soft tissue from pressure and/or shear. Present on admission to rehab (Active)  03/08/22   Location: Buttocks  Location Orientation: Left  Staging: Deep Tissue Pressure Injury - Purple or maroon localized area of discolored intact skin or blood-filled blister due to damage of underlying soft tissue from pressure and/or shear.  Wound Description (Comments): Present on admission to rehab  Present on Admission: Yes    Physical Exam: Vital Signs Blood pressure 129/78, pulse (!) 101, temperature 98.8 F (37.1 C), resp. rate 20, weight 98.4 kg, SpO2 96 %. Gen: no distress, normal appearing HEENT: oral mucosa pink and moist, NCAT Cardio: Tachycardic Chest: CTAB, normal effort, normal rate of breathing Abd: soft, non-distended, +BS Ext: no edema Psych: pleasant, normal affect Skin: left buttocks deep tissue injury, abdominal wound dressing in place Neuro: 5/5 strength BUE, 4/5 BLE   Assessment/Plan: 1. Functional deficits which require 3+ hours per day of interdisciplinary therapy in a comprehensive inpatient rehab setting. Physiatrist is providing close team supervision and 24 hour management of active  medical problems listed below. Physiatrist and rehab team continue to assess barriers to discharge/monitor patient progress toward functional and medical goals  Care Tool:  Bathing    Body parts bathed by patient: Right arm, Left arm, Chest, Abdomen, Right upper leg, Left upper leg, Face         Bathing assist Assist Level: Maximal Assistance - Patient 24 - 49% (Per OT Eval)     Upper Body Dressing/Undressing Upper body dressing   What is the patient wearing?: Pull over shirt    Upper body assist Assist Level: Minimal Assistance - Patient > 75%    Lower Body Dressing/Undressing Lower body dressing      What is the patient wearing?: Pants     Lower body assist Assist for lower body dressing: Maximal Assistance - Patient 25 - 49%     Toileting Toileting    Toileting assist Assist for toileting: Total Assistance - Patient < 25%     Transfers Chair/bed transfer  Transfers assist     Chair/bed transfer assist level: Moderate Assistance - Patient 50 - 74%     Locomotion Ambulation   Ambulation assist   Ambulation activity did not occur: Safety/medical concerns          Walk 10 feet  activity   Assist  Walk 10 feet activity did not occur: Safety/medical concerns        Walk 50 feet activity   Assist Walk 50 feet with 2 turns activity did not occur: Safety/medical concerns         Walk 150 feet activity   Assist Walk 150 feet activity did not occur: Safety/medical concerns         Walk 10 feet on uneven surface  activity   Assist Walk 10 feet on uneven surfaces activity did not occur: Safety/medical concerns         Wheelchair     Assist Is the patient using a wheelchair?: Yes Type of Wheelchair: Manual    Wheelchair assist level: Supervision/Verbal cueing Max wheelchair distance: 75 ft    Wheelchair 50 feet with 2 turns activity    Assist        Assist Level: Supervision/Verbal cueing   Wheelchair 150 feet  activity     Assist      Assist Level: Total Assistance - Patient < 25%   Blood pressure 129/78, pulse (!) 101, temperature 98.8 F (37.1 C), resp. rate 20, weight 98.4 kg, SpO2 96 %.  Medical Problem List and Plan: 1. Functional deficits secondary to debility             -patient may shower             -ELOS/Goals: 10-14 days S            Continue CIR  Team conference today please see physician documentation under team conference tab, met with team  to discuss problems,progress, and goals. Formulized individual treatment plan based on medical history, underlying problem and comorbidities.   2.  Antithrombotics: -DVT/anticoagulation:  Mechanical: Sequential compression devices, below knee Bilateral lower extremities             -antiplatelet therapy: N/A 3. Pain Management: tylenol prn 4. Mood/Behavior/Sleep: LCSW to follow for evaluation and support.              -antipsychotic agents: N/A 5. Neuropsych/cognition: This patient is capable of making decisions on his own behalf. 6. Skin/Wound Care: Wet to dry dressing changes daily to midline.              --continue Zinc. Add vitamin C  7. Fluids/Electrolytes/Nutrition: Monitor I/O. Check CMET in am             -- 8. Sigmoid perforation s/p resection w/abscess: Continue Duricef and Flagyl thru 12/26 9. Rectal ulcers:  Continue short chain fatty acid edemas daily, changed timing to 4pm as per patient's preferences. Please find out from GI how long these should continue 10. Crohn's disease: Remicade given 12/20 and repeat dose in 2 weeks per GI             --continue prednisone 40 mg X 2 weeks followed by 5 mg/week taper.   11. T2DM: Hgb A1C-6.1 and BS poorly controlled due to steroids/immobility.             --will monitor BS ac/hs and titrate insulin as indicated.             --continue Levemir 14 units with  6 units novolog for meal coverage.   -12/26 Increase Levemir to 16u  CBG (last 3)  Recent Labs    03/11/22 2112  03/12/22 0636 03/12/22 1141  GLUCAP 277* 242* 205*    12. Fluid overload/Pleural effusions: Daily wts with monitoring for overload             --  intake improved and may need to add Low salt restrictions. Monitor daily weights   Filed Weights   03/09/22 1700 03/12/22 0948  Weight: 117.3 kg 98.4 kg     13. Hypomagnesemia:  -Mg 1.3-->1.5 and till low. Will add oral supplement.             12/23: Mag is 1.4, ordered 2grams IV supplement             12/24: repeat magnesium ordered today  12/26 MG is 1.5, called pharmacy-recommended 4g x1 and recheck in AM, appreciate assistance 14. Severe OSA: Discussed need for CPAP/oxygen does not treat underling issues             --declines CPAP "too much at this time/can't lie on the left" 15. Tachycardic: EKG ordered and shows sinus tachycardia. May be 2/2 steroids, check magnesium level today.   -HR around 100, improved      LOS: 4 days A FACE TO FACE EVALUATION WAS PERFORMED  Jennye Boroughs 03/12/2022, 3:26 PM

## 2022-03-12 NOTE — Progress Notes (Addendum)
[facility] ROUNDING NOTE   Subjective: Has been making great progress with OT and PT. He is still using his short chain fatty enemas once daily. Denies any signs of bleeding. Blood counts continue to rise.  Objective: Vital signs in last 24 hours: Temp:  [98.2 F (36.8 C)-98.8 F (37.1 C)] 98.8 F (37.1 C) (12/26 1403) Pulse Rate:  [100-101] 101 (12/26 1403) Resp:  [18-20] 20 (12/26 1403) BP: (129-143)/(77-85) 129/78 (12/26 1403) SpO2:  [96 %-97 %] 96 % (12/26 1403) Weight:  [98.4 kg] 98.4 kg (12/26 0948) Last BM Date : 03/11/22 General: NAD Abdomen: Mildly distended   Intake/Output from previous day: 12/25 0701 - 12/26 0700 In: 480 [P.O.:480] Out: 3125 [Urine:2750; Stool:375] Intake/Output this shift: Total I/O In: 240 [P.O.:240] Out: -    Lab Results: Recent Labs    03/12/22 0456  WBC 8.7  HGB 9.6*  PLT 308  MCV 103.0*   BMET Recent Labs    03/12/22 0456  NA 136  K 3.8  CL 101  CO2 29  GLUCOSE 290*  BUN 14  CREATININE 0.43*  CALCIUM 8.8*   LFT No results for input(s): "PROT", "ALBUMIN", "AST", "ALT", "ALKPHOS", "BILITOT", "BILIDIR", "IBILI" in the last 72 hours.  PT/INR No results for input(s): "INR" in the last 72 hours.    Imaging/Other results: No results found.    Assessment and Plan:  1.  Crohn's disease 2.  Sigmoid perforation 3.  Rectal ulcers:  Newly diagnosed severe, penetrating, advanced phenotypic colonic Crohn's disease diagnosed 01/2022 - Admitted with sigmoid perforation, requiring emergent surgery on 02/07/2022 with descending/sigmoid colectomy, transverse colostomy. -12/11 sigmoidoscopy performed emergently and notable for deep cratered rectal ulcers with bleeding stigmata, treated with PuraSTAT gel -02/26/22 IR drainage large LLQ abscess, grew E coli, bacteroides. Flagyl since 12/13, Rocephin since 12/15.  Drain interrogated (no fistula, collapsed/resolved abscess) and removed on 12/20.   -12/18 repeat  flexible sigmoidoscopy for recurrent bleed.  Cratered ulcers, but overall improved from the previous sigmoidoscopy.  Again treated any stigmata with PuraSTAT gel with resolution -12/20 started Remicade 12/20 along with scfa enemas for possible overlapping diversion colitis. -12/21 changed IV Solu-Medrol to prednisone 40 mg/day  Doing well on current Crohn's therapy. - Continue prednisone 40 mg/day x 2 weeks (03/21/2022), then taper - Remicade given on 12/20, dose #2 will be due 2 weeks from then (about 03/20/2022) - Continue short-chain fatty acid enemas daily for now.  Tentative plan for 2 weeks of therapy then reevaluate need for ongoing scfa - Continue Flagyl with tentative plan for 4-week course pending response to Remicade - Hepatitis B vaccine series as outpatient  4.  Diabetes: BG has been difficult to control due to steroids.  Can hopefully see improvement when starting steroid wean - Management per primary service   Sharyn Creamer, MD  03/12/2022, 4:19 PM [facility]

## 2022-03-12 NOTE — Progress Notes (Signed)
Inpatient Rehabilitation  Patient information reviewed and entered into eRehab system by Delaney Perona M. Miguel Christiana, M.A., CCC/SLP, PPS Coordinator.  Information including medical coding, functional ability and quality indicators will be reviewed and updated through discharge.    

## 2022-03-12 NOTE — Progress Notes (Signed)
Patient ID: Richard [name], male   DOB: 11-Oct-1955, 66 y.o.   MRN: [id number]  Inpatient Rehabilitation Care Coordinator Assessment and Plan Patient Details  Name: Richard [name] MRN: [id number] Date of Birth: 10-08-1955   Today's Date: 03/04/2022   Hospital Problems: Principal Problem:   Debility Active Problems:   ABLA (acute blood loss anemia)   Crohn's disease of large intestine with other complication [facility])   Past Medical History:      Past Medical History:  Diagnosis Date   Adult ADHD     Allergic rhinitis     Anxiety      claustrophobic   Bronchopneumonia 11/03/2013   Colon polyps 2012 and 2013    All hyperplastic except one adenomatous w/out high grade dysplasia (recall 2023 per Dr. Deatra Ina)   COVID-19 virus infection 09/29/2020   Elevated transaminase level 2017    Viral hep screening neg.  Suspect fatty liver.   Hyperlipidemia     Hypertension     Obesity     OSA on CPAP      Severe.  CPAP 10 cm H2O.  Follows Dr. Ander Slade.   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 [name] to do surg   Pneumonia     Seborrheic dermatitis of scalp      and face: hydrocort 2% cream bid prn to face, and fluocinonide 0.5% sol'n to scalp bid prn per Dr. Allyson Sabal (04/2014)   Secondary male hypogonadism 04/2021   Type 2 diabetes mellitus with microalbuminuria ([place]) DM dx-09/27/2015. Microalb 2020    Past Surgical History:       Past Surgical History:  Procedure Laterality Date   COLONOSCOPY W/ POLYPECTOMY   04/2010; 10/2011    Initial screening TCS showed 6 polyps (one adenomatous).  Pt says he returned for repeat TCS 10/2011 showed one hyperplastic polyp: Recall 10 yrs per Dr. Samuella Cota SIGMOIDOSCOPY N/A 02/25/2022    Procedure: FLEXIBLE SIGMOIDOSCOPY;  Surgeon: Irving Copas., MD;  Location: [facility];  Service: Gastroenterology;  Laterality: N/A;   HEMOSTASIS CONTROL   02/25/2022    Procedure: HEMOSTASIS CONTROL;  Surgeon: Rush Landmark Telford Nab., MD;   Location: [facility];  Service: Gastroenterology;;   INCISION AND DRAINAGE ABSCESS N/A 11/07/2021    Procedure: Incision and drainage of scrotal abscess;  Surgeon: Leighton Ruff, MD;  Location: [facility];  Service: General;  Laterality: N/A;   LAPAROTOMY N/A 02/07/2022    Procedure: EXPLORATORY LAPAROTOMY, COLON RESECTION, OSTOMY;  Surgeon: Leighton Ruff, MD;  Location: [facility];  Service: General;  Laterality: N/A;   MOUTH SURGERY        Social History:  reports that he quit smoking about 19 years ago. His smoking use included cigarettes. He smoked an average of 1 pack per day. He has never used smokeless tobacco. He reports current alcohol use of about 7.0 standard drinks of alcohol per week. He reports that he does not use drugs.   Family / Support Systems Marital Status: Married Patient Roles: Spouse, Other (Comment) (Business Owner) Spouse/Significant Other: [name] [phone] Other Supports: Friends and church members Anticipated Caregiver: wife Ability/Limitations of Caregiver: Wife works from home can assist Caregiver Availability: 24/7 Family Dynamics: Close with family and freinds he is rally wanting to get out of here and get back to work. Needs to be mobile and doing better than he is now   Social History Preferred language: English Religion:  Cultural Background: No issues Education: Secretary/administrator educated Health Literacy - How often do  you need to have someone help you when you read instructions, pamphlets, or other written material from your doctor or pharmacy?: Never Writes: Yes Employment Status: Employed Name of Employer: Patent attorney Return to Work Plans: Plans to return and feels needs to be there now since has missed 5 weeks now since being in the hospital Legal History/Current Legal Issues: No issues Guardian/Conservator: None-according to MD pt is capable of making his own decisions while here. Wife plans to be here daily and is involved     Abuse/Neglect Abuse/Neglect Assessment Can Be Completed: Yes Physical Abuse: Denies Verbal Abuse: Denies Sexual Abuse: Denies Exploitation of patient/patient's resources: Denies Self-Neglect: Denies   Patient response to: Social Isolation - How often do you feel lonely or isolated from those around you?: Never   Emotional Status Pt's affect, behavior and adjustment status: Pt is motivated and wanting to get home and back to work. He has been independent in the past and even with health issues has done for himself. His wife is a string support and will assist. Pt does not want her to have too Recent Psychosocial Issues: other health issues was managing until this event Psychiatric History: History of ADHD/anxiety takes medications for this but would benefit from seeing neuro-psych while here. Will place him on the list to be seen Substance Abuse History: No issues   Patient / Family Perceptions, Expectations & Goals Pt/Family understanding of illness & functional limitations: Pt and wife can explain his health issues and procedures he has had while in the hospital. Pt is wanting to get out of the hospital. His wife is willing to learn what she needs to to assist him. Premorbid pt/family roles/activities: husband, owner, friend, church member, etc Anticipated changes in roles/activities/participation: resume Pt/family expectations/goals: Pt states: " I have been in a hospital for five weeks and need to get home and back to my business."  Wife states: " He needs to be ready and will do what she can to help him."   [employer]: None Premorbid Home Care/DME Agencies: None Transportation available at discharge: pt and wife Is the patient able to respond to transportation needs?: Yes In the past 12 months, has lack of transportation kept you from medical appointments or from getting medications?: No In the past 12 months, has lack of transportation kept you from  meetings, work, or from getting things needed for daily living?: No Resource referrals recommended: Neuropsychology   Discharge Planning Living Arrangements: Spouse/significant other Support Systems: Spouse/significant other, Friends/neighbors, Social worker community Type of Residence: Private residence Insurance underwriter Resources: Multimedia programmer (specify) Psychologist, counselling) Financial Resources: Employment, Secondary school teacher Screen Referred: No Living Expenses: Own Money Management: Patient, Spouse Does the patient have any problems obtaining your medications?: No Home Management: Wife Patient/Family Preliminary Plans: Return homre with wife who does work from home and can assist him if needed. Pt is aware of team conference tomorrow and will set target discharge date for home. Care Coordinator Barriers to Discharge: Insurance for SNF coverage Care Coordinator Anticipated Follow Up Needs: HH/OP   Clinical Impression Pleasant but wanting to go home and get back to his business. His wife is supportive and present in his room and plans to be here daily. Have placed on neuro-psych list and will assist with discharge needs.  Addendhum: 03/12/22 this worker had pt prior to his transfer back to acute will continue to follow and assist with discharge needs. Elease Hashimoto 03/04/2022, 10:03 AM

## 2022-03-12 NOTE — Patient Care Conference (Signed)
Inpatient RehabilitationTeam Conference and Plan of Care Update Date: 03/12/2022   Time: 11:45 AM    Patient Name: Richard [name]      Medical Record Number: [id number]  Date of Birth: Mar 30, 1955 Sex: Male         Room/Bed: 4M07C/4M07C-01 Payor Info: Payor: [employer] / Plan: Electrical engineer / Product Type: *No Product type* /    Admit Date/Time:  03/08/2022  4:02 PM  Primary Diagnosis:  [place] Hospital Problems: Principal Problem:   Debility Active Problems:   Crohn's disease of large intestine with other complication [facility])    Expected Discharge Date: Expected Discharge Date: 03/27/22  Team Members Present: Physician leading conference: Dr. Jennye Boroughs Social Worker Present: Ovidio Kin, LCSW Nurse Present: Tacy Learn, RN PT Present: Verl Dicker, PT OT Present: Jamey Ripa, OT PPS Coordinator present : Gunnar Fusi, SLP     Current Status/Progress Goal Weekly Team Focus  Bowel/Bladder   Colostomy bag for bowel. Incontinent of urine, condom cath in place   Regain continence and bowel independence   Assist with tolieting qshift and prn    Swallow/Nutrition/ Hydration               ADL's   fluctuates from am to p.  mod/min (increased time) with squat pivot/slide board level surface . Total assist/ Assist of 2 with sit to stand. Mod A with Stedy  Needs AE for LE dressing   min A transfers and LB self care   progress activity tolerance, Standing progression    Mobility   CGA/min bed mobility, max A STS, mod A squat pivot transfer, (S) w/c propulsion x 75 ft   CGA transfers and gait  pt/fam education, transfers, gait, activity tolerance, strengthening    Communication                Safety/Cognition/ Behavioral Observations               Pain   No c/o pain   Remain pain free   Assess qshift and prn    Skin   Incision mid abdomen. Deep tissue pressure injury on left buttocks.   Maintain skin integrity  Assess qshift and prn       Discharge Planning:  HOme with wife who is able to provide limited assist-does work from home.   Team Discussion: Debility. Ostomy that is patent. Incontinent of urine. Denies pain. Left buttocks DTI. Tachycardia. EKG ordered. Refuses CPAP. Monitor CBGs. Mod/Max A SP. Mod/Max A SB. Was able to tolerate standing for 2 min today for self care.  Patient on target to meet rehab goals: Propelled wheelchair 75 ft  *See Care Plan and progress notes for long and short-term goals.   Revisions to Treatment Plan:  Monitor labs, monitor CBGs  Teaching Needs: Medications, safety, skin/wound care, gait/transfer training, etc.   Current Barriers to Discharge: Decreased caregiver support, Home enviroment access/layout, Incontinence, Neurogenic bowel and bladder, Wound care, Weight, and Medication compliance  Possible Resolutions to Barriers: Family education, nursing education, order recommended DME     Medical Summary Current Status: Debility, DM2, hypomagnesium, tachycardia, Crohns  Barriers to Discharge: Electrolyte abnormality;Medical stability;Self-care education  Barriers to Discharge Comments: Debility, DM2, hypomagnesium, tachycardia, Crohns Possible Resolutions to Barriers/Weekly Focus: replete MG, increase insulin dose   Continued Need for Acute Rehabilitation Level of Care: The patient requires daily medical management by a physician with specialized training in physical medicine and rehabilitation for the following reasons: Direction of a multidisciplinary physical rehabilitation program  to maximize functional independence : Yes Medical management of patient stability for increased activity during participation in an intensive rehabilitation regime.: Yes Analysis of laboratory values and/or radiology reports with any subsequent need for medication adjustment and/or medical intervention. : Yes   I attest that I was present, lead the team conference, and concur with the assessment  and plan of the team.   Ernest Pine 03/12/2022, 3:50 PM

## 2022-03-12 NOTE — Progress Notes (Signed)
Physical Therapy Session Note  Patient Details  Name: TLALOC TADDEI MRN: [id number] Date of Birth: February 03, 1956  Today's Date: 03/12/2022 PT Individual Time: 0915-0946 PT Individual Time Calculation (min): 31 min   Short Term Goals: Week 1:  PT Short Term Goal 1 (Week 1): pt will perform supine to/from sit w/mod assist of 1 PT Short Term Goal 2 (Week 1): pt will perform sit to stand from elevated surface w/mod assist of 2 PT Short Term Goal 3 (Week 1): Pt will transfer bed <> w/c w/ min A PT Short Term Goal 4 (Week 1): Pt will negotiate w/c 47' w/ supervision PT Short Term Goal 5 (Week 1): PT will assess gait.  Skilled Therapeutic Interventions/Progress Updates:      Therapy Documentation Precautions:  Precautions Precautions: None Precaution Comments: L colostomy, monitor O2/HR Restrictions Weight Bearing Restrictions: No  Pt agreeable to PT session with emphasis on gait training and declines pain in session. Pt transported total A by w/c for time management and energy conservation. Pt requires min/mod A for sit to stand with parallel bars and unable to stand with B UE pushing up from w/c. Pt ambulated CGA 8 feet x 2 in parallel bars. PT educated pt regarding energy conservation and pacing throughout session. Pt propelled w/c ~30 ft with supervision to increase UE activity tolerance and requires mod A for squat pivot and CGA for sit to lying. Pt left semi-reclined in bed with all needs in reach and bed alarm on.    Therapy/Group: Individual Therapy  Verl Dicker Verl Dicker PT, DPT  03/12/2022, 7:44 AM

## 2022-03-12 NOTE — Consult Note (Signed)
[place] Nurse ostomy follow up Stoma type/location:  Stoma is red and viable, above skin level, 2 1/4 inch.  Peristomal assessment: patient has circumferential hypergranulation tissue  Output: 50 cc liquid brown stool Ostomy pouching: 2pc.  Education provided:  Wife performed pouch change with minimal verbal cues.  Pt and wife were able to open and close velcro to empty.  Patient able to burp pouch.  Patient and wife verbalize the importance of emptying pouch when 1/3 to 1/2 full.    Patient previously enrolled in [place].    [place] nurse will follow along for further ostomy teaching and care.     Bradd Merlos, RN-BC, MSN, [employer]

## 2022-03-12 NOTE — Progress Notes (Signed)
Occupational Therapy Session Note  Patient Details  Name: Richard [name] MRN: [id number] Date of Birth: 08-23-1955  Today's Date: 03/12/2022 OT Individual Time: 1st Session [id number], 2nd Session 1415-1530 OT Individual Time Calculation (min): 60 min, 75 min    Short Term Goals: Week 1:  OT Short Term Goal 1 (Week 1): Patient will perform UB dressing with set up assist OT Short Term Goal 2 (Week 1): Patient will stand for clothing management with Mod assist of one with grab bar OT Short Term Goal 3 (Week 1): Patient will perform toilet transfer with Mod assist of one squat pivot OT Short Term Goal 4 (Week 1): Patient will be educated in use of AE for LE dressing and bathing OT Short Term Goal 5 (Week 1): Patient will perform LE bathing with Mod assist  Skilled Therapeutic Interventions/Progress Updates:    1st Session:  Pt awake and alert and eager for am OT session. OT focus on activity tolerance progression, energy conservation and task simplification for AM self care routine. No pain reported. Pt moved from supine to sit with close S and EOB with close S in prep for LB self care. agreeable for LB dressing at EOB with max A to don slipper socks, and LB garments over feet but then mod A for pulling up clothing. Stood with support 2 minutes for LB garment management. W/c sink side grooming with set up. OT training with B UE HEP for foam cube squeezes, breathing integration, yellow tband for scap, biceps and triceps all 10 reps each. Significant weakness B shoulders with inability to perform resistance overhead. Will train in mat level cane ex for prox strengthening. Left pt w/c level with all safety needs and call button and tray table within reach.   2nd Session:   Pt seen for pm session with focus on functional activity tolerance and strength for ADL's and mobility. Pt bed level upon OT arrival. OT disconnected pt from [place] with line safety.  Moved to EOB with CGA. Sat with close S.  Transfer bed to/from w/c with min A/CGA with TB and then to NuStep seat and back to chair. Pt pleased with discharge date established at [facility] and motivated to progress. Pt able to complete 3 sets of 3 min on L1 with HR VSR and 4-5 min rest between- HR 122 HR 120 HR 118. Pt self propelled w/c back to room with 20 ft interval x 2 with rest between. Once back in room, pt transferred back to bed for energy conservation and doffed pullover shirt with S and pull off shorts bed level via bridging with min A. Pt requested purewick replaced with line safety for voiding and reports he would consider purchasing for home use and will discuss with nursing. Bed alarm engaged and all needs and call button within reach.   Therapy Documentation Precautions:  Precautions Precautions: None Precaution Comments: L colostomy, monitor O2/HR Restrictions Weight Bearing Restrictions: No    Therapy/Group: Individual Therapy  Barnabas Lister 03/12/2022, 7:47 AM

## 2022-03-12 NOTE — Discharge Instructions (Addendum)
Inpatient Rehab Discharge Instructions  Richard [name] Discharge date and time:    Activities/Precautions/ Functional Status: Activity: no lifting, driving, or strenuous exercise till cleared by MD.  Diet: cardiac diet NO alcohol.  Wound Care:  Damp to dry dressing changes to abdominal incision. Change daily. Contact Dr.Thomas if you develop any problems with your incision/wound--redness, swelling, increase in pain, drainage or if you develop fever or chills.    Functional status:  ___ No restrictions     ___ Walk up steps independently _X__ 24/7 supervision/assistance   ___ Walk up steps with assistance ___ Intermittent supervision/assistance  ___ Bathe/dress independently ___ Walk with walker     _X__ Bathe/dress with assistance ___ Walk Independently    ___ Shower independently ___ Walk with assistance    ___ Shower with assistance _X__ No alcohol     ___ Return to work/school ________  Special Instructions:     COMMUNITY REFERRALS UPON DISCHARGE:    Home Health:   PT  OT  RN                Agency: [facility]    Phone: [phone]   Medical Equipment/Items Ordered: [facility]                                                 Agency/Supplier: [facility]   [phone]    My questions have been answered and I understand these instructions. I will adhere to these goals and the provided educational materials after my discharge from the hospital.  Patient/Caregiver Signature _______________________________ Date __________  Clinician Signature _______________________________________ Date __________  Please bring this form and your medication list with you to all your follow-up doctor's appointments.

## 2022-03-12 NOTE — Progress Notes (Signed)
[facility] Individual Statement of Services  Patient Name:  Richard [name]  Date:  03/12/2022  Welcome to the [facility].  Our goal is to provide you with an individualized program based on your diagnosis and situation, designed to meet your specific needs.  With this comprehensive rehabilitation program, you will be expected to participate in at least 3 hours of rehabilitation therapies Monday-Friday, with modified therapy programming on the weekends.  Your rehabilitation program will include the following services:  Physical Therapy (PT), Occupational Therapy (OT), Speech Therapy (ST), 24 hour per day rehabilitation nursing, Therapeutic Recreaction (TR), Neuropsychology, Care Coordinator, Rehabilitation Medicine, Nutrition Services, and Pharmacy Services  Weekly team conferences will be held on Tuesday to discuss your progress.  Your Inpatient Rehabilitation Care Coordinator will talk with you frequently to get your input and to update you on team discussions.  Team conferences with you and your family in attendance may also be held.  Expected length of stay: 3 weeks  Overall anticipated outcome: CGA-min-tolieting  Depending on your progress and recovery, your program may change. Your Inpatient Rehabilitation Care Coordinator will coordinate services and will keep you informed of any changes. Your Inpatient Rehabilitation Care Coordinator's name and contact numbers are listed  below.  The following services may also be recommended but are not provided by the [facility] will be made to provide these services after discharge if needed.  Arrangements include referral to agencies that provide these services.  Your insurance has been verified to be:  [place] Your primary doctor is:  Shawnie Dapper  Pertinent information will be shared with your doctor and your insurance company.  Inpatient Rehabilitation Care Coordinator:  Ovidio Kin, [place] or Emilia Beck  Information discussed with and copy given to patient by: Elease Hashimoto, 03/12/2022, 10:07 AM

## 2022-03-12 NOTE — Progress Notes (Signed)
Patient ID: Richard [name], male   DOB: 09-24-1955, 66 y.o.   MRN: [id number]  Met with pt and wife who is present in his room to give them the team conference update regarding goals of CGA-min level and target discharge date of 1/10. He feels he did well today and surprised himself he feels he was not ready when he was here before and now is medially and mentally ready. His wife agrees with this. Both are hopeful he will do better than team thinks and hopefully move up discharge date sooner. Will work on discharge needs and update [facility] since following along for when goes home.

## 2022-03-13 DIAGNOSIS — F329 Major depressive disorder, single episode, unspecified: Secondary | ICD-10-CM | POA: Diagnosis not present

## 2022-03-13 DIAGNOSIS — R5381 Other malaise: Secondary | ICD-10-CM | POA: Diagnosis not present

## 2022-03-13 LAB — GLUCOSE, CAPILLARY
Glucose-Capillary: 155 mg/dL — ABNORMAL HIGH (ref 70–99)
Glucose-Capillary: 152 mg/dL — ABNORMAL HIGH (ref 70–99)
Glucose-Capillary: 304 mg/dL — ABNORMAL HIGH (ref 70–99)
Glucose-Capillary: 288 mg/dL — ABNORMAL HIGH (ref 70–99)

## 2022-03-13 LAB — MAGNESIUM: Magnesium: 1.6 mg/dL — ABNORMAL LOW (ref 1.7–2.4)

## 2022-03-13 MED ORDER — METRONIDAZOLE 500 MG PO TABS
500.0000 mg | ORAL_TABLET | Freq: Two times a day (BID) | ORAL | Status: DC
  Administered 2022-03-13 – 2022-03-25 (×25): 500 mg via ORAL
  Filled 2022-03-13 (×25): qty 1

## 2022-03-13 MED ORDER — MAGNESIUM SULFATE 2 GM/50ML IV SOLN
2.0000 g | Freq: Once | INTRAVENOUS | Status: AC
  Administered 2022-03-13: 2 g via INTRAVENOUS
  Filled 2022-03-13: qty 50

## 2022-03-13 NOTE — Progress Notes (Signed)
[facility] ROUNDING NOTE   Subjective: Still been getting enemas. Had a BM this morning that did not show any signs of bleeding.   Objective: Vital signs in last 24 hours: Temp:  [97.6 F (36.4 C)-98.8 F (37.1 C)] 97.6 F (36.4 C) (12/27 0440) Pulse Rate:  [92-102] 92 (12/27 0440) Resp:  [18-20] 18 (12/27 0440) BP: (104-130)/(78-87) 130/87 (12/27 0440) SpO2:  [96 %-97 %] 97 % (12/27 0440) Weight:  [98.1 kg] 98.1 kg (12/27 0500) Last BM Date : 03/11/22 General: NAD Abdomen: Mildly distended   Intake/Output from previous day: 12/26 0701 - 12/27 0700 In: 480 [P.O.:480] Out: 1500 [Urine:1300; Stool:200] Intake/Output this shift: No intake/output data recorded.   Lab Results: Recent Labs    03/12/22 0456  WBC 8.7  HGB 9.6*  PLT 308  MCV 103.0*   BMET Recent Labs    03/12/22 0456  NA 136  K 3.8  CL 101  CO2 29  GLUCOSE 290*  BUN 14  CREATININE 0.43*  CALCIUM 8.8*   LFT No results for input(s): "PROT", "ALBUMIN", "AST", "ALT", "ALKPHOS", "BILITOT", "BILIDIR", "IBILI" in the last 72 hours.  PT/INR No results for input(s): "INR" in the last 72 hours.    Imaging/Other results: No results found.    Assessment and Plan:  1.  Crohn's disease 2.  Sigmoid perforation 3.  Rectal ulcers:  Newly diagnosed severe, penetrating, advanced phenotypic colonic Crohn's disease diagnosed 01/2022 - Admitted with sigmoid perforation, requiring emergent surgery on 02/07/2022 with descending/sigmoid colectomy, transverse colostomy. -12/11 sigmoidoscopy performed emergently and notable for deep cratered rectal ulcers with bleeding stigmata, treated with PuraSTAT gel -02/26/22 IR drainage large LLQ abscess, grew E coli, bacteroides. Flagyl since 12/13, Rocephin since 12/15.  Drain interrogated (no fistula, collapsed/resolved abscess) and removed on 12/20.   -12/18 repeat flexible sigmoidoscopy for recurrent bleed.  Cratered ulcers, but overall improved from the  previous sigmoidoscopy.  Again treated any stigmata with PuraSTAT gel with resolution -12/20 started Remicade 12/20 along with scfa enemas for possible overlapping diversion colitis. -12/21 changed IV Solu-Medrol to prednisone 40 mg/day  Doing well on current Crohn's therapy. - Continue prednisone 40 mg/day x 2 weeks (03/21/2022), then taper - Remicade given on 12/20, dose #2 will be due 2 weeks from then (about 03/20/2022) - Continue short-chain fatty acid enemas daily for now.  Tentative plan for 2 weeks of therapy then reevaluate need for ongoing scfa - Continue Flagyl with tentative plan for 4-week course pending response to Remicade - Hepatitis B vaccine series as outpatient  4.  Diabetes: BG has been difficult to control due to steroids.  Can hopefully see improvement when starting steroid wean - Management per primary service   Sharyn Creamer, MD  03/13/2022, 11:29 AM [facility]

## 2022-03-13 NOTE — Progress Notes (Signed)
PROGRESS NOTE   Subjective/Complaints: No new complaints this morning Magnesium improved to 1.6, discussed with patient, and will give 2grams IV today  ROS: Denies CP, SOB+pressure injury, +ostomy bag, no bleeding  Objective:   No results found. Recent Labs    03/12/22 0456  WBC 8.7  HGB 9.6*  HCT 30.9*  PLT 308   Recent Labs    03/12/22 0456  NA 136  K 3.8  CL 101  CO2 29  GLUCOSE 290*  BUN 14  CREATININE 0.43*  CALCIUM 8.8*    Intake/Output Summary (Last 24 hours) at 03/13/2022 1042 Last data filed at 03/13/2022 0126 Gross per 24 hour  Intake 240 ml  Output 1500 ml  Net -1260 ml     Pressure Injury 03/08/22 Buttocks Left Deep Tissue Pressure Injury - Purple or maroon localized area of discolored intact skin or blood-filled blister due to damage of underlying soft tissue from pressure and/or shear. Present on admission to rehab (Active)  03/08/22   Location: Buttocks  Location Orientation: Left  Staging: Deep Tissue Pressure Injury - Purple or maroon localized area of discolored intact skin or blood-filled blister due to damage of underlying soft tissue from pressure and/or shear.  Wound Description (Comments): Present on admission to rehab  Present on Admission: Yes    Physical Exam: Vital Signs Blood pressure 130/87, pulse 92, temperature 97.6 F (36.4 C), temperature source Oral, resp. rate 18, weight 98.1 kg, SpO2 97 %. Gen: no distress, normal appearing, BMI 31.94 HEENT: oral mucosa pink and moist, NCAT Cardio: Tachycardic Chest: CTAB, normal effort, normal rate of breathing Abd: soft, non-distended, +BS Ext: no edema Psych: pleasant, normal affect Skin: left buttocks deep tissue injury, abdominal wound dressing in place, +ostomy Neuro: 5/5 strength BUE, 4/5 BLE   Assessment/Plan: 1. Functional deficits which require 3+ hours per day of interdisciplinary therapy in a comprehensive  inpatient rehab setting. Physiatrist is providing close team supervision and 24 hour management of active medical problems listed below. Physiatrist and rehab team continue to assess barriers to discharge/monitor patient progress toward functional and medical goals  Care Tool:  Bathing    Body parts bathed by patient: Right arm, Left arm, Chest, Abdomen, Right upper leg, Left upper leg, Face         Bathing assist Assist Level: Maximal Assistance - Patient 24 - 49% (Per OT Eval)     Upper Body Dressing/Undressing Upper body dressing   What is the patient wearing?: Pull over shirt    Upper body assist Assist Level: Minimal Assistance - Patient > 75%    Lower Body Dressing/Undressing Lower body dressing      What is the patient wearing?: Pants     Lower body assist Assist for lower body dressing: Maximal Assistance - Patient 25 - 49%     Toileting Toileting    Toileting assist Assist for toileting: Total Assistance - Patient < 25%     Transfers Chair/bed transfer  Transfers assist     Chair/bed transfer assist level: Moderate Assistance - Patient 50 - 74%     Locomotion Ambulation   Ambulation assist   Ambulation activity did not occur: Safety/medical concerns  Walk 10 feet activity   Assist  Walk 10 feet activity did not occur: Safety/medical concerns        Walk 50 feet activity   Assist Walk 50 feet with 2 turns activity did not occur: Safety/medical concerns         Walk 150 feet activity   Assist Walk 150 feet activity did not occur: Safety/medical concerns         Walk 10 feet on uneven surface  activity   Assist Walk 10 feet on uneven surfaces activity did not occur: Safety/medical concerns         Wheelchair     Assist Is the patient using a wheelchair?: Yes Type of Wheelchair: Manual    Wheelchair assist level: Supervision/Verbal cueing Max wheelchair distance: 75 ft    Wheelchair 50 feet with  2 turns activity    Assist        Assist Level: Supervision/Verbal cueing   Wheelchair 150 feet activity     Assist      Assist Level: Total Assistance - Patient < 25%   Blood pressure 130/87, pulse 92, temperature 97.6 F (36.4 C), temperature source Oral, resp. rate 18, weight 98.1 kg, SpO2 97 %.  Medical Problem List and Plan: 1. Functional deficits secondary to debility             -patient may shower             -ELOS/Goals: 10-14 days S         Continue CIR 2.  Antithrombotics: -DVT/anticoagulation:  Mechanical: Sequential compression devices, below knee Bilateral lower extremities             -antiplatelet therapy: N/A 3. Pain Management: tylenol prn 4. Mood/Behavior/Sleep: LCSW to follow for evaluation and support.              -antipsychotic agents: N/A 5. Neuropsych/cognition: This patient is capable of making decisions on his own behalf. 6. Skin/Wound Care: Wet to dry dressing changes daily to midline.              --continue Zinc. Add vitamin C  7. Fluids/Electrolytes/Nutrition: Monitor I/O. Check CMET in am             -- 8. Sigmoid perforation s/p resection w/abscess: Continue Duricef and Flagyl thru 12/26 9. Rectal ulcers:  Continue short chain fatty acid edemas daily, changed timing to 4pm as per patient's preferences. Please find out from GI how long these should continue 10. Crohn's disease: Remicade given 12/20 and repeat dose in 2 weeks per GI             --continue prednisone 40 mg X 2 weeks followed by 5 mg/week taper.   11. T2DM: Hgb A1C-6.1 and BS poorly controlled due to steroids/immobility.             --will monitor BS ac/hs and titrate insulin as indicated.             --continue Levemir 14 units with  6 units novolog for meal coverage.   -12/26 Increase Levemir to 16u  Supplement magnesium to goal od 2  CBG (last 3)  Recent Labs    03/12/22 1716 03/12/22 2107 03/13/22 0537  GLUCAP 304* 308* 155*    12. Fluid overload/Pleural  effusions: Daily wts with monitoring for overload             --intake improved and may need to add Low salt restrictions. Monitor daily weights   Filed  Weights   03/09/22 1700 03/12/22 0948 03/13/22 0500  Weight: 117.3 kg 98.4 kg 98.1 kg     13. Hypomagnesemia:  -Mg 1.3-->1.5 and till low. Will add oral supplement.             12/23: Mag is 1.4, ordered 2grams IV supplement             12/24: repeat magnesium ordered today  12/26 MG is 1.5, called pharmacy-recommended 4g x1 and recheck in AM, appreciate assistance  2grams IV mag ordered 12/27.  14. Severe OSA: Discussed need for CPAP/oxygen does not treat underling issues             --declines CPAP "too much at this time/can't lie on the left" 15. Tachycardic: EKG ordered and shows sinus tachycardia. May be 2/2 steroids, check magnesium level today. Discussed magnesium can help with tachycardia  -HR around 100, improved      LOS: 5 days A FACE TO FACE EVALUATION WAS PERFORMED  Clide Deutscher Toshia Larkin 03/13/2022, 10:42 AM

## 2022-03-13 NOTE — Progress Notes (Signed)
Ordered ostomy supplies 5 each for discharge to home. Will keep in office till discharge.

## 2022-03-13 NOTE — Progress Notes (Addendum)
Physical Therapy Session Note  Patient Details  Name: Richard [name] MRN: [id number] Date of Birth: 1955/08/30  Today's Date: 03/13/2022 PT Individual Time: 1100-1200 PT Individual Time Calculation (min): 60 min   Short Term Goals: Week 1:  PT Short Term Goal 1 (Week 1): pt will perform supine to/from sit w/mod assist of 1 PT Short Term Goal 2 (Week 1): pt will perform sit to stand from elevated surface w/mod assist of 2 PT Short Term Goal 3 (Week 1): Pt will transfer bed <> w/c w/ min A PT Short Term Goal 4 (Week 1): Pt will negotiate w/c 14' w/ supervision PT Short Term Goal 5 (Week 1): PT will assess gait.  Skilled Therapeutic Interventions/Progress Updates:      Therapy Documentation Precautions:  Precautions Precautions: None Precaution Comments: L colostomy, monitor O2/HR Restrictions Weight Bearing Restrictions: No  Pt agreeable to PT with emphasis on transfer and gait training. Pt reports right peri-scapular soreness and PT provided soft tissue mobilization and trigger point therapy to affected area for relief. Attempted STS from manual w/c with max A from PT. Pt required (S) for safety with w/c propulsion ~150 ft to main gym and min/mod A for squat pivot to mat table. Pt's current w/c height is 20 inches from the ground. Initially pt able to stand with RW from mat elevated 24 inch and progressed performing transfer from mat height of 22 inches. Pt ambulated 30 ft CGA with RW with close w/c follow and presents with narrow base of support and required verbal cueing for self-correction. Pt encouraged by ability to ambulate and then participated in blocked practice of STS transfers from mat height of 22 inches with and without w/c cushion to simulate w/c transfer. Pt performed 3 x 6 of gluteal bridges and required verbal cues for technique and min A for mat mobility. Pt propelled w/c ~100 ft and transported remaining distance to room. Pt's spouse filled out home measurement sheet and  provided it to therapist. Pt left seated in w/c at bedside with all needs in reach.    Therapy/Group: Individual Therapy  Verl Dicker Verl Dicker PT, DPT  03/13/2022, 7:48 AM

## 2022-03-13 NOTE — Progress Notes (Signed)
Occupational Therapy Session Note  Patient Details  Name: Richard [name] MRN: [id number] Date of Birth: May 03, 1955  Today's Date: 03/13/2022 OT Individual Time:1st Session [phone], 2nd Session  1300-1400 OT Individual Time Calculation (min): 75 min, 60 min    Short Term Goals: Week 1:  OT Short Term Goal 1 (Week 1): Patient will perform UB dressing with set up assist OT Short Term Goal 2 (Week 1): Patient will stand for clothing management with Mod assist of one with grab bar OT Short Term Goal 3 (Week 1): Patient will perform toilet transfer with Mod assist of one squat pivot OT Short Term Goal 4 (Week 1): Patient will be educated in use of AE for LE dressing and bathing OT Short Term Goal 5 (Week 1): Patient will perform LE bathing with Mod assist  Skilled Therapeutic Interventions/Progress Updates:   1st Session:  Pt seen for skilled OT session this am. Focus of session to progress self care from bed level to sink side in w/c as well as use of adaptive methods for hair washing as pt refusing showers currently with dressings covered.  OT also attempted to discuss urinal use and discourage purewick with nusing present as pt would require urinal/toilet use at home. Pt reluctant to try and states he will "purchase purewick."  OT will revisit urinal set up and mirror trial next session if pt agrees as OT confident in pt progressing this with ease especially with wife's support. Pt able to move from supine to sit at EOB after purewick disconnected by OT with clean line mngt with S. Lateral transfer to w/c with CGA and cues as pt tends to be limited by fear. Pt assisted OT in hair tray holding and lathering shampoo w/c level backed into shower stall for HH shower hose use for adapted hair washing. OT managed w/c in and out of bathroom. Sink side hair combing, oral care and grooming with set up only w/c level. Pull over shirt with set up. All colostomy care done by nursing at this time thus pt Dep  level for urine and stool management. Pt fearful for sit to stand to RW in front of sink. OT required total A for sit to stand. Moved to [facility] use with nurse tech present for increased pt cofindence and able to move sit to stand x 2 trials with max A due to limited ability for proximal strength to power up to stand. Once standing, pt relied on knee extension for stability. OT completed incont brief and shorts donning with total A. Pt was able to perform knee leg cross over technique to doff socks and don clean socks w/c level. Pt with HR 116 after activity then HR 92 with brief rest. Pt set up with tray table, safety needs and nurse call button in reach at end of visit.   2nd Session:  Pt's wife present during session and concerned pt spoke with MD to try and move d/c date up. Pt will need to ascend 4 steps in garage to access home and to access path to office therefore wife was in support of team rec for 03/28/21 at current time unless significant progress can be made prior to allow for steps safety and completion. OT also brought up pt's fear of urinal use d/t spillage and shower reluctance. Wife and pt taken into demo apt to problem solve with OT needs. Pt has lage glass surround walk in shower with 5" threshold lip. Pt's mother is giving them a shower seat  and wife to pick up tomorrow and place in shower at home and photograph for team to assist with simulation and recommendations. Pt will also have [facility] services to assist once discharged which reassured pt's reported fears. Pt currently using purewick most of day and night and disconnected for therapy. Pt agreeable to work with OT and wife on urinal use bed level as wife does not want pt to purchase purewick for home use and learn to use urinal and toilet.  Pt was able to self propel from hallway of unit to room, OT assisted with w/c set up for lateral transfer to 24" height bed to simulate home. Required small scoots and increased time but CGA. Pt able to  doff shirt EOB then move to supine with close S. Pt bridged for pull of pants removal with min A. OT trained in use of hand held mirror and male urinal and pt was able to void 600 cc's into urinal successfully with HOB elevated and LB of bed flat. OT encouraged practicing between visits with wife and nursing if purewick not in place. Left pt bed level with bed alarm active, needs and nurse call button in place.    Therapy Documentation Precautions:  Precautions Precautions: None Precaution Comments: L colostomy, monitor O2/HR Restrictions Weight Bearing Restrictions: No    Therapy/Group: Individual Therapy  Richard [name] 03/13/2022, 7:47 AM

## 2022-03-14 DIAGNOSIS — F329 Major depressive disorder, single episode, unspecified: Secondary | ICD-10-CM

## 2022-03-14 DIAGNOSIS — R5381 Other malaise: Secondary | ICD-10-CM | POA: Diagnosis not present

## 2022-03-14 LAB — GLUCOSE, CAPILLARY
Glucose-Capillary: 165 mg/dL — ABNORMAL HIGH (ref 70–99)
Glucose-Capillary: 228 mg/dL — ABNORMAL HIGH (ref 70–99)
Glucose-Capillary: 315 mg/dL — ABNORMAL HIGH (ref 70–99)
Glucose-Capillary: 360 mg/dL — ABNORMAL HIGH (ref 70–99)

## 2022-03-14 MED ORDER — INSULIN DETEMIR 100 UNIT/ML ~~LOC~~ SOLN
18.0000 [IU] | Freq: Every day | SUBCUTANEOUS | Status: DC
  Administered 2022-03-14 – 2022-03-19 (×6): 18 [IU] via SUBCUTANEOUS
  Filled 2022-03-14 (×7): qty 0.18

## 2022-03-14 NOTE — Progress Notes (Signed)
Pt refused  cpap. Not equip in room.

## 2022-03-14 NOTE — Progress Notes (Signed)
PROGRESS NOTE   Subjective/Complaints:   Pt reports no rectal bleeding- colostomy has some hard stool in it- pt doesn't think "hard".   Using purewick at night.  Doing OK per pt- no particular issues, except tearful and wants to go home.   We discussed nursing issues this AM.   Per PA, should get abd wound changed 2x/day, but only wants 1x/day.  ROS:  Pt denies SOB, abd pain, CP, N/V/C/D, and vision changes   Objective:   No results found. Recent Labs    03/12/22 0456  WBC 8.7  HGB 9.6*  HCT 30.9*  PLT 308   Recent Labs    03/12/22 0456  NA 136  K 3.8  CL 101  CO2 29  GLUCOSE 290*  BUN 14  CREATININE 0.43*  CALCIUM 8.8*    Intake/Output Summary (Last 24 hours) at 03/14/2022 2637 Last data filed at 03/14/2022 0700 Gross per 24 hour  Intake 828.67 ml  Output 2650 ml  Net -1821.33 ml     Pressure Injury 03/08/22 Buttocks Left Deep Tissue Pressure Injury - Purple or maroon localized area of discolored intact skin or blood-filled blister due to damage of underlying soft tissue from pressure and/or shear. Present on admission to rehab (Active)  03/08/22   Location: Buttocks  Location Orientation: Left  Staging: Deep Tissue Pressure Injury - Purple or maroon localized area of discolored intact skin or blood-filled blister due to damage of underlying soft tissue from pressure and/or shear.  Wound Description (Comments): Present on admission to rehab  Present on Admission: Yes    Physical Exam: Vital Signs Blood pressure 135/84, pulse 97, temperature 98.6 F (37 C), temperature source Oral, resp. rate 16, weight 98 kg, SpO2 99 %.    General: awake, alert, appropriate, tearful at times; NAD HENT: conjugate gaze; oropharynx moist CV: regular rate borderline tachycardic;  no JVD Pulmonary: CTA B/L; no W/R/R- good air movement GI: soft, NT, ND, (+)BS- abd wound covered- pt didn't want it checked today   Psychiatric: appropriate- tearful at times- wanting to go home;  Neurological: Ox3  Psych: pleasant, normal affect Skin: left buttocks deep tissue injury, abdominal wound dressing in place, +colostomy- has some pretty firm stool in colostomy Neuro: 5/5 strength BUE, 4/5 BLE   Assessment/Plan: 1. Functional deficits which require 3+ hours per day of interdisciplinary therapy in a comprehensive inpatient rehab setting. Physiatrist is providing close team supervision and 24 hour management of active medical problems listed below. Physiatrist and rehab team continue to assess barriers to discharge/monitor patient progress toward functional and medical goals  Care Tool:  Bathing    Body parts bathed by patient: Right arm, Left arm, Chest, Right upper leg, Left upper leg, Face, Abdomen   Body parts bathed by helper: Right lower leg, Left lower leg, Front perineal area, Buttocks     Bathing assist Assist Level: Maximal Assistance - Patient 24 - 49%     Upper Body Dressing/Undressing Upper body dressing   What is the patient wearing?: Pull over shirt    Upper body assist Assist Level: Set up assist    Lower Body Dressing/Undressing Lower body dressing  What is the patient wearing?: Pants, Incontinence brief     Lower body assist Assist for lower body dressing: Maximal Assistance - Patient 25 - 49%     Toileting Toileting    Toileting assist Assist for toileting: Maximal Assistance - Patient 25 - 49%     Transfers Chair/bed transfer  Transfers assist     Chair/bed transfer assist level: Moderate Assistance - Patient 50 - 74%     Locomotion Ambulation   Ambulation assist   Ambulation activity did not occur: Safety/medical concerns          Walk 10 feet activity   Assist  Walk 10 feet activity did not occur: Safety/medical concerns        Walk 50 feet activity   Assist Walk 50 feet with 2 turns activity did not occur: Safety/medical  concerns         Walk 150 feet activity   Assist Walk 150 feet activity did not occur: Safety/medical concerns         Walk 10 feet on uneven surface  activity   Assist Walk 10 feet on uneven surfaces activity did not occur: Safety/medical concerns         Wheelchair     Assist Is the patient using a wheelchair?: Yes Type of Wheelchair: Manual    Wheelchair assist level: Supervision/Verbal cueing Max wheelchair distance: 75 ft    Wheelchair 50 feet with 2 turns activity    Assist        Assist Level: Supervision/Verbal cueing   Wheelchair 150 feet activity     Assist      Assist Level: Total Assistance - Patient < 25%   Blood pressure 135/84, pulse 97, temperature 98.6 F (37 C), temperature source Oral, resp. rate 16, weight 98 kg, SpO2 99 %.  Medical Problem List and Plan: 1. Functional deficits secondary to debility             -patient may shower             -ELOS/Goals: 10-14 days S         Con't CIR- PT and OT-  2.  Antithrombotics: -DVT/anticoagulation:  Mechanical: Sequential compression devices, below knee Bilateral lower extremities             -antiplatelet therapy: N/A 3. Pain Management: tylenol prn 4. Mood/Behavior/Sleep: LCSW to follow for evaluation and support.              -antipsychotic agents: N/A 5. Neuropsych/cognition: This patient is capable of making decisions on his own behalf. 6. Skin/Wound Care: Wet to dry dressing changes daily to midline.              --continue Zinc. Add vitamin C  7. Fluids/Electrolytes/Nutrition: Monitor I/O. Check CMET in am             -- 8. Sigmoid perforation s/p resection w/abscess: Continue Duricef and Flagyl thru 12/26 9. Rectal ulcers:  Continue short chain fatty acid edemas daily, changed timing to 4pm as per patient's preferences. Please find out from GI how long these should continue  12/28- con't Flagyl per 4 weeks pending course of remicade.  10. Crohn's disease: Remicade  given 12/20 and repeat dose in 2 weeks per GI             --continue prednisone 40 mg X 2 weeks followed by 5 mg/week taper.   11. T2DM: Hgb A1C-6.1 and BS poorly controlled due to steroids/immobility.             --  will monitor BS ac/hs and titrate insulin as indicated.             --continue Levemir 14 units with  6 units novolog for meal coverage.   -12/26 Increase Levemir to 16u  12/28- will increase levemir to 18 units since still running 150s to 300s- can increase Meal coverage tomorrow.   CBG (last 3)  Recent Labs    03/13/22 1647 03/13/22 2041 03/14/22 0556  GLUCAP 304* 288* 165*    12. Fluid overload/Pleural effusions: Daily wts with monitoring for overload             --intake improved and may need to add Low salt restrictions. Monitor daily weights   Filed Weights   03/12/22 0948 03/13/22 0500 03/14/22 0557  Weight: 98.4 kg 98.1 kg 98 kg     13. Hypomagnesemia:  -Mg 1.3-->1.5 and till low. Will add oral supplement.             12/23: Mag is 1.4, ordered 2grams IV supplement             12/24: repeat magnesium ordered today  12/26 MG is 1.5, called pharmacy-recommended 4g x1 and recheck in AM, appreciate assistance  2grams IV mag ordered 12/27.   12/28- will recheck in AM and determine what what else is needed for Mg.  14. Severe OSA: Discussed need for CPAP/oxygen does not treat underling issues             --declines CPAP "too much at this time/can't lie on the left" 15. Tachycardic: EKG ordered and shows sinus tachycardia. May be 2/2 steroids, check magnesium level today. Discussed magnesium can help with tachycardia  -HR around 100, improved    I spent a total of 39   minutes on total care today- >50% coordination of care- due to d/w PA about BG's as well as staff about nursing issues     LOS: 6 days A FACE TO FACE EVALUATION WAS PERFORMED  Evan Osburn 03/14/2022, 9:22 AM

## 2022-03-14 NOTE — Consult Note (Signed)
[facility] [place] Nurse ostomy consult note Follow-up with patient and wife to address pouch concerns, answer questions and follow-up on supply needs.    Will touch base with case manager on supplies and rehab for support on pouch changes.    [facility] nursing team will remain available to patient and follow-up weekly.  Thanks [employer] MSN, RN-BC, [employer]

## 2022-03-14 NOTE — Consult Note (Signed)
Neuropsychological Consultation   Patient:   Richard [name]   DOB:   05-19-1955  MR Number:  [id number]  Location:  [facility] [address] [facility] [address] [place] [id number] [place] [id number] Dept: [place]: [phone]           Date of Service:   03/13/2022  Start Time:   3 PM End Time:   4 PM  Provider/Observer:  Ilean Skill, Psy.D.       Clinical Neuropsychologist       Billing Code/Service: [phone]  Reason for Service:    Richard [name] is a 66 year old male referred for neuropsychological consultation due to coping and adjustment issues during his extended hospital stay and ongoing treatment through the physical medicine and comprehensive inpatient rehabilitation program.  The patient has a past medical history including type 2 diabetes, severe obstructive sleep apnea, previous diagnosis of ADHD, hypertension, and recent diagnosis of IBD.  Patient was admitted to [facility] on 02/07/2022 with perforated bowel requiring emergent exploratory laparoscopically with resection of descending and sigmoid colon.  Hospital course was complicated and included a period of encephalopathic status due to pneumonia and complicated recovery process.  Patient on IV antibiotics.  Patient continued to have medical issues after admission onto the rehab unit on 03/01/2022 and at 1 point was readmitted back to acute hospital for closer monitoring due to recurrent rectal bleeding.  Patient has continued with rehabilitative efforts.  There has been a lot of stress and coping issues with the patient having a very long hospital stay and a lot of psychosocial stressors particularly around his lack of ability to do his work/job for some time now.  The patient has a lot of anxiety and stress regarding not only his ongoing medical issues and slow recovery but also the impact this is having on his life overall.  Patient is aware of the  severity of his medical status and issues.   Below is the HPI for the current admission for convenience:  HPI: Richard [name] is a 66 year old male with history of T2DM, severe OSA, ADHD, HTN, scrotal abscess s/p I&D 08/23, recent diagnosis of IBD who was admitted to [facility] on 02/07/22 with perforated bowel with peritonitis requiring removal emergent exploratory lap with mobilization splenic flexure, resection of descending and sigmoid colon with transverse colostomy by Dr. [name].  Hospital course was significant for ileus, encephalopathy due to PNA, abdominal distention with nausea requiring TNA as well as issues with fluid overload.  He developed anemia with rectal bleeding due to rectal varices with multiple ulcers in the mid and distal rectum as well as repeat grade mid rectal ulcer that was treated with hemostat spray.  He was also found to have LLQ abscess positive for E coli and bacteroides species that was treated with drain placement and initiation of IV Zosyn and Flagyl per Dr. Alcario Drought input.     PT OT was working with patient and CIR was recommended due to functional decline.  He was admitted to rehab on 03/01/2022 and continued to have issues with fluid overload as well as purulent drainage from his LLQ drain.  Cardiology was consulted and was started on IV diuresis.  He  had recurrent rectal bleeding due to multiple ulcers in distal colon tx with PuraSTAT gel by Dr. Ellis Savage. CBC showed stable H/H and colon mucosa has significantly improved on flex sig per GI. He had recurrent rectal bleeding that night  and was transferred to acute hospital for closer monitoring.   He was started on trial of short chain fatty enema daily and Remicade administered 12/20 after discussion of risks v/s benefits. 2D echo showed 12/19 showed EF 60-65% with  no wall abnormality and trivial pericardial effusion. CT abdomen pelvis showed resolution of abscess therefore drain pulled 12/20  and he was transitioned to  [facility] and Flagyl thru 12/26. Cardiology has been following for IV diuresis and has signed off. Hypomagnesemia has been due to poor intake/GI losses has been supplemented. He was transitioned from solumedrol to prednisone today with plans for 20 mg X 2 weeks followed by taper of 5 mg/wk. Rectal bleeding has resolved. Po intake has been good and he has been weaned off daytime oxygen.  PT/OT evaluations completed and CIR recommended due to ongoing issues with weakness and debility. No new complaints this morning.  Current Status:  Patient was awake and alert but acknowledged ongoing cognitive issues that while have improved significantly continue to be consistent with slowed information processing speed and some difficulties with retrieval of previously learned information.  All of these cognitive issues have improved and he has made significant improvement from his acute encephalopathic status earlier in his hospitalization.  The patient acknowledges significant stress and concern about how far he has gotten behind with work and that he was already behind on work projects prior to his acute medical illness.  The patient is already projecting himself forward in time with continued significant difficulties in trying to problem solve how he will get caught up with his work situation.  There were times today with sudden onset of crying spells and distress.  Behavioral Observation: Richard [name]  presents as a 66 y.o.-year-old Right handed Caucasian Male who appeared his stated age. his dress was Appropriate and he was Well Groomed and his manners were Appropriate to the situation.  his participation was indicative of Appropriate and Redirectable behaviors.  There were physical disabilities noted.  he displayed an appropriate level of cooperation and motivation.    Interactions:    Active Appropriate  Attention:   abnormal and attention span appeared shorter than expected for age  Memory:   abnormal; remote  memory intact, recent memory impaired  Visuo-spatial:  not examined  Speech (Volume):  normal  Speech:   normal; normal  Thought Process:  Coherent and Relevant  Though Content:  Rumination; not suicidal and not homicidal  Orientation:   person, place, time/date, and situation  Judgment:   Good  Planning:   Fair  Affect:    Anxious and Depressed  Mood:    Dysphoric  Insight:   Good  Intelligence:   high   Medical History:   Past Medical History:  Diagnosis Date   Adult ADHD    Allergic rhinitis    Anxiety    claustrophobic   Bronchopneumonia 11/03/2013   Colon polyps 2012 and 2013   All hyperplastic except one adenomatous w/out high grade dysplasia (recall 2023 per Dr. Deatra Ina)   COVID-19 virus infection 09/29/2020   Elevated transaminase level 2017   Viral hep screening neg.  Suspect fatty liver.   Hyperlipidemia    Hypertension    Inflammatory bowel disease    01/2022   Large bowel perforation ([place]) 02/2022   delayed, s/p colonoscopy w/biopsies (new dx crohn's/inflammatory BD)   Obesity    OSA on CPAP    Severe.  CPAP 10 cm H2O.  Follows Dr. Ander Slade.   Perineal abscess  2022/2023, recurrent-->fistula->Dr. [name] to do surg   Pneumonia    Seborrheic dermatitis of scalp    and face: hydrocort 2% cream bid prn to face, and fluocinonide 0.5% sol'n to scalp bid prn per Dr. Allyson Sabal (04/2014)   Secondary male hypogonadism 04/2021   Type 2 diabetes mellitus with microalbuminuria ([place]) DM dx-09/27/2015. Microalb 2020         Patient Active Problem List   Diagnosis Date Noted   Depressive reaction 03/14/2022   GI bleed 03/05/2022   Hematochezia 03/04/2022   Rectal ulcer 03/04/2022   Debility 03/01/2022   Perforated abdominal viscus 02/28/2022   Intra-abdominal abscess ([place]) 02/28/2022   Abnormal colonoscopy 02/28/2022   Erythematous rash 02/28/2022   Sepsis ([place]) 02/27/2022   History of colectomy 02/27/2022   Left lower quadrant abdominal abscess ([place])  02/27/2022   Rectal ulceration 02/27/2022   Acute lower GI bleeding 02/27/2022   Crohn's disease of large intestine with other complication ([place]) 19/14/7829   Acute generalized peritonitis ([place]) 02/27/2022   Inflammatory bowel disease 56/21/3086   Acute metabolic encephalopathy 57/84/6962   Rectal bleeding 02/25/2022   Abnormal CT scan, gastrointestinal tract 02/25/2022   Type 2 diabetes mellitus with hyperglycemia, without long-term current use of insulin ([place]) 02/23/2022   Anxiety 02/23/2022   Hypertension 02/23/2022   Colon polyps 02/23/2022   Colostomy in place [facility]) 02/23/2022   ABLA (acute blood loss anemia) 02/23/2022   Ileus, postoperative ([place]) 02/19/2022   Crohn's disease of colon with complication ([place]) 95/28/4132   Protein-calorie malnutrition, severe 02/13/2022   Perforated sigmoid colon ([place]) 02/08/2022   Borderline hypertension 09/27/2015   Lateral epicondylitis of left elbow 01/27/2015   Fracture of toe, closed 05/18/2014   Seborrheic dermatitis 02/28/2014   Subacromial bursitis 11/09/2013   Cerumen impaction 11/03/2013   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  At this point in his recovery this is not playing any significant role in the patient's acute encephalopathic status was directly related to his serious medical status.  Anxiety has also been an issue along with acute depressive response currently.  Family Med/Psych History:  Family History  Problem Relation Age of Onset   Lung cancer Father    Cancer Father        nasal    Allergies Father    Colon cancer Neg Hx    Esophageal cancer Neg Hx    Rectal cancer Neg Hx    Stomach cancer Neg Hx    Impression/DX:  Richard [name] is a 66 year old male referred for neuropsychological consultation due to coping and adjustment issues during his extended hospital stay and ongoing treatment through the physical medicine and comprehensive inpatient rehabilitation program.  The patient has a past medical history including type 2 diabetes, severe obstructive sleep apnea, previous diagnosis of ADHD, hypertension, and recent diagnosis of IBD.  Patient was admitted to [facility] on 02/07/2022 with perforated bowel requiring emergent exploratory laparoscopically with resection of descending and sigmoid colon.  Hospital course was complicated and included a period of encephalopathic status due to pneumonia and complicated recovery process.  Patient on IV  antibiotics.  Patient continued to have medical issues after admission onto the rehab unit on 03/01/2022 and at 1 point was readmitted back to acute hospital for closer monitoring due to recurrent rectal bleeding.  Patient has continued with rehabilitative efforts.  There has been a lot of stress and coping issues with the patient having a very long hospital stay and a lot of psychosocial stressors particularly around his lack of ability to do his work/job for some time now.  The patient has a lot of anxiety and stress regarding not only his ongoing medical issues and slow recovery but also the impact this is having on his life overall.  Patient is aware of the severity of his medical status and issues.  Patient was awake and alert but acknowledged ongoing cognitive issues that while have improved significantly continue to be consistent with slowed information processing speed and some difficulties with retrieval of previously learned information.  All of these cognitive issues have improved and he has made significant improvement from  his acute encephalopathic status earlier in his hospitalization.  The patient acknowledges significant stress and concern about how far he has gotten behind with work and that he was already behind on work projects prior to his acute medical illness.  The patient is already projecting himself forward in time with continued significant difficulties in trying to problem solve how he will get caught up with his work situation.  There were times today with sudden onset of crying spells and distress.  Disposition/Plan:  Today we worked on coping and adjustment issues and as the patient is cognitive functioning has improved he is able to process better but continues to catastrophize his current situation being projected into the future without an understanding and acceptance that he will continue to improve medically and his cognitive status is already making significant improvements.  Diagnosis:    Adjustment disorder with reactive depression and anxiety response         Electronically Signed   _______________________ Ilean Skill, Psy.D. Clinical Neuropsychologist

## 2022-03-14 NOTE — Progress Notes (Signed)
[facility] Progress Note  CC: Severe Crohn's disease, sigmoid perforation, rectal ulcers  Subjective: He is sitting up in the chair, walked 170 feet today with physical therapy.  No nausea or vomiting.  His appetite is good.  His colostomy output consists of brown thick stool.  No bloody or black stool.  No chest pain or shortness of breath.  His wife is at the bedside.   Objective:  Vital signs in last 24 hours: Temp:  [98.4 F (36.9 C)-98.9 F (37.2 C)] 98.6 F (37 C) (12/28 0557) Pulse Rate:  [97-110] 97 (12/28 0557) Resp:  [16-18] 16 (12/28 0557) BP: (121-135)/(75-84) 135/84 (12/28 0557) SpO2:  [93 %-99 %] 99 % (12/28 0557) Weight:  [98 kg] 98 kg (12/28 0557) Last BM Date : 03/14/22 General: 66 year old male in no acute distress. Heart: Regular rate and rhythm, no murmurs. Pulm: Breath sounds clear, decreased in the bases. Abdomen: Soft, nontender.  Midline dressing intact.  Left abdominal colostomy stoma beefy red with a moderate amount of thick brown stool in collection bag. Extremities: Mild bilateral lower extremity edema. Neurologic:  Alert and  oriented x 4. Grossly normal neurologically. Psych:  Alert and cooperative. Normal mood and affect.  Intake/Output from previous day: 12/27 0701 - 12/28 0700 In: 828.7 [P.O.:612; IV Piggyback:216.7] Out: 2650 [Urine:1700; Stool:950] Intake/Output this shift: No intake/output data recorded.  Lab Results: Recent Labs    03/12/22 0456  WBC 8.7  HGB 9.6*  HCT 30.9*  PLT 308   BMET Recent Labs    03/12/22 0456  NA 136  K 3.8  CL 101  CO2 29  GLUCOSE 290*  BUN 14  CREATININE 0.43*  CALCIUM 8.8*   LFT No results for input(s): "PROT", "ALBUMIN", "AST", "ALT", "ALKPHOS", "BILITOT", "BILIDIR", "IBILI" in the last 72 hours. PT/INR No results for input(s): "LABPROT", "INR" in the last 72 hours. Hepatitis Panel No results for input(s): "HEPBSAG", "HCVAB", "HEPAIGM", "HEPBIGM" in the last 72  hours.  No results found.  Assessment / Plan:  27) 66 year old male with newly diagnosed severe, penetrating colonic Crohn's disease 01/2022.  - Admitted with sigmoid perforation, requiring emergent surgery on 02/07/2022 with descending/sigmoid colectomy, transverse colostomy. -12/11 sigmoidoscopy performed emergently and notable for deep cratered rectal ulcers with bleeding stigmata, treated with PuraSTAT gel -02/26/22 IR drainage large LLQ abscess, grew E coli, bacteroides. Flagyl since 12/13, Rocephin since 12/15.  Drain interrogated (no fistula, collapsed/resolved abscess) and removed on 12/20.   -12/18 repeat flexible sigmoidoscopy for recurrent bleed.  Cratered ulcers, but overall improved from the previous sigmoidoscopy.  Again treated any stigmata with PuraSTAT gel with resolution -12/20 started Remicade 12/20 along with scfa enemas for possible overlapping diversion colitis. -12/21 changed IV Solu-Medrol to prednisone 40 mg/day -He is doing well on current Crohn's therapy. -Continue prednisone 40 mg/day x 2 weeks (03/21/2022), then taper -Remicade given on 12/20, dose #2 will be due 2 weeks from then (about 03/20/2022) -Continue short-chain fatty acid enemas daily for now.  Tentative plan for 2 weeks of therapy then reevaluate need for ongoing scfa -Continue Flagyl with tentative plan for 4-week course pending response to Remicade - Hepatitis B vaccine series as outpatient -Await further recommendations per Dr. Lorenso Courier  2) Macrocytic anemia. Hg 9.6, MCV 103 on 03/12/2022. B12 level 1429 on 02/16/2022. -CBC in am   3) Diabetes. Glucose levels have been difficult to control due to steroids.  Hopefully will improve when weaned off steroids - Management per primary service  Principal Problem:   Debility Active Problems:   Crohn's disease of large intestine with other complication ([place])     LOS: 6 days   Noralyn Pick  03/14/2022, 10:43 AM

## 2022-03-14 NOTE — Progress Notes (Signed)
Physical Therapy Session Note  Patient Details  Name: Richard [name] MRN: [id number] Date of Birth: 1955/08/02  Today's Date: 03/14/2022 PT Individual Time: 0905-1015, 7989-2119 PT Individual Time Calculation (min): 70 min , 74 min   Short Term Goals: Week 1:  PT Short Term Goal 1 (Week 1): pt will perform supine to/from sit w/mod assist of 1 PT Short Term Goal 2 (Week 1): pt will perform sit to stand from elevated surface w/mod assist of 2 PT Short Term Goal 3 (Week 1): Pt will transfer bed <> w/c w/ min A PT Short Term Goal 4 (Week 1): Pt will negotiate w/c 64' w/ supervision PT Short Term Goal 5 (Week 1): PT will assess gait.  Skilled Therapeutic Interventions/Progress Updates:      Therapy Documentation Precautions:  Precautions Precautions: None Precaution Comments: L colostomy, monitor O2/HR Restrictions Weight Bearing Restrictions: No  Treatment Session 1:   Pt agreeable to PT session and declines pain. Pt reports able to perform bed to chair transfers with supervision and was in good spirits. Pt transported total A for time management and energy conservation. Pt requires min A with squat pivot transfer from w/c to mat and CGA with STS from mat leveled 22 inches. Pt ambulated total of 107 feet with CGA with RW with close w/c follow and demonstrates increased step length and reciprocal gait.  Pt abel to progress to STS from mat from 20 inch height. Attempted sit to stand transfer from w/c and pt unsuccessful. Pt transitioned to LE strengthening exercises and performed Kinetron at 30 cm/sec x 10 minutes to increase LE activity tolerance and strength. Pt returned to room by w/c total A and left seated in w/c at bedside with all needs in reach.    Treatment Session 2:  Pt agreeable to PT session and without verbal reports of pain. Pt propelled w/c ~50 ft (S) for safety and transported remaining distance to outside atrium for community integration and to improve mood. Pt requires  mod A with squat pivot transfer from w/c to bench. Pt performed resisted hip abduction with green theraband 4 x 10 and green theraband resisted knee extension B 3 x 10. Pt reports calf tightness and therapist performed passive flexibility to gastroc and grade 1 ankle mobilizations to increase ROM. Pt required max A with return squat pivot transfer to chair and transported total A for time management to room. Pt min A with bed to chair transfer and supervision for sit to lying and bridging while PT removed shorts. Pt left semi-reclined in bed with all needs in reach and alarm on.    Therapy/Group: Individual Therapy  Verl Dicker Verl Dicker PT, DPT  03/14/2022, 7:42 AM

## 2022-03-14 NOTE — Progress Notes (Signed)
Occupational Therapy Session Note  Patient Details  Name: Richard [name] MRN: [id number] Date of Birth: 1955-05-30  Today's Date: 03/14/2022 OT Individual Time: 2703-5009 OT Individual Time Calculation (min): 41 min    Short Term Goals: Week 1:  OT Short Term Goal 1 (Week 1): Patient will perform UB dressing with set up assist OT Short Term Goal 2 (Week 1): Patient will stand for clothing management with Mod assist of one with grab bar OT Short Term Goal 3 (Week 1): Patient will perform toilet transfer with Mod assist of one squat pivot OT Short Term Goal 4 (Week 1): Patient will be educated in use of AE for LE dressing and bathing OT Short Term Goal 5 (Week 1): Patient will perform LE bathing with Mod assist  Skilled Therapeutic Interventions/Progress Updates:    Patient agreeable to participate in OT session. Reports right shoulder pain when rolling to the left in bed and when donning pull over shirt. No number provided. Monitored during session.   Purewick seal was off on the bottom portion and it was removed to complete hygiene and don new brief.   Patient participated in skilled OT session focusing on ADL re-training and functional transfers. Patient completed bed mobility including rolling right and left with Min A. Able to transition to seated EOB with HOB flat with SBA. Therapist facilitated session while pt completed UB dressing seated on EOB and LB dressing both seated EOB and supine to pull up shorts. Pt only required assistance with brief change and pt was able to don shirt, shorts, and non-slip socks without physical assistance. Lateral scoot from bed to w/c completed with SBA also. Pt then brushed teeth at sink with Mod I.     Therapy Documentation Precautions:  Precautions Precautions: None Precaution Comments: L colostomy, monitor O2/HR Restrictions Weight Bearing Restrictions: No  Therapy/Group: Individual Therapy  Ailene Ravel, OTR/L,CBIS  Supplemental OT -  [facility]  03/14/2022, 7:46 AM

## 2022-03-15 LAB — GLUCOSE, CAPILLARY
Glucose-Capillary: 175 mg/dL — ABNORMAL HIGH (ref 70–99)
Glucose-Capillary: 208 mg/dL — ABNORMAL HIGH (ref 70–99)
Glucose-Capillary: 277 mg/dL — ABNORMAL HIGH (ref 70–99)
Glucose-Capillary: 315 mg/dL — ABNORMAL HIGH (ref 70–99)

## 2022-03-15 LAB — MAGNESIUM: Magnesium: 1.4 mg/dL — ABNORMAL LOW (ref 1.7–2.4)

## 2022-03-15 MED ORDER — DOCUSATE SODIUM 100 MG PO CAPS
100.0000 mg | ORAL_CAPSULE | Freq: Every day | ORAL | Status: DC
  Administered 2022-03-15 – 2022-03-25 (×11): 100 mg via ORAL
  Filled 2022-03-15 (×11): qty 1

## 2022-03-15 MED ORDER — SODIUM CHLORIDE 0.9 % IV SOLN
5.0000 mg/kg | Freq: Once | INTRAVENOUS | Status: AC
  Administered 2022-03-20: 500 mg via INTRAVENOUS
  Filled 2022-03-15: qty 50

## 2022-03-15 MED ORDER — MAGNESIUM SULFATE 4 GM/100ML IV SOLN
4.0000 g | Freq: Once | INTRAVENOUS | Status: AC
  Administered 2022-03-15: 4 g via INTRAVENOUS
  Filled 2022-03-15: qty 100

## 2022-03-15 NOTE — Progress Notes (Signed)
PROGRESS NOTE   Subjective/Complaints:  Pt reports has therapy til 4:45 so was worried when would do short fatty chain enema- we agreed can be right after lunch per his therapy schedule. Will try to make sure therapy will be done by 3:45 daily.   Purewick almost full- No bleeding from rectum/with BM's-  Wife asking about needed consistency of Colostomy output- explained it's a little too formed- will add Colace.   Will talk to pharmacy about Remicade infusion- 4 hours next Wednesday.  Also will speak to therapy about scheduling that day.   ROS:  Pt denies SOB, abd pain, CP, N/V/C/D, and vision changes No bleeding with BM's  Objective:   No results found. No results for input(s): "WBC", "HGB", "HCT", "PLT" in the last 72 hours.  No results for input(s): "NA", "K", "CL", "CO2", "GLUCOSE", "BUN", "CREATININE", "CALCIUM" in the last 72 hours.   Intake/Output Summary (Last 24 hours) at 03/15/2022 0809 Last data filed at 03/15/2022 0100 Gross per 24 hour  Intake 240 ml  Output 1800 ml  Net -1560 ml     Pressure Injury 03/08/22 Buttocks Left Deep Tissue Pressure Injury - Purple or maroon localized area of discolored intact skin or blood-filled blister due to damage of underlying soft tissue from pressure and/or shear. Present on admission to rehab (Active)  03/08/22   Location: Buttocks  Location Orientation: Left  Staging: Deep Tissue Pressure Injury - Purple or maroon localized area of discolored intact skin or blood-filled blister due to damage of underlying soft tissue from pressure and/or shear.  Wound Description (Comments): Present on admission to rehab  Present on Admission: Yes    Physical Exam: Vital Signs Blood pressure 120/80, pulse (!) 104, temperature 98.4 F (36.9 C), temperature source Oral, resp. rate 16, weight 99 kg, SpO2 97 %.     General: awake, alert, appropriate, sitting up eating breakfast;  wife in room; NAD HENT: conjugate gaze; oropharynx moist CV: regular rate; no JVD Pulmonary: CTA B/L; no W/R/R- good air movement GI: soft, NT, ND, (+)BS- abd wound C/D/I covered with dressing- colostomy in place Psychiatric: appropriate but flat affect Neurological: Ox3  Psych: pleasant, normal affect Skin: left buttocks deep tissue injury, abdominal wound dressing in place, +colostomy- has some pretty firm stool in colostomy- about the same today Neuro: 5/5 strength BUE, 4/5 BLE   Assessment/Plan: 1. Functional deficits which require 3+ hours per day of interdisciplinary therapy in a comprehensive inpatient rehab setting. Physiatrist is providing close team supervision and 24 hour management of active medical problems listed below. Physiatrist and rehab team continue to assess barriers to discharge/monitor patient progress toward functional and medical goals  Care Tool:  Bathing    Body parts bathed by patient: Right arm, Left arm, Chest, Right upper leg, Left upper leg, Face, Abdomen   Body parts bathed by helper: Right lower leg, Left lower leg, Front perineal area, Buttocks     Bathing assist Assist Level: Maximal Assistance - Patient 24 - 49%     Upper Body Dressing/Undressing Upper body dressing   What is the patient wearing?: Pull over shirt    Upper body assist Assist Level: Set up assist  Lower Body Dressing/Undressing Lower body dressing      What is the patient wearing?: Pants, Incontinence brief     Lower body assist Assist for lower body dressing: Maximal Assistance - Patient 25 - 49%     Toileting Toileting    Toileting assist Assist for toileting: Maximal Assistance - Patient 25 - 49%     Transfers Chair/bed transfer  Transfers assist     Chair/bed transfer assist level: Moderate Assistance - Patient 50 - 74%     Locomotion Ambulation   Ambulation assist   Ambulation activity did not occur: Safety/medical concerns           Walk 10 feet activity   Assist  Walk 10 feet activity did not occur: Safety/medical concerns        Walk 50 feet activity   Assist Walk 50 feet with 2 turns activity did not occur: Safety/medical concerns         Walk 150 feet activity   Assist Walk 150 feet activity did not occur: Safety/medical concerns         Walk 10 feet on uneven surface  activity   Assist Walk 10 feet on uneven surfaces activity did not occur: Safety/medical concerns         Wheelchair     Assist Is the patient using a wheelchair?: Yes Type of Wheelchair: Manual    Wheelchair assist level: Supervision/Verbal cueing Max wheelchair distance: 75 ft    Wheelchair 50 feet with 2 turns activity    Assist        Assist Level: Supervision/Verbal cueing   Wheelchair 150 feet activity     Assist      Assist Level: Total Assistance - Patient < 25%   Blood pressure 120/80, pulse (!) 104, temperature 98.4 F (36.9 C), temperature source Oral, resp. rate 16, weight 99 kg, SpO2 97 %.  Medical Problem List and Plan: 1. Functional deficits secondary to debility             -patient may shower             -ELOS/Goals: 10-14 days S         Ccon't CIR- PT and OT- will also ask therapy to stop by 3:45 as well as inform them about Remicade next Wednesday for 4 hours.  2.  Antithrombotics: -DVT/anticoagulation:  Mechanical: Sequential compression devices, below knee Bilateral lower extremities             -antiplatelet therapy: N/A 3. Pain Management: tylenol prn 4. Mood/Behavior/Sleep: LCSW to follow for evaluation and support.              -antipsychotic agents: N/A 5. Neuropsych/cognition: This patient is capable of making decisions on his own behalf. 6. Skin/Wound Care: Wet to dry dressing changes daily to midline.              --continue Zinc. Add vitamin C  7. Fluids/Electrolytes/Nutrition: Monitor I/O. Check CMET in am             - 8. Sigmoid perforation s/p  resection w/abscess: Continue Duricef and Flagyl thru 12/26 9. Rectal ulcers:  Continue short chain fatty acid edemas daily, changed timing to 4pm as per patient's preferences. Please find out from GI how long these should continue  12/28- con't Flagyl per 4 weeks pending course of remicade.   12/29- spoke with pharmacy- will try to arrange remicade for next Wednesday- will let therapy know since will take 4 hours 10.  Crohn's disease: Remicade given 12/20 and repeat dose in 2 weeks per GI             --continue prednisone 40 mg X 2 weeks followed by 5 mg/week taper. 12/29- will start taper as of 03/21/22   11. T2DM: Hgb A1C-6.1 and BS poorly controlled due to steroids/immobility.             --will monitor BS ac/hs and titrate insulin as indicated.             --continue Levemir 14 units with  6 units novolog for meal coverage.   -12/26 Increase Levemir to 16u  12/28- will increase levemir to 18 units since still running 150s to 300s- can increase Meal coverage tomorrow.   12/29- just got levemir increase last night- this AM 175- so doing a little better- will wait to increase insulin for now  CBG (last 3)  Recent Labs    03/14/22 1644 03/14/22 2105 03/15/22 0617  GLUCAP 315* 360* 175*    12. Fluid overload/Pleural effusions: Daily wts with monitoring for overload             --intake improved and may need to add Low salt restrictions. Monitor daily weights   Filed Weights   03/13/22 0500 03/14/22 0557 03/15/22 0456  Weight: 98.1 kg 98 kg 99 kg     13. Hypomagnesemia:  -Mg 1.3-->1.5 and till low. Will add oral supplement.             12/23: Mag is 1.4, ordered 2grams IV supplement             12/24: repeat magnesium ordered today  12/26 MG is 1.5, called pharmacy-recommended 4g x1 and recheck in AM, appreciate assistance  2grams IV mag ordered 12/27.   12/28- will recheck in AM and determine what what else is needed for Mg.   12/29- will give 4 G IV since Mg down to 1.4 this AM-  will recheck in AM 14. Severe OSA: Discussed need for CPAP/oxygen does not treat underling issues             --declines CPAP "too much at this time/can't lie on the left" 15. Tachycardic: EKG ordered and shows sinus tachycardia. May be 2/2 steroids, check magnesium level today. Discussed magnesium can help with tachycardia  -HR around 100, improved    I spent a total of 53   minutes on total care today- >50% coordination of care- due to prolonged d/w nursing, Pharmacy and therapy as well as with wife about plans.    LOS: 7 days A FACE TO FACE EVALUATION WAS PERFORMED  Jaslyne Beeck 03/15/2022, 8:09 AM

## 2022-03-15 NOTE — Progress Notes (Signed)
Occupational Therapy Weekly Progress Note  Patient Details  Name: Richard [name] MRN: [id number] Date of Birth: 16-Mar-1956  Beginning of progress report period: March 09, 2022 End of progress report period: March 15, 2022  Today's Date: 03/15/2022 OT Individual Time: 3646-8032 OT Individual Time Calculation (min): 71 min    Patient has met 5 of 5 short term goals.  Pt has made excellent progress with his activity tolerance, ability to stand and transfer and tolerance to forward reaching to don clothing over feet.   Patient continues to demonstrate the following deficits: decreased cardiorespiratoy and muscular endurance and decreased standing balance and decreased balance strategies and therefore will continue to benefit from skilled OT intervention to enhance overall performance with BADL and iADL.  Patient progressing toward long term goals..  Continue plan of care.  OT Short Term Goals Week 1:  OT Short Term Goal 1 (Week 1): Patient will perform UB dressing with set up assist OT Short Term Goal 1 - Progress (Week 1): Met OT Short Term Goal 2 (Week 1): Patient will stand for clothing management with Mod assist of one with grab bar OT Short Term Goal 2 - Progress (Week 1): Met OT Short Term Goal 3 (Week 1): Patient will perform toilet transfer with Mod assist of one squat pivot OT Short Term Goal 3 - Progress (Week 1): Met OT Short Term Goal 4 (Week 1): Patient will be educated in use of AE for LE dressing and bathing OT Short Term Goal 4 - Progress (Week 1): Met OT Short Term Goal 5 (Week 1): Patient will perform LE bathing with Mod assist OT Short Term Goal 5 - Progress (Week 1): Met Week 2:  OT Short Term Goal 1 (Week 2): STGs = LTGs  Skilled Therapeutic Interventions/Progress Updates:    Pt and wife received in room and agreeable to therapy. Pt stated that he had overdone it in earlier PT session and pushed himself too much and he was completely out of energy to do any  standing or transfers. Pt was agreeable to discussing and problem solving several of their concerns as far as shower transfers and toileting with colostomy care.  Toileting: Due to body habitus, pt will not be able to effectively do colostomy care sitting on toilet facing front or back. He can sit in a chair facing toilet and straddling seat with his legs. Pt agreeable to practicing the positioning of this in his wc. Pt rolled into bathroom and towel placed to protect his pants/legs.  Pt did not need to empty bag yet but did practice leaning forward and simulating steps.  Pt felt this was a good option for him.  Discussed how at home he will need a lightweight arm chair that he can pull up behind him and then scoot forward to be able to straddle the toilet seat.  In the meantime, they have a sturdy shower seat with arms he could use.  Recommended have a small shelf nearby where he can reach small towels,wipes, or what ever he needs for the cleaning process.  For urinating, he used urinal or can sit forward.   Discussed how this can also get accomplished if he is traveling.  Pt was too tired to practice this, but demonstrated how he could sit back on a seat (toilet or chair and then try to stabilize cup between his legs to then use B hands to manage bag.  Discussed how to handle this in public restrooms and some options. Also  recommended he look to youtube for advice from other working professionals that have colostomys and how they manage. Shower: Pt's spouse had photos of shower set up.  Recommended she set shower seat at 21 inches as that is the height of his wc seat.  Set up a simulation of shower in his room using the shower box, but pt stated he would be unable to try that transfer now due to fatigue. Again, demonstrated how he should transfer by stepping backwards over the ledge with hands on walker and then reaching back for seat arm rest. His wife will have grab bars placed that he can also hold onto.   Discussed how to do the [facility] shower set up and he will need a wall attachment to reach shower head.   Discussed covering wound with tegaderm. Asked pt and wife to ask MD the recommendations for shower, if the abdominal wound needs to be covered, or if bandage can get wet then replaced or if should be totally open for shower.  Pt is hoping to feel strong enough to try shower early next week.    At end of session, pt opted to stay in wc for lunch.    Therapy Documentation Precautions:  Precautions Precautions: None Precaution Comments: L colostomy, monitor O2/HR Restrictions Weight Bearing Restrictions: No  Pain: Pain Assessment Pain Scale: 0-10 Pain Score: 0-No pain ADL: ADL Eating: Independent Where Assessed-Eating: Bed level Grooming: Independent Where Assessed-Grooming: Sitting at sink Upper Body Bathing: Setup Where Assessed-Upper Body Bathing: Sitting at sink Lower Body Bathing: Supervision/safety Where Assessed-Lower Body Bathing: Sitting at sink Upper Body Dressing: Setup Where Assessed-Upper Body Dressing: Wheelchair Lower Body Dressing: Supervision/safety Where Assessed-Lower Body Dressing: Wheelchair Toileting: Minimal assistance (colostomy care) Where Assessed-Toileting: Control and instrumentation engineer Transfer: Minimal assistance    Therapy/Group: Individual Therapy  Richard [name] 03/15/2022, 12:36 PM

## 2022-03-15 NOTE — Progress Notes (Signed)
Physical Therapy Session Note  Patient Details  Name: Richard [name] MRN: [id number] Date of Birth: 10-19-1955  Today's Date: 03/15/2022 PT Individual Time: 7737-5051 PT Individual Time Calculation (min): 70 min   Short Term Goals: Week 1:  PT Short Term Goal 1 (Week 1): pt will perform supine to/from sit w/mod assist of 1 PT Short Term Goal 2 (Week 1): pt will perform sit to stand from elevated surface w/mod assist of 2 PT Short Term Goal 3 (Week 1): Pt will transfer bed <> w/c w/ min A PT Short Term Goal 4 (Week 1): Pt will negotiate w/c 4' w/ supervision PT Short Term Goal 5 (Week 1): PT will assess gait.   Skilled Therapeutic Interventions/Progress Updates:   Pt received sitting in WC and agreeable to PT. WC mobility through hall with supervision assist x 123f with cues for direction and navigation of food cart in hall and attention to R side.  ADDITIONAL WC  Mobility x 71fwith supervision assist from PT.   Standing tolerance at Wii x 8 frames in standing prior to requesting rest brake. Mod assist for [facility]. To initiate standing tolerance.   Standing frame knee extension with buttock supported sling 2 x 8 with increasing height to allow additional reps 2/2 fatigue. Prolonged therapeutic rest break due to fatigue between bouts    [facility] x [id number] min-mod assist form WC with BUE pushing into standing position.   Sit>supine completed with supervision assist, and left supine in bed with call bell in reach and all needs met.       Therapy Documentation Precautions:  Precautions Precautions: None Precaution Comments: L colostomy, monitor O2/HR Restrictions Weight Bearing Restrictions: No    Vital Signs: Therapy Vitals Temp: 99 F (37.2 C) Pulse Rate: (!) 110 Resp: 17 BP: 113/65 Patient Position (if appropriate): Lying Oxygen Therapy SpO2: 93 % O2 Device: Room Air Pain: Pain Assessment Pain Scale: 0-10 Pain Score: 0-No  pain     Therapy/Group: Individual Therapy  AusLorie Phenix/29/2023, 4:55 PM

## 2022-03-15 NOTE — Progress Notes (Signed)
Physical Therapy Session Note  Patient Details  Name: Richard [name] MRN: [id number] Date of Birth: 15-Jan-1956  Today's Date: 03/15/2022 PT Individual Time: 0930-1015 PT Individual Time Calculation (min): 45 min   Short Term Goals: Week 1:  PT Short Term Goal 1 (Week 1): pt will perform supine to/from sit w/mod assist of 1 PT Short Term Goal 2 (Week 1): pt will perform sit to stand from elevated surface w/mod assist of 2 PT Short Term Goal 3 (Week 1): Pt will transfer bed <> w/c w/ min A PT Short Term Goal 4 (Week 1): Pt will negotiate w/c 28' w/ supervision PT Short Term Goal 5 (Week 1): PT will assess gait.  Skilled Therapeutic Interventions/Progress Updates:    Pt seated in w/c on arrival and agreeable to therapy. No complaint of pain, other than chronic abdominal discomfort. Sit to stand with min-light mod A during session to RW from w/c. Pt ambulated x 177 ft at start of session and x 90 ft at end of session with CGA and close w/c follow d/t fatigue. Pt with short steps and reciprocal gait pattern. No overt LOB or knee buckle noted. Pt then performed step ups on 3" steps wit BUE support x8 with alternating feet. Discontinued exercise after episode of R knee buckling, although pt was able to catch with only min a. Pt ambulated back toward room until fatigued and then propelled w/c with BLE x 20 ft. Pt remained in w/c after session and was left with all needs in reach and alarm active.    Therapy Documentation Precautions:  Precautions Precautions: None Precaution Comments: L colostomy, monitor O2/HR Restrictions Weight Bearing Restrictions: No General:       Therapy/Group: Individual Therapy  Mickel Fuchs 03/15/2022, 10:07 AM

## 2022-03-15 NOTE — Progress Notes (Signed)
[facility] Progress Note  CC:  Severe Crohn's disease, sigmoid perforation, rectal ulcers   Subjective: No complaints today.  He questions when he can take a shower.  He continues to have brown thick stool output from his colostomy.  No bloody or black stools.  Wife is at the bedside.   Objective:  Vital signs in last 24 hours: Temp:  [98.4 F (36.9 C)-99 F (37.2 C)] 99 F (37.2 C) (12/29 1337) Pulse Rate:  [104-110] 110 (12/29 1337) Resp:  [16-20] 17 (12/29 1337) BP: (105-120)/(64-80) 113/65 (12/29 1337) SpO2:  [93 %-97 %] 93 % (12/29 1337) Weight:  [99 kg] 99 kg (12/29 0456) Last BM Date : 03/14/22 General: 66 year old male in no acute distress. Heart: Regular rate and rhythm, no murmurs. Pulm: Sounds clear throughout. Abdomen: Obese abdomen, soft, mild tenderness to the right mid abdomen without rebound or guarding.  Midline abdominal dressing intact.  Left abdominal colostomy stoma beefy red with a small amount of thick brown stool in collection bag. Extremities: Mild bilateral lower extremity edema. Neurologic:  Alert and  oriented x 4. Grossly normal neurologically. Psych:  Alert and cooperative. Normal mood and affect.  Intake/Output from previous day: 12/28 0701 - 12/29 0700 In: 240 [P.O.:240] Out: 1800 [Urine:1800] Intake/Output this shift: Total I/O In: 474 [P.O.:474] Out: -   Lab Results: No results for input(s): "WBC", "HGB", "HCT", "PLT" in the last 72 hours. BMET No results for input(s): "NA", "K", "CL", "CO2", "GLUCOSE", "BUN", "CREATININE", "CALCIUM" in the last 72 hours. LFT No results for input(s): "PROT", "ALBUMIN", "AST", "ALT", "ALKPHOS", "BILITOT", "BILIDIR", "IBILI" in the last 72 hours. PT/INR No results for input(s): "LABPROT", "INR" in the last 72 hours. Hepatitis Panel No results for input(s): "HEPBSAG", "HCVAB", "HEPAIGM", "HEPBIGM" in the last 72 hours.  No results found.  Assessment / Plan:  49) 66 year old male  with newly diagnosed severe, penetrating colonic Crohn's disease 01/2022.  - Admitted with sigmoid perforation, requiring emergent surgery on 02/07/2022 with descending/sigmoid colectomy, transverse colostomy. -12/11 sigmoidoscopy performed emergently and notable for deep cratered rectal ulcers with bleeding stigmata, treated with PuraSTAT gel -02/26/22 IR drainage large LLQ abscess, grew E coli, bacteroides. Flagyl since 12/13, Rocephin since 12/15.  Drain interrogated (no fistula, collapsed/resolved abscess) and removed on 12/20.   -12/18 repeat flexible sigmoidoscopy for recurrent bleed.  Cratered ulcers, but overall improved from the previous sigmoidoscopy.  Again treated any stigmata with PuraSTAT gel with resolution -12/20 started Remicade 12/20 along with scfa enemas for possible overlapping diversion colitis. -12/21 changed IV Solu-Medrol to prednisone 40 mg/day He is doing well on current Crohn's therapy. Continue Prednisone 40 mg/day x 2 weeks (03/21/2022), then taper Remicade given on 12/20, dose #2 will be due 2 weeks from then (about 03/20/2022) Continue short-chain fatty acid enemas daily for now.  Tentative plan for 2 weeks of therapy then reevaluate need for ongoing scfa Continue Flagyl with tentative plan for 4-week course pending response to Remicade Hepatitis B vaccine series as outpatient Await further recommendations per Dr. Lorenso Courier   2) Macrocytic anemia. Hg 9.6, MCV 103 on 03/12/2022. B12 level 1429 on 02/16/2022. -CBC in am   3) Diabetes. Glucose levels have been difficult to control due to steroids.  Hopefully will improve when weaned off steroids - Management per primary service         Principal Problem:   Debility Active Problems:   Crohn's disease of large intestine with other complication (HCC)   Depressive reaction  LOS: 7 days   Noralyn Pick  03/15/2022, 2:35 PM

## 2022-03-15 NOTE — Progress Notes (Addendum)
Sent email to [facility] and pharmacy but never head back.  Called pharmacy they have Remicade on hand and as long as order is in place they are good on their end. Talked to patients wife who thinks the resource nurse was [place]. Unable to find under [facility]. Request call from IV team.   Talked to [facility] with [facility] and be here on Wednesday 01/03. Will contact her about an hour ahead of scheduled infusion, get VS and make sure he has no temp and will contact her for medication. Updated patient.

## 2022-03-15 NOTE — Progress Notes (Signed)
Occupational Therapy Session Note  Patient Details  Name: Richard [name] MRN: [id number] Date of Birth: 1955/08/03  Today's Date: 03/15/2022 OT Individual Time: 1445-1500 OT Individual Time Calculation (min): 15 min    Short Term Goals: Week 1:  OT Short Term Goal 1 (Week 1): Patient will perform UB dressing with set up assist OT Short Term Goal 1 - Progress (Week 1): Met OT Short Term Goal 2 (Week 1): Patient will stand for clothing management with Mod assist of one with grab bar OT Short Term Goal 2 - Progress (Week 1): Met OT Short Term Goal 3 (Week 1): Patient will perform toilet transfer with Mod assist of one squat pivot OT Short Term Goal 3 - Progress (Week 1): Met OT Short Term Goal 4 (Week 1): Patient will be educated in use of AE for LE dressing and bathing OT Short Term Goal 4 - Progress (Week 1): Met OT Short Term Goal 5 (Week 1): Patient will perform LE bathing with Mod assist OT Short Term Goal 5 - Progress (Week 1): Met Week 2:  OT Short Term Goal 1 (Week 2): STGs = LTGs  Skilled Therapeutic Interventions/Progress Updates:    1:1 Discussed earlier extended stay with previous OT. Pt in bed when greet. Briefly discussed goal of setting up shower stall like he has at home with armed chair and our simulated shower frame. Pt able to get to EOB with extra time mod I. Pt able to performed sit to stand from EOB with min A with extra time. Demonstrated and pt returned demonstrated stepping over the ledge of the shower frame backwards using the RW and then transitioning to chair and then stepping forwards out with RW. First attempt pt was not squared up with frame and tasks was more challenging but second time about to perform with contact guard. Pt ambulated over to w/c and left sitting up in prep for next session.  Viewed pictures of home shower setup and would benefit from stepping over a little taller of a ledge and wider to get closer to what he has at home. (It is about a 6 in  step he is stepping over that has a deeper width than our frame (double the width)  Therapy Documentation Precautions:  Precautions Precautions: None Precaution Comments: L colostomy, monitor O2/HR Restrictions Weight Bearing Restrictions: No  Pain: No c/o pain in session   Therapy/Group: Individual Therapy  Willeen Cass [facility] 03/15/2022, 3:31 PM

## 2022-03-16 LAB — MAGNESIUM: Magnesium: 1.5 mg/dL — ABNORMAL LOW (ref 1.7–2.4)

## 2022-03-16 LAB — GLUCOSE, CAPILLARY
Glucose-Capillary: 147 mg/dL — ABNORMAL HIGH (ref 70–99)
Glucose-Capillary: 187 mg/dL — ABNORMAL HIGH (ref 70–99)
Glucose-Capillary: 294 mg/dL — ABNORMAL HIGH (ref 70–99)
Glucose-Capillary: 275 mg/dL — ABNORMAL HIGH (ref 70–99)

## 2022-03-16 MED ORDER — MAGNESIUM OXIDE -MG SUPPLEMENT 400 (240 MG) MG PO TABS
400.0000 mg | ORAL_TABLET | Freq: Two times a day (BID) | ORAL | Status: DC
  Administered 2022-03-16 – 2022-03-19 (×7): 400 mg via ORAL
  Filled 2022-03-16 (×7): qty 1

## 2022-03-16 MED ORDER — INSULIN ASPART 100 UNIT/ML IJ SOLN
7.0000 [IU] | Freq: Three times a day (TID) | INTRAMUSCULAR | Status: DC
  Administered 2022-03-16 – 2022-03-18 (×6): 7 [IU] via SUBCUTANEOUS

## 2022-03-16 MED ORDER — MAGNESIUM SULFATE 4 GM/100ML IV SOLN
4.0000 g | Freq: Once | INTRAVENOUS | Status: AC
  Administered 2022-03-16: 4 g via INTRAVENOUS
  Filled 2022-03-16: qty 100

## 2022-03-16 NOTE — Progress Notes (Signed)
PROGRESS NOTE   Subjective/Complaints:  Pt reports he's upset that doesn't have therapy today.  Did well with [place].  Colostomy full- a little looser- less formed- which is good.   Mg 1.5- after 4 G IV Mg.  Thought d/c was 1/10- not 1/11- so will address at team conference Tuesday.  Willing to do PO Mg to keep Mg up.   ROS:   Pt denies SOB, abd pain, CP, N/V/C/D, and vision changes- no bleeding from rectum.   Objective:   No results found. No results for input(s): "WBC", "HGB", "HCT", "PLT" in the last 72 hours.  No results for input(s): "NA", "K", "CL", "CO2", "GLUCOSE", "BUN", "CREATININE", "CALCIUM" in the last 72 hours.   Intake/Output Summary (Last 24 hours) at 03/16/2022 1114 Last data filed at 03/16/2022 0739 Gross per 24 hour  Intake 714 ml  Output 2700 ml  Net -1986 ml     Pressure Injury 03/08/22 Buttocks Left Deep Tissue Pressure Injury - Purple or maroon localized area of discolored intact skin or blood-filled blister due to damage of underlying soft tissue from pressure and/or shear. Present on admission to rehab (Active)  03/08/22   Location: Buttocks  Location Orientation: Left  Staging: Deep Tissue Pressure Injury - Purple or maroon localized area of discolored intact skin or blood-filled blister due to damage of underlying soft tissue from pressure and/or shear.  Wound Description (Comments): Present on admission to rehab  Present on Admission: Yes    Physical Exam: Vital Signs Blood pressure 125/85, pulse 92, temperature 98.6 F (37 C), temperature source Oral, resp. rate 18, weight 93.7 kg, SpO2 100 %.      General: awake, alert, appropriate, wife [name] at bedside; NAD HENT: conjugate gaze; oropharynx moist CV: regular rate; no JVD Pulmonary: CTA B/L; no W/R/R- good air movement GI: soft, NT, ND, (+)BS; colostomy- full; and slightly looser still/less formed Psychiatric:  appropriate slightly less flat affect today Neurological: Ox3 Psych: pleasant, normal affect Skin: left buttocks deep tissue injury, abdominal wound dressing in place, +colostomy- has some pretty firm stool in colostomy- about the same today Neuro: 5/5 strength BUE, 4/5 BLE   Assessment/Plan: 1. Functional deficits which require 3+ hours per day of interdisciplinary therapy in a comprehensive inpatient rehab setting. Physiatrist is providing close team supervision and 24 hour management of active medical problems listed below. Physiatrist and rehab team continue to assess barriers to discharge/monitor patient progress toward functional and medical goals  Care Tool:  Bathing    Body parts bathed by patient: Right arm, Left arm, Chest, Right upper leg, Left upper leg, Face, Abdomen   Body parts bathed by helper: Right lower leg, Left lower leg, Front perineal area, Buttocks     Bathing assist Assist Level: Maximal Assistance - Patient 24 - 49%     Upper Body Dressing/Undressing Upper body dressing   What is the patient wearing?: Pull over shirt    Upper body assist Assist Level: Set up assist    Lower Body Dressing/Undressing Lower body dressing      What is the patient wearing?: Pants, Incontinence brief     Lower body assist Assist for lower body  dressing: Maximal Assistance - Patient 25 - 49%     Toileting Toileting    Toileting assist Assist for toileting: Maximal Assistance - Patient 25 - 49%     Transfers Chair/bed transfer  Transfers assist     Chair/bed transfer assist level: Moderate Assistance - Patient 50 - 74%     Locomotion Ambulation   Ambulation assist   Ambulation activity did not occur: Safety/medical concerns          Walk 10 feet activity   Assist  Walk 10 feet activity did not occur: Safety/medical concerns        Walk 50 feet activity   Assist Walk 50 feet with 2 turns activity did not occur: Safety/medical concerns          Walk 150 feet activity   Assist Walk 150 feet activity did not occur: Safety/medical concerns         Walk 10 feet on uneven surface  activity   Assist Walk 10 feet on uneven surfaces activity did not occur: Safety/medical concerns         Wheelchair     Assist Is the patient using a wheelchair?: Yes Type of Wheelchair: Manual    Wheelchair assist level: Supervision/Verbal cueing Max wheelchair distance: 75 ft    Wheelchair 50 feet with 2 turns activity    Assist        Assist Level: Supervision/Verbal cueing   Wheelchair 150 feet activity     Assist      Assist Level: Total Assistance - Patient < 25%   Blood pressure 125/85, pulse 92, temperature 98.6 F (37 C), temperature source Oral, resp. rate 18, weight 93.7 kg, SpO2 100 %.  Medical Problem List and Plan: 1. Functional deficits secondary to debility             -patient may shower             -ELOS/Goals: 10-14 days S         will also ask therapy to stop by 3:45 as well as inform them about Remicade next Wednesday for 4 hours.   12/30- cont' CIR- PT and OT- educated pt doesn't have therapy 7 days/week; only 6 days/week- d/c date 1/11- pt said supposed to be 1/10.  2.  Antithrombotics: -DVT/anticoagulation:  Mechanical: Sequential compression devices, below knee Bilateral lower extremities             -antiplatelet therapy: N/A 3. Pain Management: tylenol prn 4. Mood/Behavior/Sleep: LCSW to follow for evaluation and support.              -antipsychotic agents: N/A 5. Neuropsych/cognition: This patient is capable of making decisions on his own behalf. 6. Skin/Wound Care: Wet to dry dressing changes daily to midline.              --continue Zinc. Add vitamin C  7. Fluids/Electrolytes/Nutrition: Monitor I/O. Check CMET in am             - 8. Sigmoid perforation s/p resection w/abscess: Continue Duricef and Flagyl thru 12/26 9. Rectal ulcers:  Continue short chain fatty acid  edemas daily, changed timing to 4pm as per patient's preferences. Please find out from GI how long these should continue  12/28- con't Flagyl per 4 weeks pending course of remicade.   12/29- spoke with pharmacy- will try to arrange remicade for next Wednesday- will let therapy know since will take 4 hours  12/30- Pharmacy says doesn't need infusion monitoring 2nd time  like did first time.  10. Crohn's disease: Remicade given 12/20 and repeat dose in 2 weeks per GI             --continue prednisone 40 mg X 2 weeks followed by 5 mg/week taper. 12/29- will start taper as of 03/21/22   11. T2DM: Hgb A1C-6.1 and BS poorly controlled due to steroids/immobility.             --will monitor BS ac/hs and titrate insulin as indicated.             --continue Levemir 14 units with  6 units novolog for meal coverage.   -12/26 Increase Levemir to 16u  12/28- will increase levemir to 18 units since still running 150s to 300s- can increase Meal coverage tomorrow.   12/29- just got levemir increase last night- this AM 175- so doing a little better- will wait to increase insulin for now  12/30- Increase novolog 7 units TID  CBG (last 3)  Recent Labs    03/15/22 1641 03/15/22 2051 03/16/22 0606  GLUCAP 277* 315* 147*    12. Fluid overload/Pleural effusions: Daily wts with monitoring for overload             --intake improved and may need to add Low salt restrictions. Monitor daily weights   Filed Weights   03/14/22 0557 03/15/22 0456 03/16/22 0444  Weight: 98 kg 99 kg 93.7 kg     13. Hypomagnesemia:  -Mg 1.3-->1.5 and till low. Will add oral supplement.             12/23: Mag is 1.4, ordered 2grams IV supplement             12/24: repeat magnesium ordered today  12/26 MG is 1.5, called pharmacy-recommended 4g x1 and recheck in AM, appreciate assistance  2grams IV mag ordered 12/27.   12/28- will recheck in AM and determine what what else is needed for Mg.   12/29- will give 4 G IV since Mg down to  1.4 this AM- will recheck in AM  12/30- Mg 1.5- only slightly up with 4 G IV- pt agreed to start PO Mg 400 mg BID started 14. Severe OSA: Discussed need for CPAP/oxygen does not treat underling issues             --declines CPAP "too much at this time/can't lie on the left" 15. Tachycardic: EKG ordered and shows sinus tachycardia. May be 2/2 steroids, check magnesium level today. Discussed magnesium can help with tachycardia  -HR around 100, improved    I spent a total of 52   minutes on total care today- >50% coordination of care- due to education about grounds pass; PO vs IV Mg; and Remicade- and d/c date   LOS: 8 days A FACE TO FACE EVALUATION WAS PERFORMED  Henretter Piekarski 03/16/2022, 11:14 AM

## 2022-03-16 NOTE — Progress Notes (Signed)
Pt refused to wear CPAP tonight. ?

## 2022-03-17 LAB — GLUCOSE, CAPILLARY
Glucose-Capillary: 154 mg/dL — ABNORMAL HIGH (ref 70–99)
Glucose-Capillary: 220 mg/dL — ABNORMAL HIGH (ref 70–99)
Glucose-Capillary: 290 mg/dL — ABNORMAL HIGH (ref 70–99)
Glucose-Capillary: 246 mg/dL — ABNORMAL HIGH (ref 70–99)

## 2022-03-17 LAB — MAGNESIUM: Magnesium: 1.6 mg/dL — ABNORMAL LOW (ref 1.7–2.4)

## 2022-03-17 NOTE — Progress Notes (Signed)
Pt refused to wear CPAP tonight. ?

## 2022-03-17 NOTE — Plan of Care (Signed)
PT upgraded pt goals due to functional mobility improvements   Problem: RH Balance Goal: LTG Patient will maintain dynamic sitting balance (PT) Description: LTG:  Patient will maintain dynamic sitting balance with assistance during mobility activities (PT) Flowsheets (Taken 03/17/2022 1241) LTG: Pt will maintain dynamic sitting balance during mobility activities with:: Independent Note: Upgraded due to functional improvements  Goal: LTG Patient will maintain dynamic standing balance (PT) Description: LTG:  Patient will maintain dynamic standing balance with assistance during mobility activities (PT) Flowsheets (Taken 03/17/2022 1241) LTG: Pt will maintain dynamic standing balance during mobility activities with:: Independent with assistive device  Note: Upgraded due to functional improvements    Problem: RH Ambulation Goal: LTG Patient will ambulate in controlled environment (PT) Description: LTG: Patient will ambulate in a controlled environment, # of feet with assistance (PT). Flowsheets (Taken 03/17/2022 1241) LTG: Pt will ambulate in controlled environ  assist needed:: Supervision/Verbal cueing LTG: Ambulation distance in controlled environment: 150 ft Note: Upgraded due to functional improvements  Goal: LTG Patient will ambulate in home environment (PT) Description: LTG: Patient will ambulate in home environment, # of feet with assistance (PT). Flowsheets (Taken 03/17/2022 1241) LTG: Pt will ambulate in home environ  assist needed:: Supervision/Verbal cueing LTG: Ambulation distance in home environment: 75 ft Note: Upgraded due to functional improvements

## 2022-03-17 NOTE — Progress Notes (Signed)
Physical Therapy Weekly Progress Note  Patient Details  Name: Richard [name] MRN: [id number] Date of Birth: [birth year]  Beginning of progress report period: March 09, 2022 End of progress report period: March 17, 2022  Today's Date: 03/17/2022 PT Individual Time: 1104-1202 PT Individual Time Calculation (min): 58 min   Patient has met 4 of 4 short term goals.  Patient is making steady progress to long term goals due to improvements in activity tolerance and strength. Pt largely requires (S) for bed mobility, mod A for STS from w/c, min A squat pivot transfers from bed<>chair and CGA with gait ~177 ft with RW. Pt requires mod A for stair negotiation x 2 (6 inches). Plan to upgrade gait goals and continue pt/family education and stair training to prepare for discharge.   Patient continues to demonstrate the following deficits muscle weakness, decreased cardiorespiratoy endurance, and decreased standing balance and decreased balance strategies and therefore will continue to benefit from skilled PT intervention to increase functional independence with mobility.  Patient  is making steady progress and gait goals upgraded .  Plan of care revisions: Goals upgraded .  PT Short Term Goals Week 1:  PT Short Term Goal 1 (Week 1): pt will perform supine to/from sit w/mod assist of 1 PT Short Term Goal 1 - Progress (Week 1): Met PT Short Term Goal 2 (Week 1): pt will perform sit to stand from elevated surface w/mod assist of 2 PT Short Term Goal 2 - Progress (Week 1): Met PT Short Term Goal 3 (Week 1): Pt will transfer bed <> w/c w/ min A PT Short Term Goal 3 - Progress (Week 1): Met PT Short Term Goal 4 (Week 1): Pt will negotiate w/c 96' w/ supervision PT Short Term Goal 4 - Progress (Week 1): Met PT Short Term Goal 5 (Week 1): PT will assess gait. PT Short Term Goal 5 - Progress (Week 1): Met Week 2:  PT Short Term Goal 1 (Week 2): STG=LTG due ELOS  Skilled Therapeutic  Interventions/Progress Updates:      Therapy Documentation Precautions:  Precautions Precautions: None Precaution Comments: L colostomy, monitor O2/HR Restrictions Weight Bearing Restrictions: No  Pt agreeable to PT session with emphasis on gait, stair, and flexibility training. Pt without verbal reports of pain in session and requires several attempts before successful with mod A x 1 with STS from w/c with RW. Pt ambulated ~177 ft with RW to main gym CGA, plan to upgrade goals to supervision level. PT addressed decreased gastrocnemius flexibility by having pt perform standing with RW on inclined wedge 1 x 10 seconds bilaterally. Pt transitioned to stair training and initially required min/mod A for 3 inch height stairs with bilateral HR's. Pt progressed to 6 inch stair navigation x 2 with B HR's. Pt leans forward and utilizes forearms for weightbearing due to decreased gluteal strength. Plan to continue gluteal strengthening to improve stair navigation. Pt propelled w/c to room and PT provided soft tissue mobilization and trigger point therapy to right gastroc muscle and pt reports improvement. Pt left seated in w/c at bedside with all needs in reach.   Therapy/Group: Individual Therapy  Verl Dicker Verl Dicker PT, DPT  03/17/2022, 12:31 PM

## 2022-03-17 NOTE — Progress Notes (Signed)
[facility] Progress Note  CC:  Severe Crohn's disease, sigmoid perforation, rectal ulcers    Subjective: Patient is good.  No abdominal pain.  His wife has emptied his colostomy bag 3 to 4 times today and she questions if he is is having too much stool output.  Stool is soft, not thick or watery.  No blood or black stool.  No chest pain or shortness of breath.   Objective:  Vital signs in last 24 hours: Temp:  [98.7 F (37.1 C)-98.9 F (37.2 C)] 98.9 F (37.2 C) (12/31 1324) Pulse Rate:  [93-108] 108 (12/31 1324) Resp:  [16-18] 17 (12/31 1324) BP: (121-128)/(75-86) 128/78 (12/31 1324) SpO2:  [95 %-97 %] 95 % (12/31 1324) Weight:  [95.3 kg] 95.3 kg (12/31 0500) Last BM Date : 03/17/22 General: 66 year old male in no acute distress. Heart: Regular rate and rhythm, no murmurs. Pulm: Breath sounds clear, no crackles, wheezes or rhonchi. Abdomen: Obese abdomen, soft.  Nontender.  Active bowel sounds to all 4 quadrants.  Midline abdominal dressing intact.  Left abdominal colostomy stoma beefy red, no stool in the collection bag at this time. Extremities: Mild pedal edema bilaterally. Neurologic:  Alert and  oriented x 4. Grossly normal neurologically. Psych:  Alert and cooperative. Normal mood and affect.  Intake/Output from previous day: 12/30 0701 - 12/31 0700 In: 817 [P.O.:717; IV Piggyback:100] Out: 3300 [Urine:3300] Intake/Output this shift: Total I/O In: 474 [P.O.:474] Out: 300 [Urine:300]  Lab Results: No results for input(s): "WBC", "HGB", "HCT", "PLT" in the last 72 hours. BMET No results for input(s): "NA", "K", "CL", "CO2", "GLUCOSE", "BUN", "CREATININE", "CALCIUM" in the last 72 hours. LFT No results for input(s): "PROT", "ALBUMIN", "AST", "ALT", "ALKPHOS", "BILITOT", "BILIDIR", "IBILI" in the last 72 hours. PT/INR No results for input(s): "LABPROT", "INR" in the last 72 hours. Hepatitis Panel No results for input(s): "HEPBSAG", "HCVAB",  "HEPAIGM", "HEPBIGM" in the last 72 hours.  No results found.  Assessment / Plan:  43) 66 year old male with newly diagnosed severe, penetrating colonic Crohn's disease 01/2022.  Admitted to the hospital with sigmoid perforation, requiring emergent surgery on 02/07/2022 with descending/sigmoid colectomy, transverse colostomy. 12/11 sigmoidoscopy performed emergently and notable for deep cratered rectal ulcers with bleeding stigmata, treated with PuraSTAT gel 02/26/22 IR drainage large LLQ abscess, grew E coli, bacteroides. Flagyl since 12/13, Rocephin since 12/15.  Drain interrogated (no fistula, collapsed/resolved abscess) and removed on 12/20.   12/18 repeat flexible sigmoidoscopy for recurrent bleed.  Cratered ulcers, but overall improved from the previous sigmoidoscopy. Again treated any stigmata with PuraSTAT gel with resolution 12/20 started Remicade 12/20 along with scfa enemas for possible overlapping diversion colitis. 12/21 changed IV Solu-Medrol to Prednisone 40 mg/day -He is doing well on current Crohn's therapy. -Monitory colostomy stool output Q shift -Continue Prednisone 40 mg/day x 2 weeks (03/21/2022), then taper -Remicade given on 12/20, dose #2 will be due 2 weeks from then (about 03/20/2022) -Continue short-chain fatty acid enemas daily, first dose 03/09/2022. Tentative plan for 2 weeks of therapy then reevaluate need for ongoing scfa -Continue Flagyl with tentative plan for 4-week course pending response to Remicade -Hepatitis B vaccine series as outpatient -Await further recommendations per Dr. Lorenso Courier   2) Macrocytic anemia. Hg 9.6, MCV 103 on 03/12/2022. B12 level 1429 on 02/16/2022. -CBC in am   3) Diabetes. Glucose levels have been difficult to control due to steroids.  Hopefully will improve when weaned off steroids - Management per primary service  Principal Problem:   Debility Active Problems:   Crohn's disease of large intestine with other complication  ([place])   Depressive reaction     LOS: 9 days   Noralyn Pick  03/17/2022, 2:37 PM

## 2022-03-17 NOTE — Progress Notes (Signed)
PROGRESS NOTE   Subjective/Complaints:  Pt reports only has 1 therapy session today- asked PT if can get more therapy today.   Concerned about hospital bill- wants something on "1 page" not multiple bills.  Also concerned about therapy saying "not available 3-7pm- figured out due to getting enema.   Asking if can bath- was told need ot check with surgeon?   ROS:   Pt denies SOB, abd pain, CP, N/V/C/D, and vision changes   Objective:   No results found. No results for input(s): "WBC", "HGB", "HCT", "PLT" in the last 72 hours.  No results for input(s): "NA", "K", "CL", "CO2", "GLUCOSE", "BUN", "CREATININE", "CALCIUM" in the last 72 hours.   Intake/Output Summary (Last 24 hours) at 03/17/2022 0940 Last data filed at 03/17/2022 0745 Gross per 24 hour  Intake 817 ml  Output 2700 ml  Net -1883 ml     Pressure Injury 03/08/22 Buttocks Left Deep Tissue Pressure Injury - Purple or maroon localized area of discolored intact skin or blood-filled blister due to damage of underlying soft tissue from pressure and/or shear. Present on admission to rehab (Active)  03/08/22   Location: Buttocks  Location Orientation: Left  Staging: Deep Tissue Pressure Injury - Purple or maroon localized area of discolored intact skin or blood-filled blister due to damage of underlying soft tissue from pressure and/or shear.  Wound Description (Comments): Present on admission to rehab  Present on Admission: Yes    Physical Exam: Vital Signs Blood pressure 126/86, pulse 93, temperature 98.7 F (37.1 C), temperature source Oral, resp. rate 18, weight 95.3 kg, SpO2 97 %.       General: awake, alert, appropriate, sitting up in bed; wife at bedside; NAD HENT: conjugate gaze; oropharynx moist CV: regular rate; no JVD Pulmonary: CTA B/L; no W/R/R- good air movement GI: soft, NT, ND, (+)BS- Colostomy looks stable- looser stool- abd wound  C/D/I Psychiatric: appropriate Neurological: Ox3 Psych: pleasant, normal affect Skin: left buttocks deep tissue injury, abdominal wound dressing in place, +colostomy-less firm stool Neuro: 5/5 strength BUE, 4/5 BLE   Assessment/Plan: 1. Functional deficits which require 3+ hours per day of interdisciplinary therapy in a comprehensive inpatient rehab setting. Physiatrist is providing close team supervision and 24 hour management of active medical problems listed below. Physiatrist and rehab team continue to assess barriers to discharge/monitor patient progress toward functional and medical goals  Care Tool:  Bathing    Body parts bathed by patient: Right arm, Left arm, Chest, Right upper leg, Left upper leg, Face, Abdomen   Body parts bathed by helper: Right lower leg, Left lower leg, Front perineal area, Buttocks     Bathing assist Assist Level: Maximal Assistance - Patient 24 - 49%     Upper Body Dressing/Undressing Upper body dressing   What is the patient wearing?: Pull over shirt    Upper body assist Assist Level: Set up assist    Lower Body Dressing/Undressing Lower body dressing      What is the patient wearing?: Pants, Incontinence brief     Lower body assist Assist for lower body dressing: Maximal Assistance - Patient 25 - 49%     Toileting Toileting  Toileting assist Assist for toileting: Maximal Assistance - Patient 25 - 49%     Transfers Chair/bed transfer  Transfers assist     Chair/bed transfer assist level: Moderate Assistance - Patient 50 - 74%     Locomotion Ambulation   Ambulation assist   Ambulation activity did not occur: Safety/medical concerns          Walk 10 feet activity   Assist  Walk 10 feet activity did not occur: Safety/medical concerns        Walk 50 feet activity   Assist Walk 50 feet with 2 turns activity did not occur: Safety/medical concerns         Walk 150 feet activity   Assist Walk 150 feet  activity did not occur: Safety/medical concerns         Walk 10 feet on uneven surface  activity   Assist Walk 10 feet on uneven surfaces activity did not occur: Safety/medical concerns         Wheelchair     Assist Is the patient using a wheelchair?: Yes Type of Wheelchair: Manual    Wheelchair assist level: Supervision/Verbal cueing Max wheelchair distance: 75 ft    Wheelchair 50 feet with 2 turns activity    Assist        Assist Level: Supervision/Verbal cueing   Wheelchair 150 feet activity     Assist      Assist Level: Total Assistance - Patient < 25%   Blood pressure 126/86, pulse 93, temperature 98.7 F (37.1 C), temperature source Oral, resp. rate 18, weight 95.3 kg, SpO2 97 %.  Medical Problem List and Plan: 1. Functional deficits secondary to debility             -patient may shower             -ELOS/Goals: 10-14 days S         will also ask therapy to stop by 3:45 as well as inform them about Remicade next Wednesday for 4 hours.   12/30- cont' CIR- PT and OT- educated pt doesn't have therapy 7 days/week; only 6 days/week- d/c date 1/11- pt said supposed to be 1/10.   12/31- con't CIR- PT and OT- will check with surgeon- don't see a reason pt cannot bathe if covers dressings- but will double check- also asked if pt can get more therapy today. 2.  Antithrombotics: -DVT/anticoagulation:  Mechanical: Sequential compression devices, below knee Bilateral lower extremities             -antiplatelet therapy: N/A 3. Pain Management: tylenol prn 4. Mood/Behavior/Sleep: LCSW to follow for evaluation and support.              -antipsychotic agents: N/A 5. Neuropsych/cognition: This patient is capable of making decisions on his own behalf. 6. Skin/Wound Care: Wet to dry dressing changes daily to midline.              --continue Zinc. Add vitamin C  7. Fluids/Electrolytes/Nutrition: Monitor I/O. Check CMET in am             - 8. Sigmoid perforation  s/p resection w/abscess: Continue Duricef and Flagyl thru 12/26 9. Rectal ulcers:  Continue short chain fatty acid edemas daily, changed timing to 4pm as per patient's preferences. Please find out from GI how long these should continue  12/28- con't Flagyl per 4 weeks pending course of remicade.   12/29- spoke with pharmacy- will try to arrange remicade for next Wednesday- will let  therapy know since will take 4 hours  12/30- Pharmacy says doesn't need infusion monitoring 2nd time like did first time.  10. Crohn's disease: Remicade given 12/20 and repeat dose in 2 weeks per GI             --continue prednisone 40 mg X 2 weeks followed by 5 mg/week taper. 12/29- will start taper as of 03/21/22   11. T2DM: Hgb A1C-6.1 and BS poorly controlled due to steroids/immobility.             --will monitor BS ac/hs and titrate insulin as indicated.             --continue Levemir 14 units with  6 units novolog for meal coverage.   -12/26 Increase Levemir to 16u  12/28- will increase levemir to 18 units since still running 150s to 300s- can increase Meal coverage tomorrow.   12/29- just got levemir increase last night- this AM 175- so doing a little better- will wait to increase insulin for now  12/30- Increase novolog 7 units TID  12/31- Cbgs down to <300 at least- 154 this AM- give another day before changing  CBG (last 3)  Recent Labs    03/16/22 1609 03/16/22 2116 03/17/22 0636  GLUCAP 294* 275* 154*    12. Fluid overload/Pleural effusions: Daily wts with monitoring for overload             --intake improved and may need to add Low salt restrictions. Monitor daily weights   Filed Weights   03/15/22 0456 03/16/22 0444 03/17/22 0500  Weight: 99 kg 93.7 kg 95.3 kg     13. Hypomagnesemia:  -Mg 1.3-->1.5 and till low. Will add oral supplement.             12/23: Mag is 1.4, ordered 2grams IV supplement             12/24: repeat magnesium ordered today  12/26 MG is 1.5, called  pharmacy-recommended 4g x1 and recheck in AM, appreciate assistance  2grams IV mag ordered 12/27.   12/28- will recheck in AM and determine what what else is needed for Mg.   12/29- will give 4 G IV since Mg down to 1.4 this AM- will recheck in AM  12/30- Mg 1.5- only slightly up with 4 G IV- pt agreed to start PO Mg 400 mg BID started  12/31- will recheck in AM- Mg 1.6 today-  14. Severe OSA: Discussed need for CPAP/oxygen does not treat underling issues             --declines CPAP "too much at this time/can't lie on the left" 15. Tachycardic: EKG ordered and shows sinus tachycardia. May be 2/2 steroids, check magnesium level today. Discussed magnesium can help with tachycardia  -HR around 100, improved    I spent a total of 36   minutes on total care today- >50% coordination of care- due to d/w therapy- will try to get him more therapy today. Also d/w pt and wife about insurance and costs of care.    LOS: 9 days A FACE TO FACE EVALUATION WAS PERFORMED  Richard [name] 03/17/2022, 9:40 AM

## 2022-03-18 LAB — GLUCOSE, CAPILLARY
Glucose-Capillary: 128 mg/dL — ABNORMAL HIGH (ref 70–99)
Glucose-Capillary: 286 mg/dL — ABNORMAL HIGH (ref 70–99)
Glucose-Capillary: 320 mg/dL — ABNORMAL HIGH (ref 70–99)
Glucose-Capillary: 317 mg/dL — ABNORMAL HIGH (ref 70–99)

## 2022-03-18 LAB — MAGNESIUM: Magnesium: 1.5 mg/dL — ABNORMAL LOW (ref 1.7–2.4)

## 2022-03-18 MED ORDER — INSULIN ASPART 100 UNIT/ML IJ SOLN
9.0000 [IU] | Freq: Three times a day (TID) | INTRAMUSCULAR | Status: DC
  Administered 2022-03-18 – 2022-03-25 (×21): 9 [IU] via SUBCUTANEOUS

## 2022-03-18 MED ORDER — MAGNESIUM SULFATE 4 GM/100ML IV SOLN
4.0000 g | Freq: Once | INTRAVENOUS | Status: AC
  Administered 2022-03-18: 4 g via INTRAVENOUS
  Filled 2022-03-18: qty 100

## 2022-03-18 NOTE — Progress Notes (Signed)
Pt refused to wear C-PAP.

## 2022-03-18 NOTE — Progress Notes (Signed)
Occupational Therapy Session Note  Patient Details  Name: Richard [name] MRN: [id number] Date of Birth: 1955-05-15  Today's Date: 03/18/2022 OT Individual Time: 4944-9675 OT Individual Time Calculation (min): 73 min    Short Term Goals: Week 2:  OT Short Term Goal 1 (Week 2): STGs = LTGs  Skilled Therapeutic Interventions/Progress Updates:     Patient agreeable to participate in OT session. Reports 0/10 pain level.   Patient participated in skilled OT session focusing on therapeutic activities simulating set-up for emptying colostomy and fall recovery. Pt and wife were able to run through previous OT session with [name] when they practiced different set-ups for emptying colostomy. Pt and wife confirmed that best set-up included pt facing the toilet seated in w/c in a straddle position and leaning over toilet bowl to empty. Pt has not practiced hands on yet. Recommended using gloves and using a wipe to clean very inside edge of colostomy bag before rolling it up to close.   Therapist educated pt and wife on different techniques to get up off all the floor if he falls at home. Provided visual demonstration and VC. Pt performed lateral scoot transfer from w/c to mat table then from table to blue bench with SBA. From bench, pt was able to lower himself down to floor mat and transition into long sitting. Pt was educated to side sit on his right hip with his knees flexed and legs to the left side of him. Pt attempted to use mat table to assist himself up into a tall lunge with forearms on mat. Pt was unable to do maneuver unassisted. Therapist provided Min A to pt to bring right knee forward and transition to tall lunge position. Due to fatigue level, pt was unable to bring his upper body onto mat table and use right leg to push himself forward. Modified activity by bringing blue bench behind pt and with Min A pt was able to sit on bench. SB then used to transfer from bench to w/c with SBA (SB set-up  provided.) Pt complete 10 w/c pushups to increase UB strength needed to complete sit to stands.  Pt verbalized on-going right shoulder pain that he has been experiencing since being hospitalized. Palpated Min trigger points located in upper trapezius and anterior right deltoid. Completed trigger point release and passive stretching to decrease pain and increase joint mobility. Pt able to demonstrate improved active right shoulder flexion and verbalized decreased pain.     Therapy Documentation Precautions:  Precautions Precautions: None Precaution Comments: L colostomy, monitor O2/HR Restrictions Weight Bearing Restrictions: No  Therapy/Group: Individual Therapy  Ailene Ravel, OTR/L,CBIS  Supplemental OT - [facility]  03/18/2022, 7:46 AM

## 2022-03-18 NOTE — Progress Notes (Signed)
Physical Therapy Session Note  Patient Details  Name: Richard [name] MRN: [id number] Date of Birth: 1955/03/31  Today's Date: 03/18/2022 PT Individual Time: 0800-0913, 1115-12  PT Individual Time Calculation (min): 73 min, 45 min   Short Term Goals: Week 2:  PT Short Term Goal 1 (Week 2): STG=LTG due ELOS  Skilled Therapeutic Interventions/Progress Updates:      Therapy Documentation Precautions:  Precautions Precautions: None Precaution Comments: L colostomy, monitor O2/HR Restrictions Weight Bearing Restrictions: No  Treatment Session 1:  Pt agreeable to PT session with emphasis on gait and LE strength training. Pt without verbal reports of pain and also reports right calf tightness. PT performed STM and trigger point therapy and pt reports improvement in session. Pt requires min A with STS from w/c and ambulated ~177 ft CGA to main gym. Pt performed alternating standing calf stretches with RW on inclined red wedge 3 x 5 s bilaterally. Pt transitioned to LE strength exercises and performed 1 x 6 followed by 1 x 4 mini squats with tactile feedback and bilateral UE support on standard chair. Pt transitioned to 2 x 6 gluteal bridges and ambulated to ortho gym CGA with RW to view car simulator. Plan to practice car transfers in following sessions and PT discussed with pt and spouse on various strategies (slide board, squat pivot, stand pivot with RW) to perform car transfer. PT recommended pt utilize bed rail at home as pt requires mod A with lying to sit from mat. Pt left seated in w/c at bedside with all needs in reach.    Treatment Session 2:   Pt agreeable to PT session with emphasis on stair and car transfer training. Pt reports right shoulder pain and PT provided STM to posterior deltoids and pectoralis muscles for relief. Pt transported by w/c for energy conservation to main gym for stair training. Pt with multiple unsuccessful STS attempts from w/c and pt deferred stair training. Pt  transitioned to car transfer training via slideboard and requires (S) for safety. Pt's spouse educated and demonstrated competency with slide board placement in session. Plan to have pt utilize slide board for car transfers upon discharge for energy conservation. Pt reports he will order a transfer board online. Pt left seated in w/c at bedside with all needs in reach.    Therapy/Group: Individual Therapy  Richard [name] Richard [name] PT, DPT  03/18/2022, 7:45 AM

## 2022-03-18 NOTE — Progress Notes (Signed)
PROGRESS NOTE   Subjective/Complaints:  Pt reports getting washed up- with wife, [name].   Wife asking if handicapped placard appropriate.   Also discussed shower with current wounds/colostomy.     ROS:   Pt denies SOB, abd pain, CP, N/V/C/D, and vision changes Except for HPI  Objective:   No results found. No results for input(s): "WBC", "HGB", "HCT", "PLT" in the last 72 hours.  No results for input(s): "NA", "K", "CL", "CO2", "GLUCOSE", "BUN", "CREATININE", "CALCIUM" in the last 72 hours.   Intake/Output Summary (Last 24 hours) at 03/18/2022 0829 Last data filed at 03/18/2022 0630 Gross per 24 hour  Intake 717 ml  Output 4100 ml  Net -3383 ml     Pressure Injury 03/08/22 Buttocks Left Deep Tissue Pressure Injury - Purple or maroon localized area of discolored intact skin or blood-filled blister due to damage of underlying soft tissue from pressure and/or shear. Present on admission to rehab (Active)  03/08/22   Location: Buttocks  Location Orientation: Left  Staging: Deep Tissue Pressure Injury - Purple or maroon localized area of discolored intact skin or blood-filled blister due to damage of underlying soft tissue from pressure and/or shear.  Wound Description (Comments): Present on admission to rehab  Present on Admission: Yes    Physical Exam: Vital Signs Blood pressure 129/86, pulse 99, temperature 98.2 F (36.8 C), temperature source Oral, resp. rate 18, weight 98.5 kg, SpO2 97 %.        General: awake, alert, appropriate, wife at bedside; NAD HENT: conjugate gaze; oropharynx moist CV: borderline tachycardic rate; no JVD Pulmonary: CTA B/L; no W/R/R- good air movement GI: soft, NT, ND, (+)BS- colostomy- soft stool in bag; Abd wound C/D/I Psychiatric: appropriate- slightly irritable Neurological: Ox3 Psych: pleasant, normal affect Skin: left buttocks deep tissue injury, abdominal wound dressing in  place, +colostomy-less firm stool Neuro: 5/5 strength BUE, 4/5 BLE   Assessment/Plan: 1. Functional deficits which require 3+ hours per day of interdisciplinary therapy in a comprehensive inpatient rehab setting. Physiatrist is providing close team supervision and 24 hour management of active medical problems listed below. Physiatrist and rehab team continue to assess barriers to discharge/monitor patient progress toward functional and medical goals  Care Tool:  Bathing    Body parts bathed by patient: Right arm, Left arm, Chest, Right upper leg, Left upper leg, Face, Abdomen   Body parts bathed by helper: Right lower leg, Left lower leg, Front perineal area, Buttocks     Bathing assist Assist Level: Maximal Assistance - Patient 24 - 49%     Upper Body Dressing/Undressing Upper body dressing   What is the patient wearing?: Pull over shirt    Upper body assist Assist Level: Set up assist    Lower Body Dressing/Undressing Lower body dressing      What is the patient wearing?: Pants, Incontinence brief     Lower body assist Assist for lower body dressing: Maximal Assistance - Patient 25 - 49%     Toileting Toileting    Toileting assist Assist for toileting: Maximal Assistance - Patient 25 - 49%     Transfers Chair/bed transfer  Transfers assist     Chair/bed transfer  assist level: Minimal Assistance - Patient > 75%     Locomotion Ambulation   Ambulation assist   Ambulation activity did not occur: Safety/medical concerns  Assist level: Contact Guard/Touching assist Assistive device: Walker-rolling Max distance: 177 ft   Walk 10 feet activity   Assist  Walk 10 feet activity did not occur: Safety/medical concerns  Assist level: Contact Guard/Touching assist Assistive device: Walker-rolling   Walk 50 feet activity   Assist Walk 50 feet with 2 turns activity did not occur: Safety/medical concerns  Assist level: Contact Guard/Touching  assist Assistive device: Walker-rolling    Walk 150 feet activity   Assist Walk 150 feet activity did not occur: Safety/medical concerns  Assist level: Contact Guard/Touching assist Assistive device: Walker-rolling    Walk 10 feet on uneven surface  activity   Assist Walk 10 feet on uneven surfaces activity did not occur: Safety/medical concerns         Wheelchair     Assist Is the patient using a wheelchair?: Yes Type of Wheelchair: Manual    Wheelchair assist level: Supervision/Verbal cueing Max wheelchair distance: 177 ft    Wheelchair 50 feet with 2 turns activity    Assist        Assist Level: Supervision/Verbal cueing   Wheelchair 150 feet activity     Assist      Assist Level: Supervision/Verbal cueing   Blood pressure 129/86, pulse 99, temperature 98.2 F (36.8 C), temperature source Oral, resp. rate 18, weight 98.5 kg, SpO2 97 %.  Medical Problem List and Plan: 1. Functional deficits secondary to debility             -patient may shower             -ELOS/Goals: 10-14 days S         will also ask therapy to stop by 3:45 as well as inform them about Remicade next Wednesday for 4 hours.   12/30- cont' CIR- PT and OT- educated pt doesn't have therapy 7 days/week; only 6 days/week- d/c date 1/11- pt said supposed to be 1/10.   1/1- Con't CIR- PT and OT- will allow pt to shower- wasn't able ot reach surgeon, but we usually let pt's shower with current wounds/colostomy 2.  Antithrombotics: -DVT/anticoagulation:  Mechanical: Sequential compression devices, below knee Bilateral lower extremities             -antiplatelet therapy: N/A 3. Pain Management: tylenol prn 4. Mood/Behavior/Sleep: LCSW to follow for evaluation and support.              -antipsychotic agents: N/A 5. Neuropsych/cognition: This patient is capable of making decisions on his own behalf. 6. Skin/Wound Care: Wet to dry dressing changes daily to midline.               --continue Zinc. Add vitamin C  7. Fluids/Electrolytes/Nutrition: Monitor I/O. Check CMET in am             - 8. Sigmoid perforation s/p resection w/abscess: Continue Duricef and Flagyl thru 12/26 9. Rectal ulcers:  Continue short chain fatty acid edemas daily, changed timing to 4pm as per patient's preferences. Please find out from GI how long these should continue  12/28- con't Flagyl per 4 weeks pending course of remicade.   12/29- spoke with pharmacy- will try to arrange remicade for next Wednesday- will let therapy know since will take 4 hours  12/30- Pharmacy says doesn't need infusion monitoring 2nd time like did first time.  10.  Crohn's disease: Remicade given 12/20 and repeat dose in 2 weeks per GI             --continue prednisone 40 mg X 2 weeks followed by 5 mg/week taper. 12/29- will start taper as of 03/21/22   11. T2DM: Hgb A1C-6.1 and BS poorly controlled due to steroids/immobility.             --will monitor BS ac/hs and titrate insulin as indicated.             --continue Levemir 14 units with  6 units novolog for meal coverage.   -12/26 Increase Levemir to 16u  12/28- will increase levemir to 18 units since still running 150s to 300s- can increase Meal coverage tomorrow.   12/29- just got levemir increase last night- this AM 175- so doing a little better- will wait to increase insulin for now  12/30- Increase novolog 7 units TID  12/31- Cbgs down to <300 at least- 154 this AM- give another day before changing  1/1- Cbgs 128 this AM- up to 290 with meals- will increase meal insulin to 9 units and monitor- will make changes once start to wean prednisone  CBG (last 3)  Recent Labs    03/17/22 1622 03/17/22 2121 03/18/22 0612  GLUCAP 290* 246* 128*    12. Fluid overload/Pleural effusions: Daily wts with monitoring for overload             --intake improved and may need to add Low salt restrictions. Monitor daily weights   Filed Weights   03/16/22 0444 03/17/22 0500  03/18/22 0339  Weight: 93.7 kg 95.3 kg 98.5 kg     13. Hypomagnesemia:  -Mg 1.3-->1.5 and till low. Will add oral supplement.             12/23: Mag is 1.4, ordered 2grams IV supplement             12/24: repeat magnesium ordered today  12/26 MG is 1.5, called pharmacy-recommended 4g x1 and recheck in AM, appreciate assistance  2grams IV mag ordered 12/27.   12/28- will recheck in AM and determine what what else is needed for Mg.   12/29- will give 4 G IV since Mg down to 1.4 this AM- will recheck in AM  12/30- Mg 1.5- only slightly up with 4 G IV- pt agreed to start PO Mg 400 mg BID started  12/31- will recheck in AM- Mg 1.6 today-   1/1- Mg 1.5- will replete again with 4G Mg- likely losing some in colostomy- con't PO Mg as well 14. Severe OSA: Discussed need for CPAP/oxygen does not treat underling issues             --declines CPAP "too much at this time/can't lie on the left" 15. Tachycardic: EKG ordered and shows sinus tachycardia. May be 2/2 steroids, check magnesium level today. Discussed magnesium can help with tachycardia  -HR around 100, improved   1/1- HR running 90s-low 100s- con't regimen  I spent a total of  39  minutes on total care today- >50% coordination of care- due to d/w pt/wife about handicapped placard- filled one out for 6 months; also ok'd shower   LOS: 10 days A FACE TO FACE EVALUATION WAS PERFORMED  Charm Stenner 03/18/2022, 8:29 AM

## 2022-03-19 LAB — BASIC METABOLIC PANEL
Anion gap: 10 (ref 5–15)
BUN: 15 mg/dL (ref 8–23)
CO2: 29 mmol/L (ref 22–32)
Calcium: 8.8 mg/dL — ABNORMAL LOW (ref 8.9–10.3)
Chloride: 97 mmol/L — ABNORMAL LOW (ref 98–111)
Creatinine, Ser: 0.35 mg/dL — ABNORMAL LOW (ref 0.61–1.24)
GFR, Estimated: >60 mL/min (ref >60)
Glucose, Bld: 173 mg/dL — ABNORMAL HIGH (ref 70–99)
Potassium: 3.8 mmol/L (ref 3.5–5.1)
Sodium: 136 mmol/L (ref 135–145)

## 2022-03-19 LAB — ACID FAST CULTURE WITH REFLEXED SENSITIVITIES (MYCOBACTERIA)
Acid Fast Culture: POSITIVE — AB
Source of Sample: UNDETERMINED

## 2022-03-19 LAB — AFB ORGANISM ID BY DNA PROBE
M avium complex: POSITIVE — AB
M tuberculosis complex: NEGATIVE

## 2022-03-19 LAB — CBC
HCT: 31.3 % — ABNORMAL LOW (ref 39.0–52.0)
Hemoglobin: 10.1 g/dL — ABNORMAL LOW (ref 13.0–17.0)
MCH: 33 pg (ref 26.0–34.0)
MCHC: 32.3 g/dL (ref 30.0–36.0)
MCV: 102.3 fL — ABNORMAL HIGH (ref 80.0–100.0)
Platelets: 158 10*3/uL (ref 150–400)
RBC: 3.06 MIL/uL — ABNORMAL LOW (ref 4.22–5.81)
RDW: 18.1 % — ABNORMAL HIGH (ref 11.5–15.5)
WBC: 8.8 10*3/uL (ref 4.0–10.5)
nRBC: 0 % (ref 0.0–0.2)

## 2022-03-19 LAB — GLUCOSE, CAPILLARY
Glucose-Capillary: 158 mg/dL — ABNORMAL HIGH (ref 70–99)
Glucose-Capillary: 205 mg/dL — ABNORMAL HIGH (ref 70–99)
Glucose-Capillary: 305 mg/dL — ABNORMAL HIGH (ref 70–99)
Glucose-Capillary: 279 mg/dL — ABNORMAL HIGH (ref 70–99)

## 2022-03-19 LAB — MAGNESIUM: Magnesium: 1.5 mg/dL — ABNORMAL LOW (ref 1.7–2.4)

## 2022-03-19 MED ORDER — MAGNESIUM OXIDE -MG SUPPLEMENT 400 (240 MG) MG PO TABS
800.0000 mg | ORAL_TABLET | Freq: Two times a day (BID) | ORAL | Status: DC
  Administered 2022-03-19 – 2022-03-25 (×12): 800 mg via ORAL
  Filled 2022-03-19 (×12): qty 2

## 2022-03-19 MED ORDER — MAGNESIUM SULFATE 4 GM/100ML IV SOLN
4.0000 g | Freq: Once | INTRAVENOUS | Status: AC
  Administered 2022-03-19: 4 g via INTRAVENOUS
  Filled 2022-03-19: qty 100

## 2022-03-19 NOTE — Progress Notes (Signed)
MEDICATION RELATED CONSULT NOTE - INITIAL  -started 03/06/22  Pharmacy Consult for Remicade (Infliximab)  Indication: Crohn's disease  Allergies  Allergen Reactions   Lobster [Shellfish Allergy] Nausea Only   Poison Ivy Extract Itching    Patient Measurements: Weight: 98.5 kg (217 lb 2.5 oz)   Vital Signs: Temp: 98.7 F (37.1 C) (01/02 0519) Temp Source: Oral (01/02 0519) BP: 126/77 (01/02 0519) Pulse Rate: 87 (01/02 0519) Intake/Output from previous day: 01/01 0701 - 01/02 0700 In: 1450 [P.O.:1440; I.V.:10] Out: 3100 [Urine:2800; Stool:300] Intake/Output from this shift: Total I/O In: 240 [P.O.:240] Out: 300 [Stool:300]  Labs: Recent Labs    03/17/22 0420 03/18/22 0347 03/19/22 0452  WBC  --   --  8.8  HGB  --   --  10.1*  HCT  --   --  31.3*  PLT  --   --  158  CREATININE  --   --  0.35*  MG 1.6* 1.5* 1.5*   Estimated Creatinine Clearance: 106.5 mL/min (A) (by C-G formula based on SCr of 0.35 mg/dL (L)).   Medical History: Past Medical History:  Diagnosis Date   Adult ADHD    Allergic rhinitis    Anxiety    claustrophobic   Bronchopneumonia 11/03/2013   Colon polyps 2012 and 2013   All hyperplastic except one adenomatous w/out high grade dysplasia (recall 2023 per Dr. Deatra Ina)   COVID-19 virus infection 09/29/2020   Elevated transaminase level 2017   Viral hep screening neg.  Suspect fatty liver.   Hyperlipidemia    Hypertension    Inflammatory bowel disease    01/2022   Large bowel perforation ([place]) 02/2022   delayed, s/p colonoscopy w/biopsies (new dx crohn's/inflammatory BD)   Obesity    OSA on CPAP    Severe.  CPAP 10 cm H2O.  Follows Dr. Ander Slade.   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 [name] to do surg   Pneumonia    Seborrheic dermatitis of scalp    and face: hydrocort 2% cream bid prn to face, and fluocinonide 0.5% sol'n to scalp bid prn per Dr. Allyson Sabal (04/2014)   Secondary male hypogonadism 04/2021   Type 2 diabetes  mellitus with microalbuminuria ([place]) DM dx-09/27/2015. Microalb 2020    Medications:  Week 0 /dose #1 Remicade given 03/06/22  Assessment: 67 y.o male diagnosed with Chron's disease. Inpatient use of infliximab is medically necessary and consultation is completed per Gastroenterolgist, Dr Gerrit Heck.   Remicade (infliximab) dosing will be: 5 mg/kg IV over at least 2 hours at weeks 0 (given 12/20) ,week # 2 (due 03/20/22) , and 6 (due 04/17/22)  followed by maintenance therapy per gastroenterologist.  Week 0 /dose #1 Remicade given 03/06/22, patient tolerated dose well.  Week 2/dose #2 due on 03/20/2022  Bedside RNs administer Remicade infusions (IV nurse not required).  34M unit RN will need to monitor patient for any hypersensitivity reactions or any infusion issues. The administration instructions are on the [facility] as follows: For dose is 535m, thus rate Initiate infusion at 20 mL/hr for 15 min, then increase to 40 mL/hr for 15 min, then increase to 80 mL/hr for 15 min, then increase to 160 mL/hr for 15 min, then Increase to 300 mL/hr for 30 min, then Increase to 500 mL/hr for 30 min to complete infusion.     Premedication with H1/H2 antagonists, acetaminophen, and/or corticosteroids can be given if desired to prevent infusion reaction.    Plan:  Dose #2 Remicade 5 mg/kg IV  infusion due on 03/20/22. Infusion should take at least 2 hours.     Thank you for allowing pharmacy to be part of this patients care team. Nicole Cella, [place] Pharmacist  03/19/2022,1:05 PM

## 2022-03-19 NOTE — Progress Notes (Signed)
PROGRESS NOTE   Subjective/Complaints:  Pt reports no pain; no discomfort from Crohn's disease/colostomy.  Did w/c to car yesterday- will buy transfer board to get in/out of car- also got off floor yesterday with PT assistance- wanted ot learn.     ROS:   Pt denies SOB, abd pain, CP, N/V/C/D, and vision changes  Except for HPI  Objective:   No results found. Recent Labs    03/19/22 0452  WBC 8.8  HGB 10.1*  HCT 31.3*  PLT 158    Recent Labs    03/19/22 0452  NA 136  K 3.8  CL 97*  CO2 29  GLUCOSE 173*  BUN 15  CREATININE 0.35*  CALCIUM 8.8*     Intake/Output Summary (Last 24 hours) at 03/19/2022 0902 Last data filed at 03/19/2022 0818 Gross per 24 hour  Intake 1210 ml  Output 3100 ml  Net -1890 ml     Pressure Injury 03/08/22 Buttocks Left Deep Tissue Pressure Injury - Purple or maroon localized area of discolored intact skin or blood-filled blister due to damage of underlying soft tissue from pressure and/or shear. Present on admission to rehab (Active)  03/08/22   Location: Buttocks  Location Orientation: Left  Staging: Deep Tissue Pressure Injury - Purple or maroon localized area of discolored intact skin or blood-filled blister due to damage of underlying soft tissue from pressure and/or shear.  Wound Description (Comments): Present on admission to rehab  Present on Admission: Yes    Physical Exam: Vital Signs Blood pressure 126/77, pulse 87, temperature 98.7 F (37.1 C), temperature source Oral, resp. rate 17, weight 98.5 kg, SpO2 98 %.         General: awake, alert, appropriate, sitting up in bed; wife not at bedside; NAD HENT: conjugate gaze; oropharynx moist CV: regular rate; no JVD Pulmonary: CTA B/L; no W/R/R- good air movement GI: soft, NT, ND, (+)BS- (+) colostomy with very little loose stool in bag- stoma pink Psychiatric: appropriate- interactive Neurological: Ox3 Psych:  pleasant, normal affect Skin: abdominal dressing in place- however on inspection, has great granulation- ~ 2cm in width- spot ~ 4 cm in length that has some slough, but otherwise, looks great- ~ 2-77m in depth.  Neuro: 5/5 strength BUE, 4/5 BLE   Assessment/Plan: 1. Functional deficits which require 3+ hours per day of interdisciplinary therapy in a comprehensive inpatient rehab setting. Physiatrist is providing close team supervision and 24 hour management of active medical problems listed below. Physiatrist and rehab team continue to assess barriers to discharge/monitor patient progress toward functional and medical goals  Care Tool:  Bathing    Body parts bathed by patient: Right arm, Left arm, Chest, Right upper leg, Left upper leg, Face, Abdomen   Body parts bathed by helper: Right lower leg, Left lower leg, Front perineal area, Buttocks     Bathing assist Assist Level: Maximal Assistance - Patient 24 - 49%     Upper Body Dressing/Undressing Upper body dressing   What is the patient wearing?: Pull over shirt    Upper body assist Assist Level: Set up assist    Lower Body Dressing/Undressing Lower body dressing      What  is the patient wearing?: Pants, Incontinence brief     Lower body assist Assist for lower body dressing: Maximal Assistance - Patient 25 - 49%     Toileting Toileting    Toileting assist Assist for toileting: Maximal Assistance - Patient 25 - 49%     Transfers Chair/bed transfer  Transfers assist     Chair/bed transfer assist level: Minimal Assistance - Patient > 75%     Locomotion Ambulation   Ambulation assist   Ambulation activity did not occur: Safety/medical concerns  Assist level: Contact Guard/Touching assist Assistive device: Walker-rolling Max distance: 177 ft   Walk 10 feet activity   Assist  Walk 10 feet activity did not occur: Safety/medical concerns  Assist level: Contact Guard/Touching assist Assistive device:  Walker-rolling   Walk 50 feet activity   Assist Walk 50 feet with 2 turns activity did not occur: Safety/medical concerns  Assist level: Contact Guard/Touching assist Assistive device: Walker-rolling    Walk 150 feet activity   Assist Walk 150 feet activity did not occur: Safety/medical concerns  Assist level: Contact Guard/Touching assist Assistive device: Walker-rolling    Walk 10 feet on uneven surface  activity   Assist Walk 10 feet on uneven surfaces activity did not occur: Safety/medical concerns         Wheelchair     Assist Is the patient using a wheelchair?: Yes Type of Wheelchair: Manual    Wheelchair assist level: Supervision/Verbal cueing Max wheelchair distance: 177 ft    Wheelchair 50 feet with 2 turns activity    Assist        Assist Level: Supervision/Verbal cueing   Wheelchair 150 feet activity     Assist      Assist Level: Supervision/Verbal cueing   Blood pressure 126/77, pulse 87, temperature 98.7 F (37.1 C), temperature source Oral, resp. rate 17, weight 98.5 kg, SpO2 98 %.  Medical Problem List and Plan: 1. Functional deficits secondary to debility             -patient may shower             -ELOS/Goals: 10-14 days S         will also ask therapy to stop by 3:45 as well as inform them about Remicade next Wednesday for 4 hours.   1/2- CAN HAVE SHOWER- cover the abd wound  -con't CIR- PT and OT- team conference today to determine progress- d/c date pt thinks 1/10- but 1/11 on board- will address 2.  Antithrombotics: -DVT/anticoagulation:  Mechanical: Sequential compression devices, below knee Bilateral lower extremities             -antiplatelet therapy: N/A 3. Pain Management: tylenol prn 4. Mood/Behavior/Sleep: LCSW to follow for evaluation and support.              -antipsychotic agents: N/A 5. Neuropsych/cognition: This patient is capable of making decisions on his own behalf. 6. Skin/Wound Care: Wet to dry  dressing changes daily to midline.              --continue Zinc. Add vitamin C   1/2- abd wound healing grea-t will assess L buttock DTI in AM 7. Fluids/Electrolytes/Nutrition: Monitor I/O. Check CMET in am             - 8. Sigmoid perforation s/p resection w/abscess: Continue Duricef and Flagyl thru 12/26 9. Rectal ulcers:  Continue short chain fatty acid edemas daily, changed timing to 4pm as per patient's preferences. Please find out from GI  how long these should continue  12/28- con't Flagyl per 4 weeks pending course of remicade.   12/29- spoke with pharmacy- will try to arrange remicade for next Wednesday- will let therapy know since will take 4 hours  12/30- Pharmacy says doesn't need infusion monitoring 2nd time like did first time.  10. Crohn's disease: Remicade given 12/20 and repeat dose in 2 weeks per GI             --continue prednisone 40 mg X 2 weeks followed by 5 mg/week taper. 12/29- will start steroid taper as of 03/21/22  per GI  11. T2DM: Hgb A1C-6.1 and BS poorly controlled due to steroids/immobility.             --will monitor BS ac/hs and titrate insulin as indicated.             --continue Levemir 14 units with  6 units novolog for meal coverage.   -12/26 Increase Levemir to 16u  12/28- will increase levemir to 18 units since still running 150s to 300s- can increase Meal coverage tomorrow.   12/29- just got levemir increase last night- this AM 175- so doing a little better- will wait to increase insulin for now  12/30- Increase novolog 7 units TID  12/31- Cbgs down to <300 at least- 154 this AM- give another day before changing  1/1- Cbgs 128 this AM- up to 290 with meals- will increase meal insulin to 9 units and monitor- will make changes once start to wean prednisone  1/2- just increased meal insulin yesterday- but CBGs higher- 158 this AM, but up to high 200s/mid 300s in last 24 hours- will likely increase again tomorrow.   CBG (last 3)  Recent Labs     03/18/22 1615 03/18/22 2050 03/19/22 0555  GLUCAP 320* 317* 158*    12. Fluid overload/Pleural effusions: Daily wts with monitoring for overload             --intake improved and may need to add Low salt restrictions. Monitor daily weights  1/2- abruptly increased 3 kg between 12/31 to 1/1- will monitor- since otherwise, doesn't appear fluid overloaded-will see if can do standing weights.  Filed Weights   03/17/22 0500 03/18/22 0339 03/19/22 0519  Weight: 95.3 kg 98.5 kg 98.5 kg     13. Hypomagnesemia:  -Mg 1.3-->1.5 and till low. Will add oral supplement.             12/23: Mag is 1.4, ordered 2grams IV supplement             12/24: repeat magnesium ordered today  12/26 MG is 1.5, called pharmacy-recommended 4g x1 and recheck in AM, appreciate assistance  2grams IV mag ordered 12/27.   12/28- will recheck in AM and determine what what else is needed for Mg.   12/29- will give 4 G IV since Mg down to 1.4 this AM- will recheck in AM  12/30- Mg 1.5- only slightly up with 4 G IV- pt agreed to start PO Mg 400 mg BID started  12/31- will recheck in AM- Mg 1.6 today-   1/1- Mg 1.5- will replete again with 4G Mg- likely losing some in colostomy- con't PO Mg as well  1/2- Mg 1.5 again- will re-plete Mg again and increase Po MgOx to 800 mg BID- recheck in AM 14. Severe OSA: Discussed need for CPAP/oxygen does not treat underling issues             --declines CPAP "too  much at this time/can't lie on the left" 15. Tachycardic: EKG ordered and shows sinus tachycardia. May be 2/2 steroids, check magnesium level today. Discussed magnesium can help with tachycardia  -HR around 100, improved   1/1- HR running 90s-low 100s- con't regimen   I spent a total of 39   minutes on total care today- >50% coordination of care- due to team conference to f/u on progress and make sure of d/c date- also assessing abd wound.    LOS: 11 days A FACE TO FACE EVALUATION WAS PERFORMED  Teaira Croft 03/19/2022,  9:02 AM

## 2022-03-19 NOTE — Progress Notes (Signed)
Physical Therapy Session Note  Patient Details  Name: Richard [name] MRN: [id number] Date of Birth: 1955/04/25  Today's Date: 03/19/2022 PT Individual Time: 1500-1530 PT Individual Time Calculation (min): 30 min   Short Term Goals: Week 2:  PT Short Term Goal 1 (Week 2): STG=LTG due ELOS  Skilled Therapeutic Interventions/Progress Updates: Pt presented in w/c with wife present agreeable to therapy. Pt c/o mild pain at incision area, rest and repositioning provided during session. Session focused general strengthening for emphasis on glutes. Pt transported to rehab gym for time management and set up in parallel bars. Pt required minA for all stands in parallel with education provided on increasing anterior weight shifting to facilitate. Pt participated in mini-squats with PTA providing tactile cues for improved technique and improved glute recruitment. Pt also performed standing hip extension and hip abd/add with yellow theraband x 10 bilaterally for each. Pt requesting defer of any additional activity due to fatigue however was able to answer queries regarding length of transfer board (recommending 31in) due to car height. Pt transported back to room and performed lateral scoot transfer back to bed. Pt left seated EOB with wife present assisting in preparation for dressing/ostomy change.      Therapy Documentation Precautions:  Precautions Precautions: None Precaution Comments: L colostomy, monitor O2/HR Restrictions Weight Bearing Restrictions: No General:   Vital Signs:   Pain:   Mobility:   Locomotion :    Trunk/Postural Assessment :    Balance:   Exercises:   Other Treatments:      Therapy/Group: Individual Therapy  Joannah Gitlin 03/19/2022, 3:47 PM

## 2022-03-19 NOTE — Progress Notes (Signed)
Patient ID: Richard [name], male   DOB: 1956-02-02, 67 y.o.   MRN: [id number]  Met with pt and wife who is present in his room to update both regarding team conference progress toward his goals of min-CGA level and discharge still 1/10. Both are feeling better regarding his progress and can see a big difference from last week to this week. He has ordered bed rails and a transfer board and will also get a rolling walker if can not find the one they gave to his Mom. Main issue is the 6 inch steps he needs to get up and down and this will be the main focus from now until discharge date. Will continue to work on discharge needs.

## 2022-03-19 NOTE — Patient Care Conference (Signed)
Inpatient RehabilitationTeam Conference and Plan of Care Update Date: 03/19/2022   Time: 11:29 AM   Patient Name: Richard [name]      Medical Record Number: [id number]  Date of Birth: 11-15-1955 Sex: Male         Room/Bed: 4M07C/4M07C-01 Payor Info: Payor: [employer] / Plan: Electrical engineer / Product Type: *No Product type* /    Admit Date/Time:  03/08/2022  4:02 PM  Primary Diagnosis:  [facility] Problems: Principal Problem:   Debility Active Problems:   Crohn's disease of large intestine with other complication ([place])   Depressive reaction    Expected Discharge Date: Expected Discharge Date: 03/27/22  Team Members Present: Physician leading conference: Dr. Courtney Heys Social Worker Present: Ovidio Kin, LCSW Nurse Present: Tacy Learn, RN PT Present: Verl Dicker, PT OT Present: Jamey Ripa, OT PPS Coordinator present : Gunnar Fusi, SLP     Current Status/Progress Goal Weekly Team Focus  Bowel/Bladder   Colostomy bag for bowel. Incontinent urine-purewick at HS   Regain continence and lead ostomy care   Assist with toileting q shift and PRN. Focus on education with patient and wife on ostomy care    Swallow/Nutrition/ Hydration               ADL's   Functional performance can flucuate from AM to PM depending on fatigue. Typically, pt is able to complete lateral scoot transfer with SBA. Min A for sit to stand with RW. Able to complete UB and LB bathing and dressing at bed level/EOB with Set-up.   min A/set up overall   Shower versus sponge bath, hands on colostomy management, activity tolerance, UB strength, pt/family education    Mobility   CGA/min bed mobility with rails, min/mod transfers, (S) w/c propulsion x 150 ft, CGA gait with RW 177 ft, steps x 4 CGA (3 inches) with 2 HR's   CGA transfers and  (S) gait  transfers from w/c, stair training, posterior chain activiation, activity tolerance, pt/fam education    Communication                 Safety/Cognition/ Behavioral Observations               Pain   denies pain   0/10   Assess pain q shift and PRN    Skin   Midline incision wet to dry, Stage 2 lt buttocks and fissure sacrum. Foam on place   No new skin breakdown. Maintain skin integrity  Continue wound care education and pressure injury prevention/treatment      Discharge Planning:  Making good progress in therapies fatigues easily but does push himself and wife is here daily and observing. Pt still would like to discharge sooner but unsur eif will be ready   Team Discussion: Debility. Ostomy/continent of urine. Okay to use condom cath at [facility]. Denies pain. Midline incision is healing, DTI to buttocks. Increasing insulin. Mag level 1.5 with orders to replete. Standing weights needed. Remicade to be administered 01/03 from 12-4. Therapies can fluctuate depending on time of day.    Patient on target to meet rehab goals: yes, W/C propulsion 150 ft. Gait CGA 177 ft with RW. Stairs x 4  *See Care Plan and progress notes for long and short-term goals.   Revisions to Treatment Plan:  Medication adjustments, standing weights, condom cath at [facility]  Teaching Needs: On-going ostomy training, medication management, safety, skin/wound care, gait/transfer training, etc  Current Barriers to Discharge: Decreased caregiver support, Home enviroment  access/layout, and Wound care  Possible Resolutions to Barriers: Family education, nursing education, order recommended DME     Medical Summary Current Status: wife does ostomy care- learning well- conitnent of urine- DTI buttocks- abd wound- granulating well- Remicaide infusion tomorrow- purewick at night-  Barriers to Discharge: Behavior/Mood;Complicated Wound;Electrolyte abnormality;Incontinence;Uncontrolled Diabetes;Other (comments);Pending chemo/radiation;Medical stability;Self-care education  Barriers to Discharge Comments: uncontrolled Crohn's disease- - colostomy wife doing  training-  min-mod A transfers- used transfer board- CGA 177 ft RW_ did steps- 4- 3inches- 2 handrails. can shower now Possible Resolutions to [employer] Focus: switch to  condom cath long term- is continent- since won't stop- urinal during day- d/c 1/10- get w/c- no elevating leg rests-   Continued Need for Acute Rehabilitation Level of Care: The patient requires daily medical management by a physician with specialized training in physical medicine and rehabilitation for the following reasons: Direction of a multidisciplinary physical rehabilitation program to maximize functional independence : Yes Medical management of patient stability for increased activity during participation in an intensive rehabilitation regime.: Yes Analysis of laboratory values and/or radiology reports with any subsequent need for medication adjustment and/or medical intervention. : Yes   I attest that I was present, lead the team conference, and concur with the assessment and plan of the team.   Ernest Pine 03/19/2022, 3:32 PM

## 2022-03-19 NOTE — Progress Notes (Signed)
Occupational Therapy Session Note  Patient Details  Name: Richard [name] MRN: [id number] Date of Birth: 27-Feb-1956  Today's Date: 03/19/2022 OT Individual Time: 800-900 1st Session; 1330-1415 2nd Session  OT Individual Time Calculation (min): 60 min, 45 min    Short Term Goals: Week 2:  OT Short Term Goal 1 (Week 2): STGs = LTGs  Skilled Therapeutic Interventions/Progress Updates:   1st Session:  Pt seen for skilled OT session with focus on AM self care re-training and upgrades for increased independence. Pt eager for hair washing but still reluctant for showering and OT will clarify with MD and nursing at care conference later am to reinforce protocol for abdominal wound to ease pt's fear. Pt able to complete bed and EOB LB self care with set up. Transferred laterally bed to w/c with close S. Pt self propelled to and from bathroom with S for hair tray hair washing with min A. Sink side UB self care and grooming with set up only. Stood with min A for LB garment mngt with RW for 30 sec max. Pt reported no pain in R posterior shoulder following yesterdays OT session at rest. Reaching R UE with 4/10 pain. OT used massage wand to region for gentle pain mngt and vibration with + results. Pt left in care of nursing for meds with needs, call button and safety measures in place.    2nd Session:   Pt's wife bedside upon OT arrival. Initiated family educ for UE HEP as well as standing tolerance and balance tasks in therapy gym.  Planning shower for Friday with wife present for completion of Family educ on shower skills with pt agreeable and coordination with ostomy management, wound care with nursing and progression of bathing skills. Pt able to self propel to main gym ~ 50 ft then OT instruction with leg rest mngt with min cues. Pt stood with RW x 2 sets of theraputty B UE HEP progression. Required min A for sit to stand 1st trial then CGA 2nd trial. Pt able to complete med resistance grasping, rolling,  pinching x 3 sets Bly with no c/o pain B Ue's Pt then upgraded to red (medium) theraband for tricep press 10 reps Bly. OT returned pt to room via w/c due to time constraints and left pt up in w/c with all safety measures and needs in reach.    Therapy Documentation Precautions:  Precautions Precautions: None Precaution Comments: L colostomy, monitor O2/HR Restrictions Weight Bearing Restrictions: No   Therapy/Group: Individual Therapy  Barnabas Lister 03/19/2022, 7:47 AM

## 2022-03-19 NOTE — Progress Notes (Signed)
Physical Therapy Session Note  Patient Details  Name: Richard [name] MRN: [id number] Date of Birth: [birth year]  Today's Date: 03/19/2022 PT Individual Time: 1015-1127 PT Individual Time Calculation (min): 72 min   Short Term Goals: Week 2:  PT Short Term Goal 1 (Week 2): STG=LTG due ELOS  Skilled Therapeutic Interventions/Progress Updates:      Therapy Documentation Precautions:  Precautions Precautions: None Precaution Comments: L colostomy, monitor O2/HR Restrictions Weight Bearing Restrictions: No   Pt agreeable to PT session with emphasis on car transfers, stair training and UE/LE strengthening. Pt without verbal complaints of pain in session. Pt transported for energy conservation to ortho gym to practice car transfers. Pt viewed various width slide boards and decided to order 30 inch transfer board. Pt performed slideboard transfer from w/c to car at height of 28 inches. Pt propelled w/c (S) for safety to main gym and requires min A for STS with RW and pt navigated 4 steps (3 inches) with 2 HR's and CGA without weightbearing through bilateral forearms. Pt transported to dayroom and performed kinetron x 8 minutes at 20 cm/s for LE strengthening. Pt transitioned to w/c push up's 2 x 5 and demonstrates increased gluteal clearance with activity. Pt transported to room and pt educated to perform self-active assist gastroc flexibility exercises daily. Pt left seated in w/c at bedside with all needs in reach.     Therapy/Group: Individual Therapy  Verl Dicker Verl Dicker PT, DPT  03/19/2022, 7:51 AM

## 2022-03-20 LAB — GLUCOSE, CAPILLARY
Glucose-Capillary: 154 mg/dL — ABNORMAL HIGH (ref 70–99)
Glucose-Capillary: 178 mg/dL — ABNORMAL HIGH (ref 70–99)
Glucose-Capillary: 271 mg/dL — ABNORMAL HIGH (ref 70–99)
Glucose-Capillary: 296 mg/dL — ABNORMAL HIGH (ref 70–99)

## 2022-03-20 LAB — MAGNESIUM: Magnesium: 1.7 mg/dL (ref 1.7–2.4)

## 2022-03-20 MED ORDER — MAGNESIUM SULFATE 2 GM/50ML IV SOLN
2.0000 g | Freq: Once | INTRAVENOUS | Status: AC
  Administered 2022-03-20: 2 g via INTRAVENOUS
  Filled 2022-03-20: qty 50

## 2022-03-20 MED ORDER — INSULIN DETEMIR 100 UNIT/ML ~~LOC~~ SOLN
20.0000 [IU] | Freq: Every day | SUBCUTANEOUS | Status: DC
  Administered 2022-03-20 – 2022-03-24 (×4): 20 [IU] via SUBCUTANEOUS
  Filled 2022-03-20 (×6): qty 0.2

## 2022-03-20 NOTE — Consult Note (Addendum)
[place] Nurse wound follow up; weekly follow-up for deep tissue injury noted 03/08/2022 Wound type: remaining non-intact perirectal wound Stage 2, surrounding deep tissue injury has resolved  Measurement: 0.2 cms x 0.3 cms x 0.1 cm  Wound bed: pink and moist  Drainage (amount, consistency, odor) scant  Periwound: intact  Dressing procedure/placement/frequency: leave area open to air, apply moisture barrier cream twice daily and prn soilage   Discussed with bedside nurse.  Re consult if needed, will not follow at this time.    [place] Nurse ostomy follow up Patient deferred any additional ostomy education today.  Large amount of brown stool noted in ostomy, patient deferred bag being emptied at this time as he is going for therapy.     WOC will continue to follow patient for ostomy care and education.    Thanks,  [employer] MSN, RN-BC, [employer]

## 2022-03-20 NOTE — Progress Notes (Signed)
Occupational Therapy Session Note  Patient Details  Name: Richard [name] MRN: [id number] Date of Birth: 06-13-1955  Today's Date: 03/20/2022 OT Individual Time: 0800-0900 1st Session; 9622-2979 2nd Session  OT Individual Time Calculation (min): 60 min, 70 min    Short Term Goals: Week 2:  OT Short Term Goal 1 (Week 2): STGs = LTGs  Skilled Therapeutic Interventions/Progress Updates:  1st Session:  Pt seen for 1st am skilled OT session with nursing already assisting pt up to w/c and sink side for am care. Pt ready for therapy out of room to address activity tolerance and overall strengthening. PT and OT coord due to all therapy this am due to need for infusion in pm. Pt self transported to and from ortho gym with S w/c level 65 ft x 2. Brief rest prior to 10 reps forward, lateral and circular R/L x 3 sets of core strength with seated theraball. Pt then performed SCIFit 3 sets of 4 min with rests between intervals. HR max 118 bpm. Pt completed session with 4 lb bicep curls Bly with 10 reps x 3 sets. Self propelled back to room and left with all safety measures, needs and nurse call button in reach.   2nd Session:  Pt seen for 2nd OT session this am. Pt able to demonstrate use of urinal x 2 trials with increased time but indep in sitting. OT encouraged this for carryover between sessions. Pt to report volumes to nursing if monitoring i/o. Pt able to demonstrate TTB transfer in stall shower via lateral transfer with grab bar and CGA in prep for shower training with wife on Fri. Pt self propelled to and from ortho gym 65 ft x 2 and transferred laterally on and off mat with close S. EOM to and from supine with min A due to no rail support. Completed 2 lb bar in supine with B Ue's for scap, sh, core mm 10 reps x 3 sets. Rests between intervals. Once back in room, OT assisted pt to stand on scale for standing weight ordered with on and off x 2 with RW support and CGA with NT standing by. Left pt in care of  NT for vitals with call button and needs in reach.     Therapy Documentation Precautions:  Precautions Precautions: None Precaution Comments: L colostomy, monitor O2/HR Restrictions Weight Bearing Restrictions: No   Therapy/Group: Individual Therapy  Barnabas Lister 03/20/2022, 7:52 AM

## 2022-03-20 NOTE — Plan of Care (Signed)
Wound Plan   Braden Score: 17  Sensory: 3  Moisture: 4  Activity: 3  Mobility: 3  Nutrition: 3  Friction: 1   Wounds present:   Pressure Injury 03/08/22 Buttocks Left DTI (POA)  Wound Mid Coccyx Fissure   Interventions:   Request pictures  Vitamin C Hydrocortisone Cream Atarax Zinc sulfate

## 2022-03-20 NOTE — Progress Notes (Signed)
Physical Therapy Session Note  Patient Details  Name: BLAYN WHETSELL MRN: [id number] Date of Birth: [birth year]  Today's Date: 03/20/2022 PT Individual Time: 0915-1013 PT Individual Time Calculation (min): 58 min   Short Term Goals: Week 2:  PT Short Term Goal 1 (Week 2): STG=LTG due ELOS  Skilled Therapeutic Interventions/Progress Updates:      Therapy Documentation Precautions:  Precautions Precautions: None Precaution Comments: L colostomy, monitor O2/HR Restrictions Weight Bearing Restrictions: No  Pt agreeable to PT session with emphasis on stair and car transfer training. [place] nurse present for session and pt required min A with STS from w/c with RW for sacral wound inspection. Pt transported total A for energy conservation to main gym. Pt navigated steps 2 x 4 ( 3 inches) with 2 HR's CGA in one bout and required extended seated rest break. Pt navigated steps 1 x 4 (6 inches) with 2 HR's CGA. Pt encouraged following stair navigation as he feels comfortable being able to enter and exit his residence upon discharge. Pt transported to ortho gym and pt performed slide board transfer to car height of 26.5 inches with (S) for board placement and transfer. Pt returned to room and left in care of nursing in preparation for ostomy management. Pt without verbal complaints of pain in session.    Therapy/Group: Individual Therapy  Verl Dicker Verl Dicker PT, DPT  03/20/2022, 7:48 AM

## 2022-03-20 NOTE — Progress Notes (Signed)
Patient refuse CHG bath at this time, states he want to eat first.

## 2022-03-20 NOTE — Progress Notes (Addendum)
Patient has an order for CPAP but refuse it,informed writer he does not need it. Patient also refused SCD's. Patient states he was educated and has not worn SCD's since arrival to [facility].

## 2022-03-20 NOTE — Progress Notes (Signed)
PROGRESS NOTE   Subjective/Complaints:  Pt reports colostomy full- NT there to empty it- ate 100% tray- remicade scheduled for today- will d/w charge nurse-  Prednisone dose reduction already written for-     ROS:   Pt denies SOB, abd pain, CP, N/V/C/D, and vision changes  Except for HPI  Objective:   No results found. Recent Labs    03/19/22 0452  WBC 8.8  HGB 10.1*  HCT 31.3*  PLT 158    Recent Labs    03/19/22 0452  NA 136  K 3.8  CL 97*  CO2 29  GLUCOSE 173*  BUN 15  CREATININE 0.35*  CALCIUM 8.8*     Intake/Output Summary (Last 24 hours) at 03/20/2022 0836 Last data filed at 03/20/2022 3383 Gross per 24 hour  Intake 720 ml  Output 2800 ml  Net -2080 ml     Pressure Injury 03/08/22 Buttocks Left Deep Tissue Pressure Injury - Purple or maroon localized area of discolored intact skin or blood-filled blister due to damage of underlying soft tissue from pressure and/or shear. Present on admission to rehab (Active)  03/08/22   Location: Buttocks  Location Orientation: Left  Staging: Deep Tissue Pressure Injury - Purple or maroon localized area of discolored intact skin or blood-filled blister due to damage of underlying soft tissue from pressure and/or shear.  Wound Description (Comments): Present on admission to rehab  Present on Admission: Yes    Physical Exam: Vital Signs Blood pressure 109/85, pulse 99, temperature 98.9 F (37.2 C), temperature source Oral, resp. rate 19, height '5\' 9"'$  (1.753 m), weight 98.5 kg, SpO2 97 %.         General: awake, alert, appropriate, sitting up in bed; NT in room; NAD HENT: conjugate gaze; oropharynx moist CV: regular rate; no JVD Pulmonary: CTA B/L; no W/R/R- good air movement GI: soft, NT, ND, (+)BS- colostomy full of soft stool- a little more formed than last 2 days Psychiatric: appropriate Neurological: Ox3  Psych: pleasant, normal affect Skin:  abdominal dressing in place- however on inspection, has great granulation- ~ 2cm in width- spot ~ 4 cm in length that has some slough, but otherwise, looks great- ~ 2-51m in depth.  Neuro: 5/5 strength BUE, 4/5 BLE   Assessment/Plan: 1. Functional deficits which require 3+ hours per day of interdisciplinary therapy in a comprehensive inpatient rehab setting. Physiatrist is providing close team supervision and 24 hour management of active medical problems listed below. Physiatrist and rehab team continue to assess barriers to discharge/monitor patient progress toward functional and medical goals  Care Tool:  Bathing    Body parts bathed by patient: Right arm, Left arm, Chest, Right upper leg, Left upper leg, Face, Abdomen, Buttocks, Right lower leg, Left lower leg   Body parts bathed by helper: Right lower leg, Left lower leg, Front perineal area, Buttocks     Bathing assist Assist Level: Contact Guard/Touching assist     Upper Body Dressing/Undressing Upper body dressing   What is the patient wearing?: Pull over shirt    Upper body assist Assist Level: Set up assist    Lower Body Dressing/Undressing Lower body dressing  What is the patient wearing?: Pants, Incontinence brief     Lower body assist Assist for lower body dressing: Minimal Assistance - Patient > 75%     Toileting Toileting    Toileting assist Assist for toileting: Moderate Assistance - Patient 50 - 74%     Transfers Chair/bed transfer  Transfers assist     Chair/bed transfer assist level: Minimal Assistance - Patient > 75%     Locomotion Ambulation   Ambulation assist   Ambulation activity did not occur: Safety/medical concerns  Assist level: Contact Guard/Touching assist Assistive device: Walker-rolling Max distance: 177 ft   Walk 10 feet activity   Assist  Walk 10 feet activity did not occur: Safety/medical concerns  Assist level: Contact Guard/Touching assist Assistive device:  Walker-rolling   Walk 50 feet activity   Assist Walk 50 feet with 2 turns activity did not occur: Safety/medical concerns  Assist level: Contact Guard/Touching assist Assistive device: Walker-rolling    Walk 150 feet activity   Assist Walk 150 feet activity did not occur: Safety/medical concerns  Assist level: Contact Guard/Touching assist Assistive device: Walker-rolling    Walk 10 feet on uneven surface  activity   Assist Walk 10 feet on uneven surfaces activity did not occur: Safety/medical concerns         Wheelchair     Assist Is the patient using a wheelchair?: Yes Type of Wheelchair: Manual    Wheelchair assist level: Supervision/Verbal cueing Max wheelchair distance: 177 ft    Wheelchair 50 feet with 2 turns activity    Assist        Assist Level: Supervision/Verbal cueing   Wheelchair 150 feet activity     Assist      Assist Level: Supervision/Verbal cueing   Blood pressure 109/85, pulse 99, temperature 98.9 F (37.2 C), temperature source Oral, resp. rate 19, height '5\' 9"'$  (1.753 m), weight 98.5 kg, SpO2 97 %.  Medical Problem List and Plan: 1. Functional deficits secondary to debility             -patient may shower             -ELOS/Goals: 10-14 days S         will also ask therapy to stop by 3:45 as well as inform them about Remicade next Wednesday for 4 hours.   1/2- CAN HAVE SHOWER- cover the abd wound  -1/3- Con't CIR- PT and OT- d/c date 1/10- not 1/11  Remicade infusion today 2.  Antithrombotics: -DVT/anticoagulation:  Mechanical: Sequential compression devices, below knee Bilateral lower extremities             -antiplatelet therapy: N/A 3. Pain Management: tylenol prn 4. Mood/Behavior/Sleep: LCSW to follow for evaluation and support.              -antipsychotic agents: N/A 5. Neuropsych/cognition: This patient is capable of making decisions on his own behalf. 6. Skin/Wound Care: Wet to dry dressing changes daily to  midline.              --continue Zinc. Add vitamin C   1/2- abd wound healing grea-t will assess L buttock DTI in AM 7. Fluids/Electrolytes/Nutrition: Monitor I/O. Check CMET in am             - 8. Sigmoid perforation s/p resection w/abscess: Continue Duricef and Flagyl thru 12/26 9. Rectal ulcers:  Continue short chain fatty acid edemas daily, changed timing to 4pm as per patient's preferences. Please find out from GI how long  these should continue  12/28- con't Flagyl per 4 weeks pending course of remicade.   12/29- spoke with pharmacy- will try to arrange remicade for next Wednesday- will let therapy know since will take 4 hours  12/30- Pharmacy says doesn't need infusion monitoring 2nd time like did first time.   1/3- have infusion nurse coming for remicade infusion today at noon- double checked with nursing to verify 10. Crohn's disease: Remicade given 12/20 and repeat dose in 2 weeks per GI             --continue prednisone 40 mg X 2 weeks followed by 5 mg/week taper. 12/29- will start steroid taper as of 03/21/22  per GI  11. T2DM: Hgb A1C-6.1 and BS poorly controlled due to steroids/immobility.             --will monitor BS ac/hs and titrate insulin as indicated.             --continue Levemir 14 units with  6 units novolog for meal coverage.   -12/26 Increase Levemir to 16u  12/28- will increase levemir to 18 units since still running 150s to 300s- can increase Meal coverage tomorrow.   12/29- just got levemir increase last night- this AM 175- so doing a little better- will wait to increase insulin for now  12/30- Increase novolog 7 units TID  12/31- Cbgs down to <300 at least- 154 this AM- give another day before changing  1/1- Cbgs 128 this AM- up to 290 with meals- will increase meal insulin to 9 units and monitor- will make changes once start to wean prednisone  1/2- just increased meal insulin yesterday- but CBGs higher- 158 this AM, but up to high 200s/mid 300s in last 24 hours-  will likely increase again tomorrow.   1/3- increased levemir to 20 units at night- will reduce steroids as of 1/5 AM- so will improve over next few days, hopefully  CBG (last 3)  Recent Labs    03/19/22 1624 03/19/22 2058 03/20/22 0637  GLUCAP 305* 279* 154*    12. Fluid overload/Pleural effusions: Daily wts with monitoring for overload             --intake improved and may need to add Low salt restrictions. Monitor daily weights  1/2- abruptly increased 3 kg between 12/31 to 1/1- will monitor- since otherwise, doesn't appear fluid overloaded-will see if can do standing weights.   1/3- asked nursing to do standing weights- not done yet today Filed Weights   03/17/22 0500 03/18/22 0339 03/19/22 0519  Weight: 95.3 kg 98.5 kg 98.5 kg     13. Hypomagnesemia:  -Mg 1.3-->1.5 and till low. Will add oral supplement.             12/23: Mag is 1.4, ordered 2grams IV supplement             12/24: repeat magnesium ordered today  12/26 MG is 1.5, called pharmacy-recommended 4g x1 and recheck in AM, appreciate assistance  2grams IV mag ordered 12/27.   12/28- will recheck in AM and determine what what else is needed for Mg.   12/29- will give 4 G IV since Mg down to 1.4 this AM- will recheck in AM  12/30- Mg 1.5- only slightly up with 4 G IV- pt agreed to start PO Mg 400 mg BID started  12/31- will recheck in AM- Mg 1.6 today-   1/1- Mg 1.5- will replete again with 4G Mg- likely losing some in colostomy-  con't PO Mg as well  1/2- Mg 1.5 again- will re-plete Mg again and increase Po MgOx to 800 mg BID- recheck in AM  1/3- Mg up to 1.7! Will give 2G IV MG and con't PO Mg 14. Severe OSA: Discussed need for CPAP/oxygen does not treat underling issues             --declines CPAP "too much at this time/can't lie on the left" 15. Tachycardic: EKG ordered and shows sinus tachycardia. May be 2/2 steroids, check magnesium level today. Discussed magnesium can help with tachycardia  -HR around 100,  improved   1/1- HR running 90s-low 100s- con't regimen   I spent a total of  41  minutes on total care today- >50% coordination of care- due to calling nursing about remicade as well as d/w pt about Mg and steroids and remicade infusion   LOS: 12 days A FACE TO FACE EVALUATION WAS PERFORMED  Sunita Demond 03/20/2022, 8:36 AM

## 2022-03-21 LAB — GLUCOSE, CAPILLARY
Glucose-Capillary: 119 mg/dL — ABNORMAL HIGH (ref 70–99)
Glucose-Capillary: 98 mg/dL (ref 70–99)
Glucose-Capillary: 292 mg/dL — ABNORMAL HIGH (ref 70–99)
Glucose-Capillary: 269 mg/dL — ABNORMAL HIGH (ref 70–99)

## 2022-03-21 LAB — MAGNESIUM: Magnesium: 1.5 mg/dL — ABNORMAL LOW (ref 1.7–2.4)

## 2022-03-21 LAB — IRON AND TIBC
Iron: 30 ug/dL — ABNORMAL LOW (ref 45–182)
Saturation Ratios: 14 % — ABNORMAL LOW (ref 17.9–39.5)
TIBC: 213 ug/dL — ABNORMAL LOW (ref 250–450)
UIBC: 183 ug/dL

## 2022-03-21 LAB — FERRITIN: Ferritin: 119 ng/mL (ref 24–336)

## 2022-03-21 MED ORDER — MAGNESIUM SULFATE 4 GM/100ML IV SOLN
4.0000 g | Freq: Once | INTRAVENOUS | Status: AC
  Administered 2022-03-21: 4 g via INTRAVENOUS
  Filled 2022-03-21: qty 100

## 2022-03-21 NOTE — Progress Notes (Signed)
PROGRESS NOTE   Subjective/Complaints:  Pt reports tried condom catheter- failed- and wouldn't stay in place- fell off and got wet.  Went back to [employer]- doesn't think will need at home, but wans here at night.  Said filled up in 20 minutes after Remicade dose for 4 hours and then again o/n.  Remicade went well- no side effects.  Ate 100% tray this AM.   ROS:   Pt denies SOB, abd pain, CP, N/V/C/D, and vision changes  Except for HPI  Objective:   No results found. Recent Labs    03/19/22 0452  WBC 8.8  HGB 10.1*  HCT 31.3*  PLT 158    Recent Labs    03/19/22 0452  NA 136  K 3.8  CL 97*  CO2 29  GLUCOSE 173*  BUN 15  CREATININE 0.35*  CALCIUM 8.8*     Intake/Output Summary (Last 24 hours) at 03/21/2022 0076 Last data filed at 03/21/2022 0744 Gross per 24 hour  Intake 458 ml  Output 3050 ml  Net -2592 ml     Pressure Injury 03/20/22 Buttocks Left Stage 2 -  Partial thickness loss of dermis presenting as a shallow open injury with a red, pink wound bed without slough. 0.2 x 0.3 x 0.1 pink (Active)  03/20/22 1000  Location: Buttocks  Location Orientation: Left  Staging: Stage 2 -  Partial thickness loss of dermis presenting as a shallow open injury with a red, pink wound bed without slough.  Wound Description (Comments): 0.2 x 0.3 x 0.1 pink  Present on Admission: No    Physical Exam: Vital Signs Blood pressure 135/87, pulse (!) 101, temperature 98.7 F (37.1 C), temperature source Oral, resp. rate 16, height '5\' 9"'$  (1.753 m), weight 96.6 kg, SpO2 97 %.          General: awake, alert, appropriate, sitting u in bed; ate 100% tray; NAD HENT: conjugate gaze; oropharynx moist CV: mildly tachycardic rate- sounds in Regular rhythm; no JVD Pulmonary: CTA B/L; no W/R/R- good air movement GI: soft, NT, ND, (+)BS- colostomy in place- 1/2 full; Psychiatric: appropriate Neurological: Ox3 GU- has  purewick in place- container 1/2 full.  Psych: pleasant, normal affect Skin: abdominal dressing in place- however on inspection, has great granulation- ~ 2cm in width- spot ~ 4 cm in length that has some slough, but otherwise, looks great- ~ 2-35m in depth.  Neuro: 5/5 strength BUE, 4/5 BLE   Assessment/Plan: 1. Functional deficits which require 3+ hours per day of interdisciplinary therapy in a comprehensive inpatient rehab setting. Physiatrist is providing close team supervision and 24 hour management of active medical problems listed below. Physiatrist and rehab team continue to assess barriers to discharge/monitor patient progress toward functional and medical goals  Care Tool:  Bathing    Body parts bathed by patient: Right arm, Left arm, Chest, Right upper leg, Left upper leg, Face, Abdomen, Buttocks, Right lower leg, Left lower leg, Front perineal area   Body parts bathed by helper: Right lower leg, Left lower leg, Front perineal area, Buttocks     Bathing assist Assist Level: Contact Guard/Touching assist     Upper Body Dressing/Undressing Upper body dressing  What is the patient wearing?: Pull over shirt    Upper body assist Assist Level: Independent    Lower Body Dressing/Undressing Lower body dressing      What is the patient wearing?: Pants, Incontinence brief     Lower body assist Assist for lower body dressing: Minimal Assistance - Patient > 75%     Toileting Toileting    Toileting assist Assist for toileting: Moderate Assistance - Patient 50 - 74%     Transfers Chair/bed transfer  Transfers assist     Chair/bed transfer assist level: Minimal Assistance - Patient > 75%     Locomotion Ambulation   Ambulation assist   Ambulation activity did not occur: Safety/medical concerns  Assist level: Contact Guard/Touching assist Assistive device: Walker-rolling Max distance: 177 ft   Walk 10 feet activity   Assist  Walk 10 feet activity did  not occur: Safety/medical concerns  Assist level: Contact Guard/Touching assist Assistive device: Walker-rolling   Walk 50 feet activity   Assist Walk 50 feet with 2 turns activity did not occur: Safety/medical concerns  Assist level: Contact Guard/Touching assist Assistive device: Walker-rolling    Walk 150 feet activity   Assist Walk 150 feet activity did not occur: Safety/medical concerns  Assist level: Contact Guard/Touching assist Assistive device: Walker-rolling    Walk 10 feet on uneven surface  activity   Assist Walk 10 feet on uneven surfaces activity did not occur: Safety/medical concerns         Wheelchair     Assist Is the patient using a wheelchair?: Yes Type of Wheelchair: Manual    Wheelchair assist level: Supervision/Verbal cueing Max wheelchair distance: 177 ft    Wheelchair 50 feet with 2 turns activity    Assist        Assist Level: Supervision/Verbal cueing   Wheelchair 150 feet activity     Assist      Assist Level: Supervision/Verbal cueing   Blood pressure 135/87, pulse (!) 101, temperature 98.7 F (37.1 C), temperature source Oral, resp. rate 16, height '5\' 9"'$  (1.753 m), weight 96.6 kg, SpO2 97 %.  Medical Problem List and Plan: 1. Functional deficits secondary to debility             -patient may shower             -ELOS/Goals: 10-14 days S         will also ask therapy to stop by 3:45 as well as inform them about Remicade next Wednesday for 4 hours.   1/2- CAN HAVE SHOWER- cover the abd wound  1/4- d/c 1/10  Con't  CIR- PT and OT- s/p remicade- no side effects 2.  Antithrombotics: -DVT/anticoagulation:  Mechanical: Sequential compression devices, below knee Bilateral lower extremities             -antiplatelet therapy: N/A 3. Pain Management: tylenol prn 4. Mood/Behavior/Sleep: LCSW to follow for evaluation and support.              -antipsychotic agents: N/A 5. Neuropsych/cognition: This patient is capable  of making decisions on his own behalf. 6. Skin/Wound Care: Wet to dry dressing changes daily to midline.              --continue Zinc. Add vitamin C   1/2- abd wound healing grea-t will assess L buttock DTI in AM 7. Fluids/Electrolytes/Nutrition: Monitor I/O. Check CMET in am             - 8. Sigmoid perforation s/p resection w/abscess:  Continue Duricef and Flagyl thru 12/26 9. Rectal ulcers:  Continue short chain fatty acid edemas daily, changed timing to 4pm as per patient's preferences. Please find out from GI how long these should continue  12/28- con't Flagyl per 4 weeks pending course of remicade.   12/29- spoke with pharmacy- will try to arrange remicade for next Wednesday- will let therapy know since will take 4 hours  12/30- Pharmacy says doesn't need infusion monitoring 2nd time like did first time.   1/3- have infusion nurse coming for remicade infusion today at noon- double checked with nursing to verify  1/4- went well-  10. Crohn's disease: Remicade given 12/20 and repeat dose in 2 weeks per GI             --continue prednisone 40 mg X 2 weeks followed by 5 mg/week taper. 12/29- will start steroid taper as of 03/21/22  per GI  11. T2DM: Hgb A1C-6.1 and BS poorly controlled due to steroids/immobility.             --will monitor BS ac/hs and titrate insulin as indicated.             --continue Levemir 14 units with  6 units novolog for meal coverage.   -12/26 Increase Levemir to 16u  12/28- will increase levemir to 18 units since still running 150s to 300s- can increase Meal coverage tomorrow.   1/1- Cbgs 128 this AM- up to 290 with meals- will increase meal insulin to 9 units and monitor- will make changes once start to wean prednisone  1/3- increased levemir to 20 units at night- will reduce steroids as of 1/5 AM- so will improve over next few days, hopefully  1/4- Cbgs 119 to 296- so is doing somewhat better- con't regimen  CBG (last 3)  Recent Labs    03/20/22 1622  03/20/22 2058 03/21/22 0609  GLUCAP 271* 296* 119*    12. Fluid overload/Pleural effusions: Daily wts with monitoring for overload             --intake improved and may need to add Low salt restrictions. Monitor daily weights  1/2- abruptly increased 3 kg between 12/31 to 1/1- will monitor- since otherwise, doesn't appear fluid overloaded-will see if can do standing weights.   1/3- asked nursing to do standing weights- not done yet today  1/4- will ask nursing for standing weight- don't see in computer Filed Weights   03/18/22 0339 03/19/22 0519 03/20/22 1100  Weight: 98.5 kg 98.5 kg 96.6 kg     13. Hypomagnesemia:  -Mg 1.3-->1.5 and till low. Will add oral supplement.             12/23: Mag is 1.4, ordered 2grams IV supplement             12/24: repeat magnesium ordered today  12/26 MG is 1.5, called pharmacy-recommended 4g x1 and recheck in AM, appreciate assistance  2grams IV mag ordered 12/27.   12/28- will recheck in AM and determine what what else is needed for Mg.   12/29- will give 4 G IV since Mg down to 1.4 this AM- will recheck in AM  12/30- Mg 1.5- only slightly up with 4 G IV- pt agreed to start PO Mg 400 mg BID started  12/31- will recheck in AM- Mg 1.6 today-   1/1- Mg 1.5- will replete again with 4G Mg- likely losing some in colostomy- con't PO Mg as well  1/2- Mg 1.5 again- will re-plete Mg  again and increase Po MgOx to 800 mg BID- recheck in AM  1/3- Mg up to 1.7! Will give 2G IV MG and con't PO Mg  1/4- Mg 1.5 again this AM- will give 4G IV Mg again- likely due to colostomy?- will check daily for now  14. Severe OSA: Discussed need for CPAP/oxygen does not treat underling issues             --declines CPAP "too much at this time/can't lie on the left" 15. Tachycardic: EKG ordered and shows sinus tachycardia. May be 2/2 steroids, check magnesium level today. Discussed magnesium can help with tachycardia  -HR around 100, improved   1/1- HR running 90s-low 100s-  con't regimen  1/4- HR back in low 100's this AM- con't regimen  I spent a total of  36  minutes on total care today- >50% coordination of care- due to d/w nursing about pt's care- Remicade, CBGs, and Condom cath vs purewick     LOS: 13 days A FACE TO FACE EVALUATION WAS PERFORMED  Almeta Geisel 03/21/2022, 8:21 AM

## 2022-03-21 NOTE — Progress Notes (Signed)
Patient continue to refuse SCD's prophylaxis,, made aware of complications and states he is aware

## 2022-03-21 NOTE — Progress Notes (Signed)
Physical Therapy Session Note  Patient Details  Name: Richard [name] MRN: [id number] Date of Birth: Dec 09, 1955  Today's Date: 03/21/2022 PT Individual Time: 1015-1130, 1500-1534  PT Individual Time Calculation (min): 75 min , 34 min   Short Term Goals: Week 2:  PT Short Term Goal 1 (Week 2): STG=LTG due ELOS  Skilled Therapeutic Interventions/Progress Updates:      Therapy Documentation Precautions:  Precautions Precautions: None Precaution Comments: L colostomy, monitor O2/HR Restrictions Weight Bearing Restrictions: No  Treatment Session 1:  Pt agreeable to PT session and nurse present for ostomy management. Pt without verbal reports of pain in session. Pt transported total A for time management/energy conservation to main gym. Pt requires SBA with sit to stand with push off from arm rest instead of 1 UE placed on RW. Pt negotiated 4 steps with 2 HR's (6 inches with SBA.  Pt ambulated SBA 90 ft + 90 ft with intermittent seaeted rest break in standard chair. Pt demonstrates ability to perform STS from standard chair height SBA. Pt educated and participated in floor transfer for fall recovery. Pt transitioned from short sitting> tall kneeling> half kneeling>pull to sit in chair positioned posterior to pt CGA/min A. Pt then required min A for squat pivot transfer from chair/bench to mat table. Pt required extended sitting rest break and transitioned to gluteal strengthening exercises. Pt transitioned from tall to half kneeling on mat table with bench for UE support/balance 1 x 5. Pt then ambulated ~180 ft with RW SBA from main gym to hospital room. Pt left seated in w/c at bedside with all needs in reach.   Treatment Session 2:  Pt agreeable to PT and without verbal reports of pain. Pt propelled w/c ~150 ft with supervision for safety and transported remaining distance to main gym for energy conservation. Pt SBA with STS transfer and with navigation of 4 (6 inch ) steps with 2 HR's. Pt  required extensive rest break and reported fatigue. Engaged in discussion regarding discharge planning with patient and spouse. Pt returned to room and left seated in w/c at bedside with all needs in reach and spouse present.     Therapy/Group: Individual Therapy  Verl Dicker Verl Dicker PT, DPT  03/21/2022, 7:42 AM

## 2022-03-21 NOTE — Progress Notes (Signed)
Occupational Therapy Session Note  Patient Details  Name: CARTER KASSEL MRN: [id number] Date of Birth: 1955-12-29  Today's Date: 03/21/2022 OT Individual Time: 0900-0945 1st Session, 1300-1400 2nd Session  OT Individual Time Calculation (min): 45 min, 60 min    Short Term Goals: Week 2:  OT Short Term Goal 1 (Week 2): STGs = LTGs  Skilled Therapeutic Interventions/Progress Updates:  1st Session:  Pt seen for am OT session. Pt already completed sink side ADL's with NT and was completing meds with nursing upon OT arrival. Pt self propelled w/c to and from demo apt with S ~ 65 ft x 2. With RW place in front of w/c and sink, pt able to complete 4 trials of sit to stand with min A/CGA. Unilateral reaching for items placed R side and L side and in front for functional reach ~6-7 " without LOB. Pt then completed scap retraction UE green (med-heavy) tband therex seated level in 90 degree plane to avoid R scap/sh pain exacerbation as pt reported 0/10 this sesison. Once back in room, pt left w/c level for next session with safety measures, needs and nurse call button in reach.    2nd Session:  Pt seen for 2nd OT session this pm. Pt eager to leave unit to address community reintegration via w/c and RW integration as well as UE therex. Pt self propelled off unit and back ~ 100 ft x 2 and 50 ft x 2 with OT supporting on/off elevators for safety. Pt able to complete sit to stand x 4 trials on concrete level surface at outdoor table with push to stand technique each time and close S only. Once back on unit wife arrived and met [place] in ortho gym for 6 min NuStep therex. Pt demonstrated to wife new transfer technique with RW to access seat with close S. OT left pt w/c level with all safety needs and nurse call button in reach and wife bedside. Plan for Family Educ with wife for shower at 8 am tomorrow.   Therapy Documentation Precautions:  Precautions Precautions: None Precaution Comments: L colostomy,  monitor O2/HR Restrictions Weight Bearing Restrictions: No    Therapy/Group: Individual Therapy  Barnabas Lister 03/21/2022, 8:00 AM

## 2022-03-21 NOTE — Plan of Care (Signed)
  Problem: Consults Goal: RH GENERAL PATIENT EDUCATION Description: See Patient Education module for education specifics. Outcome: Progressing Goal: Skin Care Protocol Initiated - if Braden Score 18 or less Description: If consults are not indicated, leave blank or document N/A Outcome: Progressing   Problem: RH BOWEL ELIMINATION Goal: RH STG MANAGE BOWEL WITH ASSISTANCE Description: STG Manage colostomy with min Assistance. Outcome: Progressing   Problem: RH BLADDER ELIMINATION Goal: RH STG MANAGE BLADDER WITH ASSISTANCE Description: STG Manage Bladder With min Assistance Outcome: Progressing   Problem: RH SKIN INTEGRITY Goal: RH STG SKIN FREE OF INFECTION/BREAKDOWN Description: Skin will improve while in rehab and be free of additional infection.breakdown with min assist Outcome: Progressing Goal: RH STG MAINTAIN SKIN INTEGRITY WITH ASSISTANCE Description: STG Maintain Skin Integrity With min Assistance. Outcome: Progressing Goal: RH STG ABLE TO PERFORM INCISION/WOUND CARE W/ASSISTANCE Description: STG Able To Perform Incision/Wound Care With min Assistance. Outcome: Progressing   Problem: RH SAFETY Goal: RH STG ADHERE TO SAFETY PRECAUTIONS W/ASSISTANCE/DEVICE Description: STG Adhere to Safety Precautions With min Assistance/Device. Outcome: Progressing   Problem: RH PAIN MANAGEMENT Goal: RH STG PAIN MANAGED AT OR BELOW PT'S PAIN GOAL Description: Pain managed at 4 out of 10 on pain scale with PRN medications min assist  Outcome: Progressing   Problem: RH KNOWLEDGE DEFICIT GENERAL Goal: RH STG INCREASE KNOWLEDGE OF SELF CARE AFTER HOSPITALIZATION Description: Patient/caregiver will be able to managed medications, self care, and ostomy care independently from nursing education and nursing handouts.  Outcome: Progressing

## 2022-03-21 NOTE — Progress Notes (Signed)
Progress Note  Primary GI: Dr. [name]   Subjective  Chief Complaint:Severe Crohn's disease, sigmoid perforation, rectal ulcers     Wife at bedside.  Patient just got back from physical therapy. States he is doing well, no further rectal bleeding.  Requesting enemas to be stopped, it has been 2 weeks. Patient's eating and drinking without issues, no nausea and vomiting.  No fevers or chills. Patient has had elevated sugars been controlled by primary secondary to prednisone use. Tolerated second Remicade infusion well 01/03. Patient has tentative discharge date 01/10 and is excited to go home.    Objective   Vital signs in last 24 hours: Temp:  [98.2 F (36.8 C)-98.7 F (37.1 C)] 98.2 F (36.8 C) (01/04 1253) Pulse Rate:  [101-107] 101 (01/04 0613) Resp:  [16-20] 18 (01/04 1253) BP: (127-135)/(82-87) 127/82 (01/04 1253) SpO2:  [97 %] 97 % (01/04 1253) Last BM Date : 03/21/22 Last BM recorded by nurses in past 5 days Stool Type: Type 5 (Soft blobs with clear-cut edges) (03/21/2022  7:00 AM)  General:   male in no acute distress  Heart:  Regular rate and rhythm; no murmurs Pulm: Clear anteriorly; no wheezing Abdomen: Obese abdomen, soft, nontender, active bowel sounds.  Midline abdominal dressing intact, left abdominal colostomy stoma red without any ulcerations.  Brown soft stool in colostomy bag, no evidence of blood. Extremities:  without  edema. Neurologic:  Alert and  oriented x4;  No focal deficits.  Psych:  Cooperative. Normal mood and affect.  Intake/Output from previous day: 01/03 0701 - 01/04 0700 In: 14 [P.O.:338] Out: 3250 [Urine:2200; Stool:1050] Intake/Output this shift: Total I/O In: 480 [P.O.:480] Out: 500 [Stool:500]  Studies/Results: No results found.  Lab Results: Recent Labs    03/19/22 0452  WBC 8.8  HGB 10.1*  HCT 31.3*  PLT 158   BMET Recent Labs    03/19/22 0452  NA 136  K 3.8  CL 97*  CO2 29  GLUCOSE 173*  BUN 15   CREATININE 0.35*  CALCIUM 8.8*   LFT No results for input(s): "PROT", "ALBUMIN", "AST", "ALT", "ALKPHOS", "BILITOT", "BILIDIR", "IBILI" in the last 72 hours. PT/INR No results for input(s): "LABPROT", "INR" in the last 72 hours.   Scheduled Meds:  ascorbic acid  500 mg Oral Daily   atorvastatin  40 mg Oral Daily   Chlorhexidine Gluconate Cloth  6 each Topical Q12H   docusate sodium  100 mg Oral Daily   insulin aspart  0-15 Units Subcutaneous TID WC   insulin aspart  0-5 Units Subcutaneous QHS   insulin aspart  9 Units Subcutaneous TID WC   insulin detemir  20 Units Subcutaneous QHS   lip balm  1 Application Topical BID   magnesium oxide  800 mg Oral BID   metFORMIN  500 mg Oral Q breakfast   metroNIDAZOLE  500 mg Oral Q12H   pantoprazole  40 mg Oral Daily   [START ON 03/22/2022] predniSONE  35 mg Oral Q breakfast   Followed by   Derrill Memo ON 03/29/2022] predniSONE  30 mg Oral Q breakfast   Followed by   Derrill Memo ON 04/05/2022] predniSONE  25 mg Oral Q breakfast   Followed by   Derrill Memo ON 04/12/2022] predniSONE  20 mg Oral Q breakfast   Followed by   Derrill Memo ON 04/19/2022] predniSONE  15 mg Oral Q breakfast   Followed by   Derrill Memo ON 04/26/2022] predniSONE  10 mg Oral Q breakfast   Followed by   [  START ON 05/03/2022] predniSONE  5 mg Oral Q breakfast   sodium chloride flush  10-40 mL Intracatheter Q12H   thiamine  100 mg Oral Daily   zinc sulfate  220 mg Oral Daily   Continuous Infusions:  magnesium sulfate bolus IVPB 4 g (03/21/22 1632)     Impression/Plan:   Newly diagnosed severe, penetrating, advanced phenotypic colonic Crohn's disease diagnosed 01/2022 - Admitted with sigmoid perforation, requiring emergent surgery on 02/07/2022 with descending/sigmoid colectomy, transverse colostomy. -12/11 sigmoidoscopy performed emergently and notable for deep cratered rectal ulcers with bleeding stigmata, treated with PuraSTAT gel -02/26/22 IR drainage large LLQ abscess, grew E coli,  bacteroides. Flagyl since 12/13, Rocephin since 12/15.  Drain interrogated (no fistula, collapsed/resolved abscess) and removed on 12/20.   -12/18 repeat flexible sigmoidoscopy for recurrent bleed.  Cratered ulcers, but overall improved from the previous sigmoidoscopy.  Again treated any stigmata with PuraSTAT gel with resolution -12/20 started Remicade 12/20 along with scfa enemas for possible overlapping diversion colitis. -12/21 changed IV Solu-Medrol to prednisone 40 mg/day -01/03 Remicade infusion, tolerated well.  -Next Remicade infusion will be about 4 weeks from 01/03 (04/17/22) -Continue Flagyl tentative plan for 4 weeks course -Has had 2 weeks SCFE enemas, will discontinue at the time being. -Please notify on-call GI team if any further rectal bleeding. -Plan for discharge 03/27/2022 -Continue prednisone 40 mg/day starting 5 mg a week taper 03/21/2022.  Will need outpatient prescription for prednisone taper. -Will need hepatitis B vaccine series outpatient -Has outpatient follow-up in our office with Dr. [name] 04/04/2022 at [place] will sign off.  Please contact us if we can be of any further assistance during this hospital stay or if the patient has recurrent rectal bleeding. . Macrocytic anemia CBC on 03/19/2022   HGB 10.1 MCV 102.3 Platelets 158 Anemia studies on 02/16/2022  Iron 17 Ferritin 632 B12 1,429 Will recheck iron while in the hospital  Diabetes Difficult to control due to prednisone use Continue to monitor, will need close outpatient follow up until taper is complete 02/06.   Principal Problem:   Debility Active Problems:   Crohn's disease of large intestine with other complication ([place])   Depressive reaction    LOS: 13 days   Vladimir Crofts  03/21/2022, 4:37 PM

## 2022-03-21 NOTE — Progress Notes (Signed)
Pt does not wear cpap

## 2022-03-22 LAB — GLUCOSE, CAPILLARY
Glucose-Capillary: 179 mg/dL — ABNORMAL HIGH (ref 70–99)
Glucose-Capillary: 143 mg/dL — ABNORMAL HIGH (ref 70–99)
Glucose-Capillary: 323 mg/dL — ABNORMAL HIGH (ref 70–99)
Glucose-Capillary: 362 mg/dL — ABNORMAL HIGH (ref 70–99)

## 2022-03-22 LAB — MAGNESIUM: Magnesium: 1.6 mg/dL — ABNORMAL LOW (ref 1.7–2.4)

## 2022-03-22 MED ORDER — SODIUM CHLORIDE 0.9 % IV SOLN: INTRAVENOUS | Status: DC | PRN

## 2022-03-22 MED ORDER — MAGNESIUM SULFATE 4 GM/100ML IV SOLN
4.0000 g | Freq: Every day | INTRAVENOUS | Status: DC
  Administered 2022-03-22: 4 g via INTRAVENOUS
  Filled 2022-03-22 (×2): qty 100

## 2022-03-22 NOTE — Progress Notes (Signed)
Physical Therapy Session Note  Patient Details  Name: Richard [name] MRN: [id number] Date of Birth: September 04, 1955  Today's Date: 03/22/2022 PT Individual Time: 7494-4967 PT Individual Time Calculation (min): 43 min   Short Term Goals: Week 2:  PT Short Term Goal 1 (Week 2): STG=LTG due ELOS  Skilled Therapeutic Interventions/Progress Updates:     Pt received seated in [facility] and agrees to therapy. No complaint of pain. WC transport to gym for time management. Pt performs squat pivot transfer to mat with close supervision and cues for positioning. PT asks pt if he can stand without RW and says "no", and declines attempt. Pt performs sit to stand with RW and cues for hand placement. Pt practices balancing without upper extremity support, requiring CGA for safety. Seated rest break. Pt performs block training of sit to stand with cues for body mechanics, hand placement, and sequencing. Pt encouraged to attempt standing with bilateral upper extremity support on mat, requiring CGA/minA for safety due to slight overshot and anterior LOB. Pt performs total of x20 reps with seated rest breaks. PT discusses car transfer technique, as pt reports he is planning to use slideboard for car transfer. Sit to stand practiced with RW farther in front of pt to simulate car. Pt highly encouraged to perform actually car transfer prior to discharge. Pt then performs sit to stand and ambulates x175' with RW and CGA, with cues for upright gaze to improve posture and balance, and decreasing WB through RW for energy conservation and to increase loading through legs. WC transport back to room. Pt left seated in WC with all needs within reach.  Therapy Documentation Precautions:  Precautions Precautions: None Precaution Comments: L colostomy, monitor O2/HR Restrictions Weight Bearing Restrictions: No    Therapy/Group: Individual Therapy  Breck Coons, PT, DPT 03/22/2022, 5:11 PM

## 2022-03-22 NOTE — Progress Notes (Signed)
PROGRESS NOTE   Subjective/Complaints:  Ate 100% tray Really likes fruit since came into hospital.  HR back down to 90s.   Mg 1.6- after 4G IV Mg.   Per GI, needs hep B vaccines outpt and has f/u with GI 04/04/22- 8:20am with Dr [name].   Still using purewick at night- really likes it here in hospital.  ROS:    Pt denies SOB, abd pain, CP, N/V/C/D, and vision changes   Except for HPI  Objective:   No results found. No results for input(s): "WBC", "HGB", "HCT", "PLT" in the last 72 hours.   No results for input(s): "NA", "K", "CL", "CO2", "GLUCOSE", "BUN", "CREATININE", "CALCIUM" in the last 72 hours.    Intake/Output Summary (Last 24 hours) at 03/22/2022 0819 Last data filed at 03/22/2022 0700 Gross per 24 hour  Intake 653.16 ml  Output 3500 ml  Net -2846.84 ml     Pressure Injury 03/20/22 Buttocks Left Stage 2 -  Partial thickness loss of dermis presenting as a shallow open injury with a red, pink wound bed without slough. 0.2 x 0.3 x 0.1 pink (Active)  03/20/22 1000  Location: Buttocks  Location Orientation: Left  Staging: Stage 2 -  Partial thickness loss of dermis presenting as a shallow open injury with a red, pink wound bed without slough.  Wound Description (Comments): 0.2 x 0.3 x 0.1 pink  Present on Admission: No    Physical Exam: Vital Signs Blood pressure 138/88, pulse 92, temperature 98.6 F (37 C), temperature source Oral, resp. rate 19, height '5\' 9"'$  (1.753 m), weight 96.3 kg, SpO2 98 %.           General: awake, alert, appropriate, sitting up- almost done with breakfast tray; NAD HENT: conjugate gaze; oropharynx moist CV: regular rate- in 90s- regular rhythm; no JVD Pulmonary: CTA B/L; no W/R/R- good air movement GI: soft, NT, ND, (+)BS Psychiatric: appropriate Neurological: Ox3  GU- has purewick in place- container 1/2 full still Psych: pleasant, normal affect Skin:  abdominal dressing in place- however on inspection, has great granulation- ~ 2cm in width- spot ~ 4 cm in length that has some slough, but otherwise, looks great- ~ 2-36m in depth.  Neuro: 5/5 strength BUE, 4/5 BLE   Assessment/Plan: 1. Functional deficits which require 3+ hours per day of interdisciplinary therapy in a comprehensive inpatient rehab setting. Physiatrist is providing close team supervision and 24 hour management of active medical problems listed below. Physiatrist and rehab team continue to assess barriers to discharge/monitor patient progress toward functional and medical goals  Care Tool:  Bathing    Body parts bathed by patient: Right arm, Left arm, Chest, Right upper leg, Left upper leg, Face, Abdomen, Buttocks, Right lower leg, Left lower leg, Front perineal area   Body parts bathed by helper: Right lower leg, Left lower leg, Front perineal area, Buttocks     Bathing assist Assist Level: Contact Guard/Touching assist     Upper Body Dressing/Undressing Upper body dressing   What is the patient wearing?: Pull over shirt    Upper body assist Assist Level: Independent    Lower Body Dressing/Undressing Lower body dressing  What is the patient wearing?: Pants, Incontinence brief     Lower body assist Assist for lower body dressing: Minimal Assistance - Patient > 75%     Toileting Toileting    Toileting assist Assist for toileting: Moderate Assistance - Patient 50 - 74%     Transfers Chair/bed transfer  Transfers assist     Chair/bed transfer assist level: Minimal Assistance - Patient > 75%     Locomotion Ambulation   Ambulation assist   Ambulation activity did not occur: Safety/medical concerns  Assist level: Contact Guard/Touching assist Assistive device: Walker-rolling Max distance: 177 ft   Walk 10 feet activity   Assist  Walk 10 feet activity did not occur: Safety/medical concerns  Assist level: Contact Guard/Touching  assist Assistive device: Walker-rolling   Walk 50 feet activity   Assist Walk 50 feet with 2 turns activity did not occur: Safety/medical concerns  Assist level: Contact Guard/Touching assist Assistive device: Walker-rolling    Walk 150 feet activity   Assist Walk 150 feet activity did not occur: Safety/medical concerns  Assist level: Contact Guard/Touching assist Assistive device: Walker-rolling    Walk 10 feet on uneven surface  activity   Assist Walk 10 feet on uneven surfaces activity did not occur: Safety/medical concerns         Wheelchair     Assist Is the patient using a wheelchair?: Yes Type of Wheelchair: Manual    Wheelchair assist level: Supervision/Verbal cueing Max wheelchair distance: 177 ft    Wheelchair 50 feet with 2 turns activity    Assist        Assist Level: Supervision/Verbal cueing   Wheelchair 150 feet activity     Assist      Assist Level: Supervision/Verbal cueing   Blood pressure 138/88, pulse 92, temperature 98.6 F (37 C), temperature source Oral, resp. rate 19, height '5\' 9"'$  (1.753 m), weight 96.3 kg, SpO2 98 %.  Medical Problem List and Plan: 1. Functional deficits secondary to debility             -patient may shower             -ELOS/Goals: 10-14 days S         will also ask therapy to stop by 3:45 as well as inform them about Remicade next Wednesday for 4 hours.   1/2- CAN HAVE SHOWER- cover the abd wound  1/4- d/c 1/10  Con't CIR - PT and OT-- wife doing family training- 2.  Antithrombotics: -DVT/anticoagulation:  Mechanical: Sequential compression devices, below knee Bilateral lower extremities             -antiplatelet therapy: N/A 3. Pain Management: tylenol prn 4. Mood/Behavior/Sleep: LCSW to follow for evaluation and support.              -antipsychotic agents: N/A 5. Neuropsych/cognition: This patient is capable of making decisions on his own behalf. 6. Skin/Wound Care: Wet to dry dressing  changes daily to midline.              --continue Zinc. Add vitamin C   1/2- abd wound healing grea-t will assess L buttock DTI in AM 7. Fluids/Electrolytes/Nutrition: Monitor I/O. Check CMET in am             - 8. Sigmoid perforation s/p resection w/abscess: Continue Duricef and Flagyl thru 12/26 9. Rectal ulcers:  Continue short chain fatty acid edemas daily, changed timing to 4pm as per patient's preferences. Please find out from GI how long these  should continue  12/28- con't Flagyl per 4 weeks pending course of remicade.   12/29- spoke with pharmacy- will try to arrange remicade for next Wednesday- will let therapy know since will take 4 hours  12/30- Pharmacy says doesn't need infusion monitoring 2nd time like did first time.   1/3- have infusion nurse coming for remicade infusion today at noon- double checked with nursing to verify  1/4- went well-   1/5- Needs f/u with Dr [name] 04/04/22 at 8:20 at [place] also need Hep B vaccines outpt -the series 10. Crohn's disease: Remicade given 12/20 and repeat dose in 2 weeks per GI             --continue prednisone 40 mg X 2 weeks followed by 5 mg/week taper. 12/29- will start steroid taper as of 03/21/22  per GI  11. T2DM: Hgb A1C-6.1 and BS poorly controlled due to steroids/immobility.             --will monitor BS ac/hs and titrate insulin as indicated.             --continue Levemir 14 units with  6 units novolog for meal coverage.   -12/26 Increase Levemir to 16u  12/28- will increase levemir to 18 units since still running 150s to 300s- can increase Meal coverage tomorrow.   1/1- Cbgs 128 this AM- up to 290 with meals- will increase meal insulin to 9 units and monitor- will make changes once start to wean prednisone  1/3- increased levemir to 20 units at night- will reduce steroids as of 1/5 AM- so will improve over next few days, hopefully  1/4- Cbgs 119 to 296- so is doing somewhat better- con't regimen  1/5- CBGs 98 to  292overall a little better- con't regimen, but as Prednisone decreases, might need more titration on insulin  CBG (last 3)  Recent Labs    03/21/22 1612 03/21/22 2056 03/22/22 0557  GLUCAP 292* 269* 179*    12. Fluid overload/Pleural effusions: Daily wts with monitoring for overload             --intake improved and may need to add Low salt restrictions. Monitor daily weights  1/2- abruptly increased 3 kg between 12/31 to 1/1- will monitor- since otherwise, doesn't appear fluid overloaded-will see if can do standing weights.   1/3- asked nursing to do standing weights- not done yet today  1/4- will ask nursing for standing weight- don't see in computer  1/5- weight doing well- being checked standing- con't regimen Filed Weights   03/19/22 0519 03/20/22 1100 03/22/22 0459  Weight: 98.5 kg 96.6 kg 96.3 kg     13. Hypomagnesemia:  -Mg 1.3-->1.5 and till low. Will add oral supplement.             12/23: Mag is 1.4, ordered 2grams IV supplement             12/24: repeat magnesium ordered today  12/26 MG is 1.5, called pharmacy-recommended 4g x1 and recheck in AM, appreciate assistance  2grams IV mag ordered 12/27.   12/28- will recheck in AM and determine what what else is needed for Mg.   12/29- will give 4 G IV since Mg down to 1.4 this AM- will recheck in AM  12/30- Mg 1.5- only slightly up with 4 G IV- pt agreed to start PO Mg 400 mg BID started  12/31- will recheck in AM- Mg 1.6 today-   1/1- Mg 1.5- will replete again with  4G Mg- likely losing some in colostomy- con't PO Mg as well  1/2- Mg 1.5 again- will re-plete Mg again and increase Po MgOx to 800 mg BID- recheck in AM  1/3- Mg up to 1.7! Will give 2G IV MG and con't PO Mg  1/4- Mg 1.5 again this AM- will give 4G IV Mg again- likely due to colostomy?- will check daily for now  1/5- will replete 4G IV Mg daily x 3 days- will allow weekend physician to d/c if Mg goes high, but it's been running low for weeks- on Monday will  reassess repletion.  14. Severe OSA: Discussed need for CPAP/oxygen does not treat underling issues             --declines CPAP "too much at this time/can't lie on the left"  1/5- continues to decline CPAP.  15. Tachycardic: EKG ordered and shows sinus tachycardia. May be 2/2 steroids, check magnesium level today. Discussed magnesium can help with tachycardia  -HR around 100, improved   1/1- HR running 90s-low 100s- con't regimen  1/4- HR back in low 100's this AM- con't regimen  1/5- HR in 90s this AM   I spent a total of 37   minutes on total care today- >50% coordination of care- due to d/w pharmacy about IV Mg- and reviewing labs and GI notes- they are signing off.     LOS: 14 days A FACE TO FACE EVALUATION WAS PERFORMED  Richard [name] 03/22/2022, 8:19 AM

## 2022-03-22 NOTE — Progress Notes (Signed)
Physical Therapy Session Note  Patient Details  Name: Richard [name] MRN: [id number] Date of Birth: [birth year]  Today's Date: 03/22/2022 PT Individual Time: 1053-1120 PT Individual Time Calculation (min): 27 min   Short Term Goals: Week 1:  PT Short Term Goal 1 (Week 1): pt will perform supine to/from sit w/mod assist of 1 PT Short Term Goal 1 - Progress (Week 1): Met PT Short Term Goal 2 (Week 1): pt will perform sit to stand from elevated surface w/mod assist of 2 PT Short Term Goal 2 - Progress (Week 1): Met PT Short Term Goal 3 (Week 1): Pt will transfer bed <> w/c w/ min A PT Short Term Goal 3 - Progress (Week 1): Met PT Short Term Goal 4 (Week 1): Pt will negotiate w/c 60' w/ supervision PT Short Term Goal 4 - Progress (Week 1): Met PT Short Term Goal 5 (Week 1): PT will assess gait. PT Short Term Goal 5 - Progress (Week 1): Met Week 2:  PT Short Term Goal 1 (Week 2): STG=LTG due ELOS Week 3:     Skilled Therapeutic Interventions/Progress Updates:   Pt received sitting in WC and agreeable to PT  WC mobility.   Nustep x `76mn level 5-6.   Lateral scoot transfer.   Sit<>stand transfer.   Gait training with RW x 1270fin orthogym   [facility] to [facility] and all needs met.        Therapy Documentation Precautions:  Precautions Precautions: None Precaution Comments: L colostomy, monitor O2/HR Restrictions Weight Bearing Restrictions: No General:   Vital Signs:   Pain:   Mobility:   Locomotion :    Trunk/Postural Assessment :    Balance:   Exercises:   Other Treatments:      Therapy/Group: Individual Therapy  AuLorie Phenix/07/2022, 11:22 AM

## 2022-03-22 NOTE — Progress Notes (Signed)
Pt does not wear CPAP at home and does not wish to wear hospital machine.

## 2022-03-22 NOTE — Progress Notes (Signed)
Occupational Therapy Session Note  Patient Details  Name: Richard [name] MRN: [id number] Date of Birth: [birth year]  Today's Date: 03/22/2022 OT Individual Time: 0952-1050 OT Individual Time Calculation (min): 58 min    Short Term Goals: Week 2:  OT Short Term Goal 1 (Week 2): STGs = LTGs  Skilled Therapeutic Interventions/Progress Updates:    Pt greeted seated in wc and agreeable to OT treatment session. Pt propelled wc to therapy gym for UB strength and endurance. Pt ambulated to therapy mat 10 feet w/ RW and CGA. Pt completed 3 sets of 10 seated toe taps on 4 inch step. Progressed to stepping up and down over 4 inch step with RW and CGA. 3 sets of 5 single leg step ups (5 per leg) with RW and CGA.Addressed standing balance/endurance standing on foam mat. Pt with increased difficulty with balance challenge reqiuring min A. [employer] B arm raise while standing on mat with min A for balance. Pt then ambulated off of foam with RW and CGA. Pt propelled wc back to room and left seated in wc with spouse present and needs met.   Therapy Documentation Precautions:  Precautions Precautions: None Precaution Comments: L colostomy, monitor O2/HR Restrictions Weight Bearing Restrictions: No Pain:  Denies pain ADL:    Therapy/Group: Individual Therapy  Valma Cava 03/22/2022, 10:28 AM

## 2022-03-22 NOTE — Progress Notes (Signed)
Occupational Therapy Weekly Progress Note  Patient Details  Name: Richard [name] MRN: [id number] Date of Birth: 1955-05-09  Beginning of progress report period: March 16, 2023 End of progress report period: February 20, 2023  Today's Date: 03/22/2022 OT Individual Time: 5797-2820 OT Individual Time Calculation (min): 74 min    Patient progressing now toward LTG's and approaching met in time for planned discharge Wed 03/27/22. Pt has now begun to use RW exclusively with bathroom and self care transfers and mobility with min A/CGA. Wife completed shower and self care training including colostomy mngt and replacement and worked post-shower with nursing for wound care training. Pt now standing with functional time supported. Will continue to progress RW use for ADL/IADL's.   Patient continues to demonstrate the following deficits: muscle weakness, decreased cardiorespiratoy endurance, impaired timing and sequencing and unbalanced muscle activation, and decreased standing balance and decreased balance strategies and therefore will continue to benefit from skilled OT intervention to enhance overall performance with BADL, iADL, Vocation, and Reduce care partner burden.  Patient progressing toward long term goals..  Continue plan of care.  OT Short Term Goals Week 2:  OT Short Term Goal 1 (Week 2): STGs = LTGs OT Short Term Goal 1 - Progress (Week 2): Progressing toward goal Week 3:  OT Short Term Goal 1 (Week 3): STGs = LTGs with steady progression for d/c 03/27/22  Skilled Therapeutic Interventions/Progress Updates:   OT session focussed on family educ with wife [name]. Pt in bed upon OT arrival and S for bed mobility and w/c transfers laterally via squat pivot. Shower training completed with wife. Pt required CGA/min A with transfer to and from TTB with RW for SPT, set up for all bathing excluding buttocks with min A. Once in supine, colostomy changed by wife post-shower and nursing followed up with  wound care. Wife feels comfortable with shower and self care carryover to home and OT assisted with recommendations for [facility] shower hose selection and set up. Pt reports RW and slide board will be delivered today for d/c prep. Pt left in care of nursing with wife bedside.  Therapy Documentation Precautions:  Precautions Precautions: None Precaution Comments: L colostomy, monitor O2/HR Restrictions Weight Bearing Restrictions: No     Therapy/Group: Individual Therapy  Barnabas Lister 03/22/2022, 7:47 AM

## 2022-03-23 LAB — GLUCOSE, CAPILLARY
Glucose-Capillary: 222 mg/dL — ABNORMAL HIGH (ref 70–99)
Glucose-Capillary: 153 mg/dL — ABNORMAL HIGH (ref 70–99)
Glucose-Capillary: 362 mg/dL — ABNORMAL HIGH (ref 70–99)
Glucose-Capillary: 173 mg/dL — ABNORMAL HIGH (ref 70–99)

## 2022-03-23 LAB — MAGNESIUM: Magnesium: 1.6 mg/dL — ABNORMAL LOW (ref 1.7–2.4)

## 2022-03-23 MED ORDER — METFORMIN HCL 500 MG PO TABS
500.0000 mg | ORAL_TABLET | Freq: Two times a day (BID) | ORAL | Status: DC
  Administered 2022-03-23 – 2022-03-25 (×4): 500 mg via ORAL
  Filled 2022-03-23 (×4): qty 1

## 2022-03-23 MED ORDER — MAGNESIUM SULFATE 4 GM/100ML IV SOLN
4.0000 g | Freq: Every day | INTRAVENOUS | Status: DC
  Administered 2022-03-23: 4 g via INTRAVENOUS
  Filled 2022-03-23 (×2): qty 100

## 2022-03-23 NOTE — Progress Notes (Signed)
Physical Therapy Session Note  Patient Details  Name: Richard [name] MRN: [id number] Date of Birth: October 21, 1955  Today's Date: 03/23/2022 PT Individual Time: 1020-1103 PT Individual Time Calculation (min): 43 min   Short Term Goals: Week 2:  PT Short Term Goal 1 (Week 2): STG=LTG due ELOS  Skilled Therapeutic Interventions/Progress Updates:    Chart reviewed and pt agreeable to therapy. Pt received seated in WC with no c/o pain. Also of note, pt found without chair alarm with RN confirmed pt is cleared to not have alarm. Session focused on functional transfers, dynamic balance, ambulation endurance, and stair management to prepare for return to home. Pt initiated session with sit to stand using CGA + RW and completed 52mns standing for checkers game with supervision. Pt then completed [id number] with CGA+RW. Pt expressed desire to practice stairs where he completed 4 steps with CGA + B handrails. Pt returned to room and talked with PT about mobility plans in the home for d/c. At end of session, [facility] and all needs in reach.     Therapy Documentation Precautions:  Precautions Precautions: None Precaution Comments: L colostomy, monitor O2/HR Restrictions Weight Bearing Restrictions: No    Therapy/Group: Individual Therapy  KiMarquette Old/08/2022, 12:50 PM

## 2022-03-23 NOTE — Progress Notes (Signed)
PROGRESS NOTE   Subjective/Complaints:  Doing well today, but frustrated that his magnesium infusion wasn't completed on time yesterday-- says he called for 4hrs for someone to disconnect him from it. Wants it discontinued if possible.  Getting up to use restroom.  CBGs running higher than usual from steroids, at home he's on Metforming '500mg'$  BID and Jardiance '10mg'$  QD and his CBGs are usually in the 120s range. Admits he's eating some sugary foods that he doesn't usually eat at home.  Sleeping well, having good BMs. Denies any other complaints or concerns at this time.   ROS: Pt denies CP, SOB, abd pain, N/V/D/C, and vision changes   Objective:   No results found. No results for input(s): "WBC", "HGB", "HCT", "PLT" in the last 72 hours.   No results for input(s): "NA", "K", "CL", "CO2", "GLUCOSE", "BUN", "CREATININE", "CALCIUM" in the last 72 hours.  Lab Results  Component Value Date   MG 1.6 (L) 03/23/2022   MG 1.6 (L) 03/22/2022   MG 1.5 (L) 03/21/2022      Intake/Output Summary (Last 24 hours) at 03/23/2022 0709 Last data filed at 03/23/2022 0700 Gross per 24 hour  Intake 358.69 ml  Output 2400 ml  Net -2041.31 ml      Pressure Injury 03/20/22 Buttocks Left Stage 2 -  Partial thickness loss of dermis presenting as a shallow open injury with a red, pink wound bed without slough. 0.2 x 0.3 x 0.1 pink (Active)  03/20/22 1000  Location: Buttocks  Location Orientation: Left  Staging: Stage 2 -  Partial thickness loss of dermis presenting as a shallow open injury with a red, pink wound bed without slough.  Wound Description (Comments): 0.2 x 0.3 x 0.1 pink  Present on Admission: No    Physical Exam: Vital Signs Blood pressure 134/83, pulse (!) 105, temperature 98.7 F (37.1 C), temperature source Oral, resp. rate 18, height '5\' 9"'$  (1.753 m), weight 96.3 kg, SpO2 99 %.  General: awake, alert, appropriate, sitting up;  NAD HENT: conjugate gaze; oropharynx moist CV: borderline tachycardia in the low 100s, regular rhythm; no JVD Pulmonary: CTA B/L; no W/R/R- good air movement GI: soft, NT, ND, (+)BS, midline surgical wound covered but skin C/D/I around it, colostomy in LUQ with clean bag, pink stoma.  Psychiatric: appropriate Neurological: Ox3  GU- has purewick in place- container 1/2 full still Psych: pleasant, normal affect Skin: abdominal dressing in place- however on inspection, has great granulation- ~ 2cm in width- spot ~ 4 cm in length that has some slough, but otherwise, looks great- ~ 2-88m in depth-- dressing not removed during today's exam Neuro: 5/5 strength BUE, 4/5 BLE   Assessment/Plan: 1. Functional deficits which require 3+ hours per day of interdisciplinary therapy in a comprehensive inpatient rehab setting. Physiatrist is providing close team supervision and 24 hour management of active medical problems listed below. Physiatrist and rehab team continue to assess barriers to discharge/monitor patient progress toward functional and medical goals  Care Tool:  Bathing    Body parts bathed by patient: Right arm, Left arm, Chest, Right upper leg, Left upper leg, Face, Abdomen, Buttocks, Right lower leg, Left lower leg, Front perineal  area   Body parts bathed by helper: Right lower leg, Left lower leg, Front perineal area, Buttocks     Bathing assist Assist Level: Contact Guard/Touching assist     Upper Body Dressing/Undressing Upper body dressing   What is the patient wearing?: Pull over shirt    Upper body assist Assist Level: Independent    Lower Body Dressing/Undressing Lower body dressing      What is the patient wearing?: Pants, Incontinence brief     Lower body assist Assist for lower body dressing: Minimal Assistance - Patient > 75%     Toileting Toileting    Toileting assist Assist for toileting: Moderate Assistance - Patient 50 - 74%     Transfers Chair/bed  transfer  Transfers assist     Chair/bed transfer assist level: Minimal Assistance - Patient > 75%     Locomotion Ambulation   Ambulation assist   Ambulation activity did not occur: Safety/medical concerns  Assist level: Contact Guard/Touching assist Assistive device: Walker-rolling Max distance: 177 ft   Walk 10 feet activity   Assist  Walk 10 feet activity did not occur: Safety/medical concerns  Assist level: Contact Guard/Touching assist Assistive device: Walker-rolling   Walk 50 feet activity   Assist Walk 50 feet with 2 turns activity did not occur: Safety/medical concerns  Assist level: Contact Guard/Touching assist Assistive device: Walker-rolling    Walk 150 feet activity   Assist Walk 150 feet activity did not occur: Safety/medical concerns  Assist level: Contact Guard/Touching assist Assistive device: Walker-rolling    Walk 10 feet on uneven surface  activity   Assist Walk 10 feet on uneven surfaces activity did not occur: Safety/medical concerns         Wheelchair     Assist Is the patient using a wheelchair?: Yes Type of Wheelchair: Manual    Wheelchair assist level: Supervision/Verbal cueing Max wheelchair distance: 177 ft    Wheelchair 50 feet with 2 turns activity    Assist        Assist Level: Supervision/Verbal cueing   Wheelchair 150 feet activity     Assist      Assist Level: Supervision/Verbal cueing   Blood pressure 134/83, pulse (!) 105, temperature 98.7 F (37.1 C), temperature source Oral, resp. rate 18, height '5\' 9"'$  (1.753 m), weight 96.3 kg, SpO2 99 %.  Medical Problem List and Plan: 1. Functional deficits secondary to debility             -patient may shower             -ELOS/Goals: 10-14 days S -will also ask therapy to stop by 3:45 as well as inform them about Remicade next Wednesday for 4 hours.   -1/2- CAN HAVE SHOWER- cover the abd wound  -1/4- d/c 1/10  -Con't CIR - PT and OT-- wife  doing family training 2.  Antithrombotics: -DVT/anticoagulation:  Mechanical: Sequential compression devices, below knee Bilateral lower extremities             -antiplatelet therapy: N/A 3. Pain Management: tylenol prn 4. Mood/Behavior/Sleep: LCSW to follow for evaluation and support.              -antipsychotic agents: N/A 5. Neuropsych/cognition: This patient is capable of making decisions on his own behalf. 6. Skin/Wound Care: Wet to dry dressing changes daily to midline.              -continue Zinc '220mg'$  QD. Add vitamin C '500mg'$  QD  -1/2- abd wound healing  great will assess L buttock DTI in AM 7. Fluids/Electrolytes/Nutrition: Monitor I/O. Check CMET in am             -03/23/22 Mg 1.6, see #13 below  -Monitor weekly labs, next 03/26/22 8. Sigmoid perforation s/p resection w/abscess: Continue Duricef and Flagyl thru 12/26-- completed Duricef, continue Flagyl x4wks (started 02/27/22)  9. Rectal ulcers:  Continue short chain fatty acid enemas daily, changed timing to 4pm as per patient's preferences. Please find out from GI how long these should continue -12/28- con't Flagyl per 4 weeks (start 02/27/22) pending course of remicade.  -12/29- spoke with pharmacy- will try to arrange remicade for next Wednesday- will let therapy know since will take 4 hours -12/30- Pharmacy says doesn't need infusion monitoring 2nd time like did first time.   -1/3- have infusion nurse coming for remicade infusion today at noon -1/5- Needs f/u with Dr [name] 04/04/22 at 8:20 at [facility] also need Hep B vaccines outpt -the series -03/23/21 per GI, SCFA enemas discontinued, pt thrilled 10. Crohn's disease: Remicade given 12/20 and repeat dose in 2 weeks per GI -continue prednisone 40 mg X 2 weeks followed by 5 mg/week taper  -12/29- will start steroid taper as of 03/21/22  per GI  -03/23/22 tapered prednisone to '35mg'$  as of 03/22/22 dose-- continue; remicade infusion 03/20/22, next dose 04/03/22 11. T2DM: Hgb A1C-6.1  and BS poorly controlled due to steroids/immobility.             -will monitor BS ac/hs and titrate insulin as indicated.             -continue Levemir 14 units with  6 units novolog for meal coverage.   -12/26 Increase Levemir to 16u -12/28- will increase levemir to 18 units since still running 150s to 300s- can increase Meal coverage tomorrow.  -1/1- Cbgs 128 this AM- up to 290 with meals- will increase meal insulin to 9 units and monitor- will make changes once start to wean prednisone -1/3- increased levemir to 20 units at night- will reduce steroids as of 1/5 AM- so will improve over next few days, hopefully  -1/4- Cbgs 119 to 296- so is doing somewhat better- con't regimen -1/5- CBGs 98 to 292overall a little better- con't regimen, but as Prednisone decreases, might need more titration on insulin -03/23/21 CBGs 200-300s, sugary intake per pt; increase Metformin to home dosing ('500mg'$  BID up from QD) and monitor   CBG (last 3)  Recent Labs    03/22/22 1616 03/22/22 2137 03/23/22 0616  GLUCAP 323* 362* 222*     12. Fluid overload/Pleural effusions: Daily wts with monitoring for overload -intake improved and may need to add Low salt restrictions. Monitor daily weights -1/2- abruptly increased 3 kg between 12/31 to 1/1- will monitor- since otherwise, doesn't appear fluid overloaded-will see if can do standing weights.   -1/3- asked nursing to do standing weights- not done yet today  -1/5- weight doing well- being checked standing- con't regimen  -03/23/22 wt not done, nursing aware, will monitor  Filed Weights   03/19/22 0519 03/20/22 1100 03/22/22 0459  Weight: 98.5 kg 96.6 kg 96.3 kg     13. Hypomagnesemia:  -Mg 1.3-->1.5 and till low. Will add oral supplement.             -12/23: Mag is 1.4, ordered 2grams IV supplement             -12/24: repeat magnesium ordered today -12/26 MG is 1.5, called pharmacy-recommended  4g x1 and recheck in AM, appreciate assistance  -2grams IV mag  ordered 12/27.   -12/29- will give 4 G IV since Mg down to 1.4 this AM- will recheck in AM -12/30- Mg 1.5- only slightly up with 4 G IV- pt agreed to start PO Mg 400 mg BID started  -12/31- will recheck in AM- Mg 1.6 today-  -1/1- Mg 1.5- will replete again with 4G Mg- likely losing some in colostomy- con't PO Mg as well -1/2- Mg 1.5 again- will re-plete Mg again and increase Po MgOx to 800 mg BID- recheck in AM  -1/3- Mg up to 1.7! Will give 2G IV MG and con't PO Mg -1/4- Mg 1.5 again this AM- will give 4G IV Mg again- likely due to colostomy?- will check daily for now -1/5- will replete 4G IV Mg daily x 3 days- will allow weekend physician to d/c if Mg goes high, but it's been running low for weeks- on Monday will reassess repletion -03/23/22 Mg 1.6, pt frustrated by IV Mg disconnection timing, will adjust timing of dose today to see if this helps with schedule issue; spoke with pharmacy, MgOx dosing is max currently ('800mg'$  BID), and would provide more elemental Mag than Mg Gluconate so continue this regimen for now would make more sense. Will reassess tomorrow  14. Severe OSA: Discussed need for CPAP/oxygen does not treat underling issues             -declines CPAP "too much at this time/can't lie on the left"  -1/6- continues to decline CPAP.  15. Tachycardic: EKG ordered and shows sinus tachycardia. May be 2/2 steroids, check magnesium level today. Discussed magnesium can help with tachycardia  -HR around 100, improved   -1/1- HR running 90s-low 100s- con't regimen  -1/4- HR back in low 100's this AM- con't regimen  -1/5- HR in 90s this AM  -03/23/22 HR low 100's, continue to monitor   I spent a total of 40   minutes on total care today- >50% coordination of care- due to d/w pharmacy about IV Mg- and reviewing labs and nursing notes.      LOS: 15 days A FACE TO [place] 03/23/2022, 7:09 AM

## 2022-03-23 NOTE — Progress Notes (Signed)
Occupational Therapy Session Note  Patient Details  Name: Richard [name] MRN: [id number] Date of Birth: February 27, 1956  Today's Date: 03/23/2022 OT Individual Time: 1445-1530 OT Individual Time Calculation (min): 45 min    Short Term Goals: Week 3:  OT Short Term Goal 1 (Week 3): STGs = LTGs with steady progression for d/c 03/27/22  Skilled Therapeutic Interventions/Progress Updates:   Pt seen for skilled OT session this pm. Pt's wife present for initial portion of visit to ensure no further areas need training for Family Educ. Pt's wife and pt report they both feel prepared for discharge next week and pt eager to continue to progress sit to stand, amb during ADL's and light IADL's as well as strength and balance. Pt then self propelled to main gym 75 ft x 2 from room and set up w/c indep for sit to/from stand to shopping cart for trial with amb as pt's most enjoyable leisure is grocery shopping with wife. OT provided min A to slow cart down but overall CGA sit to stand and amb 75 ft x 2 trials with brief seated rest. Min cues for breathing. Pt then completed 3 sets of 10 reps B bicep curls with 4 lbs. Rests between sets. Back in room, pt was excited for childhood friends coming to visit shortly. Left pt w/c level with all safety needs and items in place/reach. No pain reported.   Therapy Documentation Precautions:  Precautions Precautions: None Precaution Comments: L colostomy, monitor O2/HR Restrictions Weight Bearing Restrictions: No   Therapy/Group: Individual Therapy  Richard [name] 03/23/2022, 7:26 AM

## 2022-03-23 NOTE — Plan of Care (Signed)
  Problem: Consults Goal: RH GENERAL PATIENT EDUCATION Description: See Patient Education module for education specifics. Outcome: Progressing Goal: Skin Care Protocol Initiated - if Braden Score 18 or less Description: If consults are not indicated, leave blank or document N/A Outcome: Progressing   Problem: RH BOWEL ELIMINATION Goal: RH STG MANAGE BOWEL WITH ASSISTANCE Description: STG Manage colostomy with min Assistance. Outcome: Progressing   Problem: RH BLADDER ELIMINATION Goal: RH STG MANAGE BLADDER WITH ASSISTANCE Description: STG Manage Bladder With min Assistance Outcome: Progressing   Problem: RH SKIN INTEGRITY Goal: RH STG SKIN FREE OF INFECTION/BREAKDOWN Description: Skin will improve while in rehab and be free of additional infection.breakdown with min assist Outcome: Progressing Goal: RH STG MAINTAIN SKIN INTEGRITY WITH ASSISTANCE Description: STG Maintain Skin Integrity With min Assistance. Outcome: Progressing Goal: RH STG ABLE TO PERFORM INCISION/WOUND CARE W/ASSISTANCE Description: STG Able To Perform Incision/Wound Care With min Assistance. Outcome: Progressing   Problem: RH SAFETY Goal: RH STG ADHERE TO SAFETY PRECAUTIONS W/ASSISTANCE/DEVICE Description: STG Adhere to Safety Precautions With min Assistance/Device. Outcome: Progressing   Problem: RH PAIN MANAGEMENT Goal: RH STG PAIN MANAGED AT OR BELOW PT'S PAIN GOAL Description: Pain managed at 4 out of 10 on pain scale with PRN medications min assist  Outcome: Progressing   Problem: RH KNOWLEDGE DEFICIT GENERAL Goal: RH STG INCREASE KNOWLEDGE OF SELF CARE AFTER HOSPITALIZATION Description: Patient/caregiver will be able to managed medications, self care, and ostomy care independently from nursing education and nursing handouts.  Outcome: Progressing

## 2022-03-24 LAB — GLUCOSE, CAPILLARY
Glucose-Capillary: 98 mg/dL (ref 70–99)
Glucose-Capillary: 128 mg/dL — ABNORMAL HIGH (ref 70–99)
Glucose-Capillary: 268 mg/dL — ABNORMAL HIGH (ref 70–99)
Glucose-Capillary: 232 mg/dL — ABNORMAL HIGH (ref 70–99)
Glucose-Capillary: 228 mg/dL — ABNORMAL HIGH (ref 70–99)

## 2022-03-24 LAB — MAGNESIUM: Magnesium: 1.6 mg/dL — ABNORMAL LOW (ref 1.7–2.4)

## 2022-03-24 NOTE — Progress Notes (Signed)
Occupational Therapy Discharge Summary  Patient Details  Name: Richard [name] MRN: [id number] Date of Birth: [birth year]  Date of Discharge from OT service:{Time; dates multiple:304500300}  {CHL IP REHAB OT TIME CALCULATIONS:304400400}   Patient has met 12 of 12 long term goals due to improved activity tolerance, improved balance, postural control, ability to compensate for deficits, functional use of  RIGHT upper, RIGHT lower, LEFT upper, and LEFT lower extremity, improved attention, improved awareness, and improved coordination.  Patient to discharge at [facility] Assist level.  Patient's care partner is independent to provide the necessary physical assistance at discharge.    Reasons goals not met: n/a  Recommendation:  Patient will benefit from ongoing skilled OT services in home health setting to continue to advance functional skills in the area of BADL, iADL, Vocation, and Reduce care partner burden.  Equipment: W/c and cushion, pt had RW and TB delivered to home, wife ordered [facility] shower head online, already has reacher, shower bench   Reasons for discharge: treatment goals met  Patient/family agrees with progress made and goals achieved: Yes  OT Discharge Precautions/Restrictions    General   Vital Signs Therapy Vitals Temp: 98.9 F (37.2 C) Temp Source: Oral Pulse Rate: (!) 110 Resp: 18 BP: 123/79 Patient Position (if appropriate): Lying Oxygen Therapy SpO2: 95 % O2 Device: Room Air Pain   ADL ADL Eating: Independent Where Assessed-Eating: Bed level Grooming: Independent Where Assessed-Grooming: Sitting at sink Upper Body Bathing: Setup Where Assessed-Upper Body Bathing: Sitting at sink Lower Body Bathing: Supervision/safety Where Assessed-Lower Body Bathing: Sitting at sink Upper Body Dressing: Setup Where Assessed-Upper Body Dressing: Wheelchair Lower Body Dressing: Supervision/safety Where Assessed-Lower Body Dressing: Wheelchair Toileting: Minimal  assistance (colostomy care) Where Assessed-Toileting: Glass blower/designer: Medical sales representative: Not assessed Social research officer, government: Not assessed ADL Comments: Not needing to perform toilet transfer due to ostomy and urinal use at the time of eval Vision   Perception    Praxis   Cognition   Sensation   Motor    Mobility     Trunk/Postural Assessment     Balance   Extremity/Trunk Assessment       Barnabas Lister 03/24/2022, 4:38 PM

## 2022-03-24 NOTE — Progress Notes (Signed)
Physical Therapy Session Note  Patient Details  Name: Richard [name] MRN: [id number] Date of Birth: 07-Nov-1955  Today's Date: 03/24/2022 PT Individual Time: 0800-0915 PT Individual Time Calculation (min): 75 min   Short Term Goals: Week 2:  PT Short Term Goal 1 (Week 2): STG=LTG due ELOS  Skilled Therapeutic Interventions/Progress Updates:      Therapy Documentation Precautions:  Precautions Precautions: None Precaution Comments: L colostomy, monitor O2/HR Restrictions Weight Bearing Restrictions: No  Pt agreeable to PT with emphasis on car transfer and stair training. Pt declines pain and transported total A for time management outside [facility] hospital for car transfer. Pt requires (S) for slide board placement and transfer to the car and for squat pivot transfer to return to w/c. Pt enthusiastic about ability to perform car transfer and asked if he could go home early. PT facilitated discussion between MD, PA, pt and his spouse regarding early discharge and awaiting decision from primary MD. Pt reported calf tightness and performed bilateral standing calf stretch 3 x 1 min on inclined red wedge with RW for stability and (S) for safety. Pt navigated 4 steps (6 inches ) with 2 HR's with (S) and PT demonstrated how to set-up and fold up w/c to patient and spouse. Pt returned to room via w/c and left seated at bedside with all needs in reach. Safety plan updated and pt's wife cleared for transfers with patient.     Therapy/Group: Individual Therapy  Verl Dicker 03/24/2022, 7:33 AM

## 2022-03-24 NOTE — Progress Notes (Incomplete)
Inpatient Rehabilitation Discharge Medication Review by a Pharmacist  A complete drug regimen review was completed for this patient to identify any potential clinically significant medication issues.  High Risk Drug Classes Is patient taking? Indication by Medication  Antipsychotic No   Anticoagulant No   Antibiotic Yes Flagyl- sigmoid perforation/continue for 4 weeks (*12/13)  Opioid No   Antiplatelet No   Hypoglycemics/insulin Yes Metformin, jardiance-T2DM  Vasoactive Medication Yes Lisinopril, metoprolol-HTN  Chemotherapy No   Other Yes nfliximab - Chron's disease Next dose due 04/17/22  Prednisone taper on '40mg'$  daily x 2 weeks, then taper by '5mg'$  per week until off (per GI)  -atorvastatin-HLD -protonix- GERD -testosterone cypionate-hypogonadism -methylphenidate- ADHD     Type of Medication Issue Identified Description of Issue Recommendation(s)  Drug Interaction(s) (clinically significant)     Duplicate Therapy     Allergy     No Medication Administration End Date     Incorrect Dose     Additional Drug Therapy Needed     Significant med changes from prior encounter (inform family/care partners about these prior to discharge).    Other       Clinically significant medication issues were identified that warrant physician communication and completion of prescribed/recommended actions by midnight of the next day:  No  Name of provider notified for urgent issues identified:   Provider Method of Notification:   Pharmacist comments:   Time spent performing this drug regimen review (minutes):  20 minutes  Sandford Craze, PharmD. [facility] Acute Care PGY-1  03/24/2022 1:10 PM

## 2022-03-24 NOTE — Progress Notes (Signed)
Patient ID: Richard [name], male   DOB: November 02, 1955, 67 y.o.   MRN: [id number]  Wife demonstrated how to empty the colostomy bag. Wife completed abdominal dressing change successfully. Wife knows to use barrier cream on the stage 2 to the left buttock.

## 2022-03-24 NOTE — Progress Notes (Signed)
PROGRESS NOTE   Subjective/Complaints:  Much better today, had success with Mg infusion being disconnected on time last night and is very happy about that--although still wants to stop it if he can.  Endorses feeling ready to go home tomorrow rather than Wednesday, PT in this morning and they also feel he is probably prepared. Will message team today to see if this is possible for tomorrow. Did well in PT today. Denies any complaints or concerns otherwise, just ready to be home. Wife here today, has some questions about colostomy care supplies.  Wt fluctuations noted, but pt denies s/s of fluid overload. Having good BMs.  ROS: Pt denies CP, SOB, edema, abd pain, N/V/D/C, and vision changes   Objective:   No results found. No results for input(s): "WBC", "HGB", "HCT", "PLT" in the last 72 hours.   No results for input(s): "NA", "K", "CL", "CO2", "GLUCOSE", "BUN", "CREATININE", "CALCIUM" in the last 72 hours.  Lab Results  Component Value Date   MG 1.6 (L) 03/24/2022   MG 1.6 (L) 03/23/2022   MG 1.6 (L) 03/22/2022      Intake/Output Summary (Last 24 hours) at 03/24/2022 0714 Last data filed at 03/24/2022 0506 Gross per 24 hour  Intake 1440 ml  Output 300 ml  Net 1140 ml      Pressure Injury 03/20/22 Buttocks Left Stage 2 -  Partial thickness loss of dermis presenting as a shallow open injury with a red, pink wound bed without slough. 0.2 x 0.3 x 0.1 pink (Active)  03/20/22 1000  Location: Buttocks  Location Orientation: Left  Staging: Stage 2 -  Partial thickness loss of dermis presenting as a shallow open injury with a red, pink wound bed without slough.  Wound Description (Comments): 0.2 x 0.3 x 0.1 pink  Present on Admission: No    Physical Exam: Vital Signs Blood pressure 125/73, pulse 78, temperature 98.4 F (36.9 C), temperature source Oral, resp. rate 16, height '5\' 9"'$  (1.753 m), weight 99.2 kg, SpO2 97  %.  General: awake, alert, appropriate, sitting up; NAD HENT: conjugate gaze; oropharynx moist CV: reg rate, regular rhythm; no JVD Pulmonary: CTA B/L; no W/R/R- good air movement GI: soft, NT, protuberant but not acutely distended, (+)BS, midline surgical wound covered but skin C/D/I around it, colostomy in LUQ with clean bag, pink stoma.  Psychiatric: appropriate Neurological: Ox3  GU- not assessed today Psych: pleasant, normal affect Skin: abdominal dressing in place- however on inspection, has great granulation- ~ 2cm in width- spot ~ 4 cm in length that has some slough, but otherwise, looks great- ~ 2-49m in depth-- dressing not removed during today's exam Neuro: 5/5 strength BUE, 4/5 BLE   Assessment/Plan: 1. Functional deficits which require 3+ hours per day of interdisciplinary therapy in a comprehensive inpatient rehab setting. Physiatrist is providing close team supervision and 24 hour management of active medical problems listed below. Physiatrist and rehab team continue to assess barriers to discharge/monitor patient progress toward functional and medical goals  Care Tool:  Bathing    Body parts bathed by patient: Right arm, Left arm, Chest, Right upper leg, Left upper leg, Face, Abdomen, Buttocks, Right lower leg, Left  lower leg, Front perineal area   Body parts bathed by helper: Right lower leg, Left lower leg, Front perineal area, Buttocks     Bathing assist Assist Level: Supervision/Verbal cueing     Upper Body Dressing/Undressing Upper body dressing   What is the patient wearing?: Pull over shirt    Upper body assist Assist Level: Independent    Lower Body Dressing/Undressing Lower body dressing      What is the patient wearing?: Pants, Incontinence brief     Lower body assist Assist for lower body dressing: Contact Guard/Touching assist     Toileting Toileting    Toileting assist Assist for toileting: Minimal Assistance - Patient > 75% (excluding  ostomy change)     Transfers Chair/bed transfer  Transfers assist     Chair/bed transfer assist level: Minimal Assistance - Patient > 75%     Locomotion Ambulation   Ambulation assist   Ambulation activity did not occur: Safety/medical concerns  Assist level: Contact Guard/Touching assist Assistive device: Walker-rolling Max distance: 177 ft   Walk 10 feet activity   Assist  Walk 10 feet activity did not occur: Safety/medical concerns  Assist level: Contact Guard/Touching assist Assistive device: Walker-rolling   Walk 50 feet activity   Assist Walk 50 feet with 2 turns activity did not occur: Safety/medical concerns  Assist level: Contact Guard/Touching assist Assistive device: Walker-rolling    Walk 150 feet activity   Assist Walk 150 feet activity did not occur: Safety/medical concerns  Assist level: Contact Guard/Touching assist Assistive device: Walker-rolling    Walk 10 feet on uneven surface  activity   Assist Walk 10 feet on uneven surfaces activity did not occur: Safety/medical concerns         Wheelchair     Assist Is the patient using a wheelchair?: Yes Type of Wheelchair: Manual    Wheelchair assist level: Supervision/Verbal cueing Max wheelchair distance: 177 ft    Wheelchair 50 feet with 2 turns activity    Assist        Assist Level: Supervision/Verbal cueing   Wheelchair 150 feet activity     Assist      Assist Level: Supervision/Verbal cueing   Blood pressure 125/73, pulse 78, temperature 98.4 F (36.9 C), temperature source Oral, resp. rate 16, height '5\' 9"'$  (1.753 m), weight 99.2 kg, SpO2 97 %.  Medical Problem List and Plan: 1. Functional deficits secondary to debility             -patient may shower             -ELOS/Goals: 10-14 days S -will also ask therapy to stop by 3:45 as well as inform them about Remicade next Wednesday for 4 hours.   -1/2- CAN HAVE SHOWER- cover the abd wound  -1/4- d/c  1/10  -Con't CIR - PT and OT-- wife doing family training -03/24/22 feels ready to go, PT Vernona Rieger agrees today; will message appropriate team members to see if d/c can occur tomorrow Mon 03/25/22-- wife will try to be here to ask questions about discharge equipment/supplies 2.  Antithrombotics: -DVT/anticoagulation:  Mechanical: Sequential compression devices, below knee Bilateral lower extremities             -antiplatelet therapy: N/A 3. Pain Management: tylenol prn 4. Mood/Behavior/Sleep: LCSW to follow for evaluation and support.              -antipsychotic agents: N/A 5. Neuropsych/cognition: This patient is capable of making decisions on his own behalf. 6. Skin/Wound  Care: Wet to dry dressing changes daily to midline.              -continue Zinc '220mg'$  QD. Add vitamin C '500mg'$  QD  -1/2- abd wound healing great will assess L buttock DTI in AM 7. Fluids/Electrolytes/Nutrition: Monitor I/O. Check CMET in am             -03/24/22 Mg 1.6, see #13 below  -Monitor weekly labs, next 03/26/22 8. Sigmoid perforation s/p resection w/abscess: Continue Duricef and Flagyl thru 12/26-- completed Duricef, continue Flagyl x4wks (started 02/27/22)  9. Rectal ulcers:  Continue short chain fatty acid enemas daily, changed timing to 4pm as per patient's preferences. Please find out from GI how long these should continue -12/28- con't Flagyl per 4 weeks (start 02/27/22) pending course of remicade.  -12/29- spoke with pharmacy- will try to arrange remicade for next Wednesday- will let therapy know since will take 4 hours -12/30- Pharmacy says doesn't need infusion monitoring 2nd time like did first time.   -1/3- have infusion nurse coming for remicade infusion today at noon -1/5- Needs f/u with Dr [name] 04/04/22 at 8:20 at [place] also need Hep B vaccines outpt -the series -03/23/21 per GI, SCFA enemas discontinued, pt thrilled 10. Crohn's disease: Remicade given 12/20 and repeat dose in 2 weeks per  GI -continue prednisone 40 mg X 2 weeks followed by 5 mg/week taper  -12/29- will start steroid taper as of 03/21/22  per GI  -03/23/22 tapered prednisone to '35mg'$  as of 03/22/22 dose-- continue; remicade infusion 03/20/22, next dose 04/03/22 11. T2DM: Hgb A1C-6.1 and BS poorly controlled due to steroids/immobility.             -will monitor BS ac/hs and titrate insulin as indicated.             -continue Levemir 14 units with  6 units novolog for meal coverage.   -12/26 Increase Levemir to 16u -12/28- will increase levemir to 18 units since still running 150s to 300s- can increase Meal coverage tomorrow.  -1/1- Cbgs 128 this AM- up to 290 with meals- will increase meal insulin to 9 units and monitor- will make changes once start to wean prednisone -1/3- increased levemir to 20 units at night- will reduce steroids as of 1/5 AM- so will improve over next few days, hopefully  -1/4- Cbgs 119 to 296- so is doing somewhat better- con't regimen -1/5- CBGs 98 to 292 overall a little better- con't regimen, but as Prednisone decreases, might need more titration on insulin -03/23/21 CBGs 200-300s, sugary intake per pt; increase Metformin to home dosing ('500mg'$  BID up from QD) and monitor -03/24/21 CBGs doing well, continue regimen and monitor   CBG (last 3)  Recent Labs    03/23/22 1634 03/23/22 2107 03/24/22 0612  GLUCAP 362* 173* 98     12. Fluid overload/Pleural effusions: Daily wts with monitoring for overload -intake improved and may need to add Low salt restrictions. Monitor daily weights -1/2- abruptly increased 3 kg between 12/31 to 1/1- will monitor- since otherwise, doesn't appear fluid overloaded-will see if can do standing weights.   -1/3- asked nursing to do standing weights- not done yet today  -1/5- weight doing well- being checked standing- con't regimen  -03/23/22 wt not done, nursing aware, will monitor -03/24/21 wt up marginally but not fluid overloaded; suspect scale variation;  monitor  Filed Weights   03/20/22 1100 03/22/22 0459 03/23/22 1601  Weight: 96.6 kg 96.3 kg 99.2 kg  13. Hypomagnesemia:  -Mg 1.3-->1.5 and till low. Will add oral supplement.             -12/23: Mag is 1.4, ordered 2grams IV supplement             -12/24: repeat magnesium ordered today -12/26 MG is 1.5, called pharmacy-recommended 4g x1 and recheck in AM, appreciate assistance  -2grams IV mag ordered 12/27.   -12/29- will give 4 G IV since Mg down to 1.4 this AM- will recheck in AM -12/30- Mg 1.5- only slightly up with 4 G IV- pt agreed to start PO Mg 400 mg BID started  -12/31- will recheck in AM- Mg 1.6 today-  -1/1- Mg 1.5- will replete again with 4G Mg- likely losing some in colostomy- con't PO Mg as well -1/2- Mg 1.5 again- will re-plete Mg again and increase Po MgOx to 800 mg BID- recheck in AM  -1/3- Mg up to 1.7! Will give 2G IV MG and con't PO Mg -1/4- Mg 1.5 again this AM- will give 4G IV Mg again- likely due to colostomy?- will check daily for now -1/5- will replete 4G IV Mg daily x 3 days- will allow weekend physician to d/c if Mg goes high, but it's been running low for weeks- on Monday will reassess repletion -03/23/22 Mg 1.6, pt frustrated by IV Mg disconnection timing, will adjust timing of dose today to see if this helps with schedule issue; spoke with pharmacy, MgOx dosing is max currently ('800mg'$  BID), and would provide more elemental Mag than Mg Gluconate so continue this regimen for now would make more sense. Will reassess tomorrow -03/24/22 Mg 1.6 still, pt wants to d/c IV Mg which I think is reasonable-- continue PO mag, will leave tomorrow's check on to verify no drop in level  14. Severe OSA: Discussed need for CPAP/oxygen does not treat underling issues             -declines CPAP "too much at this time/can't lie on the left"  -1/6- continues to decline CPAP.  15. Tachycardic: EKG ordered and shows sinus tachycardia. May be 2/2 steroids, check magnesium level  today. Discussed magnesium can help with tachycardia  -HR around 100, improved   -1/1- HR running 90s-low 100s- con't regimen  -1/4- HR back in low 100's this AM- con't regimen  -1/5- HR in 90s this AM  -03/24/22 HR stable, continue to monitor   I spent a total of 35  minutes on total care today- >50% coordination of care- due to d/w PT and family regarding hopeful discharge date for tomorrow.     LOS: 16 days A FACE TO [place] 03/24/2022, 7:14 AM

## 2022-03-24 NOTE — Progress Notes (Signed)
Pt refuses cpap

## 2022-03-25 ENCOUNTER — Telehealth: Payer: Self-pay

## 2022-03-25 LAB — BASIC METABOLIC PANEL
Anion gap: 8 (ref 5–15)
BUN: 8 mg/dL (ref 8–23)
CO2: 29 mmol/L (ref 22–32)
Calcium: 8.9 mg/dL (ref 8.9–10.3)
Chloride: 100 mmol/L (ref 98–111)
Creatinine, Ser: 0.57 mg/dL — ABNORMAL LOW (ref 0.61–1.24)
GFR, Estimated: >60 mL/min (ref >60)
Glucose, Bld: 168 mg/dL — ABNORMAL HIGH (ref 70–99)
Potassium: 3.8 mmol/L (ref 3.5–5.1)
Sodium: 137 mmol/L (ref 135–145)

## 2022-03-25 LAB — GLUCOSE, CAPILLARY
Glucose-Capillary: 102 mg/dL — ABNORMAL HIGH (ref 70–99)
Glucose-Capillary: 124 mg/dL — ABNORMAL HIGH (ref 70–99)

## 2022-03-25 LAB — CBC
HCT: 35.5 % — ABNORMAL LOW (ref 39.0–52.0)
Hemoglobin: 11.1 g/dL — ABNORMAL LOW (ref 13.0–17.0)
MCH: 32.4 pg (ref 26.0–34.0)
MCHC: 31.3 g/dL (ref 30.0–36.0)
MCV: 103.5 fL — ABNORMAL HIGH (ref 80.0–100.0)
Platelets: 195 10*3/uL (ref 150–400)
RBC: 3.43 MIL/uL — ABNORMAL LOW (ref 4.22–5.81)
RDW: 16.9 % — ABNORMAL HIGH (ref 11.5–15.5)
WBC: 9.8 10*3/uL (ref 4.0–10.5)
nRBC: 0 % (ref 0.0–0.2)

## 2022-03-25 LAB — MAGNESIUM: Magnesium: 1.5 mg/dL — ABNORMAL LOW (ref 1.7–2.4)

## 2022-03-25 MED ORDER — METFORMIN HCL 500 MG PO TABS: 1000.0000 mg | ORAL_TABLET | Freq: Two times a day (BID) | ORAL | Status: DC

## 2022-03-25 MED ORDER — POLYETHYLENE GLYCOL 3350 17 G PO PACK: 17.0000 g | PACK | Freq: Every day | ORAL | 0 refills | Status: DC

## 2022-03-25 MED ORDER — INSULIN DEGLUDEC 100 UNIT/ML ~~LOC~~ SOPN: 20.0000 [IU] | PEN_INJECTOR | Freq: Every day | SUBCUTANEOUS | 0 refills | Status: DC

## 2022-03-25 MED ORDER — PROCHLORPERAZINE MALEATE 5 MG PO TABS: 5.0000 mg | ORAL_TABLET | Freq: Four times a day (QID) | ORAL | 0 refills | Status: DC | PRN

## 2022-03-25 MED ORDER — METFORMIN HCL 1000 MG PO TABS: 1000.0000 mg | ORAL_TABLET | Freq: Two times a day (BID) | ORAL | 0 refills | Status: DC

## 2022-03-25 MED ORDER — ACCU-CHEK GUIDE VI STRP: 1.0000 | ORAL_STRIP | Freq: Three times a day (TID) | 0 refills | Status: DC

## 2022-03-25 MED ORDER — METRONIDAZOLE 500 MG PO TABS: 500.0000 mg | ORAL_TABLET | Freq: Two times a day (BID) | ORAL | 0 refills | Status: DC

## 2022-03-25 MED ORDER — ZINC SULFATE 220 (50 ZN) MG PO CAPS: 220.0000 mg | ORAL_CAPSULE | Freq: Every day | ORAL | 0 refills | Status: AC

## 2022-03-25 MED ORDER — MAGNESIUM OXIDE -MG SUPPLEMENT 400 (240 MG) MG PO TABS: 800.0000 mg | ORAL_TABLET | Freq: Three times a day (TID) | ORAL | 0 refills | Status: DC

## 2022-03-25 MED ORDER — AURORA PEN NEEDLES 31G X 6 MM MISC: 1.0000 | Freq: Every day | 0 refills | Status: DC

## 2022-03-25 MED ORDER — DOCUSATE SODIUM 100 MG PO CAPS: 100.0000 mg | ORAL_CAPSULE | Freq: Every day | ORAL | 0 refills | Status: DC

## 2022-03-25 MED ORDER — HYDROCORTISONE 0.5 % EX CREA: TOPICAL_CREAM | Freq: Two times a day (BID) | CUTANEOUS | 0 refills | Status: DC | PRN

## 2022-03-25 MED ORDER — PANTOPRAZOLE SODIUM 40 MG PO TBEC: 40.0000 mg | DELAYED_RELEASE_TABLET | Freq: Every day | ORAL | 0 refills | Status: DC

## 2022-03-25 MED ORDER — PREDNISONE 10 MG PO TABS: ORAL_TABLET | ORAL | 0 refills | Status: DC

## 2022-03-25 MED ORDER — MAGNESIUM OXIDE -MG SUPPLEMENT 400 (240 MG) MG PO TABS: 800.0000 mg | ORAL_TABLET | Freq: Three times a day (TID) | ORAL | Status: DC

## 2022-03-25 NOTE — Progress Notes (Signed)
Inpatient Rehabilitation Care Coordinator Discharge Note   Patient Details  Name: Richard [name] MRN: [id number] Date of Birth: 31-Aug-1955   Discharge location: HOME WITH WIFE WHO HAS BEEN HERE DAILY AND LEARNED HIS CARE  Length of Stay: 17 DAYS  Discharge activity level: SUPERVISION-CGA SOME MIN ASSSIT  Home/community participation: ACTIVE  Patient response [place]:SHUOHF Literacy - How often do you need to have someone help you when you read instructions, pamphlets, or other written material from your doctor or pharmacy?: Never  Patient response GB:MSXJDB Isolation - How often do you feel lonely or isolated from those around you?: Never  Services provided included: MD, RD, PT, OT, RN, CM, TR, Pharmacy, Neuropsych, SW  Financial Services:  Charity fundraiser Utilized: Publishing copy offered to/list presented to: PT AND WIFE  Follow-up services arranged:  Home Health, DME, Patient/Family has no preference for HH/DME agencies [place]: [facility]    DME : [facility]    Patient response to transportation need: Is the patient able to respond to transportation needs?: Yes In the past 12 months, has lack of transportation kept you from medical appointments or from getting medications?: No In the past 12 months, has lack of transportation kept you from meetings, work, or from getting things needed for daily living?: No    Comments (or additional information): PT REACHED HIS GOALS SOONER AND WANTED TO LEAVE TWO DAYS EARLY. MD AND TEAM FELT READY. WIFE COMFORTABLE WITH HIS CARE.  Patient/Family verbalized understanding of follow-up arrangements:  Yes  Individual responsible for coordination of the follow-up plan: JILL-WIFE [phone]  Confirmed correct DME delivered: Elease Hashimoto 03/25/2022    Elease Hashimoto

## 2022-03-25 NOTE — Telephone Encounter (Signed)
Please see message below

## 2022-03-25 NOTE — Discharge Summary (Signed)
Physician Discharge Summary  Patient ID: Richard [name] MRN: [id number] DOB/AGE: [birth year] 67 y.o.  Admit date: 03/08/2022 Discharge date: 03/25/2022  Discharge Diagnoses:  Principal Problem:   Debility Active Problems:   SEVERE OBSTRUCTIVE SLEEP APNEA with AHI 94   Type 2 diabetes mellitus with insulin therapy ([place])   Crohn's disease of large intestine with other complication ([place])   GI bleed   Depressive reaction   Hypomagnesemia   Discharged Condition: stable  Significant Diagnostic Studies: N/A   Labs:  Basic Metabolic Panel: Recent Labs  Lab 03/19/22 0452 03/20/22 0316 03/21/22 0303 03/22/22 0439 03/23/22 0430 03/24/22 0426 03/25/22 0414 03/25/22 0900  NA 136  --   --   --   --   --   --  137  K 3.8  --   --   --   --   --   --  3.8  CL 97*  --   --   --   --   --   --  100  CO2 29  --   --   --   --   --   --  29  GLUCOSE 173*  --   --   --   --   --   --  168*  BUN 15  --   --   --   --   --   --  8  CREATININE 0.35*  --   --   --   --   --   --  0.57*  CALCIUM 8.8*  --   --   --   --   --   --  8.9  MG 1.5* 1.7 1.5* 1.6* 1.6* 1.6* 1.5*  --     CBC:    Latest Ref Rng & Units 03/25/2022    9:00 AM 03/19/2022    4:52 AM 03/12/2022    4:56 AM  CBC  WBC 4.0 - 10.5 K/uL 9.8  8.8  8.7   Hemoglobin 13.0 - 17.0 g/dL 11.1  10.1  9.6   Hematocrit 39.0 - 52.0 % 35.5  31.3  30.9   Platelets 150 - 400 K/uL 195  158  308      CBG: Recent Labs  Lab 03/24/22 1143 03/24/22 1626 03/24/22 2058 03/24/22 2231 03/25/22 0608  GLUCAP 128* 268* 232* 228* 102*    Brief HPI:   Richard [name] is a 67 y.o. male with history of T2DM, severe OSA, HTN, recent diagnosis of IBD who was originally admitted to [facility] on 02/07/2022 with perforated bowel with peritonitis requiring emergent exploratory lap with resection of descending and sigmoid colon with transverse colectomy by Dr. [name].  He had complicated and prolonged hospital course with encephalopathy due to PNA, rectal  bleeding due to rectal varices as well as multiple rectal ulcers requiring hemostatic spray, LLQ abscess positive for E. coli and Bacteroides and placed on Zosyn and Flagyl per ID input.  He was transferred to [facility] on 12/15 for intensive rehab program to consist of 3 hours of PT OT.  He had recurrent issues with fluid overload requiring IV diuresis as well as recurrent rectal bleeding requiring flex sig with distal treatment of colon with Uristat.  He had recurrent bleeding later that night and was transferred to acute hospital for management.  He was started on trial of short chain fatty enemas daily and Remicade administered 12/20 after discussion of risk versus benefits.  2D echo done 12/19 and showed  EF 60 to 65% with no wall abnormality and revealed pericardial effusion.  CT abdomen/pelvis showed resolution of abscess therefore drain was pulled on 12/20 and he was transition to [facility] and Flagyl with recommendations to continue through 12/26.  He has had issues with significant hypomagnesemia felt to be due to poor p.o. intake and required IV supplementation.  He was weaned off oxygen and started on slow prednisone taper.  PT OT reevaluations done showing patient to continue to have ongoing issues with weakness and debility.  CIR was recommended due to functional decline.     Hospital Course: Richard [name] was admitted to rehab 03/08/2022 for inpatient therapies to consist of PT and OT at least three hours five days a week. Past admission physiatrist, therapy team and rehab RN have worked together to provide customized collaborative inpatient rehab.  His blood pressures were monitored on TID basis and has been stable. His diabetes has been monitored with ac/hs CBG checks and SSI was use prn for tighter BS control.  He was maintained on insulin glargine with meal coverage for tighter blood sugar control.  He continued to have high blood sugars therefore metformin was added and titrated up to 1000 mg twice  daily with discontinuation of meal coverage at discharge. his diet was during hospitalization liberalized to help with food choices and intake. Patient and wife were educated on monitoring carbs/sweet intake and checking BS ac/hs for now till follow up with PCP for further titration of medications.   He has refused to wear his CPAP during hospitalization despite education  and reports that he plans to comply with this at home.  As rectal bleeding has resolved GI recommended discontinuation of short chain fatty enemas.  Follow-up CBC shows H&H to be slowly improving and leukocytosis resolved.  He received second dose of Remicade 500 mg on 01/03 per GI input. He continues on flagyl for 12 additional days to complete 4 week course of treatment as recommended.  He required multiple IV supplementation due to ongoing issues with hypomagnesemia.  Oral supplement added and titrated upwards as his GI tolerance improved.  Recommend repeat check of magnesium level in a week to monitor for improvement/stability.   He is tolerating steroid wean without SE.  He has been instructed on up titration of laxatives to help loosen stool consistency.  Midline abdominal wound is healing well without any signs or symptoms of infection.  He has made steady gains during his rehab stay and supervision is recommended at discharge.  He will continue to receive follow-up home health PT, OT and HHRn by [facility] home [facility] after discharge   Rehab course: During patient's stay in rehab weekly team conferences were held to monitor patient's progress, set goals and discuss barriers to discharge. At admission, patient required max assist with basic ADL tasks and mod assist with mobility. He  has had improvement in activity tolerance, balance, postural control as well as ability to compensate for deficits.  He requires min assist with ADL tasks. He requires supervision w/cues for tranfers and to ambulate 117 with use of RW. He is able to climb 4  stairs with rails and supervision Family education has been completed.   Disposition: Home   Diet: Carb modified  Special Instructions: Recheck Mg level in 5-7 days.  2. Continue wet to dry dressing daily to midline abdominal wound 3. Increase protein intake 4. Monitor BS ac/hs.    Allergies as of 03/25/2022       Reactions  Lobster [shellfish Allergy] Nausea Only   Poison Ivy Extract Itching        Medication List     STOP taking these medications    cefTRIAXone 1 g injection Commonly known as: ROCEPHIN   clotrimazole-betamethasone cream Commonly known as: LOTRISONE   diphenoxylate-atropine 2.5-0.025 MG tablet Commonly known as: Lomotil   feeding supplement Liqd   ferrous sulfate 325 (65 FE) MG tablet   hydrOXYzine 25 MG tablet Commonly known as: ATARAX   insulin aspart 100 UNIT/ML injection Commonly known as: novoLOG   Jardiance 10 MG Tabs tablet Generic drug: empagliflozin   lisinopril 10 MG tablet Commonly known as: ZESTRIL   methylphenidate 20 MG CR capsule Commonly known as: METADATE CD   methylPREDNISolone sodium succinate 40 mg/mL injection Commonly known as: SOLU-MEDROL   metoCLOPramide 5 MG tablet Commonly known as: REGLAN   metoprolol tartrate 25 MG tablet Commonly known as: LOPRESSOR   metroNIDAZOLE 500 MG/100ML Commonly known as: FLAGYL Replaced by: metroNIDAZOLE 500 MG tablet   ondansetron 4 MG/2ML Soln injection Commonly known as: ZOFRAN   polycarbophil 625 MG tablet Commonly known as: FIBERCON   SYRINGE-NEEDLE (DISP) 3 ML 22G X 1-1/2" 3 ML Misc   Syringe/Needle (Disp) 18G X 1" 3 ML Misc   testosterone cypionate 200 MG/ML injection Commonly known as: DEPOTESTOSTERONE CYPIONATE       TAKE these medications    Accu-Chek Guide test strip Generic drug: glucose blood 1 each by Other route 4 (four) times daily -  before meals and at bedtime. What changed:  how much to take how to take this when to take  this additional instructions   Accu-Chek Softclix Lancets lancets SMARTSIG:Topical   acetaminophen 325 MG tablet Commonly known as: TYLENOL Take 2 tablets (650 mg total) by mouth every 4 (four) hours as needed for mild pain, moderate pain, fever or headache. What changed: Another medication with the same name was removed. Continue taking this medication, and follow the directions you see here.   alum & mag hydroxide-simeth 200-200-20 MG/5ML suspension Commonly known as: MAALOX/MYLANTA Take 30 mLs by mouth every 6 (six) hours as needed for indigestion or heartburn (or bloating).   ascorbic acid 500 MG tablet Commonly known as: VITAMIN C Take 1 tablet (500 mg total) by mouth 2 (two) times daily.   atorvastatin 40 MG tablet Commonly known as: LIPITOR TAKE 1 TABLET BY MOUTH EVERY DAY   [place] Pen Needles 31G X 6 MM Misc Generic drug: Insulin Pen Needle 1 Application by Does not apply route at bedtime.   diphenhydrAMINE 25 mg capsule Commonly known as: BENADRYL Take 25 mg by mouth daily as needed for allergies.   hydrocortisone cream 0.5 % Apply topically 2 (two) times daily as needed for itching.   insulin degludec 100 UNIT/ML FlexTouch Pen Commonly known as: TRESIBA Inject 20 Units into the skin at bedtime.   magnesium oxide 400 (240 Mg) MG tablet Commonly known as: MAG-OX Take 2 tablets (800 mg total) by mouth 3 (three) times daily.   metFORMIN 1000 MG tablet Commonly known as: GLUCOPHAGE Take 1 tablet (1,000 mg total) by mouth 2 (two) times daily with a meal. What changed:  medication strength how much to take   metroNIDAZOLE 500 MG tablet Commonly known as: FLAGYL Take 1 tablet (500 mg total) by mouth every 12 (twelve) hours. Replaces: metroNIDAZOLE 500 MG/100ML   multivitamin with minerals Tabs tablet Take 1 tablet by mouth daily.   naphazoline-glycerin 0.012-0.25 % Soln Commonly known as: CLEAR  EYES REDNESS Place 1-2 drops into both eyes 4 (four) times  daily as needed for eye irritation.   pantoprazole 40 MG tablet Commonly known as: PROTONIX Take 1 tablet (40 mg total) by mouth daily.   polyethylene glycol 17 g packet Commonly known as: MiraLax Take 17 g by mouth daily.   predniSONE 10 MG tablet Commonly known as: DELTASONE Take 3.5 tabs till Thursday. Starting 01/12 decrease to 3 tabs daily for a week. Decrease by 5 mg weekly till gone. Notes to patient: Take 3.5 tabs daily till Thursday. Starting 03/29/21- take 3 pills per day till 01/18. Starting 01/19 take 2.5 tabs per day till 01/25. Starting 01/26 decrease to 2 tabs per day till 02/01. Starting 02/02 decrease 1.5 tabs per day till 02/08.   prochlorperazine 5 MG tablet Commonly known as: COMPAZINE Take 1-2 tablets (5-10 mg total) by mouth every 6 (six) hours as needed for nausea.   simethicone 40 MG/0.6ML drops Commonly known as: MYLICON Take 1.2 mLs (80 mg total) by mouth 4 (four) times daily as needed for flatulence (PRN bloating, gassiness).   thiamine 100 MG tablet Commonly known as: Vitamin B-1 Take 1 tablet (100 mg total) by mouth daily.   zinc sulfate 220 (50 Zn) MG capsule Take 1 capsule (220 mg total) by mouth daily.        Follow-up Information     Meredith Staggers, MD Follow up.   Specialty: Physical Medicine and Rehabilitation Why: Call in 1-2 days for post hospital follow up Contact information: [address] [place] [address] [phone]         Mignon Pine, DO Follow up.   Specialties: Infectious Diseases, Internal Medicine Why: Call in 1-2 days for post hospital follow up Contact information: [place] [address] [phone]         Leighton Ruff, MD Follow up.   Specialties: General Surgery, Colon and Rectal Surgery Why: Call in 1-2 days for post hospital follow up Contact information: [place] [address] [phone]                  Signed: Bary Leriche 03/27/2022, 4:01 PM

## 2022-03-25 NOTE — Telephone Encounter (Signed)
Yes, okay to see me at 4:00 on Friday for hospital follow-up.

## 2022-03-25 NOTE — Telephone Encounter (Signed)
Patient wife called to schedule lab appt for patient on Friday 1/12.  She states patient was discharged from hospital after 7 weeks. Hospital discharge notes state patient to have labs done on Friday. (Nothing about following up with PCP) With that being said, the only time patient could make an appt is at 4pm.on Friday 1/12 Is this okay?  Do I need to change OC to hospital follow up?

## 2022-03-25 NOTE — Progress Notes (Signed)
Patient ID: Richard [name], male   DOB: [birth year], 67 y.o.   MRN: [id number] Pt requesting to discharge today, team and MD on board. Checking on wheelchair and will alert [facility] regarding earlier discharge.

## 2022-03-25 NOTE — Progress Notes (Signed)
Pt stable on room air at this time refuses to wear CPAP at hospital but states he does comply at home. No active distress noted. O2 stat 98%. Will continue to monitor

## 2022-03-25 NOTE — Progress Notes (Signed)
Inpatient Rehabilitation Discharge Medication Review by a Pharmacist  A complete drug regimen review was completed for this patient to identify any potential clinically significant medication issues.  High Risk Drug Classes Is patient taking? Indication by Medication  Antipsychotic Yes Prochlorperazine - PRN nausea/vomiting  Anticoagulant No   Antibiotic Yes Metronidazole - bowel perforation infection  Opioid No   Antiplatelet No   Hypoglycemics/insulin Yes Metformin, insulin degludec -T2DM  Vasoactive Medication Yes Lisinopril, metoprolol-HTN  Chemotherapy No   Other Yes Atorvastatin - HLD Diphenhydramine - PRN allergies Hydrocortisone cream - PRN itching Lomotil - PRN diarrhea  Maalox - PRN indigestion/heartburn Mag oxide, zinc sulfate, Vit C, MVI, thiamine - supplement Naphazolin/glycerin opth - PRN dry eyes Pantoprazole - GERD Prednisone, infliximab - Crohn's disease PEG - constipation Simethicone - PRN flatulence     Type of Medication Issue Identified Description of Issue Recommendation(s)  Drug Interaction(s) (clinically significant)     Duplicate Therapy     Allergy     No Medication Administration End Date     Incorrect Dose     Additional Drug Therapy Needed     Significant med changes from prior encounter (inform family/care partners about these prior to discharge). Medications discontinued: Lotrisone, empagliflozin, lisinopril, methylphenidate, testosterone  Communicate medication changes with patient/family at discharge  Other       Clinically significant medication issues were identified that warrant physician communication and completion of prescribed/recommended actions by midnight of the next day:  No   Pharmacist comments:  Per GI recommendations, start prednisone taper on 03/21/22, decreasing by '5mg'$  per week until off Per GI recommendations, continue Flagyl for 4 weeks - sigmoid perforation/continue for 4 weeks (started 12/13)   Time spent  performing this drug regimen review (minutes): 20   Thank you for allowing pharmacy to be a part of this patient's care.  Ardyth Harps, PharmD Clinical Pharmacist

## 2022-03-25 NOTE — Progress Notes (Signed)
Pt's wife demonstrated dressing change. Pt tolerated well. No further questions at this time.   Gerald Stabs, RN

## 2022-03-25 NOTE — Progress Notes (Addendum)
Per email patient to discharge today. Have ostomy supplies for home discharge. (4 pouches, 4 wafers, and 4 barrier rings)  Nursing care plan and nursing education completed for discharge to home.

## 2022-03-25 NOTE — Progress Notes (Signed)
PROGRESS NOTE   Subjective/Complaints:  Stool clumping more- explained increase in should help make looser- if not, will need to increase oral bowel meds- over the counter.   PT/OT told him he met his goals, so might be able to leave early- pt asking to leave early as a result.     ROS:  Pt denies SOB, abd pain, CP, N/V/C/D, and vision changes   Objective:   No results found. No results for input(s): "WBC", "HGB", "HCT", "PLT" in the last 72 hours.   No results for input(s): "NA", "K", "CL", "CO2", "GLUCOSE", "BUN", "CREATININE", "CALCIUM" in the last 72 hours.  Lab Results  Component Value Date   MG 1.5 (L) 03/25/2022   MG 1.6 (L) 03/24/2022   MG 1.6 (L) 03/23/2022      Intake/Output Summary (Last 24 hours) at 03/25/2022 0856 Last data filed at 03/25/2022 0735 Gross per 24 hour  Intake 1680 ml  Output 2400 ml  Net -720 ml     Pressure Injury 03/20/22 Buttocks Left Stage 2 -  Partial thickness loss of dermis presenting as a shallow open injury with a red, pink wound bed without slough. 0.2 x 0.3 x 0.1 pink (Active)  03/20/22 1000  Location: Buttocks  Location Orientation: Left  Staging: Stage 2 -  Partial thickness loss of dermis presenting as a shallow open injury with a red, pink wound bed without slough.  Wound Description (Comments): 0.2 x 0.3 x 0.1 pink  Present on Admission: No    Physical Exam: Vital Signs Blood pressure 129/75, pulse 74, temperature 98.6 F (37 C), temperature source Oral, resp. rate 18, height '5\' 9"'$  (1.753 m), weight 99.2 kg, SpO2 97 %.    General: awake, alert, appropriate, wife at bedside; NAD HENT: conjugate gaze; oropharynx moist CV: tachycardic/mild- in 100s; rate; no JVD Pulmonary: CTA B/L; no W/R/R- good air movement GI: soft, NT, ND, (+)BS- colostomy has clumpy stools Psychiatric: appropriate Neurological: Ox3  GU- not assessed today Psych: pleasant, normal  affect Skin: abdominal dressing in place- however on inspection, has great granulation- ~ 2cm in width- spot ~ 4 cm in length that has some slough, but otherwise, looks great- ~ 2-46m in depth-- dressing not removed during today's exam Neuro: 5/5 strength BUE, 4/5 BLE   Assessment/Plan: 1. Functional deficits which require 3+ hours per day of interdisciplinary therapy in a comprehensive inpatient rehab setting. Physiatrist is providing close team supervision and 24 hour management of active medical problems listed below. Physiatrist and rehab team continue to assess barriers to discharge/monitor patient progress toward functional and medical goals  Care Tool:  Bathing    Body parts bathed by patient: Right arm, Left arm, Chest, Right upper leg, Left upper leg, Face, Abdomen, Buttocks, Right lower leg, Left lower leg, Front perineal area   Body parts bathed by helper: Right lower leg, Left lower leg, Front perineal area, Buttocks     Bathing assist Assist Level: Supervision/Verbal cueing     Upper Body Dressing/Undressing Upper body dressing   What is the patient wearing?: Pull over shirt    Upper body assist Assist Level: Independent    Lower Body Dressing/Undressing Lower body dressing  What is the patient wearing?: Pants, Incontinence brief     Lower body assist Assist for lower body dressing: Contact Guard/Touching assist     Toileting Toileting    Toileting assist Assist for toileting: Minimal Assistance - Patient > 75% (excluding ostomy change)     Transfers Chair/bed transfer  Transfers assist     Chair/bed transfer assist level: Minimal Assistance - Patient > 75%     Locomotion Ambulation   Ambulation assist   Ambulation activity did not occur: Safety/medical concerns  Assist level: Contact Guard/Touching assist Assistive device: Walker-rolling Max distance: 177 ft   Walk 10 feet activity   Assist  Walk 10 feet activity did not occur:  Safety/medical concerns  Assist level: Contact Guard/Touching assist Assistive device: Walker-rolling   Walk 50 feet activity   Assist Walk 50 feet with 2 turns activity did not occur: Safety/medical concerns  Assist level: Contact Guard/Touching assist Assistive device: Walker-rolling    Walk 150 feet activity   Assist Walk 150 feet activity did not occur: Safety/medical concerns  Assist level: Contact Guard/Touching assist Assistive device: Walker-rolling    Walk 10 feet on uneven surface  activity   Assist Walk 10 feet on uneven surfaces activity did not occur: Safety/medical concerns         Wheelchair     Assist Is the patient using a wheelchair?: Yes Type of Wheelchair: Manual    Wheelchair assist level: Supervision/Verbal cueing Max wheelchair distance: 177 ft    Wheelchair 50 feet with 2 turns activity    Assist        Assist Level: Supervision/Verbal cueing   Wheelchair 150 feet activity     Assist      Assist Level: Supervision/Verbal cueing   Blood pressure 129/75, pulse 74, temperature 98.6 F (37 C), temperature source Oral, resp. rate 18, height '5\' 9"'$  (1.753 m), weight 99.2 kg, SpO2 97 %.  Medical Problem List and Plan: 1. Functional deficits secondary to debility             -patient may shower             -ELOS/Goals: 10-14 days S -will also ask therapy to stop by 3:45 as well as inform them about Remicade next Wednesday for 4 hours.   -1/2- CAN HAVE SHOWER- cover the abd wound  -1/4- d/c 1/10  -Con't CIR - PT and OT-- wife doing family training -03/24/22 feels ready to go, PT Vernona Rieger agrees today; will message appropriate team members to see if d/c can occur tomorrow Mon 03/25/22-- wife will try to be here to ask questions about discharge equipment/supplies  1/8- will d/c today- d/w SW and PA- will arrange and get PICC out 2.  Antithrombotics: -DVT/anticoagulation:  Mechanical: Sequential compression devices, below knee  Bilateral lower extremities             -antiplatelet therapy: N/A 3. Pain Management: tylenol prn 4. Mood/Behavior/Sleep: LCSW to follow for evaluation and support.              -antipsychotic agents: N/A 5. Neuropsych/cognition: This patient is capable of making decisions on his own behalf. 6. Skin/Wound Care: Wet to dry dressing changes daily to midline.              -continue Zinc '220mg'$  QD. Add vitamin C '500mg'$  QD  -1/2- abd wound healing great will assess L buttock DTI in AM 7. Fluids/Electrolytes/Nutrition: Monitor I/O. Check CMET in am             -  03/24/22 Mg 1.6, see #13 below  -Monitor weekly labs, next 03/26/22 8. Sigmoid perforation s/p resection w/abscess: Continue Duricef and Flagyl thru 12/26-- completed Duricef, continue Flagyl x4wks (started 02/27/22)  9. Rectal ulcers:  Continue short chain fatty acid enemas daily, changed timing to 4pm as per patient's preferences. Please find out from GI how long these should continue -12/28- con't Flagyl per 4 weeks (start 02/27/22) pending course of remicade.  -12/29- spoke with pharmacy- will try to arrange remicade for next Wednesday- will let therapy know since will take 4 hours -12/30- Pharmacy says doesn't need infusion monitoring 2nd time like did first time.   -1/3- have infusion nurse coming for remicade infusion today at noon -1/5- Needs f/u with Dr [name] 04/04/22 at 8:20 at [facility] also need Hep B vaccines outpt -the series -03/23/21 per GI, SCFA enemas discontinued, pt thrilled 10. Crohn's disease: Remicade given 12/20 and repeat dose in 2 weeks per GI -continue prednisone 40 mg X 2 weeks followed by 5 mg/week taper  -12/29- will start steroid taper as of 03/21/22  per GI  -03/23/22 tapered prednisone to '35mg'$  as of 03/22/22 dose-- continue; remicade infusion 03/20/22, next dose 04/03/22 11. T2DM: Hgb A1C-6.1 and BS poorly controlled due to steroids/immobility.             -will monitor BS ac/hs and titrate insulin as  indicated.             -continue Levemir 14 units with  6 units novolog for meal coverage.   -12/26 Increase Levemir to 16u -12/28- will increase levemir to 18 units since still running 150s to 300s- can increase Meal coverage tomorrow.  -1/1- Cbgs 128 this AM- up to 290 with meals- will increase meal insulin to 9 units and monitor- will make changes once start to wean prednisone -1/3- increased levemir to 20 units at night- will reduce steroids as of 1/5 AM- so will improve over next few days, hopefully  -1/4- Cbgs 119 to 296- so is doing somewhat better- con't regimen -1/5- CBGs 98 to 292 overall a little better- con't regimen, but as Prednisone decreases, might need more titration on insulin -03/23/21 CBGs 200-300s, sugary intake per pt; increase Metformin to home dosing ('500mg'$  BID up from QD) and monitor -03/24/21 CBGs doing well, continue regimen and monitor 1/8- Cbgs doing better- con't taper and send home on current Insulin regimen   CBG (last 3)  Recent Labs    03/24/22 2058 03/24/22 2231 03/25/22 0608  GLUCAP 232* 228* 102*    12. Fluid overload/Pleural effusions: Daily wts with monitoring for overload -intake improved and may need to add Low salt restrictions. Monitor daily weights -1/2- abruptly increased 3 kg between 12/31 to 1/1- will monitor- since otherwise, doesn't appear fluid overloaded-will see if can do standing weights.   -1/3- asked nursing to do standing weights- not done yet today  -1/5- weight doing well- being checked standing- con't regimen  -03/23/22 wt not done, nursing aware, will monitor -03/24/21 wt up marginally but not fluid overloaded; suspect scale variation; monitor  1/8- pt will need to d/c today Filed Weights   03/20/22 1100 03/22/22 0459 03/23/22 1601  Weight: 96.6 kg 96.3 kg 99.2 kg     13. Hypomagnesemia:  -Mg 1.3-->1.5 and till low. Will add oral supplement.             -12/23: Mag is 1.4, ordered 2grams IV supplement             -  12/24: repeat  magnesium ordered today -12/26 MG is 1.5, called pharmacy-recommended 4g x1 and recheck in AM, appreciate assistance  -2grams IV mag ordered 12/27.   -12/29- will give 4 G IV since Mg down to 1.4 this AM- will recheck in AM -12/30- Mg 1.5- only slightly up with 4 G IV- pt agreed to start PO Mg 400 mg BID started  -12/31- will recheck in AM- Mg 1.6 today-  -1/1- Mg 1.5- will replete again with 4G Mg- likely losing some in colostomy- con't PO Mg as well -1/2- Mg 1.5 again- will re-plete Mg again and increase Po MgOx to 800 mg BID- recheck in AM  -1/3- Mg up to 1.7! Will give 2G IV MG and con't PO Mg -1/4- Mg 1.5 again this AM- will give 4G IV Mg again- likely due to colostomy?- will check daily for now -1/5- will replete 4G IV Mg daily x 3 days- will allow weekend physician to d/c if Mg goes high, but it's been running low for weeks- on Monday will reassess repletion -03/23/22 Mg 1.6, pt frustrated by IV Mg disconnection timing, will adjust timing of dose today to see if this helps with schedule issue; spoke with pharmacy, MgOx dosing is max currently ('800mg'$  BID), and would provide more elemental Mag than Mg Gluconate so continue this regimen for now would make more sense. Will reassess tomorrow -03/24/22 Mg 1.6 still, pt wants to d/c IV Mg which I think is reasonable-- continue PO mag, will leave tomorrow's check on to verify no drop in level  1/8- Will increase PO Mg to 800 mg TID-  and needs ot have PCP recheck in next 4-5 days. 14. Severe OSA: Discussed need for CPAP/oxygen does not treat underling issues             -declines CPAP "too much at this time/can't lie on the left"  -1/6- continues to decline CPAP.  15. Tachycardic: EKG ordered and shows sinus tachycardia. May be 2/2 steroids, check magnesium level today. Discussed magnesium can help with tachycardia  -HR around 100, improved   -1/1- HR running 90s-low 100s- con't regimen  -1/4- HR back in low 100's this AM- con't regimen  -1/5- HR in  90s this AM  -03/24/22 HR stable, continue to monitor   I spent a total of 46   minutes on total care today- >50% coordination of care- due to d/w SW, PA, therapy, and prolonged time with pt and wife.     LOS: 17 days A FACE TO FACE EVALUATION WAS PERFORMED  Jasmyn Picha 03/25/2022, 8:56 AM

## 2022-03-25 NOTE — Telephone Encounter (Signed)
Please Advise

## 2022-03-25 NOTE — Progress Notes (Signed)
Physical Therapy Discharge Summary  Patient Details  Name: Richard [name] MRN: [id number] Date of Birth: [birth year]  Date of Discharge from PT service:March 25, 2022  Today's Date: 03/25/2022 PT Missed Time: 25 Minutes Missed Time Reason: Unavailable (Comment) (pt discharge updated to today)   Patient has met 12 of 12 long term goals due to improved activity tolerance, improved balance, improved postural control, increased strength, increased range of motion, decreased pain, ability to compensate for deficits, and improved coordination.  Patient to discharge at  household ambulatory and supervision w/c  level .   Patient's spouse present and demonstrates competency with supervisory assistance with functional mobility. Pt largely requires supervision for bed mobility with rail and pt has purchased bed rail to utilize upon discharge. Pt requires (S) for transfers and gait with RW.   Reasons goals not met: N/A   Recommendation:  Patient will benefit from ongoing skilled PT services in home health setting to continue to advance safe functional mobility, address ongoing impairments in activity tolerance, balance, gait, and minimize fall risk.  Equipment: Slideboard, manual w/c, RW   Reasons for discharge: treatment goals met and discharge from hospital  Patient/family agrees with progress made and goals achieved: Yes  PT Discharge Pain Interference Pain Interference Pain Effect on Sleep: 0. Does not apply - I have not had any pain or hurting in the past 5 days Pain Interference with Therapy Activities: 0. Does not apply - I have not received rehabilitationtherapy in the past 5 days Pain Interference with Day-to-Day Activities: 1. Rarely or not at all Cognition Overall Cognitive Status: Within Functional Limits for tasks assessed Arousal/Alertness: Awake/alert Orientation Level: Oriented X4 Memory: Impaired Memory Impairment: Decreased long term memory Awareness: Appears  intact Problem Solving: Appears intact Safety/Judgment: Appears intact Sensation Sensation Light Touch: Appears Intact Hot/Cold: Appears Intact Proprioception: Appears Intact Stereognosis: Appears Intact Coordination Gross Motor Movements are Fluid and Coordinated: Yes Fine Motor Movements are Fluid and Coordinated: Yes Coordination and Movement Description: grossly coordinated and demonstrates LE strength imporvements Finger Nose Finger Test: mild tremor RUE Heel Shin Test: limited due to decreased strength and ROM Motor  Motor Motor: Within Functional Limits Motor - Skilled Clinical Observations: decreased strength and endurance although improved from evaluation  Mobility Bed Mobility Bed Mobility: Rolling Right;Rolling Left;Supine to Sit;Sit to Supine Rolling Right: Independent with assistive device Rolling Left: Independent with assistive device Left Sidelying to Sit: Minimal Assistance - Patient >75% Supine to Sit: Supervision Sit to Supine: Supervision Sit to Sidelying Right: Supervision Transfers Transfers: Sit to Stand;Stand to Sit;Stand Pivot Transfers;Squat Pivot Transfers;Lateral/Scoot Transfers Sit to Stand: Supervision/Verbal cueing Stand to Sit: Supervision/Verbal cueing Stand Pivot Transfers: Supervision/Verbal cueing Squat Pivot Transfers: Supervision/Verbal cueing Lateral/Scoot Transfers: Supervision/Verbal cueing Transfer (Assistive device): Rolling walker Locomotion  Gait Ambulation: Yes Gait Assistance: Supervision/Verbal cueing Gait Distance (Feet): 177 Feet Assistive device: Rolling walker Gait Gait: Yes Gait Pattern: Impaired Gait Pattern: Shuffle;Narrow base of support Gait velocity: decreased Stairs / Additional Locomotion Stairs: Yes Stairs Assistance: Supervision/Verbal cueing Stair Management Technique: Two rails Number of Stairs: 4 Height of Stairs: 6 (inches) Ramp: Supervision/Verbal cueing Curb: Psychologist, sport and exercise Mobility: Yes Wheelchair Assistance: Chartered loss adjuster: Both upper extremities Wheelchair Parts Management: Supervision/cueing Distance: 150 ft  Trunk/Postural Assessment  Cervical Assessment Cervical Assessment: Within Functional Limits Thoracic Assessment Thoracic Assessment: Within Functional Limits Lumbar Assessment Lumbar Assessment: Exceptions to [facility] (posterior pelvic tilt) Postural Control Postural Control: Deficits on evaluation Righting Reactions: delayed Protective Responses: delayed  Balance  Balance Balance Assessed: Yes Static Sitting Balance Static Sitting - Balance Support: Feet supported Static Sitting - Level of Assistance: 6: Modified independent (Device/Increase time) Dynamic Sitting Balance Dynamic Sitting - Balance Support: Feet supported Dynamic Sitting - Level of Assistance: 6: Modified independent (Device/Increase time) Dynamic Sitting - Balance Activities: Lateral lean/weight shifting;Reaching for objects Static Standing Balance Static Standing - Level of Assistance: 6: Modified independent (Device/Increase time) (Supervision) Dynamic Standing Balance Dynamic Standing - Balance Support: During functional activity;Bilateral upper extremity supported Dynamic Standing - Level of Assistance: 6: Modified independent (Device/Increase time) (supervision) Dynamic Standing - Balance Activities: Lateral lean/weight shifting;Forward lean/weight shifting Extremity Assessment  RUE Assessment RUE Assessment: Exceptions to [facility] Active Range of Motion (AROM) Comments: Shoulder flexion, abduction: ~ 90 degrees, WFL for all remaining ranges in shoulder, elbow, wrist General Strength Comments: Shoulder flexion, abduction: 3-/5, IR: 5/5, er: 3+/5, Elbow, wrist 5/5 in all ranges. LUE Assessment LUE Assessment: Exceptions to [facility] Active Range of Motion (AROM) Comments: WFL in all ranges General Strength Comments: MMT: shoulder  flexion, abduction, IR 5/5, er: 4-/5. RLE Assessment RLE Assessment: Within Functional Limits General Strength Comments: grossly 4+/5 LLE Assessment LLE Assessment: Within Functional Limits General Strength Comments: grossly 4+/5   Tanja Port PT, DPT  03/25/2022, 12:14 PM

## 2022-03-28 ENCOUNTER — Ambulatory Visit: Payer: 59 | Admitting: Family Medicine

## 2022-03-28 ENCOUNTER — Encounter: Payer: Self-pay | Admitting: Family Medicine

## 2022-03-28 VITALS — BP 106/70 | HR 117 | Temp 99.5°F | Wt 218.0 lb

## 2022-03-28 DIAGNOSIS — R809 Proteinuria, unspecified: Secondary | ICD-10-CM

## 2022-03-28 DIAGNOSIS — Z939 Artificial opening status, unspecified: Secondary | ICD-10-CM

## 2022-03-28 DIAGNOSIS — D649 Anemia, unspecified: Secondary | ICD-10-CM | POA: Diagnosis not present

## 2022-03-28 DIAGNOSIS — Z9049 Acquired absence of other specified parts of digestive tract: Secondary | ICD-10-CM

## 2022-03-28 DIAGNOSIS — K922 Gastrointestinal hemorrhage, unspecified: Secondary | ICD-10-CM

## 2022-03-28 DIAGNOSIS — E1129 Type 2 diabetes mellitus with other diabetic kidney complication: Secondary | ICD-10-CM

## 2022-03-28 DIAGNOSIS — Z87898 Personal history of other specified conditions: Secondary | ICD-10-CM

## 2022-03-28 DIAGNOSIS — K50119 Crohn's disease of large intestine with unspecified complications: Secondary | ICD-10-CM

## 2022-03-28 LAB — MAC SUSCEPTIBILITY BROTH
Amikacin: 8
Ciprofloxacin: 8
Clarithromycin: 1
Doxycycline: 2
Minocycline: 8
Rifabutin: 0.25
Rifampin: 2
Streptomycin: 16

## 2022-03-28 LAB — AFB ORGANISM ID BY DNA PROBE: M avium complex: POSITIVE — AB

## 2022-03-28 LAB — ACID FAST CULTURE WITH REFLEXED SENSITIVITIES (MYCOBACTERIA)
Acid Fast Culture: POSITIVE — AB
Acid Fast Culture: NEGATIVE

## 2022-03-28 LAB — ACID FAST SMEAR (AFB, MYCOBACTERIA): Acid Fast Smear: NEGATIVE

## 2022-03-28 NOTE — Progress Notes (Signed)
03/28/2022  CC:  Chief Complaint  Patient presents with   GI Problem    Patient is a 67 y.o. Caucasian male who presents accompanied by his wife for hospital follow up. Dates hospitalized: Most recent, 12/19 to 03/08/2022.  He was then in inpatient rehab from 03/08/22 to 03/25/2022. Days since d/c from inpatient rehab to home:  3 days.   Reason for most recent admissions to hospital (11/23-12/15, 2023 and again 12/15-12/19, 2023): Lower GI bleed status post recent resection of descending and sigmoid colon with transverse colostomy on 02/08/2022.  I have reviewed patient's discharge summary plus pertinent specific notes, labs, and imaging from the hospitalization.   Workup for prolonged diarrhea revealed Crohn's colitis.  He developed a perforation and peritonitis and had to get emergency laparotomy, partial colon resection, ostomy. Hospitalization complicated by lower GI bleed due to rectal varices and rectal ulcers.  He also had a left lower quadrant abscess.  The abscess required a drain and extensive antibiotics.  Drain was pulled. Had significant hypomagnesemia which was felt to be due to poor p.o. intake.  He also had acute hypoxic respiratory failure due to pneumonia.  He had significant debility and went through an inpatient rehab stay. Discharge medications were Flagyl, prednisone, pantoprazole, Tresiba, atorvastatin, metformin, and magnesium oxide.  UPDATE: Gradually getting energy back.  Unfortunately he tripped going up a step into his garage at home and fell forward and sustained a skin tear on the front of the right leg. Home health PT and nursing begins very soon. Ostomy is doing well.  No problem with excessive stool or constipation. Abdominal wall incision healing well by secondary intention. Has a small pressure ulcer on the gluteal region just to the left of midline.  Medication reconciliation was done today and patient is taking meds as recommended by discharging  hospitalist/specialist.    ROS as above, plus--> lower extremity edema and hospital--improved. No fevers, no CP, no SOB, no wheezing, no cough, no dizziness, no HAs, no rashes, no melena/hematochezia.  No polyuria or polydipsia.  No myalgias or arthralgias.  No focal weakness, paresthesias, or tremors.  No acute vision or hearing abnormalities.  No dysuria or unusual/new urinary urgency or frequency.  No n/v/d or abd pain.  No palpitations.    PMH:  Past Medical History:  Diagnosis Date   Adult ADHD    Allergic rhinitis    Anxiety    claustrophobic   Bronchopneumonia 11/03/2013   Colon polyps 2012 and 2013   All hyperplastic except one adenomatous w/out high grade dysplasia (recall 2023 per Dr. Deatra Ina)   COVID-19 virus infection 09/29/2020   Elevated transaminase level 2017   Viral hep screening neg.  Suspect fatty liver.   Hyperlipidemia    Hypertension    Inflammatory bowel disease    01/2022   Large bowel perforation ([place]) 02/2022   delayed, s/p colonoscopy w/biopsies (new dx crohn's/inflammatory BD)   Obesity    OSA on CPAP    Severe.  CPAP 10 cm H2O.  Follows Dr. Ander Slade.   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 [name] to do surg   Pneumonia    Seborrheic dermatitis of scalp    and face: hydrocort 2% cream bid prn to face, and fluocinonide 0.5% sol'n to scalp bid prn per Dr. Allyson Sabal (04/2014)   Secondary male hypogonadism 04/2021   Type 2 diabetes mellitus with microalbuminuria ([place]) DM dx-09/27/2015. Microalb 2020    PSH:  Past Surgical History:  Procedure Laterality Date  COLONOSCOPY W/ POLYPECTOMY  04/2010; 10/2011   Initial screening TCS showed 6 polyps (one adenomatous).  Pt says he returned for repeat TCS 10/2011 showed one hyperplastic polyp: Recall 10 yrs per Dr. Samuella Cota SIGMOIDOSCOPY N/A 02/25/2022   Procedure: FLEXIBLE SIGMOIDOSCOPY;  Surgeon: Irving Copas., MD;  Location: [facility];  Service: Gastroenterology;  Laterality:  N/A;   FLEXIBLE SIGMOIDOSCOPY N/A 03/04/2022   Procedure: FLEXIBLE SIGMOIDOSCOPY;  Surgeon: Lavena Bullion, DO;  Location: [facility];  Service: Gastroenterology;  Laterality: N/A;   HEMOSTASIS CONTROL  02/25/2022   Procedure: HEMOSTASIS CONTROL;  Surgeon: Irving Copas., MD;  Location: [facility];  Service: Gastroenterology;;   HEMOSTASIS CONTROL  03/04/2022   Procedure: HEMOSTASIS CONTROL;  Surgeon: Lavena Bullion, DO;  Location: [facility];  Service: Gastroenterology;;   INCISION AND DRAINAGE ABSCESS N/A 11/07/2021   Procedure: Incision and drainage of scrotal abscess;  Surgeon: Leighton Ruff, MD;  Location: [facility];  Service: General;  Laterality: N/A;   IR SINUS/FIST TUBE CHK-NON GI  03/06/2022   LAPAROTOMY N/A 02/07/2022   Procedure: EXPLORATORY LAPAROTOMY, COLON RESECTION, OSTOMY;  Surgeon: Leighton Ruff, MD;  Location: [facility];  Service: General;  Laterality: N/A;   MOUTH SURGERY     TRANSTHORACIC ECHOCARDIOGRAM     03/05/22 NORMAL    MEDS:  Outpatient Medications Prior to Visit  Medication Sig Dispense Refill   Accu-Chek Softclix Lancets lancets SMARTSIG:Topical     acetaminophen (TYLENOL) 325 MG tablet Take 2 tablets (650 mg total) by mouth every 4 (four) hours as needed for mild pain, moderate pain, fever or headache.     alum & mag hydroxide-simeth (MAALOX/MYLANTA) 200-200-20 MG/5ML suspension Take 30 mLs by mouth every 6 (six) hours as needed for indigestion or heartburn (or bloating). 355 mL 0   ascorbic acid (VITAMIN C) 500 MG tablet Take 1 tablet (500 mg total) by mouth 2 (two) times daily.     atorvastatin (LIPITOR) 40 MG tablet TAKE 1 TABLET BY MOUTH EVERY DAY 90 tablet 1   diphenhydrAMINE (BENADRYL) 25 mg capsule Take 25 mg by mouth daily as needed for allergies.     docusate sodium (COLACE) 100 MG capsule Take 100 mg by mouth daily.     glucose blood (ACCU-CHEK GUIDE) test strip 1 each by Other route 4 (four) times daily -  before meals and at  bedtime. 200 strip 0   hydrocortisone cream 0.5 % Apply topically 2 (two) times daily as needed for itching. 30 g 0   insulin degludec (TRESIBA) 100 UNIT/ML FlexTouch Pen Inject 20 Units into the skin at bedtime. 15 mL 0   Insulin Pen Needle (AURORA PEN NEEDLES) 31G X 6 MM MISC 1 Application by Does not apply route at bedtime. 100 each 0   magnesium oxide (MAG-OX) 400 (240 Mg) MG tablet Take 2 tablets (800 mg total) by mouth 3 (three) times daily. 180 tablet 0   metFORMIN (GLUCOPHAGE) 1000 MG tablet Take 1 tablet (1,000 mg total) by mouth 2 (two) times daily with a meal. 60 tablet 0   metroNIDAZOLE (FLAGYL) 500 MG tablet Take 1 tablet (500 mg total) by mouth every 12 (twelve) hours. 24 tablet 0   Multiple Vitamin (MULTIVITAMIN WITH MINERALS) TABS tablet Take 1 tablet by mouth daily.     pantoprazole (PROTONIX) 40 MG tablet Take 1 tablet (40 mg total) by mouth daily. 30 tablet 0   polyethylene glycol (MIRALAX) 17 g packet Take 17 g by mouth daily. 30 each 0  predniSONE (DELTASONE) 10 MG tablet Take 3.5 tabs till Thursday. Starting 01/12 decrease to 3 tabs daily for a week. Decrease by 5 mg weekly till gone. 80 tablet 0   prochlorperazine (COMPAZINE) 5 MG tablet Take 1-2 tablets (5-10 mg total) by mouth every 6 (six) hours as needed for nausea. 30 tablet 0   simethicone (MYLICON) 40 DV/7.6HY drops Take 1.2 mLs (80 mg total) by mouth 4 (four) times daily as needed for flatulence (PRN bloating, gassiness). 30 mL 0   thiamine (VITAMIN B-1) 100 MG tablet Take 1 tablet (100 mg total) by mouth daily.     zinc sulfate 220 (50 Zn) MG capsule Take 1 capsule (220 mg total) by mouth daily. 30 capsule 0   naphazoline-glycerin (CLEAR EYES REDNESS) 0.012-0.25 % SOLN Place 1-2 drops into both eyes 4 (four) times daily as needed for eye irritation. (Patient not taking: Reported on 03/28/2022)     No facility-administered medications prior to visit.    Physical Exam    03/28/2022    4:16 PM 03/28/2022    4:12 PM  03/25/2022    5:00 AM  Vitals with BMI  Weight  218 lbs   BMI  07.37   Systolic 106 269 485  Diastolic 70 91 75  Pulse  117 74   Gen: appears tired sitting in WC, does not appear acutely ill IOE:VOJJ: no injection, icteris, swelling, or exudate.  EOMI, PERRLA. Mouth: lips without lesion/swelling.  Oral mucosa pink and moist. Oropharynx without erythema, exudate, or swelling.  CV: RRR, no m/r/g.   LUNGS: CTA bilat, nonlabored resps, good aeration in all lung fields. ABD: soft, NT/ND, BS normal.  Ostomy site w/out erythema or leakage. Large/long midline abd wound healing by secondary intention, granulation tissue appropriate.  No exudate/drainage, no surrounding erythema. EXT: no clubbing or cyanosis.  2-3+ bilat LL pitting edema.  Skin: Right lower leg with 4 to 5 cm jagged skin tear, without acute bleeding. Left sacral stage 2 shallow decubitus ulcer.   Pertinent labs/imaging Last CBC Lab Results  Component Value Date   WBC 9.8 03/25/2022   HGB 11.1 (L) 03/25/2022   HCT 35.5 (L) 03/25/2022   MCV 103.5 (H) 03/25/2022   MCH 32.4 03/25/2022   RDW 16.9 (H) 03/25/2022   PLT 195 00/93/8182   Last metabolic panel Lab Results  Component Value Date   GLUCOSE 168 (H) 03/25/2022   NA 137 03/25/2022   K 3.8 03/25/2022   CL 100 03/25/2022   CO2 29 03/25/2022   BUN 8 03/25/2022   CREATININE 0.57 (L) 03/25/2022   GFRNONAA >60 03/25/2022   CALCIUM 8.9 03/25/2022   PHOS 2.3 (L) 03/01/2022   PROT 5.9 (L) 03/09/2022   ALBUMIN 2.4 (L) 03/09/2022   BILITOT 0.3 03/09/2022   ALKPHOS 101 03/09/2022   AST 21 03/09/2022   ALT 20 03/09/2022   ANIONGAP 8 03/25/2022   Last hemoglobin A1c Lab Results  Component Value Date   HGBA1C 6.1 (H) 02/28/2022   Last thyroid functions Lab Results  Component Value Date   TSH 2.58 03/10/2019   Last vitamin B12 and Folate Lab Results  Component Value Date   VITAMINB12 1,429 (H) 02/16/2022   FOLATE 7.0 12/13/2019   Last magnesium 1.5 on 03/25/22  (1.7-2.4)  ASSESSMENT/PLAN:  #1 Crohn's disease, prednisone taper as per GI.  #2 bowel perforation secondary to #1.  Subsequent abdominal abscess and peritonitis requiring drainage and prolonged antibiotics. He underwent partial colon resection and has an ostomy which is  doing fine.  He is finishing up a course of Flagyl. His large midline surgical wound is healing by secondary intention, no sign of infection.  3.  Lower GI bleed: Secondary to rectal ulcers and rectal varices. Resolved. Check hemoglobin today.  4.  Hypomagnesemia.  Due to poor intake and possibly excessive stool losses. Went home on to 400 mg tabs of magnesium oxide 3 times a day. Check magnesium level today.  5 type 2 diabetes. Most recent A1c in the hospital was 6.1% about 1 month ago. He will remain on Tresiba and titrate to obtain fasting glucose in the 100-110 range. He seems to be tolerating metformin at this time.  He will remain off of Jardiance for now.  #6 debilitation. Home health PT, OT, and nursing to start at any time now.  Medical decision making of high complexity was utilized today.  FOLLOW UP:  04/25/22  Signed:  Crissie Sickles, MD           03/28/2022

## 2022-03-29 ENCOUNTER — Inpatient Hospital Stay: Payer: 59 | Admitting: Family Medicine

## 2022-03-29 LAB — BASIC METABOLIC PANEL
BUN: 14 mg/dL (ref 6–23)
CO2: 23 mEq/L (ref 19–32)
Calcium: 8.7 mg/dL (ref 8.4–10.5)
Chloride: 101 mEq/L (ref 96–112)
Creatinine, Ser: 0.46 mg/dL (ref 0.40–1.50)
GFR: 109.4 mL/min (ref >60.00)
Glucose, Bld: 259 mg/dL — ABNORMAL HIGH (ref 70–99)
Potassium: 4.6 mEq/L (ref 3.5–5.1)
Sodium: 136 mEq/L (ref 135–145)

## 2022-03-29 LAB — CBC WITH DIFFERENTIAL/PLATELET
Basophils Absolute: 0.1 10*3/uL (ref 0.0–0.1)
Basophils Relative: 0.8 % (ref 0.0–3.0)
Eosinophils Absolute: 0 10*3/uL (ref 0.0–0.7)
Eosinophils Relative: 0.1 % (ref 0.0–5.0)
HCT: 37.5 % — ABNORMAL LOW (ref 39.0–52.0)
Hemoglobin: 12.4 g/dL — ABNORMAL LOW (ref 13.0–17.0)
Lymphocytes Relative: 10 % — ABNORMAL LOW (ref 12.0–46.0)
Lymphs Abs: 0.8 10*3/uL (ref 0.7–4.0)
MCHC: 33 g/dL (ref 30.0–36.0)
MCV: 99.4 fl (ref 78.0–100.0)
Monocytes Absolute: 0.3 10*3/uL (ref 0.1–1.0)
Monocytes Relative: 3.4 % (ref 3.0–12.0)
Neutro Abs: 7.1 10*3/uL (ref 1.4–7.7)
Neutrophils Relative %: 85.7 % — ABNORMAL HIGH (ref 43.0–77.0)
Platelets: 251 10*3/uL (ref 150.0–400.0)
RBC: 3.78 Mil/uL — ABNORMAL LOW (ref 4.22–5.81)
RDW: 17.6 % — ABNORMAL HIGH (ref 11.5–15.5)
WBC: 8.3 10*3/uL (ref 4.0–10.5)

## 2022-03-29 LAB — IRON,TIBC AND FERRITIN PANEL
%SAT: 13 % (calc) — ABNORMAL LOW (ref 20–48)
Ferritin: 114 ng/mL (ref 24–380)
Iron: 26 ug/dL — ABNORMAL LOW (ref 50–180)
TIBC: 197 mcg/dL (calc) — ABNORMAL LOW (ref 250–425)

## 2022-03-29 LAB — MAGNESIUM: Magnesium: 1.4 mg/dL — ABNORMAL LOW (ref 1.5–2.5)

## 2022-03-29 LAB — ACID FAST CULTURE WITH REFLEXED SENSITIVITIES (MYCOBACTERIA): Acid Fast Culture: NEGATIVE

## 2022-03-30 ENCOUNTER — Other Ambulatory Visit: Payer: Self-pay | Admitting: Family Medicine

## 2022-03-31 LAB — ACID FAST CULTURE WITH REFLEXED SENSITIVITIES (MYCOBACTERIA): Acid Fast Culture: NEGATIVE

## 2022-04-01 ENCOUNTER — Telehealth: Payer: Self-pay | Admitting: Family Medicine

## 2022-04-01 ENCOUNTER — Telehealth: Payer: Self-pay

## 2022-04-01 NOTE — Telephone Encounter (Signed)
Caller/Agency: [facility]  Callback Number: [phone] Requesting OT Frequency: 2 times a week for 2 weeks,  1 time a week for 1 week.

## 2022-04-01 NOTE — Telephone Encounter (Signed)
Yes okay 

## 2022-04-01 NOTE — Telephone Encounter (Signed)
Caller/Agency: Jonette Mate Callback Number: [phone]  Requesting OT/PT/Skilled Nursing/Social Work/Speech Therapy:  PT eval was completed today  Frequency:  2 times weekly for 3 weeks 1 time weekly for 2 weeks  She is also requesting heart rate perimeters for patient.

## 2022-04-02 ENCOUNTER — Encounter: Payer: Self-pay | Admitting: Gastroenterology

## 2022-04-02 NOTE — Telephone Encounter (Signed)
Okay for orders? 

## 2022-04-02 NOTE — Telephone Encounter (Signed)
Yes okay 

## 2022-04-02 NOTE — Telephone Encounter (Signed)
Approved orders given to [facility].

## 2022-04-02 NOTE — Telephone Encounter (Signed)
LVM for pt to return call

## 2022-04-03 NOTE — Telephone Encounter (Signed)
Please Advise on heart rate parameters based on last visit.

## 2022-04-03 NOTE — Telephone Encounter (Signed)
Yes, orders okay. Call if heart rate less than 50 or greater than 125.

## 2022-04-03 NOTE — Telephone Encounter (Signed)
Spoke with [facility] regarding results/recommendations.

## 2022-04-04 ENCOUNTER — Other Ambulatory Visit (INDEPENDENT_AMBULATORY_CARE_PROVIDER_SITE_OTHER): Payer: Managed Care, Other (non HMO)

## 2022-04-04 ENCOUNTER — Ambulatory Visit (INDEPENDENT_AMBULATORY_CARE_PROVIDER_SITE_OTHER): Payer: Managed Care, Other (non HMO) | Admitting: Gastroenterology

## 2022-04-04 ENCOUNTER — Encounter: Payer: Self-pay | Admitting: Gastroenterology

## 2022-04-04 VITALS — BP 128/62 | HR 130

## 2022-04-04 DIAGNOSIS — D509 Iron deficiency anemia, unspecified: Secondary | ICD-10-CM

## 2022-04-04 DIAGNOSIS — K50119 Crohn's disease of large intestine with unspecified complications: Secondary | ICD-10-CM

## 2022-04-04 DIAGNOSIS — R600 Localized edema: Secondary | ICD-10-CM

## 2022-04-04 DIAGNOSIS — Z7189 Other specified counseling: Secondary | ICD-10-CM

## 2022-04-04 DIAGNOSIS — R531 Weakness: Secondary | ICD-10-CM

## 2022-04-04 LAB — HEPATIC FUNCTION PANEL
ALT: 12 U/L (ref 0–53)
AST: 13 U/L (ref 0–37)
Albumin: 3.4 g/dL — ABNORMAL LOW (ref 3.5–5.2)
Alkaline Phosphatase: 82 U/L (ref 39–117)
Bilirubin, Direct: 0.1 mg/dL (ref 0.0–0.3)
Total Bilirubin: 0.5 mg/dL (ref 0.2–1.2)
Total Protein: 6.7 g/dL (ref 6.0–8.3)

## 2022-04-04 MED ORDER — FUROSEMIDE 20 MG PO TABS: 20.0000 mg | ORAL_TABLET | ORAL | 3 refills | Status: DC

## 2022-04-04 NOTE — Progress Notes (Signed)
Chief Complaint:    Crohn's Disease   HPI:     Patient is a 67 y.o. male with newly diagnosed advanced phenotypic, penetrating colonic Crohn's Disease presenting to the [facility] for hospital follow-up.   He was initially seen by me on 01/22/2022 for evaluation of diarrhea, fecal leakage.  He underwent colonoscopy on 02/04/2022 which was notable for severe colitis with deeply cratered ulcers throughout the colon and mucosal friability and normal TI (path: Chronic active colitis).  He was admitted on 02/07/2022 with sigmoid perforation requiring partial colectomy and transverse colostomy and prolonged hospital course c/b rectal bleeding, abscess, pneumonia with hypoxic respiratory failure, and significant disability requiring  inpatient rehab, eventually discharged on 03/25/2022. - 02/07/2022:Admitted with sigmoid perforation, requiring emergent surgery with descending/sigmoid colectomy, transverse colostomy. - 02/25/2022: Active lower GI bleed.  Sigmoidoscopy performed emergently and notable for deep cratered rectal ulcers with bleeding stigmata, treated with PuraSTAT gel - 02/26/2022: IR drainage large LLQ abscess, grew E coli, bacteroides.  Treated with Flagyl, Rocephin.  Drain interrogated (no fistula, collapsed/resolved abscess) and removed on 12/20.   -03/04/2022: Repeat flexible sigmoidoscopy for recurrent bleed.  Cratered ulcers, but overall improved from the previous sigmoidoscopy.  Again treated any stigmata with PuraSTAT gel with resolution -03/06/2022: Started Remicade 12/20 along with short-chain fatty acid enemas x 2 weeks for possible overlapping diversion colitis. -03/07/2022: Changed IV Solu-Medrol to prednisone 40 mg/day.  Plan for continued Flagyl low-dose x 4 weeks.  Surgery recommended against repeat colonoscopy via ostomy that close to surgery. - 03/20/2022: Remicade dose #2 and started prednisone taper - 03/25/2022: Discharged from inpatient rehab to home  Repeat  labs on 03/28/2022 with uptrending H/H and normal WBC, PLT.  Ferritin 114, iron 26, TIBC 197, sat 13%; was started on ferrous sulfate by PCM.  Mag 1.4, otherwise normal BMP (BG 259); continued on oral magnesium supplement.  No new imaging since hospital discharge.  He was seen in follow-up by his PCM in 03/28/2022.  He has finished 4 weeks of Flagyl.  He unfortunately fell on a step on trying to get back into his house shortly after returning from hospital discharge with RLE laceration.  This was addressed by his PCM.  Ambulating upstairs and still a major barrier for him.  He has had home PT and nursing come out to the house for evaluations, but so far not much in the way of actual PT/OT.  He is scheduled for PT later today and hoping that he gets some meaningful therapy.  Does have b/l LE edema.  No SOB or CP.  Ostomy with good output and no blood.  Good appetite and p.o. intake.   Review of systems:     No chest pain, no SOB, no fevers, no urinary sx   Past Medical History:  Diagnosis Date   Adult ADHD    Allergic rhinitis    Anxiety    claustrophobic   Bronchopneumonia 11/03/2013   Colon polyps 2012 and 2013   All hyperplastic except one adenomatous w/out high grade dysplasia (recall 2023 per Dr. Deatra Ina)   COVID-19 virus infection 09/29/2020   Elevated transaminase level 2017   Viral hep screening neg.  Suspect fatty liver.   Hyperlipidemia    Hypertension    Inflammatory bowel disease    01/2022   Large bowel perforation ([place]) 02/2022   delayed, s/p colonoscopy w/biopsies (new dx crohn's/inflammatory BD)   Obesity    OSA on CPAP    Severe.  CPAP 10 cm H2O.  Follows Dr. Ander Slade.   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 [name] to do surg   Pneumonia    Seborrheic dermatitis of scalp    and face: hydrocort 2% cream bid prn to face, and fluocinonide 0.5% sol'n to scalp bid prn per Dr. Allyson Sabal (04/2014)   Secondary male hypogonadism 04/2021   Type 2 diabetes  mellitus with microalbuminuria ([place]) DM dx-09/27/2015. Microalb 2020    Patient's surgical history, family medical history, social history, medications and allergies were all reviewed in Epic    Current Outpatient Medications  Medication Sig Dispense Refill   Accu-Chek Softclix Lancets lancets SMARTSIG:Topical     acetaminophen (TYLENOL) 325 MG tablet Take 2 tablets (650 mg total) by mouth every 4 (four) hours as needed for mild pain, moderate pain, fever or headache.     alum & mag hydroxide-simeth (MAALOX/MYLANTA) 200-200-20 MG/5ML suspension Take 30 mLs by mouth every 6 (six) hours as needed for indigestion or heartburn (or bloating). 355 mL 0   ascorbic acid (VITAMIN C) 500 MG tablet Take 1 tablet (500 mg total) by mouth 2 (two) times daily.     atorvastatin (LIPITOR) 40 MG tablet TAKE 1 TABLET BY MOUTH EVERY DAY 90 tablet 1   diphenhydrAMINE (BENADRYL) 25 mg capsule Take 25 mg by mouth daily as needed for allergies.     docusate sodium (COLACE) 100 MG capsule Take 100 mg by mouth daily.     glucose blood (ACCU-CHEK GUIDE) test strip 1 each by Other route 4 (four) times daily -  before meals and at bedtime. 200 strip 0   hydrocortisone cream 0.5 % Apply topically 2 (two) times daily as needed for itching. 30 g 0   insulin degludec (TRESIBA) 100 UNIT/ML FlexTouch Pen Inject 20 Units into the skin at bedtime. 15 mL 0   Insulin Pen Needle (AURORA PEN NEEDLES) 31G X 6 MM MISC 1 Application by Does not apply route at bedtime. 100 each 0   magnesium oxide (MAG-OX) 400 (240 Mg) MG tablet Take 2 tablets (800 mg total) by mouth 3 (three) times daily. 180 tablet 0   metFORMIN (GLUCOPHAGE) 1000 MG tablet Take 1 tablet (1,000 mg total) by mouth 2 (two) times daily with a meal. 60 tablet 0   metroNIDAZOLE (FLAGYL) 500 MG tablet Take 1 tablet (500 mg total) by mouth every 12 (twelve) hours. 24 tablet 0   Multiple Vitamin (MULTIVITAMIN WITH MINERALS) TABS tablet Take 1 tablet by mouth daily.      naphazoline-glycerin (CLEAR EYES REDNESS) 0.012-0.25 % SOLN Place 1-2 drops into both eyes 4 (four) times daily as needed for eye irritation. (Patient not taking: Reported on 03/28/2022)     pantoprazole (PROTONIX) 40 MG tablet Take 1 tablet (40 mg total) by mouth daily. 30 tablet 0   polyethylene glycol (MIRALAX) 17 g packet Take 17 g by mouth daily. 30 each 0   predniSONE (DELTASONE) 10 MG tablet Take 3.5 tabs till Thursday. Starting 01/12 decrease to 3 tabs daily for a week. Decrease by 5 mg weekly till gone. 80 tablet 0   prochlorperazine (COMPAZINE) 5 MG tablet Take 1-2 tablets (5-10 mg total) by mouth every 6 (six) hours as needed for nausea. 30 tablet 0   simethicone (MYLICON) 40 KG/4.0NU drops Take 1.2 mLs (80 mg total) by mouth 4 (four) times daily as needed for flatulence (PRN bloating, gassiness). 30 mL 0   thiamine (VITAMIN B-1) 100 MG tablet Take 1 tablet (100 mg total) by mouth daily.  zinc sulfate 220 (50 Zn) MG capsule Take 1 capsule (220 mg total) by mouth daily. 30 capsule 0   No current facility-administered medications for this visit.    Physical Exam:     There were no vitals taken for this visit.  GENERAL:  Pleasant male in NAD PSYCH: : Cooperative, normal affect ABDOMEN: Ostomy in left hemiabdomen in place and healthy appearing.  Wound dressing over right hemiabdomen surgical site., c/d/I. Nondistended, soft, nontender. SKIN:  turgor, no lesions seen Musculoskeletal:  b/l LE edema to mid shins NEURO: Alert and oriented x 3, no focal neurologic deficits   IMPRESSION and PLAN:    1) Crohn's disease - Continue Remicade.  Will coordinate for ordering and scheduling of dose #3 of induction then get on maintenance schedule - Plan to check infliximab antibody and trough level in 2 months when at maintenance dosing for prophylactic drug monitoring - TPMT level is 7 (low metabolizer); no plan to initiate immunomodulators therapy - UTD on vaccines - To follow-up with  Dr. [name] on 04/17/2022 re: ostomy management - Discussed referral to [facility] today.  He would like to think about this more and let me know  2) Ostomy - Has follow-up with Dr. [name] later this month  3) Iron deficiency anemia - Anemia has resolved but iron indices still on the lower side.  Was started on oral iron last week - Continue oral iron therapy - Repeat CBC and iron panel in 2 months  4) Lower extremity edema - Check serum albumin today - Discussed starting on diuretics.  Some hesitation due to the high-dose diuretics he received as an inpatient.  Will start gently with Lasix 20 mg qod - Should follow-up with Dr. Anitra Lauth for continued management with referral to Cardiology as appropriate in his opinion - Will need BMP checked 7-10 days after starting diuretic if continuing to take regularly  5) Debilitation - He will update me on how his PT goes later this afternoon and we will determine if we need to send in a new referral for PT/OT and possibly home health nurse.  Will plan to coordinate that with Dr. Alice Rieger through PM&R for recommendations and guidance   RTC in 2 months or sooner as needed  I spent 50 minutes of time, including in depth chart review, independent review of results as outlined above, communicating results with the patient directly, face-to-face time with the patient, coordinating care, ordering studies and medications as appropriate, and documentation.         [place] ,DO, FACG 04/04/2022, 8:22 AM

## 2022-04-04 NOTE — Patient Instructions (Addendum)
Your provider has requested that you go to the basement level for lab work before leaving today. Press "B" on the elevator. The lab is located at the first door on the left as you exit the elevator.   We have sent the following medications to your pharmacy for you to pick up at your convenience:   Lasix 20 MG Every Other Day.  We will contact you for Remicade Infusion.  _______________________________________________________  If your blood pressure at your visit was 140/90 or greater, please contact your primary care physician to follow up on this.  _______________________________________________________  If you are age 50 or older, your body mass index should be between 23-30. Your There is no height or weight on file to calculate BMI. If this is out of the aforementioned range listed, please consider follow up with your Primary Care Provider.  __________________________________________________________  The [place] [facility] providers would like to encourage you to use [facility] to communicate with providers for non-urgent requests or questions.  Due to long hold times on the telephone, sending your provider a message by [facility] may be a faster and more efficient way to get a response.  Please allow 48 business hours for a response.  Please remember that this is for non-urgent requests.   Due to recent changes in healthcare laws, you may see the results of your imaging and laboratory studies on MyChart before your provider has had a chance to review them.  We understand that in some cases there may be results that are confusing or concerning to you. Not all laboratory results come back in the same time frame and the provider may be waiting for multiple results in order to interpret others.  Please give [place] 48 hours in order for your provider to thoroughly review all the results before contacting the office for clarification of your results.     Thank you for choosing me and [facility].  Vito Cirigliano, D.O.

## 2022-04-05 ENCOUNTER — Other Ambulatory Visit: Payer: Self-pay | Admitting: Pharmacy Technician

## 2022-04-05 ENCOUNTER — Telehealth: Payer: Self-pay

## 2022-04-05 ENCOUNTER — Encounter: Payer: Self-pay | Admitting: Gastroenterology

## 2022-04-05 ENCOUNTER — Other Ambulatory Visit: Payer: Self-pay

## 2022-04-05 NOTE — Telephone Encounter (Signed)
-----  Message from Howell Pringle, [place] sent at 04/05/2022  1:08 PM EST ----- Regarding: RE: Remicade I am not sure.  ----- Message ----- From: Marice Potter, RN Sent: 04/05/2022  11:20 AM EST To: Howell Pringle, CMA Subject: RE: Remicade                                   Do you know where he received the first two infusions?  ----- Message ----- From: Howell Pringle, CMA Sent: 04/05/2022  10:11 AM EST To: Marice Potter, RN Subject: Remicade                                       Will you obtain Remicade  PA for this patient? Thanks!

## 2022-04-05 NOTE — Telephone Encounter (Signed)
Orders for remicade placed at [facility] center at [facility] [address].

## 2022-04-08 ENCOUNTER — Telehealth: Payer: Self-pay

## 2022-04-08 NOTE — Telephone Encounter (Signed)
Home health orders received 04/05/22 for Richard [name] health initiation orders: Yes.  Home health re-certification orders: No. Patient [name] seen by ordering physician for this condition: 03/28/22. Must be less than 90 days for re-certification and less than 30 days prior for initiation. Visit must have been for the condition the orders are being placed.  Patient meets criteria for Physician to sign orders: Yes.        Current med list has been attached: Yes        Orders placed on physicians desk for signature: 04/08/22 (date) If patient does not meet criteria for orders to be signed: pt was called to schedule appt. Appt is scheduled for 04/26/22.   Emilee Hero

## 2022-04-09 ENCOUNTER — Other Ambulatory Visit: Payer: Self-pay | Admitting: Family Medicine

## 2022-04-09 ENCOUNTER — Encounter: Payer: Self-pay | Admitting: Family Medicine

## 2022-04-09 DIAGNOSIS — R600 Localized edema: Secondary | ICD-10-CM

## 2022-04-09 NOTE — Telephone Encounter (Signed)
Continue lasix 20 qod and arrange bmet (already ordered)

## 2022-04-11 ENCOUNTER — Telehealth: Payer: Self-pay

## 2022-04-11 DIAGNOSIS — K50119 Crohn's disease of large intestine with unspecified complications: Secondary | ICD-10-CM

## 2022-04-11 NOTE — Telephone Encounter (Signed)
Called pt back to give him an update on remicade infusion. Let pt know that orders were placed at infusion center on [address] and Josem Kaufmann was submitted yesterday. Also let pt know that it can generally take 5-10 business days for an approval. Pt verbalized understanding and had no other concerns at end of call.

## 2022-04-12 ENCOUNTER — Emergency Department (HOSPITAL_BASED_OUTPATIENT_CLINIC_OR_DEPARTMENT_OTHER)
Admission: EM | Admit: 2022-04-12 | Discharge: 2022-04-13 | Disposition: A | Discharge status: Home / Self Care | Payer: Managed Care, Other (non HMO) | Attending: Emergency Medicine | Admitting: Emergency Medicine

## 2022-04-12 ENCOUNTER — Encounter: Payer: Self-pay | Admitting: Family Medicine

## 2022-04-12 ENCOUNTER — Encounter: Payer: Self-pay | Admitting: Gastroenterology

## 2022-04-12 ENCOUNTER — Emergency Department (HOSPITAL_BASED_OUTPATIENT_CLINIC_OR_DEPARTMENT_OTHER): Payer: Managed Care, Other (non HMO)

## 2022-04-12 ENCOUNTER — Telehealth: Payer: Self-pay | Admitting: Family Medicine

## 2022-04-12 ENCOUNTER — Other Ambulatory Visit: Payer: Self-pay

## 2022-04-12 DIAGNOSIS — I82431 Acute embolism and thrombosis of right popliteal vein: Secondary | ICD-10-CM | POA: Insufficient documentation

## 2022-04-12 DIAGNOSIS — M7989 Other specified soft tissue disorders: Secondary | ICD-10-CM | POA: Diagnosis present

## 2022-04-12 HISTORY — DX: Personal history of pneumonia (recurrent): Z87.01

## 2022-04-12 LAB — CBC WITH DIFFERENTIAL/PLATELET
Abs Immature Granulocytes: 0.03 10*3/uL (ref 0.00–0.07)
Basophils Absolute: 0 10*3/uL (ref 0.0–0.1)
Basophils Relative: 0 %
Eosinophils Absolute: 0 10*3/uL (ref 0.0–0.5)
Eosinophils Relative: 0 %
HCT: 37.5 % — ABNORMAL LOW (ref 39.0–52.0)
Hemoglobin: 12.3 g/dL — ABNORMAL LOW (ref 13.0–17.0)
Immature Granulocytes: 0 %
Lymphocytes Relative: 12 %
Lymphs Abs: 1.1 10*3/uL (ref 0.7–4.0)
MCH: 31.6 pg (ref 26.0–34.0)
MCHC: 32.8 g/dL (ref 30.0–36.0)
MCV: 96.4 fL (ref 80.0–100.0)
Monocytes Absolute: 0.7 10*3/uL (ref 0.1–1.0)
Monocytes Relative: 7 %
Neutro Abs: 7.7 10*3/uL (ref 1.7–7.7)
Neutrophils Relative %: 81 %
Platelets: 298 10*3/uL (ref 150–400)
RBC: 3.89 MIL/uL — ABNORMAL LOW (ref 4.22–5.81)
RDW: 14.8 % (ref 11.5–15.5)
WBC: 9.6 10*3/uL (ref 4.0–10.5)
nRBC: 0 % (ref 0.0–0.2)

## 2022-04-12 LAB — BASIC METABOLIC PANEL
Anion gap: 7 (ref 5–15)
BUN: 14 mg/dL (ref 8–23)
CO2: 26 mmol/L (ref 22–32)
Calcium: 9 mg/dL (ref 8.9–10.3)
Chloride: 101 mmol/L (ref 98–111)
Creatinine, Ser: 0.49 mg/dL — ABNORMAL LOW (ref 0.61–1.24)
GFR, Estimated: >60 mL/min (ref >60)
Glucose, Bld: 241 mg/dL — ABNORMAL HIGH (ref 70–99)
Potassium: 4.1 mmol/L (ref 3.5–5.1)
Sodium: 134 mmol/L — ABNORMAL LOW (ref 135–145)

## 2022-04-12 MED ORDER — APIXABAN (ELIQUIS) VTE STARTER PACK (10MG AND 5MG): ORAL_TABLET | ORAL | 0 refills | Status: DC

## 2022-04-12 MED ORDER — APIXABAN 2.5 MG PO TABS
10.0000 mg | ORAL_TABLET | Freq: Two times a day (BID) | ORAL | Status: DC
  Administered 2022-04-12: 10 mg via ORAL
  Filled 2022-04-12: qty 4

## 2022-04-12 MED ORDER — APIXABAN 2.5 MG PO TABS: 5.0000 mg | ORAL_TABLET | Freq: Two times a day (BID) | ORAL | Status: DC

## 2022-04-12 NOTE — Telephone Encounter (Signed)
Vevelyn Royals can we have someone check around and see if we can get him a right leg venous doppler ultrasound to evaluate for DVT this afternoon? I'll order stat for med [facility] so I can get an order in the system but can change order if diff location is necessary.  Also, can Reynold Bowen take pics of both legs so I can see them---send to mychart.

## 2022-04-12 NOTE — Discharge Instructions (Addendum)
Please read and follow all provided instructions.  Your diagnoses today include:  1. Acute deep vein thrombosis (DVT) of popliteal vein of right lower extremity (HCC)     Tests performed today include: An ultrasound of your leg that shows a blood clot Vital signs. See below for your results today.   Medications prescribed:  Eliquis: Blood thinner medication  Take any prescribed medications only as directed.  Home care instructions:  Follow any educational materials contained in this packet.  BE VERY CAREFUL not to take multiple medicines containing Tylenol (also called acetaminophen). Doing so can lead to an overdose which can damage your liver and cause liver failure and possibly death.   Follow-up instructions: Please follow-up with your primary care provider in the next 3 days for further evaluation of your symptoms. This is very important for the management of your medications.   Return instructions:  Please return to the Emergency Department if you experience worsening symptoms. Return immediately if you develop chest pain, shortness of breath, dizziness or fainting. Return with new color change or weakness/numbness to your affected leg(s). Return with bleeding, severe headache, confusion or altered mental status. Please return if you have any other emergent concerns.  Additional Information:  Your vital signs today were: BP (!) 128/92 (BP Location: Right Arm)   Pulse 94   Temp 99.2 F (37.3 C) (Oral)   Resp 14   SpO2 98%  If your blood pressure (BP) was elevated above 135/85 this visit, please have this repeated by your doctor within one month. --------------  Information on my medicine - ELIQUIS (apixaban)  This medication education was reviewed with me or my healthcare representative as part of my discharge preparation.   Why was Eliquis prescribed for you? Eliquis was prescribed to treat blood clots that may have been found in the veins of your legs (deep  vein thrombosis) or in your lungs (pulmonary embolism) and to reduce the risk of them occurring again.  What do You need to know about Eliquis ? The starting dose is 10 mg (two 5 mg tablets) taken TWICE daily for the FIRST SEVEN (7) DAYS, then on  April 19, 2022 EVENING DOSE  the dose is reduced to ONE 5 mg tablet taken TWICE daily.  Eliquis may be taken with or without food.   Try to take the dose about the same time in the morning and in the evening. If you have difficulty swallowing the tablet whole please discuss with your pharmacist how to take the medication safely.  Take Eliquis exactly as prescribed and DO NOT stop taking Eliquis without talking to the doctor who prescribed the medication.  Stopping may increase your risk of developing a new blood clot.  Refill your prescription before you run out.  After discharge, you should have regular check-up appointments with your healthcare provider that is prescribing your Eliquis.    What do you do if you miss a dose? If a dose of ELIQUIS is not taken at the scheduled time, take it as soon as possible on the same day and twice-daily administration should be resumed. The dose should not be doubled to make up for a missed dose.  Important Safety Information A possible side effect of Eliquis is bleeding. You should call your healthcare provider right away if you experience any of the following: Bleeding from an injury or your nose that does not stop. Unusual colored urine (red or dark brown) or unusual colored stools (red or black). Unusual bruising for  unknown reasons. A serious fall or if you hit your head (even if there is no bleeding).  Some medicines may interact with Eliquis and might increase your risk of bleeding or clotting while on Eliquis. To help avoid this, consult your healthcare provider or pharmacist prior to using any new prescription or non-prescription medications, including herbals, vitamins, non-steroidal  anti-inflammatory drugs (NSAIDs) and supplements.  This website has more information on Eliquis (apixaban): http://www.eliquis.com/eliquis/home

## 2022-04-12 NOTE — Telephone Encounter (Signed)
Pt informed to send pics as soon as possible, he does not think he will be able to complete imaging due to work schedule. Provider will review pics and decide next steps.

## 2022-04-12 NOTE — Telephone Encounter (Signed)
Please give Richard [name] a call from [place] home health. She is concerned about [name] right leg; she says it is significantly more swollen than the left and she is concerned. Please her a call at [phone]

## 2022-04-12 NOTE — Progress Notes (Signed)
ANTICOAGULATION CONSULT NOTE - Initial Consult  Pharmacy Consult for Apixaban Indication:  acute RLE DVT  Allergies  Allergen Reactions   Lobster [Shellfish Allergy] Nausea Only   Poison Ivy Extract Itching    Patient Measurements:    Vital Signs: Temp: 99.2 F (37.3 C) (01/26 2121) Temp Source: Oral (01/26 2121) BP: 128/92 (01/26 2121) Pulse Rate: 94 (01/26 2121)  Labs: Recent Labs    04/12/22 1851  HGB 12.3*  HCT 37.5*  PLT 298  CREATININE 0.49*    CrCl cannot be calculated (Unknown ideal weight.).   Medical History: Past Medical History:  Diagnosis Date   Adult ADHD    Allergic rhinitis    Anxiety    claustrophobic   Bronchopneumonia 11/03/2013   Colon polyps 2012 and 2013   All hyperplastic except one adenomatous w/out high grade dysplasia (recall 2023 per Dr. Deatra Ina)   COVID-19 virus infection 09/29/2020   Elevated transaminase level 2017   Viral hep screening neg.  Suspect fatty liver.   History of pneumonia    Hyperlipidemia    Hypertension    Inflammatory bowel disease    01/2022   Large bowel perforation ([place]) 02/2022   delayed, s/p colonoscopy w/biopsies (new dx crohn's/inflammatory BD)   Obesity    OSA on CPAP    Severe.  CPAP 10 cm H2O.  Follows Dr. Ander Slade.   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 [name] did surg   Seborrheic dermatitis of scalp    and face: hydrocort 2% cream bid prn to face, and fluocinonide 0.5% sol'n to scalp bid prn per Dr. Allyson Sabal (04/2014)   Secondary male hypogonadism 04/2021   Type 2 diabetes mellitus with microalbuminuria ([place]) DM dx-09/27/2015. Microalb 2020    Medications:  (Not in a hospital admission)   Assessment: 67 yo M presents to ED with RLE swelling concerning for DVT. Doppler positive for acute occlusive R femoral and popliteal DVT. Pt was not taking anticoagulation prior to admission. Pharmacy consulted to dose apixaban for acute DVT.   Hgb 12.3, Plt 298 - no s/sx of  bleeding  Goal of Therapy:  Heparin level 0.3-0.7 units/ml Monitor platelets by anticoagulation protocol: Yes   Plan:  Start apixaban '10mg'$  po BID x 7 days, followed by apixaban '5mg'$  po BID thereafter.  Monitor daily CBC and for s/sx of bleeding Pharmacy to provide education prior to discharge  Luisa Hart, PharmD, BCPS Clinical Pharmacist 04/12/2022 11:26 PM   Please refer to [facility] for pharmacy phone number

## 2022-04-12 NOTE — ED Provider Notes (Signed)
[facility] Provider Note   CSN: [id number] Arrival date & time: 04/12/22  1825     History  Chief Complaint  Patient presents with   Leg Swelling    Richard [name] is a 67 y.o. male.  Patient with history of Crohn's disease, prolonged hospitalization in November and December 2023 with rehab stay into January 2024.  He had sigmoid colon perforation requiring surgery, resection, and colostomy.  He has had GI bleeding at times as well.  Patient was sent to the emergency department today for evaluation of right lower extremity swelling and to evaluate for possibility of DVT.  Patient states that he has had some swelling in his leg since being discharged from the hospital.  He has had some mild discomfort.  He denies associated chest pain, shortness of breath, lightheadedness or syncope.  He has had home health, PT and OT.  One of his home health nurses today was concerned about the leg and contacted the patient's doctor.  They advised him to come into the emergency department.  He denies any active or recent bleeding other than some mild nosebleeds "because the area is dry".  He has not noted any rectal bleeding or bleeding through the ostomy.       Home Medications Prior to Admission medications   Medication Sig Start Date End Date Taking? Authorizing Provider  Accu-Chek Softclix Lancets lancets SMARTSIG:Topical 01/19/20   [provider]  acetaminophen (TYLENOL) 325 MG tablet Take 2 tablets (650 mg total) by mouth every 4 (four) hours as needed for mild pain, moderate pain, fever or headache. 03/01/22   Shalhoub, Sherryll Burger, MD  alum & mag hydroxide-simeth (MAALOX/MYLANTA) 200-200-20 MG/5ML suspension Take 30 mLs by mouth every 6 (six) hours as needed for indigestion or heartburn (or bloating). 03/01/22   Shalhoub, Sherryll Burger, MD  ascorbic acid (VITAMIN C) 500 MG tablet Take 1 tablet (500 mg total) by mouth 2 (two) times daily. 03/01/22    Shalhoub, Sherryll Burger, MD  atorvastatin (LIPITOR) 40 MG tablet TAKE 1 TABLET BY MOUTH EVERY DAY 02/01/22   McGowen, Adrian Blackwater, MD  diphenhydrAMINE (BENADRYL) 25 mg capsule Take 25 mg by mouth daily as needed for allergies.    [provider]  docusate sodium (COLACE) 100 MG capsule Take 100 mg by mouth daily. 03/25/22   [provider]  furosemide (LASIX) 20 MG tablet Take 1 tablet (20 mg total) by mouth every other day. 04/04/22   Cirigliano, Vito V, DO  glucose blood (ACCU-CHEK GUIDE) test strip 1 each by Other route 4 (four) times daily -  before meals and at bedtime. 03/25/22   Love, Ivan Anchors, PA-C  hydrocortisone cream 0.5 % Apply topically 2 (two) times daily as needed for itching. 03/25/22   Love, Ivan Anchors, PA-C  insulin degludec (TRESIBA) 100 UNIT/ML FlexTouch Pen Inject 20 Units into the skin at bedtime. 03/25/22   Love, Ivan Anchors, PA-C  Insulin Pen Needle (AURORA PEN NEEDLES) 31G X 6 MM MISC 1 Application by Does not apply route at bedtime. 03/25/22   Love, Ivan Anchors, PA-C  magnesium oxide (MAG-OX) 400 (240 Mg) MG tablet Take 2 tablets (800 mg total) by mouth 3 (three) times daily. 03/25/22   Love, Ivan Anchors, PA-C  metFORMIN (GLUCOPHAGE) 1000 MG tablet Take 1 tablet (1,000 mg total) by mouth 2 (two) times daily with a meal. 03/25/22   Love, Ivan Anchors, PA-C  metroNIDAZOLE (FLAGYL) 500 MG tablet Take 1 tablet (  500 mg total) by mouth every 12 (twelve) hours. 03/25/22   Love, Ivan Anchors, PA-C  Multiple Vitamin (MULTIVITAMIN WITH MINERALS) TABS tablet Take 1 tablet by mouth daily. 03/02/22   Shalhoub, Sherryll Burger, MD  naphazoline-glycerin (CLEAR EYES REDNESS) 0.012-0.25 % SOLN Place 1-2 drops into both eyes 4 (four) times daily as needed for eye irritation. Patient not taking: Reported on 03/28/2022 03/12/22   Love, Ivan Anchors, PA-C  pantoprazole (PROTONIX) 40 MG tablet Take 1 tablet (40 mg total) by mouth daily. 03/25/22   Love, Ivan Anchors, PA-C  polyethylene glycol (MIRALAX) 17 g packet Take 17 g by mouth daily.  03/25/22   Love, Ivan Anchors, PA-C  predniSONE (DELTASONE) 10 MG tablet Take 3.5 tabs till Thursday. Starting 01/12 decrease to 3 tabs daily for a week. Decrease by 5 mg weekly till gone. 03/25/22   Love, Ivan Anchors, PA-C  prochlorperazine (COMPAZINE) 5 MG tablet Take 1-2 tablets (5-10 mg total) by mouth every 6 (six) hours as needed for nausea. 03/25/22   Love, Ivan Anchors, PA-C  simethicone (MYLICON) 40 KZ/6.0FU drops Take 1.2 mLs (80 mg total) by mouth 4 (four) times daily as needed for flatulence (PRN bloating, gassiness). 03/12/22   Love, Ivan Anchors, PA-C  thiamine (VITAMIN B-1) 100 MG tablet Take 1 tablet (100 mg total) by mouth daily. 03/02/22   Shalhoub, Sherryll Burger, MD  zinc sulfate 220 (50 Zn) MG capsule Take 1 capsule (220 mg total) by mouth daily. 03/26/22   Love, Ivan Anchors, PA-C      Allergies    Lobster [shellfish allergy] and Poison ivy extract    Review of Systems   Review of Systems  Physical Exam Updated Vital Signs BP (!) 128/92 (BP Location: Right Arm)   Pulse 94   Temp 99.2 F (37.3 C) (Oral)   Resp 14   SpO2 98%  Physical Exam Vitals and nursing note reviewed.  Constitutional:      Appearance: He is well-developed.  HENT:     Head: Normocephalic and atraumatic.  Eyes:     Conjunctiva/sclera: Conjunctivae normal.  Pulmonary:     Effort: No respiratory distress.  Musculoskeletal:        General: Tenderness present.     Cervical back: Normal range of motion and neck supple.     Right lower leg: Edema present.     Comments: Patient has swelling of the bilateral lower extremities, right greater than left.  Minimal tenderness to palpation.  No signs of cellulitis or abscess.  Distal sensation intact.  Intact pedal pulses.  Skin:    General: Skin is warm and dry.  Neurological:     Mental Status: He is alert.     ED Results / Procedures / Treatments   Labs (all labs ordered are listed, but only abnormal results are displayed) Labs Reviewed  CBC WITH DIFFERENTIAL/PLATELET -  Abnormal; Notable for the following components:      Result Value   RBC 3.89 (*)    Hemoglobin 12.3 (*)    HCT 37.5 (*)    All other components within normal limits  BASIC METABOLIC PANEL - Abnormal; Notable for the following components:   Sodium 134 (*)    Glucose, Bld 241 (*)    Creatinine, Ser 0.49 (*)    All other components within normal limits    EKG None  Radiology US Venous Img Lower Unilateral Right  Result Date: 04/12/2022 CLINICAL DATA:  Right lower extremity swelling and redness EXAM: Right LOWER EXTREMITY  VENOUS DOPPLER ULTRASOUND TECHNIQUE: Gray-scale sonography with graded compression, as well as color Doppler and duplex ultrasound were performed to evaluate the lower extremity deep venous systems from the level of the common femoral vein and including the common femoral, femoral, profunda femoral, popliteal and calf veins including the posterior tibial, peroneal and gastrocnemius veins when visible. The superficial great saphenous vein was also interrogated. Spectral Doppler was utilized to evaluate flow at rest and with distal augmentation maneuvers in the common femoral, femoral and popliteal veins. COMPARISON:  None Available. FINDINGS: Contralateral Common Femoral Vein: Respiratory phasicity is normal and symmetric with the symptomatic side. No evidence of thrombus. Normal compressibility. Common Femoral Vein: No evidence of thrombus. Normal compressibility, respiratory phasicity and response to augmentation. Saphenofemoral Junction: No evidence of thrombus. Normal compressibility and flow on color Doppler imaging. Profunda Femoral Vein: No evidence of thrombus. Normal compressibility and flow on color Doppler imaging. Femoral Vein: Positive for occlusive thrombus within the mid and distal femoral vein. Noncompressible. Popliteal Vein: Positive for occlusive thrombus.  Noncompressible. Calf Veins: Posterior tibial veins appear patent. Peroneal vein not visualized. Other  Findings:  Edema within the right lower leg IMPRESSION: Positive for acute occlusive DVT involving the femoral and popliteal veins. Critical Value/emergent results were called by telephone at the time of interpretation on 04/12/2022 at 9:40 pm to provider [facility] , who verbally acknowledged these results. Electronically Signed   By: Donavan Foil M.D.   On: 04/12/2022 21:41    Procedures Procedures    Medications Ordered in ED Medications - No data to display  ED Course/ Medical Decision Making/ A&P    Patient seen and examined. History obtained directly from patient.  I spent time reviewing recent hospitalization notes and discharge summaries.  Work-up including labs, imaging, EKG ordered in triage, if performed, were reviewed.    Labs/EKG: Independently reviewed and interpreted.  This included: CBC with differential with white blood cell count 9.6, hemoglobin 12.3 which continues to uptrend from first of the year, otherwise unremarkable; BMP glucose elevated at 241 with normal anion gap and bicarb, normal kidney function.  Imaging: Reviewed ultrasound results, positive for acute DVT in the femoral and popliteal veins on the right.  I had a long discussion in regards to these results with the patient and wife at bedside.  He has had issues with GI bleeding in the recent past, however has been doing well since his most recent hospitalization and has no active bleeding.  His hemoglobin has been recovering well.  We discussed risks and benefits of anticoagulation in the setting as well as risks of worsening DVT and pulmonary embolism.  After this discussion, patient agrees to initiate anticoagulation therapy.  He has close follow-up with his PCP and GI he will need to be involved with ongoing treatment.  Discussed with Dr. Maryan Rued who has seen patient.   Medications/Fluids: Ordered: Eliquis '10mg'$  PO.    Initial impression: Right lower extremity DVT, likely provoked by recent prolonged  hospitalization and surgery.  No evidence or suggestion of clinically significant PE at this time.  11:28 PM Reassessment performed. Patient appears stable.  Labs personally reviewed and interpreted including:  Imaging personally visualized and interpreted including:  Reviewed pertinent lab work and imaging with patient at bedside. Questions answered.   Most current vital signs reviewed and are as follows: BP (!) 128/92 (BP Location: Right Arm)   Pulse 94   Temp 99.2 F (37.3 C) (Oral)   Resp 14   SpO2 98%  Plan: Discharge to home.   Prescriptions written for: Eliquis  Other home care instructions discussed: Light exercise, PT/OT okay, avoid dangerous activities where he could potentially injure himself or hit his head.  ED return instructions discussed: Return with development of bleeding in the GI tract, with injury, or other concerns.  Follow-up instructions discussed: Patient encouraged to follow-up with their PCP in 3 days.                             Medical Decision Making Amount and/or Complexity of Data Reviewed Labs: ordered.   Patient with right lower extremity swelling over the past several weeks, positive for DVT.  No evidence of PE today.  Vital signs are reassuring.  No active GI bleeding.  Patient has had recent bleeding issues, however addressed by GI and by recent surgery.  He will be started on anticoagulation after discussion of risks and benefits and will follow-up with his outpatient team closely.  No indications for admission at this time.  The patient's vital signs, pertinent lab work and imaging were reviewed and interpreted as discussed in the ED course. Hospitalization was considered for further testing, treatments, or serial exams/observation. However as patient is well-appearing, has a stable exam, and reassuring studies today, I do not feel that they warrant admission at this time. This plan was discussed with the patient who verbalizes agreement  and comfort with this plan and seems reliable and able to return to the Emergency Department with worsening or changing symptoms.         Final Clinical Impression(s) / ED Diagnoses Final diagnoses:  Acute deep vein thrombosis (DVT) of popliteal vein of right lower extremity ([place])    Rx / DC Orders ED Discharge Orders          Ordered    APIXABAN (ELIQUIS) VTE STARTER PACK ('10MG'$  AND '5MG'$ )  Status:  Discontinued        04/12/22 2324    APIXABAN (ELIQUIS) VTE STARTER PACK ('10MG'$  AND '5MG'$ )        04/12/22 2331              Carlisle Cater, PA-C 04/12/22 2339    Blanchie Dessert, MD 04/13/22 0023

## 2022-04-12 NOTE — ED Triage Notes (Signed)
Swelling greater in right leg. Visiting nurse expressed concern and pt was advised be seen for [place]  Recent injury to leg Recent d/c from long hospitalization

## 2022-04-12 NOTE — Telephone Encounter (Signed)
Pt's right leg is 3 times as big as left leg. Still taking lasix every other day and elevating legs as recommended.  Please advise of further instructions.

## 2022-04-15 ENCOUNTER — Telehealth: Payer: Self-pay | Admitting: Pharmacy Technician

## 2022-04-15 NOTE — Telephone Encounter (Addendum)
Dr. [name], [facility] note: Patient will be scheduled as soon as possible.  Auth Submission: APPROVED Payer: MED[employer] Medication & CPT/J Code(s) submitted: Remicade (Infliximab) J1745 Route of submission (phone, fax, portal):  Phone # [phone] EXT: [phone] Fax [phone] Auth type: Buy/Bill Units/visits requested: 500MG  Q8WKS (3 DOSES) Reference number: [id number] Approval from: 04/11/22 to 10/10/22   Remicade co-pay card: approved Card id: [id number] Exp: 1/27 Cvv: [id number]  Patient id: [id number]

## 2022-04-16 ENCOUNTER — Other Ambulatory Visit: Payer: Self-pay

## 2022-04-16 ENCOUNTER — Encounter: Payer: Self-pay | Admitting: Gastroenterology

## 2022-04-16 ENCOUNTER — Other Ambulatory Visit: Payer: Self-pay | Admitting: Physical Medicine and Rehabilitation

## 2022-04-17 ENCOUNTER — Encounter: Payer: Self-pay | Admitting: Gastroenterology

## 2022-04-17 ENCOUNTER — Other Ambulatory Visit: Payer: Self-pay

## 2022-04-17 DIAGNOSIS — R600 Localized edema: Secondary | ICD-10-CM

## 2022-04-17 DIAGNOSIS — I824Y9 Acute embolism and thrombosis of unspecified deep veins of unspecified proximal lower extremity: Secondary | ICD-10-CM

## 2022-04-17 NOTE — Telephone Encounter (Signed)
Message sent to pt. See 04/05/22 mychart conversation.

## 2022-04-20 ENCOUNTER — Other Ambulatory Visit: Payer: Self-pay | Admitting: Physical Medicine and Rehabilitation

## 2022-04-22 ENCOUNTER — Telehealth: Payer: Self-pay

## 2022-04-22 ENCOUNTER — Encounter: Payer: Self-pay | Admitting: Family Medicine

## 2022-04-22 NOTE — Telephone Encounter (Signed)
Yes okay 

## 2022-04-22 NOTE — Telephone Encounter (Signed)
Please advise on v/o 

## 2022-04-22 NOTE — Telephone Encounter (Signed)
Patient was prescribed meds during hospital stay.  Does Dr. Anitra Lauth want him to continue these meds? If so, please call refills to [facility], [facility]  magnesium oxide (MAG-OX) 400 (240 Mg) MG tablet [[id number]]   pantoprazole (PROTONIX) 40 MG tablet [[id number]]

## 2022-04-22 NOTE — Telephone Encounter (Signed)
Caller/Agency: Sherris with [facility] Number: [phone] Requesting OT/PT/Skilled Nursing/Social Work/Speech Therapy: PT Frequency:  1x 1 week 1 time weekly for 3 weeks

## 2022-04-23 ENCOUNTER — Ambulatory Visit: Payer: Managed Care, Other (non HMO)

## 2022-04-23 VITALS — BP 124/73 | HR 94 | Temp 98.5°F | Resp 18 | Ht 70.0 in | Wt 222.6 lb

## 2022-04-23 DIAGNOSIS — K50118 Crohn's disease of large intestine with other complication: Secondary | ICD-10-CM

## 2022-04-23 DIAGNOSIS — K50119 Crohn's disease of large intestine with unspecified complications: Secondary | ICD-10-CM

## 2022-04-23 MED ORDER — SODIUM CHLORIDE 0.9 % IV SOLN
5.0000 mg/kg | Freq: Once | INTRAVENOUS | Status: AC
  Administered 2022-04-23: 500 mg via INTRAVENOUS
  Filled 2022-04-23: qty 50

## 2022-04-23 MED ORDER — DIPHENHYDRAMINE HCL 25 MG PO CAPS
25.0000 mg | ORAL_CAPSULE | Freq: Once | ORAL | Status: AC
  Administered 2022-04-23: 25 mg via ORAL
  Filled 2022-04-23: qty 1

## 2022-04-23 MED ORDER — PANTOPRAZOLE SODIUM 40 MG PO TBEC: 40.0000 mg | DELAYED_RELEASE_TABLET | Freq: Every day | ORAL | 1 refills | Status: DC

## 2022-04-23 MED ORDER — MAGNESIUM OXIDE -MG SUPPLEMENT 400 (240 MG) MG PO TABS: 800.0000 mg | ORAL_TABLET | Freq: Three times a day (TID) | ORAL | 1 refills | Status: DC

## 2022-04-23 MED ORDER — ACETAMINOPHEN 325 MG PO TABS
650.0000 mg | ORAL_TABLET | Freq: Once | ORAL | Status: AC
  Administered 2022-04-23: 650 mg via ORAL
  Filled 2022-04-23: qty 2

## 2022-04-23 MED ORDER — METHYLPREDNISOLONE SODIUM SUCC 40 MG IJ SOLR: 40.0000 mg | Freq: Once | INTRAMUSCULAR | Status: DC

## 2022-04-23 NOTE — Telephone Encounter (Signed)
Please advise if pt needs ED f/u?

## 2022-04-23 NOTE — Progress Notes (Signed)
Diagnosis: Crohn's Disease  Provider:  Marshell Garfinkel MD  Procedure: Infusion  IV Type: Peripheral, IV Location: R Hand  Remicade (Infliximab), Dose: 500 mg  Infusion Start Time: 1009  Infusion Stop Time: 1220  Post Infusion IV Care: Peripheral IV Discontinued  Discharge: Condition: Good, Destination: Home . AVS provided to patient.   Performed by:  Adelina Mings, LPN

## 2022-04-23 NOTE — Telephone Encounter (Signed)
Noted  

## 2022-04-23 NOTE — Telephone Encounter (Signed)
Okay, refills sent. Looks like he has an appointment with me set for 04/26/2022

## 2022-04-23 NOTE — Telephone Encounter (Signed)
Lvm with number provided for v/o.

## 2022-04-24 NOTE — Telephone Encounter (Signed)
LVM for return call. 

## 2022-04-25 ENCOUNTER — Ambulatory Visit: Payer: 59 | Admitting: Family Medicine

## 2022-04-25 NOTE — Telephone Encounter (Signed)
LVM for return call.  Note; if call returned, needs to speak with a CMA for verbal order approval

## 2022-04-26 ENCOUNTER — Encounter: Payer: Self-pay | Admitting: Family Medicine

## 2022-04-26 ENCOUNTER — Ambulatory Visit (INDEPENDENT_AMBULATORY_CARE_PROVIDER_SITE_OTHER): Payer: Managed Care, Other (non HMO) | Admitting: Family Medicine

## 2022-04-26 VITALS — BP 117/67 | HR 102 | Temp 98.6°F | Ht 70.0 in | Wt 222.0 lb

## 2022-04-26 DIAGNOSIS — E1165 Type 2 diabetes mellitus with hyperglycemia: Secondary | ICD-10-CM | POA: Diagnosis not present

## 2022-04-26 DIAGNOSIS — E1129 Type 2 diabetes mellitus with other diabetic kidney complication: Secondary | ICD-10-CM | POA: Diagnosis not present

## 2022-04-26 DIAGNOSIS — R197 Diarrhea, unspecified: Secondary | ICD-10-CM

## 2022-04-26 DIAGNOSIS — I82411 Acute embolism and thrombosis of right femoral vein: Secondary | ICD-10-CM

## 2022-04-26 DIAGNOSIS — R809 Proteinuria, unspecified: Secondary | ICD-10-CM

## 2022-04-26 DIAGNOSIS — Z794 Long term (current) use of insulin: Secondary | ICD-10-CM

## 2022-04-26 MED ORDER — INSULIN DEGLUDEC 100 UNIT/ML ~~LOC~~ SOPN: 22.0000 [IU] | PEN_INJECTOR | Freq: Every day | SUBCUTANEOUS | 0 refills | Status: DC

## 2022-04-26 MED ORDER — METFORMIN HCL 1000 MG PO TABS: 1000.0000 mg | ORAL_TABLET | Freq: Two times a day (BID) | ORAL | 3 refills | Status: DC

## 2022-04-26 MED ORDER — EMPAGLIFLOZIN 10 MG PO TABS: 10.0000 mg | ORAL_TABLET | Freq: Every day | ORAL | 0 refills | Status: DC

## 2022-04-26 NOTE — Progress Notes (Signed)
OFFICE VISIT  04/26/2022  CC:  Chief Complaint  Patient presents with   Medical Management of Chronic Issues    Patient is a 67 y.o. male who presents accompanied by his wife Richard [name] for follow-up recent DVT as well as follow-up debilitation due to recent prolonged hospitalization.  Also follow-up diabetes and hypertension.  INTERIM HX: He was having asymmetric right leg swelling and went to the ED on 04/12/2022 and was diagnosed with a DVT.  He was started on Eliquis. No problems with Eliquis.  Right leg still swollen significantly but overall he thinks both legs swelling has gone down a bit.  No longer taking Lasix because he felt like this was increasing his tendency to have a leakage from his rectum.  Thinking back, he notes that this may have started around the time he finished up his metronidazole.  He notes it to appear light yellow, not really fecal -appearing, not bloody. No anal pain. His colostomy output appears like brown soft stool.  He is on Remicade for his inflammatory bowel disease, most recent infusion was 3 days ago. Tapering off prednisone slowly, actually finishes this up in a few days.  This is improving a little bit gradually but still average 150 fasting and up in the 2 50-2 90 range in the afternoon and evening.  He is taking 20 units of Tresiba daily and metformin 1000 mg twice a day.  Has had some left hip pain focally.  There is a little red mark on the skin there. This pain has gradually improved and now he has some left low to mid back pain. No radicular pain, no paresthesias.  PMP AWARE reviewed today: most recent rx for methylphenidate CD20 milligrams was filled 03/04/2022, # 30, rx by me. No red flags.  Past Medical History:  Diagnosis Date   Adult ADHD    Allergic rhinitis    Anxiety    claustrophobic   Bronchopneumonia 11/03/2013   Colon polyps 2012 and 2013   All hyperplastic except one adenomatous w/out high grade dysplasia (recall 2023 per Dr.  Deatra Ina)   COVID-19 virus infection 09/29/2020   Elevated transaminase level 2017   Viral hep screening neg.  Suspect fatty liver.   History of pneumonia    Hyperlipidemia    Hypertension    Inflammatory bowel disease    01/2022   Large bowel perforation ([employer]) 02/2022   delayed, s/p colonoscopy w/biopsies (new dx crohn's/inflammatory BD)   Obesity    OSA on CPAP    Severe.  CPAP 10 cm H2O.  Follows Dr. Ander Slade.   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 [name] did surg   Right leg DVT ([place])    04/12/22 R fem and pop   Seborrheic dermatitis of scalp    and face: hydrocort 2% cream bid prn to face, and fluocinonide 0.5% sol'n to scalp bid prn per Dr. Allyson Sabal (04/2014)   Secondary male hypogonadism 04/2021   Type 2 diabetes mellitus with microalbuminuria ([place]) DM dx-09/27/2015. Microalb 2020     Past Surgical History:  Procedure Laterality Date   COLONOSCOPY W/ POLYPECTOMY  04/2010; 10/2011   Initial screening TCS showed 6 polyps (one adenomatous).  Pt says he returned for repeat TCS 10/2011 showed one hyperplastic polyp: Recall 10 yrs per Dr. Samuella Cota SIGMOIDOSCOPY N/A 02/25/2022   Procedure: FLEXIBLE SIGMOIDOSCOPY;  Surgeon: Irving Copas., MD;  Location: [facility];  Service: Gastroenterology;  Laterality: N/A;   FLEXIBLE SIGMOIDOSCOPY N/A 03/04/2022   Procedure:  FLEXIBLE SIGMOIDOSCOPY;  Surgeon: Lavena Bullion, DO;  Location: [facility];  Service: Gastroenterology;  Laterality: N/A;   HEMOSTASIS CONTROL  02/25/2022   Procedure: HEMOSTASIS CONTROL;  Surgeon: Irving Copas., MD;  Location: [facility];  Service: Gastroenterology;;   HEMOSTASIS CONTROL  03/04/2022   Procedure: HEMOSTASIS CONTROL;  Surgeon: Lavena Bullion, DO;  Location: [place];  Service: Gastroenterology;;   INCISION AND DRAINAGE ABSCESS N/A 11/07/2021   Procedure: Incision and drainage of scrotal abscess;  Surgeon: Leighton Ruff, MD;  Location: [facility];   Service: General;  Laterality: N/A;   IR SINUS/FIST TUBE CHK-NON GI  03/06/2022   LAPAROTOMY N/A 02/07/2022   Procedure: EXPLORATORY LAPAROTOMY, COLON RESECTION, OSTOMY;  Surgeon: Leighton Ruff, MD;  Location: [facility];  Service: General;  Laterality: N/A;   MOUTH SURGERY     TRANSTHORACIC ECHOCARDIOGRAM     03/05/22 NORMAL    Outpatient Medications Prior to Visit  Medication Sig Dispense Refill   Accu-Chek Softclix Lancets lancets SMARTSIG:Topical     acetaminophen (TYLENOL) 325 MG tablet Take 2 tablets (650 mg total) by mouth every 4 (four) hours as needed for mild pain, moderate pain, fever or headache.     APIXABAN (ELIQUIS) VTE STARTER PACK (10MG AND 5MG) Take as directed on package: start with two-71m tablets twice daily for 7 days. On day 8, switch to one-552mtablet twice daily. May substitute starter pack for 50m73mpixaban tablets if unavailable. 1 each 0   ascorbic acid (VITAMIN C) 500 MG tablet Take 1 tablet (500 mg total) by mouth 2 (two) times daily.     atorvastatin (LIPITOR) 40 MG tablet TAKE 1 TABLET BY MOUTH EVERY DAY 90 tablet 1   furosemide (LASIX) 20 MG tablet Take 1 tablet (20 mg total) by mouth every other day. 60 tablet 3   glucose blood (ACCU-CHEK GUIDE) test strip 1 each by Other route 4 (four) times daily -  before meals and at bedtime. 200 strip 0   Insulin Pen Needle (AURORA PEN NEEDLES) 31G X 6 MM MISC 1 Application by Does not apply route at bedtime. 100 each 0   magnesium oxide (MAG-OX) 400 (240 Mg) MG tablet Take 2 tablets (800 mg total) by mouth 3 (three) times daily. 180 tablet 1   Multiple Vitamin (MULTIVITAMIN WITH MINERALS) TABS tablet Take 1 tablet by mouth daily.     pantoprazole (PROTONIX) 40 MG tablet Take 1 tablet (40 mg total) by mouth daily. 90 tablet 1   predniSONE (DELTASONE) 10 MG tablet Take 3.5 tabs till Thursday. Starting 01/12 decrease to 3 tabs daily for a week. Decrease by 5 mg weekly till gone. 80 tablet 0   simethicone (MYLICON) 40 MG/99991111rops Take 1.2 mLs (80 mg total) by mouth 4 (four) times daily as needed for flatulence (PRN bloating, gassiness). 30 mL 0   thiamine (VITAMIN B-1) 100 MG tablet Take 1 tablet (100 mg total) by mouth daily.     zinc sulfate 220 (50 Zn) MG capsule Take 1 capsule (220 mg total) by mouth daily. 30 capsule 0   insulin degludec (TRESIBA) 100 UNIT/ML FlexTouch Pen Inject 20 Units into the skin at bedtime. 15 mL 0   metFORMIN (GLUCOPHAGE) 1000 MG tablet Take 1 tablet (1,000 mg total) by mouth 2 (two) times daily with a meal. 60 tablet 0   alum & mag hydroxide-simeth (MAALOX/MYLANTA) 200-200-20 MG/5ML suspension Take 30 mLs by mouth every 6 (six) hours as needed for indigestion or heartburn (or bloating). (Patient  not taking: Reported on 04/26/2022) 355 mL 0   diphenhydrAMINE (BENADRYL) 25 mg capsule Take 25 mg by mouth daily as needed for allergies. (Patient not taking: Reported on 04/26/2022)     docusate sodium (COLACE) 100 MG capsule Take 100 mg by mouth daily. (Patient not taking: Reported on 04/26/2022)     prochlorperazine (COMPAZINE) 5 MG tablet Take 1-2 tablets (5-10 mg total) by mouth every 6 (six) hours as needed for nausea. (Patient not taking: Reported on 04/26/2022) 30 tablet 0   hydrocortisone cream 0.5 % Apply topically 2 (two) times daily as needed for itching. (Patient not taking: Reported on 04/26/2022) 30 g 0   metroNIDAZOLE (FLAGYL) 500 MG tablet Take 1 tablet (500 mg total) by mouth every 12 (twelve) hours. (Patient not taking: Reported on 04/26/2022) 24 tablet 0   naphazoline-glycerin (CLEAR EYES REDNESS) 0.012-0.25 % SOLN Place 1-2 drops into both eyes 4 (four) times daily as needed for eye irritation. (Patient not taking: Reported on 03/28/2022)     polyethylene glycol (MIRALAX) 17 g packet Take 17 g by mouth daily. (Patient not taking: Reported on 04/26/2022) 30 each 0   No facility-administered medications prior to visit.    Allergies  Allergen Reactions   Lobster [Shellfish Allergy] Nausea  Only   Poison Ivy Extract Itching    Review of Systems As per HPI  PE:    04/26/2022    1:18 PM 04/23/2022   12:23 PM 04/23/2022   11:48 AM  Vitals with BMI  Height 5' 10"$     Weight 222 lbs    BMI [id number]    Systolic [id number]  Diastolic 67 73 87  Pulse [id number] 94 94   Physical Exam  Gen: Alert, well appearing.  Patient is oriented to person, place, time, and situation. AFFECT: pleasant, lucid thought and speech. LEGS: 4+ R LL pitting edema, violaceous hue anteriorly, small superficial ulceration with no abnormal drainage is present in pretibial region on R LL. L LL 2+ pitting mostly at ankle level.    Left greater trochanteric area with nickel sized pinkish discoloration that blanches.  This region is mildly tender to touch--it is located about 3 cm posteroinferior to the prominence of the greater trochanter. Back is nontender and without bruising.  LABS:  Last CBC Lab Results  Component Value Date   WBC 9.6 04/12/2022   HGB 12.3 (L) 04/12/2022   HCT 37.5 (L) 04/12/2022   MCV 96.4 04/12/2022   MCH 31.6 04/12/2022   RDW 14.8 04/12/2022   PLT 298 04/12/2022   Lab Results  Component Value Date   IRON 26 (L) 03/28/2022   TIBC 197 (L) 03/28/2022   FERRITIN 114 99991111   Last metabolic panel Lab Results  Component Value Date   GLUCOSE 241 (H) 04/12/2022   NA 134 (L) 04/12/2022   K 4.1 04/12/2022   CL 101 04/12/2022   CO2 26 04/12/2022   BUN 14 04/12/2022   CREATININE 0.49 (L) 04/12/2022   GFRNONAA >60 04/12/2022   CALCIUM 9.0 04/12/2022   PHOS 2.3 (L) 03/01/2022   PROT 6.7 04/04/2022   ALBUMIN 3.4 (L) 04/04/2022   BILITOT 0.5 04/04/2022   ALKPHOS 82 04/04/2022   AST 13 04/04/2022   ALT 12 04/04/2022   ANIONGAP 7 04/12/2022   Last lipids Lab Results  Component Value Date   CHOL 109 07/12/2021   HDL 54.60 07/12/2021   LDLCALC 34 07/12/2021   LDLDIRECT 139.9 06/13/2011   TRIG 144 02/21/2022   CHOLHDL  2 07/12/2021   Last hemoglobin A1c Lab Results   Component Value Date   HGBA1C 6.1 (H) 02/28/2022   IMPRESSION AND PLAN:  #1 right leg DVT, provoked. He will need Eliquis x 3 months (estimated stop date 07/17/22). Expect gradual resolution of right leg swelling, with the possibility of some chronic swelling.  #2 diabetes, elevated glucoses.  Gradually improving, especially as he tapers off prednisone. However, will need to continue with [place] and actually increase the dose to 22 units a day.  Also will restart Jardiance 10 mg a day.  Continue metformin 1000 mg twice a day.  3.  Anal leakage.  He describes what sounds like sloughing of the superficial mucosa. Will obtain sample of this for C. difficile testing.  #4 left lateral hip pain. Essentially this is over the area of the greater trochanter.  Discussed trochanteric bursitis and some secondary musculoskeletal low back pain due to altered gait. Continue Tylenol and observe.  #5 inflammatory bowel disease. Continue Remicade and prednisone taper as per GI.  An After Visit Summary was printed and given to the patient.  FOLLOW UP: Return in about 4 weeks (around 05/24/2022) for routine chronic illness f/u.  Signed:  Crissie Sickles, MD           04/26/2022

## 2022-04-26 NOTE — Patient Instructions (Addendum)
Restart Jardiance 10 mg daily

## 2022-05-01 ENCOUNTER — Ambulatory Visit (INDEPENDENT_AMBULATORY_CARE_PROVIDER_SITE_OTHER): Payer: Managed Care, Other (non HMO) | Admitting: Vascular Surgery

## 2022-05-01 ENCOUNTER — Encounter: Payer: Self-pay | Admitting: Vascular Surgery

## 2022-05-01 VITALS — BP 110/62 | HR 105 | Resp 20 | Ht 70.0 in | Wt 220.0 lb

## 2022-05-01 DIAGNOSIS — M7989 Other specified soft tissue disorders: Secondary | ICD-10-CM

## 2022-05-01 DIAGNOSIS — I82411 Acute embolism and thrombosis of right femoral vein: Secondary | ICD-10-CM

## 2022-05-01 MED ORDER — APIXABAN 5 MG PO TABS: 5.0000 mg | ORAL_TABLET | Freq: Two times a day (BID) | ORAL | 0 refills | Status: DC

## 2022-05-01 NOTE — Progress Notes (Signed)
Patient ID: Richard [name], male   DOB: 04-22-1955, 67 y.o.   MRN: [id number]  Reason for Consult: New Patient (Initial Visit)   Referred by [name], MD  Subjective:     HPI:  Richard [name] is a 67 y.o. male recently admitted to the hospital for 7 weeks and ultimately diagnosed with Crohn's disease.  Upon discharge he was noted to have significant swelling of the right lower extremity that did not resolve although the other extremity edema did resolve.  He has since been diagnosed with extensive right lower extremity DVT and has been started on Eliquis which she has been on for about 2 weeks.  No previous personal or family history of DVT.  States that the swelling is much improved discoloration of the skin was present prior to the DVT and is also improving now since taking Eliquis.  He has not been wearing compression stockings.  Did not previously take aspirin.  Past Medical History:  Diagnosis Date   Adult ADHD    Allergic rhinitis    Anxiety    claustrophobic   Bronchopneumonia 11/03/2013   Colon polyps 2012 and 2013   All hyperplastic except one adenomatous w/out high grade dysplasia (recall 2023 per Dr. Deatra Ina)   COVID-19 virus infection 09/29/2020   Elevated transaminase level 2017   Viral hep screening neg.  Suspect fatty liver.   History of pneumonia    Hyperlipidemia    Hypertension    Inflammatory bowel disease    01/2022   Large bowel perforation ([place]) 02/2022   delayed, s/p colonoscopy w/biopsies (new dx crohn's/inflammatory BD)   Obesity    OSA on CPAP    Severe.  CPAP 10 cm H2O.  Follows Dr. Ander Slade.   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 [name] did surg   Right leg DVT ([place])    04/12/22 R fem and pop   Seborrheic dermatitis of scalp    and face: hydrocort 2% cream bid prn to face, and fluocinonide 0.5% sol'n to scalp bid prn per Dr. Allyson Sabal (04/2014)   Secondary male hypogonadism 04/2021   Type 2 diabetes mellitus with  microalbuminuria ([place]) DM dx-09/27/2015. Microalb 2020   Family History  Problem Relation Age of Onset   Lung cancer Father    Cancer Father        nasal   Allergies Father    Colon cancer Neg Hx    Esophageal cancer Neg Hx    Rectal cancer Neg Hx    Stomach cancer Neg Hx    Past Surgical History:  Procedure Laterality Date   COLONOSCOPY W/ POLYPECTOMY  04/2010; 10/2011   Initial screening TCS showed 6 polyps (one adenomatous).  Pt says he returned for repeat TCS 10/2011 showed one hyperplastic polyp: Recall 10 yrs per Dr. Samuella Cota SIGMOIDOSCOPY N/A 02/25/2022   Procedure: FLEXIBLE SIGMOIDOSCOPY;  Surgeon: Irving Copas., MD;  Location: [facility];  Service: Gastroenterology;  Laterality: N/A;   FLEXIBLE SIGMOIDOSCOPY N/A 03/04/2022   Procedure: FLEXIBLE SIGMOIDOSCOPY;  Surgeon: Lavena Bullion, DO;  Location: [place];  Service: Gastroenterology;  Laterality: N/A;   HEMOSTASIS CONTROL  02/25/2022   Procedure: HEMOSTASIS CONTROL;  Surgeon: Irving Copas., MD;  Location: [facility];  Service: Gastroenterology;;   HEMOSTASIS CONTROL  03/04/2022   Procedure: HEMOSTASIS CONTROL;  Surgeon: Lavena Bullion, DO;  Location: [place];  Service: Gastroenterology;;   INCISION AND DRAINAGE ABSCESS N/A 11/07/2021   Procedure: Incision and drainage of  scrotal abscess;  Surgeon: Leighton Ruff, MD;  Location: [facility];  Service: General;  Laterality: N/A;   IR SINUS/FIST TUBE CHK-NON GI  03/06/2022   LAPAROTOMY N/A 02/07/2022   Procedure: EXPLORATORY LAPAROTOMY, COLON RESECTION, OSTOMY;  Surgeon: Leighton Ruff, MD;  Location: [facility];  Service: General;  Laterality: N/A;   MOUTH SURGERY     TRANSTHORACIC ECHOCARDIOGRAM     03/05/22 NORMAL    Short Social History:  Social History   Tobacco Use   Smoking status: Former    Packs/day: 1.00    Types: Cigarettes    Quit date: 07/17/2002    Years since quitting: 19.8   Smokeless tobacco: Never  Substance  Use Topics   Alcohol use: Yes    Alcohol/week: 7.0 standard drinks of alcohol    Types: 7 Standard drinks or equivalent per week    Comment: wine every night with dinner    Allergies  Allergen Reactions   Lobster [Shellfish Allergy] Nausea Only   Poison Ivy Extract Itching    Current Outpatient Medications  Medication Sig Dispense Refill   Accu-Chek Softclix Lancets lancets SMARTSIG:Topical     acetaminophen (TYLENOL) 325 MG tablet Take 2 tablets (650 mg total) by mouth every 4 (four) hours as needed for mild pain, moderate pain, fever or headache.     APIXABAN (ELIQUIS) VTE STARTER PACK (10MG AND 5MG) Take as directed on package: start with two-10m tablets twice daily for 7 days. On day 8, switch to one-552mtablet twice daily. May substitute starter pack for 61m59mpixaban tablets if unavailable. 1 each 0   ascorbic acid (VITAMIN C) 500 MG tablet Take 1 tablet (500 mg total) by mouth 2 (two) times daily.     atorvastatin (LIPITOR) 40 MG tablet TAKE 1 TABLET BY MOUTH EVERY DAY 90 tablet 1   empagliflozin (JARDIANCE) 10 MG TABS tablet Take 1 tablet (10 mg total) by mouth daily before breakfast. 30 tablet 0   furosemide (LASIX) 20 MG tablet Take 1 tablet (20 mg total) by mouth every other day. 60 tablet 3   glucose blood (ACCU-CHEK GUIDE) test strip 1 each by Other route 4 (four) times daily -  before meals and at bedtime. 200 strip 0   insulin degludec (TRESIBA) 100 UNIT/ML FlexTouch Pen Inject 22 Units into the skin at bedtime. 15 mL 0   Insulin Pen Needle (AURORA PEN NEEDLES) 31G X 6 MM MISC 1 Application by Does not apply route at bedtime. 100 each 0   magnesium oxide (MAG-OX) 400 (240 Mg) MG tablet Take 2 tablets (800 mg total) by mouth 3 (three) times daily. 180 tablet 1   metFORMIN (GLUCOPHAGE) 1000 MG tablet Take 1 tablet (1,000 mg total) by mouth 2 (two) times daily with a meal. 180 tablet 3   Multiple Vitamin (MULTIVITAMIN WITH MINERALS) TABS tablet Take 1 tablet by mouth daily.      pantoprazole (PROTONIX) 40 MG tablet Take 1 tablet (40 mg total) by mouth daily. 90 tablet 1   predniSONE (DELTASONE) 10 MG tablet Take 3.5 tabs till Thursday. Starting 01/12 decrease to 3 tabs daily for a week. Decrease by 5 mg weekly till gone. 80 tablet 0   prochlorperazine (COMPAZINE) 5 MG tablet Take 1-2 tablets (5-10 mg total) by mouth every 6 (six) hours as needed for nausea. 30 tablet 0   simethicone (MYLICON) 40 MG/99991111ops Take 1.2 mLs (80 mg total) by mouth 4 (four) times daily as needed for flatulence (PRN bloating, gassiness). 30 mL  0   thiamine (VITAMIN B-1) 100 MG tablet Take 1 tablet (100 mg total) by mouth daily.     zinc sulfate 220 (50 Zn) MG capsule Take 1 capsule (220 mg total) by mouth daily. 30 capsule 0   alum & mag hydroxide-simeth (MAALOX/MYLANTA) 200-200-20 MG/5ML suspension Take 30 mLs by mouth every 6 (six) hours as needed for indigestion or heartburn (or bloating). (Patient not taking: Reported on 04/26/2022) 355 mL 0   diphenhydrAMINE (BENADRYL) 25 mg capsule Take 25 mg by mouth daily as needed for allergies. (Patient not taking: Reported on 04/26/2022)     docusate sodium (COLACE) 100 MG capsule Take 100 mg by mouth daily. (Patient not taking: Reported on 04/26/2022)     No current facility-administered medications for this visit.    Review of Systems  Constitutional: Positive for fatigue and unexpected weight change.  HENT: HENT negative.  Eyes: Eyes negative.  Cardiovascular: Positive for leg swelling.  Musculoskeletal: Musculoskeletal negative.  Skin: Skin negative.  Neurological: Neurological negative. Hematologic: Hematologic/lymphatic negative.  Psychiatric: Psychiatric negative.        Objective:  Objective   Vitals:   05/01/22 1429  BP: 110/62  Pulse: (!) 105  Resp: 20  Weight: 220 lb (99.8 kg)  Height: 5' 10"$  (1.778 m)   Body mass index is 31.57 kg/m.  Physical Exam HENT:     Head: Normocephalic.     Nose: Nose normal.  Eyes:      Pupils: Pupils are equal, round, and reactive to light.  Abdominal:     General: Abdomen is flat.     Comments: Colostomy bag  Musculoskeletal:     Right lower leg: Edema present.     Left lower leg: No edema.  Skin:    Capillary Refill: Capillary refill takes less than 2 seconds.  Neurological:     General: No focal deficit present.     Mental Status: He is alert.  Psychiatric:        Mood and Affect: Mood normal.        Thought Content: Thought content normal.     Data: Right LOWER EXTREMITY VENOUS DOPPLER ULTRASOUND   TECHNIQUE: Gray-scale sonography with graded compression, as well as color Doppler and duplex ultrasound were performed to evaluate the lower extremity deep venous systems from the level of the common femoral vein and including the common femoral, femoral, profunda femoral, popliteal and calf veins including the posterior tibial, peroneal and gastrocnemius veins when visible. The superficial great saphenous vein was also interrogated. Spectral Doppler was utilized to evaluate flow at rest and with distal augmentation maneuvers in the common femoral, femoral and popliteal veins.   COMPARISON:  None Available.   FINDINGS: Contralateral Common Femoral Vein: Respiratory phasicity is normal and symmetric with the symptomatic side. No evidence of thrombus. Normal compressibility.   Common Femoral Vein: No evidence of thrombus. Normal compressibility, respiratory phasicity and response to augmentation.   Saphenofemoral Junction: No evidence of thrombus. Normal compressibility and flow on color Doppler imaging.   Profunda Femoral Vein: No evidence of thrombus. Normal compressibility and flow on color Doppler imaging.   Femoral Vein: Positive for occlusive thrombus within the mid and distal femoral vein. Noncompressible.   Popliteal Vein: Positive for occlusive thrombus.  Noncompressible.   Calf Veins: Posterior tibial veins appear patent. Peroneal vein  not visualized.   Other Findings:  Edema within the right lower leg   IMPRESSION: Positive for acute occlusive DVT involving the femoral and popliteal veins.  Assessment/Plan:    67 year old male with recent significant size DVT of the right lower extremity after long hospitalization with diagnosis of Crohn's disease.  He has risk factors of age, Crohn's disease and hospitalization.  Given that this is his first DVT unprovoked he merits 3 months of anticoagulation as long as he does not require any interval hospitalization.  I have refilled his Eliquis for total 3 months and have recommended 81 mg aspirin after cessation of Eliquis.  We have fit him for knee-high compression stockings today and he can see me on an as-needed basis.     Waynetta Sandy MD [facility]

## 2022-05-09 ENCOUNTER — Telehealth: Payer: Self-pay | Admitting: Family Medicine

## 2022-05-09 ENCOUNTER — Other Ambulatory Visit: Payer: Self-pay | Admitting: Family Medicine

## 2022-05-09 DIAGNOSIS — E1165 Type 2 diabetes mellitus with hyperglycemia: Secondary | ICD-10-CM

## 2022-05-09 MED ORDER — ACCU-CHEK SOFTCLIX LANCETS MISC: 5 refills | Status: AC

## 2022-05-09 MED ORDER — AURORA PEN NEEDLES 31G X 6 MM MISC: 1.0000 | Freq: Every day | 5 refills | Status: DC

## 2022-05-09 MED ORDER — ACCU-CHEK GUIDE VI STRP: 1.0000 | ORAL_STRIP | Freq: Three times a day (TID) | 5 refills | Status: AC

## 2022-05-09 NOTE — Addendum Note (Signed)
Addended by: Deveron Furlong D on: 05/09/2022 04:33 PM   Modules accepted: Orders

## 2022-05-09 NOTE — Telephone Encounter (Signed)
Pt advised refills sent. °

## 2022-05-09 NOTE — Telephone Encounter (Signed)
Richard [name] is needing additional Accu Chek Lancets and accu chek test strips.  He also needs more pen needles. He says if he can have 300 of each that would be great. His pharmacy is [facility]

## 2022-05-10 ENCOUNTER — Encounter: Payer: Self-pay | Admitting: Family Medicine

## 2022-05-13 ENCOUNTER — Telehealth: Payer: Self-pay | Admitting: Family Medicine

## 2022-05-13 NOTE — Telephone Encounter (Signed)
Returned call to patient. Pt sates that his symptoms seemed to be better after he got the Remicade infusion early February. He states that for the last 3-4 weeks when he goes to urinate he is passing small amounts of diarrhea per rectum. Pt states that there is blood in the stool, he describes as "a darker red". 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 He states that the first time he wipes there is stool and then the second time he notices BRB. Pt states that he did not really see any mucous, but he saw little pieces of something in his stool, he did not do any further investigation. His PCP ordered a stool test to r/o C. Diff, pt submitted this today. He also reports intermittent lower abdominal pain. He can't tell me wether it is a crampy or sharp pain. I advised pt that you may want to see what the stool study shows first, but will send to you for other recommendations in the interim.

## 2022-05-13 NOTE — Telephone Encounter (Signed)
Noted  

## 2022-05-13 NOTE — Telephone Encounter (Signed)
Inbound call from patient stating he has severe diarrhea and discomfort.  Please give a call to further advise.  Thank you

## 2022-05-13 NOTE — Telephone Encounter (Signed)
Richard [name] with [name] had a fall last week wednes 05/08/22. He was reported he fell by pushing a wheelchair using it as a walker. Richard [name] reports that [name] did not seek any medical treatment for the fall and is doing fine after that fall. He was just reporting this as a FYI.

## 2022-05-13 NOTE — Telephone Encounter (Signed)
FYI  Please see below

## 2022-05-14 ENCOUNTER — Telehealth: Payer: Self-pay

## 2022-05-14 ENCOUNTER — Other Ambulatory Visit (INDEPENDENT_AMBULATORY_CARE_PROVIDER_SITE_OTHER): Payer: Managed Care, Other (non HMO)

## 2022-05-14 DIAGNOSIS — K50119 Crohn's disease of large intestine with unspecified complications: Secondary | ICD-10-CM

## 2022-05-14 LAB — SEDIMENTATION RATE: Sed Rate: 82 mm/hr — ABNORMAL HIGH (ref 0–20)

## 2022-05-14 LAB — CLOSTRIDIUM DIFFICILE TOXIN B, QUALITATIVE, REAL-TIME PCR: Toxigenic C. Difficile by PCR: NOT DETECTED

## 2022-05-14 LAB — C-REACTIVE PROTEIN: CRP: 15.3 mg/dL (ref 0.5–20.0)

## 2022-05-14 NOTE — Telephone Encounter (Signed)
Patient dropped off stool sample yesterday.  He wants to know what are the name tests being ran.  Gastro doctor wants patient to do stool sample and does not want to be duplicating tests.  Please call [phone]

## 2022-05-14 NOTE — Telephone Encounter (Signed)
Patient called stating that he had some questions about the stool samples he is needing to take. Patient is wanting to know if this is the same stool sample that was taken by his PCP yesterday and is also wanting to know if the sample needs to be taken from his rectum or ostomy bag. Requesting a call back to discuss. Please advise.

## 2022-05-14 NOTE — Telephone Encounter (Signed)
Called and spoke with patient regarding Dr. Vivia Ewing recommendations as outlined below. Pt will stop by the lab in the basement of our office building at his convenience to complete lab work and pick up additional stool kits. Pt is aware that we will be in touch with additional recommendations once results return. Pt is also aware that we will check infliximab trough and antibody level 1 week prior to his infusion on 06/18/22. Pt states that he saw Dr. [name] in 04/2022 and he also has an appt to see her next week. Pt verbalized understanding of recommendations and had no concerns at the end of the call.  Stool studies and lab orders in epic. Infliximab trough and antibody level reminder in epic.

## 2022-05-14 NOTE — Telephone Encounter (Signed)
Fecal calprotectin Gi profile PCR

## 2022-05-14 NOTE — Addendum Note (Signed)
Addended by: Yevette Edwards on: 05/14/2022 11:43 AM   Modules accepted: Orders

## 2022-05-14 NOTE — Telephone Encounter (Signed)
Called and spoke with patient. I informed patient that the stool studies that we ordered are different than what his PCP ordered. I advised patient to collect stool sample from rectum. Pt understands that once we have all results back Dr. [name] will further advise. Pt verbalized understanding of information and had no concerns at the end of the call.

## 2022-05-15 NOTE — Telephone Encounter (Signed)
Pt notified of lab results via MyChart. 

## 2022-05-15 NOTE — Telephone Encounter (Signed)
MyChart message read.

## 2022-05-20 ENCOUNTER — Encounter: Payer: Self-pay | Admitting: Gastroenterology

## 2022-05-20 ENCOUNTER — Telehealth: Payer: Self-pay | Admitting: Gastroenterology

## 2022-05-20 NOTE — Telephone Encounter (Signed)
Patient is calling to check up on lab results. Please advise

## 2022-05-20 NOTE — Telephone Encounter (Signed)
Returned call to patient. Reviewed message that Dr. [name] sent to patient via [place]. Pt states that he has not been able to produce enough stool for the additional stool samples, but he will try his best to get those in. I informed patient that this was fine and we will be in touch with results. Pt verbalized understanding and had no concerns at the end of the call.

## 2022-05-24 ENCOUNTER — Ambulatory Visit: Payer: Managed Care, Other (non HMO) | Admitting: Family Medicine

## 2022-05-24 ENCOUNTER — Encounter: Payer: Self-pay | Admitting: Family Medicine

## 2022-05-24 ENCOUNTER — Ambulatory Visit (INDEPENDENT_AMBULATORY_CARE_PROVIDER_SITE_OTHER): Payer: Managed Care, Other (non HMO) | Admitting: Family Medicine

## 2022-05-24 VITALS — BP 106/70 | HR 106 | Temp 99.7°F | Ht 70.0 in | Wt 220.4 lb

## 2022-05-24 DIAGNOSIS — R809 Proteinuria, unspecified: Secondary | ICD-10-CM | POA: Diagnosis not present

## 2022-05-24 DIAGNOSIS — K50118 Crohn's disease of large intestine with other complication: Secondary | ICD-10-CM | POA: Diagnosis not present

## 2022-05-24 DIAGNOSIS — E1129 Type 2 diabetes mellitus with other diabetic kidney complication: Secondary | ICD-10-CM

## 2022-05-24 LAB — POCT GLYCOSYLATED HEMOGLOBIN (HGB A1C)
HbA1c POC (<> result, manual entry): 7 % (ref 4.0–5.6)
HbA1c, POC (controlled diabetic range): 7 % (ref 0.0–7.0)
HbA1c, POC (prediabetic range): 7 % — AB (ref 5.7–6.4)
Hemoglobin A1C: 7 % — AB (ref 4.0–5.6)

## 2022-05-24 MED ORDER — PREDNISONE 10 MG PO TABS: ORAL_TABLET | ORAL | 0 refills | Status: DC

## 2022-05-24 NOTE — Progress Notes (Unsigned)
OFFICE VISIT  05/24/2022  CC:  Chief Complaint  Patient presents with   Medical Management of Chronic Issues    Last A1c 10/11/21 (7.2)    Patient is a 67 y.o. male who presents accompanied by his wife for 1 month follow-up DVT, diabetes, left lateral hip pain. A/P as of last visit: "1 right leg DVT, provoked. He will need Eliquis x 3 months (estimated stop date 07/17/22). Expect gradual resolution of right leg swelling, with the possibility of some chronic swelling.   #2 diabetes, elevated glucoses.  Gradually improving, especially as he tapers off prednisone. However, will need to continue with [place] and actually increase the dose to 22 units a day.  Also will restart Jardiance 10 mg a day.  Continue metformin 1000 mg twice a day.   3.  Anal leakage.  He describes what sounds like sloughing of the superficial mucosa. Will obtain sample of this for C. difficile testing.   #4 left lateral hip pain. Essentially this is over the area of the greater trochanter.  Discussed trochanteric bursitis and some secondary musculoskeletal low back pain due to altered gait. Continue Tylenol and observe.   #5 inflammatory bowel disease. Continue Remicade and prednisone taper as per GI."  INTERIM HX: ***  Past Medical History:  Diagnosis Date   Adult ADHD    Allergic rhinitis    Anxiety    claustrophobic   Bronchopneumonia 11/03/2013   Colon polyps 2012 and 2013   All hyperplastic except one adenomatous w/out high grade dysplasia (recall 2023 per Dr. Deatra Ina)   COVID-19 virus infection 09/29/2020   Elevated transaminase level 2017   Viral hep screening neg.  Suspect fatty liver.   History of pneumonia    Hyperlipidemia    Hypertension    Inflammatory bowel disease    01/2022   Large bowel perforation ([place]) 02/2022   delayed, s/p colonoscopy w/biopsies (new dx crohn's/inflammatory BD)   Obesity    OSA on CPAP    Severe.  CPAP 10 cm H2O.  Follows Dr. Ander Slade.   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 [name] did surg   Right leg DVT ([place])    04/12/22 R fem and pop   Seborrheic dermatitis of scalp    and face: hydrocort 2% cream bid prn to face, and fluocinonide 0.5% sol'n to scalp bid prn per Dr. Allyson Sabal (04/2014)   Secondary male hypogonadism 04/2021   Type 2 diabetes mellitus with microalbuminuria ([place]) DM dx-09/27/2015. Microalb 2020    Past Surgical History:  Procedure Laterality Date   COLONOSCOPY W/ POLYPECTOMY  04/2010; 10/2011   Initial screening TCS showed 6 polyps (one adenomatous).  Pt says he returned for repeat TCS 10/2011 showed one hyperplastic polyp: Recall 10 yrs per Dr. Samuella Cota SIGMOIDOSCOPY N/A 02/25/2022   Procedure: FLEXIBLE SIGMOIDOSCOPY;  Surgeon: Irving Copas., MD;  Location: [facility];  Service: Gastroenterology;  Laterality: N/A;   FLEXIBLE SIGMOIDOSCOPY N/A 03/04/2022   Procedure: FLEXIBLE SIGMOIDOSCOPY;  Surgeon: Lavena Bullion, DO;  Location: [facility];  Service: Gastroenterology;  Laterality: N/A;   HEMOSTASIS CONTROL  02/25/2022   Procedure: HEMOSTASIS CONTROL;  Surgeon: Irving Copas., MD;  Location: [facility];  Service: Gastroenterology;;   HEMOSTASIS CONTROL  03/04/2022   Procedure: HEMOSTASIS CONTROL;  Surgeon: Lavena Bullion, DO;  Location: [facility];  Service: Gastroenterology;;   INCISION AND DRAINAGE ABSCESS N/A 11/07/2021   Procedure: Incision and drainage of scrotal abscess;  Surgeon: Leighton Ruff, MD;  Location:  [facility];  Service: General;  Laterality: N/A;   IR SINUS/FIST TUBE CHK-NON GI  03/06/2022   LAPAROTOMY N/A 02/07/2022   Procedure: EXPLORATORY LAPAROTOMY, COLON RESECTION, OSTOMY;  Surgeon: Leighton Ruff, MD;  Location: [facility];  Service: General;  Laterality: N/A;   MOUTH SURGERY     TRANSTHORACIC ECHOCARDIOGRAM     03/05/22 NORMAL    Outpatient Medications Prior to Visit  Medication Sig Dispense Refill   Accu-Chek Softclix Lancets lancets  SMARTSIG:Topical 200 each 5   acetaminophen (TYLENOL) 325 MG tablet Take 2 tablets (650 mg total) by mouth every 4 (four) hours as needed for mild pain, moderate pain, fever or headache.     apixaban (ELIQUIS) 5 MG TABS tablet Take 1 tablet (5 mg total) by mouth 2 (two) times daily. 120 tablet 0   ascorbic acid (VITAMIN C) 500 MG tablet Take 1 tablet (500 mg total) by mouth 2 (two) times daily.     atorvastatin (LIPITOR) 40 MG tablet TAKE 1 TABLET BY MOUTH EVERY DAY 90 tablet 1   empagliflozin (JARDIANCE) 10 MG TABS tablet Take 1 tablet (10 mg total) by mouth daily before breakfast. 30 tablet 0   furosemide (LASIX) 20 MG tablet Take 1 tablet (20 mg total) by mouth every other day. 60 tablet 3   glucose blood (ACCU-CHEK GUIDE) test strip 1 each by Other route 4 (four) times daily -  before meals and at bedtime. 300 strip 5   insulin degludec (TRESIBA) 100 UNIT/ML FlexTouch Pen Inject 22 Units into the skin at bedtime. 15 mL 0   Insulin Pen Needle (AURORA PEN NEEDLES) 31G X 6 MM MISC 1 Application by Does not apply route at bedtime. 100 each 5   magnesium oxide (MAG-OX) 400 (240 Mg) MG tablet Take 2 tablets (800 mg total) by mouth 3 (three) times daily. 180 tablet 1   metFORMIN (GLUCOPHAGE) 1000 MG tablet Take 1 tablet (1,000 mg total) by mouth 2 (two) times daily with a meal. 180 tablet 3   Multiple Vitamin (MULTIVITAMIN WITH MINERALS) TABS tablet Take 1 tablet by mouth daily.     pantoprazole (PROTONIX) 40 MG tablet Take 1 tablet (40 mg total) by mouth daily. 90 tablet 1   predniSONE (DELTASONE) 10 MG tablet Take 3.5 tabs till Thursday. Starting 01/12 decrease to 3 tabs daily for a week. Decrease by 5 mg weekly till gone. 80 tablet 0   prochlorperazine (COMPAZINE) 5 MG tablet Take 1-2 tablets (5-10 mg total) by mouth every 6 (six) hours as needed for nausea. 30 tablet 0   simethicone (MYLICON) 40 99991111 drops Take 1.2 mLs (80 mg total) by mouth 4 (four) times daily as needed for flatulence (PRN  bloating, gassiness). 30 mL 0   thiamine (VITAMIN B-1) 100 MG tablet Take 1 tablet (100 mg total) by mouth daily.     zinc sulfate 220 (50 Zn) MG capsule Take 1 capsule (220 mg total) by mouth daily. 30 capsule 0   alum & mag hydroxide-simeth (MAALOX/MYLANTA) 200-200-20 MG/5ML suspension Take 30 mLs by mouth every 6 (six) hours as needed for indigestion or heartburn (or bloating). (Patient not taking: Reported on 04/26/2022) 355 mL 0   diphenhydrAMINE (BENADRYL) 25 mg capsule Take 25 mg by mouth daily as needed for allergies. (Patient not taking: Reported on 04/26/2022)     docusate sodium (COLACE) 100 MG capsule Take 100 mg by mouth daily. (Patient not taking: Reported on 04/26/2022)     APIXABAN (ELIQUIS) VTE STARTER PACK ('10MG'$  AND  $'5MG'p$ ) Take as directed on package: start with two-'5mg'$  tablets twice daily for 7 days. On day 8, switch to one-'5mg'$  tablet twice daily. May substitute starter pack for '5mg'$  apixaban tablets if unavailable. (Patient not taking: Reported on 05/24/2022) 1 each 0   No facility-administered medications prior to visit.    Allergies  Allergen Reactions   Lobster [Shellfish Allergy] Nausea Only   Poison Ivy Extract Itching    Review of Systems As per HPI  PE:    05/24/2022    3:22 PM 05/01/2022    2:29 PM 04/26/2022    1:18 PM  Vitals with BMI  Height '5\' 10"'$  '5\' 10"'$  '5\' 10"'$   Weight 220 lbs 6 oz 220 lbs 222 lbs  BMI 31.62 [id number]  Systolic A999333 A999333 [id number]  Diastolic 70 62 67  Pulse A999333 105 102   Physical Exam  ***  LABS:  Last CBC Lab Results  Component Value Date   WBC 9.6 04/12/2022   HGB 12.3 (L) 04/12/2022   HCT 37.5 (L) 04/12/2022   MCV 96.4 04/12/2022   MCH 31.6 04/12/2022   RDW 14.8 04/12/2022   PLT 298 04/12/2022   Lab Results  Component Value Date   IRON 26 (L) 03/28/2022   TIBC 197 (L) 03/28/2022   FERRITIN 114 99991111   Last metabolic panel Lab Results  Component Value Date   GLUCOSE 241 (H) 04/12/2022   NA 134 (L) 04/12/2022   K 4.1  04/12/2022   CL 101 04/12/2022   CO2 26 04/12/2022   BUN 14 04/12/2022   CREATININE 0.49 (L) 04/12/2022   GFRNONAA >60 04/12/2022   CALCIUM 9.0 04/12/2022   PHOS 2.3 (L) 03/01/2022   PROT 6.7 04/04/2022   ALBUMIN 3.4 (L) 04/04/2022   BILITOT 0.5 04/04/2022   ALKPHOS 82 04/04/2022   AST 13 04/04/2022   ALT 12 04/04/2022   ANIONGAP 7 04/12/2022   Last hemoglobin A1c Lab Results  Component Value Date   HGBA1C 7.0 (A) 05/24/2022   HGBA1C 7.0 05/24/2022   HGBA1C 7.0 (A) 05/24/2022   HGBA1C 7.0 05/24/2022   IMPRESSION AND PLAN:  No problem-specific Assessment & Plan notes found for this encounter.  right leg DVT, provoked. He will need Eliquis x 3 months (estimated stop date 07/17/22). Expect gradual resolution of right leg swelling, with the possibility of some chronic swelling. An After Visit Summary was printed and given to the patient.  FOLLOW UP: No follow-ups on file.  Signed:  Crissie Sickles, MD           05/24/2022

## 2022-05-24 NOTE — Patient Instructions (Signed)
Start prednisone '30mg'$  daily tonight.  Increase Tresiba to 24 units daily starting tomorrow.

## 2022-05-25 LAB — BASIC METABOLIC PANEL
BUN/Creatinine Ratio: 20 (calc) (ref 6–22)
BUN: 13 mg/dL (ref 7–25)
CO2: 22 mmol/L (ref 20–32)
Calcium: 9.2 mg/dL (ref 8.6–10.3)
Chloride: 106 mmol/L (ref 98–110)
Creat: 0.65 mg/dL — ABNORMAL LOW (ref 0.70–1.35)
Glucose, Bld: 104 mg/dL — ABNORMAL HIGH (ref 65–99)
Potassium: 4 mmol/L (ref 3.5–5.3)
Sodium: 140 mmol/L (ref 135–146)

## 2022-05-25 LAB — CBC WITH DIFFERENTIAL/PLATELET
Absolute Monocytes: 808 cells/uL (ref 200–950)
Basophils Absolute: 48 cells/uL (ref 0–200)
Basophils Relative: 0.5 %
Eosinophils Absolute: 133 cells/uL (ref 15–500)
Eosinophils Relative: 1.4 %
HCT: 34.8 % — ABNORMAL LOW (ref 38.5–50.0)
Hemoglobin: 11.3 g/dL — ABNORMAL LOW (ref 13.2–17.1)
Lymphs Abs: 1511 cells/uL (ref 850–3900)
MCH: 30.4 pg (ref 27.0–33.0)
MCHC: 32.5 g/dL (ref 32.0–36.0)
MCV: 93.5 fL (ref 80.0–100.0)
MPV: 9.1 fL (ref 7.5–12.5)
Monocytes Relative: 8.5 %
Neutro Abs: 7002 cells/uL (ref 1500–7800)
Neutrophils Relative %: 73.7 %
Platelets: 445 10*3/uL — ABNORMAL HIGH (ref 140–400)
RBC: 3.72 10*6/uL — ABNORMAL LOW (ref 4.20–5.80)
RDW: 13.3 % (ref 11.0–15.0)
Total Lymphocyte: 15.9 %
WBC: 9.5 10*3/uL (ref 3.8–10.8)

## 2022-05-25 LAB — SEDIMENTATION RATE: Sed Rate: 86 mm/h — ABNORMAL HIGH (ref 0–20)

## 2022-05-25 LAB — C-REACTIVE PROTEIN: CRP: 56.3 mg/L — ABNORMAL HIGH (ref <8.0)

## 2022-05-26 ENCOUNTER — Other Ambulatory Visit: Payer: Self-pay | Admitting: Family Medicine

## 2022-05-27 ENCOUNTER — Encounter: Payer: Self-pay | Admitting: Family Medicine

## 2022-05-27 ENCOUNTER — Other Ambulatory Visit: Payer: Self-pay | Admitting: Family Medicine

## 2022-05-27 DIAGNOSIS — E1129 Type 2 diabetes mellitus with other diabetic kidney complication: Secondary | ICD-10-CM

## 2022-05-27 MED ORDER — METHYLPHENIDATE HCL ER (CD) 20 MG PO CPCR: 20.0000 mg | ORAL_CAPSULE | Freq: Every day | ORAL | 0 refills | Status: DC | PRN

## 2022-05-27 MED ORDER — FREESTYLE LIBRE 3 SENSOR MISC: 2 refills | Status: DC

## 2022-05-27 MED ORDER — FREESTYLE LIBRE 3 READER DEVI: 1.0000 | 2 refills | Status: AC

## 2022-05-27 NOTE — Telephone Encounter (Signed)
Last refill for methylphenidate, 03/04/22. If approved for both, please sign orders.

## 2022-05-27 NOTE — Telephone Encounter (Signed)
Pt is inquiring about the freestyle libre monitor. He would like to have that sent to his pharmacy to see if it would be approved or not. He is also requesting a refill on Methylphenidate which I do not see on his current medication list but is listed in the history. Please advise patient. Pharmacy is [facility] [place].

## 2022-05-28 NOTE — Telephone Encounter (Signed)
Noted  

## 2022-05-28 NOTE — Telephone Encounter (Signed)
FYI  Please see below

## 2022-05-29 ENCOUNTER — Telehealth: Payer: Self-pay

## 2022-05-29 DIAGNOSIS — K50119 Crohn's disease of large intestine with unspecified complications: Secondary | ICD-10-CM

## 2022-05-29 NOTE — Telephone Encounter (Signed)
-----   Message from [place], DO sent at 05/27/2022  4:59 PM EDT ----- Regarding: FW: mutual pt Difficult to know if this patient is acutely flaring and refractory to current medication, steroid dependent, or a combination, but we need to do the following:  - Continue prednisone prescribed by his PCM - Try submitting GI PCR panel and fecal calprotectin as previously ordered - Restart short-chain fatty acid enemas.  This needs to be ordered through a compounding pharmacy - Check infliximab trough and antibody level 1 week prior to the next infusion - Please get him a expedited follow-up appoint with me or one of the APP's - I will again discuss his case with some of my GI colleagues here, but we may need to consider referral to one of the big IBD centers  Thank you.  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 I restarted him on prednisone last week.   You are set to see him late in April but could you possibly move this up some? I know you probably get asked this a lot... Best, Abbe [place]

## 2022-05-29 NOTE — Telephone Encounter (Signed)
Called and spoke with patient regarding recommendations outlined below. Pt states that his PCP started him on Prednisone 30 mg on Friday. Patient states that he is not producing enough stool rectally anymore, he passes stool in his ostomy all of the time. Pt states that he still has rectal discharge, he is color blind so he is not sure what the color is, but it has a watery consistency. Pt does not think that it is blood or mucous. Pt states that he remembers using short-chain fatty acid enemas, but if not covered by his insurance he does not think it will be affordable. Pt understands that you are discussing his case further with your colleagues. Pt has been advised that he will need labs on 3/26 since his infusion is on 4/2. Will call to remind patient. Pt's f/u has been moved to 06/24/22 at 2:40 pm with you, patient is OK with this appt. Your other triage spots are already full. Pt wants to know how to proceed with stool samples and enemas  Infliximab lab reminder in epic - due on 3/26 - next Remicade infusion is 4/2

## 2022-05-30 NOTE — Addendum Note (Signed)
Addended by: Yevette Edwards on: 05/30/2022 09:08 AM   Modules accepted: Orders

## 2022-05-30 NOTE — Telephone Encounter (Signed)
Called and spoke with patient regarding Dr. Vivia Ewing recommendations. Pt is aware that we will hold off on short-chain fatty acid enemas. Pt will use sample from colostomy bag to submit stool tests. Pt states that he follows up with his PCP tomorrow and he will check to see if Dr. Anitra Lauth can draw his Remicade levels at his office since it's more convenient to get there then coming all of the way to our [place] office. I told pt that it would be fine to have labs drawn there if OK with Dr. Idelle Leech office. Pt will discuss if Prednisone refill is needed at his f/u tomorrow. Pt verbalized understanding of all information and had no concerns at the end of the call.

## 2022-05-31 ENCOUNTER — Encounter: Payer: Self-pay | Admitting: Gastroenterology

## 2022-05-31 ENCOUNTER — Ambulatory Visit (HOSPITAL_COMMUNITY)
Admission: RE | Admit: 2022-05-31 | Discharge: 2022-05-31 | Disposition: A | Discharge status: Home / Self Care | Payer: Managed Care, Other (non HMO) | Source: Ambulatory Visit | Attending: Nurse Practitioner | Admitting: Nurse Practitioner

## 2022-05-31 ENCOUNTER — Encounter: Payer: Self-pay | Admitting: Family Medicine

## 2022-05-31 ENCOUNTER — Other Ambulatory Visit: Payer: 59

## 2022-05-31 ENCOUNTER — Ambulatory Visit (INDEPENDENT_AMBULATORY_CARE_PROVIDER_SITE_OTHER): Payer: 59 | Admitting: Family Medicine

## 2022-05-31 VITALS — BP 108/66 | HR 86 | Temp 99.0°F | Ht 70.0 in | Wt 215.8 lb

## 2022-05-31 DIAGNOSIS — K435 Parastomal hernia without obstruction or  gangrene: Secondary | ICD-10-CM | POA: Diagnosis present

## 2022-05-31 DIAGNOSIS — E1129 Type 2 diabetes mellitus with other diabetic kidney complication: Secondary | ICD-10-CM | POA: Diagnosis not present

## 2022-05-31 DIAGNOSIS — K50918 Crohn's disease, unspecified, with other complication: Secondary | ICD-10-CM | POA: Diagnosis not present

## 2022-05-31 DIAGNOSIS — Z794 Long term (current) use of insulin: Secondary | ICD-10-CM

## 2022-05-31 DIAGNOSIS — R809 Proteinuria, unspecified: Secondary | ICD-10-CM | POA: Diagnosis not present

## 2022-05-31 DIAGNOSIS — Z933 Colostomy status: Secondary | ICD-10-CM | POA: Diagnosis not present

## 2022-05-31 DIAGNOSIS — K4020 Bilateral inguinal hernia, without obstruction or gangrene, not specified as recurrent: Secondary | ICD-10-CM

## 2022-05-31 DIAGNOSIS — K9400 Colostomy complication, unspecified: Secondary | ICD-10-CM

## 2022-05-31 DIAGNOSIS — K50119 Crohn's disease of large intestine with unspecified complications: Secondary | ICD-10-CM

## 2022-05-31 MED ORDER — PREDNISONE 5 MG PO TABS: ORAL_TABLET | ORAL | 0 refills | Status: DC

## 2022-05-31 NOTE — Progress Notes (Signed)
[facility]   Reason for visit:  LMQ colostomy with large parastomal hernia.  Is having issues with pouch "flopping around"  due to stoma location in rounded herniated portion of abdomen.   HPI:  Bowel perforation with colostomy Past Medical History:  Diagnosis Date   Adult ADHD    Allergic rhinitis    Anxiety    claustrophobic   Bronchopneumonia 11/03/2013   Colon polyps 2012 and 2013   All hyperplastic except one adenomatous w/out high grade dysplasia (recall 2023 per Dr. Deatra Ina)   COVID-19 virus infection 09/29/2020   Elevated transaminase level 2017   Viral hep screening neg.  Suspect fatty liver.   History of pneumonia    Hyperlipidemia    Hypertension    Inflammatory bowel disease    01/2022   Large bowel perforation ([place]) 02/2022   delayed, s/p colonoscopy w/biopsies (new dx crohn's/inflammatory BD)   Obesity    OSA on CPAP    Severe.  CPAP 10 cm H2O.  Follows Dr. Ander Slade.   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 [name] did surg   Right leg DVT ([place])    04/12/22 R fem and pop   Seborrheic dermatitis of scalp    and face: hydrocort 2% cream bid prn to face, and fluocinonide 0.5% sol'n to scalp bid prn per Dr. Allyson Sabal (04/2014)   Secondary male hypogonadism 04/2021   Type 2 diabetes mellitus with microalbuminuria ([place]) DM dx-09/27/2015. Microalb 2020   Family History  Problem Relation Age of Onset   Lung cancer Father    Cancer Father        nasal   Allergies Father    Colon cancer Neg Hx    Esophageal cancer Neg Hx    Rectal cancer Neg Hx    Stomach cancer Neg Hx    Allergies  Allergen Reactions   Lobster [Shellfish Allergy] Nausea Only   Poison Ivy Extract Itching   Current Outpatient Medications  Medication Sig Dispense Refill Last Dose   Accu-Chek Softclix Lancets lancets SMARTSIG:Topical 200 each 5    acetaminophen (TYLENOL) 325 MG tablet Take 2 tablets (650 mg total) by mouth every 4 (four) hours as needed for mild pain,  moderate pain, fever or headache.      alum & mag hydroxide-simeth (MAALOX/MYLANTA) 200-200-20 MG/5ML suspension Take 30 mLs by mouth every 6 (six) hours as needed for indigestion or heartburn (or bloating). (Patient not taking: Reported on 04/26/2022) 355 mL 0    apixaban (ELIQUIS) 5 MG TABS tablet Take 1 tablet (5 mg total) by mouth 2 (two) times daily. 120 tablet 0    ascorbic acid (VITAMIN C) 500 MG tablet Take 1 tablet (500 mg total) by mouth 2 (two) times daily.      atorvastatin (LIPITOR) 40 MG tablet TAKE 1 TABLET BY MOUTH EVERY DAY 90 tablet 1    Continuous Blood Gluc Receiver (FREESTYLE LIBRE 3 READER) DEVI 1 each by Does not apply route every 14 (fourteen) days. 2 each 2    Continuous Blood Gluc Sensor (FREESTYLE LIBRE 3 SENSOR) MISC Place 1 sensor on the skin every 14 days. Use to check glucose continuously 2 each 2    diphenhydrAMINE (BENADRYL) 25 mg capsule Take 25 mg by mouth daily as needed for allergies. (Patient not taking: Reported on 04/26/2022)      docusate sodium (COLACE) 100 MG capsule Take 100 mg by mouth daily. (Patient not taking: Reported on 04/26/2022)      empagliflozin (JARDIANCE) 10  MG TABS tablet Take 1 tablet (10 mg total) by mouth daily before breakfast. 30 tablet 0    furosemide (LASIX) 20 MG tablet Take 1 tablet (20 mg total) by mouth every other day. 60 tablet 3    glucose blood (ACCU-CHEK GUIDE) test strip 1 each by Other route 4 (four) times daily -  before meals and at bedtime. 300 strip 5    insulin degludec (TRESIBA FLEXTOUCH) 100 UNIT/ML FlexTouch Pen Inject 24 Units into the skin at bedtime. 15 mL 0    Insulin Pen Needle (AURORA PEN NEEDLES) 31G X 6 MM MISC 1 Application by Does not apply route at bedtime. 100 each 5    magnesium oxide (MAG-OX) 400 (240 Mg) MG tablet Take 2 tablets (800 mg total) by mouth 3 (three) times daily. 180 tablet 1    metFORMIN (GLUCOPHAGE) 1000 MG tablet Take 1 tablet (1,000 mg total) by mouth 2 (two) times daily with a meal. 180 tablet  3    methylphenidate (METADATE CD) 20 MG CR capsule Take 1 capsule (20 mg total) by mouth daily as needed. 30 capsule 0    Multiple Vitamin (MULTIVITAMIN WITH MINERALS) TABS tablet Take 1 tablet by mouth daily.      pantoprazole (PROTONIX) 40 MG tablet Take 1 tablet (40 mg total) by mouth daily. 90 tablet 1    predniSONE (DELTASONE) 10 MG tablet 3 tabs po qd x7d 21 tablet 0    prochlorperazine (COMPAZINE) 5 MG tablet Take 1-2 tablets (5-10 mg total) by mouth every 6 (six) hours as needed for nausea. 30 tablet 0    simethicone (MYLICON) 40 99991111 drops Take 1.2 mLs (80 mg total) by mouth 4 (four) times daily as needed for flatulence (PRN bloating, gassiness). 30 mL 0    thiamine (VITAMIN B-1) 100 MG tablet Take 1 tablet (100 mg total) by mouth daily.      zinc sulfate 220 (50 Zn) MG capsule Take 1 capsule (220 mg total) by mouth daily. 30 capsule 0    No current facility-administered medications for this encounter.   ROS  Review of Systems  Gastrointestinal:        LMQ colostomy Hernia to abdomen  Skin:  Positive for color change.  All other systems reviewed and are negative.  Vital signs:  BP 128/80   Pulse 95   Temp 98.8 F (37.1 C) (Oral)   Resp 18   SpO2 95%  Exam:  Physical Exam Vitals reviewed.  Constitutional:      Appearance: He is obese.  Abdominal:     Palpations: Abdomen is soft.     Hernia: A hernia is present.  Skin:    General: Skin is warm and dry.     Findings: Erythema present.  Neurological:     Mental Status: He is alert and oriented to person, place, and time.  Psychiatric:        Mood and Affect: Mood normal.        Behavior: Behavior normal.     Stoma type/location:  LMQ in large parastomal hernia Stomal assessment/size:  1 3/8" slightly budded Peristomal assessment:  intact Treatment options for stomal/peristomal skin: barrier ring 2 piece pouch Output: soft brown stool Ostomy pouching:pouch barrier ring stoma powder and skin pre Education  provided:  wants a system to securely hold pouch in place.  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 We implement coloplast hernia support belt.  Challenges to care include emptying. He  is unable to do this independently.  States he is a big guy and can't empty into toilet sitting.  We discuss strategies to try to empty independently    Impression/dx  Coloplast Abdominal hernia Discussion  Fitted with belt Plan  Follow up as needed.     Visit time: 55 minutes.   Domenic Moras FNP-BC

## 2022-05-31 NOTE — Progress Notes (Signed)
OFFICE VISIT  05/31/2022  CC:  Chief Complaint  Patient presents with   Medical Management of Chronic Issues    Patient is a 67 y.o. male who presents for 1 week follow-up Crohn's disease. A/P as of last visit: "#1 Crohn's disease. I feel like he is in an acute flare/uncontrolled inflammation since weaning off of prednisone. CBC, BMET, ESR, CRP. Will restart prednisone at 30 mg a day and in 1 week decrease to 20 mg a day. His gastroenterology follow-up is not until April 24.  We will try to move this up some. He has a visit with the ostomy clinic in 1 week. He is considering nutritionist referral as well.   #2 diabetes, poor control on prednisone. Hemoglobin A1c today 7%.  Will increase Tresiba to 24 units a day and gradually titrate up to accommodate for hyperglycemia that he will likely see reemerged now that he is getting back on prednisone.  He will stay on Jardiance 10 mg a day as well.  Okay to continue metformin 1000 mg twice daily.   #3 Right leg DVT, provoked. He will need Eliquis x 3 months (estimated stop date 07/17/22). Expect gradual resolution of right leg swelling, with the possibility of some chronic swelling."  INTERIM HX: Feeling much better.  Colostomy output looks much better.  His appetite is back up.  No more headaches.  He is no longer taking naps in the day.  He got on his lawnmower and mowed the lawn yesterday. Glucoses anywhere from 90s to 140s. No increase in legs swelling.  His GI follow-up appointment was moved up to 4/8.  ROS as above, plus--> no fevers, no CP, no SOB, no wheezing, no cough, no dizziness,no rashes, no melena/hematochezia.  No polyuria or polydipsia.  No myalgias or arthralgias.  No focal weakness, paresthesias, or tremors.   No dysuria or unusual/new urinary urgency or frequency.  No palpitations.    Past Medical History:  Diagnosis Date   Adult ADHD    Allergic rhinitis    Anxiety    claustrophobic   Bronchopneumonia 11/03/2013    Colon polyps 2012 and 2013   All hyperplastic except one adenomatous w/out high grade dysplasia (recall 2023 per Dr. Deatra Ina)   COVID-19 virus infection 09/29/2020   Elevated transaminase level 2017   Viral hep screening neg.  Suspect fatty liver.   History of pneumonia    Hyperlipidemia    Hypertension    Inflammatory bowel disease    01/2022   Large bowel perforation ([place]) 02/2022   delayed, s/p colonoscopy w/biopsies (new dx crohn's/inflammatory BD)   Obesity    OSA on CPAP    Severe.  CPAP 10 cm H2O.  Follows Dr. Ander Slade.   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 [name] did surg   Right leg DVT ([place])    04/12/22 R fem and pop   Seborrheic dermatitis of scalp    and face: hydrocort 2% cream bid prn to face, and fluocinonide 0.5% sol'n to scalp bid prn per Dr. Allyson Sabal (04/2014)   Secondary male hypogonadism 04/2021   Type 2 diabetes mellitus with microalbuminuria ([place]) DM dx-09/27/2015. Microalb 2020    Past Surgical History:  Procedure Laterality Date   COLONOSCOPY W/ POLYPECTOMY  04/2010; 10/2011   Initial screening TCS showed 6 polyps (one adenomatous).  Pt says he returned for repeat TCS 10/2011 showed one hyperplastic polyp: Recall 10 yrs per Dr. Samuella Cota SIGMOIDOSCOPY N/A 02/25/2022   Procedure: FLEXIBLE SIGMOIDOSCOPY;  Surgeon:  Mansouraty, Telford Nab., MD;  Location: [facility];  Service: Gastroenterology;  Laterality: N/A;   FLEXIBLE SIGMOIDOSCOPY N/A 03/04/2022   Procedure: FLEXIBLE SIGMOIDOSCOPY;  Surgeon: Lavena Bullion, DO;  Location: [place];  Service: Gastroenterology;  Laterality: N/A;   HEMOSTASIS CONTROL  02/25/2022   Procedure: HEMOSTASIS CONTROL;  Surgeon: Irving Copas., MD;  Location: [facility];  Service: Gastroenterology;;   HEMOSTASIS CONTROL  03/04/2022   Procedure: HEMOSTASIS CONTROL;  Surgeon: Lavena Bullion, DO;  Location: [place];  Service: Gastroenterology;;   INCISION AND DRAINAGE ABSCESS N/A  11/07/2021   Procedure: Incision and drainage of scrotal abscess;  Surgeon: Leighton Ruff, MD;  Location: [facility];  Service: General;  Laterality: N/A;   IR SINUS/FIST TUBE CHK-NON GI  03/06/2022   LAPAROTOMY N/A 02/07/2022   Procedure: EXPLORATORY LAPAROTOMY, COLON RESECTION, OSTOMY;  Surgeon: Leighton Ruff, MD;  Location: [facility];  Service: General;  Laterality: N/A;   MOUTH SURGERY     TRANSTHORACIC ECHOCARDIOGRAM     03/05/22 NORMAL    Outpatient Medications Prior to Visit  Medication Sig Dispense Refill   Accu-Chek Softclix Lancets lancets SMARTSIG:Topical 200 each 5   acetaminophen (TYLENOL) 325 MG tablet Take 2 tablets (650 mg total) by mouth every 4 (four) hours as needed for mild pain, moderate pain, fever or headache.     apixaban (ELIQUIS) 5 MG TABS tablet Take 1 tablet (5 mg total) by mouth 2 (two) times daily. 120 tablet 0   ascorbic acid (VITAMIN C) 500 MG tablet Take 1 tablet (500 mg total) by mouth 2 (two) times daily.     atorvastatin (LIPITOR) 40 MG tablet TAKE 1 TABLET BY MOUTH EVERY DAY 90 tablet 1   Continuous Blood Gluc Receiver (FREESTYLE LIBRE 3 READER) DEVI 1 each by Does not apply route every 14 (fourteen) days. 2 each 2   Continuous Blood Gluc Sensor (FREESTYLE LIBRE 3 SENSOR) MISC Place 1 sensor on the skin every 14 days. Use to check glucose continuously 2 each 2   empagliflozin (JARDIANCE) 10 MG TABS tablet Take 1 tablet (10 mg total) by mouth daily before breakfast. 30 tablet 0   furosemide (LASIX) 20 MG tablet Take 1 tablet (20 mg total) by mouth every other day. 60 tablet 3   glucose blood (ACCU-CHEK GUIDE) test strip 1 each by Other route 4 (four) times daily -  before meals and at bedtime. 300 strip 5   insulin degludec (TRESIBA FLEXTOUCH) 100 UNIT/ML FlexTouch Pen Inject 24 Units into the skin at bedtime. 15 mL 0   Insulin Pen Needle (AURORA PEN NEEDLES) 31G X 6 MM MISC 1 Application by Does not apply route at bedtime. 100 each 5   magnesium oxide (MAG-OX)  400 (240 Mg) MG tablet Take 2 tablets (800 mg total) by mouth 3 (three) times daily. 180 tablet 1   metFORMIN (GLUCOPHAGE) 1000 MG tablet Take 1 tablet (1,000 mg total) by mouth 2 (two) times daily with a meal. 180 tablet 3   methylphenidate (METADATE CD) 20 MG CR capsule Take 1 capsule (20 mg total) by mouth daily as needed. 30 capsule 0   Multiple Vitamin (MULTIVITAMIN WITH MINERALS) TABS tablet Take 1 tablet by mouth daily.     pantoprazole (PROTONIX) 40 MG tablet Take 1 tablet (40 mg total) by mouth daily. 90 tablet 1   prochlorperazine (COMPAZINE) 5 MG tablet Take 1-2 tablets (5-10 mg total) by mouth every 6 (six) hours as needed for nausea. 30 tablet 0   simethicone (  MYLICON) 40 [id number] drops Take 1.2 mLs (80 mg total) by mouth 4 (four) times daily as needed for flatulence (PRN bloating, gassiness). 30 mL 0   thiamine (VITAMIN B-1) 100 MG tablet Take 1 tablet (100 mg total) by mouth daily.     zinc sulfate 220 (50 Zn) MG capsule Take 1 capsule (220 mg total) by mouth daily. 30 capsule 0   predniSONE (DELTASONE) 10 MG tablet 3 tabs po qd x7d 21 tablet 0   alum & mag hydroxide-simeth (MAALOX/MYLANTA) 200-200-20 MG/5ML suspension Take 30 mLs by mouth every 6 (six) hours as needed for indigestion or heartburn (or bloating). (Patient not taking: Reported on 04/26/2022) 355 mL 0   diphenhydrAMINE (BENADRYL) 25 mg capsule Take 25 mg by mouth daily as needed for allergies. (Patient not taking: Reported on 04/26/2022)     docusate sodium (COLACE) 100 MG capsule Take 100 mg by mouth daily. (Patient not taking: Reported on 04/26/2022)     No facility-administered medications prior to visit.    Allergies  Allergen Reactions   Lobster [Shellfish Allergy] Nausea Only   Poison Ivy Extract Itching    Review of Systems As per HPI  PE:    05/31/2022    3:56 PM 05/31/2022   10:19 AM 05/24/2022    3:22 PM  Vitals with BMI  Height 5\' 10"   5\' 10"   Weight 215 lbs 13 oz  220 lbs 6 oz  BMI [id number]  [id number]   Systolic [id number]  Diastolic 66 80 70  Pulse 86 95 106     Physical Exam  Gen: Alert, well appearing.  Patient is oriented to person, place, time, and situation. AFFECT: pleasant, lucid thought and speech. ABD: soft, nontender  LABS:  Last CBC Lab Results  Component Value Date   WBC 9.5 05/24/2022   HGB 11.3 (L) 05/24/2022   HCT 34.8 (L) 05/24/2022   MCV 93.5 05/24/2022   MCH 30.4 05/24/2022   RDW 13.3 05/24/2022   PLT 445 (H) [id number]   Last metabolic panel Lab Results  Component Value Date   GLUCOSE 104 (H) 05/24/2022   NA 140 05/24/2022   K 4.0 05/24/2022   CL 106 05/24/2022   CO2 22 05/24/2022   BUN 13 05/24/2022   CREATININE 0.65 (L) 05/24/2022   GFRNONAA >60 04/12/2022   CALCIUM 9.2 05/24/2022   PHOS 2.3 (L) 03/01/2022   PROT 6.7 04/04/2022   ALBUMIN 3.4 (L) 04/04/2022   BILITOT 0.5 04/04/2022   ALKPHOS 82 04/04/2022   AST 13 04/04/2022   ALT 12 04/04/2022   ANIONGAP 7 04/12/2022   Last hemoglobin A1c Lab Results  Component Value Date   HGBA1C 7.0 (A) 05/24/2022   HGBA1C 7.0 05/24/2022   HGBA1C 7.0 (A) 05/24/2022   HGBA1C 7.0 05/24/2022   Lab Results  Component Value Date   ESRSEDRATE 86 (H) 05/24/2022   IMPRESSION AND PLAN:  #1 Crohn's disease, improving with restart of prednisone 1 week ago. Decreased to 25 mg prednisone daily and decrease by 1 tab every 7 days. He will be getting his next Remicade infusion early next month.  He will come here for lab visit on 3/26--- Remicade labs, CBC, c-Met, sed rate. He has GI follow-up set for 06/24/2022.  #2 type 2 diabetes with history of microalbuminuria. Control not ideal but doing pretty well considering restart of prednisone recently. Continue 24 units of Tresiba every morning, Jardiance 10 mg a day, and metformin 1000 mg twice a day. Next A1c due  around 08/24/22.  An After Visit Summary was printed and given to the patient.  FOLLOW UP: Return in about 6 weeks (around 07/12/2022) for routine  chronic illness f/u.  Signed:  Crissie Sickles, MD           05/31/2022

## 2022-06-01 DIAGNOSIS — K9400 Colostomy complication, unspecified: Secondary | ICD-10-CM | POA: Insufficient documentation

## 2022-06-01 DIAGNOSIS — K4020 Bilateral inguinal hernia, without obstruction or gangrene, not specified as recurrent: Secondary | ICD-10-CM | POA: Insufficient documentation

## 2022-06-01 NOTE — Discharge Instructions (Signed)
Added hernia support belt (4XL ) coloplast Information provided for Stealth belt

## 2022-06-03 LAB — GI PROFILE, STOOL, PCR

## 2022-06-04 LAB — CALPROTECTIN, FECAL: Calprotectin, Fecal: 15 ug/g (ref 0–120)

## 2022-06-07 LAB — HM DIABETES EYE EXAM

## 2022-06-10 ENCOUNTER — Telehealth: Payer: Self-pay | Admitting: Pulmonary Disease

## 2022-06-10 ENCOUNTER — Other Ambulatory Visit: Payer: Self-pay

## 2022-06-10 NOTE — Telephone Encounter (Signed)
Pls fax last CPAP Script to the following:  [employer]: Fax [phone]  He has been using his travel CPAP and his main Cpap died, he said. (Error:Motor has exceeded)  Pls call w/quest. : [phone]

## 2022-06-10 NOTE — Telephone Encounter (Signed)
He would also like a copy mailed to him as well (of the RX)

## 2022-06-11 ENCOUNTER — Encounter: Payer: Self-pay | Admitting: Gastroenterology

## 2022-06-11 ENCOUNTER — Other Ambulatory Visit (INDEPENDENT_AMBULATORY_CARE_PROVIDER_SITE_OTHER): Payer: Managed Care, Other (non HMO)

## 2022-06-11 DIAGNOSIS — K50918 Crohn's disease, unspecified, with other complication: Secondary | ICD-10-CM

## 2022-06-11 LAB — CBC WITH DIFFERENTIAL/PLATELET
Basophils Absolute: 0 10*3/uL (ref 0.0–0.1)
Basophils Relative: 0.4 % (ref 0.0–3.0)
Eosinophils Absolute: 0 10*3/uL (ref 0.0–0.7)
Eosinophils Relative: 0 % (ref 0.0–5.0)
HCT: 40 % (ref 39.0–52.0)
Hemoglobin: 13.2 g/dL (ref 13.0–17.0)
Lymphocytes Relative: 5.3 % — ABNORMAL LOW (ref 12.0–46.0)
Lymphs Abs: 0.6 10*3/uL — ABNORMAL LOW (ref 0.7–4.0)
MCHC: 33.1 g/dL (ref 30.0–36.0)
MCV: 91.5 fl (ref 78.0–100.0)
Monocytes Absolute: 0.4 10*3/uL (ref 0.1–1.0)
Monocytes Relative: 3.1 % (ref 3.0–12.0)
Neutro Abs: 11 10*3/uL — ABNORMAL HIGH (ref 1.4–7.7)
Neutrophils Relative %: 91.2 % — ABNORMAL HIGH (ref 43.0–77.0)
Platelets: 363 10*3/uL (ref 150.0–400.0)
RBC: 4.37 Mil/uL (ref 4.22–5.81)
RDW: 14.9 % (ref 11.5–15.5)
WBC: 12.1 10*3/uL — ABNORMAL HIGH (ref 4.0–10.5)

## 2022-06-11 LAB — COMPREHENSIVE METABOLIC PANEL
ALT: 17 U/L (ref 0–53)
AST: 18 U/L (ref 0–37)
Albumin: 3.8 g/dL (ref 3.5–5.2)
Alkaline Phosphatase: 122 U/L — ABNORMAL HIGH (ref 39–117)
BUN: 14 mg/dL (ref 6–23)
CO2: 25 mEq/L (ref 19–32)
Calcium: 9.7 mg/dL (ref 8.4–10.5)
Chloride: 102 mEq/L (ref 96–112)
Creatinine, Ser: 0.52 mg/dL (ref 0.40–1.50)
GFR: 105.27 mL/min (ref >60.00)
Glucose, Bld: 185 mg/dL — ABNORMAL HIGH (ref 70–99)
Potassium: 4.4 mEq/L (ref 3.5–5.1)
Sodium: 138 mEq/L (ref 135–145)
Total Bilirubin: 0.4 mg/dL (ref 0.2–1.2)
Total Protein: 7.4 g/dL (ref 6.0–8.3)

## 2022-06-11 LAB — SEDIMENTATION RATE: Sed Rate: 80 mm/hr — ABNORMAL HIGH (ref 0–20)

## 2022-06-12 NOTE — Telephone Encounter (Signed)
Patient was last seen by Dr. Ander Slade in 2023 and was advised to follow up as needed.   Dr. Ander Slade, are you ok with [place] placing an order for him to get a new cpap machine?

## 2022-06-12 NOTE — Telephone Encounter (Signed)
Last order on file was from 03/27/21.  No current order has been placed.

## 2022-06-13 ENCOUNTER — Other Ambulatory Visit: Payer: Self-pay

## 2022-06-13 DIAGNOSIS — G4733 Obstructive sleep apnea (adult) (pediatric): Secondary | ICD-10-CM

## 2022-06-13 NOTE — Telephone Encounter (Signed)
Spoke with patient. Advised order has been placed for patient to receive a new cpap machine. Patient also requested a copy of the order sent to him. I advised I would mail that out. He verbalized understanding. NFN

## 2022-06-13 NOTE — Telephone Encounter (Signed)
Can place an order for auto CPAP 5-20 with heated humidification

## 2022-06-17 LAB — SERIAL MONITORING

## 2022-06-17 LAB — INFLIXIMAB+AB (SERIAL MONITOR)
Anti-Infliximab Antibody: 67 ng/mL
Infliximab Drug Level: <0.4 ug/mL

## 2022-06-18 ENCOUNTER — Ambulatory Visit: Payer: Managed Care, Other (non HMO) | Admitting: *Deleted

## 2022-06-18 VITALS — BP 115/77 | HR 88 | Temp 98.3°F | Resp 16 | Ht 70.0 in | Wt 212.6 lb

## 2022-06-18 DIAGNOSIS — K50118 Crohn's disease of large intestine with other complication: Secondary | ICD-10-CM | POA: Diagnosis not present

## 2022-06-18 DIAGNOSIS — K50119 Crohn's disease of large intestine with unspecified complications: Secondary | ICD-10-CM | POA: Diagnosis not present

## 2022-06-18 MED ORDER — METHYLPREDNISOLONE SODIUM SUCC 40 MG IJ SOLR
40.0000 mg | Freq: Once | INTRAMUSCULAR | Status: AC
  Administered 2022-06-18: 40 mg via INTRAVENOUS
  Filled 2022-06-18: qty 1

## 2022-06-18 MED ORDER — ACETAMINOPHEN 325 MG PO TABS
650.0000 mg | ORAL_TABLET | Freq: Once | ORAL | Status: AC
  Administered 2022-06-18: 650 mg via ORAL
  Filled 2022-06-18: qty 2

## 2022-06-18 MED ORDER — SODIUM CHLORIDE 0.9 % IV SOLN
5.0000 mg/kg | Freq: Once | INTRAVENOUS | Status: AC
  Administered 2022-06-18: 500 mg via INTRAVENOUS
  Filled 2022-06-18: qty 50

## 2022-06-18 MED ORDER — DIPHENHYDRAMINE HCL 25 MG PO CAPS
25.0000 mg | ORAL_CAPSULE | Freq: Once | ORAL | Status: AC
  Administered 2022-06-18: 25 mg via ORAL
  Filled 2022-06-18: qty 1

## 2022-06-18 NOTE — Progress Notes (Signed)
Diagnosis: Crohn's Disease  Provider:  Marshell Garfinkel MD  Procedure: Infusion  IV Type: Peripheral, IV Location: L Hand  Remicade (Infliximab), Dose: 500 mg  Infusion Start Time: [id number] AM  Infusion Stop Time: 1310 pm  Post Infusion IV Care: Observation period completed and Peripheral IV Discontinued  Discharge: Condition: Good, Destination: Home . AVS Provided and AVS Declined  Performed by:  Oren Beckmann, RN

## 2022-06-19 NOTE — Telephone Encounter (Signed)
d 

## 2022-06-22 ENCOUNTER — Other Ambulatory Visit: Payer: Self-pay | Admitting: Family Medicine

## 2022-06-24 ENCOUNTER — Encounter: Payer: Self-pay | Admitting: Gastroenterology

## 2022-06-24 ENCOUNTER — Telehealth: Payer: Self-pay

## 2022-06-24 ENCOUNTER — Ambulatory Visit (INDEPENDENT_AMBULATORY_CARE_PROVIDER_SITE_OTHER): Payer: Managed Care, Other (non HMO) | Admitting: Gastroenterology

## 2022-06-24 VITALS — BP 126/60 | HR 102 | Ht 70.0 in | Wt 211.0 lb

## 2022-06-24 DIAGNOSIS — K50119 Crohn's disease of large intestine with unspecified complications: Secondary | ICD-10-CM | POA: Diagnosis not present

## 2022-06-24 DIAGNOSIS — D509 Iron deficiency anemia, unspecified: Secondary | ICD-10-CM | POA: Diagnosis not present

## 2022-06-24 DIAGNOSIS — I824Y9 Acute embolism and thrombosis of unspecified deep veins of unspecified proximal lower extremity: Secondary | ICD-10-CM

## 2022-06-24 DIAGNOSIS — Z7189 Other specified counseling: Secondary | ICD-10-CM

## 2022-06-24 NOTE — Telephone Encounter (Signed)
Per office visit today increase Remicade 10 mg/kg every 8 weeks.  New orders have been placed for the [facility] infusion [facility].

## 2022-06-24 NOTE — Telephone Encounter (Signed)
Will do refill but please have him come by at his earliest convenience for a nonfasting lab visit to check a magnesium level. Also, ask how he is feeling overall.  Thanks.

## 2022-06-24 NOTE — Progress Notes (Unsigned)
Chief Complaint:    Crohn's Disease  GI History: 67 year old male with advanced phenotypic, penetrating colonic Crohn's Disease. - 02/04/2022: Colonoscopy: severe colitis with deeply cratered ulcers throughout the colon and mucosal friability and normal TI (path: Chronic active colitis).  - 02/07/2022: Admitted with sigmoid perforation, requiring emergent surgery with descending/sigmoid colectomy, transverse colostomy. - 02/25/2022: Active lower GI bleed.  Sigmoidoscopy performed emergently and notable for deep cratered rectal ulcers with bleeding stigmata, treated with PuraSTAT gel - 02/26/2022: IR drainage large LLQ abscess, grew E coli, bacteroides.  Treated with Flagyl, Rocephin.  Drain interrogated (no fistula, collapsed/resolved abscess) and removed on 12/20.   -03/04/2022: Repeat flexible sigmoidoscopy for recurrent bleed.  Cratered ulcers, but overall improved from the previous sigmoidoscopy.  Again treated any stigmata with PuraSTAT gel with resolution -03/06/2022: Started Remicade along with short-chain fatty acid enemas x 2 weeks for possible overlapping diversion colitis. -03/07/2022: Changed IV Solu-Medrol to prednisone 40 mg/day.  Plan for continued Flagyl low-dose x 4 weeks.  Surgery recommended against repeat colonoscopy via ostomy that close to surgery. - 03/20/2022: Remicade dose #2 and started prednisone taper - 03/25/2022: Discharged from inpatient rehab to home - 04/12/2022: RLE DVT.  Started on Eliquis - 05/31/2022: Fecal calprotectin 15 (from 3570 at diagnosis) - 06/11/2022: Infliximab antibody 67, drug trough undetectable  HPI:     Patient is a 67 y.o. male presenting to the Gastroenterology Clinic for follow-up.  Last seen in the office by me on 04/04/2022.  Subsequently developed RLE DVT, started on Eliquis (x 3 months) and evaluated by Vascular Surgery.  Has had follow-up appointments with Dr. Maisie Fus in Colorectal Surgery, most recently on 05/20/2022 with exam notable for  large ventral hernia.  Opinion is that ostomy reversal would be difficult due to size, large ventral hernia, ostomy, IBD.  Was seen by his PCM on 05/24/2022.  ESR and CRP elevated at 86/56, suspicious for flare after steroid taper.  CBC and BMP stable.  Restarted prednisone 30 mg/day x 1 week with taper.  Stool studies positive for norovirus, otherwise negative including C. difficile.  He has also been following in the ostomy clinic.  Last Remicade infusion was 06/18/2022 (5 mg/kg).  Continues to have loose/watery stools and bowel leakage  Review of systems:     No chest pain, no SOB, no fevers, no urinary sx   Past Medical History:  Diagnosis Date   Adult ADHD    Allergic rhinitis    Anxiety    claustrophobic   Bronchopneumonia 11/03/2013   Colon polyps 2012 and 2013   All hyperplastic except one adenomatous w/out high grade dysplasia (recall 2023 per Dr. Arlyce Dice)   COVID-19 virus infection 09/29/2020   Elevated transaminase level 2017   Viral hep screening neg.  Suspect fatty liver.   History of pneumonia    Hyperlipidemia    Hypertension    Inflammatory bowel disease    01/2022   Large bowel perforation ([facility]) 02/2022   delayed, s/p colonoscopy w/biopsies (new dx crohn's/inflammatory BD)   Obesity    OSA on CPAP    Severe.  CPAP 10 cm H2O.  Follows Dr. Wynona Neat.   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 Maisie Fus did surg   Right leg DVT ([facility])    04/12/22 R fem and pop   Seborrheic dermatitis of scalp    and face: hydrocort 2% cream bid prn to face, and fluocinonide 0.5% sol'n to scalp bid prn per Dr. Terri Piedra (04/2014)   Secondary male hypogonadism 04/2021  Type 2 diabetes mellitus with microalbuminuria ([facility]) DM dx-09/27/2015. Microalb 2020    Patient's surgical history, family medical history, social history, medications and allergies were all reviewed in Epic    Current Outpatient Medications  Medication Sig Dispense Refill   Accu-Chek Softclix Lancets lancets  SMARTSIG:Topical 200 each 5   acetaminophen (TYLENOL) 325 MG tablet Take 2 tablets (650 mg total) by mouth every 4 (four) hours as needed for mild pain, moderate pain, fever or headache.     alum & mag hydroxide-simeth (MAALOX/MYLANTA) 200-200-20 MG/5ML suspension Take 30 mLs by mouth every 6 (six) hours as needed for indigestion or heartburn (or bloating). (Patient not taking: Reported on 04/26/2022) 355 mL 0   apixaban (ELIQUIS) 5 MG TABS tablet Take 1 tablet (5 mg total) by mouth 2 (two) times daily. 120 tablet 0   ascorbic acid (VITAMIN C) 500 MG tablet Take 1 tablet (500 mg total) by mouth 2 (two) times daily.     atorvastatin (LIPITOR) 40 MG tablet TAKE 1 TABLET BY MOUTH EVERY DAY 90 tablet 1   Continuous Blood Gluc Receiver (FREESTYLE LIBRE 3 READER) DEVI 1 each by Does not apply route every 14 (fourteen) days. 2 each 2   Continuous Blood Gluc Sensor (FREESTYLE LIBRE 3 SENSOR) MISC Place 1 sensor on the skin every 14 days. Use to check glucose continuously 2 each 2   diphenhydrAMINE (BENADRYL) 25 mg capsule Take 25 mg by mouth daily as needed for allergies. (Patient not taking: Reported on 04/26/2022)     docusate sodium (COLACE) 100 MG capsule Take 100 mg by mouth daily. (Patient not taking: Reported on 04/26/2022)     empagliflozin (JARDIANCE) 10 MG TABS tablet Take 1 tablet (10 mg total) by mouth daily before breakfast. 30 tablet 0   furosemide (LASIX) 20 MG tablet Take 1 tablet (20 mg total) by mouth every other day. 60 tablet 3   glucose blood (ACCU-CHEK GUIDE) test strip 1 each by Other route 4 (four) times daily -  before meals and at bedtime. 300 strip 5   insulin degludec (TRESIBA FLEXTOUCH) 100 UNIT/ML FlexTouch Pen Inject 24 Units into the skin at bedtime. 15 mL 0   Insulin Pen Needle (AURORA PEN NEEDLES) 31G X 6 MM MISC 1 Application by Does not apply route at bedtime. 100 each 5   magnesium oxide (MAG-OX) 400 (240 Mg) MG tablet TAKE 2 TABLETS (800 MG TOTAL) BY MOUTH 3 (THREE) TIMES  DAILY. 180 tablet 1   metFORMIN (GLUCOPHAGE) 1000 MG tablet Take 1 tablet (1,000 mg total) by mouth 2 (two) times daily with a meal. 180 tablet 3   methylphenidate (METADATE CD) 20 MG CR capsule Take 1 capsule (20 mg total) by mouth daily as needed. 30 capsule 0   Multiple Vitamin (MULTIVITAMIN WITH MINERALS) TABS tablet Take 1 tablet by mouth daily.     pantoprazole (PROTONIX) 40 MG tablet Take 1 tablet (40 mg total) by mouth daily. 90 tablet 1   predniSONE (DELTASONE) 5 MG tablet 5 tabs qd x 7d, decrease by 1 tab every 7d 105 tablet 0   prochlorperazine (COMPAZINE) 5 MG tablet Take 1-2 tablets (5-10 mg total) by mouth every 6 (six) hours as needed for nausea. 30 tablet 0   simethicone (MYLICON) 40 MG/0.6ML drops Take 1.2 mLs (80 mg total) by mouth 4 (four) times daily as needed for flatulence (PRN bloating, gassiness). 30 mL 0   thiamine (VITAMIN B-1) 100 MG tablet Take 1 tablet (100 mg total) by mouth  daily.     zinc sulfate 220 (50 Zn) MG capsule Take 1 capsule (220 mg total) by mouth daily. 30 capsule 0   No current facility-administered medications for this visit.    Physical Exam:     There were no vitals taken for this visit.  GENERAL:  Pleasant *** in NAD PSYCH: : Cooperative, normal affect EENT:  conjunctiva pink, mucous membranes moist, neck supple without masses CARDIAC:  RRR, ***murmur heard, no peripheral edema PULM: Normal respiratory effort, lungs CTA bilaterally, no wheezing ABDOMEN:  Nondistended, soft, nontender. No obvious masses, no hepatomegaly,  normal bowel sounds SKIN:  turgor, no lesions seen Musculoskeletal:  Normal muscle tone, normal strength NEURO: Alert and oriented x 3, no focal neurologic deficits   IMPRESSION and PLAN:    1)   -Dose escalation of Remicade to 10 mg/kilogram vs shortening interval to every 6 weeks - MTX? - IBD discussion - RTC in 8 weeks - Nutrition clinic referral - Ostomy clinic           Shellia CleverlyVito V Malya Cirillo ,DO, FACG  06/24/2022, 2:28 PM

## 2022-06-24 NOTE — Patient Instructions (Addendum)
You have been scheduled for an appointment with Dr. Barron Alvine on 08/20/22 at 11:20 AM . Please arrive 10 minutes early for your appointment.   You will be contacted by Nutrition Clinic to schedule a consult.   _______________________________________________________  If your blood pressure at your visit was 140/90 or greater, please contact your primary care physician to follow up on this.  _______________________________________________________  If you are age 66 or older, your body mass index should be between 23-30. Your Body mass index is 30.28 kg/m. If this is out of the aforementioned range listed, please consider follow up with your Primary Care Provider.  If you are age 66 or younger, your body mass index should be between 19-25. Your Body mass index is 30.28 kg/m. If this is out of the aformentioned range listed, please consider follow up with your Primary Care Provider.   __________________________________________________________  The [facility] providers would like to encourage you to use [facility] to communicate with providers for non-urgent requests or questions.  Due to long hold times on the telephone, sending your provider a message by [facility] may be a faster and more efficient way to get a response.  Please allow 48 business hours for a response.  Please remember that this is for non-urgent requests.   Due to recent changes in healthcare laws, you may see the results of your imaging and laboratory studies on MyChart before your provider has had a chance to review them.  We understand that in some cases there may be results that are confusing or concerning to you. Not all laboratory results come back in the same time frame and the provider may be waiting for multiple results in order to interpret others.  Please give [place] 48 hours in order for your provider to thoroughly review all the results before contacting the office for clarification of your results.    Thank you for choosing  me and [facility].  Vito Cirigliano, D.O.

## 2022-06-24 NOTE — Telephone Encounter (Signed)
Please Advise if pt needs to continue

## 2022-06-25 ENCOUNTER — Other Ambulatory Visit (INDEPENDENT_AMBULATORY_CARE_PROVIDER_SITE_OTHER): Payer: Managed Care, Other (non HMO)

## 2022-06-26 ENCOUNTER — Telehealth: Payer: Self-pay | Admitting: Pulmonary Disease

## 2022-06-26 ENCOUNTER — Telehealth: Payer: Self-pay

## 2022-06-26 LAB — MAGNESIUM: Magnesium: 1.6 mg/dL (ref 1.5–2.5)

## 2022-06-26 NOTE — Telephone Encounter (Signed)
-----   Message from [place] V, DO sent at 06/26/2022  2:17 PM EDT ----- I called this patient today and need to get him started on methotrexate for his Crohn's disease (in addition to his infliximab).  I called him and he is on board to start therapy and I modified my note from earlier in the week.  Can you please coordinate the following:  - Methotrexate 15 mg subcutaneous weekly injection. - Once medication is approved, can you coordinate for him and his wife to come in for nursing instruction on methotrexate administration - Patient wants to know if we have any idea what the cost would be for this?  I told him we would know more once we put the medication through.  Thanks!

## 2022-06-27 ENCOUNTER — Encounter: Payer: Self-pay | Admitting: Family Medicine

## 2022-06-27 MED ORDER — METHOTREXATE (PF) 15 MG/0.3ML ~~LOC~~ SOAJ: 1.0000 | SUBCUTANEOUS | 3 refills | Status: DC

## 2022-06-27 MED ORDER — METHYLPHENIDATE HCL ER (CD) 20 MG PO CPCR: 20.0000 mg | ORAL_CAPSULE | Freq: Every day | ORAL | 0 refills | Status: DC | PRN

## 2022-06-27 NOTE — Telephone Encounter (Signed)
Methotrexate RX sent to pharmacy on file. Will f/u with pharmacy on pricing of medication.

## 2022-06-27 NOTE — Telephone Encounter (Signed)
Order has been faxed again to both numbers patient aware

## 2022-06-27 NOTE — Telephone Encounter (Signed)
Cirigliano, Verlin Dike, DO  Hugh Garrow, [place], RN Starting with 8 week supply, with RF3. After the 8 weeks, I may change him to PO, but will discuss that at his follow-up appt and depending on his clinical response.  Thanks!

## 2022-06-27 NOTE — Telephone Encounter (Signed)
OK, methylphenidate rx sent

## 2022-06-28 NOTE — Telephone Encounter (Signed)
[facility] [place] to f/u on Methotrexate prescription. I was informed that the RX was [facility]. [facility] [place] [facility] for over 30 minutes. We received a rejection for the claim. Donaciano Eva called the patient's insurance and was informed that it had to be filled [facility], I explained that this is where the RX was originally sent and it was automatically transferred out. [facility] and I was informed that they are receiving a rejection stating that it needs to be filled at specialty level. I was then provided telephone # to [facility] to see if the medication has to directly be filled through them. Spoke with [facility], music. She does not have the prescription on file at this time, she states that the transfer typically takes about 24-48 hours. I will call [facility] on Monday for an update.

## 2022-07-01 ENCOUNTER — Encounter: Payer: Self-pay | Admitting: Gastroenterology

## 2022-07-01 NOTE — Telephone Encounter (Addendum)
Booklyn,  Remicade 10mg /kg: 1000mg  [id number] has been approved. Patient has been scheduled.  Approved: 04/11/22 - 10/10/22 Auth: [id number] PHONE: [phone] ext: [phone] Fax: [phone]

## 2022-07-03 MED ORDER — METHOTREXATE (PF) 15 MG/0.3ML ~~LOC~~ SOAJ: 1.0000 | SUBCUTANEOUS | 3 refills | Status: DC

## 2022-07-03 NOTE — Telephone Encounter (Signed)
[facility]ed [facility] and spoke with Elle. I was informed that patient has a designated team so I was transferred to [employer] benefits and spoke with Cala Bradford. Pt called [facility] and transferred me back. I spoke with Tammie. I was informed that they can see where the transfer for the prescription was initiated but they do not have the RX on file. I told them that I will just re-send RX directly to them ([facility]) and follow up later this week.

## 2022-07-04 ENCOUNTER — Encounter: Payer: Self-pay | Admitting: Gastroenterology

## 2022-07-04 ENCOUNTER — Telehealth: Payer: Self-pay | Admitting: Family Medicine

## 2022-07-04 MED ORDER — TRESIBA FLEXTOUCH 100 UNIT/ML ~~LOC~~ SOPN: 24.0000 [IU] | PEN_INJECTOR | Freq: Every day | SUBCUTANEOUS | 1 refills | Status: DC

## 2022-07-04 NOTE — Telephone Encounter (Signed)
Richard [name] is requesting a three month supply of Tresiba due to cost, and also request that  the prescription to be sent to [facility].

## 2022-07-04 NOTE — Telephone Encounter (Signed)
Refill sent.

## 2022-07-05 NOTE — Telephone Encounter (Signed)
Patient sent a MyChart message. He should be receiving Methotrexate RX by 4/23. See 4/18 patient message for details.

## 2022-07-10 ENCOUNTER — Ambulatory Visit: Payer: 59 | Admitting: Gastroenterology

## 2022-07-15 ENCOUNTER — Telehealth: Payer: Self-pay | Admitting: Gastroenterology

## 2022-07-15 NOTE — Telephone Encounter (Signed)
PT is calling to find out when he can come In to get methotrexate injection. Also wants to know if he can go to his pcp and have them give him the medication. Please advise.

## 2022-07-16 ENCOUNTER — Ambulatory Visit: Payer: Managed Care, Other (non HMO) | Admitting: Gastroenterology

## 2022-07-16 DIAGNOSIS — K50119 Crohn's disease of large intestine with unspecified complications: Secondary | ICD-10-CM | POA: Diagnosis not present

## 2022-07-16 NOTE — Progress Notes (Unsigned)
Pt here to be shown how to do his MTX 15mg  autoinjector. Pt was given the injection in his right thigh. He feels he is capable of doing the remainder of the injections at home and will do so. He knows to call if he has any issues.

## 2022-07-17 NOTE — Telephone Encounter (Signed)
Patient came in yesterday for administration of methotrexate injection training and first injection. Patient will complete the remainder of injections at home.

## 2022-07-22 ENCOUNTER — Other Ambulatory Visit: Payer: Self-pay | Admitting: Family Medicine

## 2022-07-22 MED ORDER — METHYLPHENIDATE HCL ER (CD) 20 MG PO CPCR: 20.0000 mg | ORAL_CAPSULE | Freq: Every day | ORAL | 0 refills | Status: DC | PRN

## 2022-07-22 NOTE — Telephone Encounter (Signed)
Requesting: methylphenidate Contract: 01/26/21 UDS: 01/26/21 Last Visit: 05/31/22 Next Visit: 6 week f/u not completed Last Refill: 06/27/22 (30,0)  Please Advise. Med pending

## 2022-07-25 ENCOUNTER — Encounter: Payer: Self-pay | Admitting: Gastroenterology

## 2022-07-25 ENCOUNTER — Encounter: Payer: Self-pay | Admitting: Family Medicine

## 2022-07-26 NOTE — Patient Instructions (Incomplete)
   It was very nice to see you today!   PLEASE NOTE:   If you had any lab tests please let us know if you have not heard back within a few days. You may see your results on MyChart before we have a chance to review them but we will give you a call once they are reviewed by [place]. If we ordered any referrals today, please let us know if you have not heard from their office within the next 2 weeks. You should receive a letter via MyChart confirming if the referral was approved and their office contact information to schedule.  Please try these tips to maintain a healthy lifestyle:  Eat most of your calories during the day when you are active. Eliminate processed foods including packaged sweets (pies, cakes, cookies), reduce intake of potatoes, white bread, white pasta, and white rice. Look for whole grain options, oat flour or almond flour.  Each meal should contain half fruits/vegetables, one quarter protein, and one quarter carbs (no bigger than a computer mouse).  Cut down on sweet beverages. This includes juice, soda, and sweet tea. Also watch fruit intake, though this is a healthier sweet option, it still contains natural sugar! Limit to 3 servings daily.  Drink at least 1 glass of water with each meal and aim for at least 8 glasses per day  Exercise at least 150 minutes every week.

## 2022-07-29 ENCOUNTER — Ambulatory Visit (INDEPENDENT_AMBULATORY_CARE_PROVIDER_SITE_OTHER): Payer: Managed Care, Other (non HMO) | Admitting: Family Medicine

## 2022-07-29 ENCOUNTER — Encounter: Payer: Self-pay | Admitting: Family Medicine

## 2022-07-29 ENCOUNTER — Encounter: Payer: Self-pay | Admitting: Gastroenterology

## 2022-07-29 ENCOUNTER — Telehealth: Payer: Self-pay | Admitting: Family Medicine

## 2022-07-29 VITALS — BP 115/75 | HR 88 | Wt 214.6 lb

## 2022-07-29 DIAGNOSIS — Z7984 Long term (current) use of oral hypoglycemic drugs: Secondary | ICD-10-CM | POA: Diagnosis not present

## 2022-07-29 DIAGNOSIS — I82411 Acute embolism and thrombosis of right femoral vein: Secondary | ICD-10-CM

## 2022-07-29 DIAGNOSIS — K50111 Crohn's disease of large intestine with rectal bleeding: Secondary | ICD-10-CM

## 2022-07-29 DIAGNOSIS — M79661 Pain in right lower leg: Secondary | ICD-10-CM | POA: Diagnosis not present

## 2022-07-29 DIAGNOSIS — M7989 Other specified soft tissue disorders: Secondary | ICD-10-CM

## 2022-07-29 DIAGNOSIS — E1129 Type 2 diabetes mellitus with other diabetic kidney complication: Secondary | ICD-10-CM

## 2022-07-29 DIAGNOSIS — K50918 Crohn's disease, unspecified, with other complication: Secondary | ICD-10-CM

## 2022-07-29 DIAGNOSIS — K50119 Crohn's disease of large intestine with unspecified complications: Secondary | ICD-10-CM

## 2022-07-29 DIAGNOSIS — F988 Other specified behavioral and emotional disorders with onset usually occurring in childhood and adolescence: Secondary | ICD-10-CM

## 2022-07-29 MED ORDER — METHYLPHENIDATE HCL ER (CD) 20 MG PO CPCR: 20.0000 mg | ORAL_CAPSULE | Freq: Every day | ORAL | 0 refills | Status: DC | PRN

## 2022-07-29 MED ORDER — APIXABAN 5 MG PO TABS: 5.0000 mg | ORAL_TABLET | Freq: Two times a day (BID) | ORAL | 0 refills | Status: DC

## 2022-07-29 NOTE — Telephone Encounter (Signed)
Pt advised of options, he would like referral to [facility]

## 2022-07-29 NOTE — Telephone Encounter (Signed)
Pls call pt/wife and tell them I found some options for them:  [facility] [address], [place] [address]  And  [facility] multidisciplinary Inflammatory bowel disease center Division of Gastroenterology and Hepatology.  Let me know if they want me to order a referral.  Anytime.

## 2022-07-29 NOTE — Progress Notes (Signed)
OFFICE VISIT  07/29/2022  CC:  Chief Complaint  Patient presents with   Acute Visit    DVT concerns.    Patient is a 67 y.o. male who presents accompanied by his wife for right leg DVT concern. I last saw him 2 months ago. A/P as of that visit: "#1 Crohn's disease, improving with restart of prednisone 1 week ago. Decreased to 25 mg prednisone daily and decrease by 1 tab every 7 days. He will be getting his next Remicade infusion early next month.  He will come here for lab visit on 3/26--- Remicade labs, CBC, c-Met, sed rate. He has GI follow-up set for 06/24/2022.   #2 type 2 diabetes with history of microalbuminuria. Control not ideal but doing pretty well considering restart of prednisone recently. Continue 24 units of Tresiba every morning, Jardiance 10 mg a day, and metformin 1000 mg twice a day. Next A1c due around 08/24/22.  INTERIM HX: 04/12/2022 he was diagnosed with right leg DVT involving the femoral and popliteal veins. He took a 41-month course of Eliquis which ended about 2 weeks ago. He started to go without his compression stocking more lately since getting off the Eliquis and has noted some gradual discomfort and increased swelling in the right leg since then.  He put the compression stocking back on the last couple of days and it is slightly improved.  His glucoses have been in the 80s typically in the morning.  He wonders if he can cut back on his [place]. He has been off of his prednisone for a while now.  Recently started on methotrexate to go with his Remicade.  He has ongoing discomfort in his abdomen specifically related to hernia around his ostomy site and his large abdominal wound that still has a bit to heal up completely. Since getting on methotrexate he feels like he has improved some, less stooling and gas, more energy.  No nausea or vomiting. He has no blood in his stool.  ROS as above, plus--> no fevers, no CP, no SOB, no wheezing, no cough, no  dizziness, no HAs, no rashes, no melena/hematochezia.  No polyuria or polydipsia.  No myalgias or arthralgias.  No focal weakness, paresthesias, or tremors.  No acute vision or hearing abnormalities.  No dysuria or unusual/new urinary urgency or frequency.  No recent changes in lower legs.  No palpitations.     Past Medical History:  Diagnosis Date   Adult ADHD    Allergic rhinitis    Anxiety    claustrophobic   Bronchopneumonia 11/03/2013   Colon polyps 2012 and 2013   All hyperplastic except one adenomatous w/out high grade dysplasia (recall 2023 per Dr. Arlyce Dice)   COVID-19 virus infection 09/29/2020   Crohn disease (HCC)    Elevated transaminase level 2017   Viral hep screening neg.  Suspect fatty liver.   History of pneumonia    Hyperlipidemia    Hypertension    Inflammatory bowel disease    01/2022   Large bowel perforation (HCC) 02/2022   delayed, s/p colonoscopy w/biopsies (new dx crohn's/inflammatory BD)   Obesity    OSA on CPAP    Severe.  CPAP 10 cm H2O.  Follows Dr. Wynona Neat.   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 Maisie Fus did surg   Right leg DVT (HCC)    04/12/22 R fem and pop   Seborrheic dermatitis of scalp    and face: hydrocort 2% cream bid prn to face, and fluocinonide 0.5% sol'n to  scalp bid prn per Dr. Terri Piedra (04/2014)   Secondary male hypogonadism 04/2021   Type 2 diabetes mellitus with microalbuminuria (HCC) DM dx-09/27/2015. Microalb 2020    Past Surgical History:  Procedure Laterality Date   COLONOSCOPY W/ POLYPECTOMY  04/2010; 10/2011   Initial screening TCS showed 6 polyps (one adenomatous).  Pt says he returned for repeat TCS 10/2011 showed one hyperplastic polyp: Recall 10 yrs per Dr. Bryon Lions SIGMOIDOSCOPY N/A 02/25/2022   Procedure: FLEXIBLE SIGMOIDOSCOPY;  Surgeon: Lemar Lofty., MD;  Location: [facility];  Service: Gastroenterology;  Laterality: N/A;   FLEXIBLE SIGMOIDOSCOPY N/A 03/04/2022   Procedure: FLEXIBLE  SIGMOIDOSCOPY;  Surgeon: Shellia Cleverly, DO;  Location: [facility];  Service: Gastroenterology;  Laterality: N/A;   HEMOSTASIS CONTROL  02/25/2022   Procedure: HEMOSTASIS CONTROL;  Surgeon: Lemar Lofty., MD;  Location: [facility];  Service: Gastroenterology;;   HEMOSTASIS CONTROL  03/04/2022   Procedure: HEMOSTASIS CONTROL;  Surgeon: Shellia Cleverly, DO;  Location: [facility];  Service: Gastroenterology;;   INCISION AND DRAINAGE ABSCESS N/A 11/07/2021   Procedure: Incision and drainage of scrotal abscess;  Surgeon: Romie Levee, MD;  Location: [facility];  Service: General;  Laterality: N/A;   IR SINUS/FIST TUBE CHK-NON GI  03/06/2022   LAPAROTOMY N/A 02/07/2022   Procedure: EXPLORATORY LAPAROTOMY, COLON RESECTION, OSTOMY;  Surgeon: Romie Levee, MD;  Location: [facility];  Service: General;  Laterality: N/A;   MOUTH SURGERY     TRANSTHORACIC ECHOCARDIOGRAM     03/05/22 NORMAL    Outpatient Medications Prior to Visit  Medication Sig Dispense Refill   Accu-Chek Softclix Lancets lancets SMARTSIG:Topical 200 each 5   acetaminophen (TYLENOL) 325 MG tablet Take 2 tablets (650 mg total) by mouth every 4 (four) hours as needed for mild pain, moderate pain, fever or headache.     alum & mag hydroxide-simeth (MAALOX/MYLANTA) 200-200-20 MG/5ML suspension Take 30 mLs by mouth every 6 (six) hours as needed for indigestion or heartburn (or bloating). 355 mL 0   ascorbic acid (VITAMIN C) 500 MG tablet Take 1 tablet (500 mg total) by mouth 2 (two) times daily.     atorvastatin (LIPITOR) 40 MG tablet TAKE 1 TABLET BY MOUTH EVERY DAY 90 tablet 1   Continuous Blood Gluc Receiver (FREESTYLE LIBRE 3 READER) DEVI 1 each by Does not apply route every 14 (fourteen) days. 2 each 2   Continuous Blood Gluc Sensor (FREESTYLE LIBRE 3 SENSOR) MISC Place 1 sensor on the skin every 14 days. Use to check glucose continuously 2 each 2   diphenhydrAMINE (BENADRYL) 25 mg capsule Take 25 mg by mouth daily as  needed for allergies.     docusate sodium (COLACE) 100 MG capsule Take 100 mg by mouth daily.     empagliflozin (JARDIANCE) 10 MG TABS tablet Take 1 tablet (10 mg total) by mouth daily before breakfast. 30 tablet 0   glucose blood (ACCU-CHEK GUIDE) test strip 1 each by Other route 4 (four) times daily -  before meals and at bedtime. 300 strip 5   insulin degludec (TRESIBA FLEXTOUCH) 100 UNIT/ML FlexTouch Pen Inject 24 Units into the skin at bedtime. 21 mL 1   Insulin Pen Needle (AURORA PEN NEEDLES) 31G X 6 MM MISC 1 Application by Does not apply route at bedtime. 100 each 5   magnesium oxide (MAG-OX) 400 (240 Mg) MG tablet TAKE 2 TABLETS (800 MG TOTAL) BY MOUTH 3 (THREE) TIMES DAILY. 180 tablet 1   metFORMIN (GLUCOPHAGE) 1000 MG  tablet Take 1 tablet (1,000 mg total) by mouth 2 (two) times daily with a meal. 180 tablet 3   Methotrexate, PF, 15 MG/0.3ML SOAJ Inject 1 Pen into the skin once a week. 2.4 mL 3   Multiple Vitamin (MULTIVITAMIN WITH MINERALS) TABS tablet Take 1 tablet by mouth daily.     prochlorperazine (COMPAZINE) 5 MG tablet Take 1-2 tablets (5-10 mg total) by mouth every 6 (six) hours as needed for nausea. 30 tablet 0   simethicone (MYLICON) 40 MG/0.6ML drops Take 1.2 mLs (80 mg total) by mouth 4 (four) times daily as needed for flatulence (PRN bloating, gassiness). 30 mL 0   thiamine (VITAMIN B-1) 100 MG tablet Take 1 tablet (100 mg total) by mouth daily.     zinc sulfate 220 (50 Zn) MG capsule Take 1 capsule (220 mg total) by mouth daily. 30 capsule 0   methylphenidate (METADATE CD) 20 MG CR capsule Take 1 capsule (20 mg total) by mouth daily as needed. 30 capsule 0   furosemide (LASIX) 20 MG tablet Take 1 tablet (20 mg total) by mouth every other day. (Patient not taking: Reported on 07/29/2022) 60 tablet 3   pantoprazole (PROTONIX) 40 MG tablet Take 1 tablet (40 mg total) by mouth daily. (Patient not taking: Reported on 06/24/2022) 90 tablet 1   apixaban (ELIQUIS) 5 MG TABS tablet Take  1 tablet (5 mg total) by mouth 2 (two) times daily. (Patient not taking: Reported on 07/29/2022) 120 tablet 0   predniSONE (DELTASONE) 5 MG tablet 5 tabs qd x 7d, decrease by 1 tab every 7d (Patient not taking: Reported on 07/29/2022) 105 tablet 0   No facility-administered medications prior to visit.    Allergies  Allergen Reactions   Lobster [Shellfish Allergy] Nausea Only   Poison Ivy Extract Itching    Review of Systems  As per HPI  PE:    07/29/2022   11:24 AM 06/24/2022    2:29 PM 06/18/2022    1:15 PM  Vitals with BMI  Height  5\' 10"    Weight 214 lbs 10 oz 211 lbs   BMI  30.28   Systolic 115 126 161  Diastolic 75 60 77  Pulse 88 102 88     Physical Exam  Gen: Alert, well appearing.  Patient is oriented to person, place, time, and situation. AFFECT: pleasant, lucid thought and speech. R LE with trace pitting edema, mild inc circumference of calf compared to L (40.5 cm compared to 37.5). No tenderness or erythema.  LABS:  Last CBC Lab Results  Component Value Date   WBC 12.1 (H) 06/11/2022   HGB 13.2 06/11/2022   HCT 40.0 06/11/2022   MCV 91.5 06/11/2022   MCH 30.4 05/24/2022   RDW 14.9 06/11/2022   PLT 363.0 06/11/2022   Last metabolic panel Lab Results  Component Value Date   GLUCOSE 185 (H) 06/11/2022   NA 138 06/11/2022   K 4.4 06/11/2022   CL 102 06/11/2022   CO2 25 06/11/2022   BUN 14 06/11/2022   CREATININE 0.52 06/11/2022   GFRNONAA >60 04/12/2022   CALCIUM 9.7 06/11/2022   PHOS 2.3 (L) 03/01/2022   PROT 7.4 06/11/2022   ALBUMIN 3.8 06/11/2022   BILITOT 0.4 06/11/2022   ALKPHOS 122 (H) 06/11/2022   AST 18 06/11/2022   ALT 17 06/11/2022   ANIONGAP 7 04/12/2022   Last lipids Lab Results  Component Value Date   CHOL 109 07/12/2021   HDL 54.60 07/12/2021   LDLCALC  34 07/12/2021   LDLDIRECT 139.9 06/13/2011   TRIG 144 02/21/2022   CHOLHDL 2 07/12/2021   Last hemoglobin A1c Lab Results  Component Value Date   HGBA1C 7.0 (A)  05/24/2022   HGBA1C 7.0 05/24/2022   HGBA1C 7.0 (A) 05/24/2022   HGBA1C 7.0 05/24/2022   Last thyroid functions Lab Results  Component Value Date   TSH 2.58 03/10/2019   Last vitamin B12 and Folate Lab Results  Component Value Date   VITAMINB12 1,429 (H) 02/16/2022   FOLATE 7.0 12/13/2019   Lab Results  Component Value Date   DDIMER 11.05 (H) 02/07/2022   IMPRESSION AND PLAN:  #1 history of right femoral and popliteal vein DVT.  He has finished 3 months of Eliquis a couple of weeks ago.  Has had some return of swelling and pain since that time.  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 Will get venous ultrasound to rule out acute DVT or the presence of a significant obstructive chronic DVT. If we can get this in the next couple of days then we will hold off on Eliquis right now.  He goes out of town on a long car trip in 3 days so if we cannot get our ultrasound before then he will start Eliquis.  #2  Diabetes.  His control was worse in the context of his prolonged acute illness and steroid treatment for same. Now he is off steroids and glucoses are improving so he will gradually wean down his [place] to try to get sugars in the 100-120 range fasting consistently. Continue Jardiance 10 mg a day and metformin 1000 mg twice a day.  #3 adult ADD. Doing well long-term on Metadate CD 20 mg a day. Continue this--- prescription sent today, #30.  #4 Crohn's disease.  He has weaned off steroids. Has been getting Remicade. Methotrexate recently added.  He is improving some since then.  Spent 35 min with pt today reviewing HPI, reviewing relevant past history, doing exam, reviewing and discussing lab and imaging data, and formulating plans.  An After Visit Summary was printed and given to the patient.  FOLLOW UP: Return in about 4 weeks (around 08/26/2022) for routine chronic illness f/u.  Signed:  Santiago Bumpers, MD           07/29/2022

## 2022-07-30 ENCOUNTER — Telehealth: Payer: Self-pay

## 2022-07-30 ENCOUNTER — Ambulatory Visit (HOSPITAL_BASED_OUTPATIENT_CLINIC_OR_DEPARTMENT_OTHER)
Admission: RE | Admit: 2022-07-30 | Discharge: 2022-07-30 | Disposition: A | Discharge status: Home / Self Care | Payer: Managed Care, Other (non HMO) | Source: Ambulatory Visit | Attending: Family Medicine | Admitting: Family Medicine

## 2022-07-30 ENCOUNTER — Other Ambulatory Visit (HOSPITAL_COMMUNITY): Payer: Self-pay | Admitting: Nurse Practitioner

## 2022-07-30 ENCOUNTER — Ambulatory Visit (HOSPITAL_BASED_OUTPATIENT_CLINIC_OR_DEPARTMENT_OTHER)
Admission: RE | Admit: 2022-07-30 | Discharge: 2022-07-30 | Disposition: A | Discharge status: Home / Self Care | Payer: Managed Care, Other (non HMO) | Source: Ambulatory Visit

## 2022-07-30 DIAGNOSIS — K9400 Colostomy complication, unspecified: Secondary | ICD-10-CM

## 2022-07-30 DIAGNOSIS — K9409 Other complications of colostomy: Secondary | ICD-10-CM

## 2022-07-30 DIAGNOSIS — I82411 Acute embolism and thrombosis of right femoral vein: Secondary | ICD-10-CM | POA: Insufficient documentation

## 2022-07-30 DIAGNOSIS — K9402 Colostomy infection: Secondary | ICD-10-CM

## 2022-07-30 MED ORDER — CEPHALEXIN 250 MG PO CAPS: 500.0000 mg | ORAL_CAPSULE | Freq: Two times a day (BID) | ORAL | 0 refills | Status: AC

## 2022-07-30 NOTE — Telephone Encounter (Signed)
PA status: pending

## 2022-07-30 NOTE — Progress Notes (Signed)
[facility]   Reason for visit:  LMQ colostomy with peristomal breakdown from 11 to 12 o'clock, full thickness tissue loss, purulence, deep erythema and tenderness present HPI:  Crohn's colitis with resection and colostomy Past Medical History:  Diagnosis Date   Adult ADHD    Allergic rhinitis    Bronchopneumonia 11/03/2013   Colon polyps 2012 and 2013   All hyperplastic except one adenomatous w/out high grade dysplasia (recall 2023 per Dr. Arlyce Dice)   COVID-19 virus infection 09/29/2020   Crohn disease (HCC)    Elevated transaminase level 2017   Viral hep screening neg.  Suspect fatty liver.   History of pneumonia    Hyperlipidemia    Hypertension    Large bowel perforation (HCC) 02/2022   delayed, s/p colonoscopy w/biopsies (new dx crohn's/inflammatory BD)   Obesity    OSA on CPAP    Severe.  CPAP 10 cm H2O.  Follows Dr. Wynona Neat.   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 Maisie Fus did surg   Rectal varices    +rectal bleed while in hosp 2023   Right leg DVT (HCC)    04/12/22 R fem and pop   Seborrheic dermatitis of scalp    and face: hydrocort 2% cream bid prn to face, and fluocinonide 0.5% sol'n to scalp bid prn per Dr. Terri Piedra (04/2014)   Secondary male hypogonadism 04/2021   Type 2 diabetes mellitus with microalbuminuria (HCC) DM dx-09/27/2015. Microalb 2020   Family History  Problem Relation Age of Onset   Lung cancer Father    Cancer Father        nasal   Allergies Father    Colon cancer Neg Hx    Esophageal cancer Neg Hx    Rectal cancer Neg Hx    Stomach cancer Neg Hx    Allergies  Allergen Reactions   Lobster [Shellfish Allergy] Nausea Only   Poison Ivy Extract Itching   Current Outpatient Medications  Medication Sig Dispense Refill Last Dose   cephALEXin (KEFLEX) 250 MG capsule Take 2 capsules (500 mg total) by mouth 2 (two) times daily for 7 days. 28 capsule 0    Accu-Chek Softclix Lancets lancets SMARTSIG:Topical 200 each 5     acetaminophen (TYLENOL) 325 MG tablet Take 2 tablets (650 mg total) by mouth every 4 (four) hours as needed for mild pain, moderate pain, fever or headache.      alum & mag hydroxide-simeth (MAALOX/MYLANTA) 200-200-20 MG/5ML suspension Take 30 mLs by mouth every 6 (six) hours as needed for indigestion or heartburn (or bloating). 355 mL 0    apixaban (ELIQUIS) 5 MG TABS tablet Take 1 tablet (5 mg total) by mouth 2 (two) times daily. 60 tablet 0    ascorbic acid (VITAMIN C) 500 MG tablet Take 1 tablet (500 mg total) by mouth 2 (two) times daily.      atorvastatin (LIPITOR) 40 MG tablet TAKE 1 TABLET BY MOUTH EVERY DAY 90 tablet 1    Continuous Blood Gluc Receiver (FREESTYLE LIBRE 3 READER) DEVI 1 each by Does not apply route every 14 (fourteen) days. 2 each 2    Continuous Blood Gluc Sensor (FREESTYLE LIBRE 3 SENSOR) MISC Place 1 sensor on the skin every 14 days. Use to check glucose continuously 2 each 2    diphenhydrAMINE (BENADRYL) 25 mg capsule Take 25 mg by mouth daily as needed for allergies.      docusate sodium (COLACE) 100 MG capsule Take 100 mg by mouth daily.  empagliflozin (JARDIANCE) 10 MG TABS tablet Take 1 tablet (10 mg total) by mouth daily before breakfast. 30 tablet 0    furosemide (LASIX) 20 MG tablet Take 1 tablet (20 mg total) by mouth every other day. (Patient not taking: Reported on 07/29/2022) 60 tablet 3    glucose blood (ACCU-CHEK GUIDE) test strip 1 each by Other route 4 (four) times daily -  before meals and at bedtime. 300 strip 5    insulin degludec (TRESIBA FLEXTOUCH) 100 UNIT/ML FlexTouch Pen Inject 24 Units into the skin at bedtime. 21 mL 1    Insulin Pen Needle (AURORA PEN NEEDLES) 31G X 6 MM MISC 1 Application by Does not apply route at bedtime. 100 each 5    magnesium oxide (MAG-OX) 400 (240 Mg) MG tablet TAKE 2 TABLETS (800 MG TOTAL) BY MOUTH 3 (THREE) TIMES DAILY. 180 tablet 1    metFORMIN (GLUCOPHAGE) 1000 MG tablet Take 1 tablet (1,000 mg total) by mouth 2  (two) times daily with a meal. 180 tablet 3    Methotrexate, PF, 15 MG/0.3ML SOAJ Inject 1 Pen into the skin once a week. 2.4 mL 3    methylphenidate (METADATE CD) 20 MG CR capsule Take 1 capsule (20 mg total) by mouth daily as needed. 30 capsule 0    Multiple Vitamin (MULTIVITAMIN WITH MINERALS) TABS tablet Take 1 tablet by mouth daily.      pantoprazole (PROTONIX) 40 MG tablet Take 1 tablet (40 mg total) by mouth daily. (Patient not taking: Reported on 06/24/2022) 90 tablet 1    prochlorperazine (COMPAZINE) 5 MG tablet Take 1-2 tablets (5-10 mg total) by mouth every 6 (six) hours as needed for nausea. 30 tablet 0    simethicone (MYLICON) 40 MG/0.6ML drops Take 1.2 mLs (80 mg total) by mouth 4 (four) times daily as needed for flatulence (PRN bloating, gassiness). 30 mL 0    thiamine (VITAMIN B-1) 100 MG tablet Take 1 tablet (100 mg total) by mouth daily.      zinc sulfate 220 (50 Zn) MG capsule Take 1 capsule (220 mg total) by mouth daily. 30 capsule 0    No current facility-administered medications for this encounter.   ROS  Review of Systems  Constitutional:  Positive for fatigue.  Gastrointestinal:        Colostomy with peristomal breakdown Large abdominal hernia  Skin:  Positive for wound.  Hematological:        Recent DVT right leg  Psychiatric/Behavioral: Negative.    All other systems reviewed and are negative.  Vital signs:  BP 118/74 (BP Location: Right Arm)   Pulse (!) 108   Temp 98.1 F (36.7 C) (Oral)   Resp 20   SpO2 96%  Exam:  Physical Exam Vitals reviewed.  Constitutional:      Appearance: He is obese.  Abdominal:     Hernia: A hernia is present.  Skin:    General: Skin is warm and dry.     Findings: Lesion present.  Neurological:     Mental Status: He is alert and oriented to person, place, and time.  Psychiatric:        Mood and Affect: Mood normal.        Behavior: Behavior normal.     Stoma type/location:  LMQ colostomy Stomal assessment/size:  1  3/8" round Peristomal assessment:  open wound to top of pouching area.   Treatment options for stomal/peristomal skin: barrier ring and 2 piece pouch. He is now able to empty  himself.  Output: soft brown stool Ostomy pouching: 2pc. Pouch with barrier ring  Education provided:  wound care will include aquacel to open area (samples provided)  due to purulent effluent, pain and redness, I am calling in Keflex 500Mg  BID for 7 days. IF wound does not heal, we will need to consider a more flexible pouching option.     Impression/dx  Peristomal breakdown Colostomy complication Abdominal hernia Discussion  Going out of town, giving antibiotics and antimicrobial wound care (aquacel covered with barrier ring with each pouch change)  Plan  See back as needed.     Visit time: 55 minutes.   Maple Hudson FNP-BC

## 2022-07-30 NOTE — Telephone Encounter (Signed)
Patient had STAT DVT today.  Patient calling to see if results were available.  Patient has mychart but not able to see right now.  I told patient Dr. Milinda Cave left office at noon today.  Patient said they were leaving to go to [place] tomorrow morning. Results were available in his mychart.  Patient asked that I read to him what it said.  I called Dr. Milinda Cave on his cell to contact patient with results.  He said he would call him immediately.

## 2022-08-01 ENCOUNTER — Other Ambulatory Visit: Payer: Self-pay | Admitting: Family Medicine

## 2022-08-05 DIAGNOSIS — K9412 Enterostomy infection: Secondary | ICD-10-CM | POA: Insufficient documentation

## 2022-08-05 DIAGNOSIS — K9402 Colostomy infection: Secondary | ICD-10-CM | POA: Insufficient documentation

## 2022-08-05 DIAGNOSIS — K9409 Other complications of colostomy: Secondary | ICD-10-CM | POA: Insufficient documentation

## 2022-08-05 NOTE — Discharge Instructions (Signed)
Aquacel to open wound Pick up antibiotic

## 2022-08-09 ENCOUNTER — Encounter: Payer: Self-pay | Admitting: Gastroenterology

## 2022-08-10 ENCOUNTER — Encounter: Payer: Self-pay | Admitting: Gastroenterology

## 2022-08-10 ENCOUNTER — Other Ambulatory Visit (HOSPITAL_COMMUNITY): Payer: Self-pay

## 2022-08-13 ENCOUNTER — Ambulatory Visit (INDEPENDENT_AMBULATORY_CARE_PROVIDER_SITE_OTHER): Payer: Managed Care, Other (non HMO)

## 2022-08-13 VITALS — BP 108/69 | HR 81 | Temp 98.8°F | Resp 18 | Ht 70.0 in | Wt 207.8 lb

## 2022-08-13 DIAGNOSIS — K50118 Crohn's disease of large intestine with other complication: Secondary | ICD-10-CM

## 2022-08-13 DIAGNOSIS — K50119 Crohn's disease of large intestine with unspecified complications: Secondary | ICD-10-CM | POA: Diagnosis not present

## 2022-08-13 MED ORDER — METHYLPREDNISOLONE SODIUM SUCC 40 MG IJ SOLR
40.0000 mg | Freq: Once | INTRAMUSCULAR | Status: AC
  Administered 2022-08-13: 40 mg via INTRAVENOUS
  Filled 2022-08-13: qty 1

## 2022-08-13 MED ORDER — DIPHENHYDRAMINE HCL 25 MG PO CAPS
25.0000 mg | ORAL_CAPSULE | Freq: Once | ORAL | Status: AC
  Administered 2022-08-13: 25 mg via ORAL
  Filled 2022-08-13: qty 1

## 2022-08-13 MED ORDER — SODIUM CHLORIDE 0.9 % IV SOLN
10.0000 mg/kg | INTRAVENOUS | Status: DC
  Administered 2022-08-13: 900 mg via INTRAVENOUS
  Filled 2022-08-13: qty 90

## 2022-08-13 MED ORDER — ACETAMINOPHEN 325 MG PO TABS
650.0000 mg | ORAL_TABLET | Freq: Once | ORAL | Status: AC
  Administered 2022-08-13: 650 mg via ORAL
  Filled 2022-08-13: qty 2

## 2022-08-13 NOTE — Progress Notes (Signed)
Diagnosis: Crohn's Disease  Provider:  Chilton Greathouse MD  Procedure: IV Infusion  IV Type: Peripheral, IV Location: L Forearm  Remicade (Infliximab), Dose: 900mg   Infusion Start Time: 1028  Infusion Stop Time: 1245  Post Infusion IV Care: Peripheral IV Discontinued  Discharge: Condition: Good, Destination: Home . AVS Provided  Performed by:  Marilynn Rail, RN

## 2022-08-15 ENCOUNTER — Telehealth: Payer: Self-pay | Admitting: Pharmacy Technician

## 2022-08-15 NOTE — Telephone Encounter (Addendum)
Patient will have new insurance starting 08/17/22  Medicare A/B and AARP supp. Will need to scan new insurance cards when patient arrives for upcoming appt.09/2022  Auth Submission: NO AUTH NEEDED Site of care: Site of care: [facility] Payer: MEDICARE A/B & AARP Medication & CPT/J Code(s) submitted: Remicade (Infliximab) J1745 Route of submission (phone, fax, portal):  Phone # Fax # Auth type: Buy/Bill Units/visits requested: 10MG /KG  900MG  [id number]  Reference number:  Approval from: 08/28/22 to 03/18/23

## 2022-08-16 ENCOUNTER — Encounter: Payer: Managed Care, Other (non HMO) | Attending: Gastroenterology | Admitting: Dietician

## 2022-08-16 VITALS — Ht 70.0 in | Wt 207.0 lb

## 2022-08-16 DIAGNOSIS — E119 Type 2 diabetes mellitus without complications: Secondary | ICD-10-CM | POA: Diagnosis present

## 2022-08-16 DIAGNOSIS — K50118 Crohn's disease of large intestine with other complication: Secondary | ICD-10-CM | POA: Diagnosis present

## 2022-08-16 NOTE — Progress Notes (Signed)
Medical Nutrition Therapy  Appointment Start time:  1600  Appointment End time:  1645 Patient is here today with his wife.  Primary concerns today: Richard [name] has a new diagnosis of Crohn's 01/2023 and had resulting complications resulting in a long hospital stay.  He now has a colostomy.  Surgical wound from 02/08/2022 is still not completely healed.  Increased abdominal hernia.  He is having small wounds where the Colostomy pouch sticks.  He has seen an Geneticist, molecular.   He has had excessive gas and diarrhea.  He also has diabetes and wonder's how to manage this with these other health issues. He has lost 50 lbs since his diagnosis.  They would also like to be sure of nutrition adequacy. Increased fatigue and increased headaches. Low sensor reading of 67 in office treated with juice and now 97.  Referral diagnosis: Crohn's  Preferred learning style:  no preference indicated Learning readiness: ready   NUTRITION ASSESSMENT   Anthropometrics  70" 207 lbs 07/29/2022 252 lbs 01/2022 270 lbs highest adult weight 2020   Clinical Medical Hx: Crohn's, type 2 Diabetes, OSA on C-pap, HTN, HLD Medications: Metformin, Jardiance, Methotrexate, Remicade IV, Tresiba 18 units q HS, simethicone Labs: A1C 7% increased from 6.1% 02/28/2022 Notable Signs/Symptoms: see above  CGM Results from download:  Libre 3 08/16/2022  % Time CGM active:   98 %   (Goal >70%)  Average glucose:   110 mg/dL for 14 days  Glucose management indicator:   5.8 %  Time in range (70-180 mg/dL):   85 %   (Goal >16%)  Time High (181-250 mg/dL):   2 %   (Goal < 10%)  Time Very High (>250 mg/dL):    0 %   (Goal < 5%)  Time Low (54-69 mg/dL):   1 %   (Goal <9%)  Time Very Low (<54 mg/dL):   0 %   (Goal <6%)  Fasting blood glucose:    Lifestyle & Dietary Hx Patient lives with his wife.  They both shop and cook and enjoy cooking.  He is retired. He does Education officer, community for [employer]. Estimated daily fluid intake: >64 oz  daily Supplements: magnesium, zinc, vitamin C,thiamine iron Sleep: excellent on C-pap Stress / self-care: handles stress well Current average weekly physical activity: walking with a cane.  Increased fatigue.  Felt well enough to mow his yard today on a rider.   24-Hr Dietary Recall First Meal: Neeses sausage, english muffin, egg, slice of  American cheese Snack: none Second Meal: McDonald's Double cheeseburger and small amount fries (left part of bun Snack: none Third Meal: BLT on [employer] bread (2), Rice Krispy Treat Snack: none Beverages: water 8-16 cups, unsweetened regular tea, black coffee (regular), 2% milk  Estimated Energy Needs Calories: 2500-2600 Protein: 110-120g  NUTRITION DIAGNOSIS  NB-1.1 Food and nutrition-related knowledge deficit As related to colostomy and newly diagnosed crohn's disease.  As evidenced by diet hx and patient report.   NUTRITION INTERVENTION  Nutrition education (E-1) on the following topics:  Discussed patient current symptoms and concerns Tips for diet with a colostomy to decrease gas Discussed diet for crohn's to avoid high fat and spicy foods Discussed supplementation Discussed fiber, sources and when to include and when to avoid Journaling diet and symptoms Blood glucose control and implication of diabetes/crohn's to diet recomendations  Handouts Provided Include  Colostomy nutrition therapy from AND Inflammatory Bowel Disease (IBD): Crohn's Disease And Ulcerative Colitis Nutrition Therapy from AND  Learning Style & Readiness for  Change Teaching method utilized: Visual & Auditory  Demonstrated degree of understanding via: Teach Back  Barriers to learning/adherence to lifestyle change: none  Goals Established by Pt Choose a diet that is low in fat and not spicy. Consider adding a supplement such as Ensure or other supplement as needed to maintain weight Foods with fiber only as tolerated. See handouts. Ask your doctor about a Calcium  with Vitamin D supplement. Listen to your body.  Keep records of your intake and also symptoms.  MONITORING & EVALUATION Dietary intake, weekly physical activity, and symptoms prn.  Next Steps  Patient is to call for question.

## 2022-08-16 NOTE — Patient Instructions (Addendum)
Choose a diet that is low in fat and not spicy. Consider adding a supplement such as Ensure or other supplement as needed to maintain weight Foods with fiber only as tolerated. See handouts. Ask your doctor about a Calcium with Vitamin D supplement. Listen to your body.  Keep records of your intake and also symptoms.

## 2022-08-20 ENCOUNTER — Encounter: Payer: Self-pay | Admitting: Gastroenterology

## 2022-08-20 ENCOUNTER — Ambulatory Visit: Payer: Medicare Other | Admitting: Gastroenterology

## 2022-08-20 VITALS — BP 122/84 | HR 86 | Ht 70.0 in | Wt 209.0 lb

## 2022-08-20 DIAGNOSIS — K9419 Other complications of enterostomy: Secondary | ICD-10-CM

## 2022-08-20 DIAGNOSIS — I824Y9 Acute embolism and thrombosis of unspecified deep veins of unspecified proximal lower extremity: Secondary | ICD-10-CM

## 2022-08-20 DIAGNOSIS — D509 Iron deficiency anemia, unspecified: Secondary | ICD-10-CM

## 2022-08-20 DIAGNOSIS — K50118 Crohn's disease of large intestine with other complication: Secondary | ICD-10-CM | POA: Diagnosis not present

## 2022-08-20 NOTE — Patient Instructions (Addendum)
You have been scheduled for an appointment with Dr. Barron Alvine on 11/26/22 at 10:40 AM . Please arrive 10 minutes early for your appointment.  _______________________________________________________  If your blood pressure at your visit was 140/90 or greater, please contact your primary care physician to follow up on this.  _______________________________________________________  If you are age 66 or older, your body mass index should be between 23-30. Your Body mass index is 29.99 kg/m. If this is out of the aforementioned range listed, please consider follow up with your Primary Care Provider. ________________________________________________________  The [facility] providers would like to encourage you to use [facility] to communicate with providers for non-urgent requests or questions.  Due to long hold times on the telephone, sending your provider a message by [facility] may be a faster and more efficient way to get a response.  Please allow 48 business hours for a response.  Please remember that this is for non-urgent requests.   Due to recent changes in healthcare laws, you may see the results of your imaging and laboratory studies on MyChart before your provider has had a chance to review them.  We understand that in some cases there may be results that are confusing or concerning to you. Not all laboratory results come back in the same time frame and the provider may be waiting for multiple results in order to interpret others.  Please give [place] 48 hours in order for your provider to thoroughly review all the results before contacting the office for clarification of your results.    Thank you for choosing me and [facility].  Vito Cirigliano, D.O.

## 2022-08-20 NOTE — Progress Notes (Unsigned)
Chief Complaint:    Crohn's Disease   GI History: 67 year old male with advanced phenotypic, penetrating colonic Crohn's Disease. - 02/04/2022: Colonoscopy: severe colitis with deeply cratered ulcers throughout the colon and mucosal friability and normal TI (path: Chronic active colitis).  - 02/07/2022: Admitted with sigmoid perforation, requiring emergent surgery with descending/sigmoid colectomy, transverse colostomy. - 02/25/2022: Active lower GI bleed.  Sigmoidoscopy performed emergently and notable for deep cratered rectal ulcers with bleeding stigmata, treated with PuraSTAT gel - 02/26/2022: IR drainage large LLQ abscess, grew E coli, bacteroides.  Treated with Flagyl, Rocephin.  Drain interrogated (no fistula, collapsed/resolved abscess) and removed on 12/20.   -03/04/2022: Repeat flexible sigmoidoscopy for recurrent bleed.  Cratered ulcers, but overall improved from the previous sigmoidoscopy.  Again treated any stigmata with PuraSTAT gel with resolution -03/06/2022: Started Remicade along with short-chain fatty acid enemas x 2 weeks for possible overlapping diversion colitis. -03/07/2022: Changed IV Solu-Medrol to prednisone 40 mg/day.  Plan for continued Flagyl low-dose x 4 weeks.  Surgery recommended against repeat colonoscopy via ostomy that close to surgery. - 03/20/2022: Remicade dose #2 and started prednisone taper - 03/25/2022: Discharged from inpatient rehab to home - 04/12/2022: RLE DVT.  Started on Eliquis x 3 months - 05/24/2022: Follow-up in Colorectal Surgery. Opinion is that ostomy reversal would be difficult due to size, large ventral hernia, ostomy, IBD. - 05/31/2022: Fecal calprotectin 15 (from 3570 at diagnosis) - 06/11/2022: Infliximab antibody 67, drug trough <0.4.   - 06/24/2022: Follow-up in GI clinic. Increased Remicade to 10 mg/kg, added MTX 15 mg SQ and folic acid   HPI:     Patient is a 67 y.o. male presenting to the Gastroenterology Clinic for follow-up.  Was last  seen by me in the office on 06/24/2022.  He had follow-up with Dr. Milinda Cave on 07/29/2022.  He has completed his Eliquis and continues compression stockings.  Dr. Milinda Cave placed a referral to [facility] IBD multidisciplinary clinic.  Repeat ultrasound without e/o acute DVT and resolution of the previously noted right DVT.    Was seen in the Nutrition Clinic on 08/16/2022.  Otherwise, no new labs or abdominal imaging since last appointment.  States he feels better now after increasing Remicade (first dose of 10 mg/kg was last week) and adding MTX.  Still with fatigue but improving.  Continues to deal with a peristomal ulcer/sore along with slow healing of surgical incision.  Had f/u in the wound/ostomy clinic on 5/14 and prescribed Abx for skin tear and concern for infection. Viewed pictures on phone today.   Still with increased gas via ostomy, but stool is more pasty rather than watery now.  No blood.  Very minimal rectal output since adding MTX which he is quite pleased about.  He and his wife did travel to [place] to see grandchildren last month and very much enjoyed their trip.   Review of systems:     No chest pain, no SOB, no fevers, no urinary sx   Past Medical History:  Diagnosis Date   Adult ADHD    Allergic rhinitis    Bronchopneumonia 11/03/2013   Colon polyps 2012 and 2013   All hyperplastic except one adenomatous w/out high grade dysplasia (recall 2023 per Dr. Arlyce Dice)   COVID-19 virus infection 09/29/2020   Crohn disease (HCC)    Elevated transaminase level 2017   Viral hep screening neg.  Suspect fatty liver.   History of pneumonia    Hyperlipidemia    Hypertension  Large bowel perforation (HCC) 02/2022   delayed, s/p colonoscopy w/biopsies (new dx crohn's/inflammatory BD)   Obesity    OSA on CPAP    Severe.  CPAP 10 cm H2O.  Follows Dr. Wynona Neat.   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 Maisie Fus did surg   Rectal varices    +rectal  bleed while in hosp 2023   Right leg DVT (HCC)    04/12/22 R fem and pop   Seborrheic dermatitis of scalp    and face: hydrocort 2% cream bid prn to face, and fluocinonide 0.5% sol'n to scalp bid prn per Dr. Terri Piedra (04/2014)   Secondary male hypogonadism 04/2021   Type 2 diabetes mellitus with microalbuminuria (HCC) DM dx-09/27/2015. Microalb 2020    Patient's surgical history, family medical history, social history, medications and allergies were all reviewed in Epic    Current Outpatient Medications  Medication Sig Dispense Refill   Accu-Chek Softclix Lancets lancets SMARTSIG:Topical 200 each 5   acetaminophen (TYLENOL) 325 MG tablet Take 2 tablets (650 mg total) by mouth every 4 (four) hours as needed for mild pain, moderate pain, fever or headache.     ascorbic acid (VITAMIN C) 500 MG tablet Take 1 tablet (500 mg total) by mouth 2 (two) times daily.     aspirin EC 81 MG tablet Take 81 mg by mouth daily. Swallow whole.     atorvastatin (LIPITOR) 40 MG tablet TAKE 1 TABLET BY MOUTH EVERY DAY 90 tablet 1   Continuous Blood Gluc Receiver (FREESTYLE LIBRE 3 READER) DEVI 1 each by Does not apply route every 14 (fourteen) days. 2 each 2   Continuous Blood Gluc Sensor (FREESTYLE LIBRE 3 SENSOR) MISC Place 1 sensor on the skin every 14 days. Use to check glucose continuously 2 each 2   empagliflozin (JARDIANCE) 10 MG TABS tablet Take 1 tablet (10 mg total) by mouth daily before breakfast. 30 tablet 0   ferrous sulfate 325 (65 FE) MG EC tablet Take 325 mg by mouth 3 (three) times daily with meals.     glucose blood (ACCU-CHEK GUIDE) test strip 1 each by Other route 4 (four) times daily -  before meals and at bedtime. 300 strip 5   inFLIXimab (REMICADE IV) Inject into the vein.     insulin degludec (TRESIBA FLEXTOUCH) 100 UNIT/ML FlexTouch Pen Inject 24 Units into the skin at bedtime. 21 mL 1   Insulin Pen Needle (AURORA PEN NEEDLES) 31G X 6 MM MISC 1 Application by Does not apply route at bedtime.  100 each 5   magnesium oxide (MAG-OX) 400 (240 Mg) MG tablet TAKE 2 TABLETS (800 MG TOTAL) BY MOUTH 3 (THREE) TIMES DAILY. 180 tablet 1   metFORMIN (GLUCOPHAGE) 1000 MG tablet Take 1 tablet (1,000 mg total) by mouth 2 (two) times daily with a meal. 180 tablet 3   Methotrexate, PF, 15 MG/0.3ML SOAJ Inject 1 Pen into the skin once a week. 2.4 mL 3   methylphenidate (METADATE CD) 20 MG CR capsule Take 1 capsule (20 mg total) by mouth daily as needed. 30 capsule 0   Multiple Vitamin (MULTIVITAMIN WITH MINERALS) TABS tablet Take 1 tablet by mouth daily.     thiamine (VITAMIN B-1) 100 MG tablet Take 1 tablet (100 mg total) by mouth daily.     zinc sulfate 220 (50 Zn) MG capsule Take 1 capsule (220 mg total) by mouth daily. 30 capsule 0   apixaban (ELIQUIS) 5 MG TABS tablet Take 1 tablet (5 mg total)  by mouth 2 (two) times daily. (Patient not taking: Reported on 08/16/2022) 60 tablet 0   furosemide (LASIX) 20 MG tablet Take 1 tablet (20 mg total) by mouth every other day. (Patient not taking: Reported on 07/29/2022) 60 tablet 3   No current facility-administered medications for this visit.    Physical Exam:     BP 122/84   Pulse 86   Ht 5\' 10"  (1.778 m)   Wt 209 lb (94.8 kg)   BMI 29.99 kg/m   GENERAL:  Pleasant male in NAD PSYCH: : Cooperative, normal affect NEURO: Alert and oriented x 3   IMPRESSION and PLAN:    1) Crohn's disease 67 year old male with severe, penetrating Crohn's Disease diagnosed in 01/2022, complicated by perforated sigmoid requiring emergent partial colectomy and transverse colostomy with prolonged hospital course.  Was partially responsive to Remicade, but infliximab trough undetectable with low antibody level (67), and we elected to add methotrexate in 06/2022 and increase Remicade to 10mg /kg with first dose being last week.  He has had overall clinical improvement since adding the MTX and changing to high-dose Remicade. - Continue Remicade - Continue methotrexate at  15 mg SQ weekly for at least 8 weeks, then can consider changing to oral regimen (15 mg p.o. weekly) when he presumably has improved small bowel absorption capacity - Discussed role/utility of referral to [facility] Multidisciplinary IBD clinic.  I think that is a very reasonable request for evaluation/treatment of his complex Crohn's Disease and would allow him to be evaluated in the IBD clinic along with surgical clinic, to include ancillary support services.  Will assist in any records, notes, etc. that he needs - Continue folic acid supplement - Check infliximab trough and antibody prior to third dose of high-dose infliximab - Check fecal calprotectin in the next 2-3 months as surrogate marker for inflammation - Per previous discussion with Colorectal Surgery, holding off on repeat colonoscopy at this juncture  2) Ostomy in place - Continue follow-up in the wound/ostomy clinic - Follow-up with Dr. Maisie Fus in Colorectal Surgery to again discuss whether or not reversal is an option if his Crohn's can get into remission - As above, if planning on evaluation at [facility], he can also discuss his case with Dr. Byrd Hesselbach and Dr. Drue Dun  3) Iron deficiency anemia - Resolved - Repeat CBC and iron panel in 2 months or so  4) LE DVT - Completed 31-month course of Eliquis  I spent 40 minutes of time, including in depth chart review, independent review of results as outlined above, communicating results with the patient directly, face-to-face time with the patient, coordinating care, ordering studies and medications as appropriate, and documentation.       Shellia Cleverly ,DO, FACG 08/20/2022, 11:27 AM

## 2022-08-22 NOTE — Patient Instructions (Signed)
   It was very nice to see you today!   Please schedule an appointment for yearly diabetic eye exam. If you do not have a eye doctor currently and would like [place] to send a referral, please let us know.  PLEASE NOTE:   If you had any lab tests please let us know if you have not heard back within a few days. You may see your results on MyChart before we have a chance to review them but we will give you a call once they are reviewed by [place]. If we ordered any referrals today, please let us know if you have not heard from their office within the next 2 weeks. You should receive a letter via MyChart confirming if the referral was approved and their office contact information to schedule.

## 2022-08-26 ENCOUNTER — Ambulatory Visit (INDEPENDENT_AMBULATORY_CARE_PROVIDER_SITE_OTHER): Payer: Medicare Other | Admitting: Family Medicine

## 2022-08-26 ENCOUNTER — Encounter: Payer: Self-pay | Admitting: Gastroenterology

## 2022-08-26 ENCOUNTER — Encounter: Payer: Self-pay | Admitting: Family Medicine

## 2022-08-26 VITALS — BP 101/68 | HR 76 | Ht 70.0 in | Wt 204.2 lb

## 2022-08-26 DIAGNOSIS — Z7984 Long term (current) use of oral hypoglycemic drugs: Secondary | ICD-10-CM

## 2022-08-26 DIAGNOSIS — R809 Proteinuria, unspecified: Secondary | ICD-10-CM | POA: Diagnosis not present

## 2022-08-26 DIAGNOSIS — E1129 Type 2 diabetes mellitus with other diabetic kidney complication: Secondary | ICD-10-CM | POA: Diagnosis not present

## 2022-08-26 DIAGNOSIS — Z794 Long term (current) use of insulin: Secondary | ICD-10-CM

## 2022-08-26 LAB — BASIC METABOLIC PANEL
BUN: 11 mg/dL (ref 6–23)
CO2: 25 mEq/L (ref 19–32)
Calcium: 10.1 mg/dL (ref 8.4–10.5)
Chloride: 103 mEq/L (ref 96–112)
Creatinine, Ser: 0.44 mg/dL (ref 0.40–1.50)
GFR: 110.56 mL/min (ref >60.00)
Glucose, Bld: 79 mg/dL (ref 70–99)
Potassium: 3.9 mEq/L (ref 3.5–5.1)
Sodium: 139 mEq/L (ref 135–145)

## 2022-08-26 LAB — POCT GLYCOSYLATED HEMOGLOBIN (HGB A1C)
HbA1c POC (<> result, manual entry): 5.4 % (ref 4.0–5.6)
HbA1c, POC (controlled diabetic range): 5.4 % (ref 0.0–7.0)
HbA1c, POC (prediabetic range): 5.4 % — AB (ref 5.7–6.4)
Hemoglobin A1C: 5.4 % (ref 4.0–5.6)

## 2022-08-26 MED ORDER — EMPAGLIFLOZIN 10 MG PO TABS: 10.0000 mg | ORAL_TABLET | Freq: Every day | ORAL | 1 refills | Status: DC

## 2022-08-26 NOTE — Progress Notes (Signed)
OFFICE VISIT  08/26/2022  CC:  Chief Complaint  Patient presents with   Medical Management of Chronic Issues    Pt is fasting   Patient is a 67 y.o. male who presents for follow-up diabetes.  INTERIM HX: Using the continuous glucose monitoring system he has noted great improvement in his glucoses at home.  He has decreased insulin down to 16 units of [place] daily.  He has an occasional glucose down in the 60s. He feels very well, appetite is getting better, stools mostly "pudding" consistency.  He has no blood in stool and no abdominal pain. His Remicade dose was doubled and methotrexate were added by Dr. Barron Alvine on 06/24/2022. Patient notes this has helped quite a bit.  Past Medical History:  Diagnosis Date   Adult ADHD    Allergic rhinitis    Bronchopneumonia 11/03/2013   Colon polyps 2012 and 2013   All hyperplastic except one adenomatous w/out high grade dysplasia (recall 2023 per Dr. Arlyce Dice)   COVID-19 virus infection 09/29/2020   Crohn disease (HCC)    Elevated transaminase level 2017   Viral hep screening neg.  Suspect fatty liver.   History of pneumonia    Hyperlipidemia    Hypertension    Large bowel perforation (HCC) 02/2022   delayed, s/p colonoscopy w/biopsies (new dx crohn's/inflammatory BD)   Obesity    OSA on CPAP    Severe.  CPAP 10 cm H2O.  Follows Dr. Wynona Neat.   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 Maisie Fus did surg   Rectal varices    +rectal bleed while in hosp 2023   Right leg DVT (HCC)    04/12/22 R fem and pop   Seborrheic dermatitis of scalp    and face: hydrocort 2% cream bid prn to face, and fluocinonide 0.5% sol'n to scalp bid prn per Dr. Terri Piedra (04/2014)   Secondary male hypogonadism 04/2021   Type 2 diabetes mellitus with microalbuminuria (HCC) DM dx-09/27/2015. Microalb 2020    Past Surgical History:  Procedure Laterality Date   COLONOSCOPY W/ POLYPECTOMY  04/2010; 10/2011   Initial screening TCS showed 6 polyps (one  adenomatous).  Pt says he returned for repeat TCS 10/2011 showed one hyperplastic polyp: Recall 10 yrs per Dr. Bryon Lions SIGMOIDOSCOPY N/A 02/25/2022   Procedure: FLEXIBLE SIGMOIDOSCOPY;  Surgeon: Lemar Lofty., MD;  Location: [facility];  Service: Gastroenterology;  Laterality: N/A;   FLEXIBLE SIGMOIDOSCOPY N/A 03/04/2022   Procedure: FLEXIBLE SIGMOIDOSCOPY;  Surgeon: Shellia Cleverly, DO;  Location: [facility];  Service: Gastroenterology;  Laterality: N/A;   HEMOSTASIS CONTROL  02/25/2022   Procedure: HEMOSTASIS CONTROL;  Surgeon: Lemar Lofty., MD;  Location: [facility];  Service: Gastroenterology;;   HEMOSTASIS CONTROL  03/04/2022   Procedure: HEMOSTASIS CONTROL;  Surgeon: Shellia Cleverly, DO;  Location: [facility];  Service: Gastroenterology;;   INCISION AND DRAINAGE ABSCESS N/A 11/07/2021   Procedure: Incision and drainage of scrotal abscess;  Surgeon: Romie Levee, MD;  Location: [facility];  Service: General;  Laterality: N/A;   IR SINUS/FIST TUBE CHK-NON GI  03/06/2022   LAPAROTOMY N/A 02/07/2022   Resection of descending and sigmoid colon with transverse colostomy.Procedure: EXPLORATORY LAPAROTOMY, COLON RESECTION, OSTOMY;  Surgeon: Romie Levee, MD;  Location: [facility];  Service: General;  Laterality: N/A;   MOUTH SURGERY     TRANSTHORACIC ECHOCARDIOGRAM     03/05/22 NORMAL    Outpatient Medications Prior to Visit  Medication Sig Dispense Refill   Accu-Chek Softclix Lancets lancets  SMARTSIG:Topical 200 each 5   acetaminophen (TYLENOL) 325 MG tablet Take 2 tablets (650 mg total) by mouth every 4 (four) hours as needed for mild pain, moderate pain, fever or headache.     ascorbic acid (VITAMIN C) 500 MG tablet Take 1 tablet (500 mg total) by mouth 2 (two) times daily.     aspirin EC 81 MG tablet Take 81 mg by mouth daily. Swallow whole.     atorvastatin (LIPITOR) 40 MG tablet TAKE 1 TABLET BY MOUTH EVERY DAY 90 tablet 1   Continuous Blood Gluc  Receiver (FREESTYLE LIBRE 3 READER) DEVI 1 each by Does not apply route every 14 (fourteen) days. 2 each 2   Continuous Blood Gluc Sensor (FREESTYLE LIBRE 3 SENSOR) MISC Place 1 sensor on the skin every 14 days. Use to check glucose continuously 2 each 2   ferrous sulfate 325 (65 FE) MG EC tablet Take 325 mg by mouth 3 (three) times daily with meals.     furosemide (LASIX) 20 MG tablet Take 1 tablet (20 mg total) by mouth every other day. (Patient not taking: Reported on 07/29/2022) 60 tablet 3   glucose blood (ACCU-CHEK GUIDE) test strip 1 each by Other route 4 (four) times daily -  before meals and at bedtime. 300 strip 5   inFLIXimab (REMICADE IV) Inject into the vein.     insulin degludec (TRESIBA FLEXTOUCH) 100 UNIT/ML FlexTouch Pen Inject 24 Units into the skin at bedtime. 21 mL 1   Insulin Pen Needle ([place] PEN NEEDLES) 31G X 6 MM MISC 1 Application by Does not apply route at bedtime. 100 each 5   magnesium oxide (MAG-OX) 400 (240 Mg) MG tablet TAKE 2 TABLETS (800 MG TOTAL) BY MOUTH 3 (THREE) TIMES DAILY. 180 tablet 1   metFORMIN (GLUCOPHAGE) 1000 MG tablet Take 1 tablet (1,000 mg total) by mouth 2 (two) times daily with a meal. 180 tablet 3   Methotrexate, PF, 15 MG/0.3ML SOAJ Inject 1 Pen into the skin once a week. 2.4 mL 3   methylphenidate (METADATE CD) 20 MG CR capsule Take 1 capsule (20 mg total) by mouth daily as needed. 30 capsule 0   Multiple Vitamin (MULTIVITAMIN WITH MINERALS) TABS tablet Take 1 tablet by mouth daily.     thiamine (VITAMIN B-1) 100 MG tablet Take 1 tablet (100 mg total) by mouth daily.     zinc sulfate 220 (50 Zn) MG capsule Take 1 capsule (220 mg total) by mouth daily. 30 capsule 0   apixaban (ELIQUIS) 5 MG TABS tablet Take 1 tablet (5 mg total) by mouth 2 (two) times daily. (Patient not taking: Reported on 08/16/2022) 60 tablet 0   empagliflozin (JARDIANCE) 10 MG TABS tablet Take 1 tablet (10 mg total) by mouth daily before breakfast. 30 tablet 0   No  facility-administered medications prior to visit.    Allergies  Allergen Reactions   Lobster [Shellfish Allergy] Nausea Only   Poison Ivy Extract Itching    Review of Systems As per HPI  PE:    08/26/2022    1:32 PM 08/20/2022   11:09 AM 08/16/2022    4:15 PM  Vitals with BMI  Height 5\' 10"  5\' 10"  5\' 10"   Weight 204 lbs 3 oz 209 lbs 207 lbs  BMI 29.3 29.99 29.7  Systolic 101 122   Diastolic 68 84   Pulse 76 86      Physical Exam  Gen: Alert, well appearing.  Patient is oriented to person, place,  time, and situation. AFFECT: pleasant, lucid thought and speech. No further exam today  LABS:  Last CBC Lab Results  Component Value Date   WBC 12.1 (H) 06/11/2022   HGB 13.2 06/11/2022   HCT 40.0 06/11/2022   MCV 91.5 06/11/2022   MCH 30.4 05/24/2022   RDW 14.9 06/11/2022   PLT 363.0 06/11/2022   Last metabolic panel Lab Results  Component Value Date   GLUCOSE 185 (H) 06/11/2022   NA 138 06/11/2022   K 4.4 06/11/2022   CL 102 06/11/2022   CO2 25 06/11/2022   BUN 14 06/11/2022   CREATININE 0.52 06/11/2022   GFRNONAA >60 04/12/2022   CALCIUM 9.7 06/11/2022   PHOS 2.3 (L) 03/01/2022   PROT 7.4 06/11/2022   ALBUMIN 3.8 06/11/2022   BILITOT 0.4 06/11/2022   ALKPHOS 122 (H) 06/11/2022   AST 18 06/11/2022   ALT 17 06/11/2022   ANIONGAP 7 04/12/2022   Last lipids Lab Results  Component Value Date   CHOL 109 07/12/2021   HDL 54.60 07/12/2021   LDLCALC 34 07/12/2021   LDLDIRECT 139.9 06/13/2011   TRIG 144 02/21/2022   CHOLHDL 2 07/12/2021   Last hemoglobin A1c Lab Results  Component Value Date   HGBA1C 5.4 08/26/2022   HGBA1C 5.4 08/26/2022   HGBA1C 5.4 (A) 08/26/2022   HGBA1C 5.4 08/26/2022   Last thyroid functions Lab Results  Component Value Date   TSH 2.58 03/10/2019   IMPRESSION AND PLAN:  Diabetes with history of microalbuminuria. Excellent control---hemoglobin A1c today is 5.4%!  This correlates well with his home glucoses. He will  continue to wean Evaristo Bury down slowly.  Continue Jardiance 10 mg a day and metformin 1000 mg twice daily. Urine microalbumin/creatinine today. Basic metabolic panel today.  An After Visit Summary was printed and given to the patient.  FOLLOW UP: Return in about 3 months (around 11/26/2022) for routine chronic illness f/u.  Signed:  Santiago Bumpers, MD           08/26/2022

## 2022-08-27 LAB — MICROALBUMIN / CREATININE URINE RATIO
Creatinine,U: 49 mg/dL
Microalb Creat Ratio: 1.5 mg/g (ref 0.0–30.0)
Microalb, Ur: 0.7 mg/dL (ref 0.0–1.9)

## 2022-08-28 ENCOUNTER — Encounter: Payer: Self-pay | Admitting: Gastroenterology

## 2022-09-25 ENCOUNTER — Other Ambulatory Visit: Payer: Self-pay

## 2022-09-25 DIAGNOSIS — E1129 Type 2 diabetes mellitus with other diabetic kidney complication: Secondary | ICD-10-CM

## 2022-09-25 MED ORDER — FREESTYLE LIBRE 3 SENSOR MISC
2 refills | Status: AC
Start: 2022-09-25 — End: ?

## 2022-10-09 ENCOUNTER — Encounter: Payer: Self-pay | Admitting: Gastroenterology

## 2022-10-09 ENCOUNTER — Ambulatory Visit: Payer: Medicare Other

## 2022-10-09 VITALS — BP 103/62 | HR 59 | Temp 98.5°F | Resp 16 | Ht 70.0 in | Wt 201.4 lb

## 2022-10-09 DIAGNOSIS — K50118 Crohn's disease of large intestine with other complication: Secondary | ICD-10-CM

## 2022-10-09 DIAGNOSIS — K50119 Crohn's disease of large intestine with unspecified complications: Secondary | ICD-10-CM

## 2022-10-09 MED ORDER — ACETAMINOPHEN 325 MG PO TABS
650.0000 mg | ORAL_TABLET | Freq: Once | ORAL | Status: AC
  Administered 2022-10-09: 650 mg via ORAL
  Filled 2022-10-09: qty 2

## 2022-10-09 MED ORDER — SODIUM CHLORIDE 0.9 % IV SOLN
10.0000 mg/kg | Freq: Once | INTRAVENOUS | Status: AC
  Administered 2022-10-09: 900 mg via INTRAVENOUS
  Filled 2022-10-09: qty 90

## 2022-10-09 MED ORDER — METHYLPREDNISOLONE SODIUM SUCC 40 MG IJ SOLR
40.0000 mg | Freq: Once | INTRAMUSCULAR | Status: AC
  Administered 2022-10-09: 40 mg via INTRAVENOUS
  Filled 2022-10-09: qty 1

## 2022-10-09 MED ORDER — DIPHENHYDRAMINE HCL 25 MG PO CAPS
25.0000 mg | ORAL_CAPSULE | Freq: Once | ORAL | Status: AC
  Administered 2022-10-09: 25 mg via ORAL
  Filled 2022-10-09: qty 1

## 2022-10-09 NOTE — Progress Notes (Signed)
Diagnosis: Crohn's Disease  Provider:  Chilton Greathouse MD  Procedure: IV Infusion  IV Type: Peripheral, IV Location: L Forearm  Remicade (Infliximab), Dose: 900mg   Infusion Start Time: 1025  Infusion Stop Time: 1239  Post Infusion IV Care: Peripheral IV Discontinued  Discharge: Condition: Good, Destination: Home . AVS Provided  Performed by:  Adriana Mccallum, RN

## 2022-10-10 ENCOUNTER — Encounter: Payer: Medicare Other | Admitting: Family Medicine

## 2022-10-10 DIAGNOSIS — K439 Ventral hernia without obstruction or gangrene: Secondary | ICD-10-CM | POA: Insufficient documentation

## 2022-10-16 ENCOUNTER — Encounter: Payer: Self-pay | Admitting: Family Medicine

## 2022-10-16 ENCOUNTER — Ambulatory Visit (INDEPENDENT_AMBULATORY_CARE_PROVIDER_SITE_OTHER): Payer: Medicare Other | Admitting: Family Medicine

## 2022-10-16 VITALS — BP 131/84 | HR 71 | Ht 69.0 in | Wt 204.0 lb

## 2022-10-16 DIAGNOSIS — Z0000 Encounter for general adult medical examination without abnormal findings: Secondary | ICD-10-CM | POA: Diagnosis not present

## 2022-10-16 NOTE — Progress Notes (Signed)
WELCOME TO MEDICARE (IPPE) VISIT I explained that today's visit was for the purpose of health promotion and disease detection, as well as an introduction to Medicare and it's covered benefits.  I explained that no labs or other services would be performed today, but if any were determined to be necessary then appropriate orders/referrals would be arranged for these to be done at a future date.  Patient is a 67 y/o male who is already a patient established with me.  Pt's medical and social history were reviewed. Specifically, we reviewed PMH/PSH/Meds/FH.  Also reviewed alcohol, tobacco, and illicit drug use.  Diet and physical activity reviewed.   All of this info is also found in the appropriate sections of pt's EMR.  Pt was screened with appropriate screening instrument for depression.  Current or past experiences with mood disorders was discussed.       10/16/2022    1:39 PM  Depression screen PHQ 2/9  Decreased Interest 0  Down, Depressed, Hopeless 0  PHQ - 2 Score 0  Altered sleeping 0  Tired, decreased energy 0  Change in appetite 0  Feeling bad or failure about yourself  0  Trouble concentrating 0  Moving slowly or fidgety/restless 0  Suicidal thoughts 0  PHQ-9 Score 0  Difficult doing work/chores Not difficult at all       04/26/2022    1:24 PM 07/29/2022   11:25 AM 08/16/2022    4:11 PM 10/09/2022    2:32 PM 10/16/2022    1:38 PM  Fall Risk  Falls in the past year? 1 1 0 0 1  Was there an injury with Fall? 1 0   1  Fall Risk Category Calculator 3 1   2   Patient at Risk for Falls Due to Impaired vision;No Fall Risks History of fall(s)   History of fall(s)  Fall risk Follow up Falls evaluation completed Falls evaluation completed   Falls evaluation completed    Pt's functional ability and level of safety were reviewed. Specifically, I screened for hearing impairment and fall risk.  I assessed home safety and we discussed pt's competency with activities of daily living.   Pt fully independent in all activities of daily living.  EXAM: Vitals:   10/16/22 1329  BP: 131/84  Pulse: 71  SpO2: 97%   Body mass index is 30.13 kg/m. Visual acuity screen: No results found.  Patient gets routine eye care through optometrist and is up-to-date. No additional physical exam required or indicated today.  End of life planning: Advanced directives and power of attorney information specific to the patient were discussed.     Education, counseling, and referral for other preventive services: Written checklist was completed and given to pt for obtaining, as appropriate, the other preventive services that are covered as separate Medicare Part B benefits. Possible services that were reviewed with pt are:  -annual wellness visit (AWV) -Bone mass measurements: n/a currently -Cardiovascular screening blood tests: UTD -Colorectal cancer screening: n/a-->pt with crohn's and hx of sigmoidectomy and will be getting approp colon surveillance. -Counseling to prevent tobacco use -Diabetes screening tests: n/a-->pt with DM -Diabetes self-management training (DSMT): has had -Glaucoma screening: discussed, eye exam UTD -HIV screening: declined -Medical nutrition therapy: has had (DM) -Prostate cancer screening : due for PSA -Seasonal influenza, pneumococcal, and Hep B vaccines: all UTD -screening mammography: n/a -screening pap tests and pelvic exam: n/a -ultrasound screening for AAA->n/a.  He has had abd imaging in the last year showing no sign of  aneurism.  Of the above listed services, no referrals were made today.  Patient did not have an additional complaint/problem that was discussed and evaluated today.  Patient was given opportunity to ask any additional questions regarding Medicare and covered benefits.  Patient was informed that Medicare does not provide coverage for routine physical exams.  I answered all questions to the best of my ability today.    Follow up: Return  for keep appt set for September.  Signed:  Santiago Bumpers, MD           10/16/2022

## 2022-10-28 ENCOUNTER — Encounter: Payer: Self-pay | Admitting: *Deleted

## 2022-10-28 ENCOUNTER — Telehealth: Payer: Self-pay | Admitting: Gastroenterology

## 2022-10-28 NOTE — Telephone Encounter (Signed)
Information sent to patient's MyChart, in reference to the medication alternative to Methotrexate.

## 2022-10-28 NOTE — Telephone Encounter (Signed)
PT is looking for alternatives for methotrexate. Please advise

## 2022-10-29 ENCOUNTER — Telehealth: Payer: Self-pay | Admitting: *Deleted

## 2022-10-29 ENCOUNTER — Other Ambulatory Visit: Payer: Self-pay | Admitting: *Deleted

## 2022-10-29 MED ORDER — METHOTREXATE SODIUM 15 MG PO TABS: 15.0000 mg | ORAL_TABLET | ORAL | 3 refills | Status: DC

## 2022-10-29 NOTE — Telephone Encounter (Deleted)
Called patient to inform the change in the delivery for the Methtrexate (Rasuvo) medication. Patient prferred the po form of delivery instead of the continued pen injection due to cost. Dr.

## 2022-10-29 NOTE — Telephone Encounter (Signed)
error 

## 2022-10-29 NOTE — Telephone Encounter (Signed)
This has been addressed, see telephone note for 10/29/22.

## 2022-10-29 NOTE — Telephone Encounter (Signed)
Called patient to notify of change in delivery of medication due to cost. Methotrexate ( Rasuvo), has been changed from the pen injection to the po form as inquired via the patient. Dr. Barron Alvine ordered 15 mg weekly po, with a 3 month supply or refill. Patient has been notified and is pleased at this time. Medication will be ordered from [facility], patient is also aware.

## 2022-10-31 ENCOUNTER — Encounter (INDEPENDENT_AMBULATORY_CARE_PROVIDER_SITE_OTHER): Payer: Self-pay

## 2022-11-14 ENCOUNTER — Other Ambulatory Visit: Payer: Self-pay

## 2022-11-14 DIAGNOSIS — R809 Proteinuria, unspecified: Secondary | ICD-10-CM

## 2022-11-14 MED ORDER — "PEN NEEDLES 3/16"" 31G X 5 MM MISC": 5 refills | Status: DC

## 2022-11-26 ENCOUNTER — Telehealth: Payer: Self-pay | Admitting: Gastroenterology

## 2022-11-26 ENCOUNTER — Ambulatory Visit: Payer: Medicare Other | Admitting: Gastroenterology

## 2022-11-26 NOTE — Telephone Encounter (Signed)
Patient came in at 1:40 today thinking he had an appointment; however, his appointment was for 10:40 this morning.  He is requesting that a video visit be set up with Dr. Barron Alvine.  Please call patient and advise.  Thank you.

## 2022-11-26 NOTE — Telephone Encounter (Signed)
Patient has been rescheduled for 12/03/22 at 2:40 pm. He verbalizes understanding and thanked me for getting him an appointment.

## 2022-11-26 NOTE — Telephone Encounter (Signed)
Bonita Quin, I am no longer Dr. Frankey Shown nurse. Routing to [employer], Charity fundraiser.

## 2022-11-28 LAB — HEPATIC FUNCTION PANEL
ALT: 23 U/L (ref 10–40)
AST: 18 (ref 14–40)
Alkaline Phosphatase: 114 (ref 25–125)
Bilirubin, Total: 0.5

## 2022-11-28 LAB — BASIC METABOLIC PANEL
BUN: 17 (ref 4–21)
CO2: 28 — AB (ref 13–22)
Chloride: 104 (ref 99–108)
Creatinine: 0.6 (ref 0.6–1.3)
Glucose: 88
Potassium: 4.2 meq/L (ref 3.5–5.1)
Sodium: 140 (ref 137–147)

## 2022-11-28 LAB — CBC AND DIFFERENTIAL
HCT: 44 (ref 41–53)
Hemoglobin: 14.9 (ref 13.5–17.5)
Neutrophils Absolute: 8.3
WBC: 10.7

## 2022-11-28 LAB — HEMOGLOBIN A1C: Hemoglobin A1C: 14.9

## 2022-11-28 LAB — CBC: RBC: 4.55 (ref 3.87–5.11)

## 2022-11-28 LAB — COMPREHENSIVE METABOLIC PANEL
Albumin: 3.8 (ref 3.5–5.0)
Calcium: 9.5 (ref 8.7–10.7)
eGFR: 90

## 2022-11-28 LAB — PROTEIN / CREATININE RATIO, URINE: Albumin, U: 3.8

## 2022-11-28 LAB — HEPATITIS B SURFACE ANTIGEN: Hepatitis B Surface Ag: NONREACTIVE

## 2022-11-28 LAB — VITAMIN B12: Vitamin B-12: 614

## 2022-11-29 LAB — BASIC METABOLIC PANEL: Sodium: 140 (ref 137–147)

## 2022-12-02 NOTE — Patient Instructions (Incomplete)

## 2022-12-03 ENCOUNTER — Encounter: Payer: Self-pay | Admitting: Gastroenterology

## 2022-12-03 ENCOUNTER — Encounter: Payer: Self-pay | Admitting: Family Medicine

## 2022-12-03 ENCOUNTER — Ambulatory Visit (INDEPENDENT_AMBULATORY_CARE_PROVIDER_SITE_OTHER): Payer: Medicare Other | Admitting: Gastroenterology

## 2022-12-03 ENCOUNTER — Ambulatory Visit (INDEPENDENT_AMBULATORY_CARE_PROVIDER_SITE_OTHER): Payer: Medicare Other | Admitting: Family Medicine

## 2022-12-03 VITALS — BP 132/68 | HR 79 | Ht 69.0 in | Wt 202.0 lb

## 2022-12-03 VITALS — BP 118/69 | HR 93 | Temp 98.8°F | Ht 69.0 in | Wt 202.0 lb

## 2022-12-03 DIAGNOSIS — D509 Iron deficiency anemia, unspecified: Secondary | ICD-10-CM

## 2022-12-03 DIAGNOSIS — R809 Proteinuria, unspecified: Secondary | ICD-10-CM

## 2022-12-03 DIAGNOSIS — K50919 Crohn's disease, unspecified, with unspecified complications: Secondary | ICD-10-CM

## 2022-12-03 DIAGNOSIS — Z23 Encounter for immunization: Secondary | ICD-10-CM | POA: Diagnosis not present

## 2022-12-03 DIAGNOSIS — K9419 Other complications of enterostomy: Secondary | ICD-10-CM | POA: Diagnosis not present

## 2022-12-03 DIAGNOSIS — Z7984 Long term (current) use of oral hypoglycemic drugs: Secondary | ICD-10-CM | POA: Diagnosis not present

## 2022-12-03 DIAGNOSIS — Z79899 Other long term (current) drug therapy: Secondary | ICD-10-CM

## 2022-12-03 DIAGNOSIS — Z794 Long term (current) use of insulin: Secondary | ICD-10-CM

## 2022-12-03 DIAGNOSIS — F988 Other specified behavioral and emotional disorders with onset usually occurring in childhood and adolescence: Secondary | ICD-10-CM | POA: Diagnosis not present

## 2022-12-03 DIAGNOSIS — E1129 Type 2 diabetes mellitus with other diabetic kidney complication: Secondary | ICD-10-CM

## 2022-12-03 DIAGNOSIS — E611 Iron deficiency: Secondary | ICD-10-CM

## 2022-12-03 DIAGNOSIS — E119 Type 2 diabetes mellitus without complications: Secondary | ICD-10-CM

## 2022-12-03 LAB — POCT GLYCOSYLATED HEMOGLOBIN (HGB A1C)
HbA1c POC (<> result, manual entry): 5.4 % (ref 4.0–5.6)
HbA1c, POC (controlled diabetic range): 5.4 % (ref 0.0–7.0)
HbA1c, POC (prediabetic range): 5.4 % — AB (ref 5.7–6.4)
Hemoglobin A1C: 5.4 % (ref 4.0–5.6)

## 2022-12-03 MED ORDER — SILDENAFIL CITRATE 100 MG PO TABS: 100.0000 mg | ORAL_TABLET | Freq: Every day | ORAL | 6 refills | Status: AC | PRN

## 2022-12-03 MED ORDER — METHYLPHENIDATE HCL ER (CD) 20 MG PO CPCR: 20.0000 mg | ORAL_CAPSULE | Freq: Every day | ORAL | 0 refills | Status: DC | PRN

## 2022-12-03 NOTE — Patient Instructions (Addendum)
_______________________________________________________  If your blood pressure at your visit was 140/90 or greater, please contact your primary care physician to follow up on this.  _______________________________________________________  If you are age 67 or older, your body mass index should be between 23-30. Your Body mass index is 29.83 kg/m. If this is out of the aforementioned range listed, please consider follow up with your Primary Care Provider.  If you are age 108 or younger, your body mass index should be between 19-25. Your Body mass index is 29.83 kg/m. If this is out of the aformentioned range listed, please consider follow up with your Primary Care Provider.   ________________________________________________________  The [facility] providers would like to encourage you to use [facility] to communicate with providers for non-urgent requests or questions.  Due to long hold times on the telephone, sending your provider a message by [facility] may be a faster and more efficient way to get a response.  Please allow 48 business hours for a response.  Please remember that this is for non-urgent requests.  _______________________________________________________  Please follow up in 6 months. Give [place] a call at [phone] to schedule an appointment.  It was a pleasure to see you today!  Vito Cirigliano, D.O.

## 2022-12-03 NOTE — Progress Notes (Signed)
OFFICE VISIT  12/03/2022  CC:  Chief Complaint  Patient presents with   Medical Management of Chronic Issues    Pt is fasting    Patient is a 67 y.o. male who presents for follow-up diabetes and adult ADD. A/P as of last o/v 3 mo ago: "Diabetes with history of microalbuminuria. Excellent control---hemoglobin A1c today is 5.4%!  This correlates well with his home glucoses. He will continue to wean Evaristo Bury down slowly.  Continue Jardiance 10 mg a day and metformin 1000 mg twice daily. Urine microalbumin/creatinine today. Basic metabolic panel today."  INTERIM HX: Bernette Redbird states he feels well, the best he has felt in a long time. 2-week glucose average 110.  Lowest glucose 54.  Highest 200.  He is currently on 5 units of [place] daily.  Also taking Jardiance 10 mg a day and metformin 1000 mg twice a day.  He has a chronic feeling of decreased sensation on plantar aspect of both feet, mostly in the toes.  No neuropathic pain. No recent acute swelling in the legs.  He has a chronic pinkish discoloration to the right leg anteriorly with a mild amount of diffuse swelling that he has had since his DVT.  ADD: Pt states all is going well with the med at current dosing (metadate CD 20mg  qAM prn): much improved focus, concentration, task completion.  Less frustration, better multitasking, less impulsivity and restlessness.  Mood is stable. No side effects from the medication.  PMP AWARE reviewed today: most recent rx for methylphenidate CD was filled 08/06/2022, # 30, rx by me. No red flags.   Past Medical History:  Diagnosis Date   Adult ADHD    Allergic rhinitis    Bronchopneumonia 11/03/2013   Colon polyps 2012 and 2013   All hyperplastic except one adenomatous w/out high grade dysplasia (recall 2023 per Dr. Arlyce Dice)   COVID-19 virus infection 09/29/2020   Crohn disease (HCC)    Elevated transaminase level 2017   Viral hep screening neg.  Suspect fatty liver.   History of pneumonia     Hyperlipidemia    Hypertension    Large bowel perforation (HCC) 02/2022   delayed, s/p colonoscopy w/biopsies (new dx crohn's/inflammatory BD)   Obesity    OSA on CPAP    Severe.  CPAP 10 cm H2O.  Follows Dr. Wynona Neat.   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 Maisie Fus did surg   Rectal varices    +rectal bleed while in hosp 2023   Right leg DVT (HCC)    04/12/22 R fem and pop   Seborrheic dermatitis of scalp    and face: hydrocort 2% cream bid prn to face, and fluocinonide 0.5% sol'n to scalp bid prn per Dr. Terri Piedra (04/2014)   Secondary male hypogonadism 04/2021   Type 2 diabetes mellitus with microalbuminuria (HCC) DM dx-09/27/2015. Microalb 2020    Past Surgical History:  Procedure Laterality Date   COLONOSCOPY W/ POLYPECTOMY  04/2010; 10/2011   Initial screening TCS showed 6 polyps (one adenomatous).  Pt says he returned for repeat TCS 10/2011 showed one hyperplastic polyp: Recall 10 yrs per Dr. Bryon Lions SIGMOIDOSCOPY N/A 02/25/2022   Procedure: FLEXIBLE SIGMOIDOSCOPY;  Surgeon: Lemar Lofty., MD;  Location: [facility];  Service: Gastroenterology;  Laterality: N/A;   FLEXIBLE SIGMOIDOSCOPY N/A 03/04/2022   Procedure: FLEXIBLE SIGMOIDOSCOPY;  Surgeon: Shellia Cleverly, DO;  Location: [facility];  Service: Gastroenterology;  Laterality: N/A;   HEMOSTASIS CONTROL  02/25/2022   Procedure: HEMOSTASIS CONTROL;  Surgeon: Lemar Lofty., MD;  Location: [facility];  Service: Gastroenterology;;   HEMOSTASIS CONTROL  03/04/2022   Procedure: HEMOSTASIS CONTROL;  Surgeon: Shellia Cleverly, DO;  Location: [facility];  Service: Gastroenterology;;   INCISION AND DRAINAGE ABSCESS N/A 11/07/2021   Procedure: Incision and drainage of scrotal abscess;  Surgeon: Romie Levee, MD;  Location: [facility];  Service: General;  Laterality: N/A;   IR SINUS/FIST TUBE CHK-NON GI  03/06/2022   LAPAROTOMY N/A 02/07/2022   Resection of descending and sigmoid colon  with transverse colostomy.Procedure: EXPLORATORY LAPAROTOMY, COLON RESECTION, OSTOMY;  Surgeon: Romie Levee, MD;  Location: [facility];  Service: General;  Laterality: N/A;   MOUTH SURGERY     TRANSTHORACIC ECHOCARDIOGRAM     03/05/22 NORMAL    Outpatient Medications Prior to Visit  Medication Sig Dispense Refill   Accu-Chek Softclix Lancets lancets SMARTSIG:Topical 200 each 5   acetaminophen (TYLENOL) 325 MG tablet Take 2 tablets (650 mg total) by mouth every 4 (four) hours as needed for mild pain, moderate pain, fever or headache.     ascorbic acid (VITAMIN C) 500 MG tablet Take 1 tablet (500 mg total) by mouth 2 (two) times daily.     aspirin EC 81 MG tablet Take 81 mg by mouth daily. Swallow whole.     atorvastatin (LIPITOR) 40 MG tablet TAKE 1 TABLET BY MOUTH EVERY DAY 90 tablet 1   Continuous Blood Gluc Receiver (FREESTYLE LIBRE 3 READER) DEVI 1 each by Does not apply route every 14 (fourteen) days. 2 each 2   Continuous Glucose Sensor (FREESTYLE LIBRE 3 SENSOR) MISC Place 1 sensor on the skin every 14 days. Use to check glucose continuously 2 each 2   empagliflozin (JARDIANCE) 10 MG TABS tablet Take 1 tablet (10 mg total) by mouth daily before breakfast. 90 tablet 1   ferrous sulfate 325 (65 FE) MG EC tablet Take 325 mg by mouth 3 (three) times daily with meals.     glucose blood (ACCU-CHEK GUIDE) test strip 1 each by Other route 4 (four) times daily -  before meals and at bedtime. 300 strip 5   inFLIXimab (REMICADE IV) Inject into the vein.     Insulin Pen Needle (PEN NEEDLES 3/16") 31G X 5 MM MISC USE TO ADMINISTER TRESIBA 100 each 5   magnesium oxide (MAG-OX) 400 (240 Mg) MG tablet TAKE 2 TABLETS (800 MG TOTAL) BY MOUTH 3 (THREE) TIMES DAILY. 180 tablet 1   metFORMIN (GLUCOPHAGE) 1000 MG tablet Take 1 tablet (1,000 mg total) by mouth 2 (two) times daily with a meal. 180 tablet 3   methotrexate (RHEUMATREX) 15 MG tablet Take 1 tablet (15 mg total) by mouth once a week. Caution:  Chemotherapy. Protect from light. 4 tablet 3   Multiple Vitamin (MULTIVITAMIN WITH MINERALS) TABS tablet Take 1 tablet by mouth daily.     thiamine (VITAMIN B-1) 100 MG tablet Take 1 tablet (100 mg total) by mouth daily.     zinc sulfate 220 (50 Zn) MG capsule Take 1 capsule (220 mg total) by mouth daily. 30 capsule 0   insulin degludec (TRESIBA FLEXTOUCH) 100 UNIT/ML FlexTouch Pen Inject 24 Units into the skin at bedtime. 21 mL 1   furosemide (LASIX) 20 MG tablet Take 1 tablet (20 mg total) by mouth every other day. (Patient not taking: Reported on 07/29/2022) 60 tablet 3   methylphenidate (METADATE CD) 20 MG CR capsule Take 1 capsule (20 mg total) by mouth daily as needed. 30 capsule  0   No facility-administered medications prior to visit.    Allergies  Allergen Reactions   Lobster [Shellfish Allergy] Nausea Only   Poison Ivy Extract Itching    Review of Systems As per HPI  PE:    12/03/2022   10:35 AM 10/16/2022    1:29 PM 10/09/2022   12:40 PM  Vitals with BMI  Height 5\' 9"  5\' 9"    Weight 202 lbs 204 lbs   BMI 29.82 30.11   Systolic 118 131 295  Diastolic 69 84 62  Pulse 93 71 59     Physical Exam  Gen: Alert, well appearing.  Patient is oriented to person, place, time, and situation. AFFECT: pleasant, lucid thought and speech. Foot exam - no swelling, tenderness or skin or vascular lesions.  Color is pink.  Both feet cool to touch.  Sensation is diminished with monofilament testing, mainly on the plantar aspect of the toes of the right foot greater than left foot..  Dorsalis pedis and posterior tibial pulses are not palpable.  Toenails are normal.   LABS:  Last CBC Lab Results  Component Value Date   WBC 12.1 (H) 06/11/2022   HGB 13.2 06/11/2022   HCT 40.0 06/11/2022   MCV 91.5 06/11/2022   MCH 30.4 05/24/2022   RDW 14.9 06/11/2022   PLT 363.0 06/11/2022   Last metabolic panel Lab Results  Component Value Date   GLUCOSE 79 08/26/2022   NA 139 08/26/2022    K 3.9 08/26/2022   CL 103 08/26/2022   CO2 25 08/26/2022   BUN 11 08/26/2022   CREATININE 0.44 08/26/2022   GFR 110.56 08/26/2022   CALCIUM 10.1 08/26/2022   PHOS 2.3 (L) 03/01/2022   PROT 7.4 06/11/2022   ALBUMIN 3.8 06/11/2022   BILITOT 0.4 06/11/2022   ALKPHOS 122 (H) 06/11/2022   AST 18 06/11/2022   ALT 17 06/11/2022   ANIONGAP 7 04/12/2022   Last lipids Lab Results  Component Value Date   CHOL 109 07/12/2021   HDL 54.60 07/12/2021   LDLCALC 34 07/12/2021   LDLDIRECT 139.9 06/13/2011   TRIG 144 02/21/2022   CHOLHDL 2 07/12/2021   Last hemoglobin A1c Lab Results  Component Value Date   HGBA1C 5.4 12/03/2022   HGBA1C 5.4 12/03/2022   HGBA1C 5.4 (A) 12/03/2022   HGBA1C 5.4 12/03/2022   Last thyroid functions Lab Results  Component Value Date   TSH 2.58 03/10/2019   Last vitamin B12 and Folate Lab Results  Component Value Date   VITAMINB12 1,429 (H) 02/16/2022   FOLATE 7.0 12/13/2019   Lab Results  Component Value Date   IRON 26 (L) 03/28/2022   TIBC 197 (L) 03/28/2022   FERRITIN 114 03/28/2022   IMPRESSION AND PLAN:  #1 diabetes with microalbuminuria and DPN. Excellent control.  He has not had microalbuminuria since 2020. Point-of-care hemoglobin A1c today is 5.4%, same as last check 3 months ago. Will discontinue [place].  Continue Jardiance 10 mg a day and metformin 1000 mg twice daily. Feet exam consistent with DPN today. Lab results within the last week through [facility] GI were reviewed today, copy obtained for our record.  Electrolytes and renal function normal. Complete blood count was normal as well.  #2 adult ADD. Doing well long-term on Metadate CD 20 mg every morning as needed.  An After Visit Summary was printed and given to the patient.  FOLLOW UP: Return in about 3 months (around 03/04/2023) for routine chronic illness f/u.  Signed:  Santiago Bumpers, MD           12/03/2022

## 2022-12-03 NOTE — Progress Notes (Signed)
Chief Complaint:   Crohn's Disease   GI History: 67 year old male with advanced phenotypic, penetrating colonic Crohn's Disease. - 02/04/2022: Colonoscopy: severe colitis with deeply cratered ulcers throughout the colon and mucosal friability and normal TI (path: Chronic active colitis).  - 02/07/2022: Admitted with sigmoid perforation, requiring emergent surgery with descending/sigmoid colectomy, transverse colostomy. - 02/25/2022: Active lower GI bleed.  Sigmoidoscopy performed emergently and notable for deep cratered rectal ulcers with bleeding stigmata, treated with PuraSTAT gel - 02/26/2022: IR drainage large LLQ abscess, grew E coli, bacteroides.  Treated with Flagyl, Rocephin.  Drain interrogated (no fistula, collapsed/resolved abscess) and removed on 12/20.   -03/04/2022: Repeat flexible sigmoidoscopy for recurrent bleed.  Cratered ulcers, but overall improved from the previous sigmoidoscopy.  Again treated any stigmata with PuraSTAT gel with resolution -03/06/2022: Started Remicade along with short-chain fatty acid enemas x 2 weeks for possible overlapping diversion colitis. -03/07/2022: Changed IV Solu-Medrol to prednisone 40 mg/day.  Plan for continued Flagyl low-dose x 4 weeks.  Surgery recommended against repeat colonoscopy via ostomy that close to surgery. - 03/20/2022: Remicade dose #2 and started prednisone taper - 03/25/2022: Discharged from inpatient rehab to home - 04/12/2022: RLE DVT.  Started on Eliquis x 3 months - 05/24/2022: Follow-up in Colorectal Surgery. Opinion is that ostomy reversal would be difficult due to size, large ventral hernia, ostomy, IBD. - 05/31/2022: Fecal calprotectin 15 (from 3570 at diagnosis) - 06/11/2022: Infliximab antibody 67, drug trough <0.4.   - 06/24/2022: Follow-up in GI clinic. Increased Remicade to 10 mg/kg, added MTX 15 mg SQ and folic acid - 08/20/2022: Follow-up in GI clinic.  Symptoms improving on high-dose Remicade and after adding MTX.  Fatigue  improving.  Peristomal ulcer still present.  Minimal rectal output since adding MTX.  Converted MTX to 15 mg po weekly after 8 weeks of SQ therapy.  Referred to [facility] Multidisciplinary IBD clinic - 10/10/2022: Evaluated by Dr. Steele Sizer at [facility] IBD clinic.  Plan for repeat infliximab labs with plan to change to Stelara or Skyrizi pending results vs repeat colonoscopy if trough at goal with plan for follow-up in the multidisciplinary IBD clinic. - 11/28/2022: CRP 15.7.  Normal CBC, CMP, B12, HBsAg-, Richard [name]-   HPI:     Patient is a 67 y.o. male presenting to the Gastroenterology Clinic for follow-up.  Was last seen by me on 08/20/2022 with medication adjustment as above.  He has since been seen by Dr. Steele Sizer at [facility] Mult[facility]isciplinary IBD clinic on 10/10/2022 with follow-up scheduled there next month.  He had repeat labs last week notable for CRP 15.7, otherwise normal CBC, CMP, B12.  He reports infliximab trough/antibody was collected, but no results available for review.  Continues to have episodic fecal leakage PR that occurs when urinating.  Tends to do well after Remicade infusion, then symptoms start about 4 weeks into treatment course.  Has since converted methotrexate SQ from --> PO.  He has noticed over the last couple of weeks discomfort in the left nipple.  No drainage.  Separately, was recently seen by his ophthalmologist for retinal tear requiring surgery.  Was seen by his PCM earlier today.  Stopping insulin.   He has follow-up scheduled Dr. Steele Sizer and  [facility]orectal [facility] on 01/09/2023.    Review of systems:     No chest pain, no SOB, no fevers, no urinary sx   Past Medical History:  Diagnosis Date   Adult ADHD    Allergic rhinitis  Colon polyps 2012 and 2013   All hyperplastic except one adenomatous w/out high grade dysplasia (recall 2023 per Dr. Arlyce Dice)   COVID-19 virus infection 09/29/2020   Crohn disease (HCC)     Elevated transaminase level 2017   Viral hep screening neg.  Suspect fatty liver.   Erectile dysfunction    History of pneumonia    Hyperlipidemia    Hypertension    hx of   Large bowel perforation (HCC) 02/2022   delayed, s/p colonoscopy w/biopsies (new dx crohn's/inflammatory BD)   Obesity    OSA on CPAP    Severe.  CPAP 10 cm H2O.  Follows Dr. Wynona Neat.   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 Maisie Fus did surg   Rectal varices    +rectal bleed while in hosp 2023   Right leg DVT (HCC)    04/12/22 R fem and pop   Seborrheic dermatitis of scalp    and face: hydrocort 2% cream bid prn to face, and fluocinonide 0.5% sol'n to scalp bid prn per Dr. Terri Piedra (04/2014)   Secondary male hypogonadism 04/2021   Type 2 diabetes mellitus with microalbuminuria (HCC) DM dx-09/27/2015. Microalb 2020    Patient's surgical history, family medical history, social history, medications and allergies were all reviewed in Epic    Current Outpatient Medications  Medication Sig Dispense Refill   Accu-Chek Softclix Lancets lancets SMARTSIG:Topical 200 each 5   acetaminophen (TYLENOL) 325 MG tablet Take 2 tablets (650 mg total) by mouth every 4 (four) hours as needed for mild pain, moderate pain, fever or headache.     ascorbic acid (VITAMIN C) 500 MG tablet Take 1 tablet (500 mg total) by mouth 2 (two) times daily.     aspirin EC 81 MG tablet Take 81 mg by mouth daily. Swallow whole.     atorvastatin (LIPITOR) 40 MG tablet TAKE 1 TABLET BY MOUTH EVERY DAY 90 tablet 1   Continuous Blood Gluc Receiver (FREESTYLE LIBRE 3 READER) DEVI 1 each by Does not apply route every 14 (fourteen) days. 2 each 2   Continuous Glucose Sensor (FREESTYLE LIBRE 3 SENSOR) MISC Place 1 sensor on the skin every 14 days. Use to check glucose continuously 2 each 2   empagliflozin (JARDIANCE) 10 MG TABS tablet Take 1 tablet (10 mg total) by mouth daily before breakfast. 90 tablet 1   ferrous sulfate 325 (65 FE) MG EC  tablet Take 325 mg by mouth 3 (three) times daily with meals.     glucose blood (ACCU-CHEK GUIDE) test strip 1 each by Other route 4 (four) times daily -  before meals and at bedtime. 300 strip 5   inFLIXimab (REMICADE IV) Inject into the vein.     Insulin Pen Needle (PEN NEEDLES 3/16") 31G X 5 MM MISC USE TO ADMINISTER TRESIBA 100 each 5   magnesium oxide (MAG-OX) 400 (240 Mg) MG tablet TAKE 2 TABLETS (800 MG TOTAL) BY MOUTH 3 (THREE) TIMES DAILY. 180 tablet 1   metFORMIN (GLUCOPHAGE) 1000 MG tablet Take 1 tablet (1,000 mg total) by mouth 2 (two) times daily with a meal. 180 tablet 3   methotrexate (RHEUMATREX) 15 MG tablet Take 1 tablet (15 mg total) by mouth once a week. Caution: Chemotherapy. Protect from light. 4 tablet 3   methylphenidate (METADATE CD) 20 MG CR capsule Take 1 capsule (20 mg total) by mouth daily as needed. 30 capsule 0   Multiple Vitamin (MULTIVITAMIN WITH MINERALS) TABS tablet Take 1 tablet by mouth daily.  sildenafil (VIAGRA) 100 MG tablet Take 1 tablet (100 mg total) by mouth daily as needed for erectile dysfunction. 30 tablet 6   thiamine (VITAMIN B-1) 100 MG tablet Take 1 tablet (100 mg total) by mouth daily.     zinc sulfate 220 (50 Zn) MG capsule Take 1 capsule (220 mg total) by mouth daily. 30 capsule 0   No current facility-administered medications for this visit.    Physical Exam:     BP 132/68   Pulse 79   Ht 5\' 9"  (1.753 m)   Wt 202 lb (91.6 kg)   BMI 29.83 kg/m   GENERAL:  Pleasant male in NAD PSYCH: : Cooperative, normal affect ABDOMEN: Midline incision with bandages in place.  Ostomy in left hemiabdomen.  SKIN:  Rubbery, firm somewhat mobile tissue located directly beneath the areola on left with discomfort with palpation Musculoskeletal:  Normal muscle tone, normal strength NEURO: Alert and oriented x 3, no focal neurologic deficits   IMPRESSION and PLAN:    1) Crohn's disease 2) Ostomy in place 3) Fecal seepage 67 year old male with  severe, penetrating Crohn's Disease diagnosed in 01/2022, complicated by perforated sigmoid requiring emergent partial colectomy and transverse colostomy with prolonged hospital course.  Was partially responsive to Remicade, but infliximab trough undetectable with low antibody level (67), so methotrexate was added in 06/2022 and Remicade increased to 10 mg/kg.  Methotrexate has since transitioned from SQ --> PO.  Based on clinical description, I suspect the Remicade is not lasting through his infusion cycle, as he has breakthrough symptoms about 4 weeks into infusion cycles.  We discussed the pathophysiology of Crohn's Disease at length again today, to include medication options, with plan as follows:  - Continue Remicade for the time being (scheduled for infusion tomorrow) - Continue methotrexate 15 mg  po weekly - Will follow-up on infliximab trough/antibody level - He has now also established care with [facility] Multidisciplinary IBD clinic.  Explained to him today that I would allow them to make medication adjustments based on the pending infliximab trough/antibody level so to not confuse medical management of his complex disease.  Notes reviewed, and agree with plan for medication change to alternate biologic therapy depending on lab results, but I ultimately defer to their expertise on using Skyrizi, Stelara, Rinvoq, etc. - Additionally, he has appointment scheduled with Colorectal Surgery at [facility] next month - Will likely need colonoscopy in the near future.  As above, perfectly reasonable to have this done over at [facility] as part of their coordinated multidisciplinary care - Exam today with what appears to be gynecomastia.  Potentially 2/2 methotrexate.  Offered ultrasound, but he would like to hold off in favor of observation for the time being.  If worsening, ongoing, etc., may need imaging and stopping medication.  Related, he may have to undergo medication change for his  Crohn's in general as outlined above.  When making medication changes, will take this potential ADR into account  4) Iron deficiency anemia - Resolved - Repeat CBC and iron panel in 2 months or so   5) LE DVT - Completed 59-month course of Eliquis  RTC in 3-6 months as he will also be following with [facility] GI in the interim  I spent 45 minutes of time, including in depth chart review, independent review of results as outlined above, communicating results with the patient directly, face-to-face time with the patient, coordinating care, ordering studies and medications as appropriate, and documentation.  Shellia Cleverly ,DO, FACG 12/03/2022, 2:38 PM

## 2022-12-04 ENCOUNTER — Ambulatory Visit (INDEPENDENT_AMBULATORY_CARE_PROVIDER_SITE_OTHER): Payer: Medicare Other

## 2022-12-04 VITALS — BP 114/68 | HR 70 | Temp 98.4°F | Resp 20 | Ht 70.0 in | Wt 203.8 lb

## 2022-12-04 DIAGNOSIS — K50119 Crohn's disease of large intestine with unspecified complications: Secondary | ICD-10-CM

## 2022-12-04 DIAGNOSIS — K50118 Crohn's disease of large intestine with other complication: Secondary | ICD-10-CM

## 2022-12-04 MED ORDER — SODIUM CHLORIDE 0.9 % IV SOLN
10.0000 mg/kg | Freq: Once | INTRAVENOUS | Status: AC
  Administered 2022-12-04: 900 mg via INTRAVENOUS
  Filled 2022-12-04: qty 90

## 2022-12-04 MED ORDER — METHYLPREDNISOLONE SODIUM SUCC 40 MG IJ SOLR
40.0000 mg | Freq: Once | INTRAMUSCULAR | Status: AC
  Administered 2022-12-04: 40 mg via INTRAVENOUS
  Filled 2022-12-04: qty 1

## 2022-12-04 MED ORDER — ACETAMINOPHEN 325 MG PO TABS
650.0000 mg | ORAL_TABLET | Freq: Once | ORAL | Status: AC
  Administered 2022-12-04: 650 mg via ORAL
  Filled 2022-12-04: qty 2

## 2022-12-04 MED ORDER — DIPHENHYDRAMINE HCL 25 MG PO CAPS
25.0000 mg | ORAL_CAPSULE | Freq: Once | ORAL | Status: AC
  Administered 2022-12-04: 25 mg via ORAL
  Filled 2022-12-04: qty 1

## 2022-12-04 NOTE — Progress Notes (Signed)
Diagnosis: Crohn's Disease  Provider:  Chilton Greathouse MD  Procedure: IV Infusion  IV Type: Peripheral, IV Location: L Forearm  Remicade (Infliximab), Dose: 900mg   Infusion Start Time: 1019  Infusion Stop Time: 1228  Post Infusion IV Care: Peripheral IV Discontinued  Discharge: Condition: Good, Destination: Home . AVS Declined  Performed by:  Loney Hering, LPN

## 2022-12-11 ENCOUNTER — Telehealth: Payer: Self-pay

## 2022-12-11 MED ORDER — METHYLPHENIDATE HCL ER 20 MG PO TBCR: 20.0000 mg | EXTENDED_RELEASE_TABLET | Freq: Every day | ORAL | 0 refills | Status: DC

## 2022-12-11 NOTE — Addendum Note (Signed)
Addended by: Jeoffrey Massed on: 12/11/2022 05:01 PM   Modules accepted: Orders

## 2022-12-11 NOTE — Telephone Encounter (Signed)
Received fax from [facility] pharmacy stating that pt will need prior auth for Methylphenidate 20MG . Alternatives requested are 30/70 release 24HR Methylphenidate hydrochloride 20 mg extended release oral capsules. Please advise

## 2022-12-11 NOTE — Telephone Encounter (Signed)
Okay. Methylphenidate ER 20 mg sent. Please let Richard [name] know that I had to make this change in his prescription.  It should work the same so I think the change is fine. -thx

## 2022-12-12 ENCOUNTER — Encounter: Payer: Self-pay | Admitting: Gastroenterology

## 2022-12-16 ENCOUNTER — Telehealth: Payer: Self-pay

## 2022-12-16 ENCOUNTER — Other Ambulatory Visit (HOSPITAL_COMMUNITY): Payer: Self-pay

## 2022-12-16 ENCOUNTER — Encounter: Payer: Self-pay | Admitting: Gastroenterology

## 2022-12-16 NOTE — Telephone Encounter (Signed)
Pharmacy Patient Advocate Encounter   Received notification from Physician's Office that prior authorization for Methylphenidate is required/requested.   Insurance verification completed.   The patient is insured through [facility] MEDICARE .   Per test claim: PA required; PA submitted to [employer] medicare via CoverMyMeds Key/confirmation #/EOC Key: [id number] Status is pending

## 2022-12-17 NOTE — Telephone Encounter (Signed)
Sent as FYI. 

## 2022-12-17 NOTE — Telephone Encounter (Signed)
Outcome: Approved on September 30 by [facility] Medicare 2017  Approved. This drug has been approved under the Member's Medicare Part D benefit. Approved quantity: 30 units per 30 day(s).   You may fill up to a 90 day supply except for those on Specialty Tier 5, which can be filled up to a 30 day supply. Please call the pharmacy to process the prescription claim. Authorization Expiration Date: 03/17/2098

## 2022-12-17 NOTE — Telephone Encounter (Signed)
Is this an FYI? I cannot see that there is anything for me to do or not. Let me know.

## 2022-12-18 ENCOUNTER — Other Ambulatory Visit (HOSPITAL_COMMUNITY): Payer: Self-pay

## 2022-12-18 NOTE — Telephone Encounter (Signed)
Pharmacy Patient Advocate Encounter  Received notification from [facility] MEDICARE that Prior Authorization for Methylphenidate  has been APPROVED from 9.30.24 to 12.31.2099. Ran test claim, And the Rx has been last filled on 12/11/22 and is not yet payable again until on/after 01/02/23.   (The office has been notified in a separate encounter)  This test claim was processed through [facility]- copay amounts may vary at other pharmacies due to pharmacy/plan contracts, or as the patient moves through the different stages of their insurance plan.   PA #/Case ID/Reference #: (Key: [id number])

## 2023-01-09 ENCOUNTER — Telehealth: Payer: Self-pay

## 2023-01-09 NOTE — Telephone Encounter (Signed)
Patient called and stated he has changed providers and will be following a new treatment plan at a different infusion center. Upcoming appointment in November cancelled per patient's request.  Wyvonne Lenz, RN

## 2023-01-10 ENCOUNTER — Encounter: Payer: Self-pay | Admitting: Gastroenterology

## 2023-01-17 ENCOUNTER — Ambulatory Visit (HOSPITAL_COMMUNITY)
Admission: RE | Admit: 2023-01-17 | Discharge: 2023-01-17 | Disposition: A | Discharge status: Home / Self Care | Payer: Medicare Other | Source: Ambulatory Visit | Attending: *Deleted | Admitting: *Deleted

## 2023-01-17 NOTE — Progress Notes (Signed)
[facility]   Reason for visit:  LMQ colostomy with large parastomal hernia.  Decreasing wear time and quality of life.  Makes work life and work travel much more difficult.   HPI:  Inflammatory bowel disease with perforation and colostomy  PArastomal hernia Past Medical History:  Diagnosis Date  . Adult ADHD   . Allergic rhinitis   . Colon polyps 2012 and 2013   All hyperplastic except one adenomatous w/out high grade dysplasia (recall 2023 per Dr. Arlyce Dice)  . COVID-19 virus infection 09/29/2020  . Crohn disease (HCC)   . Elevated transaminase level 2017   Viral hep screening neg.  Suspect fatty liver.  . Erectile dysfunction   . History of pneumonia   . Hyperlipidemia   . Hypertension    hx of  . Large bowel perforation (HCC) 02/2022   delayed, s/p colonoscopy w/biopsies (new dx crohn's/inflammatory BD)  . Obesity   . OSA on CPAP    Severe.  CPAP 10 cm H2O.  Follows Dr. Wynona Neat.  [name] Kitchen Perineal abscess    2022/2023, recurrent-->fistula->Dr. Maisie Fus did surg  . Rectal varices    +rectal bleed while in hosp 2023  . Right leg DVT (HCC)    04/12/22 R fem and pop  . Seborrheic dermatitis of scalp    and face: hydrocort 2% cream bid prn to face, and fluocinonide 0.5% sol'n to scalp bid prn per Dr. Terri Piedra (04/2014)  . Secondary male hypogonadism 04/2021  . Type 2 diabetes mellitus with microalbuminuria (HCC) DM dx-09/27/2015. Microalb 2020   Family History  Problem Relation Age of Onset  . Lung cancer Father   . Cancer Father        nasal  . Allergies Father   . Colon cancer Neg Hx   . Esophageal cancer Neg Hx   . Rectal cancer Neg Hx   . Stomach cancer Neg Hx    Allergies  Allergen Reactions  . Lobster [Shellfish Allergy] Nausea Only  . Poison Ivy Extract Itching   Current Outpatient Medications  Medication Sig Dispense Refill Last Dose  . Accu-Chek Softclix Lancets lancets SMARTSIG:Topical 200 each 5   . acetaminophen (TYLENOL) 325 MG tablet Take 2 tablets  (650 mg total) by mouth every 4 (four) hours as needed for mild pain, moderate pain, fever or headache.     [name] Kitchen ascorbic acid (VITAMIN C) 500 MG tablet Take 1 tablet (500 mg total) by mouth 2 (two) times daily.     [name] Kitchen aspirin EC 81 MG tablet Take 81 mg by mouth daily. Swallow whole.     [name] Kitchen atorvastatin (LIPITOR) 40 MG tablet TAKE 1 TABLET BY MOUTH EVERY DAY 90 tablet 1   . Continuous Blood Gluc Receiver (FREESTYLE LIBRE 3 READER) DEVI 1 each by Does not apply route every 14 (fourteen) days. 2 each 2   . Continuous Glucose Sensor (FREESTYLE LIBRE 3 SENSOR) MISC Place 1 sensor on the skin every 14 days. Use to check glucose continuously 2 each 2   . empagliflozin (JARDIANCE) 10 MG TABS tablet Take 1 tablet (10 mg total) by mouth daily before breakfast. 90 tablet 1   . ferrous sulfate 325 (65 FE) MG EC tablet Take 325 mg by mouth 3 (three) times daily with meals.     [name] Kitchen glucose blood (ACCU-CHEK GUIDE) test strip 1 each by Other route 4 (four) times daily -  before meals and at bedtime. 300 strip 5   . inFLIXimab (REMICADE IV) Inject into the vein.     [name] Kitchen  Insulin Pen Needle (PEN NEEDLES 3/16") 31G X 5 MM MISC USE TO ADMINISTER TRESIBA 100 each 5   . magnesium oxide (MAG-OX) 400 (240 Mg) MG tablet TAKE 2 TABLETS (800 MG TOTAL) BY MOUTH 3 (THREE) TIMES DAILY. 180 tablet 1   . metFORMIN (GLUCOPHAGE) 1000 MG tablet Take 1 tablet (1,000 mg total) by mouth 2 (two) times daily with a meal. 180 tablet 3   . methotrexate (RHEUMATREX) 15 MG tablet Take 1 tablet (15 mg total) by mouth once a week. Caution: Chemotherapy. Protect from light. 4 tablet 3   . methylphenidate (METADATE ER) 20 MG ER tablet Take 1 tablet (20 mg total) by mouth daily. 30 tablet 0   . Multiple Vitamin (MULTIVITAMIN WITH MINERALS) TABS tablet Take 1 tablet by mouth daily.     . sildenafil (VIAGRA) 100 MG tablet Take 1 tablet (100 mg total) by mouth daily as needed for erectile dysfunction. 30 tablet 6   . thiamine (VITAMIN B-1) 100 MG tablet Take  1 tablet (100 mg total) by mouth daily.     [name] Kitchen zinc sulfate 220 (50 Zn) MG capsule Take 1 capsule (220 mg total) by mouth daily. 30 capsule 0    No current facility-administered medications for this encounter.   ROS  Review of Systems  Constitutional:  Positive for fatigue.  Gastrointestinal:        LMQ colostomy Parastomal hernia  Skin:  Positive for rash.       Medical adhesive related skin injury to peristomal skin  will initiate washing with VASHE  Psychiatric/Behavioral:         Stressed about leaks and working.   All other systems reviewed and are negative. Vital signs:  BP 116/70 (BP Location: Right Arm)   Pulse 66   Temp 97.9 F (36.6 C) (Oral)   Resp 18   SpO2 95%  Exam:  Physical Exam Vitals reviewed.  Constitutional:      Appearance: Normal appearance.  Abdominal:     Palpations: Abdomen is soft.     Hernia: A hernia is present.  Skin:    General: Skin is warm and dry.     Findings: Erythema and rash present.  Neurological:     General: No focal deficit present.     Mental Status: He is alert and oriented to person, place, and time.  Psychiatric:        Behavior: Behavior normal.    Stoma type/location:  LLQ colostomy Stomal assessment/size:  1 1/2" budded stoma with large parastomal hernia present, causing frequent loss of pouch seal.  Peristomal assessment:  erythema and denuded skin.   Will begin to wash with VASHE cleaner.  Apply powder and skin prep Treatment options for stomal/peristomal skin: barrier ring and 2 piece pouch.  Output: soft brown stool Ostomy pouching: 2pc. With barrier ring   Education provided:  increase in hernia size is complicating pouch fit and decreasing wear time. We are going to add an ostomy belt to secure pouch better with position changes.  Patient travels for work and needs more secure system.  Also recommend he get another BRava ostomy support (4XL) not in clinic stock. And try this again.  Previous one became soiled and he  felt once it was cut to fit, it did not support him.  He will try it without cutting (pouch beneath the belt) FOr travel and extra security.  PRovided 2 large ACE wraps to wrap around torso and support weight of large hernia.  Impression/dx  Parastomal hernia colostomy Discussion  See above.  Continue same pouching add ostomy belt and Brava support belt.  Plan  See back as needed. Belt added today and patient will order BRava support belt.     Visit time: 55 minutes.   Maple Hudson FNP-BC

## 2023-01-17 NOTE — Discharge Instructions (Signed)
Added ostomy belt  Item # 7299 Order 4XL Brava Ostomy support Belt [employer])  Wash irritated skin with VASHE Cleanser  air dry Consider 2 ace bandages for support to hernia

## 2023-01-21 ENCOUNTER — Telehealth: Payer: Self-pay | Admitting: Family Medicine

## 2023-01-21 MED ORDER — EMPAGLIFLOZIN 10 MG PO TABS: 10.0000 mg | ORAL_TABLET | Freq: Every day | ORAL | 0 refills | Status: DC

## 2023-01-21 MED ORDER — EMPAGLIFLOZIN 10 MG PO TABS: 10.0000 mg | ORAL_TABLET | Freq: Every day | ORAL | 3 refills | Status: DC

## 2023-01-21 NOTE — Telephone Encounter (Signed)
Okay, prescription sent 

## 2023-01-21 NOTE — Addendum Note (Signed)
Addended by: Jeoffrey Massed on: 01/21/2023 12:20 PM   Modules accepted: Orders

## 2023-01-21 NOTE — Telephone Encounter (Signed)
Patient would like to have a new prescription [facility]  home delivery for empagliflozin (JARDIANCE) 10 MG TABS tablet

## 2023-01-22 ENCOUNTER — Other Ambulatory Visit: Payer: Self-pay | Admitting: Family Medicine

## 2023-01-28 ENCOUNTER — Other Ambulatory Visit: Payer: Self-pay

## 2023-01-28 MED ORDER — AMOXICILLIN-POT CLAVULANATE 875-125 MG PO TABS: 1.0000 | ORAL_TABLET | Freq: Two times a day (BID) | ORAL | 0 refills | Status: DC

## 2023-01-28 NOTE — Telephone Encounter (Signed)
I have pended an antibiotic but need to know which pharmacy to send it to. Please complete the order after patient tells you the pharmacy.  See if you can get him on my schedule tomorrow at 11 and block 40 minutes.

## 2023-01-28 NOTE — Telephone Encounter (Signed)
Medication sent to [facility].

## 2023-01-28 NOTE — Telephone Encounter (Signed)
I spoke with Kinney's wife and unfortunately he can not come in tomorrow, however she said he is scheduled for Thursday. [facility]

## 2023-01-28 NOTE — Telephone Encounter (Signed)
Patient wife, Noreene Larsson [facility]) called about husband who is in [place] today, returning tomorrow. Wife states patient's abscess has returned, swelling and very sore she stated last time, patient ended up in hospital. Patient requesting antibiotic to start today until patient can get in for appt. I scheduled patient for appt for Thursday 11/14, told her that very unlikely Dr. Milinda Cave would prescribe antibiotic without seeing patient.  She asked if I would send a message to Dr. Milinda Cave.  Please advise.

## 2023-01-28 NOTE — Telephone Encounter (Signed)
Please advise 

## 2023-01-28 NOTE — Telephone Encounter (Signed)
LVM for patient and patient's wife regarding concern. MyChart message has also been sent to patient, no response as of yet.

## 2023-01-29 ENCOUNTER — Ambulatory Visit: Payer: Medicare Other | Admitting: Family Medicine

## 2023-01-29 VITALS — BP 98/61 | HR 85 | Wt 204.4 lb

## 2023-01-29 DIAGNOSIS — N492 Inflammatory disorders of scrotum: Secondary | ICD-10-CM

## 2023-01-29 DIAGNOSIS — N360 Urethral fistula: Secondary | ICD-10-CM | POA: Diagnosis not present

## 2023-01-29 LAB — BASIC METABOLIC PANEL
BUN: 16 mg/dL (ref 6–23)
CO2: 27 meq/L (ref 19–32)
Calcium: 9 mg/dL (ref 8.4–10.5)
Chloride: 105 meq/L (ref 96–112)
Creatinine, Ser: 0.85 mg/dL (ref 0.40–1.50)
GFR: 90.35 mL/min (ref >60.00)
Glucose, Bld: 95 mg/dL (ref 70–99)
Potassium: 4 meq/L (ref 3.5–5.1)
Sodium: 141 meq/L (ref 135–145)

## 2023-01-29 LAB — CBC WITH DIFFERENTIAL/PLATELET
Basophils Absolute: 0 10*3/uL (ref 0.0–0.1)
Basophils Relative: 0.4 % (ref 0.0–3.0)
Eosinophils Absolute: 0.4 10*3/uL (ref 0.0–0.7)
Eosinophils Relative: 3.8 % (ref 0.0–5.0)
HCT: 41.1 % (ref 39.0–52.0)
Hemoglobin: 13.9 g/dL (ref 13.0–17.0)
Lymphocytes Relative: 12.8 % (ref 12.0–46.0)
Lymphs Abs: 1.3 10*3/uL (ref 0.7–4.0)
MCHC: 33.8 g/dL (ref 30.0–36.0)
MCV: 98.6 fL (ref 78.0–100.0)
Monocytes Absolute: 0.9 10*3/uL (ref 0.1–1.0)
Monocytes Relative: 8.5 % (ref 3.0–12.0)
Neutro Abs: 7.6 10*3/uL (ref 1.4–7.7)
Neutrophils Relative %: 74.5 % (ref 43.0–77.0)
Platelets: 227 10*3/uL (ref 150.0–400.0)
RBC: 4.17 Mil/uL — ABNORMAL LOW (ref 4.22–5.81)
RDW: 13.8 % (ref 11.5–15.5)
WBC: 10.2 10*3/uL (ref 4.0–10.5)

## 2023-01-29 MED ORDER — DOXYCYCLINE HYCLATE 100 MG PO CAPS: 100.0000 mg | ORAL_CAPSULE | Freq: Two times a day (BID) | ORAL | 0 refills | Status: AC

## 2023-01-29 NOTE — Progress Notes (Signed)
OFFICE VISIT  01/29/2023  CC:  Chief Complaint  Patient presents with   Skin Concern    Pt has had a recurring abscess over the past 2 years; pt had surgery to remove issue and now it has come back. Pt inquiring about next steps; if it needs to be drained or new meds need to be prescribed.    Patient is a 67 y.o. male who presents for concern of abscess.  HPI: 2 d/a he noted pain and swelling on back of scrotum, drainage from the area started this morning. Says same exact area where he had abscess before. No f/c/malaise. No nausea or abd pain.  Crohn's dz:  Was on remicaid-->not getting to therapeutic levels so switched over to skyrizi about 3 wks ago was 1st infusion.  Off MTX since then. No blood or pus from ostomy.    ROS as above, plus--> no fevers, no CP, no SOB, no wheezing, no cough, no dizziness, no HAs, no rashes, no melena/hematochezia.  No polyuria or polydipsia.  No myalgias or arthralgias.  No focal weakness, paresthesias, or tremors.  No acute vision or hearing abnormalities.  No dysuria or unusual/new urinary urgency or frequency.  No recent changes in lower legs. No n/v/d or abd pain.  No palpitations.    Past Medical History:  Diagnosis Date   Adult ADHD    Allergic rhinitis    Colon polyps 2012 and 2013   All hyperplastic except one adenomatous w/out high grade dysplasia (recall 2023 per Dr. Arlyce Dice)   COVID-19 virus infection 09/29/2020   Crohn disease (HCC)    Elevated transaminase level 2017   Viral hep screening neg.  Suspect fatty liver.   Erectile dysfunction    History of pneumonia    Hyperlipidemia    Hypertension    hx of   Large bowel perforation (HCC) 02/2022   delayed, s/p colonoscopy w/biopsies (new dx crohn's/inflammatory BD)   Obesity    OSA on CPAP    Severe.  CPAP 10 cm H2O.  Follows Dr. Wynona Neat.   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 Maisie Fus did surg   Rectal varices    +rectal bleed while in hosp 2023   Right leg  DVT (HCC)    04/12/22 R fem and pop   Seborrheic dermatitis of scalp    and face: hydrocort 2% cream bid prn to face, and fluocinonide 0.5% sol'n to scalp bid prn per Dr. Terri Piedra (04/2014)   Secondary male hypogonadism 04/2021   Type 2 diabetes mellitus with microalbuminuria (HCC) DM dx-09/27/2015. Microalb 2020    Past Surgical History:  Procedure Laterality Date   COLONOSCOPY W/ POLYPECTOMY  04/2010; 10/2011   Initial screening TCS showed 6 polyps (one adenomatous).  Pt says he returned for repeat TCS 10/2011 showed one hyperplastic polyp: Recall 10 yrs per Dr. Arlyce Dice   EYE SURGERY     Laser surg L eye for retinal tear 11/16/22   FLEXIBLE SIGMOIDOSCOPY N/A 02/25/2022   Procedure: FLEXIBLE SIGMOIDOSCOPY;  Surgeon: Meridee Score Netty Starring., MD;  Location: [facility];  Service: Gastroenterology;  Laterality: N/A;   FLEXIBLE SIGMOIDOSCOPY N/A 03/04/2022   Procedure: FLEXIBLE SIGMOIDOSCOPY;  Surgeon: Shellia Cleverly, DO;  Location: [facility];  Service: Gastroenterology;  Laterality: N/A;   HEMOSTASIS CONTROL  02/25/2022   Procedure: HEMOSTASIS CONTROL;  Surgeon: Meridee Score Netty Starring., MD;  Location: [facility];  Service: Gastroenterology;;   HEMOSTASIS CONTROL  03/04/2022   Procedure: HEMOSTASIS CONTROL;  Surgeon: Shellia Cleverly, DO;  Location:  [facility];  Service: Gastroenterology;;   INCISION AND DRAINAGE ABSCESS N/A 11/07/2021   Procedure: Incision and drainage of scrotal abscess;  Surgeon: Romie Levee, MD;  Location: [facility];  Service: General;  Laterality: N/A;   IR SINUS/FIST TUBE CHK-NON GI  03/06/2022   LAPAROTOMY N/A 02/07/2022   Resection of descending and sigmoid colon with transverse colostomy.Procedure: EXPLORATORY LAPAROTOMY, COLON RESECTION, OSTOMY;  Surgeon: Romie Levee, MD;  Location: [facility];  Service: General;  Laterality: N/A;   MOUTH SURGERY     TRANSTHORACIC ECHOCARDIOGRAM     03/05/22 NORMAL    Outpatient Medications Prior to Visit  Medication Sig  Dispense Refill   Accu-Chek Softclix Lancets lancets SMARTSIG:Topical 200 each 5   acetaminophen (TYLENOL) 325 MG tablet Take 2 tablets (650 mg total) by mouth every 4 (four) hours as needed for mild pain, moderate pain, fever or headache.     amoxicillin-clavulanate (AUGMENTIN) 875-125 MG tablet Take 1 tablet by mouth 2 (two) times daily. 20 tablet 0   ascorbic acid (VITAMIN C) 500 MG tablet Take 1 tablet (500 mg total) by mouth 2 (two) times daily.     aspirin EC 81 MG tablet Take 81 mg by mouth daily. Swallow whole.     atorvastatin (LIPITOR) 40 MG tablet TAKE 1 TABLET BY MOUTH EVERY DAY 90 tablet 0   Continuous Blood Gluc Receiver (FREESTYLE LIBRE 3 READER) DEVI 1 each by Does not apply route every 14 (fourteen) days. 2 each 2   Continuous Glucose Sensor (FREESTYLE LIBRE 3 SENSOR) MISC Place 1 sensor on the skin every 14 days. Use to check glucose continuously 2 each 2   empagliflozin (JARDIANCE) 10 MG TABS tablet Take 1 tablet (10 mg total) by mouth daily before breakfast. 90 tablet 3   ferrous sulfate 325 (65 FE) MG EC tablet Take 325 mg by mouth 3 (three) times daily with meals.     glucose blood (ACCU-CHEK GUIDE) test strip 1 each by Other route 4 (four) times daily -  before meals and at bedtime. 300 strip 5   inFLIXimab (REMICADE IV) Inject into the vein.     Insulin Pen Needle (PEN NEEDLES 3/16") 31G X 5 MM MISC USE TO ADMINISTER TRESIBA 100 each 5   magnesium oxide (MAG-OX) 400 (240 Mg) MG tablet TAKE 2 TABLETS (800 MG TOTAL) BY MOUTH 3 (THREE) TIMES DAILY. 180 tablet 1   metFORMIN (GLUCOPHAGE) 1000 MG tablet Take 1 tablet (1,000 mg total) by mouth 2 (two) times daily with a meal. 180 tablet 3   methotrexate (RHEUMATREX) 15 MG tablet Take 1 tablet (15 mg total) by mouth once a week. Caution: Chemotherapy. Protect from light. 4 tablet 3   methylphenidate (METADATE ER) 20 MG ER tablet Take 1 tablet (20 mg total) by mouth daily. 30 tablet 0   Multiple Vitamin (MULTIVITAMIN WITH MINERALS)  TABS tablet Take 1 tablet by mouth daily.     sildenafil (VIAGRA) 100 MG tablet Take 1 tablet (100 mg total) by mouth daily as needed for erectile dysfunction. 30 tablet 6   thiamine (VITAMIN B-1) 100 MG tablet Take 1 tablet (100 mg total) by mouth daily.     zinc sulfate 220 (50 Zn) MG capsule Take 1 capsule (220 mg total) by mouth daily. 30 capsule 0   No facility-administered medications prior to visit.    Allergies  Allergen Reactions   Lobster [Shellfish Allergy] Nausea Only   Poison Ivy Extract Itching    Review of Systems  As per  HPI  PE:    01/29/2023   11:31 AM 01/17/2023   11:03 AM 12/04/2022   12:37 PM  Vitals with BMI  Weight 204 lbs 6 oz    Systolic 98 116 114  Diastolic 61 70 68  Pulse 85 66 70     Physical Exam  Gen: Alert, well appearing.  Patient is oriented to person, place, time, and situation. AFFECT: pleasant, lucid thought and speech. Abd: soft, NT Scrotum: posterior aspect with golf ball size subQ firm swelling, not so much fluctuant, +fistula tract at intersection of perineum and scrotum draining serosanguinous fluid. I can probe about 1 cm deep with culture swab.  LABS:  Last CBC Lab Results  Component Value Date   WBC 10.7 11/28/2022   HGB 14.9 11/28/2022   HCT 44 11/28/2022   MCV 91.5 06/11/2022   MCH 30.4 05/24/2022   RDW 14.9 06/11/2022   PLT 363.0 06/11/2022   Last metabolic panel Lab Results  Component Value Date   GLUCOSE 79 08/26/2022   NA 140 11/29/2022   K 4.2 11/28/2022   CL 104 11/28/2022   CO2 28 (A) 11/28/2022   BUN 17 11/28/2022   CREATININE 0.6 11/28/2022   EGFR 90 11/28/2022   CALCIUM 9.5 11/28/2022   PHOS 2.3 (L) 03/01/2022   PROT 7.4 06/11/2022   ALBUMIN 3.8 11/28/2022   BILITOT 0.4 06/11/2022   ALKPHOS 114 11/28/2022   AST 18 11/28/2022   ALT 23 11/28/2022   ANIONGAP 7 04/12/2022   Last hemoglobin A1c Lab Results  Component Value Date   HGBA1C 5.4 12/03/2022   HGBA1C 5.4 12/03/2022   HGBA1C 5.4  (A) 12/03/2022   HGBA1C 5.4 12/03/2022   IMPRESSION AND PLAN:  Recurrent fistula, scrotal abscess. Draining some today. No systemic symptoms. Augmentin x 2 doses taken so far, will continue at least 10d. Added doxy 100 bid x 10d today. Sent wound culture today.  CBC and basic metabolic panel today. We are in the process of arranging gen surg appt ([facility], Dr. Maisie Fus).  An After Visit Summary was printed and given to the patient.  FOLLOW UP: Return in about 5 days (around 02/03/2023) for Follow-up abscess.  Signed:  Santiago Bumpers, MD           01/29/2023

## 2023-01-30 ENCOUNTER — Encounter: Payer: Self-pay | Admitting: Family Medicine

## 2023-01-30 ENCOUNTER — Ambulatory Visit: Payer: Medicare Other | Admitting: Family Medicine

## 2023-01-30 NOTE — Telephone Encounter (Signed)
Glad to hear this! Tuesday morning sounds good.  11 or 11:20.

## 2023-02-01 LAB — WOUND CULTURE
MICRO NUMBER:: 15725915
SPECIMEN QUALITY:: ADEQUATE

## 2023-02-03 ENCOUNTER — Ambulatory Visit: Payer: Medicare Other | Admitting: Family Medicine

## 2023-02-03 ENCOUNTER — Ambulatory Visit: Payer: Self-pay | Admitting: General Surgery

## 2023-02-03 NOTE — H&P (Signed)
PROVIDER:  Elenora Gamma, MD  MRN: [id number] DOB: [birth year] DATE OF ENCOUNTER: 02/03/2023 Interval History:   Richard [name] is a 67 y.o. male with history of DM2, severe OSA, HTN, new diagnosis of IBD who was originally admitted to [facility] on 02/07/2022 with perforated colon with peritonitis requiring emergent exploratory lap with resection of descending and sigmoid colon with transverse colostomy.  He had complicated and prolonged hospital course with encephalopathy due to PNA, rectal bleeding due to rectal varices as well as multiple rectal ulcers requiring hemostatic spray, LLQ abscess positive for E. coli and Bacteroides and placed on Zosyn and Flagyl per ID input.  He was transferred to CIR on 12/15 and was discharged ~1 mo later.  He was diagnosed with a DVT and underwent treatment for this. He is now on Remicade but still having some issues.  They are having some pouching difficulties due to his hernia.   Past Medical History:  Diagnosis Date   Adult ADHD    Allergic rhinitis    Colon polyps 2012 and 2013   All hyperplastic except one adenomatous w/out high grade dysplasia (recall 2023 per Dr. Arlyce Dice)   COVID-19 virus infection 09/29/2020   Crohn disease (HCC)    Elevated transaminase level 2017   Viral hep screening neg.  Suspect fatty liver.   Erectile dysfunction    History of pneumonia    History of retinal tear    11/2022   Hyperlipidemia    Hypertension    hx of   Large bowel perforation (HCC) 02/2022   delayed, s/p colonoscopy w/biopsies (new dx crohn's/inflammatory BD)   Obesity    OSA on CPAP    Severe.  CPAP 10 cm H2O.  Follows Dr. Wynona Neat.   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 Maisie Fus did surg   Rectal varices    +rectal bleed while in hosp 2023   Right leg DVT (HCC)    04/12/22 R fem and pop   Seborrheic dermatitis of scalp    and face: hydrocort 2% cream bid prn to face, and fluocinonide 0.5% sol'n to scalp bid prn per Dr. Terri Piedra  (04/2014)   Secondary male hypogonadism 04/2021   Type 2 diabetes mellitus with microalbuminuria (HCC) DM dx-09/27/2015. Microalb 2020   Past Surgical History:  Procedure Laterality Date   COLONOSCOPY W/ POLYPECTOMY  04/2010; 10/2011   Initial screening TCS showed 6 polyps (one adenomatous).  Pt says he returned for repeat TCS 10/2011 showed one hyperplastic polyp: Recall 10 yrs per Dr. Arlyce Dice   EYE SURGERY     Laser surg L eye for retinal tear 11/16/22   FLEXIBLE SIGMOIDOSCOPY N/A 02/25/2022   Procedure: FLEXIBLE SIGMOIDOSCOPY;  Surgeon: Meridee Score Netty Starring., MD;  Location: [facility];  Service: Gastroenterology;  Laterality: N/A;   FLEXIBLE SIGMOIDOSCOPY N/A 03/04/2022   Procedure: FLEXIBLE SIGMOIDOSCOPY;  Surgeon: Shellia Cleverly, DO;  Location: [facility];  Service: Gastroenterology;  Laterality: N/A;   HEMOSTASIS CONTROL  02/25/2022   Procedure: HEMOSTASIS CONTROL;  Surgeon: Lemar Lofty., MD;  Location: [facility];  Service: Gastroenterology;;   HEMOSTASIS CONTROL  03/04/2022   Procedure: HEMOSTASIS CONTROL;  Surgeon: Shellia Cleverly, DO;  Location: [facility];  Service: Gastroenterology;;   INCISION AND DRAINAGE ABSCESS N/A 11/07/2021   Procedure: Incision and drainage of scrotal abscess;  Surgeon: Romie Levee, MD;  Location: [facility];  Service: General;  Laterality: N/A;   IR SINUS/FIST TUBE CHK-NON GI  03/06/2022   LAPAROTOMY N/A 02/07/2022   Resection  of descending and sigmoid colon with transverse colostomy.Procedure: EXPLORATORY LAPAROTOMY, COLON RESECTION, OSTOMY;  Surgeon: Romie Levee, MD;  Location: [facility];  Service: General;  Laterality: N/A;   MOUTH SURGERY     TRANSTHORACIC ECHOCARDIOGRAM     03/05/22 NORMAL   Family History  Problem Relation Age of Onset   Lung cancer Father    Cancer Father        nasal   Allergies Father    Colon cancer Neg Hx    Esophageal cancer Neg Hx    Rectal cancer Neg Hx    Stomach cancer Neg Hx    Social  History   Socioeconomic History   Marital status: Married    Spouse name: Not on file   Number of children: Not on file   Years of education: Not on file   Highest education level: Bachelor's degree (e.g., BA, AB, BS)  Occupational History   Occupation: Special educational needs teacher  Tobacco Use   Smoking status: Former    Current packs/day: 0.00    Types: Cigarettes    Quit date: 07/17/2002    Years since quitting: 20.5   Smokeless tobacco: Never  Vaping Use   Vaping status: Never Used  Substance and Sexual Activity   Alcohol use: Yes    Alcohol/week: 7.0 standard drinks of alcohol    Types: 7 Standard drinks or equivalent per week    Comment: wine every night with dinner   Drug use: No   Sexual activity: Yes    Birth control/protection: None  Other Topics Concern   Not on file  Social History Narrative   Married, 1 child--grown.   Occupation: Child psychotherapist"   Tobacco: 20 pack-yr hx; quit 2004.   Alcohol: nightly cocktail   Drugs: none   Exercise: "walk some when I can".   Diet: normal.   Right Handed. Used to be left handed   Three story home   Drinks a lot of tea and occasional coffee   Social Determinants of Health   Financial Resource Strain: Low Risk  (01/29/2023)   Overall Financial Resource Strain (CARDIA)    Difficulty of Paying Living Expenses: Not hard at all  Food Insecurity: Low Risk  (01/30/2023)   Received from [facility]   Hunger Vital Sign    Worried About Running Out of Food in the Last Year: Never true    Ran Out of Food in the Last Year: Never true  Transportation Needs: No Transportation Needs (01/30/2023)   Received from [facility]    In the past 12 months, has lack of reliable transportation kept you from medical appointments, meetings, work or from getting things needed for daily living? : No  Physical Activity: Unknown (01/29/2023)   Exercise Vital Sign    Days of Exercise per Week: 0 days    Minutes of Exercise per  Session: Not on file  Stress: Stress Concern Present (01/29/2023)   [employer] of Occupational Health - Occupational Stress Questionnaire    Feeling of Stress : To some extent  Social Connections: Moderately Isolated (01/29/2023)   Social Connection and Isolation Panel [NHANES]    Frequency of Communication with Friends and Family: Three times a week    Frequency of Social Gatherings with Friends and Family: Once a week    Attends Religious Services: Never    Database administrator or Organizations: No    Attends Engineer, structural: Not on file    Marital Status: Married  Intimate Partner Violence: Not At Risk (02/08/2022)   Humiliation, Afraid, Rape, and Kick questionnaire    Fear of Current or Ex-Partner: No    Emotionally Abused: No    Physically Abused: No    Sexually Abused: No    Current Outpatient Medications:    Accu-Chek Softclix Lancets lancets, SMARTSIG:Topical, Disp: 200 each, Rfl: 5   acetaminophen (TYLENOL) 325 MG tablet, Take 2 tablets (650 mg total) by mouth every 4 (four) hours as needed for mild pain, moderate pain, fever or headache., Disp: , Rfl:    amoxicillin-clavulanate (AUGMENTIN) 875-125 MG tablet, Take 1 tablet by mouth 2 (two) times daily., Disp: 20 tablet, Rfl: 0   ascorbic acid (VITAMIN C) 500 MG tablet, Take 1 tablet (500 mg total) by mouth 2 (two) times daily., Disp: , Rfl:    aspirin EC 81 MG tablet, Take 81 mg by mouth daily. Swallow whole., Disp: , Rfl:    atorvastatin (LIPITOR) 40 MG tablet, TAKE 1 TABLET BY MOUTH EVERY DAY, Disp: 90 tablet, Rfl: 0   Continuous Blood Gluc Receiver (FREESTYLE LIBRE 3 READER) DEVI, 1 each by Does not apply route every 14 (fourteen) days., Disp: 2 each, Rfl: 2   Continuous Glucose Sensor (FREESTYLE LIBRE 3 SENSOR) MISC, Place 1 sensor on the skin every 14 days. Use to check glucose continuously, Disp: 2 each, Rfl: 2   doxycycline (VIBRAMYCIN) 100 MG capsule, Take 1 capsule (100 mg total) by mouth 2 (two)  times daily for 10 days., Disp: 20 capsule, Rfl: 0   empagliflozin (JARDIANCE) 10 MG TABS tablet, Take 1 tablet (10 mg total) by mouth daily before breakfast., Disp: 90 tablet, Rfl: 3   ferrous sulfate 325 (65 FE) MG EC tablet, Take 325 mg by mouth 3 (three) times daily with meals., Disp: , Rfl:    glucose blood (ACCU-CHEK GUIDE) test strip, 1 each by Other route 4 (four) times daily -  before meals and at bedtime., Disp: 300 strip, Rfl: 5   inFLIXimab (REMICADE IV), Inject into the vein., Disp: , Rfl:    Insulin Pen Needle (PEN NEEDLES 3/16") 31G X 5 MM MISC, USE TO ADMINISTER TRESIBA, Disp: 100 each, Rfl: 5   magnesium oxide (MAG-OX) 400 (240 Mg) MG tablet, TAKE 2 TABLETS (800 MG TOTAL) BY MOUTH 3 (THREE) TIMES DAILY., Disp: 180 tablet, Rfl: 1   metFORMIN (GLUCOPHAGE) 1000 MG tablet, Take 1 tablet (1,000 mg total) by mouth 2 (two) times daily with a meal., Disp: 180 tablet, Rfl: 3   methotrexate (RHEUMATREX) 15 MG tablet, Take 1 tablet (15 mg total) by mouth once a week. Caution: Chemotherapy. Protect from light., Disp: 4 tablet, Rfl: 3   methylphenidate (METADATE ER) 20 MG ER tablet, Take 1 tablet (20 mg total) by mouth daily., Disp: 30 tablet, Rfl: 0   Multiple Vitamin (MULTIVITAMIN WITH MINERALS) TABS tablet, Take 1 tablet by mouth daily., Disp: , Rfl:    sildenafil (VIAGRA) 100 MG tablet, Take 1 tablet (100 mg total) by mouth daily as needed for erectile dysfunction., Disp: 30 tablet, Rfl: 6   thiamine (VITAMIN B-1) 100 MG tablet, Take 1 tablet (100 mg total) by mouth daily., Disp: , Rfl:    zinc sulfate 220 (50 Zn) MG capsule, Take 1 capsule (220 mg total) by mouth daily., Disp: 30 capsule, Rfl: 0 Allergies  Allergen Reactions   Lobster [Shellfish Allergy] Nausea Only   Poison Ivy Extract Itching   Review of Systems - Negative except as stated above   Assessment  and Plan:   Physical Examination:  Gen: NAD Abd: soft, ostomy well healed, large ventral hernia  Rectal: Small anterior  midline opening with area of fluctuance in the left anterior perirectal area.  No drainage noted currently.  A/P  Richard [name] is a 67 y.o. male with newly diagnosed Crohn's disease, who underwent exploratory laparotomy and left colectomy with colostomy in November 2023.  He has had a prolonged recovery.  He just switched from Remicade to [employer].  He developed an abscess in his perirectal region.  This was treated starting Augmentin and doxycycline.  He appears to have an anterior fistula.  I have recommended seton placement until he can get his Crohn's under better control.  Hopefully the Cristy Folks will work better for him.     Diagnoses and all orders for this visit:  Anal fistula, complex, recurrent     The plan was discussed in detail with the patient today, who expressed understanding.  The patient has my contact information, and understands to call me with any additional questions or concerns in the interval.  I would be happy to see the patient back sooner if the need arises.   Vanita Panda, MD Colon and Rectal Surgery [facility]

## 2023-02-04 ENCOUNTER — Other Ambulatory Visit: Payer: Self-pay | Admitting: Family Medicine

## 2023-02-04 ENCOUNTER — Encounter: Payer: Self-pay | Admitting: Family Medicine

## 2023-02-04 MED ORDER — AMOXICILLIN-POT CLAVULANATE 875-125 MG PO TABS: 1.0000 | ORAL_TABLET | Freq: Two times a day (BID) | ORAL | 0 refills | Status: DC

## 2023-02-05 ENCOUNTER — Ambulatory Visit: Payer: Medicare Other | Admitting: Family Medicine

## 2023-02-05 ENCOUNTER — Ambulatory Visit: Payer: Medicare Other

## 2023-03-04 ENCOUNTER — Encounter: Payer: Self-pay | Admitting: Family Medicine

## 2023-03-04 ENCOUNTER — Ambulatory Visit: Payer: Medicare Other | Admitting: Family Medicine

## 2023-03-04 VITALS — BP 130/79 | HR 77 | Wt 201.8 lb

## 2023-03-04 DIAGNOSIS — N360 Urethral fistula: Secondary | ICD-10-CM

## 2023-03-04 DIAGNOSIS — E1142 Type 2 diabetes mellitus with diabetic polyneuropathy: Secondary | ICD-10-CM

## 2023-03-04 DIAGNOSIS — L02215 Cutaneous abscess of perineum: Secondary | ICD-10-CM

## 2023-03-04 DIAGNOSIS — F988 Other specified behavioral and emotional disorders with onset usually occurring in childhood and adolescence: Secondary | ICD-10-CM | POA: Diagnosis not present

## 2023-03-04 DIAGNOSIS — Z7984 Long term (current) use of oral hypoglycemic drugs: Secondary | ICD-10-CM

## 2023-03-04 LAB — POCT GLYCOSYLATED HEMOGLOBIN (HGB A1C)
HbA1c POC (<> result, manual entry): 5.6 % (ref 4.0–5.6)
HbA1c, POC (controlled diabetic range): 5.6 % (ref 0.0–7.0)
HbA1c, POC (prediabetic range): 5.6 % — AB (ref 5.7–6.4)
Hemoglobin A1C: 5.6 % (ref 4.0–5.6)

## 2023-03-04 NOTE — Progress Notes (Signed)
OFFICE VISIT  03/04/2023  CC:  Chief Complaint  Patient presents with   Diabetes    Pt is fasting.     Patient is a 67 y.o. male who presents for 37-month follow-up diabetes and adult ADD. I last saw him 01/29/2023. A/P as of that visit: "Recurrent fistula, scrotal abscess. Draining some today. No systemic symptoms. Augmentin x 2 doses taken so far, will continue at least 10d. Added doxy 100 bid x 10d today. Sent wound culture today.  CBC and basic metabolic panel today. We are in the process of arranging gen surg appt ([facility], Dr. Maisie Fus)."  INTERIM HX: Richard [name] is doing great. His fistula no longer drains. There is plan for placement of seton 05/01/22 for treatment of his fistula. He has had 2 Skyrizi infusions and feels like they are helping. There is plan for colonoscopy on 04/11/2023 to monitor for mucosal healing.  Current diabetes medication regimen: Metformin 1/2 1000 mg twice daily and Jardiance 10 mg a days. Fasting and postprandial glucose is well within normal limits.  Pt states all is going well with the med at current dosing (methylphenidate CR 20 mg daily: much improved focus, concentration, task completion.  Less frustration, better multitasking, less impulsivity and restlessness.  Mood is stable. No side effects from the medication.  PMP AWARE reviewed today: most recent rx for methylphenidate ER 20 mg was filled 12/11/2022, # 30, rx by me. No red flags.  ROS as above, plus--> no fevers, no CP, no SOB, no wheezing, no cough, no dizziness, no HAs, no rashes, no melena/hematochezia.  No polyuria or polydipsia.  No myalgias or arthralgias.  No focal weakness, paresthesias, or tremors.  No acute vision or hearing abnormalities.  No dysuria or unusual/new urinary urgency or frequency.  No recent changes in lower legs. No n/v/, no abdominal pain. no palpitations.     Past Medical History:  Diagnosis Date   Adult ADHD    Allergic rhinitis    Colon polyps  2012 and 2013   All hyperplastic except one adenomatous w/out high grade dysplasia (recall 2023 per Dr. Arlyce Dice)   COVID-19 virus infection 09/29/2020   Crohn disease (HCC)    Elevated transaminase level 2017   Viral hep screening neg.  Suspect fatty liver.   Erectile dysfunction    History of pneumonia    History of retinal tear    11/2022   Hyperlipidemia    Hypertension    hx of   Large bowel perforation (HCC) 02/2022   delayed, s/p colonoscopy w/biopsies (new dx crohn's/inflammatory BD)   Obesity    OSA on CPAP    Severe.  CPAP 10 cm H2O.  Follows Dr. Wynona Neat.   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 Maisie Fus did surg   Rectal varices    +rectal bleed while in hosp 2023   Right leg DVT (HCC)    04/12/22 R fem and pop   Seborrheic dermatitis of scalp    and face: hydrocort 2% cream bid prn to face, and fluocinonide 0.5% sol'n to scalp bid prn per Dr. Terri Piedra (04/2014)   Secondary male hypogonadism 04/2021   Type 2 diabetes mellitus with microalbuminuria (HCC) DM dx-09/27/2015. Microalb 2020    Past Surgical History:  Procedure Laterality Date   COLONOSCOPY W/ POLYPECTOMY  04/2010; 10/2011   Initial screening TCS showed 6 polyps (one adenomatous).  Pt says he returned for repeat TCS 10/2011 showed one hyperplastic polyp: Recall 10 yrs per Dr. Arlyce Dice   EYE  SURGERY     Laser surg L eye for retinal tear 11/16/22   FLEXIBLE SIGMOIDOSCOPY N/A 02/25/2022   Procedure: FLEXIBLE SIGMOIDOSCOPY;  Surgeon: Meridee Score Netty Starring., MD;  Location: [facility];  Service: Gastroenterology;  Laterality: N/A;   FLEXIBLE SIGMOIDOSCOPY N/A 03/04/2022   Procedure: FLEXIBLE SIGMOIDOSCOPY;  Surgeon: Shellia Cleverly, DO;  Location: [facility];  Service: Gastroenterology;  Laterality: N/A;   HEMOSTASIS CONTROL  02/25/2022   Procedure: HEMOSTASIS CONTROL;  Surgeon: Lemar Lofty., MD;  Location: [facility];  Service: Gastroenterology;;   HEMOSTASIS CONTROL  03/04/2022    Procedure: HEMOSTASIS CONTROL;  Surgeon: Shellia Cleverly, DO;  Location: [facility];  Service: Gastroenterology;;   INCISION AND DRAINAGE ABSCESS N/A 11/07/2021   Procedure: Incision and drainage of scrotal abscess;  Surgeon: Romie Levee, MD;  Location: [facility];  Service: General;  Laterality: N/A;   IR SINUS/FIST TUBE CHK-NON GI  03/06/2022   LAPAROTOMY N/A 02/07/2022   Resection of descending and sigmoid colon with transverse colostomy.Procedure: EXPLORATORY LAPAROTOMY, COLON RESECTION, OSTOMY;  Surgeon: Romie Levee, MD;  Location: [facility];  Service: General;  Laterality: N/A;   MOUTH SURGERY     TRANSTHORACIC ECHOCARDIOGRAM     03/05/22 NORMAL    Outpatient Medications Prior to Visit  Medication Sig Dispense Refill   Accu-Chek Softclix Lancets lancets SMARTSIG:Topical 200 each 5   acetaminophen (TYLENOL) 325 MG tablet Take 2 tablets (650 mg total) by mouth every 4 (four) hours as needed for mild pain, moderate pain, fever or headache.     amoxicillin-clavulanate (AUGMENTIN) 875-125 MG tablet Take 1 tablet by mouth 2 (two) times daily. 20 tablet 0   ascorbic acid (VITAMIN C) 500 MG tablet Take 1 tablet (500 mg total) by mouth 2 (two) times daily.     aspirin EC 81 MG tablet Take 81 mg by mouth daily. Swallow whole.     atorvastatin (LIPITOR) 40 MG tablet TAKE 1 TABLET BY MOUTH EVERY DAY 90 tablet 0   Continuous Blood Gluc Receiver (FREESTYLE LIBRE 3 READER) DEVI 1 each by Does not apply route every 14 (fourteen) days. 2 each 2   Continuous Glucose Sensor (FREESTYLE LIBRE 3 SENSOR) MISC Place 1 sensor on the skin every 14 days. Use to check glucose continuously 2 each 2   empagliflozin (JARDIANCE) 10 MG TABS tablet Take 1 tablet (10 mg total) by mouth daily before breakfast. 90 tablet 3   ferrous sulfate 325 (65 FE) MG EC tablet Take 325 mg by mouth 3 (three) times daily with meals.     glucose blood (ACCU-CHEK GUIDE) test strip 1 each by Other route 4 (four) times daily -  before  meals and at bedtime. 300 strip 5   magnesium oxide (MAG-OX) 400 (240 Mg) MG tablet TAKE 2 TABLETS (800 MG TOTAL) BY MOUTH 3 (THREE) TIMES DAILY. 180 tablet 1   metFORMIN (GLUCOPHAGE) 1000 MG tablet Take 1 tablet (1,000 mg total) by mouth 2 (two) times daily with a meal. 180 tablet 3   methotrexate (RHEUMATREX) 15 MG tablet Take 1 tablet (15 mg total) by mouth once a week. Caution: Chemotherapy. Protect from light. 4 tablet 3   methylphenidate (METADATE ER) 20 MG ER tablet Take 1 tablet (20 mg total) by mouth daily. 30 tablet 0   Multiple Vitamin (MULTIVITAMIN WITH MINERALS) TABS tablet Take 1 tablet by mouth daily.     sildenafil (VIAGRA) 100 MG tablet Take 1 tablet (100 mg total) by mouth daily as needed for erectile dysfunction. 30  tablet 6   thiamine (VITAMIN B-1) 100 MG tablet Take 1 tablet (100 mg total) by mouth daily.     zinc sulfate 220 (50 Zn) MG capsule Take 1 capsule (220 mg total) by mouth daily. 30 capsule 0   inFLIXimab (REMICADE IV) Inject into the vein.     Insulin Pen Needle (PEN NEEDLES 3/16") 31G X 5 MM MISC USE TO ADMINISTER TRESIBA 100 each 5   No facility-administered medications prior to visit.    Allergies  Allergen Reactions   Lobster [Shellfish Allergy] Nausea Only   Poison Ivy Extract Itching    Review of Systems As per HPI  PE:    03/04/2023   10:30 AM 01/29/2023   11:31 AM 01/17/2023   11:03 AM  Vitals with BMI  Weight 201 lbs 13 oz 204 lbs 6 oz   Systolic 130 98 116  Diastolic 79 61 70  Pulse 77 85 66     Physical Exam  Gen: Alert, well appearing.  Patient is oriented to person, place, time, and situation. AFFECT: pleasant, lucid thought and speech. No further exam today  LABS:  Last CBC Lab Results  Component Value Date   WBC 10.2 01/29/2023   HGB 13.9 01/29/2023   HCT 41.1 01/29/2023   MCV 98.6 01/29/2023   MCH 30.4 05/24/2022   RDW 13.8 01/29/2023   PLT 227.0 01/29/2023   Last metabolic panel Lab Results  Component Value Date    GLUCOSE 95 01/29/2023   NA 141 01/29/2023   K 4.0 01/29/2023   CL 105 01/29/2023   CO2 27 01/29/2023   BUN 16 01/29/2023   CREATININE 0.85 01/29/2023   GFR 90.35 01/29/2023   CALCIUM 9.0 01/29/2023   PHOS 2.3 (L) 03/01/2022   PROT 7.4 06/11/2022   ALBUMIN 3.8 11/28/2022   BILITOT 0.4 06/11/2022   ALKPHOS 114 11/28/2022   AST 18 11/28/2022   ALT 23 11/28/2022   ANIONGAP 7 04/12/2022   Lab Results  Component Value Date   CHOL 109 07/12/2021   HDL 54.60 07/12/2021   LDLCALC 34 07/12/2021   LDLDIRECT 139.9 06/13/2011   TRIG 144 02/21/2022   CHOLHDL 2 07/12/2021    Last hemoglobin A1c Lab Results  Component Value Date   HGBA1C 5.6 03/04/2023   HGBA1C 5.6 03/04/2023   HGBA1C 5.6 (A) 03/04/2023   HGBA1C 5.6 03/04/2023   Lab Results  Component Value Date   PSA 0.36 07/12/2021   PSA 0.25 07/11/2020   PSA 0.2 03/10/2019     IMPRESSION AND PLAN:  #1 diabetes with peripheral neuropathy and history of microalbuminuria.  Excellent control. Point-of-care hemoglobin A1c today is 5.6%. At this point he will continue one half of a 1000 mg metformin tab twice a day as well as Jardiance 10 mg a day.  He will soon start to take himself off the metformin. Check urine microalbumin/creatinine at next follow-up visit in 4 months.  #2 adult ADD. Doing well on Metadate ER 20 mg a day  #3 recurrent perineal abscess with fistula. No current drainage or pain or swelling. He has Augmentin to start in case anything recurs. He has plans for fistula surgery/SETON in February next year.  4.  Crohn's disease. His colostomy has been complicated by a large hernia but he seems to be hanging in there.   Doing well on [employer]. GI plans colonoscopy 04/11/2023. Will take methotrexate and remicaid off his med list.  An After Visit Summary was printed and given to the patient.  FOLLOW UP: Return in about 4 months (around 07/03/2023) for annual CPE (fasting).  Signed:  Santiago Bumpers, MD            03/04/2023

## 2023-03-06 ENCOUNTER — Telehealth: Payer: Self-pay

## 2023-03-06 NOTE — Telephone Encounter (Signed)
CRM: Junious Dresser, with [facility] in [place], [place], called stating that patient recently had an office visit with Dr. Milinda Cave on 03/04/23. Junious Dresser states pt has recently gone medicare & they are needing the office visit notes from 12/17. She stated that Dr. Milinda Cave did send a letter stating that he is a patient of his but she's needing the office visit notes. Please fax that information to [phone].   Called and spoke with pt to confirm; pt stated it was okay for office notes to be sent over.

## 2023-03-19 HISTORY — PX: COLONOSCOPY: SHX174

## 2023-03-25 ENCOUNTER — Telehealth: Payer: Self-pay

## 2023-03-25 NOTE — Telephone Encounter (Signed)
 Pt is needing assistance with CPAP authorization. This is not something taken care of by PCP. Please send to appropriate staff to assist pt.

## 2023-03-25 NOTE — Telephone Encounter (Signed)
 Copied from CRM [id number]. Topic: Clinical - Request for Lab/Test Order >> Mar 25, 2023 12:09 PM Tiffany H wrote: Reason for CRM: Patient called to advise that Medicare is requesting the last office visit notes for CPAP machine authorization. CPAP machine was ordered by Dr. Neda in Pulmonary. Please see 03/27/21 Office visit notes for reference. Dr. MALVA indicated that patient would benefit from CPAP on a nightly basis. Please fax Office Visit notes from that visit to [facility] with acknowledgments from Dr. Helon. Patient said a note has already been written. Please assist.   [facility]  Phone: [phone] Fax: [phone] Attn: Jori  Direct Line: [phone]

## 2023-03-27 ENCOUNTER — Telehealth: Payer: Self-pay

## 2023-03-27 NOTE — Telephone Encounter (Signed)
 Auth Submission: NO AUTH NEEDED Site of care: Site of care: [facility] Payer: MEDICARE A/B & AARP Medication & CPT/J Code(s) submitted: Remicade  (Infliximab ) J1745 Route of submission (phone, fax, portal):  Phone # Fax # Auth type: Buy/Bill Units/visits requested: 900mg  x 7 doses Reference number:  Approval from: 08/28/22 to 04/17/24

## 2023-04-14 ENCOUNTER — Encounter: Payer: Self-pay | Admitting: Gastroenterology

## 2023-04-16 ENCOUNTER — Other Ambulatory Visit: Payer: Self-pay | Admitting: Family Medicine

## 2023-04-18 ENCOUNTER — Other Ambulatory Visit: Payer: Self-pay | Admitting: Family Medicine

## 2023-04-22 NOTE — Telephone Encounter (Signed)
  Communication  Reason for CRM: Patient would like help in regard to his empagliflozin  (JARDIANCE ) 10 MG TABS tablet medication, states [facility] sent him an email that said his prescriber won't fill this medication. Patient would like a call back.                LVM to discuss.

## 2023-04-23 ENCOUNTER — Other Ambulatory Visit: Payer: Self-pay | Admitting: Family Medicine

## 2023-04-25 ENCOUNTER — Other Ambulatory Visit: Payer: Self-pay

## 2023-04-25 ENCOUNTER — Telehealth: Payer: Self-pay

## 2023-04-25 MED ORDER — EMPAGLIFLOZIN 10 MG PO TABS: 10.0000 mg | ORAL_TABLET | Freq: Every day | ORAL | 3 refills | Status: DC

## 2023-04-25 NOTE — Telephone Encounter (Signed)
 Question need for CPAP assistance/need for follow-up  Patient has not been seen since 2023  At the visit did not need anything from us 

## 2023-04-25 NOTE — Telephone Encounter (Signed)
 Copied from CRM [id number]. Topic: General - Call Back - No Documentation >> Apr 24, 2023  4:48 PM Viola F wrote: Reason for CRM: Patient returning Chloe phone call regaridng the Jardiance , running out of medication, has less then a week left - please call patient asap

## 2023-04-25 NOTE — Telephone Encounter (Signed)
 Patient called and advised a new prescription has been sent to his mail order pharmacy.

## 2023-04-25 NOTE — Telephone Encounter (Signed)
 Spoke to patient.  He stated that he will call back to schedule after looking at his schedule.  Nothing further needed.

## 2023-04-29 ENCOUNTER — Encounter (HOSPITAL_BASED_OUTPATIENT_CLINIC_OR_DEPARTMENT_OTHER): Payer: Self-pay | Admitting: General Surgery

## 2023-04-29 ENCOUNTER — Other Ambulatory Visit: Payer: Self-pay

## 2023-04-29 NOTE — Progress Notes (Signed)
Spoke w/ via phone for pre-op interview---[facility] needs dos---- ISTAT, EKG Lab results------03/05/22 echo, EF 60 - 65%        COVID test -----patient states asymptomatic no test needed Arrive at -------0600 on Friday, 05/02/2023 NPO after MN NO Solid Food.  Clear liquids from MN until---0500 Med rec completed Medications to take morning of surgery -----Lipitor Diabetic medication -----Hold Metformin and Jardiance on the morning of surgery. Patient instructed no nail polish to be worn day of surgery Patient instructed to bring photo id and insurance card day of surgery Patient aware to have Driver (ride ) / caregiver    for 24 hours after surgery - wife, Noreene Larsson Patient Special Instructions -----Patient was asked to bring  CPAP and colostomy supplies. Patient stated that he would not need them. I encouraged him to bring supplies just in case they were needed. Pre-Op special Instructions -----Patient has a colostomy. Patient verbalized understanding of instructions that were given at this phone interview. Patient denies chest pain, sob, fever, cough at the interview.

## 2023-05-01 NOTE — Anesthesia Preprocedure Evaluation (Signed)
Anesthesia Evaluation  Patient identified by MRN, date of birth, ID band Patient awake    Reviewed: Allergy & Precautions, NPO status , Patient's Chart, lab work & pertinent test results  History of Anesthesia Complications Negative for: history of anesthetic complications  Airway Mallampati: II  TM Distance: >3 FB Neck ROM: Full    Dental  (+) Dental Advisory Given   Pulmonary sleep apnea and Continuous Positive Airway Pressure Ventilation , former smoker   breath sounds clear to auscultation       Cardiovascular hypertension, Pt. on medications + Peripheral Vascular Disease and + DVT   Rhythm:Regular Rate:Normal  '23 ECHO: EF 60 to 65%. 1. The LV has normal function, no regional wall motion abnormalities.    2. RVF is normal. The right ventricular size is normal.   3. The mitral valve is grossly normal. Trivial mitral valve regurgitation. No evidence of mitral stenosis.   4. The aortic valve is tricuspid. Aortic valve regurgitation is not visualized. No aortic stenosis is present.     Neuro/Psych  PSYCHIATRIC DISORDERS (ADHD) Anxiety Depression    negative neurological ROS     GI/Hepatic Neg liver ROS,,,Crohn's   Endo/Other  diabetes (glu 102), Oral Hypoglycemic Agents    Renal/GU negative Renal ROS     Musculoskeletal  (+) Arthritis ,    Abdominal   Peds  Hematology negative hematology ROS (+)   Anesthesia Other Findings   Reproductive/Obstetrics                             Anesthesia Physical Anesthesia Plan  ASA: 3  Anesthesia Plan: MAC   Post-op Pain Management: Tylenol PO (pre-op)*   Induction: Intravenous  PONV Risk Score and Plan: 2 and Ondansetron and Dexamethasone  Airway Management Planned: Natural Airway and Simple Face Mask  Additional Equipment: None  Intra-op Plan:   Post-operative Plan:   Informed Consent: I have reviewed the patients History and  Physical, chart, labs and discussed the procedure including the risks, benefits and alternatives for the proposed anesthesia with the patient or authorized representative who has indicated his/her understanding and acceptance.     Dental advisory given  Plan Discussed with: CRNA and Surgeon  Anesthesia Plan Comments:        Anesthesia Quick Evaluation

## 2023-05-02 ENCOUNTER — Encounter (HOSPITAL_BASED_OUTPATIENT_CLINIC_OR_DEPARTMENT_OTHER)
Admission: RE | Disposition: A | Discharge status: Home / Self Care | Payer: Self-pay | Source: Ambulatory Visit | Attending: General Surgery

## 2023-05-02 ENCOUNTER — Ambulatory Visit (HOSPITAL_BASED_OUTPATIENT_CLINIC_OR_DEPARTMENT_OTHER)
Admission: RE | Admit: 2023-05-02 | Discharge: 2023-05-02 | Disposition: A | Discharge status: Home / Self Care | Payer: Medicare Other | Source: Ambulatory Visit | Attending: General Surgery | Admitting: General Surgery

## 2023-05-02 ENCOUNTER — Ambulatory Visit (HOSPITAL_BASED_OUTPATIENT_CLINIC_OR_DEPARTMENT_OTHER): Payer: Self-pay | Admitting: Certified Registered Nurse Anesthetist

## 2023-05-02 ENCOUNTER — Other Ambulatory Visit: Payer: Self-pay

## 2023-05-02 ENCOUNTER — Encounter (HOSPITAL_BASED_OUTPATIENT_CLINIC_OR_DEPARTMENT_OTHER): Payer: Self-pay | Admitting: General Surgery

## 2023-05-02 ENCOUNTER — Ambulatory Visit (HOSPITAL_BASED_OUTPATIENT_CLINIC_OR_DEPARTMENT_OTHER): Payer: Medicare Other | Admitting: Certified Registered Nurse Anesthetist

## 2023-05-02 DIAGNOSIS — F418 Other specified anxiety disorders: Secondary | ICD-10-CM

## 2023-05-02 DIAGNOSIS — Z7984 Long term (current) use of oral hypoglycemic drugs: Secondary | ICD-10-CM | POA: Diagnosis not present

## 2023-05-02 DIAGNOSIS — E119 Type 2 diabetes mellitus without complications: Secondary | ICD-10-CM | POA: Insufficient documentation

## 2023-05-02 DIAGNOSIS — K60323 Anal fistula, complex, recurrent: Secondary | ICD-10-CM | POA: Diagnosis present

## 2023-05-02 DIAGNOSIS — Z87891 Personal history of nicotine dependence: Secondary | ICD-10-CM | POA: Insufficient documentation

## 2023-05-02 DIAGNOSIS — Z79899 Other long term (current) drug therapy: Secondary | ICD-10-CM | POA: Insufficient documentation

## 2023-05-02 DIAGNOSIS — Z933 Colostomy status: Secondary | ICD-10-CM | POA: Insufficient documentation

## 2023-05-02 DIAGNOSIS — G4733 Obstructive sleep apnea (adult) (pediatric): Secondary | ICD-10-CM | POA: Diagnosis not present

## 2023-05-02 DIAGNOSIS — F909 Attention-deficit hyperactivity disorder, unspecified type: Secondary | ICD-10-CM | POA: Diagnosis not present

## 2023-05-02 DIAGNOSIS — K610 Anal abscess: Secondary | ICD-10-CM

## 2023-05-02 DIAGNOSIS — I10 Essential (primary) hypertension: Secondary | ICD-10-CM | POA: Insufficient documentation

## 2023-05-02 DIAGNOSIS — Z01818 Encounter for other preprocedural examination: Secondary | ICD-10-CM

## 2023-05-02 DIAGNOSIS — K50913 Crohn's disease, unspecified, with fistula: Secondary | ICD-10-CM | POA: Insufficient documentation

## 2023-05-02 DIAGNOSIS — Z79631 Long term (current) use of antimetabolite agent: Secondary | ICD-10-CM | POA: Diagnosis not present

## 2023-05-02 DIAGNOSIS — Z86718 Personal history of other venous thrombosis and embolism: Secondary | ICD-10-CM | POA: Diagnosis not present

## 2023-05-02 HISTORY — DX: Polyneuropathy, unspecified: G62.9

## 2023-05-02 HISTORY — DX: Ventral hernia without obstruction or gangrene: K43.9

## 2023-05-02 HISTORY — DX: Attention-deficit hyperactivity disorder, unspecified type: F90.9

## 2023-05-02 HISTORY — PX: RECTAL EXAM UNDER ANESTHESIA: SHX6399

## 2023-05-02 HISTORY — PX: PLACEMENT OF SETON: SHX6029

## 2023-05-02 HISTORY — DX: Presence of spectacles and contact lenses: Z97.3

## 2023-05-02 LAB — POCT I-STAT, CHEM 8
BUN: 10 mg/dL (ref 8–23)
Calcium, Ion: 1.28 mmol/L (ref 1.15–1.40)
Chloride: 105 mmol/L (ref 98–111)
Creatinine, Ser: 0.4 mg/dL — ABNORMAL LOW (ref 0.61–1.24)
Glucose, Bld: 102 mg/dL — ABNORMAL HIGH (ref 70–99)
HCT: 45 % (ref 39.0–52.0)
Hemoglobin: 15.3 g/dL (ref 13.0–17.0)
Potassium: 3.8 mmol/L (ref 3.5–5.1)
Sodium: 141 mmol/L (ref 135–145)
TCO2: 22 mmol/L (ref 22–32)

## 2023-05-02 LAB — GLUCOSE, CAPILLARY: Glucose-Capillary: 116 mg/dL — ABNORMAL HIGH (ref 70–99)

## 2023-05-02 SURGERY — PLACEMENT, SETON
Anesthesia: Monitor Anesthesia Care

## 2023-05-02 MED ORDER — LACTATED RINGERS IV SOLN: INTRAVENOUS | Status: DC

## 2023-05-02 MED ORDER — PROPOFOL 10 MG/ML IV BOLUS
INTRAVENOUS | Status: AC
  Filled 2023-05-02: qty 20

## 2023-05-02 MED ORDER — TRAMADOL HCL 50 MG PO TABS: 50.0000 mg | ORAL_TABLET | Freq: Four times a day (QID) | ORAL | 0 refills | Status: DC | PRN

## 2023-05-02 MED ORDER — HYDROGEN PEROXIDE 3 % EX SOLN
CUTANEOUS | Status: DC | PRN
  Administered 2023-05-02: 1 via TOPICAL

## 2023-05-02 MED ORDER — ACETAMINOPHEN 500 MG PO TABS
1000.0000 mg | ORAL_TABLET | ORAL | Status: AC
Start: 2023-05-02 — End: 2023-05-02
  Administered 2023-05-02: 1000 mg via ORAL

## 2023-05-02 MED ORDER — MIDAZOLAM HCL 2 MG/2ML IJ SOLN
INTRAMUSCULAR | Status: DC | PRN
  Administered 2023-05-02: 1 mg via INTRAVENOUS

## 2023-05-02 MED ORDER — ACETAMINOPHEN 500 MG PO TABS: 1000.0000 mg | ORAL_TABLET | Freq: Once | ORAL | Status: DC

## 2023-05-02 MED ORDER — MIDAZOLAM HCL 2 MG/2ML IJ SOLN
INTRAMUSCULAR | Status: AC
  Filled 2023-05-02: qty 2

## 2023-05-02 MED ORDER — FENTANYL CITRATE (PF) 100 MCG/2ML IJ SOLN
INTRAMUSCULAR | Status: DC | PRN
  Administered 2023-05-02: 50 ug via INTRAVENOUS

## 2023-05-02 MED ORDER — FENTANYL CITRATE (PF) 100 MCG/2ML IJ SOLN
INTRAMUSCULAR | Status: AC
  Filled 2023-05-02: qty 2

## 2023-05-02 MED ORDER — STERILE WATER FOR IRRIGATION IR SOLN
Status: DC | PRN
  Administered 2023-05-02: 1000 mL

## 2023-05-02 MED ORDER — PROPOFOL 10 MG/ML IV BOLUS
INTRAVENOUS | Status: DC | PRN
  Administered 2023-05-02: 40 mg via INTRAVENOUS
  Administered 2023-05-02: 100 ug/kg/min via INTRAVENOUS

## 2023-05-02 MED ORDER — ACETAMINOPHEN 500 MG PO TABS
ORAL_TABLET | ORAL | Status: AC
  Filled 2023-05-02: qty 2

## 2023-05-02 MED ORDER — BUPIVACAINE-EPINEPHRINE 0.5% -1:200000 IJ SOLN
INTRAMUSCULAR | Status: DC | PRN
  Administered 2023-05-02: 30 mL

## 2023-05-02 MED ORDER — LIDOCAINE HCL (PF) 2 % IJ SOLN
INTRAMUSCULAR | Status: AC
Start: 2023-05-02 — End: ?
  Filled 2023-05-02: qty 5

## 2023-05-02 MED ORDER — SODIUM CHLORIDE 0.9% FLUSH: 3.0000 mL | Freq: Two times a day (BID) | INTRAVENOUS | Status: DC

## 2023-05-02 SURGICAL SUPPLY — 49 items
BENZOIN TINCTURE PRP APPL 2/3 (GAUZE/BANDAGES/DRESSINGS) ×2 IMPLANT
BLADE EXTENDED COATED 6.5IN (ELECTRODE) IMPLANT
BLADE SURG 10 STRL SS (BLADE) IMPLANT
BRIEF MESH DISP LRG (UNDERPADS AND DIAPERS) ×1 IMPLANT
COVER BACK TABLE 60X90IN (DRAPES) ×1 IMPLANT
COVER MAYO STAND STRL (DRAPES) ×1 IMPLANT
DRAPE HYSTEROSCOPY (MISCELLANEOUS) IMPLANT
DRAPE LAPAROTOMY 100X72 PEDS (DRAPES) ×1 IMPLANT
DRAPE SHEET LG 3/4 BI-LAMINATE (DRAPES) IMPLANT
DRAPE UTILITY XL STRL (DRAPES) ×1 IMPLANT
ELECT REM PT RETURN 9FT ADLT (ELECTROSURGICAL) ×1
ELECTRODE REM PT RTRN 9FT ADLT (ELECTROSURGICAL) ×1 IMPLANT
GAUZE 4X4 16PLY ~~LOC~~+RFID DBL (SPONGE) ×1 IMPLANT
GAUZE PAD ABD 8X10 STRL (GAUZE/BANDAGES/DRESSINGS) ×1 IMPLANT
GAUZE SPONGE 4X4 12PLY STRL (GAUZE/BANDAGES/DRESSINGS) ×1 IMPLANT
GLOVE BIO SURGEON STRL SZ 6.5 (GLOVE) ×1 IMPLANT
GLOVE INDICATOR 6.5 STRL GRN (GLOVE) ×1 IMPLANT
GOWN STRL REUS W/TWL XL LVL3 (GOWN DISPOSABLE) ×1 IMPLANT
HYDROGEN PEROXIDE 16OZ (MISCELLANEOUS) ×1 IMPLANT
IV CATH 14GX2 1/4 (CATHETERS) ×1 IMPLANT
IV CATH 18G SAFETY (IV SOLUTION) ×1 IMPLANT
KIT SIGMOIDOSCOPE (SET/KITS/TRAYS/PACK) IMPLANT
KIT TURNOVER CYSTO (KITS) ×1 IMPLANT
LEGGING LITHOTOMY PAIR STRL (DRAPES) IMPLANT
LOOP VASCLR MAXI BLUE 18IN ST (MISCELLANEOUS) ×1 IMPLANT
NDL HYPO 22X1.5 SAFETY MO (MISCELLANEOUS) ×1 IMPLANT
NEEDLE HYPO 22X1.5 SAFETY MO (MISCELLANEOUS) ×1
NS IRRIG 500ML POUR BTL (IV SOLUTION) ×1 IMPLANT
PACK BASIN DAY SURGERY FS (CUSTOM PROCEDURE TRAY) ×1 IMPLANT
PAD ARMBOARD 7.5X6 YLW CONV (MISCELLANEOUS) ×1 IMPLANT
PENCIL SMOKE EVACUATOR (MISCELLANEOUS) ×1 IMPLANT
SLEEVE SCD COMPRESS KNEE MED (STOCKING) ×1 IMPLANT
SPIKE FLUID TRANSFER (MISCELLANEOUS) ×1 IMPLANT
SPONGE HEMORRHOID 8X3CM (HEMOSTASIS) IMPLANT
SPONGE SURGIFOAM ABS GEL 12-7 (HEMOSTASIS) IMPLANT
SUCTION TUBE FRAZIER 10FR DISP (SUCTIONS) IMPLANT
SUT CHROMIC 2 0 SH (SUTURE) IMPLANT
SUT CHROMIC 3 0 SH 27 (SUTURE) IMPLANT
SUT ETHIBOND 0 (SUTURE) ×1 IMPLANT
SUT VIC AB 2-0 SH 27XBRD (SUTURE) IMPLANT
SUT VIC AB 3-0 SH 18 (SUTURE) IMPLANT
SUT VIC AB 3-0 SH 27XBRD (SUTURE) IMPLANT
SYR CONTROL 10ML LL (SYRINGE) ×1 IMPLANT
TIE VASCULAR MAXI BLUE 18IN ST (MISCELLANEOUS) ×1
TOWEL OR 17X24 6PK STRL BLUE (TOWEL DISPOSABLE) ×1 IMPLANT
TRAY DSU PREP LF (CUSTOM PROCEDURE TRAY) ×1 IMPLANT
TUBE CONNECTING 12X1/4 (SUCTIONS) ×1 IMPLANT
VASCULAR TIE MAXI BLUE 18IN ST (MISCELLANEOUS) ×1
YANKAUER SUCT BULB TIP NO VENT (SUCTIONS) ×1 IMPLANT

## 2023-05-02 NOTE — Discharge Instructions (Addendum)
DO NOT TAKE TYLENOL UNTIL AFTER 12:30p today.    Beginning the day after surgery:  You may sit in a tub of warm water 2-3 times a day to relieve discomfort.  Eat a regular diet high in fiber.  Avoid foods that give you constipation or diarrhea.  Avoid foods that are difficult to digest, such as seeds, nuts, corn or popcorn.  Do not go any longer than 2 days without a bowel movement.  You may take a dose of Milk of Magnesia if you become constipated.    Drink 6-8 glasses of water daily.  Walking is encouraged.  Avoid strenuous activity and heavy lifting for one month after surgery.    Call the office if you have any questions or concerns.  Call immediately if you develop:  Excessive rectal bleeding (more than a cup or passing large clots) Increased discomfort Fever greater than 100 F Difficulty urinating  Post Anesthesia Home Care Instructions  Activity: Get plenty of rest for the remainder of the day. A responsible adult should stay with you for 24 hours following the procedure.  For the next 24 hours, DO NOT: -Drive a car -Advertising copywriter -Drink alcoholic beverages -Take any medication unless instructed by your physician -Make any legal decisions or sign important papers.  Meals: Start with liquid foods such as gelatin or soup. Progress to regular foods as tolerated. Avoid greasy, spicy, heavy foods. If nausea and/or vomiting occur, drink only clear liquids until the nausea and/or vomiting subsides. Call your physician if vomiting continues.  Special Instructions/Symptoms: Your throat may feel dry or sore from the anesthesia or the breathing tube placed in your throat during surgery. If this causes discomfort, gargle with warm salt water. The discomfort should disappear within 24 hours.

## 2023-05-02 NOTE — Anesthesia Postprocedure Evaluation (Signed)
Anesthesia Post Note  Patient: EMERALD GEHRES  Procedure(s) Performed: PLACEMENT OF SETON incison ad drainage of perirectal abcess RECTAL EXAM UNDER ANESTHESIA INTERROGATION OF ANAL FISTULA     Patient location during evaluation: Phase II Anesthesia Type: MAC Level of consciousness: awake and alert, patient cooperative and oriented Pain management: pain level controlled Vital Signs Assessment: post-procedure vital signs reviewed and stable Respiratory status: spontaneous breathing, nonlabored ventilation and respiratory function stable Cardiovascular status: stable and blood pressure returned to baseline Postop Assessment: no apparent nausea or vomiting and able to ambulate Anesthetic complications: no   No notable events documented.  Last Vitals:  Vitals:   05/02/23 0636 05/02/23 0912  BP: 134/75 122/70  Pulse: 69   Resp: 17 19  Temp: 36.7 C (!) 36.3 C  SpO2: 99% 94%    Last Pain:  Vitals:   05/02/23 0912  TempSrc:   PainSc: 0-No pain                 Sophi Calligan,E. Eldana Isip

## 2023-05-02 NOTE — Op Note (Addendum)
05/02/2023  9:06 AM  PATIENT:  Richard [name]  68 y.o. male  Patient Care Team: Jeoffrey Massed, MD as PCP - General (Family Medicine) Christell Constant, MD as PCP - Cardiology (Cardiology) Judi Saa, DO as Consulting Physician (Sports Medicine) Cherlyn Roberts, MD as Consulting Physician (Dermatology) [name], Genice Rouge (Optometry) Glendale Chard, DO as Consulting Physician (Neurology) Maczis, Hedda Slade, PA-C as Physician Assistant (General Surgery) Romie Levee, MD as Consulting Physician (General Surgery) Adelene Idler, MD as Consulting Physician (Ophthalmology)  PRE-OPERATIVE DIAGNOSIS:  ANAL FISTULA RECURRENT  POST-OPERATIVE DIAGNOSIS:  ANAL FISTULA, RECURRENT.  Perirectal abscess  PROCEDURE:  PLACEMENT OF SETON  RECTAL EXAM UNDER ANESTHESIA  INTERROGATION OF ANAL FISTULA INCISION AND DRAINAGE OF PERIRECTAL ABSCESS   Surgeon(s): Romie Levee, MD  ASSISTANT: none   ANESTHESIA:   local and MAC  SPECIMEN:  No Specimen  DISPOSITION OF SPECIMEN:  N/A  COUNTS:  YES  PLAN OF CARE: Discharge to home after PACU  PATIENT DISPOSITION:  PACU - hemodynamically stable.  INDICATION: 68 y.o. M with Crohn's diease and perianal fistula and recurrent abscess   OR FINDINGS: Anterior perianal abscess with fistula  DESCRIPTION: the patient was identified in the preoperative holding area and taken to the OR where they were laid on the operating room table.  MAC anesthesia was induced without difficulty. The patient was then positioned in lithotomy position.  The patient was then prepped and draped in usual sterile fashion.  SCDs were noted to be in place prior to the initiation of anesthesia. A surgical timeout was performed indicating the correct patient, procedure, positioning and need for preoperative antibiotics.  A rectal block was performed using Marcaine with epinephrine.    I began with a digital rectal exam.  There was good tone noted.  The anal canal  was gently dilated to 2 fingerbreadths.  I then placed a Hill-Ferguson anoscope into the anal canal and evaluated this completely.  There was a distal anal scar that seemed consistent with internal opening.  I inspected the anterior perirectal region.  There were multiple pinhole drainage points.  I interrogated each of these with the fistula probe and hydrogen peroxide.  They all seem to be proceeding to a perianal fluid collection.  This was opened lateral to midline using electrocautery.  I then placed a seton in the fistula opening on the medial side of the fluid collection and passed this into the internal opening.  [name] was secured with 3 Ethibond sutures. Additional Marcaine was placed around the incision site and seton site.  A dressing was applied.  The patient was then awakened from anesthesia and sent to the postanesthesia care unit in stable condition.  All counts were correct per operating room staff.  Vanita Panda, MD  Colorectal and General Surgery [facility]

## 2023-05-02 NOTE — H&P (Signed)
PROVIDER:  Elenora Gamma, MD   MRN: [id number] DOB: 1955/04/21 DATE OF ENCOUNTER: 02/03/2023 Interval History:    Richard [name] is a 68 y.o. male with history of DM2, severe OSA, HTN, IBD who was originally admitted to [facility] on 02/07/2022 with perforated colon with peritonitis requiring emergent exploratory lap with resection of descending and sigmoid colon with transverse colostomy.  He had complicated and prolonged hospital course with encephalopathy due to PNA, rectal bleeding due to rectal varices as well as multiple rectal ulcers requiring hemostatic spray. He was transferred to CIR on 12/15 and was discharged ~1 mo later.  He was also diagnosed with a DVT and underwent treatment for this. He is now on Remicade but still having some issues.  They are having some pouching difficulties due to his hernia.        Past Medical History:  Diagnosis Date   Adult ADHD     Allergic rhinitis     Colon polyps 2012 and 2013    All hyperplastic except one adenomatous w/out high grade dysplasia (recall 2023 per Dr. Arlyce Dice)   COVID-19 virus infection 09/29/2020   Crohn disease (HCC)     Elevated transaminase level 2017    Viral hep screening neg.  Suspect fatty liver.   Erectile dysfunction     History of pneumonia     History of retinal tear      11/2022   Hyperlipidemia     Hypertension      hx of   Large bowel perforation (HCC) 02/2022    delayed, s/p colonoscopy w/biopsies (new dx crohn's/inflammatory BD)   Obesity     OSA on CPAP      Severe.  CPAP 10 cm H2O.  Follows Dr. Wynona Neat.   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 Maisie Fus did surg   Rectal varices      +rectal bleed while in hosp 2023   Right leg DVT (HCC)      04/12/22 R fem and pop   Seborrheic dermatitis of scalp      and face: hydrocort 2% cream bid prn to face, and fluocinonide 0.5% sol'n to scalp bid prn per Dr. Terri Piedra (04/2014)   Secondary male hypogonadism 04/2021   Type 2 diabetes mellitus  with microalbuminuria (HCC) DM dx-09/27/2015. Microalb 2020             Past Surgical History:  Procedure Laterality Date   COLONOSCOPY W/ POLYPECTOMY   04/2010; 10/2011    Initial screening TCS showed 6 polyps (one adenomatous).  Pt says he returned for repeat TCS 10/2011 showed one hyperplastic polyp: Recall 10 yrs per Dr. Arlyce Dice   EYE SURGERY        Laser surg L eye for retinal tear 11/16/22   FLEXIBLE SIGMOIDOSCOPY N/A 02/25/2022    Procedure: FLEXIBLE SIGMOIDOSCOPY;  Surgeon: Meridee Score Netty Starring., MD;  Location: [facility];  Service: Gastroenterology;  Laterality: N/A;   FLEXIBLE SIGMOIDOSCOPY N/A 03/04/2022    Procedure: FLEXIBLE SIGMOIDOSCOPY;  Surgeon: Shellia Cleverly, DO;  Location: [facility];  Service: Gastroenterology;  Laterality: N/A;   HEMOSTASIS CONTROL   02/25/2022    Procedure: HEMOSTASIS CONTROL;  Surgeon: Lemar Lofty., MD;  Location: [facility];  Service: Gastroenterology;;   HEMOSTASIS CONTROL   03/04/2022    Procedure: HEMOSTASIS CONTROL;  Surgeon: Shellia Cleverly, DO;  Location: [facility];  Service: Gastroenterology;;   INCISION AND DRAINAGE ABSCESS N/A 11/07/2021    Procedure: Incision and drainage of scrotal  abscess;  Surgeon: Romie Levee, MD;  Location: [facility];  Service: General;  Laterality: N/A;   IR SINUS/FIST TUBE CHK-NON GI   03/06/2022   LAPAROTOMY N/A 02/07/2022    Resection of descending and sigmoid colon with transverse colostomy.Procedure: EXPLORATORY LAPAROTOMY, COLON RESECTION, OSTOMY;  Surgeon: Romie Levee, MD;  Location: [facility];  Service: General;  Laterality: N/A;   MOUTH SURGERY       TRANSTHORACIC ECHOCARDIOGRAM        03/05/22 NORMAL             Family History  Problem Relation Age of Onset   Lung cancer Father     Cancer Father          nasal   Allergies Father     Colon cancer Neg Hx     Esophageal cancer Neg Hx     Rectal cancer Neg Hx     Stomach cancer Neg Hx          Social History          Socioeconomic History   Marital status: Married      Spouse name: Not on file   Number of children: Not on file   Years of education: Not on file   Highest education level: Bachelor's degree (e.g., BA, AB, BS)  Occupational History   Occupation: Special educational needs teacher  Tobacco Use   Smoking status: Former      Current packs/day: 0.00      Types: Cigarettes      Quit date: 07/17/2002      Years since quitting: 20.5   Smokeless tobacco: Never  Vaping Use   Vaping status: Never Used  Substance and Sexual Activity   Alcohol use: Yes      Alcohol/week: 7.0 standard drinks of alcohol      Types: 7 Standard drinks or equivalent per week      Comment: wine every night with dinner   Drug use: No   Sexual activity: Yes      Birth control/protection: None  Other Topics Concern   Not on file  Social History Narrative    Married, 1 child--grown.    Occupation: Child psychotherapist"    Tobacco: 20 pack-yr hx; quit 2004.    Alcohol: nightly cocktail    Drugs: none    Exercise: "walk some when I can".    Diet: normal.    Right Handed. Used to be left handed    Three story home    Drinks a lot of tea and occasional coffee    Social Determinants of Health        Financial Resource Strain: Low Risk  (01/29/2023)    Overall Financial Resource Strain (CARDIA)     Difficulty of Paying Living Expenses: Not hard at all  Food Insecurity: Low Risk  (01/30/2023)    Received from [facility]    Hunger Vital Sign     Worried About Running Out of Food in the Last Year: Never true     Ran Out of Food in the Last Year: Never true  Transportation Needs: No Transportation Needs (01/30/2023)    Received from [employer]     In the past 12 months, has lack of reliable transportation kept you from medical appointments, meetings, work or from getting things needed for daily living? : No  Physical Activity: Unknown (01/29/2023)    Exercise Vital Sign     Days of Exercise per Week: 0  days  Minutes of Exercise per Session: Not on file  Stress: Stress Concern Present (01/29/2023)    [employer] of Occupational Health - Occupational Stress Questionnaire     Feeling of Stress : To some extent  Social Connections: Moderately Isolated (01/29/2023)    Social Connection and Isolation Panel [NHANES]     Frequency of Communication with Friends and Family: Three times a week     Frequency of Social Gatherings with Friends and Family: Once a week     Attends Religious Services: Never     Database administrator or Organizations: No     Attends Engineer, structural: Not on file     Marital Status: Married  Catering manager Violence: Not At Risk (02/08/2022)    Humiliation, Afraid, Rape, and Kick questionnaire     Fear of Current or Ex-Partner: No     Emotionally Abused: No     Physically Abused: No     Sexually Abused: No     Current Medications    Current Outpatient Medications:    Accu-Chek Softclix Lancets lancets, SMARTSIG:Topical, Disp: 200 each, Rfl: 5   acetaminophen (TYLENOL) 325 MG tablet, Take 2 tablets (650 mg total) by mouth every 4 (four) hours as needed for mild pain, moderate pain, fever or headache., Disp: , Rfl:    amoxicillin-clavulanate (AUGMENTIN) 875-125 MG tablet, Take 1 tablet by mouth 2 (two) times daily., Disp: 20 tablet, Rfl: 0   ascorbic acid (VITAMIN C) 500 MG tablet, Take 1 tablet (500 mg total) by mouth 2 (two) times daily., Disp: , Rfl:    aspirin EC 81 MG tablet, Take 81 mg by mouth daily. Swallow whole., Disp: , Rfl:    atorvastatin (LIPITOR) 40 MG tablet, TAKE 1 TABLET BY MOUTH EVERY DAY, Disp: 90 tablet, Rfl: 0   Continuous Blood Gluc Receiver (FREESTYLE LIBRE 3 READER) DEVI, 1 each by Does not apply route every 14 (fourteen) days., Disp: 2 each, Rfl: 2   Continuous Glucose Sensor (FREESTYLE LIBRE 3 SENSOR) MISC, Place 1 sensor on the skin every 14 days. Use to check glucose continuously, Disp: 2 each, Rfl: 2   doxycycline  (VIBRAMYCIN) 100 MG capsule, Take 1 capsule (100 mg total) by mouth 2 (two) times daily for 10 days., Disp: 20 capsule, Rfl: 0   empagliflozin (JARDIANCE) 10 MG TABS tablet, Take 1 tablet (10 mg total) by mouth daily before breakfast., Disp: 90 tablet, Rfl: 3   ferrous sulfate 325 (65 FE) MG EC tablet, Take 325 mg by mouth 3 (three) times daily with meals., Disp: , Rfl:    glucose blood (ACCU-CHEK GUIDE) test strip, 1 each by Other route 4 (four) times daily -  before meals and at bedtime., Disp: 300 strip, Rfl: 5   inFLIXimab (REMICADE IV), Inject into the vein., Disp: , Rfl:    Insulin Pen Needle (PEN NEEDLES 3/16") 31G X 5 MM MISC, USE TO ADMINISTER TRESIBA, Disp: 100 each, Rfl: 5   magnesium oxide (MAG-OX) 400 (240 Mg) MG tablet, TAKE 2 TABLETS (800 MG TOTAL) BY MOUTH 3 (THREE) TIMES DAILY., Disp: 180 tablet, Rfl: 1   metFORMIN (GLUCOPHAGE) 1000 MG tablet, Take 1 tablet (1,000 mg total) by mouth 2 (two) times daily with a meal., Disp: 180 tablet, Rfl: 3   methotrexate (RHEUMATREX) 15 MG tablet, Take 1 tablet (15 mg total) by mouth once a week. Caution: Chemotherapy. Protect from light., Disp: 4 tablet, Rfl: 3   methylphenidate (METADATE ER) 20 MG ER tablet, Take 1  tablet (20 mg total) by mouth daily., Disp: 30 tablet, Rfl: 0   Multiple Vitamin (MULTIVITAMIN WITH MINERALS) TABS tablet, Take 1 tablet by mouth daily., Disp: , Rfl:    sildenafil (VIAGRA) 100 MG tablet, Take 1 tablet (100 mg total) by mouth daily as needed for erectile dysfunction., Disp: 30 tablet, Rfl: 6   thiamine (VITAMIN B-1) 100 MG tablet, Take 1 tablet (100 mg total) by mouth daily., Disp: , Rfl:    zinc sulfate 220 (50 Zn) MG capsule, Take 1 capsule (220 mg total) by mouth daily., Disp: 30 capsule, Rfl: 0   Allergies      Allergies  Allergen Reactions   Lobster [Shellfish Allergy] Nausea Only   Poison Ivy Extract Itching      Review of Systems - Negative except as stated above   Assessment and Plan:     Physical  Examination:   Vitals:   05/02/23 0636  BP: 134/75  Pulse: 69  Resp: 17  Temp: 98 F (36.7 C)  SpO2: 99%    Gen: NAD Abd: soft, ostomy well healed, large ventral hernia   Rectal: Small anterior midline opening with area of fluctuance in the left anterior perirectal area.  No drainage noted currently.   A/P   Richard [name] is a 68 y.o. male diagnosed with Crohn's disease, who underwent exploratory laparotomy and left colectomy with colostomy in November 2023.  He has had a prolonged recovery.  He has now switched from Remicade to [employer].  He developed an abscess in his perirectal region.  This was treated starting Augmentin and doxycycline.   He appears to have a recurrence of his anterior fistula.  I have recommended seton placement          Diagnoses and all orders for this visit:   Anal fistula, complex, recurrent         The plan was discussed in detail with the patient today, who expressed understanding.  The patient has my contact information, and understands to call me with any additional questions or concerns in the interval.  I would be happy to see the patient back sooner if the need arises.    Vanita Panda, MD Colon and Rectal Surgery [facility]

## 2023-05-02 NOTE — Anesthesia Procedure Notes (Signed)
Procedure Name: MAC Date/Time: 05/02/2023 8:32 AM  Performed by: Francie Massing, CRNAPre-anesthesia Checklist: Patient identified, Emergency Drugs available, Suction available and Patient being monitored Oxygen Delivery Method: Simple face mask

## 2023-05-02 NOTE — Transfer of Care (Signed)
Immediate Anesthesia Transfer of Care Note  Patient: Richard [name]  Procedure(s) Performed: Procedure(s) (LRB): PLACEMENT OF SETON incison ad drainage of perirectal abcess (N/A) RECTAL EXAM UNDER ANESTHESIA INTERROGATION OF ANAL FISTULA (N/A)  Patient Location: PACU  Anesthesia Type: MAC  Level of Consciousness: awake, alert , oriented and patient cooperative  Airway & Oxygen Therapy: Patient Spontanous Breathing Room Air  Post-op Assessment: Report given to PACU RN and Post -op Vital signs reviewed and stable  Post vital signs: Reviewed and stable  Complications: No apparent anesthesia complications Last Vitals:  Vitals Value Taken Time  BP 122/70 05/02/23 0912  Temp    Pulse 73 05/02/23 0914  Resp 20 05/02/23 0914  SpO2 94 % 05/02/23 0914  Vitals shown include unfiled device data.  Last Pain:  Vitals:   05/02/23 0636  TempSrc: Oral  PainSc: 0-No pain      Patients Stated Pain Goal: 4 (05/02/23 0636)  Complications: No notable events documented.

## 2023-05-03 ENCOUNTER — Encounter (HOSPITAL_BASED_OUTPATIENT_CLINIC_OR_DEPARTMENT_OTHER): Payer: Self-pay | Admitting: General Surgery

## 2023-05-08 ENCOUNTER — Ambulatory Visit: Payer: Medicare Other | Admitting: Family Medicine

## 2023-05-08 ENCOUNTER — Encounter: Payer: Self-pay | Admitting: Family Medicine

## 2023-05-08 VITALS — BP 117/66 | HR 91 | Temp 98.4°F | Ht 70.0 in | Wt 200.4 lb

## 2023-05-08 DIAGNOSIS — J209 Acute bronchitis, unspecified: Secondary | ICD-10-CM

## 2023-05-08 DIAGNOSIS — E119 Type 2 diabetes mellitus without complications: Secondary | ICD-10-CM | POA: Diagnosis not present

## 2023-05-08 DIAGNOSIS — D849 Immunodeficiency, unspecified: Secondary | ICD-10-CM

## 2023-05-08 DIAGNOSIS — Z7984 Long term (current) use of oral hypoglycemic drugs: Secondary | ICD-10-CM

## 2023-05-08 MED ORDER — DOXYCYCLINE HYCLATE 100 MG PO CAPS: 100.0000 mg | ORAL_CAPSULE | Freq: Two times a day (BID) | ORAL | 0 refills | Status: AC

## 2023-05-08 MED ORDER — AMOXICILLIN-POT CLAVULANATE 875-125 MG PO TABS: 1.0000 | ORAL_TABLET | Freq: Two times a day (BID) | ORAL | 0 refills | Status: DC

## 2023-05-08 MED ORDER — HYDROCODONE BIT-HOMATROP MBR 5-1.5 MG/5ML PO SOLN: ORAL | 0 refills | Status: DC

## 2023-05-08 MED ORDER — HYDROCODONE-ACETAMINOPHEN 5-325 MG PO TABS: 1.0000 | ORAL_TABLET | Freq: Four times a day (QID) | ORAL | 0 refills | Status: DC | PRN

## 2023-05-08 NOTE — Patient Instructions (Signed)
Stop Jardiance.  Increase metformin to 1000 mg twice a day.

## 2023-05-08 NOTE — Progress Notes (Signed)
OFFICE VISIT  05/08/2023  CC:  Chief Complaint  Patient presents with   Cough    Productive with phlegm x 3-4 weeks; alka-seltzer cold and cough, Delsym    Patient is a 68 y.o. male who presents for cough.  HPI: Approximately 10 to 12 days ago he developed nasal congestion, postnasal drip, and cough.  Symptoms persisting.  No shortness of breath, wheezing, or chest pain.  No fever. The cough does keep him up at night.  Sugars have been very well-controlled--> 90-130 range over the last several weeks. He takes metformin 500 mg twice a day and Jardiance 10 mg a day.  He got perineal fistula surgery again last week (seton placed).  He reports that he is getting a little bit of drainage and it does not hurt him.  The drainage is less and less each day. He has follow-up with his general surgeon on March 11.  Since I last saw him he got a colonoscopy and states that his Crohn's disease is in remission. He is on Skyrizi, most recent injection was 04/07/2023.  Next injection is due in 4 weeks.  He has had at least 1 year history of painful little bumps on the fingers of both hands.  Sometimes they get deep pink/red. Past Medical History:  Diagnosis Date   ADHD (attention deficit hyperactivity disorder)    Adult ADHD    Allergic rhinitis    Colon polyps 2012 and 2013   All hyperplastic except one adenomatous w/out high grade dysplasia (recall 2023 per Dr. Arlyce Dice)   Colostomy in place [facility]) 01/2022   COVID-19 virus infection 09/29/2020   Crohn disease (HCC)    Follows w/ [facility]. s/p left colectomy w/ colostomy in 2023   Elevated transaminase level 2017   Viral hep screening neg.  Suspect fatty liver.   Erectile dysfunction    History of pneumonia    History of retinal tear    11/2022   Hyperlipidemia    Hypertension    hx of   Large bowel perforation (HCC) 02/2022   delayed, s/p colonoscopy w/biopsies (new dx crohn's/inflammatory BD)   Obesity    OSA on  CPAP    Severe.  CPAP 10 cm H2O.  Follows [facility].   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 Maisie Fus did surg   Peripheral neuropathy    feet   Pneumonia    several years ago as of 04/29/23   Rectal varices    +rectal bleed while in hosp 2023   Right leg DVT (HCC)    04/12/22 R fem and pop, treated for 3 months with Eliquis   Seborrheic dermatitis of scalp    and face: hydrocort 2% cream bid prn to face, and fluocinonide 0.5% sol'n to scalp bid prn per Dr. Terri Piedra (04/2014)   Secondary male hypogonadism 04/2021   Type 2 diabetes mellitus with microalbuminuria (HCC) DM dx-09/27/2015. Microalb 2020   Follows w/ PCP, Dr. Earley Favor.   Ventral hernia    large   Wears glasses     Past Surgical History:  Procedure Laterality Date   COLECTOMY Left 01/2022   COLONOSCOPY  2025   COLONOSCOPY W/ POLYPECTOMY  04/2010; 10/2011   Initial screening TCS showed 6 polyps (one adenomatous).  Pt says he returned for repeat TCS 10/2011 showed one hyperplastic polyp: Recall 10 yrs per Dr. Arlyce Dice   EYE SURGERY     Laser surg L eye for retinal tear 11/16/22   FLEXIBLE SIGMOIDOSCOPY N/A  02/25/2022   Procedure: FLEXIBLE SIGMOIDOSCOPY;  Surgeon: Lemar Lofty., MD;  Location: [facility];  Service: Gastroenterology;  Laterality: N/A;   FLEXIBLE SIGMOIDOSCOPY N/A 03/04/2022   Procedure: FLEXIBLE SIGMOIDOSCOPY;  Surgeon: Shellia Cleverly, DO;  Location: [facility];  Service: Gastroenterology;  Laterality: N/A;   HEMOSTASIS CONTROL  02/25/2022   Procedure: HEMOSTASIS CONTROL;  Surgeon: Lemar Lofty., MD;  Location: [facility];  Service: Gastroenterology;;   HEMOSTASIS CONTROL  03/04/2022   Procedure: HEMOSTASIS CONTROL;  Surgeon: Shellia Cleverly, DO;  Location: [facility];  Service: Gastroenterology;;   INCISION AND DRAINAGE ABSCESS N/A 11/07/2021   Procedure: Incision and drainage of scrotal abscess;  Surgeon: Romie Levee, MD;  Location: [facility];   Service: General;  Laterality: N/A;   IR SINUS/FIST TUBE CHK-NON GI  03/06/2022   LAPAROTOMY N/A 02/07/2022   Resection of descending and sigmoid colon with transverse colostomy.Procedure: EXPLORATORY LAPAROTOMY, COLON RESECTION, OSTOMY;  Surgeon: Romie Levee, MD;  Location: [facility];  Service: General;  Laterality: N/A;   MOUTH SURGERY     PLACEMENT OF SETON N/A 05/02/2023   Procedure: PLACEMENT OF SETON incison ad drainage of perirectal abcess;  Surgeon: Romie Levee, MD;  Location: [facility];  Service: General;  Laterality: N/A;   RECTAL EXAM UNDER ANESTHESIA N/A 05/02/2023   Procedure: RECTAL EXAM UNDER ANESTHESIA INTERROGATION OF ANAL FISTULA;  Surgeon: Romie Levee, MD;  Location: [facility];  Service: General;  Laterality: N/A;   TRANSTHORACIC ECHOCARDIOGRAM     03/05/22 NORMAL, EF 60 - 65%    Outpatient Medications Prior to Visit  Medication Sig Dispense Refill   Accu-Chek Softclix Lancets lancets SMARTSIG:Topical 200 each 5   acetaminophen (TYLENOL) 325 MG tablet Take 2 tablets (650 mg total) by mouth every 4 (four) hours as needed for mild pain, moderate pain, fever or headache.     ascorbic acid (VITAMIN C) 500 MG tablet Take 1 tablet (500 mg total) by mouth 2 (two) times daily.     aspirin EC 81 MG tablet Take 81 mg by mouth daily. Swallow whole.     atorvastatin (LIPITOR) 40 MG tablet TAKE 1 TABLET BY MOUTH EVERY DAY 90 tablet 0   Continuous Blood Gluc Receiver (FREESTYLE LIBRE 3 READER) DEVI 1 each by Does not apply route every 14 (fourteen) days. 2 each 2   Continuous Glucose Sensor (FREESTYLE LIBRE 3 SENSOR) MISC Place 1 sensor on the skin every 14 days. Use to check glucose continuously 2 each 2   ferrous sulfate 325 (65 FE) MG EC tablet Take 325 mg by mouth 3 (three) times daily with meals.     glucose blood (ACCU-CHEK GUIDE) test strip 1 each by Other route 4 (four) times daily -  before meals and at bedtime. 300 strip 5   magnesium oxide  (MAG-OX) 400 (240 Mg) MG tablet TAKE 2 TABLETS (800 MG TOTAL) BY MOUTH 3 (THREE) TIMES DAILY. (Patient taking differently: Take 800 mg by mouth 2 (two) times daily.) 180 tablet 1   metFORMIN (GLUCOPHAGE) 1000 MG tablet TAKE 1 TABLET (1,000 MG TOTAL) BY MOUTH TWICE A DAY WITH FOOD (Patient taking differently: 500 mg 2 (two) times daily with a meal.) 180 tablet 3   methylphenidate (METADATE ER) 20 MG ER tablet Take 1 tablet (20 mg total) by mouth daily. 30 tablet 0   Multiple Vitamin (MULTIVITAMIN WITH MINERALS) TABS tablet Take 1 tablet by mouth daily.     Risankizumab-rzaa (SKYRIZI) 360 MG/2.4ML SOCT Inject into  the skin. Every eight weeks     sildenafil (VIAGRA) 100 MG tablet Take 1 tablet (100 mg total) by mouth daily as needed for erectile dysfunction. 30 tablet 6   thiamine (VITAMIN B-1) 100 MG tablet Take 1 tablet (100 mg total) by mouth daily.     zinc sulfate 220 (50 Zn) MG capsule Take 1 capsule (220 mg total) by mouth daily. 30 capsule 0   empagliflozin (JARDIANCE) 10 MG TABS tablet Take 1 tablet (10 mg total) by mouth daily before breakfast. 90 tablet 3   traMADol (ULTRAM) 50 MG tablet Take 1-2 tablets (50-100 mg total) by mouth every 6 (six) hours as needed. 30 tablet 0   No facility-administered medications prior to visit.    Allergies  Allergen Reactions   Lobster [Shellfish Allergy] Nausea Only   Poison Ivy Extract Itching    Review of Systems  As per HPI  PE:    05/08/2023    1:17 PM 05/02/2023   10:05 AM 05/02/2023    9:12 AM  Vitals with BMI  Height 5\' 10"     Weight 200 lbs 6 oz    BMI 28.75    Systolic 117 114 161  Diastolic 66 65 70  Pulse 91 55      Physical Exam  VS: noted--normal. Gen: alert, NAD, NONTOXIC APPEARING. HEENT: eyes without injection, drainage, or swelling.  Ears: EACs clear, TMs with normal light reflex and landmarks.  Nose: Clear rhinorrhea, with some dried, crusty exudate adherent to mildly injected mucosa.  No purulent d/c.  No paranasal  sinus TTP.  No facial swelling.  Throat and mouth without focal lesion.  No pharyngial swelling, erythema, or exudate.   Neck: supple, no LAD.   LUNGS: He has some coarse expiratory rhonchi that clear with coughing.  No crackles.  Aeration is normal, expiratory phase is not prolonged.  Breathing is nonlabored.   CV: RRR, no m/r/g. EXT: no c/c/e SKIN: no rash He has multiple small subcutaneous tender nodules about 1-79mm size on the distal one half of his fingers of both hands, dorsal surface.  Seem to be focused more around the DIPs than the PIPs.  None on MCPs. A few of these appear pink but none are significantly erythematous. LABS:  Last CBC Lab Results  Component Value Date   WBC 10.2 01/29/2023   HGB 15.3 05/02/2023   HCT 45.0 05/02/2023   MCV 98.6 01/29/2023   MCH 30.4 05/24/2022   RDW 13.8 01/29/2023   PLT 227.0 01/29/2023   Last metabolic panel Lab Results  Component Value Date   GLUCOSE 102 (H) 05/02/2023   NA 141 05/02/2023   K 3.8 05/02/2023   CL 105 05/02/2023   CO2 27 01/29/2023   BUN 10 05/02/2023   CREATININE 0.40 (L) 05/02/2023   GFR 90.35 01/29/2023   CALCIUM 9.0 01/29/2023   PHOS 2.3 (L) 03/01/2022   PROT 7.4 06/11/2022   ALBUMIN 3.8 11/28/2022   BILITOT 0.4 06/11/2022   ALKPHOS 114 11/28/2022   AST 18 11/28/2022   ALT 23 11/28/2022   ANIONGAP 7 04/12/2022   Last hemoglobin A1c Lab Results  Component Value Date   HGBA1C 5.6 03/04/2023   HGBA1C 5.6 03/04/2023   HGBA1C 5.6 (A) 03/04/2023   HGBA1C 5.6 03/04/2023   Lab Results  Component Value Date   VITAMINB12 614 11/28/2022   IMPRESSION AND PLAN:  #1 URI with acute bronchitis, prolonged symptoms. He is relatively immunocompromised due to [employer].   Will treat as  high risk--> doxycycline 100 mg twice daily x 10 days and Augmentin 875 twice daily x 10 days. Hydrocodone cough syrup prescribed today, 1 to 2 teaspoon twice daily as needed, 120 mL. If this is not available at pharmacies (back-order  lately b/c flu season) then we will try hydrocodone/APAP 5/325 1 tab every 6 hours as needed.  #2 diabetes without complication, well-controlled. Stop Jardiance and increase metformin to 1000 mg twice daily. See #3 below.  #3 recurrent perianal abscess and fistula.  Doing well status post fistula surgery. It is hard to say whether his being on Jardiance has contributed to this problem or not. Will go ahead and stop it, though, to take this out of the equation.  #4 painful finger nodules. Autoimmune etiology possible given his diagnosis of Crohn's disease. Reassured, no treatment at this time.  An After Visit Summary was printed and given to the patient.  FOLLOW UP: Return if symptoms worsen or fail to improve.  Signed:  Santiago Bumpers, MD           05/08/2023

## 2023-05-09 ENCOUNTER — Telehealth: Payer: Self-pay

## 2023-05-09 NOTE — Telephone Encounter (Signed)
HYDROcodone bit-homatropine (HYCODAN) 5-1.5 MG/5ML syrup   Pharmacy states this medication is on back order. Please advise

## 2023-06-04 ENCOUNTER — Encounter: Payer: Self-pay | Admitting: Nurse Practitioner

## 2023-06-04 ENCOUNTER — Ambulatory Visit: Payer: Medicare Other | Admitting: Nurse Practitioner

## 2023-06-04 VITALS — BP 138/82 | HR 77 | Ht 70.0 in | Wt 205.8 lb

## 2023-06-04 DIAGNOSIS — G4733 Obstructive sleep apnea (adult) (pediatric): Secondary | ICD-10-CM | POA: Diagnosis not present

## 2023-06-04 NOTE — Patient Instructions (Addendum)
 Continue to use CPAP every night, minimum of 4-6 hours a night.  Change equipment as directed. Wash your tubing with warm soap and water daily, hang to dry. Wash humidifier portion weekly. Use bottled, distilled water and change daily Be aware of reduced alertness and do not drive or operate heavy machinery if experiencing this or drowsiness.  Exercise encouraged, as tolerated. Healthy weight management discussed.  Avoid or decrease alcohol consumption and medications that make you more sleepy, if possible. Notify if persistent daytime sleepiness occurs even with consistent use of PAP therapy.  Follow up with Dr. Wynona Neat or NP in one year, or sooner, if needed

## 2023-06-04 NOTE — Assessment & Plan Note (Signed)
 Severe OSA on CPAP. Excellent compliance and control. Receives benefit from use. Aware of risks of untreated OSA. Understands proper use/care of device. Healthy weight management reviewed. Safe driving practices encouraged.  Patient Instructions  Continue to use CPAP every night, minimum of 4-6 hours a night.  Change equipment as directed. Wash your tubing with warm soap and water daily, hang to dry. Wash humidifier portion weekly. Use bottled, distilled water and change daily Be aware of reduced alertness and do not drive or operate heavy machinery if experiencing this or drowsiness.  Exercise encouraged, as tolerated. Healthy weight management discussed.  Avoid or decrease alcohol consumption and medications that make you more sleepy, if possible. Notify if persistent daytime sleepiness occurs even with consistent use of PAP therapy.  Follow up with Dr. Wynona Neat or NP in one year, or sooner, if needed

## 2023-06-04 NOTE — Progress Notes (Signed)
 @Patient  ID: Richard [name], male    DOB: [birth year], 68 y.o.   MRN: [id number]  Chief Complaint  Patient presents with   Follow-up    Patient is doing good     Referring provider: Jeoffrey Massed, MD  HPI: 68 year old male, former smoker followed for OSA on CPAP. He is a patient of Dr. Trena Platt and last seen in office 03/27/2021. Past medical history significant for HTN, DM, Crohn's, ADHD, anxiety, HLD.  TEST/EVENTS:  2011 NPSG: AHI 94/h  03/27/2021: OV with Dr. Wynona Neat.  Uses CPAP nightly.  Benefits from use.  Goes to bed between 830 and 11:30 PM.  Wakes up about 9 AM.  Falls asleep within minutes.  1-2 awakenings.  Weight is stable.  Last sleep study was 2011 at [facility].  Has a machine that he has at home and a travel machine.  Travel machine recently quit working.  Order placed for new CPAP.  06/04/2023: Today - follow up Patient presents today for overdue follow-up regarding OSA on CPAP.  He wears his CPAP every night.  Does not feel like he could sleep without it.  He has a home CPAP and then he also has a travel CPAP.  No issues with leaks or mask fit.  Sleeps well at night.  Energy levels are good during the day.  Wakes up feeling rested.  Denies any issues with drowsy driving, morning headaches or sleep parasomnia/paralysis.  05/04/2023-06/02/2023: CPAP 5-20 cmH2O 30/30 days; 97% >4 hr; average use 7 hr 14 min Pressure 95th 9 Leaks 95th 3.3 AHI 0.9  Allergies  Allergen Reactions   Lobster [Shellfish Allergy] Nausea Only   Poison Ivy Extract Itching    Immunization History  Administered Date(s) Administered   Fluad Quad(high Dose 65+) 01/23/2022   Fluad Trivalent(High Dose 65+) 12/03/2022   Influenza,inj,Quad PF,6+ Mos 01/03/2016, 02/26/2017, 12/12/2017, 01/15/2019, 01/20/2020, 01/26/2021   PFIZER(Purple Top)SARS-COV-2 Vaccination 06/19/2019, 07/14/2019, 02/07/2020   PNEUMOCOCCAL CONJUGATE-20 07/11/2020   Pneumococcal Polysaccharide-23 03/04/2016   Respiratory  Syncytial Virus Vaccine,Recomb Aduvanted(Arexvy) 12/09/2022   Td 02/06/2010   Tdap 07/11/2020   Zoster Recombinant(Shingrix) 03/03/2018, 09/04/2018    Past Medical History:  Diagnosis Date   ADHD (attention deficit hyperactivity disorder)    Adult ADHD    Allergic rhinitis    Colon polyps 2012 and 2013   All hyperplastic except one adenomatous w/out high grade dysplasia (recall 2023 per Dr. Arlyce Dice)   Colostomy in place [facility]) 01/2022   COVID-19 virus infection 09/29/2020   Crohn disease (HCC)    Follows w/ [facility]. s/p left colectomy w/ colostomy in 2023   Elevated transaminase level 2017   Viral hep screening neg.  Suspect fatty liver.   Erectile dysfunction    History of pneumonia    History of retinal tear    11/2022   Hyperlipidemia    Hypertension    hx of   Large bowel perforation (HCC) 02/2022   delayed, s/p colonoscopy w/biopsies (new dx crohn's/inflammatory BD)   Obesity    OSA on CPAP    Severe.  CPAP 10 cm H2O.  Follows [facility].   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 Maisie Fus did surg   Peripheral neuropathy    feet   Pneumonia    several years ago as of 04/29/23   Rectal varices    +rectal bleed while in hosp 2023   Right leg DVT (HCC)    04/12/22 R fem and pop, treated for 3 months  with Eliquis   Seborrheic dermatitis of scalp    and face: hydrocort 2% cream bid prn to face, and fluocinonide 0.5% sol'n to scalp bid prn per Dr. Terri Piedra (04/2014)   Secondary male hypogonadism 04/2021   Type 2 diabetes mellitus with microalbuminuria (HCC) DM dx-09/27/2015. Microalb 2020   Follows w/ PCP, Dr. Earley Favor.   Ventral hernia    large   Wears glasses     Tobacco History: Social History   Tobacco Use  Smoking Status Former   Current packs/day: 0.00   Types: Cigarettes   Quit date: 07/17/2002   Years since quitting: 20.8  Smokeless Tobacco Never   Counseling given: Not Answered   Outpatient Medications  Prior to Visit  Medication Sig Dispense Refill   Accu-Chek Softclix Lancets lancets SMARTSIG:Topical 200 each 5   acetaminophen (TYLENOL) 325 MG tablet Take 2 tablets (650 mg total) by mouth every 4 (four) hours as needed for mild pain, moderate pain, fever or headache.     ascorbic acid (VITAMIN C) 500 MG tablet Take 1 tablet (500 mg total) by mouth 2 (two) times daily.     aspirin EC 81 MG tablet Take 81 mg by mouth daily. Swallow whole.     atorvastatin (LIPITOR) 40 MG tablet TAKE 1 TABLET BY MOUTH EVERY DAY 90 tablet 0   Continuous Blood Gluc Receiver (FREESTYLE LIBRE 3 READER) DEVI 1 each by Does not apply route every 14 (fourteen) days. 2 each 2   Continuous Glucose Sensor (FREESTYLE LIBRE 3 SENSOR) MISC Place 1 sensor on the skin every 14 days. Use to check glucose continuously 2 each 2   ferrous sulfate 325 (65 FE) MG EC tablet Take 325 mg by mouth 3 (three) times daily with meals.     glucose blood (ACCU-CHEK GUIDE) test strip 1 each by Other route 4 (four) times daily -  before meals and at bedtime. 300 strip 5   HYDROcodone bit-homatropine (HYCODAN) 5-1.5 MG/5ML syrup 1-2 tsp po bid prn cough 120 mL 0   HYDROcodone-acetaminophen (NORCO/VICODIN) 5-325 MG tablet Take 1 tablet by mouth every 6 (six) hours as needed for moderate pain (pain score 4-6). 30 tablet 0   magnesium oxide (MAG-OX) 400 (240 Mg) MG tablet TAKE 2 TABLETS (800 MG TOTAL) BY MOUTH 3 (THREE) TIMES DAILY. (Patient taking differently: Take 800 mg by mouth 2 (two) times daily.) 180 tablet 1   metFORMIN (GLUCOPHAGE) 1000 MG tablet TAKE 1 TABLET (1,000 MG TOTAL) BY MOUTH TWICE A DAY WITH FOOD (Patient taking differently: 500 mg 2 (two) times daily with a meal.) 180 tablet 3   methylphenidate (METADATE ER) 20 MG ER tablet Take 1 tablet (20 mg total) by mouth daily. 30 tablet 0   Multiple Vitamin (MULTIVITAMIN WITH MINERALS) TABS tablet Take 1 tablet by mouth daily.     Risankizumab-rzaa (SKYRIZI) 360 MG/2.4ML SOCT Inject into the  skin. Every eight weeks     sildenafil (VIAGRA) 100 MG tablet Take 1 tablet (100 mg total) by mouth daily as needed for erectile dysfunction. 30 tablet 6   thiamine (VITAMIN B-1) 100 MG tablet Take 1 tablet (100 mg total) by mouth daily.     zinc sulfate 220 (50 Zn) MG capsule Take 1 capsule (220 mg total) by mouth daily. 30 capsule 0   amoxicillin-clavulanate (AUGMENTIN) 875-125 MG tablet Take 1 tablet by mouth 2 (two) times daily. (Patient not taking: Reported on 06/04/2023) 20 tablet 0   No facility-administered medications prior to visit.  Review of Systems:   Constitutional: No weight loss or gain, night sweats, fevers, chills, fatigue, or lassitude. HEENT: No headaches, nasal congestion, or post nasal drip CV:  No orthopnea, PND Resp: No shortness of breath with exertion or at rest. No cough GI:  No heartburn, indigestion GU: No nocturia  Skin: No rash, lesions, ulcerations Neuro: No dizziness or lightheadedness.  Psych: No depression or anxiety. Mood stable.     Physical Exam:  BP 138/82 (BP Location: Left Arm, Patient Position: Sitting, Cuff Size: Normal)   Pulse 77   Ht 5\' 10"  (1.778 m)   Wt 205 lb 12.8 oz (93.4 kg)   SpO2 96%   BMI 29.53 kg/m   GEN: Pleasant, interactive, well-appearing; in no acute distress HEENT:  Normocephalic and atraumatic. PERRLA. Sclera white. Nasal turbinates pink, moist and patent bilaterally. No rhinorrhea present. Oropharynx pink and moist, without exudate or edema. No lesions, ulcerations, or postnasal drip. Mallampati II NECK:  Supple w/ fair ROM. Thyroid symmetrical with no goiter or nodules palpated. No lymphadenopathy.   CV: RRR, no m/r/g, no peripheral edema. Pulses intact, +2 bilaterally. No cyanosis, pallor or clubbing. PULMONARY:  Unlabored, regular breathing. Clear bilaterally A&P w/o wheezes/rales/rhonchi. No accessory muscle use.  GI: BS present and normoactive. Soft, non-tender to palpation.  MSK: No erythema, warmth or  tenderness. Cap refil <2 sec all extrem.  Neuro: A/Ox3. No focal deficits noted.   Skin: Warm, no lesions or rashe Psych: Normal affect and behavior. Judgement and thought content appropriate.     Lab Results:  CBC    Component Value Date/Time   WBC 10.2 01/29/2023 1218   RBC 4.17 (L) 01/29/2023 1218   HGB 15.3 05/02/2023 0651   HCT 45.0 05/02/2023 0651   PLT 227.0 01/29/2023 1218   MCV 98.6 01/29/2023 1218   MCH 30.4 05/24/2022 1620   MCHC 33.8 01/29/2023 1218   RDW 13.8 01/29/2023 1218   LYMPHSABS 1.3 01/29/2023 1218   MONOABS 0.9 01/29/2023 1218   EOSABS 0.4 01/29/2023 1218   BASOSABS 0.0 01/29/2023 1218    BMET    Component Value Date/Time   NA 141 05/02/2023 0651   NA 140 11/29/2022 0000   K 3.8 05/02/2023 0651   CL 105 05/02/2023 0651   CO2 27 01/29/2023 1218   GLUCOSE 102 (H) 05/02/2023 0651   BUN 10 05/02/2023 0651   BUN 17 11/28/2022 0000   CREATININE 0.40 (L) 05/02/2023 0651   CREATININE 0.65 (L) 05/24/2022 1620   CALCIUM 9.0 01/29/2023 1218   GFRNONAA >60 04/12/2022 1851    BNP    Component Value Date/Time   BNP 105.7 (H) 03/04/2022 0425     Imaging:  No results found.  Administration History     None           No data to display          No results found for: "NITRICOXIDE"      Assessment & Plan:   SEVERE OBSTRUCTIVE SLEEP APNEA with AHI 94 Severe OSA on CPAP. Excellent compliance and control. Receives benefit from use. Aware of risks of untreated OSA. Understands proper use/care of device. Healthy weight management reviewed. Safe driving practices encouraged.  Patient Instructions  Continue to use CPAP every night, minimum of 4-6 hours a night.  Change equipment as directed. Wash your tubing with warm soap and water daily, hang to dry. Wash humidifier portion weekly. Use bottled, distilled water and change daily Be aware of reduced alertness and do not  drive or operate heavy machinery if experiencing this or drowsiness.   Exercise encouraged, as tolerated. Healthy weight management discussed.  Avoid or decrease alcohol consumption and medications that make you more sleepy, if possible. Notify if persistent daytime sleepiness occurs even with consistent use of PAP therapy.  Follow up with Dr. Wynona Neat or NP in one year, or sooner, if needed    Advised if symptoms do not improve or worsen, to please contact office for sooner follow up or seek emergency care.   I spent 25 minutes of dedicated to the care of this patient on the date of this encounter to include pre-visit review of records, face-to-face time with the patient discussing conditions above, post visit ordering of testing, clinical documentation with the electronic health record, making appropriate referrals as documented, and communicating necessary findings to members of the patients care team.  Noemi Chapel, NP 06/04/2023  Pt aware and understands NP's role.

## 2023-07-07 ENCOUNTER — Encounter: Payer: Self-pay | Admitting: Family Medicine

## 2023-07-07 ENCOUNTER — Ambulatory Visit (INDEPENDENT_AMBULATORY_CARE_PROVIDER_SITE_OTHER): Payer: Medicare Other | Admitting: Family Medicine

## 2023-07-07 VITALS — BP 124/84 | HR 79 | Ht 70.0 in | Wt 208.0 lb

## 2023-07-07 DIAGNOSIS — Z7984 Long term (current) use of oral hypoglycemic drugs: Secondary | ICD-10-CM | POA: Diagnosis not present

## 2023-07-07 DIAGNOSIS — Z0000 Encounter for general adult medical examination without abnormal findings: Secondary | ICD-10-CM

## 2023-07-07 DIAGNOSIS — E559 Vitamin D deficiency, unspecified: Secondary | ICD-10-CM

## 2023-07-07 DIAGNOSIS — K439 Ventral hernia without obstruction or gangrene: Secondary | ICD-10-CM

## 2023-07-07 DIAGNOSIS — Z9889 Other specified postprocedural states: Secondary | ICD-10-CM

## 2023-07-07 DIAGNOSIS — F988 Other specified behavioral and emotional disorders with onset usually occurring in childhood and adolescence: Secondary | ICD-10-CM

## 2023-07-07 DIAGNOSIS — E7800 Pure hypercholesterolemia, unspecified: Secondary | ICD-10-CM

## 2023-07-07 DIAGNOSIS — Z125 Encounter for screening for malignant neoplasm of prostate: Secondary | ICD-10-CM | POA: Diagnosis not present

## 2023-07-07 DIAGNOSIS — Z933 Colostomy status: Secondary | ICD-10-CM

## 2023-07-07 DIAGNOSIS — K50111 Crohn's disease of large intestine with rectal bleeding: Secondary | ICD-10-CM | POA: Diagnosis not present

## 2023-07-07 DIAGNOSIS — E119 Type 2 diabetes mellitus without complications: Secondary | ICD-10-CM

## 2023-07-07 DIAGNOSIS — Z9049 Acquired absence of other specified parts of digestive tract: Secondary | ICD-10-CM

## 2023-07-07 LAB — POCT GLYCOSYLATED HEMOGLOBIN (HGB A1C)
HbA1c POC (<> result, manual entry): 5.7 % (ref 4.0–5.6)
HbA1c, POC (controlled diabetic range): 5.7 % (ref 0.0–7.0)
HbA1c, POC (prediabetic range): 5.7 % (ref 5.7–6.4)
Hemoglobin A1C: 5.7 % — AB (ref 4.0–5.6)

## 2023-07-07 LAB — COMPREHENSIVE METABOLIC PANEL WITH GFR
ALT: 14 U/L (ref 0–53)
AST: 15 U/L (ref 0–37)
Albumin: 4.4 g/dL (ref 3.5–5.2)
Alkaline Phosphatase: 79 U/L (ref 39–117)
BUN: 11 mg/dL (ref 6–23)
CO2: 28 meq/L (ref 19–32)
Calcium: 9.7 mg/dL (ref 8.4–10.5)
Chloride: 103 meq/L (ref 96–112)
Creatinine, Ser: 0.51 mg/dL (ref 0.40–1.50)
GFR: 105.1 mL/min (ref >60.00)
Glucose, Bld: 122 mg/dL — ABNORMAL HIGH (ref 70–99)
Potassium: 4.3 meq/L (ref 3.5–5.1)
Sodium: 140 meq/L (ref 135–145)
Total Bilirubin: 0.6 mg/dL (ref 0.2–1.2)
Total Protein: 7.8 g/dL (ref 6.0–8.3)

## 2023-07-07 LAB — CBC WITH DIFFERENTIAL/PLATELET
Basophils Absolute: 0.1 10*3/uL (ref 0.0–0.1)
Basophils Relative: 1.2 % (ref 0.0–3.0)
Eosinophils Absolute: 1 10*3/uL — ABNORMAL HIGH (ref 0.0–0.7)
Eosinophils Relative: 15.7 % — ABNORMAL HIGH (ref 0.0–5.0)
HCT: 42.7 % (ref 39.0–52.0)
Hemoglobin: 14.3 g/dL (ref 13.0–17.0)
Lymphocytes Relative: 24 % (ref 12.0–46.0)
Lymphs Abs: 1.5 10*3/uL (ref 0.7–4.0)
MCHC: 33.6 g/dL (ref 30.0–36.0)
MCV: 99.7 fl (ref 78.0–100.0)
Monocytes Absolute: 0.6 10*3/uL (ref 0.1–1.0)
Monocytes Relative: 9.2 % (ref 3.0–12.0)
Neutro Abs: 3.2 10*3/uL (ref 1.4–7.7)
Neutrophils Relative %: 49.9 % (ref 43.0–77.0)
Platelets: 236 10*3/uL (ref 150.0–400.0)
RBC: 4.28 Mil/uL (ref 4.22–5.81)
RDW: 13.9 % (ref 11.5–15.5)
WBC: 6.4 10*3/uL (ref 4.0–10.5)

## 2023-07-07 LAB — LIPID PANEL
Cholesterol: 121 mg/dL (ref 0–200)
HDL: 56.9 mg/dL (ref >39.00)
LDL Cholesterol: 50 mg/dL (ref 0–99)
NonHDL: 63.8
Total CHOL/HDL Ratio: 2
Triglycerides: 68 mg/dL (ref 0.0–149.0)
VLDL: 13.6 mg/dL (ref 0.0–40.0)

## 2023-07-07 LAB — VITAMIN D 25 HYDROXY (VIT D DEFICIENCY, FRACTURES): VITD: 35.02 ng/mL (ref 30.00–100.00)

## 2023-07-07 LAB — PSA, MEDICARE: PSA: 1.6 ng/mL (ref 0.10–4.00)

## 2023-07-07 MED ORDER — ATORVASTATIN CALCIUM 40 MG PO TABS: 40.0000 mg | ORAL_TABLET | Freq: Every day | ORAL | 3 refills | Status: AC

## 2023-07-07 NOTE — Progress Notes (Signed)
 Office Note 07/07/2023  CC:  Chief Complaint  Richard [name] presents with   Annual Exam    Pt is fasting.     HPI:  Richard [name] is a 68 y.o. male who is here for annual health maintenance exam and follow-up hypercholesterolemia, diabetes, and adult ADD. Richard [name] feels well. Glucoses almost always in "green zone" on CGM.  He uses his methylphenidate  only occasionally. PMP AWARE reviewed today: most recent rx for methylphenidate  ER 20 mg was filled 12/11/2022, # 30, rx by me. No red flags.  Past Medical History:  Diagnosis Date   ADHD (attention deficit hyperactivity disorder)    Adult ADHD    Allergic rhinitis    Colon polyps 2012 and 2013   All hyperplastic except one adenomatous w/out high grade dysplasia (recall 2023 per Dr. Arvie Latus)   Colostomy in place [facility]) 01/2022   COVID-19 virus infection 09/29/2020   Crohn disease (HCC)    Follows w/ [facility]. s/p left colectomy w/ colostomy in 2023   Elevated transaminase level 2017   Viral hep screening neg.  Suspect fatty liver.   Erectile dysfunction    History of pneumonia    History of retinal tear    11/2022   Hyperlipidemia    Hypertension    hx of   Large bowel perforation (HCC) 02/2022   delayed, s/p colonoscopy w/biopsies (new dx crohn's/inflammatory BD)   Obesity    OSA on CPAP    Severe.  CPAP 10 cm H2O.  Follows [facility].   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 Andy Bannister did surg   Peripheral neuropathy    feet   Pneumonia    several years ago as of 04/29/23   Rectal varices    +rectal bleed while in hosp 2023   Right leg DVT (HCC)    04/12/22 R fem and pop, treated for 3 months with Eliquis    Seborrheic dermatitis of scalp    and face: hydrocort  2% cream bid prn to face, and fluocinonide 0.5% sol'n to scalp bid prn per Dr. Fleurette Humbles (04/2014)   Secondary male hypogonadism 04/2021   Type 2 diabetes mellitus with microalbuminuria (HCC) DM dx-09/27/2015. Microalb 2020   Follows  w/ PCP, Dr. Ardath Bears.   Ventral hernia    large   Wears glasses     Past Surgical History:  Procedure Laterality Date   COLECTOMY Left 01/2022   COLONOSCOPY  2025   COLONOSCOPY W/ POLYPECTOMY  04/2010; 10/2011   Initial screening TCS showed 6 polyps (one adenomatous).  Pt says he returned for repeat TCS 10/2011 showed one hyperplastic polyp: Recall 10 yrs per Dr. Arvie Latus   EYE SURGERY     Laser surg L eye for retinal tear 11/16/22   FLEXIBLE SIGMOIDOSCOPY N/A 02/25/2022   Procedure: FLEXIBLE SIGMOIDOSCOPY;  Surgeon: Brice Campi Albino Alu., MD;  Location: [facility];  Service: Gastroenterology;  Laterality: N/A;   FLEXIBLE SIGMOIDOSCOPY N/A 03/04/2022   Procedure: FLEXIBLE SIGMOIDOSCOPY;  Surgeon: Annis Kinder, DO;  Location: [facility];  Service: Gastroenterology;  Laterality: N/A;   HEMOSTASIS CONTROL  02/25/2022   Procedure: HEMOSTASIS CONTROL;  Surgeon: Normie Becton., MD;  Location: [facility];  Service: Gastroenterology;;   HEMOSTASIS CONTROL  03/04/2022   Procedure: HEMOSTASIS CONTROL;  Surgeon: Annis Kinder, DO;  Location: [facility];  Service: Gastroenterology;;   INCISION AND DRAINAGE ABSCESS N/A 11/07/2021   Procedure: Incision and drainage of scrotal abscess;  Surgeon: Joyce Nixon, MD;  Location: [facility];  Service: General;  Laterality: N/A;   IR SINUS/FIST TUBE CHK-NON GI  03/06/2022   LAPAROTOMY N/A 02/07/2022   Resection of descending and sigmoid colon with transverse colostomy.Procedure: EXPLORATORY LAPAROTOMY, COLON RESECTION, OSTOMY;  Surgeon: Joyce Nixon, MD;  Location: [facility];  Service: General;  Laterality: N/A;   MOUTH SURGERY     PLACEMENT OF SETON N/A 05/02/2023   Procedure: PLACEMENT OF SETON incison ad drainage of perirectal abcess;  Surgeon: Joyce Nixon, MD;  Location: [facility] [place];  Service: General;  Laterality: N/A;   RECTAL EXAM UNDER ANESTHESIA N/A 05/02/2023   Procedure: RECTAL EXAM UNDER ANESTHESIA  INTERROGATION OF ANAL FISTULA;  Surgeon: Joyce Nixon, MD;  Location: [facility];  Service: General;  Laterality: N/A;   TRANSTHORACIC ECHOCARDIOGRAM     03/05/22 NORMAL, EF 60 - 65%    Family History  Problem Relation Age of Onset   Lung cancer Father    Cancer Father        nasal   Allergies Father    Colon cancer Neg Hx    Esophageal cancer Neg Hx    Rectal cancer Neg Hx    Stomach cancer Neg Hx     Social History   Socioeconomic History   Marital status: Married    Spouse name: Not on file   Number of children: Not on file   Years of education: Not on file   Highest education level: Bachelor's degree (e.g., BA, AB, BS)  Occupational History   Occupation: Special educational needs teacher  Tobacco Use   Smoking status: Former    Current packs/day: 0.00    Types: Cigarettes    Quit date: 07/17/2002    Years since quitting: 20.9   Smokeless tobacco: Never  Vaping Use   Vaping status: Never Used  Substance and Sexual Activity   Alcohol use: Not Currently    Comment: about 1 drink per month   Drug use: No   Sexual activity: Yes    Birth control/protection: None  Other Topics Concern   Not on file  Social History Narrative   Married, 1 child--grown.   Occupation: Child psychotherapist"   Tobacco: 20 pack-yr hx; quit 2004.   Alcohol: nightly cocktail   Drugs: none   Exercise: "walk some when I can".   Diet: normal.   Right Handed. Used to be left handed   Three story home   Drinks a lot of tea and occasional coffee   Social Drivers of Corporate investment banker Strain: Low Risk  (03/02/2023)   Overall Financial Resource Strain (CARDIA)    Difficulty of Paying Living Expenses: Not hard at all  Food Insecurity: Low Risk  (04/24/2023)   Received from [facility]   Hunger Vital Sign    Worried About Running Out of Food in the Last Year: Never true    Ran Out of Food in the Last Year: Never true  Transportation Needs: No Transportation Needs (04/24/2023)    Received from [facility]    In the past 12 months, has lack of reliable transportation kept you from medical appointments, meetings, work or from getting things needed for daily living? : No  Physical Activity: Unknown (03/02/2023)   Exercise Vital Sign    Days of Exercise per Week: Richard [name] declined    Minutes of Exercise per Session: Not on file  Stress: No Stress Concern Present (03/02/2023)   [employer] of Occupational Health - Occupational Stress Questionnaire    Feeling of Stress : Not  at all  Recent Concern: Stress - Stress Concern Present (01/29/2023)   [employer] of Occupational Health - Occupational Stress Questionnaire    Feeling of Stress : To some extent  Social Connections: Unknown (03/02/2023)   Social Connection and Isolation Panel [NHANES]    Frequency of Communication with Friends and Family: Richard [name] declined    Frequency of Social Gatherings with Friends and Family: Richard [name] declined    Attends Religious Services: Richard [name] declined    Database administrator or Organizations: Richard [name] declined    Attends Banker Meetings: Not on file    Marital Status: Married  Recent Concern: Social Connections - Moderately Isolated (01/29/2023)   Social Connection and Isolation Panel [NHANES]    Frequency of Communication with Friends and Family: Three times a week    Frequency of Social Gatherings with Friends and Family: Once a week    Attends Religious Services: Never    Database administrator or Organizations: No    Attends Engineer, structural: Not on file    Marital Status: Married  Catering manager Violence: Not At Risk (02/08/2022)   Humiliation, Afraid, Rape, and Kick questionnaire    Fear of Current or Ex-Partner: No    Emotionally Abused: No    Physically Abused: No    Sexually Abused: No    Outpatient Medications Prior to Visit  Medication Sig Dispense Refill   Accu-Chek Softclix Lancets lancets SMARTSIG:Topical  200 each 5   acetaminophen  (TYLENOL ) 325 MG tablet Take 2 tablets (650 mg total) by mouth every 4 (four) hours as needed for mild pain, moderate pain, fever or headache.     ascorbic acid  (VITAMIN C ) 500 MG tablet Take 1 tablet (500 mg total) by mouth 2 (two) times daily.     aspirin EC 81 MG tablet Take 81 mg by mouth daily. Swallow whole.     Continuous Blood Gluc Receiver (FREESTYLE LIBRE 3 READER) DEVI 1 each by Does not apply route every 14 (fourteen) days. 2 each 2   Continuous Glucose Sensor (FREESTYLE LIBRE 3 SENSOR) MISC Place 1 sensor on the skin every 14 days. Use to check glucose continuously 2 each 2   ferrous sulfate  325 (65 FE) MG EC tablet Take 325 mg by mouth 3 (three) times daily with meals.     glucose blood (ACCU-CHEK GUIDE) test strip 1 each by Other route 4 (four) times daily -  before meals and at bedtime. 300 strip 5   HYDROcodone -acetaminophen  (NORCO/VICODIN) 5-325 MG tablet Take 1 tablet by mouth every 6 (six) hours as needed for moderate pain (pain score 4-6). 30 tablet 0   magnesium  oxide (MAG-OX) 400 (240 Mg) MG tablet TAKE 2 TABLETS (800 MG TOTAL) BY MOUTH 3 (THREE) TIMES DAILY. (Richard [name] taking differently: Take 800 mg by mouth 2 (two) times daily.) 180 tablet 1   metFORMIN  (GLUCOPHAGE ) 1000 MG tablet TAKE 1 TABLET (1,000 MG TOTAL) BY MOUTH TWICE A DAY WITH FOOD (Richard [name] taking differently: 500 mg 2 (two) times daily with a meal.) 180 tablet 3   methylphenidate  (METADATE  ER) 20 MG ER tablet Take 1 tablet (20 mg total) by mouth daily. 30 tablet 0   Multiple Vitamin (MULTIVITAMIN WITH MINERALS) TABS tablet Take 1 tablet by mouth daily.     Risankizumab-rzaa (SKYRIZI) 360 MG/2.4ML SOCT Inject into the skin. Every eight weeks     sildenafil  (VIAGRA ) 100 MG tablet Take 1 tablet (100 mg total) by mouth daily as needed for erectile dysfunction. 30  tablet 6   thiamine  (VITAMIN B-1) 100 MG tablet Take 1 tablet (100 mg total) by mouth daily.     zinc  sulfate 220 (50 Zn) MG capsule  Take 1 capsule (220 mg total) by mouth daily. 30 capsule 0   amoxicillin -clavulanate (AUGMENTIN ) 875-125 MG tablet Take 1 tablet by mouth 2 (two) times daily. (Richard [name] not taking: Reported on 06/04/2023) 20 tablet 0   atorvastatin  (LIPITOR) 40 MG tablet TAKE 1 TABLET BY MOUTH EVERY DAY 90 tablet 0   HYDROcodone  bit-homatropine (HYCODAN) 5-1.5 MG/5ML syrup 1-2 tsp po bid prn cough 120 mL 0   No facility-administered medications prior to visit.    Allergies  Allergen Reactions   Lobster [Shellfish Allergy] Nausea Only   Poison Ivy Extract Itching    Review of Systems  Constitutional:  Negative for appetite change, chills, fatigue and fever.  HENT:  Negative for congestion, dental problem, ear pain and sore throat.   Eyes:  Negative for discharge, redness and visual disturbance.  Respiratory:  Negative for cough, chest tightness, shortness of breath and wheezing.   Cardiovascular:  Negative for chest pain, palpitations and leg swelling.  Gastrointestinal:  Negative for abdominal pain, blood in stool, diarrhea, nausea and vomiting.  Genitourinary:  Negative for difficulty urinating, dysuria, flank pain, frequency, hematuria and urgency.  Musculoskeletal:  Negative for arthralgias, back pain, joint swelling, myalgias and neck stiffness.  Skin:  Negative for pallor and rash.  Neurological:  Negative for dizziness, speech difficulty, weakness and headaches.  Hematological:  Negative for adenopathy. Does not bruise/bleed easily.  Psychiatric/Behavioral:  Negative for confusion and sleep disturbance. The Richard [name] is not nervous/anxious.     PE;    07/07/2023   10:48 AM 06/04/2023   10:03 AM 05/08/2023    1:17 PM  Vitals with BMI  Height 5\' 10"  5\' 10"  5\' 10"   Weight 208 lbs 205 lbs 13 oz 200 lbs 6 oz  BMI 29.84 29.53 28.75  Systolic 124 138 161  Diastolic 84 82 66  Pulse 79 77 91   Gen: Alert, well appearing.  Richard [name] is oriented to person, place, time, and situation. AFFECT: pleasant,  lucid thought and speech. ENT: Ears: EACs clear, normal epithelium.  TMs with good light reflex and landmarks bilaterally.  Eyes: no injection, icteris, swelling, or exudate.  EOMI, PERRLA. Nose: no drainage or turbinate edema/swelling.  No injection or focal lesion.  Mouth: lips without lesion/swelling.  Oral mucosa pink and moist.  Dentition intact and without obvious caries or gingival swelling.  Oropharynx without erythema, exudate, or swelling.  Neck: supple/nontender.  No LAD, mass, or TM.  Carotid pulses 2+ bilaterally, without bruits. CV: RRR, no m/r/g.   LUNGS: CTA bilat, nonlabored resps, good aeration in all lung fields. ABD: soft, NT, ND, BS normal.  No hepatospenomegaly or mass.  No bruits. EXT: no clubbing, cyanosis, or edema.  Musculoskeletal: no joint swelling, erythema, warmth, or tenderness.  ROM of all joints intact. Skin - no sores or suspicious lesions or rashes or color changes   Pertinent labs:  Lab Results  Component Value Date   TSH 2.58 03/10/2019   Lab Results  Component Value Date   WBC 10.2 01/29/2023   HGB 15.3 05/02/2023   HCT 45.0 05/02/2023   MCV 98.6 01/29/2023   PLT 227.0 01/29/2023   Lab Results  Component Value Date   CREATININE 0.40 (L) 05/02/2023   BUN 10 05/02/2023   NA 141 05/02/2023   K 3.8 05/02/2023   CL 105  05/02/2023   CO2 27 01/29/2023   Lab Results  Component Value Date   ALT 23 11/28/2022   AST 18 11/28/2022   ALKPHOS 114 11/28/2022   BILITOT 0.4 06/11/2022   Lab Results  Component Value Date   CHOL 109 07/12/2021   Lab Results  Component Value Date   HDL 54.60 07/12/2021   Lab Results  Component Value Date   LDLCALC 34 07/12/2021   Lab Results  Component Value Date   TRIG 144 02/21/2022   Lab Results  Component Value Date   CHOLHDL 2 07/12/2021   Lab Results  Component Value Date   PSA 0.36 07/12/2021   PSA 0.25 07/11/2020   PSA 0.2 03/10/2019   Lab Results  Component Value Date   HGBA1C 5.7 (A)  07/07/2023   HGBA1C 5.7 07/07/2023   HGBA1C 5.7 07/07/2023   HGBA1C 5.7 07/07/2023   ASSESSMENT AND PLAN:   #1 health maintenance exam: Reviewed age and gender appropriate health maintenance issues (prudent diet, regular exercise, health risks of tobacco and excessive alcohol, use of seatbelts, fire alarms in home, use of sunscreen).  Also reviewed age and gender appropriate health screening as well as vaccine recommendations. Vaccines:ALL UTD Labs: lipids,cbc,cmet,hba1c,psa Prostate ca screening: PSA Colon ca screening: N/A.  History of colectomy for Crohn's November 2023.  #2 diabetes without complication.  Well-controlled on metformin  1000 mg twice a day. POC Hba1C TODAY 5.7%.  #3 hypercholesterolemia, doing well on atorvastatin  40 mg a day. Lipid panel and hepatic panel today.  #4 adult ADD. He has done well long-term with use of Metadate  ER 20 mg on an as-needed basis. A new prescription was not needed today.  #5 Crohn's disease, in remission on Skyrizi.  History of sigmoidectomy. He has a colostomy. He has significant ventral hernia and small parastomal hernia which give him quite a bit of discomfort. He is strongly considering getting hernia repair with mesh and colostomy takedown.  If he does this it will be late this summer.  An After Visit Summary was printed and given to the Richard [name].  FOLLOW UP:  Return in about 3 months (around 10/06/2023) for routine chronic illness f/u.  Signed:  Arletha Lady, MD           07/07/2023

## 2023-07-07 NOTE — Addendum Note (Signed)
 Addended by: Shelvia Dick on: 07/07/2023 12:00 PM   Modules accepted: Orders

## 2023-07-09 ENCOUNTER — Other Ambulatory Visit: Payer: Self-pay | Admitting: Family Medicine

## 2023-07-09 ENCOUNTER — Encounter: Payer: Self-pay | Admitting: Family Medicine

## 2023-07-09 DIAGNOSIS — K50118 Crohn's disease of large intestine with other complication: Secondary | ICD-10-CM

## 2023-07-09 DIAGNOSIS — D721 Eosinophilia, unspecified: Secondary | ICD-10-CM

## 2023-07-25 ENCOUNTER — Encounter: Payer: Self-pay | Admitting: Family Medicine

## 2023-07-25 DIAGNOSIS — T8189XA Other complications of procedures, not elsewhere classified, initial encounter: Secondary | ICD-10-CM

## 2023-07-25 DIAGNOSIS — L308 Other specified dermatitis: Secondary | ICD-10-CM

## 2023-07-25 DIAGNOSIS — K469 Unspecified abdominal hernia without obstruction or gangrene: Secondary | ICD-10-CM

## 2023-07-25 NOTE — Telephone Encounter (Signed)
 Britt: Please place a referral to the wound clinic with Maryan Smalling. Diagnoses are attached to this encounter. thx

## 2023-07-28 NOTE — Telephone Encounter (Signed)
 No further action needed.

## 2023-08-06 ENCOUNTER — Other Ambulatory Visit (INDEPENDENT_AMBULATORY_CARE_PROVIDER_SITE_OTHER)

## 2023-08-06 DIAGNOSIS — K50118 Crohn's disease of large intestine with other complication: Secondary | ICD-10-CM

## 2023-08-06 DIAGNOSIS — D721 Eosinophilia, unspecified: Secondary | ICD-10-CM | POA: Diagnosis not present

## 2023-08-06 LAB — CBC WITH DIFFERENTIAL/PLATELET
Basophils Absolute: 0 10*3/uL (ref 0.0–0.1)
Basophils Relative: 0.6 % (ref 0.0–3.0)
Eosinophils Absolute: 0.9 10*3/uL — ABNORMAL HIGH (ref 0.0–0.7)
Eosinophils Relative: 11.6 % — ABNORMAL HIGH (ref 0.0–5.0)
HCT: 41.6 % (ref 39.0–52.0)
Hemoglobin: 14.1 g/dL (ref 13.0–17.0)
Lymphocytes Relative: 22.7 % (ref 12.0–46.0)
Lymphs Abs: 1.8 10*3/uL (ref 0.7–4.0)
MCHC: 33.8 g/dL (ref 30.0–36.0)
MCV: 96.7 fl (ref 78.0–100.0)
Monocytes Absolute: 0.7 10*3/uL (ref 0.1–1.0)
Monocytes Relative: 8.4 % (ref 3.0–12.0)
Neutro Abs: 4.4 10*3/uL (ref 1.4–7.7)
Neutrophils Relative %: 56.7 % (ref 43.0–77.0)
Platelets: 218 10*3/uL (ref 150.0–400.0)
RBC: 4.3 Mil/uL (ref 4.22–5.81)
RDW: 13.5 % (ref 11.5–15.5)
WBC: 7.8 10*3/uL (ref 4.0–10.5)

## 2023-08-07 ENCOUNTER — Ambulatory Visit: Payer: Self-pay | Admitting: Family Medicine

## 2023-08-08 ENCOUNTER — Encounter: Payer: Self-pay | Admitting: Family Medicine

## 2023-08-15 ENCOUNTER — Encounter: Payer: Self-pay | Admitting: Family Medicine

## 2023-08-15 ENCOUNTER — Ambulatory Visit (INDEPENDENT_AMBULATORY_CARE_PROVIDER_SITE_OTHER): Admitting: Family Medicine

## 2023-08-15 VITALS — BP 111/68 | HR 72 | Temp 98.7°F | Ht 70.0 in | Wt 211.4 lb

## 2023-08-15 DIAGNOSIS — D721 Eosinophilia, unspecified: Secondary | ICD-10-CM

## 2023-08-15 DIAGNOSIS — L03114 Cellulitis of left upper limb: Secondary | ICD-10-CM

## 2023-08-15 LAB — CBC WITH DIFFERENTIAL/PLATELET
Basophils Absolute: 0.1 10*3/uL (ref 0.0–0.1)
Basophils Relative: 1 % (ref 0.0–3.0)
Eosinophils Absolute: 1.1 10*3/uL — ABNORMAL HIGH (ref 0.0–0.7)
Eosinophils Relative: 12.8 % — ABNORMAL HIGH (ref 0.0–5.0)
HCT: 42.5 % (ref 39.0–52.0)
Hemoglobin: 14.2 g/dL (ref 13.0–17.0)
Lymphocytes Relative: 19.6 % (ref 12.0–46.0)
Lymphs Abs: 1.7 10*3/uL (ref 0.7–4.0)
MCHC: 33.5 g/dL (ref 30.0–36.0)
MCV: 97 fl (ref 78.0–100.0)
Monocytes Absolute: 0.6 10*3/uL (ref 0.1–1.0)
Monocytes Relative: 7.4 % (ref 3.0–12.0)
Neutro Abs: 5.1 10*3/uL (ref 1.4–7.7)
Neutrophils Relative %: 59.2 % (ref 43.0–77.0)
Platelets: 247 10*3/uL (ref 150.0–400.0)
RBC: 4.38 Mil/uL (ref 4.22–5.81)
RDW: 13.5 % (ref 11.5–15.5)
WBC: 8.6 10*3/uL (ref 4.0–10.5)

## 2023-08-15 MED ORDER — DOXYCYCLINE HYCLATE 100 MG PO CAPS: 100.0000 mg | ORAL_CAPSULE | Freq: Two times a day (BID) | ORAL | 0 refills | Status: DC

## 2023-08-15 MED ORDER — AMOXICILLIN-POT CLAVULANATE 875-125 MG PO TABS: 1.0000 | ORAL_TABLET | Freq: Two times a day (BID) | ORAL | 0 refills | Status: DC

## 2023-08-15 NOTE — Progress Notes (Signed)
 OFFICE VISIT  08/15/2023  CC:  Chief Complaint  Patient presents with   Arm Concern    Left arm with large swollen, pt's wife states it is more warm and red than yesterday    Patient is a 68 y.o. male who presents accompanied by his wife for concern of possible arm infection.  INTERIM HX: He has had progressive focal swelling and redness in the left forearm for the last 1 week.  No fever or malaise. No known trauma.  He has full range of motion of the elbow and no swelling of the elbow joint and no swelling over the olecranon.  Past Medical History:  Diagnosis Date   Adult ADHD    Allergic rhinitis    Colon polyps 2012 and 2013   All hyperplastic except one adenomatous w/out high grade dysplasia (recall 2023 per Dr. Arvie Latus)   Colostomy in place [facility]) 01/2022   COVID-19 virus infection 09/29/2020   Crohn disease (HCC)    Follows w/ [facility]. s/p left colectomy w/ colostomy in 2023   Elevated transaminase level 2017   Viral hep screening neg.  Suspect fatty liver.   Erectile dysfunction    History of pneumonia    History of retinal tear    11/2022   Hyperlipidemia    Hypertension    hx of   Large bowel perforation (HCC) 02/2022   delayed, s/p colonoscopy w/biopsies (new dx crohn's/inflammatory BD)   Obesity    OSA on CPAP    Severe.  CPAP 10 cm H2O.  Follows [facility].   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 Andy Bannister did surg   Peripheral neuropathy    feet   Pneumonia    several years ago as of 04/29/23   Rectal varices    +rectal bleed while in hosp 2023   Right leg DVT (HCC)    04/12/22 R fem and pop, treated for 3 months with Eliquis    Seborrheic dermatitis of scalp    and face: hydrocort  2% cream bid prn to face, and fluocinonide 0.5% sol'n to scalp bid prn per Dr. Fleurette Humbles (04/2014)   Secondary male hypogonadism 04/2021   Type 2 diabetes mellitus with microalbuminuria (HCC) DM dx-09/27/2015. Microalb 2020   Follows w/  PCP, Dr. Ardath Bears.   Ventral hernia    large   Wears glasses     Past Surgical History:  Procedure Laterality Date   COLECTOMY Left 01/2022   COLONOSCOPY  2025   COLONOSCOPY W/ POLYPECTOMY  04/2010; 10/2011   Initial screening TCS showed 6 polyps (one adenomatous).  Pt says he returned for repeat TCS 10/2011 showed one hyperplastic polyp: Recall 10 yrs per Dr. Arvie Latus   EYE SURGERY     Laser surg L eye for retinal tear 11/16/22   FLEXIBLE SIGMOIDOSCOPY N/A 02/25/2022   Procedure: FLEXIBLE SIGMOIDOSCOPY;  Surgeon: Brice Campi Albino Alu., MD;  Location: [facility];  Service: Gastroenterology;  Laterality: N/A;   FLEXIBLE SIGMOIDOSCOPY N/A 03/04/2022   Procedure: FLEXIBLE SIGMOIDOSCOPY;  Surgeon: Annis Kinder, DO;  Location: [facility];  Service: Gastroenterology;  Laterality: N/A;   HEMOSTASIS CONTROL  02/25/2022   Procedure: HEMOSTASIS CONTROL;  Surgeon: Normie Becton., MD;  Location: [facility];  Service: Gastroenterology;;   HEMOSTASIS CONTROL  03/04/2022   Procedure: HEMOSTASIS CONTROL;  Surgeon: Annis Kinder, DO;  Location: [facility];  Service: Gastroenterology;;   INCISION AND DRAINAGE ABSCESS N/A 11/07/2021   Procedure: Incision and drainage of scrotal abscess;  Surgeon: Andy Bannister,  Evalyn Hillier, MD;  Location: [facility];  Service: General;  Laterality: N/A;   IR SINUS/FIST TUBE CHK-NON GI  03/06/2022   LAPAROTOMY N/A 02/07/2022   Resection of descending and sigmoid colon with transverse colostomy.Procedure: EXPLORATORY LAPAROTOMY, COLON RESECTION, OSTOMY;  Surgeon: Joyce Nixon, MD;  Location: [facility];  Service: General;  Laterality: N/A;   MOUTH SURGERY     PLACEMENT OF SETON N/A 05/02/2023   Procedure: PLACEMENT OF SETON incison ad drainage of perirectal abcess;  Surgeon: Joyce Nixon, MD;  Location: [facility] [place];  Service: General;  Laterality: N/A;   RECTAL EXAM UNDER ANESTHESIA N/A 05/02/2023   Procedure: RECTAL EXAM UNDER ANESTHESIA  INTERROGATION OF ANAL FISTULA;  Surgeon: Joyce Nixon, MD;  Location: [facility] [place];  Service: General;  Laterality: N/A;   TRANSTHORACIC ECHOCARDIOGRAM     03/05/22 NORMAL, EF 60 - 65%    Outpatient Medications Prior to Visit  Medication Sig Dispense Refill   Accu-Chek Softclix Lancets lancets SMARTSIG:Topical 200 each 5   acetaminophen  (TYLENOL ) 325 MG tablet Take 2 tablets (650 mg total) by mouth every 4 (four) hours as needed for mild pain, moderate pain, fever or headache.     ascorbic acid  (VITAMIN C ) 500 MG tablet Take 1 tablet (500 mg total) by mouth 2 (two) times daily.     aspirin EC 81 MG tablet Take 81 mg by mouth daily. Swallow whole.     atorvastatin  (LIPITOR) 40 MG tablet Take 1 tablet (40 mg total) by mouth daily. 90 tablet 3   Continuous Blood Gluc Receiver (FREESTYLE LIBRE 3 READER) DEVI 1 each by Does not apply route every 14 (fourteen) days. 2 each 2   Continuous Glucose Sensor (FREESTYLE LIBRE 3 SENSOR) MISC Place 1 sensor on the skin every 14 days. Use to check glucose continuously 2 each 2   ferrous sulfate  325 (65 FE) MG EC tablet Take 325 mg by mouth 3 (three) times daily with meals.     glucose blood (ACCU-CHEK GUIDE) test strip 1 each by Other route 4 (four) times daily -  before meals and at bedtime. 300 strip 5   HYDROcodone -acetaminophen  (NORCO/VICODIN) 5-325 MG tablet Take 1 tablet by mouth every 6 (six) hours as needed for moderate pain (pain score 4-6). 30 tablet 0   magnesium  oxide (MAG-OX) 400 (240 Mg) MG tablet TAKE 2 TABLETS (800 MG TOTAL) BY MOUTH 3 (THREE) TIMES DAILY. (Patient taking differently: Take 800 mg by mouth 2 (two) times daily.) 180 tablet 1   metFORMIN  (GLUCOPHAGE ) 1000 MG tablet TAKE 1 TABLET (1,000 MG TOTAL) BY MOUTH TWICE A DAY WITH FOOD (Patient taking differently: 500 mg 2 (two) times daily with a meal.) 180 tablet 3   methylphenidate  (METADATE  ER) 20 MG ER tablet Take 1 tablet (20 mg total) by mouth daily. 30 tablet 0    Multiple Vitamin (MULTIVITAMIN WITH MINERALS) TABS tablet Take 1 tablet by mouth daily.     Risankizumab-rzaa (SKYRIZI) 360 MG/2.4ML SOCT Inject into the skin. Every eight weeks     sildenafil  (VIAGRA ) 100 MG tablet Take 1 tablet (100 mg total) by mouth daily as needed for erectile dysfunction. 30 tablet 6   thiamine  (VITAMIN B-1) 100 MG tablet Take 1 tablet (100 mg total) by mouth daily.     zinc  sulfate 220 (50 Zn) MG capsule Take 1 capsule (220 mg total) by mouth daily. 30 capsule 0   No facility-administered medications prior to visit.    Allergies  Allergen Reactions  Lobster [Shellfish Allergy] Nausea Only   Poison Ivy Extract Itching    Review of Systems As per HPI  PE:    08/15/2023   10:59 AM 07/07/2023   10:48 AM 06/04/2023   10:03 AM  Vitals with BMI  Height 5\' 10"  5\' 10"  5\' 10"   Weight 211 lbs 6 oz 208 lbs 205 lbs 13 oz  BMI 30.33 29.84 29.53  Systolic 111 124 045  Diastolic 68 84 82  Pulse 72 79 77     Physical Exam  Gen: Alert, well appearing.  Patient is oriented to person, place, time, and situation. AFFECT: pleasant, lucid thought and speech. Proximal forearm (distal to the olecranon) with approximately 5 cm x 5 cm focal firm subcu swelling with overlying erythema and a mild amount of tenderness to palpation.  No fluctuance or streaking.  Elbow range of motion fully intact. Bedside ultrasound today showed subcutaneous edema with diffuse hyperemia on PDI.  There was no extension into the forearm musculature and there was no anechoic or hypoechoic pocket of fluid to suggest abscess or hematoma.  LABS:  Last CBC Lab Results  Component Value Date   WBC 7.8 08/06/2023   HGB 14.1 08/06/2023   HCT 41.6 08/06/2023   MCV 96.7 08/06/2023   MCH 30.4 05/24/2022   RDW 13.5 08/06/2023   PLT 218.0 08/06/2023   Lab Results  Component Value Date   IRON 26 (L) 03/28/2022   TIBC 197 (L) 03/28/2022   FERRITIN 114 03/28/2022   Last metabolic panel Lab Results   Component Value Date   GLUCOSE 122 (H) 07/07/2023   NA 140 07/07/2023   K 4.3 07/07/2023   CL 103 07/07/2023   CO2 28 07/07/2023   BUN 11 07/07/2023   CREATININE 0.51 07/07/2023   GFR 105.10 07/07/2023   CALCIUM  9.7 07/07/2023   PHOS 2.3 (L) 03/01/2022   PROT 7.8 07/07/2023   ALBUMIN  4.4 07/07/2023   BILITOT 0.6 07/07/2023   ALKPHOS 79 07/07/2023   AST 15 07/07/2023   ALT 14 07/07/2023   ANIONGAP 7 04/12/2022   Last hemoglobin A1c Lab Results  Component Value Date   HGBA1C 5.7 (A) 07/07/2023   HGBA1C 5.7 07/07/2023   HGBA1C 5.7 07/07/2023   HGBA1C 5.7 07/07/2023   IMPRESSION AND PLAN:  1) Left forearm abscess/cellulitis. There is no distinct pocket of fluid to drain today. Start doxycycline  and augmentin . Ice daily for comfort. Signs/symptoms to call or return for were reviewed and pt expressed understanding. CBC today.  2) eosinophilia. We have detected this on routine blood work 07/07/2023 and 08/06/2023. Unknown etiology. It has only come on after he got on Skyrizi. He asked his gastroenterologist if Sheilla Der could do this and he was told that it is unlikely but possible. Repeat CBC with differential today.  An After Visit Summary was printed and given to the patient.  FOLLOW UP: Return in about 1 week (around 08/22/2023) for recheck 1 wk.  Signed:  Arletha Lady, MD           08/15/2023

## 2023-08-16 ENCOUNTER — Ambulatory Visit: Payer: Self-pay | Admitting: Family Medicine

## 2023-08-18 NOTE — Telephone Encounter (Signed)
 FYI  Please see below

## 2023-08-22 ENCOUNTER — Ambulatory Visit (INDEPENDENT_AMBULATORY_CARE_PROVIDER_SITE_OTHER): Admitting: Family Medicine

## 2023-08-22 ENCOUNTER — Encounter: Payer: Self-pay | Admitting: Family Medicine

## 2023-08-22 VITALS — BP 113/72 | HR 63 | Temp 98.9°F | Ht 70.0 in | Wt 209.8 lb

## 2023-08-22 DIAGNOSIS — L02414 Cutaneous abscess of left upper limb: Secondary | ICD-10-CM

## 2023-08-22 MED ORDER — AMOXICILLIN-POT CLAVULANATE 875-125 MG PO TABS: 1.0000 | ORAL_TABLET | Freq: Two times a day (BID) | ORAL | 0 refills | Status: AC

## 2023-08-22 MED ORDER — DOXYCYCLINE HYCLATE 100 MG PO CAPS: 100.0000 mg | ORAL_CAPSULE | Freq: Two times a day (BID) | ORAL | 0 refills | Status: AC

## 2023-08-22 NOTE — Progress Notes (Signed)
 OFFICE VISIT  08/22/2023  CC:  Chief Complaint  Patient presents with   Follow-up    1 week f/u cellulitis    Patient is a 68 y.o. male who presents for 1 week follow-up of cellulitis of the left forearm. A/P as of last visit: "1) Left forearm abscess/cellulitis. There is no distinct pocket of fluid to drain today. Start doxycycline  and augmentin . Ice daily for comfort. Signs/symptoms to call or return for were reviewed and pt expressed understanding. CBC today.   2) eosinophilia. We have detected this on routine blood work 07/07/2023 and 08/06/2023. Unknown etiology. It has only come on after he got on Skyrizi. He asked his gastroenterologist if Sheilla Der could do this and he was told that it is unlikely but possible. Repeat CBC with differential today."  INTERIM HX: The arm is much better.  Swelling is less, hardly any redness or discomfort.  He has 3 more days of antibiotics. No adverse effects from the antibiotics.   Past Medical History:  Diagnosis Date   Adult ADHD    Allergic rhinitis    Colon polyps 2012 and 2013   All hyperplastic except one adenomatous w/out high grade dysplasia (recall 2023 per Dr. Arvie Latus)   Colostomy in place [facility]) 01/2022   COVID-19 virus infection 09/29/2020   Crohn disease (HCC)    Follows w/ [facility]. s/p left colectomy w/ colostomy in 2023   Elevated transaminase level 2017   Viral hep screening neg.  Suspect fatty liver.   Erectile dysfunction    History of pneumonia    History of retinal tear    11/2022   Hyperlipidemia    Hypertension    hx of   Large bowel perforation (HCC) 02/2022   delayed, s/p colonoscopy w/biopsies (new dx crohn's/inflammatory BD)   Obesity    OSA on CPAP    Severe.  CPAP 10 cm H2O.  Follows [facility].   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 Andy Bannister did surg   Peripheral neuropathy    feet   Pneumonia    several years ago as of 04/29/23   Rectal varices     +rectal bleed while in hosp 2023   Right leg DVT (HCC)    04/12/22 R fem and pop, treated for 3 months with Eliquis    Seborrheic dermatitis of scalp    and face: hydrocort  2% cream bid prn to face, and fluocinonide 0.5% sol'n to scalp bid prn per Dr. Fleurette Humbles (04/2014)   Secondary male hypogonadism 04/2021   Type 2 diabetes mellitus with microalbuminuria (HCC) DM dx-09/27/2015. Microalb 2020   Follows w/ PCP, Dr. Ardath Bears.   Ventral hernia    large   Wears glasses     Past Surgical History:  Procedure Laterality Date   COLECTOMY Left 01/2022   COLONOSCOPY  2025   COLONOSCOPY W/ POLYPECTOMY  04/2010; 10/2011   Initial screening TCS showed 6 polyps (one adenomatous).  Pt says he returned for repeat TCS 10/2011 showed one hyperplastic polyp: Recall 10 yrs per Dr. Arvie Latus   EYE SURGERY     Laser surg L eye for retinal tear 11/16/22   FLEXIBLE SIGMOIDOSCOPY N/A 02/25/2022   Procedure: FLEXIBLE SIGMOIDOSCOPY;  Surgeon: Brice Campi Albino Alu., MD;  Location: [facility];  Service: Gastroenterology;  Laterality: N/A;   FLEXIBLE SIGMOIDOSCOPY N/A 03/04/2022   Procedure: FLEXIBLE SIGMOIDOSCOPY;  Surgeon: Annis Kinder, DO;  Location: [facility];  Service: Gastroenterology;  Laterality: N/A;   HEMOSTASIS CONTROL  02/25/2022  Procedure: HEMOSTASIS CONTROL;  Surgeon: Normie Becton., MD;  Location: [facility];  Service: Gastroenterology;;   HEMOSTASIS CONTROL  03/04/2022   Procedure: HEMOSTASIS CONTROL;  Surgeon: Annis Kinder, DO;  Location: [facility];  Service: Gastroenterology;;   INCISION AND DRAINAGE ABSCESS N/A 11/07/2021   Procedure: Incision and drainage of scrotal abscess;  Surgeon: Joyce Nixon, MD;  Location: [facility];  Service: General;  Laterality: N/A;   IR SINUS/FIST TUBE CHK-NON GI  03/06/2022   LAPAROTOMY N/A 02/07/2022   Resection of descending and sigmoid colon with transverse colostomy.Procedure: EXPLORATORY LAPAROTOMY, COLON RESECTION, OSTOMY;   Surgeon: Joyce Nixon, MD;  Location: [facility];  Service: General;  Laterality: N/A;   MOUTH SURGERY     PLACEMENT OF SETON N/A 05/02/2023   Procedure: PLACEMENT OF SETON incison ad drainage of perirectal abcess;  Surgeon: Joyce Nixon, MD;  Location: [facility] [place];  Service: General;  Laterality: N/A;   RECTAL EXAM UNDER ANESTHESIA N/A 05/02/2023   Procedure: RECTAL EXAM UNDER ANESTHESIA INTERROGATION OF ANAL FISTULA;  Surgeon: Joyce Nixon, MD;  Location: [facility] [place];  Service: General;  Laterality: N/A;   TRANSTHORACIC ECHOCARDIOGRAM     03/05/22 NORMAL, EF 60 - 65%    Outpatient Medications Prior to Visit  Medication Sig Dispense Refill   Accu-Chek Softclix Lancets lancets SMARTSIG:Topical 200 each 5   acetaminophen  (TYLENOL ) 325 MG tablet Take 2 tablets (650 mg total) by mouth every 4 (four) hours as needed for mild pain, moderate pain, fever or headache.     ascorbic acid  (VITAMIN C ) 500 MG tablet Take 1 tablet (500 mg total) by mouth 2 (two) times daily.     aspirin EC 81 MG tablet Take 81 mg by mouth daily. Swallow whole.     atorvastatin  (LIPITOR) 40 MG tablet Take 1 tablet (40 mg total) by mouth daily. 90 tablet 3   Continuous Blood Gluc Receiver (FREESTYLE LIBRE 3 READER) DEVI 1 each by Does not apply route every 14 (fourteen) days. 2 each 2   Continuous Glucose Sensor (FREESTYLE LIBRE 3 SENSOR) MISC Place 1 sensor on the skin every 14 days. Use to check glucose continuously 2 each 2   ferrous sulfate  325 (65 FE) MG EC tablet Take 325 mg by mouth 3 (three) times daily with meals.     glucose blood (ACCU-CHEK GUIDE) test strip 1 each by Other route 4 (four) times daily -  before meals and at bedtime. 300 strip 5   HYDROcodone -acetaminophen  (NORCO/VICODIN) 5-325 MG tablet Take 1 tablet by mouth every 6 (six) hours as needed for moderate pain (pain score 4-6). 30 tablet 0   magnesium  oxide (MAG-OX) 400 (240 Mg) MG tablet TAKE 2 TABLETS (800 MG TOTAL) BY  MOUTH 3 (THREE) TIMES DAILY. (Patient taking differently: Take 800 mg by mouth 2 (two) times daily.) 180 tablet 1   metFORMIN  (GLUCOPHAGE ) 1000 MG tablet TAKE 1 TABLET (1,000 MG TOTAL) BY MOUTH TWICE A DAY WITH FOOD (Patient taking differently: 500 mg 2 (two) times daily with a meal.) 180 tablet 3   methylphenidate  (METADATE  ER) 20 MG ER tablet Take 1 tablet (20 mg total) by mouth daily. 30 tablet 0   Multiple Vitamin (MULTIVITAMIN WITH MINERALS) TABS tablet Take 1 tablet by mouth daily.     Risankizumab-rzaa (SKYRIZI) 360 MG/2.4ML SOCT Inject into the skin. Every eight weeks     sildenafil  (VIAGRA ) 100 MG tablet Take 1 tablet (100 mg total) by mouth daily as needed for erectile dysfunction.  30 tablet 6   thiamine  (VITAMIN B-1) 100 MG tablet Take 1 tablet (100 mg total) by mouth daily.     zinc  sulfate 220 (50 Zn) MG capsule Take 1 capsule (220 mg total) by mouth daily. 30 capsule 0   amoxicillin -clavulanate (AUGMENTIN ) 875-125 MG tablet Take 1 tablet by mouth 2 (two) times daily. 20 tablet 0   doxycycline  (VIBRAMYCIN ) 100 MG capsule Take 1 capsule (100 mg total) by mouth 2 (two) times daily for 10 days. 20 capsule 0   No facility-administered medications prior to visit.    Allergies  Allergen Reactions   Lobster [Shellfish Allergy] Nausea Only   Poison Ivy Extract Itching    Review of Systems As per HPI  PE:    08/22/2023    1:50 PM 08/15/2023   10:59 AM 07/07/2023   10:48 AM  Vitals with BMI  Height 5\' 10"  5\' 10"  5\' 10"   Weight 209 lbs 13 oz 211 lbs 6 oz 208 lbs  BMI 30.1 30.33 29.84  Systolic 113 111 161  Diastolic 72 68 84  Pulse 63 72 79     Physical Exam  Gen: Alert, well appearing.  Patient is oriented to person, place, time, and situation. AFFECT: pleasant, lucid thought and speech.   LABS:  Last CBC Lab Results  Component Value Date   WBC 8.6 08/15/2023   HGB 14.2 08/15/2023   HCT 42.5 08/15/2023   MCV 97.0 08/15/2023   MCH 30.4 05/24/2022   RDW 13.5  08/15/2023   PLT 247.0 08/15/2023   Last metabolic panel Lab Results  Component Value Date   GLUCOSE 122 (H) 07/07/2023   NA 140 07/07/2023   K 4.3 07/07/2023   CL 103 07/07/2023   CO2 28 07/07/2023   BUN 11 07/07/2023   CREATININE 0.51 07/07/2023   GFR 105.10 07/07/2023   CALCIUM  9.7 07/07/2023   PHOS 2.3 (L) 03/01/2022   PROT 7.8 07/07/2023   ALBUMIN  4.4 07/07/2023   BILITOT 0.6 07/07/2023   ALKPHOS 79 07/07/2023   AST 15 07/07/2023   ALT 14 07/07/2023   ANIONGAP 7 04/12/2022   IMPRESSION AND PLAN:  Left forearm infection, improving.  He will take the remaining 3 days of Augmentin  and doxycycline  and I will extend this for 7 more days after that--> prescriptions sent. Signs/symptoms to call or return for were reviewed and pt expressed understanding.  An After Visit Summary was printed and given to the patient.  FOLLOW UP: Return if symptoms worsen or fail to improve.  Signed:  Arletha Lady, MD           08/22/2023

## 2023-09-15 ENCOUNTER — Encounter (HOSPITAL_BASED_OUTPATIENT_CLINIC_OR_DEPARTMENT_OTHER): Attending: Internal Medicine | Admitting: Internal Medicine

## 2023-09-15 DIAGNOSIS — Z7984 Long term (current) use of oral hypoglycemic drugs: Secondary | ICD-10-CM | POA: Insufficient documentation

## 2023-09-15 DIAGNOSIS — K631 Perforation of intestine (nontraumatic): Secondary | ICD-10-CM | POA: Diagnosis not present

## 2023-09-15 DIAGNOSIS — Z9049 Acquired absence of other specified parts of digestive tract: Secondary | ICD-10-CM | POA: Insufficient documentation

## 2023-09-15 DIAGNOSIS — X58XXXA Exposure to other specified factors, initial encounter: Secondary | ICD-10-CM | POA: Diagnosis not present

## 2023-09-15 DIAGNOSIS — E11622 Type 2 diabetes mellitus with other skin ulcer: Secondary | ICD-10-CM | POA: Diagnosis not present

## 2023-09-15 DIAGNOSIS — S31105A Unspecified open wound of abdominal wall, periumbilic region without penetration into peritoneal cavity, initial encounter: Secondary | ICD-10-CM

## 2023-09-15 DIAGNOSIS — L309 Dermatitis, unspecified: Secondary | ICD-10-CM

## 2023-09-15 DIAGNOSIS — Z933 Colostomy status: Secondary | ICD-10-CM | POA: Diagnosis not present

## 2023-09-15 DIAGNOSIS — K50918 Crohn's disease, unspecified, with other complication: Secondary | ICD-10-CM | POA: Diagnosis not present

## 2023-09-24 ENCOUNTER — Encounter (HOSPITAL_BASED_OUTPATIENT_CLINIC_OR_DEPARTMENT_OTHER): Attending: Internal Medicine | Admitting: Internal Medicine

## 2023-09-24 DIAGNOSIS — K50918 Crohn's disease, unspecified, with other complication: Secondary | ICD-10-CM | POA: Insufficient documentation

## 2023-09-24 DIAGNOSIS — E11622 Type 2 diabetes mellitus with other skin ulcer: Secondary | ICD-10-CM | POA: Diagnosis not present

## 2023-09-24 DIAGNOSIS — S31105A Unspecified open wound of abdominal wall, periumbilic region without penetration into peritoneal cavity, initial encounter: Secondary | ICD-10-CM | POA: Diagnosis present

## 2023-09-24 DIAGNOSIS — K631 Perforation of intestine (nontraumatic): Secondary | ICD-10-CM | POA: Insufficient documentation

## 2023-09-24 DIAGNOSIS — L731 Pseudofolliculitis barbae: Secondary | ICD-10-CM | POA: Diagnosis not present

## 2023-09-24 DIAGNOSIS — L309 Dermatitis, unspecified: Secondary | ICD-10-CM

## 2023-09-24 DIAGNOSIS — X58XXXA Exposure to other specified factors, initial encounter: Secondary | ICD-10-CM | POA: Diagnosis not present

## 2023-09-24 DIAGNOSIS — S61205A Unspecified open wound of left ring finger without damage to nail, initial encounter: Secondary | ICD-10-CM

## 2023-10-01 ENCOUNTER — Encounter (HOSPITAL_BASED_OUTPATIENT_CLINIC_OR_DEPARTMENT_OTHER): Admitting: Internal Medicine

## 2023-10-01 DIAGNOSIS — S61205A Unspecified open wound of left ring finger without damage to nail, initial encounter: Secondary | ICD-10-CM

## 2023-10-01 DIAGNOSIS — E11622 Type 2 diabetes mellitus with other skin ulcer: Secondary | ICD-10-CM | POA: Diagnosis not present

## 2023-10-01 DIAGNOSIS — S31105A Unspecified open wound of abdominal wall, periumbilic region without penetration into peritoneal cavity, initial encounter: Secondary | ICD-10-CM | POA: Diagnosis not present

## 2023-10-01 DIAGNOSIS — L309 Dermatitis, unspecified: Secondary | ICD-10-CM | POA: Diagnosis not present

## 2023-10-06 ENCOUNTER — Ambulatory Visit: Admitting: Family Medicine

## 2023-10-15 ENCOUNTER — Ambulatory Visit: Admitting: Family Medicine

## 2023-10-22 ENCOUNTER — Encounter (HOSPITAL_BASED_OUTPATIENT_CLINIC_OR_DEPARTMENT_OTHER): Attending: Internal Medicine | Admitting: Internal Medicine

## 2023-10-22 DIAGNOSIS — E11622 Type 2 diabetes mellitus with other skin ulcer: Secondary | ICD-10-CM | POA: Insufficient documentation

## 2023-10-22 DIAGNOSIS — K50918 Crohn's disease, unspecified, with other complication: Secondary | ICD-10-CM | POA: Diagnosis not present

## 2023-10-22 DIAGNOSIS — L731 Pseudofolliculitis barbae: Secondary | ICD-10-CM | POA: Diagnosis not present

## 2023-10-22 DIAGNOSIS — S31105A Unspecified open wound of abdominal wall, periumbilic region without penetration into peritoneal cavity, initial encounter: Secondary | ICD-10-CM | POA: Insufficient documentation

## 2023-10-22 DIAGNOSIS — L309 Dermatitis, unspecified: Secondary | ICD-10-CM | POA: Diagnosis not present

## 2023-10-22 DIAGNOSIS — S61205A Unspecified open wound of left ring finger without damage to nail, initial encounter: Secondary | ICD-10-CM | POA: Diagnosis not present

## 2023-10-22 DIAGNOSIS — X58XXXA Exposure to other specified factors, initial encounter: Secondary | ICD-10-CM | POA: Insufficient documentation

## 2023-10-22 DIAGNOSIS — K631 Perforation of intestine (nontraumatic): Secondary | ICD-10-CM | POA: Diagnosis not present

## 2023-10-24 ENCOUNTER — Ambulatory Visit (INDEPENDENT_AMBULATORY_CARE_PROVIDER_SITE_OTHER): Admitting: Family Medicine

## 2023-10-24 ENCOUNTER — Encounter: Payer: Self-pay | Admitting: Family Medicine

## 2023-10-24 VITALS — BP 119/64 | HR 66 | Temp 98.3°F | Ht 70.0 in | Wt 219.2 lb

## 2023-10-24 DIAGNOSIS — Z7984 Long term (current) use of oral hypoglycemic drugs: Secondary | ICD-10-CM | POA: Diagnosis not present

## 2023-10-24 DIAGNOSIS — E1129 Type 2 diabetes mellitus with other diabetic kidney complication: Secondary | ICD-10-CM

## 2023-10-24 DIAGNOSIS — Z933 Colostomy status: Secondary | ICD-10-CM

## 2023-10-24 DIAGNOSIS — E119 Type 2 diabetes mellitus without complications: Secondary | ICD-10-CM | POA: Diagnosis not present

## 2023-10-24 DIAGNOSIS — K439 Ventral hernia without obstruction or gangrene: Secondary | ICD-10-CM

## 2023-10-24 DIAGNOSIS — R809 Proteinuria, unspecified: Secondary | ICD-10-CM | POA: Diagnosis not present

## 2023-10-24 DIAGNOSIS — K50118 Crohn's disease of large intestine with other complication: Secondary | ICD-10-CM

## 2023-10-24 DIAGNOSIS — D721 Eosinophilia, unspecified: Secondary | ICD-10-CM

## 2023-10-24 LAB — POCT GLYCOSYLATED HEMOGLOBIN (HGB A1C)
HbA1c POC (<> result, manual entry): 5.6 % (ref 4.0–5.6)
HbA1c, POC (controlled diabetic range): 5.6 % (ref 0.0–7.0)
HbA1c, POC (prediabetic range): 5.6 % — AB (ref 5.7–6.4)
Hemoglobin A1C: 5.6 % (ref 4.0–5.6)

## 2023-10-24 MED ORDER — METHYLPHENIDATE HCL ER 20 MG PO TBCR: 20.0000 mg | EXTENDED_RELEASE_TABLET | Freq: Every day | ORAL | 0 refills | Status: DC

## 2023-10-24 NOTE — Progress Notes (Signed)
 OFFICE VISIT  10/24/2023  CC:  Chief Complaint  Patient presents with   Medical Management of Chronic Issues    Patient is a 68 y.o. male who presents for 8-month follow-up diabetes, adult ADD, and eosinophilia. A/P as of last visit: 1 diabetes without complication.  Well-controlled on metformin  1000 mg twice a day. POC Hba1C TODAY 5.7%.   #2 hypercholesterolemia, doing well on atorvastatin  40 mg a day. Lipid panel and hepatic panel today.   #3 adult ADD. He has done well long-term with use of Metadate  ER 20 mg on an as-needed basis. A new prescription was not needed today.   #4 Crohn's disease, in remission on Skyrizi.  History of sigmoidectomy. He has a colostomy. He has significant ventral hernia and small parastomal hernia which give him quite a bit of discomfort. He is strongly considering getting hernia repair with mesh and colostomy takedown.  If he does this it will be late this summer.  INTERIM HX: Calla says he feels very well.  Adult ADD: Pt states all is going well with the med at current dosing (Metadate  ER 20 mg once a day as needed): much improved focus, concentration, task completion.  Less frustration, better multitasking, less impulsivity and restlessness.  Mood is stable. No side effects from the medication.  Crohn's disease: Most recent GI follow-up with Dr. Harlee was 09/30/2023. Crohn's in remission on Skyrizi. No changes were made. MD said it would be okay to do colostomy takedown whenever he gets his ventral hernia repair done. This is planned for September 10.  He is very happy with the care that he received at the wound clinic.  He went for 4 visits and they were successful in getting his abdominal wound sealed up.  He is eating a great diabetic diet. Home glucoses average 115.  He is taking metformin  500 mg dose twice a day. No burning, tingling, or numbness in feet.  ROS as above, plus--> no fevers, no CP, no SOB, no wheezing, no cough, no  dizziness, no HAs, no rashes, no melena/hematochezia.  No polyuria or polydipsia.  No myalgias or arthralgias.  No focal weakness, paresthesias, or tremors.  No acute vision or hearing abnormalities.  No dysuria or unusual/new urinary urgency or frequency.  No recent changes in lower legs. No n/v/d or abd pain.  No palpitations.    Past Medical History:  Diagnosis Date   Adult ADHD    Allergic rhinitis    Colon polyps 2012 and 2013   All hyperplastic except one adenomatous w/out high grade dysplasia (recall 2023 per Dr. Debrah)   Colostomy in place [facility]) 01/2022   COVID-19 virus infection 09/29/2020   Crohn disease (HCC)    Follows w/ [facility]. s/p left colectomy w/ colostomy in 2023   Elevated transaminase level 2017   Viral hep screening neg.  Suspect fatty liver.   Erectile dysfunction    History of pneumonia    History of retinal tear    11/2022   Hyperlipidemia    Hypertension    hx of   Large bowel perforation (HCC) 02/2022   delayed, s/p colonoscopy w/biopsies (new dx crohn's/inflammatory BD)   Obesity    OSA on CPAP    Severe.  CPAP 10 cm H2O.  Follows [facility].   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 Debby did surg   Peripheral neuropathy    feet   Pneumonia    several years ago as of 04/29/23   Rectal varices    +  rectal bleed while in hosp 2023   Right leg DVT (HCC)    04/12/22 R fem and pop, treated for 3 months with Eliquis    Seborrheic dermatitis of scalp    and face: hydrocort  2% cream bid prn to face, and fluocinonide 0.5% sol'n to scalp bid prn per Dr. Ivin (04/2014)   Secondary male hypogonadism 04/2021   Type 2 diabetes mellitus with microalbuminuria (HCC) DM dx-09/27/2015. Microalb 2020   Follows w/ PCP, Dr. Manus Hockey.   Ventral hernia    large   Wears glasses     Past Surgical History:  Procedure Laterality Date   COLECTOMY Left 01/2022   COLONOSCOPY     03/2023: Normal terminal ileum and normal  colon above the colostomy.   COLONOSCOPY W/ POLYPECTOMY  04/2010; 10/2011   Initial screening TCS showed 6 polyps (one adenomatous).  Pt says he returned for repeat TCS 10/2011 showed one hyperplastic polyp: Recall 10 yrs per Dr. Debrah   EYE SURGERY     Laser surg L eye for retinal tear 11/16/22   FLEXIBLE SIGMOIDOSCOPY N/A 02/25/2022   Procedure: FLEXIBLE SIGMOIDOSCOPY;  Surgeon: Wilhelmenia Aloha Raddle., MD;  Location: [facility];  Service: Gastroenterology;  Laterality: N/A;   FLEXIBLE SIGMOIDOSCOPY N/A 03/04/2022   Procedure: FLEXIBLE SIGMOIDOSCOPY;  Surgeon: San Sandor GAILS, DO;  Location: [facility];  Service: Gastroenterology;  Laterality: N/A;   HEMOSTASIS CONTROL  02/25/2022   Procedure: HEMOSTASIS CONTROL;  Surgeon: Wilhelmenia Aloha Raddle., MD;  Location: [facility];  Service: Gastroenterology;;   HEMOSTASIS CONTROL  03/04/2022   Procedure: HEMOSTASIS CONTROL;  Surgeon: San Sandor GAILS, DO;  Location: [facility];  Service: Gastroenterology;;   INCISION AND DRAINAGE ABSCESS N/A 11/07/2021   Procedure: Incision and drainage of scrotal abscess;  Surgeon: Debby Hila, MD;  Location: [facility];  Service: General;  Laterality: N/A;   IR SINUS/FIST TUBE CHK-NON GI  03/06/2022   LAPAROTOMY N/A 02/07/2022   Resection of descending and sigmoid colon with transverse colostomy.Procedure: EXPLORATORY LAPAROTOMY, COLON RESECTION, OSTOMY;  Surgeon: Debby Hila, MD;  Location: [facility];  Service: General;  Laterality: N/A;   MOUTH SURGERY     PLACEMENT OF SETON N/A 05/02/2023   Procedure: PLACEMENT OF SETON incison ad drainage of perirectal abcess;  Surgeon: Debby Hila, MD;  Location: [facility];  Service: General;  Laterality: N/A;   RECTAL EXAM UNDER ANESTHESIA N/A 05/02/2023   Procedure: RECTAL EXAM UNDER ANESTHESIA INTERROGATION OF ANAL FISTULA;  Surgeon: Debby Hila, MD;  Location: [facility];  Service: General;  Laterality: N/A;   TRANSTHORACIC  ECHOCARDIOGRAM     03/05/22 NORMAL, EF 60 - 65%    Outpatient Medications Prior to Visit  Medication Sig Dispense Refill   Accu-Chek Softclix Lancets lancets SMARTSIG:Topical 200 each 5   acetaminophen  (TYLENOL ) 325 MG tablet Take 2 tablets (650 mg total) by mouth every 4 (four) hours as needed for mild pain, moderate pain, fever or headache.     ascorbic acid  (VITAMIN C ) 500 MG tablet Take 1 tablet (500 mg total) by mouth 2 (two) times daily.     aspirin EC 81 MG tablet Take 81 mg by mouth daily. Swallow whole.     atorvastatin  (LIPITOR) 40 MG tablet Take 1 tablet (40 mg total) by mouth daily. 90 tablet 3   Continuous Blood Gluc Receiver (FREESTYLE LIBRE 3 READER) DEVI 1 each by Does not apply route every 14 (fourteen) days. 2 each 2   Continuous Glucose Sensor (FREESTYLE LIBRE 3  SENSOR) MISC Place 1 sensor on the skin every 14 days. Use to check glucose continuously 2 each 2   ferrous sulfate  325 (65 FE) MG EC tablet Take 325 mg by mouth daily with breakfast.     glucose blood (ACCU-CHEK GUIDE) test strip 1 each by Other route 4 (four) times daily -  before meals and at bedtime. 300 strip 5   magnesium  oxide (MAG-OX) 400 (240 Mg) MG tablet TAKE 2 TABLETS (800 MG TOTAL) BY MOUTH 3 (THREE) TIMES DAILY. (Patient taking differently: Take 800 mg by mouth 2 (two) times daily.) 180 tablet 1   metFORMIN  (GLUCOPHAGE ) 1000 MG tablet TAKE 1 TABLET (1,000 MG TOTAL) BY MOUTH TWICE A DAY WITH FOOD (Patient taking differently: 500 mg 2 (two) times daily with a meal.) 180 tablet 3   Multiple Vitamin (MULTIVITAMIN WITH MINERALS) TABS tablet Take 1 tablet by mouth daily.     Risankizumab-rzaa (SKYRIZI) 360 MG/2.4ML SOCT Inject into the skin. Every eight weeks     sildenafil  (VIAGRA ) 100 MG tablet Take 1 tablet (100 mg total) by mouth daily as needed for erectile dysfunction. 30 tablet 6   thiamine  (VITAMIN B-1) 100 MG tablet Take 1 tablet (100 mg total) by mouth daily.     zinc  sulfate 220 (50 Zn) MG capsule  Take 1 capsule (220 mg total) by mouth daily. 30 capsule 0   HYDROcodone -acetaminophen  (NORCO/VICODIN) 5-325 MG tablet Take 1 tablet by mouth every 6 (six) hours as needed for moderate pain (pain score 4-6). 30 tablet 0   methylphenidate  (METADATE  ER) 20 MG ER tablet Take 1 tablet (20 mg total) by mouth daily. 30 tablet 0   No facility-administered medications prior to visit.    Allergies  Allergen Reactions   Lobster [Shellfish Allergy] Nausea Only   Poison Ivy Extract Itching    Review of Systems As per HPI  PE:    10/24/2023    1:19 PM 08/22/2023    1:50 PM 08/15/2023   10:59 AM  Vitals with BMI  Height 5' 10 5' 10 5' 10  Weight 219 lbs 3 oz 209 lbs 13 oz 211 lbs 6 oz  BMI 31.45 30.1 30.33  Systolic 119 113 888  Diastolic 64 72 68  Pulse 66 63 72     Physical Exam  Gen: Alert, well appearing.  Patient is oriented to person, place, time, and situation. AFFECT: pleasant, lucid thought and speech. Foot exam -  no swelling, tenderness or skin or vascular lesions. Color and temperature is normal. Sensation is intact. Peripheral pulses are palpable. Toenails are normal.   LABS:  Last CBC Lab Results  Component Value Date   WBC 8.6 08/15/2023   HGB 14.2 08/15/2023   HCT 42.5 08/15/2023   MCV 97.0 08/15/2023   MCH 30.4 05/24/2022   RDW 13.5 08/15/2023   PLT 247.0 08/15/2023   Last metabolic panel Lab Results  Component Value Date   GLUCOSE 122 (H) 07/07/2023   NA 140 07/07/2023   K 4.3 07/07/2023   CL 103 07/07/2023   CO2 28 07/07/2023   BUN 11 07/07/2023   CREATININE 0.51 07/07/2023   GFR 105.10 07/07/2023   CALCIUM  9.7 07/07/2023   PHOS 2.3 (L) 03/01/2022   PROT 7.8 07/07/2023   ALBUMIN  4.4 07/07/2023   BILITOT 0.6 07/07/2023   ALKPHOS 79 07/07/2023   AST 15 07/07/2023   ALT 14 07/07/2023   ANIONGAP 7 04/12/2022   Last lipids Lab Results  Component Value Date  CHOL 121 07/07/2023   HDL 56.90 07/07/2023   LDLCALC 50 07/07/2023   LDLDIRECT 139.9  06/13/2011   TRIG 68.0 07/07/2023   CHOLHDL 2 07/07/2023   Last hemoglobin A1c Lab Results  Component Value Date   HGBA1C 5.6 10/24/2023   HGBA1C 5.6 10/24/2023   HGBA1C 5.6 (A) 10/24/2023   HGBA1C 5.6 10/24/2023   Last thyroid  functions Lab Results  Component Value Date   TSH 2.58 03/10/2019   Last vitamin D  Lab Results  Component Value Date   VD25OH 35.02 07/07/2023   Last vitamin B12 and Folate Lab Results  Component Value Date   VITAMINB12 614 11/28/2022   FOLATE 7.0 12/13/2019   IMPRESSION AND PLAN:  #1 diabetes with history of microalbuminuria. Excellent control with metformin  500 mg twice a day.  His hemoglobin A1c today is 5.6%. He will continue to try to slowly decrease his metformin  as he monitors home glucoses. Feet exam today normal. Basic metabolic panel today.  #2 Eosinophilia. We have detected this on routine blood work 4/21, 5/21, and 5/30, 2025 (range has been 11-15%). Unknown etiology. The first elevation was after he got on Skyrizi. He asked his gastroenterologist if Nelma could do this and he was told that it is unlikely but possible. He has no signs/symptoms of parasitic infection or significant allergic disease. Repeat CBC with differential today.  #3 adult ADD. He has done well long-term on Metadate  ER 20 mg a day on an as needed basis. #30 prescribed today.  #4 hypercholesterolemia, doing well on atorvastatin  40 mg a day. LDL was 50 approximately 3 months ago. Plan repeat lipids in 3 months.  #5 Crohn's disease in remission on Skyrizi. History of descending and sigmoid colon resection, colostomy. Complicated by large ventral hernia. Fortunately with the help of the wound care clinic the abdominal wound over the ventral hernia region has healed. He is set up for ventral hernia repair and colostomy takedown on September 10th. He has a history of hypophosphatemia and hypomagnesemia.  He is on magnesium  supplement (Mag-Ox 400 mg tabs, 2  tabs twice a day). Check magnesium  and phosphorus levels today.  An After Visit Summary was printed and given to the patient.  FOLLOW UP: Return in about 3 months (around 01/24/2024) for routine chronic illness f/u. Next CPE April 2026 Signed:  Gerlene Hockey, MD           10/24/2023

## 2023-10-25 LAB — CBC WITH DIFFERENTIAL/PLATELET
Absolute Lymphocytes: 2165 {cells}/uL (ref 850–3900)
Absolute Monocytes: 713 {cells}/uL (ref 200–950)
Basophils Absolute: 57 {cells}/uL (ref 0–200)
Basophils Relative: 0.7 %
Eosinophils Absolute: 590 {cells}/uL — ABNORMAL HIGH (ref 15–500)
Eosinophils Relative: 7.2 %
HCT: 43.9 % (ref 38.5–50.0)
Hemoglobin: 14.9 g/dL (ref 13.2–17.1)
MCH: 33.2 pg — ABNORMAL HIGH (ref 27.0–33.0)
MCHC: 33.9 g/dL (ref 32.0–36.0)
MCV: 97.8 fL (ref 80.0–100.0)
MPV: 9.6 fL (ref 7.5–12.5)
Monocytes Relative: 8.7 %
Neutro Abs: 4674 {cells}/uL (ref 1500–7800)
Neutrophils Relative %: 57 %
Platelets: 229 Thousand/uL (ref 140–400)
RBC: 4.49 Million/uL (ref 4.20–5.80)
RDW: 12.6 % (ref 11.0–15.0)
Total Lymphocyte: 26.4 %
WBC: 8.2 Thousand/uL (ref 3.8–10.8)

## 2023-10-25 LAB — BASIC METABOLIC PANEL WITH GFR
BUN/Creatinine Ratio: 27 (calc) — ABNORMAL HIGH (ref 6–22)
BUN: 15 mg/dL (ref 7–25)
CO2: 27 mmol/L (ref 20–32)
Calcium: 9.6 mg/dL (ref 8.6–10.3)
Chloride: 104 mmol/L (ref 98–110)
Creat: 0.56 mg/dL — ABNORMAL LOW (ref 0.70–1.35)
Glucose, Bld: 102 mg/dL — ABNORMAL HIGH (ref 65–99)
Potassium: 4.3 mmol/L (ref 3.5–5.3)
Sodium: 141 mmol/L (ref 135–146)
eGFR: 108 mL/min/1.73m2 (ref >=60)

## 2023-10-25 LAB — MICROALBUMIN / CREATININE URINE RATIO
Creatinine, Urine: 38 mg/dL (ref 20–320)
Microalb Creat Ratio: 34 mg/g{creat} — ABNORMAL HIGH (ref <30)
Microalb, Ur: 1.3 mg/dL

## 2023-10-25 LAB — MAGNESIUM: Magnesium: 1.9 mg/dL (ref 1.5–2.5)

## 2023-10-25 LAB — PHOSPHORUS: Phosphorus: 3.4 mg/dL (ref 2.1–4.3)

## 2023-10-26 ENCOUNTER — Ambulatory Visit: Payer: Self-pay | Admitting: Family Medicine

## 2023-11-10 NOTE — Telephone Encounter (Signed)
 Called patient's wife. Notified of new surgery date that has not been completed yet. Working on getting Dr. Darral & Dr. Allyne lined up.

## 2023-11-24 ENCOUNTER — Telehealth: Payer: Self-pay

## 2023-11-24 NOTE — Telephone Encounter (Signed)
 Reason for CRM: Patient states he missed a call from office at the the time no messages or notes in system.    Yuki [phone]

## 2023-11-24 NOTE — Telephone Encounter (Signed)
 Spoke with pt, confirmed we had not reached out to him today. No documentation found in pt's chart or record

## 2024-01-02 NOTE — Progress Notes (Signed)
 Colorectal Surgery Progress Note   LOS: 9 days  Procedure(s): Procedure(s) (LRB): COLOSTOMY CLOSURE, Exploratory Laparotomy, Extensive Lysis of Adhesions (N/A) REPAIR HERNIA COMPONENT SEPARATION WITH MESH OPEN, PLACEMENT OF INCISIONAL WOUND VACAND JP DRAIN (N/A) 9 Days Post-Op  Subjective: 24 Hour Events:  10/17: POD8: NAEO.  NGT removed yesterday, transitioned diet to CLD.  Patient reports he is doing well and is hungry. He continues to pass gas and had 1x good 'clumpy' bowel movement yesterday. UOP appropriate.  Will continue to monitor, encourage ambulation, and advance diet as appropriate.  Wound vac dressing removed by MIS.   10/16 POD7: NAEO. NGT had 1150cc over the last 24hrs, we will continue to keep NPO and conduct a 6 hour clamp trial and revaluate. This morning the patient reports he is doing well and is hungry. He continues to pass gas and had a bowel movement. UOP appropriate but had a few unmeasured.   10/15 POD7: NAEO. NGT documented only 295cc over the last 24hrs but given the continued distention of the patient a repeat KUB was obtained and demonstrated continued small bowel distention. He also some hiccups overnight and still having a productive cough for which aggressive IS use was encouraged. He has been ambulating numerously and UOP remains appropriate. He has had 2 bowel movements overnight.  10/14POD6: Yesterday the patient had a CT CAP that demonstrated small bowel ileus but no signs of an anastomotic leak. CXR was concerning for perihilar opacities with c/f possible aspiration. NGT overnight put out 3.7L and he was given a 1L bolus of fluid. He will remain NPO given this high output. His WBC is improving at 5.4 <3.8. This morning the patient continues to pass gas and had 1 bowel movement while in the CT scanner after the rectal contrast. He is no longer having a lot of mucus and coughing.   10/13 POD5: Overnight patient accidentally stepped on IVAC but it was replaced  without issue. Had small volume mucus-like emesis. Leukopenia overnight wbc 3.8 c/f acute infection, will conduct a full infectious work up. Patient didtended and tympanic on exam, kub and NGT inserted.   10/12: POD4. NAEON. Patient reports less flatus today vs yesterday with increased bloating and nausea. Discussed returning to sips of clear liquids today though will leave regular diet in place for now. Suppository ordered. IVF at 50.    10/11 POD3: NAEON, VSS, afebrile on RA. Wound vac in place. Discussed working on spacing out pain medication to prepare for home. Endorsed flatus. Advanced to regular diet. IVF stopped.   10/10 POD2: NAEON, VSS, afebrile on RA. Endorsing pain with movement worsened since block wear off. He reports improvement with PO oxycodone . Endorsed small volume flatus. Tolerating FLD  Interval History: From MIS Note Richard [name] is 68 y.o. male with PMHx of  HTN, DM2, Crohn's disease currently in remission as of 04/09/2023 on Skyrizi with PSHx of a transverse colostomy Hartman's pouch  s/p  colostomy closure on 12/24/2023 by Dr. Darral and component separation hernia repair with Phasix mesh on 12/25/2023 by Dr. Allyne.  Objective: Vital signs in last 24 hours: Temp:  [97.9 F (36.6 C)-98.9 F (37.2 C)] 98.8 F (37.1 C) Heart Rate:  [56-96] 96 Resp:  [16-18] 16 BP: (123-148)/(57-77) 123/57   O2 Device: None (Room air)      Intake/Output last 3 shifts: I/O last 3 completed shifts: In: 4849.3 [I.V.:4849.3] Out: 1550 [Urine:950; Emesis/NG output:550; Drains:50] Intake/Output this shift: No intake/output data recorded.  Intake/Output Summary (Last 24 hours) at 01/02/2024  1142 Last data filed at 01/02/2024 0656 Gross per 24 hour  Intake 2262 ml  Output 430 ml  Net 1832 ml    Physical Exam: General: male, no apparent distress  HEENT: Normocephalic, moist mucous membranes, NGT in place  Cardiovascular: Hemodynamically stable  Chest/Lungs: Non-labored breathing   Abdomen: Soft, non-tender, mildly distended (improved from 10/16) Wound: clean wound dressings over incision and laparoscopic sites  Extremities: Moves extremities spontaneously  Skin: Warm and dry  GU: No Foley  Neuro: No focal deficits        Labs: CBC: Recent CBC    12/30/23 2348 01/01/24 0012 01/01/24 2333  WBC 7.00 11.50* 12.00*  RBC 3.20* 3.49* 3.32*  HGB 10.6* 11.6* 10.8*  HCT 30.9* 33.5* 31.9*  MCV 96.5* 95.9 95.9  MCHC 34.2 34.6 33.9  RDW 13.5 13.9 13.8  PLT 246 306 283  MPV 7.4 7.6 7.1     BMP/CMP: Recent CMP    12/30/23 2348 01/01/24 0012 01/01/24 2333  NA 136 138 139  K 3.8 3.8 3.7  CL 98 99 102  CO2 28 25 24   BUN 14 7 6*  CREATININE 0.34* 0.38* 0.33*  CALCIUM  7.5* 8.0* 7.7*  ANIONGAP 10 14 13    At least one component is invalid ALKPHOS   Coags: @TLABRCNTPTPTT (INR:3,LABPROT:3,PTT:3)@    Diagnostic Studies: @RCNTIMAGE72 @  Consults/Discussions: MIS - iVac removed, ostomy site packed per primary, silverlon placed over midline w/ ABDs on top. - staples to be removed 10/17, replaced with steri strips - JP drain with 20 cc past 24h; drain will be removed on discharge  Assessment & Plan: Richard [name] is a 68 y.o. male with Crohn's s/p Colostomy Closure and Hernia repair with Mesh  Neurological: Pain control - Currently controlled on current regimen --Tylenol  1g q6hr, Robaxin 750mg  tid   Cardiovascular: Hemodynamically stable - continue to monitor   Pulmonary: Aggressive pulm toilet, frequent ambulation and IS   GastroIntestinal: s/p Colostomy Closure and Hernia repair with Mesh: - Solid/clumpy bowel movements daily, passing flatus - improved abdominal exam from previous, only mildly distended, non-tympanic - CLD, NGT removed 10/16. - 10/15 KUB demonstrated small bowel distention  - IVAC removed per MIS, clean Silverlon dressing for incision site - Staples Removed today Per MIS   - Zofran  PRN for nausea   Hematology-Infectious  Disease: Hgb stable 10.8  Afebrile, WBC 12.0 - CXR Right sided perihilar opacity - UA negative for Infection - CT AP IV/Rectal Contrast   Abx: None  DVT ppx: - SCDs - Lovenox  40qd  F/E/N: F:LR @ 49ml/hr--> SLIV in PM E: Replete as indicated  N: CLD on 10/17 progress to FLD in PM  Endocrine: Monitor for stress hypoglycemia  T2DM: A1C 6.8 (10/9) - Insulin  Lispro 0-10 subQ qid w/ meals  Renal: Foley plan: No foley   Skin-Wound: Routine wound care   Disposition: Stable on floor Will need 24hrs tolerating regular diet prior to discharge  Will follow up with Dr. Darral at [facility] on November 18th   Electronically signed by: Dena Johnnie Maffucci, MD 01/02/2024 11:42 AM

## 2024-01-02 NOTE — Discharge Summary (Signed)
 ------------------------------------------------------------------------------- Attestation signed by Ilah Leavy Chihuahua, MD at 01/05/2024  8:43 AM Attestation (Dr. Chihuahua):  I personally confirmed the history, performed the examination and reviewed the data and imaging for Richard [name] myself on the day of the service.  I saw the patient with the resident and agree with the findings and plan as documented.  Electronically signed by: Ilah LELON Chihuahua, MD 01/05/2024 8:43 AM   -------------------------------------------------------------------------------  Colorectal Surgery Discharge Summary  Patient ID: Richard [name] [id number] 68 y.o. 07-19-1955  Admit date: 12/24/2023 Admitting Physician: Cordella Gayla Samples, MD Admission Condition: stable Admission Diagnoses: HTN, DM2, Crohn's disease currently in remission      Discharge date: 01/04/24 Discharge Physician: Dr. Ilah Chihuahua, MD  Discharged Condition: good Discharge Diagnoses:  Principal Problem:   Crohn's colitis, other complication Active Problems:   Colostomy in place    (CMD) Resolved Problems:   * No resolved hospital problems. *   Procedures/Surgeries performed during hospitalization:  COLOSTOMY CLOSURE REPAIR HERNIA COMPONENT SEPARATION WITH OR WITHOUT MESH OPEN  Procedure(s) (LRB): COLOSTOMY CLOSURE, Exploratory Laparotomy, Extensive Lysis of Adhesions (N/A) REPAIR HERNIA COMPONENT SEPARATION WITH MESH OPEN, PLACEMENT OF INCISIONAL WOUND VACAND JP DRAIN (N/A)  Indication for Admission:  Diagnosis as above requiring intervention and/or monitoring.  Hospital Course:  From initial consult note: Richard [name] is 68 y.o. male with PMHx of  HTN, DM2, Crohn's disease currently in remission as of 04/09/2023 on Skyrizi with PSHx of a transverse colostomy Hartman's pouch  s/p  colostomy closure on 12/24/2023 by Dr. Samples and component separation hernia repair with Phasix mesh by Dr. Allyne.   Pt was taken to the  operating room on 12/24/2023 where he underwent the above mentioned procedure without complication.   10/10-11: Post-op course initially uncomplicated; tolerating full liquid diet, passing flatus, pain controlled, afebrile, hemodynamically stable. Advanced to regular diet, ambulating, passing flatus, mild abdominal tenderness, IVF stopped.  10/12: Developed moderate abdominal distention, nausea, and decreased flatus; small bowel movement; suppository ordered; regular diet maintained but encouraged slow oral intake; NGT placed for high output; CXR showed perihilar opacities concerning for possible aspiration; CT abdomen/pelvis with IV/rectal contrast showed small bowel ileus, no anastomotic leak.   10/13: Continued distention and tympany; leukopenia (WBC 3.8); infectious workup initiated (CXR, UA, CT); NGT to low intermittent wall suction with 3.7L output; abdominal binder removed due to edema; remained NPO; pain controlled; IVAC replaced after accidental dislodgement; bowel movement in CT scanner after rectal contrast; UA negative for infection; CXR with right perihilar opacity; CT confirmed ileus without leak.   10/14: NGT output decreased to 295cc; repeat KUB showed persistent small bowel distention; continued productive cough and hiccups; ambulating; passing flatus and bowel movements; WBC improved to 5.4; remained NPO; received 1L fluid bolus for NGT losses.  10/15: iVAC removed, silverlon dressing applied over midline wound, ostomy site packed; NGT output 1150cc; continued distention; clamp trial of NGT planned; bowel movements present; pain controlled; afebrile; hemodynamically stable; ongoing routine wound care.   10/16: Pt hungry but improving, passing flatus and bowel movements; remains NPO; NGT removed post clamp trial ; UOP appropriate; WBC increased to 11.5; Pt stable on floor.   10/17:  Pt transitioned to CLD; doing well and is hungry. He continues to pass gas and had 1x good 'clumpy' bowel  movement yesterday, able to ambulate independently.  Denies an N/V, fever, chills, HA. UOP appropriate.  Transition to regular diet tomorrow.   10/18- 10/19: Patient transitioned to a regular diet and tolerated  24hrs without fluids and maintaining stable PO intake. He was concerned about the consistency of his stool but was counseled on stool transitioning as diet becomes more normal. The drain was removed by the MIS and his wife completed his wound dressing change for home practice.   At the time of discharge the patient is afebrile and hemodynamically stable. His pain is well controlled with oral pain medications including. They are tolerating a regular diet without nausea/vomiting. The patient has met all goals necessary for discharge from a medical and surgical standpoint and is considered stable and suitable for discharge. Discharge medications were discussed in detail and the patient stated understanding of use and administration. Instructions for appropriate wound care were also discussed. The patient verbalized understanding of all discharge instructions and therefore was released.   Significant Diagnostic Studies:   XR Abdomen 1 View  Final Result by Thurmon JULIANNA Medford Rosena, MD [id number])  XR ABDOMEN 1 VIEW, 12/31/2023 6:42 AM     INDICATION: abdominal distension   COMPARISON: None.    TECHNIQUE: Supine radiograph(s) of the abdomen were obtained.    FINDINGS:    . Gastrointestinal tract: Mildly dilated small and large bowel loops  . Calcifications: No apparent pathologic calcification.  . Musculoskeletal: No acute bony abnormality.  . Lower chest: No acute process.  . Lines/Tubes/Surgical: Gastric decompression tube with the sidehole in   the distal body. Surgical drains noted. Surgical clips  . Additional comments: None.    IMPRESSION:  Mildly dilated small and large bowel loops likely representing ileus.              XR Abdomen 1 View  Final Result by Ozell Govern, MD (10/13 1622)  XR ABDOMEN 1 VIEW, 12/29/2023 4:12 PM     INDICATION: NGT confirmation   COMPARISON: CT CAP 12/29/23    TECHNIQUE: Limited field-of-view projection(s) of the torso were obtained   for purpose of assessing enteric tube placement. Note that positioning and   technical factors may preclude detailed characterization of other   structures typically reported on standard radiograph series.    IMPRESSION:  FINDINGS and IMPRESSION:    Focused evaluation of nasogastric tube shows the tube curled in the fundus   with the side port and tip projecting over the mid to distal gastric body.              CT Chest Abdomen Pelvis W Contrast  Final Result by Maudie Marget Alpers, MD (10/13 1612)  CT CHEST ABDOMEN PELVIS W CONTRAST, 12/29/2023 1:53 PM    INDICATION:  Sepsis, C/f colonic anastomotic leak and aspiration from   ileus \   ADDITIONAL HISTORY: Postop day 5 following colostomy closure and hernia   repair..  COMPARISON: Abdominal pelvis CT 05/01/2023    TECHNIQUE: CT images of the chest, abdomen, and pelvis were obtained   following IV and rectal/PO administration of iodinated contrast.   Conventional axial reconstructions and multiplanar reformatted images were   submitted for review.      FINDINGS:    . Thoracic inlet: Heterogeneous appearance of the left thyroid  lobe   without discrete nodule.  . Chest wall/Axilla: Bilateral gynecomastia.  . Central airways: Patent.  . Mediastinum/Hila: Unremarkable.  SABRA Heart: Heart size within normal limits. No pericardial effusion.  . Vessels: Aorta normal in caliber. No central pulmonary embolism.  . Lungs/Pleura: No large airspace consolidation. Small left greater than   right pleural effusions. Bibasilar atelectasis.    . Liver: No suspicious focal  findings.  . Gallbladder/Biliary: Unremarkable.  SABRA Spleen: Unremarkable.  . Pancreas: Unremarkable.  . Adrenals: Unremarkable.  . Kidneys: Unremarkable.    .  Peritoneum/Mesenteries/Extraperitoneum: Trace pneumoperitoneum, few gas   bubbles scattered. Small volume ascites. No pathologically enlarged lymph   nodes. Midline laparotomy.  . Gastrointestinal tract: Gastric suction tube tip terminates within the   gastric body. Enema tip in the low rectum. Contrast opacifies the majority   of the stomach, duodenum, proximal jejunum, colon, and rectum. Similar   duodenal lipoma measuring 1.5 cm. Mildly edematous jejunum. Dilated and   fluid-filled loops of small bowel in the lower abdomen measuring up to 4   cm, though no definite transition point is appreciated. Angulated loops of   bowel in the left abdomen could reflect forming adhesions. Dilated cecum   measuring up to 9.1 cm. Normal appendix. Stasis of contents indicative of   slowed enteric transit.    SABRA Ureters: Unremarkable.  . Bladder: Locules of gas, likely from recent instrumentation.  . Reproductive System: Prostatomegaly.    . Vascular: Aortobiiliac atherosclerosis.  . Musculoskeletal: No acute displaced fractures. Polyarticular   degenerative changes. No aggressive focal bony lesions. Postsurgical   changes of ventral hernia repair with a surgical drain along the lower   abdominal wall soft tissues. Minimal amount of subcutaneous emphysema.    IMPRESSION:    1.  Postsurgical changes of recent colostomy closure and ventral abdominal   hernia repair. The anastomosis is patent without evidence of contrast   extravasation.  2.  Dilated loops of small bowel within the lower abdomen without a   definite transition point, currently favored to reflect a postoperative   ileus.  3.  Mildly edematous jejunum could reflect an enteritis.  4.  Small volume ascites and small bilateral pleural effusions; clinically   assess volume status.    XR Chest 1 View  Final Result by Bennet Ozell Hurst, MD (10/13 1130)  XR CHEST 1 VIEW, 12/29/2023 9:06 AM    INDICATION:eval for aspiration   COMPARISON:  None.    FINDINGS:     Supportive devices: Enteric tube courses inferiorly out of view.  Cardiovascular/lungs/pleura: Cardiac silhouette and pulmonary vasculature   are within normal limits. Right perihilar opacity. No pleural effusion or   pneumothorax.   Other: Unremarkable.    IMPRESSION:  Right perihilar opacity may represent early aspiration pneumonitis versus   atelectasis from low lung volumes. Attention on follow-up radiographs.    XR Abdomen 1 View  Final Result by Ozell Govern, MD [phone])  XR ABDOMEN 1 VIEW, 12/29/2023 9:06 AM     INDICATION: emesis   COMPARISON: None.    TECHNIQUE: Supine radiograph(s) of the abdomen were obtained.    FINDINGS:    . Gastrointestinal tract: Nonobstructive bowel gas pattern. Mild gaseous   distention of the small bowel. Gas distention of the ascending and   transverse colon. Cecum measures 9.4 cm..  . Calcifications: No apparent pathologic calcification.  . Musculoskeletal: No acute bony abnormality.  . Lower chest: No acute process.  . Lines/Tubes/Surgical: Gastric tube distal tip proximal body of stomach.   Surgical clips over the abdomen.  . Additional comments: None.    IMPRESSION:  Nonspecific nonobstructive gas pattern. Correlating with the patient's   history history suggest follow-up plain film to exclude developing or   partial obstruction..              US  Anesthesiology Images  Final Result by SYSTEMGENERATED, DOCUMENTATION (10/08 0802)  Treatments: Current Medications[1]  Medications Discontinued During This Encounter  Medication Reason  . sodium chloride  (irrigation) (NS) 0.9 % 250 mL with vancomycin  (VANCOCIN ) 1 g, gentamicin (GARAMYCIN) 80 mg irrigation Patient Discharge  . sterile water  irrigation solution Patient Discharge  . sodium chloride  (irrigation) (NS) 0.9 % irrigation solution Patient Discharge  . HYDROmorphone  (DILAUDID ) 1 mg/mL injection 0.5 mg Patient Transfer  .  prochlorperazine  (COMPAZINE ) injection 5 mg Patient Transfer  . lactated ringer 's infusion   . magnesium  sulfate 2g in sterile water  50 mL IVPB   . acetaminophen  (TYLENOL ) tablet 650 mg   . methocarbamoL (ROBAXIN) tablet 500 mg   . lactated ringer 's infusion   . D5 %-0.45 % sodium chloride  infusion      Discharge Exam: GENERAL: No acute distress, alert and oriented   HEENT: Normocephalic, atraumatic, sclerae anicteric, trachea midline  CARDIOVASCULAR: Regular rate, hemodynamically stable  PULMONARY: Normal work of breathing, good chest rise and fall ABDOMEN: Soft, non-tender, non-distended. No guarding or rebound tenderness. Incisions C/D/I with no surrounding erythema.  EXTREMITIES: Warm well perfused, moving all extremities spontaneously SKIN: Warm, dry, in tact  Disposition: home  Patient Instructions: Attached  Discharge Medications:   Medication List     ASK your doctor about these medications    acetaminophen  325 mg tablet Commonly known as: TYLENOL  Take 650 mg by mouth.   ascorbic acid  500 mg tablet Commonly known as: VITAMIN C  Take 500 mg by mouth daily. W/ B complex   aspirin 81 mg EC tablet Take 81 mg by mouth daily.   atorvastatin  40 mg tablet Commonly known as: LIPITOR Take 1 tablet by mouth daily.   ferrous sulfate  325 mg (65 mg iron) EC tablet Take 325 mg by mouth daily with breakfast.   FOLIC ACID ORAL Take by mouth daily.   ketoconazole  2 % cream Commonly known as: NIZORAL  APPLY ONCE DAILY TO THE IRRITATED SKIN   magnesium  oxide 400 mg Tab tablet Take 800 mg by mouth 2 (two) times a day.   metFORMIN  1,000 mg tablet Commonly known as: GLUCOPHAGE  TAKE 1 TABLET (1,000 MG TOTAL) BY MOUTH TWICE A DAY WITH FOOD   methylphenidate  ER 20 mg ER tablet Commonly known as: METADATE  Take 20 mg by mouth daily as needed.   MULTI VITAMIN ORAL Take by mouth daily.   sildenafiL  100 mg tablet Commonly known as: VIAGRA  Take 100 mg by mouth daily as  needed for erectile dysfunction.   Skyrizi 360 mg/2.4 mL (150 mg/mL) Injt Generic drug: risankizumab-rzaa Use Skyrizi on-body injector to inject 1 cartridge under the skin every 8 weeks (Inject 2.4 mL (360 mg total) under the skin every 8 (eight) weeks.)   triamcinolone  acetonide 0.1 % cream Commonly known as: KENALOG  APPLY ONCE DAILY TO SKIN IRRITATION   Vitamin B-1 100 mg tablet Generic drug: thiamine  Take 100 mg by mouth daily.   zinc  sulfate 220 mg capsule Commonly known as: ZINCATE Take 220 mg by mouth.        Discharge Instructions: The patient was instructed to call if he develops a fever greater than 101.5, any drainage from the incision, redness extending from the incision, unable to void, or any other questions or concerns.  The patient was instructed to follow-up with Dr. Darral scheduled for November 18th. Scheduled Future Appointments       Provider Department Dept Phone Center   01/22/2024 3:45 PM Cordella Bacon [facility] [place] [address] GENERAL SURGERY [phone] [facility] [place]  Debby Archer, Medical Student    Electronically signed by:  Dena Johnnie Maffucci, MD 01/04/2024 12:31 PM         [1]  Current Facility-Administered Medications:  .  acetaminophen  (TYLENOL ) tablet 1,000 mg, 1,000 mg, oral, Q6H, Leocadia Hutchinson, MD, 1,000 mg at 01/02/24 0540 .  aspirin EC tablet 81 mg, 81 mg, oral, Daily, Rumalda Scheuermann, MD, 81 mg at 01/02/24 0923 .  atorvastatin  (LIPITOR) tablet 40 mg, 40 mg, oral, Daily, Rui Zheng-Pywell, MD, 40 mg at 01/02/24 9076 .  BisaCODYL  (DULCOLAX) suppository 10 mg, 10 mg, rectal, Daily, Katherine Gray Welchmabon, MD, 10 mg at 12/28/23 1151 .  butalbital-acetaminophen -caffeine (FIORICET) per tablet 1 tablet, 1 tablet, oral, Q4H PRN, Dena Johnnie Maffucci, MD .  calcium  carbonate (TUMS) 500 mg (200 mg calcium ) chewable tablet 500 mg, 500 mg, oral, 4x Daily PRN, Kiyanna Imani Thomas, MD, 500 mg at 12/28/23  2315 .  dextrose  (D50W) 50 % injection 12.5 g, 12.5 g, intravenous, PRN, Rumalda Scheuermann, MD .  dextrose  (TRUEPLUS GLUCOSE) 15 gram/32 mL oral gel 15 g, 15 g, oral, PRN, Rui Zheng-Pywell, MD, 15 g at 01/02/24 0551 .  enoxaparin  (LOVENOX ) syringe 40 mg, 40 mg, subcutaneous, At Bedtime, Rumalda Scheuermann, MD, 40 mg at 01/01/24 2125 .  insulin  lispro (HumaLOG) injection 0-10 Units, 0-10 Units, subcutaneous, With meals & at bedtime, Rumalda Scheuermann, MD, 2 Units at 12/29/23 2059 .  lactated ringer 's infusion, 50 mL/hr, intravenous, Continuous, Leocadia Hutchinson, MD, Last Rate: 50 mL/hr at 01/02/24 0610, 50 mL/hr at 01/02/24 0610 .  methocarbamoL (ROBAXIN) tablet 750 mg, 750 mg, oral, TID, Amn Siddiqi, MD, 750 mg at 01/02/24 0540 .  ondansetron  (ZOFRAN ) injection 4 mg, 4 mg, intravenous, Q6H PRN, Katherine Gray Welchmabon, MD, 4 mg at 12/29/23 0435 .  oxyCODONE  (ROXICODONE ) immediate release tablet 5 mg, 5 mg, oral, Q6H PRN **OR** oxyCODONE  (ROXICODONE ) immediate release tablet 10 mg, 10 mg, oral, Q6H PRN, Dena Johnnie Maffucci, MD, 10 mg at 01/01/24 1530 .  pantoprazole  (PROTONIX ) injection 40 mg, 40 mg, intravenous, Q12H SCH, Dena Johnnie Maffucci, MD, 40 mg at 01/02/24 0923 .  phenoL (CHLORASEPTIC) 1.4 % spray 2 spray, 2 spray, Mouth/Throat, Q2H PRN, Dena Johnnie Maffucci, MD

## 2024-01-03 NOTE — Progress Notes (Signed)
 ------------------------------------------------------------------------------- Attestation signed by Ilah Leavy Chihuahua, MD at 01/05/2024  8:39 AM Attestation (Dr. Chihuahua):  I personally confirmed the history, performed the examination and reviewed the data and imaging for Richard [name] myself on the day of the service.  I saw the patient with the resident and agree with the findings and plan as documented.  Electronically signed by: Ilah LELON Chihuahua, MD 01/05/2024 8:39 AM   -------------------------------------------------------------------------------  Colorectal Surgery Progress Note   LOS: 10 days  Procedure(s): Procedure(s) (LRB): COLOSTOMY CLOSURE, Exploratory Laparotomy, Extensive Lysis of Adhesions (N/A) REPAIR HERNIA COMPONENT SEPARATION WITH MESH OPEN, PLACEMENT OF INCISIONAL WOUND VACAND JP DRAIN (N/A) 10 Days Post-Op  Subjective: 24 Hour Events:  10/18 POD9: NAEO. Patient doing well this morning, will advance to regular diet and reasses for 24hrs prior to discharge.   10/17 POD8: NAEO.  NGT removed yesterday, transitioned diet to CLD.  Patient reports he is doing well and is hungry. He continues to pass gas and had 1x good 'clumpy' bowel movement yesterday. UOP appropriate.  Will continue to monitor, encourage ambulation, and advance diet as appropriate.  Wound vac dressing removed by MIS.   10/16 POD7: NAEO. NGT had 1150cc over the last 24hrs, we will continue to keep NPO and conduct a 6 hour clamp trial and revaluate. This morning the patient reports he is doing well and is hungry. He continues to pass gas and had a bowel movement. UOP appropriate but had a few unmeasured.   10/15 POD7: NAEO. NGT documented only 295cc over the last 24hrs but given the continued distention of the patient a repeat KUB was obtained and demonstrated continued small bowel distention. He also some hiccups overnight and still having a productive cough for which aggressive IS use was encouraged. He  has been ambulating numerously and UOP remains appropriate. He has had 2 bowel movements overnight.  10/14POD6: Yesterday the patient had a CT CAP that demonstrated small bowel ileus but no signs of an anastomotic leak. CXR was concerning for perihilar opacities with c/f possible aspiration. NGT overnight put out 3.7L and he was given a 1L bolus of fluid. He will remain NPO given this high output. His WBC is improving at 5.4 <3.8. This morning the patient continues to pass gas and had 1 bowel movement while in the CT scanner after the rectal contrast. He is no longer having a lot of mucus and coughing.   10/13 POD5: Overnight patient accidentally stepped on IVAC but it was replaced without issue. Had small volume mucus-like emesis. Leukopenia overnight wbc 3.8 c/f acute infection, will conduct a full infectious work up. Patient didtended and tympanic on exam, kub and NGT inserted.   10/12: POD4. NAEON. Patient reports less flatus today vs yesterday with increased bloating and nausea. Discussed returning to sips of clear liquids today though will leave regular diet in place for now. Suppository ordered. IVF at 50.    10/11 POD3: NAEON, VSS, afebrile on RA. Wound vac in place. Discussed working on spacing out pain medication to prepare for home. Endorsed flatus. Advanced to regular diet. IVF stopped.   10/10 POD2: NAEON, VSS, afebrile on RA. Endorsing pain with movement worsened since block wear off. He reports improvement with PO oxycodone . Endorsed small volume flatus. Tolerating FLD  Interval History: From MIS Note KH is 68 y.o. male with PMHx of  HTN, DM2, Crohn's disease currently in remission as of 04/09/2023 on Skyrizi with PSHx of a transverse colostomy Hartman's pouch  s/p  colostomy closure  on 12/24/2023 by Dr. Darral and component separation hernia repair with Phasix mesh on 12/25/2023 by Dr. Allyne.  Objective: Vital signs in last 24 hours: Temp:  [97.2 F (36.2 C)-98.8 F (37.1 C)] 97.2  F (36.2 C) Heart Rate:  [67-96] 85 Resp:  [16-18] 17 BP: (123-159)/(57-79) 129/69   O2 Device: None (Room air)      Intake/Output last 3 shifts: I/O last 3 completed shifts: In: 3312 [P.O.:1000; I.V.:2312] Out: 1376 [Urine:1350; Drains:25; Stool:1] Intake/Output this shift: No intake/output data recorded.  Intake/Output Summary (Last 24 hours) at 01/03/2024 0701 Last data filed at 01/03/2024 0500 Gross per 24 hour  Intake 1050 ml  Output 1016 ml  Net 34 ml    Physical Exam: General: male, no apparent distress  HEENT: Normocephalic, moist mucous membranes, NGT in place  Cardiovascular: Hemodynamically stable  Chest/Lungs: Non-labored breathing  Abdomen: Soft, non-tender, mildly distended (improved from 10/16) Wound: clean wound dressings over incision and laparoscopic sites  Extremities: Moves extremities spontaneously  Skin: Warm and dry  GU: No Foley  Neuro: No focal deficits        Labs: CBC: Recent CBC    01/01/24 0012 01/01/24 2333 01/02/24 2328  WBC 11.50* 12.00* 11.00  RBC 3.49* 3.32* 3.56*  HGB 11.6* 10.8* 11.6*  HCT 33.5* 31.9* 34.4*  MCV 95.9 95.9 96.7*  MCHC 34.6 33.9 33.6  RDW 13.9 13.8 13.8  PLT 306 283 341  MPV 7.6 7.1 7.1     BMP/CMP: Recent CMP    01/01/24 0012 01/01/24 2333 01/02/24 2328  NA 138 139 135*  K 3.8 3.7 4.6  CL 99 102 101  CO2 25 24 22   BUN 7 6* 4*  CREATININE 0.38* 0.33* 0.43*  CALCIUM  8.0* 7.7* 8.0*  ANIONGAP 14 13 12    At least one component is invalid ALKPHOS   Coags: @TLABRCNTPTPTT (INR:3,LABPROT:3,PTT:3)@    Diagnostic Studies: @RCNTIMAGE72 @  Consults/Discussions: MIS - iVac removed, ostomy site packed per primary, silverlon placed over midline w/ ABDs on top. - staples to be removed 10/17, replaced with steri strips - JP drain with 20 cc past 24h; drain will be removed on discharge  Assessment & Plan: Richard [name] is a 68 y.o. male with Crohn's s/p Colostomy Closure and Hernia repair with  Mesh  Neurological: Pain control - Currently controlled on current regimen --Tylenol  1g q6hr, Robaxin 750mg  tid   Cardiovascular: Hemodynamically stable - continue to monitor   Pulmonary: Aggressive pulm toilet, frequent ambulation and IS   GastroIntestinal: s/p Colostomy Closure and Hernia repair with Mesh: - Solid/clumpy bowel movements daily, passing flatus - improved abdominal exam from previous, only mildly distended, non-tympanic - NGT removed 10/16. - 10/15 KUB demonstrated small bowel distention  - IVAC removed per MIS, clean Silverlon dressing for incision site - Staples Removed today Per MIS   - Zofran  PRN for nausea   Hematology-Infectious Disease: Hgb stable 10.8  Afebrile, WBC 12.0 - CXR Right sided perihilar opacity - UA negative for Infection - CT AP IV/Rectal Contrast   Abx: None  DVT ppx: - SCDs - Lovenox  40qd  F/E/N: F:SLIV E: Replete as indicated  N: Regular   Endocrine: Monitor for stress hypoglycemia  T2DM: A1C 6.8 (10/9) - Insulin  Lispro 0-10 subQ qid w/ meals  Renal: Foley plan: No foley   Skin-Wound: Routine wound care   Disposition: Stable on floor Will need 24hrs tolerating regular diet prior to discharge  Will follow up with Dr. Darral at [facility] on November 18th  Electronically signed by: Dena Johnnie Maffucci, MD 01/03/2024 7:01 AM

## 2024-01-05 ENCOUNTER — Telehealth: Payer: Self-pay

## 2024-01-05 NOTE — Telephone Encounter (Signed)
 S: post op question  B: s/p REPAIR HERNIA COMPONENT SEPARATION WITH MESH OPEN, PLACEMENT OF INCISIONAL WOUND VACAND JP DRAIN 12/24/23   A: Pt's wife LVM on [facility] triage line stating she is unsure what kind of solution she is supposed to be using for pt's surgical wound care.  R: Spoke with pt's wife, 2 pt identifiers verified, and advised to use normal saline to pack wound with. Pt's wife verbalized understanding.

## 2024-01-05 NOTE — Transitions of Care (Post Inpatient/ED Visit) (Signed)
 01/05/2024  Name: Richard TIERCE MRN: [id number] DOB: 1956/01/13  Today's TOC FU Call Status: Today's TOC FU Call Status:: Successful TOC FU Call Completed TOC FU Call Complete Date: 01/05/24 Patient's Name and Date of Birth confirmed.  Transition Care Management Follow-up Telephone Call Date of Discharge: 01/04/24 Discharge Facility: Other [facility]) Name of Other ([facility]) Discharge Facility: [name] Type of Discharge: Inpatient Admission Primary Inpatient Discharge Diagnosis:: Crohn's colitis, other complication How have you been since you were released from the hospital?: Better (Reports that he was able to sleep well last night.) Any questions or concerns?: No  Items Reviewed: Did you receive and understand the discharge instructions provided?: Yes Medications obtained,verified, and reconciled?: Yes (Medications Reviewed) Any new allergies since your discharge?: No Dietary orders reviewed?: Yes Type of Diet Ordered:: advance as tolerated. currently eating soft foods. Do you have support at home?: Yes People in Home [RPT]: spouse  Medications Reviewed Today: Medications Reviewed Today     Reviewed by Rumalda Alan PENNER, RN (Registered Nurse) on 01/05/24 at 1038  Med List Status: <None>   Medication Order Taking? Sig Documenting Provider Last Dose Status Informant  Accu-Chek Softclix Lancets lancets [id number] Yes SMARTSIG:Topical McGowen, Aleene VEAR, MD  Active Self  acetaminophen  (TYLENOL ) 325 MG tablet [id number] Yes Take 2 tablets (650 mg total) by mouth every 4 (four) hours as needed for mild pain, moderate pain, fever or headache. Shalhoub, Zachary PARAS, MD  Active Self  ascorbic acid  (VITAMIN C ) 500 MG tablet [id number] Yes Take 1 tablet (500 mg total) by mouth 2 (two) times daily. Kenard Zachary PARAS, MD  Active Self  aspirin EC 81 MG tablet [id number] Yes Take 81 mg by mouth daily. Swallow whole. [provider]  Active Self  atorvastatin  (LIPITOR) 40 MG  tablet [id number] Yes Take 1 tablet (40 mg total) by mouth daily. Candise Aleene VEAR, MD  Active   Continuous Blood Gluc Receiver (FREESTYLE LIBRE 3 READER) [place] [id number] Yes 1 each by Does not apply route every 14 (fourteen) days. Candise Aleene VEAR, MD  Active Self  Continuous Glucose Sensor (FREESTYLE LIBRE 3 SENSOR) [place] [id number] Yes Place 1 sensor on the skin every 14 days. Use to check glucose continuously McGowen, Aleene VEAR, MD  Active Self  ferrous sulfate  325 (65 FE) MG EC tablet [id number] Yes Take 325 mg by mouth daily with breakfast. [provider]  Active Self  glucose blood (ACCU-CHEK GUIDE) test strip [id number] Yes 1 each by Other route 4 (four) times daily -  before meals and at bedtime. McGowen, Philip H, MD  Active   magnesium  oxide (MAG-OX) 400 (240 Mg) MG tablet [id number] Yes TAKE 2 TABLETS (800 MG TOTAL) BY MOUTH 3 (THREE) TIMES DAILY.  Patient taking differently: Take 800 mg by mouth 2 (two) times daily.   McGowen, Philip H, MD  Active Self  metFORMIN  (GLUCOPHAGE ) 1000 MG tablet [id number] Yes TAKE 1 TABLET (1,000 MG TOTAL) BY MOUTH TWICE A DAY WITH FOOD  Patient taking differently: 500 mg 2 (two) times daily with a meal.   McGowen, Aleene VEAR, MD  Active Self  methylphenidate  (METADATE  ER) 20 MG ER tablet [id number] Yes Take 1 tablet (20 mg total) by mouth daily. McGowen, Philip H, MD  Active   Multiple Vitamin (MULTIVITAMIN WITH MINERALS) TABS tablet [id number] Yes Take 1 tablet by mouth daily. Shalhoub, Zachary PARAS, MD  Active Self  Risankizumab-rzaa [facility]) 360 MG/2.4ML [place] [id number] Yes Inject into the skin. Every eight weeks [provider]  Active Self  sildenafil  (VIAGRA ) 100 MG tablet [id number] Yes Take 1 tablet (100 mg total) by mouth daily as needed for erectile dysfunction. McGowen, Philip H, MD  Active Self  thiamine  (VITAMIN B-1) 100 MG tablet [id number] Yes Take 1 tablet (100 mg total) by mouth daily. Shalhoub, Zachary PARAS, MD  Active Self  zinc  sulfate 220  (50 Zn) MG capsule [id number] Yes Take 1 capsule (220 mg total) by mouth daily. Maurice Sharlet RAMAN, PA-C  Active Self            Home Care and Equipment/Supplies: Were Home Health Services Ordered?: No Any new equipment or medical supplies ordered?: No  Functional Questionnaire: Do you need assistance with bathing/showering or dressing?: No Do you need assistance with meal preparation?: No Do you need assistance with eating?: No Do you have difficulty maintaining continence: No Do you need assistance with getting out of bed/getting out of a chair/moving?: No Do you have difficulty managing or taking your medications?: No  Follow up appointments reviewed: PCP Follow-up appointment confirmed?: No (Reviewed importance of hospital follow up.  Patient will call later today to make an appointment,) MD Provider Line [phone] Given: No Specialist Hospital Follow-up appointment confirmed?: Yes Date of Specialist follow-up appointment?: 01/22/24 Follow-Up Specialty Provider:: Dr. Darral- surgeon Do you need transportation to your follow-up appointment?: No (wife is driving) Do you understand care options if your condition(s) worsen?: Yes-patient verbalized understanding  SDOH Interventions Today    Flowsheet Row Most Recent Value  SDOH Interventions   Food Insecurity Interventions Intervention Not Indicated  Housing Interventions Intervention Not Indicated  Transportation Interventions Intervention Not Indicated  Utilities Interventions Intervention Not Indicated   Patient reports that he is doing well. Reports that he works remotely and is planning to try to work some today.  Reports bowels are moving well . Reports that he is planning to advance his diet as he can. Reports that he has all his medications and is taking his medications as prescribed.  Denies any signs of infections: Interventions: Reviewed need for PCP hospital follow up. Reviewed follow up with surgeon.  Reviewed  signs of infection and when to call MD. Reviewed diet and tolerance Reviewed medications Reviewed bowels and output. Discussed weight limit of 10 pounds.  Reviewed DM and recent A1c.  Reviewed dressing changes and wife is completing these.  Denies any additional questions. Reviewed and offered 30 day TOC program and patient has declined. Encouraged patient to call me if needed.  Provided my contact information.   Alan Ee, RN, BSN, CEN [employer]- Transition of Care Team.  [facility] [phone]

## 2024-01-09 NOTE — Progress Notes (Signed)
 Appt cancelled

## 2024-01-09 NOTE — Patient Instructions (Signed)
 Health Maintenance, Male  Adopting a healthy lifestyle and getting preventive care are important in promoting health and wellness. Ask your health care provider about:  The right schedule for you to have regular tests and exams.  Things you can do on your own to prevent diseases and keep yourself healthy.  What should I know about diet, weight, and exercise?  Eat a healthy diet    Eat a diet that includes plenty of vegetables, fruits, low-fat dairy products, and lean protein.  Do not eat a lot of foods that are high in solid fats, added sugars, or sodium.  Maintain a healthy weight  Body mass index (BMI) is a measurement that can be used to identify possible weight problems. It estimates body fat based on height and weight. Your health care provider can help determine your BMI and help you achieve or maintain a healthy weight.  Get regular exercise  Get regular exercise. This is one of the most important things you can do for your health. Most adults should:  Exercise for at least 150 minutes each week. The exercise should increase your heart rate and make you sweat (moderate-intensity exercise).  Do strengthening exercises at least twice a week. This is in addition to the moderate-intensity exercise.  Spend less time sitting. Even light physical activity can be beneficial.  Watch cholesterol and blood lipids  Have your blood tested for lipids and cholesterol at 67 years of age, then have this test every 5 years.  You may need to have your cholesterol levels checked more often if:  Your lipid or cholesterol levels are high.  You are older than 67 years of age.  You are at high risk for heart disease.  What should I know about cancer screening?  Many types of cancers can be detected early and may often be prevented. Depending on your health history and family history, you may need to have cancer screening at various ages. This may include screening for:  Colorectal cancer.  Prostate cancer.  Skin cancer.  Lung  cancer.  What should I know about heart disease, diabetes, and high blood pressure?  Blood pressure and heart disease  High blood pressure causes heart disease and increases the risk of stroke. This is more likely to develop in people who have high blood pressure readings or are overweight.  Talk with your health care provider about your target blood pressure readings.  Have your blood pressure checked:  Every 3-5 years if you are 67-67 years of age.  Every year if you are 3 years old or older.  If you are between the ages of 60 and 72 and are a current or former smoker, ask your health care provider if you should have a one-time screening for abdominal aortic aneurysm (AAA).  Diabetes  Have regular diabetes screenings. This checks your fasting blood sugar level. Have the screening done:  Once every three years after age 67 if you are at a normal weight and have a low risk for diabetes.  More often and at a younger age if you are overweight or have a high risk for diabetes.  What should I know about preventing infection?  Hepatitis B  If you have a higher risk for hepatitis B, you should be screened for this virus. Talk with your health care provider to find out if you are at risk for hepatitis B infection.  Hepatitis C  Blood testing is recommended for:  Everyone born from [birth year] through 1964.  Anyone  with known risk factors for hepatitis C.  Sexually transmitted infections (STIs)  You should be screened each year for STIs, including gonorrhea and chlamydia, if:  You are sexually active and are younger than 67 years of age.  You are older than 67 years of age and your health care provider tells you that you are at risk for this type of infection.  Your sexual activity has changed since you were last screened, and you are at increased risk for chlamydia or gonorrhea. Ask your health care provider if you are at risk.  Ask your health care provider about whether you are at high risk for HIV. Your health care provider  may recommend a prescription medicine to help prevent HIV infection. If you choose to take medicine to prevent HIV, you should first get tested for HIV. You should then be tested every 3 months for as long as you are taking the medicine.  Follow these instructions at home:  Alcohol use  Do not drink alcohol if your health care provider tells you not to drink.  If you drink alcohol:  Limit how much you have to 0-2 drinks a day.  Know how much alcohol is in your drink. In the [place] one drink equals one 12 oz bottle of beer (355 mL), one 5 oz glass of wine (148 mL), or one 1 oz glass of hard liquor (44 mL).  Lifestyle  Do not use any products that contain nicotine or tobacco. These products include cigarettes, chewing tobacco, and vaping devices, such as e-cigarettes. If you need help quitting, ask your health care provider.  Do not use street drugs.  Do not share needles.  Ask your health care provider for help if you need support or information about quitting drugs.  General instructions  Schedule regular health, dental, and eye exams.  Stay current with your vaccines.  Tell your health care provider if:  You often feel depressed.  You have ever been abused or do not feel safe at home.  Summary  Adopting a healthy lifestyle and getting preventive care are important in promoting health and wellness.  Follow your health care provider's instructions about healthy diet, exercising, and getting tested or screened for diseases.  Follow your health care provider's instructions on monitoring your cholesterol and blood pressure.  This information is not intended to replace advice given to you by your health care provider. Make sure you discuss any questions you have with your health care provider.  Document Revised: 07/24/2020 Document Reviewed: 07/24/2020  Elsevier Patient Education  2024 [employer].

## 2024-01-12 ENCOUNTER — Encounter: Admitting: Family Medicine

## 2024-01-12 DIAGNOSIS — Z0000 Encounter for general adult medical examination without abnormal findings: Secondary | ICD-10-CM

## 2024-01-26 ENCOUNTER — Ambulatory Visit (INDEPENDENT_AMBULATORY_CARE_PROVIDER_SITE_OTHER): Admitting: Family Medicine

## 2024-01-26 ENCOUNTER — Ambulatory Visit (INDEPENDENT_AMBULATORY_CARE_PROVIDER_SITE_OTHER)

## 2024-01-26 ENCOUNTER — Encounter: Payer: Self-pay | Admitting: Family Medicine

## 2024-01-26 VITALS — BP 94/57 | HR 87 | Wt 204.4 lb

## 2024-01-26 VITALS — BP 94/57 | HR 87 | Ht 70.0 in | Wt 204.4 lb

## 2024-01-26 DIAGNOSIS — R809 Proteinuria, unspecified: Secondary | ICD-10-CM

## 2024-01-26 DIAGNOSIS — R197 Diarrhea, unspecified: Secondary | ICD-10-CM

## 2024-01-26 DIAGNOSIS — E7800 Pure hypercholesterolemia, unspecified: Secondary | ICD-10-CM

## 2024-01-26 DIAGNOSIS — Z0000 Encounter for general adult medical examination without abnormal findings: Secondary | ICD-10-CM | POA: Diagnosis not present

## 2024-01-26 DIAGNOSIS — E1129 Type 2 diabetes mellitus with other diabetic kidney complication: Secondary | ICD-10-CM

## 2024-01-26 DIAGNOSIS — F988 Other specified behavioral and emotional disorders with onset usually occurring in childhood and adolescence: Secondary | ICD-10-CM | POA: Diagnosis not present

## 2024-01-26 DIAGNOSIS — D721 Eosinophilia, unspecified: Secondary | ICD-10-CM | POA: Diagnosis not present

## 2024-01-26 DIAGNOSIS — Z7984 Long term (current) use of oral hypoglycemic drugs: Secondary | ICD-10-CM

## 2024-01-26 LAB — COMPREHENSIVE METABOLIC PANEL WITH GFR
ALT: 8 U/L (ref 0–53)
AST: 10 U/L (ref 0–37)
Albumin: 3.4 g/dL — ABNORMAL LOW (ref 3.5–5.2)
Alkaline Phosphatase: 79 U/L (ref 39–117)
BUN: 10 mg/dL (ref 6–23)
CO2: 27 meq/L (ref 19–32)
Calcium: 9.1 mg/dL (ref 8.4–10.5)
Chloride: 97 meq/L (ref 96–112)
Creatinine, Ser: 0.49 mg/dL (ref 0.40–1.50)
GFR: 105.96 mL/min (ref >60.00)
Glucose, Bld: 106 mg/dL — ABNORMAL HIGH (ref 70–99)
Potassium: 4.2 meq/L (ref 3.5–5.1)
Sodium: 135 meq/L (ref 135–145)
Total Bilirubin: 0.5 mg/dL (ref 0.2–1.2)
Total Protein: 7.3 g/dL (ref 6.0–8.3)

## 2024-01-26 LAB — CBC WITH DIFFERENTIAL/PLATELET
Basophils Absolute: 0.1 K/uL (ref 0.0–0.1)
Basophils Relative: 0.5 % (ref 0.0–3.0)
Eosinophils Absolute: 0.1 K/uL (ref 0.0–0.7)
Eosinophils Relative: 1.1 % (ref 0.0–5.0)
HCT: 32.7 % — ABNORMAL LOW (ref 39.0–52.0)
Hemoglobin: 11 g/dL — ABNORMAL LOW (ref 13.0–17.0)
Lymphocytes Relative: 12.8 % (ref 12.0–46.0)
Lymphs Abs: 1.4 K/uL (ref 0.7–4.0)
MCHC: 33.6 g/dL (ref 30.0–36.0)
MCV: 94.7 fl (ref 78.0–100.0)
Monocytes Absolute: 0.8 K/uL (ref 0.1–1.0)
Monocytes Relative: 7.7 % (ref 3.0–12.0)
Neutro Abs: 8.5 K/uL — ABNORMAL HIGH (ref 1.4–7.7)
Neutrophils Relative %: 77.9 % — ABNORMAL HIGH (ref 43.0–77.0)
Platelets: 427 K/uL — ABNORMAL HIGH (ref 150.0–400.0)
RBC: 3.45 Mil/uL — ABNORMAL LOW (ref 4.22–5.81)
RDW: 13.7 % (ref 11.5–15.5)
WBC: 10.9 K/uL — ABNORMAL HIGH (ref 4.0–10.5)

## 2024-01-26 LAB — MICROALBUMIN / CREATININE URINE RATIO
Creatinine,U: 268.8 mg/dL
Microalb Creat Ratio: 38.2 mg/g — ABNORMAL HIGH (ref 0.0–30.0)
Microalb, Ur: 10.3 mg/dL — ABNORMAL HIGH (ref 0.0–1.9)

## 2024-01-26 LAB — POCT GLYCOSYLATED HEMOGLOBIN (HGB A1C)
HbA1c POC (<> result, manual entry): 5.8 % (ref 4.0–5.6)
HbA1c, POC (controlled diabetic range): 5.8 % (ref 0.0–7.0)
HbA1c, POC (prediabetic range): 5.8 % (ref 5.7–6.4)
Hemoglobin A1C: 5.8 % — AB (ref 4.0–5.6)

## 2024-01-26 MED ORDER — METHYLPHENIDATE HCL ER 20 MG PO TBCR: 20.0000 mg | EXTENDED_RELEASE_TABLET | Freq: Every day | ORAL | 0 refills | Status: AC

## 2024-01-26 NOTE — Progress Notes (Signed)
 OFFICE VISIT  01/26/2024  CC: No chief complaint on file.   Patient is a 68 y.o. male who presents for 55-month follow-up diabetes, adult ADD, hypercholesterolemia, and eosinophilia. A/P as of last visit: #1 diabetes with history of microalbuminuria. Excellent control with metformin  500 mg twice a day.  His hemoglobin A1c today is 5.6%. He will continue to try to slowly decrease his metformin  as he monitors home glucoses. Feet exam today normal. Basic metabolic panel today.   #2 Eosinophilia. We have detected this on routine blood work 4/21, 5/21, and 5/30, 2025 (range has been 11-15%). Unknown etiology. The first elevation was after he got on Skyrizi. He asked his gastroenterologist if Nelma could do this and he was told that it is unlikely but possible. He has no signs/symptoms of parasitic infection or significant allergic disease. Repeat CBC with differential today.   #3 adult ADD. He has done well long-term on Metadate  ER 20 mg a day on an as needed basis. #30 prescribed today.   #4 hypercholesterolemia, doing well on atorvastatin  40 mg a day. LDL was 50 approximately 3 months ago. Plan repeat lipids in 3 months.   #5 Crohn's disease in remission on Skyrizi. History of descending and sigmoid colon resection, colostomy. Complicated by large ventral hernia. Fortunately with the help of the wound care clinic the abdominal wound over the ventral hernia region has healed. He is set up for ventral hernia repair and colostomy takedown on September 10th. He has a history of hypophosphatemia and hypomagnesemia.  He is on magnesium  supplement (Mag-Ox 400 mg tabs, 2 tabs twice a day). Check magnesium  and phosphorus levels today.  INTERIM HX: He had his hernia repair and colostomy closure surgery on 12/24/2023. He stayed in the hospital 2 weeks, no significant complications. He is still pretty tired and also has about 4 watery bowel movements a day.  He has no fecal  incontinence. No blood in stool, no fever, no abdominal pain.    ADD: Pt states all is going well with the med at current dosing (Metadate  ER 20 mg once a day as needed): much improved focus, concentration, task completion.  Less frustration, better multitasking, less impulsivity and restlessness.  Mood is stable. No side effects from the medication.   PMP AWARE reviewed today: most recent rx for methylphenidate  ER was filled 10/24/2023, # 30, rx by me. No red flags.  Past Medical History:  Diagnosis Date   Adult ADHD    Allergic rhinitis    Colon polyps 2012 and 2013   All hyperplastic except one adenomatous w/out high grade dysplasia (recall 2023 per Dr. Debrah)   Colostomy in place [facility]) 01/2022   COVID-19 virus infection 09/29/2020   Crohn disease (HCC)    Follows w/ [facility]. s/p left colectomy w/ colostomy in 2023   Elevated transaminase level 2017   Viral hep screening neg.  Suspect fatty liver.   Erectile dysfunction    History of pneumonia    History of retinal tear    11/2022   Hyperlipidemia    Hypertension    hx of   Large bowel perforation (HCC) 02/2022   delayed, s/p colonoscopy w/biopsies (new dx crohn's/inflammatory BD)   Obesity    OSA on CPAP    Severe.  CPAP 10 cm H2O.  Follows [facility].   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 Debby did surg   Peripheral neuropathy    feet   Pneumonia    several years ago as  of 04/29/23   Rectal varices    +rectal bleed while in hosp 2023   Right leg DVT (HCC)    04/12/22 R fem and pop, treated for 3 months with Eliquis    Seborrheic dermatitis of scalp    and face: hydrocort  2% cream bid prn to face, and fluocinonide 0.5% sol'n to scalp bid prn per Dr. Ivin (04/2014)   Secondary male hypogonadism 04/2021   Type 2 diabetes mellitus with microalbuminuria (HCC) DM dx-09/27/2015. Microalb 2020   Follows w/ PCP, Dr. Manus Hockey.   Ventral hernia    large   Wears  glasses     Past Surgical History:  Procedure Laterality Date   COLECTOMY Left 01/2022   COLONOSCOPY     03/2023: Normal terminal ileum and normal colon above the colostomy.   COLONOSCOPY W/ POLYPECTOMY  04/2010; 10/2011   Initial screening TCS showed 6 polyps (one adenomatous).  Pt says he returned for repeat TCS 10/2011 showed one hyperplastic polyp: Recall 10 yrs per Dr. Debrah   EYE SURGERY     Laser surg L eye for retinal tear 11/16/22   FLEXIBLE SIGMOIDOSCOPY N/A 02/25/2022   Procedure: FLEXIBLE SIGMOIDOSCOPY;  Surgeon: Wilhelmenia Aloha Raddle., MD;  Location: [facility];  Service: Gastroenterology;  Laterality: N/A;   FLEXIBLE SIGMOIDOSCOPY N/A 03/04/2022   Procedure: FLEXIBLE SIGMOIDOSCOPY;  Surgeon: San Sandor GAILS, DO;  Location: [facility];  Service: Gastroenterology;  Laterality: N/A;   HEMOSTASIS CONTROL  02/25/2022   Procedure: HEMOSTASIS CONTROL;  Surgeon: Wilhelmenia Aloha Raddle., MD;  Location: [facility];  Service: Gastroenterology;;   HEMOSTASIS CONTROL  03/04/2022   Procedure: HEMOSTASIS CONTROL;  Surgeon: San Sandor GAILS, DO;  Location: [facility];  Service: Gastroenterology;;   INCISION AND DRAINAGE ABSCESS N/A 11/07/2021   Procedure: Incision and drainage of scrotal abscess;  Surgeon: Debby Hila, MD;  Location: [facility];  Service: General;  Laterality: N/A;   IR SINUS/FIST TUBE CHK-NON GI  03/06/2022   LAPAROTOMY N/A 02/07/2022   Resection of descending and sigmoid colon with transverse colostomy.Procedure: EXPLORATORY LAPAROTOMY, COLON RESECTION, OSTOMY;  Surgeon: Debby Hila, MD;  Location: [facility];  Service: General;  Laterality: N/A;   MOUTH SURGERY     PLACEMENT OF SETON N/A 05/02/2023   Procedure: PLACEMENT OF SETON incison ad drainage of perirectal abcess;  Surgeon: Debby Hila, MD;  Location: [facility] [place];  Service: General;  Laterality: N/A;   RECTAL EXAM UNDER ANESTHESIA N/A 05/02/2023   Procedure: RECTAL EXAM UNDER ANESTHESIA  INTERROGATION OF ANAL FISTULA;  Surgeon: Debby Hila, MD;  Location: [facility] ;  Service: General;  Laterality: N/A;   TRANSTHORACIC ECHOCARDIOGRAM     03/05/22 NORMAL, EF 60 - 65%    Outpatient Medications Prior to Visit  Medication Sig Dispense Refill   Accu-Chek Softclix Lancets lancets SMARTSIG:Topical 200 each 5   acetaminophen  (TYLENOL ) 325 MG tablet Take 2 tablets (650 mg total) by mouth every 4 (four) hours as needed for mild pain, moderate pain, fever or headache.     ascorbic acid  (VITAMIN C ) 500 MG tablet Take 1 tablet (500 mg total) by mouth 2 (two) times daily.     aspirin EC 81 MG tablet Take 81 mg by mouth daily. Swallow whole.     atorvastatin  (LIPITOR) 40 MG tablet Take 1 tablet (40 mg total) by mouth daily. 90 tablet 3   ferrous sulfate  325 (65 FE) MG EC tablet Take 325 mg by mouth daily with breakfast.     glucose blood (  ACCU-CHEK GUIDE) test strip 1 each by Other route 4 (four) times daily -  before meals and at bedtime. 300 strip 5   metFORMIN  (GLUCOPHAGE ) 1000 MG tablet TAKE 1 TABLET (1,000 MG TOTAL) BY MOUTH TWICE A DAY WITH FOOD (Patient taking differently: 500 mg 2 (two) times daily with a meal.) 180 tablet 3   methylphenidate  (METADATE  ER) 20 MG ER tablet Take 1 tablet (20 mg total) by mouth daily. 30 tablet 0   Multiple Vitamin (MULTIVITAMIN WITH MINERALS) TABS tablet Take 1 tablet by mouth daily.     Risankizumab-rzaa (SKYRIZI) 360 MG/2.4ML SOCT Inject into the skin. Every eight weeks     sildenafil  (VIAGRA ) 100 MG tablet Take 1 tablet (100 mg total) by mouth daily as needed for erectile dysfunction. 30 tablet 6   thiamine  (VITAMIN B-1) 100 MG tablet Take 1 tablet (100 mg total) by mouth daily.     zinc  sulfate 220 (50 Zn) MG capsule Take 1 capsule (220 mg total) by mouth daily. 30 capsule 0   Continuous Blood Gluc Receiver (FREESTYLE LIBRE 3 READER) DEVI 1 each by Does not apply route every 14 (fourteen) days. (Patient not taking: Reported on  01/26/2024) 2 each 2   Continuous Glucose Sensor (FREESTYLE LIBRE 3 SENSOR) MISC Place 1 sensor on the skin every 14 days. Use to check glucose continuously (Patient not taking: Reported on 01/26/2024) 2 each 2   erythromycin base (E-MYCIN) 500 MG tablet Take by mouth.     ketoconazole  (NIZORAL ) 2 % cream Apply topically daily.     magnesium  oxide (MAG-OX) 400 (240 Mg) MG tablet TAKE 2 TABLETS (800 MG TOTAL) BY MOUTH 3 (THREE) TIMES DAILY. (Patient taking differently: Take 800 mg by mouth 2 (two) times daily.) 180 tablet 1   neomycin (MYCIFRADIN) 500 MG tablet Take by mouth.     No facility-administered medications prior to visit.    Allergies  Allergen Reactions   Lobster [Shellfish Allergy] Nausea Only   Poison Ivy Extract Itching    Review of Systems As per HPI  PE:    01/26/2024   10:30 AM 01/26/2024   10:19 AM 10/24/2023    1:19 PM  Vitals with BMI  Height 5' 10  5' 10  Weight 204 lbs 6 oz 204 lbs 6 oz 219 lbs 3 oz  BMI 29.33  31.45  Systolic 94 94 119  Diastolic 57 57 64  Pulse 87 87 66     Physical Exam  Gen: Alert, well appearing.  Patient is oriented to person, place, time, and situation. AFFECT: pleasant, lucid thought and speech. CV: RRR, no m/r/g.   LUNGS: CTA bilat, nonlabored resps, good aeration in all lung fields. ABD: soft, NT/ND EXT: no clubbing or cyanosis.  no edema.    LABS:  Last CBC Lab Results  Component Value Date   WBC 8.2 10/24/2023   HGB 14.9 10/24/2023   HCT 43.9 10/24/2023   MCV 97.8 10/24/2023   MCH 33.2 (H) 10/24/2023   RDW 12.6 10/24/2023   PLT 229 10/24/2023   Lab Results  Component Value Date   IRON 26 (L) 03/28/2022   TIBC 197 (L) 03/28/2022   FERRITIN 114 03/28/2022   Last metabolic panel Lab Results  Component Value Date   GLUCOSE 102 (H) 10/24/2023   NA 141 10/24/2023   K 4.3 10/24/2023   CL 104 10/24/2023   CO2 27 10/24/2023   BUN 15 10/24/2023   CREATININE 0.56 (L) 10/24/2023   EGFR 108 10/24/2023  CALCIUM  9.6 10/24/2023   PHOS 3.4 10/24/2023   PROT 7.8 07/07/2023   ALBUMIN  4.4 07/07/2023   BILITOT 0.6 07/07/2023   ALKPHOS 79 07/07/2023   AST 15 07/07/2023   ALT 14 07/07/2023   ANIONGAP 7 04/12/2022   Last lipids Lab Results  Component Value Date   CHOL 121 07/07/2023   HDL 56.90 07/07/2023   LDLCALC 50 07/07/2023   LDLDIRECT 139.9 06/13/2011   TRIG 68.0 07/07/2023   CHOLHDL 2 07/07/2023   Last hemoglobin A1c Lab Results  Component Value Date   HGBA1C 5.8 (A) 01/26/2024   HGBA1C 5.8 01/26/2024   HGBA1C 5.8 01/26/2024   HGBA1C 5.8 01/26/2024   Last thyroid  functions Lab Results  Component Value Date   TSH 2.58 03/10/2019   Last vitamin D  Lab Results  Component Value Date   VD25OH 35.02 07/07/2023   Last vitamin B12 and Folate Lab Results  Component Value Date   VITAMINB12 614 11/28/2022   FOLATE 7.0 12/13/2019      IMPRESSION AND PLAN:  #1 diabetes with history of mild microalbuminuria. Excellent control with metformin  500 mg twice a day.  His hemoglobin A1c today is 5.8%. Monitor renal function today. Urine microalbumin/creatinine today.   #2 Eosinophilia. We have detected this on routine blood work 4/21, 5/21, and 5/30, and 8/8, 2025. The most recent check it was trending down. Unknown etiology. The first elevation was after he got on Skyrizi. He asked his gastroenterologist if Nelma could do this and he was told that it is unlikely but possible. He has no signs/symptoms of parasitic infection or significant allergic disease. Repeat CBC with differential today.   #3 adult ADD. He has done well long-term on Metadate  ER 20 mg a day on an as needed basis. #30 prescribed today.   #4 hypercholesterolemia, doing well on atorvastatin  40 mg a day. LDL was 50 approximately 6 months ago. Plan repeat lipids in 3 months.   #5 Crohn's disease in remission on Skyrizi. History of descending and sigmoid colon resection, colostomy. Complicated by large  ventral hernia. Fortunately with the help of the wound care clinic the abdominal wound over the ventral hernia region has healed. He is doing well now status post ventral hernia repair and colostomy takedown surgery approximately 1 month ago. He does have loose stools still.  He has follow-up with his gastroenterologist in 4 days. Encouraged him to start a once a day dose of Metamucil. Monitor complete metabolic panel today.  An After Visit Summary was printed and given to the patient.  FOLLOW UP: No follow-ups on file. Next cpe 06/2024 Signed:  Gerlene Hockey, MD           01/26/2024

## 2024-01-26 NOTE — Progress Notes (Signed)
 Subjective:   Richard [name] is a 68 y.o. male who presents for a Medicare Annual Wellness Visit.  Allergies (verified) Lobster [shellfish allergy] and Poison ivy extract   History: Past Medical History:  Diagnosis Date   Adult ADHD    Allergic rhinitis    Colon polyps 2012 and 2013   All hyperplastic except one adenomatous w/out high grade dysplasia (recall 2023 per Dr. Debrah)   Colostomy in place [facility]) 01/2022   COVID-19 virus infection 09/29/2020   Crohn disease (HCC)    Follows w/ [facility]. s/p left colectomy w/ colostomy in 2023   Elevated transaminase level 2017   Viral hep screening neg.  Suspect fatty liver.   Erectile dysfunction    History of pneumonia    History of retinal tear    11/2022   Hyperlipidemia    Hypertension    hx of   Large bowel perforation (HCC) 02/2022   delayed, s/p colonoscopy w/biopsies (new dx crohn's/inflammatory BD)   Obesity    OSA on CPAP    Severe.  CPAP 10 cm H2O.  Follows [facility].   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 Debby did surg   Peripheral neuropathy    feet   Pneumonia    several years ago as of 04/29/23   Rectal varices    +rectal bleed while in hosp 2023   Right leg DVT (HCC)    04/12/22 R fem and pop, treated for 3 months with Eliquis    Seborrheic dermatitis of scalp    and face: hydrocort  2% cream bid prn to face, and fluocinonide 0.5% sol'n to scalp bid prn per Dr. Ivin (04/2014)   Secondary male hypogonadism 04/2021   Type 2 diabetes mellitus with microalbuminuria (HCC) DM dx-09/27/2015. Microalb 2020   Follows w/ PCP, Dr. Manus Hockey.   Ventral hernia    large   Wears glasses    Past Surgical History:  Procedure Laterality Date   COLECTOMY Left 01/2022   COLONOSCOPY     03/2023: Normal terminal ileum and normal colon above the colostomy.   COLONOSCOPY W/ POLYPECTOMY  04/2010; 10/2011   Initial screening TCS showed 6 polyps (one adenomatous).  Pt says he  returned for repeat TCS 10/2011 showed one hyperplastic polyp: Recall 10 yrs per Dr. Debrah   EYE SURGERY     Laser surg L eye for retinal tear 11/16/22   FLEXIBLE SIGMOIDOSCOPY N/A 02/25/2022   Procedure: FLEXIBLE SIGMOIDOSCOPY;  Surgeon: Wilhelmenia Aloha Raddle., MD;  Location: [facility];  Service: Gastroenterology;  Laterality: N/A;   FLEXIBLE SIGMOIDOSCOPY N/A 03/04/2022   Procedure: FLEXIBLE SIGMOIDOSCOPY;  Surgeon: San Sandor GAILS, DO;  Location: [facility];  Service: Gastroenterology;  Laterality: N/A;   HEMOSTASIS CONTROL  02/25/2022   Procedure: HEMOSTASIS CONTROL;  Surgeon: Wilhelmenia Aloha Raddle., MD;  Location: [facility];  Service: Gastroenterology;;   HEMOSTASIS CONTROL  03/04/2022   Procedure: HEMOSTASIS CONTROL;  Surgeon: San Sandor GAILS, DO;  Location: [facility];  Service: Gastroenterology;;   INCISION AND DRAINAGE ABSCESS N/A 11/07/2021   Procedure: Incision and drainage of scrotal abscess;  Surgeon: Debby Hila, MD;  Location: [facility];  Service: General;  Laterality: N/A;   IR SINUS/FIST TUBE CHK-NON GI  03/06/2022   LAPAROTOMY N/A 02/07/2022   Resection of descending and sigmoid colon with transverse colostomy.Procedure: EXPLORATORY LAPAROTOMY, COLON RESECTION, OSTOMY;  Surgeon: Debby Hila, MD;  Location: [facility];  Service: General;  Laterality: N/A;   MOUTH SURGERY  PLACEMENT OF SETON N/A 05/02/2023   Procedure: PLACEMENT OF SETON incison ad drainage of perirectal abcess;  Surgeon: Debby Hila, MD;  Location: [facility] [place];  Service: General;  Laterality: N/A;   RECTAL EXAM UNDER ANESTHESIA N/A 05/02/2023   Procedure: RECTAL EXAM UNDER ANESTHESIA INTERROGATION OF ANAL FISTULA;  Surgeon: Debby Hila, MD;  Location: [facility] [place];  Service: General;  Laterality: N/A;   TRANSTHORACIC ECHOCARDIOGRAM     03/05/22 NORMAL, EF 60 - 65%   Family History  Problem Relation Age of Onset   Lung cancer Father    Cancer Father         nasal   Allergies Father    Colon cancer Neg Hx    Esophageal cancer Neg Hx    Rectal cancer Neg Hx    Stomach cancer Neg Hx    Social History   Occupational History   Occupation: special educational needs teacher  Tobacco Use   Smoking status: Former    Current packs/day: 0.00    Types: Cigarettes    Quit date: 07/17/2002    Years since quitting: 21.5   Smokeless tobacco: Never  Vaping Use   Vaping status: Never Used  Substance and Sexual Activity   Alcohol use: Not Currently    Comment: about 1 drink per month   Drug use: No   Sexual activity: Yes    Birth control/protection: None   Tobacco Counseling Counseling given: Not Answered  SDOH Screenings   Food Insecurity: No Food Insecurity (01/26/2024)  Housing: Unknown (01/26/2024)  Transportation Needs: No Transportation Needs (01/26/2024)  Utilities: Not At Risk (01/26/2024)  Alcohol Screen: Low Risk  (03/02/2023)  Depression (PHQ2-9): Low Risk  (01/26/2024)  Financial Resource Strain: Low Risk  (03/02/2023)  Physical Activity: Insufficiently Active (01/26/2024)  Social Connections: Moderately Isolated (01/26/2024)  Stress: No Stress Concern Present (01/26/2024)  Tobacco Use: Medium Risk (01/26/2024)  Health Literacy: Adequate Health Literacy (01/26/2024)   Depression Screen    01/26/2024   11:13 AM 01/05/2024   10:44 AM 10/24/2023    1:33 PM 07/07/2023   10:52 AM 10/16/2022    1:39 PM 08/26/2022    1:39 PM 08/16/2022    4:11 PM  PHQ 2/9 Scores  PHQ - 2 Score 0 0 0 0 0 0 0  PHQ- 9 Score 0   0  0  11       Data saved with a previous flowsheet row definition     Goals Addressed               This Visit's Progress     Autogenerated Goal (pt-stated)        His goal is to retire which he plans to do in the next 3-4 months.       Visit info / Clinical Intake: Medicare Wellness Visit Type:: Subsequent Annual Wellness Visit Medicare Wellness Visit Mode:: In-person (required for WTM) Interpreter Needed?:  No Pre-visit prep was completed: no AWV questionnaire completed by patient prior to visit?: no Living arrangements:: lives with spouse/significant other Patient's Overall Health Status Rating: good Typical amount of pain: none Does pain affect daily life?: no Are you currently prescribed opioids?: no  Dietary Habits and Nutritional Risks How many meals a day?: 2 Eats fruit and vegetables daily?: (!) no Most meals are obtained by: preparing own meals In the last 2 weeks, have you had any of the following?: (!) nausea, vomiting, diarrhea Diabetic:: (!) yes Any non-healing wounds?: no How often do you check  your BS?: continuous glucose monitor Would you like to be referred to a Nutritionist or for Diabetic Management? : no  Functional Status Activities of Daily Living (to include ambulation/medication): Independent Ambulation: Independent Medication Administration: Independent Home Management: Independent Manage your own finances?: yes Primary transportation is: driving Concerns about vision?: no *vision screening is required for WTM* Concerns about hearing?: (!) yes Uses hearing aids?: no Hear whispered voice?: yes  Fall Screening Falls in the past year?: 0 Number of falls in past year: 0 Was there an injury with Fall?: 0 Fall Risk Category Calculator: 0 Patient Fall Risk Level: Low Fall Risk  Fall Risk Patient at Risk for Falls Due to: No Fall Risks Fall risk Follow up: Falls evaluation completed; Education provided; Falls prevention discussed  Home and Transportation Safety: All rugs have non-skid backing?: yes All stairs or steps have railings?: yes Grab bars in the bathtub or shower?: (!) no Have non-skid surface in bathtub or shower?: yes Good home lighting?: yes Regular seat belt use?: yes Hospital stays in the last year:: (!) yes How many hospital stays:: 1 Reason: surgery  Cognitive Assessment Difficulty concentrating, remembering, or making decisions? :  no Will 6CIT or Mini Cog be Completed: yes How many words correct?: 3 What year is it?: 0 points What month is it?: 0 points Give patient an address phrase to remember (5 components): Norleen Sharps [address] [place] About what time is it?: 0 points Count backwards from 20 to 1: 0 points Say the months of the year in reverse: 0 points Repeat the address phrase from earlier: 0 points 6 CIT Score: 0 points  Advance Directives (For Healthcare) Does Patient Have a Medical Advance Directive?: Yes Does patient want to make changes to medical advance directive?: No - Patient declined Type of Advance Directive: Healthcare Power of [place]; Out of facility DNR (pink MOST or yellow form); Living will Copy of Healthcare Power of Attorney in Chart?: No - copy requested Healthcare Power of Attorney Requested and Now in Chart: Copy in chart Copy of Living Will in Chart?: No - copy requested Living Will Requested and Now in Chart: Copy in chart Out of facility DNR (pink MOST or yellow form) in Chart? (Ambulatory ONLY): No - copy requested Pre-existing out of facility DNR order (yellow form or pink MOST form): Pink MOST/Yellow Form most recent copy in chart - Physician notified to receive inpatient order        Objective:    Today's Vitals   01/26/24 1030  BP: (!) 94/57  Pulse: 87  SpO2: 94%  Weight: 204 lb 6.4 oz (92.7 kg)  Height: 5' 10 (1.778 m)   Body mass index is 29.33 kg/m.  Current Medications (verified) Outpatient Encounter Medications as of 01/26/2024  Medication Sig   Accu-Chek Softclix Lancets lancets SMARTSIG:Topical   acetaminophen  (TYLENOL ) 325 MG tablet Take 2 tablets (650 mg total) by mouth every 4 (four) hours as needed for mild pain, moderate pain, fever or headache.   ascorbic acid  (VITAMIN C ) 500 MG tablet Take 1 tablet (500 mg total) by mouth 2 (two) times daily.   aspirin EC 81 MG tablet Take 81 mg by mouth daily. Swallow whole.   atorvastatin  (LIPITOR) 40 MG  tablet Take 1 tablet (40 mg total) by mouth daily.   ferrous sulfate  325 (65 FE) MG EC tablet Take 325 mg by mouth daily with breakfast.   glucose blood (ACCU-CHEK GUIDE) test strip 1 each by Other route 4 (four) times daily -  before meals and at bedtime.   metFORMIN  (GLUCOPHAGE ) 1000 MG tablet TAKE 1 TABLET (1,000 MG TOTAL) BY MOUTH TWICE A DAY WITH FOOD (Patient taking differently: 500 mg 2 (two) times daily with a meal.)   Multiple Vitamin (MULTIVITAMIN WITH MINERALS) TABS tablet Take 1 tablet by mouth daily.   Risankizumab-rzaa (SKYRIZI) 360 MG/2.4ML SOCT Inject into the skin. Every eight weeks   sildenafil  (VIAGRA ) 100 MG tablet Take 1 tablet (100 mg total) by mouth daily as needed for erectile dysfunction.   thiamine  (VITAMIN B-1) 100 MG tablet Take 1 tablet (100 mg total) by mouth daily.   zinc  sulfate 220 (50 Zn) MG capsule Take 1 capsule (220 mg total) by mouth daily.   [DISCONTINUED] methylphenidate  (METADATE  ER) 20 MG ER tablet Take 1 tablet (20 mg total) by mouth daily.   Continuous Blood Gluc Receiver (FREESTYLE LIBRE 3 READER) DEVI 1 each by Does not apply route every 14 (fourteen) days. (Patient not taking: Reported on 01/26/2024)   Continuous Glucose Sensor (FREESTYLE LIBRE 3 SENSOR) MISC Place 1 sensor on the skin every 14 days. Use to check glucose continuously (Patient not taking: Reported on 01/26/2024)   [DISCONTINUED] erythromycin base (E-MYCIN) 500 MG tablet Take by mouth.   [DISCONTINUED] ketoconazole  (NIZORAL ) 2 % cream Apply topically daily.   [DISCONTINUED] magnesium  oxide (MAG-OX) 400 (240 Mg) MG tablet TAKE 2 TABLETS (800 MG TOTAL) BY MOUTH 3 (THREE) TIMES DAILY. (Patient taking differently: Take 800 mg by mouth 2 (two) times daily.)   [DISCONTINUED] neomycin (MYCIFRADIN) 500 MG tablet Take by mouth.   No facility-administered encounter medications on file as of 01/26/2024.   Hearing/Vision screen No results found. Immunizations and Health Maintenance Health  Maintenance  Topic Date Due   OPHTHALMOLOGY EXAM  10/28/2023   COVID-19 Vaccine (5 - Pfizer risk 2025-26 season) 06/10/2024   HEMOGLOBIN A1C  07/25/2024   Diabetic kidney evaluation - eGFR measurement  10/23/2024   Diabetic kidney evaluation - Urine ACR  10/23/2024   FOOT EXAM  10/23/2024   Medicare Annual Wellness (AWV)  01/25/2025   DTaP/Tdap/Td (3 - Td or Tdap) 07/12/2030   Colonoscopy  04/08/2033   Pneumococcal Vaccine: 50+ Years  Completed   Influenza Vaccine  Completed   Hepatitis C Screening  Completed   Zoster Vaccines- Shingrix   Completed   Meningococcal B Vaccine  Aged Out        Assessment/Plan:  This is a routine wellness examination for Abdulahi.  Patient Care Team: Candise Aleene DEL, MD as PCP - General (Family Medicine) Santo Stanly LABOR, MD as PCP - Cardiology (Cardiology) Claudene Arthea HERO, DO as Consulting Physician (Sports Medicine) Ivin Kocher, MD as Consulting Physician (Dermatology) Abigail, Maude POUR (Optometry) Patel, Donika K, DO as Consulting Physician (Neurology) Maczis, Puja Gosai, PA-C as Physician Assistant (General Surgery) Debby Hila, MD as Consulting Physician (General Surgery) Valere Carlin SQUIBB, MD as Consulting Physician (Ophthalmology) Harlee, Charlie Dale Raddle., MD as Consulting Physician (Gastroenterology)  I have personally reviewed and noted the following in the patient's chart:   Medical and social history Use of alcohol, tobacco or illicit drugs  Current medications and supplements including opioid prescriptions. Functional ability and status Nutritional status Physical activity Advanced directives List of other physicians Hospitalizations, surgeries, and ER visits in previous 12 months Vitals Screenings to include cognitive, depression, and falls Referrals and appointments  No orders of the defined types were placed in this encounter.  In addition, I have reviewed and discussed with patient certain preventive  protocols,  quality metrics, and best practice recommendations. A written personalized care plan for preventive services as well as general preventive health recommendations were provided to patient.  Bobbetta Degree CMA   01/26/2024   Return in 1 year (on 01/25/2025).  After Visit Summary: (In Person-Printed) AVS printed and given to the patient  Nurse Notes: Non-Face to Face or Face to Face _30_minute visit Encounter

## 2024-01-26 NOTE — Patient Instructions (Signed)
 Health Maintenance, Male  Adopting a healthy lifestyle and getting preventive care are important in promoting health and wellness. Ask your health care provider about:  The right schedule for you to have regular tests and exams.  Things you can do on your own to prevent diseases and keep yourself healthy.  What should I know about diet, weight, and exercise?  Eat a healthy diet    Eat a diet that includes plenty of vegetables, fruits, low-fat dairy products, and lean protein.  Do not eat a lot of foods that are high in solid fats, added sugars, or sodium.  Maintain a healthy weight  Body mass index (BMI) is a measurement that can be used to identify possible weight problems. It estimates body fat based on height and weight. Your health care provider can help determine your BMI and help you achieve or maintain a healthy weight.  Get regular exercise  Get regular exercise. This is one of the most important things you can do for your health. Most adults should:  Exercise for at least 150 minutes each week. The exercise should increase your heart rate and make you sweat (moderate-intensity exercise).  Do strengthening exercises at least twice a week. This is in addition to the moderate-intensity exercise.  Spend less time sitting. Even light physical activity can be beneficial.  Watch cholesterol and blood lipids  Have your blood tested for lipids and cholesterol at 67 years of age, then have this test every 5 years.  You may need to have your cholesterol levels checked more often if:  Your lipid or cholesterol levels are high.  You are older than 67 years of age.  You are at high risk for heart disease.  What should I know about cancer screening?  Many types of cancers can be detected early and may often be prevented. Depending on your health history and family history, you may need to have cancer screening at various ages. This may include screening for:  Colorectal cancer.  Prostate cancer.  Skin cancer.  Lung  cancer.  What should I know about heart disease, diabetes, and high blood pressure?  Blood pressure and heart disease  High blood pressure causes heart disease and increases the risk of stroke. This is more likely to develop in people who have high blood pressure readings or are overweight.  Talk with your health care provider about your target blood pressure readings.  Have your blood pressure checked:  Every 3-5 years if you are 24-52 years of age.  Every year if you are 3 years old or older.  If you are between the ages of 60 and 72 and are a current or former smoker, ask your health care provider if you should have a one-time screening for abdominal aortic aneurysm (AAA).  Diabetes  Have regular diabetes screenings. This checks your fasting blood sugar level. Have the screening done:  Once every three years after age 66 if you are at a normal weight and have a low risk for diabetes.  More often and at a younger age if you are overweight or have a high risk for diabetes.  What should I know about preventing infection?  Hepatitis B  If you have a higher risk for hepatitis B, you should be screened for this virus. Talk with your health care provider to find out if you are at risk for hepatitis B infection.  Hepatitis C  Blood testing is recommended for:  Everyone born from [birth year] through 1964.  Anyone  with known risk factors for hepatitis C.  Sexually transmitted infections (STIs)  You should be screened each year for STIs, including gonorrhea and chlamydia, if:  You are sexually active and are younger than 67 years of age.  You are older than 67 years of age and your health care provider tells you that you are at risk for this type of infection.  Your sexual activity has changed since you were last screened, and you are at increased risk for chlamydia or gonorrhea. Ask your health care provider if you are at risk.  Ask your health care provider about whether you are at high risk for HIV. Your health care provider  may recommend a prescription medicine to help prevent HIV infection. If you choose to take medicine to prevent HIV, you should first get tested for HIV. You should then be tested every 3 months for as long as you are taking the medicine.  Follow these instructions at home:  Alcohol use  Do not drink alcohol if your health care provider tells you not to drink.  If you drink alcohol:  Limit how much you have to 0-2 drinks a day.  Know how much alcohol is in your drink. In the [place] one drink equals one 12 oz bottle of beer (355 mL), one 5 oz glass of wine (148 mL), or one 1 oz glass of hard liquor (44 mL).  Lifestyle  Do not use any products that contain nicotine or tobacco. These products include cigarettes, chewing tobacco, and vaping devices, such as e-cigarettes. If you need help quitting, ask your health care provider.  Do not use street drugs.  Do not share needles.  Ask your health care provider for help if you need support or information about quitting drugs.  General instructions  Schedule regular health, dental, and eye exams.  Stay current with your vaccines.  Tell your health care provider if:  You often feel depressed.  You have ever been abused or do not feel safe at home.  Summary  Adopting a healthy lifestyle and getting preventive care are important in promoting health and wellness.  Follow your health care provider's instructions about healthy diet, exercising, and getting tested or screened for diseases.  Follow your health care provider's instructions on monitoring your cholesterol and blood pressure.  This information is not intended to replace advice given to you by your health care provider. Make sure you discuss any questions you have with your health care provider.  Document Revised: 07/24/2020 Document Reviewed: 07/24/2020  Elsevier Patient Education  2024 [employer].

## 2024-01-28 ENCOUNTER — Ambulatory Visit

## 2024-02-01 ENCOUNTER — Ambulatory Visit: Payer: Self-pay | Admitting: Family Medicine

## 2024-03-26 ENCOUNTER — Telehealth: Payer: Self-pay

## 2024-03-26 NOTE — Telephone Encounter (Signed)
 Therapy plan discontinued - patient no longer being treated at [facility]  Sherry Pennant, PharmD, MPH, BCPS, CPP Clinical Pharmacist

## 2024-03-26 NOTE — Telephone Encounter (Signed)
 Auth Submission: NO AUTH NEEDED Site of care: Site of care: [facility] Payer: Medicare A/B with AARP supplement Medication & CPT/J Code(s) submitted: Remicade  (Infliximab ) J1745 Diagnosis Code:  Route of submission (phone, fax, portal):  Phone # Fax # Auth type: Buy/Bill PB Units/visits requested: 900mg  x 6 doses Reference number:  Approval from: 03/26/24 to 04/17/25

## 2024-03-26 NOTE — Addendum Note (Signed)
 Addended by: DAYNE SHERRY RAMAN on: 03/26/2024 03:02 PM   Modules accepted: Orders

## 2024-04-27 ENCOUNTER — Ambulatory Visit: Admitting: Family Medicine
